# Patient Record
Sex: Female | Born: 1945 | Race: White | Hispanic: No | Smoking: Former smoker
Health system: Southern US, Community
[De-identification: ages and names within clinical notes are randomized; demographics above are authoritative.]

## PROBLEM LIST (undated history)

## (undated) ENCOUNTER — Emergency Department (HOSPITAL_COMMUNITY): Payer: Medicare Other | Source: Home / Self Care

## (undated) DIAGNOSIS — T1491XA Suicide attempt, initial encounter: Secondary | ICD-10-CM

## (undated) DIAGNOSIS — IMO0002 Reserved for concepts with insufficient information to code with codable children: Secondary | ICD-10-CM

## (undated) DIAGNOSIS — E039 Hypothyroidism, unspecified: Secondary | ICD-10-CM

## (undated) DIAGNOSIS — I779 Disorder of arteries and arterioles, unspecified: Secondary | ICD-10-CM

## (undated) DIAGNOSIS — M26629 Arthralgia of temporomandibular joint, unspecified side: Secondary | ICD-10-CM

## (undated) DIAGNOSIS — G43909 Migraine, unspecified, not intractable, without status migrainosus: Secondary | ICD-10-CM

## (undated) DIAGNOSIS — I998 Other disorder of circulatory system: Secondary | ICD-10-CM

## (undated) DIAGNOSIS — E782 Mixed hyperlipidemia: Secondary | ICD-10-CM

## (undated) DIAGNOSIS — G8929 Other chronic pain: Secondary | ICD-10-CM

## (undated) DIAGNOSIS — J90 Pleural effusion, not elsewhere classified: Secondary | ICD-10-CM

## (undated) DIAGNOSIS — I639 Cerebral infarction, unspecified: Secondary | ICD-10-CM

## (undated) DIAGNOSIS — M549 Dorsalgia, unspecified: Secondary | ICD-10-CM

## (undated) DIAGNOSIS — M1990 Unspecified osteoarthritis, unspecified site: Secondary | ICD-10-CM

## (undated) DIAGNOSIS — L0390 Cellulitis, unspecified: Secondary | ICD-10-CM

## (undated) DIAGNOSIS — F32A Depression, unspecified: Secondary | ICD-10-CM

## (undated) DIAGNOSIS — I70229 Atherosclerosis of native arteries of extremities with rest pain, unspecified extremity: Secondary | ICD-10-CM

## (undated) DIAGNOSIS — Z9289 Personal history of other medical treatment: Secondary | ICD-10-CM

## (undated) DIAGNOSIS — I10 Essential (primary) hypertension: Secondary | ICD-10-CM

## (undated) DIAGNOSIS — I739 Peripheral vascular disease, unspecified: Secondary | ICD-10-CM

## (undated) DIAGNOSIS — I509 Heart failure, unspecified: Secondary | ICD-10-CM

## (undated) DIAGNOSIS — I255 Ischemic cardiomyopathy: Secondary | ICD-10-CM

## (undated) DIAGNOSIS — F329 Major depressive disorder, single episode, unspecified: Secondary | ICD-10-CM

## (undated) DIAGNOSIS — I872 Venous insufficiency (chronic) (peripheral): Secondary | ICD-10-CM

## (undated) DIAGNOSIS — J189 Pneumonia, unspecified organism: Secondary | ICD-10-CM

## (undated) DIAGNOSIS — G459 Transient cerebral ischemic attack, unspecified: Secondary | ICD-10-CM

## (undated) DIAGNOSIS — I214 Non-ST elevation (NSTEMI) myocardial infarction: Secondary | ICD-10-CM

## (undated) DIAGNOSIS — E119 Type 2 diabetes mellitus without complications: Secondary | ICD-10-CM

## (undated) DIAGNOSIS — Z9981 Dependence on supplemental oxygen: Secondary | ICD-10-CM

## (undated) DIAGNOSIS — I2510 Atherosclerotic heart disease of native coronary artery without angina pectoris: Secondary | ICD-10-CM

## (undated) DIAGNOSIS — I5032 Chronic diastolic (congestive) heart failure: Secondary | ICD-10-CM

## (undated) HISTORY — DX: Arthralgia of temporomandibular joint, unspecified side: M26.629

## (undated) HISTORY — DX: Chronic diastolic (congestive) heart failure: I50.32

## (undated) HISTORY — PX: COLONOSCOPY W/ BIOPSIES AND POLYPECTOMY: SHX1376

## (undated) HISTORY — DX: Venous insufficiency (chronic) (peripheral): I87.2

## (undated) HISTORY — DX: Unspecified osteoarthritis, unspecified site: M19.90

## (undated) HISTORY — PX: DILATION AND CURETTAGE OF UTERUS: SHX78

## (undated) HISTORY — DX: Disorder of arteries and arterioles, unspecified: I77.9

## (undated) HISTORY — PX: APPENDECTOMY: SHX54

## (undated) HISTORY — DX: Atherosclerosis of native arteries of extremities with rest pain, unspecified extremity: I70.229

## (undated) HISTORY — PX: LOWER EXTREMITY ANGIOGRAM: SHX5955

## (undated) HISTORY — PX: WRIST FRACTURE SURGERY: SHX121

## (undated) HISTORY — DX: Type 2 diabetes mellitus without complications: E11.9

## (undated) HISTORY — DX: Depression, unspecified: F32.A

## (undated) HISTORY — PX: ABDOMINAL HYSTERECTOMY: SHX81

## (undated) HISTORY — DX: Cerebral infarction, unspecified: I63.9

## (undated) HISTORY — DX: Other disorder of circulatory system: I99.8

## (undated) HISTORY — PX: TOE SURGERY: SHX1073

## (undated) HISTORY — DX: Transient cerebral ischemic attack, unspecified: G45.9

## (undated) HISTORY — DX: Suicide attempt, initial encounter: T14.91XA

## (undated) HISTORY — DX: Atherosclerotic heart disease of native coronary artery without angina pectoris: I25.10

## (undated) HISTORY — DX: Heart failure, unspecified: I50.9

## (undated) HISTORY — DX: Mixed hyperlipidemia: E78.2

## (undated) HISTORY — PX: DEBRIDEMENT TOE: SUR397

## (undated) HISTORY — DX: Major depressive disorder, single episode, unspecified: F32.9

## (undated) HISTORY — PX: TUBAL LIGATION: SHX77

## (undated) HISTORY — PX: FRACTURE SURGERY: SHX138

## (undated) HISTORY — DX: Essential (primary) hypertension: I10

## (undated) HISTORY — PX: CHOLECYSTECTOMY: SHX55

## (undated) HISTORY — DX: Cellulitis, unspecified: L03.90

## (undated) HISTORY — DX: Non-ST elevation (NSTEMI) myocardial infarction: I21.4

## (undated) HISTORY — DX: Peripheral vascular disease, unspecified: I73.9

---

## 2000-11-06 ENCOUNTER — Ambulatory Visit (HOSPITAL_COMMUNITY): Admission: RE | Admit: 2000-11-06 | Discharge: 2000-11-06 | Payer: Self-pay | Admitting: Neurosurgery

## 2000-11-06 ENCOUNTER — Encounter: Payer: Self-pay | Admitting: Neurosurgery

## 2000-11-23 ENCOUNTER — Encounter: Admission: RE | Admit: 2000-11-23 | Discharge: 2000-11-23 | Payer: Self-pay | Admitting: Neurosurgery

## 2000-11-23 ENCOUNTER — Encounter: Payer: Self-pay | Admitting: Neurosurgery

## 2000-12-07 ENCOUNTER — Encounter: Payer: Self-pay | Admitting: Neurosurgery

## 2000-12-07 ENCOUNTER — Encounter: Admission: RE | Admit: 2000-12-07 | Discharge: 2000-12-07 | Payer: Self-pay | Admitting: Neurosurgery

## 2000-12-21 ENCOUNTER — Encounter: Payer: Self-pay | Admitting: Neurosurgery

## 2000-12-21 ENCOUNTER — Encounter: Admission: RE | Admit: 2000-12-21 | Discharge: 2000-12-21 | Payer: Self-pay | Admitting: Neurosurgery

## 2002-07-09 ENCOUNTER — Inpatient Hospital Stay (HOSPITAL_COMMUNITY): Admission: EM | Admit: 2002-07-09 | Discharge: 2002-07-10 | Payer: Self-pay | Admitting: Cardiology

## 2002-08-07 ENCOUNTER — Inpatient Hospital Stay (HOSPITAL_COMMUNITY): Admission: EM | Admit: 2002-08-07 | Discharge: 2002-08-11 | Payer: Self-pay | Admitting: Psychiatry

## 2002-08-10 DIAGNOSIS — T1491XA Suicide attempt, initial encounter: Secondary | ICD-10-CM

## 2002-08-10 HISTORY — DX: Suicide attempt, initial encounter: T14.91XA

## 2002-09-10 ENCOUNTER — Inpatient Hospital Stay (HOSPITAL_COMMUNITY): Admission: EM | Admit: 2002-09-10 | Discharge: 2002-09-15 | Payer: Self-pay | Admitting: Psychiatry

## 2009-01-09 HISTORY — PX: CAROTID ENDARTERECTOMY: SUR193

## 2009-08-09 DIAGNOSIS — I639 Cerebral infarction, unspecified: Secondary | ICD-10-CM

## 2009-08-09 HISTORY — DX: Cerebral infarction, unspecified: I63.9

## 2009-08-31 ENCOUNTER — Encounter: Payer: Self-pay | Admitting: Cardiology

## 2009-08-31 ENCOUNTER — Ambulatory Visit: Payer: Self-pay | Admitting: Vascular Surgery

## 2009-09-03 ENCOUNTER — Encounter: Payer: Self-pay | Admitting: Cardiology

## 2009-09-08 ENCOUNTER — Inpatient Hospital Stay (HOSPITAL_COMMUNITY): Admission: RE | Admit: 2009-09-08 | Discharge: 2009-09-09 | Payer: Self-pay | Admitting: Vascular Surgery

## 2009-09-08 ENCOUNTER — Encounter: Payer: Self-pay | Admitting: Vascular Surgery

## 2009-09-08 ENCOUNTER — Ambulatory Visit: Payer: Self-pay | Admitting: Vascular Surgery

## 2009-10-05 ENCOUNTER — Ambulatory Visit: Payer: Self-pay | Admitting: Vascular Surgery

## 2009-10-12 ENCOUNTER — Encounter: Payer: Self-pay | Admitting: Cardiology

## 2009-10-13 ENCOUNTER — Ambulatory Visit: Payer: Self-pay | Admitting: Vascular Surgery

## 2009-10-13 ENCOUNTER — Inpatient Hospital Stay (HOSPITAL_COMMUNITY): Admission: RE | Admit: 2009-10-13 | Discharge: 2009-10-14 | Payer: Self-pay | Admitting: Vascular Surgery

## 2009-10-13 ENCOUNTER — Encounter: Payer: Self-pay | Admitting: Cardiology

## 2009-10-13 ENCOUNTER — Encounter: Payer: Self-pay | Admitting: Vascular Surgery

## 2009-10-14 ENCOUNTER — Encounter: Payer: Self-pay | Admitting: Cardiology

## 2009-10-23 ENCOUNTER — Encounter: Payer: Self-pay | Admitting: Cardiology

## 2009-10-24 ENCOUNTER — Ambulatory Visit: Payer: Self-pay | Admitting: *Deleted

## 2009-10-24 ENCOUNTER — Encounter: Payer: Self-pay | Admitting: Cardiology

## 2009-10-25 ENCOUNTER — Encounter: Payer: Self-pay | Admitting: Cardiology

## 2009-10-26 ENCOUNTER — Ambulatory Visit: Payer: Self-pay | Admitting: Cardiovascular Disease

## 2009-10-26 ENCOUNTER — Inpatient Hospital Stay (HOSPITAL_COMMUNITY): Admission: AD | Admit: 2009-10-26 | Discharge: 2009-10-28 | Payer: Self-pay | Admitting: Internal Medicine

## 2009-10-26 ENCOUNTER — Encounter: Payer: Self-pay | Admitting: Cardiology

## 2009-10-28 ENCOUNTER — Encounter: Payer: Self-pay | Admitting: Cardiology

## 2009-11-02 ENCOUNTER — Ambulatory Visit: Payer: Self-pay | Admitting: Vascular Surgery

## 2009-11-04 ENCOUNTER — Encounter: Payer: Self-pay | Admitting: Cardiology

## 2009-11-05 ENCOUNTER — Encounter: Payer: Self-pay | Admitting: Cardiology

## 2009-11-09 ENCOUNTER — Encounter: Payer: Self-pay | Admitting: Cardiology

## 2009-11-11 ENCOUNTER — Ambulatory Visit: Payer: Self-pay | Admitting: Cardiology

## 2009-11-11 DIAGNOSIS — F17200 Nicotine dependence, unspecified, uncomplicated: Secondary | ICD-10-CM

## 2009-11-11 DIAGNOSIS — E782 Mixed hyperlipidemia: Secondary | ICD-10-CM

## 2009-11-22 ENCOUNTER — Encounter: Payer: Self-pay | Admitting: Cardiology

## 2009-12-20 ENCOUNTER — Ambulatory Visit: Payer: Self-pay | Admitting: Cardiology

## 2009-12-22 ENCOUNTER — Encounter: Payer: Self-pay | Admitting: Cardiology

## 2010-01-07 ENCOUNTER — Encounter (INDEPENDENT_AMBULATORY_CARE_PROVIDER_SITE_OTHER): Payer: Self-pay | Admitting: *Deleted

## 2010-01-07 ENCOUNTER — Encounter: Payer: Self-pay | Admitting: Cardiology

## 2010-01-14 DIAGNOSIS — I10 Essential (primary) hypertension: Secondary | ICD-10-CM | POA: Insufficient documentation

## 2010-02-08 NOTE — Op Note (Signed)
Summary: Operative Report/ [facility]  Operative Report/ [facility]   Imported By: Bartholomew Boards 11/11/2009 12:13:00  _____________________________________________________________________  External Attachment:    Type:   Image     Comment:   External Document

## 2010-02-08 NOTE — Letter (Signed)
Summary: Discharge Summary  Discharge Summary   Imported By: Bartholomew Boards 11/11/2009 12:14:40  _____________________________________________________________________  External Attachment:    Type:   Image     Comment:   External Document

## 2010-02-08 NOTE — Miscellaneous (Signed)
Summary: Rehab Report/ CARDIAC REHAB PROGRESS REPORT  Rehab Report/ CARDIAC REHAB PROGRESS REPORT   Imported By: Bartholomew Boards 11/22/2009 14:36:01  _____________________________________________________________________  External Attachment:    Type:   Image     Comment:   External Document

## 2010-02-08 NOTE — Assessment & Plan Note (Signed)
Summary: [facility] PER M. SPENCER   Visit Type:  Follow-up Primary [name] [name]:  Dr. Jerene Bears   History of Present Illness: 65 year old woman presents for office followup. She is a previous patient of Dr. Dannielle Burn. I saw her in consultation in [place] in October in the setting of a non-ST elevation myocardial infarction with diastolic heart failure and prior documentation of nonobstructive CAD in 2004 with active cardiac risk factors. She had undergone recent bilateral carotid endarterectomies in September and October by Dr. Kellie Simmering. She was transferred to [facility] for further evaluation. Cardiac catheterization demonstrated diffuse multivessel disease that was felt to be best managed medically.  She saw Dr. Kellie Simmering in followup in late October.  She states that she is feeling "good." No angina, NYHA class II dyspnea on exertion. She states she weighs daily, weights only fluctuate by a few pounds. She has had no orthopnea or PND. No lower extremity edema. She reports compliance with her medications.  She quit smoking back in October. Has had no major urges. Seems fairly resolved.  Preventive Screening-Counseling & Management  Alcohol-Tobacco     Smoking Status: current     Packs/Day: 1&1/2-2 PPD     Year Started: 1956     Year Quit: second week of October had last cigarette     Pack years: 50  Current Medications (verified): 1)  Bupropion Hcl 150 Mg Xr24h-Tab (Bupropion Hcl) .... Take 1 Tablet By Mouth Once A Day 2)  Plavix 75 Mg Tabs (Clopidogrel Bisulfate) .... Take 1 Tablet By Mouth Once A Day 3)  Furosemide 40 Mg Tabs (Furosemide) .... Take 1 Tablet By Mouth Two Times A Day 4)  Lipitor 80 Mg Tabs (Atorvastatin Calcium) .... Take 1 Tablet By Mouth Once A Day 5)  Nicoderm Cq 21 Mg/24hr Pt24 (Nicotine) .... Apply Patch Every 24 Hours 6)  Nitrostat 0.4 Mg Subl (Nitroglycerin) .... Use As Needed Chest Pain 7)  Aspirin 325 Mg Tabs (Aspirin) .... Take 1 Tablet By Mouth Once A  Day 8)  Glimepiride 4 Mg Tabs (Glimepiride) .... Take 1 Tablet By Mouth Two Times A Day 9)  Lantus 100 Unit/ml Soln (Insulin Glargine) .... Use As Directed 10)  Lexapro 10 Mg Tabs (Escitalopram Oxalate) .... Take 1 Tablet By Mouth Once A Day 11)  Lisinopril 20 Mg Tabs (Lisinopril) .... Take 1 Tablet By Mouth Once A Day 12)  Metformin Hcl 500 Mg Tabs (Metformin Hcl) .... Take 1 Tablet By Mouth Two Times A Day 13)  Novolog 100 Unit/ml Soln (Insulin Aspart) .... Use As Directed  Allergies (verified): 1)  ! Sulfa  Comments:  Nurse/Medical Assistant: The patient's medication bottles and allergies were reviewed with the patient and were updated in the Medication and Allergy Lists.  Past History:  Family History: Last updated: 2009/11/17 Father: died age 38 with MI Mother: diabetes, hypertension Siblings: sister with CHF, brother with lung cancer  Social History: Last updated: 11/17/2009 Tobacco Use - Yes Alcohol Use - yes (occasional)  Past Medical History: CAD - multivessel Diastolic CHF, LVEF 56% Diabetes Type 2 Hyperlipidemia Hypertension PVD - bilateral carotid endarterectomies Depression Recurrent cellulitis of the legs Arthritis  Past Surgical History: Appendectomy Cholecystectomy Hysterectomy Bilateral carotid endarterectomies - Dr. Kellie Simmering  Family History: Father: died age 15 with MI Mother: diabetes, hypertension Siblings: sister with CHF, brother with lung cancer  Social History: Tobacco Use - Yes Alcohol Use - yes (occasional) Smoking Status:  current Packs/Day:  1&1/2-2 PPD Pack years:  50  Review of  Systems  The patient denies anorexia, fever, weight loss, chest pain, syncope, dyspnea on exertion, peripheral edema, melena, and hematochezia.         Otherwise reviewed and negative.  Vital Signs:  Patient profile:   65 year old female Height:      67 inches Weight:      197 pounds BMI:     30.97 Pulse rate:   71 / minute BP sitting:   111 /  61  (right arm) Cuff size:   large  Vitals Entered By: Georgina Peer (November 11, 2009 2:28 PM)  Nutrition Counseling: Patient's BMI is greater than 25 and therefore counseled on weight management options.  Physical Exam  Additional Exam:  Chronically ill-appearing overweight woman in no acute distress. HEENT: Conjunctiva and lids normal, oropharynx with poor dentition. Neck: Supple, bilateral carotid endarterectomy scars, no thyromegaly. Lungs: Decreased breath sounds, no rales or wheezing. Nonlabored. Cardiac: Regular rate and rhythm, no S3 gallop. Abdomen: Soft, nontender, bowel sounds present. Skin: Warm and dry, stasis noted distally.  Extremities: No pitting edema, distal pulses one plus. Musculoskeletal: No kyphosis. Neuropsychiatric: Alert and oriented x3, affect grossly appropriate.   Echocardiogram  Procedure date:  10/25/2009  Findings:      Normal LV chamber size with mild to moderate LVH, increased LV filling pressures, LVEF 55% with slight hypokinesis of the septum, mild left atrial enlargement, mild mitral regurgitation, trace tricuspid regurgitation.  Cardiac Cath  Procedure date:  10/27/2009  Findings:       PROCEDURAL FINDINGS:  Right atrial pressure mean of 6.  Right   ventricular pressure is 40/9.  Pulmonary artery pressure is 44/12 with a   mean of 29.  Pulmonary capillary wedge pressure is a mean of 14.      Oxygen saturations:  Pulmonary artery 62%, central aorta is 91%.   Cardiac output by the Fick method is 4.4 liters per minute.  Cardiac   index is 3 liters per minute per sq. m.      Left ventriculography shows mild global hypokinesis with an LVEF   estimated at 50%.  There are no regional wall motion abnormalities   visualized.      Coronary angiography:  The LAD has a separate ostium from the   circumflex.  The LAD reaches the LV apex.  The vessel was diffusely   diseased.  There is 60% proximal stenosis, 70% diffuse mid stenosis.   There  is a second diagonal branch that supplies a large territory of   myocardium.  It is also diffusely diseased and has 80% stenosis.      Left circumflex:  The left circumflex is small and diffusely diseased.   The proximal circumflex has 70% tandem stenoses.  The distal circumflex   has a 75% stenosis.  The entire vessel is of small caliber.  There is   significant pressure dampening with a catheter into the circumflex.      Right coronary artery:  The right coronary artery is also small and   diffusely diseased.  There is 60% proximal stenosis, 50% mid stenosis.   The distal vessel is patent gives off a PDA and posterolateral branch.   The right coronary artery also had significant pressure dampening with a   catheter into its ostium.  Impression & Recommendations:  Problem # 1:  CORONARY ATHEROSCLEROSIS NATIVE CORONARY ARTERY (ICD-414.01)  Multivessel disease as noted above, felt to be best managed medically. She is status post non-ST elevation myocardial infarction following  recent carotid endarterectomies. At this point symptomatically stable. I would like to see her back over the next 6 weeks, sooner if needed. No medication changes were made today.  Her updated medication list for this problem includes:    Plavix 75 Mg Tabs (Clopidogrel bisulfate) .[name]... Take 1 tablet by mouth once a day    Nitrostat 0.4 Mg Subl (Nitroglycerin) ..... Use as needed chest pain    Aspirin 325 Mg Tabs (Aspirin) .[name]... Take 1 tablet by mouth once a day    Lisinopril 20 Mg Tabs (Lisinopril) .[name]... Take 1 tablet by mouth once a day  Problem # 2:  CHRONIC DIASTOLIC HEART FAILURE (UTM-546.50)  Patient appears relatively euvolemic. Breathing status is stable. Weight has had no major fluctuations. Continue present diuretic regimen. Followup BMET prior to next visit.  Her updated medication list for this problem includes:    Plavix 75 Mg Tabs (Clopidogrel bisulfate) .[name]... Take 1 tablet by mouth once a day     Furosemide 40 Mg Tabs (Furosemide) .[name]... Take 1 tablet by mouth two times a day    Nitrostat 0.4 Mg Subl (Nitroglycerin) ..... Use as needed chest pain    Aspirin 325 Mg Tabs (Aspirin) .[name]... Take 1 tablet by mouth once a day    Lisinopril 20 Mg Tabs (Lisinopril) .[name]... Take 1 tablet by mouth once a day  Problem # 3:  TOBACCO ABUSE (ICD-305.1)  Patient quit smoking back in October. Seems to be handling urges well, good resolve. Has used nicotine patches.  Problem # 4:  MIXED HYPERLIPIDEMIA (ICD-272.2)  Followup fasting lipid profile and liver function tests for her next visit  Her updated medication list for this problem includes:    Lipitor 80 Mg Tabs (Atorvastatin calcium) .[name]... Take 1 tablet by mouth once a day  Future Orders: T-Hepatic Function [id number]) ... 12/09/2009 T-Lipid Profile [id number]) ... 49/67/5916 T-Basic Metabolic Panel ([id number]) ... 12/09/2009  Patient Instructions: 1)  Follow up with Dr. Domenic Polite on Monday, December 20, 2009 at 10:40pm. 2)  Your physician recommends that you go to the [facility] for lab work: DO [place] DECEMBER 12 OFFICE VISIT. Do not eat or drink after midnight.

## 2010-02-08 NOTE — Miscellaneous (Signed)
Summary: Home Care Report/ [place] Report/ [facility]   Imported By: Bartholomew Boards 11/12/2009 15:03:02  _____________________________________________________________________  External Attachment:    Type:   Image     Comment:   External Document

## 2010-02-08 NOTE — Consult Note (Signed)
Summary: CARDIOLOGY CONSULT/ [place] CONSULT/ [place]   Imported By: Delfino Lovett 11/11/2009 11:54:00  _____________________________________________________________________  External Attachment:    Type:   Image     Comment:   External Document

## 2010-02-08 NOTE — Miscellaneous (Signed)
Summary: Rehab Report/ FAXED CARDIAC REHAB REFERRAL  Rehab Report/ FAXED CARDIAC REHAB REFERRAL   Imported By: Bartholomew Boards 11/12/2009 14:52:15  _____________________________________________________________________  External Attachment:    Type:   Image     Comment:   External Document

## 2010-02-08 NOTE — Progress Notes (Signed)
Summary: Office Visit/ [facility] OFFICE VISIT  Office Visit/ [facility] OFFICE VISIT   Imported By: Bartholomew Boards 11/11/2009 12:09:29  _____________________________________________________________________  External Attachment:    Type:   Image     Comment:   External Document

## 2010-02-08 NOTE — Miscellaneous (Signed)
Summary: Home Care Report/ [place] Report/ [facility]   Imported By: Bartholomew Boards 11/15/2009 13:59:44  _____________________________________________________________________  External Attachment:    Type:   Image     Comment:   External Document

## 2010-02-10 NOTE — Assessment & Plan Note (Signed)
Summary: 6 WK F/U PER REMINDER-JM   Visit Type:  Follow-up Primary Provider:  Dr. Jerene Bears   History of Present Illness: 65 year old woman presents for followup. She was seen back in November. Reports no problems with recurrent angina or progressive shortness of breath. Main complaint is of lower back and leg pain, with plans to see an orthopedic physician in [place] soon.  She has tolerated her medication adjustments aimed at better control of hypertension and angina symptoms.  Followup labs from 30 December showed BUN 23, creatinine 0.9, sodium 140, potassium 3.9, AST 22, ALT 20, cholesterol 129, triglycerides 146, HDL 36, LDL 64. I reviewed these with her today.  No reported palpitations, no syncope. No significant lower extremity edema.  Preventive Screening-Counseling & Management  Alcohol-Tobacco     Smoking Status: quit     Year Quit: 10/2009  Current Medications (verified): 1)  Bupropion Hcl 150 Mg Xr24h-Tab (Bupropion Hcl) .... Take 1 Tablet By Mouth Once A Day 2)  Plavix 75 Mg Tabs (Clopidogrel Bisulfate) .... Take 1 Tablet By Mouth Once A Day 3)  Furosemide 40 Mg Tabs (Furosemide) .... Take 1 Tablet By Mouth Two Times A Day 4)  Lipitor 80 Mg Tabs (Atorvastatin Calcium) .... Take 1 Tablet By Mouth Once A Day 5)  Nicoderm Cq 21 Mg/24hr Pt24 (Nicotine) .... Apply Patch Every 24 Hours 6)  Nitrostat 0.4 Mg Subl (Nitroglycerin) .... Use As Needed Chest Pain 7)  Aspirin 325 Mg Tabs (Aspirin) .... Take 1 Tablet By Mouth Once A Day 8)  Glimepiride 4 Mg Tabs (Glimepiride) .... Take 1 Tablet By Mouth Two Times A Day 9)  Lantus 100 Unit/ml Soln (Insulin Glargine) .... Use As Directed 10)  Lexapro 10 Mg Tabs (Escitalopram Oxalate) .... Take 1 Tablet By Mouth Once A Day 11)  Lisinopril 40 Mg Tabs (Lisinopril) .... Take 1 Tablet By Mouth Once A Day 12)  Metformin Hcl 500 Mg Tabs (Metformin Hcl) .... Take 1 Tablet By Mouth Two Times A Day 13)  Novolog 100 Unit/ml Soln (Insulin  Aspart) .... Use As Directed  Allergies (verified): 1)  ! Sulfa  Comments:  Nurse/Medical Assistant: The patient is currently on medications but does not know the name or dosage at this time. Instructed to contact our office with details. Will update medication list at that time.  Past History:  Social History: Last updated: 01/14/2010 Tobacco Use - Yes Alcohol Use - yes (occasional) Smoking Status:  current Packs/Day:  1&1/2-2 PPD Pack years:  44     Past Medical History: CAD - multivessel Diastolic CHF, LVEF 82% Diabetes Type 2 Hyperlipidemia Hypertension PVD - bilateral carotid endarterectomies Depression Recurrent cellulitis of the legs Arthritis  Past Surgical History: Appendectomy Cholecystectomy Hysterectomy Bilateral carotid endarterectomies - Dr. Kellie Simmering     Family History: Father: died age 80 with MI Mother: diabetes, hypertension Siblings: sister with CHF, brother with lung cancer  Social History: Tobacco Use - Yes Alcohol Use - yes (occasional) Smoking Status:  current Packs/Day:  1&1/2-2 PPD Pack years:  50    Smoking Status:  quit  Review of Systems  The patient denies anorexia, fever, weight loss, chest pain, syncope, dyspnea on exertion, peripheral edema, headaches, hemoptysis, melena, and hematochezia.         Otherwise reviewed and negative except as outlined.  Vital Signs:  Patient profile:   65 year old female Height:      67 inches Weight:      204 pounds Pulse rate:   69 /  minute BP sitting:   163 / 72  (left arm) Cuff size:   large  Vitals Entered By: Georgina Peer (January 14, 2010 1:13 PM)  Physical Exam  Additional Exam:  Chronically ill-appearing overweight woman in no acute distress. HEENT: Conjunctiva and lids normal, oropharynx with poor dentition. Neck: Supple, bilateral carotid endarterectomy scars, very soft left bruit, no thyromegaly. Lungs: Decreased breath sounds, no rales or wheezing. Nonlabored. Cardiac:  Regular rate and rhythm, no S3 gallop. Abdomen: Soft, nontender, bowel sounds present. Skin: Warm and dry, stasis noted distally.  Extremities: No pitting edema, distal pulses one plus. Musculoskeletal: No kyphosis. Neuropsychiatric: Alert and oriented x3, affect grossly appropriate.   Prior Report Reviewed for Cardiac Cath:  Findings: 10/27/2009  PROCEDURAL FINDINGS:  Right atrial pressure mean of 6.  Right   ventricular pressure is 40/9.  Pulmonary artery pressure is 44/12 with a   mean of 29.  Pulmonary capillary wedge pressure is a mean of 14.      Oxygen saturations:  Pulmonary artery 62%, central aorta is 91%.   Cardiac output by the Fick method is 4.4 liters per minute.  Cardiac   index is 3 liters per minute per sq. m.      Left ventriculography shows mild global hypokinesis with an LVEF   estimated at 50%.  There are no regional wall motion abnormalities   visualized.      Coronary angiography:  The LAD has a separate ostium from the   circumflex.  The LAD reaches the LV apex.  The vessel was diffusely   diseased.  There is 60% proximal stenosis, 70% diffuse mid stenosis.   There is a second diagonal branch that supplies a large territory of   myocardium.  It is also diffusely diseased and has 80% stenosis.      Left circumflex:  The left circumflex is small and diffusely diseased.   The proximal circumflex has 70% tandem stenoses.  The distal circumflex   has a 75% stenosis.  The entire vessel is of small caliber.  There is   significant pressure dampening with a catheter into the circumflex.      Right coronary artery:  The right coronary artery is also small and   diffusely diseased.  There is 60% proximal stenosis, 50% mid stenosis.   The distal vessel is patent gives off a PDA and posterolateral branch.   The right coronary artery also had significant pressure dampening with a   catheter into its ostium.  Comments:    Impression & Recommendations:  Problem  # 1:  CORONARY ATHEROSCLEROSIS NATIVE CORONARY ARTERY (ICD-414.01)  Symptomatically stable on medical therapy. Plan followup in 3 months.  Her updated medication list for this problem includes:    Plavix 75 Mg Tabs (Clopidogrel bisulfate) .[name]... Take 1 tablet by mouth once a day    Nitrostat 0.4 Mg Subl (Nitroglycerin) ..... Use as needed chest pain    Aspirin 325 Mg Tabs (Aspirin) .[name]... Take 1 tablet by mouth once a day    Lisinopril 40 Mg Tabs (Lisinopril) .[name]... Take 1 tablet by mouth once a day  Her updated medication list for this problem includes:    Plavix 75 Mg Tabs (Clopidogrel bisulfate) .[name]... Take 1 tablet by mouth once a day    Nitrostat 0.4 Mg Subl (Nitroglycerin) ..... Use as needed chest pain    Aspirin 325 Mg Tabs (Aspirin) .[name]... Take 1 tablet by mouth once a day    Lisinopril 40 Mg Tabs (  Lisinopril) .[name]... Take 1 tablet by mouth once a day  Future Orders: T-Basic Metabolic Panel ([id number]) ... 01/28/2010  Problem # 2:  ESSENTIAL HYPERTENSION, BENIGN (ICD-401.1)  Not yet optimally controlled. Advance lisinopril to 40 mg daily, followup BMET in 2 weeks.  Her updated medication list for this problem includes:    Furosemide 40 Mg Tabs (Furosemide) .[name]... Take 1 tablet by mouth two times a day    Aspirin 325 Mg Tabs (Aspirin) .[name]... Take 1 tablet by mouth once a day    Lisinopril 40 Mg Tabs (Lisinopril) .[name]... Take 1 tablet by mouth once a day  Problem # 3:  CHRONIC DIASTOLIC HEART FAILURE (PDF-009.20)  No progressive shortness of breath. Patient appears euvolemic.  Her updated medication list for this problem includes:    Plavix 75 Mg Tabs (Clopidogrel bisulfate) .[name]... Take 1 tablet by mouth once a day    Furosemide 40 Mg Tabs (Furosemide) .[name]... Take 1 tablet by mouth two times a day    Nitrostat 0.4 Mg Subl (Nitroglycerin) ..... Use as needed chest pain    Aspirin 325 Mg Tabs (Aspirin) .[name]... Take 1 tablet by mouth once a day    Lisinopril 40 Mg Tabs (Lisinopril) .[name]...  Take 1 tablet by mouth once a day  Problem # 4:  MIXED HYPERLIPIDEMIA (ICD-272.2)  Lipids well-controlled at last check.  Her updated medication list for this problem includes:    Lipitor 80 Mg Tabs (Atorvastatin calcium) .[name]... Take 1 tablet by mouth once a day  Patient Instructions: 1)  Increase Lisinopril to 44m daily  2)  Labs:  BMET - do in 2 weeks after change above 3)  Follow up in  3 months Prescriptions: LISINOPRIL 40 MG TABS (LISINOPRIL) Take 1 tablet by mouth once a day  #30 x 6   Entered by:   GLovina Reach LPN   Authorized by:   SBeckie Salts MD, [facility]  Signed by:   GLovina Reach LPN on 004/15/9301  Method used:   Electronically to        [facility])       [address]      [place]  [address]      Ph: [phone]      Fax: [phone]  RxID:   [id number]

## 2010-02-10 NOTE — Miscellaneous (Signed)
Summary: Rehab Report/ CARDIAC REHAB PROGRESS REPORT  Rehab Report/ CARDIAC REHAB PROGRESS REPORT   Imported By: Bartholomew Boards 12/29/2009 14:08:49  _____________________________________________________________________  External Attachment:    Type:   Image     Comment:   External Document

## 2010-02-10 NOTE — Letter (Signed)
Summary: Generic Doctor, general practice at [place]. [address] [place], [place] [address]   Phone: [phone]  Fax: [phone]        January 07, 2010 MRN: [id number]    [facility] [address] [place], [place]  [address]    Dear Ms. Mones,   According to our records, you are due for lab work before your January 6th office visit.   Please take the enclosed order to the [facility] before this appointment date to have lab work done.   Do not eat or drink after midnight.       Sincerely,  Gurney Maxin, RN, BSN  This letter has been electronically signed by your physician.

## 2010-02-21 ENCOUNTER — Encounter (INDEPENDENT_AMBULATORY_CARE_PROVIDER_SITE_OTHER): Payer: Self-pay | Admitting: *Deleted

## 2010-02-28 ENCOUNTER — Encounter: Payer: Self-pay | Admitting: Cardiology

## 2010-03-02 NOTE — Letter (Signed)
Summary: Generic Doctor, general practice at [place]. [address]   [place], [place] [address]   Phone: [phone]  Fax: [phone]        February 21, 2010 MRN: [id number]    [facility] [place], [place], [place]  [address]    Dear Ms. Bugbee,  You were asked to have lab work done in January. However, it does not appear this has been done yet.  Please, take the enclosed order to the [facility] at your earliest convenience to have this done. If you will not be able to do your lab work at this time, please notify our office so that we can properly document this in  your chart.         Sincerely,  Gurney Maxin, RN, BSN  This letter has been electronically signed by your physician.

## 2010-03-17 NOTE — Miscellaneous (Signed)
Summary: Rehab Report/ CARDIAC REHAB DISCHARGE SUMMARY  Rehab Report/ CARDIAC REHAB DISCHARGE SUMMARY   Imported By: Bartholomew Boards 03/08/2010 16:43:07  _____________________________________________________________________  External Attachment:    Type:   Image     Comment:   External Document

## 2010-03-23 LAB — BASIC METABOLIC PANEL
BUN: 20 mg/dL (ref 6–23)
CO2: 31 mEq/L (ref 19–32)
CO2: 32 mEq/L (ref 19–32)
Calcium: 8.8 mg/dL (ref 8.4–10.5)
Calcium: 9.4 mg/dL (ref 8.4–10.5)
Chloride: 98 mEq/L (ref 96–112)
Creatinine, Ser: 0.76 mg/dL (ref 0.4–1.2)
Creatinine, Ser: 0.87 mg/dL (ref 0.4–1.2)
GFR calc Af Amer: >60 mL/min (ref >60)
GFR calc non Af Amer: >60 mL/min (ref >60)
GFR calc non Af Amer: >60 mL/min (ref >60)
Glucose, Bld: 129 mg/dL — ABNORMAL HIGH (ref 70–99)
Glucose, Bld: 62 mg/dL — ABNORMAL LOW (ref 70–99)
Potassium: 4 mEq/L (ref 3.5–5.1)
Sodium: 139 mEq/L (ref 135–145)
Sodium: 133 mEq/L — ABNORMAL LOW (ref 135–145)

## 2010-03-23 LAB — CBC
HCT: 33.8 % — ABNORMAL LOW (ref 36.0–46.0)
Hemoglobin: 11.1 g/dL — ABNORMAL LOW (ref 12.0–15.0)
MCH: 27.1 pg (ref 26.0–34.0)
MCH: 28.8 pg (ref 26.0–34.0)
MCHC: 32.1 g/dL (ref 30.0–36.0)
MCV: 84.7 fL (ref 78.0–100.0)
Platelets: 294 10*3/uL (ref 150–400)
Platelets: 206 10*3/uL (ref 150–400)
RDW: 14.1 % (ref 11.5–15.5)
RDW: 13.8 % (ref 11.5–15.5)
RDW: 13.9 % (ref 11.5–15.5)
WBC: 11 10*3/uL — ABNORMAL HIGH (ref 4.0–10.5)
WBC: 7.7 10*3/uL (ref 4.0–10.5)

## 2010-03-23 LAB — GLUCOSE, CAPILLARY
Glucose-Capillary: 141 mg/dL — ABNORMAL HIGH (ref 70–99)
Glucose-Capillary: 153 mg/dL — ABNORMAL HIGH (ref 70–99)
Glucose-Capillary: 158 mg/dL — ABNORMAL HIGH (ref 70–99)
Glucose-Capillary: 211 mg/dL — ABNORMAL HIGH (ref 70–99)
Glucose-Capillary: 213 mg/dL — ABNORMAL HIGH (ref 70–99)
Glucose-Capillary: 53 mg/dL — ABNORMAL LOW (ref 70–99)
Glucose-Capillary: 83 mg/dL (ref 70–99)
Glucose-Capillary: 83 mg/dL (ref 70–99)
Glucose-Capillary: 119 mg/dL — ABNORMAL HIGH (ref 70–99)
Glucose-Capillary: 147 mg/dL — ABNORMAL HIGH (ref 70–99)
Glucose-Capillary: 254 mg/dL — ABNORMAL HIGH (ref 70–99)

## 2010-03-23 LAB — APTT: aPTT: 28 seconds (ref 24–37)

## 2010-03-23 LAB — CROSSMATCH
ABO/RH(D): O POS
Antibody Screen: NEGATIVE

## 2010-03-23 LAB — COMPREHENSIVE METABOLIC PANEL
Albumin: 3.9 g/dL (ref 3.5–5.2)
BUN: 23 mg/dL (ref 6–23)
Creatinine, Ser: 0.81 mg/dL (ref 0.4–1.2)
Total Protein: 7.2 g/dL (ref 6.0–8.3)

## 2010-03-23 LAB — POCT I-STAT 3, VENOUS BLOOD GAS (G3P V)
Acid-Base Excess: 5 mmol/L — ABNORMAL HIGH (ref 0.0–2.0)
Bicarbonate: 32.3 mEq/L — ABNORMAL HIGH (ref 20.0–24.0)
O2 Saturation: 62 %
pO2, Ven: 34 mmHg (ref 30.0–45.0)

## 2010-03-23 LAB — BRAIN NATRIURETIC PEPTIDE: Pro B Natriuretic peptide (BNP): 620 pg/mL — ABNORMAL HIGH (ref 0.0–100.0)

## 2010-03-23 LAB — POCT I-STAT 3, ART BLOOD GAS (G3+)
O2 Saturation: 91 %
TCO2: 34 mmol/L (ref 0–100)
pCO2 arterial: 52.7 mmHg — ABNORMAL HIGH (ref 35.0–45.0)
pH, Arterial: 7.403 — ABNORMAL HIGH (ref 7.350–7.400)

## 2010-03-23 LAB — CK TOTAL AND CKMB (NOT AT ARMC)
Relative Index: INVALID (ref 0.0–2.5)
Total CK: 34 U/L (ref 7–177)

## 2010-03-23 LAB — PROTIME-INR: INR: 0.94 (ref 0.00–1.49)

## 2010-03-23 LAB — LIPID PANEL
HDL: 26 mg/dL — ABNORMAL LOW (ref >39)
Total CHOL/HDL Ratio: 6.1 RATIO
VLDL: 32 mg/dL (ref 0–40)

## 2010-03-23 LAB — URINALYSIS, ROUTINE W REFLEX MICROSCOPIC
Nitrite: POSITIVE — AB
Specific Gravity, Urine: 1.019 (ref 1.005–1.030)
Urobilinogen, UA: 0.2 mg/dL (ref 0.0–1.0)
pH: 5.5 (ref 5.0–8.0)

## 2010-03-23 LAB — SURGICAL PCR SCREEN: Staphylococcus aureus: NEGATIVE

## 2010-03-23 LAB — URINE MICROSCOPIC-ADD ON

## 2010-03-24 LAB — CROSSMATCH

## 2010-03-24 LAB — URINE MICROSCOPIC-ADD ON

## 2010-03-24 LAB — CBC
HCT: 35.9 % — ABNORMAL LOW (ref 36.0–46.0)
HCT: 44.1 % (ref 36.0–46.0)
Hemoglobin: 15.7 g/dL — ABNORMAL HIGH (ref 12.0–15.0)
MCHC: 35.6 g/dL (ref 30.0–36.0)
MCV: 82.4 fL (ref 78.0–100.0)
Platelets: 181 10*3/uL (ref 150–400)
RDW: 13.3 % (ref 11.5–15.5)
RDW: 13.1 % (ref 11.5–15.5)
WBC: 12 10*3/uL — ABNORMAL HIGH (ref 4.0–10.5)

## 2010-03-24 LAB — GLUCOSE, CAPILLARY
Glucose-Capillary: 162 mg/dL — ABNORMAL HIGH (ref 70–99)
Glucose-Capillary: 184 mg/dL — ABNORMAL HIGH (ref 70–99)
Glucose-Capillary: 209 mg/dL — ABNORMAL HIGH (ref 70–99)

## 2010-03-24 LAB — BASIC METABOLIC PANEL
BUN: 13 mg/dL (ref 6–23)
Calcium: 8 mg/dL — ABNORMAL LOW (ref 8.4–10.5)
Creatinine, Ser: 0.73 mg/dL (ref 0.4–1.2)
GFR calc non Af Amer: >60 mL/min (ref >60)
Potassium: 3.7 mEq/L (ref 3.5–5.1)

## 2010-03-24 LAB — COMPREHENSIVE METABOLIC PANEL
Alkaline Phosphatase: 65 U/L (ref 39–117)
BUN: 21 mg/dL (ref 6–23)
CO2: 30 mEq/L (ref 19–32)
Calcium: 9.4 mg/dL (ref 8.4–10.5)
GFR calc non Af Amer: >60 mL/min (ref >60)
Glucose, Bld: 116 mg/dL — ABNORMAL HIGH (ref 70–99)
Total Protein: 7.4 g/dL (ref 6.0–8.3)

## 2010-03-24 LAB — URINALYSIS, ROUTINE W REFLEX MICROSCOPIC
Glucose, UA: >1000 mg/dL — AB
Ketones, ur: NEGATIVE mg/dL
Leukocytes, UA: NEGATIVE
Nitrite: NEGATIVE
Specific Gravity, Urine: 1.031 — ABNORMAL HIGH (ref 1.005–1.030)
pH: 5.5 (ref 5.0–8.0)

## 2010-03-24 LAB — PROTIME-INR
INR: 0.97 (ref 0.00–1.49)
Prothrombin Time: 13.1 seconds (ref 11.6–15.2)

## 2010-03-24 LAB — SURGICAL PCR SCREEN
MRSA, PCR: NEGATIVE
Staphylococcus aureus: POSITIVE — AB

## 2010-03-24 LAB — ABO/RH: ABO/RH(D): O POS

## 2010-04-05 ENCOUNTER — Encounter: Payer: Self-pay | Admitting: Cardiology

## 2010-04-07 ENCOUNTER — Encounter: Payer: Self-pay | Admitting: *Deleted

## 2010-04-26 ENCOUNTER — Other Ambulatory Visit: Payer: Self-pay

## 2010-04-26 ENCOUNTER — Ambulatory Visit: Payer: Self-pay | Admitting: Vascular Surgery

## 2010-05-02 ENCOUNTER — Encounter: Payer: Self-pay | Admitting: Cardiology

## 2010-05-03 ENCOUNTER — Ambulatory Visit: Payer: Self-pay | Admitting: Cardiology

## 2010-05-17 ENCOUNTER — Encounter: Payer: Self-pay | Admitting: Cardiology

## 2010-05-18 ENCOUNTER — Ambulatory Visit (INDEPENDENT_AMBULATORY_CARE_PROVIDER_SITE_OTHER): Payer: Medicare Other | Admitting: Physician Assistant

## 2010-05-18 ENCOUNTER — Encounter: Payer: Self-pay | Admitting: Cardiology

## 2010-05-18 DIAGNOSIS — I5032 Chronic diastolic (congestive) heart failure: Secondary | ICD-10-CM

## 2010-05-18 DIAGNOSIS — E782 Mixed hyperlipidemia: Secondary | ICD-10-CM

## 2010-05-18 DIAGNOSIS — I2510 Atherosclerotic heart disease of native coronary artery without angina pectoris: Secondary | ICD-10-CM

## 2010-05-18 DIAGNOSIS — I10 Essential (primary) hypertension: Secondary | ICD-10-CM

## 2010-05-18 DIAGNOSIS — I509 Heart failure, unspecified: Secondary | ICD-10-CM

## 2010-05-18 MED ORDER — ASPIRIN 325 MG PO TABS: ORAL_TABLET | ORAL | Status: DC

## 2010-05-18 MED ORDER — FUROSEMIDE 40 MG PO TABS: 40.0000 mg | ORAL_TABLET | Freq: Every day | ORAL | Status: DC

## 2010-05-18 NOTE — Assessment & Plan Note (Signed)
Pt has requested decreasing Lasix dose, due to frequent urination at night. Will decrease to 40 mg daily, and monitor closely for signs/symptoms of A/C DHF. Currently euvolemic by history and exam.

## 2010-05-18 NOTE — Assessment & Plan Note (Addendum)
Stable on current medication regimen. Decrease ASA to 81 mg daily, in conjunction with Plavix. Return visit in 6 months, with Dr Domenic Polite.

## 2010-05-18 NOTE — Patient Instructions (Signed)
   Decrease Aspirin to 153m daily, then to 876mdaily  Decrease Lasix to 403maily Your physician wants you to follow up in: 6 months.  You will receive a reminder letter in the mail one-two months in advance.  If you don't receive a letter, please call our office to schedule the follow up appointment

## 2010-05-18 NOTE — Assessment & Plan Note (Signed)
Improved, following uptitration of Lisinopril at time of last visit. Will reassess at time of next visit. Would consider adding Norvasc or HCTZ, if BP remains elevated.

## 2010-05-18 NOTE — Progress Notes (Signed)
Clinical Summary Ms. Dentremont is a 65 y.o.female presenting for followup. She was seen in January 2012 and missed her last scheduled followup visit.  Pt denies any interim development of exertional CP or DOE, since her last visit. She notes overall improvement in BP readings at home, following uptitration of her Lisinopril dose. Followup labs indicated NL renal fxn.  Allergies  Allergen Reactions  . Sulfonamide Derivatives     REACTION: SWELLING    Current outpatient prescriptions:aspirin 325 MG tablet, Take 1/2 tab (133m) daily, Disp: , Rfl: ;  atorvastatin (LIPITOR) 80 MG tablet, Take 80 mg by mouth daily.  , Disp: , Rfl: ;  buPROPion (WELLBUTRIN XL) 150 MG 24 hr tablet, Take 150 mg by mouth daily.  , Disp: , Rfl: ;  clopidogrel (PLAVIX) 75 MG tablet, Take 75 mg by mouth daily.  , Disp: , Rfl: ;  escitalopram (LEXAPRO) 10 MG tablet, Take 10 mg by mouth daily.  , Disp: , Rfl:  furosemide (LASIX) 40 MG tablet, Take 1 tablet (40 mg total) by mouth daily., Disp: , Rfl: ;  glimepiride (AMARYL) 4 MG tablet, Take 4 mg by mouth 2 (two) times daily.  , Disp: , Rfl: ;  insulin aspart (NOVOLOG) 100 UNIT/ML injection, Inject into the skin as directed.  , Disp: , Rfl: ;  insulin glargine (LANTUS) 100 UNIT/ML injection, Inject into the skin at bedtime.  , Disp: , Rfl:  lisinopril (PRINIVIL,ZESTRIL) 40 MG tablet, Take 40 mg by mouth daily.  , Disp: , Rfl: ;  metFORMIN (GLUCOPHAGE) 500 MG tablet, Take 500 mg by mouth 2 (two) times daily with a meal.  , Disp: , Rfl: ;  nitroGLYCERIN (NITROSTAT) 0.4 MG SL tablet, Place 0.4 mg under the tongue every 5 (five) minutes as needed.  , Disp: , Rfl: ;  DISCONTD: aspirin 325 MG tablet, Take 325 mg by mouth daily.  , Disp: , Rfl:  DISCONTD: furosemide (LASIX) 40 MG tablet, Take 40 mg by mouth 2 (two) times daily.  , Disp: , Rfl: ;  DISCONTD: nicotine (NICODERM CQ - DOSED IN MG/24 HOURS) 21 mg/24hr patch, Place 1 patch onto the skin daily.  , Disp: , Rfl:   Past Medical History    Diagnosis Date  . Coronary atherosclerosis of native coronary artery     Multivessel  . Chronic diastolic heart failure     LVEF 55%  . Type 2 diabetes mellitus   . Mixed hyperlipidemia   . Essential hypertension, benign   . Carotid artery disease   . Depression   . Cellulitis     Recurrent, bilateral legs  . Arthritis     Social History Ms. Walters reports that she quit smoking about 7 months ago. Her smoking use included Cigarettes. She has a 100 pack-year smoking history. She has never used smokeless tobacco. Ms. SDobbinsreports that she drinks alcohol.  Review of Systems  The patient denies fatigue, malaise, fever, weight gain/loss, vision loss, decreased hearing, hoarseness, chest pain, palpitations, shortness of breath, prolonged cough, wheezing, sleep apnea, coughing up blood, abdominal pain, blood in stool, nausea, vomiting, diarrhea, heartburn, incontinence, blood in urine, muscle weakness, joint pain, leg swelling, rash, skin lesions, headache, fainting, dizziness, depression, anxiety, enlarged lymph nodes, easy bruising or bleeding, and environmental allergies.      Physical Examination Filed Vitals:   05/18/10 1510  BP: 152/80  Pulse: 69   Chronically ill-appearing overweight woman in no acute distress. HEENT: Conjunctiva and lids normal, oropharynx with poor dentition.  Neck: Supple, bilateral carotid endarterectomy scars, very soft left bruit, no thyromegaly. Lungs: Decreased breath sounds, no rales or wheezing. Nonlabored. Cardiac: Regular rate and rhythm, no S3 gallop. Abdomen: Soft, nontender, bowel sounds present. Skin: Warm and dry, stasis noted distally.  Extremities: No pitting edema, distal pulses one plus. Musculoskeletal: No kyphosis. Neuropsychiatric: Alert and oriented x3, affect grossly appropriate.  ECG  NSR at 70 bpm; NL axis; downsloping IFLAT depression in lateral leads   Studies Cardiac catheterization 10/27/2009: PROCEDURAL FINDINGS:  Right  atrial pressure mean of 6.  Right   ventricular pressure is 40/9.  Pulmonary artery pressure is 44/12 with a   mean of 29.  Pulmonary capillary wedge pressure is a mean of 14.      Oxygen saturations:  Pulmonary artery 62%, central aorta is 91%.   Cardiac output by the Fick method is 4.4 liters per minute.  Cardiac   index is 3 liters per minute per sq. m.      Left ventriculography shows mild global hypokinesis with an LVEF   estimated at 50%.  There are no regional wall motion abnormalities   visualized.      Coronary angiography:  The LAD has a separate ostium from the   circumflex.  The LAD reaches the LV apex.  The vessel was diffusely   diseased.  There is 60% proximal stenosis, 70% diffuse mid stenosis.   There is a second diagonal branch that supplies a large territory of   myocardium.  It is also diffusely diseased and has 80% stenosis.      Left circumflex:  The left circumflex is small and diffusely diseased.   The proximal circumflex has 70% tandem stenoses.  The distal circumflex   has a 75% stenosis.  The entire vessel is of small caliber.  There is   significant pressure dampening with a catheter into the circumflex.      Right coronary artery:  The right coronary artery is also small and   diffusely diseased.  There is 60% proximal stenosis, 50% mid stenosis.   The distal vessel is patent gives off a PDA and posterolateral branch.   The right coronary artery also had significant pressure dampening with a   catheter into its ostium.  Problem List and Plan

## 2010-05-18 NOTE — Assessment & Plan Note (Signed)
Continue high dose Lipitor. LDL 64, Dec 2011.

## 2010-05-24 NOTE — Assessment & Plan Note (Signed)
OFFICE VISIT   [name] [name], [name] [name]  DOB:  Aug 10, 1945                                       11/02/2009  XVQMG#:[id number]   The patient returns today for initial follow-up regarding her left  carotid endarterectomy which was performed on October 5.  She  had  previously had right carotid endarterectomy performed on August 31 and  both of these lesions were asymptomatic.  She did very well during her  hospital stay and was discharged on the first  postoperative day but a  week later began having some chest discomfort and was readmitted with a  non-Q MI and had cardiac catheterization on October 18 which revealed  diffuse small-vessel disease not amenable to angioplasty and stenting or  bypass.  She has ejection fraction of 50%.  She has been asymptomatic  since her discharge from hospital and will be following up with Dr.  Domenic Polite in the near future.  She denies any hoarseness.  No aphasia,  amaurosis fugax or dysphagia or other neurologic symptoms since her  carotid surgery.   PHYSICAL EXAMINATION:  Today, blood pressure 118/64, heart rate 79,  respirations 14.  Carotid pulses are 3+ with no audible bruits.  Both  incisions are healing nicely.  Neurologic exam is normal.   In general I think she is doing well from a vascular standpoint  following carotid surgery having suffered a non-Q MI 1 week  postoperative.  We will see her in 6 months with follow-up carotid  duplex exam at that time unless she develops any specific symptoms in  the interim.     Nelda Severe Kellie Simmering, M.D.  Electronically Signed   JDL/MEDQ  D:  11/02/2009  T:  11/03/2009  Job:  [id number]

## 2010-05-24 NOTE — Assessment & Plan Note (Signed)
OFFICE VISIT   SULAY, BRYMER  DOB:  1945-06-27                                       10/05/2009  UMPNT#:[id number]   Patient returns for initial follow-up following her right carotid  endarterectomy performed August 31 for a severe right internal carotid  stenosis.  She recently had suffered a left basal ganglia stroke about 3-  4 weeks preoperatively.  She also has an 80% left internal carotid  stenosis.  Her right carotid surgery required resection of the common  carotid with primary reanastomosis to correct redundancy.  She did very  well following her surgery with no neurologic complications or  complaints.  She is swallowing well, has no hoarseness, and her diabetes  has continued be under good control.   PHYSICAL EXAMINATION:  Blood pressure 188/77, heart rate 75, temperature  97.9.  Right neck incision is well-healed.  NEUROLOGIC:  Normal.  Speech is clear.  Carotid pulses are 3+ with a soft bruit on the left.   I think she is doing quite well following her surgery, and she would  like to proceed next week with her left carotid surgery.  We have  scheduled that for Wednesday October 5 at [facility].  The risks and  benefits have been thoroughly discussed, and she will proceed at that  time.     Nelda Severe Kellie Simmering, M.D.  Electronically Signed   JDL/MEDQ  D:  10/05/2009  T:  10/05/2009  Job:  [id number]

## 2010-05-24 NOTE — Procedures (Signed)
CAROTID DUPLEX EXAM   INDICATION:  Cardiovascular accident.   HISTORY:  Diabetes:  yes  Cardiac:  no  Hypertension:  yes  Smoking:  yes  Previous Surgery:  no  CV History:  no  Amaurosis Fugax No, Paresthesias No, Hemiparesis No                                       RIGHT             LEFT  Brachial systolic pressure:         192               195  Brachial Doppler waveforms:         Within normal limits                Within normal limits  Vertebral direction of flow:        Antegrade         Antegrade  DUPLEX VELOCITIES (cm/sec)  CCA peak systolic                   119               86  ECA peak systolic                   326               657  ICA peak systolic                   465               846  ICA end diastolic                   121               87  PLAQUE MORPHOLOGY:                  homogeneous       homogeneous  PLAQUE AMOUNT:                      large             large  PLAQUE LOCATION:                    ICA, ECA, CCA     ICA, ECA, CCA   IMPRESSION:  1. Right internal carotid artery shows evidence of 80% to 99% stenosis      with a narrow channel extending from mid internal carotid artery to      approximately 3.5 cm into the internal carotid artery.  2. Right distal common carotid artery homogenous plaque noted.  3. Left internal carotid artery shows evidence of a 60% to 79%      stenosis.  4. Bilateral external carotid artery stenosis.   ___________________________________________  Nelda Severe Kellie Simmering, M.D.   EM/MEDQ  D:  08/31/2009  T:  08/31/2009  Job:  [id number]

## 2010-05-24 NOTE — H&P (Signed)
HISTORY AND PHYSICAL EXAMINATION   August 31, 2009   Re:  [name] [name], [name] [name]                 DOB:  1945-11-01   CHIEF COMPLAINTS:  Recent basal ganglia stroke with severe bilateral  carotid occlusive disease.   HISTORY OF PRESENT ILLNESS:  This 65 year old female was referred by Dr.  Woody Seller for evaluation of her carotid occlusive disease.  This patient  states that about early August she developed some slurred speech and  some generalized symptoms which she finds difficult to describe.  She  later went to the emergency department and was felt to have suffered a  stroke.  She had an MRI performed on August 8 which revealed a basal  ganglia stroke.  Her symptoms have slowly improved since that time.  She  states she still has some slow speech but no generalized weakness.  She  had no previous history of TIA, amaurosis fugax or stroke.  She had a  carotid duplex study performed by [facility] on August 15 which I have reviewed the report.  The velocities  suggests that the patient has mild to moderate occlusive disease  bilaterally.   CHRONIC MEDICAL PROBLEMS:  1. Left basal ganglia stroke.  2. 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  3. Hypertension.  4. Diabetes mellitus type 1.  5. Negative for coronary artery disease, COPD or hyperlipidemia.   SOCIAL HISTORY:  The patient is widowed, has four children and is  disabled.  She has smoked one to two packs of cigarettes for 50+ years.  Does not use alcohol.   REVIEW OF SYSTEMS:  Positive for stroke in her mother, coronary artery  disease in her father.  Negative for diabetes.   REVIEW OF SYSTEMS:  Positive for occasional chest pain.  No history of  coronary artery disease.  Does have arthritis joint pain, depression.  All other systems in review of systems are negative.   PHYSICAL EXAM:  Vital signs:  Blood pressure 140/92, heart rate 88,  respirations 14.  General:  A well-developed,  well-nourished female in  no apparent distress.  Alert and oriented times 3.  HEENT:  Normal for  age.  EOMs intact.  Lungs:  Clear to auscultation.  No rhonchi or  wheezing.  Cardiovascular:  Regular rhythm.  No murmurs.  Carotid pulses  3+ with bilateral bruits.  Abdomen:  Soft, nontender.  No masses  palpable.  Musculoskeletal:  Exam is free of major deformities.  Neurologic:  Exam reveals her speech to be slow but understandable.  She  has no other focal deficits noted.  Skin:  Free of rashes.  Lower  extremity:  Exam reveals 3+ femoral, popliteal and dorsalis pedis pulses  palpable.   Today I ordered a carotid duplex exam to be done in our office for  comparative reading.  We have a slight different result with a 90%-95%  right internal carotid stenosis with a redundant right internal carotid  artery and an 80% left internal carotid stenosis.   I feel that this patient does need staged bilateral carotid  endarterectomies.  I am not certain which one was responsible for the  stroke which was in the basal ganglia area.  Her right side is much more  severe.  I believe we should begin on that side.   PLAN:  Elective right carotid endarterectomy on Wednesday August 31.  Risks and benefits have been thoroughly discussed with the patient.  She  would like to proceed.  Will then follow in the postoperative period at  some point with the left carotid endarterectomy.     Nelda Severe Kellie Simmering, M.D.  Electronically Signed   JDL/MEDQ  D:  08/31/2009  T:  09/01/2009  Job:  [id number]   cc:   Jerene Bears, MD
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
## 2010-05-27 NOTE — Discharge Summary (Signed)
NAME:  [name] [name], [name] [name]                           ACCOUNT NO.:  [id number]   MEDICAL RECORD NO.:  [id number]                   PATIENT TYPE:  IPS   LOCATION:  0306                                 [facility]ACILITY:  [facility]   PHYSICIAN:  Rulon Eisenmenger, M.D.              DATE OF BIRTH:  1945/03/04   DATE OF ADMISSION:  08/07/2002  DATE OF DISCHARGE:  08/11/2002                                 DISCHARGE SUMMARY   IDENTIFYING DATA:  This is a 65 year old widowed white Caucasian female  voluntarily admitted, presenting with depression, having increased since the  death of her husband.  Having suicidal thoughts.  Attempted an overdose  years ago.  Had multiple plans.   MEDICATIONS:  Allegra, Lexapro, Protonix, Diazide, Toprol, Lipitor, Amaryl,  metformin, lisinopril, Lopid, Cipro for urinary tract infection, Novolin  insulin 30 units q.a.m. and 40 units q.h.s.   ALLERGIES:  SULFA medications.   PHYSICAL EXAMINATION:  Performed at [facility].  Essentially within normal  limits.  Neurologically nonfocal.   LABORATORY DATA:  Glucose 169.  Alcohol less than 5.  CBC and CMET within  normal limits.  Urine drug screen negative.   MENTAL STATUS EXAM:  Alert, oriented, cooperative, well-groomed.  Good eye  contact.  Speech clear.  Mood depressed, tearful.  Thought processes goal  directed.  Thought content negative for dangerous ideation or psychotic  symptoms.  Cognitively intact.  Judgment and insight fair.   ADMISSION DIAGNOSES:   AXIS I:  Major depressive disorder, single episode.   AXIS II:  Deferred.   AXIS III:  1. Hypertension.  2. Angina.  3. Type 2 diabetes mellitus.   AXIS IV:  Moderate (problems with primary support group, economic problems  and other medical problems).   AXIS V:  30/65.   HOSPITAL COURSE:  The patient was admitted and ordered routine p.r.n.  medications and underwent further monitoring.  Encouraged to participate in  individual, group and milieu therapy.  The  patient was resumed on all  medical medications and monitored closely medically.  Insulin was adjusted  as per internal medicine and patient was started on low dose Risperdal and  optimized on Lexapro targeting depressive symptoms.  The patient felt she  was responding and improving with medications with no side effects.   CONDITION ON DISCHARGE:  Improved.  She was less depressed.  No overt  psychotic symptoms.  No dangerous ideation.   DISCHARGE MEDICATIONS:  1. Nitroglycerin as previously prescribed.  2. Novolog insulin as previously prescribed.  3. Lisinopril.  4. Lopid.  5. Cipro.  6. Maxzide.  7. Lipitor.  8. Claritin.  9. Protonix.  10.      Toprol.  11.      Amaryl.  12.      Glucophage.   All as previously prescribed.   1. Risperdal 1 mg q.h.s.  2. Lexapro 20 mg q.a.m.  3. Ambien 10  mg q.h.s.   FOLLOW UP:  The patient was discharged to follow up at [facility] on August 18, 2002 at 8 a.m.   DISCHARGE DIAGNOSES:   AXIS I:  Major depressive disorder, single episode.   AXIS II:  Deferred.   AXIS III:  1. Hypertension.  2. Angina.  3. Type 2 diabetes mellitus.   AXIS IV:  Moderate (problems with primary support group, economic problems  and other medical problems).   AXIS V:  Global Assessment of Functioning on discharge 64.                                               Rulon Eisenmenger, M.D.    JEM/MEDQ  D:  09/04/2002  T:  09/04/2002  Job:  [id number]

## 2010-05-27 NOTE — H&P (Signed)
NAME:  [name] [name], [name] [name]                           ACCOUNT NO.:  [id number]   MEDICAL RECORD NO.:  [id number]                   PATIENT TYPE:  IPS   LOCATION:  0306                                 [facility]ACILITY:  [facility]   PHYSICIAN:  Rulon Eisenmenger, M.D.              DATE OF BIRTH:  12-13-1945   DATE OF ADMISSION:  08/07/2002  DATE OF DISCHARGE:  08/11/2002                         PSYCHIATRIC ADMISSION ASSESSMENT   IDENTIFYING INFORMATION:  A 65 year old widowed white female voluntarily  admitted on August 07, 2002.   HISTORY OF PRESENT ILLNESS:  The patient presents with depression,  increasing since the death of her husband, having suicidal thoughts,  previously attempted an overdose years ago.  The patient states her plan was  [place] a [place] because she could not swim, or carbon monoxide  poisoning.  She feels very hopeless and helpless.  She has been sleeping  only about 3-4 hours a night, or sometimes sleeping excessively.  Her  appetite has been satisfactory.  She did have a 30 pound weight loss.  The  patient also reports a history of mood swings.  She states she feels her  children are running her life, telling her what to do and not to do.   PAST PSYCHIATRIC HISTORY:  First admission to [facility].   SOCIAL HISTORY:  She is a widowed white female, widowed for 1 year 2 months.  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  Has a 12th grade education.  Has 4 children, ages 61, 22, 39, 44.  No legal  problems.  Her daughter has been living with since her husband died.   FAMILY HISTORY:  No other psychiatric problems noted.   ALCOHOL DRUG HISTORY:  No alcohol for 10 years.  Smokes 1-2 packs per day.  No current drug use.   PAST MEDICAL HISTORY:  Primary care Shizuye Rupert is Dr. Woody Seller.  Medical problems  are hypertension, diabetes, and angina.   MEDICATIONS:  Allegra 180 mg daily, Lexapro 10 mg daily, Protonix 40 mg  daily, Diazide 25  mg daily, Toprol XL 100 mg daily, Lipitor 40 mg daily,  Amaryl 4 mg daily, Metformin 500 mg t.i.d. with meals, Lisinopril 20 mg  daily, Lopid 600 mg p.o. b.i.d., Cipro 500 mg p.o. b.i.d. for 7 days for a  urinary tract infection, first dose received on August 06, 2002.  Novolog  insulin 70/30 38 units q.a.m. and Novolog insulin 70/30 48 units q.p.m.   DRUG ALLERGIES:  SULFA.   PHYSICAL EXAMINATION:  Done at [facility].  The patient appears in no acute  distress today.  Her blood sugar this morning was 129.   LABORATORY DATA:  Alcohol level was less than 5.  Glucose was 169 on August 07, 2002.  Her CBC was within normal limits.  Urine drug screen was  negative.   MENTAL STATUS EXAM:  She is alert, oriented, cooperative, well groomed, good  eye contact.  Speech is clear, mood is depressed, she is tearful, appears  sad.  Thought processes are coherent, no evidence of psychosis, expresses  suicidal ideation at times, promises safety.  Cognitive function intact,  fair memory.  Judgment appears to be fair to good, insight is good.    ADMISSION DIAGNOSES:   AXIS I:  Major depressive disorder, single episode.   AXIS II:  Deferred.   AXIS III:  Hypertension, angina, type 2 diabetes, hypercholesterolemia.   AXIS IV:  Problems with primary support group, economic, other psychosocial  problems, medical problems.   AXIS V:  Current is 30, this past year 51.   PLAN:  Voluntary admission for depressive symptoms and suicidal ideation.  Will check every 15 minutes.  Will stabilize mood and thinking so the  patient can be safe.  Will check her blood sugars a.c. and h.s.  Will  increase her Lexapro to decrease depressive symptoms.  Will verify her  cardiac medications.  Will have nitroglycerine available for complaints of  angina.  Will encourage group activity.  Will have family session with  children if patient is agreeable.  Will encourage fluids and continue with  the patient's antibiotics.   Will have a nicotine patch available if the  patient requests.  The patient to follow up with mental health, continue to  be medication compliant.     Redgie Grayer, N.P.                       Rulon Eisenmenger, M.D.    JO/MEDQ  D:  08/11/2002  T:  08/12/2002  Job:  [id number]
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
## 2010-05-27 NOTE — Discharge Summary (Signed)
NAME:  [name] [name], [name] [name]                           ACCOUNT NO.:  [id number]   MEDICAL RECORD NO.:  [id number]                   PATIENT TYPE:  INP   LOCATION:  2930                                 FACILITY:  [facility]   PHYSICIAN:  Loretha Brasil. Lia Foyer, M.D.             DATE OF BIRTH:  Jul 02, 1945   DATE OF ADMISSION:  07/09/2002  DATE OF DISCHARGE:  07/10/2002                           DISCHARGE SUMMARY - REFERRING   PROCEDURES:  1. Cardiac catheterization.  2. Coronary arteriogram.  3. Left ventriculogram.   HOSPITAL COURSE:  [name] [name] is a 65 year old female with no known history of  coronary artery disease.  She went to [facility] on July 09, 2002,  for chest pain and was seen there by Dr. Dannielle Burn in consultation.  He  evaluated her and felt that proceeding with transfer to [facility]  for further management and diagnostic evaluation by cardiac catheterization  was the best option.  He felt that she had a high risk profile and high  prejudged probability for coronary artery disease.   [name] [name] enzymes were negative for MI, and she had a cardiac  catheterization performed on July 10, 2002.  The cardiac catheterization  showed a left main that was very short and essentially eccentric adjunct  ostia for the LAD and circumflex.  The LAD had a 40-50% proximal stenosis  and a 30% distal stenosis.  The circumflex had two 30% lesions, and the RCA  had a 60% proximal stenosis and a 20-30% mid and distal stenosis with a 50%  stenosis in the RCA branch.  Her EF was 78% with no wall motion  abnormalities.   Dr. Domenic Polite evaluated [name] [name] and recommended aggressive medical therapy  and risk factor modification.  He also recommended consideration be given to  an outpatient Cardiolite on medications to further evaluate the ostial 60%  RCA lesion.  Additionally, it was recommended that she have a D-dimer  checked to rule out PE.   [name] [name] had a D-dimer drawn, and the D-dimer was  negative at 0.35.  Her  chest pain had been further evaluated by Dr. Caryl Comes, who thought that she was  exquisitely tender over her sternal and costal area.  He started her on high  dose aspirin at 650 mg q.6 h., and by July 10, 2002, p.m., her pain was much  better controlled.  It was then felt that she could go to aspirin 325 mg  daily and could also take Tylenol 650 mg up to q.i.d. p.r.n.   [name] [name] is pending completion of bedrest and ambulation.  If she ambulates  without chest pain or shortness of breath and her groin is stable, she is  considered stable for discharge on July 10, 2002.   LABORATORY DATA:  Hemoglobin 12.9, hematocrit 36.5, WBCs 8.0, platelets 194.  D-dimer 0.35.  Serial CK-MB and Troponin are negative for MI.  Total  cholesterol 196, triglycerides 627, HDL 24, LDL not calculated.   DISCHARGE CONDITION:  Stable.   DISCHARGE DIAGNOSES:  1. Chest pain, no critical coronary artery disease by catheterization with     negative D-dimer and musculoskeletal symptoms.  Follow up with cardiology     and primary medical doctor.  2. Insulin-dependent diabetes mellitus.  3. 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  4. Preserved left ventricular function by catheterization.  5. Shoulder pain, status post negative three view shoulder x-ray.  6. Hypertension.  7. Status post cholecystectomy, appendectomy, and hysterectomy as well as     left wrist bone graft.  8. History of tobacco use.  9. Family history of coronary artery disease.  10.      History of allergy to SULFA DRUGS.   ACTIVITY:  Her activity level is to include no driving, sexual or strenuous  activity x2 days.   DIET:  She is to stick to a low-fat, diabetic diet.   SPECIAL INSTRUCTIONS:  She is to call the office for problems with the  catheterization site.   FOLLOWUP:  1. She is to follow up with Dr. Dannielle Burn on July 31, 2002, at 10:15 a.m.  2. She is to follow up with Dr. Jerene Bears and call for an appointment.   DISCHARGE MEDICATIONS:  1. Metformin 500 mg t.i.d., restart July 13, 2002.  2. Triamterene/HCTZ 37.5/25 mg p.o. daily.  3. Amaryl 4 mg b.i.d.  4. Lisinopril 20 mg daily.  5. Toprol-XL 100 mg daily.  6. Novolog 70/30, as prior to admission.  7. Lipitor 40 mg daily.  8. Coated aspirin 325 mg daily.     Levonne Lapping, P.A.-C  [facility]           Loretha Brasil. Lia Foyer, M.D.    RRG/MEDQ  D:  07/10/2002  T:  07/10/2002  Job:  [id number]   cc:   Glenda Chroman, M.D.    cc:   Glenda Chroman, M.D.

## 2010-05-27 NOTE — Cardiovascular Report (Signed)
NAME:  [name] [name], [name] [name]                           ACCOUNT NO.:  [id number]   MEDICAL RECORD NO.:  [id number]                   PATIENT TYPE:  INP   LOCATION:  2930                                 FACILITY:  [facility]   PHYSICIAN:  Satira Sark, M.D. [facility]        DATE OF BIRTH:  12-10-1945   DATE OF PROCEDURE:  DATE OF DISCHARGE:                              CARDIAC CATHETERIZATION   INDICATIONS:  The patient is a 65 year old woman with hypertension,  dyslipidemia, type 2 diabetes mellitus, and tobacco use who has had recent  recurrent chest discomfort.  She has ruled out for myocardial infarction but  given her risk factors for coronary atherosclerosis is referred for  diagnostic coronary angiography.   PROCEDURES PERFORMED:  1. Left heart catheterization.  2. Selective coronary angiography.  3. Left ventriculography.  4. Distal aortography.   ACCESS AND EQUIPMENT:  The area about the right femoral artery was  anesthetized with 1% lidocaine and a 6-French sheath was placed in the right  femoral artery via the modified Seldinger technique.  A 6-French JL3.5  catheter was used for selective angiography of the left anterior descending  and circumflex coronary arteries.  The left main was very short with  essentially separate ostia for these vessels that were adjacent.  A 5-French  and 6-French JR4 catheter were used for selective injection of the right  coronary artery due to damping.  A 6-French angled pigtail catheter was used  for left heart catheterization, left ventriculography, as well as distal  aortography.  All exchanges were made over a wire and the patient tolerated  the procedure well without immediate complications.   HEMODYNAMICS:  1. Left ventricle 137/17 mmHg.  2. Aorta 136/62 mmHg.   ANGIOGRAPHIC FINDINGS:  1. The left main coronary artery was very short.  Essentially, there were     separate ostia for left anterior descending and circumflex adjacent to     each  other.  There was a 40-50% proximal left anterior descending     stenosis.  There were two diagonal branches and 30% stenosis in the     distal left anterior descending.  2. The left anterior descending had 30% mid and distal stenoses with no     major flow limiting lesions.  3. The right coronary artery was a dominant vessel.  There was a 60% ostial     stenosis that did not improve following two separate intracoronary     injections of 150 mcg of nitroglycerin.  A 5-French catheter was used and     there still was damping present.  In the mid and distal segment of the     vessel there was 20 and 30% stenosis as well as some 50% stenoses in the     small branches in the posterior descending and posterolateral     distribution.   LEFT VENTRICULOGRAPHY:  Left ventriculography was performed in the RAO  projection  revealing an ejection fraction calculated at 78% post PVC with  trace mitral regurgitation and no focal wall motion abnormalities.   DISTAL AORTOGRAPHY:  Distal aortography showed mild plaquing in the aortic  and iliac systems without focal flow limiting stenoses.   DIAGNOSES:  1. Diffuse non-obstructive coronary atherosclerosis as outlined, the most     significant lesion being a 60% ostial right coronary artery stenosis.  2. Left ventricular ejection fraction calculated at 78% (post PVC)     associated with trace mitral regurgitation in the setting of ectopy.  3. Minor atherosclerosis of the distal aorta and iliac system.  4. 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   RECOMMENDATIONS:  Would continue aggressive medical therapy and risk factor  modification at this time.  Would also consider an outpatient Cardiolite  once medical therapy is titrated.                                               Satira Sark, M.D. [facility]    SGM/MEDQ  D:  07/10/2002  T:  07/10/2002  Job:  [id number]

## 2010-05-29 ENCOUNTER — Other Ambulatory Visit: Payer: Self-pay | Admitting: Cardiovascular Disease

## 2010-05-31 NOTE — Telephone Encounter (Signed)
Please contact primary care doctor.

## 2010-06-07 ENCOUNTER — Other Ambulatory Visit: Payer: Self-pay | Admitting: *Deleted

## 2010-06-07 DIAGNOSIS — I2510 Atherosclerotic heart disease of native coronary artery without angina pectoris: Secondary | ICD-10-CM

## 2010-06-07 MED ORDER — CLOPIDOGREL BISULFATE 75 MG PO TABS: 75.0000 mg | ORAL_TABLET | Freq: Every day | ORAL | Status: DC

## 2010-07-19 ENCOUNTER — Other Ambulatory Visit: Payer: Self-pay | Admitting: Cardiovascular Disease

## 2010-07-20 NOTE — Telephone Encounter (Signed)
Refill request

## 2010-07-21 ENCOUNTER — Other Ambulatory Visit: Payer: Self-pay | Admitting: Cardiovascular Disease

## 2010-09-28 ENCOUNTER — Other Ambulatory Visit: Payer: Self-pay | Admitting: Cardiology

## 2010-09-28 MED ORDER — LISINOPRIL 40 MG PO TABS: 40.0000 mg | ORAL_TABLET | Freq: Every day | ORAL | Status: DC

## 2010-10-13 ENCOUNTER — Inpatient Hospital Stay (HOSPITAL_COMMUNITY)
Admission: AD | Admit: 2010-10-13 | Discharge: 2010-10-19 | DRG: 280 | Disposition: A | Discharge status: Home / Self Care | Payer: Medicare Other | Source: Other Acute Inpatient Hospital | Attending: Cardiology | Admitting: Cardiology

## 2010-10-13 ENCOUNTER — Inpatient Hospital Stay (HOSPITAL_COMMUNITY): Payer: Medicare Other

## 2010-10-13 DIAGNOSIS — Z8673 Personal history of transient ischemic attack (TIA), and cerebral infarction without residual deficits: Secondary | ICD-10-CM

## 2010-10-13 DIAGNOSIS — I472 Ventricular tachycardia, unspecified: Secondary | ICD-10-CM | POA: Diagnosis present

## 2010-10-13 DIAGNOSIS — E1169 Type 2 diabetes mellitus with other specified complication: Secondary | ICD-10-CM | POA: Diagnosis present

## 2010-10-13 DIAGNOSIS — F329 Major depressive disorder, single episode, unspecified: Secondary | ICD-10-CM | POA: Diagnosis present

## 2010-10-13 DIAGNOSIS — I2589 Other forms of chronic ischemic heart disease: Secondary | ICD-10-CM | POA: Diagnosis present

## 2010-10-13 DIAGNOSIS — I10 Essential (primary) hypertension: Secondary | ICD-10-CM | POA: Diagnosis present

## 2010-10-13 DIAGNOSIS — R079 Chest pain, unspecified: Secondary | ICD-10-CM

## 2010-10-13 DIAGNOSIS — Z882 Allergy status to sulfonamides status: Secondary | ICD-10-CM

## 2010-10-13 DIAGNOSIS — M2669 Other specified disorders of temporomandibular joint: Secondary | ICD-10-CM | POA: Diagnosis present

## 2010-10-13 DIAGNOSIS — M9080 Osteopathy in diseases classified elsewhere, unspecified site: Secondary | ICD-10-CM | POA: Diagnosis present

## 2010-10-13 DIAGNOSIS — Z87891 Personal history of nicotine dependence: Secondary | ICD-10-CM

## 2010-10-13 DIAGNOSIS — I509 Heart failure, unspecified: Secondary | ICD-10-CM | POA: Diagnosis present

## 2010-10-13 DIAGNOSIS — F3289 Other specified depressive episodes: Secondary | ICD-10-CM | POA: Diagnosis present

## 2010-10-13 DIAGNOSIS — I739 Peripheral vascular disease, unspecified: Secondary | ICD-10-CM | POA: Diagnosis present

## 2010-10-13 DIAGNOSIS — I5043 Acute on chronic combined systolic (congestive) and diastolic (congestive) heart failure: Secondary | ICD-10-CM | POA: Diagnosis present

## 2010-10-13 DIAGNOSIS — Z7902 Long term (current) use of antithrombotics/antiplatelets: Secondary | ICD-10-CM

## 2010-10-13 DIAGNOSIS — M869 Osteomyelitis, unspecified: Secondary | ICD-10-CM | POA: Diagnosis present

## 2010-10-13 DIAGNOSIS — I214 Non-ST elevation (NSTEMI) myocardial infarction: Principal | ICD-10-CM | POA: Diagnosis present

## 2010-10-13 DIAGNOSIS — I2510 Atherosclerotic heart disease of native coronary artery without angina pectoris: Secondary | ICD-10-CM

## 2010-10-13 DIAGNOSIS — Z79899 Other long term (current) drug therapy: Secondary | ICD-10-CM

## 2010-10-13 DIAGNOSIS — I4729 Other ventricular tachycardia: Secondary | ICD-10-CM | POA: Diagnosis present

## 2010-10-13 DIAGNOSIS — E785 Hyperlipidemia, unspecified: Secondary | ICD-10-CM | POA: Diagnosis present

## 2010-10-13 DIAGNOSIS — Z7982 Long term (current) use of aspirin: Secondary | ICD-10-CM

## 2010-10-13 DIAGNOSIS — L02619 Cutaneous abscess of unspecified foot: Secondary | ICD-10-CM | POA: Diagnosis present

## 2010-10-13 DIAGNOSIS — Z794 Long term (current) use of insulin: Secondary | ICD-10-CM

## 2010-10-13 DIAGNOSIS — E876 Hypokalemia: Secondary | ICD-10-CM | POA: Diagnosis present

## 2010-10-13 LAB — CBC
MCHC: 33.6 g/dL (ref 30.0–36.0)
MCV: 81.3 fL (ref 78.0–100.0)
Platelets: 200 10*3/uL (ref 150–400)
RDW: 15 % (ref 11.5–15.5)
WBC: 8.1 10*3/uL (ref 4.0–10.5)

## 2010-10-13 LAB — APTT: aPTT: 40 seconds — ABNORMAL HIGH (ref 24–37)

## 2010-10-13 LAB — GLUCOSE, CAPILLARY
Glucose-Capillary: 184 mg/dL — ABNORMAL HIGH (ref 70–99)
Glucose-Capillary: 134 mg/dL — ABNORMAL HIGH (ref 70–99)
Glucose-Capillary: 124 mg/dL — ABNORMAL HIGH (ref 70–99)
Glucose-Capillary: 182 mg/dL — ABNORMAL HIGH (ref 70–99)

## 2010-10-13 LAB — CARDIAC PANEL(CRET KIN+CKTOT+MB+TROPI)
CK, MB: 55.5 ng/mL (ref 0.3–4.0)
CK, MB: 73.1 ng/mL (ref 0.3–4.0)
Relative Index: 11.7 — ABNORMAL HIGH (ref 0.0–2.5)
Relative Index: 11.3 — ABNORMAL HIGH (ref 0.0–2.5)
Total CK: 733 U/L — ABNORMAL HIGH (ref 7–177)
Troponin I: 8.01 ng/mL (ref <0.30)
Troponin I: 15.19 ng/mL (ref <0.30)

## 2010-10-13 LAB — COMPREHENSIVE METABOLIC PANEL
ALT: 30 U/L (ref 0–35)
AST: 55 U/L — ABNORMAL HIGH (ref 0–37)
Albumin: 3.6 g/dL (ref 3.5–5.2)
Alkaline Phosphatase: 95 U/L (ref 39–117)
BUN: 29 mg/dL — ABNORMAL HIGH (ref 6–23)
Chloride: 103 mEq/L (ref 96–112)
Potassium: 3.2 mEq/L — ABNORMAL LOW (ref 3.5–5.1)
Sodium: 140 mEq/L (ref 135–145)
Total Bilirubin: 0.4 mg/dL (ref 0.3–1.2)
Total Protein: 7.1 g/dL (ref 6.0–8.3)

## 2010-10-13 LAB — LIPID PANEL
Cholesterol: 114 mg/dL (ref 0–200)
HDL: 31 mg/dL — ABNORMAL LOW (ref >39)
LDL Cholesterol: 64 mg/dL (ref 0–99)
Total CHOL/HDL Ratio: 3.7 RATIO
Triglycerides: 96 mg/dL (ref <150)
VLDL: 19 mg/dL (ref 0–40)

## 2010-10-13 LAB — HEMOGLOBIN A1C
Hgb A1c MFr Bld: 10 % — ABNORMAL HIGH (ref <5.7)
Mean Plasma Glucose: 240 mg/dL — ABNORMAL HIGH (ref <117)

## 2010-10-14 LAB — HEPARIN LEVEL (UNFRACTIONATED): Heparin Unfractionated: <0.1 IU/mL — ABNORMAL LOW (ref 0.30–0.70)

## 2010-10-14 LAB — BASIC METABOLIC PANEL
Calcium: 8.3 mg/dL — ABNORMAL LOW (ref 8.4–10.5)
GFR calc Af Amer: 82 mL/min — ABNORMAL LOW (ref >90)
GFR calc non Af Amer: 70 mL/min — ABNORMAL LOW (ref >90)
Glucose, Bld: 122 mg/dL — ABNORMAL HIGH (ref 70–99)
Potassium: 3.4 mEq/L — ABNORMAL LOW (ref 3.5–5.1)
Sodium: 140 mEq/L (ref 135–145)

## 2010-10-14 LAB — CBC
Hemoglobin: 10 g/dL — ABNORMAL LOW (ref 12.0–15.0)
MCH: 26.9 pg (ref 26.0–34.0)
MCHC: 32.3 g/dL (ref 30.0–36.0)
RDW: 15.1 % (ref 11.5–15.5)

## 2010-10-14 LAB — GLUCOSE, CAPILLARY
Glucose-Capillary: 121 mg/dL — ABNORMAL HIGH (ref 70–99)
Glucose-Capillary: 163 mg/dL — ABNORMAL HIGH (ref 70–99)
Glucose-Capillary: 191 mg/dL — ABNORMAL HIGH (ref 70–99)
Glucose-Capillary: 214 mg/dL — ABNORMAL HIGH (ref 70–99)
Glucose-Capillary: 293 mg/dL — ABNORMAL HIGH (ref 70–99)

## 2010-10-15 DIAGNOSIS — I5023 Acute on chronic systolic (congestive) heart failure: Secondary | ICD-10-CM

## 2010-10-15 LAB — CBC
HCT: 32.8 % — ABNORMAL LOW (ref 36.0–46.0)
Hemoglobin: 10.5 g/dL — ABNORMAL LOW (ref 12.0–15.0)
MCH: 26.6 pg (ref 26.0–34.0)
MCHC: 32 g/dL (ref 30.0–36.0)
RBC: 3.94 MIL/uL (ref 3.87–5.11)

## 2010-10-15 LAB — GLUCOSE, CAPILLARY
Glucose-Capillary: 93 mg/dL (ref 70–99)
Glucose-Capillary: 203 mg/dL — ABNORMAL HIGH (ref 70–99)

## 2010-10-15 LAB — BASIC METABOLIC PANEL
Calcium: 8.9 mg/dL (ref 8.4–10.5)
GFR calc non Af Amer: 68 mL/min — ABNORMAL LOW (ref >90)
Sodium: 136 mEq/L (ref 135–145)

## 2010-10-15 LAB — PRO B NATRIURETIC PEPTIDE: Pro B Natriuretic peptide (BNP): 1252 pg/mL — ABNORMAL HIGH (ref 0–125)

## 2010-10-16 LAB — GLUCOSE, CAPILLARY
Glucose-Capillary: 156 mg/dL — ABNORMAL HIGH (ref 70–99)
Glucose-Capillary: 205 mg/dL — ABNORMAL HIGH (ref 70–99)
Glucose-Capillary: 225 mg/dL — ABNORMAL HIGH (ref 70–99)
Glucose-Capillary: 235 mg/dL — ABNORMAL HIGH (ref 70–99)

## 2010-10-16 LAB — CBC
MCHC: 32.3 g/dL (ref 30.0–36.0)
MCV: 83.3 fL (ref 78.0–100.0)
Platelets: 172 10*3/uL (ref 150–400)
RDW: 15.1 % (ref 11.5–15.5)
WBC: 6.1 10*3/uL (ref 4.0–10.5)

## 2010-10-16 LAB — BASIC METABOLIC PANEL
BUN: 25 mg/dL — ABNORMAL HIGH (ref 6–23)
CO2: 33 mEq/L — ABNORMAL HIGH (ref 19–32)
Calcium: 8.8 mg/dL (ref 8.4–10.5)
Creatinine, Ser: 0.94 mg/dL (ref 0.50–1.10)

## 2010-10-17 DIAGNOSIS — I059 Rheumatic mitral valve disease, unspecified: Secondary | ICD-10-CM

## 2010-10-17 DIAGNOSIS — I214 Non-ST elevation (NSTEMI) myocardial infarction: Secondary | ICD-10-CM

## 2010-10-17 LAB — GLUCOSE, CAPILLARY: Glucose-Capillary: 216 mg/dL — ABNORMAL HIGH (ref 70–99)

## 2010-10-17 LAB — BASIC METABOLIC PANEL
BUN: 27 mg/dL — ABNORMAL HIGH (ref 6–23)
Calcium: 9.2 mg/dL (ref 8.4–10.5)
Creatinine, Ser: 0.88 mg/dL (ref 0.50–1.10)
GFR calc Af Amer: 79 mL/min — ABNORMAL LOW (ref >90)
GFR calc non Af Amer: 68 mL/min — ABNORMAL LOW (ref >90)
Glucose, Bld: 145 mg/dL — ABNORMAL HIGH (ref 70–99)

## 2010-10-17 LAB — CBC
HCT: 29.7 % — ABNORMAL LOW (ref 36.0–46.0)
Hemoglobin: 9.8 g/dL — ABNORMAL LOW (ref 12.0–15.0)
MCH: 26.9 pg (ref 26.0–34.0)
MCHC: 33 g/dL (ref 30.0–36.0)
MCV: 81.6 fL (ref 78.0–100.0)
RDW: 14.8 % (ref 11.5–15.5)

## 2010-10-18 LAB — CBC
MCH: 26.6 pg (ref 26.0–34.0)
MCHC: 32.4 g/dL (ref 30.0–36.0)
MCV: 82.1 fL (ref 78.0–100.0)
Platelets: 183 10*3/uL (ref 150–400)
RDW: 14.9 % (ref 11.5–15.5)

## 2010-10-18 LAB — GLUCOSE, CAPILLARY
Glucose-Capillary: 142 mg/dL — ABNORMAL HIGH (ref 70–99)
Glucose-Capillary: 302 mg/dL — ABNORMAL HIGH (ref 70–99)

## 2010-10-18 LAB — BASIC METABOLIC PANEL
CO2: 35 mEq/L — ABNORMAL HIGH (ref 19–32)
Calcium: 9.2 mg/dL (ref 8.4–10.5)
Creatinine, Ser: 0.93 mg/dL (ref 0.50–1.10)

## 2010-10-19 LAB — BASIC METABOLIC PANEL
BUN: 24 mg/dL — ABNORMAL HIGH (ref 6–23)
Chloride: 96 mEq/L (ref 96–112)
GFR calc non Af Amer: 62 mL/min — ABNORMAL LOW (ref >90)
Glucose, Bld: 179 mg/dL — ABNORMAL HIGH (ref 70–99)
Potassium: 4.1 mEq/L (ref 3.5–5.1)

## 2010-10-19 LAB — CBC
HCT: 31.1 % — ABNORMAL LOW (ref 36.0–46.0)
Hemoglobin: 10.3 g/dL — ABNORMAL LOW (ref 12.0–15.0)
MCHC: 33.1 g/dL (ref 30.0–36.0)

## 2010-10-19 LAB — GLUCOSE, CAPILLARY: Glucose-Capillary: 170 mg/dL — ABNORMAL HIGH (ref 70–99)

## 2010-10-27 ENCOUNTER — Other Ambulatory Visit: Payer: Self-pay

## 2010-10-27 DIAGNOSIS — L97509 Non-pressure chronic ulcer of other part of unspecified foot with unspecified severity: Secondary | ICD-10-CM

## 2010-10-27 DIAGNOSIS — I739 Peripheral vascular disease, unspecified: Secondary | ICD-10-CM

## 2010-10-27 NOTE — Cardiovascular Report (Signed)
NAMEMARIELLE, MANTIONE NO.:  [id number]  MEDICAL RECORD NO.:  [id number]  LOCATION:  2904                         FACILITY:  [facility]  PHYSICIAN:  Thayer Headings, M.D. DATE OF BIRTH:  1945-01-11  DATE OF PROCEDURE:  10/13/2010 DATE OF DISCHARGE:                           CARDIAC CATHETERIZATION   HISTORY:  [name] [name] is a 65 year old female with diabetes mellitus. She has known severe diffuse coronary artery disease.  She was admitted through the emergency room yesterday with severe chest pain and shortness breath.  She was seen a fellow and admitted to [place] last night.  She was noted have an elevated troponin level.  Her troponin level increased and she did have episodes of pain as well as nonsustained ventricular tachycardia.  She was brought to the cath lab for further evaluation.  PROCEDURE:  Left heart catheterization with coronary angiography.  OPERATOR:  Thayer Headings, MD  TECHNIQUE:  The right femoral artery was easily cannulated using modified Seldinger technique.  HEMODYNAMIC RESULTS:  LV pressure is 137/16.  Aortic pressure is 137/56.  ANGIOGRAPHY:  There is no true left main.  The left anterior descending artery and left circumflex artery had separate ostia.  The left anterior descending artery has a proximal 40% stenosis and then a mid 50% stenosis.  There was mild diffuse irregularities throughout the LAD.  The first diagonal artery is a very small vessel and it has a 99% stenosis.  The second diagonal artery has mild irregularities.  The left circumflex artery is extremely small and appears to be consistent with a diabetic vessel.  There is severe diffuse disease throughout the circumflex artery.  There is a mid 90% stenosis.  This lesion does not appear to be stented before because of the severe disease nature.  The distal branches have tight 90% stenosis as well.  The posterior descending artery is small and diffusely diseased.  It  has stenosis between 70-80% throughout its entire course.  There is a discrete 90% stenosis toward the distal aspect of the vessel.  The posterior descending artery has a 95% stenosis at its origin.  The posterior descending artery has mild-to-moderate irregularities with stenosis approximately of 50%.  The left ventriculogram was performed in the 30 RAO position.  It reveals mild-to-moderate left ventricular systolic dysfunction with an ejection fraction around 40%.  There is mild mitral regurgitation.  COMPLICATIONS:  None.  CONCLUSIONS:  Severe diffuse coronary artery disease.  I have reviewed the films with Dr. Lauree Chandler.  There are no discrete targets for revascularization.  She could be having angina for one of several different sites.  Unfortunately, her left ventricular systolic function is depressed - this is although certainly due to an ischemic cardiomyopathy.  We will need to continue with medical therapy for this subendocardial myocardial infarction.  Unfortunately, her long-term prognosis is not that greatsince we do not have any good options for revascularization.  She needs much more aggressive control of her diabetes.     Thayer Headings, M.D.     PJN/MEDQ  D:  10/13/2010  T:  10/13/2010  Job:  [id number]  cc:   Juanda Bond. Burt Knack, MD  Electronically Signed by Mertie Moores M.D. on 10/27/2010 09:53:06 AM

## 2010-10-27 NOTE — H&P (Signed)
[name] [name], [name]:  [id number]  MEDICAL RECORD NO.:  [id number]  LOCATION:  2904                         FACILITY:  [place]  PHYSICIAN:  Lovenia Shuck, MD   DATE OF BIRTH:  October 04, 1945  DATE OF ADMISSION:  10/13/2010 DATE OF DISCHARGE:                             HISTORY & PHYSICAL   PRIMARY CARE PROVIDER:  Dr. Woody Seller.  CARDIOLOGIST:  Satira Sark, MD in [place].  CHIEF COMPLAINT:  Chest pain.  HISTORY OF PRESENT ILLNESS:  [name] is a 65 year old white female with extensive vascular disease and diabetes who presented to [facility] Room tonight with intermittent chest pain over the last few days, followed by constant chest pain and shortness of breath that began tonight.  It was eventually relieved with IV nitroglycerin and Lasix.  She was found to have inferior lateral ST changes and was transferred from [facility] to the CCU here at [facility].  The patient reports that she has really had no recent problems until the last 3 days when she developed intermittent chest pain and shortness of breath. Today, her pain was constant beginning at 9:00 p.m. and was not relieved until she reached [facility] ER.  She does have some shortness of breath, but only minimal orthopnea and no PND.  She is not sure if she has noticed any weight gain recently.  She has not had fevers or cough.  She is now feeling much better.  She underwent left heart catheterization in October 2011 and was found to have diffuse three-vessel disease.  Due to her small vessel disease, she was not felt to be a good candidate for bypass surgery nor percutaneous coronary intervention and she has been treated medically since then.  PAST MEDICAL HISTORY: 1. Coronary artery disease:  Left heart cath in October 2011 showing     diffuse three-vessel disease and small vessel disease. 2. Diastolic heart failure. 3. History of left basal ganglia stroke. 4. Hypertension. 5.  Diabetes mellitus. 6. History of tobacco abuse, quit 1 year ago. 7. Hyperlipidemia. 8. Peripheral vascular disease status post carotid endarterectomy on     October 13, 2009. 9. TMJ syndrome. 10.Depression. 11.Recent right lower extremity cellulitis, possible osteomyelitis,     currently getting IV vancomycin treatments.  ALLERGIES:  SULFA causes swelling.  HOME MEDICATIONS:  She did not bring this with her today.  She is unable to provide them.  Her daughter will be bringing them in the morning to reconcile her medications.  It does appear that she is on 40 of lisinopril, but not currently on a beta-blocker, and she takes Lasix 40 mg twice a day as well as Lipitor 80 mg.  FAMILY HISTORY:  There is no history of early coronary artery disease.  SOCIAL HISTORY:  The patient is disabled.  She smoked heavily for 50 years but quit 1 year ago.  She does not drink alcohol.  REVIEW OF SYSTEMS:  Full review of systems is negative except as stated in HPI.  PHYSICAL EXAMINATION:  VITAL SIGNS:  Blood pressure 144/70, pulse 69, temperature afebrile, respirations 20, and O2 saturations 95% on room air. GENERAL:  No acute distress. HEENT:  Extraocular movements are intact.  Oropharynx benign. Nonicteric sclerae. NECK:  Supple. CARDIOVASCULAR:  Regular rate and rhythm without definite S3.  Jugular venous pressure is elevated. LUNGS:  There are crackles at the bases of lungs bilaterally. ABDOMEN:  Soft, nontender, and nondistended. EXTREMITIES:  There is no clubbing, cyanosis, or edema.  Pulses are intact throughout. NEUROLOGIC:  Grossly afocal.  Moves all extremities well.  She is alert and oriented x3. SKIN:  No rashes. LYMPHATIC:  No lymphadenopathy.  EKG shows normal sinus rhythm with LVH and lateral ST depression. Previously, she had T-wave inversions in these leads.  Chest x-ray shows pulmonary edema.  White count of 7.7, hemoglobin 11, and platelets 222.  BUN 31, creatinine  0.92.  Her troponin is 0.07.  Her INR is 1.0.  Her BNP is 443.  ASSESSMENT AND PLAN:  [name] is a 65 year old white female with diffuse coronary artery disease, insulin-dependent diabetes, and peripheral vascular disease who presents to the [facility] Emergency Room with chest pain and volume overload.  She did have lateral ST depression.  Her symptoms were improved with IV nitro as well as some Lasix.  She is feeling much better currently.  Her EKG is minimally changed from previous and she does have evidence of volume overload on exam. 1. Chest pain:  We will give her aspirin 325 as well as continue her     heparin drip and Plavix 75 daily which she takes at home.  We will     continue on a nitro drip and start on a low-dose beta-blocker as     tolerated.  Her revascularizations options are limited.  We will     follow her cardiac enzymes and see how she does. 2. Diastolic heart failure:  She is mildly volume overloaded on exam     and had chest x-ray here which has come back, is actually     impressive for pulmonary edema.  She does not have any symptoms     that are suggestive of pneumonia.  We will continue her IV diuresis     as I feel this is likely playing a large role in her symptoms. 3. Diabetes mellitus type 2.  We will check hemoglobin A1c and     continue her Lantus and hold her metformin. 4. Lower extremity infection or osteomyelitis:  She is on week 4 of 6     of vancomycin.  We will continue her IV vancomycin via PICC line     and we will obtain records from [facility] to see what they are     treating.  Her daughter plans to bring her medications in the morning where they can be reconciled.     Lovenia Shuck, MD     MMB/MEDQ  D:  10/13/2010  T:  10/13/2010  Job:  [id number]  cc:   Satira Sark, MD  Electronically Signed by Aletta Edouard MD on 10/26/2010 11:56:16 PM

## 2010-11-03 ENCOUNTER — Encounter: Payer: Self-pay | Admitting: Cardiovascular Disease

## 2010-11-03 ENCOUNTER — Ambulatory Visit (INDEPENDENT_AMBULATORY_CARE_PROVIDER_SITE_OTHER): Payer: Medicare Other | Admitting: Cardiovascular Disease

## 2010-11-03 VITALS — BP 119/62 | HR 60 | Ht 67.0 in | Wt 204.0 lb

## 2010-11-03 DIAGNOSIS — I5022 Chronic systolic (congestive) heart failure: Secondary | ICD-10-CM

## 2010-11-03 DIAGNOSIS — I2510 Atherosclerotic heart disease of native coronary artery without angina pectoris: Secondary | ICD-10-CM

## 2010-11-03 DIAGNOSIS — I509 Heart failure, unspecified: Secondary | ICD-10-CM

## 2010-11-03 DIAGNOSIS — I739 Peripheral vascular disease, unspecified: Secondary | ICD-10-CM

## 2010-11-03 NOTE — Assessment & Plan Note (Signed)
The patient had a recent non-ST elevation myocardial infarction. Her cardiac catheterization showed significant three-vessel coronary artery disease with a drop of her ejection fraction from 50% to 35-40%. I reviewed her catheter images. I agree that PCI is not an option due to the diffuse nature of her disease. However, I think there are reasonable targets for coronary artery bypass graft surgery. These include: LAD, large diagonal branch, OM and right PDA. Without revascularization, her overall prognosis is poor. I did explain to her that coronary artery bypass graft surgery would carry a higher risk than average due to her multiple comorbidities. Thus, I will go ahead and refer her for a surgical consult to consider coronary artery bypass graft surgery. I will keep her on Plavix for now which can be stopped once a surgery date has been set.

## 2010-11-03 NOTE — Progress Notes (Signed)
HPI  This is a 65 year old female who is here today for followup visit. She was hospitalized recently at [facility] where she presented with chest pain and was found to have non-ST elevation myocardial infarction. She underwent cardiac catheterization which showed diffuse 3 vessel coronary artery disease with an ejection fraction of 35-40%. Her previous ejection fraction late last year was 50%. There was no target lesion for percutaneous coronary intervention due to the diffuse nature of her disease. It was felt that she might not have good targets for coronary artery bypass graft surgery. She was treated medically. Spironolactone was added and she was discharged on Lasix 40 mg twice daily. She denies any recurrent chest pain. However, she still has significant dyspnea. Her lower extremity edema resolved. She has been complaining of dizziness and vomiting over the last few weeks. She had labs done early this week which showed an elevation of her creatinine to 1.67. Her Lasix was stopped. She had repeat labs done today which showed improvement of creatinine to 1.47.  Allergies  Allergen Reactions  . Sulfonamide Derivatives     REACTION: SWELLING     Current Outpatient Prescriptions on File Prior to Visit  Medication Sig Dispense Refill  . atorvastatin (LIPITOR) 80 MG tablet TAKE ONE TABLET BY MOUTH AT BEDTIME EMERGENCY REFILL FAXED DR  30 tablet  6  . clopidogrel (PLAVIX) 75 MG tablet Take 1 tablet (75 mg total) by mouth daily.  30 tablet  11  . escitalopram (LEXAPRO) 10 MG tablet Take 10 mg by mouth daily.        [name] glimepiride (AMARYL) 4 MG tablet Take 4 mg by mouth 2 (two) times daily.        . insulin glargine (LANTUS) 100 UNIT/ML injection Inject into the skin at bedtime.        [name] lisinopril (PRINIVIL,ZESTRIL) 40 MG tablet Take 1 tablet (40 mg total) by mouth daily.  30 tablet  6  . metFORMIN (GLUCOPHAGE) 500 MG tablet Take 500 mg by mouth 2 (two) times daily with a meal.        .  nitroGLYCERIN (NITROSTAT) 0.4 MG SL tablet Place 0.4 mg under the tongue every 5 (five) minutes as needed.           Past Medical History  Diagnosis Date  . Coronary atherosclerosis of native coronary artery     Multivessel  . Chronic diastolic heart failure   . Type 2 diabetes mellitus   . Mixed hyperlipidemia   . Essential hypertension, benign   . Depression   . Cellulitis     Recurrent, bilateral legs  . Arthritis   . Carotid artery disease   . PAD (peripheral artery disease)   . Chronic systolic congestive heart failure     EF 35-40% due to ischemic cardiomyopathy     Past Surgical History  Procedure Date  . Appendectomy   . Cholecystectomy   . Abdominal hysterectomy   . Cardiac catheterization 11/2009    EF 50%  . Cardiac catheterization 10/2010    significant 3 vessel CAD. EF 35-40%.   . Carotid endarterectomy     Bilateral - Dr. Kellie Simmering     Family History  Problem Relation Age of Onset  . Coronary artery disease Father     Died with MI age 43  . Diabetes type II Mother   . Hypertension Mother   . Heart failure Sister   . Cancer Brother     Lung cancer  History   Social History  . Marital Status: Widowed    Spouse Name: N/A    Number of Children: N/A  . Years of Education: N/A   Occupational History  . Not on file.   Social History Main Topics  . Smoking status: Former Smoker -- 2.0 packs/day for 50 years    Types: Cigarettes    Quit date: 10/09/2009  . Smokeless tobacco: Never Used   Comment: SMOKED FOR 50 YEARS  . Alcohol Use: Yes     Occasional  . Drug Use: No  . Sexually Active: Not on file   Other Topics Concern  . Not on file   Social History Narrative  . No narrative on file     PHYSICAL EXAM   BP 119/62  Pulse 60  Ht _0  (1.702 m)  Wt 204 lb (92.534 kg)  BMI 31.95 kg/m2  Constitutional: She is oriented to person, place, and time. She appears well-developed and well-nourished. No distress.  HENT: No nasal  discharge.  Head: Normocephalic and atraumatic.  Eyes: Pupils are equal, round, and reactive to light. Right eye exhibits no discharge. Left eye exhibits no discharge.  Neck: Normal range of motion. Neck supple. No JVD present. No thyromegaly present.  Cardiovascular: Normal rate, regular rhythm, normal heart sounds. Exam reveals no gallop and no friction rub.  No murmur heard.  Pulmonary/Chest: Effort normal and breath sounds normal. No stridor. No respiratory distress. She has no wheezes. She has no rales. She exhibits no tenderness.  Abdominal: Soft. Bowel sounds are normal. She exhibits no distension. There is no tenderness. There is no rebound and no guarding.  Musculoskeletal: Normal range of motion. She exhibits no edema and no tenderness.  Neurological: She is alert and oriented to person, place, and time. Coordination normal.  Skin: Skin is warm and dry. No rash noted. She is not diaphoretic. No erythema. No pallor.  Psychiatric: She has a normal mood and affect. Her behavior is normal. Judgment and thought content normal.      ASSESSMENT AND PLAN

## 2010-11-03 NOTE — Assessment & Plan Note (Signed)
She is on good medical therapy at this time. Her Lasix was discontinued due to worsening renal function. Will keep her on Aldactone for now.

## 2010-11-03 NOTE — Assessment & Plan Note (Signed)
The patient already has a future appointment scheduled with Dr. Oneida Alar to evaluate this.

## 2010-11-03 NOTE — Patient Instructions (Signed)
   Referral to [facility] for evaluation for bypass surgery. Your physician recommends that you continue on your current medications as directed. Please refer to the Current Medication list given to you today.

## 2010-11-04 ENCOUNTER — Institutional Professional Consult (permissible substitution) (INDEPENDENT_AMBULATORY_CARE_PROVIDER_SITE_OTHER): Payer: Medicare Other | Admitting: Cardiothoracic Surgery

## 2010-11-04 ENCOUNTER — Encounter: Payer: Self-pay | Admitting: Cardiothoracic Surgery

## 2010-11-04 VITALS — BP 132/59 | HR 68 | Resp 20 | Ht 66.0 in | Wt 207.0 lb

## 2010-11-04 DIAGNOSIS — I739 Peripheral vascular disease, unspecified: Secondary | ICD-10-CM

## 2010-11-04 DIAGNOSIS — I519 Heart disease, unspecified: Secondary | ICD-10-CM

## 2010-11-04 DIAGNOSIS — I2510 Atherosclerotic heart disease of native coronary artery without angina pectoris: Secondary | ICD-10-CM

## 2010-11-04 DIAGNOSIS — Z8673 Personal history of transient ischemic attack (TIA), and cerebral infarction without residual deficits: Secondary | ICD-10-CM

## 2010-11-04 NOTE — Progress Notes (Signed)
PCP is VYAS,DHRUV B., MD, MD Referring Provider is Wellington Hampshire, MD                        [address]            [place] [address]          [phone]     Chief Complaint  Patient presents with  . Coronary Artery Disease    Referral from Dr Fletcher Anon for surgical eval, CABG    HPI:  I was asked to evaluate this 65 year old diabetic ex-smoker for possible multivessel bypass grafting. The patient was recently hospitalized earlier this month for a non-ST elevation MI. She underwent a cardiac catheterization which revealed high-grade 90% stenosis of both the right coronary and circumflex systems with out significant disease of the LAD. Her ejection fraction was approximately 40-45%. She was not felt to be a candidate for PCI and she was discharged home on medical therapy including Plavix and Ranexa. She was recently seen in followup by Dr. Lovette Cliche who felt that the patient should be evaluated for surgery despite having 2 vessel disease and suboptimal targets for grafting. The patient did have chest pain at the time of her MRI but has not had recurrent angina following discharge. She denies exertional shortness of breath. She does have orthostatic dizziness. She appears to be compliant with her medications and stopped smoking over a year ago.  The patient presents now for evaluation of possible surgical revascularization for two-vessel disease, moderate LV dysfunction, and suboptimal targets but graftable vessels.  Past Medical History  Diagnosis Date  . Coronary atherosclerosis of native coronary artery     Multivessel  . Chronic diastolic heart failure   . Type 2 diabetes mellitus   . Mixed hyperlipidemia   . Essential hypertension, benign   . Depression   . Cellulitis     Recurrent, bilateral legs  . Arthritis   . Carotid artery disease   . PAD (peripheral artery disease)   . Chronic systolic congestive heart failure     EF 35-40% due to ischemic cardiomyopathy      Past Surgical History  Procedure Date  . Appendectomy   . Cholecystectomy   . Abdominal hysterectomy   . Cardiac catheterization 11/2009    EF 50%  . Cardiac catheterization 10/2010    significant 3 vessel CAD. EF 35-40%.   . Carotid endarterectomy     Bilateral - Dr. Kellie Simmering    Family History  Problem Relation Age of Onset  . Coronary artery disease Father     Died with MI age 52  . Diabetes type II Mother   . Hypertension Mother   . Heart failure Sister   . Cancer Brother     Lung cancer    Social History History  Substance Use Topics  . Smoking status: Former Smoker -- 2.0 packs/day for 50 years    Types: Cigarettes    Quit date: 10/09/2009  . Smokeless tobacco: Never Used   Comment: SMOKED FOR 50 YEARS  . Alcohol Use: Yes     Occasional    Current Outpatient Prescriptions  Medication Sig Dispense Refill  . amLODipine (NORVASC) 5 MG tablet Take 1 tablet by mouth Daily.      [name] aspirin EC 81 MG tablet Take 81 mg by mouth daily.        [name] atorvastatin (LIPITOR) 80 MG tablet TAKE ONE TABLET BY MOUTH AT BEDTIME EMERGENCY REFILL  FAXED DR  30 tablet  6  . carvedilol (COREG) 12.5 MG tablet Take 1 tablet by mouth 2 (two) times daily with a meal.      . clopidogrel (PLAVIX) 75 MG tablet Take 1 tablet (75 mg total) by mouth daily.  30 tablet  11  . escitalopram (LEXAPRO) 10 MG tablet Take 10 mg by mouth daily.        [name] glimepiride (AMARYL) 4 MG tablet Take 4 mg by mouth 2 (two) times daily.        . insulin glargine (LANTUS) 100 UNIT/ML injection Inject into the skin at bedtime.        . insulin lispro (HUMALOG) 100 UNIT/ML injection Inject into the skin 3 (three) times daily before meals.        . isosorbide mononitrate (IMDUR) 60 MG 24 hr tablet Take 1 tablet by mouth Daily.      [name] lisinopril (PRINIVIL,ZESTRIL) 40 MG tablet Take 1 tablet (40 mg total) by mouth daily.  30 tablet  6  . meclizine (ANTIVERT) 25 MG tablet Take 1 tablet by mouth Twice daily as needed.      .  metFORMIN (GLUCOPHAGE) 500 MG tablet Take 500 mg by mouth 2 (two) times daily with a meal.        . nitroGLYCERIN (NITROSTAT) 0.4 MG SL tablet Place 0.4 mg under the tongue every 5 (five) minutes as needed.        . pantoprazole (PROTONIX) 40 MG tablet Take 1 tablet by mouth Daily.      . promethazine (PHENERGAN) 25 MG tablet Take 1 tablet by mouth 4 times daily as needed.      [name] RANEXA 500 MG 12 hr tablet Take 1 tablet by mouth Twice daily.      [name] spironolactone (ALDACTONE) 25 MG tablet Take 1 tablet by mouth Daily.        Allergies  Allergen Reactions  . Sulfonamide Derivatives     REACTION: SWELLING    Review of Systems  Gen. review is negative for weight loss or fever. HEENT significant for total dental plates, no difficulty swallowing, no change in vision. THORAX no history of, recent symptoms of upper respiratory infection or hemoptysis, no history of pneumothorax.. . CARDIAC  Positive history of 2 prior MI. No history of arrhythmia or valvular disease. No history of rheumatic fever.   . GI status post cholecystectomy, no history of jaundice hepatitis blood per rectum or ulcer disease. No history of chronic abdominal pain.[name] EXTREMITIES no history of severe arthritis or gout. 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 Kellie Simmering. She had a stroke prior to the first procedure. She denies claudication. She does have varicosities.. Severe varicosities of the left leg moderate to severe varicosities of the right leg. NEUROLOGIC she has some residual right-sided weakness following her old stroke.HEMATOLOGIC no history of bleeding problems results of P2 Y. 12 unknown.   BP 132/59  Pulse 68  Resp 20  Ht _0  (1.676 m)  Wt 207 lb (93.895 kg)  BMI 33.41 kg/m2  SpO2 95% Physical Exam  GENERAL APPEARANCE middle-aged Caucasian female accompanied by her daughter in no  acute distress HEENT pupils equal , no JVD, left carotid bruit present, bilateral carotid surgical incisions LYMPHATICS no palpable adenopathy in the neck or supraclavicular fossa. THORAX no deformity tenderness breath sounds clear and  equal bilateral. CARDIAC regular rhythm grade 6-1/8 systolic murmur, no gallop or ectopy. ABDOMEN soft obese nontender without organomegaly or pulsatile mass EXTREMITIES venous stasis changes of the pretibial areas bilaterally. Healed neuropathic ulcer of the right foot, no clubbing cyanosis or edema. VASCULAR palpable radial and ulnar pulses palpable femoral pulses nonpalpable pedal pulses. Severe left leg varicosities. Moderate to severe right leg varicosities. NEURO alert and oriented. She walks with slight limp from  right-sided weakness.   Diagnostic Tests: I reviewed the patient's 2-D echo and left heart catheter. She has LVH, mild MR, and EF of 40%. She is diabetic pattern coronary disease with high-grade stenosis of the right coronary distribution in the circumflex. Her LAD has mild disease. LVEDP was moderately elevated. Estimated pulmonary artery pressures on echo were also elevated at 50 mm mercury systolic.  Impression This is a middle-aged diabetic ex-smoker with two-vessel coronary disease status post 2 prior MIs with a decline in ejection fraction now approximately 40%. She is currently stable on medical therapy but appears to be at risk for progressive disease and further MI damage. If the LAD has significant disease she would definitely drive significant benefit from surgical rationalization. However with two-vessel disease and progressive decrease in LV function from myocardial infarctions I believe that if her conduit is adequate then surgery would be the best choice at this point.   Plan  We'll obtain vein mapping of her leg vein and pre- CABG vascular studies to determine if radial artery graft would be possible. She will then return to the  office for discussion and potentially schedule surgery. We'll continue the Plavix at this time.[name]

## 2010-11-04 NOTE — Patient Instructions (Signed)
Continue plavix for now

## 2010-11-08 NOTE — Discharge Summary (Signed)
[name] [name], [name] [name]                 ACCOUNT NO.:  [id number]  MEDICAL RECORD NO.:  [id number]  LOCATION:  2040                         FACILITY:  [facility]  PHYSICIAN:  Kathlyn Sacramento, MD     DATE OF BIRTH:  11/11/1945  DATE OF ADMISSION:  10/13/2010 DATE OF DISCHARGE:  10/19/2010                              DISCHARGE SUMMARY   PROCEDURES: 1. Cardiac catheterization. 2. Coronary arteriogram. 3. Left ventriculogram. 4. Two-D echocardiogram. 5. Portable chest x-ray.  PRIMARY FINAL DISCHARGE DIAGNOSIS:  Non ST-segment elevation myocardial infarction.  SECONDARY DIAGNOSES: 1. Acute systolic congestive heart failure. 2. Acute-on-chronic diastolic congestive heart failure. 3. Diabetes. 4. Hypertension. 5. Hyperlipidemia. 6. Remote history of tobacco use. 7. History of left basal ganglia cerebrovascular accident. 8. Peripheral vascular disease status post left CEA in 2011. 9. History of temporomandibular joint syndrome. 10.Depression. 11.Right foot cellulitis possibly osteomyelitis on intravenous     vancomycin. 12.Allergy or intolerance to sulfa.  TIME AT DISCHARGE:  48 minutes.  HOSPITAL COURSE:  [name] [name] is a 65 year old female with a history of coronary artery disease.  She had chest pain and shortness of breath on the day of admission.  She went to [facility] where her troponin was elevated and she had EKG changes, so she was transferred urgently to [facility].  Her cardiac enzymes were elevated indicating a non ST-segment elevation MI.  Her peak CK-MB was 733/82.5 with a peak troponin of 20.05.  Other labs of note are an HDL of 31 with an LDL of 64 and a hemoglobin A1c of 10.0.  She was also hypokalemic on admission with a potassium of 3.2. She was taken to the cath lab on October 13, 2010.  The cardiac catheterization showed LAD 50%, D1 99%, circumflex 90%, distal branches of the circumflex 90%, PDA 80% and 90%, PL 95% and 50%, EF 40%.  The films were reviewed  by Dr. Acie Fredrickson and Dr. Angelena Form.  There were no discrete targets for revascularization and no clear culprit vessels. Medical therapy was felt to be the only option.  [name] [name] had some problems with heart failure and was diuresed.  As her volume status improved, she was placed back on her home dose of Lasix. Her potassium had been low on admission, and she was started on a supplement.  She also has spironolactone added to her medication regimen and her renal function will be followed closely.  She had some anemia with a hemoglobin of 10.8 on admission.  This was monitored carefully and at discharge her hemoglobin was 10.3 with hematocrit of 31.1.  She is to follow up with primary care for this.  She is not currently on iron supplementation.  [name] [name] had been on IV vancomycin prior to admission for a lower extremity infection and this was continued.  She had some problems with chest pain post cath and had antianginals added to her medication regimen, including Imdur, Norvasc, and Ranexa.  Her chest pain was relieved by nitroglycerin.  By October 19, 2010, she was ambulating with cardiac rehab 800 feet and had no chest pain.  Her room air sats were 96% after ambulation.  On October 19, 2010, [name] [name]  was evaluated by Dr. Fletcher Anon.  Her volume status was felt to be at baseline and she was having no anginal symptoms.  She is considered stable for discharge, to follow up closely in the office.  DISCHARGE INSTRUCTIONS:  Her activity level is to be increased gradually with no driving for 3 days and no lifting for 2 weeks.  She is encouraged to stick to a low-sodium diabetic diet.  She is to call our office for problems with the cath site.  She is to follow up with Dr. Fletcher Anon for Dr. Domenic Polite on November 03, 2010, at 11:15.  She is to follow up with Dr. Woody Seller as needed.  DISCHARGE MEDICATIONS: 1. Aldactone 25 mg a day. 2. Amlodipine 5 mg a day. 3. Coreg 12.5 mg b.i.d. 4. Imdur 60 mg a  day. 5. Sublingual nitroglycerin p.r.n. 6. Protonix 40 mg a day. 7. Potassium 20 mEq 2 tabs daily. 8. Ranexa 500 mg b.i.d. 9. Aspirin 81 mg a day. 10.Plavix 75 mg a day. 11.Lexapro 10 mg a day. 12.Lasix 40 mg b.i.d. 13.Amaryl 4 mg b.i.d. 14.Humalog lispro sliding scale as prior to admission. 15.Lantus 15 units a.m. and 35 units p.m. 16.Lipitor 80 mg a day. 17.Lisinopril 40 mg a day. 18.Metformin 500 mg b.i.d. 19.Vancomycin q.12 hours as prior to admission.     Rosaria Ferries, PA-C   ______________________________ Kathlyn Sacramento, MD    RB/MEDQ  D:  10/19/2010  T:  10/19/2010  Job:  [id number]  cc:   Jerene Bears, MD  Electronically Signed by Rosaria Ferries PA-C on 10/27/2010 06:26:04 AM Electronically Signed by Kathlyn Sacramento MD on 11/08/2010 03:54:14 PM

## 2010-11-09 ENCOUNTER — Ambulatory Visit (HOSPITAL_COMMUNITY)
Admission: RE | Admit: 2010-11-09 | Discharge: 2010-11-09 | Disposition: A | Discharge status: Home / Self Care | Payer: Medicare Other | Source: Ambulatory Visit | Attending: Cardiothoracic Surgery | Admitting: Cardiothoracic Surgery

## 2010-11-09 DIAGNOSIS — Z01810 Encounter for preprocedural cardiovascular examination: Secondary | ICD-10-CM | POA: Insufficient documentation

## 2010-11-09 DIAGNOSIS — I6529 Occlusion and stenosis of unspecified carotid artery: Secondary | ICD-10-CM | POA: Insufficient documentation

## 2010-11-09 DIAGNOSIS — I658 Occlusion and stenosis of other precerebral arteries: Secondary | ICD-10-CM | POA: Insufficient documentation

## 2010-11-09 DIAGNOSIS — I2510 Atherosclerotic heart disease of native coronary artery without angina pectoris: Secondary | ICD-10-CM | POA: Insufficient documentation

## 2010-11-10 DIAGNOSIS — I214 Non-ST elevation (NSTEMI) myocardial infarction: Secondary | ICD-10-CM

## 2010-11-10 HISTORY — DX: Non-ST elevation (NSTEMI) myocardial infarction: I21.4

## 2010-11-10 HISTORY — PX: CARDIAC CATHETERIZATION: SHX172

## 2010-11-16 ENCOUNTER — Other Ambulatory Visit: Payer: Self-pay | Admitting: Cardiothoracic Surgery

## 2010-11-16 ENCOUNTER — Encounter: Payer: Self-pay | Admitting: Cardiothoracic Surgery

## 2010-11-16 ENCOUNTER — Ambulatory Visit (INDEPENDENT_AMBULATORY_CARE_PROVIDER_SITE_OTHER): Payer: Medicare Other | Admitting: Cardiothoracic Surgery

## 2010-11-16 VITALS — BP 114/58 | HR 62 | Resp 18 | Ht 67.0 in | Wt 204.0 lb

## 2010-11-16 DIAGNOSIS — I739 Peripheral vascular disease, unspecified: Secondary | ICD-10-CM

## 2010-11-16 DIAGNOSIS — E119 Type 2 diabetes mellitus without complications: Secondary | ICD-10-CM

## 2010-11-16 DIAGNOSIS — I2510 Atherosclerotic heart disease of native coronary artery without angina pectoris: Secondary | ICD-10-CM

## 2010-11-16 DIAGNOSIS — I252 Old myocardial infarction: Secondary | ICD-10-CM

## 2010-11-16 NOTE — Patient Instructions (Signed)
Stop plavix now. Stop metformin 2 days prior to surgery

## 2010-11-16 NOTE — Progress Notes (Signed)
PCP is VYAS,DHRUV B., MD, MD Referring Provider is Wellington Hampshire, MD                     [address]            [place] [address]          [phone]    Chief Complaint  Patient presents with  . Coronary Artery Disease    further discussion    HPI: A the patient is a 65 year old Caucasian female diabetic ex-smoker with severe multivessel coronary disease returns for her second evaluation to schedule multivessel bypass grafting. The patient was admitted to the hospital last month with a non-ST elevation MI associated with shortness of breath and pulmonary edema. Subsequent cardiac catheterization demonstrated high-grade stenosis of the RCA and circumflex with EF of 40%. At that time a surgical consultation was not requested and the patient was treated medically as it was not felt her targets were sub optimal and the amount of LAD disease was only moderate. The patient was placed on multiple medications as listed but had persistent nausea dizziness and shortness of breath. Dr. Velva Harman reevaluated the patient in his clinic and felt that the patient should be evaluated for surgical coronary revascularization as she had had a significant drop in her ejection fraction associated with the myocardial infarction.   I evaluated the patient in the office 10 days ago and felt that she was potentially graftable but due to her history of peripheral rash or disease I would need pre-CABG Doppler  Information and mapping of her saphenous vein to assess adequacy of conduit. The patient has completely stop smoking as of a year ago. The patient is on chronic Plavix. The carotid Doppler exam shows that following bilateral carotid endarterectomy she has moderate carotid stenosis estimated at 50-60%. Brachial artery pressures are equal bilaterally and ABIs are 0.6-0.7. Palmore arch and one studies are positive in both upper extremity. Perfusion to her lower extremity is better on the right than left.  Vein mapping shows adequate and compressible saphenous vein bilaterally with right vein appearing somewhat better the left leg. PFTs in the office demonstrate FEV1 of 2.1 and oxygen saturation is 94% on room air. Chest x-ray is pending. The patient presents now to discuss the indications risks and details of surgery to make a decision and to schedule potential operation. Past Medical History  Diagnosis Date  . Coronary atherosclerosis of native coronary artery     Multivessel  . Chronic diastolic heart failure   . Type 2 diabetes mellitus   . Mixed hyperlipidemia   . Essential hypertension, benign   . Depression   . Cellulitis     Recurrent, bilateral legs  . Arthritis   . Carotid artery disease   . PAD (peripheral artery disease)   . Chronic systolic congestive heart failure     EF 35-40% due to ischemic cardiomyopathy    Past Surgical History  Procedure Date  . Appendectomy   . Cholecystectomy   . Abdominal hysterectomy   . Cardiac catheterization 11/2009    EF 50%  . Cardiac catheterization 10/2010    significant 3 vessel CAD. EF 35-40%.   . Carotid endarterectomy     Bilateral - Dr. Kellie Simmering    Family History  Problem Relation Age of Onset  . Coronary artery disease Father     Died with MI age 87  . Diabetes type II Mother   . Hypertension Mother   .  Heart failure Sister   . Cancer Brother     Lung cancer    Social History History  Substance Use Topics  . Smoking status: Former Smoker -- 2.0 packs/day for 50 years    Types: Cigarettes    Quit date: 10/09/2009  . Smokeless tobacco: Never Used   Comment: SMOKED FOR 50 YEARS  . Alcohol Use: Yes     Occasional    Current Outpatient Prescriptions  Medication Sig Dispense Refill  . amLODipine (NORVASC) 5 MG tablet Take 1 tablet by mouth Daily.      [name] aspirin EC 81 MG tablet Take 81 mg by mouth daily.        [name] atorvastatin (LIPITOR) 80 MG tablet TAKE ONE TABLET BY MOUTH AT BEDTIME EMERGENCY REFILL FAXED DR  30  tablet  6  . carvedilol (COREG) 12.5 MG tablet Take 1 tablet by mouth 2 (two) times daily with a meal.      . clopidogrel (PLAVIX) 75 MG tablet Take 1 tablet (75 mg total) by mouth daily.  30 tablet  11  . escitalopram (LEXAPRO) 10 MG tablet Take 10 mg by mouth daily.        [name] glimepiride (AMARYL) 4 MG tablet Take 4 mg by mouth 2 (two) times daily.        . insulin glargine (LANTUS) 100 UNIT/ML injection Inject into the skin at bedtime.        . insulin lispro (HUMALOG) 100 UNIT/ML injection Inject into the skin 3 (three) times daily before meals.        . isosorbide mononitrate (IMDUR) 60 MG 24 hr tablet Take 1 tablet by mouth Daily.      [name] lisinopril (PRINIVIL,ZESTRIL) 40 MG tablet Take 1 tablet (40 mg total) by mouth daily.  30 tablet  6  . metFORMIN (GLUCOPHAGE) 500 MG tablet Take 500 mg by mouth 2 (two) times daily with a meal.        . nitroGLYCERIN (NITROSTAT) 0.4 MG SL tablet Place 0.4 mg under the tongue every 5 (five) minutes as needed.        . pantoprazole (PROTONIX) 40 MG tablet Take 1 tablet by mouth Daily.      [name] RANEXA 500 MG 12 hr tablet Take 1 tablet by mouth Twice daily.      [name] spironolactone (ALDACTONE) 25 MG tablet Take 1 tablet by mouth Daily.      . meclizine (ANTIVERT) 25 MG tablet Take 1 tablet by mouth Twice daily as needed.      . promethazine (PHENERGAN) 25 MG tablet Take 1 tablet by mouth 4 times daily as needed.        Allergies  Allergen Reactions  . Sulfonamide Derivatives     REACTION: SWELLING    Review of Systems no change from her previous office visit 10 days ago.  BP 114/58  Pulse 62  Resp 18  Ht _0  (1.702 m)  Wt 204 lb (92.534 kg)  BMI 31.95 kg/m2  SpO2 94% Physical Exam GENERAL middle-aged Caucasian female alert and responsive in no acute distress HEENT pupils equal dentition good neck without mass or JVD. Bilateral carotid endarterectomy scars, bilateral soft bruit THORAX breath sounds clear and equal no tenderness CARDIAC regular  rhythm grade 1/6 holosystolic murmur no S3 gallop. ABDOMEN obese soft nontender without organomegaly or pulsatile mass.  EXTREMITIES mild clubbing no cyanosis tenderness minimal edema no significant venous insufficiency of the lower extremities. NEUROLOGIC alert and oriented without focal motor deficit  Diagnostic Tests:  Three-vessel coronary disease with high-grade RCA and circumflex stenosis moderate 40-50% LAD stenosis reduced LVEF 35-40%. No significant MR by echo.  Impression and Plan:   Middle-aged diabetic patient status post subendocardial MI with significant reduction in LV function and residual multivessel disease would appear to be a potential candidate for surgical repair sedation to preserved LV function and to improve survival. The procedure risks benefits and alternatives were discussed the patient has decided to proceed with surgery as planned. The procedure will be scheduled for November 15 at [facility] hospital.

## 2010-11-18 ENCOUNTER — Other Ambulatory Visit: Payer: Self-pay

## 2010-11-18 ENCOUNTER — Encounter (HOSPITAL_COMMUNITY): Payer: Self-pay | Admitting: Pharmacy Technician

## 2010-11-18 DIAGNOSIS — I2510 Atherosclerotic heart disease of native coronary artery without angina pectoris: Secondary | ICD-10-CM

## 2010-11-21 ENCOUNTER — Encounter: Payer: Self-pay | Admitting: Vascular Surgery

## 2010-11-22 ENCOUNTER — Ambulatory Visit (HOSPITAL_COMMUNITY)
Admission: RE | Admit: 2010-11-22 | Discharge: 2010-11-22 | Disposition: A | Discharge status: Home / Self Care | Payer: Medicare Other | Source: Ambulatory Visit | Attending: Cardiothoracic Surgery | Admitting: Cardiothoracic Surgery

## 2010-11-22 ENCOUNTER — Encounter (HOSPITAL_COMMUNITY)
Admission: RE | Admit: 2010-11-22 | Discharge: 2010-11-22 | Disposition: A | Discharge status: Home / Self Care | Payer: Medicare Other | Source: Ambulatory Visit | Attending: Cardiothoracic Surgery | Admitting: Cardiothoracic Surgery

## 2010-11-22 ENCOUNTER — Ambulatory Visit (HOSPITAL_COMMUNITY)
Admission: RE | Admit: 2010-11-22 | Discharge: 2010-11-22 | Payer: Medicare Other | Source: Ambulatory Visit | Attending: Cardiothoracic Surgery | Admitting: Cardiothoracic Surgery

## 2010-11-22 ENCOUNTER — Other Ambulatory Visit (HOSPITAL_COMMUNITY): Payer: Medicare Other

## 2010-11-22 ENCOUNTER — Other Ambulatory Visit: Payer: Self-pay

## 2010-11-22 ENCOUNTER — Encounter (HOSPITAL_COMMUNITY): Payer: Self-pay

## 2010-11-22 DIAGNOSIS — I369 Nonrheumatic tricuspid valve disorder, unspecified: Secondary | ICD-10-CM

## 2010-11-22 DIAGNOSIS — R0989 Other specified symptoms and signs involving the circulatory and respiratory systems: Secondary | ICD-10-CM | POA: Insufficient documentation

## 2010-11-22 DIAGNOSIS — I2510 Atherosclerotic heart disease of native coronary artery without angina pectoris: Secondary | ICD-10-CM

## 2010-11-22 DIAGNOSIS — I379 Nonrheumatic pulmonary valve disorder, unspecified: Secondary | ICD-10-CM | POA: Insufficient documentation

## 2010-11-22 DIAGNOSIS — I079 Rheumatic tricuspid valve disease, unspecified: Secondary | ICD-10-CM | POA: Insufficient documentation

## 2010-11-22 DIAGNOSIS — R0609 Other forms of dyspnea: Secondary | ICD-10-CM | POA: Insufficient documentation

## 2010-11-22 DIAGNOSIS — I059 Rheumatic mitral valve disease, unspecified: Secondary | ICD-10-CM | POA: Insufficient documentation

## 2010-11-22 DIAGNOSIS — E119 Type 2 diabetes mellitus without complications: Secondary | ICD-10-CM | POA: Insufficient documentation

## 2010-11-22 DIAGNOSIS — I10 Essential (primary) hypertension: Secondary | ICD-10-CM | POA: Insufficient documentation

## 2010-11-22 HISTORY — DX: Reserved for concepts with insufficient information to code with codable children: IMO0002

## 2010-11-22 LAB — URINE MICROSCOPIC-ADD ON

## 2010-11-22 LAB — COMPREHENSIVE METABOLIC PANEL
ALT: 15 U/L (ref 0–35)
AST: 18 U/L (ref 0–37)
Albumin: 3.6 g/dL (ref 3.5–5.2)
Alkaline Phosphatase: 85 U/L (ref 39–117)
BUN: 22 mg/dL (ref 6–23)
CO2: 21 mEq/L (ref 19–32)
Calcium: 9.5 mg/dL (ref 8.4–10.5)
Chloride: 102 mEq/L (ref 96–112)
Creatinine, Ser: 0.98 mg/dL (ref 0.50–1.10)
GFR calc Af Amer: 70 mL/min — ABNORMAL LOW (ref >90)
GFR calc non Af Amer: 60 mL/min — ABNORMAL LOW (ref >90)
Glucose, Bld: 256 mg/dL — ABNORMAL HIGH (ref 70–99)
Potassium: 4.3 mEq/L (ref 3.5–5.1)
Sodium: 135 mEq/L (ref 135–145)
Total Bilirubin: 0.3 mg/dL (ref 0.3–1.2)
Total Protein: 7.4 g/dL (ref 6.0–8.3)

## 2010-11-22 LAB — BLOOD GAS, ARTERIAL
Acid-base deficit: 0.1 mmol/L (ref 0.0–2.0)
Bicarbonate: 24 mEq/L (ref 20.0–24.0)
Drawn by: 206361
FIO2: 0.21 %
O2 Saturation: 97 %
Patient temperature: 98.6
TCO2: 25.2 mmol/L (ref 0–100)
pCO2 arterial: 39.3 mmHg (ref 35.0–45.0)
pH, Arterial: 7.404 — ABNORMAL HIGH (ref 7.350–7.400)
pO2, Arterial: 88.3 mmHg (ref 80.0–100.0)

## 2010-11-22 LAB — CBC
HCT: 33.1 % — ABNORMAL LOW (ref 36.0–46.0)
Hemoglobin: 11.4 g/dL — ABNORMAL LOW (ref 12.0–15.0)
MCH: 28.1 pg (ref 26.0–34.0)
MCHC: 34.4 g/dL (ref 30.0–36.0)
MCV: 81.5 fL (ref 78.0–100.0)
Platelets: 172 10*3/uL (ref 150–400)
RBC: 4.06 MIL/uL (ref 3.87–5.11)
RDW: 14.7 % (ref 11.5–15.5)
WBC: 6.1 10*3/uL (ref 4.0–10.5)

## 2010-11-22 LAB — APTT: aPTT: 28 seconds (ref 24–37)

## 2010-11-22 LAB — SURGICAL PCR SCREEN
MRSA, PCR: NEGATIVE
Staphylococcus aureus: NEGATIVE

## 2010-11-22 LAB — URINALYSIS, ROUTINE W REFLEX MICROSCOPIC
Bilirubin Urine: NEGATIVE
Glucose, UA: >1000 mg/dL — AB
Hgb urine dipstick: NEGATIVE
Ketones, ur: NEGATIVE mg/dL
Leukocytes, UA: NEGATIVE
Nitrite: NEGATIVE
Protein, ur: NEGATIVE mg/dL
Specific Gravity, Urine: 1.024 (ref 1.005–1.030)
Urobilinogen, UA: 0.2 mg/dL (ref 0.0–1.0)
pH: 5.5 (ref 5.0–8.0)

## 2010-11-22 LAB — PROTIME-INR
INR: 1.04 (ref 0.00–1.49)
Prothrombin Time: 13.8 seconds (ref 11.6–15.2)

## 2010-11-22 LAB — PULMONARY FUNCTION TEST

## 2010-11-22 LAB — HEMOGLOBIN A1C
Hgb A1c MFr Bld: 8.1 % — ABNORMAL HIGH (ref <5.7)
Mean Plasma Glucose: 186 mg/dL — ABNORMAL HIGH (ref <117)

## 2010-11-22 MED ORDER — CHLORHEXIDINE GLUCONATE 4 % EX LIQD: 30.0000 mL | CUTANEOUS | Status: DC

## 2010-11-22 NOTE — Progress Notes (Signed)
Pt scheduled for PFT and pre surgical doppler studies after PAT visit. Pt aware of appointments and lab (pbt) to assist pt is in getting to appointment.

## 2010-11-22 NOTE — Pre-Procedure Instructions (Signed)
[place]  11/22/2010   Your procedure is scheduled on:  November 24, 2010  Report to [place] at [place].  Call this number if you have problems the morning of surgery: [phone]   Remember:   Do not eat food:After Midnight.  Do not drink clear liquids: 4 Hours before arrival.  Take these medicines the morning of surgery with A SIP OF WATER: amlodipidine (Norvasc), carvedilol (Coreg), isosorbide mononitrate (Imdur), pantoprazole (Protonix),   Do not wear jewelry, make-up or nail polish.  Do not wear lotions, powders, or perfumes. You may wear deodorant.  Do not shave 48 hours prior to surgery.  Do not bring valuables to the hospital.  Contacts, dentures or bridgework may not be worn into surgery.  Leave suitcase in the car. After surgery it may be brought to your room.  For patients admitted to the hospital, checkout time is 11:00 AM the day of discharge.   Patients discharged the day of surgery will not be allowed to drive home.  Special Instructions: Incentive Spirometry - Practice and bring it with you on the day of surgery. and CHG Shower Use Special Wash: 1/2 bottle night before surgery and 1/2 bottle morning of surgery.   Please read over the following fact sheets that you were given: Pain Booklet, Coughing and Deep Breathing, Blood Transfusion Information, Open Heart Packet, MRSA Information and Surgical Site Infection Prevention

## 2010-11-23 ENCOUNTER — Encounter: Payer: Self-pay | Admitting: *Deleted

## 2010-11-23 MED ORDER — SODIUM CHLORIDE 0.9 % IV SOLN
INTRAVENOUS | Status: DC
  Filled 2010-11-23: qty 40

## 2010-11-23 MED ORDER — NITROGLYCERIN IN D5W 200-5 MCG/ML-% IV SOLN
2.0000 ug/min | INTRAVENOUS | Status: DC
  Filled 2010-11-23: qty 250

## 2010-11-23 MED ORDER — VANCOMYCIN HCL 1000 MG IV SOLR
1500.0000 mg | INTRAVENOUS | Status: DC
  Filled 2010-11-23: qty 1500

## 2010-11-23 MED ORDER — CEFUROXIME SODIUM 750 MG IJ SOLR
750.0000 mg | INTRAMUSCULAR | Status: DC
  Filled 2010-11-23: qty 750

## 2010-11-23 MED ORDER — PHENYLEPHRINE HCL 10 MG/ML IJ SOLN
30.0000 ug/min | INTRAVENOUS | Status: DC
  Filled 2010-11-23: qty 2

## 2010-11-23 MED ORDER — METOPROLOL TARTRATE 12.5 MG HALF TABLET: 12.5000 mg | ORAL_TABLET | Freq: Once | ORAL | Status: DC

## 2010-11-23 MED ORDER — SODIUM CHLORIDE 0.9 % IV SOLN
INTRAVENOUS | Status: DC
  Filled 2010-11-23: qty 1

## 2010-11-23 MED ORDER — SODIUM CHLORIDE 0.9 % IV SOLN
0.1000 ug/kg/h | INTRAVENOUS | Status: DC
  Filled 2010-11-23: qty 4

## 2010-11-23 MED ORDER — DEXTROSE 5 % IV SOLN
1.5000 g | INTRAVENOUS | Status: DC
  Filled 2010-11-23: qty 1.5

## 2010-11-23 MED ORDER — PLASMA-LYTE 148 IV SOLN
INTRAVENOUS | Status: AC
  Administered 2010-11-24: 09:00:00
  Filled 2010-11-23: qty 0.5

## 2010-11-23 MED ORDER — EPINEPHRINE HCL 1 MG/ML IJ SOLN
0.5000 ug/min | INTRAVENOUS | Status: DC
  Filled 2010-11-23: qty 4

## 2010-11-23 MED ORDER — DOPAMINE-DEXTROSE 3.2-5 MG/ML-% IV SOLN
2.0000 ug/kg/min | INTRAVENOUS | Status: DC
  Filled 2010-11-23: qty 250

## 2010-11-23 MED ORDER — POTASSIUM CHLORIDE 2 MEQ/ML IV SOLN
80.0000 meq | INTRAVENOUS | Status: DC
  Filled 2010-11-23: qty 40

## 2010-11-23 MED ORDER — MAGNESIUM SULFATE 50 % IJ SOLN
40.0000 meq | INTRAMUSCULAR | Status: DC
  Filled 2010-11-23: qty 10

## 2010-11-23 NOTE — Consult Note (Signed)
Anesthesia:  Patient is a 65 year old female for CABG.  Hx + for CAD/MI, chronic diastolic HF, hyperlipidemia, HTN, depression with prior suicide attempt, OV, CVA, s/p CEA, DM, TMJ, and former smoker.  I was asked to review her pre-operative labs.  H/H 11.4/33.1, T&S already done.  Glucose was also elevated at 256 with A1C of 8.1.  There was also > 1000 glucose in her urine.  Results are in Epic for Dr. Prescott Gum to review, however, I did fax a reminder to view results in Epic if not done so already as her diabetes control in sub-optimal.  Her recent cath 10/27/10 (under Notes) and echo 11/22/10 (under Imaging) can be found in [place].  EKG and CXR also reviewed.

## 2010-11-24 ENCOUNTER — Other Ambulatory Visit: Payer: Self-pay

## 2010-11-24 ENCOUNTER — Encounter (HOSPITAL_COMMUNITY): Payer: Self-pay | Admitting: *Deleted

## 2010-11-24 ENCOUNTER — Encounter (HOSPITAL_COMMUNITY): Payer: Self-pay | Admitting: Vascular Surgery

## 2010-11-24 ENCOUNTER — Inpatient Hospital Stay (HOSPITAL_COMMUNITY)
Admission: RE | Admit: 2010-11-24 | Discharge: 2010-12-03 | DRG: 236 | Disposition: A | Discharge status: Home Health | Payer: Medicare Other | Source: Ambulatory Visit | Attending: Cardiothoracic Surgery | Admitting: Cardiothoracic Surgery

## 2010-11-24 ENCOUNTER — Inpatient Hospital Stay (HOSPITAL_COMMUNITY): Payer: Medicare Other

## 2010-11-24 ENCOUNTER — Inpatient Hospital Stay (HOSPITAL_COMMUNITY): Payer: Medicare Other | Admitting: Vascular Surgery

## 2010-11-24 ENCOUNTER — Encounter (HOSPITAL_COMMUNITY)
Admission: RE | Disposition: A | Discharge status: Home Health | Payer: Self-pay | Source: Ambulatory Visit | Attending: Cardiothoracic Surgery

## 2010-11-24 DIAGNOSIS — I739 Peripheral vascular disease, unspecified: Secondary | ICD-10-CM | POA: Diagnosis present

## 2010-11-24 DIAGNOSIS — Z794 Long term (current) use of insulin: Secondary | ICD-10-CM

## 2010-11-24 DIAGNOSIS — K5900 Constipation, unspecified: Secondary | ICD-10-CM | POA: Diagnosis not present

## 2010-11-24 DIAGNOSIS — I2510 Atherosclerotic heart disease of native coronary artery without angina pectoris: Secondary | ICD-10-CM

## 2010-11-24 DIAGNOSIS — Z8659 Personal history of other mental and behavioral disorders: Secondary | ICD-10-CM

## 2010-11-24 DIAGNOSIS — F17200 Nicotine dependence, unspecified, uncomplicated: Secondary | ICD-10-CM | POA: Diagnosis present

## 2010-11-24 DIAGNOSIS — E119 Type 2 diabetes mellitus without complications: Secondary | ICD-10-CM | POA: Diagnosis present

## 2010-11-24 DIAGNOSIS — I5032 Chronic diastolic (congestive) heart failure: Secondary | ICD-10-CM | POA: Diagnosis present

## 2010-11-24 DIAGNOSIS — E785 Hyperlipidemia, unspecified: Secondary | ICD-10-CM | POA: Diagnosis present

## 2010-11-24 DIAGNOSIS — Z8673 Personal history of transient ischemic attack (TIA), and cerebral infarction without residual deficits: Secondary | ICD-10-CM

## 2010-11-24 DIAGNOSIS — I214 Non-ST elevation (NSTEMI) myocardial infarction: Secondary | ICD-10-CM | POA: Diagnosis present

## 2010-11-24 DIAGNOSIS — D62 Acute posthemorrhagic anemia: Secondary | ICD-10-CM | POA: Diagnosis not present

## 2010-11-24 DIAGNOSIS — D696 Thrombocytopenia, unspecified: Secondary | ICD-10-CM | POA: Diagnosis not present

## 2010-11-24 DIAGNOSIS — I509 Heart failure, unspecified: Secondary | ICD-10-CM | POA: Diagnosis present

## 2010-11-24 DIAGNOSIS — Z7902 Long term (current) use of antithrombotics/antiplatelets: Secondary | ICD-10-CM

## 2010-11-24 DIAGNOSIS — E8779 Other fluid overload: Secondary | ICD-10-CM | POA: Diagnosis not present

## 2010-11-24 HISTORY — PX: CORONARY ARTERY BYPASS GRAFT: SHX141

## 2010-11-24 LAB — POCT I-STAT 4, (NA,K, GLUC, HGB,HCT)
Glucose, Bld: 135 mg/dL — ABNORMAL HIGH (ref 70–99)
Glucose, Bld: 106 mg/dL — ABNORMAL HIGH (ref 70–99)
Glucose, Bld: 123 mg/dL — ABNORMAL HIGH (ref 70–99)
Glucose, Bld: 121 mg/dL — ABNORMAL HIGH (ref 70–99)
Glucose, Bld: 127 mg/dL — ABNORMAL HIGH (ref 70–99)
Glucose, Bld: 142 mg/dL — ABNORMAL HIGH (ref 70–99)
Glucose, Bld: 116 mg/dL — ABNORMAL HIGH (ref 70–99)
HCT: 31 % — ABNORMAL LOW (ref 36.0–46.0)
HCT: 23 % — ABNORMAL LOW (ref 36.0–46.0)
HCT: 26 % — ABNORMAL LOW (ref 36.0–46.0)
HCT: 21 % — ABNORMAL LOW (ref 36.0–46.0)
HCT: 22 % — ABNORMAL LOW (ref 36.0–46.0)
HCT: 25 % — ABNORMAL LOW (ref 36.0–46.0)
HCT: 30 % — ABNORMAL LOW (ref 36.0–46.0)
Hemoglobin: 10.5 g/dL — ABNORMAL LOW (ref 12.0–15.0)
Hemoglobin: 7.8 g/dL — ABNORMAL LOW (ref 12.0–15.0)
Hemoglobin: 8.8 g/dL — ABNORMAL LOW (ref 12.0–15.0)
Hemoglobin: 7.1 g/dL — ABNORMAL LOW (ref 12.0–15.0)
Hemoglobin: 7.5 g/dL — ABNORMAL LOW (ref 12.0–15.0)
Hemoglobin: 8.5 g/dL — ABNORMAL LOW (ref 12.0–15.0)
Hemoglobin: 10.2 g/dL — ABNORMAL LOW (ref 12.0–15.0)
Potassium: 4.2 mEq/L (ref 3.5–5.1)
Potassium: 4 mEq/L (ref 3.5–5.1)
Potassium: 4.1 mEq/L (ref 3.5–5.1)
Potassium: 4.6 mEq/L (ref 3.5–5.1)
Potassium: 5 mEq/L (ref 3.5–5.1)
Potassium: 4.3 mEq/L (ref 3.5–5.1)
Potassium: 3.9 mEq/L (ref 3.5–5.1)
Sodium: 140 mEq/L (ref 135–145)
Sodium: 140 mEq/L (ref 135–145)
Sodium: 140 mEq/L (ref 135–145)
Sodium: 136 mEq/L (ref 135–145)
Sodium: 136 mEq/L (ref 135–145)
Sodium: 138 mEq/L (ref 135–145)
Sodium: 139 mEq/L (ref 135–145)

## 2010-11-24 LAB — CBC
HCT: 27.2 % — ABNORMAL LOW (ref 36.0–46.0)
HCT: 26.8 % — ABNORMAL LOW (ref 36.0–46.0)
Hemoglobin: 8.9 g/dL — ABNORMAL LOW (ref 12.0–15.0)
MCH: 27.4 pg (ref 26.0–34.0)
MCHC: 33.8 g/dL (ref 30.0–36.0)
MCHC: 33.2 g/dL (ref 30.0–36.0)
MCV: 82.5 fL (ref 78.0–100.0)
Platelets: 148 10*3/uL — ABNORMAL LOW (ref 150–400)
RBC: 3.25 MIL/uL — ABNORMAL LOW (ref 3.87–5.11)
RDW: 14.9 % (ref 11.5–15.5)
RDW: 15.1 % (ref 11.5–15.5)
WBC: 5.5 10*3/uL (ref 4.0–10.5)
WBC: 7.4 10*3/uL (ref 4.0–10.5)

## 2010-11-24 LAB — PROTIME-INR
INR: 1.44 (ref 0.00–1.49)
Prothrombin Time: 17.8 seconds — ABNORMAL HIGH (ref 11.6–15.2)

## 2010-11-24 LAB — POCT I-STAT 3, ART BLOOD GAS (G3+)
Acid-Base Excess: 3 mmol/L — ABNORMAL HIGH (ref 0.0–2.0)
Acid-base deficit: 1 mmol/L (ref 0.0–2.0)
Acid-base deficit: 1 mmol/L (ref 0.0–2.0)
Acid-base deficit: 1 mmol/L (ref 0.0–2.0)
Acid-base deficit: 1 mmol/L (ref 0.0–2.0)
Bicarbonate: 28 mEq/L — ABNORMAL HIGH (ref 20.0–24.0)
Bicarbonate: 24.2 mEq/L — ABNORMAL HIGH (ref 20.0–24.0)
Bicarbonate: 25.3 mEq/L — ABNORMAL HIGH (ref 20.0–24.0)
Bicarbonate: 25.9 mEq/L — ABNORMAL HIGH (ref 20.0–24.0)
Bicarbonate: 25 mEq/L — ABNORMAL HIGH (ref 20.0–24.0)
Bicarbonate: 25.5 mEq/L — ABNORMAL HIGH (ref 20.0–24.0)
O2 Saturation: 100 %
O2 Saturation: 100 %
O2 Saturation: 97 %
O2 Saturation: 98 %
O2 Saturation: 97 %
O2 Saturation: 98 %
Patient temperature: 35.6
Patient temperature: 36.7
Patient temperature: 36.6
Patient temperature: 36.7
TCO2: 29 mmol/L (ref 0–100)
TCO2: 25 mmol/L (ref 0–100)
TCO2: 27 mmol/L (ref 0–100)
TCO2: 27 mmol/L (ref 0–100)
TCO2: 26 mmol/L (ref 0–100)
TCO2: 27 mmol/L (ref 0–100)
pCO2 arterial: 45.8 mmHg — ABNORMAL HIGH (ref 35.0–45.0)
pCO2 arterial: 39.9 mmHg (ref 35.0–45.0)
pCO2 arterial: 44 mmHg (ref 35.0–45.0)
pCO2 arterial: 48 mmHg — ABNORMAL HIGH (ref 35.0–45.0)
pCO2 arterial: 47.6 mmHg — ABNORMAL HIGH (ref 35.0–45.0)
pCO2 arterial: 47.1 mmHg — ABNORMAL HIGH (ref 35.0–45.0)
pH, Arterial: 7.395 (ref 7.350–7.400)
pH, Arterial: 7.391 (ref 7.350–7.400)
pH, Arterial: 7.361 (ref 7.350–7.400)
pH, Arterial: 7.338 — ABNORMAL LOW (ref 7.350–7.400)
pH, Arterial: 7.326 — ABNORMAL LOW (ref 7.350–7.400)
pH, Arterial: 7.341 — ABNORMAL LOW (ref 7.350–7.400)
pO2, Arterial: 360 mmHg — ABNORMAL HIGH (ref 80.0–100.0)
pO2, Arterial: 266 mmHg — ABNORMAL HIGH (ref 80.0–100.0)
pO2, Arterial: 93 mmHg (ref 80.0–100.0)
pO2, Arterial: 113 mmHg — ABNORMAL HIGH (ref 80.0–100.0)
pO2, Arterial: 95 mmHg (ref 80.0–100.0)
pO2, Arterial: 103 mmHg — ABNORMAL HIGH (ref 80.0–100.0)

## 2010-11-24 LAB — APTT: aPTT: 33 seconds (ref 24–37)

## 2010-11-24 LAB — GLUCOSE, CAPILLARY
Glucose-Capillary: 135 mg/dL — ABNORMAL HIGH (ref 70–99)
Glucose-Capillary: 110 mg/dL — ABNORMAL HIGH (ref 70–99)
Glucose-Capillary: 109 mg/dL — ABNORMAL HIGH (ref 70–99)
Glucose-Capillary: 102 mg/dL — ABNORMAL HIGH (ref 70–99)

## 2010-11-24 LAB — POCT I-STAT, CHEM 8
BUN: 20 mg/dL (ref 6–23)
Calcium, Ion: 1.14 mmol/L (ref 1.12–1.32)
Chloride: 107 mEq/L (ref 96–112)
Creatinine, Ser: 0.9 mg/dL (ref 0.50–1.10)
Glucose, Bld: 117 mg/dL — ABNORMAL HIGH (ref 70–99)
HCT: 25 % — ABNORMAL LOW (ref 36.0–46.0)
Hemoglobin: 8.5 g/dL — ABNORMAL LOW (ref 12.0–15.0)
Potassium: 4.4 mEq/L (ref 3.5–5.1)
Sodium: 141 mEq/L (ref 135–145)
TCO2: 24 mmol/L (ref 0–100)

## 2010-11-24 LAB — CREATININE, SERUM
Creatinine, Ser: 0.93 mg/dL (ref 0.50–1.10)
GFR calc Af Amer: 74 mL/min — ABNORMAL LOW (ref >90)
GFR calc non Af Amer: 64 mL/min — ABNORMAL LOW (ref >90)

## 2010-11-24 LAB — HEMOGLOBIN AND HEMATOCRIT, BLOOD
HCT: 20.9 % — ABNORMAL LOW (ref 36.0–46.0)
Hemoglobin: 7.1 g/dL — ABNORMAL LOW (ref 12.0–15.0)

## 2010-11-24 LAB — MAGNESIUM: Magnesium: 2.6 mg/dL — ABNORMAL HIGH (ref 1.5–2.5)

## 2010-11-24 LAB — PLATELET COUNT: Platelets: 106 10*3/uL — ABNORMAL LOW (ref 150–400)

## 2010-11-24 SURGERY — CORONARY ARTERY BYPASS GRAFTING (CABG)
Anesthesia: General | Site: Chest | Wound class: Clean

## 2010-11-24 MED ORDER — ACETAMINOPHEN 160 MG/5ML PO SOLN
975.0000 mg | Freq: Four times a day (QID) | ORAL | Status: AC
  Filled 2010-11-24: qty 40.6

## 2010-11-24 MED ORDER — ROSUVASTATIN CALCIUM 40 MG PO TABS
40.0000 mg | ORAL_TABLET | Freq: Every day | ORAL | Status: DC
  Administered 2010-11-25 – 2010-12-03 (×9): 40 mg via ORAL
  Filled 2010-11-24 (×10): qty 1

## 2010-11-24 MED ORDER — SODIUM CHLORIDE 0.9 % IV SOLN
INTRAVENOUS | Status: DC
  Administered 2010-11-25: 12:00:00 via INTRAVENOUS
  Filled 2010-11-24: qty 1

## 2010-11-24 MED ORDER — NITROGLYCERIN IN D5W 200-5 MCG/ML-% IV SOLN
INTRAVENOUS | Status: DC | PRN
  Administered 2010-11-24: 16.6 ug/min via INTRAVENOUS

## 2010-11-24 MED ORDER — PANTOPRAZOLE SODIUM 40 MG PO TBEC
40.0000 mg | DELAYED_RELEASE_TABLET | Freq: Every day | ORAL | Status: DC
  Administered 2010-11-26 – 2010-11-28 (×3): 40 mg via ORAL
  Filled 2010-11-24 (×3): qty 1

## 2010-11-24 MED ORDER — VECURONIUM BROMIDE 10 MG IV SOLR
INTRAVENOUS | Status: DC | PRN
  Administered 2010-11-24: 2 mg via INTRAVENOUS
  Administered 2010-11-24: 3 mg via INTRAVENOUS
  Administered 2010-11-24: 5 mg via INTRAVENOUS

## 2010-11-24 MED ORDER — SODIUM CHLORIDE 0.9 % IV SOLN
0.1000 ug/kg/h | INTRAVENOUS | Status: DC
  Filled 2010-11-24: qty 2

## 2010-11-24 MED ORDER — PROTAMINE SULFATE 10 MG/ML IV SOLN
INTRAVENOUS | Status: DC | PRN
  Administered 2010-11-24 (×2): 280 mg via INTRAVENOUS

## 2010-11-24 MED ORDER — MIDAZOLAM HCL 5 MG/5ML IJ SOLN
INTRAMUSCULAR | Status: DC | PRN
  Administered 2010-11-24: 3 mg via INTRAVENOUS
  Administered 2010-11-24: 2 mg via INTRAVENOUS
  Administered 2010-11-24 (×2): 3 mg via INTRAVENOUS
  Administered 2010-11-24 (×2): 2 mg via INTRAVENOUS

## 2010-11-24 MED ORDER — SODIUM CHLORIDE 0.9 % IV SOLN
INTRAVENOUS | Status: DC | PRN
  Filled 2010-11-24: qty 1

## 2010-11-24 MED ORDER — INSULIN ASPART 100 UNIT/ML ~~LOC~~ SOLN
0.0000 [IU] | SUBCUTANEOUS | Status: DC
  Administered 2010-11-26: 4 [IU] via SUBCUTANEOUS
  Administered 2010-11-26: 2 [IU] via SUBCUTANEOUS
  Administered 2010-11-26: 12 [IU] via SUBCUTANEOUS
  Administered 2010-11-26: 16 [IU] via SUBCUTANEOUS
  Administered 2010-11-27: 2 [IU] via SUBCUTANEOUS
  Administered 2010-11-27: 12 [IU] via SUBCUTANEOUS
  Administered 2010-11-27: 2 [IU] via SUBCUTANEOUS
  Administered 2010-11-27: 4 [IU] via SUBCUTANEOUS
  Administered 2010-11-27: 2 [IU] via SUBCUTANEOUS
  Administered 2010-11-28: 8 [IU] via SUBCUTANEOUS
  Administered 2010-11-28: 12 [IU] via SUBCUTANEOUS
  Filled 2010-11-24: qty 3

## 2010-11-24 MED ORDER — MAGNESIUM SULFATE 50 % IJ SOLN
4.0000 g | Freq: Once | INTRAVENOUS | Status: DC
  Filled 2010-11-24: qty 8

## 2010-11-24 MED ORDER — HEMOSTATIC AGENTS (NO CHARGE) OPTIME
TOPICAL | Status: DC | PRN
  Administered 2010-11-24: 1 via TOPICAL

## 2010-11-24 MED ORDER — BISACODYL 5 MG PO TBEC
10.0000 mg | DELAYED_RELEASE_TABLET | Freq: Every day | ORAL | Status: DC
  Administered 2010-11-25 – 2010-11-28 (×4): 10 mg via ORAL
  Filled 2010-11-24 (×4): qty 2

## 2010-11-24 MED ORDER — LORATADINE 10 MG PO TABS
10.0000 mg | ORAL_TABLET | Freq: Every day | ORAL | Status: DC
  Administered 2010-11-25 – 2010-12-03 (×9): 10 mg via ORAL
  Filled 2010-11-24 (×10): qty 1

## 2010-11-24 MED ORDER — ACETAMINOPHEN 650 MG RE SUPP
650.0000 mg | RECTAL | Status: AC
  Administered 2010-11-24: 650 mg via RECTAL

## 2010-11-24 MED ORDER — BISACODYL 10 MG RE SUPP: 10.0000 mg | Freq: Every day | RECTAL | Status: DC

## 2010-11-24 MED ORDER — MORPHINE SULFATE 2 MG/ML IJ SOLN
1.0000 mg | INTRAMUSCULAR | Status: AC | PRN
  Filled 2010-11-24: qty 1

## 2010-11-24 MED ORDER — LACTATED RINGERS IV SOLN: 500.0000 mL | Freq: Once | INTRAVENOUS | Status: AC | PRN

## 2010-11-24 MED ORDER — VANCOMYCIN HCL 1000 MG IV SOLR
1000.0000 mg | Freq: Once | INTRAVENOUS | Status: AC
  Administered 2010-11-24: 1000 mg via INTRAVENOUS
  Filled 2010-11-24: qty 1000

## 2010-11-24 MED ORDER — ROCURONIUM BROMIDE 100 MG/10ML IV SOLN
INTRAVENOUS | Status: DC | PRN
  Administered 2010-11-24: 30 mg via INTRAVENOUS
  Administered 2010-11-24: 50 mg via INTRAVENOUS

## 2010-11-24 MED ORDER — SODIUM CHLORIDE 0.9 % IV SOLN
100.0000 [IU] | INTRAVENOUS | Status: DC | PRN
  Administered 2010-11-24: 2.3 [IU]/h via INTRAVENOUS

## 2010-11-24 MED ORDER — MORPHINE SULFATE 4 MG/ML IJ SOLN
2.0000 mg | INTRAMUSCULAR | Status: DC | PRN
  Administered 2010-11-24 (×3): 4 mg via INTRAVENOUS
  Administered 2010-11-25 (×4): 2 mg via INTRAVENOUS
  Filled 2010-11-24 (×5): qty 1

## 2010-11-24 MED ORDER — DOBUTAMINE IN D5W 4-5 MG/ML-% IV SOLN
2.0000 ug/kg/min | INTRAVENOUS | Status: DC
  Filled 2010-11-24: qty 250

## 2010-11-24 MED ORDER — FENTANYL CITRATE 0.05 MG/ML IJ SOLN
INTRAMUSCULAR | Status: DC | PRN
  Administered 2010-11-24: 250 ug via INTRAVENOUS
  Administered 2010-11-24: 100 ug via INTRAVENOUS
  Administered 2010-11-24 (×2): 250 ug via INTRAVENOUS
  Administered 2010-11-24: 350 ug via INTRAVENOUS
  Administered 2010-11-24: 300 ug via INTRAVENOUS

## 2010-11-24 MED ORDER — LACTATED RINGERS IV SOLN
INTRAVENOUS | Status: DC | PRN
  Administered 2010-11-24: 07:00:00 via INTRAVENOUS

## 2010-11-24 MED ORDER — SODIUM CHLORIDE 0.9 % IJ SOLN
3.0000 mL | Freq: Two times a day (BID) | INTRAMUSCULAR | Status: DC
  Administered 2010-11-25 – 2010-11-28 (×7): 3 mL via INTRAVENOUS

## 2010-11-24 MED ORDER — HEPARIN SODIUM (PORCINE) 1000 UNIT/ML IJ SOLN
INTRAMUSCULAR | Status: DC | PRN
  Administered 2010-11-24: 3000 [IU] via INTRAVENOUS
  Administered 2010-11-24: 25000 [IU] via INTRAVENOUS

## 2010-11-24 MED ORDER — SODIUM CHLORIDE 0.9 % IV SOLN
INTRAVENOUS | Status: DC
  Administered 2010-11-27: 07:00:00 via INTRAVENOUS

## 2010-11-24 MED ORDER — MAGNESIUM SULFATE 40 MG/ML IJ SOLN
INTRAMUSCULAR | Status: AC
Start: 2010-11-24 — End: 2010-11-24
  Administered 2010-11-24: 4 g
  Filled 2010-11-24: qty 100

## 2010-11-24 MED ORDER — METOPROLOL TARTRATE 25 MG/10 ML ORAL SUSPENSION
12.5000 mg | Freq: Two times a day (BID) | ORAL | Status: DC
  Filled 2010-11-24 (×11): qty 5

## 2010-11-24 MED ORDER — DOBUTAMINE IN D5W 4-5 MG/ML-% IV SOLN
2.5000 ug/kg/min | INTRAVENOUS | Status: DC
  Filled 2010-11-24: qty 250

## 2010-11-24 MED ORDER — SODIUM CHLORIDE 0.9 % IV SOLN
200.0000 ug | INTRAVENOUS | Status: DC | PRN
  Administered 2010-11-24: .2 ug/kg/h via INTRAVENOUS

## 2010-11-24 MED ORDER — ALBUMIN HUMAN 5 % IV SOLN
250.0000 mL | INTRAVENOUS | Status: DC | PRN
  Administered 2010-11-24 (×3): 250 mL via INTRAVENOUS
  Filled 2010-11-24: qty 250

## 2010-11-24 MED ORDER — FUROSEMIDE 10 MG/ML IJ SOLN
20.0000 mg | Freq: Once | INTRAMUSCULAR | Status: AC
  Administered 2010-11-24: 20 mg via INTRAVENOUS

## 2010-11-24 MED ORDER — METOPROLOL TARTRATE 12.5 MG HALF TABLET
12.5000 mg | ORAL_TABLET | Freq: Two times a day (BID) | ORAL | Status: DC
  Administered 2010-11-25 – 2010-11-28 (×3): 12.5 mg via ORAL
  Filled 2010-11-24 (×11): qty 1

## 2010-11-24 MED ORDER — OXYCODONE HCL 5 MG PO TABS
5.0000 mg | ORAL_TABLET | ORAL | Status: DC | PRN
  Administered 2010-11-25 (×2): 5 mg via ORAL
  Administered 2010-11-25 (×2): 10 mg via ORAL
  Administered 2010-11-25: 5 mg via ORAL
  Administered 2010-11-25: 10 mg via ORAL
  Administered 2010-11-25: 5 mg via ORAL
  Administered 2010-11-26 – 2010-11-27 (×3): 10 mg via ORAL
  Filled 2010-11-24: qty 2
  Filled 2010-11-24: qty 1
  Filled 2010-11-24: qty 2
  Filled 2010-11-24: qty 1
  Filled 2010-11-24 (×2): qty 2
  Filled 2010-11-24 (×3): qty 1
  Filled 2010-11-24: qty 2

## 2010-11-24 MED ORDER — VARENICLINE TARTRATE 1 MG PO TABS
1.0000 mg | ORAL_TABLET | Freq: Every day | ORAL | Status: DC
  Administered 2010-11-25 – 2010-12-03 (×9): 1 mg via ORAL
  Filled 2010-11-24 (×9): qty 1

## 2010-11-24 MED ORDER — ONDANSETRON HCL 4 MG/2ML IJ SOLN
4.0000 mg | Freq: Four times a day (QID) | INTRAMUSCULAR | Status: DC | PRN
  Administered 2010-11-24 – 2010-11-28 (×4): 4 mg via INTRAVENOUS
  Filled 2010-11-24 (×4): qty 2

## 2010-11-24 MED ORDER — PROPOFOL 10 MG/ML IV EMUL
INTRAVENOUS | Status: DC | PRN
  Administered 2010-11-24: 110 mg via INTRAVENOUS

## 2010-11-24 MED ORDER — DEXTROSE 5 % IV SOLN
1.5000 g | Freq: Two times a day (BID) | INTRAVENOUS | Status: AC
  Administered 2010-11-24 – 2010-11-26 (×4): 1.5 g via INTRAVENOUS
  Filled 2010-11-24 (×5): qty 1.5

## 2010-11-24 MED ORDER — SODIUM CHLORIDE 0.9 % IV SOLN: 250.0000 mL | INTRAVENOUS | Status: DC

## 2010-11-24 MED ORDER — FUROSEMIDE 10 MG/ML IJ SOLN
INTRAMUSCULAR | Status: AC
  Administered 2010-11-24: 20 mg via INTRAVENOUS
  Filled 2010-11-24: qty 4

## 2010-11-24 MED ORDER — DEXTROSE 5 % IV SOLN
0.7500 g | INTRAVENOUS | Status: DC | PRN
  Administered 2010-11-24: 1.5 g via INTRAVENOUS
  Administered 2010-11-24: .75 g via INTRAVENOUS

## 2010-11-24 MED ORDER — ACETAMINOPHEN 160 MG/5ML PO SOLN: 975.0000 mg | Freq: Four times a day (QID) | ORAL | Status: DC

## 2010-11-24 MED ORDER — VANCOMYCIN HCL 1000 MG IV SOLR
1000.0000 mg | INTRAVENOUS | Status: DC | PRN
  Administered 2010-11-24: 1.5 g via INTRAVENOUS

## 2010-11-24 MED ORDER — PHENYLEPHRINE HCL 10 MG/ML IJ SOLN
0.0000 ug/min | INTRAMUSCULAR | Status: DC
  Filled 2010-11-24: qty 2

## 2010-11-24 MED ORDER — SODIUM CHLORIDE 0.9 % IR SOLN
Status: DC | PRN
  Administered 2010-11-24: 6000 mL
  Administered 2010-11-24: 1000 mL

## 2010-11-24 MED ORDER — DOCUSATE SODIUM 100 MG PO CAPS
200.0000 mg | ORAL_CAPSULE | Freq: Every day | ORAL | Status: DC
  Administered 2010-11-25 – 2010-11-28 (×4): 200 mg via ORAL
  Filled 2010-11-24 (×4): qty 2

## 2010-11-24 MED ORDER — MIDAZOLAM HCL 2 MG/2ML IJ SOLN: 2.0000 mg | INTRAMUSCULAR | Status: DC | PRN

## 2010-11-24 MED ORDER — SODIUM CHLORIDE 0.45 % IV SOLN
INTRAVENOUS | Status: DC
  Administered 2010-11-24 – 2010-11-26 (×2): via INTRAVENOUS

## 2010-11-24 MED ORDER — ASPIRIN EC 325 MG PO TBEC
325.0000 mg | DELAYED_RELEASE_TABLET | Freq: Every day | ORAL | Status: DC
  Administered 2010-11-25 – 2010-11-28 (×4): 325 mg via ORAL
  Filled 2010-11-24 (×4): qty 1

## 2010-11-24 MED ORDER — ACETAMINOPHEN 500 MG PO TABS
1000.0000 mg | ORAL_TABLET | Freq: Four times a day (QID) | ORAL | Status: AC
  Administered 2010-11-25 – 2010-11-28 (×12): 1000 mg via ORAL
  Filled 2010-11-24 (×15): qty 2

## 2010-11-24 MED ORDER — PHENYLEPHRINE HCL 10 MG/ML IJ SOLN
10.0000 mg | INTRAVENOUS | Status: DC | PRN
  Administered 2010-11-24: 25 ug/min via INTRAVENOUS

## 2010-11-24 MED ORDER — SODIUM CHLORIDE 0.9 % IJ SOLN
3.0000 mL | INTRAMUSCULAR | Status: DC | PRN
  Administered 2010-11-26 – 2010-11-27 (×3): 3 mL via INTRAVENOUS

## 2010-11-24 MED ORDER — FAMOTIDINE IN NACL 20-0.9 MG/50ML-% IV SOLN
20.0000 mg | Freq: Two times a day (BID) | INTRAVENOUS | Status: AC
  Administered 2010-11-24: 20 mg via INTRAVENOUS

## 2010-11-24 MED ORDER — METOPROLOL TARTRATE 1 MG/ML IV SOLN: 2.5000 mg | INTRAVENOUS | Status: DC | PRN

## 2010-11-24 MED ORDER — ACETAMINOPHEN 160 MG/5ML PO SOLN: 650.0000 mg | ORAL | Status: AC

## 2010-11-24 MED ORDER — PANTOPRAZOLE SODIUM 40 MG PO TBEC
40.0000 mg | DELAYED_RELEASE_TABLET | Freq: Every day | ORAL | Status: DC
  Administered 2010-11-25: 40 mg via ORAL
  Filled 2010-11-24: qty 1

## 2010-11-24 MED ORDER — MORPHINE SULFATE 4 MG/ML IJ SOLN
INTRAMUSCULAR | Status: AC
  Administered 2010-11-24: 4 mg via INTRAVENOUS
  Filled 2010-11-24: qty 1

## 2010-11-24 MED ORDER — ACETAMINOPHEN 500 MG PO TABS: 1000.0000 mg | ORAL_TABLET | Freq: Four times a day (QID) | ORAL | Status: DC

## 2010-11-24 MED ORDER — EZETIMIBE-SIMVASTATIN 10-40 MG PO TABS
1.0000 | ORAL_TABLET | Freq: Every day | ORAL | Status: DC
  Administered 2010-11-25 – 2010-11-27 (×3): 1 via ORAL
  Filled 2010-11-24 (×6): qty 1

## 2010-11-24 MED ORDER — LACTATED RINGERS IV SOLN
INTRAVENOUS | Status: DC | PRN
  Administered 2010-11-24 (×2): via INTRAVENOUS

## 2010-11-24 MED ORDER — ASPIRIN 81 MG PO CHEW: 324.0000 mg | CHEWABLE_TABLET | Freq: Every day | ORAL | Status: DC

## 2010-11-24 MED ORDER — ESCITALOPRAM OXALATE 10 MG PO TABS
10.0000 mg | ORAL_TABLET | Freq: Every day | ORAL | Status: DC
  Administered 2010-11-25 – 2010-12-03 (×9): 10 mg via ORAL
  Filled 2010-11-24 (×9): qty 1

## 2010-11-24 MED ORDER — SODIUM CHLORIDE 0.9 % IJ SOLN
OROMUCOSAL | Status: DC | PRN
  Administered 2010-11-24 (×3): via TOPICAL

## 2010-11-24 MED ORDER — PAPAVERINE HCL 30 MG/ML IJ SOLN
INTRAMUSCULAR | Status: DC | PRN
  Administered 2010-11-24: 60 mg via INTRAVENOUS

## 2010-11-24 MED ORDER — DOBUTAMINE IN D5W 4-5 MG/ML-% IV SOLN
INTRAVENOUS | Status: DC | PRN
  Administered 2010-11-24: 3 ug/kg/min via INTRAVENOUS

## 2010-11-24 MED ORDER — NITROGLYCERIN IN D5W 200-5 MCG/ML-% IV SOLN: 0.0000 ug/min | INTRAVENOUS | Status: DC

## 2010-11-24 MED ORDER — LACTATED RINGERS IV SOLN
INTRAVENOUS | Status: DC
  Administered 2010-11-25: 20 mL via INTRAVENOUS

## 2010-11-24 MED ORDER — LACTATED RINGERS IV SOLN
INTRAVENOUS | Status: DC | PRN
  Administered 2010-11-24 (×2): via INTRAVENOUS

## 2010-11-24 MED ORDER — SODIUM CHLORIDE 0.9 % IV SOLN
10.0000 g | INTRAVENOUS | Status: DC | PRN
  Administered 2010-11-24: 5 g/h via INTRAVENOUS

## 2010-11-24 MED ORDER — POTASSIUM CHLORIDE 10 MEQ/50ML IV SOLN
10.0000 meq | INTRAVENOUS | Status: AC
  Administered 2010-11-24 (×3): 10 meq via INTRAVENOUS

## 2010-11-24 SURGICAL SUPPLY — 138 items
ADAPTER CARDIO PERF ANTE/RETRO (ADAPTER) ×3 IMPLANT
ADPR PRFSN 84XANTGRD RTRGD (ADAPTER) ×1
APL SKNCLS STERI-STRIP NONHPOA (GAUZE/BANDAGES/DRESSINGS) ×1
APPLIER CLIP 9.375 MED OPEN (MISCELLANEOUS)
APPLIER CLIP 9.375 SM OPEN (CLIP)
APR CLP MED 9.3 20 MLT OPN (MISCELLANEOUS)
APR CLP SM 9.3 20 MLT OPN (CLIP)
ATTRACTOMAT 16X20 MAGNETIC DRP (DRAPES) ×3 IMPLANT
BAG DECANTER FOR FLEXI CONT (MISCELLANEOUS) ×3 IMPLANT
BANDAGE ELASTIC 4 VELCRO ST LF (GAUZE/BANDAGES/DRESSINGS) ×5 IMPLANT
BANDAGE ELASTIC 6 VELCRO ST LF (GAUZE/BANDAGES/DRESSINGS) ×5 IMPLANT
BANDAGE GAUZE ELAST BULKY 4 IN (GAUZE/BANDAGES/DRESSINGS) ×5 IMPLANT
BASKET HEART  (ORDER IN 25'S) (MISCELLANEOUS) ×1
BASKET HEART (ORDER IN 25'S) (MISCELLANEOUS) ×1
BASKET HEART (ORDER IN 25S) (MISCELLANEOUS) ×1 IMPLANT
BENZOIN TINCTURE PRP APPL 2/3 (GAUZE/BANDAGES/DRESSINGS) ×2 IMPLANT
BLADE SAW STERNAL (BLADE) ×3 IMPLANT
BLADE SURG 11 STRL SS (BLADE) ×2 IMPLANT
BLADE SURG 12 STRL SS (BLADE) ×3 IMPLANT
BLADE SURG ROTATE 9660 (MISCELLANEOUS) ×1 IMPLANT
CANISTER SUCTION 2500CC (MISCELLANEOUS) ×3 IMPLANT
CANNULA AORTIC ROOT 20012 (MISCELLANEOUS) ×2 IMPLANT
CANNULA GUNDRY RCSP 15FR (MISCELLANEOUS) ×3 IMPLANT
CANNULA VESSEL W/WING W/VALVE (CANNULA) ×2 IMPLANT
CATH ROBINSON RED A/P 18FR (CATHETERS) ×9 IMPLANT
CATH THORACIC 28FR (CATHETERS) ×1 IMPLANT
CATH THORACIC 28FR RT ANG (CATHETERS) ×1 IMPLANT
CATH THORACIC 36FR (CATHETERS) ×3 IMPLANT
CATH THORACIC 36FR RT ANG (CATHETERS) ×6 IMPLANT
CLIP APPLIE 9.375 MED OPEN (MISCELLANEOUS) IMPLANT
CLIP APPLIE 9.375 SM OPEN (CLIP) IMPLANT
CLIP FOGARTY SPRING 6M (CLIP) ×4 IMPLANT
CLIP TI MEDIUM 24 (CLIP) IMPLANT
CLIP TI WIDE RED SMALL 24 (CLIP) ×2 IMPLANT
CLOSURE WOUND 1/2 X4 (GAUZE/BANDAGES/DRESSINGS) ×1
CLOTH BEACON ORANGE TIMEOUT ST (SAFETY) ×3 IMPLANT
CONN Y 3/8X3/8X3/8  BEN (MISCELLANEOUS)
CONN Y 3/8X3/8X3/8 BEN (MISCELLANEOUS) IMPLANT
COVER SURGICAL LIGHT HANDLE (MISCELLANEOUS) ×6 IMPLANT
CRADLE DONUT ADULT HEAD (MISCELLANEOUS) ×3 IMPLANT
DRAPE CARDIOVASCULAR INCISE (DRAPES) ×3
DRAPE SLUSH MACHINE 52X66 (DRAPES) ×3 IMPLANT
DRAPE SLUSH/WARMER DISC (DRAPES) IMPLANT
DRAPE SRG 135X102X78XABS (DRAPES) ×1 IMPLANT
DRSG COVADERM 4X14 (GAUZE/BANDAGES/DRESSINGS) ×3 IMPLANT
ELECT BLADE 6.5 EXT (BLADE) ×3 IMPLANT
ELECT CAUTERY BLADE 6.4 (BLADE) ×3 IMPLANT
ELECT PAD GROUND ADT 9 (MISCELLANEOUS) IMPLANT
ELECT REM PT RETURN 9FT ADLT (ELECTROSURGICAL) ×6
ELECTRODE REM PT RTRN 9FT ADLT (ELECTROSURGICAL) ×2 IMPLANT
GLOVE BIO SURGEON STRL SZ 6 (GLOVE) ×6 IMPLANT
GLOVE BIO SURGEON STRL SZ 6.5 (GLOVE) ×3 IMPLANT
GLOVE BIO SURGEON STRL SZ7 (GLOVE) ×2 IMPLANT
GLOVE BIO SURGEON STRL SZ7.5 (GLOVE) ×13 IMPLANT
GLOVE BIO SURGEONS STRL SZ 6.5 (GLOVE) ×3
GLOVE BIOGEL PI IND STRL 6 (GLOVE) ×1 IMPLANT
GLOVE BIOGEL PI IND STRL 6.5 (GLOVE) ×1 IMPLANT
GLOVE BIOGEL PI IND STRL 7.0 (GLOVE) ×1 IMPLANT
GLOVE BIOGEL PI INDICATOR 6 (GLOVE) ×6
GLOVE BIOGEL PI INDICATOR 6.5 (GLOVE) ×6
GLOVE BIOGEL PI INDICATOR 7.0 (GLOVE) ×20
GLOVE EUDERMIC 7 POWDERFREE (GLOVE) ×1 IMPLANT
GLOVE ORTHO TXT STRL SZ7.5 (GLOVE) ×1 IMPLANT
GOWN STRL NON-REIN LRG LVL3 (GOWN DISPOSABLE) ×20 IMPLANT
HEMOSTAT POWDER SURGIFOAM 1G (HEMOSTASIS) ×9 IMPLANT
HEMOSTAT SURGICEL 2X14 (HEMOSTASIS) ×3 IMPLANT
INSERT FOGARTY 61MM (MISCELLANEOUS) ×3 IMPLANT
INSERT FOGARTY XLG (MISCELLANEOUS) IMPLANT
KIT BASIN OR (CUSTOM PROCEDURE TRAY) ×3 IMPLANT
KIT PAIN CUSTOM (MISCELLANEOUS) IMPLANT
KIT ROOM TURNOVER OR (KITS) ×3 IMPLANT
KIT SUCTION CATH 14FR (SUCTIONS) ×5 IMPLANT
KIT VASOVIEW W/TROCAR VH 2000 (KITS) ×3 IMPLANT
LINE VENT (MISCELLANEOUS) ×2 IMPLANT
MARKER GRAFT CORONARY BYPASS (MISCELLANEOUS) ×9 IMPLANT
NEEDLE 27GAX1X1/2 (NEEDLE) ×2 IMPLANT
NS IRRIG 1000ML POUR BTL (IV SOLUTION) ×19 IMPLANT
PACK OPEN HEART (CUSTOM PROCEDURE TRAY) ×3 IMPLANT
PAD ARMBOARD 7.5X6 YLW CONV (MISCELLANEOUS) ×3 IMPLANT
PENCIL BUTTON HOLSTER BLD 10FT (ELECTRODE) ×3 IMPLANT
PUNCH AORTIC ROTATE 4.5MM 8IN (MISCELLANEOUS) IMPLANT
PUNCH AORTIC ROTATE 5MM 8IN (MISCELLANEOUS) IMPLANT
SET CARDIO DLP MULTI-PER 4-LEG (TRAUMA) IMPLANT
SET CARDIOPLEGIA MPS 5001102 (MISCELLANEOUS) ×2 IMPLANT
SET MULTI PERFUSION 14000 (TRAUMA)
SOLUTION ANTI FOG 6CC (MISCELLANEOUS) ×1 IMPLANT
SPONGE GAUZE 4X4 12PLY (GAUZE/BANDAGES/DRESSINGS) ×6 IMPLANT
SPONGE INTESTINAL PEANUT (DISPOSABLE) ×1 IMPLANT
SPONGE LAP 18X18 X RAY DECT (DISPOSABLE) ×5 IMPLANT
SPONGE LAP 4X18 X RAY DECT (DISPOSABLE) ×3 IMPLANT
STRIP CLOSURE SKIN 1/2X4 (GAUZE/BANDAGES/DRESSINGS) ×1 IMPLANT
SURGIFLO TRUKIT (HEMOSTASIS) ×2 IMPLANT
SUT BONE WAX W31G (SUTURE) ×3 IMPLANT
SUT MNCRL AB 4-0 PS2 18 (SUTURE) ×2 IMPLANT
SUT PROLENE 3 0 SH 1 (SUTURE) IMPLANT
SUT PROLENE 3 0 SH DA (SUTURE) IMPLANT
SUT PROLENE 3 0 SH1 36 (SUTURE) ×3 IMPLANT
SUT PROLENE 4 0 RB 1 (SUTURE) ×3
SUT PROLENE 4 0 SH DA (SUTURE) IMPLANT
SUT PROLENE 4-0 RB1 .5 CRCL 36 (SUTURE) IMPLANT
SUT PROLENE 5 0 C 1 36 (SUTURE) ×3 IMPLANT
SUT PROLENE 6 0 C 1 30 (SUTURE) ×2 IMPLANT
SUT PROLENE 6 0 CC (SUTURE) ×1 IMPLANT
SUT PROLENE 7 0 BV 1 (SUTURE) ×3 IMPLANT
SUT PROLENE 7 0 BV1 MDA (SUTURE) ×7 IMPLANT
SUT PROLENE 7 0 DA (SUTURE) IMPLANT
SUT PROLENE 7.0 RB 3 (SUTURE) ×7 IMPLANT
SUT PROLENE 8 0 BV175 6 (SUTURE) IMPLANT
SUT PROLENE BLUE 7 0 (SUTURE) ×2 IMPLANT
SUT PROLENE POLY MONO (SUTURE) ×3 IMPLANT
SUT SILK  1 MH (SUTURE)
SUT SILK 1 MH (SUTURE) IMPLANT
SUT SILK 1 TIES 10X30 (SUTURE) IMPLANT
SUT SILK 2 0 SH CR/8 (SUTURE) ×2 IMPLANT
SUT SILK 3 0 SH CR/8 (SUTURE) ×1 IMPLANT
SUT STEEL 6MS V (SUTURE) ×4 IMPLANT
SUT STEEL STERNAL CCS#1 18IN (SUTURE) IMPLANT
SUT STEEL SZ 6 DBL 3X14 BALL (SUTURE) ×2 IMPLANT
SUT VIC AB 1 CTX 18 (SUTURE) IMPLANT
SUT VIC AB 1 CTX 36 (SUTURE) ×6
SUT VIC AB 1 CTX36XBRD ANBCTR (SUTURE) IMPLANT
SUT VIC AB 2-0 CT1 27 (SUTURE) ×3
SUT VIC AB 2-0 CT1 TAPERPNT 27 (SUTURE) IMPLANT
SUT VIC AB 2-0 CTX 27 (SUTURE) IMPLANT
SUT VIC AB 3-0 SH 27 (SUTURE)
SUT VIC AB 3-0 SH 27X BRD (SUTURE) IMPLANT
SUT VIC AB 3-0 X1 27 (SUTURE) IMPLANT
SUT VICRYL 4-0 PS2 18IN ABS (SUTURE) IMPLANT
SUTURE E-PAK OPEN HEART (SUTURE) ×3 IMPLANT
SYSTEM SAHARA CHEST DRAIN ATS (WOUND CARE) ×3 IMPLANT
TAPE CLOTH SURG 4X10 WHT LF (GAUZE/BANDAGES/DRESSINGS) ×2 IMPLANT
TAPE PAPER 2X10 WHT MICROPORE (GAUZE/BANDAGES/DRESSINGS) ×2 IMPLANT
TOWEL OR 17X24 6PK STRL BLUE (TOWEL DISPOSABLE) ×5 IMPLANT
TOWEL OR 17X26 10 PK STRL BLUE (TOWEL DISPOSABLE) ×5 IMPLANT
TRAY FOLEY IC TEMP SENS 14FR (CATHETERS) ×2 IMPLANT
TUBING INSUFFLATION 10FT LAP (TUBING) ×3 IMPLANT
UNDERPAD 30X30 INCONTINENT (UNDERPADS AND DIAPERS) ×3 IMPLANT
WATER STERILE IRR 1000ML POUR (IV SOLUTION) ×6 IMPLANT

## 2010-11-24 NOTE — Op Note (Signed)
OPERATIVE NOTE  Procedure coronary artery bypass grafting x4 --left IMA to LAD, saphenous vein graft to posterior lateral, saphenous vein graft to obtuse marginal, saphenous vein graft to diagonal  SURGEON Tharon Aquas trigt M.D. Terrence Dupont Suzzanne Cloud PA-C  Preoperative and postoperative diagnosis--severe three-vessel coronary artery disease in 65 year old female diabetic with recent subendocardial MI  ANESTHESIA Gen. by Dr. Shanon Brow crews  OPERATIVE NOTE  The patient was brought to the operating room for multivessel bypass grafting. She previously been evaluated in the office were her cardiac cath was reviewed with the patient and family and indications benefits and risks of coronary bypass surgery were fully discussed. She understood the risks of stroke, bleeding, MI, infection, and death.  The patient was placed supine on the operating room table and general anesthesia was induced under invasive hemodynamic monitoring. The chest abdomen and legs were prepped with Betadine and draped as a sterile field. A sternal incision was made as the saphenous vein was harvested endoscopically from both thighs. The left IMA was harvested as a pedicle graft from its origin at the subclavian vessels. The sternal retractor was placed in the pericardium was opened and suspended. Procedure placed in the ascending aorta and right atrium and heparin was administered. The ACT was documented as being therapeutic. The vein was inspected and found to be adequate and the patient was placed on cardioplegia bypass. The coronaries were identified for grafting and cardia bleeding catheters were placed. The patient was cooled to 32 and the aorta cross-clamp was applied. 800 cc of cold blood cardioplegia was delivered and there is good cardioplegic arrest.  The distal coronary anastomoses were performed. The first distal anastomosis was to the posterior lateral branch of the right coronary. This is 1.5 mm vessel a proximal 90%  stenosis. A reverse saphenous vein was sewn end-to-side with running 7-0 Prolene with good flow through the graft the second distal anastomosis was to the obtuse marginal branch of the left coronary. This had a diffuse proximal 90% stenosis. Care of her saphenous vein was sewn end-to-side running 7-0 Prolene. The third distal anastomosis was to the diagonal branch the LAD. This had a proximal 90% stenosis. A reverse saphenous vein was sewn end-to-side with running 7-0 Prolene. The fourth distal anastomosis was to the distal LAD. It was 1.5 mV a proximal 80-90% stenosis. The left IMA was brought through an opening in the pericardium and sewn end-to-side to the LAD with running 8-0 Prolene. There is good flow to the graft. Cardiac lead was redosed. The 3 proximal vein anastomoses were then placed on the descending aorta.  Warm retrograde cardioplegia was used to flush any air out of the coronaries and the usual de-airing maneuvers were made before removing the cross-clamp. The heart resumed a spontaneous rhythm. The grafts were hemostatic and had good flow. Y. for applied and the lungs were expanded. Patient was weaned off bypass without difficulty.  Hemostasis was achieved after protamine was administered. The superior pericardial fat was closed over the aorta. A mediastinal and left process were placed. The sternum was closed with a wire. The fascia and subcutaneous layer were closed with a running Vicryl the skin was closed with a subcuticular. Transesophageal echo showed normal global LV function. Total bypass time was 123 minutes.

## 2010-11-24 NOTE — Pre-Procedure Instructions (Signed)
[address]            [place] [address]          [phone]        The patient was examined and preop studies reviewed. There has been no change from the prior exam and the patient is ready for surgery.

## 2010-11-24 NOTE — Procedures (Signed)
Extubation Procedure Note  Patient Details:   Name: [name] [name] DOB: 06-25-1945 MRN: [id number]   Airway Documentation:  AIRWAYS 8 mm (Active)  Secured at (cm) 23 cm 11/24/2010 12:00 AM     Airway 8 mm (Active)  Secured at (cm) 24 cm 11/24/2010  1:28 PM  Measured From Lips 11/24/2010  6:52 PM  Secured By Pink Tape 11/24/2010  6:52 PM  Site Condition Dry 11/24/2010  6:52 PM    Evaluation  O2 sats: stable throughout Complications: No apparent complications Patient did tolerate procedure well. Bilateral Breath Sounds: Diminished;Rhonchi   Yes  NIF- 38 VC 1000  + Leak test Pt placed on 4l/[place] and IS done with good efforts averaging 500 volumes  Clare Gandy, Chales Abrahams 11/24/2010, 10:50 PM

## 2010-11-24 NOTE — Brief Op Note (Signed)
11/24/2010  1:39 PM  PATIENT:  Tenna Child Mizzell  65 y.o. female  PRE-OPERATIVE DIAGNOSIS:  Coronary Artery Disease  POST-OPERATIVE DIAGNOSIS:  Coronary Artery Disease  PROCEDURE:  Procedure(s): CORONARY ARTERY BYPASS GRAFTING (CABG)x4  SURGEON:  Surgeon(s): Tharon Aquas Trigt III, MD  PHYSICIAN ASSISTANT: Stefani Dama     ANESTHESIA:   general  EBL:  Total I/O In: 6979 [I.V.:3900; Blood:799] Out: 2225 [Urine:825; Blood:1400]  BLOOD ADMINISTERED:400 CC PRBC  DRAINS: (1  ) Blake drain(s) in the mediastinum       DISPOSITION OF SPECIMEN:    COUNTS:  YES  DICTATION: .Dragon Dictation  PLAN OF CARE: Admit to inpatient   PATIENT DISPOSITION:  ICU - intubated and hemodynamically stable.   Delay start of Pharmacological VTE agent (>24hrs) due to surgical blood loss or risk of bleeding:yes

## 2010-11-24 NOTE — Transfer of Care (Signed)
Immediate Anesthesia Transfer of Care Note  Patient: [name] [name]  Procedure(s) Performed:  CORONARY ARTERY BYPASS GRAFTING (CABG) - Coronary Artery Bypass Graft times four on pump utilizing left internal mammary artery and bilateral greater saphenous veins harvested endoscopically, transesophageal echocardiogram   Patient Location: PACU and SICU  Anesthesia Type: General  Level of Consciousness: sedated  Airway & Oxygen Therapy: Patient placed on Ventilator (see vital sign flow sheet for setting)  Post-op Assessment: Report given to PACU RN and Post -op Vital signs reviewed and stable  Post vital signs: Reviewed and stable  Complications: No apparent anesthesia complications

## 2010-11-24 NOTE — Anesthesia Postprocedure Evaluation (Deleted)
  Anesthesia Post-op Note  Patient: [name] [name]  Procedure(s) Performed:  CORONARY ARTERY BYPASS GRAFTING (CABG) - Coronary Artery Bypass Graft times four on pump utilizing left internal mammary artery and bilateral greater saphenous veins harvested endoscopically, transesophageal echocardiogram   Patient Location: PACU and SICU  Anesthesia Type: General  Level of Consciousness: sedated  Airway and Oxygen Therapy: Patient placed on Ventilator (see vital sign flow sheet for setting)  Post-op Pain: none  Post-op Assessment: Post-op Vital signs reviewed, Patient's Cardiovascular Status Stable and Respiratory Function Stable  Post-op Vital Signs: Reviewed and stable  Complications: No apparent anesthesia complications

## 2010-11-24 NOTE — Brief Op Note (Signed)
11/24/2010 11/24/2010  11:30 AM  PATIENT:  [name] [name]  64 y.o. female  PRE-OPERATIVE DIAGNOSIS:  Coronary Artery Disease  POST-OPERATIVE DIAGNOSIS:  Coronary Artery Disease  PROCEDURE:  Procedure(s): CORONARY ARTERY BYPASS GRAFTING (CABG) x 4 (LIMA-LAD, SVG-D1, SVG-OM1, SVG-PL), EVH bilateral thighs  SURGEON:  Surgeon(s): Tharon Aquas Trigt III, MD  PHYSICIAN ASSISTANT: Suzzanne Cloud, PA-C  ANESTHESIA:   general  PATIENT CONDITION:  ICU - intubated and hemodynamically stable.  PRE-OPERATIVE WEIGHT: 96 kg

## 2010-11-24 NOTE — Anesthesia Postprocedure Evaluation (Signed)
  Anesthesia Post-op Note  Patient: [name] [name]  Procedure(s) Performed:  CORONARY ARTERY BYPASS GRAFTING (CABG) - Coronary Artery Bypass Graft times four on pump utilizing left internal mammary artery and bilateral greater saphenous veins harvested endoscopically, transesophageal echocardiogram   Patient Location: ICU  Anesthesia Type: General  Level of Consciousness: sedated  Airway and Oxygen Therapy: Patient remains intubated per anesthesia plan  Post-op Pain: none  Post-op Assessment: Post-op Vital signs reviewed, Patient's Cardiovascular Status Stable, Respiratory Function Stable, Patent Airway, No signs of Nausea or vomiting and Pain level controlled  Post-op Vital Signs: Reviewed and stable  Complications: No apparent anesthesia complications

## 2010-11-24 NOTE — Preoperative (Addendum)
Beta Blockers   Reason not to administer Beta Blockers:Not Applicable 

## 2010-11-24 NOTE — Anesthesia Procedure Notes (Signed)
Procedures  

## 2010-11-24 NOTE — Progress Notes (Signed)
*  PRELIMINARY RESULTS* Echocardiogram Echocardiogram Transesophageal has been performed.  Ebbie Ridge RDCS 11/24/2010, 9:06 AM

## 2010-11-24 NOTE — Anesthesia Preprocedure Evaluation (Addendum)
Anesthesia Evaluation  Patient identified by MRN, date of birth, ID band Patient awake    Reviewed: Allergy & Precautions, H&P , NPO status , Patient's Chart, lab work & pertinent test results, reviewed documented beta blocker date and time   Airway Mallampati: II TM Distance: >3 FB Neck ROM: Full    Dental  (+) Teeth Intact and Dental Advisory Given   Pulmonary    Pulmonary exam normal       Cardiovascular hypertension, Pt. on medications and Pt. on home beta blockers + CAD and + Past MI     Neuro/Psych PSYCHIATRIC DISORDERS TIACVA    GI/Hepatic   Endo/Other  Diabetes mellitus-, Poorly Controlled, Type 1  Renal/GU      Musculoskeletal   Abdominal   Peds  Hematology   Anesthesia Other Findings   Reproductive/Obstetrics                         Anesthesia Physical Anesthesia Plan  ASA: IV  Anesthesia Plan: General   Post-op Pain Management:    Induction: Intravenous  Airway Management Planned: Oral ETT  Additional Equipment:   Intra-op Plan:   Post-operative Plan: Post-operative intubation/ventilation  Informed Consent: I have reviewed the patients History and Physical, chart, labs and discussed the procedure including the risks, benefits and alternatives for the proposed anesthesia with the patient or authorized representative who has indicated his/her understanding and acceptance.   Dental advisory given  Plan Discussed with: CRNA and Surgeon  Anesthesia Plan Comments:         Anesthesia Quick Evaluation

## 2010-11-25 ENCOUNTER — Inpatient Hospital Stay (HOSPITAL_COMMUNITY): Payer: Medicare Other

## 2010-11-25 DIAGNOSIS — E1165 Type 2 diabetes mellitus with hyperglycemia: Secondary | ICD-10-CM

## 2010-11-25 DIAGNOSIS — IMO0001 Reserved for inherently not codable concepts without codable children: Secondary | ICD-10-CM

## 2010-11-25 LAB — POCT I-STAT, CHEM 8
BUN: 23 mg/dL (ref 6–23)
Calcium, Ion: 1.16 mmol/L (ref 1.12–1.32)
Chloride: 103 mEq/L (ref 96–112)
Creatinine, Ser: 1.2 mg/dL — ABNORMAL HIGH (ref 0.50–1.10)
Glucose, Bld: 125 mg/dL — ABNORMAL HIGH (ref 70–99)
HCT: 27 % — ABNORMAL LOW (ref 36.0–46.0)
Hemoglobin: 9.2 g/dL — ABNORMAL LOW (ref 12.0–15.0)
Potassium: 4.3 mEq/L (ref 3.5–5.1)
Sodium: 139 mEq/L (ref 135–145)
TCO2: 26 mmol/L (ref 0–100)

## 2010-11-25 LAB — GLUCOSE, CAPILLARY
Glucose-Capillary: 105 mg/dL — ABNORMAL HIGH (ref 70–99)
Glucose-Capillary: 105 mg/dL — ABNORMAL HIGH (ref 70–99)
Glucose-Capillary: 106 mg/dL — ABNORMAL HIGH (ref 70–99)
Glucose-Capillary: 107 mg/dL — ABNORMAL HIGH (ref 70–99)
Glucose-Capillary: 110 mg/dL — ABNORMAL HIGH (ref 70–99)
Glucose-Capillary: 110 mg/dL — ABNORMAL HIGH (ref 70–99)
Glucose-Capillary: 110 mg/dL — ABNORMAL HIGH (ref 70–99)
Glucose-Capillary: 110 mg/dL — ABNORMAL HIGH (ref 70–99)
Glucose-Capillary: 111 mg/dL — ABNORMAL HIGH (ref 70–99)
Glucose-Capillary: 112 mg/dL — ABNORMAL HIGH (ref 70–99)
Glucose-Capillary: 113 mg/dL — ABNORMAL HIGH (ref 70–99)
Glucose-Capillary: 116 mg/dL — ABNORMAL HIGH (ref 70–99)
Glucose-Capillary: 118 mg/dL — ABNORMAL HIGH (ref 70–99)
Glucose-Capillary: 119 mg/dL — ABNORMAL HIGH (ref 70–99)
Glucose-Capillary: 121 mg/dL — ABNORMAL HIGH (ref 70–99)
Glucose-Capillary: 123 mg/dL — ABNORMAL HIGH (ref 70–99)
Glucose-Capillary: 126 mg/dL — ABNORMAL HIGH (ref 70–99)
Glucose-Capillary: 135 mg/dL — ABNORMAL HIGH (ref 70–99)
Glucose-Capillary: 142 mg/dL — ABNORMAL HIGH (ref 70–99)
Glucose-Capillary: 143 mg/dL — ABNORMAL HIGH (ref 70–99)
Glucose-Capillary: 145 mg/dL — ABNORMAL HIGH (ref 70–99)
Glucose-Capillary: 149 mg/dL — ABNORMAL HIGH (ref 70–99)
Glucose-Capillary: 152 mg/dL — ABNORMAL HIGH (ref 70–99)
Glucose-Capillary: 160 mg/dL — ABNORMAL HIGH (ref 70–99)
Glucose-Capillary: 200 mg/dL — ABNORMAL HIGH (ref 70–99)
Glucose-Capillary: 91 mg/dL (ref 70–99)
Glucose-Capillary: 92 mg/dL (ref 70–99)

## 2010-11-25 LAB — CBC
HCT: 27.4 % — ABNORMAL LOW (ref 36.0–46.0)
HCT: 27.5 % — ABNORMAL LOW (ref 36.0–46.0)
Hemoglobin: 9.1 g/dL — ABNORMAL LOW (ref 12.0–15.0)
MCH: 27.5 pg (ref 26.0–34.0)
MCH: 27.5 pg (ref 26.0–34.0)
MCHC: 32.4 g/dL (ref 30.0–36.0)
MCV: 82.8 fL (ref 78.0–100.0)
MCV: 84.9 fL (ref 78.0–100.0)
Platelets: 147 10*3/uL — ABNORMAL LOW (ref 150–400)
RBC: 3.31 MIL/uL — ABNORMAL LOW (ref 3.87–5.11)
RDW: 15.5 % (ref 11.5–15.5)
WBC: 9.8 10*3/uL (ref 4.0–10.5)

## 2010-11-25 LAB — BASIC METABOLIC PANEL
BUN: 19 mg/dL (ref 6–23)
CO2: 25 mEq/L (ref 19–32)
Chloride: 107 mEq/L (ref 96–112)
Creatinine, Ser: 0.98 mg/dL (ref 0.50–1.10)
Glucose, Bld: 118 mg/dL — ABNORMAL HIGH (ref 70–99)

## 2010-11-25 LAB — PREPARE PLATELET PHERESIS: Unit division: 0

## 2010-11-25 LAB — MAGNESIUM: Magnesium: 2.8 mg/dL — ABNORMAL HIGH (ref 1.5–2.5)

## 2010-11-25 MED ORDER — DOBUTAMINE-DEXTROSE 2-5 MG/ML-% IV SOLN
2.5000 ug/kg/min | INTRAVENOUS | Status: DC
  Filled 2010-11-25 (×9): qty 250

## 2010-11-25 MED ORDER — POTASSIUM CHLORIDE 10 MEQ/50ML IV SOLN
10.0000 meq | INTRAVENOUS | Status: AC
  Administered 2010-11-25 (×2): 10 meq via INTRAVENOUS
  Filled 2010-11-25 (×2): qty 50

## 2010-11-25 MED ORDER — INSULIN GLARGINE 100 UNIT/ML ~~LOC~~ SOLN
30.0000 [IU] | Freq: Two times a day (BID) | SUBCUTANEOUS | Status: DC
  Administered 2010-11-25 – 2010-11-28 (×7): 30 [IU] via SUBCUTANEOUS
  Filled 2010-11-25: qty 3

## 2010-11-25 MED ORDER — TRAMADOL HCL 50 MG PO TABS
50.0000 mg | ORAL_TABLET | Freq: Four times a day (QID) | ORAL | Status: DC | PRN
  Administered 2010-11-27 – 2010-11-28 (×5): 50 mg via ORAL
  Filled 2010-11-25 (×5): qty 1

## 2010-11-25 MED ORDER — FUROSEMIDE 10 MG/ML IJ SOLN
INTRAMUSCULAR | Status: AC
  Administered 2010-11-25: 20 mg via INTRAVENOUS
  Filled 2010-11-25: qty 4

## 2010-11-25 MED ORDER — ENOXAPARIN SODIUM 30 MG/0.3ML ~~LOC~~ SOLN
30.0000 mg | Freq: Every day | SUBCUTANEOUS | Status: DC
  Administered 2010-11-25 – 2010-12-02 (×8): 30 mg via SUBCUTANEOUS
  Filled 2010-11-25 (×11): qty 0.3

## 2010-11-25 MED ORDER — FUROSEMIDE 10 MG/ML IJ SOLN
20.0000 mg | Freq: Four times a day (QID) | INTRAMUSCULAR | Status: DC
  Administered 2010-11-25 – 2010-11-26 (×5): 20 mg via INTRAVENOUS
  Filled 2010-11-25 (×7): qty 2

## 2010-11-25 MED ORDER — PROMETHAZINE HCL 25 MG/ML IJ SOLN: 12.5000 mg | Freq: Four times a day (QID) | INTRAMUSCULAR | Status: DC | PRN

## 2010-11-25 MED ORDER — SODIUM CHLORIDE 0.9 % IV SOLN: INTRAVENOUS | Status: DC | PRN

## 2010-11-25 NOTE — Progress Notes (Signed)
1 Day Post-Op Procedure(s) (LRB): CORONARY ARTERY BYPASS GRAFTING (CABG) (N/A)                     [address]            [place] [address]          [phone]        SubjectivPOD#1 CABGx4   Stable night  Objective:   AV pacing CXR w/ basilar atelectasis   Vital signs in last 24 hours: Temp:  [96 F (35.6 C)-98.8 F (37.1 C)] 96 F (35.6 C) (11/16 0710) Pulse Rate:  [52-96] 80  (11/16 0700) Cardiac Rhythm:  [-] A-V Sequential paced (11/16 0000) Resp:  [8-30] 17  (11/16 0700) BP: (99-139)/(45-60) 119/60 mmHg (11/16 0700) SpO2:  [40 %-100 %] 99 % (11/16 0700) Arterial Line BP: (80-179)/(40-80) 145/59 mmHg (11/16 0700) FiO2 (%):  [40 %-50 %] 40 % (11/15 1938) Weight:  [222 lb 3.6 oz (100.8 kg)] 222 lb 3.6 oz (100.8 kg) (11/16 0600)  Hemodynamic parameters for last 24 hours: PAP: (17-38)/(6-25) 28/16 mmHg CO:  [2.9 L/min-4.4 L/min] 4.4 L/min CI:  [1.4 L/min/m2-2.1 L/min/m2] 2.1 L/min/m2  Intake/Output from previous day: 11/15 0701 - 11/16 0700 In: 6788.2 [I.V.:4959.2; Blood:799; NG/GT:30; IV Piggyback:1000] Out: 3015 [Urine:3175; Blood:1400; Chest Tube:450] Intake/Output this shift:    EXAM Lungs clear NSR 2+ edema Lab Results:  Basename 11/25/10 0400 11/24/10 2024  WBC 9.8 7.4  HGB 9.1* 8.9*  HCT 27.4* 26.8*  PLT 147* 148*   BMET:  Basename 11/25/10 0400 11/24/10 2024 11/24/10 2019 11/22/10 1328  NA 139 -- 141 --  K 3.8 -- 4.4 --  CL 107 -- 107 --  CO2 25 -- -- 21  GLUCOSE 118* -- 117* --  BUN 19 -- 20 --  CREATININE 0.98 0.93 -- --  CALCIUM 8.4 -- -- 9.5    PT/INR:  Basename 11/24/10 1350  LABPROT 17.8*  INR 1.44   ABG    Component Value Date/Time   PHART 7.341* 11/24/2010 2205   HCO3 25.5* 11/24/2010 2205   TCO2 27 11/24/2010 2205   ACIDBASEDEF 1.0 11/24/2010 2205   O2SAT 98.0 11/24/2010 2205   CBG (last 3)   Basename 11/25/10 0607 11/25/10 0521 11/25/10 0410  GLUCAP 110* 113* 111*    Assessment/Plan: S/P  Procedure(s) (LRB): CORONARY ARTERY BYPASS GRAFTING (CABG) (N/A) D/c lines diurese DM control   LOS: 1 day    [name] [name],[name] [name] 11/25/2010

## 2010-11-25 NOTE — Progress Notes (Signed)
TCTS BRIEF SICU PROGRESS NOTE  1 Day Post-Op  S/P Procedure(s) (LRB): CORONARY ARTERY BYPASS GRAFTING (CABG) (N/A)   Stable day.  AV paced.  Ambulated some.  O2 sats 100% on 2 L/min. Diuresing some. CBG's increased.  Assessment/Plan:  Will start lantus  [name] [name] H

## 2010-11-25 NOTE — Clinical Documentation Improvement (Signed)
GENERIC DOCUMENTATION CLARIFICATION QUERY  Please update your documentation within the medical record to reflect your response to this query.                                                                                         11/25/10    Dear Dr.VanTrigt / Associates,  In a better effort to capture your patient's severity of illness, reflect appropriate length of stay and utilization of resources, a review of the patient medical record has revealed the following indicators.    Based on your clinical judgment, please clarify and document in a progress note and/or discharge summary the clinical condition associated with the following supporting information:  In responding to this query please exercise your independent judgment.  The fact that a query is asked, does not imply that any particular answer is desired or expected.  Possible Clinical Conditions?  __ Atelectasis   __Right Effusion  __Left Apical Pneumothorax  __Other Condition  __Cannot Clinically Determine    Diagnostics: CXR results 11/15: small right effusion; minimal atelectasis; tiny left apical pneumothorax with left chest tube present.    You may use possible, probable, or suspect with inpatient documentation. possible, probable, suspected diagnoses MUST be documented at the time of discharge  Reviewed: Noted basilar atelectasis in 11/25/10 progress notes.  Thank You,  Sincerely, Jeannetta Ellis, RN,BSN, Clinical Documentation Specialist:  Pager: [phone]

## 2010-11-25 NOTE — Clinical Documentation Improvement (Signed)
Anemia Blood Loss Clarification  Dear Dr.Tenise Stetler Rolley Sims  In an effort to better capture your patient's severity of illness, reflect appropriate length of stay and utilization of resources, a review of the patient medical record has revealed the following indicators.    Based on your clinical judgment, please clarify and document in a progress note and/or discharge summary the clinical condition associated with the following supporting information:  In responding to this query please exercise your independent judgment.  The fact that a query is asked, does not imply that any particular answer is desired or expected.  Possible Clinical Conditions?   " Expected Acute Blood Loss Anemia  " Acute Blood Loss Anemia  " Acute on chronic blood loss anemia  " Other Condition________________  " Cannot Clinically Determine   Supporting Information: Noted surgical blood loss per Op note 11/24/10.  H&H on 11/16: 9.1/27.4 H&H on 11/15: 8.9/26.8  Transfusion: 11/15: PRBC  Reviewed: yes   Thank You,  Sincerely, Theron Arista,  Clinical Documentation Specialist:  Pager: [phone]

## 2010-11-26 ENCOUNTER — Inpatient Hospital Stay (HOSPITAL_COMMUNITY): Payer: Medicare Other

## 2010-11-26 LAB — BASIC METABOLIC PANEL
BUN: 24 mg/dL — ABNORMAL HIGH (ref 6–23)
CO2: 28 mEq/L (ref 19–32)
Calcium: 8.2 mg/dL — ABNORMAL LOW (ref 8.4–10.5)
Chloride: 102 mEq/L (ref 96–112)
Creatinine, Ser: 1.25 mg/dL — ABNORMAL HIGH (ref 0.50–1.10)
GFR calc Af Amer: 52 mL/min — ABNORMAL LOW (ref >90)
GFR calc non Af Amer: 45 mL/min — ABNORMAL LOW (ref >90)
Glucose, Bld: 82 mg/dL (ref 70–99)
Potassium: 4 mEq/L (ref 3.5–5.1)
Sodium: 137 mEq/L (ref 135–145)

## 2010-11-26 LAB — CBC
HCT: 25.5 % — ABNORMAL LOW (ref 36.0–46.0)
Hemoglobin: 8.2 g/dL — ABNORMAL LOW (ref 12.0–15.0)
MCH: 27.8 pg (ref 26.0–34.0)
MCHC: 32.2 g/dL (ref 30.0–36.0)
MCV: 86.4 fL (ref 78.0–100.0)
Platelets: 131 10*3/uL — ABNORMAL LOW (ref 150–400)
RBC: 2.95 MIL/uL — ABNORMAL LOW (ref 3.87–5.11)
RDW: 15.6 % — ABNORMAL HIGH (ref 11.5–15.5)
WBC: 10.1 10*3/uL (ref 4.0–10.5)

## 2010-11-26 LAB — GLUCOSE, CAPILLARY
Glucose-Capillary: 106 mg/dL — ABNORMAL HIGH (ref 70–99)
Glucose-Capillary: 118 mg/dL — ABNORMAL HIGH (ref 70–99)
Glucose-Capillary: 119 mg/dL — ABNORMAL HIGH (ref 70–99)
Glucose-Capillary: 131 mg/dL — ABNORMAL HIGH (ref 70–99)
Glucose-Capillary: 134 mg/dL — ABNORMAL HIGH (ref 70–99)
Glucose-Capillary: 136 mg/dL — ABNORMAL HIGH (ref 70–99)
Glucose-Capillary: 136 mg/dL — ABNORMAL HIGH (ref 70–99)
Glucose-Capillary: 196 mg/dL — ABNORMAL HIGH (ref 70–99)
Glucose-Capillary: 276 mg/dL — ABNORMAL HIGH (ref 70–99)
Glucose-Capillary: 313 mg/dL — ABNORMAL HIGH (ref 70–99)
Glucose-Capillary: 76 mg/dL (ref 70–99)
Glucose-Capillary: 84 mg/dL (ref 70–99)
Glucose-Capillary: 91 mg/dL (ref 70–99)
Glucose-Capillary: 92 mg/dL (ref 70–99)
Glucose-Capillary: 98 mg/dL (ref 70–99)
Glucose-Capillary: 99 mg/dL (ref 70–99)

## 2010-11-26 MED ORDER — FUROSEMIDE 10 MG/ML IJ SOLN
40.0000 mg | Freq: Four times a day (QID) | INTRAMUSCULAR | Status: DC
  Administered 2010-11-26 – 2010-11-27 (×5): 40 mg via INTRAVENOUS
  Filled 2010-11-26 (×8): qty 4

## 2010-11-26 NOTE — Progress Notes (Signed)
   CARDIOTHORACIC SURGERY PROGRESS NOTE   R2 Days Post-Op Procedure(s) (LRB): CORONARY ARTERY BYPASS GRAFTING (CABG) (N/A)  Subjective: Feels okay.  Eating breakfast. Ambulated fairly well  Objective: Vital signs: Filed Vitals:   11/26/10 0800  BP: 87/41  Pulse: 80  Temp: 98.1 F (36.7 C)  Resp: 6    Hemodynamics: PAP: (21)/(18) 21/18 mmHg  Physical Exam:  Rhythm:   sinus  Breath sounds: diminished  Heart sounds:  RRR  Incisions:  dry  Abdomen:  soft  Extremities:  Warm, swollen   Intake/Output from previous day: 11/16 0701 - 11/17 0700 In: 2254.4 [P.O.:1020; I.V.:1020.4; IV Piggyback:214] Out: 1450 [Urine:1430; Chest Tube:20] Intake/Output this shift: Total I/O In: 82.4 [I.V.:80.4; IV Piggyback:2] Out: 20 [Urine:20]  Lab Results:  [facility] 11/26/10 0345 11/25/10 1619  WBC 10.1 11.7*  HGB 8.2* 8.9*  HCT 25.5* 27.5*  PLT 131* 150   BMET:  Basename 11/26/10 0345 11/25/10 1619 11/25/10 1612 11/25/10 0400  NA 137 -- 139 --  K 4.0 -- 4.3 --  CL 102 -- 103 --  CO2 28 -- -- 25  GLUCOSE 82 -- 125* --  BUN 24* -- 23 --  CREATININE 1.25* 1.09 -- --  CALCIUM 8.2* -- -- 8.4    CBG (last 3)   Basename 11/26/10 0821 11/26/10 0714 11/26/10 0610  GLUCAP 134* 98 92   ABG    Component Value Date/Time   PHART 7.341* 11/24/2010 2205   HCO3 25.5* 11/24/2010 2205   TCO2 26 11/25/2010 1612   ACIDBASEDEF 1.0 11/24/2010 2205   O2SAT 98.0 11/24/2010 2205     Assessment/Plan: S/P Procedure(s) (LRB): CORONARY ARTERY BYPASS GRAFTING (CABG) (N/A)  Stable POD2. Needs mobility, diuresis. No follow up from diabetes team. CBGs stable off insulin drip on lantus 30 bid. Increase lasix.   [name] [name],[name] [name]

## 2010-11-27 ENCOUNTER — Inpatient Hospital Stay (HOSPITAL_COMMUNITY): Payer: Medicare Other

## 2010-11-27 LAB — BASIC METABOLIC PANEL
BUN: 32 mg/dL — ABNORMAL HIGH (ref 6–23)
Calcium: 7.9 mg/dL — ABNORMAL LOW (ref 8.4–10.5)
GFR calc non Af Amer: 45 mL/min — ABNORMAL LOW (ref >90)
Glucose, Bld: 264 mg/dL — ABNORMAL HIGH (ref 70–99)
Sodium: 135 mEq/L (ref 135–145)

## 2010-11-27 LAB — GLUCOSE, CAPILLARY
Glucose-Capillary: 105 mg/dL — ABNORMAL HIGH (ref 70–99)
Glucose-Capillary: 129 mg/dL — ABNORMAL HIGH (ref 70–99)
Glucose-Capillary: 155 mg/dL — ABNORMAL HIGH (ref 70–99)
Glucose-Capillary: 192 mg/dL — ABNORMAL HIGH (ref 70–99)
Glucose-Capillary: 77 mg/dL (ref 70–99)

## 2010-11-27 LAB — CBC
Hemoglobin: 8.1 g/dL — ABNORMAL LOW (ref 12.0–15.0)
MCH: 27.7 pg (ref 26.0–34.0)
MCHC: 32.4 g/dL (ref 30.0–36.0)

## 2010-11-27 MED ORDER — POTASSIUM CHLORIDE CRYS ER 20 MEQ PO TBCR
20.0000 meq | EXTENDED_RELEASE_TABLET | Freq: Two times a day (BID) | ORAL | Status: DC
  Administered 2010-11-27 – 2010-12-01 (×9): 20 meq via ORAL
  Filled 2010-11-27 (×12): qty 1

## 2010-11-27 MED ORDER — FUROSEMIDE 40 MG PO TABS
40.0000 mg | ORAL_TABLET | Freq: Two times a day (BID) | ORAL | Status: DC
  Administered 2010-11-27 – 2010-12-01 (×9): 40 mg via ORAL
  Filled 2010-11-27 (×13): qty 1

## 2010-11-27 NOTE — Progress Notes (Signed)
   CARDIOTHORACIC SURGERY PROGRESS NOTE   R3 Days Post-Op Procedure(s) (LRB): CORONARY ARTERY BYPASS GRAFTING (CABG) (N/A)  Subjective: Feels a little better. Appetite improving. Ambulating some.  Objective: Vital signs: Filed Vitals:   11/27/10 0800  BP: 100/38  Pulse:   Temp: 98.6 F (37 C)  Resp:     Hemodynamics:    Physical Exam:  Rhythm:   sinus  Breath sounds: clear  Heart sounds:  RRR  Incisions:  dry  Abdomen:  soft  Extremities:  warm   Intake/Output from previous day: 11/17 0701 - 11/18 0700 In: 1394.4 [P.O.:840; I.V.:540.4; IV Piggyback:14] Out: 1292 [Urine:2340] Intake/Output this shift: Total I/O In: 404 [P.O.:360; I.V.:40; IV Piggyback:4] Out: 550 [Urine:550]  Lab Results:  [facility] 11/27/10 1023 11/26/10 0345  WBC 9.8 10.1  HGB 8.1* 8.2*  HCT 25.0* 25.5*  PLT 157 131*   BMET:  Basename 11/27/10 1023 11/26/10 0345  NA 135 137  K 4.2 4.0  CL 98 102  CO2 29 28  GLUCOSE 264* 82  BUN 32* 24*  CREATININE 1.24* 1.25*  CALCIUM 7.9* 8.2*    CBG (last 3)   Basename 11/27/10 0829 11/27/10 0325 11/26/10 2334  GLUCAP 129* 105* 136*   ABG    Component Value Date/Time   PHART 7.341* 11/24/2010 2205   HCO3 25.5* 11/24/2010 2205   TCO2 26 11/25/2010 1612   ACIDBASEDEF 1.0 11/24/2010 2205   O2SAT 98.0 11/24/2010 2205     Assessment/Plan: S/P Procedure(s) (LRB): CORONARY ARTERY BYPASS GRAFTING (CABG) (N/A)  Stable POD3 Mobilize  Continue diuresis Watch anemia Transfer step-down  [employer]

## 2010-11-28 ENCOUNTER — Inpatient Hospital Stay (HOSPITAL_COMMUNITY): Payer: Medicare Other

## 2010-11-28 DIAGNOSIS — E1165 Type 2 diabetes mellitus with hyperglycemia: Secondary | ICD-10-CM

## 2010-11-28 DIAGNOSIS — IMO0001 Reserved for inherently not codable concepts without codable children: Secondary | ICD-10-CM

## 2010-11-28 LAB — GLUCOSE, CAPILLARY
Glucose-Capillary: 254 mg/dL — ABNORMAL HIGH (ref 70–99)
Glucose-Capillary: 92 mg/dL (ref 70–99)
Glucose-Capillary: 108 mg/dL — ABNORMAL HIGH (ref 70–99)
Glucose-Capillary: 161 mg/dL — ABNORMAL HIGH (ref 70–99)
Glucose-Capillary: 212 mg/dL — ABNORMAL HIGH (ref 70–99)
Glucose-Capillary: 256 mg/dL — ABNORMAL HIGH (ref 70–99)
Glucose-Capillary: 247 mg/dL — ABNORMAL HIGH (ref 70–99)
Glucose-Capillary: 156 mg/dL — ABNORMAL HIGH (ref 70–99)

## 2010-11-28 LAB — TYPE AND SCREEN
ABO/RH(D): O POS
Antibody Screen: NEGATIVE
Unit division: 0
Unit division: 0

## 2010-11-28 LAB — CBC
HCT: 24.9 % — ABNORMAL LOW (ref 36.0–46.0)
MCV: 85.6 fL (ref 78.0–100.0)
Platelets: 181 10*3/uL (ref 150–400)
RBC: 2.91 MIL/uL — ABNORMAL LOW (ref 3.87–5.11)
RDW: 15.1 % (ref 11.5–15.5)
WBC: 7.7 10*3/uL (ref 4.0–10.5)

## 2010-11-28 LAB — BASIC METABOLIC PANEL
BUN: 34 mg/dL — ABNORMAL HIGH (ref 6–23)
CO2: 32 mEq/L (ref 19–32)
Chloride: 100 mEq/L (ref 96–112)
Creatinine, Ser: 1.12 mg/dL — ABNORMAL HIGH (ref 0.50–1.10)
GFR calc Af Amer: 59 mL/min — ABNORMAL LOW (ref >90)
Potassium: 4.1 mEq/L (ref 3.5–5.1)

## 2010-11-28 MED ORDER — CARVEDILOL 12.5 MG PO TABS
12.5000 mg | ORAL_TABLET | Freq: Two times a day (BID) | ORAL | Status: DC
  Filled 2010-11-28 (×4): qty 1

## 2010-11-28 MED ORDER — MAGNESIUM HYDROXIDE 400 MG/5ML PO SUSP: 30.0000 mL | Freq: Every day | ORAL | Status: DC | PRN

## 2010-11-28 MED ORDER — TRAMADOL HCL 50 MG PO TABS: 50.0000 mg | ORAL_TABLET | ORAL | Status: DC | PRN

## 2010-11-28 MED ORDER — LISINOPRIL 40 MG PO TABS
40.0000 mg | ORAL_TABLET | Freq: Every day | ORAL | Status: DC
  Administered 2010-11-29 – 2010-12-03 (×5): 40 mg via ORAL
  Filled 2010-11-28 (×5): qty 1

## 2010-11-28 MED ORDER — BISACODYL 10 MG RE SUPP: 10.0000 mg | Freq: Every day | RECTAL | Status: DC | PRN

## 2010-11-28 MED ORDER — SODIUM CHLORIDE 0.9 % IJ SOLN: 3.0000 mL | INTRAMUSCULAR | Status: DC | PRN

## 2010-11-28 MED ORDER — ONDANSETRON HCL 4 MG PO TABS: 4.0000 mg | ORAL_TABLET | Freq: Four times a day (QID) | ORAL | Status: DC | PRN

## 2010-11-28 MED ORDER — ASPIRIN EC 325 MG PO TBEC
325.0000 mg | DELAYED_RELEASE_TABLET | Freq: Every day | ORAL | Status: DC
  Administered 2010-11-29 – 2010-12-03 (×5): 325 mg via ORAL
  Filled 2010-11-28 (×5): qty 1

## 2010-11-28 MED ORDER — ALPRAZOLAM 0.25 MG PO TABS
0.2500 mg | ORAL_TABLET | Freq: Four times a day (QID) | ORAL | Status: DC | PRN
  Administered 2010-12-01 – 2010-12-02 (×2): 0.25 mg via ORAL
  Filled 2010-11-28 (×2): qty 1

## 2010-11-28 MED ORDER — BISACODYL 5 MG PO TBEC: 10.0000 mg | DELAYED_RELEASE_TABLET | Freq: Every day | ORAL | Status: DC | PRN

## 2010-11-28 MED ORDER — DOCUSATE SODIUM 100 MG PO CAPS
200.0000 mg | ORAL_CAPSULE | Freq: Every day | ORAL | Status: DC
  Administered 2010-11-29 – 2010-12-03 (×5): 200 mg via ORAL
  Filled 2010-11-28 (×5): qty 2

## 2010-11-28 MED ORDER — OXYCODONE HCL 5 MG PO TABS
5.0000 mg | ORAL_TABLET | ORAL | Status: DC | PRN
  Administered 2010-11-28: 5 mg via ORAL
  Administered 2010-11-29 – 2010-12-03 (×10): 10 mg via ORAL
  Filled 2010-11-28 (×9): qty 2
  Filled 2010-11-28: qty 1
  Filled 2010-11-28: qty 2

## 2010-11-28 MED ORDER — ONDANSETRON HCL 4 MG/2ML IJ SOLN
4.0000 mg | Freq: Four times a day (QID) | INTRAMUSCULAR | Status: DC | PRN
  Administered 2010-11-29: 4 mg via INTRAVENOUS
  Filled 2010-11-28: qty 2

## 2010-11-28 MED ORDER — INSULIN ASPART 100 UNIT/ML ~~LOC~~ SOLN
0.0000 [IU] | Freq: Three times a day (TID) | SUBCUTANEOUS | Status: DC
  Administered 2010-11-28 – 2010-11-29 (×2): 2 [IU] via SUBCUTANEOUS
  Administered 2010-11-29: 8 [IU] via SUBCUTANEOUS
  Administered 2010-11-29: 2 [IU] via SUBCUTANEOUS
  Administered 2010-11-29: 8 [IU] via SUBCUTANEOUS
  Filled 2010-11-28: qty 3

## 2010-11-28 MED ORDER — ALUM & MAG HYDROXIDE-SIMETH 400-400-40 MG/5ML PO SUSP
15.0000 mL | ORAL | Status: DC | PRN
  Filled 2010-11-28: qty 30

## 2010-11-28 MED ORDER — ACETAMINOPHEN 325 MG PO TABS: 650.0000 mg | ORAL_TABLET | Freq: Four times a day (QID) | ORAL | Status: DC | PRN

## 2010-11-28 MED ORDER — CLOPIDOGREL BISULFATE 75 MG PO TABS
75.0000 mg | ORAL_TABLET | Freq: Every day | ORAL | Status: DC
  Administered 2010-11-29 – 2010-12-03 (×5): 75 mg via ORAL
  Filled 2010-11-28 (×6): qty 1

## 2010-11-28 MED ORDER — SODIUM CHLORIDE 0.9 % IJ SOLN
3.0000 mL | Freq: Two times a day (BID) | INTRAMUSCULAR | Status: DC
  Administered 2010-11-29 – 2010-12-03 (×9): 3 mL via INTRAVENOUS

## 2010-11-28 MED ORDER — SODIUM CHLORIDE 0.9 % IV SOLN: 250.0000 mL | INTRAVENOUS | Status: DC

## 2010-11-28 MED ORDER — PANTOPRAZOLE SODIUM 40 MG PO TBEC
40.0000 mg | DELAYED_RELEASE_TABLET | Freq: Every day | ORAL | Status: DC
  Administered 2010-11-29 – 2010-12-03 (×5): 40 mg via ORAL
  Filled 2010-11-28 (×5): qty 1

## 2010-11-28 MED ORDER — MOVING RIGHT ALONG BOOK
Freq: Once | Status: AC
  Administered 2010-11-28: 19:00:00
  Filled 2010-11-28: qty 1

## 2010-11-28 MED ORDER — POVIDONE-IODINE 10 % EX SOLN
1.0000 "application " | Freq: Two times a day (BID) | CUTANEOUS | Status: DC
  Administered 2010-11-28 – 2010-12-03 (×10): 1 via TOPICAL
  Filled 2010-11-28: qty 15

## 2010-11-28 MED FILL — Sodium Chloride IV Soln 0.9%: INTRAVENOUS | Qty: 1000 | Status: AC

## 2010-11-28 MED FILL — Sodium Chloride Irrigation Soln 0.9%: Qty: 3000 | Status: AC

## 2010-11-28 MED FILL — Heparin Sodium (Porcine) Inj 1000 Unit/ML: INTRAMUSCULAR | Qty: 2 | Status: AC

## 2010-11-28 MED FILL — Electrolyte-R (PH 7.4) Solution: INTRAVENOUS | Qty: 4000 | Status: AC

## 2010-11-28 NOTE — Progress Notes (Signed)
4 Days Post-Op Procedure(s) (LRB): CORONARY ARTERY BYPASS GRAFTING (CABG) (N/A) Subjective: Some incisional pain Walking in hall Looks ready to transfer Objective: Vital signs in last 24 hours: Temp:  [97.7 F (36.5 C)-99 F (37.2 C)] 98.3 F (36.8 C) (11/19 0718) Pulse Rate:  [59-94] 63  (11/19 0800) Cardiac Rhythm:  [-] Normal sinus rhythm (11/19 0800) Resp:  [6-25] 13  (11/19 0800) BP: (96-147)/(42-79) 138/54 mmHg (11/19 0800) SpO2:  [96 %-100 %] 100 % (11/19 0800) Weight:  [220 lb 3.8 oz (99.9 kg)] 220 lb 3.8 oz (99.9 kg) (11/19 0500)  Hemodynamic parameters for last 24 hours:  stable  Intake/Output from previous day: 11/18 0701 - 11/19 0700 In: 868 [P.O.:720; I.V.:140; IV Piggyback:8] Out: 2550 [Urine:2550] Intake/Output this shift: Total I/O In: -  Out: 200 [Urine:200]  Lungs clear Incisions clean Neuro intact but weak  Lab Results:  Basename 11/28/10 0521 11/27/10 1023  WBC 7.7 9.8  HGB 8.0* 8.1*  HCT 24.9* 25.0*  PLT 181 157   BMET:  Basename 11/28/10 0521 11/27/10 1023  NA 138 135  K 4.1 4.2  CL 100 98  CO2 32 29  GLUCOSE 98 264*  BUN 34* 32*  CREATININE 1.12* 1.24*  CALCIUM 8.4 7.9*    PT/INR: No results found for this basename: LABPROT,INR in the last 72 hours ABG    Component Value Date/Time   PHART 7.341* 11/24/2010 2205   HCO3 25.5* 11/24/2010 2205   TCO2 26 11/25/2010 1612   ACIDBASEDEF 1.0 11/24/2010 2205   O2SAT 98.0 11/24/2010 2205   CBG (last 3)   Basename 11/28/10 0720 11/27/10 2336 11/27/10 1921  GLUCAP 108* 77 155*    Assessment/Plan: S/P Procedure(s) (LRB): CORONARY ARTERY BYPASS GRAFTING (CABG) (N/A) Plan for transfer to step-down: see transfer orders Watch BUN/Creat with weight up 10 lbs will start po lasix  LOS: 4 days    VAN TRIGT III,Krupa Stege 11/28/2010

## 2010-11-29 LAB — GLUCOSE, CAPILLARY
Glucose-Capillary: 145 mg/dL — ABNORMAL HIGH (ref 70–99)
Glucose-Capillary: 243 mg/dL — ABNORMAL HIGH (ref 70–99)
Glucose-Capillary: 228 mg/dL — ABNORMAL HIGH (ref 70–99)
Glucose-Capillary: 128 mg/dL — ABNORMAL HIGH (ref 70–99)

## 2010-11-29 MED ORDER — METFORMIN HCL 500 MG PO TABS
500.0000 mg | ORAL_TABLET | Freq: Two times a day (BID) | ORAL | Status: DC
  Administered 2010-11-29 – 2010-12-03 (×9): 500 mg via ORAL
  Filled 2010-11-29 (×11): qty 1

## 2010-11-29 MED ORDER — CARVEDILOL 6.25 MG PO TABS
6.2500 mg | ORAL_TABLET | Freq: Two times a day (BID) | ORAL | Status: DC
  Administered 2010-11-29 – 2010-12-03 (×9): 6.25 mg via ORAL
  Filled 2010-11-29 (×11): qty 1

## 2010-11-29 MED ORDER — GLIMEPIRIDE 4 MG PO TABS
4.0000 mg | ORAL_TABLET | Freq: Every day | ORAL | Status: DC
  Administered 2010-11-29 – 2010-12-03 (×5): 4 mg via ORAL
  Filled 2010-11-29 (×6): qty 1

## 2010-11-29 MED ORDER — INSULIN GLARGINE 100 UNIT/ML ~~LOC~~ SOLN
5.0000 [IU] | Freq: Two times a day (BID) | SUBCUTANEOUS | Status: DC
  Administered 2010-11-29 – 2010-12-01 (×6): 5 [IU] via SUBCUTANEOUS
  Filled 2010-11-29: qty 3

## 2010-11-29 MED ORDER — POLYSACCHARIDE IRON 150 MG PO CAPS
150.0000 mg | ORAL_CAPSULE | Freq: Every day | ORAL | Status: DC
  Administered 2010-11-29 – 2010-12-03 (×5): 150 mg via ORAL
  Filled 2010-11-29 (×5): qty 1

## 2010-11-29 MED ORDER — LACTULOSE 10 GM/15ML PO SOLN
30.0000 g | Freq: Once | ORAL | Status: AC
  Administered 2010-11-29: 30 g via ORAL
  Filled 2010-11-29: qty 45

## 2010-11-29 NOTE — Progress Notes (Addendum)
5 Days Post-Op Procedure(s) (LRB): CORONARY ARTERY BYPASS GRAFTING (CABG) (N/A)  Subjective: Patient with complaints of passing flatus but no bm yet and feeling "run down". She is still on 2L O2 via [place].  Objective: Vital signs in last 24 hours: Patient Vitals for the past 24 hrs:  BP Temp Temp src Pulse Resp SpO2 Height Weight  11/29/10 0514 - - - - - - - 216 lb 1.6 oz (98.022 kg)  11/29/10 0503 151/46 mmHg 98.4 F (36.9 C) Oral 63  18  98 % - -  11/28/10 2132 162/51 mmHg 97.9 F (36.6 C) Oral 64  18  98 % - -  11/28/10 1600 141/47 mmHg 98.9 F (37.2 C) Oral 63  19  99 % _0  (1.651 m) 220 lb 3.8 oz (99.9 kg)  11/28/10 1500 128/51 mmHg 98.3 F (36.8 C) Oral 58  13  99 % - -  11/28/10 1400 140/49 mmHg - - 57  12  98 % - -  11/28/10 1300 136/47 mmHg - - 58  11  100 % - -  11/28/10 1200 - - - 68  14  97 % - -  11/28/10 1142 - 98.5 F (36.9 C) Oral - - - - -  11/28/10 1100 - - - 86  17  93 % - -  11/28/10 1030 - - - 75  19  97 % - -  11/28/10 1000 121/45 mmHg - - 73  25  100 % - -  11/28/10 0900 - - - 71  18  99 % - -   Pre op weight  95.7 kg Current Weight  11/29/10 216 lb 1.6 oz (98.022 kg)        Intake/Output from previous day: 11/19 0701 - 11/20 0700 In: 263 [P.O.:260; I.V.:3] Out: 1500 [Urine:1500]   Physical Exam:  Cardiovascular: RRR, no murmurs, gallops, or rubs. Pulmonary: Decreased at the bases; no rales, wheezes, or rhonchi. Abdomen: Soft, non tender, bowel sounds present. Extremities: Mild bilateral lower extremity edema. Wounds: Clean and dry.  No erythema or signs of infection.  Lab Results: CBC: Basename 11/28/10 0521 11/27/10 1023  WBC 7.7 9.8  HGB 8.0* 8.1*  HCT 24.9* 25.0*  PLT 181 157   BMET:  Basename 11/28/10 0521 11/27/10 1023  NA 138 135  K 4.1 4.2  CL 100 98  CO2 32 29  GLUCOSE 98 264*  BUN 34* 32*  CREATININE 1.12* 1.24*  CALCIUM 8.4 7.9*    PT/INR: No results found for this basename: LABPROT,INR in the last 72 hours ABG:    INR: Will add last result for INR, ABG once components are confirmed Will add last 4 CBG results once components are confirmed  Assessment/Plan:  1. CV - SR. On Coreg 12.5 bid, Lisinopril 40 daily, Plavix 75 daily.  HR in the 60's this am so will decrease Coreg and monitor. 2.  Pulmonary - Encourage incentive spirometer, flutter valve. Wean O2 as tolerates. 3. Volume Overload - Continue with Lasix 40 bid. 4.  Acute blood loss anemia - H/H remains 8/24.9. Start Nu Iron and monitor. 5.DM-CBGs 256/247/156. Restart Amaryl and Metformin as taken pre op.  Will decrease scheduled insulin for now. HGA1C pre op 8.1 so will need follow up as an outpatient. 6. LOC constipation. 7.Continue with CRPI.   Nani Skillern, PA 11/29/2010   patient examined and medical record reviewed,agree with above note. VAN TRIGT III,PETER 11/29/2010

## 2010-11-29 NOTE — Progress Notes (Signed)
CARDIAC REHAB PHASE I   PRE:  Rate/Rhythm: 84SR  BP:  Supine: 187/56  Sitting:   Standing:    SaO2: 99%2L--84%RA  MODE:  Ambulation: 300 ft   POST:  Rate/Rhythem: 96  BP:  Supine:   Sitting: 157/98  Standing:    SaO2: 92%2L Pt walked 300 ft on O2  At 2L and rolling walker and asst x 2. Had to follow with chair. Pt sat 3 times due to weakness and dizziness. TO recliner after walk. Desat on RA. PT to see this pm. [phone]  Jeani Sow

## 2010-11-29 NOTE — Progress Notes (Signed)
Physical Therapy Evaluation Patient Details Name: [name] [name] MRN: [id number] DOB: Dec 09, 1945 Today's Date: 11/29/2010  Problem List:  Patient Active Problem List  Diagnoses  . MIXED HYPERLIPIDEMIA  . TOBACCO ABUSE  . CORONARY ATHEROSCLEROSIS NATIVE CORONARY ARTERY  . CHRONIC DIASTOLIC HEART FAILURE  . ESSENTIAL HYPERTENSION, BENIGN  . PAD (peripheral artery disease)  . Carotid artery disease  . Chronic systolic congestive heart failure    Past Medical History:  Past Medical History  Diagnosis Date  . Coronary atherosclerosis of native coronary artery     Multivessel  . Chronic diastolic heart failure   . Mixed hyperlipidemia   . Essential hypertension, benign   . Depression   . Cellulitis     Recurrent, bilateral legs  . Arthritis   . Carotid artery disease   . PAD (peripheral artery disease)   . Chronic systolic congestive heart failure     EF 35-40% due to ischemic cardiomyopathy  . Diabetes mellitus   . Suicide attempt 08/2002  . TIA (transient ischemic attack)   . Cellulitis   . Abdominal pain   . TMJ syndrome   . Acute bronchitis   . Acute cystitis   . Stroke 08/2009    hesistation with speaking, draging of right foot, not able to write as well (right handed) affected right side, was seeing a neurologist in [place] who is no longer in practice   . Myocardial infarction 10/2010    Cardiologist is with [facility] Dr. Fletcher Anon  . Joint pain   . Ruptured disk   . Herniated disc    Past Surgical History:  Past Surgical History  Procedure Date  . Appendectomy   . Cholecystectomy   . Abdominal hysterectomy   . Carotid endarterectomy     Bilateral - Dr. Kellie Simmering  . Cardiac catheterization 11/2009    EF 50%  . Cardiac catheterization 10/2010    significant 3 vessel CAD. EF 35-40%.   . Other surgical history     left wrist bone graft    PT Assessment/Plan/Recommendation PT Assessment Clinical Impression Statement: pt is a 65 y/o female s/p CABG that can  benefit from PT on acute and at home due to generally decr strength, decr. activity tolerance and mildly decreased balance/ gait instability PT Recommendation/Assessment: Patient will need skilled PT in the acute care venue PT Problem List: Decreased strength;Decreased activity tolerance;Decreased balance;Decreased mobility;Decreased knowledge of precautions;Pain PT Therapy Diagnosis : Generalized weakness;Difficulty walking PT Plan PT Frequency: Min 3X/week PT Treatment/Interventions: DME instruction;Gait training;Stair training;Functional mobility training;Balance training;Patient/family education PT Recommendation Follow Up Recommendations: Home health PT Equipment Recommended: Defer to next venue PT Goals  Acute Rehab PT Goals PT Goal Formulation: With patient Time For Goal Achievement: 7 days Pt will go Supine/Side to Sit: with supervision PT Goal: Supine/Side to Sit - Progress: Other (comment) Pt will Transfer Sit to Stand/Stand to Sit: with supervision;without upper extremity assist PT Transfer Goal: Sit to Stand/Stand to Sit - Progress: Other (comment) Pt will Transfer Bed to Chair/Chair to Bed: with supervision PT Transfer Goal: Bed to Chair/Chair to Bed - Progress: Other (comment) Pt will Ambulate: >150 feet;with supervision;with rolling walker PT Goal: Ambulate - Progress: Other (comment) Pt will Go Up / Down Stairs: 1-2 stairs;with least restrictive assistive device PT Goal: Up/Down Stairs - Progress: Other (comment)  PT Evaluation Precautions/Restrictions  Precautions Precautions: Sternal Restrictions Weight Bearing Restrictions: No Prior Functioning  Home Living Lives With: Alone Receives Help From: Family Type of Home: House Home Layout: One level;Full  bath on main level;Able to live on main level with bedroom/bathroom Home Access: Stairs to enter Entrance Stairs-Rails: None Entrance Stairs-Number of Steps: 3 Bathroom Shower/Tub: Print production planner: Standard Home Adaptive Equipment: Walker - rolling Prior Function Level of Independence: Independent with homemaking with ambulation;Independent with basic ADLs Able to Take Stairs?: Yes Driving: Yes Cognition Cognition Arousal/Alertness: Awake/alert Overall Cognitive Status: Appears within functional limits for tasks assessed Orientation Level: Oriented X4 Sensation/Coordination Coordination Gross Motor Movements are Fluid and Coordinated: Yes Fine Motor Movements are Fluid and Coordinated: Yes Extremity Assessment RLE Assessment RLE Assessment: Within Functional Limits LLE Assessment LLE Assessment: Within Functional Limits Mobility (including Balance) Bed Mobility Bed Mobility: Yes Supine to Sit: 4: Min assist;HOB flat Supine to Sit Details (indicate cue type and reason): vc for rechnique Sit to Supine - Left: Other (comment) (min guard A; pt needed help getting positioned up in bed) Transfers Transfers: Yes Sit to Stand: 4: Min assist;From bed;Without upper extremity assist Sit to Stand Details (indicate cue type and reason): visual/vc's for technique (for sternal prec) Ambulation/Gait Ambulation/Gait: Yes Ambulation/Gait Assistance: Other (comment) (min guard A) Ambulation Distance (Feet): 120 Feet Assistive device: Rolling walker Gait Pattern: Within Functional Limits;Step-through pattern Stairs: No  Posture/Postural Control Posture/Postural Control: Postural limitations Postural Limitations: flexed posture; vc's for corrction Balance Balance Assessed: Yes (mildly unsteady throughout) Exercise    End of Session PT - End of Session Activity Tolerance: Patient tolerated treatment well General Cognition: WFL for tasks performed  Calixto Pavel, Tessie Fass 11/29/2010, 4:18 PM  11/29/2010  Donnella Sham, [place] [phone] (pager)

## 2010-11-29 NOTE — Progress Notes (Signed)
UR Completed.   [name] [name] 11/29/2010

## 2010-11-30 ENCOUNTER — Encounter: Payer: Self-pay | Admitting: Cardiothoracic Surgery

## 2010-11-30 LAB — GLUCOSE, CAPILLARY
Glucose-Capillary: 109 mg/dL — ABNORMAL HIGH (ref 70–99)
Glucose-Capillary: 213 mg/dL — ABNORMAL HIGH (ref 70–99)
Glucose-Capillary: 153 mg/dL — ABNORMAL HIGH (ref 70–99)

## 2010-11-30 MED ORDER — INSULIN ASPART 100 UNIT/ML ~~LOC~~ SOLN
0.0000 [IU] | Freq: Two times a day (BID) | SUBCUTANEOUS | Status: DC
  Administered 2010-11-30: 8 [IU] via SUBCUTANEOUS
  Administered 2010-12-01: 16 [IU] via SUBCUTANEOUS
  Administered 2010-12-02: 5 [IU] via SUBCUTANEOUS

## 2010-11-30 MED ORDER — LEVALBUTEROL HCL 0.63 MG/3ML IN NEBU
0.6300 mg | INHALATION_SOLUTION | Freq: Three times a day (TID) | RESPIRATORY_TRACT | Status: DC
  Filled 2010-11-30 (×4): qty 3

## 2010-11-30 MED FILL — Dexmedetomidine HCl IV Soln 200 MCG/2ML: INTRAVENOUS | Qty: 2 | Status: AC

## 2010-11-30 MED FILL — Magnesium Sulfate Inj 50%: INTRAMUSCULAR | Qty: 10 | Status: AC

## 2010-11-30 MED FILL — Potassium Chloride Inj 2 mEq/ML: INTRAVENOUS | Qty: 40 | Status: AC

## 2010-11-30 NOTE — Progress Notes (Addendum)
6 Days Post-Op Procedure(s) (LRB): CORONARY ARTERY BYPASS GRAFTING (CABG) (N/A)  Subjective: Patient finished breakfast.  Had a bm 11/20. No real complaints this am.  Objective: Vital signs in last 24 hours: Patient Vitals for the past 24 hrs:  BP Temp Temp src Pulse Resp SpO2 Weight  11/30/10 0628 169/59 mmHg 97.8 F (36.6 C) Oral 65  18  97 % 215 lb 9.8 oz (97.8 kg)  11/29/10 1953 136/44 mmHg 98.3 F (36.8 C) Oral 62  19  100 % -  11/29/10 1403 138/39 mmHg 98.3 F (36.8 C) Oral 62  18  100 % -   Pre op weight  95.7 kg Current Weight  11/30/10 215 lb 9.8 oz (97.8 kg)        Intake/Output from previous day: 11/20 0701 - 11/21 0700 In: 720 [P.O.:720] Out: 1051 [Urine:1050; Stool:1]   Physical Exam:  Cardiovascular: RRR, no murmurs, gallops, or rubs. Pulmonary: Slightly decreased at the bases; no rales, wheezes, or rhonchi. Abdomen: Soft, non tender, bowel sounds present. Extremities: Mild bilateral lower extremity edema. Venous statis changes at lower legs. Wounds: Clean and dry.  No erythema or signs of infection.  Lab Results: CBC:  Basename 11/28/10 0521 11/27/10 1023  WBC 7.7 9.8  HGB 8.0* 8.1*  HCT 24.9* 25.0*  PLT 181 157   BMET:   Basename 11/28/10 0521 11/27/10 1023  NA 138 135  K 4.1 4.2  CL 100 98  CO2 32 29  GLUCOSE 98 264*  BUN 34* 32*  CREATININE 1.12* 1.24*  CALCIUM 8.4 7.9*    PT/INR: No results found for this basename: LABPROT,INR in the last 72 hours ABG:  INR: Will add last result for INR, ABG once components are confirmed Will add last 4 CBG results once components are confirmed  Assessment/Plan:  1. CV - SR. On Coreg 6.25 bid, Lisinopril 40 daily, Plavix 75 daily.  HR in the 60's this am  so will monitor as may need to further decrease Coreg. 2.  Pulmonary - Encourage incentive spirometer, flutter valve. Wean O2 as tolerates. 3. Volume Overload - Continue with Lasix 40 bid. 4.  Acute blood loss anemia - H/H remains 8/24.9.  Continue Nu Iron. 5.DM-CBGs 228/128/109. Continue Amaryl and Metformin as taken pre op.  Continue with low dose glargine bid. HGA1C pre op 8.1 so will need follow up as an outpatient. 6.Continue with CRPI.    Pt. Examined, chart reviewed. Agree with above.  She is making daily progress.

## 2010-11-30 NOTE — Progress Notes (Signed)
Physical Therapy Treatment Patient Details Name: [name] [name] MRN: [id number] DOB: Jan 09, 1946 Today's Date: 11/30/2010  PT Assessment/Plan  PT - Assessment/Plan Comments on Treatment Session: Pt. is progressing well with ambulation, walking with 2L O2.  Pt. is able to state her sternal precautions but needs reinforcement to physically maintain them.  Pt. tolerated treatment well until onset of sternal pain rated 6-7/10 with hip flexion/marching, and nursing was notified promptly.   PT Plan: Discharge plan remains appropriate Follow Up Recommendations: Home health PT Equipment Recommended: Defer to next venue PT Goals  Acute Rehab PT Goals PT Goal: Supine/Side to Sit - Progress: Other (comment) (Not addressed today. ) PT Transfer Goal: Sit to Stand/Stand to Sit - Progress: Progressing toward goal PT Transfer Goal: Bed to Chair/Chair to Bed - Progress:  (Not addressed today. ) PT Goal: Ambulate - Progress: Progressing toward goal PT Goal: Up/Down Stairs - Progress:  (Not addressed today. )  PT Treatment Precautions/Restrictions  Precautions Precautions: Sternal Restrictions Weight Bearing Restrictions: No Mobility (including Balance) Bed Mobility Bed Mobility: Yes Sit to Supine - Left: 5: Supervision Sit to Supine - Left Details (indicate cue type and reason): Verbal cues to not use arms to lower herself in bed.  Transfers Transfers: Yes Sit to Stand: 4: Min assist;From chair/3-in-1;From toilet Sit to Stand Details (indicate cue type and reason): Verbal cues for sequence and sternal precautions, 2 trials.  Stand to Sit: 4: Min assist;To bed;To chair/3-in-1 Stand to Sit Details: Verbal cues for sequence and sternal precautions, 2 trials.  Stand Pivot Transfers: 5: Supervision Stand Pivot Transfer Details (indicate cue type and reason): 2 trials, first to left to 3-in-1, second to right to chair.  Ambulation/Gait Ambulation/Gait: Yes Ambulation/Gait Assistance:  (min guard  A) Ambulation Distance (Feet): 250 Feet Assistive device: Rolling walker Gait Pattern: Within Functional Limits;Step-through pattern    Exercise  General Exercises - Lower Extremity Long Arc Quad: AROM;Both;15 reps;Seated Hip Flexion/Marching: AROM;Both;Seated;Other reps (comment) (15 reps, complaints of chest pain at completion) End of Session PT - End of Session Equipment Utilized During Treatment: Gait belt Activity Tolerance: Patient tolerated treatment well (Pain in sternum at end of session, RN notified) Patient left: in bed;with call bell in reach Nurse Communication: Mobility status for transfers;Mobility status for ambulation General Behavior During Session: [facility] for tasks performed Cognition: [facility] for tasks performed  Barney Drain, SPT  11/30/2010, 5:05 PM  11/30/2010  Donnella Sham, PT [phone] (pager)

## 2010-11-30 NOTE — Progress Notes (Signed)
   CARE MANAGEMENT NOTE 11/30/2010  Patient:  [name] [name], [name] [name]   Account Number:  [id number]  Date Initiated:  11/29/2010  Documentation initiated by:  Luz Lex  Subjective/Objective Assessment:   Post op CABG x4 on 11-24-10  Lives at home alone.  Plans to go home with son. - has had [facility] in past for wound services.     Action/Plan:   Anticipated DC Date:  12/02/2010   Anticipated DC Plan:  [place]  CM consult      Choice offered to / List presented to:             Status of service:  In process, will continue to follow Medicare Important Message given?   (If response is "NO", the following Medicare IM given date fields will be blank) Date Medicare IM given:   Date Additional Medicare IM given:    Discharge Disposition:    Per UR Regulation:  Reviewed for med. necessity/level of care/duration of stay  Comments:  11/30/10 Akon Reinoso,RN,BSN 3893 MET WITH PT TO DISCUSS DC PLANS.  PTA, PT LIVES ALONE AND IS INDEPENDENT.  PT PLANS TO DC HOME WITH SON AND DAUGHTER IN LAW AT DC.  SHE HAS RW AT HOME. WILL FOLLOW FOR DC NEEDS AS PT PROGRESSES. Pager # [phone]  11-29-10 [place], RNBSN [phone] UR Completed.

## 2010-11-30 NOTE — Plan of Care (Signed)
Problem: Phase III Progression Outcomes Goal: O2 Sat remains > or equal to 90% on room air Outcome: Progressing At rest this is the case but desats during ambulation

## 2010-11-30 NOTE — Progress Notes (Signed)
CARDIAC REHAB PHASE I   PRE:  Rate/Rhythm: 62 SR  BP:  Supine: 146/43  Sitting:   Standing:    SaO2: 97 2L 84 RA 95 2L  MODE:  Ambulation: 350 ft   POST:  Rate/Rhythem: 72 SR  BP:  Supine:   Sitting: 162/52  Standing:    SaO2: 93 2L 1110-1150 Tolerated walk with assist X one and using walker andO2 2L. O2 d/c prior to walk sat 84. O2 reapplied 2L sat improved to 95. Able to walk 350 ft with only standing rest stops. To chair after walk with call light in reach.  Deon Pilling

## 2010-11-30 NOTE — Progress Notes (Deleted)
7 Days Post-Op Procedure(s) (LRB): CORONARY ARTERY BYPASS GRAFTING (CABG) (N/A)  Subjective: Patient with complaints of shortness of breath this am.  Objective: Vital signs in last 24 hours: Patient Vitals for the past 24 hrs:  BP Temp Temp src Pulse Resp SpO2 Weight  11/30/10 0628 169/59 mmHg 97.8 F (36.6 C) Oral 65  18  97 % 215 lb 9.8 oz (97.8 kg)  11/29/10 1953 136/44 mmHg 98.3 F (36.8 C) Oral 62  19  100 % -  11/29/10 1403 138/39 mmHg 98.3 F (36.8 C) Oral 62  18  100 % -   Pre op weight  95.7 kg Current Weight  11/30/10 215 lb 9.8 oz (97.8 kg)        Intake/Output from previous day: 11/20 0701 - 11/21 0700 In: 720 [P.O.:720] Out: 1051 [Urine:1050; Stool:1]   Physical Exam:  Cardiovascular:  Slightly tachycardic, no murmurs, gallops, or rubs. Pulmonary: Decreased at the bases and wheezes present; no rales,rhonchi. Abdomen: Soft, non tender, bowel sounds present. Extremities: Mild bilateral lower extremity edema. Wounds: Clean and dry.  No erythema or signs of infection.  Lab Results: CBC:  Basename 11/28/10 0521 11/27/10 1023  WBC 7.7 9.8  HGB 8.0* 8.1*  HCT 24.9* 25.0*  PLT 181 157   BMET:   Basename 11/28/10 0521 11/27/10 1023  NA 138 135  K 4.1 4.2  CL 100 98  CO2 32 29  GLUCOSE 98 264*  BUN 34* 32*  CREATININE 1.12* 1.24*  CALCIUM 8.4 7.9*    PT/INR: No results found for this basename: LABPROT,INR in the last 72 hours ABG:  INR: Will add last result for INR, ABG once components are confirmed Will add last 4 CBG results once components are confirmed  Assessment/Plan:  1. CV - SR. On Coreg 6.25 bid, Lisinopril 40 daily, Plavix 75 daily.  HR in the 60's this am and SBP is 130-160's. Monitor HR as may need to further decrease Coreg.   2.  Pulmonary - Encourage incentive spirometer, flutter valve. Wean O2 as tolerates. Xopenex  for wheezing. 3. Volume Overload - On Lasix gttp.  Will stop and start lasix 40 mg bid.  4.  Acute blood loss  anemia - H/H remains 8/24.9. Continue Nu Iron and monitor. 5.DM-CBGs 228/128/109. Continue Amaryl and Metformin as taken pre op. Continue with low dose insulin and monitor CBGs. HGA1C pre op 8.1 so will need follow up as an outpatient. 6.Continue with CRPI.

## 2010-12-01 LAB — GLUCOSE, CAPILLARY
Glucose-Capillary: 110 mg/dL — ABNORMAL HIGH (ref 70–99)
Glucose-Capillary: 220 mg/dL — ABNORMAL HIGH (ref 70–99)
Glucose-Capillary: 338 mg/dL — ABNORMAL HIGH (ref 70–99)
Glucose-Capillary: 112 mg/dL — ABNORMAL HIGH (ref 70–99)

## 2010-12-01 NOTE — Progress Notes (Addendum)
7 Days Post-Op Procedure(s) (LRB): CORONARY ARTERY BYPASS GRAFTING (CABG) (N/A)  Subjective: Patient with incisional pain.  Objective: Vital signs in last 24 hours: Patient Vitals for the past 24 hrs:  BP Temp Temp src Pulse Resp SpO2 Weight  12/01/10 0643 159/72 mmHg 98.3 F (36.8 C) Oral 64  20  95 % 213 lb 6.5 oz (96.8 kg)  11/30/10 2128 150/77 mmHg 98.2 F (36.8 C) Oral 58  20  98 % -  11/30/10 1621 145/66 mmHg - - 68  20  93 % -  11/30/10 1416 138/77 mmHg 98.2 F (36.8 C) Oral 63  18  95 % -   Pre op weight  95.7 kg Current Weight  12/01/10 213 lb 6.5 oz (96.8 kg)        Intake/Output from previous day: 11/21 0701 - 11/22 0700 In: 643 [P.O.:640; I.V.:3] Out: 1300 [Urine:1300]   Physical Exam:  Cardiovascular: RRR, no murmurs, gallops, or rubs. Pulmonary: Clear to auscultation bilaterally; no rales, wheezes, or rhonchi. Abdomen: Soft, non tender, bowel sounds present. Extremities: Trace bilateral lower extremity edema. Venous statis changes at lower legs. Wounds: Clean and dry.  No erythema or signs of infection.  Lab Results: CBC: No results found for this basename: WBC:2,HGB:2,HCT:2,PLT:2 in the last 72 hours BMET:  No results found for this basename: NA:2,K:2,CL:2,CO2:2,GLUCOSE:2,BUN:2,CREATININE:2,CALCIUM:2 in the last 72 hours  PT/INR: No results found for this basename: LABPROT,INR in the last 72 hours ABG:  INR: Will add last result for INR, ABG once components are confirmed Will add last 4 CBG results once components are confirmed  Assessment/Plan:  1. CV - SR. On Coreg 6.25 bid, Lisinopril 40 daily, Plavix 75 daily.  HR in the 60's this am  so will monitor as may need to further decrease Coreg. 2.  Pulmonary - Encourage incentive spirometer, flutter valve. Wean O2 as tolerates. 3. Volume Overload - Continue with Lasix 40 bid. Will probably decrease to daily in am. 4.  Acute blood loss anemia - H/H remains 8/24.9. Continue Nu Iron. 5.DM-CBGs  213/153/110 . Continue Amaryl and Metformin as taken pre op.  Continue with low dose glargine bid. HGA1C pre op 8.1 so will need follow up as an outpatient. 6.Continue with CRPI.    Chart reviewed and patient examined.  Agree with above.  She is almost back at baseline weight and has no peripheral edema. Heart rate in the 60's should be ok since her bp is not low.

## 2010-12-02 DIAGNOSIS — IMO0001 Reserved for inherently not codable concepts without codable children: Secondary | ICD-10-CM

## 2010-12-02 DIAGNOSIS — E1165 Type 2 diabetes mellitus with hyperglycemia: Secondary | ICD-10-CM

## 2010-12-02 LAB — GLUCOSE, CAPILLARY
Glucose-Capillary: 143 mg/dL — ABNORMAL HIGH (ref 70–99)
Glucose-Capillary: 226 mg/dL — ABNORMAL HIGH (ref 70–99)
Glucose-Capillary: 189 mg/dL — ABNORMAL HIGH (ref 70–99)
Glucose-Capillary: 211 mg/dL — ABNORMAL HIGH (ref 70–99)

## 2010-12-02 MED ORDER — ASPIRIN 325 MG PO TBEC: 325.0000 mg | DELAYED_RELEASE_TABLET | Freq: Every day | ORAL | Status: AC

## 2010-12-02 MED ORDER — FUROSEMIDE 40 MG PO TABS
40.0000 mg | ORAL_TABLET | Freq: Every day | ORAL | Status: DC
  Administered 2010-12-02 – 2010-12-03 (×2): 40 mg via ORAL
  Filled 2010-12-02 (×2): qty 1

## 2010-12-02 MED ORDER — FUROSEMIDE 40 MG PO TABS: 40.0000 mg | ORAL_TABLET | Freq: Every day | ORAL | Status: DC

## 2010-12-02 MED ORDER — OXYCODONE HCL 5 MG PO TABS: 5.0000 mg | ORAL_TABLET | ORAL | Status: AC | PRN

## 2010-12-02 MED ORDER — POTASSIUM CHLORIDE CRYS ER 20 MEQ PO TBCR
20.0000 meq | EXTENDED_RELEASE_TABLET | Freq: Every day | ORAL | Status: DC
  Administered 2010-12-02 – 2010-12-03 (×2): 20 meq via ORAL
  Filled 2010-12-02: qty 1

## 2010-12-02 MED ORDER — INSULIN GLARGINE 100 UNIT/ML ~~LOC~~ SOLN
8.0000 [IU] | Freq: Two times a day (BID) | SUBCUTANEOUS | Status: DC
  Administered 2010-12-02 – 2010-12-03 (×3): 8 [IU] via SUBCUTANEOUS

## 2010-12-02 MED ORDER — POLYSACCHARIDE IRON 150 MG PO CAPS: 150.0000 mg | ORAL_CAPSULE | Freq: Every day | ORAL | Status: DC

## 2010-12-02 MED ORDER — POTASSIUM CHLORIDE CRYS ER 20 MEQ PO TBCR: 20.0000 meq | EXTENDED_RELEASE_TABLET | Freq: Every day | ORAL | Status: DC

## 2010-12-02 MED ORDER — CARVEDILOL 12.5 MG PO TABS: 6.2500 mg | ORAL_TABLET | Freq: Two times a day (BID) | ORAL | Status: DC

## 2010-12-02 NOTE — Progress Notes (Signed)
   CARE MANAGEMENT NOTE 12/02/2010  Patient:  [name] [name], [name] [name]   Account Number:  [id number]  Date Initiated:  11/29/2010  Documentation initiated by:  Luz Lex  Subjective/Objective Assessment:   Post op CABG x4 on 11-24-10  Lives at home alone.  Plans to go home with son. - has had [facility] in past for wound services.     Action/Plan:   Anticipated DC Date:  12/02/2010   Anticipated DC Plan:  [place]  CM consult      [facility] Choice  HOME HEALTH   Choice offered to / List presented to:  C-1 Patient   DME arranged  [place]      DME agency  [place] arranged  HH-1 RN  [place].   Status of service:  Completed, signed off Medicare Important Message given?   (If response is "NO", the following Medicare IM given date fields will be blank) Date Medicare IM given:   Date Additional Medicare IM given:    Discharge Disposition:  [place]  Per UR Regulation:  Reviewed for med. necessity/level of care/duration of stay  Comments:  12/02/10 [name] Friberg,RN,BSN 1030 PHYSICAL THERAPY RECOMMENDING [facility]PT AND [facility] FOR HOME.  SHE WOULD ALSO BENEFIT FROM [facility] FOR RESTORATIVE CARE.  WILL OBTAIN ORDERS AND [place]; PT PREFERS [place] AND DME NEEDS.  START OF CARE 24-48H POST DC DATE. WILL FOLLOW UP TO SEE IF PT ABLE TO WEAN OFF O2.  BEDSIDE RN REPORTED SATS WERE IN 90S AT REST.  11/30/10 [name] Heinsohn,RN,BSN 1545 MET WITH PT TO DISCUSS DC PLANS.  PTA, PT LIVES ALONE AND IS INDEPENDENT.  PT PLANS TO DC HOME WITH SON AND DAUGHTER IN LAW AT DC.  SHE HAS RW AT HOME. WILL FOLLOW FOR DC NEEDS AS PT PROGRESSES.  11-29-10 [place], RNBSN [phone] UR Completed.   Lionel December, RN, BSN Pager # [phone]

## 2010-12-02 NOTE — Discharge Summary (Addendum)
Physician Discharge Summary  Patient ID: [name] [name] MRN: [id number] DOB/AGE: 63-Jul-1947 63 y.o.  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 Prescott Gum on 11/24/2010.   History of Presenting Illness: This is a 65 year oldCaucasian female with the aforementioned past medical history who presented to [facility] at the beginning of October with complaints of chest pain and shortness of breath. She had multiple EKG changes and her troponin was found to be elevated. She ruled in for NSTEMI. She was transported to [facility] in order to undergo cardiac catheterization as well as for further management.  She underwent cardiac catheterization by Dr. Fletcher Anon on 10/13/2010. Result showed a 50% LAD, 99% diagonal 1, 90% circumflex, 80 and 90% stenoses of the  PDA,  50% and 90% stenoses of the PLB, and an  EF of approximately 40% she was not felt to be a candidate for PCI and she  was discharged home in stable condition on medical therapy on 10/19/2010.  Patient was then seen in consultation the office by Dr. Prescott Gum on 11/04/2010 for consideration of coronary bypass grafting surgery. Wrapper of carotid Doppler study showed there is moderate carotid artery stenosis in (estimated be 50-60%. ABIs were proximally 0.6-0.7 bilaterally. perfusion to her right lower extremity was better than her left. Vein mapping showed adequate compressible saphenous vein bilaterally right vein appearing somewhat bigger than the left PFT showed an FEV1 of 2.1 and an oxygen saturation 94% on room air. Potential risks, complications, and benefits of the surgery discussed with the patient and she agreed to proceed. The patient was admitted to [facility] 11/24/2010 in order to undergo coronary bypass grafting x4 by Dr. Prescott Gum.  Brief Hospital Course: Patient was extubated the evening of surgery without difficulty. She remained afebrile and hemodynamically stable. She was initially AV paced. Her Swan-[place], A line, foley, and chest tubes were all removed early in her postoperative course.  As previously stated, she history of diabetes mellitus. She was weaned off her insulin drip that she tolerated her diet better she her oral diabetic medications were resumed as well as low-dose insulin. She was started on a low-dose beta blocker and this was titrated accordingly. He was found to be volume overloaded and diuresed accordingly.  Was also found to have acute blood loss anemia. Her H&H was low as 8 and 24.9. She did not require postoperative transfusion. She was started on Nu Iron.She was found to have mild thrombocytopenia ( her platelet count went as low as  low 131,000); however, there was resolution of his prior to discharge as her last platelet count was up to 181,000.  She was felt surgical stable for transfer from the intensive care unit to [facility] for further convalescence 11/28/2010. She was still requiring a  couple of liters of oxygen via nasal cannula. Attempts were made to wean her to room air. She continued to progress with cardiac rehabilitation. There've been tolerating a diet has had multiple bowel movements. EPW and chest tube sutures will be removed prior to her discharge. Provided she remains afebrile, hemodynamic a stable, she is weaned to room air, and pinning around evaluation, she was surgical stable for discharge on 12/03/2010.  Filed Vitals:   12/02/10 1337  BP: 129/67  Pulse: 65  Temp: 98.7 F (37.1 C)  Resp: 18     Latest Vital Signs: Blood pressure 129/67, pulse 65, temperature 98.7 F (37.1 C), temperature source Oral, resp. rate 18, height 5' 5" (1.651 m), weight 208 lb 15.9 oz (94.8 kg), SpO2 91.00%.  Physical Exam:  Cardiovascular: RRR, no murmurs, gallops, or rubs.  Pulmonary: Clear to auscultation bilaterally; no rales, wheezes, or rhonchi.  Abdomen: Soft, non tender, bowel sounds present.  Extremities: Trace bilateral lower extremity edema. Venous statis changes at lower legs.  Wounds: Clean and dry. No erythema or signs of infection.    Discharge Condition:Stable.  Recent laboratory studies:  Lab Results  Component Value Date   WBC 7.7 11/28/2010   HGB 8.0* 11/28/2010   HCT 24.9* 11/28/2010   MCV 85.6 11/28/2010   PLT 181 11/28/2010   Lab Results  Component Value Date   NA 138 11/28/2010   K 4.1 11/28/2010   CL 100 11/28/2010   CO2 32 11/28/2010   CREATININE 1.12* 11/28/2010   GLUCOSE 98 11/28/2010      Diagnostic Studies: Dg Chest 2 View  11/28/2010  *RADIOLOGY REPORT*  Clinical Data: Follow-up heart surgery.  CHEST - 2 VIEW  Comparison: 11/27/2010.  Findings: Trachea is midline.  Heart size stable.  Sternotomy wires are unchanged in position.  Epicardial pacer wires remain in place. Improving bibasilar aeration with residual mild air space disease at both lung bases.  Probable tiny bilateral pleural effusions.  IMPRESSION: Improving bibasilar  aeration with probable tiny bilateral pleural effusions.  Original Report Authenticated By: Luretha Rued, M.D.    Discharge Orders    Future Appointments: Provider: Department: Dept Phone: Center:   12/08/2010 11:00 AM [facility] [phone] [facility]   12/08/2010 11:30 AM [facility] [phone] [facility]   12/08/2010 1:00 PM Elam Dutch, MD [facility] [phone] [facility]      Discharge Medications: Current Discharge Medication List    START taking these medications   Details  furosemide (LASIX) 40 MG tablet Take 1 tablet (40 mg total) by mouth daily. For five days then stop. Qty: 5 tablet, Refills: 0    oxyCODONE (OXY IR/ROXICODONE) 5 MG immediate release tablet Take 1-2 tablets (5-10 mg total) by mouth every 4 (four) hours as needed. Qty: 45 tablet, Refills: 0    polysaccharide iron (NIFEREX) 150 MG CAPS capsule Take 1 capsule (150 mg total) by mouth daily. For one month then may stop. Qty: 30 each, Refills: 0    potassium chloride SA (K-DUR,KLOR-CON) 20 MEQ tablet Take 1 tablet (20 mEq total) by mouth daily. For five days then stop. Qty: 5 tablet, Refills: 0      CONTINUE these medications which have CHANGED   Details  aspirin EC 325 MG EC tablet Take 1 tablet (325 mg total) by mouth daily. Qty: 30 tablet  carvedilol (COREG) 12.5 MG tablet Take 0.5 tablets (6.25 mg total) by mouth 2 (two) times daily with a meal. Qty: 60 tablet, Refills: 1      CONTINUE these medications which have NOT CHANGED   Details  atorvastatin (LIPITOR) 80 MG tablet TAKE ONE TABLET BY MOUTH AT BEDTIME EMERGENCY REFILL FAXED DR Otho Darner: 30 tablet, Refills: 6    clopidogrel (PLAVIX) 75 MG tablet Take 1 tablet (75 mg total) by mouth daily. Qty: 30 tablet, Refills: 11   Associated Diagnoses: Coronary atherosclerosis of native coronary artery    escitalopram (LEXAPRO) 10 MG tablet Take 10 mg by mouth daily.      fexofenadine (ALLEGRA) 180 MG tablet Take 180 mg  by mouth daily.      glimepiride (AMARYL) 4 MG tablet Take 4 mg by mouth 2 (two) times daily.      insulin glargine (LANTUS) 100 UNIT/ML injection Inject 15-35 Units into the skin 2 (two) times daily. 15 units in the morning 35 units at night    insulin lispro (HUMALOG) 100 UNIT/ML injection Inject 10 Units into the skin 3 (three) times daily before meals. Sliding scale    lisinopril (PRINIVIL,ZESTRIL) 40 MG tablet Take 40 mg by mouth daily.      metFORMIN (GLUCOPHAGE) 500 MG tablet Take 500 mg by mouth 2 (two) times daily with a meal.      pantoprazole (PROTONIX) 40 MG tablet Take 1 tablet by mouth Daily.    Insulin Pen Needle (NOVOFINE 31) 31G X 6 MM MISC by Does not apply route.      varenicline (CHANTIX) 1 MG tablet Take 1 mg by mouth daily.        STOP taking these medications     amLODipine (NORVASC) 5 MG tablet      cyclobenzaprine (FLEXERIL) 10 MG tablet      ezetimibe-simvastatin (VYTORIN) 10-40 MG per tablet      isosorbide mononitrate (IMDUR) 60 MG 24 hr tablet      nitroGLYCERIN (NITROSTAT) 0.4 MG SL tablet      potassium chloride (K-DUR) 10 MEQ tablet      RANEXA 500 MG 12 hr tablet      spironolactone (ALDACTONE) 25 MG tablet      cefTRIAXone (ROCEPHIN) 1 G injection         Follow Up Appointments: Follow-up Information    Follow up with Kathlyn [place], MD. Make an appointment in 2 weeks. (Call for an appointment for 2 weeks.)    Contact information:   [place] [phone]       Follow up with VYAS,DHRUV B., MD. (Call for a follow up appointment regarding HGA1C 8.1)    Contact information:   [address] [place] [phone]       Make an appointment with VAN Wilber Oliphant, MD. (PA/LAT CXR to abe taken 45 minutes prior to office appointment;Office will call with an appointment date and time)    Contact information:   [place] [phone]          Signed: Nani Skillern, [place] 12/02/2010, 2:02 PM

## 2010-12-02 NOTE — Progress Notes (Deleted)
Phone [phone]

## 2010-12-02 NOTE — Progress Notes (Signed)
CARDIAC REHAB PHASE I   PRE:  Rate/Rhythm: 63 SR  BP:  Supine:  Sitting: 118/84  Standing:    SaO2: 94 RA  MODE:  Ambulation: 350 ft   POST:  Rate/Rhythem: 71  BP:  Supine:  Sitting: 118/80  Standing:    SaO2: 95 RA  Cynthya Yam Taylor  Pt ambulated 350 ft assist x 1 with rolling walker. Pt ambulated on room air per MD and RN instruction. Pt tolerated ambulation well, but did complain of mild dyspnea and being "tired." SAO2 remained 93-95% on RA throughout ambulation. Pt took two short rest breaks. Pt returned to bedside, vital signs stable. Call bell in reach. Will follow up. Thea Silversmith Dewy Rose 12/02/10 1327

## 2010-12-02 NOTE — Progress Notes (Addendum)
8 Days Post-Op Procedure(s) (LRB): CORONARY ARTERY BYPASS GRAFTING (CABG) (N/A)  Subjective: Patient slept fairly well. She is eating breakfast and has no complaints this morning.   Objective: Vital signs in last 24 hours: Patient Vitals for the past 24 hrs:  BP Temp Temp src Pulse Resp SpO2 Height Weight  12/02/10 0612 - - - - - - 5' 5" (1.651 m) 208 lb 15.9 oz (94.8 kg)  12/02/10 0500 143/79 mmHg 97.9 F (36.6 C) Oral 61  18  95 % - -  12/01/10 2200 146/75 mmHg 98.2 F (36.8 C) - 58  20  94 % - -  12/01/10 1444 129/60 mmHg 98.6 F (37 C) Oral 62  20  95 % - -   Pre op weight  95.7 kg Current Weight  12/02/10 208 lb 15.9 oz (94.8 kg)        Intake/Output from previous day: 11/22 0701 - 11/23 0700 In: 240 [P.O.:240] Out: 900 [Urine:900]   Physical Exam:  Cardiovascular: RRR, no murmurs, gallops, or rubs. Pulmonary: Clear to auscultation bilaterally; no rales, wheezes, or rhonchi. Abdomen: Soft, non tender, bowel sounds present. Extremities: Trace bilateral lower extremity edema. Venous statis changes at lower legs. Wounds: Clean and dry.  No erythema or signs of infection.  Lab Results: CBC: No results found for this basename: WBC:2,HGB:2,HCT:2,PLT:2 in the last 72 hours BMET:  No results found for this basename: NA:2,K:2,CL:2,CO2:2,GLUCOSE:2,BUN:2,CREATININE:2,CALCIUM:2 in the last 72 hours  PT/INR: No results found for this basename: LABPROT,INR in the last 72 hours ABG:  INR: Will add last result for INR, ABG once components are confirmed Will add last 4 CBG results once components are confirmed  Assessment/Plan:  1. CV - SR. On Coreg 6.25 bid, Lisinopril 40 daily, Plavix 75 daily.  HR remains in the 60's.  2.  Pulmonary - Encourage incentive spirometer, flutter valve. Wean O2 as tolerates(still on 2L via [place]) 3. Volume Overload - Has diuresed well with Lssix 40 bid.  Below pre op weight and much less edema so will decrease to daily. 4.  Acute blood loss  anemia - H/H remains 8/24.9. Continue Nu Iron. 5.DM-CBGs 338/112/143 . Continue Amaryl and Metformin as taken pre op.  Will increase   glargine for better glucose control. HGA1C pre op 8.1 so will need follow up as an outpatient. 6.Continue with CRPI. 7. Will remove EPW in am. 8.Possibly d/c 1-2 days if off O2.  patient examined and medical record reviewed,agree with above note. VAN TRIGT III,Rajon Bisig 12/02/2010

## 2010-12-02 NOTE — Progress Notes (Signed)
Physical Therapy Treatment Patient Details Name: [name] [name] MRN: [id number] DOB: [birth year] Today's Date: 12/02/2010  PT Assessment/Plan  PT - Assessment/Plan Comments on Treatment Session: Patient is progressing well with ambulation with steady gait throughout.  Ambulating with 2 L O2 and had to incr. to 3 L secondary to O2 desaturation.  Encouraged pursed lip breathing.  Continues to need some cues for sternal precautions especially with sit to stand.   PT Plan: Discharge plan remains appropriate;Frequency remains appropriate PT Frequency: Min 3X/week Follow Up Recommendations: Home health PT Equipment Recommended: 3 in 1 bedside comode PT Goals  Acute Rehab PT Goals PT Goal: Supine/Side to Sit - Progress: Other (comment) PT Transfer Goal: Sit to Stand/Stand to Sit - Progress: Progressing toward goal PT Transfer Goal: Bed to Chair/Chair to Bed - Progress: Other (comment) PT Goal: Ambulate - Progress: Met PT Goal: Up/Down Stairs - Progress: Other (comment)  PT Treatment Precautions/Restrictions  Precautions Precautions: Fall;Sternal Required Braces or Orthoses: No Restrictions Weight Bearing Restrictions: No Mobility (including Balance) Bed Mobility Supine to Sit: Not tested (comment) Sit to Supine - Left: Not tested (comment) Transfers Sit to Stand: 4: Min assist;From chair/3-in-1 Sit to Stand Details (indicate cue type and reason): vcues to place UEs on knees for sternal precautions.   Stand to Sit: 4: Min assist;To chair/3-in-1 Stand to Sit Details: verbal cues for sternal precautions and hands on knees. Stand Pivot Transfers: Not tested (comment) Ambulation/Gait Ambulation/Gait Assistance: 5: Supervision Ambulation/Gait Assistance Details (indicate cue type and reason): Occasional cue to stay close to RW Ambulation Distance (Feet): 400 Feet Assistive device: Rolling walker Gait Pattern: Step-through pattern;Within Functional Limits Stairs: No Wheelchair  Mobility Wheelchair Mobility: No  Posture/Postural Control Posture/Postural Control: No significant limitations Postural Limitations: Upright posture for the most part Balance Balance Assessed: No (no unsteadiness noted.) Exercise  General Exercises - Lower Extremity Long Arc Quad: AROM;Both;5 reps;Seated End of Session PT - End of Session Equipment Utilized During Treatment: Gait belt Activity Tolerance: Patient tolerated treatment well Patient left: in chair;with call bell in reach Nurse Communication: Mobility status for ambulation General Behavior During Session: [facility] for tasks performed Cognition: [facility] for tasks performed  [name] [name] [name] [name] 12/02/2010, 10:13 AM [facility] Acute Rehabilitation [phone] (pager)

## 2010-12-03 LAB — GLUCOSE, CAPILLARY
Glucose-Capillary: 112 mg/dL — ABNORMAL HIGH (ref 70–99)
Glucose-Capillary: 141 mg/dL — ABNORMAL HIGH (ref 70–99)

## 2010-12-03 NOTE — Progress Notes (Signed)
[id number] Cardiac Rehab  Completed discharge education with pt. And family. She agrees to [facility]. CRP in [place], will send referral.

## 2010-12-03 NOTE — Progress Notes (Addendum)
9 Days Post-Op  Procedure(s) (LRB): CORONARY ARTERY BYPASS GRAFTING (CABG) (N/A) Subjective: Feeling ok, without new complaints.  Objective  Telemetry NSR/sinus brady  Temp:  [97.8 F (36.6 C)-98.7 F (37.1 C)] 98.1 F (36.7 C) (11/24 0441) Pulse Rate:  [60-70] 60  (11/24 0441) Resp:  [18-20] 19  (11/24 0441) BP: (129-145)/(67-80) 145/80 mmHg (11/24 0441) SpO2:  [82 %-94 %] 90 % (11/24 0441) Weight:  [214 lb 1.6 oz (97.115 kg)] 214 lb 1.6 oz (97.115 kg) (11/24 0550)   Intake/Output Summary (Last 24 hours) at 12/03/10 0801 Last data filed at 12/03/10 0600  Gross per 24 hour  Intake    960 ml  Output   1100 ml  Net   -140 ml       General appearance: alert, cooperative and no distress Heart: regular rate and rhythm Lungs: minor crackles in Left base Abdomen: soft, non-tender; bowel sounds normal; no masses,  no organomegaly Extremities: trace edema Wound: incisions healing well without signs of infection  Lab Results: No results found for this basename: NA:2,K:2,CL:2,CO2:2,GLUCOSE:2,BUN:2,CREATININE:2,CALCIUM:2,MG:2,PHOS:2 in the last 72 hours No results found for this basename: AST:2,ALT:2,ALKPHOS:2,BILITOT:2,PROT:2,ALBUMIN:2 in the last 72 hours No results found for this basename: LIPASE:2,AMYLASE:2 in the last 72 hours No results found for this basename: WBC:2,NEUTROABS:2,HGB:2,HCT:2,MCV:2,PLT:2 in the last 72 hours No results found for this basename: CKTOTAL:4,CKMB:4,TROPONINI:4 in the last 72 hours No results found for this basename: POCBNP:3 in the last 72 hours No results found for this basename: DDIMER in the last 72 hours No results found for this basename: HGBA1C in the last 72 hours No results found for this basename: CHOL,HDL,LDLCALC,TRIG,CHOLHDL in the last 72 hours No results found for this basename: TSH,T4TOTAL,FREET3,T3FREE,THYROIDAB in the last 72 hours No results found for this basename: VITAMINB12,FOLATE,FERRITIN,TIBC,IRON,RETICCTPCT in the last 72  hours  Medications: Scheduled    . aspirin EC  325 mg Oral Daily  . carvedilol  6.25 mg Oral BID WC  . clopidogrel  75 mg Oral Q breakfast  . docusate sodium  200 mg Oral Daily  . enoxaparin  30 mg Subcutaneous Daily  . escitalopram  10 mg Oral Daily  . furosemide  40 mg Oral Daily  . glimepiride  4 mg Oral Q breakfast  . insulin aspart  0-24 Units Subcutaneous BID  . insulin glargine  8 Units Subcutaneous BID  . lisinopril  40 mg Oral Daily  . loratadine  10 mg Oral Daily  . metFORMIN  500 mg Oral BID WC  . pantoprazole  40 mg Oral QAC breakfast  . polysaccharide iron  150 mg Oral Daily  . potassium chloride  20 mEq Oral Daily  . povidone-iodine  1 application Topical BID  . rosuvastatin  40 mg Oral Daily  . sodium chloride  3 mL Intravenous Q12H  . varenicline  1 mg Oral Daily     Radiology/Studies:  No results found.  INR: Will add last result for INR, ABG once components are confirmed Will add last 4 CBG results once components are confirmed  Assessment/Plan: S/P Procedure(s) (LRB): CORONARY ARTERY BYPASS GRAFTING (CABG) (N/A) 1. She conts to progress nicely and is stable for discharge. 2. Sugars are better controlled. 3. Off O2   LOS: 9 days    GOLD,WAYNE E 11/24/20128:01 AM   Agree with above. Ready for discharge

## 2010-12-03 NOTE — Progress Notes (Signed)
Case Management:   [place] to make aware of pt. Discharge today. Llana Aliment, RN, BSN Case Manager [phone]

## 2010-12-03 NOTE — Progress Notes (Signed)
Dc'd pt home with daughter. RN dc'd iv with catheter intact. RN reviewed dc instructions and med rec with pt and dtr. Pt and dtr VU of follow up appointments with MD. c boyd, rn

## 2010-12-03 NOTE — Progress Notes (Signed)
dc'd pacing wires and ct sutures per hosptial policy. Wires intact and pt tolerated well. Instructed patient to stay in bed for 1 hour or until 0945. c boyd, rn

## 2010-12-08 ENCOUNTER — Encounter: Payer: Medicare Other | Admitting: Vascular Surgery

## 2010-12-08 ENCOUNTER — Other Ambulatory Visit: Payer: Medicare Other

## 2010-12-16 ENCOUNTER — Encounter (HOSPITAL_COMMUNITY): Payer: Self-pay | Admitting: Cardiothoracic Surgery

## 2010-12-22 ENCOUNTER — Other Ambulatory Visit: Payer: Self-pay | Admitting: Cardiothoracic Surgery

## 2010-12-22 DIAGNOSIS — I2510 Atherosclerotic heart disease of native coronary artery without angina pectoris: Secondary | ICD-10-CM

## 2010-12-23 DIAGNOSIS — Z951 Presence of aortocoronary bypass graft: Secondary | ICD-10-CM | POA: Insufficient documentation

## 2010-12-26 ENCOUNTER — Ambulatory Visit (INDEPENDENT_AMBULATORY_CARE_PROVIDER_SITE_OTHER): Payer: Self-pay | Admitting: Physician Assistant

## 2010-12-26 ENCOUNTER — Ambulatory Visit
Admission: RE | Admit: 2010-12-26 | Discharge: 2010-12-26 | Disposition: A | Discharge status: Home / Self Care | Payer: Medicare Other | Source: Ambulatory Visit | Attending: Cardiothoracic Surgery | Admitting: Cardiothoracic Surgery

## 2010-12-26 VITALS — BP 144/67 | HR 67 | Resp 20 | Ht 67.0 in | Wt 212.0 lb

## 2010-12-26 DIAGNOSIS — I2510 Atherosclerotic heart disease of native coronary artery without angina pectoris: Secondary | ICD-10-CM

## 2010-12-26 DIAGNOSIS — Z951 Presence of aortocoronary bypass graft: Secondary | ICD-10-CM

## 2010-12-26 NOTE — Progress Notes (Signed)
HPI: Patient returns for routine postoperative follow-up having undergone coronary artery bypass graft x4 on 11/24/2010 by Dr. Prescott Gum.  The patient's early postoperative recovery while in the hospital was notable for anemia which did not require transfusion. Her hospitalization was otherwise uneventful and she was discharged home on 12/03/2010. Since hospital discharge the patient reports feeling well. She is walking daily and her stamina is slowly returning. Her appetite is good and she is having little pain and no shortness of breath or swelling. She has an appointment to see Dr. Fletcher Anon in the next 2 weeks.   Current Outpatient Prescriptions  Medication Sig Dispense Refill  . atorvastatin (LIPITOR) 80 MG tablet TAKE ONE TABLET BY MOUTH AT BEDTIME EMERGENCY REFILL FAXED DR  30 tablet  6  . carvedilol (COREG) 12.5 MG tablet Take 0.5 tablets (6.25 mg total) by mouth 2 (two) times daily with a meal.  60 tablet  1  . clopidogrel (PLAVIX) 75 MG tablet Take 1 tablet (75 mg total) by mouth daily.  30 tablet  11  . escitalopram (LEXAPRO) 10 MG tablet Take 10 mg by mouth daily.        [name] glimepiride (AMARYL) 4 MG tablet Take 4 mg by mouth 2 (two) times daily.        . insulin glargine (LANTUS) 100 UNIT/ML injection Inject 15-35 Units into the skin 2 (two) times daily. 15 units in the morning 35 units at night      . insulin lispro (HUMALOG) 100 UNIT/ML injection Inject 10 Units into the skin 3 (three) times daily before meals. Sliding scale      . Insulin Pen Needle (NOVOFINE 31) 31G X 6 MM MISC by Does not apply route.        [name] lisinopril (PRINIVIL,ZESTRIL) 40 MG tablet Take 40 mg by mouth daily.        . metFORMIN (GLUCOPHAGE) 500 MG tablet Take 500 mg by mouth 2 (two) times daily with a meal.        . pantoprazole (PROTONIX) 40 MG tablet Take 1 tablet by mouth Daily.      . polysaccharide iron (NIFEREX) 150 MG CAPS capsule Take 1 capsule (150 mg total) by mouth daily. For one month then may stop.  30  each  0  . furosemide (LASIX) 40 MG tablet Take 1 tablet (40 mg total) by mouth daily. For five days then stop.  5 tablet  0  . potassium chloride SA (K-DUR,KLOR-CON) 20 MEQ tablet Take 1 tablet (20 mEq total) by mouth daily. For five days then stop.  5 tablet  0    Physical Exam: BP 144/67 Pulse 67 Respirations 20 Sternal wound has healed well in sternum is stable to palpation. Bilateral EVH sites have healed well. Heart regular rate and rhythm without murmurs rubs or gallops. Lungs clear to auscultation. Extremities no edema.  Diagnostic Tests: Chest x-ray shows resolved bilateral pleural effusions  Impression: The patient is doing well status post CABG.  Plan: She plans to begin cardiac rehabilitation phase II. At this point she may begin driving and increasing her activity as tolerated. She will followup as directed with Dr. Fletcher Anon.  We will see her back on a when necessary basis.

## 2010-12-26 NOTE — Progress Notes (Deleted)
  HPI: Patient returns for routine postoperative follow-up having undergone *** on ***. The patient's early postoperative recovery while in the hospital was notable for ***. Since hospital discharge the patient reports ***.   Current Outpatient Prescriptions  Medication Sig Dispense Refill  . atorvastatin (LIPITOR) 80 MG tablet TAKE ONE TABLET BY MOUTH AT BEDTIME EMERGENCY REFILL FAXED DR  30 tablet  6  . carvedilol (COREG) 12.5 MG tablet Take 0.5 tablets (6.25 mg total) by mouth 2 (two) times daily with a meal.  60 tablet  1  . clopidogrel (PLAVIX) 75 MG tablet Take 1 tablet (75 mg total) by mouth daily.  30 tablet  11  . escitalopram (LEXAPRO) 10 MG tablet Take 10 mg by mouth daily.        [name] glimepiride (AMARYL) 4 MG tablet Take 4 mg by mouth 2 (two) times daily.        . insulin glargine (LANTUS) 100 UNIT/ML injection Inject 15-35 Units into the skin 2 (two) times daily. 15 units in the morning 35 units at night      . insulin lispro (HUMALOG) 100 UNIT/ML injection Inject 10 Units into the skin 3 (three) times daily before meals. Sliding scale      . Insulin Pen Needle (NOVOFINE 31) 31G X 6 MM MISC by Does not apply route.        [name] lisinopril (PRINIVIL,ZESTRIL) 40 MG tablet Take 40 mg by mouth daily.        . metFORMIN (GLUCOPHAGE) 500 MG tablet Take 500 mg by mouth 2 (two) times daily with a meal.        . pantoprazole (PROTONIX) 40 MG tablet Take 1 tablet by mouth Daily.      . polysaccharide iron (NIFEREX) 150 MG CAPS capsule Take 1 capsule (150 mg total) by mouth daily. For one month then may stop.  30 each  0  . furosemide (LASIX) 40 MG tablet Take 1 tablet (40 mg total) by mouth daily. For five days then stop.  5 tablet  0  . potassium chloride SA (K-DUR,KLOR-CON) 20 MEQ tablet Take 1 tablet (20 mEq total) by mouth daily. For five days then stop.  5 tablet  0    Physical Exam: ***  Diagnostic Tests: ***  Impression: ***  Plan: ***

## 2010-12-26 NOTE — Patient Instructions (Signed)
May drive and increase activity as tolerated May proceed with Cardiac Rehab

## 2010-12-28 ENCOUNTER — Other Ambulatory Visit: Payer: Self-pay | Admitting: Physician Assistant

## 2011-01-13 ENCOUNTER — Encounter: Payer: Self-pay | Admitting: Cardiology

## 2011-01-13 ENCOUNTER — Ambulatory Visit (INDEPENDENT_AMBULATORY_CARE_PROVIDER_SITE_OTHER): Payer: Medicare Other | Admitting: Physician Assistant

## 2011-01-13 VITALS — BP 190/72 | HR 67 | Ht 67.0 in | Wt 210.0 lb

## 2011-01-13 DIAGNOSIS — R0602 Shortness of breath: Secondary | ICD-10-CM

## 2011-01-13 DIAGNOSIS — E1165 Type 2 diabetes mellitus with hyperglycemia: Secondary | ICD-10-CM | POA: Insufficient documentation

## 2011-01-13 DIAGNOSIS — I5022 Chronic systolic (congestive) heart failure: Secondary | ICD-10-CM

## 2011-01-13 DIAGNOSIS — E119 Type 2 diabetes mellitus without complications: Secondary | ICD-10-CM

## 2011-01-13 DIAGNOSIS — E782 Mixed hyperlipidemia: Secondary | ICD-10-CM

## 2011-01-13 DIAGNOSIS — I2510 Atherosclerotic heart disease of native coronary artery without angina pectoris: Secondary | ICD-10-CM

## 2011-01-13 DIAGNOSIS — I10 Essential (primary) hypertension: Secondary | ICD-10-CM

## 2011-01-13 MED ORDER — ASPIRIN EC 81 MG PO TBEC: 81.0000 mg | DELAYED_RELEASE_TABLET | Freq: Every day | ORAL | Status: AC

## 2011-01-13 MED ORDER — OXYCODONE-ACETAMINOPHEN 5-325 MG PO TABS: 1.0000 | ORAL_TABLET | ORAL | Status: AC | PRN

## 2011-01-13 MED ORDER — ASPIRIN 325 MG PO TBEC: 325.0000 mg | DELAYED_RELEASE_TABLET | Freq: Every day | ORAL | Status: DC

## 2011-01-13 MED ORDER — AMLODIPINE BESYLATE 5 MG PO TABS: 5.0000 mg | ORAL_TABLET | Freq: Every day | ORAL | Status: DC

## 2011-01-13 NOTE — Assessment & Plan Note (Signed)
Followed by Dr. Woody Seller

## 2011-01-13 NOTE — Patient Instructions (Addendum)
Follow up as scheduled. Start Norvasc (amlodipine) 5 mg daily. Start Aspirin 325 mg daily.  Your physician recommends that you go to the [facility] for lab work: CMET/CBC/BNP  Oxycodone/ASAP 5/325 mg 1-2 tablets by mouth every 4-6 hours as needed for pain. Do not drive while taking this medication.

## 2011-01-13 NOTE — Assessment & Plan Note (Addendum)
Stable on current medication regimen. Of note, we'll resume full dose aspirin, but at 81 mg daily, given that she is on Plavix, and are which preceded her recent CABG. We'll also provide short course of oxycodone, for ongoing incisional pain. As noted, patient is to initiate cardiac rehabilitation.

## 2011-01-13 NOTE — Progress Notes (Signed)
Patient seen and examined. Discussed with Mr. [name] [name] and reviewed interval history since my last encounter. She is doing well postoperatively, had a recent visit with [facility]. She still has post surgical thoracic discomfort although it seems to be slowly improving. She does plan to initiate cardiac rehabilitation soon. Mr. [name] [name] and I reviewed the patient's medications in detail with adjustments made.  On examination blood pressure elevated at 180/75, heart rate 65. Chest wall is stable, regular rate and rhythm with 2/6 systolic murmur, no S3, lungs clear.  She is being placed on aspirin 81 mg daily in addition to Plavix. Provide a short course of p.r.n. Oxycodone for postsurgical pain. She is to enroll in cardiac rehabilitation. Other medication adjustments including addition of Norvasc, as per Mr. Joanne Chars note.

## 2011-01-13 NOTE — Progress Notes (Signed)
HPI: Patient presents for postoperative followup, after undergoing 4 vessel CABG, by Dr Tharon Aquas Trigt, on 11/24/2010. This was following referral by Dr. Fletcher Anon to Dr. Prescott Gum, to consider patient for elective revascularization surgery.  Clinically, patient reports symptomatic improvement in exertional SOB. She also has not had any recurrent exertional CP. She does have mid chest soreness, exacerbated by deep inspiration or cough; however, this is also improving. She denies symptoms suggestive of decompensated heart failure, although her weight is up 6 pounds, since last seen here in the clinic in October. Of note, she is no longer on Aldactone.  Patient is slated to initiate cardiac rehabilitation.  Allergies  Allergen Reactions  . Other Shortness Of Breath and Rash    All berries   . Strawberry Hives, Shortness Of Breath and Rash  . Sulfa Antibiotics Swelling    Current Outpatient Prescriptions  Medication Sig Dispense Refill  . atorvastatin (LIPITOR) 80 MG tablet TAKE ONE TABLET BY MOUTH AT BEDTIME EMERGENCY REFILL FAXED DR  30 tablet  6  . carvedilol (COREG) 12.5 MG tablet Take 0.5 tablets (6.25 mg total) by mouth 2 (two) times daily with a meal.  60 tablet  1  . clopidogrel (PLAVIX) 75 MG tablet Take 1 tablet (75 mg total) by mouth daily.  30 tablet  11  . escitalopram (LEXAPRO) 10 MG tablet Take 10 mg by mouth daily.        [name] glimepiride (AMARYL) 4 MG tablet Take 4 mg by mouth 2 (two) times daily.        . insulin glargine (LANTUS) 100 UNIT/ML injection Inject 15-35 Units into the skin 2 (two) times daily. 15 units in the morning 35 units at night      . insulin lispro (HUMALOG) 100 UNIT/ML injection Inject 10 Units into the skin 3 (three) times daily before meals. Sliding scale      . Insulin Pen Needle (NOVOFINE 31) 31G X 6 MM MISC by Does not apply route.        [name] lisinopril (PRINIVIL,ZESTRIL) 40 MG tablet Take 40 mg by mouth daily.        . metFORMIN (GLUCOPHAGE) 500 MG tablet  Take 500 mg by mouth 2 (two) times daily with a meal.        . oxyCODONE (OXY IR/ROXICODONE) 5 MG immediate release tablet Take 5-10 mg by mouth every 4 (four) hours as needed.        . pantoprazole (PROTONIX) 40 MG tablet Take 1 tablet by mouth Daily.        Past Medical History  Diagnosis Date  . Coronary atherosclerosis of native coronary artery     Multivessel  . Chronic diastolic heart failure   . Mixed hyperlipidemia   . Essential hypertension, benign   . Depression   . Cellulitis     Recurrent, bilateral legs  . Arthritis   . Carotid artery disease   . PAD (peripheral artery disease)   . Chronic systolic congestive heart failure     EF 35-40% due to ischemic cardiomyopathy  . Type 2 diabetes mellitus   . Suicide attempt 08/2002  . TIA (transient ischemic attack)   . TMJ syndrome   . Stroke 08/2009  . NSTEMI (non-ST elevated myocardial infarction) 10/2010  . Herniated disc   . Coronary atherosclerosis of native coronary artery     Multivessel    History   Social History  . Marital Status: Widowed    Spouse Name: N/A  Number of Children: N/A  . Years of Education: N/A   Occupational History  . Not on file.   Social History Main Topics  . Smoking status: Former Smoker -- 1.0 packs/day for 50 years    Types: Cigarettes    Quit date: 10/09/2009  . Smokeless tobacco: Never Used   Comment: SMOKED FOR 50 YEARS  . Alcohol Use: Yes     Occasional  . Drug Use: No  . Sexually Active: Not on file   Other Topics Concern  . Not on file   Social History Narrative  . No narrative on file    Family History  Problem Relation Age of Onset  . Coronary artery disease Father     Died with MI age 62  . Diabetes type II Mother   . Hypertension Mother   . Heart failure Sister   . Cancer Brother     Lung cancer    ROS: no nausea, vomiting; no fever, chills; no melena, hematochezia; no claudication  PHYSICAL EXAM:  BP 180/75  Pulse 65  Ht _0  (1.702 m)  Wt  210 lb (95.255 kg)  BMI 32.89 kg/m2 GENERAL: 66 year old female, obese, sitting upright; NAD HEENT: NCAT, PERRLA, EOMI; sclera clear; no xanthelasma NECK: palpable bilateral carotid pulses, bilateral bruits (L > R); no JVD; no TM LUNGS: CTA bilaterally CARDIAC: RRR (S1, S2); soft, 2/6 systolic ejection murmur; no rubs or gallops ABDOMEN: Protuberant EXTREMETIES: intact distal pulses; no significant peripheral edema SKIN: warm/dry; no obvious rash/lesions MUSCULOSKELETAL: no joint deformity NEURO: no focal deficit; NL affect   EKG: reviewed and available in Electronic Records   ASSESSMENT & PLAN:

## 2011-01-13 NOTE — Assessment & Plan Note (Signed)
Patient was recently on Aldactone, prior to undergoing CABG. We'll consider resuming this, following complete lab work. 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

## 2011-01-13 NOTE — Assessment & Plan Note (Signed)
Uncontrolled on current Rx. Will add Norvasc 5 mg daily, and reassess at next OV in 2 months.

## 2011-01-13 NOTE — Assessment & Plan Note (Addendum)
Well-controlled on high dose Lipitor, with LDL 64, back in October.

## 2011-01-15 NOTE — Discharge Summary (Signed)
I agree with the dictated discharge summary

## 2011-03-30 ENCOUNTER — Encounter: Payer: Self-pay | Admitting: Cardiology

## 2011-03-30 ENCOUNTER — Ambulatory Visit (INDEPENDENT_AMBULATORY_CARE_PROVIDER_SITE_OTHER): Payer: Medicare Other | Admitting: Cardiology

## 2011-03-30 VITALS — BP 158/90 | HR 57 | Ht 67.0 in | Wt 201.4 lb

## 2011-03-30 DIAGNOSIS — I2510 Atherosclerotic heart disease of native coronary artery without angina pectoris: Secondary | ICD-10-CM

## 2011-03-30 DIAGNOSIS — Z79899 Other long term (current) drug therapy: Secondary | ICD-10-CM

## 2011-03-30 DIAGNOSIS — E782 Mixed hyperlipidemia: Secondary | ICD-10-CM

## 2011-03-30 DIAGNOSIS — I5022 Chronic systolic (congestive) heart failure: Secondary | ICD-10-CM

## 2011-03-30 DIAGNOSIS — I10 Essential (primary) hypertension: Secondary | ICD-10-CM

## 2011-03-30 NOTE — Assessment & Plan Note (Signed)
Echocardiogram from November of last year revealed LVEF of 55%, was noted to be 40-45% by TEE. She seems to be generally euvolemic. Her weight is down approximately 10 pounds from last visit.

## 2011-03-30 NOTE — Assessment & Plan Note (Signed)
Symptomatically stable on medical therapy. Will touch base with cardiac rehabilitation. No changes to current regimen.

## 2011-03-30 NOTE — Assessment & Plan Note (Signed)
Blood pressure trend is better, not optimal. Continue present medications. Followup BMET.

## 2011-03-30 NOTE — Progress Notes (Signed)
Clinical Summary [name] [name] is a 66 y.o.female presenting for followup. She was seen back in January following CABG in November 2012. Adjustments were made in her medical regimen at that time. She did not followup for lab work.  She reports no angina, stable dyspnea on exertion. Has mild dependent edema that has not progressed. She reports no palpitations or syncope. States that she has been compliant with her medications. She states that she has not heard back from cardiac rehabilitation. On her own, she has been walking up to a mile at a time slowly, at least 3 days a week.  Blood pressure is generally better than at the last visit, not optimal as yet.   Allergies  Allergen Reactions  . Other Shortness Of Breath and Rash    All berries   . Strawberry Hives, Shortness Of Breath and Rash  . Sulfa Antibiotics Swelling    Current Outpatient Prescriptions  Medication Sig Dispense Refill  . amLODipine (NORVASC) 5 MG tablet Take 1 tablet (5 mg total) by mouth daily.  30 tablet  6  . aspirin EC 81 MG tablet Take 1 tablet (81 mg total) by mouth daily.  150 tablet  2  . atorvastatin (LIPITOR) 80 MG tablet TAKE ONE TABLET BY MOUTH AT BEDTIME EMERGENCY REFILL FAXED DR  30 tablet  6  . carvedilol (COREG) 12.5 MG tablet Take 0.5 tablets (6.25 mg total) by mouth 2 (two) times daily with a meal.  60 tablet  1  . clopidogrel (PLAVIX) 75 MG tablet Take 1 tablet (75 mg total) by mouth daily.  30 tablet  11  . escitalopram (LEXAPRO) 10 MG tablet Take 10 mg by mouth daily.        [name] glimepiride (AMARYL) 4 MG tablet Take 4 mg by mouth 2 (two) times daily.        . insulin glargine (LANTUS) 100 UNIT/ML injection Inject 15-35 Units into the skin 2 (two) times daily. 15 units in the morning 35 units at night      . insulin lispro (HUMALOG) 100 UNIT/ML injection Inject 10 Units into the skin 3 (three) times daily before meals. Sliding scale      . Insulin Pen Needle (NOVOFINE 31) 31G X 6 MM MISC by Does not  apply route.        [name] lisinopril (PRINIVIL,ZESTRIL) 40 MG tablet Take 40 mg by mouth daily.        . metFORMIN (GLUCOPHAGE) 500 MG tablet Take 500 mg by mouth 2 (two) times daily with a meal.        . pantoprazole (PROTONIX) 40 MG tablet Take 1 tablet by mouth Daily.      . nitroGLYCERIN (NITROSTAT) 0.4 MG SL tablet Place 0.4 mg under the tongue every 5 (five) minutes as needed.        Past Medical History  Diagnosis Date  . Coronary atherosclerosis of native coronary artery     4v CABG: L-LAD; S-PL; S-OM; S-DX, by PVT, 11/12  . Chronic diastolic heart failure   . Mixed hyperlipidemia   . Essential hypertension, benign   . Depression   . Cellulitis     Recurrent, bilateral legs  . Arthritis   . Carotid artery disease     Bilateral CEA; 50-60% bilateral ICA stenosis, 11/12  . PAD (peripheral artery disease)   . Chronic systolic congestive heart failure     EF 35-40% due to ischemic cardiomyopathy  . Type 2 diabetes mellitus   . Suicide  attempt 08/2002  . TIA (transient ischemic attack)   . TMJ syndrome   . Stroke 08/2009  . NSTEMI (non-ST elevated myocardial infarction) 10/2010  . Herniated disc     Past Surgical History  Procedure Date  . Appendectomy   . Cholecystectomy   . Abdominal hysterectomy   . Carotid endarterectomy     Bilateral - Dr. Kellie Simmering  . Left wrist bone graft   . Coronary artery bypass graft 11/24/2010    Procedure: CORONARY ARTERY BYPASS GRAFTING (CABG);  Surgeon: Tharon Aquas Adelene Idler, MD;  Location: [facility];  Service: Open Heart Surgery;  Laterality: N/A;  Coronary Artery Bypass Graft times four on pump utilizing left internal mammary artery and bilateral greater saphenous veins harvested endoscopically, transesophageal echocardiogram     Family History  Problem Relation Age of Onset  . Coronary artery disease Father     Died with MI age 40  . Diabetes type II Mother   . Hypertension Mother   . Heart failure Sister   . Cancer Brother     Lung cancer     Social History Ms. Chappelle reports that she quit smoking about 17 months ago. Her smoking use included Cigarettes. She has a 50 pack-year smoking history. She has never used smokeless tobacco. Ms. Ruud reports that she drinks alcohol.  Review of Systems Trouble sleeping, plans to try an over-the-counter sleep aid. No reported bleeding problems. Stable appetite. Otherwise negative.  Physical Examination Filed Vitals:   03/30/11 1046  BP: 158/90  Pulse: 57   Overweight woman in no acute distress. HEENT: Conjunctiva and lids normal, oropharynx clear. Neck: Supple, no elevated JVP, bilateral CEA scars, no thyromegaly. Lungs: Clear to auscultation, nonlabored breathing at rest. Thorax: Well-healed midline sternal incision. Cardiac: Regular rate and rhythm, no S3, soft systolic murmur, no pericardial rub. Abdomen: Soft, nontender, bowel sounds present, no guarding or rebound. Extremities:Trace to 1+ edema, mild stasis, distal pulses 1-2+. Skin: Warm and dry. Musculoskeletal: No kyphosis. Neuropsychiatric: Alert and oriented x3, affect grossly appropriate.      Problem List and Plan

## 2011-03-30 NOTE — Patient Instructions (Signed)
Follow up in 3 months. Your physician recommends that you go to the [facility] for lab work: BMET/FLP/LFT--Do not eat or drink after midnight.  Your physician recommends that you continue on your current medications as directed. Please refer to the Current Medication list given to you today. If the results of your test are normal or stable, you will receive a letter. If they are abnormal, the nurse will contact you by phone.

## 2011-03-30 NOTE — Assessment & Plan Note (Signed)
Due for followup LFT and FLP. These will be arranged.

## 2011-04-04 ENCOUNTER — Telehealth: Payer: Self-pay | Admitting: *Deleted

## 2011-04-04 NOTE — Telephone Encounter (Signed)
Left message to call back on voicemail regarding results.

## 2011-04-04 NOTE — Telephone Encounter (Signed)
Message copied by Marcille Buffy on Tue Apr 04, 2011  3:15 PM ------      Message from: Satira Sark      Created: Tue Apr 04, 2011 10:38 AM       Reviewed. AST and ALT are normal. Creatinine normal at 0.9. Cholesterol numbers are quite low, total cholesterol 88, LDL 46. Suggest that she decrease her Lipitor to 20 mg daily when current prescription complete.

## 2011-04-06 ENCOUNTER — Encounter: Payer: Self-pay | Admitting: *Deleted

## 2011-04-06 MED ORDER — ATORVASTATIN CALCIUM 20 MG PO TABS: 20.0000 mg | ORAL_TABLET | Freq: Every day | ORAL | Status: DC

## 2011-04-06 NOTE — Telephone Encounter (Signed)
    This encounter was created in error - please disregard.

## 2011-04-06 NOTE — Telephone Encounter (Signed)
Pt left message at 1321 today returning call.  Spoke with pt. Pt notified of results and verbalized understanding.

## 2011-04-14 ENCOUNTER — Other Ambulatory Visit: Payer: Self-pay | Admitting: Cardiovascular Disease

## 2011-04-14 ENCOUNTER — Other Ambulatory Visit: Payer: Self-pay | Admitting: Physician Assistant

## 2011-06-15 ENCOUNTER — Other Ambulatory Visit: Payer: Self-pay | Admitting: Cardiovascular Disease

## 2011-06-15 ENCOUNTER — Other Ambulatory Visit: Payer: Self-pay | Admitting: *Deleted

## 2011-06-15 MED ORDER — LISINOPRIL 40 MG PO TABS: 40.0000 mg | ORAL_TABLET | Freq: Every day | ORAL | Status: DC

## 2011-06-15 NOTE — Telephone Encounter (Signed)
..  Requested Prescriptions   Pending Prescriptions Disp Refills  . clopidogrel (PLAVIX) 75 MG tablet 30 tablet 4    Sig: Take 1 tablet (75 mg total) by mouth daily.

## 2011-07-03 ENCOUNTER — Ambulatory Visit: Payer: Medicare Other | Admitting: Cardiology

## 2011-11-14 ENCOUNTER — Encounter: Payer: Self-pay | Admitting: Vascular Surgery

## 2011-11-15 ENCOUNTER — Telehealth: Payer: Self-pay | Admitting: Cardiology

## 2011-11-15 ENCOUNTER — Other Ambulatory Visit: Payer: Self-pay | Admitting: Cardiology

## 2011-11-15 MED ORDER — AMLODIPINE BESYLATE 5 MG PO TABS: 5.0000 mg | ORAL_TABLET | Freq: Every day | ORAL | Status: DC

## 2011-11-15 NOTE — Telephone Encounter (Signed)
Called pharmacy and requested that they send refill request for portonix to Primary care doctor.

## 2011-12-13 ENCOUNTER — Other Ambulatory Visit: Payer: Self-pay | Admitting: Cardiology

## 2011-12-13 MED ORDER — PANTOPRAZOLE SODIUM 40 MG PO TBEC: 40.0000 mg | DELAYED_RELEASE_TABLET | Freq: Every day | ORAL | Status: DC

## 2011-12-13 MED ORDER — CLOPIDOGREL BISULFATE 75 MG PO TABS: 75.0000 mg | ORAL_TABLET | Freq: Every day | ORAL | Status: DC

## 2012-01-15 ENCOUNTER — Other Ambulatory Visit: Payer: Self-pay | Admitting: *Deleted

## 2012-01-15 MED ORDER — AMLODIPINE BESYLATE 5 MG PO TABS: 5.0000 mg | ORAL_TABLET | Freq: Every day | ORAL | Status: DC

## 2012-02-14 ENCOUNTER — Other Ambulatory Visit: Payer: Self-pay | Admitting: *Deleted

## 2012-02-14 MED ORDER — LISINOPRIL 40 MG PO TABS: 40.0000 mg | ORAL_TABLET | Freq: Every day | ORAL | Status: DC

## 2012-02-14 MED ORDER — AMLODIPINE BESYLATE 5 MG PO TABS: 5.0000 mg | ORAL_TABLET | Freq: Every day | ORAL | Status: DC

## 2012-03-14 ENCOUNTER — Other Ambulatory Visit: Payer: Self-pay | Admitting: Cardiology

## 2012-03-14 MED ORDER — LISINOPRIL 40 MG PO TABS: 40.0000 mg | ORAL_TABLET | Freq: Every day | ORAL | Status: DC

## 2012-03-14 MED ORDER — AMLODIPINE BESYLATE 5 MG PO TABS: 5.0000 mg | ORAL_TABLET | Freq: Every day | ORAL | Status: DC

## 2012-04-12 ENCOUNTER — Other Ambulatory Visit: Payer: Self-pay | Admitting: Cardiology

## 2012-04-12 ENCOUNTER — Other Ambulatory Visit: Payer: Self-pay | Admitting: Physician Assistant

## 2012-12-02 IMAGING — CR DG CHEST 1V PORT
1 series · 1 of 1 positions shown · non-contrast
Comparison: 11/24/2010

CLINICAL DATA: Left apical pneumothorax.

PORTABLE CHEST - 1 VIEW

[view not recorded]
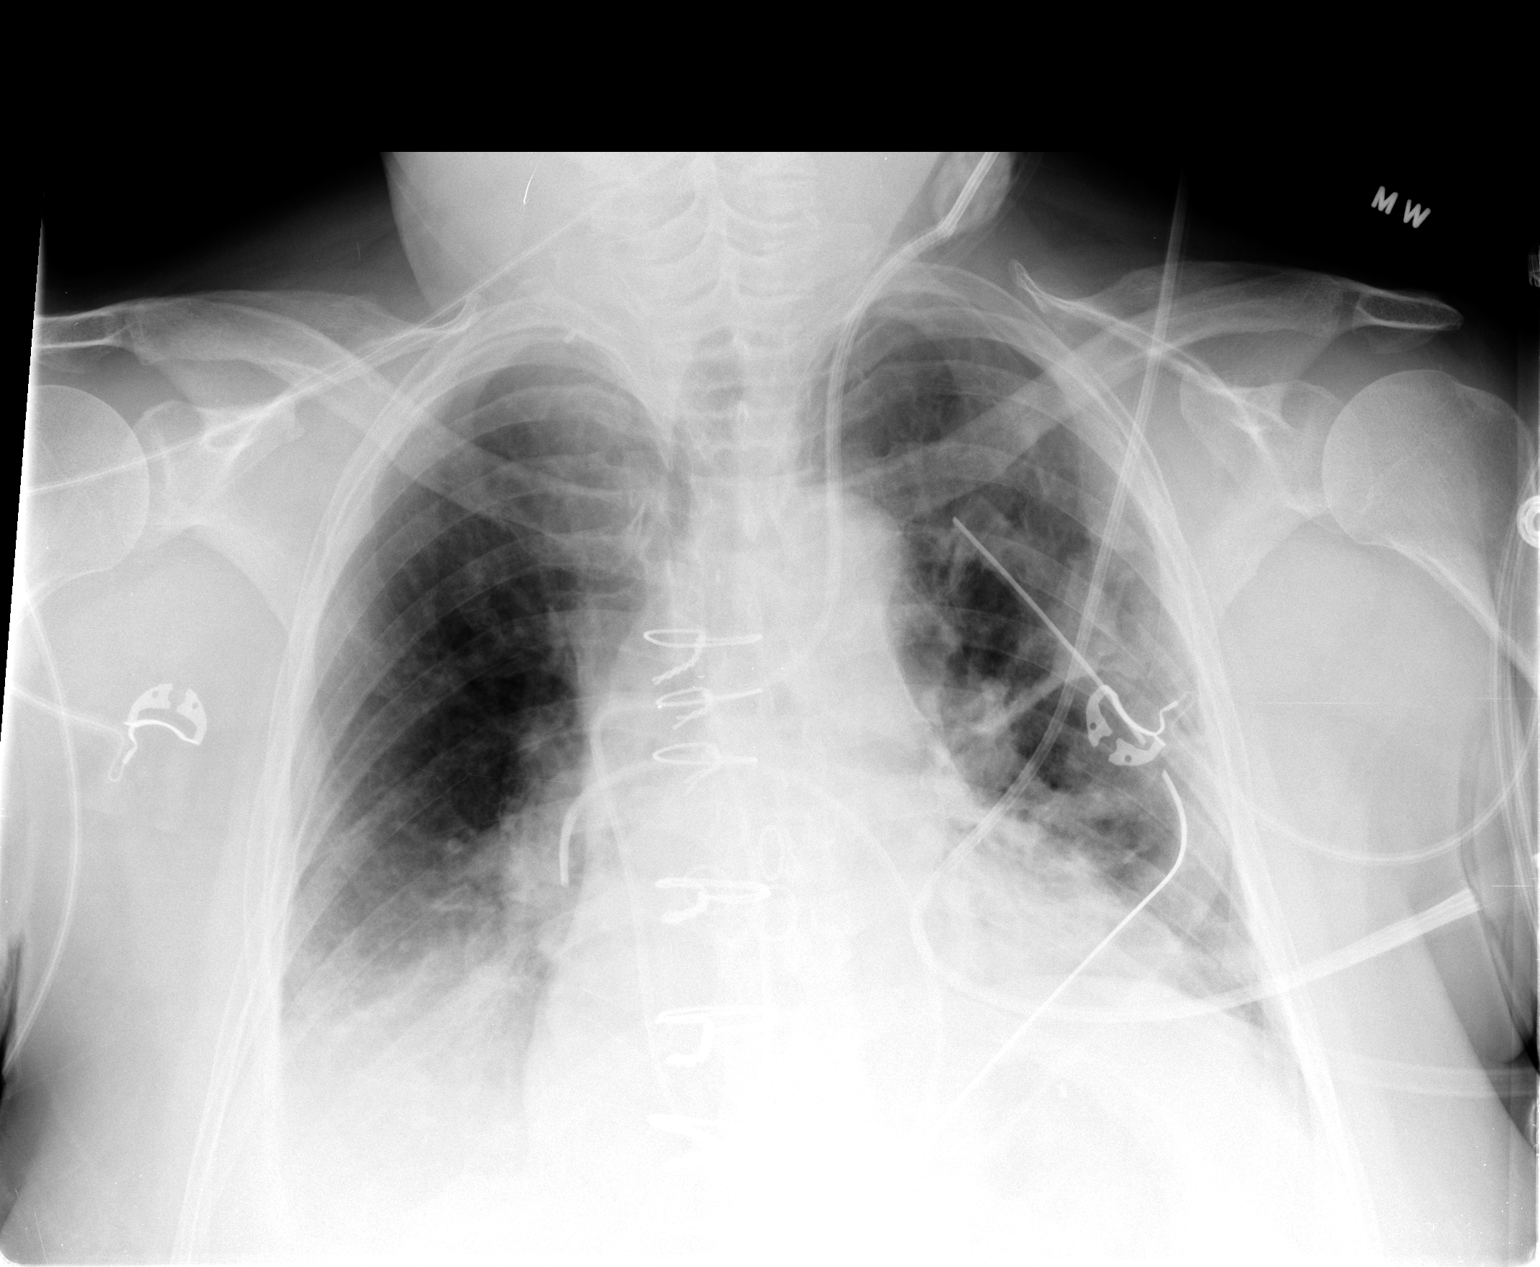

[1 of 1 positions shown; findings below may reference images not displayed]

FINDINGS: Endotracheal tube and NG tube have been removed.  Swan-
Ganz catheter and chest tubes remain in place.  No visible residual
left apical pneumothorax.  Small right effusion.  Vascularity is
normal.  Minimal atelectasis on the left.
IMPRESSION: Small right effusion.  Slight new atelectasis on the left.  No
residual pneumothorax.

## 2013-03-09 HISTORY — PX: CORONARY ANGIOPLASTY WITH STENT PLACEMENT: SHX49

## 2013-03-18 ENCOUNTER — Encounter (HOSPITAL_COMMUNITY): Payer: Self-pay | Admitting: Nurse Practitioner

## 2013-03-18 ENCOUNTER — Inpatient Hospital Stay (HOSPITAL_COMMUNITY)
Admission: AD | Admit: 2013-03-18 | Discharge: 2013-03-29 | DRG: 246 | Disposition: A | Discharge status: Home Health | Payer: Medicare Other | Source: Other Acute Inpatient Hospital | Attending: Internal Medicine | Admitting: Internal Medicine

## 2013-03-18 DIAGNOSIS — N390 Urinary tract infection, site not specified: Secondary | ICD-10-CM | POA: Diagnosis not present

## 2013-03-18 DIAGNOSIS — E876 Hypokalemia: Secondary | ICD-10-CM | POA: Diagnosis not present

## 2013-03-18 DIAGNOSIS — Z951 Presence of aortocoronary bypass graft: Secondary | ICD-10-CM | POA: Diagnosis not present

## 2013-03-18 DIAGNOSIS — Z9119 Patient's noncompliance with other medical treatment and regimen: Secondary | ICD-10-CM | POA: Diagnosis not present

## 2013-03-18 DIAGNOSIS — E782 Mixed hyperlipidemia: Secondary | ICD-10-CM

## 2013-03-18 DIAGNOSIS — J101 Influenza due to other identified influenza virus with other respiratory manifestations: Secondary | ICD-10-CM | POA: Diagnosis present

## 2013-03-18 DIAGNOSIS — I2589 Other forms of chronic ischemic heart disease: Secondary | ICD-10-CM | POA: Diagnosis not present

## 2013-03-18 DIAGNOSIS — I5022 Chronic systolic (congestive) heart failure: Secondary | ICD-10-CM

## 2013-03-18 DIAGNOSIS — Z882 Allergy status to sulfonamides status: Secondary | ICD-10-CM | POA: Diagnosis not present

## 2013-03-18 DIAGNOSIS — IMO0001 Reserved for inherently not codable concepts without codable children: Secondary | ICD-10-CM | POA: Diagnosis present

## 2013-03-18 DIAGNOSIS — Z87891 Personal history of nicotine dependence: Secondary | ICD-10-CM

## 2013-03-18 DIAGNOSIS — Z8673 Personal history of transient ischemic attack (TIA), and cerebral infarction without residual deficits: Secondary | ICD-10-CM | POA: Diagnosis not present

## 2013-03-18 DIAGNOSIS — Z833 Family history of diabetes mellitus: Secondary | ICD-10-CM

## 2013-03-18 DIAGNOSIS — R778 Other specified abnormalities of plasma proteins: Secondary | ICD-10-CM | POA: Diagnosis present

## 2013-03-18 DIAGNOSIS — R599 Enlarged lymph nodes, unspecified: Secondary | ICD-10-CM | POA: Diagnosis not present

## 2013-03-18 DIAGNOSIS — J111 Influenza due to unidentified influenza virus with other respiratory manifestations: Secondary | ICD-10-CM | POA: Diagnosis not present

## 2013-03-18 DIAGNOSIS — R079 Chest pain, unspecified: Secondary | ICD-10-CM | POA: Diagnosis present

## 2013-03-18 DIAGNOSIS — I509 Heart failure, unspecified: Secondary | ICD-10-CM | POA: Diagnosis present

## 2013-03-18 DIAGNOSIS — Z91018 Allergy to other foods: Secondary | ICD-10-CM

## 2013-03-18 DIAGNOSIS — R0902 Hypoxemia: Secondary | ICD-10-CM | POA: Diagnosis present

## 2013-03-18 DIAGNOSIS — I2510 Atherosclerotic heart disease of native coronary artery without angina pectoris: Secondary | ICD-10-CM | POA: Diagnosis not present

## 2013-03-18 DIAGNOSIS — I739 Peripheral vascular disease, unspecified: Secondary | ICD-10-CM | POA: Diagnosis not present

## 2013-03-18 DIAGNOSIS — I10 Essential (primary) hypertension: Secondary | ICD-10-CM | POA: Diagnosis not present

## 2013-03-18 DIAGNOSIS — I779 Disorder of arteries and arterioles, unspecified: Secondary | ICD-10-CM | POA: Diagnosis present

## 2013-03-18 DIAGNOSIS — R0603 Acute respiratory distress: Secondary | ICD-10-CM

## 2013-03-18 DIAGNOSIS — I5043 Acute on chronic combined systolic (congestive) and diastolic (congestive) heart failure: Principal | ICD-10-CM | POA: Diagnosis present

## 2013-03-18 DIAGNOSIS — Z23 Encounter for immunization: Secondary | ICD-10-CM

## 2013-03-18 DIAGNOSIS — E1165 Type 2 diabetes mellitus with hyperglycemia: Secondary | ICD-10-CM | POA: Diagnosis present

## 2013-03-18 DIAGNOSIS — Z801 Family history of malignant neoplasm of trachea, bronchus and lung: Secondary | ICD-10-CM | POA: Diagnosis not present

## 2013-03-18 DIAGNOSIS — I5042 Chronic combined systolic (congestive) and diastolic (congestive) heart failure: Secondary | ICD-10-CM | POA: Diagnosis present

## 2013-03-18 DIAGNOSIS — I369 Nonrheumatic tricuspid valve disorder, unspecified: Secondary | ICD-10-CM

## 2013-03-18 DIAGNOSIS — Z91199 Patient's noncompliance with other medical treatment and regimen due to unspecified reason: Secondary | ICD-10-CM

## 2013-03-18 DIAGNOSIS — Z598 Other problems related to housing and economic circumstances: Secondary | ICD-10-CM | POA: Diagnosis not present

## 2013-03-18 DIAGNOSIS — Z5987 Material hardship due to limited financial resources, not elsewhere classified: Secondary | ICD-10-CM

## 2013-03-18 DIAGNOSIS — IMO0002 Reserved for concepts with insufficient information to code with codable children: Secondary | ICD-10-CM | POA: Diagnosis present

## 2013-03-18 DIAGNOSIS — Z8249 Family history of ischemic heart disease and other diseases of the circulatory system: Secondary | ICD-10-CM | POA: Diagnosis not present

## 2013-03-18 DIAGNOSIS — I214 Non-ST elevation (NSTEMI) myocardial infarction: Secondary | ICD-10-CM | POA: Diagnosis present

## 2013-03-18 DIAGNOSIS — R7989 Other specified abnormal findings of blood chemistry: Secondary | ICD-10-CM | POA: Diagnosis present

## 2013-03-18 HISTORY — DX: Ischemic cardiomyopathy: I25.5

## 2013-03-18 LAB — GLUCOSE, CAPILLARY
Glucose-Capillary: 141 mg/dL — ABNORMAL HIGH (ref 70–99)
Glucose-Capillary: 183 mg/dL — ABNORMAL HIGH (ref 70–99)

## 2013-03-18 LAB — TROPONIN I: Troponin I: <0.3 ng/mL (ref <0.30)

## 2013-03-18 LAB — HEPARIN LEVEL (UNFRACTIONATED): Heparin Unfractionated: 0.57 IU/mL (ref 0.30–0.70)

## 2013-03-18 MED ORDER — CARVEDILOL 3.125 MG PO TABS
3.1250 mg | ORAL_TABLET | Freq: Two times a day (BID) | ORAL | Status: DC
  Administered 2013-03-18 – 2013-03-25 (×14): 3.125 mg via ORAL
  Filled 2013-03-18 (×16): qty 1

## 2013-03-18 MED ORDER — CIPROFLOXACIN HCL 500 MG PO TABS
500.0000 mg | ORAL_TABLET | Freq: Two times a day (BID) | ORAL | Status: AC
Start: 2013-03-18 — End: 2013-03-24
  Administered 2013-03-18 – 2013-03-24 (×13): 500 mg via ORAL
  Filled 2013-03-18 (×14): qty 1

## 2013-03-18 MED ORDER — INSULIN ASPART 100 UNIT/ML ~~LOC~~ SOLN
4.0000 [IU] | Freq: Three times a day (TID) | SUBCUTANEOUS | Status: DC
  Administered 2013-03-18 – 2013-03-24 (×17): 4 [IU] via SUBCUTANEOUS

## 2013-03-18 MED ORDER — ASPIRIN EC 81 MG PO TBEC
81.0000 mg | DELAYED_RELEASE_TABLET | Freq: Every day | ORAL | Status: DC
  Administered 2013-03-19 – 2013-03-29 (×10): 81 mg via ORAL
  Filled 2013-03-18 (×12): qty 1

## 2013-03-18 MED ORDER — FUROSEMIDE 10 MG/ML IJ SOLN
80.0000 mg | Freq: Two times a day (BID) | INTRAMUSCULAR | Status: DC
  Administered 2013-03-18 – 2013-03-23 (×11): 80 mg via INTRAVENOUS
  Filled 2013-03-18 (×15): qty 8

## 2013-03-18 MED ORDER — SODIUM CHLORIDE 0.9 % IJ SOLN
3.0000 mL | Freq: Two times a day (BID) | INTRAMUSCULAR | Status: DC
Start: 2013-03-18 — End: 2013-03-29
  Administered 2013-03-21 – 2013-03-28 (×12): 3 mL via INTRAVENOUS

## 2013-03-18 MED ORDER — HEPARIN (PORCINE) IN NACL 100-0.45 UNIT/ML-% IJ SOLN
1600.0000 [IU]/h | INTRAMUSCULAR | Status: DC
  Administered 2013-03-19: 1250 [IU]/h via INTRAVENOUS
  Administered 2013-03-20: 1450 [IU]/h via INTRAVENOUS
  Administered 2013-03-20: 1250 [IU]/h via INTRAVENOUS
  Administered 2013-03-21 – 2013-03-22 (×2): 1500 [IU]/h via INTRAVENOUS
  Administered 2013-03-22: 1600 [IU]/h via INTRAVENOUS
  Filled 2013-03-18 (×9): qty 250

## 2013-03-18 MED ORDER — CAPTOPRIL 6.25 MG HALF TABLET
6.2500 mg | ORAL_TABLET | Freq: Three times a day (TID) | ORAL | Status: DC
  Administered 2013-03-18 – 2013-03-22 (×14): 6.25 mg via ORAL
  Filled 2013-03-18 (×17): qty 1

## 2013-03-18 MED ORDER — ADULT MULTIVITAMIN W/MINERALS CH
1.0000 | ORAL_TABLET | Freq: Every day | ORAL | Status: DC
  Administered 2013-03-18 – 2013-03-28 (×11): 1 via ORAL
  Filled 2013-03-18 (×12): qty 1

## 2013-03-18 MED ORDER — SODIUM CHLORIDE 0.9 % IV SOLN
250.0000 mL | INTRAVENOUS | Status: DC | PRN
  Administered 2013-03-23: 250 mL via INTRAVENOUS

## 2013-03-18 MED ORDER — SODIUM CHLORIDE 0.9 % IJ SOLN: 3.0000 mL | INTRAMUSCULAR | Status: DC | PRN

## 2013-03-18 MED ORDER — INSULIN ASPART 100 UNIT/ML ~~LOC~~ SOLN
0.0000 [IU] | Freq: Three times a day (TID) | SUBCUTANEOUS | Status: DC
  Administered 2013-03-18: 2 [IU] via SUBCUTANEOUS
  Administered 2013-03-19: 5 [IU] via SUBCUTANEOUS
  Administered 2013-03-19 (×2): 3 [IU] via SUBCUTANEOUS
  Administered 2013-03-20: 5 [IU] via SUBCUTANEOUS
  Administered 2013-03-20: 8 [IU] via SUBCUTANEOUS
  Administered 2013-03-20 – 2013-03-21 (×3): 3 [IU] via SUBCUTANEOUS
  Administered 2013-03-21: 8 [IU] via SUBCUTANEOUS
  Administered 2013-03-22: 3 [IU] via SUBCUTANEOUS
  Administered 2013-03-22: 5 [IU] via SUBCUTANEOUS
  Administered 2013-03-22 – 2013-03-23 (×2): 3 [IU] via SUBCUTANEOUS
  Administered 2013-03-23 (×2): 5 [IU] via SUBCUTANEOUS
  Administered 2013-03-24: 3 [IU] via SUBCUTANEOUS
  Administered 2013-03-24 – 2013-03-25 (×4): 5 [IU] via SUBCUTANEOUS
  Administered 2013-03-25: 2 [IU] via SUBCUTANEOUS
  Administered 2013-03-26: 3 [IU] via SUBCUTANEOUS
  Administered 2013-03-26: 8 [IU] via SUBCUTANEOUS
  Administered 2013-03-26: 3 [IU] via SUBCUTANEOUS
  Administered 2013-03-27 (×2): 2 [IU] via SUBCUTANEOUS
  Administered 2013-03-28: 13:00:00 5 [IU] via SUBCUTANEOUS
  Administered 2013-03-28: 3 [IU] via SUBCUTANEOUS
  Administered 2013-03-28: 19:00:00 5 [IU] via SUBCUTANEOUS
  Administered 2013-03-29: 2 [IU] via SUBCUTANEOUS

## 2013-03-18 MED ORDER — MORPHINE SULFATE 2 MG/ML IJ SOLN
2.0000 mg | INTRAMUSCULAR | Status: DC | PRN
  Administered 2013-03-18 – 2013-03-28 (×31): 2 mg via INTRAVENOUS
  Filled 2013-03-18 (×32): qty 1

## 2013-03-18 NOTE — Progress Notes (Signed)
  Echocardiogram 2D Echocardiogram has been performed.  [name] [name] 03/18/2013, 5:24 PM

## 2013-03-18 NOTE — H&P (Signed)
Patient ID: [name] [name] MRN: [id number], DOB/AGE: 05-27-1945   Admit date: 03/18/2013  Primary Physician: Glenda Chroman., MD Primary Cardiologist: Myles Gip, MD [facility])  Pt. Profile:  68 year old female with prior history of coronary artery disease and ischemic cardiomyopathy status post coronary artery bypass grafting in 2012 who presents on transfer from [facility] with chest pain, acute heart failure, and mild troponin elevation.  Problem List  Past Medical History  Diagnosis Date  . Coronary atherosclerosis of native coronary artery     a. 11/2010 NSTEMI/CABG x 4: L-LAD; S-PL; S-OM; S-DX, by PVT.  [name] Chronic combined systolic and diastolic CHF (congestive heart failure)     a. 11/2010 TEE: EF 40-45%, no veg or LA thrombus.  . Mixed hyperlipidemia   . Essential hypertension, benign   . Depression   . Cellulitis     a. Recurrent, bilateral legs  . Arthritis   . Carotid artery disease     a. Bilateral CEA; 50-60% bilateral ICA stenosis, 11/12  . PAD (peripheral artery disease)   . Type 2 diabetes mellitus   . Suicide attempt 08/2002  . TIA (transient ischemic attack)   . TMJ syndrome   . Stroke 08/2009  . Herniated disc   . Ischemic cardiomyopathy     a. 11/2010 TEE: EF 40-45%.    Past Surgical History  Procedure Laterality Date  . Appendectomy    . Cholecystectomy    . Abdominal hysterectomy    . Carotid endarterectomy      Bilateral - Dr. Kellie Simmering  . Left wrist bone graft    . Coronary artery bypass graft  11/24/2010    Procedure: CORONARY ARTERY BYPASS GRAFTING (CABG);  Surgeon: Tharon Aquas Adelene Idler, MD;  Location: [facility];  Service: Open Heart Surgery;  Laterality: N/A;  Coronary Artery Bypass Graft times four on pump utilizing left internal mammary artery and bilateral greater saphenous veins harvested endoscopically, transesophageal echocardiogram      Allergies  Allergies  Allergen Reactions  . Other Shortness Of Breath and Rash    All berries   .  Strawberry Hives, Shortness Of Breath and Rash  . Sulfa Antibiotics Swelling    HPI  68 year old female with a prior history of coronary artery disease status post coronary artery bypass grafting x4 in November 2012, along with an ischemic cardiopathy and EF of 40-45%. She also has peripheral arterial disease and carotid arterial disease status post bilateral carotid endarterectomies, also in November 2012. She has poorly controlled diabetes mellitus and has been off all medications for at least the past year she says because of cost. She is on Medicaid but says that she can't afford her medicines because of "Obamacare."   Despite her noncompliance, she has done reasonably well over the past few years. Approximately 3-4 weeks ago however, she began to experience progressive dyspnea on exertion eventually leading to dyspnea at rest, orthopnea, lower extremity edema, and increasing abdominal girth. Over the past 3 days, she has also had a constant lower chest and epigastric discomfort that is worse with activity as well as with deep breathing, and palpation. She says that she weighs herself at home on a daily basis and that her weight has not changed any reporting that she weighed about 213 pounds one month ago and was 212 pounds yesterday. Regardless, because of progressive symptoms, she presented to [facility] yesterday where ECG showed no acute ST or T changes. Troponin was mildly elevated at 0.09 and BNP was elevated  at 1469. LFTs were mildly elevated. Chest x-ray suggested pulmonary edema. D-dimer was elevated and a CT angiography chest showed no evidence of pulmonary embolism. Urinalysis suggested UTI. Patient was admitted to the internal medicine service and was seen by cardiology this morning. She did apparently diuresis some and is feeling somewhat better though is very dyspneic even with talking. Cardiology felt that she would benefit from transfer to a larger center and she chose to come to  [facility]. Currently she continues to have mild lower chest and epigastric discomfort that is worse with palpation. She is also experiencing mild dyspnea during our interview.   Home Medications  None  Family History  Family History  Problem Relation Age of Onset  . Coronary artery disease Father     Died with MI age 69  . Diabetes type II Mother   . Hypertension Mother   . Heart failure Sister   . Cancer Brother     Lung cancer   Social History  History   Social History  . Marital Status: Widowed    Spouse Name: N/A    Number of Children: N/A  . Years of Education: N/A   Occupational History  . Not on file.   Social History Main Topics  . Smoking status: Former Smoker -- 1.00 packs/day for 50 years    Types: Cigarettes    Quit date: 10/09/2009  . Smokeless tobacco: Never Used     Comment: SMOKED FOR 50 YEARS - up to 2 packs/day for much of that time.  . Alcohol Use: Yes     Comment: very rare.  . Drug Use: No  . Sexual Activity: Not on file   Other Topics Concern  . Not on file   Social History Narrative   Lives in [place] by herself.  She does not work.    Review of Systems General:  No chills, fever, night sweats or weight changes.  Cardiovascular: +++chest pain, +++ dyspnea on exertion, +++ edema, +++ orthopnea, no palpitations, paroxysmal nocturnal dyspnea. Dermatological: No rash, lesions/masses Respiratory: +++ cough - occasionally productive, +++ dyspnea Urologic: No hematuria, dysuria Abdominal:   +++ increased abd girth and distention.  +++ diarrhea about 3 days ago.  +++ epigastric tenderness as above.  No nausea, vomiting, bright red blood per rectum, melena, or hematemesis Neurologic:  No visual changes, wkns, changes in mental status. All other systems reviewed and are otherwise negative except as noted above.  Physical Exam  Blood pressure 134/62, pulse 64, temperature 98.1 F (36.7 C), temperature source Oral, resp. rate 17, height _0  (1.676 m),  weight 212 lb (96.163 kg), SpO2 100.00%.  General: Pleasant, dyspneic with talking. Psych: Normal affect. Neuro: Alert and oriented X 3. Moves all extremities spontaneously. HEENT: Normal  Neck: Supple without bruits.  Well healed bilat CEA scars.  JVD to earlobe. Lungs:  Resp regular and unlabored, R>L diminished breath sounds bilat bases with crackles ~ 1/2 up bilat. Heart: RRR, S3, no murmurs. Abdomen: Soft, diffusely tender - worse in epigastric area and ruq.  BS + x 4.  Extremities: No clubbing, cyanosis.  Chronic venous stasis changes bilat lower legs with 3+ bilat LE to thighs.  DP/PT/Radials 2+ and equal bilaterally.  Labs  PT 17.0, INR 1.4  Hemoglobin 12.0, hematocrit 36.9, WBC 4.6, platelets 260  Sodium 133, potassium 3.6, chloride 103, CO2 24, BUN 15, creatinine 0.83, glucose 316, total bilirubin 0.8, AST 48, ALT 39, alkaline phosphatase 113, albumin 3.2, total protein 7.3  Troponin  0.09, followup troponin 0.08  BNP 1469  D-dimer 2.01   Urinalysis yellow, slightly cloudy, pH 6.0, specific gravity greater than 1.030, urine protein greater than 300, ketones negative, blood moderate, nitrite negative, bilirubin negative, leukocyte esterase negative, RBC 0-5, WBC 50-75, epithelial cells 0-5, bacteria large, mucus occasional, urine glucose greater than 1000. Urine culture pending  Radiology/Studies  Chest x-ray showed prominent cardial mediastinal contours with central vessel congestion. Interstitial prominence and infrahilar opacities may reflect edema. Small right pleural effusion and associated airspace consolidation. Atelectasis versus infiltrate.  CT angiography of the chest with contrast showed no evidence of pulmonary embolism. Left internal jugular vein intraluminal hypodensity outlined by a removal reflux contrast is favored to reflect mixing artifact. DVT cannot entirely exclude an. Correlate with left upper extremity veins ultrasound. Right greater left pleural  effusions. Associated airspace consolidations; atelectasis versus pneumonia. Ground glass opacities and interlobular septal thickening, most in keeping with pulmonary edema. Areas of mosaic attenuation favored air trapping. Mediastinal lymphadenopathy may be reactive. Metastatic disease or lymphoma not excluded. Recommend followup when the acute exacerbation resolves.  ECG  Regular sinus rhythm, 76, prior anterior infarct, nonspecific ST-T changes.   ASSESSMENT AND PLAN   1. Acute on chronic combined systolic and diastolic congestive heart failure/ischemic cardiomyopathy: Patient presents with a 3 to four-week history of progressive exertional dyspnea culminating in dyspnea at rest with increasing abdominal girth, orthopnea, lower extremity edema, and early satiety. She has also had chest and epigastric discomfort as well as right upper quadrant tenderness. Her troponin was mildly elevated at [facility] and I suspect this is predominantly secondary to acute heart failure exacerbation. She has marked volume overload on exam and we will aggressively diurese her with IV Lasix. Will add beta blocker and ACE inhibitor therapy and check a 2-D echocardiogram to reevaluate LV function.  If EF is significantly down from prior recording, she will likely require diagnostic catheterization. She is not at a point where she could lie flat. We'll ask case management to see as she reports difficulty in affording her medications. We'll have to keep this in mind as we move forward.   2. Midsternal chest pain/coronary artery disease: She is status post coronary artery bypass grafting in 2012. She has not had an ischemic evaluation since. In the setting of above, she's been having chest and epigastric discomfort and has mild troponin elevations. We'll cycle enzymes further and continue aspirin, heparin, and beta blocker. Hold off on statin in the setting of mildly elevated LFTs, which is most likely secondary to  passive congestion in the setting of #1. Check echo as above. As above, his EF significantly down, she may require diagnostic catheterization. Her chest pain is fairly atypical as it is constant and reproducible with palpation over the upper abdomen and epigastric area as well as lower chest. Again, suspect troponin elevation is most likely secondary to heart failure.  3. Diabetes mellitus: Uncontrolled. She is not taking anything at home. Glucose in the ER yesterday was over 300. Will add sliding scale insulin. She will likely require internal medicine evaluation tomorrow for assistance in management of her diabetes. We'll check a hemoglobin A1c.  4. Abnormal chest CT: Mediastinal lymphadenopathy was noted with recommendation for followup in the future. There was also an intraluminal hypodensity in the left internal jugular vein felt to reflect mixing artifact. DVT cannot be excluded however and left upper extremity venous ultrasound was recommended. We can obtain that.  5. Carotid vascular disease: Status post prior carotid endarterectomy.  This has not been imaged in some. This can be done as an outpatient.  6. Urinary tract infection: Continue fluoroquinolone therapy.  Signed, Murray Hodgkins, NP 03/18/2013, 3:53 PM  Patient seen and examined independently. Emeline Gins, NP note reviewed carefully - agree with his assessment and plan. I have edited the note based on my findings.   Patient with a/c HF with marked volume overload in setting of medication noncompliance and lack of f/u. Plan IV diuresis. Check echo. If EF markedly worse may need cath.  Treat UTI.   Benay Spice 5:35 PM

## 2013-03-18 NOTE — Progress Notes (Addendum)
ANTICOAGULATION CONSULT NOTE - Initial Consult  Pharmacy Consult for heparin  Indication: chest pain/ACS  Allergies  Allergen Reactions  . Other Shortness Of Breath and Rash    All berries   . Strawberry Hives, Shortness Of Breath and Rash  . Sulfa Antibiotics Swelling    Patient Measurements: Height: _0  (167.6 cm) Weight: 212 lb (96.163 kg) IBW/kg (Calculated) : 59.3 Heparin Dosing Weight: 83 kg  Vital Signs: Temp: 98.1 F (36.7 C) (03/10 1408) Temp src: Oral (03/10 1408) BP: 134/62 mmHg (03/10 1408) Pulse Rate: 64 (03/10 1408)  Labs: No results found for this basename: HGB, HCT, PLT, APTT, LABPROT, INR, HEPARINUNFRC, CREATININE, CKTOTAL, CKMB, TROPONINI,  in the last 72 hours  Estimated Creatinine Clearance: 58.6 ml/min (by C-G formula based on Cr of 1.12).   Medical History: Past Medical History  Diagnosis Date  . Coronary atherosclerosis of native coronary artery     a. 11/2010 NSTEMI/CABG x 4: L-LAD; S-PL; S-OM; S-DX, by PVT.  [name] Chronic combined systolic and diastolic CHF (congestive heart failure)     a. 11/2010 TEE: EF 40-45%, no veg or LA thrombus.  . Mixed hyperlipidemia   . Essential hypertension, benign   . Depression   . Cellulitis     a. Recurrent, bilateral legs  . Arthritis   . Carotid artery disease     a. Bilateral CEA; 50-60% bilateral ICA stenosis, 11/12  . PAD (peripheral artery disease)   . Type 2 diabetes mellitus   . Suicide attempt 08/2002  . TIA (transient ischemic attack)   . TMJ syndrome   . Stroke 08/2009  . Herniated disc   . Ischemic cardiomyopathy     a. 11/2010 TEE: EF 40-45%.      Assessment: 67yof with hx CAD/CABG, ICM EF 40% admitted to [facility] with CP, SOB.  Her Tp bumped slightly 0.8 and she was started on heparin drip 1240 ut/hr after bolus 3600 uts and transferred to [facility].    Goal of Therapy:  Heparin level 0.3-0.7 units/ml Monitor platelets by anticoagulation protocol: Yes   Plan:  Continue Heparin  drip 1240ut/hr - started at [facility] now - as drip started at 1100 today Adjust drip as needed Daily HL, CBC  Bonnita Nasuti Pharm.D. CPP, BCPS Clinical Pharmacist [phone] 03/18/2013 5:00 PM   PM heparin level 0.57 at goal no change  Bonnita Nasuti Pharm.D. CPP, BCPS Clinical Pharmacist [phone] 03/18/2013 8:48 PM

## 2013-03-18 NOTE — Progress Notes (Signed)
Pt continues to complain of chest pain, will add morphine prn.  EMS gave en route from [facility] with relief.

## 2013-03-19 ENCOUNTER — Encounter (HOSPITAL_COMMUNITY): Payer: Self-pay | Admitting: General Practice

## 2013-03-19 DIAGNOSIS — R079 Chest pain, unspecified: Secondary | ICD-10-CM | POA: Diagnosis present

## 2013-03-19 DIAGNOSIS — I2699 Other pulmonary embolism without acute cor pulmonale: Secondary | ICD-10-CM

## 2013-03-19 DIAGNOSIS — N390 Urinary tract infection, site not specified: Secondary | ICD-10-CM | POA: Diagnosis not present

## 2013-03-19 DIAGNOSIS — R0989 Other specified symptoms and signs involving the circulatory and respiratory systems: Secondary | ICD-10-CM

## 2013-03-19 DIAGNOSIS — R0609 Other forms of dyspnea: Secondary | ICD-10-CM

## 2013-03-19 LAB — BLOOD GAS, ARTERIAL
Acid-Base Excess: 4.9 mmol/L — ABNORMAL HIGH (ref 0.0–2.0)
BICARBONATE: 29.2 meq/L — AB (ref 20.0–24.0)
Drawn by: 331001
O2 Content: 2.5 L/min
O2 Saturation: 96 %
PATIENT TEMPERATURE: 98.6
PCO2 ART: 45.7 mmHg — AB (ref 35.0–45.0)
TCO2: 30.6 mmol/L (ref 0–100)
pH, Arterial: 7.422 (ref 7.350–7.450)
pO2, Arterial: 78.9 mmHg — ABNORMAL LOW (ref 80.0–100.0)

## 2013-03-19 LAB — COMPREHENSIVE METABOLIC PANEL
ALT: 26 U/L (ref 0–35)
AST: 26 U/L (ref 0–37)
Albumin: 2.6 g/dL — ABNORMAL LOW (ref 3.5–5.2)
Alkaline Phosphatase: 98 U/L (ref 39–117)
BUN: 21 mg/dL (ref 6–23)
CALCIUM: 8.8 mg/dL (ref 8.4–10.5)
CO2: 30 mEq/L (ref 19–32)
CREATININE: 1.1 mg/dL (ref 0.50–1.10)
Chloride: 97 mEq/L (ref 96–112)
GFR, EST AFRICAN AMERICAN: 60 mL/min — AB (ref >90)
GFR, EST NON AFRICAN AMERICAN: 52 mL/min — AB (ref >90)
GLUCOSE: 197 mg/dL — AB (ref 70–99)
Potassium: 3.9 mEq/L (ref 3.7–5.3)
Sodium: 139 mEq/L (ref 137–147)
Total Bilirubin: 0.3 mg/dL (ref 0.3–1.2)
Total Protein: 6.4 g/dL (ref 6.0–8.3)

## 2013-03-19 LAB — CBC WITH DIFFERENTIAL/PLATELET
Basophils Absolute: 0 10*3/uL (ref 0.0–0.1)
Basophils Relative: 0 % (ref 0–1)
EOS PCT: 2 % (ref 0–5)
Eosinophils Absolute: 0.1 10*3/uL (ref 0.0–0.7)
HEMATOCRIT: 36.1 % (ref 36.0–46.0)
HEMOGLOBIN: 11.3 g/dL — AB (ref 12.0–15.0)
LYMPHS ABS: 0.9 10*3/uL (ref 0.7–4.0)
Lymphocytes Relative: 20 % (ref 12–46)
MCH: 27.2 pg (ref 26.0–34.0)
MCHC: 31.3 g/dL (ref 30.0–36.0)
MCV: 87 fL (ref 78.0–100.0)
MONO ABS: 0.7 10*3/uL (ref 0.1–1.0)
MONOS PCT: 14 % — AB (ref 3–12)
NEUTROS ABS: 3 10*3/uL (ref 1.7–7.7)
Neutrophils Relative %: 64 % (ref 43–77)
Platelets: 230 10*3/uL (ref 150–400)
RBC: 4.15 MIL/uL (ref 3.87–5.11)
RDW: 15 % (ref 11.5–15.5)
WBC: 4.7 10*3/uL (ref 4.0–10.5)

## 2013-03-19 LAB — INFLUENZA PANEL BY PCR (TYPE A & B)
H1N1 flu by pcr: NOT DETECTED
INFLAPCR: NEGATIVE
Influenza B By PCR: POSITIVE — AB

## 2013-03-19 LAB — GLUCOSE, CAPILLARY
Glucose-Capillary: 182 mg/dL — ABNORMAL HIGH (ref 70–99)
Glucose-Capillary: 213 mg/dL — ABNORMAL HIGH (ref 70–99)
Glucose-Capillary: 157 mg/dL — ABNORMAL HIGH (ref 70–99)

## 2013-03-19 LAB — TSH: TSH: 3.001 u[IU]/mL (ref 0.350–4.500)

## 2013-03-19 LAB — HEMOGLOBIN A1C
Hgb A1c MFr Bld: 10.8 % — ABNORMAL HIGH (ref <5.7)
Mean Plasma Glucose: 263 mg/dL — ABNORMAL HIGH (ref <117)

## 2013-03-19 LAB — TROPONIN I: Troponin I: <0.3 ng/mL (ref <0.30)

## 2013-03-19 LAB — HEPARIN LEVEL (UNFRACTIONATED): HEPARIN UNFRACTIONATED: 0.37 [IU]/mL (ref 0.30–0.70)

## 2013-03-19 MED ORDER — ALBUTEROL SULFATE (2.5 MG/3ML) 0.083% IN NEBU
2.5000 mg | INHALATION_SOLUTION | RESPIRATORY_TRACT | Status: DC
  Administered 2013-03-19 – 2013-03-20 (×4): 2.5 mg via RESPIRATORY_TRACT
  Filled 2013-03-19 (×5): qty 3

## 2013-03-19 MED ORDER — ONDANSETRON HCL 4 MG/2ML IJ SOLN
INTRAMUSCULAR | Status: AC
  Administered 2013-03-19: 4 mg via INTRAVENOUS
  Filled 2013-03-19: qty 2

## 2013-03-19 MED ORDER — ONDANSETRON HCL 4 MG/2ML IJ SOLN
4.0000 mg | Freq: Four times a day (QID) | INTRAMUSCULAR | Status: DC | PRN
  Administered 2013-03-19 – 2013-03-27 (×8): 4 mg via INTRAVENOUS
  Filled 2013-03-19 (×9): qty 2

## 2013-03-19 MED ORDER — ONDANSETRON HCL 4 MG/2ML IJ SOLN
4.0000 mg | Freq: Once | INTRAMUSCULAR | Status: AC
  Administered 2013-03-19 (×2): 4 mg via INTRAVENOUS

## 2013-03-19 MED ORDER — ONDANSETRON HCL 4 MG/2ML IJ SOLN
4.0000 mg | Freq: Once | INTRAMUSCULAR | Status: AC
  Administered 2013-03-19: 4 mg via INTRAVENOUS
  Filled 2013-03-19: qty 2

## 2013-03-19 MED ORDER — PNEUMOCOCCAL VAC POLYVALENT 25 MCG/0.5ML IJ INJ
0.5000 mL | INJECTION | INTRAMUSCULAR | Status: AC
  Administered 2013-03-21: 0.5 mL via INTRAMUSCULAR
  Filled 2013-03-19 (×2): qty 0.5

## 2013-03-19 NOTE — Progress Notes (Signed)
UR Completed Chaunda Vandergriff Graves-Bigelow, RN,BSN [phone]  

## 2013-03-19 NOTE — Significant Event (Signed)
Rapid Response Event Note  Overview: Time Called: 1409 Arrival Time: 1412 Event Type: Respiratory  Initial Focused Assessment:  Called by Rn at request of Dr. Cathie Olden.  Upon arrival to patients bedside, Rn at bedside.  Spoke with Dr. Cathie Olden.  Patient is sitting in bed with nasal cannula 2.5 LPM.  Patient with increased WOB and expiratory wheezes.  Patient with nausea and vomiting. BP 169/88, HR 80   Interventions:  ABG done and results reviewed.  Patient received a nebulizer treatment and received another 4 mg IV zofran as per order.  Patient  breathing a little easier for a few minutes, however did not last long, currently back to increased WOB and wheezing.      Event Summary:    Spoke with [place], [place], discussed findings and recommended patient to move to SDU.  Rn to call if assistance needed    at      at          [facility], Harlin Rain

## 2013-03-19 NOTE — Progress Notes (Signed)
Pt Profile: 69 year old female with prior history of coronary artery disease, status post coronary artery bypass grafting in 2012, and ischemic cardiomyopathy w/ EF of 40-45% (2012) who was admitted to [facility] on 03/18/13 after transfer from [facility] with chest pain, acute heart failure, and mild troponin elevation.  Initial Troponin 0.09, followup troponin 0.08   Initial BNP 1469  Subjective:  Pt notes significant improvement in breathing. She continues to have mild SSCP/ mid epigastric pain, although worse with cough.   Objective: Vital signs in last 24 hours: Temp:  [97.9 F (36.6 C)-98.7 F (37.1 C)] 98.7 F (37.1 C) (03/11 0504) Pulse Rate:  [64-68] 68 (03/11 0504) Resp:  [15-17] 16 (03/11 0504) BP: (134-148)/(62-64) 140/64 mmHg (03/11 0504) SpO2:  [98 %-100 %] 98 % (03/10 2100) Weight:  [209 lb 6.4 oz (94.983 kg)-212 lb (96.163 kg)] 209 lb 6.4 oz (94.983 kg) (03/11 0504) Last BM Date: 03/17/13  Intake/Output from previous day: 03/10 0701 - 03/11 0700 In: -  Out: 1525 [Urine:1525] Intake/Output this shift:    Medications Current Facility-Administered Medications  Medication Dose Route Frequency Provider Last Rate Last Dose  . 0.9 %  sodium chloride infusion  250 mL Intravenous PRN Rogelia Mire, NP      . aspirin EC tablet 81 mg  81 mg Oral Daily Rogelia Mire, NP      . captopril (CAPOTEN) tablet 6.25 mg  6.25 mg Oral TID Rogelia Mire, NP   6.25 mg at 03/18/13 2145  . carvedilol (COREG) tablet 3.125 mg  3.125 mg Oral BID WC Rogelia Mire, NP   3.125 mg at 03/18/13 1816  . ciprofloxacin (CIPRO) tablet 500 mg  500 mg Oral BID Rogelia Mire, NP   500 mg at 03/18/13 2146  . furosemide (LASIX) injection 80 mg  80 mg Intravenous BID Rogelia Mire, NP   80 mg at 03/18/13 1815  . heparin ADULT infusion 100 units/mL (25000 units/250 mL)  1,250 Units/hr Intravenous Continuous Jolaine Artist, MD 12.5 mL/hr at 03/18/13 1816 1,250  Units/hr at 03/18/13 1816  . insulin aspart (novoLOG) injection 0-15 Units  0-15 Units Subcutaneous TID WC Rogelia Mire, NP   2 Units at 03/18/13 1817  . insulin aspart (novoLOG) injection 4 Units  4 Units Subcutaneous TID WC Rogelia Mire, NP   4 Units at 03/18/13 1817  . morphine 2 MG/ML injection 2 mg  2 mg Intravenous Q3H PRN Cecilie Kicks, NP   2 mg at 03/19/13 0431  . multivitamin with minerals tablet 1 tablet  1 tablet Oral Daily Rogelia Mire, NP   1 tablet at 03/18/13 1819  . sodium chloride 0.9 % injection 3 mL  3 mL Intravenous Q12H Rogelia Mire, NP      . sodium chloride 0.9 % injection 3 mL  3 mL Intravenous PRN Rogelia Mire, NP        PE: General appearance: alert, cooperative and no distress Neck: + JVD at the level of the ear Lungs: Right sided badilar rales, Left lung field fairly clear Heart: regular rate and rhythm Extremities: tense bilateral LEE Pulses: 2+ and symmetric Skin: warm and dry Neurologic: Grossly normal  Lab Results:   Recent Labs  03/19/13 0151  WBC 4.7  HGB 11.3*  HCT 36.1  PLT 230   BMET  Recent Labs  03/19/13 0151  NA 139  K 3.9  CL 97  CO2 30  GLUCOSE 197*  BUN 21  CREATININE 1.10  CALCIUM 8.8     Cardiac Panel (last 3 results)  Recent Labs  03/18/13 1847 03/19/13 0151  TROPONINI <0.30 <0.30    Studies/Results:  2D Echo  Study Conclusions  - Left ventricle: The cavity size was moderately dilated. Wall thickness was normal. The estimated ejection fraction was 30%. Diffuse hypokinesis. - Left atrium: The atrium was moderately dilated. - Atrial septum: No defect or patent foramen ovale was identified. - Pulmonary arteries: PA peak pressure: 38m Hg (S). - Impressions: Elevated LA pressure by E/E' Impressions:  - Elevated LA pressure by E/E'   Assessment/Plan  Principal Problem:   Acute on chronic combined systolic and diastolic congestive heart failure, NYHA class 4 Active  Problems:   Carotid artery disease   Coronary atherosclerosis of native coronary artery   Diabetes mellitus type II, uncontrolled   Chest pain with moderate risk for cardiac etiology   UTI (urinary tract infection)   1.Acute on chronic combined systolic and diastolic CHF/ ischemic cardiomyopathy - Repeat 2D echo yesterday demonstrated further reduction in LV systolic function, compared to prior study in 2012. EF is now 30%, down from 40-45%. She was also noted to have diffuse hypokinesis and elevated LA pressures. Based on these findings, I think we should pursue both a R/LHC. For now, we will continue to diurese with IV diuretics. Her breathing has improved, however she continues to be volume overloaded with evidence of JVD, at the level of the ear, Right sided basilar crackles and tense bilateral LEE. She has diuresed 1.5 L since admission. Continue IV Lasix 80 mg BID. BP remains stable, as well as renal function and electrolytes. Also will resume ACE-I and BB.    2. Midsternal chest pain/ Coronary Artery Disease - Continues to have mild resting discomfort, although some components are slightly atypical in that her pain is exacerbated by coughing. [facility] in the setting of problem #1. Subsequent troponins have been negative. In light of further reductions in LVEF, she will likely need to undergo a LHC to redefine her native coronary and graft anatomy. For now, will continue ASA and BB. Continue to hold statin as LFTs were elevated on admit. This is likely due to passive congestion from CHF. However, will wait to start a statin until LFTs improve. Will recheck prior to discharge.  3. Diabetes - uncontrolled. Glucose on admit was over 300. Sliding scale insulin was added yesterday. Fasting BG this am was 182. ? consulting Internal Medicine today for assistance with DM management. She is on a carb notified diet. Will adjust so that she is on a carb modified/  heart healthy diet with 2 gm Na restriction, in light of problem #1.   4. Abnormal Chest CT - Mediastinal lymphadenopathy was noted with recommendation for followup in the future. There was also an intraluminal hypodensity in the left internal jugular vein felt to reflect mixing artifact. DVT cannot be excluded however and left upper extremity venous ultrasound was recommended. This was ordered yesterday. Not yet completed   5. Carotid Vascular Disease - Status post prior carotid endarterectomy. This has not been imaged in some. This can be done as an outpatient.   6. UTI - Continue fluoroquinolone therapy.  MD to follow.   LOS: 1 day   Attending Note:   The patient was seen and examined.   The patients exam is not at all like it was reported this am by the PA.  She appears to have  acutely decompensated - she has shaking chills, nausea, wheezing.   Lungs - very tight wheezes.  Poor air movement  1. respriatory distress:  She has acutely decompensated.  Getting an ABG. Ordered a neb.  Have called rapid response. She may need to go to the unit.  Will need to put off the cath for now   Ramond Dial., MD, [facility] 03/19/2013, 2:10 PM    Brittainy M. Rosita Fire, PA-C 03/19/2013 8:11 AM

## 2013-03-19 NOTE — Progress Notes (Addendum)
ANTICOAGULATION CONSULT NOTE - Follow Up Consult  Pharmacy Consult for heparin  Indication: chest pain/ACS  Allergies  Allergen Reactions  . Other Shortness Of Breath and Rash    All berries   . Strawberry Hives, Shortness Of Breath and Rash  . Sulfa Antibiotics Swelling    Patient Measurements: Height: _0  (167.6 cm) Weight: 209 lb 6.4 oz (94.983 kg) IBW/kg (Calculated) : 59.3 Heparin Dosing Weight: 83 kg  Vital Signs: Temp: 98.7 F (37.1 C) (03/11 0504) Temp src: Oral (03/11 0504) BP: 142/71 mmHg (03/11 0859) Pulse Rate: 69 (03/11 0859)  Labs:  Recent Labs  03/18/13 1847 03/19/13 0151 03/19/13 0750 03/19/13 0950  HGB  --  11.3*  --   --   HCT  --  36.1  --   --   PLT  --  230  --   --   HEPARINUNFRC 0.57  --   --  0.37  CREATININE  --  1.10  --   --   TROPONINI <0.30 <0.30 <0.30  --     Estimated Creatinine Clearance: 59.2 ml/min (by C-G formula based on Cr of 1.1).   Assessment: 67yof with hx CAD/CABG, ICM (EF 30% on echo yesterday) admitted to [facility] with CP, SOB. Transferred to [facility] w/ increase in trops-likely to go for cath but currently unsure of timing. Initial heparin level after transfer therapeutic. A repeat level done this morning remains within range at 0.37 units/mL.  Hgb 11.3, plts 230. No bleeding noted.  Goal of Therapy:  Heparin level 0.3-0.7 units/ml Monitor platelets by anticoagulation protocol: Yes   Plan:  1. Continue Heparin drip 1250 units/hr (changed order to more accurately reflect this- order was for 1240units/hr, but d/t constraints of Alaris pump was actually running at 1250units/hr.) 2. Daily HL and CBC 3. Follow up plans for cath 4. Follow for s/s bleeding  [place] Want D. Raphael Fitzpatrick, PharmD, BCPS Clinical Pharmacist Pager: [phone] 03/19/2013 11:59 AM

## 2013-03-19 NOTE — Progress Notes (Signed)
   I was notified by Rapid Response that the patient will require transfer to a higher unit of care, as she has increased work of breathing, not improved with increased oxygen via [place] and breathing treatments. I have placed transfer orders for patient to be moved to stepdown. I have spoken with both Bed Control and the charge nurse on unit 3W. Dr. Cathie Olden has been notified.  Lyda Jester, PA-C [facility]

## 2013-03-19 NOTE — Care Management Note (Signed)
Page 1 of 2   03/28/2013     3:18:23 PM   CARE MANAGEMENT NOTE 03/28/2013  Patient:  [name] [name], [name] [name]   Account Number:  [id number]  Date Initiated:  03/19/2013  Documentation initiated by:  GRAVES-BIGELOW,BRENDA  Subjective/Objective Assessment:   Pt presents on transfer from [facility] with chest pain, acute heart failure, and mild troponin elevation. Pt states she lives alone and that she is having a hard time affording co pays for medications.     Action/Plan:   CM unable to assist with meds due to insurance at this time. Pt would benefit from all generics if possible. Pt will enefit from [facility] once stable for d/c. May benefit from PT consult as well. At visit pt was nauseated & unable to talk much.   Anticipated DC Date:  03/21/2013   Anticipated DC Plan:  [place]  CM consult      [facility] Choice  DURABLE MEDICAL EQUIPMENT   Choice offered to / List presented to:     DME arranged  [facility]      DME agency  [place].        Status of service:  In process, will continue to follow Medicare Important Message given?   (If response is "NO", the following Medicare IM given date fields will be blank) Date Medicare IM given:   Date Additional Medicare IM given:    Discharge Disposition:    Per UR Regulation:  Reviewed for med. necessity/level of care/duration of stay  If discussed at [place] of Stay Meetings, dates discussed:   03/25/2013  03/27/2013    Comments:  03/28/13 [place], RN,BSN, NCM [phone] Pt qualifies for home oxygen.  DME oxygen ordered through [facility]; Jermaine notified that pt aticiapted d/c is tomorrow. Medication assistance not available as pt has insurance. Pt made aware.  Suggest changing pharmacy to [facility] or [facility] to take advantage of $4 medications.  03/27/13 [place] MSN BSN CCM Cath completed: Successful PTCA/DES x 1 distal body of SVG to Diagonal.   Pt will be on Plavix which is now generic.

## 2013-03-19 NOTE — Progress Notes (Addendum)
Inpatient Diabetes Program Recommendations  AACE/ADA: New Consensus Statement on Inpatient Glycemic Control (2013)  Target Ranges:  Prepandial:   less than 140 mg/dL      Peak postprandial:   less than 180 mg/dL (1-2 hours)      Critically ill patients:  140 - 180 mg/dL     Results for [name] [name], [name] [name] (MRN [id number]) as of 03/19/2013 13:39  Ref. Range 03/19/2013 07:32 03/19/2013 11:23  Glucose-Capillary Latest Range: 70-99 mg/dL 182 (H) 213 (H)    Results for [name], [name] (MRN [id number]) as of 03/19/2013 13:39  Ref. Range 03/18/2013 18:47  Hemoglobin A1C Latest Range: <5.7 % 10.8 (H)      **Pt admitted with CP.  Has significant PMH of DM2, CAD, HTN, CABG (2012).  **Per records, patient was d/c'd home on insulin (Lantus and Humalog) back in 2012 after her CABG.  **Spoke with pt about her elevated A1c of 10.8%.  Explained what an A1c is and what it measures.  Reminded patient that her goal A1c is 7% or less per ADA standards to prevent both acute and long-term complications.  Encouraged patient to check her CBGs at least bid at home (fasting and another check within the day) and to record all CBGs in a logbook for her PCP to review.  Pt sees Dr. Woody Seller in [place] as PCP.  **Pt has not seen her PCP or taken any medications since March of 2014.  Pt stated to me that she cannot afford her co-pays for her Rxs.  Pt has Medicare and also has Medicare Part D through secondary insurance.  Pt told me she cannot see the lines on a traditional insulin syringe and was using insulin pens but stopped using the insulin pens b/c she cannot afford them.  Pt appears very weak and does not appear to be able to take care of herself at present.  Noted care mgmt recommending PT/OT consult to evaluate pt's ability to care for herself.  **At present, pt is receiving Novolog Moderate SSi + Novolog 4 units tid as meal coverage.  CBGs just fair at present. Noted pt was vomiting during my visit with her and I am unsure what  her PO intake has been.  Pt likely needs insulin at home, however, I am not sure she will be able to afford her insulin.  Vials are cheaper, however, pt stated she cannot read the lines on the traditional insulin syringes.  Perhaps, Metformin and Amaryl at d/c would be a better option?  Both of these meds are on the $4 generic list at [facility].  Also, Gave patient information on purchasing an inexpensive CBG meter and strips at [employer].  Meter is $16 and a box of 50 strips is $9.    Will follow. Wyn Quaker RN, MSN, CDE Diabetes Coordinator Inpatient Diabetes Program Team Pager: [phone] (8a-10p)

## 2013-03-19 NOTE — Progress Notes (Addendum)
*  Preliminary Results* Left upper extremity venous duplex completed. Left upper extremity is negative for deep and superficial vein thrombosis.  03/19/2013 10:00 AM  Maudry Mayhew, RVT, RDCS, RDMS

## 2013-03-20 DIAGNOSIS — I5022 Chronic systolic (congestive) heart failure: Secondary | ICD-10-CM

## 2013-03-20 DIAGNOSIS — J111 Influenza due to unidentified influenza virus with other respiratory manifestations: Secondary | ICD-10-CM

## 2013-03-20 LAB — BASIC METABOLIC PANEL
BUN: 22 mg/dL (ref 6–23)
CO2: 30 meq/L (ref 19–32)
CREATININE: 1.04 mg/dL (ref 0.50–1.10)
Calcium: 8.6 mg/dL (ref 8.4–10.5)
Chloride: 95 mEq/L — ABNORMAL LOW (ref 96–112)
GFR calc Af Amer: 64 mL/min — ABNORMAL LOW (ref >90)
GFR calc non Af Amer: 55 mL/min — ABNORMAL LOW (ref >90)
Glucose, Bld: 217 mg/dL — ABNORMAL HIGH (ref 70–99)
Potassium: 3.4 mEq/L — ABNORMAL LOW (ref 3.7–5.3)
Sodium: 138 mEq/L (ref 137–147)

## 2013-03-20 LAB — HEPARIN LEVEL (UNFRACTIONATED)
HEPARIN UNFRACTIONATED: 0.21 [IU]/mL — AB (ref 0.30–0.70)
Heparin Unfractionated: 0.48 IU/mL (ref 0.30–0.70)

## 2013-03-20 LAB — GLUCOSE, CAPILLARY
GLUCOSE-CAPILLARY: 223 mg/dL — AB (ref 70–99)
GLUCOSE-CAPILLARY: 277 mg/dL — AB (ref 70–99)
Glucose-Capillary: 171 mg/dL — ABNORMAL HIGH (ref 70–99)
Glucose-Capillary: 207 mg/dL — ABNORMAL HIGH (ref 70–99)
Glucose-Capillary: 226 mg/dL — ABNORMAL HIGH (ref 70–99)
Glucose-Capillary: 261 mg/dL — ABNORMAL HIGH (ref 70–99)

## 2013-03-20 LAB — CBC
HEMATOCRIT: 33.6 % — AB (ref 36.0–46.0)
Hemoglobin: 10.6 g/dL — ABNORMAL LOW (ref 12.0–15.0)
MCH: 27.2 pg (ref 26.0–34.0)
MCHC: 31.5 g/dL (ref 30.0–36.0)
MCV: 86.4 fL (ref 78.0–100.0)
Platelets: 222 10*3/uL (ref 150–400)
RBC: 3.89 MIL/uL (ref 3.87–5.11)
RDW: 14.9 % (ref 11.5–15.5)
WBC: 3.9 10*3/uL — ABNORMAL LOW (ref 4.0–10.5)

## 2013-03-20 LAB — PRO B NATRIURETIC PEPTIDE: PRO B NATRI PEPTIDE: 2927 pg/mL — AB (ref 0–125)

## 2013-03-20 MED ORDER — ALBUTEROL SULFATE (2.5 MG/3ML) 0.083% IN NEBU
2.5000 mg | INHALATION_SOLUTION | Freq: Three times a day (TID) | RESPIRATORY_TRACT | Status: DC
  Administered 2013-03-20 – 2013-03-26 (×19): 2.5 mg via RESPIRATORY_TRACT
  Filled 2013-03-20 (×19): qty 3

## 2013-03-20 MED ORDER — HEPARIN BOLUS VIA INFUSION
2000.0000 [IU] | Freq: Once | INTRAVENOUS | Status: AC
  Administered 2013-03-20: 2000 [IU] via INTRAVENOUS
  Filled 2013-03-20: qty 2000

## 2013-03-20 MED ORDER — POTASSIUM CHLORIDE CRYS ER 20 MEQ PO TBCR
40.0000 meq | EXTENDED_RELEASE_TABLET | Freq: Once | ORAL | Status: AC
  Administered 2013-03-20: 40 meq via ORAL
  Filled 2013-03-20: qty 2

## 2013-03-20 NOTE — Progress Notes (Signed)
PROGRESS NOTE  Subjective:   68 year old female with prior history of coronary artery disease and ischemic cardiomyopathy status post coronary artery bypass grafting in 2012 who presents on transfer from [facility] with chest pain, acute heart failure, and mild troponin elevation.  She has been feeling sick for 1 week.  CP, difficulty breathing. Denied fever.  Yesterday when I arrived to see her she had acutely decompensated with fever, chills, profound wheezing.  She is  + for Influenza B. Started nebs. Feeling better.   Objective:    Vital Signs:   Temp:  [97.8 F (36.6 C)-99.1 F (37.3 C)] 97.8 F (36.6 C) (03/12 0737) Pulse Rate:  [60-81] 63 (03/12 0737) Resp:  [12-24] 12 (03/12 0737) BP: (112-173)/(58-93) 112/93 mmHg (03/12 0737) SpO2:  [90 %-100 %] 100 % (03/12 0737) Weight:  [209 lb 10.5 oz (95.1 kg)] 209 lb 10.5 oz (95.1 kg) (03/12 0400)  Last BM Date: 03/18/13   24-hour weight change: Weight change: -2 lb 5.5 oz (-1.063 kg)  Weight trends: Filed Weights   03/19/13 0504 03/19/13 1635 03/20/13 0400  Weight: 209 lb 6.4 oz (94.983 kg) 209 lb 10.5 oz (95.1 kg) 209 lb 10.5 oz (95.1 kg)    Intake/Output:  03/11 0701 - 03/12 0700 In: 390 [I.V.:390] Out: 2350 [Urine:2350]     Physical Exam: BP 112/93  Pulse 63  Temp(Src) 97.8 F (36.6 C) (Oral)  Resp 12  Ht _0  (1.676 m)  Wt 209 lb 10.5 oz (95.1 kg)  BMI 33.86 kg/m2  SpO2 100%  Wt Readings from Last 3 Encounters:  03/20/13 209 lb 10.5 oz (95.1 kg)  03/30/11 201 lb 6.4 oz (91.354 kg)  01/13/11 210 lb (95.255 kg)    General: Vital signs reviewed and noted.   Head: Normocephalic, atraumatic.  Eyes: conjunctivae/corneas clear.  EOM's intact.   Throat: normal  Neck:  normal  Lungs:    moderately tight bilateral wjheezing.   Heart:  RR.   Abdomen:  Soft, non-tender, non-distended    Extremities: No edema   Neurologic: A&O X3, CN II - XII are grossly intact.   Psych: Normal      Labs: BMET:  Recent Labs  03/19/13 0151 03/20/13 0341  NA 139 138  K 3.9 3.4*  CL 97 95*  CO2 30 30  GLUCOSE 197* 217*  BUN 21 22  CREATININE 1.10 1.04  CALCIUM 8.8 8.6    Liver function tests:  Recent Labs  03/19/13 0151  AST 26  ALT 26  ALKPHOS 98  BILITOT 0.3  PROT 6.4  ALBUMIN 2.6*   No results found for this basename: LIPASE, AMYLASE,  in the last 72 hours  CBC:  Recent Labs  03/19/13 0151 03/20/13 0341  WBC 4.7 3.9*  NEUTROABS 3.0  --   HGB 11.3* 10.6*  HCT 36.1 33.6*  MCV 87.0 86.4  PLT 230 222    Cardiac Enzymes:  Recent Labs  03/18/13 1847 03/19/13 0151 03/19/13 0750  TROPONINI <0.30 <0.30 <0.30    Coagulation Studies: No results found for this basename: LABPROT, INR,  in the last 72 hours  Other: No components found with this basename: POCBNP,  No results found for this basename: DDIMER,  in the last 72 hours  Recent Labs  03/18/13 1847  HGBA1C 10.8*   No results found for this basename: CHOL, HDL, LDLCALC, TRIG, CHOLHDL,  in the last 72 hours  Recent Labs  03/18/13 1847  TSH 3.001  No results found for this basename: VITAMINB12, FOLATE, FERRITIN, TIBC, IRON, RETICCTPCT,  in the last 72 hours   Other results:  Tele:  NSR  Medications:    Infusions: . heparin 1,450 Units/hr (03/20/13 0629)    Scheduled Medications: . albuterol  2.5 mg Nebulization TID  . aspirin EC  81 mg Oral Daily  . captopril  6.25 mg Oral TID  . carvedilol  3.125 mg Oral BID WC  . ciprofloxacin  500 mg Oral BID  . furosemide  80 mg Intravenous BID  . insulin aspart  0-15 Units Subcutaneous TID WC  . insulin aspart  4 Units Subcutaneous TID WC  . multivitamin with minerals  1 tablet Oral Daily  . pneumococcal 23 valent vaccine  0.5 mL Intramuscular Tomorrow-1000  . potassium chloride  40 mEq Oral Once  . sodium chloride  3 mL Intravenous Q12H    Assessment/ Plan:   Principal Problem:   Acute on chronic combined systolic and  diastolic congestive heart failure, NYHA class 4 Active Problems:   Carotid artery disease   Coronary atherosclerosis of native coronary artery   Diabetes mellitus type II, uncontrolled   Chest pain with moderate risk for cardiac etiology   UTI (urinary tract infection)  1. Respiratory distress:  I suspect her recent episode of worsening dyspnea was due to the flu.   No evidence of CAD - Troponin levels are negative Will get BNP  2. Chronic systolic CHF:  Continue current supportive care. Will need to get over the flu before we can accurately assess her.  3. CAD:  Stable, no angina. Troponin is negative.    Disposition:  Length of Stay: 2  Thayer Headings, Brooke Bonito., MD, [facility] 03/20/2013, 10:59 AM Office [phone] Pager [phone]

## 2013-03-20 NOTE — Progress Notes (Signed)
RN called to notify that patient is hypokalemic with K of 3.4. This is in the setting of IV diuretic therapy. Will order for supplemental K to be given now. We will she [name] [name] today on rounds.  [name] Jester, PA-C [facility]

## 2013-03-20 NOTE — Progress Notes (Signed)
[place] for heparin  Indication: chest pain/ACS  Allergies  Allergen Reactions  . Other Shortness Of Breath and Rash    All berries   . Strawberry Hives, Shortness Of Breath and Rash  . Sulfa Antibiotics Swelling    Patient Measurements: Height: _0  (167.6 cm) Weight: 209 lb 10.5 oz (95.1 kg) IBW/kg (Calculated) : 59.3 Heparin Dosing Weight: 83 kg  Vital Signs: Temp: 97.9 F (36.6 C) (03/12 0400) Temp src: Oral (03/12 0400) BP: 137/66 mmHg (03/12 0400) Pulse Rate: 62 (03/12 0400)  Labs:  Recent Labs  03/18/13 1847 03/19/13 0151 03/19/13 0750 03/19/13 0950 03/20/13 0341  HGB  --  11.3*  --   --  10.6*  HCT  --  36.1  --   --  33.6*  PLT  --  230  --   --  222  HEPARINUNFRC 0.57  --   --  0.37 0.21*  CREATININE  --  1.10  --   --  1.04  TROPONINI <0.30 <0.30 <0.30  --   --     Estimated Creatinine Clearance: 62.7 ml/min (by C-G formula based on Cr of 1.04).   Assessment: 68 yo female with chest pain/CHF for heparin  Goal of Therapy:  Heparin level 0.3-0.7 units/ml Monitor platelets by anticoagulation protocol: Yes   Plan:  Heparin 2000 units IV bolus, then increase heparin 1450 units/hr Check heparin level in 8 hours.  Phillis Knack, PharmD, BCPS

## 2013-03-20 NOTE — Progress Notes (Signed)
[place] for heparin  Indication: chest pain/ACS  Allergies  Allergen Reactions  . Other Shortness Of Breath and Rash    All berries   . Strawberry Hives, Shortness Of Breath and Rash  . Sulfa Antibiotics Swelling    Patient Measurements: Height: _0  (167.6 cm) Weight: 209 lb 10.5 oz (95.1 kg) IBW/kg (Calculated) : 59.3 Heparin Dosing Weight: 83 kg  Vital Signs: Temp: 97.9 F (36.6 C) (03/12 1537) Temp src: Oral (03/12 1537) BP: 136/75 mmHg (03/12 1537) Pulse Rate: 70 (03/12 1537)  Labs:  Recent Labs  03/18/13 1847 03/19/13 0151 03/19/13 0750 03/19/13 0950 03/20/13 0341 03/20/13 1540  HGB  --  11.3*  --   --  10.6*  --   HCT  --  36.1  --   --  33.6*  --   PLT  --  230  --   --  222  --   HEPARINUNFRC 0.57  --   --  0.37 0.21* 0.48  CREATININE  --  1.10  --   --  1.04  --   TROPONINI <0.30 <0.30 <0.30  --   --   --     Estimated Creatinine Clearance: 62.7 ml/min (by C-G formula based on Cr of 1.04).   Assessment: 68 yo female with chest pain/CHF for heparin.  Heparin level is now therapeutic at 0.48 after dosage increase  Goal of Therapy:  Heparin level 0.3-0.7 units/ml Monitor platelets by anticoagulation protocol: Yes   Plan:  Continue heparin at 1450 units/hr. Heparin level and CBC in AM  Heide Guile, PharmD, BCPS Clinical Pharmacist Pager [phone]

## 2013-03-21 DIAGNOSIS — I214 Non-ST elevation (NSTEMI) myocardial infarction: Secondary | ICD-10-CM | POA: Diagnosis present

## 2013-03-21 DIAGNOSIS — R079 Chest pain, unspecified: Secondary | ICD-10-CM

## 2013-03-21 LAB — BASIC METABOLIC PANEL
BUN: 24 mg/dL — ABNORMAL HIGH (ref 6–23)
CO2: 31 mEq/L (ref 19–32)
Calcium: 8.4 mg/dL (ref 8.4–10.5)
Chloride: 92 mEq/L — ABNORMAL LOW (ref 96–112)
Creatinine, Ser: 1.07 mg/dL (ref 0.50–1.10)
GFR calc Af Amer: 62 mL/min — ABNORMAL LOW (ref >90)
GFR calc non Af Amer: 53 mL/min — ABNORMAL LOW (ref >90)
Glucose, Bld: 225 mg/dL — ABNORMAL HIGH (ref 70–99)
POTASSIUM: 4 meq/L (ref 3.7–5.3)
Sodium: 135 mEq/L — ABNORMAL LOW (ref 137–147)

## 2013-03-21 LAB — HEPARIN LEVEL (UNFRACTIONATED): Heparin Unfractionated: 0.3 IU/mL (ref 0.30–0.70)

## 2013-03-21 LAB — CBC
HCT: 33.3 % — ABNORMAL LOW (ref 36.0–46.0)
Hemoglobin: 10.5 g/dL — ABNORMAL LOW (ref 12.0–15.0)
MCH: 27.3 pg (ref 26.0–34.0)
MCHC: 31.5 g/dL (ref 30.0–36.0)
MCV: 86.5 fL (ref 78.0–100.0)
Platelets: 212 10*3/uL (ref 150–400)
RBC: 3.85 MIL/uL — AB (ref 3.87–5.11)
RDW: 14.7 % (ref 11.5–15.5)
WBC: 3.9 10*3/uL — ABNORMAL LOW (ref 4.0–10.5)

## 2013-03-21 LAB — GLUCOSE, CAPILLARY
GLUCOSE-CAPILLARY: 189 mg/dL — AB (ref 70–99)
GLUCOSE-CAPILLARY: 173 mg/dL — AB (ref 70–99)
GLUCOSE-CAPILLARY: 196 mg/dL — AB (ref 70–99)
Glucose-Capillary: 258 mg/dL — ABNORMAL HIGH (ref 70–99)

## 2013-03-21 NOTE — Progress Notes (Signed)
[place] for heparin  Indication: chest pain/ACS  Allergies  Allergen Reactions  . Other Shortness Of Breath and Rash    All berries   . Strawberry Hives, Shortness Of Breath and Rash  . Sulfa Antibiotics Swelling    Patient Measurements: Height: _0  (167.6 cm) Weight: 194 lb 0.1 oz (88 kg) IBW/kg (Calculated) : 59.3 Heparin Dosing Weight: 83 kg  Vital Signs: Temp: 97.6 F (36.4 C) (03/13 0759) Temp src: Oral (03/13 0759) BP: 153/78 mmHg (03/13 0759) Pulse Rate: 62 (03/13 0759)  Labs:  Recent Labs  03/18/13 1847  03/19/13 0151 03/19/13 0750  03/20/13 0341 03/20/13 1540 03/21/13 0240  HGB  --   < > 11.3*  --   --  10.6*  --  10.5*  HCT  --   --  36.1  --   --  33.6*  --  33.3*  PLT  --   --  230  --   --  222  --  212  HEPARINUNFRC 0.57  --   --   --   < > 0.21* 0.48 0.30  CREATININE  --   --  1.10  --   --  1.04  --  1.07  TROPONINI <0.30  --  <0.30 <0.30  --   --   --   --   < > = values in this interval not displayed.  Estimated Creatinine Clearance: 58.6 ml/min (by C-G formula based on Cr of 1.07).   Assessment: 68 yo female with chest pain/CHF continues on IV heparin.  Heparin level remains at goal at 0.3 but is on the very lower end of goal range. H/H is low but stable and plts are WNL. No bleeding noted. Per cards, continuing medical management for now.   Goal of Therapy:  Heparin level 0.3-0.7 units/ml Monitor platelets by anticoagulation protocol: Yes   Plan:  1. Increase heparin gtt to 1500 units/hr to ensure she remains within therapeutic range 2. F/u AM heparin level and CBC 3. F/u cardiology plans  Salome Arnt, PharmD, BCPS Pager # [phone] 03/21/2013 8:03 AM

## 2013-03-21 NOTE — Progress Notes (Signed)
Inpatient Diabetes Program Recommendations  AACE/ADA: New Consensus Statement on Inpatient Glycemic Control (2013)  Target Ranges:  Prepandial:   less than 140 mg/dL      Peak postprandial:   less than 180 mg/dL (1-2 hours)      Critically ill patients:  140 - 180 mg/dL   Reason for Assessment:  CBG's still mildly elevated.  Consider adding Levemir 10 units daily (while in the hospital).    Thanks, Adah Perl, RN, BC-ADM Inpatient Diabetes Coordinator Pager [phone]

## 2013-03-21 NOTE — Progress Notes (Addendum)
68 year old female with prior history of coronary artery disease and ischemic cardiomyopathy status post coronary artery bypass grafting in 2012 who presented on transfer from [facility] with chest pain, acute heart failure, and mild troponin elevation.  She has been feeling sick for 1 week. CP, difficulty breathing. Denied fever.  Yesterday when Dr. Acie Fredrickson arrived to see her she had acutely decompensated with fever, chills, profound wheezing. She is + for Influenza B. With Nebs, feeling better.  On IV Heparin     Subjective: better  Objective: Vital signs in last 24 hours: Temp:  [97.5 F (36.4 C)-98 F (36.7 C)] 98 F (36.7 C) (03/13 1227) Pulse Rate:  [56-70] 64 (03/13 1227) Resp:  [14-25] 17 (03/13 1227) BP: (128-158)/(62-85) 158/85 mmHg (03/13 1227) SpO2:  [97 %-100 %] 99 % (03/13 1414) Weight:  [194 lb 0.1 oz (88 kg)] 194 lb 0.1 oz (88 kg) (03/13 0330) Weight change: -15 lb 10.4 oz (-7.1 kg) Last BM Date: 03/17/13 Intake/Output from previous day: 03/12 0701 - 03/13 0700 In: 563.5 [I.V.:563.5] Out: 3300 [Urine:3300] Intake/Output this shift: Total I/O In: 649 [P.O.:600; I.V.:49] Out: 1550 [Urine:1550]  PE:  Per Dr. Acie Fredrickson General:  Vital signs reviewed and noted.   Head:  Normocephalic, atraumatic.   Eyes:  conjunctivae/corneas clear. EOM's intact.   Throat:  normal   Neck:  normal   Lungs:  moderately tight bilateral wjheezing.   Heart:  RR.   Abdomen:  Soft, non-tender, non-distended   Extremities:  1-2+ edema today  Neurologic:  A&O X3, CN II - XII are grossly intact.   Psych:  Normal     Lab Results:  Recent Labs  03/20/13 0341 03/21/13 0240  WBC 3.9* 3.9*  HGB 10.6* 10.5*  HCT 33.6* 33.3*  PLT 222 212   BMET  Recent Labs  03/20/13 0341 03/21/13 0240  NA 138 135*  K 3.4* 4.0  CL 95* 92*  CO2 30 31  GLUCOSE 217* 225*  BUN 22 24*  CREATININE 1.04 1.07  CALCIUM 8.6 8.4    Recent Labs  03/19/13 0151 03/19/13 0750    TROPONINI <0.30 <0.30    Lab Results  Component Value Date   CHOL 114 10/13/2010   HDL 31* 10/13/2010   LDLCALC 64 10/13/2010   TRIG 96 10/13/2010   CHOLHDL 3.7 10/13/2010   Lab Results  Component Value Date   HGBA1C 10.8* 03/18/2013     Lab Results  Component Value Date   TSH 3.001 03/18/2013    Hepatic Function Panel  Recent Labs  03/19/13 0151  PROT 6.4  ALBUMIN 2.6*  AST 26  ALT 26  ALKPHOS 98  BILITOT 0.3   No results found for this basename: CHOL,  in the last 72 hours No results found for this basename: PROTIME,  in the last 72 hours     Studies/Results: 2d Echo: - Left ventricle: The cavity size was moderately dilated. Wall thickness was normal. The estimated ejection fraction was 30%. Diffuse hypokinesis. - Left atrium: The atrium was moderately dilated. - Atrial septum: No defect or patent foramen ovale was identified. - Pulmonary arteries: PA peak pressure: 11m Hg (S). - Impressions: Elevated LA pressure by E/E' Impressions:  - Elevated LA pressure by E/E'    Medications: I have reviewed the patient's current medications. Scheduled Meds: . albuterol  2.5 mg Nebulization TID  . aspirin EC  81 mg Oral Daily  . captopril  6.25 mg Oral TID  .  carvedilol  3.125 mg Oral BID WC  . ciprofloxacin  500 mg Oral BID  . furosemide  80 mg Intravenous BID  . insulin aspart  0-15 Units Subcutaneous TID WC  . insulin aspart  4 Units Subcutaneous TID WC  . multivitamin with minerals  1 tablet Oral Daily  . sodium chloride  3 mL Intravenous Q12H   Continuous Infusions: . heparin 1,500 Units/hr (03/21/13 1038)   PRN Meds:.sodium chloride, morphine injection, ondansetron (ZOFRAN) IV, sodium chloride  Assessment/Plan: Principal Problem:   Acute on chronic combined systolic and diastolic congestive heart failure, NYHA class 4 Active Problems:   Carotid artery disease   Coronary atherosclerosis of native coronary artery   Diabetes mellitus type II,  uncontrolled   Chest pain with moderate risk for cardiac etiology   UTI (urinary tract infection)  PLAN: 1. Respiratory distress: I suspect her recent episode of worsening dyspnea was due to the flu.  2.No evidence of CAD - Troponin levels are negative EKG 03/20/13 with ant changes. Continues on IV Heparin. 3. Chronic systolic CHF: Continue current supportive care. EF 30% doen from 11/2010 Will need to get over the flu before we can accurately assess her. Pro BNP 2927 She is on lasix 80 IV bid.  Diuresing well  4. CAD: Stable, no angina. Troponin is negative.  5. Diabetes uncontrolled Hgb A1c 10.8, glucose 277 to 189 add low dose levemir while in hospital.     LOS: 3 days   Time spent with pt. :15 minutes. [facility] R  Nurse Practitioner Certified Pager [phone] or after 5pm and on weekends call [phone] 03/21/2013, 2:43 PM   Attending Note:   The patient was seen and examined.  Agree with assessment and plan as noted above.  Changes made to the above note as needed. She is still wheezing quite a bit - not nearly as bad as previously.   Thayer Headings, Brooke Bonito., MD, [facility] 03/21/2013, 2:58 PM

## 2013-03-22 DIAGNOSIS — I10 Essential (primary) hypertension: Secondary | ICD-10-CM

## 2013-03-22 LAB — BASIC METABOLIC PANEL
BUN: 23 mg/dL (ref 6–23)
CALCIUM: 8.4 mg/dL (ref 8.4–10.5)
CO2: 32 meq/L (ref 19–32)
CREATININE: 0.95 mg/dL (ref 0.50–1.10)
Chloride: 89 mEq/L — ABNORMAL LOW (ref 96–112)
GFR calc non Af Amer: 62 mL/min — ABNORMAL LOW (ref >90)
GFR, EST AFRICAN AMERICAN: 71 mL/min — AB (ref >90)
Glucose, Bld: 191 mg/dL — ABNORMAL HIGH (ref 70–99)
Potassium: 3.7 mEq/L (ref 3.7–5.3)
Sodium: 131 mEq/L — ABNORMAL LOW (ref 137–147)

## 2013-03-22 LAB — GLUCOSE, CAPILLARY
Glucose-Capillary: 179 mg/dL — ABNORMAL HIGH (ref 70–99)
Glucose-Capillary: 222 mg/dL — ABNORMAL HIGH (ref 70–99)
Glucose-Capillary: 190 mg/dL — ABNORMAL HIGH (ref 70–99)
Glucose-Capillary: 148 mg/dL — ABNORMAL HIGH (ref 70–99)

## 2013-03-22 LAB — CBC
HCT: 35.3 % — ABNORMAL LOW (ref 36.0–46.0)
Hemoglobin: 11.1 g/dL — ABNORMAL LOW (ref 12.0–15.0)
MCH: 26.8 pg (ref 26.0–34.0)
MCHC: 31.4 g/dL (ref 30.0–36.0)
MCV: 85.3 fL (ref 78.0–100.0)
PLATELETS: 191 10*3/uL (ref 150–400)
RBC: 4.14 MIL/uL (ref 3.87–5.11)
RDW: 14.7 % (ref 11.5–15.5)
WBC: 4.1 10*3/uL (ref 4.0–10.5)

## 2013-03-22 LAB — HEPARIN LEVEL (UNFRACTIONATED)
HEPARIN UNFRACTIONATED: 0.23 [IU]/mL — AB (ref 0.30–0.70)
Heparin Unfractionated: 0.29 IU/mL — ABNORMAL LOW (ref 0.30–0.70)
Heparin Unfractionated: 0.48 IU/mL (ref 0.30–0.70)

## 2013-03-22 MED ORDER — HEPARIN (PORCINE) IN NACL 100-0.45 UNIT/ML-% IJ SOLN
1750.0000 [IU]/h | INTRAMUSCULAR | Status: DC
  Administered 2013-03-22 – 2013-03-23 (×3): 1750 [IU]/h via INTRAVENOUS
  Filled 2013-03-22 (×5): qty 250

## 2013-03-22 NOTE — Progress Notes (Signed)
ANTICOAGULATION CONSULT NOTE - Follow Up Consult  Pharmacy Consult for heparin Indication: chest pain/ACS  Labs:  Recent Labs  03/20/13 0341  03/21/13 0240 03/22/13 0350 03/22/13 1156 03/22/13 2203  HGB 10.6*  --  10.5* 11.1*  --   --   HCT 33.6*  --  33.3* 35.3*  --   --   PLT 222  --  212 191  --   --   HEPARINUNFRC 0.21*  < > 0.30 0.29* 0.23* 0.48  CREATININE 1.04  --  1.07 0.95  --   --   < > = values in this interval not displayed.   Assessment/Plan:  68yo female now therapeutic on heparin after rate increases.  Will continue gtt at current rate and confirm stable with am labs.  Wynona Neat, PharmD, BCPS  03/22/2013,10:56 PM

## 2013-03-22 NOTE — Progress Notes (Signed)
SUBJECTIVE:  Complains of SOB  OBJECTIVE:   Vitals:   Filed Vitals:   03/22/13 0412 03/22/13 0800 03/22/13 0939 03/22/13 1000  BP: 149/79 161/62  145/64  Pulse: 57 55  56  Temp: 98.5 F (36.9 C) 98.6 F (37 C)    TempSrc: Oral Oral    Resp: _0 Height:      Weight: 194 lb 0.1 oz (88 kg) 204 lb 12.9 oz (92.9 kg)    SpO2: 100% 98% 100% 96%   I&O's:   Intake/Output Summary (Last 24 hours) at 03/22/13 1224 Last data filed at 03/22/13 1000  Gross per 24 hour  Intake    457 ml  Output   3550 ml  Net  -3093 ml   TELEMETRY: Reviewed telemetry pt in NSR:     PHYSICAL EXAM General: Well developed, well nourished, in no acute distress Head: Eyes PERRLA, No xanthomas.   Normal cephalic and atramatic  Lungs: diffuse wheezes anteriorly and posteriorly Heart:   HRRR S1 S2 Pulses are 2+ & equal Abdomen: Bowel sounds are positive, abdomen soft and non-tender without masses  Extremities:   No clubbing, cyanosis or edema.  DP +1 Neuro: Alert and oriented X 3. Psych:  Good affect, responds appropriately   LABS: Basic Metabolic Panel:  Recent Labs  03/21/13 0240 03/22/13 0350  NA 135* 131*  K 4.0 3.7  CL 92* 89*  CO2 31 32  GLUCOSE 225* 191*  BUN 24* 23  CREATININE 1.07 0.95  CALCIUM 8.4 8.4   Liver Function Tests: No results found for this basename: AST, ALT, ALKPHOS, BILITOT, PROT, ALBUMIN,  in the last 72 hours No results found for this basename: LIPASE, AMYLASE,  in the last 72 hours CBC:  Recent Labs  03/21/13 0240 03/22/13 0350  WBC 3.9* 4.1  HGB 10.5* 11.1*  HCT 33.3* 35.3*  MCV 86.5 85.3  PLT 212 191   Cardiac Enzymes: No results found for this basename: CKTOTAL, CKMB, CKMBINDEX, TROPONINI,  in the last 72 hours BNP: No components found with this basename: POCBNP,  D-Dimer: No results found for this basename: DDIMER,  in the last 72 hours Hemoglobin A1C: No results found for this basename: HGBA1C,  in the last 72 hours Fasting Lipid  Panel: No results found for this basename: CHOL, HDL, LDLCALC, TRIG, CHOLHDL, LDLDIRECT,  in the last 72 hours Thyroid Function Tests: No results found for this basename: TSH, T4TOTAL, FREET3, T3FREE, THYROIDAB,  in the last 72 hours Anemia Panel: No results found for this basename: VITAMINB12, FOLATE, FERRITIN, TIBC, IRON, RETICCTPCT,  in the last 72 hours Coag Panel:   Lab Results  Component Value Date   INR 1.44 11/24/2010   INR 1.04 11/22/2010   INR 1.09 10/13/2010    RADIOLOGY: No results found.  Assessment/Plan:  Principal Problem:  Acute on chronic combined systolic and diastolic congestive heart failure, NYHA class 4  Active Problems:  Carotid artery disease  Coronary atherosclerosis of native coronary artery  Diabetes mellitus type II, uncontrolled  Chest pain with moderate risk for cardiac etiology  UTI (urinary tract infection)   PLAN: 1. Respiratory distress: I suspect her recent episode of worsening dyspnea was due to the flu.  2. No evidence of CAD - Troponin levels are negative EKG 03/20/13 with ant changes.  Continues on IV Heparin.  3. Chronic systolic CHF: Continue current supportive care. EF 30% done from 11/2010  Will need to get over the flu before we can accurately  assess her. Pro BNP 2927  She is on lasix 80 IV bid. Diuresing well -8L 4. CAD: Stable, no angina. Troponin is negative.  5. Diabetes uncontrolled Hgb A1c 10.8, glucose 277 to 189 add low dose levemir while in hospital.      Sueanne Margarita, MD  03/22/2013  12:24 PM

## 2013-03-22 NOTE — Progress Notes (Signed)
ANTICOAGULATION CONSULT NOTE - Follow Up Consult  Pharmacy Consult for heparin Indication: chest pain/ACS  Labs:  Recent Labs  03/19/13 0750  03/20/13 0341 03/20/13 1540 03/21/13 0240 03/22/13 0350  HGB  --   < > 10.6*  --  10.5* 11.1*  HCT  --   --  33.6*  --  33.3* 35.3*  PLT  --   --  222  --  212 191  HEPARINUNFRC  --   < > 0.21* 0.48 0.30 0.29*  CREATININE  --   --  1.04  --  1.07 0.95  TROPONINI <0.30  --   --   --   --   --   < > = values in this interval not displayed.   Assessment: 68yo female now subtherapeutic on heparin despite rate increase yesterday when at very low end of goal.  Goal of Therapy:  Heparin level 0.3-0.7 units/ml   Plan:  Will increase heparin gtt slightly to 1600 units/hr and check level in 6hr.  Wynona Neat, PharmD, BCPS  03/22/2013,5:07 AM

## 2013-03-22 NOTE — Progress Notes (Signed)
[place] for heparin  Indication: chest pain/ACS  Allergies  Allergen Reactions  . Other Shortness Of Breath and Rash    All berries   . Strawberry Hives, Shortness Of Breath and Rash  . Sulfa Antibiotics Swelling    Patient Measurements: Height: 5' 6" (167.6 cm) Weight: 204 lb 12.9 oz (92.9 kg) IBW/kg (Calculated) : 59.3 Heparin Dosing Weight: 83 kg  Vital Signs: Temp: 98.6 F (37 C) (03/14 0800) Temp src: Oral (03/14 0800) BP: 145/64 mmHg (03/14 1000) Pulse Rate: 56 (03/14 1000)  Labs:  Recent Labs  03/20/13 0341  03/21/13 0240 03/22/13 0350 03/22/13 1156  HGB 10.6*  --  10.5* 11.1*  --   HCT 33.6*  --  33.3* 35.3*  --   PLT 222  --  212 191  --   HEPARINUNFRC 0.21*  < > 0.30 0.29* 0.23*  CREATININE 1.04  --  1.07 0.95  --   < > = values in this interval not displayed.  Estimated Creatinine Clearance: 67.8 ml/min (by C-G formula based on Cr of 0.95).   Assessment: 68 yo female with chest pain/CHF continues on IV heparin. Heparin level came back subtherapeutic at 0.23. Nurse reported no problems with the IV or s/s of bleeding. Hg 11.1, platelets 191.  Goal of Therapy:  Heparin level 0.3-0.7 units/ml Monitor platelets by anticoagulation protocol: Yes   Plan:  - Increase heparin IV to 1750units/hr - Obtain heparin level at 2000 - Monitor daily CBC, HL, and s/s bleeding  Hiro Vipond A. Pincus Badder, PharmD Clinical Pharmacist - Resident Pager: [phone] Pharmacy: [phone] 03/22/2013 1:36 PM

## 2013-03-23 LAB — GLUCOSE, CAPILLARY
GLUCOSE-CAPILLARY: 168 mg/dL — AB (ref 70–99)
GLUCOSE-CAPILLARY: 250 mg/dL — AB (ref 70–99)
GLUCOSE-CAPILLARY: 238 mg/dL — AB (ref 70–99)

## 2013-03-23 LAB — CBC
HEMATOCRIT: 35.6 % — AB (ref 36.0–46.0)
Hemoglobin: 11.5 g/dL — ABNORMAL LOW (ref 12.0–15.0)
MCH: 27.3 pg (ref 26.0–34.0)
MCHC: 32.3 g/dL (ref 30.0–36.0)
MCV: 84.6 fL (ref 78.0–100.0)
PLATELETS: 191 10*3/uL (ref 150–400)
RBC: 4.21 MIL/uL (ref 3.87–5.11)
RDW: 14.6 % (ref 11.5–15.5)
WBC: 4.2 10*3/uL (ref 4.0–10.5)

## 2013-03-23 LAB — HEPARIN LEVEL (UNFRACTIONATED): Heparin Unfractionated: 0.47 IU/mL (ref 0.30–0.70)

## 2013-03-23 MED ORDER — CAPTOPRIL 6.25 MG HALF TABLET
6.2500 mg | ORAL_TABLET | Freq: Three times a day (TID) | ORAL | Status: DC
  Administered 2013-03-23: 22:00:00 via ORAL
  Administered 2013-03-23 – 2013-03-24 (×4): 6.25 mg via ORAL
  Filled 2013-03-23 (×7): qty 1

## 2013-03-23 NOTE — Progress Notes (Signed)
Pt's bp at 0235 was 180/78, pt c/o of the sternal/epigastric pain that she has been experiencing this admission at 8/10.  Morphine 32m prn was given at 0240 with good results(2/10) and pt's bp at 0311 was 161/59.  At 0600 her bp was elevated to 181/73 with no increase in pain. MD was notified of same and adjusted dose times of Captopril 6.241mand she received the am dose at 0657.  Information passed on to day RN to monitor bp response to medication.

## 2013-03-23 NOTE — Progress Notes (Signed)
SUBJECTIVE:  Breathing better  OBJECTIVE:   Vitals:   Filed Vitals:   03/23/13 0436 03/23/13 0657 03/23/13 0724 03/23/13 0800  BP: 164/68 181/73  164/92  Pulse: 57   57  Temp: 97.9 F (36.6 C)   98 F (36.7 C)  TempSrc: Oral   Oral  Resp: 11   14  Height:      Weight:      SpO2: 98%  93% 98%   I&O's:   Intake/Output Summary (Last 24 hours) at 03/23/13 1115 Last data filed at 03/23/13 0856  Gross per 24 hour  Intake    622 ml  Output   3350 ml  Net  -2728 ml   TELEMETRY: Reviewed telemetry pt in NSR:     PHYSICAL EXAM General: Well developed, well nourished, in no acute distress Head: Eyes PERRLA, No xanthomas.   Normal cephalic and atramatic  Lungs:   Coarse BS throughout with wheezing Heart:   HRRR S1 S2 Pulses are 2+ & equal. Abdomen: Bowel sounds are positive, abdomen soft and non-tender without masses  Extremities:   No clubbing, cyanosis or edema.  DP +1 Neuro: Alert and oriented X 3. Psych:  Good affect, responds appropriately   LABS: Basic Metabolic Panel:  Recent Labs  03/21/13 0240 03/22/13 0350  NA 135* 131*  K 4.0 3.7  CL 92* 89*  CO2 31 32  GLUCOSE 225* 191*  BUN 24* 23  CREATININE 1.07 0.95  CALCIUM 8.4 8.4   Liver Function Tests: No results found for this basename: AST, ALT, ALKPHOS, BILITOT, PROT, ALBUMIN,  in the last 72 hours No results found for this basename: LIPASE, AMYLASE,  in the last 72 hours CBC:  Recent Labs  03/21/13 0240 03/22/13 0350  WBC 3.9* 4.1  HGB 10.5* 11.1*  HCT 33.3* 35.3*  MCV 86.5 85.3  PLT 212 191   Cardiac Enzymes: No results found for this basename: CKTOTAL, CKMB, CKMBINDEX, TROPONINI,  in the last 72 hours BNP: No components found with this basename: POCBNP,  D-Dimer: No results found for this basename: DDIMER,  in the last 72 hours Hemoglobin A1C: No results found for this basename: HGBA1C,  in the last 72 hours Fasting Lipid Panel: No results found for this basename: CHOL, HDL, LDLCALC,  TRIG, CHOLHDL, LDLDIRECT,  in the last 72 hours Thyroid Function Tests: No results found for this basename: TSH, T4TOTAL, FREET3, T3FREE, THYROIDAB,  in the last 72 hours Anemia Panel: No results found for this basename: VITAMINB12, FOLATE, FERRITIN, TIBC, IRON, RETICCTPCT,  in the last 72 hours Coag Panel:   Lab Results  Component Value Date   INR 1.44 11/24/2010   INR 1.04 11/22/2010   INR 1.09 10/13/2010    RADIOLOGY: No results found.  Assessment/Plan:  Principal Problem:  Acute on chronic combined systolic and diastolic congestive heart failure, NYHA class 4  Active Problems:  Carotid artery disease  Coronary atherosclerosis of native coronary artery  Diabetes mellitus type II, uncontrolled  Chest pain with moderate risk for cardiac etiology  UTI (urinary tract infection)   PLAN:  1. Respiratory distress: I suspect her recent episode of worsening dyspnea was due to the flu.  2. No evidence of CAD - Troponin levels are negative EKG 03/20/13 with ant changes.  Continues on IV Heparin.  3. Chronic systolic CHF: Continue current supportive care. EF 30% down from 11/2010  Will need to get over the flu before we can accurately assess her. Pro BNP 2927  She is  on lasix 80 IV bid. Diuresing well -11L  4. CAD: Stable, no angina. Troponin is negative.  5. Diabetes uncontrolled Hgb A1c 10.8, glucose 277 to 189 add low dose levemir while in hospital.    [name] Margarita, MD  03/23/2013  11:15 AM

## 2013-03-23 NOTE — Progress Notes (Signed)
[place] for heparin  Indication: chest pain/ACS  Allergies  Allergen Reactions  . Other Shortness Of Breath and Rash    All berries   . Strawberry Hives, Shortness Of Breath and Rash  . Sulfa Antibiotics Swelling    Patient Measurements: Height: 5' 6" (167.6 cm) Weight: 204 lb 12.9 oz (92.9 kg) IBW/kg (Calculated) : 59.3 Heparin Dosing Weight: 83 kg  Vital Signs: Temp: 98 F (36.7 C) (03/15 0800) Temp src: Oral (03/15 0800) BP: 164/92 mmHg (03/15 0800) Pulse Rate: 57 (03/15 0800)  Labs:  Recent Labs  03/21/13 0240 03/22/13 0350 03/22/13 1156 03/22/13 2203 03/23/13 0915  HGB 10.5* 11.1*  --   --   --   HCT 33.3* 35.3*  --   --   --   PLT 212 191  --   --   --   HEPARINUNFRC 0.30 0.29* 0.23* 0.48 0.47  CREATININE 1.07 0.95  --   --   --     Estimated Creatinine Clearance: 67.8 ml/min (by C-G formula based on Cr of 0.95).   Assessment: 68 yo female with chest pain/CHF continues on IV heparin. Heparin level has been therapeutic x 2 with last level of 0.47. No s/s of bleeding noted. Hg 11.1, platelets 191.  Goal of Therapy:  Heparin level 0.3-0.7 units/ml Monitor platelets by anticoagulation protocol: Yes   Plan:  - Continue heparin IV 1750units/hr - Monitor daily CBC, HL, and s/s bleeding  Randy Whitener A. Pincus Badder, PharmD Clinical Pharmacist - Resident Pager: [phone] Pharmacy: [phone] 03/23/2013 11:26 AM

## 2013-03-24 DIAGNOSIS — J101 Influenza due to other identified influenza virus with other respiratory manifestations: Secondary | ICD-10-CM | POA: Diagnosis present

## 2013-03-24 DIAGNOSIS — N390 Urinary tract infection, site not specified: Secondary | ICD-10-CM

## 2013-03-24 DIAGNOSIS — E1165 Type 2 diabetes mellitus with hyperglycemia: Secondary | ICD-10-CM

## 2013-03-24 DIAGNOSIS — IMO0001 Reserved for inherently not codable concepts without codable children: Secondary | ICD-10-CM

## 2013-03-24 LAB — BASIC METABOLIC PANEL
BUN: 19 mg/dL (ref 6–23)
CO2: 37 mEq/L — ABNORMAL HIGH (ref 19–32)
CREATININE: 0.9 mg/dL (ref 0.50–1.10)
Calcium: 8.9 mg/dL (ref 8.4–10.5)
Chloride: 92 mEq/L — ABNORMAL LOW (ref 96–112)
GFR calc Af Amer: 76 mL/min — ABNORMAL LOW (ref >90)
GFR, EST NON AFRICAN AMERICAN: 66 mL/min — AB (ref >90)
Glucose, Bld: 165 mg/dL — ABNORMAL HIGH (ref 70–99)
Potassium: 3.3 mEq/L — ABNORMAL LOW (ref 3.7–5.3)
Sodium: 139 mEq/L (ref 137–147)

## 2013-03-24 LAB — CBC
HEMATOCRIT: 36.6 % (ref 36.0–46.0)
Hemoglobin: 11.6 g/dL — ABNORMAL LOW (ref 12.0–15.0)
MCH: 26.7 pg (ref 26.0–34.0)
MCHC: 31.7 g/dL (ref 30.0–36.0)
MCV: 84.1 fL (ref 78.0–100.0)
PLATELETS: 217 10*3/uL (ref 150–400)
RBC: 4.35 MIL/uL (ref 3.87–5.11)
RDW: 14.4 % (ref 11.5–15.5)
WBC: 5 10*3/uL (ref 4.0–10.5)

## 2013-03-24 LAB — GLUCOSE, CAPILLARY
GLUCOSE-CAPILLARY: 182 mg/dL — AB (ref 70–99)
Glucose-Capillary: 159 mg/dL — ABNORMAL HIGH (ref 70–99)
Glucose-Capillary: 242 mg/dL — ABNORMAL HIGH (ref 70–99)
Glucose-Capillary: 239 mg/dL — ABNORMAL HIGH (ref 70–99)

## 2013-03-24 LAB — HEPARIN LEVEL (UNFRACTIONATED): HEPARIN UNFRACTIONATED: 0.43 [IU]/mL (ref 0.30–0.70)

## 2013-03-24 MED ORDER — INSULIN DETEMIR 100 UNIT/ML ~~LOC~~ SOLN
5.0000 [IU] | Freq: Every day | SUBCUTANEOUS | Status: DC
  Administered 2013-03-24: 5 [IU] via SUBCUTANEOUS
  Filled 2013-03-24: qty 0.05

## 2013-03-24 MED ORDER — CAPTOPRIL 12.5 MG PO TABS
12.5000 mg | ORAL_TABLET | Freq: Three times a day (TID) | ORAL | Status: DC
  Administered 2013-03-24 – 2013-03-27 (×11): 12.5 mg via ORAL
  Filled 2013-03-24 (×12): qty 1

## 2013-03-24 MED ORDER — HEPARIN SODIUM (PORCINE) 5000 UNIT/ML IJ SOLN
5000.0000 [IU] | Freq: Three times a day (TID) | INTRAMUSCULAR | Status: DC
  Administered 2013-03-24 – 2013-03-27 (×10): 5000 [IU] via SUBCUTANEOUS
  Filled 2013-03-24 (×13): qty 1

## 2013-03-24 MED ORDER — FUROSEMIDE 10 MG/ML IJ SOLN
60.0000 mg | Freq: Two times a day (BID) | INTRAMUSCULAR | Status: DC
  Administered 2013-03-24 – 2013-03-25 (×4): 60 mg via INTRAVENOUS
  Filled 2013-03-24 (×4): qty 6

## 2013-03-24 NOTE — Progress Notes (Addendum)
Inpatient Diabetes Program Recommendations  AACE/ADA: New Consensus Statement on Inpatient Glycemic Control (2013)  Target Ranges:  Prepandial:   less than 140 mg/dL      Peak postprandial:   less than 180 mg/dL (1-2 hours)      Critically ill patients:  140 - 180 mg/dL   Reason for Visit: Results for CHERAL, CAPPUCCI (MRN [id number]) as of 03/24/2013 10:17  Ref. Range 03/22/2013 22:08 03/23/2013 08:28 03/23/2013 12:17 03/23/2013 16:48 03/24/2013 08:27  Glucose-Capillary Latest Range: 70-99 mg/dL 148 (H) 168 (H) 250 (H) 238 (H) 159 (H)   Diabetes history: Type 2 Outpatient Diabetes medications: None Current orders for Inpatient glycemic control:  Levemir 5 units daily, and Novolog moderate tid with meals  Per previous conversation with Diabetes coordinator, patient had not been taking any medications for diabetes due to inability to afford co-pays for insulins.  She does have Part D coverage but still complains that the co-pays are too high.  She states that she cannot see the lines on an insulin syringe.  Per previous recommendations, may consider adding Metformin 500 mg bid (with titration) and Amaryl 4 mg daily.  Both of these medications are available at a 4$ co-pay at [facility].  Will need close follow-up with PCP after discharge for management of diabetes.    While in the hospital consider increasing Levemir to 10 units daily and Meal coverage Novolog 3 units tid with meals.    Adah Perl, RN, BC-ADM Inpatient Diabetes Coordinator Pager [phone]

## 2013-03-24 NOTE — Evaluation (Signed)
Physical Therapy Evaluation Patient Details Name: [name] [name] MRN: [id number] DOB: January 26, 1945 Today's Date: 03/24/2013 Time: 8299-3716 PT Time Calculation (min): 21 min  PT Assessment / Plan / Recommendation History of Present Illness  68 year old female with prior history of coronary artery disease and ischemic cardiomyopathy status post coronary artery bypass grafting in 2012 who presented on transfer from [place] with chest pain, acute heart failure, and mild troponin elevation. She has been feeling sick for 1 week. CP, difficulty breathing. Denied fever. CHF and flu (+)  Clinical Impression  Pt with steady gait and sats 89-95% on RA throughout mobility. Pt with difficulty with standing during 2 trials in room with assist to complete transfer.  Pt lives alone and has all necessary equipment for return home. Pt will benefit from acute therapy to maximize mobility, transfers and gait in order to return to PLOF. Recommend OOB daily with nursing.     PT Assessment  Patient needs continued PT services    Follow Up Recommendations  No PT follow up    Does the patient have the potential to tolerate intense rehabilitation      Barriers to Discharge        Equipment Recommendations  None recommended by PT    Recommendations for Other Services     Frequency Min 3X/week    Precautions / Restrictions Precautions Precautions: Fall   Pertinent Vitals/Pain No pain End of session 96% on 1L      Mobility  Bed Mobility General bed mobility comments: pt EOB on arrival Transfers Overall transfer level: Needs assistance Equipment used: Rolling walker (2 wheeled);None Transfers: Sit to/from Stand Sit to Stand: Min assist General transfer comment: cueing for hand placement, transfers and safety with assist for anterior translation and elevation from surface Ambulation/Gait Ambulation/Gait assistance: Supervision Ambulation Distance (Feet): 250 Feet Assistive device: Rolling  walker (2 wheeled);None Gait Pattern/deviations: Step-through pattern Gait velocity interpretation: Below normal speed for age/gender General Gait Details: pt walked 200' with Rw with cues to step into Rw and without RW smooth gait without LOB    Exercises     PT Diagnosis: Difficulty walking  PT Problem List: Decreased activity tolerance;Decreased mobility;Decreased knowledge of use of DME;Decreased safety awareness PT Treatment Interventions: Gait training;Functional mobility training;Therapeutic activities;Therapeutic exercise;Patient/family education;DME instruction     PT Goals(Current goals can be found in the care plan section) Acute Rehab PT Goals Patient Stated Goal: return home to help care for my granddgtr PT Goal Formulation: With patient Time For Goal Achievement: 04/07/13 Potential to Achieve Goals: Good  Visit Information  Last PT Received On: 03/24/13 Assistance Needed: +1 History of Present Illness: 68 year old female with prior history of coronary artery disease and ischemic cardiomyopathy status post coronary artery bypass grafting in 2012 who presented on transfer from [place] with chest pain, acute heart failure, and mild troponin elevation. She has been feeling sick for 1 week. CP, difficulty breathing. Denied fever. CHF and flu (+)       Prior [place] expects to be discharged to:: Private residence Living Arrangements: Alone Available Help at Discharge: Family;Available PRN/intermittently Type of Home: Apartment Home Access: Level entry Home Layout: One level Home Equipment: Walker - 2 wheels;Walker - 4 wheels;Cane - single point;Shower seat;Bedside commode Prior Function Level of Independence: Independent with assistive device(s) Comments: pt states she only uses her cane at times and uses her rollator when doing the laundry or taking out the trash Communication Communication: No difficulties  Cognition   Cognition Arousal/Alertness: Awake/alert Behavior During Therapy: WFL for tasks assessed/performed Overall Cognitive Status: Within Functional Limits for tasks assessed    Extremity/Trunk Assessment Upper Extremity Assessment Upper Extremity Assessment: Overall WFL for tasks assessed Lower Extremity Assessment Lower Extremity Assessment: Overall WFL for tasks assessed Cervical / Trunk Assessment Cervical / Trunk Assessment: Normal   Balance    End of Session PT - End of Session Equipment Utilized During Treatment: Gait belt Activity Tolerance: Patient tolerated treatment well Patient left: in chair;with call bell/phone within reach Nurse Communication: Mobility status  GP     Melford Aase 03/24/2013, 2:33 PM Elwyn Reach, [place]

## 2013-03-24 NOTE — Progress Notes (Signed)
Patient ID: [name] [name], female   DOB: Sep 24, 1945, 68 y.o.   MRN: [id number]  68 year old female with prior history of coronary artery disease and ischemic cardiomyopathy status post coronary artery bypass grafting in 2012 who presented on transfer from [place] with chest pain, acute heart failure, and mild troponin elevation. She has been feeling sick for 1 week. CP, difficulty breathing. Denied fever.  3/12 -- acutely decompensated with fever, chills, profound wheezing. She is + for Influenza B. With Nebs, feeling better.  On IV Heparin since admission - CE now negative & CP more MSK related. On IV Diuresis.  SUBJECTIVE:  Breathing better  OBJECTIVE:   Vitals:   Filed Vitals:   03/23/13 2209 03/24/13 0000 03/24/13 0416 03/24/13 0722  BP: 174/75  165/85   Pulse:   63   Temp:  97.6 F (36.4 C) 97.2 F (36.2 C)   TempSrc:  Oral Oral   Resp:   18   Height:      Weight:   193 lb 5.5 oz (87.7 kg)   SpO2:   93% 98%   I&O's:    Intake/Output Summary (Last 24 hours) at 03/24/13 7846 Last data filed at 03/24/13 0700  Gross per 24 hour  Intake   1273 ml  Output   4675 ml  Net  -3402 ml   TELEMETRY: Reviewed telemetry pt in NSR:  PHYSICAL EXAM General: Well developed, well nourished, in no acute distress Head: Eyes PERRLA, No xanthomas.   Normal cephalic and atramatic  Lungs:   Coarse rhonchorous BS throughout with wheezing; does not sound like rales. Heart:   HRRR S1 S2 Pulses are 2+ & equal. Abdomen: Bowel sounds are positive, abdomen soft and non-tender without masses  Extremities:   No clubbing, cyanosis; 2+ LE edema.  DP +1 Neuro: Alert and oriented X 3.  Psych:  Good affect, responds appropriately  LABS: Basic Metabolic Panel:  Recent Labs  03/22/13 0350 03/24/13 0310  NA 131* 139  K 3.7 3.3*  CL 89* 92*  CO2 32 37*  GLUCOSE 191* 165*  BUN 23 19  CREATININE 0.95 0.90  CALCIUM 8.4 8.9   Liver Function Tests: No results found for this basename:  AST, ALT, ALKPHOS, BILITOT, PROT, ALBUMIN,  in the last 72 hours No results found for this basename: LIPASE, AMYLASE,  in the last 72 hours CBC:  Recent Labs  03/23/13 1532 03/24/13 0310  WBC 4.2 5.0  HGB 11.5* 11.6*  HCT 35.6* 36.6  MCV 84.6 84.1  PLT 191 217   BNP: No components found with this basename: POCBNP,   Anemia Panel: No results found for this basename: VITAMINB12, FOLATE, FERRITIN, TIBC, IRON, RETICCTPCT,  in the last 72 hours  RADIOLOGY: No results found.  Assessment/Plan:  Principal Problem:   Acute on chronic combined systolic and diastolic congestive heart failure, NYHA class 4 - EF 30%; h/o CABG Active Problems:   Diabetes mellitus type II, uncontrolled   Influenza B   Coronary atherosclerosis of native coronary artery   Chest pain with moderate risk for cardiac etiology - mild troponin bump initially (likely related to CHF);   UTI (urinary tract infection) - on day 6 of Cipro    Carotid artery disease  PLAN:  1. Respiratory distress:  worsening dyspnea was due to the Influenza complicating A on C combined HF..  2. CAD: Troponin levels are negative EKG 03/20/13 with ant changes. Stable, no angina.  Will d/c IV Heparin. 3. Chronic systolic  CHF: Continue current supportive care. EF 30% down from 11/2010.  Pro BNP 2927   Will need to get over the flu before we can accurately assess her.  Would need Cardiac Cath once more stable. (perhaps even as OP)  She is on lasix 80 IV bid. Diuresing well -14L; can decrease to 60 mg IV BID; foley in place  Will increase Captopril to 12.5 mg tid for additional afterload reduction. 4. Diabetes uncontrolled Hgb A1c 10.8, glucose 277 to 189 - plan had been adding low dose Levimer while inpt, but not ordered.  On SSI.  Will c/s DM Education Svc for recommendations. Start with 5 mg Levimer QHS 5.  Complete 5 day course of Cipro for UTI. D/c today (no further dysuria) 6.  Influenza - (had flu shot shortly before admission);  ? Outside of window for Tamiflu.  On nebs -- with persistent abnormal lung exam, will recheck CXR in AM  PT to eval & Rx - try to get her up & walking.     Leonie Man, MD  03/24/2013  7:38 AM

## 2013-03-25 ENCOUNTER — Inpatient Hospital Stay (HOSPITAL_COMMUNITY): Payer: Medicare Other

## 2013-03-25 DIAGNOSIS — E782 Mixed hyperlipidemia: Secondary | ICD-10-CM

## 2013-03-25 LAB — GLUCOSE, CAPILLARY
GLUCOSE-CAPILLARY: 130 mg/dL — AB (ref 70–99)
GLUCOSE-CAPILLARY: 217 mg/dL — AB (ref 70–99)
GLUCOSE-CAPILLARY: 218 mg/dL — AB (ref 70–99)
GLUCOSE-CAPILLARY: 264 mg/dL — AB (ref 70–99)

## 2013-03-25 LAB — BASIC METABOLIC PANEL
BUN: 17 mg/dL (ref 6–23)
CHLORIDE: 92 meq/L — AB (ref 96–112)
CO2: 35 meq/L — AB (ref 19–32)
Calcium: 9.1 mg/dL (ref 8.4–10.5)
Creatinine, Ser: 0.85 mg/dL (ref 0.50–1.10)
GFR calc Af Amer: 82 mL/min — ABNORMAL LOW (ref >90)
GFR calc non Af Amer: 70 mL/min — ABNORMAL LOW (ref >90)
GLUCOSE: 132 mg/dL — AB (ref 70–99)
Potassium: 3.4 mEq/L — ABNORMAL LOW (ref 3.7–5.3)
SODIUM: 139 meq/L (ref 137–147)

## 2013-03-25 MED ORDER — INSULIN ASPART 100 UNIT/ML ~~LOC~~ SOLN
3.0000 [IU] | Freq: Three times a day (TID) | SUBCUTANEOUS | Status: DC
  Administered 2013-03-25 – 2013-03-26 (×5): 3 [IU] via SUBCUTANEOUS
  Administered 2013-03-26: 4 [IU] via SUBCUTANEOUS
  Administered 2013-03-27 – 2013-03-29 (×4): 3 [IU] via SUBCUTANEOUS

## 2013-03-25 MED ORDER — INSULIN DETEMIR 100 UNIT/ML ~~LOC~~ SOLN
10.0000 [IU] | Freq: Every day | SUBCUTANEOUS | Status: DC
  Administered 2013-03-25 – 2013-03-28 (×4): 10 [IU] via SUBCUTANEOUS
  Filled 2013-03-25 (×5): qty 0.1

## 2013-03-25 MED ORDER — CARVEDILOL 6.25 MG PO TABS
6.2500 mg | ORAL_TABLET | Freq: Two times a day (BID) | ORAL | Status: DC
  Administered 2013-03-25 – 2013-03-29 (×8): 6.25 mg via ORAL
  Filled 2013-03-25 (×3): qty 1
  Filled 2013-03-25: qty 2
  Filled 2013-03-25 (×7): qty 1

## 2013-03-25 NOTE — Progress Notes (Signed)
Patient ID: [name] [name], female   DOB: 12/17/1945, 68 y.o.   MRN: [id number]  67 year old female with prior history of coronary artery disease and ischemic cardiomyopathy status post coronary artery bypass grafting in 2012 who presented on transfer from [place] with chest pain, acute heart failure, and mild troponin elevation. She has been feeling sick for 1 week. CP, difficulty breathing. Denied fever.  3/12 -- acutely decompensated with fever, chills, profound wheezing. She is + for Influenza B. With Nebs, feeling better.  On IV Heparin since admission - CE now negative & CP more MSK related. On IV Diuresis.  SUBJECTIVE:  Breathing better; walked in hallway today w/o O2 & maintained SaO2.   Coughing less.  No F/C,.  OBJECTIVE:   Vitals:   Filed Vitals:   03/25/13 0610 03/25/13 0719 03/25/13 0812 03/25/13 0919  BP: 191/72  174/58 174/58  Pulse:   78   Temp:   97.6 F (36.4 C)   TempSrc:   Oral   Resp:   26   Height:      Weight:      SpO2:  97% 100%    I&O's:    Intake/Output Summary (Last 24 hours) at 03/25/13 0949 Last data filed at 03/25/13 4098  Gross per 24 hour  Intake  815.5 ml  Output   3600 ml  Net -2784.5 ml   TELEMETRY: Reviewed telemetry pt in NSR:  PHYSICAL EXAM General: Well developed, well nourished, in no acute distress Head: Eyes PERRLA, No xanthomas.   Normal cephalic and atramatic  Lungs:   Bibasilar dullness to percussion with diminished BS; exp wheezing with mild rhonchi. Heart:   HRRR S1 S2 Pulses are 2+ & equal. Abdomen: Bowel sounds are positive, abdomen soft and non-tender without masses  Extremities:   No clubbing, cyanosis; 1-2+ LE edema.  DP +1 Neuro: Alert and oriented X 3.  Psych:  Good affect, responds appropriately  LABS: Basic Metabolic Panel:  Recent Labs  03/24/13 0310 03/25/13 0354  NA 139 139  K 3.3* 3.4*  CL 92* 92*  CO2 37* 35*  GLUCOSE 165* 132*  BUN 19 17  CREATININE 0.90 0.85  CALCIUM 8.9 9.1   Liver  Function Tests: No results found for this basename: AST, ALT, ALKPHOS, BILITOT, PROT, ALBUMIN,  in the last 72 hours No results found for this basename: LIPASE, AMYLASE,  in the last 72 hours CBC:  Recent Labs  03/23/13 1532 03/24/13 0310  WBC 4.2 5.0  HGB 11.5* 11.6*  HCT 35.6* 36.6  MCV 84.6 84.1  PLT 191 217   BNP: No components found with this basename: POCBNP,   Anemia Panel: No results found for this basename: VITAMINB12, FOLATE, FERRITIN, TIBC, IRON, RETICCTPCT,  in the last 72 hours  RADIOLOGY: No results found.  Assessment/Plan:  Principal Problem:   Acute on chronic combined systolic and diastolic congestive heart failure, NYHA class 4 - EF 30%; h/o CABG Active Problems:   Diabetes mellitus type II, uncontrolled   Influenza B   Coronary atherosclerosis of native coronary artery   Chest pain with moderate risk for cardiac etiology - mild troponin bump initially (likely related to CHF);   UTI (urinary tract infection) - on day 6 of Cipro    Carotid artery disease  PLAN:  1. Respiratory distress:  worsening dyspnea was due to the Influenza complicating A on C combined HF..  2. CAD: Troponin levels are negative EKG 03/20/13 with ant changes. Stable, no angina.  Will d/c IV Heparin. 3. Chronic systolic CHF: Continue current supportive care. EF 30% down from 11/2010.  Pro BNP 2927   Probably close to resolved Flu Sx --> Would need Cardiac Cath once more stable.  Renal Fxn is stable  At this point, I think she is relatively stable & should be OK for R&LHC tomorrow to investigate Drop in EF & to determine need for additional diuresis.  She is on lasix 80 IV bid. Diuresing well -14L; can decrease to 60 mg IV BID; foley in place (hold PM lasix dose today & AM dose tomorrow pre-cath).  BP remains high - increased Captopril to 12.5 mg tid for additional afterload reduction yesterday -- will increase Coreg today. 4. Diabetes uncontrolled Hgb A1c 10.8, glucose 277 to 189 -  plan had been adding low dose Levimer while inpt, but not ordered.  On SSI.  Will c/s DM Education Svc -- rec'd increase to 10 mg Levimer QHS & add  4 of Novolog QAC. 5.  Completed 5 day course of Cipro for UTI. D/c today (no further dysuria) 6.  Influenza - (had flu shot shortly before admission); ? Outside of window for Tamiflu.  On nebs -- with persistent abnormal lung exam, will recheck CXR in AM  PT to eval & Rx - try to get her up & walking.  Indication for CATH -- New onset Cardiomyopathy, known CAD-CABG, Mild Troponin elevation - thought to be Type 2 MI (but with concomitant CP, cannot exclude new ischemic CAD).   Leonie Man, MD  03/25/2013  9:49 AM

## 2013-03-25 NOTE — Progress Notes (Signed)
SATURATION QUALIFICATIONS: (This note is used to comply with regulatory documentation for home oxygen)  Patient Saturations on Room Air at Rest = 92%  Patient Saturations on Room Air while Ambulating = 85%  Patient Saturations on 2 Liters of oxygen while Ambulating = 92%  Please briefly explain why patient needs home oxygen:Pt desats with ambulation without O2 and sats >90% with O2 with ambulation.   Thanks. [facility] [phone] (pager)

## 2013-03-25 NOTE — Progress Notes (Signed)
Physical Therapy Treatment Patient Details Name: [name] [name] MRN: [id number] DOB: December 26, 1945 Today's Date: 03/25/2013 Time: 1010-1038 PT Time Calculation (min): 28 min  PT Assessment / Plan / Recommendation  History of Present Illness 68 year old female with prior history of coronary artery disease and ischemic cardiomyopathy status post coronary artery bypass grafting in 2012 who presented on transfer from [place] with chest pain, acute heart failure, and mild troponin elevation. She has been feeling sick for 1 week. CP, difficulty breathing. Denied fever. CHF and flu (+)   PT Comments   Pt admitted with above. Pt currently with functional limitations due to endurance deficits.  Pt will benefit from skilled PT to increase their independence and safety with mobility to allow discharge to the venue listed below.   Follow Up Recommendations  No PT follow up     Does the patient have the potential to tolerate intense rehabilitation     Barriers to Discharge        Equipment Recommendations  None recommended by PT    Recommendations for Other Services    Frequency Min 3X/week   Progress towards PT Goals Progress towards PT goals: Progressing toward goals  Plan Current plan remains appropriate    Precautions / Restrictions Precautions Precautions: Fall Restrictions Weight Bearing Restrictions: No   Pertinent Vitals/Pain O2 sats 86% on RA with ambulation.  Replaced O2 at 2L after ambulation with sats 95%.  HR stable and no pain.     Mobility  Bed Mobility General bed mobility comments: pt EOB on arrival.  Assisted pt to finish bathing her LEs.  Pt appreciative.   Transfers Overall transfer level: Needs assistance Equipment used: Rolling walker (2 wheeled);None Transfers: Sit to/from Stand Sit to Stand: Min assist General transfer comment: cueing for hand placement, transfers and safety with assist for anterior translation and elevation from  surface Ambulation/Gait Ambulation/Gait assistance: Min assist Ambulation Distance (Feet): 300 Feet Assistive device: Rolling walker (2 wheeled);None Gait Pattern/deviations: Step-through pattern Gait velocity interpretation: Below normal speed for age/gender General Gait Details: Pt ambulated 300 feet holding onto IV pole but did not use it for balance that this PT could tell.  Her balance appears fairly good.      Exercises     PT Diagnosis:    PT Problem List:   PT Treatment Interventions:     PT Goals (current goals can now be found in the care plan section)    Visit Information  Last PT Received On: 03/25/13 Assistance Needed: +1 History of Present Illness: 68 year old female with prior history of coronary artery disease and ischemic cardiomyopathy status post coronary artery bypass grafting in 2012 who presented on transfer from [place] with chest pain, acute heart failure, and mild troponin elevation. She has been feeling sick for 1 week. CP, difficulty breathing. Denied fever. CHF and flu (+)    Subjective Data  Subjective: "I need help with my legs. "   Cognition  Cognition Arousal/Alertness: Awake/alert Behavior During Therapy: WFL for tasks assessed/performed Overall Cognitive Status: Within Functional Limits for tasks assessed    Balance  Balance Overall balance assessment: Needs assistance Sitting-balance support: No upper extremity supported;Feet supported Sitting balance-Leahy Scale: Good Sitting balance - Comments: Was sitting EOB bathing on arrival.  PT assisted wtih washing LEs.  Pt able to place her socks on I.    End of Session PT - End of Session Equipment Utilized During Treatment: Gait belt;Oxygen Activity Tolerance: Patient tolerated treatment well Patient left: in chair;with  call bell/phone within reach Nurse Communication: Mobility status   GP     INGOLD,Sanvika Cuttino 03/25/2013, 10:55 AM [facility] [phone] (pager)

## 2013-03-26 LAB — GLUCOSE, CAPILLARY
GLUCOSE-CAPILLARY: 286 mg/dL — AB (ref 70–99)
Glucose-Capillary: 167 mg/dL — ABNORMAL HIGH (ref 70–99)
Glucose-Capillary: 219 mg/dL — ABNORMAL HIGH (ref 70–99)

## 2013-03-26 LAB — BASIC METABOLIC PANEL
BUN: 19 mg/dL (ref 6–23)
CO2: 36 mEq/L — ABNORMAL HIGH (ref 19–32)
Calcium: 9.2 mg/dL (ref 8.4–10.5)
Chloride: 92 mEq/L — ABNORMAL LOW (ref 96–112)
Creatinine, Ser: 0.85 mg/dL (ref 0.50–1.10)
GFR calc Af Amer: 82 mL/min — ABNORMAL LOW (ref >90)
GFR calc non Af Amer: 70 mL/min — ABNORMAL LOW (ref >90)
Glucose, Bld: 208 mg/dL — ABNORMAL HIGH (ref 70–99)
Potassium: 3.5 mEq/L — ABNORMAL LOW (ref 3.7–5.3)
Sodium: 138 mEq/L (ref 137–147)

## 2013-03-26 MED ORDER — SODIUM CHLORIDE 0.9 % IJ SOLN: 3.0000 mL | INTRAMUSCULAR | Status: DC | PRN

## 2013-03-26 MED ORDER — SODIUM CHLORIDE 0.9 % IJ SOLN: 3.0000 mL | Freq: Two times a day (BID) | INTRAMUSCULAR | Status: DC

## 2013-03-26 MED ORDER — SODIUM CHLORIDE 0.9 % IV SOLN: INTRAVENOUS | Status: DC

## 2013-03-26 MED ORDER — SODIUM CHLORIDE 0.9 % IJ SOLN
3.0000 mL | Freq: Two times a day (BID) | INTRAMUSCULAR | Status: DC
  Administered 2013-03-27 (×2): 3 mL via INTRAVENOUS

## 2013-03-26 MED ORDER — SODIUM CHLORIDE 0.9 % IV SOLN: 250.0000 mL | INTRAVENOUS | Status: DC | PRN

## 2013-03-26 MED ORDER — FUROSEMIDE 10 MG/ML IJ SOLN
60.0000 mg | Freq: Two times a day (BID) | INTRAMUSCULAR | Status: AC
  Administered 2013-03-26: 60 mg via INTRAVENOUS

## 2013-03-26 MED ORDER — FUROSEMIDE 40 MG PO TABS
40.0000 mg | ORAL_TABLET | Freq: Two times a day (BID) | ORAL | Status: DC
  Administered 2013-03-27 – 2013-03-28 (×3): 40 mg via ORAL
  Filled 2013-03-26 (×5): qty 1

## 2013-03-26 MED ORDER — ASPIRIN 81 MG PO CHEW
81.0000 mg | CHEWABLE_TABLET | ORAL | Status: AC
  Administered 2013-03-27: 81 mg via ORAL
  Filled 2013-03-26: qty 1

## 2013-03-26 MED ORDER — SODIUM CHLORIDE 0.9 % IV SOLN: 1.0000 mL/kg/h | INTRAVENOUS | Status: DC

## 2013-03-26 MED ORDER — ALBUTEROL SULFATE (2.5 MG/3ML) 0.083% IN NEBU: 2.5000 mg | INHALATION_SOLUTION | RESPIRATORY_TRACT | Status: DC | PRN

## 2013-03-26 MED ORDER — ALBUTEROL SULFATE (2.5 MG/3ML) 0.083% IN NEBU
2.5000 mg | INHALATION_SOLUTION | Freq: Two times a day (BID) | RESPIRATORY_TRACT | Status: DC
  Administered 2013-03-26 – 2013-03-28 (×4): 2.5 mg via RESPIRATORY_TRACT
  Filled 2013-03-26 (×4): qty 3

## 2013-03-26 NOTE — Progress Notes (Signed)
Patient ID: [name] [name], female   DOB: 10/23/1945, 68 y.o.   MRN: [id number]  68 year old female with prior history of coronary artery disease and ischemic cardiomyopathy status post coronary artery bypass grafting in 2012 who presented on transfer from [place] with chest pain, acute heart failure, and mild troponin elevation. She has been feeling sick for 1 week. CP, difficulty breathing. Denied fever.  3/12 -- acutely decompensated with fever, chills, profound wheezing. She is + for Influenza B. With Nebs, feeling better.  On IV Heparin since admission - CE now negative & CP more MSK related. On IV Diuresis.  SUBJECTIVE:  Breathing better; walked in hallway today w/o O2 & had drop in SaO2 to ~85%.   Coughing more today.  No F/C,. Still notes chest soreness.  OBJECTIVE:   Vitals:   Filed Vitals:   03/26/13 5732 03/26/13 0815 03/26/13 0829 03/26/13 1154  BP: 167/58 188/64  139/45  Pulse:  51  54  Temp:  97.2 F (36.2 C)  98.2 F (36.8 C)  TempSrc:  Oral  Oral  Resp:  16  15  Height:      Weight:      SpO2:  97% 95% 96%   I&O's:    Intake/Output Summary (Last 24 hours) at 03/26/13 1343 Last data filed at 03/26/13 1300  Gross per 24 hour  Intake   1300 ml  Output   3800 ml  Net  -2500 ml   TELEMETRY: Reviewed telemetry pt in NSR:  PHYSICAL EXAM General: Well developed, well nourished, in no acute distress Head: Eyes PERRLA, No xanthomas.   Normal cephalic and atramatic  Lungs:   Bibasilar dullness to percussion with diminished BS; exp wheezing with mild rhonchi. R side notably worse than Left. Heart:   HRRR S1 S2 Pulses are 2+ & equal. Abdomen: Bowel sounds are positive, abdomen soft and non-tender without masses  Extremities:   No clubbing, cyanosis; 1-2+ LE edema.  DP +1 Neuro: Alert and oriented X 3.  Psych:  Good affect, responds appropriately  LABS: Basic Metabolic Panel:  Recent Labs  03/25/13 0354 03/26/13 0247  NA 139 138  K 3.4* 3.5*  CL  92* 92*  CO2 35* 36*  GLUCOSE 132* 208*  BUN 17 19  CREATININE 0.85 0.85  CALCIUM 9.1 9.2   Liver Function Tests: No results found for this basename: AST, ALT, ALKPHOS, BILITOT, PROT, ALBUMIN,  in the last 72 hours No results found for this basename: LIPASE, AMYLASE,  in the last 72 hours CBC:  Recent Labs  03/23/13 1532 03/24/13 0310  WBC 4.2 5.0  HGB 11.5* 11.6*  HCT 35.6* 36.6  MCV 84.6 84.1  PLT 191 217   BNP: No components found with this basename: POCBNP,   Anemia Panel: No results found for this basename: VITAMINB12, FOLATE, FERRITIN, TIBC, IRON, RETICCTPCT,  in the last 72 hours  RADIOLOGY: No results found.  Assessment/Plan:  Principal Problem:   Acute on chronic combined systolic and diastolic congestive heart failure, NYHA class 4 - EF 30%; h/o CABG Active Problems:   Diabetes mellitus type II, uncontrolled   Influenza B   Coronary atherosclerosis of native coronary artery   Chest pain with moderate risk for cardiac etiology - mild troponin bump initially (likely related to CHF);   UTI (urinary tract infection) - on day 6 of Cipro    Carotid artery disease  PLAN:  1. Respiratory distress:  worsening dyspnea was due to the Influenza complicating  A on C combined HF..  2. CAD: Troponin levels are negative EKG 03/20/13 with ant changes. Stable, no angina.  Plan R&LHC - but will do tomorrow since she had mild desat today. 3. Chronic systolic CHF: Continue current supportive care. EF 30% down from 11/2010.  Pro BNP 2927   Probably close to resolved Flu Sx --> Would need Cardiac Cath once more stable.  Renal Fxn is stable  At this point, I think she is relatively stable & should be OK for R&LHC tomorrow to investigate Drop in EF & to determine need for additional diuresis. (had planned today -- will postpone til tomorrow AM); NPO after MN.  Continues to diurese on 51m IV Lasix  BID; foley in place (hold PM lasix dose today & AM dose tomorrow pre-cath). -- >  will adjust diuresis based upon RHC numbers.  BP improved with increased Captopril to 12.5 mg tid for additional afterload reduction yesterday -- will increase Coreg today. 4. Diabetes uncontrolled Hgb A1c 10.8, glucose 277 to 189 - plan had been adding low dose Levimer while inpt, but not ordered.  On SSI.  On 10 mg Levimer QHS & add  4 of Novolog QAC. 5.  Completed 5 day course of Cipro for UTI. D/c today (no further dysuria) 6.  Influenza - (had flu shot shortly before admission); ? Outside of window for Tamiflu.  On nebs -- with persistent abnormal lung exam, will recheck CXR in AM  PT to eval & Rx - try to get her up & walking.  Indication for CATH -- New onset Cardiomyopathy, known CAD-CABG, Mild Troponin elevation - thought to be Type 2 MI (but with concomitant CP, cannot exclude new ischemic CAD).   HLeonie Man MD  03/26/2013  1:43 PM

## 2013-03-27 ENCOUNTER — Encounter (HOSPITAL_COMMUNITY)
Admission: AD | Disposition: A | Discharge status: Home Health | Payer: Medicare Other | Source: Other Acute Inpatient Hospital | Attending: Internal Medicine

## 2013-03-27 DIAGNOSIS — I25810 Atherosclerosis of coronary artery bypass graft(s) without angina pectoris: Secondary | ICD-10-CM

## 2013-03-27 DIAGNOSIS — R7989 Other specified abnormal findings of blood chemistry: Secondary | ICD-10-CM

## 2013-03-27 DIAGNOSIS — R778 Other specified abnormalities of plasma proteins: Secondary | ICD-10-CM | POA: Diagnosis present

## 2013-03-27 HISTORY — PX: LEFT AND RIGHT HEART CATHETERIZATION WITH CORONARY/GRAFT ANGIOGRAM: SHX5448

## 2013-03-27 LAB — POCT I-STAT 3, VENOUS BLOOD GAS (G3P V)
Acid-Base Excess: 8 mmol/L — ABNORMAL HIGH (ref 0.0–2.0)
Bicarbonate: 34.9 mEq/L — ABNORMAL HIGH (ref 20.0–24.0)
O2 SAT: 65 %
TCO2: 37 mmol/L (ref 0–100)
pCO2, Ven: 60.1 mmHg — ABNORMAL HIGH (ref 45.0–50.0)
pH, Ven: 7.372 — ABNORMAL HIGH (ref 7.250–7.300)
pO2, Ven: 36 mmHg (ref 30.0–45.0)

## 2013-03-27 LAB — BASIC METABOLIC PANEL
BUN: 20 mg/dL (ref 6–23)
CHLORIDE: 92 meq/L — AB (ref 96–112)
CO2: 34 mEq/L — ABNORMAL HIGH (ref 19–32)
CREATININE: 0.9 mg/dL (ref 0.50–1.10)
Calcium: 9.3 mg/dL (ref 8.4–10.5)
GFR calc Af Amer: 76 mL/min — ABNORMAL LOW (ref >90)
GFR calc non Af Amer: 66 mL/min — ABNORMAL LOW (ref >90)
Glucose, Bld: 142 mg/dL — ABNORMAL HIGH (ref 70–99)
Potassium: 3.5 mEq/L — ABNORMAL LOW (ref 3.7–5.3)
Sodium: 136 mEq/L — ABNORMAL LOW (ref 137–147)

## 2013-03-27 LAB — POCT I-STAT 3, ART BLOOD GAS (G3+)
Acid-Base Excess: 9 mmol/L — ABNORMAL HIGH (ref 0.0–2.0)
Bicarbonate: 33.9 mEq/L — ABNORMAL HIGH (ref 20.0–24.0)
O2 Saturation: 99 %
PCO2 ART: 48.7 mmHg — AB (ref 35.0–45.0)
PO2 ART: 123 mmHg — AB (ref 80.0–100.0)
TCO2: 35 mmol/L (ref 0–100)
pH, Arterial: 7.45 (ref 7.350–7.450)

## 2013-03-27 LAB — GLUCOSE, CAPILLARY
GLUCOSE-CAPILLARY: 136 mg/dL — AB (ref 70–99)
GLUCOSE-CAPILLARY: 170 mg/dL — AB (ref 70–99)
Glucose-Capillary: 158 mg/dL — ABNORMAL HIGH (ref 70–99)
Glucose-Capillary: 148 mg/dL — ABNORMAL HIGH (ref 70–99)
Glucose-Capillary: 120 mg/dL — ABNORMAL HIGH (ref 70–99)

## 2013-03-27 LAB — PROTIME-INR
INR: 1.02 (ref 0.00–1.49)
Prothrombin Time: 13.2 seconds (ref 11.6–15.2)

## 2013-03-27 LAB — POCT ACTIVATED CLOTTING TIME: ACTIVATED CLOTTING TIME: 249 s

## 2013-03-27 SURGERY — LEFT AND RIGHT HEART CATHETERIZATION WITH CORONARY/GRAFT ANGIOGRAM
Anesthesia: LOCAL

## 2013-03-27 MED ORDER — CLOPIDOGREL BISULFATE 300 MG PO TABS
ORAL_TABLET | ORAL | Status: AC
Start: 2013-03-27 — End: 2013-03-27
  Filled 2013-03-27: qty 1

## 2013-03-27 MED ORDER — CLOPIDOGREL BISULFATE 75 MG PO TABS
75.0000 mg | ORAL_TABLET | Freq: Every day | ORAL | Status: DC
  Administered 2013-03-28 – 2013-03-29 (×2): 75 mg via ORAL
  Filled 2013-03-27 (×2): qty 1

## 2013-03-27 MED ORDER — LIDOCAINE HCL (PF) 1 % IJ SOLN
INTRAMUSCULAR | Status: AC
  Filled 2013-03-27: qty 30

## 2013-03-27 MED ORDER — SODIUM CHLORIDE 0.9 % IV SOLN
INTRAVENOUS | Status: AC
Start: 2013-03-27 — End: 2013-03-27
  Administered 2013-03-27: 16:00:00 via INTRAVENOUS

## 2013-03-27 MED ORDER — HEPARIN (PORCINE) IN NACL 2-0.9 UNIT/ML-% IJ SOLN
INTRAMUSCULAR | Status: AC
  Filled 2013-03-27: qty 500

## 2013-03-27 MED ORDER — HYDRALAZINE HCL 20 MG/ML IJ SOLN
10.0000 mg | Freq: Four times a day (QID) | INTRAMUSCULAR | Status: DC | PRN
  Administered 2013-03-27: 10 mg via INTRAVENOUS
  Filled 2013-03-27: qty 1

## 2013-03-27 MED ORDER — BIVALIRUDIN 250 MG IV SOLR
INTRAVENOUS | Status: AC
  Filled 2013-03-27: qty 250

## 2013-03-27 MED ORDER — CAPTOPRIL 25 MG PO TABS
25.0000 mg | ORAL_TABLET | Freq: Three times a day (TID) | ORAL | Status: DC
  Administered 2013-03-28: 25 mg via ORAL
  Filled 2013-03-27 (×4): qty 1

## 2013-03-27 MED ORDER — HEPARIN (PORCINE) IN NACL 2-0.9 UNIT/ML-% IJ SOLN
INTRAMUSCULAR | Status: AC
  Filled 2013-03-27: qty 1000

## 2013-03-27 MED ORDER — NITROGLYCERIN 0.2 MG/ML ON CALL CATH LAB
INTRAVENOUS | Status: AC
  Filled 2013-03-27: qty 1

## 2013-03-27 MED ORDER — ASPIRIN 81 MG PO CHEW
CHEWABLE_TABLET | ORAL | Status: AC
  Filled 2013-03-27: qty 1

## 2013-03-27 MED ORDER — MORPHINE SULFATE 2 MG/ML IJ SOLN
INTRAMUSCULAR | Status: AC
  Filled 2013-03-27: qty 1

## 2013-03-27 MED ORDER — SODIUM CHLORIDE 0.9 % IV SOLN
0.2500 mg/kg/h | INTRAVENOUS | Status: AC
  Filled 2013-03-27: qty 250

## 2013-03-27 MED ORDER — FENTANYL CITRATE 0.05 MG/ML IJ SOLN
INTRAMUSCULAR | Status: AC
  Filled 2013-03-27: qty 2

## 2013-03-27 MED ORDER — MIDAZOLAM HCL 2 MG/2ML IJ SOLN
INTRAMUSCULAR | Status: AC
  Filled 2013-03-27: qty 2

## 2013-03-27 MED ORDER — HYDRALAZINE HCL 20 MG/ML IJ SOLN
INTRAMUSCULAR | Status: AC
  Filled 2013-03-27: qty 1

## 2013-03-27 MED ORDER — CLOPIDOGREL BISULFATE 300 MG PO TABS
ORAL_TABLET | ORAL | Status: AC
  Filled 2013-03-27: qty 1

## 2013-03-27 MED ORDER — BUPIVACAINE HCL (PF) 0.25 % IJ SOLN
INTRAMUSCULAR | Status: AC
  Filled 2013-03-27: qty 30

## 2013-03-27 NOTE — Interval H&P Note (Signed)
History and Physical Interval Note:  03/27/2013 10:50 AM  [name] [name]  has presented today for cardiac cath with the diagnosis of cp/NSTEMI.  The various methods of treatment have been discussed with the patient and family. After consideration of risks, benefits and other options for treatment, the patient has consented to  Procedure(s): LEFT AND RIGHT HEART CATHETERIZATION WITH CORONARY/GRAFT ANGIOGRAM (N/A) as a surgical intervention .  The patient's history has been reviewed, patient examined, no change in status, stable for surgery.  I have reviewed the patient's chart and labs.  Questions were answered to the patient's satisfaction.    Cath Lab Visit (complete for each Cath Lab visit)  Clinical Evaluation Leading to the Procedure:   ACS: yes  Non-ACS:    Anginal Classification: CCS IV  Anti-ischemic medical therapy: No Therapy  Non-Invasive Test Results: No non-invasive testing performed  Prior CABG: Previous CABG       Shyler Hamill

## 2013-03-27 NOTE — Progress Notes (Signed)
Patient ID: ALISEN MARSIGLIA, female   DOB: 1945-07-25, 68 y.o.   MRN: [id number]  68 year old female with prior history of coronary artery disease and ischemic cardiomyopathy status post coronary artery bypass grafting in 2012 who presented on transfer from [place] with chest pain, acute heart failure, and mild troponin elevation. She has been feeling sick for 1 week. CP, difficulty breathing. Denied fever.  3/12 -- acutely decompensated with fever, chills, profound wheezing. She is + for Influenza B. With Nebs, feeling better.  On IV Heparin since admission - CE now negative & CP more MSK related. On IV Diuresis.  SUBJECTIVE:  Breathing OK today; slept OK.  Has not walked yet.  OBJECTIVE:   Vitals:   Filed Vitals:   03/27/13 0600 03/27/13 0809 03/27/13 0820 03/27/13 0851  BP:  157/77  157/77  Pulse:  58  56  Temp:  97.6 F (36.4 C)    TempSrc:  Oral    Resp:  19    Height:      Weight: 190 lb 14.7 oz (86.6 kg)     SpO2:  98% 98%    I&O's:    Intake/Output Summary (Last 24 hours) at 03/27/13 0935 Last data filed at 03/27/13 0813  Gross per 24 hour  Intake    440 ml  Output   2950 ml  Net  -2510 ml   TELEMETRY: Reviewed telemetry pt in NSR:  PHYSICAL EXAM General: Well developed, well nourished, in no acute distress Head: Eyes PERRLA, No xanthomas.   Normal cephalic and atramatic  Lungs:   Bibasilar dullness to percussion with diminished BS; exp wheezing& mild rhonchi. R side notably worse than Left. Heart:   RRR, S1 S2 Pulses are 2+ & equal. Abdomen: soft/NT/ND/NABS Extremities:   No clubbing, cyanosis; 1-2+ LE edema.  DP +1 Neuro: Alert and oriented X 3.  Psych:  Good affect, responds appropriately  LABS: Basic Metabolic Panel:  Recent Labs  03/26/13 0247 03/27/13 0305  NA 138 136*  K 3.5* 3.5*  CL 92* 92*  CO2 36* 34*  GLUCOSE 208* 142*  BUN 19 20  CREATININE 0.85 0.90  CALCIUM 9.2 9.3   Liver Function Tests: No results found for this  basename: AST, ALT, ALKPHOS, BILITOT, PROT, ALBUMIN,  in the last 72 hours No results found for this basename: LIPASE, AMYLASE,  in the last 72 hours CBC: No results found for this basename: WBC, NEUTROABS, HGB, HCT, MCV, PLT,  in the last 72 hours BNP: No components found with this basename: POCBNP,   Anemia Panel: No results found for this basename: VITAMINB12, FOLATE, FERRITIN, TIBC, IRON, RETICCTPCT,  in the last 72 hours  RADIOLOGY: CXR 3/17: improved aeration / CHF;  Assessment/Plan:  Principal Problem:   Acute on chronic combined systolic and diastolic congestive heart failure, NYHA class 4 - EF 30%; h/o CABG Active Problems:   Diabetes mellitus type II, uncontrolled   Chest pain with moderate risk for cardiac etiology - mild troponin bump initially (likely related to CHF);   Influenza B   Coronary atherosclerosis of native coronary artery   UTI (urinary tract infection) - on day 6 of Cipro    Troponin level elevated   Carotid artery disease  PLAN:  1. Respiratory distress:  - improving, but lungs still still concerning.   Suspect some component of COPD as CHF is improving  Nebs.  2. CAD:  Stable, no angina, just mild aching.  Plan R&LHC -today based upon CP & +  troponin on admission & newly reduced LVEF in pt with CAD-CABG. 3. Chronic systolic CHF: Continue current supportive care. EF 30% down from 11/2010.  Pro BNP 2927   Able to lie flat for R&LHC today -- will use #s to determine extent of diuresis still required & to adjust meds.  AM Lasix on hold for cath.  BP remains elevated -- can increase ACE-I (once renal Fxn clear post cath); may need Hydral/nitrates as well.  Adjust diuresis based on RHC #s. 4. Diabetes uncontrolled Hgb A1c 10.8, glucose more stable  - continue Levimer while inpt, but not ordered.  On SSI.  On 10 mg Levimer QHS & add  4 of Novolog QAC. 5.  Completed 5 day course of Cipro for UTI. D/c today (no further dysuria) 6.  Influenza - (had flu  shot shortly before admission); ? Outside of window for Tamiflu.  On nebs -- with persistent abnormal lung exam, will recheck CXR in AM  Continue ambulation & assessment of SaO2,  Indication for CATH -- New onset Cardiomyopathy, known CAD-CABG, Mild Troponin elevation - thought to be Type 2 MI (but with concomitant CP, cannot exclude new ischemic CAD).   Leonie Man, MD  03/27/2013  9:35 AM

## 2013-03-27 NOTE — Progress Notes (Signed)
Site area: right groin  Site Prior to Removal:  Level 0  Pressure Applied For 20 MINUTES    Minutes Beginning at 19:45  Manual:   yes  Patient Status During Pull:  Pt vomited 5 mins before manual pressure applied @ R groin is done  Post Pull Groin Site:  Level 0  Post Pull Instructions Given:  yes  Post Pull Pulses Present:  yes  Dressing Applied:  yes  Comments:

## 2013-03-27 NOTE — H&P (View-Only) (Signed)
  Patient ID: [name] [name], female   DOB: 08/27/1945, 67 y.o.   MRN: [id number]  67-year-old female with prior history of coronary artery disease and ischemic cardiomyopathy status post coronary artery bypass grafting in 2012 who presented on transfer from [facility] with chest pain, acute heart failure, and mild troponin elevation. She has been feeling sick for 1 week. CP, difficulty breathing. Denied fever.  3/12 -- acutely decompensated with fever, chills, profound wheezing. She is + for Influenza B. With Nebs, feeling better.  On IV Heparin since admission - CE now negative & CP more MSK related. On IV Diuresis.  SUBJECTIVE:  Breathing OK today; slept OK.  Has not walked yet.  OBJECTIVE:   Vitals:   Filed Vitals:   03/27/13 0600 03/27/13 0809 03/27/13 0820 03/27/13 0851  BP:  157/77  157/77  Pulse:  58  56  Temp:  97.6 F (36.4 C)    TempSrc:  Oral    Resp:  19    Height:      Weight: 190 lb 14.7 oz (86.6 kg)     SpO2:  98% 98%    I&O's:    Intake/Output Summary (Last 24 hours) at 03/27/13 0935 Last data filed at 03/27/13 0813  Gross per 24 hour  Intake    440 ml  Output   2950 ml  Net  -2510 ml   TELEMETRY: Reviewed telemetry pt in NSR:  PHYSICAL EXAM General: Well developed, well nourished, in no acute distress Head: Eyes PERRLA, No xanthomas.   Normal cephalic and atramatic  Lungs:   Bibasilar dullness to percussion with diminished BS; exp wheezing& mild rhonchi. R side notably worse than Left. Heart:   RRR, S1 S2 Pulses are 2+ & equal. Abdomen: soft/NT/ND/NABS Extremities:   No clubbing, cyanosis; 1-2+ LE edema.  DP +1 Neuro: Alert and oriented X 3.  Psych:  Good affect, responds appropriately  LABS: Basic Metabolic Panel:  Recent Labs  03/26/13 0247 03/27/13 0305  NA 138 136*  K 3.5* 3.5*  CL 92* 92*  CO2 36* 34*  GLUCOSE 208* 142*  BUN 19 20  CREATININE 0.85 0.90  CALCIUM 9.2 9.3   Liver Function Tests: No results found for this  basename: AST, ALT, ALKPHOS, BILITOT, PROT, ALBUMIN,  in the last 72 hours No results found for this basename: LIPASE, AMYLASE,  in the last 72 hours CBC: No results found for this basename: WBC, NEUTROABS, HGB, HCT, MCV, PLT,  in the last 72 hours BNP: No components found with this basename: POCBNP,   Anemia Panel: No results found for this basename: VITAMINB12, FOLATE, FERRITIN, TIBC, IRON, RETICCTPCT,  in the last 72 hours  RADIOLOGY: CXR 3/17: improved aeration / CHF;  Assessment/Plan:  Principal Problem:   Acute on chronic combined systolic and diastolic congestive heart failure, NYHA class 4 - EF 30%; h/o CABG Active Problems:   Diabetes mellitus type II, uncontrolled   Chest pain with moderate risk for cardiac etiology - mild troponin bump initially (likely related to CHF);   Influenza B   Coronary atherosclerosis of native coronary artery   UTI (urinary tract infection) - on day 6 of Cipro    Troponin level elevated   Carotid artery disease  PLAN:  1. Respiratory distress:  - improving, but lungs still still concerning.   Suspect some component of COPD as CHF is improving  Nebs.  2. CAD:  Stable, no angina, just mild aching.  Plan R&LHC -today based upon CP & +   troponin on admission & newly reduced LVEF in pt with CAD-CABG. 3. Chronic systolic CHF: Continue current supportive care. EF 30% down from 11/2010.  Pro BNP 2927   Able to lie flat for R&LHC today -- will use #s to determine extent of diuresis still required & to adjust meds.  AM Lasix on hold for cath.  BP remains elevated -- can increase ACE-I (once renal Fxn clear post cath); may need Hydral/nitrates as well.  Adjust diuresis based on RHC #s. 4. Diabetes uncontrolled Hgb A1c 10.8, glucose more stable  - continue Levimer while inpt, but not ordered.  On SSI.  On 10 mg Levimer QHS & add  4 of Novolog QAC. 5.  Completed 5 day course of Cipro for UTI. D/c today (no further dysuria) 6.  Influenza - (had flu  shot shortly before admission); ? Outside of window for Tamiflu.  On nebs -- with persistent abnormal lung exam, will recheck CXR in AM  Continue ambulation & assessment of SaO2,  Indication for CATH -- New onset Cardiomyopathy, known CAD-CABG, Mild Troponin elevation - thought to be Type 2 MI (but with concomitant CP, cannot exclude new ischemic CAD).   Jonita Hirota W, MD  03/27/2013  9:35 AM  

## 2013-03-27 NOTE — CV Procedure (Signed)
Cardiac Catheterization Operative Report  [name] [name] [id number] 3/19/201512:05 PM VYAS,DHRUV B., MD  Procedure Performed:  1. Left Heart Catheterization 2. Selective Coronary Angiography 3. Right Heart Catheterization 4. PTCA/DES x 1 distal body of SVG to Diagonal   Operator: Lauree Chandler, MD  Indication: 68 yo female with history of DM, CAD s/p CABG 2012, HTN, HLD, CVA, PAD who presented on transfer from [place] with chest pain, acute heart failure, and mild troponin elevation. She has been feeling sick for 1 week. CP, difficulty breathing. Denied fever. Positive for flu.                                 Procedure Details: The risks, benefits, complications, treatment options, and expected outcomes were discussed with the patient. The patient and/or family concurred with the proposed plan, giving informed consent. The patient was brought to the cath lab after IV hydration was begun and oral premedication was given. The patient was further sedated with Versed and Fentanyl. The right groin was prepped and draped in the usual manner. Using the modified Seldinger access technique, a 5 French sheath was placed in the femoral artery. A 6 French sheath was inserted into the right femoral vein. A balloon tipped catheter was used to perform a right heart catheterization. Standard diagnostic catheters were used to perform selective coronary angiography. The JR4 was used to engage the RCA, SVG to RCA. An IMA catheter was used to engage the LIMA graft. The SVG to the Diagonal and SVG to OM were engaged with an AL-1. A pigtail catheter was used to measure left ventricular pressures. She was found to have severe stenosis in the distal body of the SVG to the Diagonal. I elected to proceed to PCI of the SVG.   PCI Note: She was given Plavix 600 mg po x 1. She was given a bolus of Angiomax and a drip was started. I then engaged the SVG to the Diagonal with an AL-1 guide. When the ACT  was over 200, I passed a Cougar IC wire down the SVG to the Diagonal and out into the native vessel. A 3.0 x 12 mm Promus Premier DES was deployed in the distal body of the vein graft to the Diagonal. There was an excellent result with TIMI-3 flow into the target vessel. The stenosis was taken from 90% down to 0%.   There were no immediate complications. The patient was taken to the recovery area in stable condition.   Hemodynamic Findings: Ao: 173/71         LV: 172/9/20 RA:  7            RV: 41/2/6 PA: 46/18 (mean 24)   PCWP:  9 Fick Cardiac Output: 4.88 L/min Fick Cardiac Index: 2.48 L/min/m2 Central Aortic Saturation: 99% Pulmonary Artery Saturation: 65%  Angiographic Findings:  Left main: No left main. The LAD and Circumflex arise from different ostia.  Left Anterior Descending Artery: Large caliber vessel with diffuse 40% proximal and mid stenosis followed by 99% sub-total occlusion mid vessel. The mid and distal vessel fills from the patent IMA graft. The moderate caliber diagonal branch has proximal 90% stenosis.   Circumflex Artery: Moderate caliber diffusely diseased vessel with serial 99% stenoses throughout the proximal and mid segment. The obtuse marginal branch has an ostial 99% stenosis and fills retrograde from a patent vein graft.   Right Coronary Artery: Moderate  caliber diffusely diseased vessel with 80% stenosis throughout the proximal and mid segments. The distal vessel fills from antegrade flow and from the patent vein graft.   Graft Anatomy:  SVG to Diagonal is patent with 90% stenosis distal body of graft SVG to OM is patent SVG to PDA is patent LIMA to mid LAD is patent  Left Ventricular Angiogram: Deferred.   Impression: 1. Severe triple vessel CAD s/p 4V CABG with 4/4 patent grafts 2. High grade stenosis distal body of SVG to Diagonal 3. Successful PTCA/DES x 1 distal body of SVG to Diagonal  Recommendations: She will need ASA and Plavix for one year.  She seems to be relatively euvolemic. Further plans per rounding team.        Complications:  None; patient tolerated the procedure well.

## 2013-03-27 NOTE — Progress Notes (Signed)
PT Cancellation Note  Patient Details Name: [name] [name] MRN: [id number] DOB: November 22, 1945   Cancelled Treatment:    Reason Eval/Treat Not Completed: Patient at procedure or test/unavailable.  Pt in cardiac cath.  Will check back tomorrow as pt will be on bedrest post procedure.  Thanks.    INGOLD,Ziare Cryder 03/27/2013, 11:06 AM [facility] [phone] (pager)

## 2013-03-28 ENCOUNTER — Inpatient Hospital Stay (HOSPITAL_COMMUNITY): Payer: Medicare Other

## 2013-03-28 DIAGNOSIS — I214 Non-ST elevation (NSTEMI) myocardial infarction: Secondary | ICD-10-CM

## 2013-03-28 DIAGNOSIS — R0902 Hypoxemia: Secondary | ICD-10-CM | POA: Diagnosis present

## 2013-03-28 LAB — GLUCOSE, CAPILLARY
GLUCOSE-CAPILLARY: 152 mg/dL — AB (ref 70–99)
GLUCOSE-CAPILLARY: 190 mg/dL — AB (ref 70–99)
Glucose-Capillary: 223 mg/dL — ABNORMAL HIGH (ref 70–99)
Glucose-Capillary: 207 mg/dL — ABNORMAL HIGH (ref 70–99)

## 2013-03-28 LAB — CBC
HEMATOCRIT: 38.3 % (ref 36.0–46.0)
Hemoglobin: 12.2 g/dL (ref 12.0–15.0)
MCH: 26.9 pg (ref 26.0–34.0)
MCHC: 31.9 g/dL (ref 30.0–36.0)
MCV: 84.4 fL (ref 78.0–100.0)
Platelets: 321 10*3/uL (ref 150–400)
RBC: 4.54 MIL/uL (ref 3.87–5.11)
RDW: 14.8 % (ref 11.5–15.5)
WBC: 7.5 10*3/uL (ref 4.0–10.5)

## 2013-03-28 LAB — BASIC METABOLIC PANEL
BUN: 20 mg/dL (ref 6–23)
CO2: 31 mEq/L (ref 19–32)
Calcium: 9.3 mg/dL (ref 8.4–10.5)
Chloride: 95 mEq/L — ABNORMAL LOW (ref 96–112)
Creatinine, Ser: 0.93 mg/dL (ref 0.50–1.10)
GFR calc Af Amer: 73 mL/min — ABNORMAL LOW (ref >90)
GFR, EST NON AFRICAN AMERICAN: 63 mL/min — AB (ref >90)
GLUCOSE: 210 mg/dL — AB (ref 70–99)
Potassium: 4 mEq/L (ref 3.7–5.3)
Sodium: 137 mEq/L (ref 137–147)

## 2013-03-28 MED ORDER — IPRATROPIUM-ALBUTEROL 0.5-2.5 (3) MG/3ML IN SOLN
3.0000 mL | Freq: Four times a day (QID) | RESPIRATORY_TRACT | Status: DC
  Administered 2013-03-28 – 2013-03-29 (×3): 3 mL via RESPIRATORY_TRACT
  Filled 2013-03-28 (×3): qty 3

## 2013-03-28 MED ORDER — ATORVASTATIN CALCIUM 40 MG PO TABS
40.0000 mg | ORAL_TABLET | Freq: Every day | ORAL | Status: DC
Start: 2013-03-28 — End: 2013-03-29
  Administered 2013-03-28: 40 mg via ORAL
  Filled 2013-03-28 (×2): qty 1

## 2013-03-28 MED ORDER — FUROSEMIDE 40 MG PO TABS
40.0000 mg | ORAL_TABLET | Freq: Every day | ORAL | Status: DC
  Administered 2013-03-29: 09:00:00 40 mg via ORAL
  Filled 2013-03-28: qty 1

## 2013-03-28 MED ORDER — LISINOPRIL 20 MG PO TABS
20.0000 mg | ORAL_TABLET | Freq: Two times a day (BID) | ORAL | Status: DC
  Administered 2013-03-28 – 2013-03-29 (×2): 20 mg via ORAL
  Filled 2013-03-28 (×5): qty 1

## 2013-03-28 MED ORDER — LIVING WELL WITH DIABETES BOOK
Freq: Once | Status: AC
  Administered 2013-03-28: 22:00:00
  Filled 2013-03-28: qty 1

## 2013-03-28 MED ORDER — MICONAZOLE NITRATE 2 % EX CREA
TOPICAL_CREAM | Freq: Two times a day (BID) | CUTANEOUS | Status: DC
  Administered 2013-03-28 – 2013-03-29 (×3): via TOPICAL
  Filled 2013-03-28: qty 14

## 2013-03-28 MED ORDER — LISINOPRIL 10 MG PO TABS: 10.0000 mg | ORAL_TABLET | Freq: Two times a day (BID) | ORAL | Status: DC

## 2013-03-28 MED FILL — Sodium Chloride IV Soln 0.9%: INTRAVENOUS | Qty: 50 | Status: AC

## 2013-03-28 NOTE — Progress Notes (Signed)
Order for outpatient DM education entered for the [facility], [place], [place].   Harvel Ricks RN BSN CDE

## 2013-03-28 NOTE — Progress Notes (Addendum)
`   Patient ID: [name] [name], female   DOB: 14-Jun-1945, 68 y.o.   MRN: [id number]  68 year old female with prior history of coronary artery disease and ischemic cardiomyopathy status post coronary artery bypass grafting in 2012 who presented on transfer from [place] with chest pain, acute heart failure, and mild troponin elevation. She has been feeling sick for 1 week. CP, difficulty breathing. Denied fever.  3/12 -- acutely decompensated with fever, chills, profound wheezing. She is + for Influenza B. With Nebs, feeling better.  On IV Heparin since admission - CE now negative & CP more MSK related. S/P LHC yesterday resulting in PCI w/ DED x 1 to distal body of SVG-Diagonal  SUBJECTIVE:  Breathing has improved significantly. Denies chest pain. She just finished PT session. She had no difficulties ambulating. Denies anterior groin, flank and low back pain. Pt is ready to go home.   OBJECTIVE:   Vitals:   Filed Vitals:   03/28/13 0900 03/28/13 0930 03/28/13 1242 03/28/13 1245  BP: 159/49 141/43 150/49   Pulse: 62 87 62   Temp: 97.9 F (36.6 C)  97.8 F (36.6 C)   TempSrc: Oral  Oral   Resp: 18  18   Height:    _0  (1.676 m)  Weight:    425 lb 7.8 oz (193 kg)  SpO2: 94% 94% 94%    I&O's:    Intake/Output Summary (Last 24 hours) at 03/28/13 1357 Last data filed at 03/28/13 1300  Gross per 24 hour  Intake    660 ml  Output   2100 ml  Net  -1440 ml   TELEMETRY: Reviewed telemetry pt in NSR:  PHYSICAL EXAM General: Well developed, well nourished, in no acute distress Head: Eyes PERRLA, No xanthomas.   Normal cephalic and atramatic  Lungs:   Bibasilar dullness to percussion with diminished BS; exp wheezing& mild rhonchi. R side notably worse than Left. Heart:   RRR, S1 S2 Pulses are 2+ & equal. Abdomen: soft/NT/ND/NABS Extremities:   No clubbing, cyanosis; trace bilateral edema.  DP +1. Right groin free from ecchymosis, hematoma and bruit. Neuro: Alert and oriented  X 3.  Psych:  Good affect, responds appropriately  LABS: Basic Metabolic Panel:  Recent Labs  03/27/13 0305 03/28/13 0505  NA 136* 137  K 3.5* 4.0  CL 92* 95*  CO2 34* 31  GLUCOSE 142* 210*  BUN 20 20  CREATININE 0.90 0.93  CALCIUM 9.3 9.3   Liver Function Tests: No results found for this basename: AST, ALT, ALKPHOS, BILITOT, PROT, ALBUMIN,  in the last 72 hours No results found for this basename: LIPASE, AMYLASE,  in the last 72 hours CBC:  Recent Labs  03/28/13 0505  WBC 7.5  HGB 12.2  HCT 38.3  MCV 84.4  PLT 321   BNP: No components found with this basename: POCBNP,   Anemia Panel: No results found for this basename: VITAMINB12, FOLATE, FERRITIN, TIBC, IRON, RETICCTPCT,  in the last 72 hours  RADIOLOGY: CXR 3/17: improved aeration / CHF;  Assessment/Plan:  Principal Problem:   Acute on chronic combined systolic and diastolic congestive heart failure, NYHA class 4 - EF 30%; h/o CABG Active Problems:   Diabetes mellitus type II, uncontrolled   Chest pain with moderate risk for cardiac etiology - mild troponin bump initially (likely related to CHF);   Influenza B   Non-STEMI (non-ST elevated myocardial infarction) - 80% lesion in SVG-diagonal treated with DES stent   Coronary atherosclerosis of  native coronary artery   UTI (urinary tract infection) - on day 6 of Cipro    Troponin level elevated   Hypoxia - with exertion   Carotid artery disease  PLAN:  1. Respiratory distress:  - Improved, but still with slight rhonchus breath sounds + mild wheezing. No rales. Suspect some component of COPD as CHF is improving. Continue nebs.  2. CAD:  S/p LHC resulting in PCI + DES to distal body of SVG-Diagonal. LIMA-LAD, SVG-OM and SVG-PDA were all patent. She denies further chest pain. No difficulty/limitations ambulating w/ PT this am. Continue On DAPT with ASA + Plavix, as well as BB and ACE-I. She does not appear to be on a statin. ? Adding Lipitor.   3. Chronic  systolic CHF/Cardiomyopathy: EF 30% down from 11/2010.    S/P RHC Hemodynamic Findings:   Ao: 173/71   LV: 172/9/20   RA: 7   RV: 41/2/6   PA: 46/18 (mean 24)   PCWP: 9   Fick Cardiac Output: 4.88 L/min   Fick Cardiac Index: 2.48 L/min/m2   Central Aortic Saturation: 99%   Pulmonary Artery Saturation: 65%  She appears euvolemic on physical exam. Continue PO Diuretic, ACE-I and BB. She is on 40 mg of Lasix BID, 25 mg of captopril TID and 6.25 mg of Coreg BID. BP remains moderately elevated with SBP in the 150s. HR in the 70s. Renal function is stable. We can consider further titration of her Coreg to 12.5 mg BID and perhaps her captopril to 50 mg TID. If she is to go home on 40 mg of PO Lasix BID, then she may require daily potassium supplementation to prevent hypokalemia.   4. Diabetes uncontrolled Hgb A1c 10.8, glucose more stable  - continue Levimer while inpt. On SSI.  On 10 mg Levimer QHS & add  4 of Novolog QAC.  5. UTI -  Completed 5 day course of Cipro for UTI. No further dysuria.  6.  Influenza -feeling better, however still with mild rhonchi + wheezing on exam. She is afebrile and WBC is WNL. Continue nebs.    Lyda Jester, PA-C  03/28/2013  9:19 AM   I seen and evaluated the patient along with bradycardia Rosita Fire, PA-C. I agree with her findings, exam, impression and recommendations.  She looks significantly better today, and her lungs are much clearer today. He denies any more chest discomfort and is angulated in hallway. The only precluding factor is that she continues to drop her oxygen levels into the mid 80s without oxygen while walking.  For her hypertension, I would like to switch her from a 3 times a day medication to be a twice a day medications will switch her captopril to lisinopril. Based on notations from the pharmacist, I will use 20 mg twice a day.  Will reduce Lasix to once daily oral. The second dose will be on a sliding scale when necessary basis  upon discharge.  Her glycemic control been relatively stable, but will need to be closely followed up by her primary physician. I would continue a standing dose of  long-lasting insulin. We will have the diabetes consult service, for diabetes education and training. He'll need to learn how to do the insulin shots.  UTI implants are stable. She is still on Doppler precautions. We'll need to clarify with infection prevention to determine how long this requirement is carried out.   This point, it is patient being discharged tomorrow. We'll use this afternoon to set up home  oxygen and a home health needs. We'll also have diabetes education. I will switch her from nebulizers to inhalers.  A close followup with Dr. Domenic Polite at the [facility] office, and will also need close follow up with her PCP.  Leonie Man, M.D., M.S. Interventional Cardiologist  [place] Pager # [phone] 03/28/2013

## 2013-03-28 NOTE — Progress Notes (Signed)
Physical Therapy Treatment Patient Details Name: [name] [name] MRN: [id number] DOB: 02-04-1945 Today's Date: 03/28/2013 Time: 0910-0933 PT Time Calculation (min): 23 min  PT Assessment / Plan / Recommendation  History of Present Illness 68 year old female with prior history of coronary artery disease and ischemic cardiomyopathy status post coronary artery bypass grafting in 2012 who presented on transfer from [place] with chest pain, acute heart failure, and mild troponin elevation. She has been feeling sick for 1 week. CP, difficulty breathing. Denied fever. CHF and flu (+)   PT Comments   Patient unsteady at times walking without device feel mostly due to right foot pain.  Encouraged cane use at home to improve stability, decrease fall risk.  Also discussed cardiac rehab to educate in activity progression following MI/heart cath.  Follow Up Recommendations  No PT follow up     Does the patient have the potential to tolerate intense rehabilitation   N/A  Barriers to Discharge  None      Equipment Recommendations  None recommended by PT    Recommendations for Other Services  None  Frequency Min 3X/week   Progress towards PT Goals Progress towards PT goals: Progressing toward goals  Plan Current plan remains appropriate    Precautions / Restrictions Precautions Precautions: Fall   Pertinent Vitals/Pain 3/10 in feet Rt>Lt with ambulation    Mobility  Bed Mobility Overal bed mobility: Modified Independent Transfers Overall transfer level: Modified independent Ambulation/Gait Ambulation/Gait assistance: Min guard;Min assist Ambulation Distance (Feet): 250 Feet Assistive device: None Gait Pattern/deviations: Step-through pattern;Antalgic;Decreased stride length General Gait Details: loss of balance on turns wtih min assist to recover; demonstrates decreased stance time on right due to cellulitic pain      PT Goals (current goals can now be found in the care plan  section)    Visit Information  Last PT Received On: 03/28/13 Assistance Needed: +1 History of Present Illness: 68 year old female with prior history of coronary artery disease and ischemic cardiomyopathy status post coronary artery bypass grafting in 2012 who presented on transfer from [place] with chest pain, acute heart failure, and mild troponin elevation. She has been feeling sick for 1 week. CP, difficulty breathing. Denied fever. CHF and flu (+)    Subjective Data   Just haven't walked a lot.   Cognition  Cognition Arousal/Alertness: Awake/alert Behavior During Therapy: WFL for tasks assessed/performed Overall Cognitive Status: Within Functional Limits for tasks assessed    Balance  Balance Sitting balance-Leahy Scale: Good Standing balance support: No upper extremity supported Standing balance-Leahy Scale: Good  End of Session PT - End of Session Equipment Utilized During Treatment: Gait belt;Oxygen Activity Tolerance: Patient tolerated treatment well Patient left: in chair;with call bell/phone within reach   GP     [facility] 03/28/2013, 10:39 AM Magda Kiel, PT [phone] 03/28/2013

## 2013-03-28 NOTE — Progress Notes (Signed)
CARDIAC REHAB PHASE I   SATURATION QUALIFICATIONS: (This note is used to comply with regulatory documentation for home oxygen)  Patient Saturations on Room Air at Rest = 94%  Patient Saturations on Room Air while Ambulating = 82%  Patient Saturations on 2 Liters of oxygen while Ambulating = 92%  Please briefly explain why patient needs home oxygen: Pt O2 desaturated while walking 175 ft, up with 2L. Total 350 ft with assist x1. No major c/o. Needs home O2 and HHRN. Ed completed, reinforcement taking meds and good communication with MD if too expensive. Gave walking gl, diet, HF booklet. Pt interested in CRPII and requests her name be sent to [facility].  [phone]  Josephina Shih [place] CES, ACSM 03/28/2013 10:44 AM

## 2013-03-29 ENCOUNTER — Other Ambulatory Visit: Payer: Self-pay | Admitting: Cardiology

## 2013-03-29 LAB — GLUCOSE, CAPILLARY: Glucose-Capillary: 133 mg/dL — ABNORMAL HIGH (ref 70–99)

## 2013-03-29 MED ORDER — ALBUTEROL SULFATE (2.5 MG/3ML) 0.083% IN NEBU: 2.5000 mg | INHALATION_SOLUTION | RESPIRATORY_TRACT | Status: DC | PRN

## 2013-03-29 MED ORDER — IPRATROPIUM-ALBUTEROL 0.5-2.5 (3) MG/3ML IN SOLN
3.0000 mL | Freq: Four times a day (QID) | RESPIRATORY_TRACT | Status: DC
  Administered 2013-03-29: 08:00:00 3 mL via RESPIRATORY_TRACT
  Filled 2013-03-29: qty 3

## 2013-03-29 MED ORDER — MICONAZOLE NITRATE 2 % EX CREA: TOPICAL_CREAM | Freq: Two times a day (BID) | CUTANEOUS | Status: DC

## 2013-03-29 MED ORDER — INSULIN DETEMIR 100 UNIT/ML ~~LOC~~ SOLN: 10.0000 [IU] | Freq: Every day | SUBCUTANEOUS | Status: DC

## 2013-03-29 MED ORDER — LISINOPRIL 20 MG PO TABS: 20.0000 mg | ORAL_TABLET | Freq: Two times a day (BID) | ORAL | Status: DC

## 2013-03-29 MED ORDER — ASPIRIN 81 MG PO TBEC: 81.0000 mg | DELAYED_RELEASE_TABLET | Freq: Every day | ORAL | Status: DC

## 2013-03-29 MED ORDER — CARVEDILOL 12.5 MG PO TABS: 6.2500 mg | ORAL_TABLET | Freq: Two times a day (BID) | ORAL | Status: DC

## 2013-03-29 MED ORDER — ATORVASTATIN CALCIUM 40 MG PO TABS: 40.0000 mg | ORAL_TABLET | Freq: Every day | ORAL | Status: DC

## 2013-03-29 MED ORDER — FUROSEMIDE 40 MG PO TABS: 40.0000 mg | ORAL_TABLET | Freq: Every day | ORAL | Status: DC

## 2013-03-29 MED ORDER — INSULIN ASPART 100 UNIT/ML ~~LOC~~ SOLN: 3.0000 [IU] | Freq: Three times a day (TID) | SUBCUTANEOUS | Status: DC

## 2013-03-29 MED ORDER — CLOPIDOGREL BISULFATE 75 MG PO TABS: 75.0000 mg | ORAL_TABLET | Freq: Every day | ORAL | Status: DC

## 2013-03-29 NOTE — Discharge Summary (Signed)
Physician Discharge Summary  Patient ID: [name] [name] MRN: [id number] DOB/AGE: [birth year] 68 y.o.  Primary Cardiologist: Dr. Domenic Polite   Admit date: 03/18/2013 Discharge date: 03/30/2013  Admission Diagnoses: Acute on Chronic Combined Systolic and Diastolic CHF  Discharge Diagnoses:  Principal Problem:   Acute on chronic combined systolic and diastolic congestive heart failure, NYHA class 4 - EF 30%; h/o CABG Active Problems:   Carotid artery disease   Coronary atherosclerosis of native coronary artery   Diabetes mellitus type II, uncontrolled   Chest pain with moderate risk for cardiac etiology - mild troponin bump initially (likely related to CHF);   UTI (urinary tract infection) - on day 6 of Cipro    Influenza B   Troponin level elevated   Hypoxia - with exertion   Non-STEMI (non-ST elevated myocardial infarction) - 80% lesion in SVG-diagonal treated with DES stent   Discharged Condition: stable  Hospital Course: The patient is a 68 year old female with a prior history of coronary artery disease, status post coronary artery bypass grafting x 4 in November 2012, along with an ischemic cardiopathy and EF of 40-45%. She also has peripheral arterial disease and carotid arterial disease, status post bilateral carotid endarterectomies, also in November 2012. She has poorly controlled diabetes mellitus and has been off all medications for at least the past year she says because of cost. She has been followed by Dr. Domenic Polite in our [place] office.  Despite her noncompliance, she has done reasonably well over the past few years. However, Approximately 3-4 weeks prior to arrival, she began to experience progressive dyspnea on exertion, eventually leading to dyspnea at rest, orthopnea, lower extremity edema, and increasing abdominal girth. She had also developed a constant lower chest and epigastric discomfort that was worse activity as well as with deep breathing, and palpation.  Initial EKG at  [facility] revealed no acute ST or T changes. Troponin was mildly elevated at 0.09 and BNP was elevated at 1469. LFTs were mildly elevated (her statin was temporarily discontinued). Chest x-ray suggested pulmonary edema. D-dimer was elevated and a CT angiography of the chest showed no evidence of pulmonary embolism. Urinalysis suggested UTI. Given the complexity of her medical condition, it was felt best to transfer her to [facility] for further care.   On arrival, she was placed on IV diuretics. She was started on antibiotics for her UTI. A 2D echo was obtained, which demonstrated a drop in systolic function, from 80-99% to 30%.  She was also noted to have diffuse hypokinesis and elevated LA pressures. Based on these findings, a R/LHC was recommneded. However, it was decided to wait until she was near euvolemic state to pursue the study. She was started on a BB and ACE-I. She had good diuresis with IV Lasix, however she began to shows signs of decompensation.  She continued to have dyspnea and developed mild respiratory distress with hypoxia, fever, chills and profound wheezing. Rapid response was consulted to evaluate. Due to her increased work of breathing and change in O2 sats, it was decided to transfer her to a higher unit of care. She was transferred from telemetry to stepdown. Further work up revealed that she was positive for influenza B. She was provided supportive therapy. Once she improved from a viral standpoint, and after she diurese, she underwent both a R/LHC, performed by Dr. Angelena Form. She underwent PCI + DES to the distal body of the SVG-Diagonal.  SVG-OM and SVG-PDA were all patent. She was continued on ASA + Plavix. She  continued to show signs of improvement. She reached euvolemic state, she had no further CP and no further resting dyspnea. She was noted to have drops in her O2 stats with ambulation. Thus, home O2 was recommended. Physical therapy also recommended a Home Health RN to aid with her  transition to home.   On hospital day 11, she was seen and examined by Dr. Stanford Breed, who determined she was stable for discharge home. Home O2 and a HH RN were both arranged prior to discharge. She will follow-up with Dr. Domenic Polite in 2-4 weeks in [place]. She was given prescription for a BMP to check renal function and potassium levels in 1 week. She will need repeat echocardiogram 3 months after medications are fully titrated. If ejection fraction is less than 35%, she would need an ICD. It should also be noted that she had an abnormal chest CT prior to admission in [place] with mediastinal adenopathy. Dr. Stanford Breed has recommneded repeat noncontrast chest CT in 3 months. Repeat carotid Dopplers as an outpatient.   Consults: none  Significant Diagnostic Studies:   [facility] 03/27/13 Hemodynamic Findings:  Ao: 173/71  LV: 172/9/20  RA: 7  RV: 41/2/6  PA: 46/18 (mean 24)  PCWP: 9  Fick Cardiac Output: 4.88 L/min  Fick Cardiac Index: 2.48 L/min/m2  Central Aortic Saturation: 99%  Pulmonary Artery Saturation: 65%   Angiographic Findings:  Left main: No left main. The LAD and Circumflex arise from different ostia.  Left Anterior Descending Artery: Large caliber vessel with diffuse 40% proximal and mid stenosis followed by 99% sub-total occlusion mid vessel. The mid and distal vessel fills from the patent IMA graft. The moderate caliber diagonal branch has proximal 90% stenosis.  Circumflex Artery: Moderate caliber diffusely diseased vessel with serial 99% stenoses throughout the proximal and mid segment. The obtuse marginal branch has an ostial 99% stenosis and fills retrograde from a patent vein graft.  Right Coronary Artery: Moderate caliber diffusely diseased vessel with 80% stenosis throughout the proximal and mid segments. The distal vessel fills from antegrade flow and from the patent vein graft.  Graft Anatomy:  SVG to Diagonal is patent with 90% stenosis distal body of graft  SVG to OM is patent   SVG to PDA is patent  LIMA to mid LAD is patent  Left Ventricular Angiogram: Deferred.   Impression:  1. Severe triple vessel CAD s/p 4V CABG with 4/4 patent grafts  2. High grade stenosis distal body of SVG to Diagonal  3. Successful PTCA/DES x 1 distal body of SVG to Diagonal  Treatments: See Hospital Course  Discharge Exam: Blood pressure 155/74, pulse 64, temperature 97.7 F (36.5 C), temperature source Oral, resp. rate 18, height _0  (1.676 m), weight 192 lb 10.9 oz (87.4 kg), SpO2 98.00%.   Disposition: 01-Home or Self Care      Discharge Orders   Future Orders Complete By Expires   Amb Referral to Cardiac Rehabilitation  As directed    Ambulatory referral to Nutrition and Diabetic Education  As directed    Comments:     Type 2 diabetes. Starting on insulin.   Diet - low sodium heart healthy  As directed    Diet - low sodium heart healthy  As directed    Face-to-face encounter (required for Medicare/Medicaid patients)  As directed    Comments:     I SIMMONS, BRITTAINY certify that this patient is under my care and that I, or a nurse practitioner or physician's assistant working with  me, had a face-to-face encounter that meets the physician face-to-face encounter requirements with this patient on 03/29/2013. The encounter with the patient was in whole, or in part for the following medical condition(s) which is the primary reason for home health care (List medical condition): heart failure, decline in physical condition   Questions:     The encounter with the patient was in whole, or in part, for the following medical condition, which is the primary reason for home health care:  heart failure; weakness   I certify that, based on my findings, the following services are medically necessary home health services:  Nursing   My clinical findings support the need for the above services:  Shortness of breath with activity   Further, I certify that my clinical findings support that  this patient is homebound due to:  Shortness of Breath with activity   Reason for Medically Necessary Home Health Services:  Skilled Nursing- Change/Decline in Patient Status   For home use only DME oxygen  As directed    Questions:     Mode or (Route):  Nasal cannula   Liters per Minute:     Frequency:     Oxygen conserving device:  Yes   Home Health  As directed    Questions:     To provide the following care/treatments:  RN   Increase activity slowly  As directed    Increase activity slowly  As directed        Medication List         aspirin 81 MG EC tablet  Take 1 tablet (81 mg total) by mouth daily.     atorvastatin 40 MG tablet  Commonly known as:  LIPITOR  Take 1 tablet (40 mg total) by mouth daily at 6 PM.     carvedilol 12.5 MG tablet  Commonly known as:  COREG  Take 0.5 tablets (6.25 mg total) by mouth 2 (two) times daily with a meal.     clopidogrel 75 MG tablet  Commonly known as:  PLAVIX  Take 1 tablet (75 mg total) by mouth daily with breakfast.     furosemide 40 MG tablet  Commonly known as:  LASIX  Take 1 tablet (40 mg total) by mouth daily.     insulin aspart 100 UNIT/ML injection  Commonly known as:  novoLOG  Inject 3 Units into the skin 3 (three) times daily with meals.     insulin detemir 100 UNIT/ML injection  Commonly known as:  LEVEMIR  Inject 0.1 mLs (10 Units total) into the skin at bedtime.     lisinopril 20 MG tablet  Commonly known as:  PRINIVIL,ZESTRIL  Take 1 tablet (20 mg total) by mouth 2 (two) times daily.     miconazole 2 % cream  Commonly known as:  MICOTIN  Apply topically 2 (two) times daily.     multivitamin with minerals Tabs tablet  Take 1 tablet by mouth daily.       Follow-up Information   Follow up with [facility]. (oxygen supplies)    Contact information:   [address] [place] [address] [phone]       Follow up with [facility]. (home health nurse for  congestive heart failure disease management)    Contact information:   [address] [place] [address] [phone]       Follow up with Rozann Lesches, MD. (our office will call you with an appointment)  Specialty:  Cardiology   Contact information:   [place] [address] [phone]      TIME SPENT ON DISCHARGE, INCLUDING PHYSICIAN TIME: >30 MINUTES   Signed: Lyda Jester 03/30/2013, 11:47 AM

## 2013-03-29 NOTE — Progress Notes (Signed)
68 year old female with prior history of coronary artery disease and ischemic cardiomyopathy status post coronary artery bypass grafting in 2012 who presented on transfer from [facility] with chest pain, acute heart failure, and mild troponin elevation. She has been feeling sick for 1 week. CP, difficulty breathing. Denied fever.  3/12 -- acutely decompensated with fever, chills, profound wheezing. She is + for Influenza B. With Nebs, feeling better.  On IV Heparin since admission - CE now negative & CP more MSK related.  S/P LHC yesterday resulting in PCI w/ DED x 1 to distal body of SVG-Diagonal   Subjective: No complaints. Feeling much better.   Objective: Vital signs in last 24 hours: Temp:  [97.7 F (36.5 C)-99 F (37.2 C)] 97.7 F (36.5 C) (03/21 0835) Pulse Rate:  [62-87] 64 (03/21 0835) Resp:  [13-18] 18 (03/21 0835) BP: (141-221)/(43-74) 155/74 mmHg (03/21 0835) SpO2:  [91 %-98 %] 98 % (03/21 0835) Weight:  [192 lb 10.9 oz (87.4 kg)-425 lb 7.8 oz (193 kg)] 192 lb 10.9 oz (87.4 kg) (03/20 2350) Last BM Date: 03/26/13  Intake/Output from previous day: 03/20 0701 - 03/21 0700 In: 1560 [P.O.:1560] Out: 1400 [Urine:1400] Intake/Output this shift: Total I/O In: -  Out: 500 [Urine:500]  Medications Current Facility-Administered Medications  Medication Dose Route Frequency Provider Last Rate Last Dose  . 0.9 %  sodium chloride infusion  250 mL Intravenous PRN Rogelia Mire, NP   250 mL at 03/24/13 2000  . albuterol (PROVENTIL) (2.5 MG/3ML) 0.083% nebulizer solution 2.5 mg  2.5 mg Nebulization Q2H PRN Jolaine Artist, MD      . aspirin EC tablet 81 mg  81 mg Oral Daily Rogelia Mire, NP   81 mg at 03/28/13 1001  . atorvastatin (LIPITOR) tablet 40 mg  40 mg Oral q1800 Leonie Man, MD   40 mg at 03/28/13 1900  . carvedilol (COREG) tablet 6.25 mg  6.25 mg Oral BID WC Leonie Man, MD   6.25 mg at 03/28/13 1800  . clopidogrel (PLAVIX) tablet 75 mg   75 mg Oral Q breakfast Burnell Blanks, MD   75 mg at 03/28/13 1001  . furosemide (LASIX) tablet 40 mg  40 mg Oral Daily Leonie Man, MD      . hydrALAZINE (APRESOLINE) injection 10 mg  10 mg Intravenous Q6H PRN Tarri Fuller, PA-C   10 mg at 03/27/13 1735  . insulin aspart (novoLOG) injection 0-15 Units  0-15 Units Subcutaneous TID WC Rogelia Mire, NP   5 Units at 03/28/13 1900  . insulin aspart (novoLOG) injection 3 Units  3 Units Subcutaneous TID WC Leonie Man, MD   3 Units at 03/28/13 1300  . insulin detemir (LEVEMIR) injection 10 Units  10 Units Subcutaneous QHS Leonie Man, MD   10 Units at 03/28/13 2155  . ipratropium-albuterol (DUONEB) 0.5-2.5 (3) MG/3ML nebulizer solution 3 mL  3 mL Inhalation QID Jolaine Artist, MD   3 mL at 03/29/13 0817  . lisinopril (PRINIVIL,ZESTRIL) tablet 20 mg  20 mg Oral BID Leonie Man, MD   20 mg at 03/28/13 1900  . miconazole (MICOTIN) 2 % cream   Topical BID Jolaine Artist, MD      . morphine 2 MG/ML injection 2 mg  2 mg Intravenous Q3H PRN Cecilie Kicks, NP   2 mg at 03/28/13 0514  . multivitamin with minerals tablet 1 tablet  1 tablet Oral Daily Rogelia Mire, NP  1 tablet at 03/28/13 1001  . ondansetron (ZOFRAN) injection 4 mg  4 mg Intravenous Q6H PRN Brittainy Simmons, PA-C   4 mg at 03/27/13 2015  . sodium chloride 0.9 % injection 3 mL  3 mL Intravenous Q12H Rogelia Mire, NP   3 mL at 03/28/13 2156  . sodium chloride 0.9 % injection 3 mL  3 mL Intravenous PRN Rogelia Mire, NP        PE: General appearance: alert, cooperative and no distress Lungs: faint expiratory wheezing in RLL Heart: regular rate and rhythm, S1, S2 normal, no murmur, click, rub or gallop Extremities: trace bilateral LEE Pulses: 2+ and symmetric Skin: warm and dry Neurologic: Grossly normal  Lab Results:   Recent Labs  03/28/13 0505  WBC 7.5  HGB 12.2  HCT 38.3  PLT 321   BMET  Recent Labs  03/27/13 0305  03/28/13 0505  NA 136* 137  K 3.5* 4.0  CL 92* 95*  CO2 34* 31  GLUCOSE 142* 210*  BUN 20 20  CREATININE 0.90 0.93  CALCIUM 9.3 9.3   PT/INR  Recent Labs  03/27/13 0305  LABPROT 13.2  INR 1.02     Assessment/Plan    Principal Problem:   Acute on chronic combined systolic and diastolic congestive heart failure, NYHA class 4 - EF 30%; h/o CABG Active Problems:   Carotid artery disease   Coronary atherosclerosis of native coronary artery   Diabetes mellitus type II, uncontrolled   Chest pain with moderate risk for cardiac etiology - mild troponin bump initially (likely related to CHF);   UTI (urinary tract infection) - on day 6 of Cipro    Influenza B   Troponin level elevated   Hypoxia - with exertion   Non-STEMI (non-ST elevated myocardial infarction) - 80% lesion in SVG-diagonal treated with DES stent  Plan: overall condition much improved. She appears euvolemic. No further chest pain. Breathing improved. Will plan for discharge home today. Plans are in place of [facility] RN and home O2. MD to follow.     LOS: 11 days    Brittainy M. Rosita Fire, PA-C 03/29/2013 8:47 AM  As above, patient seen and examined. She denies chest pain or dyspnea. Right groin shows no hematoma and no bruit. Blood pressure remains elevated. Increase carvedilol to 12.5 mg by mouth twice a day. Continue aspirin and Plavix for recent PCI. Continue statin. LV function is reduced. Continue beta blocker and ACE inhibitor. Continue present dose of Lasix. Euvolemic on examination. Discharge today and followup with Dr. Domenic Polite in 2-4 weeks. Check potassium and renal function in one week. She will need repeat echocardiogram 3 months after medications fully titrated. Ejection fraction less than 35% would need ICD. She had an abnormal chest CT prior to admission in [place] with mediastinal adenopathy. Would repeat noncontrast chest CT in 3 months. Repeat carotid Dopplers as an outpatient. > 30 min PA and physician  time D2 Kirk Ruths

## 2013-03-29 NOTE — Progress Notes (Signed)
   CARE MANAGEMENT NOTE 03/29/2013  Patient:  [name] [name], [name] [name]   Account Number:  [id number]  Date Initiated:  03/19/2013  Documentation initiated by:  GRAVES-BIGELOW,BRENDA  Subjective/Objective Assessment:   Pt presents on transfer from [facility] with chest pain, acute heart failure, and mild troponin elevation. Pt states she lives alone and that she is having a hard time affording co pays for medications.     Action/Plan:   CM unable to assist with meds due to insurance at this time. Pt would benefit from all generics if possible. Pt will enefit from [facility] once stable for d/c. May benefit from PT consult as well. At visit pt was nauseated & unable to talk much.   Anticipated DC Date:  03/21/2013   Anticipated DC Plan:  [place]  CM consult      [facility] Choice  DURABLE MEDICAL EQUIPMENT   Choice offered to / List presented to:     DME arranged  [facility]      DME agency  [place] arranged  HH-1 RN  [place].   Status of service:  Completed, signed off Medicare Important Message given?   (If response is "NO", the following Medicare IM given date fields will be blank) Date Medicare IM given:   Date Additional Medicare IM given:    Discharge Disposition:  [place]  Per UR Regulation:  Reviewed for med. necessity/level of care/duration of stay  If discussed at [place] of Stay Meetings, dates discussed:   03/25/2013  03/27/2013    Comments:  03/29/13 09:15 CM spoke with pt in room to offer choice.  Pt chooses [facility] for [facility] for CHF disease mgmt.  Address and contact number verified with pt.  Referral texted to [facility] liason, [place].  02 already in room.  No other CM needs were communicated. Mariane Masters, BSN, [place].  03/28/13 [place], RN,BSN, [place] [phone] Pt qualifies for home oxygen.  DME oxygen ordered  through [facility]; Jermaine notified that pt aticiapted d/c is tomorrow. Medication assistance not available as pt has insurance. Pt made aware.  Suggest changing pharmacy to [facility] or [facility] to take advantage of $4 medications.  03/27/13 [place] MSN BSN CCM Cath completed: Successful PTCA/DES x 1 distal body of SVG to Diagonal.  Pt will be on Plavix which is now generic.

## 2013-03-30 NOTE — Discharge Summary (Signed)
See progress notes Kirk Ruths

## 2013-03-31 DIAGNOSIS — I5043 Acute on chronic combined systolic (congestive) and diastolic (congestive) heart failure: Secondary | ICD-10-CM

## 2013-03-31 DIAGNOSIS — Z48812 Encounter for surgical aftercare following surgery on the circulatory system: Secondary | ICD-10-CM

## 2013-03-31 DIAGNOSIS — I2589 Other forms of chronic ischemic heart disease: Secondary | ICD-10-CM

## 2013-03-31 DIAGNOSIS — I509 Heart failure, unspecified: Secondary | ICD-10-CM

## 2013-04-17 ENCOUNTER — Ambulatory Visit (INDEPENDENT_AMBULATORY_CARE_PROVIDER_SITE_OTHER): Payer: Medicare Other | Admitting: Cardiology

## 2013-04-17 ENCOUNTER — Encounter: Payer: Self-pay | Admitting: Cardiology

## 2013-04-17 VITALS — BP 201/115 | HR 86 | Ht 67.0 in | Wt 196.8 lb

## 2013-04-17 DIAGNOSIS — I2510 Atherosclerotic heart disease of native coronary artery without angina pectoris: Secondary | ICD-10-CM

## 2013-04-17 DIAGNOSIS — I5042 Chronic combined systolic (congestive) and diastolic (congestive) heart failure: Secondary | ICD-10-CM

## 2013-04-17 DIAGNOSIS — Z9119 Patient's noncompliance with other medical treatment and regimen: Secondary | ICD-10-CM | POA: Insufficient documentation

## 2013-04-17 DIAGNOSIS — I10 Essential (primary) hypertension: Secondary | ICD-10-CM

## 2013-04-17 DIAGNOSIS — E782 Mixed hyperlipidemia: Secondary | ICD-10-CM

## 2013-04-17 DIAGNOSIS — Z91199 Patient's noncompliance with other medical treatment and regimen due to unspecified reason: Secondary | ICD-10-CM | POA: Insufficient documentation

## 2013-04-17 MED ORDER — AMLODIPINE BESYLATE 5 MG PO TABS: 5.0000 mg | ORAL_TABLET | Freq: Every day | ORAL | Status: DC

## 2013-04-17 MED ORDER — CARVEDILOL 12.5 MG PO TABS: 12.5000 mg | ORAL_TABLET | Freq: Two times a day (BID) | ORAL | Status: DC

## 2013-04-17 NOTE — Assessment & Plan Note (Signed)
No active angina symptoms. She is status post prior CABG, recent DES to the SVG to diagonal. Continues on aspirin and Plavix.

## 2013-04-17 NOTE — Assessment & Plan Note (Signed)
LVEF was recently assessed at 30% by echocardiogram in March. Probably component of ischemic heart disease and also noncompliance with medical therapy and hypertension. Will aim to further optimize medical regimen and plan to followup of LVEF later.

## 2013-04-17 NOTE — Progress Notes (Signed)
Clinical Summary Ms. Starliper is a medically complex 68 y.o.female that I have not seen in the office since March 2013. Recent records reviewed including hospitalization [facility] with NSTEMI and acute on chronic combined heart failure in the setting of medication noncompliance (had been off all medications for at least a year). Also found to be positive for influenza B. She had a prolonged hospital stay, ultimately underwent right and left heart catheterization and placement of DES to the distal body of the SVG to diagonal, other grafts were patent. Medical therapy was adjusted and she was sent home with home health.  Echocardiogram from this March showed LVEF now in the range of 30% with moderate chamber dilatation and diffuse hypokinesis, moderate left atrial enlargement, PASP 37 mm mercury.  Records received just recently from home health documenting significant blood pressure elevations with systolics in the 076 to 191 range. Blood pressure is elevated again today. Ms. Gutt tells me that she has been taking her medications as directed and has not been missing doses. She reports no chest pain symptoms. Breathing status has been stable. Has had trouble with back pain recently. Using a rolling walker. No headaches or visual changes.  I underscored the importance of medical therapy, particularly her DAPT in light of recent DES placement.   Allergies  Allergen Reactions  . Other Shortness Of Breath and Rash    All berries   . Strawberry Hives, Shortness Of Breath and Rash  . Sulfa Antibiotics Swelling    Current Outpatient Prescriptions  Medication Sig Dispense Refill  . aspirin EC 81 MG EC tablet Take 1 tablet (81 mg total) by mouth daily.      [name] atorvastatin (LIPITOR) 40 MG tablet Take 1 tablet (40 mg total) by mouth daily at 6 PM.  30 tablet  5  . carvedilol (COREG) 12.5 MG tablet Take 1 tablet (12.5 mg total) by mouth 2 (two) times daily with a meal.  60 tablet  6  . clopidogrel  (PLAVIX) 75 MG tablet Take 1 tablet (75 mg total) by mouth daily with breakfast.  30 tablet  5  . furosemide (LASIX) 40 MG tablet Take 1 tablet (40 mg total) by mouth daily.  30 tablet  5  . HYDROcodone-acetaminophen (NORCO/VICODIN) 5-325 MG per tablet Take 1 tablet by mouth every 6 (six) hours as needed for moderate pain (back).      . insulin aspart (NOVOLOG) 100 UNIT/ML injection Inject 3 Units into the skin 3 (three) times daily with meals.  10 mL  11  . insulin detemir (LEVEMIR) 100 UNIT/ML injection Inject 0.1 mLs (10 Units total) into the skin at bedtime.  10 mL  11  . lisinopril (PRINIVIL,ZESTRIL) 20 MG tablet Take 1 tablet (20 mg total) by mouth 2 (two) times daily.  30 tablet  5  . Multiple Vitamin (MULTIVITAMIN WITH MINERALS) TABS tablet Take 1 tablet by mouth daily.      . predniSONE (STERAPRED UNI-PAK) 10 MG tablet 6 day pack for back      . tiZANidine (ZANAFLEX) 4 MG tablet Take 4 mg by mouth 2 (two) times daily.      [name] amLODipine (NORVASC) 5 MG tablet Take 1 tablet (5 mg total) by mouth daily.  180 tablet  3   No current facility-administered medications for this visit.    Past Medical History  Diagnosis Date  . Coronary atherosclerosis of native coronary artery     a. 11/2010 NSTEMI/CABG x 4: L-LAD; S-PL; S-OM;  S-DX, by PVT.  [name] Chronic combined systolic and diastolic CHF (congestive heart failure)     a. 11/2010 TEE: EF 40-45%, no veg or LA thrombus.  . Mixed hyperlipidemia   . Essential hypertension, benign   . Depression   . Cellulitis     a. Recurrent, bilateral legs  . Arthritis   . Carotid artery disease     a. Bilateral CEA; 50-60% bilateral ICA stenosis, 11/12  . PAD (peripheral artery disease)   . Type 2 diabetes mellitus   . Suicide attempt 08/2002  . TIA (transient ischemic attack)   . TMJ syndrome   . Stroke 08/2009  . Herniated disc   . Ischemic cardiomyopathy     a. 11/2010 TEE: EF 40-45%.  . Myocardial infarction   . [id number](276.7)     Social  History Ms. Strome reports that she quit smoking about 3 years ago. Her smoking use included Cigarettes. She has a 50 pack-year smoking history. She has never used smokeless tobacco. Ms. Upperman reports that she drinks alcohol.  Review of Systems No palpitations or syncope. No reported orthopnea or PND. Does have intermittent leg edema.  Physical Examination Filed Vitals:   04/17/13 1458  BP: 201/115  Pulse: 86   Filed Weights   04/17/13 1450  Weight: 196 lb 12.8 oz (89.268 kg)    Overweight, chronically ill-appearing woman in no acute distress.  HEENT: Conjunctiva and lids normal, oropharynx clear.  Neck: Supple, no elevated JVP, bilateral CEA scars, no thyromegaly.  Lungs: Clear to auscultation, decreased at bases, nonlabored breathing at rest.  Thorax: Well-healed midline sternal incision.  Cardiac: Regular rate and rhythm, no S3, soft systolic murmur, no pericardial rub.  Abdomen: Soft, nontender, bowel sounds present, no guarding or rebound.  Extremities: 1+ edema, mild stasis, distal pulses 1-2+.  Skin: Warm and dry.  Musculoskeletal: No kyphosis.  Neuropsychiatric: Alert and oriented x3, affect grossly appropriate.   Problem List and Plan   Hypertension, uncontrolled Significant increase in blood pressure trend recently. She has a history of noncompliance, but tells me that she has been taking her medications regularly. We will increase Coreg to 12.5 mg twice daily and add Norvasc 5 mg daily. Otherwise continue present regimen. Keep follow up with home health nursing, see her back within a month.  Coronary atherosclerosis of native coronary artery No active angina symptoms. She is status post prior CABG, recent DES to the SVG to diagonal. Continues on aspirin and Plavix.  Mixed hyperlipidemia Now taking Lipitor. Had been off for a least a year. Will plan to followup lipids down the road.  Noncompliance Addressed this again today. Patient states she has been taking her  medications.  Chronic combined systolic and diastolic heart failure LVEF was recently assessed at 30% by echocardiogram in March. Probably component of ischemic heart disease and also noncompliance with medical therapy and hypertension. Will aim to further optimize medical regimen and plan to followup of LVEF later.    Satira Sark, M.D., F.A.C.C.

## 2013-04-17 NOTE — Patient Instructions (Signed)
Your physician recommends that you schedule a follow-up appointment in: 1 month. Your physician has recommended you make the following change in your medication:  Increase your carvedilol to 12.5 mg twice daily. Start amlodipine 5 mg daily. Your new prescriptions have been sent to your pharmacy. All other medications will remain the same. Your physician recommends that you have lab work in 1 month just before your next visit to check your BMET.

## 2013-04-17 NOTE — Assessment & Plan Note (Signed)
Significant increase in blood pressure trend recently. She has a history of noncompliance, but tells me that she has been taking her medications regularly. We will increase Coreg to 12.5 mg twice daily and add Norvasc 5 mg daily. Otherwise continue present regimen. Keep follow up with home health nursing, see her back within a month.

## 2013-04-17 NOTE — Assessment & Plan Note (Signed)
Addressed this again today. Patient states she has been taking her medications.

## 2013-04-17 NOTE — Assessment & Plan Note (Signed)
Now taking Lipitor. Had been off for a least a year. Will plan to followup lipids down the road.

## 2013-05-01 ENCOUNTER — Encounter: Payer: Medicare Other | Admitting: Cardiology

## 2013-05-07 ENCOUNTER — Ambulatory Visit: Payer: Medicare Other | Admitting: Dietician

## 2013-05-20 ENCOUNTER — Telehealth: Payer: Self-pay | Admitting: *Deleted

## 2013-05-20 ENCOUNTER — Encounter: Payer: Self-pay | Admitting: Cardiology

## 2013-05-20 ENCOUNTER — Ambulatory Visit (INDEPENDENT_AMBULATORY_CARE_PROVIDER_SITE_OTHER): Payer: Medicare Other | Admitting: Cardiology

## 2013-05-20 VITALS — BP 193/73 | HR 70 | Ht 67.0 in | Wt 187.1 lb

## 2013-05-20 DIAGNOSIS — I5042 Chronic combined systolic (congestive) and diastolic (congestive) heart failure: Secondary | ICD-10-CM

## 2013-05-20 DIAGNOSIS — I2510 Atherosclerotic heart disease of native coronary artery without angina pectoris: Secondary | ICD-10-CM

## 2013-05-20 MED ORDER — CARVEDILOL 12.5 MG PO TABS: 12.5000 mg | ORAL_TABLET | Freq: Two times a day (BID) | ORAL | Status: DC

## 2013-05-20 MED ORDER — FUROSEMIDE 40 MG PO TABS: 60.0000 mg | ORAL_TABLET | Freq: Every morning | ORAL | Status: DC

## 2013-05-20 MED ORDER — POTASSIUM CHLORIDE ER 10 MEQ PO TBCR: 10.0000 meq | EXTENDED_RELEASE_TABLET | Freq: Every day | ORAL | Status: DC

## 2013-05-20 MED ORDER — LISINOPRIL 20 MG PO TABS: 20.0000 mg | ORAL_TABLET | Freq: Two times a day (BID) | ORAL | Status: DC

## 2013-05-20 MED ORDER — AMLODIPINE BESYLATE 10 MG PO TABS: 10.0000 mg | ORAL_TABLET | Freq: Every day | ORAL | Status: DC

## 2013-05-20 NOTE — Patient Instructions (Signed)
Your physician recommends that you schedule a follow-up appointment in: 1 month. Your physician has recommended you make the following change in your medication:  Increase amlodipine to 10 mg daily. You may take (2) of your 5 mg tablets daily until they are finished. Your new prescription and refills was sent to your pharmacy. Continue all other medications the same.

## 2013-05-20 NOTE — Progress Notes (Signed)
Clinical Summary Ms. Zelek is a medically 68 y.o.female last seen in April of this year. She denies any chest pain or progressive shortness of breath, still using oxygen when she ambulates. She states that she has been compliant with her medications, home health nursing ends next week. Blood pressure seems to be trending down but is still not optimally controlled.  Echocardiogram from this March showed LVEF now in the range of 30% with moderate chamber dilatation and diffuse hypokinesis, moderate left atrial enlargement, PASP 37 mm mercury. As of March she is status post DES to the distal body of the SVG to diagonal, other grafts were found to be patent.  We reviewed her medications and anticipated adjustments.   Allergies  Allergen Reactions  . Other Shortness Of Breath and Rash    All berries   . Strawberry Hives, Shortness Of Breath and Rash  . Sulfa Antibiotics Swelling    Current Outpatient Prescriptions  Medication Sig Dispense Refill  . aspirin EC 81 MG EC tablet Take 1 tablet (81 mg total) by mouth daily.      [name] atorvastatin (LIPITOR) 40 MG tablet Take 1 tablet (40 mg total) by mouth daily at 6 PM.  30 tablet  5  . carvedilol (COREG) 12.5 MG tablet Take 1 tablet (12.5 mg total) by mouth 2 (two) times daily with a meal.  180 tablet  3  . clopidogrel (PLAVIX) 75 MG tablet Take 1 tablet (75 mg total) by mouth daily with breakfast.  30 tablet  5  . furosemide (LASIX) 40 MG tablet Take 1.5 tablets (60 mg total) by mouth every morning.  135 tablet  3  . HYDROcodone-acetaminophen (NORCO/VICODIN) 5-325 MG per tablet Take 1 tablet by mouth every 6 (six) hours as needed for moderate pain (back).      . insulin aspart (NOVOLOG) 100 UNIT/ML injection Inject 8 Units into the skin 3 (three) times daily with meals.      . insulin detemir (LEVEMIR) 100 UNIT/ML injection Inject 27 Units into the skin at bedtime.      [name] lisinopril (PRINIVIL,ZESTRIL) 20 MG tablet Take 1 tablet (20 mg total) by  mouth 2 (two) times daily.  180 tablet  3  . Multiple Vitamin (MULTIVITAMIN WITH MINERALS) TABS tablet Take 1 tablet by mouth daily.      [name] amLODipine (NORVASC) 10 MG tablet Take 1 tablet (10 mg total) by mouth daily.  180 tablet  3   No current facility-administered medications for this visit.    Past Medical History  Diagnosis Date  . Coronary atherosclerosis of native coronary artery     a. 11/2010 NSTEMI/CABG x 4: L-LAD; S-PL; S-OM; S-DX, by PVT.  [name] Chronic combined systolic and diastolic CHF (congestive heart failure)     a. 11/2010 TEE: EF 40-45%, no veg or LA thrombus.  . Mixed hyperlipidemia   . Essential hypertension, benign   . Depression   . Cellulitis     a. Recurrent, bilateral legs  . Arthritis   . Carotid artery disease     a. Bilateral CEA; 50-60% bilateral ICA stenosis, 11/12  . PAD (peripheral artery disease)   . Type 2 diabetes mellitus   . Suicide attempt 08/2002  . TIA (transient ischemic attack)   . TMJ syndrome   . Stroke 08/2009  . Herniated disc   . Ischemic cardiomyopathy     a. 11/2010 TEE: EF 40-45%.  . Myocardial infarction   . Headache(784.0)     Past  Surgical History  Procedure Laterality Date  . Appendectomy    . Cholecystectomy    . Abdominal hysterectomy    . Carotid endarterectomy      Bilateral - Dr. Kellie Simmering  . Left wrist bone graft    . Coronary artery bypass graft  11/24/2010    Procedure: CORONARY ARTERY BYPASS GRAFTING (CABG);  Surgeon: Tharon Aquas Adelene Idler, MD;  Location: [place];  Service: Open Heart Surgery;  Laterality: N/A;  Coronary Artery Bypass Graft times four on pump utilizing left internal mammary artery and bilateral greater saphenous veins harvested endoscopically, transesophageal echocardiogram   . Toe surgery      LARGE TOE LEFT FOOT     Social History Ms. Deneault reports that she quit smoking about 3 years ago. Her smoking use included Cigarettes. She has a 50 pack-year smoking history. She has never used smokeless  tobacco. Ms. Altamura reports that she drinks alcohol.  Review of Systems Negative except as outlined.  Physical Examination Filed Vitals:   05/20/13 1047  BP: 193/73  Pulse: 70   Filed Weights   05/20/13 1047  Weight: 187 lb 1.9 oz (84.877 kg)    Overweight, chronically ill-appearing woman in no acute distress.  HEENT: Conjunctiva and lids normal, oropharynx clear.  Neck: Supple, no elevated JVP, bilateral CEA scars, no thyromegaly.  Lungs: Clear to auscultation, decreased at bases, nonlabored breathing at rest.  Thorax: Well-healed midline sternal incision.  Cardiac: Regular rate and rhythm, no S3, soft systolic murmur, no pericardial rub.  Abdomen: Soft, nontender, bowel sounds present, no guarding or rebound.  Extremities: Trace, mild stasis, distal pulses 1-2+.  Skin: Warm and dry.  Musculoskeletal: No kyphosis.  Neuropsychiatric: Alert and oriented x3, affect grossly appropriate.   Problem List and Plan   Hypertension, uncontrolled Increase Norvasc to 10 mg daily and continue other medications.  Coronary atherosclerosis of native coronary artery No active angina symptoms. She is status post prior CABG, recent DES to the SVG to diagonal. Continues on aspirin and Plavix.  Chronic combined systolic and diastolic heart failure LVEF was recently assessed at 30% by echocardiogram in March. Probably component of ischemic heart disease and also noncompliance with medical therapy and hypertension. Will aim to further optimize medical regimen and plan to followup of LVEF later. Willl likely increase Coreg further at her next visit.    Satira Sark, M.D., F.A.C.C.

## 2013-05-20 NOTE — Assessment & Plan Note (Signed)
Increase Norvasc to 10 mg daily and continue other medications.

## 2013-05-20 NOTE — Assessment & Plan Note (Signed)
No active angina symptoms. She is status post prior CABG, recent DES to the SVG to diagonal. Continues on aspirin and Plavix. 

## 2013-05-20 NOTE — Telephone Encounter (Signed)
Message copied by Merlene Laughter on Tue May 20, 2013 11:35 AM ------      Message from: MCDOWELL, Aloha Gell      Created: Mon May 19, 2013  1:41 PM       Noted. Renal function is normal, although her potassium is mildly low at 3.4. She continues on Lasix, likely needs to be on KCL 10 mEq daily. ------

## 2013-05-20 NOTE — Telephone Encounter (Signed)
Patient informed.

## 2013-05-20 NOTE — Assessment & Plan Note (Signed)
LVEF was recently assessed at 30% by echocardiogram in March. Probably component of ischemic heart disease and also noncompliance with medical therapy and hypertension. Will aim to further optimize medical regimen and plan to followup of LVEF later. Willl likely increase Coreg further at her next visit.

## 2013-06-10 ENCOUNTER — Telehealth: Payer: Self-pay | Admitting: Cardiology

## 2013-06-10 NOTE — Telephone Encounter (Signed)
She wants to know if you received a certificate of medical necessity for her oxygen. If so,please send back asap. If not,please let her know so she can fax you another one.

## 2013-06-10 NOTE — Telephone Encounter (Signed)
Spoke to [place]-- RN informed Loma Sousa, to please send to Dr [employer] office in [place]. He is the patient primary cardiologist. Loma Sousa verbalized understanding.

## 2013-06-24 ENCOUNTER — Encounter: Payer: Self-pay | Admitting: Cardiology

## 2013-06-24 NOTE — Progress Notes (Signed)
Clinical Summary Ms. Icenhower is a 68 y.o.female last seen in May of this year. Interval records indicate admission at [facility] with left foot infection, no clear evidence of osteomyelitis by MRI. She states she underwent debridement and is completing antibiotics, has followup with surgery.  Recent lab work in early June showed BUN 23, creatinine 0.9, potassium 4.0, hemoglobin 10.7, platelets 255.  Echocardiogram from this March showed LVEF now in the range of 30% with moderate chamber dilatation and diffuse hypokinesis, moderate left atrial enlargement, PASP 37 mm mercury. As of March she is status post DES to the distal body of the SVG to diagonal, other grafts were found to be patent.  We reviewed her medications. She reports low blood pressures in the mornings, sometimes systolics in the 287G. We discussed adjusting medications Although she wanted to hold off making any changes until she is over her foot infection, thinking that pain associated with this may be increasing her blood pressure somewhat.   Allergies  Allergen Reactions  . Other Shortness Of Breath and Rash    All berries   . Strawberry Hives, Shortness Of Breath and Rash  . Sulfa Antibiotics Swelling    Current Outpatient Prescriptions  Medication Sig Dispense Refill  . amLODipine (NORVASC) 10 MG tablet Take 1 tablet (10 mg total) by mouth daily.  180 tablet  3  . aspirin EC 81 MG EC tablet Take 1 tablet (81 mg total) by mouth daily.      [name] atorvastatin (LIPITOR) 40 MG tablet Take 1 tablet (40 mg total) by mouth daily at 6 PM.  30 tablet  5  . carvedilol (COREG) 12.5 MG tablet Take 1 tablet (12.5 mg total) by mouth 2 (two) times daily with a meal.  180 tablet  3  . clindamycin (CLEOCIN) 300 MG capsule Take 300 mg by mouth 4 (four) times daily.      . clopidogrel (PLAVIX) 75 MG tablet Take 1 tablet (75 mg total) by mouth daily with breakfast.  30 tablet  5  . furosemide (LASIX) 40 MG tablet Take 1.5 tablets (60 mg total)  by mouth every morning.  135 tablet  3  . gabapentin (NEURONTIN) 300 MG capsule Take 300 mg by mouth at bedtime.      [name] glimepiride (AMARYL) 2 MG tablet Take 2 mg by mouth 2 (two) times daily.      . insulin aspart (NOVOLOG) 100 UNIT/ML injection Inject 8 Units into the skin 3 (three) times daily with meals.      . insulin detemir (LEVEMIR) 100 UNIT/ML injection Inject 32 Units into the skin at bedtime.       [name] lisinopril (PRINIVIL,ZESTRIL) 20 MG tablet Take 1 tablet (20 mg total) by mouth 2 (two) times daily.  180 tablet  3  . Multiple Vitamin (MULTIVITAMIN WITH MINERALS) TABS tablet Take 1 tablet by mouth daily.      . potassium chloride (K-DUR) 10 MEQ tablet Take 1 tablet (10 mEq total) by mouth daily.  90 tablet  3   No current facility-administered medications for this visit.    Past Medical History  Diagnosis Date  . Coronary atherosclerosis of native coronary artery     a. 11/2010 NSTEMI/CABG x 4: L-LAD; S-PL; S-OM; S-DX, by PVT.  [name] Chronic combined systolic and diastolic CHF (congestive heart failure)     a. 11/2010 TEE: EF 40-45%, no veg or LA thrombus.  . Mixed hyperlipidemia   . Essential hypertension, benign   . Depression   .  Cellulitis     a. Recurrent, bilateral legs  . Arthritis   . Carotid artery disease     a. Bilateral CEA; 50-60% bilateral ICA stenosis, 11/12  . PAD (peripheral artery disease)   . Type 2 diabetes mellitus   . Suicide attempt 08/2002  . TIA (transient ischemic attack)   . TMJ syndrome   . Stroke 08/2009  . Herniated disc   . Ischemic cardiomyopathy     a. 11/2010 TEE: EF 40-45%.  . Myocardial infarction   . IHKVQQVZ(563.8)     Past Surgical History  Procedure Laterality Date  . Appendectomy    . Cholecystectomy    . Abdominal hysterectomy    . Carotid endarterectomy      Bilateral - Dr. Kellie Simmering  . Left wrist bone graft    . Coronary artery bypass graft  11/24/2010    Procedure: CORONARY ARTERY BYPASS GRAFTING (CABG);  Surgeon: Tharon Aquas  Adelene Idler, MD;  Location: [place];  Service: Open Heart Surgery;  Laterality: N/A;  Coronary Artery Bypass Graft times four on pump utilizing left internal mammary artery and bilateral greater saphenous veins harvested endoscopically, transesophageal echocardiogram   . Toe surgery      LARGE TOE LEFT FOOT     Social History Ms. Burnside reports that she quit smoking about 3 years ago. Her smoking use included Cigarettes. She has a 50 pack-year smoking history. She has never used smokeless tobacco. Ms. Ferran reports that she drinks alcohol.  Review of Systems Had no palpitations or syncope. No fevers or chills. Using a rolling walker. Has her left foot in a support shoe. Continues to wear oxygen. Other systems reviewed and negative.  Physical Examination Filed Vitals:   06/25/13 1133  BP: 156/54  Pulse: 53   Filed Weights   06/25/13 1133  Weight: 195 lb 12.8 oz (88.814 kg)    Overweight, chronically ill-appearing woman in no acute distress.  HEENT: Conjunctiva and lids normal, oropharynx clear.  Neck: Supple, no elevated JVP, bilateral CEA scars, no thyromegaly.  Lungs: Clear to auscultation, decreased at bases, nonlabored breathing at rest.  Thorax: Well-healed midline sternal incision.  Cardiac: Regular rate and rhythm, no S3, soft systolic murmur, no pericardial rub.  Abdomen: Soft, nontender, bowel sounds present, no guarding or rebound.  Extremities: Trace, mild stasis, distal pulses 1-2+. Left foot in support shoe. Skin: Warm and dry.  Musculoskeletal: No kyphosis.  Neuropsychiatric: Alert and oriented x3, affect grossly appropriate.   Problem List and Plan   Coronary atherosclerosis of native coronary artery No active angina symptoms. She is status post prior CABG, and DES to the SVG to diagonal. Continues on aspirin and Plavix.  Hypertension, uncontrolled Blood pressure trend is improving. I asked her to continue to check home blood pressures, we may need to add  hydralazine next. Followup arranged.  Chronic combined systolic and diastolic heart failure Continue medical therapy, possibly to add hydralazine/nitrate next.    Satira Sark, M.D., F.A.C.C.

## 2013-06-25 ENCOUNTER — Ambulatory Visit (INDEPENDENT_AMBULATORY_CARE_PROVIDER_SITE_OTHER): Payer: Medicare Other | Admitting: Cardiology

## 2013-06-25 ENCOUNTER — Encounter: Payer: Self-pay | Admitting: Cardiology

## 2013-06-25 VITALS — BP 156/54 | HR 53 | Ht 66.0 in | Wt 195.8 lb

## 2013-06-25 DIAGNOSIS — I5042 Chronic combined systolic (congestive) and diastolic (congestive) heart failure: Secondary | ICD-10-CM

## 2013-06-25 DIAGNOSIS — I2510 Atherosclerotic heart disease of native coronary artery without angina pectoris: Secondary | ICD-10-CM

## 2013-06-25 DIAGNOSIS — I10 Essential (primary) hypertension: Secondary | ICD-10-CM

## 2013-06-25 NOTE — Assessment & Plan Note (Signed)
No active angina symptoms. She is status post prior CABG, and DES to the SVG to diagonal. Continues on aspirin and Plavix.

## 2013-06-25 NOTE — Assessment & Plan Note (Signed)
Continue medical therapy, possibly to add hydralazine/nitrate next.

## 2013-06-25 NOTE — Patient Instructions (Signed)
Your physician recommends that you schedule a follow-up appointment in: 6 weeks. Your physician recommends that you continue on your current medications as directed. Please refer to the Current Medication list given to you today.

## 2013-06-25 NOTE — Assessment & Plan Note (Signed)
Blood pressure trend is improving. I asked her to continue to check home blood pressures, we may need to add hydralazine next. Followup arranged.

## 2013-07-09 ENCOUNTER — Ambulatory Visit: Payer: Medicare Other | Admitting: Cardiovascular Disease

## 2013-07-10 ENCOUNTER — Encounter (HOSPITAL_COMMUNITY): Payer: Medicare Other

## 2013-07-21 ENCOUNTER — Telehealth: Payer: Self-pay | Admitting: *Deleted

## 2013-07-21 NOTE — Telephone Encounter (Signed)
Per home health nurse, today at 8:45 am patient's BP was 210/90 & HR 85 and regular. Patient took her am meds at 6:00 am today. No c/o chest pain, dizziness or sob. All cardiac medications reviewed and confirmed with home health nurse. Per home health nurse, patient c/o not sleeping well last night and is also in a lot of pain from her left foot ulcer. Patient was seen last week on 07/18/13 and her BP 142/82, also on last Wednesday 07/16/13 138/82 and on 07/11/13 134/62.

## 2013-07-21 NOTE — Telephone Encounter (Signed)
Could potentially be secondary to pain. Somewhat reluctant to further advance her antihypertensives in light of recently documented reasonable blood pressure control with systolics in the 669P to 675U. Would keep an eye on this, can always make further changes if needed.

## 2013-07-22 NOTE — Telephone Encounter (Signed)
Home health nurse informed via vm.

## 2013-07-28 ENCOUNTER — Encounter: Payer: Self-pay | Admitting: Cardiovascular Disease

## 2013-07-28 ENCOUNTER — Telehealth: Payer: Self-pay | Admitting: Cardiology

## 2013-07-28 NOTE — Telephone Encounter (Signed)
No answer at patient home.  Patient has upcoming OV scheduled with Dr. Domenic Polite this Wednesday.

## 2013-07-28 NOTE — Telephone Encounter (Signed)
Sulma from [facility] called wanting to let Dr.McDowell know that Mrs.Hocevar has been having chest pains off and on  For approximately one week now. States that sometimes she has pain in her right hand and shoulder also.

## 2013-07-30 ENCOUNTER — Encounter: Payer: Self-pay | Admitting: Cardiology

## 2013-07-30 ENCOUNTER — Ambulatory Visit (INDEPENDENT_AMBULATORY_CARE_PROVIDER_SITE_OTHER): Payer: Medicare Other | Admitting: Cardiology

## 2013-07-30 VITALS — BP 147/61 | HR 78 | Ht 66.0 in | Wt 198.0 lb

## 2013-07-30 DIAGNOSIS — I5042 Chronic combined systolic (congestive) and diastolic (congestive) heart failure: Secondary | ICD-10-CM

## 2013-07-30 DIAGNOSIS — I25119 Atherosclerotic heart disease of native coronary artery with unspecified angina pectoris: Secondary | ICD-10-CM

## 2013-07-30 DIAGNOSIS — E782 Mixed hyperlipidemia: Secondary | ICD-10-CM

## 2013-07-30 DIAGNOSIS — I2510 Atherosclerotic heart disease of native coronary artery without angina pectoris: Secondary | ICD-10-CM

## 2013-07-30 DIAGNOSIS — I209 Angina pectoris, unspecified: Secondary | ICD-10-CM

## 2013-07-30 DIAGNOSIS — I10 Essential (primary) hypertension: Secondary | ICD-10-CM

## 2013-07-30 MED ORDER — NITROGLYCERIN 0.4 MG SL SUBL: 0.4000 mg | SUBLINGUAL_TABLET | SUBLINGUAL | Status: DC | PRN

## 2013-07-30 MED ORDER — CARVEDILOL 12.5 MG PO TABS: 18.7500 mg | ORAL_TABLET | Freq: Two times a day (BID) | ORAL | Status: DC

## 2013-07-30 MED ORDER — CLOPIDOGREL BISULFATE 75 MG PO TABS: 75.0000 mg | ORAL_TABLET | Freq: Every day | ORAL | Status: DC

## 2013-07-30 NOTE — Patient Instructions (Addendum)
Your physician recommends that you schedule a follow-up appointment in: 3 months. You will receive a reminder letter in the mail in about 1-2 months reminding you to call and schedule your appointment. If you don't receive this letter, please contact our office. Your physician has recommended you make the following change in your medication:  Increase carvedilol 12.5 mg to 1&1/2 tablets twice daily. Start nitroglycerin 0.4 mg sublingual for severe chest pain every 5 minutes up to 3 doses. If no relief after 3rd dose, proceed to the ED for an evaluation. Plavix refill sent to your pharmacy. Continue all other medications the same.

## 2013-07-30 NOTE — Assessment & Plan Note (Signed)
No change in current diuretic regimen.

## 2013-07-30 NOTE — Assessment & Plan Note (Signed)
She continues on Lipitor.

## 2013-07-30 NOTE — Assessment & Plan Note (Signed)
Agree with recent addition of Imdur to her regimen. Will increase Coreg to 12.5 mg one and a half tablets twice daily for now. This can be further advanced depending on heart rate and blood pressure. Also given prescription for sublingual nitroglycerin. Plavix was refilled as well. Plan to continue medical therapy unless symptoms escalate. Followup scheduled for 3 months.

## 2013-07-30 NOTE — Progress Notes (Signed)
Clinical Summary [name] [name] is a medically complex 68 y.o.female last seen in June. Interval telephone notes reviewed. Blood pressure has been intermittently elevated and she has also had chest pain. I also see that she was just recently admitted to [facility], discharged on July 21. She was seen by the [facility] cardiology practice. Cardiac markers were negative arguing against ACS. Imdur was added to her regimen, Coreg was to be increased to 25 mg twice daily, although the patient did not make this change. She also may have run out of Plavix. Today we reviewed her medications in detail. She is also now on an antidepressant.  Echocardiogram from this March showed LVEF now in the range of 30% with moderate chamber dilatation and diffuse hypokinesis, moderate left atrial enlargement, PASP 37 mm mercury. As of March she is status post DES to the distal body of the SVG to diagonal, other grafts were found to be patent.   Allergies  Allergen Reactions  . Other Shortness Of Breath and Rash    All berries   . Strawberry Hives, Shortness Of Breath and Rash  . Sulfa Antibiotics Swelling    Current Outpatient Prescriptions  Medication Sig Dispense Refill  . amLODipine (NORVASC) 10 MG tablet Take 1 tablet (10 mg total) by mouth daily.  180 tablet  3  . aspirin EC 81 MG EC tablet Take 1 tablet (81 mg total) by mouth daily.      [name] atorvastatin (LIPITOR) 40 MG tablet Take 1 tablet (40 mg total) by mouth daily at 6 PM.  30 tablet  5  . citalopram (CELEXA) 20 MG tablet Take 20 mg by mouth daily.      . clopidogrel (PLAVIX) 75 MG tablet Take 1 tablet (75 mg total) by mouth daily with breakfast.  30 tablet  6  . furosemide (LASIX) 40 MG tablet Take 1.5 tablets (60 mg total) by mouth every morning.  135 tablet  3  . gabapentin (NEURONTIN) 300 MG capsule Take 300 mg by mouth at bedtime.      [name] glimepiride (AMARYL) 2 MG tablet Take 2 mg by mouth 2 (two) times daily.      . insulin aspart (NOVOLOG) 100  UNIT/ML injection Inject 8 Units into the skin 3 (three) times daily with meals.      . insulin detemir (LEVEMIR) 100 UNIT/ML injection Inject 32 Units into the skin at bedtime.       . isosorbide mononitrate (IMDUR) 30 MG 24 hr tablet Take 30 mg by mouth daily.      [name] lisinopril (PRINIVIL,ZESTRIL) 20 MG tablet Take 1 tablet (20 mg total) by mouth 2 (two) times daily.  180 tablet  3  . Multiple Vitamin (MULTIVITAMIN WITH MINERALS) TABS tablet Take 1 tablet by mouth daily.      . potassium chloride (K-DUR) 10 MEQ tablet Take 1 tablet (10 mEq total) by mouth daily.  90 tablet  3  . carvedilol (COREG) 12.5 MG tablet Take 1.5 tablets (18.75 mg total) by mouth 2 (two) times daily.  270 tablet  3  . nitroGLYCERIN (NITROSTAT) 0.4 MG SL tablet Place 1 tablet (0.4 mg total) under the tongue every 5 (five) minutes as needed for chest pain. Up to 3 doses. If no relief after 3rd dose, proceed to the ED for an evaluation  25 tablet  3   No current facility-administered medications for this visit.    Past Medical History  Diagnosis Date  . Coronary atherosclerosis of native  coronary artery     a. 11/2010 NSTEMI/CABG x 4: L-LAD; S-PL; S-OM; S-DX, by PVT.  [name] Chronic combined systolic and diastolic CHF (congestive heart failure)     a. 11/2010 TEE: EF 40-45%, no veg or LA thrombus.  . Mixed hyperlipidemia   . Essential hypertension, benign   . Depression   . Cellulitis     a. Recurrent, bilateral legs  . Arthritis   . Carotid artery disease     a. Bilateral CEA; 50-60% bilateral ICA stenosis, 11/12  . PAD (peripheral artery disease)   . Type 2 diabetes mellitus   . Suicide attempt 08/2002  . TIA (transient ischemic attack)   . TMJ syndrome   . Stroke 08/2009  . Herniated disc   . Ischemic cardiomyopathy     a. 11/2010 TEE: EF 40-45%.  . Myocardial infarction   . YLUDAPTC(052.5)     Past Surgical History  Procedure Laterality Date  . Appendectomy    . Cholecystectomy    . Abdominal  hysterectomy    . Carotid endarterectomy      Bilateral - Dr. Kellie Simmering  . Left wrist bone graft    . Coronary artery bypass graft  11/24/2010    Procedure: CORONARY ARTERY BYPASS GRAFTING (CABG);  Surgeon: [name] Aquas Adelene Idler, MD;  Location: [facility];  Service: Open Heart Surgery;  Laterality: N/A;  Coronary Artery Bypass Graft times four on pump utilizing left internal mammary artery and bilateral greater saphenous veins harvested endoscopically, transesophageal echocardiogram   . Toe surgery      LARGE TOE LEFT FOOT     Social History [name] [name] reports that she quit smoking about 3 years ago. Her smoking use included Cigarettes. She has a 50 pack-year smoking history. She has never used smokeless tobacco. [name] [name] reports that she drinks alcohol.  Review of Systems No palpitations or dizziness. States she has been having intermittent crying spells. No fevers or chills. Stable appetite. Otherwise negative.  Physical Examination Filed Vitals:   07/30/13 0904  BP: 147/61  Pulse: 78    Overweight, chronically ill-appearing woman in no acute distress.  HEENT: Conjunctiva and lids normal, oropharynx clear.  Neck: Supple, no elevated JVP, bilateral CEA scars, no thyromegaly.  Lungs: Clear to auscultation, decreased at bases, nonlabored breathing at rest.  Thorax: Well-healed midline sternal incision.  Cardiac: Regular rate and rhythm, no S3, soft systolic murmur, no pericardial rub.  Abdomen: Soft, nontender, bowel sounds present, no guarding or rebound.  Extremities: Trace, mild stasis, distal pulses 1-2+. Left foot in support shoe.  Skin: Warm and dry.  Musculoskeletal: No kyphosis.  Neuropsychiatric: Alert and oriented x3, affect grossly appropriate.   Problem List and Plan   Coronary atherosclerosis of native coronary artery Agree with recent addition of Imdur to her regimen. Will increase Coreg to 12.5 mg one and a half tablets twice daily for now. This can be further advanced  depending on heart rate and blood pressure. Also given prescription for sublingual nitroglycerin. Plavix was refilled as well. Plan to continue medical therapy unless symptoms escalate. Followup scheduled for 3 months.  Hypertension, uncontrolled Continue to adjust medical regimen for more optimal control. Trend has been better.  Chronic combined systolic and diastolic heart failure No change in current diuretic regimen.  Mixed hyperlipidemia She continues on Lipitor.    Satira Sark, M.D., F.A.C.C.

## 2013-07-30 NOTE — Assessment & Plan Note (Signed)
Continue to adjust medical regimen for more optimal control. Trend has been better.

## 2013-08-18 ENCOUNTER — Encounter: Payer: Self-pay | Admitting: Cardiovascular Disease

## 2013-08-18 ENCOUNTER — Ambulatory Visit (INDEPENDENT_AMBULATORY_CARE_PROVIDER_SITE_OTHER): Payer: Medicare Other | Admitting: Cardiovascular Disease

## 2013-08-18 VITALS — BP 176/70 | HR 54 | Ht 66.0 in | Wt 211.8 lb

## 2013-08-18 DIAGNOSIS — D689 Coagulation defect, unspecified: Secondary | ICD-10-CM

## 2013-08-18 DIAGNOSIS — I2510 Atherosclerotic heart disease of native coronary artery without angina pectoris: Secondary | ICD-10-CM

## 2013-08-18 DIAGNOSIS — I739 Peripheral vascular disease, unspecified: Secondary | ICD-10-CM

## 2013-08-18 DIAGNOSIS — Z79899 Other long term (current) drug therapy: Secondary | ICD-10-CM

## 2013-08-18 NOTE — Progress Notes (Signed)
08/18/2013 Tenna Child Blaker   09/07/1945  [id number]  Primary Physician Glenda Chroman., MD Primary Cardiologist: [name] MD [name]   HPI:  [name] is a 68 year old moderately overweight Caucasian female mother of 4 children, grandmother of the grandchildren referred by Dr. Ladona Horns for critical limb ischemia. Her primary care physician is Dr. Allie Dimmer cardiovascular risk factor profile is remarkable for 100-150-pack-years of tobacco abuse having quit 3 years ago prior to her bypass graft surgery. She has treated hypertension, diabetes and hyperlipidemia. Her father died of a myocardial infarction. She has had a gait MRI as well as a stroke. She a priori bypass grafting x4 in 2004 by Dr. Tharon Aquas Trigt  She's also had bilateral carotid endarterectomies. She has had a nonhealing ulcer on the plantar surface of her left foot for 6 months. Recent Dopplers are positive vertigo performed at [facility] revealed a right ABI of 0.76 and a left ABI of 0.48.   Current Outpatient Prescriptions  Medication Sig Dispense Refill  . amLODipine (NORVASC) 10 MG tablet Take 1 tablet (10 mg total) by mouth daily.  180 tablet  3  . aspirin EC 81 MG EC tablet Take 1 tablet (81 mg total) by mouth daily.      [name] atorvastatin (LIPITOR) 40 MG tablet Take 1 tablet (40 mg total) by mouth daily at 6 PM.  30 tablet  5  . carvedilol (COREG) 12.5 MG tablet Take 1.5 tablets (18.75 mg total) by mouth 2 (two) times daily.  270 tablet  3  . citalopram (CELEXA) 20 MG tablet Take 20 mg by mouth daily.      . clopidogrel (PLAVIX) 75 MG tablet Take 1 tablet (75 mg total) by mouth daily with breakfast.  30 tablet  6  . furosemide (LASIX) 40 MG tablet Take 1.5 tablets (60 mg total) by mouth every morning.  135 tablet  3  . glimepiride (AMARYL) 2 MG tablet Take 2 mg by mouth daily with breakfast.      . HYDROcodone-acetaminophen (NORCO/VICODIN) 5-325 MG per tablet Take 1 tablet by mouth as needed.      .  insulin aspart (NOVOLOG) 100 UNIT/ML injection Inject 8 Units into the skin 3 (three) times daily with meals.      . insulin detemir (LEVEMIR) 100 UNIT/ML injection Inject 36 Units into the skin at bedtime.       . isosorbide mononitrate (IMDUR) 30 MG 24 hr tablet Take 30 mg by mouth daily.      [name] lisinopril (PRINIVIL,ZESTRIL) 20 MG tablet Take 1 tablet (20 mg total) by mouth 2 (two) times daily.  180 tablet  3  . Multiple Vitamin (MULTIVITAMIN WITH MINERALS) TABS tablet Take 1 tablet by mouth daily.      . nitroGLYCERIN (NITROSTAT) 0.4 MG SL tablet Place 1 tablet (0.4 mg total) under the tongue every 5 (five) minutes as needed for chest pain. Up to 3 doses. If no relief after 3rd dose, proceed to the ED for an evaluation  25 tablet  3  . potassium chloride (K-DUR) 10 MEQ tablet Take 1 tablet (10 mEq total) by mouth daily.  90 tablet  3   No current facility-administered medications for this visit.    Allergies  Allergen Reactions  . Other Shortness Of Breath and Rash    All berries   . Strawberry Hives, Shortness Of Breath and Rash  . Sulfa Antibiotics Swelling    History   Social History  .  Marital Status: Widowed    Spouse Name: N/A    Number of Children: N/A  . Years of Education: N/A   Occupational History  . Not on file.   Social History Main Topics  . Smoking status: Former Smoker -- 1.00 packs/day for 50 years    Types: Cigarettes    Quit date: 10/09/2009  . Smokeless tobacco: Never Used     Comment: SMOKED FOR 50 YEARS - up to 2 packs/day for much of that time.  . Alcohol Use: Yes     Comment: Very rare.  . Drug Use: No  . Sexual Activity: Not on file   Other Topics Concern  . Not on file   Social History Narrative   Lives in [place] by herself.  She does not work.     Review of Systems: General: negative for chills, fever, night sweats or weight changes.  Cardiovascular: negative for chest pain, dyspnea on exertion, edema, orthopnea, palpitations, paroxysmal  nocturnal dyspnea or shortness of breath Dermatological: negative for rash Respiratory: negative for cough or wheezing Urologic: negative for hematuria Abdominal: negative for nausea, vomiting, diarrhea, bright red blood per rectum, melena, or hematemesis Neurologic: negative for visual changes, syncope, or dizziness All other systems reviewed and are otherwise negative except as noted above.    Blood pressure 176/70, pulse 54, height _0  (1.676 m), weight 211 lb 12.8 oz (96.072 kg).  General appearance: alert and no distress Neck: no adenopathy, no JVD, supple, symmetrical, trachea midline, thyroid not enlarged, symmetric, no tenderness/mass/nodules and soft bilateral carotid bruits Lungs: clear to auscultation bilaterally Heart: regular rate and rhythm, S1, S2 normal, no murmur, click, rub or gallop Extremities: tense edema in her lower extremities right greater than left with venous stasis changes. There are no palpable pedal pulses. She has her on a plantar surface of her left foot.  EKG not performed today  ASSESSMENT AND PLAN:   PAD (peripheral artery disease) Patient has a 5-6 month history of a ischemic ulcer on the plantar surface of her left foot under her fourth metatarsal head. She had Doppler studies performed at [facility] 06/13/13 which revealed a left ABI of 0.40 right of 0.76. She did complain of a history of claudication left greater than right. I would repeat her lower extremity arterial Doppler studies and arrange for her to undergo angiography and potential intervention for limb salvage.  Carotid artery disease History of bilateral carotid endarterectomies and possibly 4-5 years ago. She has bilateral carotid bruits. I'm going to repeat her carotid Doppler studies.      [name] MD FACP,FACC,FAHA, [facility] 08/18/2013 11:27 AM

## 2013-08-18 NOTE — Patient Instructions (Signed)
Dr. Gwenlyn Found has ordered a peripheral angiogram to be done at [facility].  This procedure is going to look at the bloodflow in your lower extremities.  If Dr. Gwenlyn Found is able to open up the arteries, you will have to spend one night in the hospital.  If he is not able to open the arteries, you will be able to go home that same day.    After the procedure, you will not be allowed to drive for 3 days or push, pull, or lift anything greater than 10 lbs for one week.    You will be required to have the following tests prior to the procedure:  1. Blood work-the blood work can be done no more than 7 days prior to the procedure.  It can be done at any [facility] lab.  There is one downstairs on the first floor of this building and one in the [place] ([address])  2. Chest Xray-the chest xray order has already been placed at the [place] [facility].     *REPS TBA  lower extremity arterial doppler- During this test, ultrasound is used to evaluate arterial blood flow in the legs. Allow approximately one hour for this exam.   Carotid Duplex- This test is an ultrasound of the carotid arteries in your neck. It looks at blood flow through these arteries that supply the brain with blood. Allow one hour for this exam. There are no restrictions or special instructions.

## 2013-08-18 NOTE — Assessment & Plan Note (Signed)
Patient has a 5-6 month history of a ischemic ulcer on the plantar surface of her left foot under her fourth metatarsal head. She had Doppler studies performed at [facility] 06/13/13 which revealed a left ABI of 0.40 right of 0.76. She did complain of a history of claudication left greater than right. I would repeat her lower extremity arterial Doppler studies and arrange for her to undergo angiography and potential intervention for limb salvage.

## 2013-08-18 NOTE — Assessment & Plan Note (Signed)
History of bilateral carotid endarterectomies and possibly 4-5 years ago. She has bilateral carotid bruits. I'm going to repeat her carotid Doppler studies.

## 2013-08-25 ENCOUNTER — Telehealth (HOSPITAL_COMMUNITY): Payer: Self-pay | Admitting: *Deleted

## 2013-08-27 ENCOUNTER — Ambulatory Visit (HOSPITAL_BASED_OUTPATIENT_CLINIC_OR_DEPARTMENT_OTHER)
Admission: RE | Admit: 2013-08-27 | Discharge: 2013-08-27 | Disposition: A | Discharge status: Home / Self Care | Payer: Medicare Other | Source: Ambulatory Visit | Attending: Cardiovascular Disease | Admitting: Cardiovascular Disease

## 2013-08-27 ENCOUNTER — Ambulatory Visit (HOSPITAL_COMMUNITY)
Admission: RE | Admit: 2013-08-27 | Discharge: 2013-08-27 | Disposition: A | Discharge status: Home / Self Care | Payer: Medicare Other | Source: Ambulatory Visit | Attending: Cardiology | Admitting: Cardiology

## 2013-08-27 DIAGNOSIS — I739 Peripheral vascular disease, unspecified: Secondary | ICD-10-CM | POA: Diagnosis not present

## 2013-08-27 DIAGNOSIS — I6529 Occlusion and stenosis of unspecified carotid artery: Secondary | ICD-10-CM

## 2013-08-27 DIAGNOSIS — I7092 Chronic total occlusion of artery of the extremities: Secondary | ICD-10-CM | POA: Diagnosis not present

## 2013-08-27 DIAGNOSIS — L98499 Non-pressure chronic ulcer of skin of other sites with unspecified severity: Secondary | ICD-10-CM | POA: Diagnosis present

## 2013-08-27 DIAGNOSIS — R0989 Other specified symptoms and signs involving the circulatory and respiratory systems: Secondary | ICD-10-CM

## 2013-08-27 DIAGNOSIS — I70219 Atherosclerosis of native arteries of extremities with intermittent claudication, unspecified extremity: Secondary | ICD-10-CM

## 2013-08-27 DIAGNOSIS — I779 Disorder of arteries and arterioles, unspecified: Secondary | ICD-10-CM

## 2013-08-27 NOTE — Progress Notes (Signed)
Carotid Duplex Completed. Oda Cogan, BS, RDMS, RVT

## 2013-08-27 NOTE — Progress Notes (Signed)
Arterial Duplex Lower Ext. Completed. Carsynn Bethune, BS, RDMS, RV

## 2013-09-01 DIAGNOSIS — R0989 Other specified symptoms and signs involving the circulatory and respiratory systems: Secondary | ICD-10-CM | POA: Diagnosis not present

## 2013-09-01 DIAGNOSIS — Z87891 Personal history of nicotine dependence: Secondary | ICD-10-CM | POA: Diagnosis not present

## 2013-09-01 DIAGNOSIS — I739 Peripheral vascular disease, unspecified: Secondary | ICD-10-CM | POA: Diagnosis not present

## 2013-09-01 DIAGNOSIS — Z8249 Family history of ischemic heart disease and other diseases of the circulatory system: Secondary | ICD-10-CM | POA: Diagnosis not present

## 2013-09-01 DIAGNOSIS — Z951 Presence of aortocoronary bypass graft: Secondary | ICD-10-CM | POA: Diagnosis not present

## 2013-09-01 DIAGNOSIS — I998 Other disorder of circulatory system: Secondary | ICD-10-CM | POA: Diagnosis present

## 2013-09-01 DIAGNOSIS — E663 Overweight: Secondary | ICD-10-CM | POA: Diagnosis not present

## 2013-09-01 DIAGNOSIS — I10 Essential (primary) hypertension: Secondary | ICD-10-CM | POA: Diagnosis not present

## 2013-09-01 DIAGNOSIS — E119 Type 2 diabetes mellitus without complications: Secondary | ICD-10-CM | POA: Diagnosis not present

## 2013-09-01 DIAGNOSIS — Z794 Long term (current) use of insulin: Secondary | ICD-10-CM | POA: Diagnosis not present

## 2013-09-01 DIAGNOSIS — Z7902 Long term (current) use of antithrombotics/antiplatelets: Secondary | ICD-10-CM | POA: Diagnosis not present

## 2013-09-01 DIAGNOSIS — Z6831 Body mass index (BMI) 31.0-31.9, adult: Secondary | ICD-10-CM | POA: Diagnosis not present

## 2013-09-01 DIAGNOSIS — Z7982 Long term (current) use of aspirin: Secondary | ICD-10-CM | POA: Diagnosis not present

## 2013-09-01 DIAGNOSIS — L97409 Non-pressure chronic ulcer of unspecified heel and midfoot with unspecified severity: Secondary | ICD-10-CM | POA: Diagnosis not present

## 2013-09-01 DIAGNOSIS — E785 Hyperlipidemia, unspecified: Secondary | ICD-10-CM | POA: Diagnosis not present

## 2013-09-02 ENCOUNTER — Encounter: Payer: Self-pay | Admitting: Cardiovascular Disease

## 2013-09-03 LAB — PROTIME-INR

## 2013-09-05 ENCOUNTER — Encounter: Payer: Self-pay | Admitting: Cardiovascular Disease

## 2013-09-08 ENCOUNTER — Ambulatory Visit (HOSPITAL_COMMUNITY)
Admission: RE | Admit: 2013-09-08 | Discharge: 2013-09-08 | Disposition: A | Discharge status: Home / Self Care | Payer: Medicare Other | Source: Ambulatory Visit | Attending: Cardiovascular Disease | Admitting: Cardiovascular Disease

## 2013-09-08 ENCOUNTER — Telehealth: Payer: Self-pay | Admitting: Vascular Surgery

## 2013-09-08 ENCOUNTER — Encounter (HOSPITAL_COMMUNITY)
Admission: RE | Disposition: A | Discharge status: Home / Self Care | Payer: Self-pay | Source: Ambulatory Visit | Attending: Cardiovascular Disease

## 2013-09-08 ENCOUNTER — Other Ambulatory Visit: Payer: Self-pay | Admitting: *Deleted

## 2013-09-08 DIAGNOSIS — Z01810 Encounter for preprocedural cardiovascular examination: Secondary | ICD-10-CM

## 2013-09-08 DIAGNOSIS — E119 Type 2 diabetes mellitus without complications: Secondary | ICD-10-CM | POA: Insufficient documentation

## 2013-09-08 DIAGNOSIS — Z6831 Body mass index (BMI) 31.0-31.9, adult: Secondary | ICD-10-CM | POA: Insufficient documentation

## 2013-09-08 DIAGNOSIS — R0989 Other specified symptoms and signs involving the circulatory and respiratory systems: Secondary | ICD-10-CM | POA: Insufficient documentation

## 2013-09-08 DIAGNOSIS — I739 Peripheral vascular disease, unspecified: Secondary | ICD-10-CM

## 2013-09-08 DIAGNOSIS — Z794 Long term (current) use of insulin: Secondary | ICD-10-CM | POA: Insufficient documentation

## 2013-09-08 DIAGNOSIS — Z8249 Family history of ischemic heart disease and other diseases of the circulatory system: Secondary | ICD-10-CM | POA: Insufficient documentation

## 2013-09-08 DIAGNOSIS — D689 Coagulation defect, unspecified: Secondary | ICD-10-CM

## 2013-09-08 DIAGNOSIS — Z7902 Long term (current) use of antithrombotics/antiplatelets: Secondary | ICD-10-CM | POA: Insufficient documentation

## 2013-09-08 DIAGNOSIS — Z79899 Other long term (current) drug therapy: Secondary | ICD-10-CM

## 2013-09-08 DIAGNOSIS — E663 Overweight: Secondary | ICD-10-CM | POA: Insufficient documentation

## 2013-09-08 DIAGNOSIS — L97409 Non-pressure chronic ulcer of unspecified heel and midfoot with unspecified severity: Secondary | ICD-10-CM | POA: Insufficient documentation

## 2013-09-08 DIAGNOSIS — Z951 Presence of aortocoronary bypass graft: Secondary | ICD-10-CM | POA: Insufficient documentation

## 2013-09-08 DIAGNOSIS — I10 Essential (primary) hypertension: Secondary | ICD-10-CM | POA: Insufficient documentation

## 2013-09-08 DIAGNOSIS — Z87891 Personal history of nicotine dependence: Secondary | ICD-10-CM | POA: Insufficient documentation

## 2013-09-08 DIAGNOSIS — I70219 Atherosclerosis of native arteries of extremities with intermittent claudication, unspecified extremity: Secondary | ICD-10-CM

## 2013-09-08 DIAGNOSIS — L98499 Non-pressure chronic ulcer of skin of other sites with unspecified severity: Secondary | ICD-10-CM

## 2013-09-08 DIAGNOSIS — E785 Hyperlipidemia, unspecified: Secondary | ICD-10-CM | POA: Insufficient documentation

## 2013-09-08 DIAGNOSIS — Z7982 Long term (current) use of aspirin: Secondary | ICD-10-CM | POA: Insufficient documentation

## 2013-09-08 HISTORY — PX: LOWER EXTREMITY ANGIOGRAM: SHX5508

## 2013-09-08 LAB — PROTIME-INR
INR: 1.13 (ref 0.00–1.49)
PROTHROMBIN TIME: 14.5 s (ref 11.6–15.2)

## 2013-09-08 LAB — CBC
HCT: 34 % — ABNORMAL LOW (ref 36.0–46.0)
Hemoglobin: 10.9 g/dL — ABNORMAL LOW (ref 12.0–15.0)
MCH: 27.3 pg (ref 26.0–34.0)
MCHC: 32.1 g/dL (ref 30.0–36.0)
MCV: 85 fL (ref 78.0–100.0)
PLATELETS: 155 10*3/uL (ref 150–400)
RBC: 4 MIL/uL (ref 3.87–5.11)
RDW: 15.1 % (ref 11.5–15.5)
WBC: 7 10*3/uL (ref 4.0–10.5)

## 2013-09-08 LAB — GLUCOSE, CAPILLARY
Glucose-Capillary: 209 mg/dL — ABNORMAL HIGH (ref 70–99)
Glucose-Capillary: 204 mg/dL — ABNORMAL HIGH (ref 70–99)

## 2013-09-08 SURGERY — ANGIOGRAM, LOWER EXTREMITY
Anesthesia: LOCAL | Laterality: Bilateral

## 2013-09-08 MED ORDER — HYDROCODONE-ACETAMINOPHEN 5-325 MG PO TABS
ORAL_TABLET | ORAL | Status: AC
  Filled 2013-09-08: qty 2

## 2013-09-08 MED ORDER — LIDOCAINE HCL (PF) 1 % IJ SOLN
INTRAMUSCULAR | Status: AC
Start: 2013-09-08 — End: 2013-09-08
  Filled 2013-09-08: qty 30

## 2013-09-08 MED ORDER — ONDANSETRON HCL 4 MG/2ML IJ SOLN: 4.0000 mg | Freq: Four times a day (QID) | INTRAMUSCULAR | Status: DC | PRN

## 2013-09-08 MED ORDER — SODIUM CHLORIDE 0.9 % IV SOLN
INTRAVENOUS | Status: DC
  Administered 2013-09-08: 08:00:00 via INTRAVENOUS

## 2013-09-08 MED ORDER — ONDANSETRON HCL 4 MG/2ML IJ SOLN
INTRAMUSCULAR | Status: AC
  Filled 2013-09-08: qty 2

## 2013-09-08 MED ORDER — HEPARIN (PORCINE) IN NACL 2-0.9 UNIT/ML-% IJ SOLN
INTRAMUSCULAR | Status: AC
  Filled 2013-09-08: qty 1000

## 2013-09-08 MED ORDER — MIDAZOLAM HCL 2 MG/2ML IJ SOLN
INTRAMUSCULAR | Status: AC
  Filled 2013-09-08: qty 2

## 2013-09-08 MED ORDER — SODIUM CHLORIDE 0.9 % IJ SOLN: 3.0000 mL | INTRAMUSCULAR | Status: DC | PRN

## 2013-09-08 MED ORDER — HYDRALAZINE HCL 20 MG/ML IJ SOLN
10.0000 mg | INTRAMUSCULAR | Status: DC | PRN
  Administered 2013-09-08: 10 mg via INTRAVENOUS

## 2013-09-08 MED ORDER — ACETAMINOPHEN 325 MG PO TABS: 650.0000 mg | ORAL_TABLET | ORAL | Status: DC | PRN

## 2013-09-08 MED ORDER — HYDRALAZINE HCL 20 MG/ML IJ SOLN
INTRAMUSCULAR | Status: AC
  Filled 2013-09-08: qty 1

## 2013-09-08 MED ORDER — FENTANYL CITRATE 0.05 MG/ML IJ SOLN
INTRAMUSCULAR | Status: AC
  Filled 2013-09-08: qty 2

## 2013-09-08 MED ORDER — ASPIRIN 81 MG PO CHEW
81.0000 mg | CHEWABLE_TABLET | ORAL | Status: AC
  Administered 2013-09-08: 81 mg via ORAL

## 2013-09-08 MED ORDER — SODIUM CHLORIDE 0.9 % IV SOLN: INTRAVENOUS | Status: AC

## 2013-09-08 MED ORDER — ASPIRIN 81 MG PO CHEW
CHEWABLE_TABLET | ORAL | Status: AC
  Filled 2013-09-08: qty 1

## 2013-09-08 MED ORDER — HYDROCODONE-ACETAMINOPHEN 5-325 MG PO TABS
2.0000 | ORAL_TABLET | Freq: Once | ORAL | Status: AC
  Administered 2013-09-08: 2 via ORAL

## 2013-09-08 NOTE — Progress Notes (Signed)
Rt femoral arterial sheath removed by Tamala Julian

## 2013-09-08 NOTE — CV Procedure (Signed)
[name] [name] is a 68 y.o. female    [id number] LOCATION:  FACILITY: [facility]  PHYSICIAN: Quay Burow, M.D. 27-Jun-1945   DATE OF PROCEDURE:  09/08/2013  DATE OF DISCHARGE:     PV Angiogram/Intervention    History obtained from chart review.[name] [name] is a 68 year old moderately overweight Caucasian female mother of 4 children referred by Dr. Ladona Horns for critical limb ischemia. Her primary care physician is Dr. Woody Seller.Her cardiovascular risk factor profile is remarkable for 100-150-pack-years of tobacco abuse having quit 3 years ago prior to her bypass graft surgery. She has treated hypertension, diabetes and hyperlipidemia. Her father died of a myocardial infarction.  She has had coronary artery bypass grafting x4 in 2004 by Dr. Tharon Aquas Trigt She's also had bilateral carotid endarterectomies. She has a nonhealing ulcer on the plantar surface of her left foot for 6 months. Recent Dopplers performed in her office recently revealed an occluded left SFA. She presents now for angiography and potential intervention for critical limb ischemia.    PROCEDURE DESCRIPTION:   The patient was brought to the second floor [place] [facility] cath lab in the postabsorptive state. She was premedicated with Valium 5 mg by mouth, IV Versed and fentanyl. Her right groinwas prepped and shaved in usual sterile fashion. Xylocaine 1% was used for local anesthesia. A 5 French sheath was inserted into the right common femoral artery using standard Seldinger technique. A 5 French pigtail catheter was placed in the distal, Dora. Distal abdominal aortography, bilateral iliac angiography with bifemoral runoff was performed using bolus chase digital subtraction step table technique. Omnipaque dye was used for the entirety of the case. Retrograde aortic pressure was monitored during the case. Contralateral access was obtained with a 5 [place] crossover catheter and epidural catheter to obtain an oblique view of the profunda SFA  bifurcation.   HEMODYNAMICS:    AO SYSTOLIC/AO DIASTOLIC: 409/81   Angiographic Data:   1: Abdominal aortogram-the distal abdominal aorta  was free of significant disease  2: Left lower extremity-left SFA was occluded just beyond the origin of the profunda takeoff and reconstituted in the adductor canal. There was moderate popliteal disease with two-vessel runoff. The anterior tibial is occluded, the posterior tibial was diseased and the peroneal was the dominant vessel.  3: Right lower extremity-the right SFA had a 90% fairly focal stenosis the midportion followed by a 50% segmental stenosis just beyond that. 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 I believe this would be a high-risk percutaneous procedure given the proximity of the occlusion to the profunda takeoff. I have discussed her case with an angiogram with Dr. Scot Dock of [facility] and I suggested that she have femoropopliteal bypass grafting for her critical limb ischemia. The issue will be appropriate venous conduits given the fact that she had prior coronary artery bypass grafting. She will be discharged to home today as an outpatient and will followup with Dr. Doren Custard for preoperative evaluation   [name]. MD, [facility] 09/08/2013 10:22 AM

## 2013-09-08 NOTE — H&P (View-Only) (Signed)
08/18/2013 [name] [name]   09/07/1945  [id number]  Primary Physician Glenda Chroman., MD Primary Cardiologist: [name] MD [name]   HPI:  [name] [name] is a 68 year old moderately overweight Caucasian female mother of 4 children, grandmother of the grandchildren referred by Dr. Ladona Horns for critical limb ischemia. Her primary care physician is Dr. Allie Dimmer cardiovascular risk factor profile is remarkable for 100-150-pack-years of tobacco abuse having quit 3 years ago prior to her bypass graft surgery. She has treated hypertension, diabetes and hyperlipidemia. Her father died of a myocardial infarction. She has had a gait MRI as well as a stroke. She a priori bypass grafting x4 in 2004 by Dr. Tharon Aquas Trigt  She's also had bilateral carotid endarterectomies. She has had a nonhealing ulcer on the plantar surface of her left foot for 6 months. Recent Dopplers are positive vertigo performed at [facility] revealed a right ABI of 0.76 and a left ABI of 0.48.   Current Outpatient Prescriptions  Medication Sig Dispense Refill  . amLODipine (NORVASC) 10 MG tablet Take 1 tablet (10 mg total) by mouth daily.  180 tablet  3  . aspirin EC 81 MG EC tablet Take 1 tablet (81 mg total) by mouth daily.      [name] atorvastatin (LIPITOR) 40 MG tablet Take 1 tablet (40 mg total) by mouth daily at 6 PM.  30 tablet  5  . carvedilol (COREG) 12.5 MG tablet Take 1.5 tablets (18.75 mg total) by mouth 2 (two) times daily.  270 tablet  3  . citalopram (CELEXA) 20 MG tablet Take 20 mg by mouth daily.      . clopidogrel (PLAVIX) 75 MG tablet Take 1 tablet (75 mg total) by mouth daily with breakfast.  30 tablet  6  . furosemide (LASIX) 40 MG tablet Take 1.5 tablets (60 mg total) by mouth every morning.  135 tablet  3  . glimepiride (AMARYL) 2 MG tablet Take 2 mg by mouth daily with breakfast.      . HYDROcodone-acetaminophen (NORCO/VICODIN) 5-325 MG per tablet Take 1 tablet by mouth as needed.      .  insulin aspart (NOVOLOG) 100 UNIT/ML injection Inject 8 Units into the skin 3 (three) times daily with meals.      . insulin detemir (LEVEMIR) 100 UNIT/ML injection Inject 36 Units into the skin at bedtime.       . isosorbide mononitrate (IMDUR) 30 MG 24 hr tablet Take 30 mg by mouth daily.      [name] lisinopril (PRINIVIL,ZESTRIL) 20 MG tablet Take 1 tablet (20 mg total) by mouth 2 (two) times daily.  180 tablet  3  . Multiple Vitamin (MULTIVITAMIN WITH MINERALS) TABS tablet Take 1 tablet by mouth daily.      . nitroGLYCERIN (NITROSTAT) 0.4 MG SL tablet Place 1 tablet (0.4 mg total) under the tongue every 5 (five) minutes as needed for chest pain. Up to 3 doses. If no relief after 3rd dose, proceed to the ED for an evaluation  25 tablet  3  . potassium chloride (K-DUR) 10 MEQ tablet Take 1 tablet (10 mEq total) by mouth daily.  90 tablet  3   No current facility-administered medications for this visit.    Allergies  Allergen Reactions  . Other Shortness Of Breath and Rash    All berries   . Strawberry Hives, Shortness Of Breath and Rash  . Sulfa Antibiotics Swelling    History   Social History  .  Marital Status: Widowed    Spouse Name: N/A    Number of Children: N/A  . Years of Education: N/A   Occupational History  . Not on file.   Social History Main Topics  . Smoking status: Former Smoker -- 1.00 packs/day for 50 years    Types: Cigarettes    Quit date: 10/09/2009  . Smokeless tobacco: Never Used     Comment: SMOKED FOR 50 YEARS - up to 2 packs/day for much of that time.  . Alcohol Use: Yes     Comment: Very rare.  . Drug Use: No  . Sexual Activity: Not on file   Other Topics Concern  . Not on file   Social History Narrative   Lives in [place] by herself.  She does not work.     Review of Systems: General: negative for chills, fever, night sweats or weight changes.  Cardiovascular: negative for chest pain, dyspnea on exertion, edema, orthopnea, palpitations, paroxysmal  nocturnal dyspnea or shortness of breath Dermatological: negative for rash Respiratory: negative for cough or wheezing Urologic: negative for hematuria Abdominal: negative for nausea, vomiting, diarrhea, bright red blood per rectum, melena, or hematemesis Neurologic: negative for visual changes, syncope, or dizziness All other systems reviewed and are otherwise negative except as noted above.    Blood pressure 176/70, pulse 54, height _0  (1.676 m), weight 211 lb 12.8 oz (96.072 kg).  General appearance: alert and no distress Neck: no adenopathy, no JVD, supple, symmetrical, trachea midline, thyroid not enlarged, symmetric, no tenderness/mass/nodules and soft bilateral carotid bruits Lungs: clear to auscultation bilaterally Heart: regular rate and rhythm, S1, S2 normal, no murmur, click, rub or gallop Extremities: tense edema in her lower extremities right greater than left with venous stasis changes. There are no palpable pedal pulses. She has her on a plantar surface of her left foot.  EKG not performed today  ASSESSMENT AND PLAN:   PAD (peripheral artery disease) Patient has a 5-6 month history of a ischemic ulcer on the plantar surface of her left foot under her fourth metatarsal head. She had Doppler studies performed at [facility] 06/13/13 which revealed a left ABI of 0.40 right of 0.76. She did complain of a history of claudication left greater than right. I would repeat her lower extremity arterial Doppler studies and arrange for her to undergo angiography and potential intervention for limb salvage.  Carotid artery disease History of bilateral carotid endarterectomies and possibly 4-5 years ago. She has bilateral carotid bruits. I'm going to repeat her carotid Doppler studies.      [name] MD FACP,FACC,FAHA, [facility] 08/18/2013 11:27 AM

## 2013-09-08 NOTE — Progress Notes (Signed)
Right femoral arterial sheath pulled by Tamala Julian, RRT. Manual pressure held for 54mnutes. Dopplered right PT pulse was present. Post instructions given. No complications noted.

## 2013-09-08 NOTE — Discharge Instructions (Signed)
Angiogram, Care After Refer to this sheet in the next few weeks. These instructions provide you with information on caring for yourself after your procedure. Your health care provider may also give you more specific instructions. Your treatment has been planned according to current medical practices, but problems sometimes occur. Call your health care provider if you have any problems or questions after your procedure.  WHAT TO EXPECT AFTER THE PROCEDURE After your procedure, it is typical to have the following sensations:  Minor discomfort or tenderness and a small bump at the catheter insertion site. The bump should usually decrease in size and tenderness within 1 to 2 weeks.  Any bruising will usually fade within 2 to 4 weeks. HOME CARE INSTRUCTIONS   You may need to keep taking blood thinners if they were prescribed for you. Take medicines only as directed by your health care provider.  Do not apply powder or lotion to the site.  Do not take baths, swim, or use a hot tub until your health care provider approves.  You may shower 24 hours after the procedure. Remove the bandage (dressing) and gently wash the site with plain soap and water. Gently pat the site dry.  Inspect the site at least twice daily.  Limit your activity for the first 48 hours. Do not bend, squat, or lift anything over 20 lb (9 kg) or as directed by your health care provider.  Plan to have someone take you home after the procedure. Follow instructions about when you can drive or return to work. SEEK MEDICAL CARE IF:  You get light-headed when standing up.  You have drainage (other than a small amount of blood on the dressing).  You have chills.  You have a fever.  You have redness, warmth, swelling, or pain at the insertion site. SEEK IMMEDIATE MEDICAL CARE IF:   You develop chest pain or shortness of breath, feel faint, or pass out.  You have bleeding, swelling larger than a walnut, or drainage from the  catheter insertion site.  You develop pain, discoloration, coldness, or severe bruising in the leg or arm that held the catheter.  You develop bleeding from any other place, such as the bowels. You may see bright red blood in your urine or stools, or your stools may appear black and tarry.  You have heavy bleeding from the site. If this happens, hold pressure on the site. MAKE SURE YOU:  Understand these instructions.  Will watch your condition.  Will get help right away if you are not doing well or get worse. Document Released: 07/14/2004 Document Revised: 05/12/2013 Document Reviewed: 05/20/2012 [facility] Patient Information 2015 [place], [place]. This information is not intended to replace advice given to you by your health care provider. Make sure you discuss any questions you have with your health care provider.

## 2013-09-08 NOTE — Telephone Encounter (Signed)
Message copied by Gena Fray on Mon Sep 08, 2013  2:36 PM ------      Message from: Stig[place], [place] K      Created: Mon Sep 08, 2013  2:06 PM      Regarding: Schedule                   ----- Message -----         From: Angelia Mould, MD         Sent: 09/08/2013  12:06 PM           To: Vvs Charge Pool      Subject: charge and office visit                                  Level 5 consult from [facility] Wednesday and vein map both LE's      Thanks      CD ------

## 2013-09-08 NOTE — Consult Note (Signed)
[facility]  Patient name: [name] [name] MRN: [id number] DOB: Jul 23, 1945 Sex: female  REASON FOR CONSULT: Nonhealing wound of the left foot. Consult from Dr. Quay Burow.  HPI: [name] [name] is a 68 y.o. female who developed a wound on her left foot approximately 4 months ago. She apparently had an incision and drainage on the plantar aspect of her foot and this has been slow to heal. She was evaluated by Dr. Quay Burow and presented for arteriography today. She had a long segment superficial femoral artery occlusion and was not a candidate for endovascular approach. Vascular surgery was counseled for evaluation for possible bypass.  The patient does admit to bilateral lower extremity claudication although her activity is fairly limited. She also describes some rest pain in her left foot.  Her risk factors for peripheral vascular disease include diabetes, hypertension, hypercholesterolemia, and a remote history of tobacco use. In addition she has a history of premature cardiovascular disease. Her father had coronary disease in his 18s.  She has undergone coronary revascularization and vein was taken from both legs. She has also undergone bilateral carotid endarterectomies in the past but does not remember who her surgeon was.   Past Medical History  Diagnosis Date  . Coronary atherosclerosis of native coronary artery     a. 11/2010 NSTEMI/CABG x 4: L-LAD; S-PL; S-OM; S-DX, by PVT.  [name] Chronic combined systolic and diastolic CHF (congestive heart failure)     a. 11/2010 TEE: EF 40-45%, no veg or LA thrombus.  . Mixed hyperlipidemia   . Essential hypertension, benign   . Depression   . Cellulitis     a. Recurrent, bilateral legs  . Arthritis   . Carotid artery disease     a. Bilateral CEA; 50-60% bilateral ICA stenosis, 11/12  . PAD (peripheral artery disease)   . Type 2 diabetes mellitus   . Suicide attempt 08/2002  . TIA (transient ischemic  attack)   . TMJ syndrome   . Stroke 08/2009  . Herniated disc   . Ischemic cardiomyopathy     a. 11/2010 TEE: EF 40-45%.  . Myocardial infarction   . Headache(784.0)   . Critical lower limb ischemia   . Venous insufficiency    Family History  Problem Relation Age of Onset  . Coronary artery disease Father     Died with MI age 67  . Diabetes type II Mother   . Hypertension Mother   . Heart failure Sister   . Cancer Brother     Lung cancer   SOCIAL HISTORY: History  Substance Use Topics  . Smoking status: Former Smoker -- 1.00 packs/day for 50 years    Types: Cigarettes    Quit date: 10/09/2009  . Smokeless tobacco: Never Used     Comment: SMOKED FOR 50 YEARS - up to 2 packs/day for much of that time.  . Alcohol Use: Yes     Comment: Very rare.   Allergies  Allergen Reactions  . Other Shortness Of Breath and Rash    All berries   . Strawberry Hives, Shortness Of Breath and Rash  . Sulfa Antibiotics Swelling   Current Facility-Administered Medications  Medication Dose Route Frequency Provider Last Rate Last Dose  . 0.9 %  sodium chloride infusion   Intravenous Continuous [name], MD 75 mL/hr at 09/08/13 0825    . 0.9 %  sodium chloride infusion   Intravenous Continuous [name], MD 75 mL/hr at  09/08/13 1100    . acetaminophen (TYLENOL) tablet 650 mg  650 mg Oral Q4H PRN [name], MD      . aspirin 81 MG chewable tablet           . hydrALAZINE (APRESOLINE) injection 10 mg  10 mg Intravenous Q4H PRN [name], MD   10 mg at 09/08/13 1100  . HYDROcodone-acetaminophen (NORCO/VICODIN) 5-325 MG per tablet 2 tablet  2 tablet Oral Once Tarri Fuller, PA-C      . HYDROcodone-acetaminophen (NORCO/VICODIN) 5-325 MG per tablet           . ondansetron (ZOFRAN) injection 4 mg  4 mg Intravenous Q6H PRN [name], MD      . sodium chloride 0.9 % injection 3 mL  3 mL Intravenous PRN [name], MD       REVIEW OF SYSTEMS: Valu.Nieves ] denotes positive  finding; [  ] denotes negative finding CARDIOVASCULAR:  _0  chest pain   _1  chest pressure   _2  palpitations   _3  orthopnea   _4  dyspnea on exertion   _5  claudication   _6  rest pain   _7  DVT   _8  phlebitis PULMONARY:   _9  productive cough   _10  asthma   _11  wheezing NEUROLOGIC:   _12  weakness  _13  paresthesias  _14  aphasia  _15  amaurosis  _16  dizziness HEMATOLOGIC:   _17  bleeding problems   _18  clotting disorders MUSCULOSKELETAL:  Valu.Nieves ] joint pain   _19  joint swelling _20  leg swelling GASTROINTESTINAL: _21   blood in stool  _22   hematemesis GENITOURINARY:  _23   dysuria  _24   hematuria PSYCHIATRIC:  _25  history of major depression INTEGUMENTARY:  _26  rashes  Valu.Nieves ] ulcers CONSTITUTIONAL:  _27  fever   _28  chills  PHYSICAL EXAM: Filed Vitals:   09/08/13 1045 09/08/13 1050 09/08/13 1055 09/08/13 1100  BP: 195/53 186/55 173/52 182/56  Pulse: 60 58 56   Temp:      TempSrc:      Resp: _29 Height:      Weight:      SpO2: 90% 91% 94%    Body mass index is 31.17 kg/(m^2). GENERAL: The patient is a well-nourished female, in no acute distress. The vital signs are documented above. CARDIOVASCULAR: There is a regular rate and rhythm. She has bilateral carotid bruits. She has palpable femoral pulses. I cannot palpate popliteal or pedal pulses. She has mild bilateral lower extremity swelling. PULMONARY: There is good air exchange bilaterally without wheezing or rales. ABDOMEN: Soft and non-tender with normal pitched bowel sounds.  MUSCULOSKELETAL: There are no major deformities or cyanosis. NEUROLOGIC: No focal weakness or paresthesias are detected. SKIN: There are no ulcers or rashes noted. She has some hyperpigmentation bilaterally consistent with chronic venous insufficiency. There is a wound on the plantar aspect of her left foot with no significant drainage or erythema currently. PSYCHIATRIC: The patient has a normal affect.  DATA:  Lab Results  Component Value Date   WBC 7.0  09/08/2013   HGB 10.9* 09/08/2013   HCT 34.0* 09/08/2013   MCV 85.0 09/08/2013   PLT 155 09/08/2013   Lab Results  Component Value Date   NA 137 03/28/2013   K 4.0 03/28/2013   CL 95* 03/28/2013   CO2 31 03/28/2013   Lab Results  Component Value Date   CREATININE 0.93 03/28/2013   Lab Results  Component Value Date   INR 1.13 09/08/2013   INR 1.02 03/27/2013   INR 1.44 11/24/2010   Lab Results  Component Value Date   HGBA1C 10.8* 03/18/2013   CBG (last 3)   Recent Labs  09/08/13 0736 09/08/13 1027  GLUCAP 209* 204*   ARTERIOGRAM: I have reviewed her arteriogram which was performed by Dr. Donnella Bi today. She had no significant inflow disease on the left. The common femoral and deep femoral artery are patent. The superficial femoral artery is occluded at its origin with reconstitution of the pop 2 artery above the knee. There is moderate diffuse disease the popliteal artery. The distal popliteal artery is patent and there is two-vessel runoff on the left via the posterior tibial and peroneal arteries.  CAROTID DUPLEX: The patient had a carotid duplex scan on 08/28/2013 which shows bilateral 50-69% carotid stenoses.   ABIS: ABIs this month showed a 73% ABI on the right and 62% ABI on the left.   MEDICAL ISSUES:   NONHEALING WOUND OF THE LEFT FOOT WITH INFRAINGUINAL ARTERIAL OCCLUSIVE DISEASE: Given a nonhealing wound in the left foot with significant infrainguinal arterial occlusive disease the patient has a limb threatening ischemia. She was not a candidate for endovascular approach and therefore I would recommend bypass on the left. She appears to be a candidate for a left femoral to distal popliteal artery bypass. She has had vein taken from both legs and if at all possible I would want to do an autogenous bypass. I will arrange for her to come to the office for vein mapping to determine if she has adequate vein for a bypass. We will have to stop her Plavix for 5 days prior to  surgery also. If she does not have adequate vein then she would require a femoral to below knee pop bypass with PTFE which would be associated with significant poor long-term patency and also risk of infection.   [place] [facility] [place] [facility]: [phone]

## 2013-09-08 NOTE — Interval H&P Note (Signed)
History and Physical Interval Note:  09/08/2013 9:34 AM  [name] [name]  has presented today for surgery, with the diagnosis of Claudication  The various methods of treatment have been discussed with the patient and family. After consideration of risks, benefits and other options for treatment, the patient has consented to  Procedure(s): LOWER EXTREMITY ANGIOGRAM (N/A) as a surgical intervention .  The patient's history has been reviewed, patient examined, no change in status, stable for surgery.  I have reviewed the patient's chart and labs.  Questions were answered to the patient's satisfaction.     [name]

## 2013-09-08 NOTE — Telephone Encounter (Signed)
LM for pt with appt info, dpm

## 2013-09-08 NOTE — Progress Notes (Signed)
Dressing intact to lt foot/ankle

## 2013-09-09 ENCOUNTER — Encounter: Payer: Self-pay | Admitting: Vascular Surgery

## 2013-09-10 ENCOUNTER — Encounter: Payer: Self-pay | Admitting: Vascular Surgery

## 2013-09-10 ENCOUNTER — Ambulatory Visit (HOSPITAL_COMMUNITY)
Admit: 2013-09-10 | Discharge: 2013-09-10 | Disposition: A | Discharge status: Home / Self Care | Payer: Medicare Other | Source: Ambulatory Visit | Attending: Vascular Surgery | Admitting: Vascular Surgery

## 2013-09-10 ENCOUNTER — Ambulatory Visit (INDEPENDENT_AMBULATORY_CARE_PROVIDER_SITE_OTHER): Payer: Medicare Other | Admitting: Vascular Surgery

## 2013-09-10 VITALS — BP 189/73 | HR 59 | Resp 18 | Ht 67.0 in | Wt 204.6 lb

## 2013-09-10 DIAGNOSIS — Z01810 Encounter for preprocedural cardiovascular examination: Secondary | ICD-10-CM | POA: Diagnosis present

## 2013-09-10 DIAGNOSIS — I2510 Atherosclerotic heart disease of native coronary artery without angina pectoris: Secondary | ICD-10-CM | POA: Diagnosis not present

## 2013-09-10 DIAGNOSIS — Z951 Presence of aortocoronary bypass graft: Secondary | ICD-10-CM | POA: Diagnosis not present

## 2013-09-10 DIAGNOSIS — I739 Peripheral vascular disease, unspecified: Secondary | ICD-10-CM

## 2013-09-10 DIAGNOSIS — L98499 Non-pressure chronic ulcer of skin of other sites with unspecified severity: Secondary | ICD-10-CM

## 2013-09-10 NOTE — Assessment & Plan Note (Signed)
Based on her vein mapping today, she does not have adequate vein for an autogenous bypass. Therefore, she would require a femoral to below knee popliteal artery bypass with PTFE. However, currently the wound on the foot appears to be healing adequately as there is no erythema or drainage. For this reason, I have ordered a follow up x-ray of the foot in one month to be sure there is no evidence of osteomyelitis and I will see her back at that time. If the wound on the plantar aspect of the foot does not heal then certainly I would recommend proceeding with bypass. This would be associated with some risk given her obesity, diabetes, coronary artery disease, and CHF. In addition, given that we would use a prosthetic graft it would be risk of graft infection and an increased risk of graft thrombosis. Fortunately she is not a smoker and I think there is good chance of this wound healing.

## 2013-09-10 NOTE — Progress Notes (Signed)
Vascular and Vein Specialist of [facility]  Patient name: [name] [name] MRN: [id number] DOB: 14-Sep-1945 Sex: female  REASON FOR VISIT: Follow up of left foot wound.  HPI: [name] [name] is a 68 y.o. female who I saw in consultation in the hospital earlier this week. She developed a wound on her fourth toe on the left foot approximately 4 months ago. Ultimately she she developed an infection on the plantar aspect of her foot and underwent I&D in [place]. She states that an x-ray was done which did not show evidence of osteomyelitis. Given the wound on the left foot, she was evaluated by Dr. Quay Burow and had evidence of infrainguinal arterial occlusive disease. She underwent arteriography which showed occlusion of the superficial femoral artery at its origin with reconstitution of a diseased popliteal artery and two-vessel runoff via the peroneal and posterior tibial arteries. She was not felt to be a good candidate for an endovascular approach to her long segment SFA occlusion and vascular surgery was consulted. She had greater saphenous vein taken from both lower extremities for previous coronary revascularization. I brought her in today for vein mapping of both lower extremities.  She does describe some bilateral lower extremity claudication although her activity is fairly limited. She wears oxygen as needed and is currently on oxygen in the office. In addition, she has diabetes, obesity, coronary artery disease, and congestive heart failure.  She denies significant pain in the left foot. She denies fever or chills.  Past Medical History  Diagnosis Date  . Coronary atherosclerosis of native coronary artery     a. 11/2010 NSTEMI/CABG x 4: L-LAD; S-PL; S-OM; S-DX, by PVT.  [name] Chronic combined systolic and diastolic CHF (congestive heart failure)     a. 11/2010 TEE: EF 40-45%, no veg or LA thrombus.  . Mixed hyperlipidemia   . Essential hypertension, benign   . Depression   . Cellulitis    a. Recurrent, bilateral legs  . Arthritis   . Carotid artery disease     a. Bilateral CEA; 50-60% bilateral ICA stenosis, 11/12  . PAD (peripheral artery disease)   . Type 2 diabetes mellitus   . Suicide attempt 08/2002  . TIA (transient ischemic attack)   . TMJ syndrome   . Stroke 08/2009  . Herniated disc   . Ischemic cardiomyopathy     a. 11/2010 TEE: EF 40-45%.  . Myocardial infarction   . Headache(784.0)   . Critical lower limb ischemia   . Venous insufficiency    Family History  Problem Relation Age of Onset  . Coronary artery disease Father     Died with MI age 45  . Diabetes type II Mother   . Hypertension Mother   . Heart failure Sister   . Cancer Brother     Lung cancer   SOCIAL HISTORY: History  Substance Use Topics  . Smoking status: Former Smoker -- 1.00 packs/day for 50 years    Types: Cigarettes    Quit date: 10/09/2009  . Smokeless tobacco: Never Used     Comment: SMOKED FOR 50 YEARS - up to 2 packs/day for much of that time.  . Alcohol Use: Yes     Comment: Very rare.   Allergies  Allergen Reactions  . Other Shortness Of Breath and Rash    All berries   . Strawberry Hives, Shortness Of Breath and Rash  . Sulfa Antibiotics Swelling   Current Outpatient Prescriptions  Medication Sig Dispense Refill  . amLODipine (  NORVASC) 10 MG tablet Take 10 mg by mouth daily.      [name] aspirin 81 MG EC tablet Take 81 mg by mouth daily.      [name] atorvastatin (LIPITOR) 40 MG tablet Take 40 mg by mouth daily at 6 PM.      . carvedilol (COREG) 12.5 MG tablet Take 18.75 mg by mouth 2 (two) times daily.      . citalopram (CELEXA) 20 MG tablet Take 20 mg by mouth daily.      . clopidogrel (PLAVIX) 75 MG tablet Take 75 mg by mouth daily with breakfast.      . furosemide (LASIX) 40 MG tablet Take 60 mg by mouth every morning.      [name] glimepiride (AMARYL) 2 MG tablet Take 2 mg by mouth 2 (two) times daily.       [name] HYDROcodone-acetaminophen (NORCO/VICODIN) 5-325 MG per tablet  Take 1 tablet by mouth daily as needed.       . insulin aspart (NOVOLOG) 100 UNIT/ML injection Inject 8 Units into the skin 3 (three) times daily with meals.      . insulin detemir (LEVEMIR) 100 UNIT/ML injection Inject 36 Units into the skin at bedtime.       . isosorbide mononitrate (IMDUR) 30 MG 24 hr tablet Take 30 mg by mouth daily.      [name] lisinopril (PRINIVIL,ZESTRIL) 20 MG tablet Take 20 mg by mouth daily.      . Multiple Vitamin (MULTIVITAMIN WITH MINERALS) TABS tablet Take 1 tablet by mouth daily.      . nitroGLYCERIN (NITROSTAT) 0.4 MG SL tablet Place 1 tablet (0.4 mg total) under the tongue every 5 (five) minutes as needed for chest pain. Up to 3 doses. If no relief after 3rd dose, proceed to the ED for an evaluation  25 tablet  3  . potassium chloride (K-DUR) 10 MEQ tablet Take 10 mEq by mouth daily.      [name] PRESCRIPTION MEDICATION 2 liters of Oxygen       No current facility-administered medications for this visit.   REVIEW OF SYSTEMS: Valu.Nieves ] denotes positive finding; [  ] denotes negative finding  CARDIOVASCULAR:  _0  chest pain   _1  chest pressure   _2  palpitations   _3  orthopnea   _4  dyspnea on exertion   _5  claudication   _6  rest pain   _7  DVT   _8  phlebitis PULMONARY:   _9  productive cough   _10  asthma   _11  wheezing NEUROLOGIC:   _12  weakness  _13  paresthesias  _14  aphasia  _15  amaurosis  _16  dizziness HEMATOLOGIC:   _17  bleeding problems   _18  clotting disorders MUSCULOSKELETAL:  Valu.Nieves ] joint pain   _19  joint swelling _20  leg swelling GASTROINTESTINAL: _21   blood in stool  _22   hematemesis GENITOURINARY:  _23   dysuria  _24   hematuria PSYCHIATRIC:  _25  history of major depression INTEGUMENTARY:  _26  rashes  Valu.Nieves ] ulcers CONSTITUTIONAL:  _27  fever   _28  chills  PHYSICAL EXAM: Filed Vitals:   09/10/13 1535  BP: 189/73  Pulse: 59  Resp: 18  Height: _29  (1.702 m)  Weight: 204 lb 9.6 oz (92.806 kg)   Body mass index is 32.04 kg/(m^2). GENERAL: The patient is a  well-nourished female, in no acute distress. The vital signs are documented above. CARDIOVASCULAR:  There is a regular rate and rhythm. She has bilateral carotid bruits. She has palpable femoral pulses. I cannot palpate popliteal or pedal pulses. PULMONARY: There is good air exchange bilaterally without wheezing or rales. ABDOMEN: Soft and non-tender with normal pitched bowel sounds.  MUSCULOSKELETAL: There are no major deformities or cyanosis. NEUROLOGIC: No focal weakness or paresthesias are detected. SKIN: the wound on the plantar aspect of her left foot appears to be healing adequately. There is no erythema or drainage. She has hyperpigmentation bilaterally consistent with chronic venous insufficiency. PSYCHIATRIC: The patient has a normal affect.  DATA:  LOWER EXTREMITY VEIN MAPPING: I have independently interpreted her vein mapping. There is a short segment of greater saphenous vein in the distal right leg which could potentially be adequate in size however it is only approximately 20 cm in length. The lesser saphenous vein bilaterally is not usable. There is no greater saphenous vein available in the left lower extremity.  MEDICAL ISSUES:  PAD (peripheral artery disease) Based on her vein mapping today, she does not have adequate vein for an autogenous bypass. Therefore, she would require a femoral to below knee popliteal artery bypass with PTFE. However, currently the wound on the foot appears to be healing adequately as there is no erythema or drainage. For this reason, I have ordered a follow up x-ray of the foot in one month to be sure there is no evidence of osteomyelitis and I will see her back at that time. If the wound on the plantar aspect of the foot does not heal then certainly I would recommend proceeding with bypass. This would be associated with some risk given her obesity, diabetes, coronary artery disease, and CHF. In addition, given that we would use a prosthetic graft it would  be risk of graft infection and an increased risk of graft thrombosis. Fortunately she is not a smoker and I think there is good chance of this wound healing.    Return in about 4 weeks (around 10/08/2013).  [facility] [place] [facility]: [phone]

## 2013-09-11 ENCOUNTER — Encounter: Payer: Self-pay | Admitting: Vascular Surgery

## 2013-10-02 ENCOUNTER — Encounter: Payer: Self-pay | Admitting: Cardiovascular Disease

## 2013-10-02 ENCOUNTER — Ambulatory Visit (INDEPENDENT_AMBULATORY_CARE_PROVIDER_SITE_OTHER): Payer: Medicare Other | Admitting: Cardiovascular Disease

## 2013-10-02 VITALS — BP 208/74 | HR 64 | Ht 66.0 in | Wt 207.0 lb

## 2013-10-02 DIAGNOSIS — D689 Coagulation defect, unspecified: Secondary | ICD-10-CM

## 2013-10-02 DIAGNOSIS — I739 Peripheral vascular disease, unspecified: Secondary | ICD-10-CM

## 2013-10-02 DIAGNOSIS — I2510 Atherosclerotic heart disease of native coronary artery without angina pectoris: Secondary | ICD-10-CM

## 2013-10-02 DIAGNOSIS — Z79899 Other long term (current) drug therapy: Secondary | ICD-10-CM

## 2013-10-02 NOTE — Patient Instructions (Signed)
Dr. Gwenlyn Found has ordered a peripheral angiogram to be done at [facility].  This procedure is going to look at the bloodflow in your lower extremities.  If Dr. Gwenlyn Found is able to open up the arteries, you will have to spend one night in the hospital.  If he is not able to open the arteries, you will be able to go home that same day.    After the procedure, you will not be allowed to drive for 3 days or push, pull, or lift anything greater than 10 lbs for one week.    You will be required to have the following tests prior to the procedure:  1. Blood work-the blood work can be done no more than 7 days prior to the procedure.  It can be done at any [facility] lab.  There is one downstairs on the first floor of this building and one in the [place] ([address])   *REPS Nicki Reaper and [employer]

## 2013-10-02 NOTE — Progress Notes (Signed)
10/02/2013 [name] [name]   Aug 06, 1945  [id number]  Primary Physician Glenda Chroman., MD Primary Cardiologist: [name] MD [name]    HPI:  [name] [name] is a 68 year old moderately overweight Caucasian female mother of 4 children, grandmother of the grandchildren referred by Dr. Ladona Horns for critical limb ischemia. Her primary care physician is Dr. Allie Dimmer cardiovascular risk factor profile is remarkable for 100-150-pack-years of tobacco abuse having quit 3 years ago prior to her bypass graft surgery. She has treated hypertension, diabetes and hyperlipidemia. Her father died of a myocardial infarction. She has had a gait MRI as well as a stroke. She a priori bypass grafting x4 in 2004 by Dr. Tharon Aquas Trigt She's also had bilateral carotid endarterectomies. She has had a nonhealing ulcer on the plantar surface of her left foot for 6 months. Lower extremity arterial Doppler studies performed in the office 08/27/13 revealed a left ABI of 0.62 with occluded left SFA. I intubated her on 09/08/13 revealing an occluded SFA the origin with reconstitution hunters canal two-vessel runoff the ulcer on the bottom of her foot has progressed and she has a new ulcer on the lateral aspect of her left calf. Based on this I decided to proceed with attempt at percutaneous vascularization. I will discuss this with Dr. Scot Dock..   Current Outpatient Prescriptions  Medication Sig Dispense Refill  . amLODipine (NORVASC) 10 MG tablet Take 10 mg by mouth daily.      [name] aspirin 81 MG EC tablet Take 81 mg by mouth daily.      [name] atorvastatin (LIPITOR) 40 MG tablet Take 40 mg by mouth daily at 6 PM.      . carvedilol (COREG) 12.5 MG tablet Take 18.75 mg by mouth 2 (two) times daily.      . citalopram (CELEXA) 20 MG tablet Take 20 mg by mouth daily.      . clopidogrel (PLAVIX) 75 MG tablet Take 75 mg by mouth daily with breakfast.      . furosemide (LASIX) 40 MG tablet Take 60 mg by mouth every morning.       [name] glimepiride (AMARYL) 2 MG tablet Take 2 mg by mouth 2 (two) times daily.       [name] HYDROcodone-acetaminophen (NORCO/VICODIN) 5-325 MG per tablet Take 1 tablet by mouth daily as needed.       . insulin aspart (NOVOLOG) 100 UNIT/ML injection Inject 8 Units into the skin 3 (three) times daily with meals.      . insulin detemir (LEVEMIR) 100 UNIT/ML injection Inject 36 Units into the skin at bedtime.       . isosorbide mononitrate (IMDUR) 30 MG 24 hr tablet Take 30 mg by mouth daily.      [name] lisinopril (PRINIVIL,ZESTRIL) 20 MG tablet Take 20 mg by mouth daily.      . Multiple Vitamin (MULTIVITAMIN WITH MINERALS) TABS tablet Take 1 tablet by mouth daily.      . nitroGLYCERIN (NITROSTAT) 0.4 MG SL tablet Place 1 tablet (0.4 mg total) under the tongue every 5 (five) minutes as needed for chest pain. Up to 3 doses. If no relief after 3rd dose, proceed to the ED for an evaluation  25 tablet  3  . potassium chloride (K-DUR) 10 MEQ tablet Take 10 mEq by mouth daily.      [name] PRESCRIPTION MEDICATION 2 liters of Oxygen       No current facility-administered medications for this visit.    Allergies  Allergen Reactions  . Other Shortness Of Breath and Rash    All berries   . Strawberry Hives, Shortness Of Breath and Rash  . Sulfa Antibiotics Swelling    History   Social History  . Marital Status: Widowed    Spouse Name: N/A    Number of Children: N/A  . Years of Education: N/A   Occupational History  . Not on file.   Social History Main Topics  . Smoking status: Former Smoker -- 1.00 packs/day for 50 years    Types: Cigarettes    Quit date: 10/09/2009  . Smokeless tobacco: Never Used     Comment: SMOKED FOR 50 YEARS - up to 2 packs/day for much of that time.  . Alcohol Use: Yes     Comment: Very rare.  . Drug Use: No  . Sexual Activity: Not on file   Other Topics Concern  . Not on file   Social History Narrative   Lives in [place] by herself.  She does not work.     Review of  Systems: General: negative for chills, fever, night sweats or weight changes.  Cardiovascular: negative for chest pain, dyspnea on exertion, edema, orthopnea, palpitations, paroxysmal nocturnal dyspnea or shortness of breath Dermatological: negative for rash Respiratory: negative for cough or wheezing Urologic: negative for hematuria Abdominal: negative for nausea, vomiting, diarrhea, bright red blood per rectum, melena, or hematemesis Neurologic: negative for visual changes, syncope, or dizziness All other systems reviewed and are otherwise negative except as noted above.    Blood pressure 208/74, pulse 64, height _0  (1.676 m), weight 207 lb (93.895 kg).  General appearance: alert and no distress Neck: no adenopathy, no carotid bruit, no JVD, supple, symmetrical, trachea midline and thyroid not enlarged, symmetric, no tenderness/mass/nodules Lungs: clear to auscultation bilaterally Heart: regular rate and rhythm, S1, S2 normal, no murmur, click, rub or gallop Extremities: extremities normal, atraumatic, no cyanosis or edema  EKG not performed today  ASSESSMENT AND PLAN:   PAD (peripheral artery disease) [name] has critical limb ischemia. I intraventricular H./31/15 revealing an occluded SFA the origin with reconstitution in Hunter's canal two-vessel runoff. I initially sent her to Dr. Scot Dock for consideration of surgical revascularization however vein mapping revealed no suitable venous conduit and she would need PTFE which I do not think his optimal I discussed the case with Dr. Doren Custard and we've agreed to attempt percutaneous catheterization and reserve surgical requisition for failure of the percutaneous approach.      [name] MD FACP,FACC,FAHA, [facility] 10/02/2013 4:45 PM

## 2013-10-02 NOTE — Assessment & Plan Note (Addendum)
[name] [name] has critical limb ischemia. I intraventricular H./31/15 revealing an occluded SFA the origin with reconstitution in Hunter's canal two-vessel runoff. I initially sent her to Dr. Scot Dock for consideration of surgical revascularization however vein mapping revealed no suitable venous conduit and she would need PTFE which I do not think his optimal I discussed the case with Dr. Doren Custard and we've agreed to attempt percutaneous catheterization and reserve surgical requisition for failure of the percutaneous approach.

## 2013-10-03 ENCOUNTER — Encounter (HOSPITAL_COMMUNITY): Payer: Self-pay | Admitting: Pharmacy Technician

## 2013-10-06 ENCOUNTER — Inpatient Hospital Stay (HOSPITAL_COMMUNITY)
Admission: RE | Admit: 2013-10-06 | Discharge: 2013-10-10 | DRG: 253 | Disposition: A | Discharge status: Home Health | Payer: Medicare Other | Source: Ambulatory Visit | Attending: Vascular Surgery | Admitting: Vascular Surgery

## 2013-10-06 ENCOUNTER — Encounter (HOSPITAL_COMMUNITY): Payer: Self-pay | Admitting: General Practice

## 2013-10-06 ENCOUNTER — Encounter (HOSPITAL_COMMUNITY)
Admission: RE | Disposition: A | Discharge status: Home Health | Payer: Self-pay | Source: Ambulatory Visit | Attending: Vascular Surgery

## 2013-10-06 DIAGNOSIS — Z9981 Dependence on supplemental oxygen: Secondary | ICD-10-CM

## 2013-10-06 DIAGNOSIS — Z79899 Other long term (current) drug therapy: Secondary | ICD-10-CM

## 2013-10-06 DIAGNOSIS — Z23 Encounter for immunization: Secondary | ICD-10-CM

## 2013-10-06 DIAGNOSIS — Z794 Long term (current) use of insulin: Secondary | ICD-10-CM

## 2013-10-06 DIAGNOSIS — I998 Other disorder of circulatory system: Secondary | ICD-10-CM

## 2013-10-06 DIAGNOSIS — L97529 Non-pressure chronic ulcer of other part of left foot with unspecified severity: Secondary | ICD-10-CM | POA: Diagnosis present

## 2013-10-06 DIAGNOSIS — I70245 Atherosclerosis of native arteries of left leg with ulceration of other part of foot: Principal | ICD-10-CM | POA: Diagnosis present

## 2013-10-06 DIAGNOSIS — I7092 Chronic total occlusion of artery of the extremities: Secondary | ICD-10-CM | POA: Diagnosis present

## 2013-10-06 DIAGNOSIS — Z6833 Body mass index (BMI) 33.0-33.9, adult: Secondary | ICD-10-CM

## 2013-10-06 DIAGNOSIS — I5042 Chronic combined systolic (congestive) and diastolic (congestive) heart failure: Secondary | ICD-10-CM | POA: Diagnosis present

## 2013-10-06 DIAGNOSIS — I739 Peripheral vascular disease, unspecified: Secondary | ICD-10-CM | POA: Diagnosis present

## 2013-10-06 DIAGNOSIS — I2510 Atherosclerotic heart disease of native coronary artery without angina pectoris: Secondary | ICD-10-CM | POA: Diagnosis present

## 2013-10-06 DIAGNOSIS — Z7982 Long term (current) use of aspirin: Secondary | ICD-10-CM

## 2013-10-06 DIAGNOSIS — I70229 Atherosclerosis of native arteries of extremities with rest pain, unspecified extremity: Secondary | ICD-10-CM | POA: Diagnosis present

## 2013-10-06 DIAGNOSIS — E782 Mixed hyperlipidemia: Secondary | ICD-10-CM | POA: Diagnosis present

## 2013-10-06 DIAGNOSIS — D689 Coagulation defect, unspecified: Secondary | ICD-10-CM

## 2013-10-06 DIAGNOSIS — I10 Essential (primary) hypertension: Secondary | ICD-10-CM | POA: Diagnosis present

## 2013-10-06 DIAGNOSIS — I252 Old myocardial infarction: Secondary | ICD-10-CM

## 2013-10-06 DIAGNOSIS — Z8249 Family history of ischemic heart disease and other diseases of the circulatory system: Secondary | ICD-10-CM

## 2013-10-06 DIAGNOSIS — Z951 Presence of aortocoronary bypass graft: Secondary | ICD-10-CM

## 2013-10-06 DIAGNOSIS — Z8673 Personal history of transient ischemic attack (TIA), and cerebral infarction without residual deficits: Secondary | ICD-10-CM

## 2013-10-06 DIAGNOSIS — Z87891 Personal history of nicotine dependence: Secondary | ICD-10-CM

## 2013-10-06 DIAGNOSIS — Z955 Presence of coronary angioplasty implant and graft: Secondary | ICD-10-CM

## 2013-10-06 DIAGNOSIS — L98499 Non-pressure chronic ulcer of skin of other sites with unspecified severity: Secondary | ICD-10-CM

## 2013-10-06 DIAGNOSIS — E663 Overweight: Secondary | ICD-10-CM | POA: Diagnosis present

## 2013-10-06 DIAGNOSIS — E11649 Type 2 diabetes mellitus with hypoglycemia without coma: Secondary | ICD-10-CM | POA: Diagnosis present

## 2013-10-06 HISTORY — DX: Personal history of other medical treatment: Z92.89

## 2013-10-06 HISTORY — DX: Dorsalgia, unspecified: M54.9

## 2013-10-06 HISTORY — DX: Other chronic pain: G89.29

## 2013-10-06 HISTORY — DX: Migraine, unspecified, not intractable, without status migrainosus: G43.909

## 2013-10-06 HISTORY — DX: Pneumonia, unspecified organism: J18.9

## 2013-10-06 HISTORY — DX: Dependence on supplemental oxygen: Z99.81

## 2013-10-06 LAB — CBC
HEMATOCRIT: 34 % — AB (ref 36.0–46.0)
HEMOGLOBIN: 11.2 g/dL — AB (ref 12.0–15.0)
MCH: 27.5 pg (ref 26.0–34.0)
MCHC: 32.9 g/dL (ref 30.0–36.0)
MCV: 83.3 fL (ref 78.0–100.0)
Platelets: 198 10*3/uL (ref 150–400)
RBC: 4.08 MIL/uL (ref 3.87–5.11)
RDW: 15.1 % (ref 11.5–15.5)
WBC: 6.5 10*3/uL (ref 4.0–10.5)

## 2013-10-06 LAB — BASIC METABOLIC PANEL
Anion gap: 10 (ref 5–15)
BUN: 30 mg/dL — AB (ref 6–23)
CHLORIDE: 100 meq/L (ref 96–112)
CO2: 27 mEq/L (ref 19–32)
CREATININE: 0.97 mg/dL (ref 0.50–1.10)
Calcium: 9.1 mg/dL (ref 8.4–10.5)
GFR calc Af Amer: 69 mL/min — ABNORMAL LOW (ref >90)
GFR, EST NON AFRICAN AMERICAN: 60 mL/min — AB (ref >90)
Glucose, Bld: 73 mg/dL (ref 70–99)
Potassium: 4.4 mEq/L (ref 3.7–5.3)
Sodium: 137 mEq/L (ref 137–147)

## 2013-10-06 LAB — GLUCOSE, CAPILLARY
GLUCOSE-CAPILLARY: 71 mg/dL (ref 70–99)
GLUCOSE-CAPILLARY: 73 mg/dL (ref 70–99)
GLUCOSE-CAPILLARY: 153 mg/dL — AB (ref 70–99)
Glucose-Capillary: 80 mg/dL (ref 70–99)
Glucose-Capillary: 91 mg/dL (ref 70–99)
Glucose-Capillary: 98 mg/dL (ref 70–99)

## 2013-10-06 LAB — POCT ACTIVATED CLOTTING TIME: Activated Clotting Time: 135 seconds

## 2013-10-06 LAB — PROTIME-INR
INR: 1.15 (ref 0.00–1.49)
Prothrombin Time: 14.7 seconds (ref 11.6–15.2)

## 2013-10-06 SURGERY — PTA FEMORAL POPLITEAL ARTERY
Laterality: Left

## 2013-10-06 MED ORDER — ATROPINE SULFATE 0.1 MG/ML IJ SOLN
INTRAMUSCULAR | Status: AC
  Filled 2013-10-06: qty 10

## 2013-10-06 MED ORDER — CLOPIDOGREL BISULFATE 75 MG PO TABS
75.0000 mg | ORAL_TABLET | Freq: Every day | ORAL | Status: DC
  Administered 2013-10-08 – 2013-10-10 (×3): 75 mg via ORAL
  Filled 2013-10-06 (×4): qty 1

## 2013-10-06 MED ORDER — CEFAZOLIN SODIUM-DEXTROSE 2-3 GM-% IV SOLR
2.0000 g | INTRAVENOUS | Status: AC
  Administered 2013-10-07: 2 g via INTRAVENOUS
  Filled 2013-10-06: qty 50

## 2013-10-06 MED ORDER — INSULIN DETEMIR 100 UNIT/ML ~~LOC~~ SOLN
36.0000 [IU] | Freq: Every day | SUBCUTANEOUS | Status: DC
  Administered 2013-10-06: 20 [IU] via SUBCUTANEOUS
  Administered 2013-10-07 – 2013-10-09 (×3): 36 [IU] via SUBCUTANEOUS
  Filled 2013-10-06 (×6): qty 0.36

## 2013-10-06 MED ORDER — ASPIRIN 81 MG PO CHEW
81.0000 mg | CHEWABLE_TABLET | ORAL | Status: AC
  Administered 2013-10-06: 81 mg via ORAL

## 2013-10-06 MED ORDER — AMLODIPINE BESYLATE 10 MG PO TABS
10.0000 mg | ORAL_TABLET | Freq: Every day | ORAL | Status: DC
  Administered 2013-10-08 – 2013-10-10 (×3): 10 mg via ORAL
  Filled 2013-10-06 (×4): qty 1

## 2013-10-06 MED ORDER — LISINOPRIL 20 MG PO TABS
20.0000 mg | ORAL_TABLET | Freq: Every day | ORAL | Status: DC
  Administered 2013-10-08 – 2013-10-10 (×3): 20 mg via ORAL
  Filled 2013-10-06 (×4): qty 1

## 2013-10-06 MED ORDER — CITALOPRAM HYDROBROMIDE 20 MG PO TABS
20.0000 mg | ORAL_TABLET | Freq: Every day | ORAL | Status: DC
  Administered 2013-10-08 – 2013-10-10 (×3): 20 mg via ORAL
  Filled 2013-10-06 (×4): qty 1

## 2013-10-06 MED ORDER — FENTANYL CITRATE 0.05 MG/ML IJ SOLN
INTRAMUSCULAR | Status: AC
  Filled 2013-10-06: qty 2

## 2013-10-06 MED ORDER — CETYLPYRIDINIUM CHLORIDE 0.05 % MT LIQD
7.0000 mL | Freq: Two times a day (BID) | OROMUCOSAL | Status: DC
  Administered 2013-10-06 – 2013-10-10 (×6): 7 mL via OROMUCOSAL

## 2013-10-06 MED ORDER — CEFAZOLIN SODIUM 1-5 GM-% IV SOLN
1.0000 g | INTRAVENOUS | Status: DC
  Filled 2013-10-06: qty 50

## 2013-10-06 MED ORDER — ONDANSETRON HCL 4 MG/2ML IJ SOLN
4.0000 mg | Freq: Four times a day (QID) | INTRAMUSCULAR | Status: DC | PRN
  Administered 2013-10-06 – 2013-10-08 (×5): 4 mg via INTRAVENOUS
  Filled 2013-10-06 (×3): qty 2

## 2013-10-06 MED ORDER — ONDANSETRON HCL 4 MG/2ML IJ SOLN
4.0000 mg | Freq: Four times a day (QID) | INTRAMUSCULAR | Status: DC | PRN
  Administered 2013-10-06 – 2013-10-08 (×3): 4 mg via INTRAVENOUS
  Filled 2013-10-06 (×6): qty 2

## 2013-10-06 MED ORDER — HEPARIN (PORCINE) IN NACL 2-0.9 UNIT/ML-% IJ SOLN
INTRAMUSCULAR | Status: AC
  Filled 2013-10-06: qty 1000

## 2013-10-06 MED ORDER — MORPHINE SULFATE 2 MG/ML IJ SOLN
2.0000 mg | INTRAMUSCULAR | Status: DC | PRN
  Administered 2013-10-06 – 2013-10-07 (×4): 2 mg via INTRAVENOUS
  Filled 2013-10-06 (×3): qty 1

## 2013-10-06 MED ORDER — HEPARIN SODIUM (PORCINE) 1000 UNIT/ML IJ SOLN
INTRAMUSCULAR | Status: AC
  Filled 2013-10-06: qty 1

## 2013-10-06 MED ORDER — SODIUM CHLORIDE 0.9 % IV SOLN
INTRAVENOUS | Status: DC
  Administered 2013-10-06: 10:00:00 via INTRAVENOUS

## 2013-10-06 MED ORDER — INSULIN ASPART 100 UNIT/ML ~~LOC~~ SOLN: 8.0000 [IU] | Freq: Three times a day (TID) | SUBCUTANEOUS | Status: DC

## 2013-10-06 MED ORDER — MORPHINE SULFATE 2 MG/ML IJ SOLN
INTRAMUSCULAR | Status: AC
  Filled 2013-10-06: qty 1

## 2013-10-06 MED ORDER — INFLUENZA VAC SPLIT QUAD 0.5 ML IM SUSY
0.5000 mL | PREFILLED_SYRINGE | INTRAMUSCULAR | Status: DC
  Filled 2013-10-06: qty 0.5

## 2013-10-06 MED ORDER — SODIUM CHLORIDE 0.9 % IJ SOLN: 3.0000 mL | INTRAMUSCULAR | Status: DC | PRN

## 2013-10-06 MED ORDER — HYDROCODONE-ACETAMINOPHEN 5-325 MG PO TABS
1.0000 | ORAL_TABLET | Freq: Four times a day (QID) | ORAL | Status: DC | PRN
  Administered 2013-10-06 – 2013-10-10 (×5): 1 via ORAL
  Filled 2013-10-06 (×5): qty 1

## 2013-10-06 MED ORDER — FUROSEMIDE 40 MG PO TABS
60.0000 mg | ORAL_TABLET | Freq: Every morning | ORAL | Status: DC
  Administered 2013-10-08 – 2013-10-10 (×3): 60 mg via ORAL
  Filled 2013-10-06 (×5): qty 1

## 2013-10-06 MED ORDER — ADULT MULTIVITAMIN W/MINERALS CH
1.0000 | ORAL_TABLET | Freq: Every day | ORAL | Status: DC
  Administered 2013-10-08 – 2013-10-10 (×3): 1 via ORAL
  Filled 2013-10-06 (×4): qty 1

## 2013-10-06 MED ORDER — ACETAMINOPHEN 325 MG PO TABS: 650.0000 mg | ORAL_TABLET | ORAL | Status: DC | PRN

## 2013-10-06 MED ORDER — LIDOCAINE HCL (PF) 1 % IJ SOLN
INTRAMUSCULAR | Status: AC
  Filled 2013-10-06: qty 30

## 2013-10-06 MED ORDER — ONDANSETRON HCL 4 MG/2ML IJ SOLN
4.0000 mg | Freq: Once | INTRAMUSCULAR | Status: AC
  Administered 2013-10-06: 4 mg via INTRAVENOUS

## 2013-10-06 MED ORDER — ISOSORBIDE MONONITRATE ER 30 MG PO TB24
30.0000 mg | ORAL_TABLET | Freq: Every day | ORAL | Status: DC
  Administered 2013-10-08 – 2013-10-10 (×3): 30 mg via ORAL
  Filled 2013-10-06 (×4): qty 1

## 2013-10-06 MED ORDER — ASPIRIN EC 325 MG PO TBEC
325.0000 mg | DELAYED_RELEASE_TABLET | Freq: Every day | ORAL | Status: DC
  Administered 2013-10-08 – 2013-10-10 (×3): 325 mg via ORAL
  Filled 2013-10-06 (×4): qty 1

## 2013-10-06 MED ORDER — CARVEDILOL 6.25 MG PO TABS
18.7500 mg | ORAL_TABLET | Freq: Two times a day (BID) | ORAL | Status: DC
  Administered 2013-10-06 – 2013-10-10 (×6): 18.75 mg via ORAL
  Filled 2013-10-06 (×10): qty 1

## 2013-10-06 MED ORDER — GLIMEPIRIDE 2 MG PO TABS
2.0000 mg | ORAL_TABLET | Freq: Two times a day (BID) | ORAL | Status: DC
  Administered 2013-10-07 – 2013-10-10 (×6): 2 mg via ORAL
  Filled 2013-10-06 (×9): qty 1

## 2013-10-06 MED ORDER — INSULIN ASPART 100 UNIT/ML ~~LOC~~ SOLN
0.0000 [IU] | Freq: Three times a day (TID) | SUBCUTANEOUS | Status: DC
  Administered 2013-10-08: 3 [IU] via SUBCUTANEOUS
  Administered 2013-10-09: 2 [IU] via SUBCUTANEOUS

## 2013-10-06 MED ORDER — ATORVASTATIN CALCIUM 40 MG PO TABS
40.0000 mg | ORAL_TABLET | Freq: Every day | ORAL | Status: DC
  Administered 2013-10-06 – 2013-10-09 (×4): 40 mg via ORAL
  Filled 2013-10-06 (×6): qty 1

## 2013-10-06 MED ORDER — ASPIRIN 81 MG PO TBEC: 81.0000 mg | DELAYED_RELEASE_TABLET | Freq: Every day | ORAL | Status: DC

## 2013-10-06 MED ORDER — SODIUM CHLORIDE 0.9 % IV SOLN: INTRAVENOUS | Status: AC

## 2013-10-06 MED ORDER — ASPIRIN 81 MG PO CHEW
CHEWABLE_TABLET | ORAL | Status: AC
  Administered 2013-10-06: 81 mg via ORAL
  Filled 2013-10-06: qty 1

## 2013-10-06 MED ORDER — POTASSIUM CHLORIDE ER 10 MEQ PO TBCR
10.0000 meq | EXTENDED_RELEASE_TABLET | Freq: Every day | ORAL | Status: DC
  Administered 2013-10-08 – 2013-10-10 (×3): 10 meq via ORAL
  Filled 2013-10-06 (×4): qty 1

## 2013-10-06 MED ORDER — MIDAZOLAM HCL 2 MG/2ML IJ SOLN
INTRAMUSCULAR | Status: AC
  Filled 2013-10-06: qty 2

## 2013-10-06 MED ORDER — NITROGLYCERIN 0.4 MG SL SUBL: 0.4000 mg | SUBLINGUAL_TABLET | SUBLINGUAL | Status: DC | PRN

## 2013-10-06 MED ORDER — HYDRALAZINE HCL 20 MG/ML IJ SOLN
10.0000 mg | INTRAMUSCULAR | Status: DC | PRN
  Administered 2013-10-06 – 2013-10-07 (×2): 10 mg via INTRAVENOUS
  Filled 2013-10-06 (×4): qty 1

## 2013-10-06 NOTE — CV Procedure (Signed)
[name] [name] is a 68 y.o. female    [id number] LOCATION:  FACILITY: [facility]  PHYSICIAN: Quay Burow, M.D. 1945-03-05   DATE OF PROCEDURE:  10/06/2013  DATE OF DISCHARGE:     PV Angiogram/Intervention    History obtained from chart review.[name] is a 68 year old moderately overweight Caucasian female mother of 4 children, grandmother of the grandchildren referred by Dr. Ladona Horns for critical limb ischemia. Her primary care physician is Dr. Allie Dimmer cardiovascular risk factor profile is remarkable for 100-150-pack-years of tobacco abuse having quit 3 years ago prior to her bypass graft surgery. She has treated hypertension, diabetes and hyperlipidemia. Her father died of a myocardial infarction. She has had a gait MRI as well as a stroke. She a priori bypass grafting x4 in 2004 by Dr. Tharon Aquas Trigt She's also had bilateral carotid endarterectomies. She has had a nonhealing ulcer on the plantar surface of her left foot for 6 months. Lower extremity arterial Doppler studies performed in the office 08/27/13 revealed a left ABI of 0.62 with occluded left SFA. I intubated her on 09/08/13 revealing an occluded SFA the origin with reconstitution hunters canal two-vessel runoff the ulcer on the bottom of her foot has progressed and she has a new ulcer on the lateral aspect of her left calf. Based on this I decided to proceed with attempt at percutaneous vascularization. This approach was discussed with Dr. Gae Gallop.      PROCEDURE DESCRIPTION:   The patient was brought to the second floor [place] [facility] cath lab in the postabsorptive state. She waspremedicated with Valium 5 mg by mouth, IV Versed and fentanyl. Her right groinwas prepped and shaved in usual sterile fashion. Xylocaine 1% was used for local anesthesia. A 7 French sheath was inserted into the right common femoral artery using standard Seldinger technique. The patient received  9000 units  of heparin  intravenously.       HEMODYNAMICS:    AO SYSTOLIC/AO DIASTOLIC: 956/38   Angiographic Data:   I obtained Contralateral access with a 5 [place] crossover catheter and 7 [place] multipurpose destination sheath. The patient received a total of 9000 units of heparin with an ACT of 202. A total of 150 cc of contrast was administered to the patient. I attempted to cross the left superficial femoral artery chronic total occlusion with a Viance CTO  catheter and an 014/300 cm long [place] core wire. I was unsuccessful in reaching intraluminal access because I had gone out a subintimal plane. I then exchanged for a 35 angled Navicross  endhole catheter along with an 035 angled stiff Glidewire. Similarly I was unable to regain access to the true lumen and therefore the procedure was abandoned.  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 The patient will need femoropopliteal bypassing by Dr. Gae Gallop . The sheath will be removed once the ACT falls below 170 pressure will be held. The patient will be hydrated and discharged home in the morning.    [name] MD, [facility] 10/06/2013 12:25 PM

## 2013-10-06 NOTE — Progress Notes (Addendum)
Pts CBG is 71. Pt states she is feeling a little shaky.Dawayne Cirri R.N.notified. Will recheck in 15 min

## 2013-10-06 NOTE — Progress Notes (Signed)
[facility]  Patient Status: Active (receiving services up to time of hospitalization)  [facility] is providing the following services: RN - wound care  If patient discharges after hours, please call [phone].   [name] [name] 10/06/2013, 2:42 PM

## 2013-10-06 NOTE — Progress Notes (Signed)
Vascular Surgery:  Patient has critical limb ischemia with a non-healing wound of the left foot. Endovascular approach was not possible today. I had a late cancellation and therefore have added her onto the tomorrow's schedule in AM. I spoke with the patient and she is agreeable. I have reviewed the indications for lower extremity bypass. I have also reviewed the potential complications of surgery including but not limited to: wound healing problems, infection, graft thrombosis, limb loss, or other unpredictable medical problems. All the patient's questions were answered and they are agreeable to proceed.  Deitra Mayo, MD, FACS Beeper [phone] 10/06/2013'

## 2013-10-06 NOTE — H&P (View-Only) (Signed)
10/02/2013 Tenna Child Seehafer   08-Nov-1945  [id number]  Primary Physician Glenda Chroman., MD Primary Cardiologist: [name] MD [name]    HPI:  Ms. Hartung is a 68 year old moderately overweight Caucasian female mother of 4 children, grandmother of the grandchildren referred by Dr. Ladona Horns for critical limb ischemia. Her primary care physician is Dr. Allie Dimmer cardiovascular risk factor profile is remarkable for 100-150-pack-years of tobacco abuse having quit 3 years ago prior to her bypass graft surgery. She has treated hypertension, diabetes and hyperlipidemia. Her father died of a myocardial infarction. She has had a gait MRI as well as a stroke. She a priori bypass grafting x4 in 2004 by Dr. Tharon Aquas Trigt She's also had bilateral carotid endarterectomies. She has had a nonhealing ulcer on the plantar surface of her left foot for 6 months. Lower extremity arterial Doppler studies performed in the office 08/27/13 revealed a left ABI of 0.62 with occluded left SFA. I intubated her on 09/08/13 revealing an occluded SFA the origin with reconstitution hunters canal two-vessel runoff the ulcer on the bottom of her foot has progressed and she has a new ulcer on the lateral aspect of her left calf. Based on this I decided to proceed with attempt at percutaneous vascularization. I will discuss this with Dr. Scot Dock..   Current Outpatient Prescriptions  Medication Sig Dispense Refill  . amLODipine (NORVASC) 10 MG tablet Take 10 mg by mouth daily.      [name] aspirin 81 MG EC tablet Take 81 mg by mouth daily.      [name] atorvastatin (LIPITOR) 40 MG tablet Take 40 mg by mouth daily at 6 PM.      . carvedilol (COREG) 12.5 MG tablet Take 18.75 mg by mouth 2 (two) times daily.      . citalopram (CELEXA) 20 MG tablet Take 20 mg by mouth daily.      . clopidogrel (PLAVIX) 75 MG tablet Take 75 mg by mouth daily with breakfast.      . furosemide (LASIX) 40 MG tablet Take 60 mg by mouth every morning.       [name] glimepiride (AMARYL) 2 MG tablet Take 2 mg by mouth 2 (two) times daily.       [name] HYDROcodone-acetaminophen (NORCO/VICODIN) 5-325 MG per tablet Take 1 tablet by mouth daily as needed.       . insulin aspart (NOVOLOG) 100 UNIT/ML injection Inject 8 Units into the skin 3 (three) times daily with meals.      . insulin detemir (LEVEMIR) 100 UNIT/ML injection Inject 36 Units into the skin at bedtime.       . isosorbide mononitrate (IMDUR) 30 MG 24 hr tablet Take 30 mg by mouth daily.      [name] lisinopril (PRINIVIL,ZESTRIL) 20 MG tablet Take 20 mg by mouth daily.      . Multiple Vitamin (MULTIVITAMIN WITH MINERALS) TABS tablet Take 1 tablet by mouth daily.      . nitroGLYCERIN (NITROSTAT) 0.4 MG SL tablet Place 1 tablet (0.4 mg total) under the tongue every 5 (five) minutes as needed for chest pain. Up to 3 doses. If no relief after 3rd dose, proceed to the ED for an evaluation  25 tablet  3  . potassium chloride (K-DUR) 10 MEQ tablet Take 10 mEq by mouth daily.      [name] PRESCRIPTION MEDICATION 2 liters of Oxygen       No current facility-administered medications for this visit.    Allergies  Allergen Reactions  . Other Shortness Of Breath and Rash    All berries   . Strawberry Hives, Shortness Of Breath and Rash  . Sulfa Antibiotics Swelling    History   Social History  . Marital Status: Widowed    Spouse Name: N/A    Number of Children: N/A  . Years of Education: N/A   Occupational History  . Not on file.   Social History Main Topics  . Smoking status: Former Smoker -- 1.00 packs/day for 50 years    Types: Cigarettes    Quit date: 10/09/2009  . Smokeless tobacco: Never Used     Comment: SMOKED FOR 50 YEARS - up to 2 packs/day for much of that time.  . Alcohol Use: Yes     Comment: Very rare.  . Drug Use: No  . Sexual Activity: Not on file   Other Topics Concern  . Not on file   Social History Narrative   Lives in [place] by herself.  She does not work.     Review of  Systems: General: negative for chills, fever, night sweats or weight changes.  Cardiovascular: negative for chest pain, dyspnea on exertion, edema, orthopnea, palpitations, paroxysmal nocturnal dyspnea or shortness of breath Dermatological: negative for rash Respiratory: negative for cough or wheezing Urologic: negative for hematuria Abdominal: negative for nausea, vomiting, diarrhea, bright red blood per rectum, melena, or hematemesis Neurologic: negative for visual changes, syncope, or dizziness All other systems reviewed and are otherwise negative except as noted above.    Blood pressure 208/74, pulse 64, height _0  (1.676 m), weight 207 lb (93.895 kg).  General appearance: alert and no distress Neck: no adenopathy, no carotid bruit, no JVD, supple, symmetrical, trachea midline and thyroid not enlarged, symmetric, no tenderness/mass/nodules Lungs: clear to auscultation bilaterally Heart: regular rate and rhythm, S1, S2 normal, no murmur, click, rub or gallop Extremities: extremities normal, atraumatic, no cyanosis or edema  EKG not performed today  ASSESSMENT AND PLAN:   PAD (peripheral artery disease) [name] has critical limb ischemia. I intraventricular H./31/15 revealing an occluded SFA the origin with reconstitution in Hunter's canal two-vessel runoff. I initially sent her to Dr. Scot Dock for consideration of surgical revascularization however vein mapping revealed no suitable venous conduit and she would need PTFE which I do not think his optimal I discussed the case with Dr. Doren Custard and we've agreed to attempt percutaneous catheterization and reserve surgical requisition for failure of the percutaneous approach.      [name] MD FACP,FACC,FAHA, [facility] 10/02/2013 4:45 PM

## 2013-10-06 NOTE — Progress Notes (Signed)
Pts repeat CBG is 73. Pt states she feels better and is no longer shaky and feels like she was nervous about the IV. Instructed pt to let us know if she felt hypoglycemic. Symptoms reviewed with pt.  Anderson Malta R.N> made aware of CBG results. We will continue to monitor. Reported to cath lab

## 2013-10-06 NOTE — Interval H&P Note (Signed)
History and Physical Interval Note:  10/06/2013 11:08 AM  [name] [name]  has presented today for surgery, with the diagnosis of critical limb ischemia  The various methods of treatment have been discussed with the patient and family. After consideration of risks, benefits and other options for treatment, the patient has consented to  Procedure(s): LOWER EXTREMITY ANGIOGRAM (N/A) as a surgical intervention .  The patient's history has been reviewed, patient examined, no change in status, stable for surgery.  I have reviewed the patient's chart and labs.  Questions were answered to the patient's satisfaction.     [name]

## 2013-10-06 NOTE — Progress Notes (Signed)
Site area: right groin  Site Prior to Removal:  Level 0  Pressure Applied For 20 MINUTES    Minutes Beginning at 1542  Manual:   Yes.    Patient Status During Pull:  AAO X 4  Post Pull Groin Site:  Level 0  Post Pull Instructions Given:  Yes.    Post Pull Pulses Present:  Yes.    Dressing Applied:  Yes.    Comments:  Tolerated procedure well

## 2013-10-07 ENCOUNTER — Ambulatory Visit (HOSPITAL_COMMUNITY): Payer: Medicare Other | Admitting: Anesthesiology

## 2013-10-07 ENCOUNTER — Ambulatory Visit (HOSPITAL_COMMUNITY): Payer: Medicare Other

## 2013-10-07 ENCOUNTER — Encounter (HOSPITAL_COMMUNITY): Payer: Self-pay | Admitting: Certified Registered"

## 2013-10-07 ENCOUNTER — Encounter (HOSPITAL_COMMUNITY)
Admission: RE | Disposition: A | Discharge status: Home Health | Payer: Self-pay | Source: Ambulatory Visit | Attending: Vascular Surgery

## 2013-10-07 ENCOUNTER — Encounter (HOSPITAL_COMMUNITY): Payer: Medicare Other | Admitting: Anesthesiology

## 2013-10-07 DIAGNOSIS — Z8673 Personal history of transient ischemic attack (TIA), and cerebral infarction without residual deficits: Secondary | ICD-10-CM | POA: Diagnosis not present

## 2013-10-07 DIAGNOSIS — Z87891 Personal history of nicotine dependence: Secondary | ICD-10-CM | POA: Diagnosis not present

## 2013-10-07 DIAGNOSIS — E782 Mixed hyperlipidemia: Secondary | ICD-10-CM | POA: Diagnosis not present

## 2013-10-07 DIAGNOSIS — L97529 Non-pressure chronic ulcer of other part of left foot with unspecified severity: Secondary | ICD-10-CM | POA: Diagnosis not present

## 2013-10-07 DIAGNOSIS — Z79899 Other long term (current) drug therapy: Secondary | ICD-10-CM | POA: Diagnosis not present

## 2013-10-07 DIAGNOSIS — I509 Heart failure, unspecified: Secondary | ICD-10-CM | POA: Diagnosis not present

## 2013-10-07 DIAGNOSIS — I5042 Chronic combined systolic (congestive) and diastolic (congestive) heart failure: Secondary | ICD-10-CM | POA: Diagnosis present

## 2013-10-07 DIAGNOSIS — I739 Peripheral vascular disease, unspecified: Secondary | ICD-10-CM

## 2013-10-07 DIAGNOSIS — Z955 Presence of coronary angioplasty implant and graft: Secondary | ICD-10-CM | POA: Diagnosis not present

## 2013-10-07 DIAGNOSIS — I999 Unspecified disorder of circulatory system: Secondary | ICD-10-CM

## 2013-10-07 DIAGNOSIS — I70242 Atherosclerosis of native arteries of left leg with ulceration of calf: Secondary | ICD-10-CM | POA: Diagnosis present

## 2013-10-07 DIAGNOSIS — Z794 Long term (current) use of insulin: Secondary | ICD-10-CM | POA: Diagnosis not present

## 2013-10-07 DIAGNOSIS — E663 Overweight: Secondary | ICD-10-CM | POA: Diagnosis not present

## 2013-10-07 DIAGNOSIS — I252 Old myocardial infarction: Secondary | ICD-10-CM | POA: Diagnosis not present

## 2013-10-07 DIAGNOSIS — L98499 Non-pressure chronic ulcer of skin of other sites with unspecified severity: Secondary | ICD-10-CM

## 2013-10-07 DIAGNOSIS — E11649 Type 2 diabetes mellitus with hypoglycemia without coma: Secondary | ICD-10-CM | POA: Diagnosis present

## 2013-10-07 DIAGNOSIS — I10 Essential (primary) hypertension: Secondary | ICD-10-CM | POA: Diagnosis not present

## 2013-10-07 DIAGNOSIS — Z6833 Body mass index (BMI) 33.0-33.9, adult: Secondary | ICD-10-CM | POA: Diagnosis not present

## 2013-10-07 DIAGNOSIS — I7092 Chronic total occlusion of artery of the extremities: Secondary | ICD-10-CM | POA: Diagnosis present

## 2013-10-07 DIAGNOSIS — Z951 Presence of aortocoronary bypass graft: Secondary | ICD-10-CM | POA: Diagnosis not present

## 2013-10-07 DIAGNOSIS — I2510 Atherosclerotic heart disease of native coronary artery without angina pectoris: Secondary | ICD-10-CM | POA: Diagnosis not present

## 2013-10-07 DIAGNOSIS — Z9981 Dependence on supplemental oxygen: Secondary | ICD-10-CM | POA: Diagnosis not present

## 2013-10-07 DIAGNOSIS — Z7982 Long term (current) use of aspirin: Secondary | ICD-10-CM | POA: Diagnosis not present

## 2013-10-07 DIAGNOSIS — Z8249 Family history of ischemic heart disease and other diseases of the circulatory system: Secondary | ICD-10-CM | POA: Diagnosis not present

## 2013-10-07 DIAGNOSIS — I70245 Atherosclerosis of native arteries of left leg with ulceration of other part of foot: Secondary | ICD-10-CM | POA: Diagnosis present

## 2013-10-07 HISTORY — PX: FEMORAL-POPLITEAL BYPASS GRAFT: SHX937

## 2013-10-07 HISTORY — PX: INTRAOPERATIVE ARTERIOGRAM: SHX5157

## 2013-10-07 HISTORY — PX: ENDARTERECTOMY POPLITEAL: SHX5806

## 2013-10-07 LAB — TYPE AND SCREEN
ABO/RH(D): O POS
Antibody Screen: NEGATIVE

## 2013-10-07 LAB — SURGICAL PCR SCREEN
MRSA, PCR: POSITIVE — AB
STAPHYLOCOCCUS AUREUS: POSITIVE — AB

## 2013-10-07 LAB — GLUCOSE, CAPILLARY
GLUCOSE-CAPILLARY: 105 mg/dL — AB (ref 70–99)
Glucose-Capillary: 135 mg/dL — ABNORMAL HIGH (ref 70–99)
Glucose-Capillary: 173 mg/dL — ABNORMAL HIGH (ref 70–99)

## 2013-10-07 LAB — BASIC METABOLIC PANEL
Anion gap: 10 (ref 5–15)
BUN: 26 mg/dL — AB (ref 6–23)
CALCIUM: 8.6 mg/dL (ref 8.4–10.5)
CHLORIDE: 104 meq/L (ref 96–112)
CO2: 26 meq/L (ref 19–32)
CREATININE: 1.05 mg/dL (ref 0.50–1.10)
GFR calc Af Amer: 63 mL/min — ABNORMAL LOW (ref >90)
GFR calc non Af Amer: 54 mL/min — ABNORMAL LOW (ref >90)
GLUCOSE: 111 mg/dL — AB (ref 70–99)
Potassium: 4.5 mEq/L (ref 3.7–5.3)
Sodium: 140 mEq/L (ref 137–147)

## 2013-10-07 LAB — CBC
HEMATOCRIT: 32.7 % — AB (ref 36.0–46.0)
Hemoglobin: 10.5 g/dL — ABNORMAL LOW (ref 12.0–15.0)
MCH: 27.3 pg (ref 26.0–34.0)
MCHC: 32.1 g/dL (ref 30.0–36.0)
MCV: 84.9 fL (ref 78.0–100.0)
Platelets: 175 10*3/uL (ref 150–400)
RBC: 3.85 MIL/uL — AB (ref 3.87–5.11)
RDW: 15.5 % (ref 11.5–15.5)
WBC: 7.1 10*3/uL (ref 4.0–10.5)

## 2013-10-07 LAB — POCT ACTIVATED CLOTTING TIME
ACTIVATED CLOTTING TIME: 202 s
ACTIVATED CLOTTING TIME: 202 s

## 2013-10-07 SURGERY — BYPASS GRAFT FEMORAL-POPLITEAL ARTERY
Anesthesia: General | Site: Leg Upper | Laterality: Left

## 2013-10-07 MED ORDER — ACETAMINOPHEN 325 MG PO TABS: 325.0000 mg | ORAL_TABLET | ORAL | Status: DC | PRN

## 2013-10-07 MED ORDER — LIDOCAINE HCL (CARDIAC) 20 MG/ML IV SOLN
INTRAVENOUS | Status: DC | PRN
Start: 2013-10-07 — End: 2013-10-07
  Administered 2013-10-07: 60 mg via INTRAVENOUS

## 2013-10-07 MED ORDER — DEXAMETHASONE SODIUM PHOSPHATE 4 MG/ML IJ SOLN
INTRAMUSCULAR | Status: DC | PRN
  Administered 2013-10-07: 4 mg via INTRAVENOUS

## 2013-10-07 MED ORDER — FENTANYL CITRATE 0.05 MG/ML IJ SOLN
25.0000 ug | INTRAMUSCULAR | Status: DC | PRN
  Administered 2013-10-07 (×3): 50 ug via INTRAVENOUS

## 2013-10-07 MED ORDER — NEOSTIGMINE METHYLSULFATE 10 MG/10ML IV SOLN
INTRAVENOUS | Status: DC | PRN
  Administered 2013-10-07: 3 mg via INTRAVENOUS

## 2013-10-07 MED ORDER — FENTANYL CITRATE 0.05 MG/ML IJ SOLN
INTRAMUSCULAR | Status: AC
  Filled 2013-10-07: qty 2

## 2013-10-07 MED ORDER — ROCURONIUM BROMIDE 50 MG/5ML IV SOLN
INTRAVENOUS | Status: AC
  Filled 2013-10-07: qty 1

## 2013-10-07 MED ORDER — THROMBIN 20000 UNITS EX SOLR
CUTANEOUS | Status: DC | PRN
  Administered 2013-10-07: 09:00:00 via TOPICAL

## 2013-10-07 MED ORDER — MAGNESIUM SULFATE 40 MG/ML IJ SOLN
2.0000 g | Freq: Every day | INTRAMUSCULAR | Status: DC | PRN
  Filled 2013-10-07: qty 50

## 2013-10-07 MED ORDER — LABETALOL HCL 5 MG/ML IV SOLN
10.0000 mg | INTRAVENOUS | Status: DC | PRN
  Filled 2013-10-07: qty 4

## 2013-10-07 MED ORDER — ONDANSETRON HCL 4 MG/2ML IJ SOLN
INTRAMUSCULAR | Status: DC | PRN
  Administered 2013-10-07: 4 mg via INTRAVENOUS

## 2013-10-07 MED ORDER — ACETAMINOPHEN 650 MG RE SUPP: 325.0000 mg | RECTAL | Status: DC | PRN

## 2013-10-07 MED ORDER — SODIUM CHLORIDE 0.9 % IJ SOLN
INTRAMUSCULAR | Status: AC
  Filled 2013-10-07: qty 10

## 2013-10-07 MED ORDER — DEXAMETHASONE SODIUM PHOSPHATE 4 MG/ML IJ SOLN
INTRAMUSCULAR | Status: AC
  Filled 2013-10-07: qty 1

## 2013-10-07 MED ORDER — LACTATED RINGERS IV SOLN
INTRAVENOUS | Status: DC | PRN
  Administered 2013-10-07 (×2): via INTRAVENOUS

## 2013-10-07 MED ORDER — DOPAMINE-DEXTROSE 3.2-5 MG/ML-% IV SOLN: 3.0000 ug/kg/min | INTRAVENOUS | Status: DC

## 2013-10-07 MED ORDER — PROPOFOL 10 MG/ML IV BOLUS
INTRAVENOUS | Status: DC | PRN
  Administered 2013-10-07: 100 mg via INTRAVENOUS

## 2013-10-07 MED ORDER — PROTAMINE SULFATE 10 MG/ML IV SOLN
INTRAVENOUS | Status: AC
  Filled 2013-10-07: qty 5

## 2013-10-07 MED ORDER — CEFAZOLIN SODIUM-DEXTROSE 2-3 GM-% IV SOLR
INTRAVENOUS | Status: DC | PRN
  Administered 2013-10-07: 2 g via INTRAVENOUS

## 2013-10-07 MED ORDER — NEOSTIGMINE METHYLSULFATE 10 MG/10ML IV SOLN
INTRAVENOUS | Status: AC
  Filled 2013-10-07: qty 1

## 2013-10-07 MED ORDER — HEPARIN SODIUM (PORCINE) 1000 UNIT/ML IJ SOLN
INTRAMUSCULAR | Status: AC
  Filled 2013-10-07: qty 1

## 2013-10-07 MED ORDER — ROCURONIUM BROMIDE 100 MG/10ML IV SOLN
INTRAVENOUS | Status: DC | PRN
  Administered 2013-10-07: 40 mg via INTRAVENOUS

## 2013-10-07 MED ORDER — LIDOCAINE HCL (CARDIAC) 20 MG/ML IV SOLN
INTRAVENOUS | Status: AC
  Filled 2013-10-07: qty 5

## 2013-10-07 MED ORDER — MIDAZOLAM HCL 5 MG/5ML IJ SOLN
INTRAMUSCULAR | Status: DC | PRN
  Administered 2013-10-07: 2 mg via INTRAVENOUS

## 2013-10-07 MED ORDER — PHENOL 1.4 % MT LIQD: 1.0000 | OROMUCOSAL | Status: DC | PRN

## 2013-10-07 MED ORDER — PROTAMINE SULFATE 10 MG/ML IV SOLN
INTRAVENOUS | Status: DC | PRN
  Administered 2013-10-07 (×2): 15 mg via INTRAVENOUS
  Administered 2013-10-07: 10 mg via INTRAVENOUS

## 2013-10-07 MED ORDER — POTASSIUM CHLORIDE CRYS ER 20 MEQ PO TBCR: 20.0000 meq | EXTENDED_RELEASE_TABLET | Freq: Every day | ORAL | Status: DC | PRN

## 2013-10-07 MED ORDER — OXYCODONE-ACETAMINOPHEN 5-325 MG PO TABS: 1.0000 | ORAL_TABLET | ORAL | Status: DC | PRN

## 2013-10-07 MED ORDER — FENTANYL CITRATE 0.05 MG/ML IJ SOLN
INTRAMUSCULAR | Status: DC | PRN
  Administered 2013-10-07: 100 ug via INTRAVENOUS
  Administered 2013-10-07 (×2): 50 ug via INTRAVENOUS

## 2013-10-07 MED ORDER — PROMETHAZINE HCL 25 MG/ML IJ SOLN: 6.2500 mg | INTRAMUSCULAR | Status: DC | PRN

## 2013-10-07 MED ORDER — PHENYLEPHRINE 40 MCG/ML (10ML) SYRINGE FOR IV PUSH (FOR BLOOD PRESSURE SUPPORT)
PREFILLED_SYRINGE | INTRAVENOUS | Status: AC
  Filled 2013-10-07: qty 10

## 2013-10-07 MED ORDER — DOCUSATE SODIUM 100 MG PO CAPS
100.0000 mg | ORAL_CAPSULE | Freq: Every day | ORAL | Status: DC
  Administered 2013-10-08 – 2013-10-09 (×2): 100 mg via ORAL
  Filled 2013-10-07 (×3): qty 1

## 2013-10-07 MED ORDER — THROMBIN 20000 UNITS EX SOLR
CUTANEOUS | Status: AC
  Filled 2013-10-07: qty 20000

## 2013-10-07 MED ORDER — MIDAZOLAM HCL 2 MG/2ML IJ SOLN
INTRAMUSCULAR | Status: AC
  Filled 2013-10-07: qty 2

## 2013-10-07 MED ORDER — GUAIFENESIN-DM 100-10 MG/5ML PO SYRP: 15.0000 mL | ORAL_SOLUTION | ORAL | Status: DC | PRN

## 2013-10-07 MED ORDER — 0.9 % SODIUM CHLORIDE (POUR BTL) OPTIME
TOPICAL | Status: DC | PRN
  Administered 2013-10-07 (×2): 1000 mL

## 2013-10-07 MED ORDER — ONDANSETRON HCL 4 MG/2ML IJ SOLN: 4.0000 mg | Freq: Four times a day (QID) | INTRAMUSCULAR | Status: DC | PRN

## 2013-10-07 MED ORDER — GLYCOPYRROLATE 0.2 MG/ML IJ SOLN
INTRAMUSCULAR | Status: AC
  Filled 2013-10-07: qty 2

## 2013-10-07 MED ORDER — MORPHINE SULFATE 2 MG/ML IJ SOLN
2.0000 mg | INTRAMUSCULAR | Status: DC | PRN
  Administered 2013-10-07 – 2013-10-08 (×10): 2 mg via INTRAVENOUS
  Filled 2013-10-07 (×9): qty 1

## 2013-10-07 MED ORDER — CEFAZOLIN SODIUM-DEXTROSE 2-3 GM-% IV SOLR
INTRAVENOUS | Status: AC
  Filled 2013-10-07: qty 50

## 2013-10-07 MED ORDER — ALUM & MAG HYDROXIDE-SIMETH 200-200-20 MG/5ML PO SUSP: 15.0000 mL | ORAL | Status: DC | PRN

## 2013-10-07 MED ORDER — PANTOPRAZOLE SODIUM 40 MG PO TBEC
40.0000 mg | DELAYED_RELEASE_TABLET | Freq: Every day | ORAL | Status: DC
  Administered 2013-10-08 – 2013-10-10 (×3): 40 mg via ORAL
  Filled 2013-10-07 (×3): qty 1

## 2013-10-07 MED ORDER — DEXTROSE 5 % IV SOLN
1.5000 g | Freq: Two times a day (BID) | INTRAVENOUS | Status: AC
  Administered 2013-10-07 – 2013-10-08 (×2): 1.5 g via INTRAVENOUS
  Filled 2013-10-07 (×2): qty 1.5

## 2013-10-07 MED ORDER — ONDANSETRON HCL 4 MG/2ML IJ SOLN
INTRAMUSCULAR | Status: AC
  Filled 2013-10-07: qty 2

## 2013-10-07 MED ORDER — METOPROLOL TARTRATE 1 MG/ML IV SOLN: 2.0000 mg | INTRAVENOUS | Status: DC | PRN

## 2013-10-07 MED ORDER — PAPAVERINE HCL 30 MG/ML IJ SOLN
INTRAMUSCULAR | Status: AC
  Filled 2013-10-07: qty 2

## 2013-10-07 MED ORDER — FENTANYL CITRATE 0.05 MG/ML IJ SOLN
INTRAMUSCULAR | Status: AC
  Filled 2013-10-07: qty 5

## 2013-10-07 MED ORDER — PROPOFOL 10 MG/ML IV BOLUS
INTRAVENOUS | Status: AC
Start: 2013-10-07 — End: 2013-10-07
  Filled 2013-10-07: qty 20

## 2013-10-07 MED ORDER — EPHEDRINE SULFATE 50 MG/ML IJ SOLN
INTRAMUSCULAR | Status: DC | PRN
  Administered 2013-10-07: 5 mg via INTRAVENOUS
  Administered 2013-10-07 (×3): 10 mg via INTRAVENOUS
  Administered 2013-10-07: 5 mg via INTRAVENOUS

## 2013-10-07 MED ORDER — SUCCINYLCHOLINE CHLORIDE 20 MG/ML IJ SOLN
INTRAMUSCULAR | Status: AC
  Filled 2013-10-07: qty 1

## 2013-10-07 MED ORDER — HYDRALAZINE HCL 20 MG/ML IJ SOLN
10.0000 mg | INTRAMUSCULAR | Status: DC | PRN
Start: 2013-10-07 — End: 2013-10-10
  Administered 2013-10-07: 10 mg via INTRAVENOUS

## 2013-10-07 MED ORDER — GLYCOPYRROLATE 0.2 MG/ML IJ SOLN
INTRAMUSCULAR | Status: DC | PRN
  Administered 2013-10-07 (×2): 0.4 mg via INTRAVENOUS

## 2013-10-07 MED ORDER — EPHEDRINE SULFATE 50 MG/ML IJ SOLN
INTRAMUSCULAR | Status: AC
  Filled 2013-10-07: qty 1

## 2013-10-07 MED ORDER — SODIUM CHLORIDE 0.9 % IV SOLN
INTRAVENOUS | Status: DC
  Administered 2013-10-07 – 2013-10-08 (×2): via INTRAVENOUS

## 2013-10-07 MED ORDER — IOHEXOL 300 MG/ML  SOLN
INTRAMUSCULAR | Status: DC | PRN
  Administered 2013-10-07: 26 mL via INTRAVENOUS

## 2013-10-07 MED ORDER — SODIUM CHLORIDE 0.9 % IR SOLN
Status: DC | PRN
  Administered 2013-10-07: 08:00:00

## 2013-10-07 MED ORDER — ENOXAPARIN SODIUM 30 MG/0.3ML ~~LOC~~ SOLN
30.0000 mg | SUBCUTANEOUS | Status: DC
  Administered 2013-10-08 – 2013-10-10 (×3): 30 mg via SUBCUTANEOUS
  Filled 2013-10-07 (×4): qty 0.3

## 2013-10-07 MED ORDER — SODIUM CHLORIDE 0.9 % IV SOLN: 500.0000 mL | Freq: Once | INTRAVENOUS | Status: AC | PRN

## 2013-10-07 MED ORDER — HEPARIN SODIUM (PORCINE) 1000 UNIT/ML IJ SOLN
INTRAMUSCULAR | Status: DC | PRN
  Administered 2013-10-07: 8000 [IU] via INTRAVENOUS

## 2013-10-07 SURGICAL SUPPLY — 66 items
ADH SKN CLS APL DERMABOND .7 (GAUZE/BANDAGES/DRESSINGS) ×1
BANDAGE ELASTIC 4 VELCRO ST LF (GAUZE/BANDAGES/DRESSINGS) IMPLANT
BANDAGE ESMARK 6X9 LF (GAUZE/BANDAGES/DRESSINGS) IMPLANT
BNDG CMPR 9X6 STRL LF SNTH (GAUZE/BANDAGES/DRESSINGS) ×1
BNDG ESMARK 6X9 LF (GAUZE/BANDAGES/DRESSINGS) ×3
CANISTER SUCTION 2500CC (MISCELLANEOUS) ×3 IMPLANT
CANNULA VESSEL 3MM 2 BLNT TIP (CANNULA) ×3 IMPLANT
CLIP TI MEDIUM 24 (CLIP) ×3 IMPLANT
CLIP TI WIDE RED SMALL 24 (CLIP) ×3 IMPLANT
CUFF TOURNIQUET SINGLE 24IN (TOURNIQUET CUFF) IMPLANT
CUFF TOURNIQUET SINGLE 34IN LL (TOURNIQUET CUFF) ×2 IMPLANT
CUFF TOURNIQUET SINGLE 44IN (TOURNIQUET CUFF) IMPLANT
DERMABOND ADVANCED (GAUZE/BANDAGES/DRESSINGS) ×2
DERMABOND ADVANCED .7 DNX12 (GAUZE/BANDAGES/DRESSINGS) IMPLANT
DRAIN CHANNEL 15F RND FF W/TCR (WOUND CARE) IMPLANT
DRAPE X-RAY CASS 24X20 (DRAPES) ×2 IMPLANT
DRSG COVADERM 4X10 (GAUZE/BANDAGES/DRESSINGS) IMPLANT
DRSG COVADERM 4X8 (GAUZE/BANDAGES/DRESSINGS) IMPLANT
ELECT REM PT RETURN 9FT ADLT (ELECTROSURGICAL) ×3
ELECTRODE REM PT RTRN 9FT ADLT (ELECTROSURGICAL) ×1 IMPLANT
EVACUATOR SILICONE 100CC (DRAIN) IMPLANT
GLOVE BIO SURGEON STRL SZ7.5 (GLOVE) ×5 IMPLANT
GLOVE BIOGEL PI IND STRL 7.0 (GLOVE) IMPLANT
GLOVE BIOGEL PI IND STRL 7.5 (GLOVE) IMPLANT
GLOVE BIOGEL PI IND STRL 8 (GLOVE) ×1 IMPLANT
GLOVE BIOGEL PI INDICATOR 7.0 (GLOVE) ×2
GLOVE BIOGEL PI INDICATOR 7.5 (GLOVE) ×2
GLOVE BIOGEL PI INDICATOR 8 (GLOVE) ×2
GLOVE ECLIPSE 6.5 STRL STRAW (GLOVE) ×2 IMPLANT
GLOVE SS BIOGEL STRL SZ 7 (GLOVE) IMPLANT
GLOVE SUPERSENSE BIOGEL SZ 7 (GLOVE) ×2
GLOVE SURG SS PI 7.0 STRL IVOR (GLOVE) ×2 IMPLANT
GOWN STRL REUS W/ TWL LRG LVL3 (GOWN DISPOSABLE) ×3 IMPLANT
GOWN STRL REUS W/ TWL XL LVL3 (GOWN DISPOSABLE) IMPLANT
GOWN STRL REUS W/TWL LRG LVL3 (GOWN DISPOSABLE) ×6
GOWN STRL REUS W/TWL XL LVL3 (GOWN DISPOSABLE) ×6
GRAFT PROPATEN THIN WALL 6X80 (Vascular Products) ×2 IMPLANT
KIT BASIN OR (CUSTOM PROCEDURE TRAY) ×3 IMPLANT
KIT ROOM TURNOVER OR (KITS) ×3 IMPLANT
MARKER GRAFT CORONARY BYPASS (MISCELLANEOUS) IMPLANT
NS IRRIG 1000ML POUR BTL (IV SOLUTION) ×6 IMPLANT
PACK PERIPHERAL VASCULAR (CUSTOM PROCEDURE TRAY) ×3 IMPLANT
PAD ARMBOARD 7.5X6 YLW CONV (MISCELLANEOUS) ×6 IMPLANT
PADDING CAST COTTON 6X4 STRL (CAST SUPPLIES) IMPLANT
SET COLLECT BLD 21X3/4 12 (NEEDLE) ×2 IMPLANT
SPONGE LAP 18X18 X RAY DECT (DISPOSABLE) ×2 IMPLANT
SPONGE SURGIFOAM ABS GEL 100 (HEMOSTASIS) ×2 IMPLANT
STAPLER VISISTAT (STAPLE) IMPLANT
STOPCOCK 4 WAY LG BORE MALE ST (IV SETS) ×2 IMPLANT
SUT ETHILON 3 0 PS 1 (SUTURE) IMPLANT
SUT PROLENE 5 0 C 1 24 (SUTURE) ×1 IMPLANT
SUT PROLENE 6 0 BV (SUTURE) ×11 IMPLANT
SUT PROLENE 7 0 BV 1 (SUTURE) IMPLANT
SUT SILK 2 0 FS (SUTURE) ×3 IMPLANT
SUT SILK 3 0 (SUTURE)
SUT SILK 3-0 18XBRD TIE 12 (SUTURE) IMPLANT
SUT VIC AB 2-0 CTB1 (SUTURE) ×6 IMPLANT
SUT VIC AB 3-0 SH 27 (SUTURE) ×6
SUT VIC AB 3-0 SH 27X BRD (SUTURE) ×2 IMPLANT
SUT VIC AB 4-0 PS2 27 (SUTURE) ×2 IMPLANT
SUT VICRYL 4-0 PS2 18IN ABS (SUTURE) ×6 IMPLANT
TOWEL OR 17X24 6PK STRL BLUE (TOWEL DISPOSABLE) ×4 IMPLANT
TRAY FOLEY CATH 16FRSI W/METER (SET/KITS/TRAYS/PACK) ×3 IMPLANT
TUBING EXTENTION W/L.L. (IV SETS) ×2 IMPLANT
UNDERPAD 30X30 INCONTINENT (UNDERPADS AND DIAPERS) ×3 IMPLANT
WATER STERILE IRR 1000ML POUR (IV SOLUTION) ×3 IMPLANT

## 2013-10-07 NOTE — Progress Notes (Signed)
Pt arrived to unit from PACU accompanied by PACU RN. Pt oriented to room, VS stable.

## 2013-10-07 NOTE — Op Note (Signed)
NAME: [name] [name]    MRN: [id number] DOB: 1945-04-17    DATE OF OPERATION: 10/07/2013  PREOP DIAGNOSIS: nonhealing wound left foot with infrainguinal arterial occlusive disease  POSTOP DIAGNOSIS: same  PROCEDURE:  1. Left common femoral artery to below knee popliteal artery with 6 mm PTFE 2. Endarterectomy left below knee popliteal artery 3. Intraoperative arteriogram  SURGEON: Judeth Cornfield. Scot Dock, MD, FACS  ASSIST: Gerri Lins PA  ANESTHESIA: Gen.   EBL: minimal  INDICATIONS: [name] [name] is a 68 y.o. female with a nonhealing wound of her left foot. She had an occlusion of her superficial femoral artery with reconstitution of the popliteal artery and tibial artery occlusive disease. She was unable to go endovascular revascularization and therefore it was felt that her only remaining option was bypass. Preoperative vein mapping showed that her saphenous vein was quite small and this is what I encountered at the time of surgery. For this reason I used a prosthetic bypass.  FINDINGS: completion arteriogram shows chronically occluded anterior tibial artery. There is 2 vessel runoff on the left via the posterior tibial and peroneal arteries with which had moderate disease. There was extensive disease in the popliteal artery which required endarterectomy in order to provide an adequate distal target to  TECHNIQUE: The patient was taken in the abdomen I received a general anesthetic. The left lower extremity was prepped and draped in the usual sterile fashion. I looked at the saphenous vein with the duplex scanner and it appeared to be quite small multiple branches. A vertical incision was made in the groin above the inguinal crease and dissection carried down to the common femoral artery which was controlled with a vessel loop. There was some circumferential plaque but there was a soft spot along the anterior lateral aspect of the artery. To provide an adequate area to clamp I had to  dissect above the inguinal ligament where the artery was softer and could be clamped. The superficial femoral artery and deep femoral artery were controlled with Vesseloops. Proximal saphenous vein appeared quite small. Next a longitudinal incision was made along the medial aspect of the left leg and the dissection carried down to the below-knee popliteal artery which had moderate disease. Of note the saphenous vein at this level was also quite small. I divided the anterior tibial vein between 2-0 silk ties to provide exposure of the proximal tibial peroneal trunk and proximal anterior tibial artery. A tunnel was then created between the 2 incisions the patient was heparinized with 8000 units of IV heparin. A 6 mm PTFE graft was tunneled between the 2 incisions. The common femoral artery was clamped proximally and the superficial femoral artery and deep femoral arteries were controlled. A longitudinal arteriotomy was made in the anterior lateral aspect of the distal left common femoral artery. The graft was spatulated and sewn end-to-side to the artery using continuous 6-0 Prolene suture. Prior to completing the anastomosis, the artery was backbled and flushed and there was excellent inflow. The anastomosis was completed.  Attention was then turned to the distal anastomosis. The graft the appropriate length. A tourniquet was placed on the upper thigh and the leg exsanguinated with an Esmarch bandage. The tourniquet was inflated to 300 mm of mercury. Under tourniquet control, a longitudinal arteriotomy was made in the below-knee pop 2 artery. There was extensive plaque which extended down into the tibial peroneal trunk and proximal anterior tibial artery. An endarterectomy was performed and I was able to retrieve plaque  proximally and also from the proximal anterior tibial artery. The plaque tapered in the tibial peroneal trunk and this was excised and then one tacking suture was placed distally. The PTFE graft to  the appropriate length, spatulated, and sewn end-to-side to the below knee popliteal artery using continuous 6-0 Prolene suture. Prior to completing this anastomosis, the tourniquet was released. The artery was backbled and flushed properly and the anastomosis completed. Flow was established the left foot. There was a good peroneal and posterior tibial signal the Doppler to  Graft cannulated proximally and an intraoperative arteriogram was obtained which showed no technical problems and two-vessel runoff via the peroneal and posterior tibial arteries. The heparin was partially reversed with protamine. The wounds were irrigated with saline and hemostasis obtained. The distal incision was closed with 2 deep layers of 3-0 Vicryl and the skin closed with 4-0 Vicryl. The groin incision was closed with a deep layer of 2-0 Vicryl, subcutaneous layer 3-0 Vicryl, and the skin closed with a 4-0 subcuticular stitch. Dermabond was applied. The patient tolerated the procedure well and was transferred to the recovery room in stable condition. All needle and sponge counts were correct.  Deitra Mayo, MD, FACS [facility]  DATE OF DICTATION:   10/07/2013

## 2013-10-07 NOTE — Transfer of Care (Signed)
Immediate Anesthesia Transfer of Care Note  Patient: [name] [name]  Procedure(s) Performed: Procedure(s): LEFT FEMORAL-POPLITEAL ARTERY BYPASS GRAFT (Left) INTRA OPERATIVE ARTERIOGRAM LEFT LEG (Left) LEFT POPLITEAL ENDARTERECTOMY  (Left)  Patient Location: PACU  Anesthesia Type:General  Level of Consciousness: awake, alert , oriented and patient cooperative  Airway & Oxygen Therapy: Patient Spontanous Breathing and Patient connected to nasal cannula oxygen  Post-op Assessment: Report given to PACU RN, Post -op Vital signs reviewed and stable and Patient moving all extremities  Post vital signs: Reviewed and stable  Complications: No apparent anesthesia complications

## 2013-10-07 NOTE — Progress Notes (Addendum)
  Day of Surgery Note  Agree with note below Brisk PT and PER signal with doppler Incisions look fine  Deitra Mayo, MD, FACS Beeper [phone] 10/07/2013   Subjective:  C/o soreness  Filed Vitals:   10/07/13 1145  BP: 157/43  Pulse: 55  Temp:   Resp: 15    Extremities:   + audible left peroneal/DP/PT signal Incisions:  Clean and dry and intact   Assessment/Plan:  This is a 68 y.o. female who is s/p  1. Left common femoral artery to below knee popliteal artery with 6 mm PTFE  2. Endarterectomy left below knee popliteal artery  3. Intraoperative arteriogram   -pt doing well this afternoon after surgery -bypass is patent -awaiting bed on [place], [place] 10/07/2013 12:20 PM

## 2013-10-07 NOTE — Anesthesia Procedure Notes (Signed)
Procedure Name: Intubation Date/Time: 10/07/2013 7:38 AM Performed by: Julian Reil Pre-anesthesia Checklist: Patient identified, Emergency Drugs available, Suction available and Patient being monitored Patient Re-evaluated:Patient Re-evaluated prior to inductionOxygen Delivery Method: Circle system utilized Preoxygenation: Pre-oxygenation with 100% oxygen Intubation Type: IV induction Ventilation: Mask ventilation without difficulty and Oral airway inserted - appropriate to patient size Laryngoscope Size: Mac and 3 Grade View: Grade I Tube type: Oral Tube size: 7.5 mm Number of attempts: 1 Airway Equipment and Method: Stylet Placement Confirmation: ETT inserted through vocal cords under direct vision,  positive ETCO2 and breath sounds checked- equal and bilateral Secured at: 21 cm Tube secured with: Tape Dental Injury: Teeth and Oropharynx as per pre-operative assessment

## 2013-10-07 NOTE — Progress Notes (Signed)
Patient has had high blood pressure 177/33, scheduled coreg was given but blood pressure stayed high. 10 mg hydralazine was given and blood pressure went down to 132/26. Will continue to monitor patient.

## 2013-10-07 NOTE — Anesthesia Preprocedure Evaluation (Signed)
Anesthesia Evaluation  Patient identified by MRN, date of birth, ID band Patient awake    Reviewed: Allergy & Precautions, H&P , NPO status , Patient's Chart, lab work & pertinent test results  History of Anesthesia Complications Negative for: history of anesthetic complications  Airway Mallampati: III TM Distance: >3 FB Neck ROM: Full    Dental  (+) Edentulous Upper, Edentulous Lower   Pulmonary former smoker,  breath sounds clear to auscultation        Cardiovascular hypertension, + CAD, + Past MI, + Peripheral Vascular Disease and +CHF Rhythm:Regular Rate:Normal     Neuro/Psych PSYCHIATRIC DISORDERS Depression TIA   GI/Hepatic negative GI ROS, Neg liver ROS,   Endo/Other  diabetes, Type 2, Insulin DependentMorbid obesity  Renal/GU negative Renal ROS     Musculoskeletal  (+) Arthritis -,   Abdominal   Peds  Hematology  (+) anemia ,   Anesthesia Other Findings   Reproductive/Obstetrics                           Anesthesia Physical Anesthesia Plan  ASA: III  Anesthesia Plan: General   Post-op Pain Management:    Induction: Intravenous  Airway Management Planned: Oral ETT  Additional Equipment: Arterial line  Intra-op Plan:   Post-operative Plan: Extubation in OR  Informed Consent: I have reviewed the patients History and Physical, chart, labs and discussed the procedure including the risks, benefits and alternatives for the proposed anesthesia with the patient or authorized representative who has indicated his/her understanding and acceptance.   Dental advisory given  Plan Discussed with: CRNA and Surgeon  Anesthesia Plan Comments:         Anesthesia Quick Evaluation

## 2013-10-07 NOTE — Progress Notes (Signed)
Utilization review completed

## 2013-10-07 NOTE — Anesthesia Postprocedure Evaluation (Signed)
  Anesthesia Post-op Note  Patient: [name] [name]  Procedure(s) Performed: Procedure(s): LEFT FEMORAL-POPLITEAL ARTERY BYPASS GRAFT (Left) INTRA OPERATIVE ARTERIOGRAM LEFT LEG (Left) LEFT POPLITEAL ENDARTERECTOMY  (Left)  Patient Location: PACU  Anesthesia Type:General  Level of Consciousness: awake, alert  and oriented  Airway and Oxygen Therapy: Patient Spontanous Breathing  Post-op Pain: mild  Post-op Assessment: Post-op Vital signs reviewed  Post-op Vital Signs: Reviewed  Last Vitals:  Filed Vitals:   10/07/13 1210  BP: 159/41  Pulse: 57  Temp:   Resp: 9    Complications: No apparent anesthesia complications

## 2013-10-08 ENCOUNTER — Inpatient Hospital Stay (HOSPITAL_COMMUNITY): Payer: Medicare Other

## 2013-10-08 ENCOUNTER — Encounter (HOSPITAL_COMMUNITY): Payer: Self-pay | Admitting: Vascular Surgery

## 2013-10-08 DIAGNOSIS — Z48812 Encounter for surgical aftercare following surgery on the circulatory system: Secondary | ICD-10-CM

## 2013-10-08 LAB — BASIC METABOLIC PANEL
ANION GAP: 9 (ref 5–15)
BUN: 23 mg/dL (ref 6–23)
CALCIUM: 8 mg/dL — AB (ref 8.4–10.5)
CO2: 25 mEq/L (ref 19–32)
CREATININE: 0.96 mg/dL (ref 0.50–1.10)
Chloride: 105 mEq/L (ref 96–112)
GFR calc Af Amer: 70 mL/min — ABNORMAL LOW (ref >90)
GFR calc non Af Amer: 60 mL/min — ABNORMAL LOW (ref >90)
Glucose, Bld: 118 mg/dL — ABNORMAL HIGH (ref 70–99)
Potassium: 4.1 mEq/L (ref 3.7–5.3)
Sodium: 139 mEq/L (ref 137–147)

## 2013-10-08 LAB — GLUCOSE, CAPILLARY
GLUCOSE-CAPILLARY: 129 mg/dL — AB (ref 70–99)
GLUCOSE-CAPILLARY: 184 mg/dL — AB (ref 70–99)
GLUCOSE-CAPILLARY: 89 mg/dL (ref 70–99)
Glucose-Capillary: 151 mg/dL — ABNORMAL HIGH (ref 70–99)
Glucose-Capillary: 81 mg/dL (ref 70–99)

## 2013-10-08 LAB — CBC
HCT: 28.3 % — ABNORMAL LOW (ref 36.0–46.0)
Hemoglobin: 8.9 g/dL — ABNORMAL LOW (ref 12.0–15.0)
MCH: 27.6 pg (ref 26.0–34.0)
MCHC: 31.4 g/dL (ref 30.0–36.0)
MCV: 87.6 fL (ref 78.0–100.0)
PLATELETS: 159 10*3/uL (ref 150–400)
RBC: 3.23 MIL/uL — ABNORMAL LOW (ref 3.87–5.11)
RDW: 15.7 % — ABNORMAL HIGH (ref 11.5–15.5)
WBC: 8.3 10*3/uL (ref 4.0–10.5)

## 2013-10-08 NOTE — Evaluation (Signed)
Reviewed and agree with assessment and POC.  Alben Deeds, [place] DPT  [phone]

## 2013-10-08 NOTE — Progress Notes (Signed)
Pt continues to have nausea, no vomiting. Not taking po well. Has voided 250 cc since catheter removed. IV fluids resumed.

## 2013-10-08 NOTE — Progress Notes (Signed)
Received orders to transfer pt to 2W, pt aware of transfer, VS stable. Report called to RN on 2W. No s/s of acute distress noted.

## 2013-10-08 NOTE — Evaluation (Signed)
Physical Therapy Evaluation Patient Details Name: [name] [name] MRN: [id number] DOB: [birth year] Today's Date: 10/08/2013   History of Present Illness  Pt is a 68 y.o. female presenting s/p L common femoral artery to below knee popliteal artery bypass, endarterectomy left below knee popliteal artery and intraoperative arteriogram. hx of obesity, limb ischemia, HTN, DM, hyperlipidemia, stroke, B carotid endartectomies, and nonhealing ulcer L plantar surface for 6 months.   Clinical Impression  Pt currently requiring mod A for transfers with RW and min guard with RW for ambulating short distances. Pt current functional ability limited by pain. Pt expecting to d/c to son's home and reports having 24hr supervision/assistance available after initial d/c. Pt would benefit from skilled PT services in order to improve current functional status and reduce reliance on caregivers at initial d/c. Recommend d/c home with 24hr supervision/assist and HHPT.     Follow Up Recommendations Home health PT;Supervision/Assistance - 24 hour    Equipment Recommendations  None recommended by PT    Recommendations for Other Services       Precautions / Restrictions Precautions Precautions: Fall Restrictions Weight Bearing Restrictions: No      Mobility  Bed Mobility               General bed mobility comments: pt received in chair  Transfers Overall transfer level: Needs assistance Equipment used: Rolling walker (2 wheeled) Transfers: Sit to/from Stand Sit to Stand: Mod assist         General transfer comment: pt require assistance to power up from chair  Ambulation/Gait Ambulation/Gait assistance: Min guard Ambulation Distance (Feet): 6 Feet Assistive device: Rolling walker (2 wheeled) Gait Pattern/deviations: Step-to pattern;Decreased stride length;Antalgic;Decreased weight shift to left Gait velocity: slow Gait velocity interpretation: Below normal speed for age/gender General Gait  Details: limited trunk rotation. reduced weight shift to L. No cueing required for use of RW. Guarding for safety, pt not demonstrating instability.   Stairs            Wheelchair Mobility    Modified Rankin (Stroke Patients Only)       Balance Overall balance assessment: Needs assistance Sitting-balance support: Feet supported Sitting balance-Leahy Scale: Good Sitting balance - Comments: pt able to weight shift in sitting without UE support   Standing balance support: During functional activity;Bilateral upper extremity supported Standing balance-Leahy Scale: Fair                               Pertinent Vitals/Pain Pain Assessment: 0-10 Pain Score: 9  Pain Location: L LE Pain Descriptors / Indicators: Aching Pain Intervention(s): Limited activity within patient's tolerance;Patient requesting pain meds-RN notified;Repositioned    Home Living Family/patient expects to be discharged to:: Private residence Living Arrangements: Children (pt lives alone, but will be staying with son after d/c) Available Help at Discharge: Family;Available 24 hours/day Type of Home: House Home Access: Stairs to enter Entrance Stairs-Rails: None (wall on one side pt can hold on to) Entrance Stairs-Number of Steps: 4 Home Layout: One level (3 steps to living room with rail) Home Equipment: Walker - 2 wheels;Walker - 4 wheels;Cane - single point;Shower seat;Bedside commode Additional Comments: Will be staying with son and his family for first couple of days after d/c. Someone will be available to assist/supervise 24hr/day. Pt's lives in a 2 story apt, but is able to live on main level.      Prior Function Level of Independence: Needs assistance  Gait / Transfers Assistance Needed: use of assistive device as needed  ADL's / Homemaking Assistance Needed: Pt was primarily staying in her chair with leg elevated for several months and only walking short distances. She had difficulty  with LB bathing and dressing and is interestee in AE.  Comments: pt states she only uses her cane at times and uses her rollator when doing the laundry or taking out the trash. Pt reports having home health aid coming to assist her with ADLs. Pt does not drive.      Hand Dominance   Dominant Hand: Right    Extremity/Trunk Assessment   Upper Extremity Assessment: Overall WFL for tasks assessed           Lower Extremity Assessment: Defer to PT evaluation   LLE Deficits / Details: able to tolerate weightbearing, but painful     Communication   Communication: No difficulties  Cognition Arousal/Alertness: Awake/alert Behavior During Therapy: WFL for tasks assessed/performed Overall Cognitive Status: Within Functional Limits for tasks assessed                      General Comments General comments (skin integrity, edema, etc.): during ambulation, pt with drop in O2 sat levels to low to mid 80%, but no signs/sx of desat. pt report nausea prior to start of eval, NSG notified.     Exercises        Assessment/Plan    PT Assessment Patient needs continued PT services  PT Diagnosis Difficulty walking;Acute pain   PT Problem List Decreased strength;Decreased activity tolerance;Decreased balance;Decreased mobility;Pain  PT Treatment Interventions Gait training;DME instruction;Stair training;Functional mobility training;Therapeutic activities;Therapeutic exercise;Balance training;Patient/family education   PT Goals (Current goals can be found in the Care Plan section) Acute Rehab PT Goals Patient Stated Goal: to go home PT Goal Formulation: With patient Time For Goal Achievement: 10/22/13 Potential to Achieve Goals: Good    Frequency Min 3X/week   Barriers to discharge        Co-evaluation               End of Session Equipment Utilized During Treatment: Gait belt Activity Tolerance: Patient limited by pain Patient left: in chair;with call bell/phone  within reach (in w/c awaiting transport to new unit) Nurse Communication: Mobility status (pt nauseated)         Time: 1740-8144 PT Time Calculation (min): 18 min   Charges:         PT G Codes:          Vinh Sachs 10/08/2013, 1:49 PM Levonne Hubert, SPT

## 2013-10-08 NOTE — Progress Notes (Addendum)
Agree with note below. Incisions look fine Brisk PT and PER signal with doppler Tx 2W  XRAY Left foot: No radiographic evidence of osteomyelitis.  Anticipate D/C tomorrow  Deitra Mayo, MD, FACS Beeper [phone] 10/08/2013   Subjective  - Nausea this am.  Otherwise she is doing well.   Objective 130/39 54 98 F (36.7 C) (Oral) 15 97%  Intake/Output Summary (Last 24 hours) at 10/08/13 0734 Last data filed at 10/08/13 0700  Gross per 24 hour  Intake 2677.5 ml  Output   1750 ml  Net  927.5 ml    Doppler biphasic Peroneal/PT/DP left foot Incisions C/D/I 4 x 4 guaze placed in groin Lungs CTA Heart RRR  Assessment/Planning: POD #1 PROCEDURE:  1. Left common femoral artery to below knee popliteal artery with 6 mm PTFE  2. Endarterectomy left below knee popliteal artery  3. Intraoperative arteriogram Nausea this am.  She was given Zofran.  She is in stable condition and will be transferred to 2W.   PT for eval and mobility   Laurence Slate [facility] 10/08/2013 7:34 AM --  Laboratory Lab Results:  Recent Labs  10/07/13 0344 10/08/13 0450  WBC 7.1 8.3  HGB 10.5* 8.9*  HCT 32.7* 28.3*  PLT 175 159   BMET  Recent Labs  10/07/13 0344 10/08/13 0450  NA 140 139  K 4.5 4.1  CL 104 105  CO2 26 25  GLUCOSE 111* 118*  BUN 26* 23  CREATININE 1.05 0.96  CALCIUM 8.6 8.0*    COAG Lab Results  Component Value Date   INR 1.15 10/06/2013   INR 1.13 09/08/2013   INR 1.02 03/27/2013   No results found for this basename: PTT

## 2013-10-08 NOTE — Evaluation (Signed)
Occupational Therapy Evaluation Patient Details Name: [name] [name] MRN: [id number] DOB: [birth year] Today's Date: 10/08/2013    History of Present Illness Pt is a 68 y.o. female presenting s/p L common femoral artery to below knee popliteal artery bypass, endarterectomy left below knee popliteal artery and intraoperative arteriogram. hx of obesity, limb ischemia, HTN, DM, hyperlipidemia, stroke, B carotid endartectomies, and nonhealing ulcer L plantar surface for 6 months.    Clinical Impression   Pt was fairly sedentary prior to admission due to LE pain and need to keep L LE elevated.  Pt with longstanding difficulty with LB ADL. She presents with 9/10 L LE pain and difficulty weightbearing interfering with balance for standing ADL and transfers.  Pt is motivated to increase her mobility and independence.  Will follow acutely to educate in use of AE and to address standing tolerance and ADL transfers in preparation for pt to d/c home with her son.    Follow Up Recommendations  No OT follow up;Supervision/Assistance - 24 hour    Equipment Recommendations  None recommended by OT    Recommendations for Other Services       Precautions / Restrictions Precautions Precautions: Fall Restrictions Weight Bearing Restrictions: No      Mobility Bed Mobility               General bed mobility comments: pt received in chair  Transfers Overall transfer level: Needs assistance Equipment used: Rolling walker (2 wheeled) Transfers: Sit to/from Stand Sit to Stand: Mod assist         General transfer comment: pt require assistance to power up from chair    Balance Overall balance assessment: Needs assistance Sitting-balance support: Feet supported Sitting balance-Leahy Scale: Good Sitting balance - Comments: pt able to weight shift in sitting without UE support   Standing balance support: During functional activity;Bilateral upper extremity supported Standing balance-Leahy  Scale: Fair                              ADL Overall ADL's : Needs assistance/impaired Eating/Feeding: Independent;Bed level   Grooming: Wash/dry hands;Wash/dry face;Oral care;Set up;Sitting   Upper Body Bathing: Set up;Sitting   Lower Body Bathing: Maximal assistance;Sit to/from stand   Upper Body Dressing : Set up;Sitting   Lower Body Dressing: Maximal assistance;Sit to/from stand   Toilet Transfer: Minimal assistance;Stand-pivot;BSC   Toileting- Clothing Manipulation and Hygiene: Minimal assistance;Sit to/from stand       Functional mobility during ADLs: Min guard;Rolling walker General ADL Comments: Pt with decreased activity tolerance at baseline.  Has home health aide that was assisting for IADL and LB ADL.      Vision                     Perception     Praxis      Pertinent Vitals/Pain Pain Assessment: 0-10 Pain Score: 9  Pain Location: L LE Pain Descriptors / Indicators: Aching Pain Intervention(s): Limited activity within patient's tolerance;Patient requesting pain meds-RN notified;Repositioned     Hand Dominance Right   Extremity/Trunk Assessment Upper Extremity Assessment Upper Extremity Assessment: Overall WFL for tasks assessed   Lower Extremity Assessment Lower Extremity Assessment: Defer to PT evaluation LLE Deficits / Details: able to tolerate weightbearing, but painful LLE: Unable to fully assess due to pain       Communication Communication Communication: No difficulties   Cognition Arousal/Alertness: Awake/alert Behavior During Therapy: WFL for tasks assessed/performed Overall  Cognitive Status: Within Functional Limits for tasks assessed                     General Comments       Exercises       Shoulder Instructions      Home Living Family/patient expects to be discharged to:: Private residence Living Arrangements: Children (pt lives alone, but will be staying with son after d/c) Available Help  at Discharge: Family;Available 24 hours/day;Personal care attendant Type of Home: House Home Access: Stairs to enter [employer] of Steps: 4 Entrance Stairs-Rails: None (wall on one side pt can hold on to) Home Layout: One level (3 steps to living room with rail)     Bathroom Shower/Tub: Tub/shower unit;Walk-in shower   Bathroom Toilet: Standard     Home Equipment: Environmental consultant - 2 wheels;Walker - 4 wheels;Cane - single point;Shower seat;Bedside commode Adaptive Equipment: Long-handled shoe horn Additional Comments: Will be staying with son and his family for first couple of days after d/c. Someone will be available to assist/supervise 24hr/day. Pt's lives in an 2 story home, but is able to live on main level.        Prior Functioning/Environment Level of Independence: Needs assistance  Gait / Transfers Assistance Needed: use of assistive device as needed ADL's / Homemaking Assistance Needed: Pt was primarily staying in her chair with leg elevated for several months and only walking short distances. She had difficulty with LB bathing and dressing and is interested in AE.   Comments: pt states she only uses her cane at times and uses her rollator when doing the laundry or taking out the trash. Pt reports having home health aid coming to assist her with ADLs. Pt does not drive.     OT Diagnosis: Generalized weakness;Acute pain   OT Problem List: Decreased strength;Decreased activity tolerance;Impaired balance (sitting and/or standing);Decreased knowledge of use of DME or AE;Pain;Obesity;Cardiopulmonary status limiting activity   OT Treatment/Interventions: Self-care/ADL training;DME and/or AE instruction;Therapeutic activities;Patient/family education;Balance training    OT Goals(Current goals can be found in the care plan section) Acute Rehab OT Goals Patient Stated Goal: to go home OT Goal Formulation: With patient Time For Goal Achievement: 10/15/13 Potential to Achieve  Goals: Good ADL Goals Pt Will Perform Grooming: with supervision;standing Pt Will Perform Lower Body Bathing: with supervision;with adaptive equipment;sit to/from stand Pt Will Perform Lower Body Dressing: with supervision;with adaptive equipment;sit to/from stand Pt Will Transfer to Toilet: with supervision;ambulating;bedside commode (3 in 1 over toilet) Pt Will Perform Toileting - Clothing Manipulation and hygiene: with supervision;sit to/from stand Pt Will Perform Tub/Shower Transfer: Tub transfer;with min guard assist;shower seat;ambulating;rolling walker  OT Frequency: Min 2X/week   Barriers to D/C:            Co-evaluation              End of Session Equipment Utilized During Treatment: Gait belt;Rolling walker Nurse Communication: Patient requests pain meds  Activity Tolerance: Patient limited by pain Patient left: in bed;with call bell/phone within reach   Time: 1120-1135 OT Time Calculation (min): 15 min Charges:  OT General Charges $OT Visit: 1 Procedure OT Evaluation $Initial OT Evaluation Tier I: 1 Procedure OT Treatments $Self Care/Home Management : 8-22 mins G-Codes:    Malka So 10/08/2013, 2:14 PM [phone]

## 2013-10-08 NOTE — Progress Notes (Signed)
VASCULAR LAB PRELIMINARY  ARTERIAL  ABI completed:    RIGHT    LEFT    PRESSURE WAVEFORM  PRESSURE WAVEFORM  BRACHIAL 186 Triphasic  BRACHIAL 184 Triphasic   DP   DP    AT 84 Monophasic  AT 170 Monophasic   PT 71 Monophasic  PT 142 Monophasic   PER   PER    GREAT TOE  NA GREAT TOE  NA    RIGHT LEFT  ABI 0.45 0.91     Finnean Cerami, RVT 10/08/2013, 12:59 PM

## 2013-10-09 ENCOUNTER — Telehealth: Payer: Self-pay | Admitting: Vascular Surgery

## 2013-10-09 DIAGNOSIS — I5042 Chronic combined systolic (congestive) and diastolic (congestive) heart failure: Secondary | ICD-10-CM

## 2013-10-09 DIAGNOSIS — I2510 Atherosclerotic heart disease of native coronary artery without angina pectoris: Secondary | ICD-10-CM

## 2013-10-09 DIAGNOSIS — I739 Peripheral vascular disease, unspecified: Secondary | ICD-10-CM

## 2013-10-09 LAB — GLUCOSE, CAPILLARY
GLUCOSE-CAPILLARY: 58 mg/dL — AB (ref 70–99)
GLUCOSE-CAPILLARY: 121 mg/dL — AB (ref 70–99)
Glucose-Capillary: 76 mg/dL (ref 70–99)
Glucose-Capillary: 113 mg/dL — ABNORMAL HIGH (ref 70–99)
Glucose-Capillary: 131 mg/dL — ABNORMAL HIGH (ref 70–99)

## 2013-10-09 MED ORDER — OXYCODONE-ACETAMINOPHEN 5-325 MG PO TABS
1.0000 | ORAL_TABLET | ORAL | Status: DC | PRN
  Administered 2013-10-09: 1 via ORAL
  Filled 2013-10-09: qty 1

## 2013-10-09 MED ORDER — MUPIROCIN 2 % EX OINT
1.0000 "application " | TOPICAL_OINTMENT | Freq: Two times a day (BID) | CUTANEOUS | Status: DC
  Administered 2013-10-09 – 2013-10-10 (×2): 1 via NASAL
  Filled 2013-10-09: qty 22

## 2013-10-09 MED ORDER — CHLORHEXIDINE GLUCONATE CLOTH 2 % EX PADS
6.0000 | MEDICATED_PAD | Freq: Every day | CUTANEOUS | Status: DC
  Administered 2013-10-09 – 2013-10-10 (×2): 6 via TOPICAL

## 2013-10-09 NOTE — Care Management Note (Addendum)
Page 1 of 2   10/10/2013     5:12:10 PM CARE MANAGEMENT NOTE 10/10/2013  Patient:  [name] [name], [name] [name]   Account Number:  [id number]  Date Initiated:  10/09/2013  Documentation initiated by:  Ahmari Garton  Subjective/Objective Assessment:   Pt adm on 10/06/13 s/p Lt fem pop.  PTA, pt resides at home with son.  She is active with [facility] for [facility].     Action/Plan:   PT/OT recommending HH with 24hr supervision.  Pt states son, dtr and grand-dtr will provide 24h care at dc.  She has all DME needed at home.   Anticipated DC Date:  10/10/2013   Anticipated DC Plan:  [place]  CM consult      [facility] Choice  Resumption Of Svcs/PTA Provider   Choice offered to / List presented to:  C-1 Patient        [facility] arranged  HH-1 RN  [place].   Status of service:  Completed, signed off Medicare Important Message given?  YES (If response is "NO", the following Medicare IM given date fields will be blank) Date Medicare IM given:  10/09/2013 Medicare IM given by:  Aylla Huffine Date Additional Medicare IM given:   Additional Medicare IM given by:    Discharge Disposition:  [place]  Per UR Regulation:  Reviewed for med. necessity/level of care/duration of stay  If discussed at [place] of Stay Meetings, dates discussed:    Comments:  10/10/13 Ellan Lambert, RN, BSN [phone] Pt dc home today; [facility] notified of dc home today.  10/09/13 Ellan Lambert, RN, BSN [phone] [place] dc home on 10/10/13.  Referral to [facility], per pt choice. Start of care 24-48h post dc date.

## 2013-10-09 NOTE — Progress Notes (Signed)
Occupational Therapy Treatment Patient Details Name: [name] [name] MRN: [id number] DOB: 1945-04-25 Today's Date: 10/09/2013    History of present illness Pt is a 68 y.o. female presenting s/p L common femoral artery to below knee popliteal artery bypass, endarterectomy left below knee popliteal artery and intraoperative arteriogram. hx of obesity, limb ischemia, HTN, DM, hyperlipidemia, stroke, B carotid endartectomies, and nonhealing ulcer L plantar surface for 6 months.    OT comments  [name] making progress; however still not wanting to put much weight on LLE due to pain; thus she will need more A at home then it appears she had before (and not sure family is able or knows that they need to do this--I left the CM a note about this to make sure the family is aware). Recommending that family help pt anytime she is up on her feet due to balance issues and pain--even just to get up to [name] [name]. Will continue to follow.  Follow Up Recommendations  Home health OT;Supervision/Assistance - 24 hour (currently she will need someone with her anytime she is up on her feet (due to balance and safety))          Precautions / Restrictions Precautions Precautions: Fall Restrictions Weight Bearing Restrictions: No       Mobility Bed Mobility Overal bed mobility: Needs Assistance Bed Mobility: Supine to Sit     Supine to sit: Modified independent (Device/Increase time);HOB elevated (and use of rail)        Transfers Overall transfer level: Needs assistance Equipment used: Rolling walker (2 wheeled) Transfers: Sit to/from Stand Sit to Stand: Min assist (with increased time and cues to lean forward more)              Balance Overall balance assessment: Needs assistance Sitting-balance support: Feet supported;No upper extremity supported Sitting balance-Leahy Scale: Good     Standing balance support: Bilateral upper extremity supported Standing balance-Leahy Scale: Poor                      ADL                       Lower Body Dressing: Minimal assistance;With adaptive equipment;Sit to/from stand Lower Body Dressing Details (indicate cue type and reason): Pt reports she has a reacher at home. Pt used sock aid on RLE; however I did not have a wide sock aid with me for her to try on her LLE (she said normally she has a dressing on her LLE and so she wears a sock on this foot normally), with a dressing on this foot the standard sock aid will not work. No dressing currently on the patients LLE (called nursing and she-Keisha--said the patient does not have an order for such) Toilet Transfer: Minimal assistance;Stand-pivot;BSC   Toileting- Clothing Manipulation and Hygiene: Minimal assistance;Sit to/from stand                          Cognition   Behavior During Therapy: [facility] for tasks assessed/performed Overall Cognitive Status: Within Functional Limits for tasks assessed                                    Pertinent Vitals/ Pain       Pain Assessment: 0-10 Pain Score: 7  Pain Location: LLE Pain Intervention(s): Monitored during session;Repositioned  Frequency Min 2X/week     Progress Toward Goals  OT Goals(current goals can now be found in the care plan section)  Progress towards OT goals: Progressing toward goals     Plan Discharge plan needs to be updated       End of Session Equipment Utilized During Treatment: Rolling walker   Activity Tolerance Patient tolerated treatment well   Patient Left in chair;with call bell/phone within reach   Nurse Communication  (questioned about dressing for LLE due to patient reporting that normally at home she has a HHRN come in and do a dressing change on the wound on the bottom of her LLE--currently no dressing on foot. Per RN: no order for LLE dressing for bottom of foot)        Time: 7614-7092 OT Time Calculation (min): 35 min  Charges: OT General  Charges $OT Visit: 1 Procedure OT Treatments $Self Care/Home Management : 23-37 mins  [name] [name] [phone] 10/09/2013, 12:45 PM

## 2013-10-09 NOTE — Progress Notes (Signed)
Agree with the note and plans as outlined.

## 2013-10-09 NOTE — Progress Notes (Addendum)
Agree with note below. Incisions look fine. Her nausea is improved. Her postop ABI is 91% on the left. Her foot x-ray showed no evidence of osteomyelitis. She should be ready for discharge in the morning. She has an appointment for follow up on 10/15/13,  but this should be changed to 11/05/13. I have notified the office of this.   Deitra Mayo, MD, FACS Beeper [phone] 10/09/2013   Subjective  - "My left leg feels good, I'm still a little nauseated."   Objective 163/42 54 98.9 F (37.2 C) (Oral) 19 96%  Intake/Output Summary (Last 24 hours) at 10/09/13 0743 Last data filed at 10/08/13 1800  Gross per 24 hour  Intake      0 ml  Output    250 ml  Net   -250 ml    Left leg and groin incisions clean dry. Left groin without hematoma Left foot warm plantar wound no change  Assessment/Planning: POD #2 PROCEDURE:  1. Left common femoral artery to below knee popliteal artery with 6 mm PTFE  2. Endarterectomy left below knee popliteal artery  3. Intraoperative arteriogram Nausea still present.  PT/OT eval was completed yesterday.  Patient needs to increased her mobility. Possible discharge later today or tomorrow depending on her progress with the nausea and mobility.   Laurence Slate [facility] 10/09/2013 7:43 AM --  Laboratory Lab Results:  Recent Labs  10/07/13 0344 10/08/13 0450  WBC 7.1 8.3  HGB 10.5* 8.9*  HCT 32.7* 28.3*  PLT 175 159   BMET  Recent Labs  10/07/13 0344 10/08/13 0450  NA 140 139  K 4.5 4.1  CL 104 105  CO2 26 25  GLUCOSE 111* 118*  BUN 26* 23  CREATININE 1.05 0.96  CALCIUM 8.6 8.0*    COAG Lab Results  Component Value Date   INR 1.15 10/06/2013   INR 1.13 09/08/2013   INR 1.02 03/27/2013   No results found for this basename: PTT

## 2013-10-09 NOTE — Progress Notes (Signed)
Patient Profile: 68 y/o female with CAD s/p CABG x4 with recent DES of SVG to diag 03/2013, ischemic cardiomyopathy EF 30%, and PVD with a nonhealing wound on the left foot subsequent to a long segment superficial femoral artery occlusion and was not a candidate for endovascular therapies. Day 1 s/p femoropopliteal bypasss.  Subjective: Only current complaint is pain along incision site. She denies resting chest pain and dyspnea, but reports chest pain and SOB with exertion. Pressure-like substernal pain. Pt reports symptoms occur frequently with exertion.   Objective: Vital signs in last 24 hours: Temp:  [97.8 F (36.6 C)-99.3 F (37.4 C)] 98.9 F (37.2 C) (10/01 0510) Pulse Rate:  [53-65] 54 (10/01 0510) Resp:  [14-19] 19 (10/01 0510) BP: (125-163)/(39-85) 163/42 mmHg (10/01 0510) SpO2:  [96 %-100 %] 96 % (10/01 0510) Last BM Date: 10/06/13  Intake/Output from previous day: 09/30 0701 - 10/01 0700 In: -  Out: 250 [Urine:250] Intake/Output this shift:    Medications Current Facility-Administered Medications  Medication Dose Route Frequency Provider Last Rate Last Dose  . 0.9 %  sodium chloride infusion   Intravenous Continuous Ulyses Amor, PA-C 75 mL/hr at 10/08/13 1820    . acetaminophen (TYLENOL) tablet 325-650 mg  325-650 mg Oral Q4H PRN Ulyses Amor, PA-C       Or  . acetaminophen (TYLENOL) suppository 325-650 mg  325-650 mg Rectal Q4H PRN Ulyses Amor, PA-C      . alum & mag hydroxide-simeth (MAALOX/MYLANTA) 200-200-20 MG/5ML suspension 15-30 mL  15-30 mL Oral Q2H PRN Ulyses Amor, PA-C      . amLODipine (NORVASC) tablet 10 mg  10 mg Oral Daily [name], MD   10 mg at 10/08/13 1301  . antiseptic oral rinse (CPC / CETYLPYRIDINIUM CHLORIDE 0.05%) solution 7 mL  7 mL Mouth Rinse BID [name], MD   7 mL at 10/08/13 2224  . aspirin EC tablet 325 mg  325 mg Oral Daily [name], MD   325 mg at 10/08/13 1434  . atorvastatin (LIPITOR) tablet 40 mg   40 mg Oral q1800 [name], MD   40 mg at 10/08/13 1758  . carvedilol (COREG) tablet 18.75 mg  18.75 mg Oral BID [name], MD   18.75 mg at 10/08/13 2224  . citalopram (CELEXA) tablet 20 mg  20 mg Oral Daily [name], MD   20 mg at 10/08/13 1301  . clopidogrel (PLAVIX) tablet 75 mg  75 mg Oral Q breakfast [name], MD   75 mg at 10/08/13 1301  . docusate sodium (COLACE) capsule 100 mg  100 mg Oral Daily Ulyses Amor, PA-C   100 mg at 10/08/13 1307  . enoxaparin (LOVENOX) injection 30 mg  30 mg Subcutaneous Q24H Ulyses Amor, PA-C   30 mg at 10/09/13 8421  . furosemide (LASIX) tablet 60 mg  60 mg Oral q morning - 10a [name], MD   60 mg at 10/08/13 1300  . glimepiride (AMARYL) tablet 2 mg  2 mg Oral BID [name], MD   2 mg at 10/08/13 2224  . guaiFENesin-dextromethorphan (ROBITUSSIN DM) 100-10 MG/5ML syrup 15 mL  15 mL Oral Q4H PRN Ulyses Amor, PA-C      . hydrALAZINE (APRESOLINE) injection 10 mg  10 mg Intravenous Q2H PRN Ulyses Amor, PA-C   10 mg at 10/07/13 1356  . HYDROcodone-acetaminophen (NORCO/VICODIN) 5-325 MG per tablet 1 tablet  1 tablet Oral Q6H PRN [name], MD   1 tablet at 10/08/13 2102  . Influenza vac split quadrivalent PF (FLUARIX) injection 0.5 mL  0.5 mL Intramuscular Tomorrow-1000 [name], MD      . insulin aspart (novoLOG) injection 0-15 Units  0-15 Units Subcutaneous TID [facility] Jake Church Masters, [facility]   3 Units at 10/08/13 1757  . insulin detemir (LEVEMIR) injection 36 Units  36 Units Subcutaneous QHS [name], MD   36 Units at 10/08/13 2224  . isosorbide mononitrate (IMDUR) 24 hr tablet 30 mg  30 mg Oral Daily [name], MD   30 mg at 10/08/13 1302  . labetalol (NORMODYNE,TRANDATE) injection 10 mg  10 mg Intravenous Q2H PRN Ulyses Amor, PA-C      . lisinopril (PRINIVIL,ZESTRIL) tablet 20 mg  20 mg Oral Daily [name], MD   20 mg at 10/08/13 1303  . magnesium sulfate IVPB 2 g 50 mL  2  g Intravenous Daily PRN Ulyses Amor, PA-C      . metoprolol (LOPRESSOR) injection 2-5 mg  2-5 mg Intravenous Q2H PRN Ulyses Amor, PA-C      . morphine 2 MG/ML injection 2-5 mg  2-5 mg Intravenous Q1H PRN Ulyses Amor, PA-C   2 mg at 10/08/13 1750  . multivitamin with minerals tablet 1 tablet  1 tablet Oral Daily [name], MD   1 tablet at 10/08/13 1303  . nitroGLYCERIN (NITROSTAT) SL tablet 0.4 mg  0.4 mg Sublingual Q5 min PRN [name], MD      . ondansetron [facility]) injection 4 mg  4 mg Intravenous Q6H PRN [name], MD   4 mg at 10/08/13 1750  . ondansetron (ZOFRAN) injection 4 mg  4 mg Intravenous Q6H PRN Eileen Stanford, PA-C   4 mg at 10/08/13 1219  . oxyCODONE-acetaminophen (PERCOCET/ROXICET) 5-325 MG per tablet 1-2 tablet  1-2 tablet Oral Q4H PRN Ulyses Amor, PA-C      . pantoprazole (PROTONIX) EC tablet 40 mg  40 mg Oral Daily Ulyses Amor, PA-C   40 mg at 10/08/13 1102  . phenol (CHLORASEPTIC) mouth spray 1 spray  1 spray Mouth/Throat PRN Ulyses Amor, PA-C      . potassium chloride (K-DUR) CR tablet 10 mEq  10 mEq Oral Daily [name], MD   10 mEq at 10/08/13 1303  . potassium chloride SA (K-DUR,KLOR-CON) CR tablet 20-40 mEq  20-40 mEq Oral Daily PRN Ulyses Amor, PA-C        PE: General appearance: alert, cooperative and no distress Lungs: faint crackles at the left lung base Heart: regular rate and rhythm Extremities: trace edema Pulses: 2+ and symmetric Skin: warm and dry Neurologic: Grossly normal  Lab Results:   Recent Labs  10/06/13 0911 10/07/13 0344 10/08/13 0450  WBC 6.5 7.1 8.3  HGB 11.2* 10.5* 8.9*  HCT 34.0* 32.7* 28.3*  PLT 198 175 159   BMET  Recent Labs  10/06/13 0911 10/07/13 0344 10/08/13 0450  NA 137 140 139  K 4.4 4.5 4.1  CL 100 104 105  CO2 _0 GLUCOSE 73 111* 118*  BUN 30* 26* 23  CREATININE 0.97 1.05 0.96  CALCIUM 9.1 8.6 8.0*   PT/INR  Recent Labs  10/06/13 0911  LABPROT  14.7  INR 1.15     Assessment/Plan  Active Problems:   PAD (peripheral artery disease)   Critical  lower limb ischemia  1. PVD w/ critical lower limb ischemia: Day 1 s/p femoropopliteal bypass of the left lower extremity. Continue post-op management per Vascular Surgery.   2. CAD: currently CP free. Sinus brady on telemetry with rate in the 50s but tolerating ok. BP moderately elevated. She has a h/o CABG in 2004 with recent stenting of her SVG to diag utilizing a DES 03/2013. EF 30% at that time. She denies resting CP but reports substernal pressure and dyspnea with exertion. Symptoms occur frequently but not at rest. She may require further titration of her antianginals. There is room with BP. No change in BB given bradycardia. Can increase Imdur to 60 mg. Continue medical therapy ASA, Plavix, Coreg, lisinopril, Imdur and amlodipine. She will need close OP f/u.   3. Chronic combined systolic + diastolic CHF: Last known EF 30% 03/2013. Mild basilar rales on the left. Continue Lasix. Monitor fluid status. Along with diuretic, continue BB and ACE.    LOS: 3 days    Brittainy M. Rosita Fire, PA-C 10/09/2013 7:56 AM

## 2013-10-09 NOTE — Progress Notes (Signed)
HR 57, coreg not given, unable to get in contact with PA Simmions. Kathleen Argue S 11:59 AM

## 2013-10-09 NOTE — Progress Notes (Signed)
Hypoglycemic Event  CBG:58  Treatment:1 cup orange juice  Symptoms:none  Follow-up CBG: [id number] CBG Result:76  Possible Reasons for Event:not eating well  Comments/MD notified:no    Netta Corrigan, Summerlyn Fickel [place]  Remember to initiate Hypoglycemia Order Set & complete

## 2013-10-09 NOTE — Progress Notes (Signed)
Results for LIELA, RYLEE (MRN [id number]) as of 10/09/2013 09:42  Ref. Range 10/08/2013 16:28 10/08/2013 21:37 10/09/2013 06:31 10/09/2013 06:59  Glucose-Capillary Latest Range: 70-99 mg/dL 184 (H) 89 58 (L) 76   CBGs have been less than 100 mg/dl.  If CBGs continue to be less than 100 mg/dl, recommend stopping Amaryl while in the hospital and continuing Levemir and Novolog MODERATE correction scale TID & HS. Will continue to follow while in hospital  Harvel Ricks RN BSN CDE

## 2013-10-09 NOTE — Telephone Encounter (Addendum)
Message copied by Gena Fray on Thu Oct 09, 2013  2:53 PM ------      Message from: Angelia Mould      Created: Thu Oct 09, 2013 10:21 AM      Regarding: f/u apt       This patient had an appointment on 10/15/2013. This needs to be changed to 11/05/2013. Thank you. CD ------  10/09/13: no answer or vm at home #. Pt is still admitted, lm with RN- mailed letter, dpm

## 2013-10-09 NOTE — Progress Notes (Signed)
Physical Therapy Treatment Patient Details Name: [name] [name] MRN: [id number] DOB: [birth year] Today's Date: 10/09/2013    History of Present Illness Pt is a 68 y.o. female presenting s/p L common femoral artery to below knee popliteal artery bypass, endarterectomy left below knee popliteal artery and intraoperative arteriogram. hx of obesity, limb ischemia, HTN, DM, hyperlipidemia, stroke, B carotid endartectomies, and nonhealing ulcer L plantar surface for 6 months.     PT Comments    **Significant progress with ambulation today, she walked 120' with RW, no LOB. Performed LLE exercises with min assist. Closed, non-draining wound noted on L foot, RN aware. Per pt this is chronic and home health had been applying a dressing to it prior to admission.  *  Follow Up Recommendations  Home health PT;Supervision/Assistance - 24 hour     Equipment Recommendations  None recommended by PT    Recommendations for Other Services       Precautions / Restrictions Precautions Precautions: Fall Precaution Comments: pt fell a month ago while attempting to put on pants in standing Restrictions Weight Bearing Restrictions: No    Mobility  Bed Mobility Overal bed mobility: Needs Assistance Bed Mobility: Sit to Supine     Supine to sit:  (and use of rail) Sit to supine: Min assist   General bed mobility comments: assist for BLEs into bed  Transfers Overall transfer level: Needs assistance Equipment used: Rolling walker (2 wheeled) Transfers: Sit to/from [facility] Sit to Stand: Min assist (with increased time and cues to lean forward more) Stand pivot transfers: Mod assist       General transfer comment: assist to power up from bed, increased time, cues for hand placement  Ambulation/Gait Ambulation/Gait assistance: Min guard Ambulation Distance (Feet): 120 Feet Assistive device: Rolling walker (2 wheeled) Gait Pattern/deviations: Step-through pattern;Decreased  step length - left;Antalgic;Decreased weight shift to left Gait velocity: decr Gait velocity interpretation: Below normal speed for age/gender General Gait Details: steady with RW   Stairs            Wheelchair Mobility    Modified Rankin (Stroke Patients Only)       Balance Overall balance assessment: Needs assistance Sitting-balance support: Feet supported Sitting balance-Leahy Scale: Good     Standing balance support: Bilateral upper extremity supported Standing balance-Leahy Scale: Poor Standing balance comment: requires BUE support in standing 2* LLE pain                    Cognition Arousal/Alertness: Awake/alert Behavior During Therapy: WFL for tasks assessed/performed Overall Cognitive Status: Within Functional Limits for tasks assessed                      Exercises General Exercises - Lower Extremity Ankle Circles/Pumps: AROM;Both;10 reps;Supine Quad Sets: AROM;Both;5 reps;Supine Gluteal Sets: AROM;Both;5 reps;Supine Heel Slides: AAROM;Left;10 reps;Supine Hip ABduction/ADduction: AAROM;Left;10 reps;Supine    General Comments        Pertinent Vitals/Pain Pain Assessment: 0-10 Pain Score: 8  Pain Location: LLE Pain Descriptors / Indicators: Cramping Pain Intervention(s): Limited activity within patient's tolerance;Monitored during session;Patient requesting pain meds-RN notified    Home Living                      Prior Function            PT Goals (current goals can now be found in the care plan section) Acute Rehab PT Goals Patient Stated Goal: to go home, to walk  without assistive device PT Goal Formulation: With patient Time For Goal Achievement: 10/22/13 Potential to Achieve Goals: Good Progress towards PT goals: Progressing toward goals    Frequency  Min 3X/week    PT Plan Current plan remains appropriate    Co-evaluation             End of Session Equipment Utilized During Treatment: Gait  belt Activity Tolerance: Patient limited by pain Patient left: with call bell/phone within reach;in bed (in w/c awaiting transport to new unit)     Time: 9499-7182 PT Time Calculation (min): 24 min  Charges:  $Gait Training: 8-22 mins $Therapeutic Exercise: 8-22 mins                    G Codes:      Philomena Doheny 10/09/2013, 1:26 PM [phone]

## 2013-10-10 DIAGNOSIS — I70245 Atherosclerosis of native arteries of left leg with ulceration of other part of foot: Secondary | ICD-10-CM | POA: Diagnosis not present

## 2013-10-10 LAB — GLUCOSE, CAPILLARY
GLUCOSE-CAPILLARY: 117 mg/dL — AB (ref 70–99)
GLUCOSE-CAPILLARY: 37 mg/dL — AB (ref 70–99)
GLUCOSE-CAPILLARY: 113 mg/dL — AB (ref 70–99)

## 2013-10-10 MED ORDER — DEXTROSE 50 % IV SOLN
INTRAVENOUS | Status: AC
  Administered 2013-10-10: 50 mL
  Filled 2013-10-10: qty 50

## 2013-10-10 MED ORDER — OXYCODONE-ACETAMINOPHEN 5-325 MG PO TABS: 1.0000 | ORAL_TABLET | Freq: Four times a day (QID) | ORAL | Status: DC | PRN

## 2013-10-10 MED ORDER — INFLUENZA VAC SPLIT QUAD 0.5 ML IM SUSY
0.5000 mL | PREFILLED_SYRINGE | Freq: Once | INTRAMUSCULAR | Status: AC
  Administered 2013-10-10: 0.5 mL via INTRAMUSCULAR
  Filled 2013-10-10: qty 0.5

## 2013-10-10 MED ORDER — INFLUENZA VAC SPLIT QUAD 0.5 ML IM SUSY: 0.5000 mL | PREFILLED_SYRINGE | INTRAMUSCULAR | Status: DC

## 2013-10-10 NOTE — Progress Notes (Signed)
   VASCULAR SURGERY ASSESSMENT & PLAN:  * 3 Days Post-Op s/p: left femoral to below knee popliteal artery bypass with PTFE. Her graft is patent.  *  Okay for discharge later this morning if her blood sugar is stable.  * I have scheduled an office follow up visit on 11/05/2013 and canceled her appointment on 10/15/2013.  SUBJECTIVE: had an episode of hypoglycemia earlier this morning but feels better now.  PHYSICAL EXAM: Filed Vitals:   10/08/13 2142 10/09/13 0510 10/09/13 2120 10/10/13 0417  BP: 150/39 163/42 143/51 153/49  Pulse: 62 54 63 61  Temp: 99.3 F (37.4 C) 98.9 F (37.2 C) 98.3 F (36.8 C) 97.8 F (36.6 C)  TempSrc: Oral Oral Oral Oral  Resp: _0 Height:      Weight:      SpO2: 97% 96% 92% 98%   Her incisions look fine. Her left foot is warm and well-perfused. The small wound on the plantar aspect of the left foot is unchanged.  LABS: Lab Results  Component Value Date   WBC 8.3 10/08/2013   HGB 8.9* 10/08/2013   HCT 28.3* 10/08/2013   MCV 87.6 10/08/2013   PLT 159 10/08/2013   Lab Results  Component Value Date   CREATININE 0.96 10/08/2013   Lab Results  Component Value Date   INR 1.15 10/06/2013   CBG (last 3)   Recent Labs  10/09/13 2124 10/10/13 0501 10/10/13 0523  GLUCAP 131* 37* 117*   Active Problems:   PAD (peripheral artery disease)   Critical lower limb ischemia  Gae Gallop Beeper: [phone] 10/10/2013

## 2013-10-10 NOTE — Progress Notes (Signed)
Reviewed DC summary, meds, HH services, and followup appts.Patient home with daughter in law, wheeled out by volunteer. Glade Nurse, RN

## 2013-10-10 NOTE — Progress Notes (Signed)
Occupational Therapy Treatment Patient Details Name: [name] [name] MRN: [id number] DOB: 1945/08/22 Today's Date: 10/10/2013    History of present illness Pt is a 68 y.o. female presenting s/p L common femoral artery to below knee popliteal artery bypass, endarterectomy left below knee popliteal artery and intraoperative arteriogram. hx of obesity, limb ischemia, HTN, DM, hyperlipidemia, stroke, B carotid endartectomies, and nonhealing ulcer L plantar surface for 6 months.    OT comments  Pt is making progress in OT.  Used AE.  She likes standard sock aide better than wide:  This one was difficult for her to use.  She plans to get this prior to discharge.  Follow Up Recommendations  Home health OT;Supervision/Assistance - 24 hour    Equipment Recommendations  None recommended by OT    Recommendations for Other Services      Precautions / Restrictions Precautions Precautions: Fall Precaution Comments: pt fell a month ago while attempting to put on pants in standing Restrictions Weight Bearing Restrictions: No       Mobility Bed Mobility                  Transfers   Equipment used: Rolling walker (2 wheeled) Transfers: Sit to/from [facility] Sit to Stand: Min assist Stand pivot transfers: Min guard       General transfer comment: assistance to power up from sitting.  Pt uses momentum and rocks.      Balance                                   ADL Overall ADL's : Needs assistance/impaired             Lower Body Bathing: Minimal assistance;Sit to/from stand;With adaptive equipment           Toilet Transfer: Minimal assistance;BSC;Stand-pivot   Toileting- Water quality scientist and Hygiene: Min guard;Sit to/from stand         General ADL Comments: pt performed adl from recliner:  had urgency and had to use 3:1 commode.  Reviewed energy conservation:  she did initiate a couple of rest breaks.  Encouraged pursed lip  breathing.  Tried wide sock aide, but pt had difficulty putting sock on this and it was difficult to pull over leg.  Pt had hypoglycemic episode earlier:  last CBG was 113.  She still feels sugar is off--asked nursing to recheck      Vision                     Perception     Praxis      Cognition   Behavior During Therapy: [facility] for tasks assessed/performed Overall Cognitive Status: Within Functional Limits for tasks assessed                       Extremity/Trunk Assessment               Exercises     Shoulder Instructions       General Comments      Pertinent Vitals/ Pain       Pain Assessment: 0-10 Pain Score: 6  Pain Location: L LE Pain Descriptors / Indicators: Aching Pain Intervention(s): Limited activity within patient's tolerance;Monitored during session;Repositioned  Home Living  Prior Functioning/Environment              Frequency Min 2X/week     Progress Toward Goals  OT Goals(current goals can now be found in the care plan section)  Progress towards OT goals: Progressing toward goals     Plan      Co-evaluation                 End of Session     Activity Tolerance Patient tolerated treatment well   Patient Left in chair;with call bell/phone within reach   Nurse Communication          Time: [phone] OT Time Calculation (min): 31 min  Charges: OT General Charges $OT Visit: 1 Procedure OT Treatments $Self Care/Home Management : 23-37 mins  Avarose Mervine 10/10/2013, 9:39 AM  Lesle Chris, OTR/L [phone] 10/10/2013

## 2013-10-10 NOTE — Discharge Summary (Signed)
[facility] Discharge Summary   Patient ID:  [name] [name] MRN: [id number] DOB/AGE: 66-Jun-1947 66 y.o.  Admit date: 10/06/2013 Discharge date: 10/10/2013 Date of Surgery: 10/06/2013 - 10/07/2013 Surgeon: Juliann Mule): Angelia Mould, MD  Admission Diagnosis: critical limb ischemia PVD Left  Discharge Diagnoses:  critical limb ischemia PVD Left  Secondary Diagnoses: Past Medical History  Diagnosis Date  . Chronic combined systolic and diastolic CHF (congestive heart failure)     a. 11/2010 TEE: EF 40-45%, no veg or LA thrombus.  . Mixed hyperlipidemia   . Essential hypertension, benign   . Depression   . Cellulitis     a. Recurrent, bilateral legs  . Carotid artery disease     a. Bilateral CEA; 50-60% bilateral ICA stenosis, 11/12  . PAD (peripheral artery disease)   . Suicide attempt 08/2002  . TIA (transient ischemic attack)   . TMJ syndrome   . Herniated disc   . Ischemic cardiomyopathy     a. 11/2010 TEE: EF 40-45%.  . Critical lower limb ischemia   . Venous insufficiency   . Coronary atherosclerosis of native coronary artery     a. 11/2010 NSTEMI/CABG x 4: L-LAD; S-PL; S-OM; S-DX, by PVT.  [name] NSTEMI (non-ST elevated myocardial infarction) 11/2010  . Pneumonia 2000's X 2  . Type 2 diabetes mellitus   . History of blood transfusion     "right before hysterectomy" (10/06/2013)  . Migraine     "maybe monthly" (10/06/2013)  . Stroke 08/2009    "occasional weakness or foot drag since if I don't get enough sleep (10/06/2013)  . Arthritis     "knees, hands, neck, back" (10/06/2013)  . Chronic back pain     "neck down"  . On home oxygen therapy     "2L q hs & during the day prn"    Procedure(s): LEFT FEMORAL-POPLITEAL ARTERY BYPASS GRAFT INTRA OPERATIVE ARTERIOGRAM LEFT LEG LEFT POPLITEAL ENDARTERECTOMY   Discharged Condition: good  HPI: 68 y/o female with PAD with critical limb ischemia who developed a wound on her left foot approximately 4  months ago.  She was evaluated by Dr. Quay Burow and presented for arteriography today. She had a long segment superficial femoral artery occlusion and was not a candidate for endovascular approach. Vascular surgery was counseled for evaluation for possible bypass.    Hospital Course:  [name] [name] is a 68 y.o. female is S/P Neither Procedure(s): LEFT FEMORAL-POPLITEAL ARTERY BYPASS GRAFT INTRA OPERATIVE ARTERIOGRAM LEFT LEG LEFT POPLITEAL ENDARTERECTOMY  Post op she had 3 vessel runoff and brisk signals via doppler.  She developed nausea over night and it was treated with Zofran.  After being transfer to [facility] she received evaluations by PT/OT.  They recommended home PT due to poor mobility and pain interfering with ambulation.  An x ray of the left foot revealed no osteomyelitis.  She will follow up with her foot doctor as an outpatient.  She was discharged home in stable condition today 10/10/2013.  Post op ABI shows left 0.91 and right 0.45  Consults:     Significant Diagnostic Studies: CBC Lab Results  Component Value Date   WBC 8.3 10/08/2013   HGB 8.9* 10/08/2013   HCT 28.3* 10/08/2013   MCV 87.6 10/08/2013   PLT 159 10/08/2013    BMET    Component Value Date/Time   NA 139 10/08/2013 0450   K 4.1 10/08/2013 0450   CL 105 10/08/2013 0450   CO2 25 10/08/2013 0450  GLUCOSE 118* 10/08/2013 0450   BUN 23 10/08/2013 0450   CREATININE 0.96 10/08/2013 0450   CALCIUM 8.0* 10/08/2013 0450   GFRNONAA 60* 10/08/2013 0450   GFRAA 70* 10/08/2013 0450   COAG Lab Results  Component Value Date   INR 1.15 10/06/2013   INR 1.13 09/08/2013   INR 1.02 03/27/2013     Disposition:  Discharge to :Home Discharge Instructions   Call MD for:  redness, tenderness, or signs of infection (pain, swelling, bleeding, redness, odor or green/yellow discharge around incision site)    Complete by:  As directed      Call MD for:  severe or increased pain, loss or decreased feeling  in affected limb(s)     Complete by:  As directed      Call MD for:  temperature >100.5    Complete by:  As directed      Discharge instructions    Complete by:  As directed   You may shower.  After showing dry groin area completely.     Driving Restrictions    Complete by:  As directed   No driving for until you no longer need pain medication     Increase activity slowly    Complete by:  As directed   Walk with assistance use walker or cane as needed     Lifting restrictions    Complete by:  As directed   No lifting for 6 weeks     Resume previous diet    Complete by:  As directed             Medication List         amLODipine 10 MG tablet  Commonly known as:  NORVASC  Take 10 mg by mouth daily.     aspirin 81 MG EC tablet  Take 81 mg by mouth daily.     atorvastatin 40 MG tablet  Commonly known as:  LIPITOR  Take 40 mg by mouth daily at 6 PM.     carvedilol 12.5 MG tablet  Commonly known as:  COREG  Take 18.75 mg by mouth 2 (two) times daily.     citalopram 20 MG tablet  Commonly known as:  CELEXA  Take 20 mg by mouth daily.     clopidogrel 75 MG tablet  Commonly known as:  PLAVIX  Take 75 mg by mouth daily with breakfast.     furosemide 40 MG tablet  Commonly known as:  LASIX  Take 60 mg by mouth every morning.     glimepiride 2 MG tablet  Commonly known as:  AMARYL  Take 2 mg by mouth 2 (two) times daily.     HYDROcodone-acetaminophen 5-325 MG per tablet  Commonly known as:  NORCO/VICODIN  Take 1 tablet by mouth every 6 (six) hours as needed for moderate pain.     insulin aspart 100 UNIT/ML injection  Commonly known as:  novoLOG  Inject 8 Units into the skin 3 (three) times daily with meals.     insulin detemir 100 UNIT/ML injection  Commonly known as:  LEVEMIR  Inject 36 Units into the skin at bedtime.     isosorbide mononitrate 30 MG 24 hr tablet  Commonly known as:  IMDUR  Take 30 mg by mouth daily.     lisinopril 20 MG tablet  Commonly known as:   PRINIVIL,ZESTRIL  Take 20 mg by mouth daily.     multivitamin with minerals Tabs tablet  Take 1 tablet by mouth  daily.     nitroGLYCERIN 0.4 MG SL tablet  Commonly known as:  NITROSTAT  Place 1 tablet (0.4 mg total) under the tongue every 5 (five) minutes as needed for chest pain. Up to 3 doses. If no relief after 3rd dose, proceed to the ED for an evaluation     oxyCODONE-acetaminophen 5-325 MG per tablet  Commonly known as:  PERCOCET/ROXICET  Take 1 tablet by mouth every 6 (six) hours as needed for severe pain.     potassium chloride 10 MEQ tablet  Commonly known as:  K-DUR  Take 10 mEq by mouth daily.     PRESCRIPTION MEDICATION  2 liters of Oxygen     STOOL SOFTENER PO  Take 2 capsules by mouth daily as needed (for constipation).       Verbal and written Discharge instructions given to the patient. Wound care per Discharge AVS     Follow-up Information   Follow up with DICKSON,CHRISTOPHER S, MD In 2 weeks. (message sent to office)    Specialty:  Vascular Surgery   Contact information:   [address] [place] [address] [phone]       Signed: Laurence Slate [facility] 10/10/2013, 1:55 PM  - For [facility] Registry use --- Instructions: Press F2 to tab through selections.  Delete question if not applicable.   Post-op:  Wound infection: No  Graft infection: No  Transfusion: No  If yes, 0 units given New Arrhythmia: No Ipsilateral amputation: [x ] no, _0  Minor, _1  BKA, _2  AKA Discharge patency: [x ] Primary, _3  Primary assisted, _4  Secondary, _5  Occluded Patency judged by: [x ] Dopper only, _6  Palpable graft pulse, _7  Palpable distal pulse, _8  ABI inc. > 0.15, _9  Duplex Discharge ABI: R 0.45, L 0.91  D/C Ambulatory Status: Ambulatory  Complications: MI: [x ] No, _10  Troponin only, _11  EKG or Clinical CHF: No Resp failure: [x ] none, _12  Pneumonia, _13  Ventilator Chg in renal function: [x ] none, _14  Inc. Cr > 0.5, _15  Temp. Dialysis, _16  Permanent  dialysis Stroke: [x ] None, _17  Minor, _18  Major Return to OR: No  Reason for return to OR: _19  Bleeding, _20  Infection, _21  Thrombosis, _22  Revision  Discharge medications: Statin use:  Yes ASA use:  Yes Plavix use:  Yes Beta blocker use: Yes Coumadin use: No  for medical reason

## 2013-10-10 NOTE — Progress Notes (Signed)
Hypoglycemic Event  CBG:37  Treatment:D50 (15m)  Symptoms:weakness,sweating  Follow-up CBG: TKCLE:[id number] Result:117  Possible Reasons for Event:unknown  Comments/MD notified:no    ANetta Corrigan Tiwanda Threats [place]  Remember to initiate Hypoglycemia Order Set & complete

## 2013-10-13 NOTE — Discharge Summary (Signed)
Agree wit plans for D/C.  Deitra Mayo, MD, [place] [phone] 10/13/2013

## 2013-10-15 ENCOUNTER — Ambulatory Visit: Payer: Medicare Other | Admitting: Vascular Surgery

## 2013-10-16 ENCOUNTER — Encounter: Payer: Self-pay | Admitting: Cardiovascular Disease

## 2013-10-16 ENCOUNTER — Telehealth: Payer: Self-pay

## 2013-10-16 NOTE — Telephone Encounter (Signed)
Phone call from [facility] RN.  Stated pt. Is very anxious and hasn't been able to rest well since her surgery.  Reported she is staying at her son's home, during the immediate post-op recovery.  Stated "the pt. just needs something to take the edge off."  Requested to have Dr. Scot Dock consider ordering either a muscle relaxant or a nerve pill.  Reported that the pt. continues to have swelling in the left leg; and, her PCP put her on a fluid pill recently.  Stated her incision looks good; denies any incisional drainage.  Denies fever/chills. Stated pt. Is elevating her legs intermittently.  Advised the [facility] RN that Dr. Scot Dock usually prefers that the PCP order muscle relaxants or nerve tablets.  Requested that the [facility] RN call pt's PCP.  Agreed.

## 2013-10-28 ENCOUNTER — Other Ambulatory Visit: Payer: Self-pay

## 2013-10-30 ENCOUNTER — Other Ambulatory Visit: Payer: Self-pay

## 2013-10-31 ENCOUNTER — Ambulatory Visit (INDEPENDENT_AMBULATORY_CARE_PROVIDER_SITE_OTHER): Payer: Medicare Other | Admitting: Cardiovascular Disease

## 2013-10-31 ENCOUNTER — Encounter: Payer: Self-pay | Admitting: Cardiovascular Disease

## 2013-10-31 VITALS — BP 193/67 | HR 55 | Ht 67.0 in | Wt 213.9 lb

## 2013-10-31 DIAGNOSIS — I739 Peripheral vascular disease, unspecified: Secondary | ICD-10-CM

## 2013-10-31 DIAGNOSIS — I70229 Atherosclerosis of native arteries of extremities with rest pain, unspecified extremity: Secondary | ICD-10-CM

## 2013-10-31 DIAGNOSIS — I2510 Atherosclerotic heart disease of native coronary artery without angina pectoris: Secondary | ICD-10-CM

## 2013-10-31 DIAGNOSIS — I10 Essential (primary) hypertension: Secondary | ICD-10-CM

## 2013-10-31 DIAGNOSIS — I998 Other disorder of circulatory system: Secondary | ICD-10-CM

## 2013-10-31 DIAGNOSIS — I779 Disorder of arteries and arterioles, unspecified: Secondary | ICD-10-CM

## 2013-10-31 DIAGNOSIS — E782 Mixed hyperlipidemia: Secondary | ICD-10-CM

## 2013-10-31 NOTE — Assessment & Plan Note (Signed)
Her blood pressure is elevated today, higher than what it usually runs when she measures it at home

## 2013-10-31 NOTE — Assessment & Plan Note (Signed)
I performed angiography on the [facility] on 8/31 20/15 15 revealed a 90% mid right SFA stenosis with one vessel runoff via the peroneal and an occluded left SFA at its origin with reconstitution in the above-knee popliteal artery with 2 vessel runoff. On 09/28/13 I attempted to percutaneously revascularize her left SFA on successfully and the following day she underwent left common femoral to below-the-knee popliteal artery bypass with a 6 mm PTFE graft by Dr. Gae Gallop. She was discharged several days later with an operative result. Her surgical wounds are healed. She still has a small sore on the plantar aspect of her left foot. She does complain of right calf claudication and is a candidate for percutaneous revascularization at this point will wait and allow her to recover from her recent operation.

## 2013-10-31 NOTE — Assessment & Plan Note (Signed)
On statin therapy, followed by her PCP

## 2013-10-31 NOTE — Patient Instructions (Signed)
Your physician wants you to follow-up in: 6 months with Dr Gwenlyn Found. You will receive a reminder letter in the mail two months in advance. If you don't receive a letter, please call our office to schedule the follow-up appointment.

## 2013-10-31 NOTE — Progress Notes (Signed)
10/31/2013 [name] [name]   [birth year]  [id number]  Primary Physician Glenda Chroman., MD Primary Cardiologist: [name] MD [name]   HPI:  [name] [name] is a 68 year old moderately overweight Caucasian female mother of 4 children, grandmother of the grandchildren referred by Dr. Ladona Horns for critical limb ischemia. Her primary care physician is Dr. Allie Dimmer cardiovascular risk factor profile is remarkable for 100-150-pack-years of tobacco abuse having quit 3 years ago prior to her bypass graft surgery. She has treated hypertension, diabetes and hyperlipidemia. Her father died of a myocardial infarction. She has had a gait MRI as well as a stroke. She a priori bypass grafting x4 in 2004 by Dr. Tharon Aquas Trigt She's also had bilateral carotid endarterectomies. She has had a nonhealing ulcer on the plantar surface of her left foot for 6 months. Lower extremity arterial Doppler studies performed in the office 08/27/13 revealed a left ABI of 0.62 with occluded left SFA. I intubated her on 09/08/13 revealing an occluded SFA the origin with reconstitution hunters canal two-vessel runoff the ulcer on the bottom of her foot has progressed and she has a new ulcer on the lateral aspect of her left calf. Based on this I decided to proceed with attempt at percutaneous vascularization. On 10/06/13 I was unsuccessful at percutaneously revascularizing her left SFA for critical limb ischemia and she'll underwent left femoral to below knee bypass grafting with a PTFE 6 mm graft. Her surgical wounds have healed. She is a small punctate ulcer on the plantar aspect of her left foot. She still continues to complain of some right calf claudication.  Current Outpatient Prescriptions  Medication Sig Dispense Refill  . amLODipine (NORVASC) 10 MG tablet Take 10 mg by mouth daily.      [name] aspirin 81 MG EC tablet Take 81 mg by mouth daily.      [name] atorvastatin (LIPITOR) 40 MG tablet Take 40 mg by mouth daily at 6  PM.      . carvedilol (COREG) 12.5 MG tablet Take 18.75 mg by mouth 2 (two) times daily.      . citalopram (CELEXA) 20 MG tablet Take 20 mg by mouth daily.      . clopidogrel (PLAVIX) 75 MG tablet Take 75 mg by mouth daily with breakfast.      . Docusate Calcium (STOOL SOFTENER PO) Take 2 capsules by mouth daily as needed (for constipation).      . furosemide (LASIX) 40 MG tablet Take 60 mg by mouth every morning.      [name] glimepiride (AMARYL) 2 MG tablet Take 2 mg by mouth 2 (two) times daily.       [name] HYDROcodone-acetaminophen (NORCO/VICODIN) 5-325 MG per tablet Take 1 tablet by mouth every 6 (six) hours as needed for moderate pain.       [name] insulin aspart (NOVOLOG) 100 UNIT/ML injection Inject 8 Units into the skin 3 (three) times daily with meals.      . insulin detemir (LEVEMIR) 100 UNIT/ML injection Inject 36 Units into the skin at bedtime.       . isosorbide mononitrate (IMDUR) 30 MG 24 hr tablet Take 30 mg by mouth daily.      [name] lisinopril (PRINIVIL,ZESTRIL) 20 MG tablet Take 20 mg by mouth daily.      . Multiple Vitamin (MULTIVITAMIN WITH MINERALS) TABS tablet Take 1 tablet by mouth daily.      . nitroGLYCERIN (NITROSTAT) 0.4 MG SL tablet Place 1 tablet (0.4 mg total)  under the tongue every 5 (five) minutes as needed for chest pain. Up to 3 doses. If no relief after 3rd dose, proceed to the ED for an evaluation  25 tablet  3  . oxyCODONE-acetaminophen (PERCOCET/ROXICET) 5-325 MG per tablet Take 1 tablet by mouth every 6 (six) hours as needed for severe pain.  30 tablet  0  . potassium chloride (K-DUR) 10 MEQ tablet Take 10 mEq by mouth daily.      [name] PRESCRIPTION MEDICATION 2 liters of Oxygen       No current facility-administered medications for this visit.    Allergies  Allergen Reactions  . Other Shortness Of Breath, Rash and Other (See Comments)    All berries   . Strawberry Hives, Shortness Of Breath and Rash  . Sulfa Antibiotics Swelling    History   Social History  .  Marital Status: Widowed    Spouse Name: N/A    Number of Children: N/A  . Years of Education: N/A   Occupational History  . Not on file.   Social History Main Topics  . Smoking status: Former Smoker -- 3.00 packs/day for 50 years    Types: Cigarettes    Quit date: 10/09/2009  . Smokeless tobacco: Never Used  . Alcohol Use: Yes     Comment: 10/06/2013 "might have a drink q 2-3 months"  . Drug Use: Yes    Special: Marijuana     Comment: "smoked pot in my teens"  . Sexual Activity: Not Currently   Other Topics Concern  . Not on file   Social History Narrative   Lives in [place] by herself.  She does not work.     Review of Systems: General: negative for chills, fever, night sweats or weight changes.  Cardiovascular: negative for chest pain, dyspnea on exertion, edema, orthopnea, palpitations, paroxysmal nocturnal dyspnea or shortness of breath Dermatological: negative for rash Respiratory: negative for cough or wheezing Urologic: negative for hematuria Abdominal: negative for nausea, vomiting, diarrhea, bright red blood per rectum, melena, or hematemesis Neurologic: negative for visual changes, syncope, or dizziness All other systems reviewed and are otherwise negative except as noted above.    Blood pressure 193/67, pulse 55, height _0  (1.702 m), weight 213 lb 14.4 oz (97.024 kg).  General appearance: alert and no distress Neck: no adenopathy, no JVD, supple, symmetrical, trachea midline, thyroid not enlarged, symmetric, no tenderness/mass/nodules and soft bilateral carotid bruits Lungs: clear to auscultation bilaterally Heart: regular rate and rhythm, S1, S2 normal, no murmur, click, rub or gallop Extremities: the edema bilaterally with venous stasis changes, healed surgical wounds left groin and below the knee  EKG not performed to  ASSESSMENT AND PLAN:   Critical lower limb ischemia I performed angiography on the [place] on 8/31 20/15 15 revealed a 90% mid right SFA  stenosis with one vessel runoff via the peroneal and an occluded left SFA at its origin with reconstitution in the above-knee popliteal artery with 2 vessel runoff. On 09/28/13 I attempted to percutaneously revascularize her left SFA on successfully and the following day she underwent left common femoral to below-the-knee popliteal artery bypass with a 6 mm PTFE graft by Dr. Gae Gallop. She was discharged several days later with an operative result. Her surgical wounds are healed. She still has a small sore on the plantar aspect of her left foot. She does complain of right calf claudication and is a candidate for percutaneous revascularization at this point will wait and allow her to  recover from her recent operation.  Carotid artery disease History of bilateral carotid endarterectomies remotely with recent carotid Dopplers performed several months ago that showed moderate bilateral ICA stenosis but otherwise patent endarterectomy sites.  Hypertension, uncontrolled Her blood pressure is elevated today, higher than what it usually runs when she measures it at home  Mixed hyperlipidemia On statin therapy, followed by her PCP      [name] MD [facility], [facility] 10/31/2013 3:46 PM

## 2013-10-31 NOTE — Assessment & Plan Note (Signed)
History of bilateral carotid endarterectomies remotely with recent carotid Dopplers performed several months ago that showed moderate bilateral ICA stenosis but otherwise patent endarterectomy sites.

## 2013-11-04 ENCOUNTER — Encounter: Payer: Self-pay | Admitting: Vascular Surgery

## 2013-11-05 ENCOUNTER — Ambulatory Visit (INDEPENDENT_AMBULATORY_CARE_PROVIDER_SITE_OTHER): Payer: Medicare Other | Admitting: Vascular Surgery

## 2013-11-05 ENCOUNTER — Encounter: Payer: Self-pay | Admitting: Vascular Surgery

## 2013-11-05 VITALS — BP 148/56 | HR 51 | Temp 98.1°F | Ht 67.0 in | Wt 214.5 lb

## 2013-11-05 DIAGNOSIS — I739 Peripheral vascular disease, unspecified: Secondary | ICD-10-CM

## 2013-11-05 DIAGNOSIS — Z48812 Encounter for surgical aftercare following surgery on the circulatory system: Secondary | ICD-10-CM

## 2013-11-05 NOTE — Progress Notes (Signed)
   Patient name: [name] [name] MRN: [id number] DOB: Dec 18, 1945 Sex: female  REASON FOR VISIT: Follow up after left femoropopliteal bypass   VQI PATIENT  HPI: [name] [name] is a 68 y.o. female who presented with a nonhealing wound of her left foot and infrainguinal arterial occlusive disease. She underwent a left common femoral artery to below knee popliteal artery bypass with a 6 mm PTFE as she had no available vein for bypass. She also required endarterectomy of the left below knee popliteal artery. She has 2 vessel runoff on the left via the posterior tibial and peroneal arteries.  Patient is doing well and has no specific complaints except for some left leg swelling. She denies fever or chills.  REVIEW OF SYSTEMS: Valu.Nieves ] denotes positive finding; [  ] denotes negative finding  CARDIOVASCULAR:  _0  chest pain   _1  dyspnea on exertion    CONSTITUTIONAL:  _2  fever   _3  chills  PHYSICAL EXAM: Filed Vitals:   11/05/13 0928  BP: 148/56  Pulse: 51  Temp: 98.1 F (36.7 C)  TempSrc: Oral  Height: _4  (1.702 m)  Weight: 214 lb 8 oz (97.297 kg)  SpO2: 93%   Body mass index is 33.59 kg/(m^2). GENERAL: The patient is a well-nourished female, in no acute distress. The vital signs are documented above. CARDIOVASCULAR: There is a regular rate and rhythm. PULMONARY: There is good air exchange bilaterally without wheezing or rales. Her incisions are healing nicely. The left foot is warm and well-perfused. She has moderate bilateral lower extremity swelling which is more significant on the left.  MEDICAL ISSUES: Patient is doing well status post left femoropopliteal bypass with PTFE. I discussed the importance of leg elevation in the proper positioning for this. She is not a smoker. I have encouraged her to stay as active as possible. I'll see her back in 6 months with a follow up duplex of her graft. She knows to call sooner if she has problems.  VQI DATA: the patient is on aspirin and is on a  statin.  [place] [facility] [place] Beeper: [phone]

## 2013-11-05 NOTE — Addendum Note (Signed)
Addended by: Mena Goes on: 11/05/2013 02:01 PM   Modules accepted: Orders

## 2013-11-10 ENCOUNTER — Encounter: Payer: Self-pay | Admitting: Family

## 2013-11-10 ENCOUNTER — Telehealth: Payer: Self-pay

## 2013-11-10 ENCOUNTER — Ambulatory Visit (INDEPENDENT_AMBULATORY_CARE_PROVIDER_SITE_OTHER): Payer: Medicare Other | Admitting: Family

## 2013-11-10 VITALS — BP 181/69 | HR 51 | Temp 96.9°F | Resp 20 | Ht 67.0 in | Wt 213.0 lb

## 2013-11-10 DIAGNOSIS — I739 Peripheral vascular disease, unspecified: Secondary | ICD-10-CM | POA: Insufficient documentation

## 2013-11-10 DIAGNOSIS — M79605 Pain in left leg: Secondary | ICD-10-CM

## 2013-11-10 DIAGNOSIS — M79602 Pain in left arm: Secondary | ICD-10-CM

## 2013-11-10 MED ORDER — HYDROCODONE-ACETAMINOPHEN 5-325 MG PO TABS: 1.0000 | ORAL_TABLET | Freq: Four times a day (QID) | ORAL | Status: DC | PRN

## 2013-11-10 MED ORDER — LEVOFLOXACIN 500 MG PO TABS: 500.0000 mg | ORAL_TABLET | Freq: Every day | ORAL | Status: DC

## 2013-11-10 NOTE — Telephone Encounter (Signed)
Rec'd phone call from [facility] RN.  Reported she noted a half dollar sized discoloration at proximal end of left LE incision, one week ago.  Today, upon [facility] visit, noted the area had opened to approx. 2 cm x 1 cm.  Pt. Reported to her [facility] nurse that this opened up on Friday.  Reported there is a "milky, bloody drainage" from the opening in incision.  Reported pt. has had temperature 99.5, in the evenings.  Stated she is afebrile today.  [facility] nurse applied a clean, dry gauze dressing to site.  Requested pt. be evaluated.  Called pt.; appt. given for 1:40 PM today with Nurse Practitioner.  Agreed with plan.

## 2013-11-10 NOTE — Patient Instructions (Signed)

## 2013-11-10 NOTE — Progress Notes (Signed)
    Postoperative Visit   History of Present Illness  [name] [name] is a 68 y.o. year old female patient of Dr. Scot Dock who initially presented with a nonhealing wound of her left foot and infrainguinal arterial occlusive disease. She underwent a left common femoral artery to below knee popliteal artery bypass with a 6 mm PTFE as she had no available vein for bypass on 10/03/13. She also required endarterectomy of the left below knee popliteal artery. She had 2 vessel runoff on the left via the posterior tibial and peroneal arteries. She presents today for surgical incision wound check.  Triage nurse at [facility] today received phone call from [facility] RN. Reported she noted a half dollar sized discoloration at proximal end of left LE incision, one week ago. Today, upon [facility] visit, noted the area had opened to approx. 2 cm x 1 cm. Pt. Reported to her [facility] nurse that this opened up on Friday. Reported there is a "milky, bloody drainage" from the opening in incision. Reported pt. has had temperature 99.5, in the evenings. Stated she is afebrile today. [place] nurse applied a clean, dry gauze dressing to site. Requested pt. be evaluated. Pt has a history of recurrent cellulitis, states left lower leg redness started yesterday. She ran out of her hydrocodone yesterday, left lower leg is very painful The patient states that she has been elevating her legs.   For VQI Use Only  PRE-ADM LIVING: Home  AMB STATUS: Ambulatory with Assistance  Physical Examination  Filed Vitals:   11/10/13 1322  BP: 181/69  Pulse: 51  Temp: 96.9 F (36.1 C)  Resp: 20  Height: _0  (1.702 m)  Weight: 213 lb (96.616 kg)   Body mass index is 33.35 kg/(m^2).  LLE incisions: left groin incision is well healed. Left lower leg medial incision has opened at the proximal end  and some fatty subcutaneous tissue is exposed, moist, no drainage, no erythema surrounding incision but erythema is present distal to incision, both lower legs  have 2-3+ pitting edema, left DP pulse is 2+ palpable.  Medical Decision Making  [name] [name] is a 68 y.o. year old female who is s/p left common femoral artery to below knee popliteal artery bypass with a 6 mm PTFE as she had no available vein for bypass on 10/03/13. She also required endarterectomy of the left below knee popliteal artery. Dr. Trula Slade examined and spoke with patient. Levaquin 500 mg daily  X 10 days. Hydrocodone/acetaminophen 5-325, 1 tab every 6 hours prn pain, disp #30, 0 refills.  Return in 2 days to see Dr. Scot Dock for evaluation.   The patient's proximal aspect of left lower leg bypass incision has opened, with resolution of pre-operative symptoms.       The patient has a history of recurrent cellulitis, concern for possible graft infection.  I emphasized the importance of routine surveillance of the patient's bypass, as the vascular surgery literature emphasize the improved patency possible with assisted primary patency procedures versus secondary patency procedures. The patient agrees to participate in their maximal medical care and routine surveillance.  Thank you for allowing [place] to participate in this patient's care.  NICKEL, Sharmon Leyden, RN, MSN, FNP-C [facility] [place] Office: [phone]  11/10/2013, 1:25 PM  Clinic MD: Trula Slade

## 2013-11-11 ENCOUNTER — Encounter: Payer: Self-pay | Admitting: Vascular Surgery

## 2013-11-12 ENCOUNTER — Ambulatory Visit (INDEPENDENT_AMBULATORY_CARE_PROVIDER_SITE_OTHER): Payer: Medicare Other | Admitting: Vascular Surgery

## 2013-11-12 ENCOUNTER — Encounter: Payer: Self-pay | Admitting: Vascular Surgery

## 2013-11-12 VITALS — BP 183/50 | HR 63 | Ht 67.0 in | Wt 215.6 lb

## 2013-11-12 DIAGNOSIS — I739 Peripheral vascular disease, unspecified: Secondary | ICD-10-CM

## 2013-11-12 NOTE — Progress Notes (Signed)
   Patient name: [name] [name] MRN: [id number] DOB: [birth year] Sex: female  REASON FOR VISIT: Wound check.  HPI: [name] [name] is a 68 y.o. female who underwent a left common femoral artery to below knee popliteal artery bypass with 6 mm PTFE. She also required endarterectomy of the left below the knee popliteal artery. She called the office 2 days ago complaining of a discoloration at the proximal end of her left lower extremity incision. She reported some bloody drainage from this area. The drainage has stopped. She denies fever. She has had moderate left lower extremity swelling. She has been elevating her leg.   REVIEW OF SYSTEMS: Valu.Nieves ] denotes positive finding; [  ] denotes negative finding  CARDIOVASCULAR:  _0  chest pain   _1  dyspnea on exertion    CONSTITUTIONAL:  _2  fever   _3  chills  PHYSICAL EXAM: Filed Vitals:   11/12/13 0948  BP: 183/50  Pulse: 63  Height: _4  (1.702 m)  Weight: 215 lb 9.6 oz (97.796 kg)  SpO2: 93%   Body mass index is 33.76 kg/(m^2). GENERAL: The patient is a well-nourished female, in no acute distress. The vital signs are documented above. CARDIOVASCULAR: There is a regular rate and rhythm. PULMONARY: There is good air exchange bilaterally without wheezing or rales. She has moderate swelling of the left lower extremity. There is some slight separation of the incision below the knee superiorly but this does not go deep and it appears fairly superficial with some granulation tissue. The left groin incision looks fine.  MEDICAL ISSUES: I instructed her to keep a dry dressing on the wound as long as its draining at all. I'll see her back in 2 weeks. She also call sooner if she has problems.  [place] [facility] [place] Beeper: [phone]

## 2013-11-25 ENCOUNTER — Encounter: Payer: Self-pay | Admitting: Family

## 2013-11-26 ENCOUNTER — Ambulatory Visit: Payer: Medicare Other | Admitting: Family

## 2013-12-01 ENCOUNTER — Encounter: Payer: Self-pay | Admitting: Cardiology

## 2013-12-18 ENCOUNTER — Encounter (HOSPITAL_COMMUNITY): Payer: Self-pay | Admitting: Cardiovascular Disease

## 2014-01-23 ENCOUNTER — Ambulatory Visit (INDEPENDENT_AMBULATORY_CARE_PROVIDER_SITE_OTHER): Payer: Medicare Other | Admitting: Adult Health

## 2014-01-23 ENCOUNTER — Encounter: Payer: Self-pay | Admitting: Adult Health

## 2014-01-23 ENCOUNTER — Encounter: Payer: Self-pay | Admitting: Cardiology

## 2014-01-23 VITALS — BP 158/58 | HR 52 | Ht 66.0 in | Wt 214.0 lb

## 2014-01-23 DIAGNOSIS — Z720 Tobacco use: Secondary | ICD-10-CM

## 2014-01-23 DIAGNOSIS — F17200 Nicotine dependence, unspecified, uncomplicated: Secondary | ICD-10-CM

## 2014-01-23 DIAGNOSIS — I5042 Chronic combined systolic (congestive) and diastolic (congestive) heart failure: Secondary | ICD-10-CM

## 2014-01-23 DIAGNOSIS — I10 Essential (primary) hypertension: Secondary | ICD-10-CM

## 2014-01-23 DIAGNOSIS — R0602 Shortness of breath: Secondary | ICD-10-CM

## 2014-01-23 MED ORDER — TORSEMIDE 20 MG PO TABS: ORAL_TABLET | ORAL | Status: DC

## 2014-01-23 NOTE — Progress Notes (Signed)
HPI: [name] [name] is a 69 year female patient of Dr. Donnella Bi, who we are following with known history of arterial disease, with history of critical lower limb ischemia, found for angiography on 09/08/2013 with a 90% mid right SFA stenosis with one vessel runoff via the peroneal and an occluded left SFA at its origin with reconstruction in the above-knee popliteal artery with 2 vessel runoff.  The patient had a left common femoral below-the-knee popliteal artery bypass by Dr. Scot Dock in September 2015.  Other history includes, chronic combined systolic and diastolic CHF, bilateral carotid artery disease with endarterectomies, hypertension, and mixed hyperlipidemia.   She is seen today at the request of her primary care physician, Dr. Woody Seller, secondary to evidence of heart failure with weight gain found by home health nurse.most recent echocardiogram was completed on 03/18/2013:     Left ventricle: The cavity size was moderately dilated. Wall thickness was normal. The estimated ejection fraction was 30%. Diffuse hypokinesis. - Left atrium: The atrium was moderately dilated. - Atrial septum: No defect or patent foramen ovale was identified. - Pulmonary arteries: PA peak pressure: 34m Hg (S). - Impressions: Elevated LA pressure by E/E'  [facility] [place]  She was seen by Dr. JBryon Lionscardiologist, she was admitted for CHF.  Echocardiogram was completed revealing decreased ejection fraction of 55-60% and mild septal hypokinesis.  Possible diastolic dysfunction, RV systolic pressure of 52.   She is oxygen dependent from the summer admission where she had cardiac cath and stent placement SVG to diagonal. She does not have a pulmonologist, despite O2 dependence.   Allergies  Allergen Reactions  . Other Shortness Of Breath, Rash and Other (See Comments)    All berries   . Strawberry Hives, Shortness Of Breath  and Rash  . Sulfa Antibiotics Swelling    Current Outpatient Prescriptions  Medication Sig Dispense Refill  . amLODipine (NORVASC) 10 MG tablet Take 10 mg by mouth daily.    .Marland Kitchenaspirin 81 MG EC tablet Take 81 mg by mouth daily.    .Marland Kitchenatorvastatin (LIPITOR) 40 MG tablet Take 40 mg by mouth daily at 6 PM.    . carvedilol (COREG) 12.5 MG tablet Take 18.75 mg by mouth 2 (two) times daily.    . citalopram (CELEXA) 20 MG tablet Take 20 mg by mouth daily.    . clopidogrel (PLAVIX) 75 MG tablet Take 75 mg by mouth daily with breakfast.    . Docusate Calcium (STOOL SOFTENER PO) Take 2 capsules by mouth daily as needed (for constipation).    .Marland Kitchenglimepiride (AMARYL) 2 MG tablet Take 2 mg by mouth 2 (two) times daily.     .Marland KitchenHYDROcodone-acetaminophen (NORCO/VICODIN) 5-325 MG per tablet Take 1 tablet by mouth every 6 (six) hours as needed for moderate pain. 30 tablet 0  . insulin aspart (NOVOLOG) 100 UNIT/ML injection Inject 8 Units into the skin 3 (three) times daily with meals.    . insulin detemir (LEVEMIR) 100 UNIT/ML injection Inject 36 Units into the skin at bedtime.     . isosorbide mononitrate (IMDUR) 30 MG 24 hr tablet Take 30 mg by mouth daily.    .Marland Kitchenlisinopril (PRINIVIL,ZESTRIL) 20 MG tablet Take 20 mg by mouth daily.    . Multiple Vitamin (MULTIVITAMIN WITH MINERALS) TABS tablet Take 1 tablet by mouth daily.    . nitroGLYCERIN (NITROSTAT) 0.4 MG SL tablet Place 1 tablet (  0.4 mg total) under the tongue every 5 (five) minutes as needed for chest pain. Up to 3 doses. If no relief after 3rd dose, proceed to the ED for an evaluation 25 tablet 3  . potassium chloride (K-DUR) 10 MEQ tablet Take 10 mEq by mouth daily.    [name] PRESCRIPTION MEDICATION 2 liters of Oxygen    . torsemide (DEMADEX) 20 MG tablet Take 40 mg in the am, and 20 mg in the pm 90 tablet 3   No current facility-administered medications for this visit.    Past Medical History  Diagnosis Date  . Mixed hyperlipidemia   . Essential  hypertension, benign   . Depression   . Cellulitis     a. Recurrent, bilateral legs  . Carotid artery disease     a. Bilateral CEA; 50-60% bilateral ICA stenosis, 11/12  . PAD (peripheral artery disease)   . Suicide attempt 08/2002  . TIA (transient ischemic attack)   . TMJ syndrome   . Herniated disc   . Ischemic cardiomyopathy     a. 11/2010 TEE: EF 40-45%.  . Critical lower limb ischemia   . Venous insufficiency   . Coronary atherosclerosis of native coronary artery     a. 11/2010 NSTEMI/CABG x 4: L-LAD; S-PL; S-OM; S-DX, by PVT.  [name] NSTEMI (non-ST elevated myocardial infarction) 11/2010  . Pneumonia 2000's X 2  . Type 2 diabetes mellitus   . History of blood transfusion     "right before hysterectomy" (10/06/2013)  . Migraine     "maybe monthly" (10/06/2013)  . Stroke 08/2009    "occasional weakness or foot drag since if I don't get enough sleep (10/06/2013)  . Arthritis     "knees, hands, neck, back" (10/06/2013)  . Chronic back pain     "neck down"  . On home oxygen therapy     "2L q hs & during the day prn"  . Chronic combined systolic and diastolic CHF (congestive heart failure)     a. 11/2010 TEE: EF 40-45%, no veg or LA thrombus.  . Systolic CHF, acute on chronic 11 2015    Echocardiogram completed November 2015, tricuspid regurg, mild mitral regurgitation, concentric LVH, EF of 55-60%, with mild septal hypokinesis.  Possible diastolic dysfunction.  RV systolic pressure 52.    Past Surgical History  Procedure Laterality Date  . Appendectomy    . Cholecystectomy    . Abdominal hysterectomy    . Carotid endarterectomy Bilateral 2011    Bilateral - Dr. Kellie Simmering  . Wrist fracture surgery Left     "grafted bone from hip to wrist"  . Coronary artery bypass graft  11/24/2010    Procedure: CORONARY ARTERY BYPASS GRAFTING (CABG);  Surgeon: Tharon Aquas Adelene Idler, MD;  Location: [facility];  Service: Open Heart Surgery;  Laterality: N/A;  Coronary Artery Bypass Graft times four on pump  utilizing left internal mammary artery and bilateral greater saphenous veins harvested endoscopically, transesophageal echocardiogram   . Toe surgery Left     "put pin in 2nd toe"  . Lower extremity angiogram  09/08/2012; 10/06/2013    "found 100% blockage; unsuccessful attempt at crossing a chronic total occlusion of the left SFA in the setting of critical limb ischemia  . Dilation and curettage of uterus    . Tubal ligation    . Debridement toe Left     "nonhealing wound; 3rd digit  . Cardiac catheterization  11/2010  . Coronary angioplasty with stent placement  03/2013    "  1"  . Colonoscopy w/ biopsies and polypectomy    . Fracture surgery    . Femoral-popliteal bypass graft Left 10/07/2013    Procedure: LEFT FEMORAL-POPLITEAL ARTERY BYPASS GRAFT;  Surgeon: Angelia Mould, MD;  Location: [facility];  Service: Vascular;  Laterality: Left;  . Intraoperative arteriogram Left 10/07/2013    Procedure: INTRA OPERATIVE ARTERIOGRAM LEFT LEG;  Surgeon: Angelia Mould, MD;  Location: [facility];  Service: Vascular;  Laterality: Left;  . Endarterectomy popliteal Left 10/07/2013    Procedure: LEFT POPLITEAL ENDARTERECTOMY ;  Surgeon: Angelia Mould, MD;  Location: [facility];  Service: Vascular;  Laterality: Left;  . Left and right heart catheterization with coronary/graft angiogram N/A 03/27/2013    Procedure: LEFT AND RIGHT HEART CATHETERIZATION WITH Beatrix Fetters;  Surgeon: Burnell Blanks, MD;  Location[facility] C[facility] LAB;  Service: Cardiovascular;  Laterality: N/A;  . Lower extremity angiogram Bilateral 09/08/2013    Procedure: LOWER EXTREMITY ANGIOGRAM;  Surgeon: [name], MD;  [facility] C[facility];  Service: Cardiovascular;  Laterality: Bilateral;    ROS: Complete review of systems performed and found to be negative unless outlined above  PHYSICAL EXAM BP 158/58 mmHg  Pulse 52  Ht _0  (1.676 m)  Wt 214 lb (97.07 kg)  BMI 34.56 kg/m2 General: Well developed, well  nourished, in no acute distress Head: Eyes PERRLA, No xanthomas.   Normal cephalic and atramatic  Lungs: Diminshed in the bases.  Heart: HRRR S1 S2, tachycardic, without MRG.  Pulses are 2+ & equal.            No carotid bruit. No JVD.  No abdominal bruits. No femoral bruits. Abdomen: Bowel sounds are positive, abdomen soft and non-tender without masses or                  Hernia's noted. Msk:  Back normal, normal gait. Normal strength and tone for age. Extremities: No clubbing, cyanosis or edema.  DP +1 Neuro: Alert and oriented X 3. Psych:  Good affect, responds appropriately  ASSESSMENT AND PLAN

## 2014-01-23 NOTE — Assessment & Plan Note (Signed)
Most recent echocardiogram in [place] demonstrated EF of 55%-60%, with pulmonary hypertension. She continues to have some LEE and low O2 sats despite oxygen supplementation,  I will change lasix to torsemide, 40 mg in am and 20 mg in pm. She is to continue fluid restriction and low sodium diet. Will repeat BMET in 2 weeks and see her again in one month in [place] or [place] office.

## 2014-01-23 NOTE — Assessment & Plan Note (Signed)
Elevated on this office visit. She will continue same medications with the exception of the diuretic. Will make smaller changes at a time and re-evaluate

## 2014-01-23 NOTE — Progress Notes (Deleted)
Name: [name] [name]    DOB: 1945/02/06  Age: 69 y.o.  MR#: [id number]       PCP:  Glenda Chroman., MD      Insurance: Payor: MEDICARE / Plan: MEDICARE PART A AND B / Product Type: *No Product type* /   CC:    Chief Complaint  Patient presents with  . PAD    Stenosis of the mid right SFA  . Hypertension    VS Filed Vitals:   01/23/14 1256  BP: 158/58  Pulse: 52  Height: 5' 6" (1.676 m)  Weight: 214 lb (97.07 kg)    Weights Current Weight  01/23/14 214 lb (97.07 kg)  11/12/13 215 lb 9.6 oz (97.796 kg)  11/10/13 213 lb (96.616 kg)    Blood Pressure  BP Readings from Last 3 Encounters:  01/23/14 158/58  11/12/13 183/50  11/10/13 181/69     Admit date:  (Not on file) Last encounter with RMR:  Visit date not found   Allergy Other; Strawberry; and Sulfa antibiotics  Current Outpatient Prescriptions  Medication Sig Dispense Refill  . amLODipine (NORVASC) 10 MG tablet Take 10 mg by mouth daily.    [name] aspirin 81 MG EC tablet Take 81 mg by mouth daily.    [name] atorvastatin (LIPITOR) 40 MG tablet Take 40 mg by mouth daily at 6 PM.    . carvedilol (COREG) 12.5 MG tablet Take 18.75 mg by mouth 2 (two) times daily.    . citalopram (CELEXA) 20 MG tablet Take 20 mg by mouth daily.    . clopidogrel (PLAVIX) 75 MG tablet Take 75 mg by mouth daily with breakfast.    . Docusate Calcium (STOOL SOFTENER PO) Take 2 capsules by mouth daily as needed (for constipation).    . furosemide (LASIX) 40 MG tablet Take 40 mg by mouth 2 (two) times daily.     [name] glimepiride (AMARYL) 2 MG tablet Take 2 mg by mouth 2 (two) times daily.     [name] HYDROcodone-acetaminophen (NORCO/VICODIN) 5-325 MG per tablet Take 1 tablet by mouth every 6 (six) hours as needed for moderate pain. 30 tablet 0  . insulin aspart (NOVOLOG) 100 UNIT/ML injection Inject 8 Units into the skin 3 (three) times daily with meals.    . insulin detemir (LEVEMIR) 100 UNIT/ML injection Inject 36 Units into the skin at bedtime.     . isosorbide  mononitrate (IMDUR) 30 MG 24 hr tablet Take 30 mg by mouth daily.    [name] lisinopril (PRINIVIL,ZESTRIL) 20 MG tablet Take 20 mg by mouth daily.    . Multiple Vitamin (MULTIVITAMIN WITH MINERALS) TABS tablet Take 1 tablet by mouth daily.    . nitroGLYCERIN (NITROSTAT) 0.4 MG SL tablet Place 1 tablet (0.4 mg total) under the tongue every 5 (five) minutes as needed for chest pain. Up to 3 doses. If no relief after 3rd dose, proceed to the ED for an evaluation 25 tablet 3  . potassium chloride (K-DUR) 10 MEQ tablet Take 10 mEq by mouth daily.    [name] PRESCRIPTION MEDICATION 2 liters of Oxygen     No current facility-administered medications for this visit.    Discontinued Meds:    Medications Discontinued During This Encounter  Medication Reason  . levofloxacin (LEVAQUIN) 500 MG tablet Error  . oxyCODONE-acetaminophen (PERCOCET/ROXICET) 5-325 MG per tablet Error    Patient Active Problem List   Diagnosis Date Noted  . PVD (peripheral vascular disease) 11/10/2013  . Critical lower limb ischemia 10/06/2013  .  Noncompliance 04/17/2013  . Chronic combined systolic and diastolic heart failure 69/62/9528  . Diabetes mellitus type II, uncontrolled 01/13/2011  . Coronary atherosclerosis of native coronary artery   . PAD (peripheral artery disease)   . Carotid artery disease   . Hypertension, uncontrolled 01/14/2010  . Mixed hyperlipidemia 11/11/2009  . TOBACCO ABUSE 11/11/2009    LABS    Component Value Date/Time   NA 139 10/08/2013 0450   NA 140 10/07/2013 0344   NA 137 10/06/2013 0911   K 4.1 10/08/2013 0450   K 4.5 10/07/2013 0344   K 4.4 10/06/2013 0911   CL 105 10/08/2013 0450   CL 104 10/07/2013 0344   CL 100 10/06/2013 0911   CO2 25 10/08/2013 0450   CO2 26 10/07/2013 0344   CO2 27 10/06/2013 0911   GLUCOSE 118* 10/08/2013 0450   GLUCOSE 111* 10/07/2013 0344   GLUCOSE 73 10/06/2013 0911   BUN 23 10/08/2013 0450   BUN 26* 10/07/2013 0344   BUN 30* 10/06/2013 0911    CREATININE 0.96 10/08/2013 0450   CREATININE 1.05 10/07/2013 0344   CREATININE 0.97 10/06/2013 0911   CALCIUM 8.0* 10/08/2013 0450   CALCIUM 8.6 10/07/2013 0344   CALCIUM 9.1 10/06/2013 0911   GFRNONAA 60* 10/08/2013 0450   GFRNONAA 54* 10/07/2013 0344   GFRNONAA 60* 10/06/2013 0911   GFRAA 70* 10/08/2013 0450   GFRAA 63* 10/07/2013 0344   GFRAA 69* 10/06/2013 0911   CMP     Component Value Date/Time   NA 139 10/08/2013 0450   K 4.1 10/08/2013 0450   CL 105 10/08/2013 0450   CO2 25 10/08/2013 0450   GLUCOSE 118* 10/08/2013 0450   BUN 23 10/08/2013 0450   CREATININE 0.96 10/08/2013 0450   CALCIUM 8.0* 10/08/2013 0450   PROT 6.4 03/19/2013 0151   ALBUMIN 2.6* 03/19/2013 0151   AST 26 03/19/2013 0151   ALT 26 03/19/2013 0151   ALKPHOS 98 03/19/2013 0151   BILITOT 0.3 03/19/2013 0151   GFRNONAA 60* 10/08/2013 0450   GFRAA 70* 10/08/2013 0450       Component Value Date/Time   WBC 8.3 10/08/2013 0450   WBC 7.1 10/07/2013 0344   WBC 6.5 10/06/2013 0911   HGB 8.9* 10/08/2013 0450   HGB 10.5* 10/07/2013 0344   HGB 11.2* 10/06/2013 0911   HCT 28.3* 10/08/2013 0450   HCT 32.7* 10/07/2013 0344   HCT 34.0* 10/06/2013 0911   MCV 87.6 10/08/2013 0450   MCV 84.9 10/07/2013 0344   MCV 83.3 10/06/2013 0911    Lipid Panel     Component Value Date/Time   CHOL 114 10/13/2010 0401   TRIG 96 10/13/2010 0401   HDL 31* 10/13/2010 0401   CHOLHDL 3.7 10/13/2010 0401   VLDL 19 10/13/2010 0401   LDLCALC 64 10/13/2010 0401    ABG    Component Value Date/Time   PHART 7.450 03/27/2013 1114   PCO2ART 48.7* 03/27/2013 1114   PO2ART 123.0* 03/27/2013 1114   HCO3 34.9* 03/27/2013 1119   TCO2 37 03/27/2013 1119   ACIDBASEDEF 1.0 11/24/2010 2205   O2SAT 65.0 03/27/2013 1119     Lab Results  Component Value Date   TSH 3.001 03/18/2013   BNP (last 3 results)  Recent Labs  03/20/13 0341  PROBNP 2927.0*   Cardiac Panel (last 3 results) No results for input(s): CKTOTAL, CKMB,  TROPONINI, RELINDX in the last 72 hours.  Iron/TIBC/Ferritin/ %Sat No results found for: IRON, TIBC, FERRITIN, IRONPCTSAT   EKG  Orders placed or performed during the hospital encounter of 09/08/13  . EKG     Prior Assessment and Plan Problem List as of 01/23/2014      Cardiovascular and Mediastinum   PAD (peripheral artery disease)   Last Assessment & Plan 10/02/2013 Office Visit Edited 10/02/2013  4:44 PM by [name], MD    [name] has critical limb ischemia. I intraventricular H./31/15 revealing an occluded SFA the origin with reconstitution in Hunter's canal two-vessel runoff. I initially sent her to Dr. Scot Dock for consideration of surgical revascularization however vein mapping revealed no suitable venous conduit and she would need PTFE which I do not think his optimal I discussed the case with Dr. Doren Custard and we've agreed to attempt percutaneous catheterization and reserve surgical requisition for failure of the percutaneous approach.      Coronary atherosclerosis of native coronary artery   Last Assessment & Plan 07/30/2013 Office Visit Written 07/30/2013  9:29 AM by Satira Sark, MD    Agree with recent addition of Imdur to her regimen. Will increase Coreg to 12.5 mg one and a half tablets twice daily for now. This can be further advanced depending on heart rate and blood pressure. Also given prescription for sublingual nitroglycerin. Plavix was refilled as well. Plan to continue medical therapy unless symptoms escalate. Followup scheduled for 3 months.      Hypertension, uncontrolled   Last Assessment & Plan 10/31/2013 Office Visit Written 10/31/2013  3:45 PM by [name], MD    Her blood pressure is elevated today, higher than what it usually runs when she measures it at home      Carotid artery disease   Last Assessment & Plan 10/31/2013 Office Visit Written 10/31/2013  3:45 PM by [name], MD    History of bilateral carotid endarterectomies remotely  with recent carotid Dopplers performed several months ago that showed moderate bilateral ICA stenosis but otherwise patent endarterectomy sites.      Chronic combined systolic and diastolic heart failure   Last Assessment & Plan 07/30/2013 Office Visit Written 07/30/2013  9:30 AM by Satira Sark, MD    No change in current diuretic regimen.      Critical lower limb ischemia   Last Assessment & Plan 10/31/2013 Office Visit Written 10/31/2013  3:44 PM by [name], MD    I performed angiography on the [place] on 8/31 20/15 15 revealed a 90% mid right SFA stenosis with one vessel runoff via the peroneal and an occluded left SFA at its origin with reconstitution in the above-knee popliteal artery with 2 vessel runoff. On 09/28/13 I attempted to percutaneously revascularize her left SFA on successfully and the following day she underwent left common femoral to below-the-knee popliteal artery bypass with a 6 mm PTFE graft by Dr. Gae Gallop. She was discharged several days later with an operative result. Her surgical wounds are healed. She still has a small sore on the plantar aspect of her left foot. She does complain of right calf claudication and is a candidate for percutaneous revascularization at this point will wait and allow her to recover from her recent operation.      PVD (peripheral vascular disease)     Other   Diabetes mellitus type II, uncontrolled   Last Assessment & Plan 01/13/2011 Office Visit Written 01/13/2011  4:14 PM by Donney Dice, PA    Followed by Dr. Woody Seller      Mixed hyperlipidemia   Last  Assessment & Plan 10/31/2013 Office Visit Written 10/31/2013  3:45 PM by [name], MD    On statin therapy, followed by her PCP      TOBACCO ABUSE   Noncompliance   Last Assessment & Plan 04/17/2013 Office Visit Written 04/17/2013  3:26 PM by Satira Sark, MD    Addressed this again today. Patient states she has been taking her medications.          Imaging: No  results found.

## 2014-01-23 NOTE — Assessment & Plan Note (Signed)
She is counseled on this, especially with O2 dependence. I will also refer to Dr. Luan Pulling, pulmonologist for O2 management and further evaluation and treatment of lung disease. I think it is important that she be connected with pulmonology.

## 2014-01-23 NOTE — Patient Instructions (Addendum)
Your physician recommends that you schedule a follow-up appointment in: in 1 month     STOP Lasix   START Torsemide, take 40 mg in the am ,and 20 mg in the pm    Please get lab work in 2 weeks    We have referred you to Dr.Hawkins pulmonologist   [phone]      Thank you for choosing [facility] !

## 2014-01-29 ENCOUNTER — Telehealth: Payer: Self-pay | Admitting: *Deleted

## 2014-01-29 NOTE — Telephone Encounter (Signed)
Pt is still retaining fluid even with new medication changes. No weight change at all

## 2014-01-29 NOTE — Telephone Encounter (Signed)
If she is not having symptoms of dyspnea or pain, mild LEE is ok. This may be related to use of nitrates and salt intake. Wt is not increased. Continue to watch and wait. Decrease salt intake.

## 2014-01-29 NOTE — Telephone Encounter (Signed)
12:18  LMTCB -cc

## 2014-02-02 NOTE — Telephone Encounter (Signed)
Pt has lost 3 lbs and feels better

## 2014-02-03 ENCOUNTER — Encounter: Payer: Self-pay | Admitting: Cardiology

## 2014-02-03 ENCOUNTER — Encounter: Payer: Medicare Other | Admitting: Cardiology

## 2014-02-03 NOTE — Progress Notes (Signed)
No show  This encounter was created in error - please disregard.

## 2014-02-05 ENCOUNTER — Encounter: Payer: Self-pay | Admitting: Cardiology

## 2014-02-10 ENCOUNTER — Telehealth: Payer: Self-pay

## 2014-02-10 NOTE — Telephone Encounter (Signed)
-----  Message from Satira Sark, MD sent at 02/09/2014  9:20 AM EST ----- Patient was most recently seen by Ms. Lawrence NP, and this lab work was ordered. She was changed from Lasix to Demadex 40 mg in the morning and 20 mg in the evening at that time, and previous creatinine was 0.98. At present creatinine up to 1.4, potassium 5.5. Would have her cut Demadex back to 40 mg in the morning only, and ensure that KCl is no higher than 10 mEq daily. She may have missed her follow-up visit. Please check on this.

## 2014-02-10 NOTE — Telephone Encounter (Signed)
2/1,2/2   LMTCB

## 2014-02-13 ENCOUNTER — Other Ambulatory Visit (HOSPITAL_COMMUNITY): Payer: Self-pay | Admitting: Cardiovascular Disease

## 2014-02-13 ENCOUNTER — Telehealth: Payer: Self-pay

## 2014-02-13 DIAGNOSIS — I6523 Occlusion and stenosis of bilateral carotid arteries: Secondary | ICD-10-CM

## 2014-02-13 MED ORDER — TORSEMIDE 20 MG PO TABS: ORAL_TABLET | ORAL | Status: DC

## 2014-02-13 NOTE — Telephone Encounter (Signed)
-----  Message from Satira Sark, MD sent at 02/09/2014  9:20 AM EST ----- Patient was most recently seen by Ms. Lawrence NP, and this lab work was ordered. She was changed from Lasix to Demadex 40 mg in the morning and 20 mg in the evening at that time, and previous creatinine was 0.98. At present creatinine up to 1.4, potassium 5.5. Would have her cut Demadex back to 40 mg in the morning only, and ensure that KCl is no higher than 10 mEq daily. She may have missed her follow-up visit. Please check on this.

## 2014-02-13 NOTE — Telephone Encounter (Signed)
Pt will reduce Demadex to 40 mg daily and confirmed she is only taking KCL 10 meq daily. Has fu apt 02/25/14 with Dr.McDowell

## 2014-02-25 ENCOUNTER — Ambulatory Visit (INDEPENDENT_AMBULATORY_CARE_PROVIDER_SITE_OTHER): Payer: Medicare Other | Admitting: Cardiology

## 2014-02-25 ENCOUNTER — Encounter: Payer: Self-pay | Admitting: Cardiology

## 2014-02-25 VITALS — BP 118/60 | HR 52 | Ht 66.0 in | Wt 197.0 lb

## 2014-02-25 DIAGNOSIS — I6523 Occlusion and stenosis of bilateral carotid arteries: Secondary | ICD-10-CM

## 2014-02-25 DIAGNOSIS — I2510 Atherosclerotic heart disease of native coronary artery without angina pectoris: Secondary | ICD-10-CM

## 2014-02-25 DIAGNOSIS — I739 Peripheral vascular disease, unspecified: Secondary | ICD-10-CM

## 2014-02-25 DIAGNOSIS — I10 Essential (primary) hypertension: Secondary | ICD-10-CM

## 2014-02-25 DIAGNOSIS — I5033 Acute on chronic diastolic (congestive) heart failure: Secondary | ICD-10-CM

## 2014-02-25 NOTE — Patient Instructions (Signed)
Your physician recommends that you schedule a follow-up appointment in: 6 weeks   Your physician recommends that you continue on your current medications as directed. Please refer to the Current Medication list given to you today.  Your physician recommends that you return for lab work in: [place])  Thank you for choosing [facility]!

## 2014-02-25 NOTE — Progress Notes (Signed)
Cardiology Office Note  Date: 02/25/2014   ID: [name] [name], DOB 01/15/1945, MRN [id number]  PCP: Glenda Chroman., MD  Primary Cardiologist: Rozann Lesches, MD   Chief Complaint  Patient presents with  . Coronary Artery Disease  . Cardiomyopathy  . PAD    History of Present Illness: [name] [name] is a medically complex 69 y.o. female presenting for an office follow-up, last seen by Ms. Lawrence NP in January. She is here with her daughter. Overall, seems to be doing much better in terms of fluid status. Leg swelling and abdominal girth are improving although not completely at baseline.  Since last visit, recommendation was made to reduce Demadex to 40 mg in the morning and continue KCl at 10 mEq daily. BMET from January is noted below. Weight is down approximately 15 pounds compared to January.  Patient follows concurrently with Dr. Gwenlyn Found and Dr. Scot Dock, has a history of PAD status post left common femoral artery to below the knee popliteal artery bypass. She has follow-up imaging studies with visits over the next few months.  She does not endorse any angina symptoms.  She continues on home oxygen, and will be establishing follow-up with Dr. Luan Pulling next week as well for pulmonary management.   Past Medical History  Diagnosis Date  . Mixed hyperlipidemia   . Essential hypertension, benign   . Depression   . Cellulitis     a. Recurrent, bilateral legs  . Carotid artery disease     a. Bilateral CEA; 50-60% bilateral ICA stenosis, 11/12  . PAD (peripheral artery disease)   . Suicide attempt 08/2002  . TIA (transient ischemic attack)   . TMJ syndrome   . Herniated disc   . Ischemic cardiomyopathy     a. 11/2010 TEE: EF 40-45%.  . Critical lower limb ischemia   . Venous insufficiency   . Coronary atherosclerosis of native coronary artery     a. 11/2010 NSTEMI/CABG x 4: L-LAD; S-PL; S-OM; S-DX, by PVT.  [name] NSTEMI (non-ST elevated myocardial infarction) 11/2010  .  Pneumonia 2000's X 2  . Type 2 diabetes mellitus   . History of blood transfusion   . Migraine   . Stroke 08/2009  . Arthritis   . Chronic back pain   . On home oxygen therapy   . Chronic diastolic heart failure     Past Surgical History  Procedure Laterality Date  . Appendectomy    . Cholecystectomy    . Abdominal hysterectomy    . Carotid endarterectomy Bilateral 2011    Bilateral - Dr. Kellie Simmering  . Wrist fracture surgery Left     "grafted bone from hip to wrist"  . Coronary artery bypass graft  11/24/2010    Procedure: CORONARY ARTERY BYPASS GRAFTING (CABG);  Surgeon: Tharon Aquas Adelene Idler, MD;  Location: [place];  Service: Open Heart Surgery;  Laterality: N/A;  Coronary Artery Bypass Graft times four on pump utilizing left internal mammary artery and bilateral greater saphenous veins harvested endoscopically, transesophageal echocardiogram   . Toe surgery Left     "put pin in 2nd toe"  . Lower extremity angiogram  09/08/2012; 10/06/2013    "found 100% blockage; unsuccessful attempt at crossing a chronic total occlusion of the left SFA in the setting of critical limb ischemia  . Dilation and curettage of uterus    . Tubal ligation    . Debridement toe Left     "nonhealing wound; 3rd digit  . Cardiac catheterization  11/2010  .  Coronary angioplasty with stent placement  03/2013    "1"  . Colonoscopy w/ biopsies and polypectomy    . Fracture surgery    . Femoral-popliteal bypass graft Left 10/07/2013    Procedure: LEFT FEMORAL-POPLITEAL ARTERY BYPASS GRAFT;  Surgeon: Angelia Mould, MD;  Location: [place];  Service: Vascular;  Laterality: Left;  . Intraoperative arteriogram Left 10/07/2013    Procedure: INTRA OPERATIVE ARTERIOGRAM LEFT LEG;  Surgeon: Angelia Mould, MD;  Location: [place];  Service: Vascular;  Laterality: Left;  . Endarterectomy popliteal Left 10/07/2013    Procedure: LEFT POPLITEAL ENDARTERECTOMY ;  Surgeon: Angelia Mould, MD;  Location: [place];  Service:  Vascular;  Laterality: Left;  . Left and right heart catheterization with coronary/graft angiogram N/A 03/27/2013    Procedure: LEFT AND RIGHT HEART CATHETERIZATION WITH Beatrix Fetters;  Surgeon: Burnell Blanks, MD;  Location: [facility] CATH LAB;  Service: Cardiovascular;  Laterality: N/A;  . Lower extremity angiogram Bilateral 09/08/2013    Procedure: LOWER EXTREMITY ANGIOGRAM;  Surgeon: [name], MD;  Location: [facility] CATH LAB;  Service: Cardiovascular;  Laterality: Bilateral;    Current Outpatient Prescriptions  Medication Sig Dispense Refill  . amLODipine (NORVASC) 10 MG tablet Take 10 mg by mouth daily.    [name] aspirin 81 MG EC tablet Take 81 mg by mouth daily.    [name] atorvastatin (LIPITOR) 40 MG tablet Take 40 mg by mouth daily at 6 PM.    . carvedilol (COREG) 12.5 MG tablet Take 18.75 mg by mouth 2 (two) times daily.    . citalopram (CELEXA) 20 MG tablet Take 20 mg by mouth daily.    . clopidogrel (PLAVIX) 75 MG tablet Take 75 mg by mouth daily with breakfast.    . Docusate Calcium (STOOL SOFTENER PO) Take 2 capsules by mouth daily as needed (for constipation).    [name] glimepiride (AMARYL) 2 MG tablet Take 2 mg by mouth 2 (two) times daily.     [name] HYDROcodone-acetaminophen (NORCO/VICODIN) 5-325 MG per tablet Take 1 tablet by mouth every 6 (six) hours as needed for moderate pain. 30 tablet 0  . insulin aspart (NOVOLOG) 100 UNIT/ML injection Inject 8 Units into the skin 3 (three) times daily with meals.    . insulin detemir (LEVEMIR) 100 UNIT/ML injection Inject 36 Units into the skin at bedtime.     . isosorbide mononitrate (IMDUR) 30 MG 24 hr tablet Take 30 mg by mouth daily.    [name] lisinopril (PRINIVIL,ZESTRIL) 20 MG tablet Take 20 mg by mouth daily.    . Multiple Vitamin (MULTIVITAMIN WITH MINERALS) TABS tablet Take 1 tablet by mouth daily.    . nitroGLYCERIN (NITROSTAT) 0.4 MG SL tablet Place 1 tablet (0.4 mg total) under the tongue every 5 (five) minutes as needed for chest pain.  Up to 3 doses. If no relief after 3rd dose, proceed to the ED for an evaluation 25 tablet 3  . potassium chloride (K-DUR) 10 MEQ tablet Take 10 mEq by mouth daily.    [name] PRESCRIPTION MEDICATION 2 liters of Oxygen    . torsemide (DEMADEX) 20 MG tablet Take 40 mg in the am only per SM 02/13/14 90 tablet 3   No current facility-administered medications for this visit.    Allergies:  Other; Strawberry; and Sulfa antibiotics   Social History: The patient  reports that she quit smoking about 4 years ago. Her smoking use included Cigarettes. She started smoking about 58 years ago. She has a 150 pack-year  smoking history. She has never used smokeless tobacco. She reports that she drinks alcohol. She reports that she uses illicit drugs (Marijuana).   ROS:  Please see the history of present illness. Otherwise, complete review of systems is positive for none.  All other systems are reviewed and negative.    Physical Exam: VS:  BP 118/60 mmHg  Pulse 52  Ht _0  (1.676 m)  Wt 197 lb (89.359 kg)  BMI 31.81 kg/m2  SpO2 95%, BMI Body mass index is 31.81 kg/(m^2).  Wt Readings from Last 3 Encounters:  02/25/14 197 lb (89.359 kg)  01/23/14 214 lb (97.07 kg)  11/12/13 215 lb 9.6 oz (97.796 kg)     Overweight woman, appears comfortable at rest. Chronically ill-appearing. HEENT: Conjunctiva and lids normal, oropharynx clear. Neck: Supple, no elevated JVP, bilateral CEA scars, no thyromegaly. Lungs: Clear to auscultation with decreased breath sounds, nonlabored breathing at rest. Cardiac: Regular rate and rhythm, no S3, soft systolic murmur, no pericardial rub. Abdomen: Soft, nontender, protuberant, bowel sounds present, no guarding or rebound. Extremities: Chronic appearing edema and stasis dermatitis, distal pulses 1+. Skin: Warm and dry. Drying and flaking noted mid to distal legs. Musculoskeletal: No kyphosis. Neuropsychiatric: Alert and oriented x3, affect grossly appropriate.   ECG: ECG is not  ordered today.  Recent Labwork:  Lab work from January 2016 showed BUN 23, creatinine 1.4, potassium 5.5, hemoglobin 9.3, platelets 197  Other Studies Reviewed Today:  1. Echocardiogram 03/18/2013: Study Conclusions  - Left ventricle: The cavity size was moderately dilated. Wall thickness was normal. The estimated ejection fraction was 30%. Diffuse hypokinesis. - Left atrium: The atrium was moderately dilated. - Atrial septum: No defect or patent foramen ovale was identified. - Pulmonary arteries: PA peak pressure: 105m Hg (S). - Impressions: Elevated LA pressure by E/E' Impressions:  - Elevated LA pressure by E/E'.  [facility] LVEF 55-60% with septal hypokinesis, possible diastolic dysfunction, and RVSP 52 mmHg.  ASSESSMENT AND PLAN:  1. Multivessel CAD status post CABG. She is symptomatically stable at this time on aspirin, Norvasc, Coreg, Plavix, Lipitor, and lisinopril. No recent nitroglycerin use.  2. Acute on chronic diastolic heart failure, [facility]VE[facility] She has had significant improvement in volume status on Demadex, weight down 15 pounds. We will recheck BMET to reassess renal function, for now continue Demadex at 40 mg daily.  3. PAD status post left common femoral artery to below the knee popliteal bypass grafting and CEAs..Marland KitchenKeep follow-up with Dr. BGwenlyn Foundand Dr. DScot Dock  4. Essential hypertension, blood pressure normal range today.   Current medicines are reviewed at length with the patient today.  The patient does not have concerns regarding medicines.  Orders Placed This Encounter  Procedures  . Basic Metabolic Panel (BMET)   Disposition: FU with me in 6 weeks.   Signed, SSatira Sark MD, [facility]02/25/2014 4:58 PM    [facility] [place] [address]Phone: [phone] Fax: [phone]

## 2014-02-26 LAB — BASIC METABOLIC PANEL
BUN: 26 mg/dL — AB (ref 6–23)
CALCIUM: 9 mg/dL (ref 8.4–10.5)
CHLORIDE: 99 meq/L (ref 96–112)
CO2: 29 meq/L (ref 19–32)
CREATININE: 1.06 mg/dL (ref 0.50–1.10)
GLUCOSE: 125 mg/dL — AB (ref 70–99)
POTASSIUM: 3.9 meq/L (ref 3.5–5.3)
Sodium: 138 mEq/L (ref 135–145)

## 2014-03-10 ENCOUNTER — Ambulatory Visit (HOSPITAL_COMMUNITY)
Admission: RE | Admit: 2014-03-10 | Discharge: 2014-03-10 | Disposition: A | Discharge status: Home / Self Care | Payer: Medicare Other | Source: Ambulatory Visit | Attending: Cardiology | Admitting: Cardiology

## 2014-03-10 DIAGNOSIS — I6523 Occlusion and stenosis of bilateral carotid arteries: Secondary | ICD-10-CM | POA: Diagnosis present

## 2014-03-10 NOTE — Progress Notes (Signed)
Carotid Duplex Completed. [place]

## 2014-03-16 ENCOUNTER — Other Ambulatory Visit: Payer: Self-pay | Admitting: *Deleted

## 2014-03-16 MED ORDER — CLOPIDOGREL BISULFATE 75 MG PO TABS: 75.0000 mg | ORAL_TABLET | Freq: Every day | ORAL | Status: DC

## 2014-03-16 NOTE — Telephone Encounter (Signed)
Pt needs clopdogrel called in to [facility] in [place]. Pt did call pharmacy and they told her to call us/tmj

## 2014-03-16 NOTE — Telephone Encounter (Signed)
rx escribed as requested

## 2014-03-17 ENCOUNTER — Telehealth: Payer: Self-pay | Admitting: *Deleted

## 2014-03-17 DIAGNOSIS — I6523 Occlusion and stenosis of bilateral carotid arteries: Secondary | ICD-10-CM

## 2014-03-17 NOTE — Telephone Encounter (Signed)
-----  Message from [name], MD sent at 03/12/2014  4:09 PM EST ----- No change from prior study. Repeat in 6 months

## 2014-03-17 NOTE — Telephone Encounter (Signed)
Carotid doppler results released to MyChart.  Order placed for recheck in 6 months.

## 2014-03-24 ENCOUNTER — Telehealth: Payer: Self-pay | Admitting: *Deleted

## 2014-03-24 NOTE — Telephone Encounter (Signed)
Notified Christyne at [facility] of MD recommendations will call back next week with update

## 2014-03-24 NOTE — Telephone Encounter (Signed)
Temporarily increase torsemide to 40 mg in the morning and 20 mg in the evening for the next week. This should help get weight back down toward prior baseline.

## 2014-03-24 NOTE — Telephone Encounter (Signed)
Pt weight was 208 yesterday and normal weight is 196-199. Recently been taking torsimide 20 mg two times a day. Wondering if we need to increase this again to bring the weight down/tmj

## 2014-03-24 NOTE — Telephone Encounter (Signed)
Will forward to Dr.McDowell wt at last visit 197 lbs

## 2014-04-15 ENCOUNTER — Ambulatory Visit (INDEPENDENT_AMBULATORY_CARE_PROVIDER_SITE_OTHER): Payer: Medicare Other | Admitting: Cardiology

## 2014-04-15 ENCOUNTER — Encounter: Payer: Self-pay | Admitting: Cardiology

## 2014-04-15 VITALS — BP 126/70 | HR 51

## 2014-04-15 DIAGNOSIS — E782 Mixed hyperlipidemia: Secondary | ICD-10-CM | POA: Diagnosis not present

## 2014-04-15 DIAGNOSIS — I10 Essential (primary) hypertension: Secondary | ICD-10-CM | POA: Diagnosis not present

## 2014-04-15 DIAGNOSIS — I2510 Atherosclerotic heart disease of native coronary artery without angina pectoris: Secondary | ICD-10-CM | POA: Diagnosis not present

## 2014-04-15 DIAGNOSIS — I6523 Occlusion and stenosis of bilateral carotid arteries: Secondary | ICD-10-CM | POA: Diagnosis not present

## 2014-04-15 NOTE — Progress Notes (Signed)
Cardiology Office Note  Date: 04/15/2014   ID: [name] [name], DOB [birth year], MRN [id number]  PCP: Glenda Chroman., MD  Primary Cardiologist: Rozann Lesches, MD   Chief Complaint  Patient presents with  . Coronary Artery Disease  . Diastolic heart failure  . Hypertension    History of Present Illness: [name] [name] is a 70 y.o. female last seen in February. She is here with her daughter today. She reports having had interval diagnosis of right-sided pneumonia since I last saw her treated with outpatient antibiotics. She is following with Dr. Luan Pulling and has a pending chest x-ray. She still has some shortness of breath and feeling of chest congestion. Otherwise, no angina symptoms. She reports compliance with her medications which are outlined below.  She just recently moved in with her son, has more company at home. Seems to be more active according to her daughter. Weight has been relatively stable.  Blood pressure looks very well controlled today, but she does tell me that tends to run high when she checks it in the mornings after getting up. She does use oxygen continuously, no definite history of OSA or CPAP use.   Past Medical History  Diagnosis Date  . Mixed hyperlipidemia   . Essential hypertension, benign   . Depression   . Cellulitis     a. Recurrent, bilateral legs  . Carotid artery disease     a. Bilateral CEA; 50-60% bilateral ICA stenosis, 11/12  . PAD (peripheral artery disease)   . Suicide attempt 08/2002  . TIA (transient ischemic attack)   . TMJ syndrome   . Herniated disc   . Ischemic cardiomyopathy     a. 11/2010 TEE: EF 40-45%.  . Critical lower limb ischemia   . Venous insufficiency   . Coronary atherosclerosis of native coronary artery     a. 11/2010 NSTEMI/CABG x 4: L-LAD; S-PL; S-OM; S-DX, by PVT.  [name] NSTEMI (non-ST elevated myocardial infarction) 11/2010  . Pneumonia 2000's X 2  . Type 2 diabetes mellitus   . History of blood transfusion   .  Migraine   . Stroke 08/2009  . Arthritis   . Chronic back pain   . On home oxygen therapy   . Chronic diastolic heart failure     Past Surgical History  Procedure Laterality Date  . Appendectomy    . Cholecystectomy    . Abdominal hysterectomy    . Carotid endarterectomy Bilateral 2011    Bilateral - Dr. Kellie Simmering  . Wrist fracture surgery Left     "grafted bone from hip to wrist"  . Coronary artery bypass graft  11/24/2010    Procedure: CORONARY ARTERY BYPASS GRAFTING (CABG);  Surgeon: Tharon Aquas Adelene Idler, MD;  Location: [place];  Service: Open Heart Surgery;  Laterality: N/A;  Coronary Artery Bypass Graft times four on pump utilizing left internal mammary artery and bilateral greater saphenous veins harvested endoscopically, transesophageal echocardiogram   . Toe surgery Left     "put pin in 2nd toe"  . Lower extremity angiogram  09/08/2012; 10/06/2013    "found 100% blockage; unsuccessful attempt at crossing a chronic total occlusion of the left SFA in the setting of critical limb ischemia  . Dilation and curettage of uterus    . Tubal ligation    . Debridement toe Left     "nonhealing wound; 3rd digit  . Cardiac catheterization  11/2010  . Coronary angioplasty with stent placement  03/2013    "1"  .  Colonoscopy w/ biopsies and polypectomy    . Fracture surgery    . Femoral-popliteal bypass graft Left 10/07/2013    Procedure: LEFT FEMORAL-POPLITEAL ARTERY BYPASS GRAFT;  Surgeon: Angelia Mould, MD;  Location: [place];  Service: Vascular;  Laterality: Left;  . Intraoperative arteriogram Left 10/07/2013    Procedure: INTRA OPERATIVE ARTERIOGRAM LEFT LEG;  Surgeon: Angelia Mould, MD;  Location: [place];  Service: Vascular;  Laterality: Left;  . Endarterectomy popliteal Left 10/07/2013    Procedure: LEFT POPLITEAL ENDARTERECTOMY ;  Surgeon: Angelia Mould, MD;  Location: [place];  Service: Vascular;  Laterality: Left;  . Left and right heart catheterization with  coronary/graft angiogram N/A 03/27/2013    Procedure: LEFT AND RIGHT HEART CATHETERIZATION WITH Beatrix Fetters;  Surgeon: Burnell Blanks, MD;  Location: [facility] CATH LAB;  Service: Cardiovascular;  Laterality: N/A;  . Lower extremity angiogram Bilateral 09/08/2013    Procedure: LOWER EXTREMITY ANGIOGRAM;  Surgeon: [name], MD;  [facility] C[facility];  Service: Cardiovascular;  Laterality: Bilateral;    Current Outpatient Prescriptions  Medication Sig Dispense Refill  . albuterol (PROVENTIL HFA;VENTOLIN HFA) 108 (90 BASE) MCG/ACT inhaler Inhale 2 puffs into the lungs as needed for wheezing or shortness of breath.    [name] amLODipine (NORVASC) 10 MG tablet Take 10 mg by mouth daily.    [name] aspirin 81 MG EC tablet Take 81 mg by mouth daily.    [name] atorvastatin (LIPITOR) 40 MG tablet Take 40 mg by mouth daily at 6 PM.    . carvedilol (COREG) 12.5 MG tablet Take 18.75 mg by mouth 2 (two) times daily.    . citalopram (CELEXA) 20 MG tablet Take 20 mg by mouth daily.    . clopidogrel (PLAVIX) 75 MG tablet Take 1 tablet (75 mg total) by mouth daily with breakfast. 30 tablet 9  . Docusate Calcium (STOOL SOFTENER PO) Take 2 capsules by mouth daily as needed (for constipation).    [name] glimepiride (AMARYL) 2 MG tablet Take 2 mg by mouth 2 (two) times daily.     [name] HYDROcodone-acetaminophen (NORCO/VICODIN) 5-325 MG per tablet Take 1 tablet by mouth every 6 (six) hours as needed for moderate pain. 30 tablet 0  . insulin aspart (NOVOLOG) 100 UNIT/ML injection Inject 8 Units into the skin 3 (three) times daily with meals.    . insulin detemir (LEVEMIR) 100 UNIT/ML injection Inject 22 Units into the skin at bedtime.     . isosorbide mononitrate (IMDUR) 30 MG 24 hr tablet Take 30 mg by mouth daily.    [name] lisinopril (PRINIVIL,ZESTRIL) 20 MG tablet Take 20 mg by mouth daily.    . Multiple Vitamin (MULTIVITAMIN WITH MINERALS) TABS tablet Take 1 tablet by mouth daily.    . nitroGLYCERIN (NITROSTAT) 0.4 MG SL  tablet Place 1 tablet (0.4 mg total) under the tongue every 5 (five) minutes as needed for chest pain. Up to 3 doses. If no relief after 3rd dose, proceed to the ED for an evaluation 25 tablet 3  . potassium chloride (K-DUR) 10 MEQ tablet Take 10 mEq by mouth daily.    [name] PRESCRIPTION MEDICATION 2 liters of Oxygen    . torsemide (DEMADEX) 20 MG tablet Take 40 mg in the am only per SM 02/13/14 90 tablet 3  . Umeclidinium-Vilanterol 62.5-25 MCG/INH AEPB Inhale into the lungs daily.     No current facility-administered medications for this visit.    Allergies:  Other; Strawberry; and Sulfa antibiotics  Social History: The patient  reports that she quit smoking about 4 years ago. Her smoking use included Cigarettes. She started smoking about 58 years ago. She has a 150 pack-year smoking history. She has never used smokeless tobacco. She reports that she drinks alcohol. She reports that she uses illicit drugs (Marijuana).   ROS:  Please see the history of present illness. Otherwise, complete review of systems is positive for some chest congestion but no fevers or chills, no cough.  All other systems are reviewed and negative.   Physical Exam: VS:  BP 126/70 mmHg  Pulse 51  SpO2 90%, BMI There is no weight on file to calculate BMI.  Wt Readings from Last 3 Encounters:  02/25/14 197 lb (89.359 kg)  01/23/14 214 lb (97.07 kg)  11/12/13 215 lb 9.6 oz (97.796 kg)     Overweight woman, appears comfortable at rest. Wearing oxygen. HEENT: Conjunctiva and lids normal, oropharynx clear. Neck: Supple, no elevated JVP, bilateral CEA scars, no thyromegaly. Lungs: Clear to auscultation with decreased breath sounds with crackles at the right lower to midlung zone, nonlabored breathing at rest. Cardiac: Regular rate and rhythm, no S3, soft systolic murmur, no pericardial rub. Abdomen: Soft, nontender, protuberant, bowel sounds present, no guarding or rebound. Extremities: Chronic appearing edema, distal  pulses 1+. Skin: Warm and dry. Drying and flaking noted mid to distal legs. Musculoskeletal: No kyphosis. Neuropsychiatric: Alert and oriented x3, affect grossly appropriate.   ECG: ECG is not ordered today.  Recent Labwork: 10/08/2013: Hemoglobin 8.9*; Platelets 159 02/25/2014: BUN 26*; Creatinine 1.06; Potassium 3.9; Sodium 138   Other Studies Reviewed Today:  Echocardiogram from [facility] November 2015 reported LVEF 55-60% with septal hypokinesis, possible diastolic dysfunction, and RVSP 52 mmHg.  ASSESSMENT AND PLAN:  1. CAD status post CABG in 2012. LVEF normal by echocardiogram in November of last year. Plan is to continue medical therapy at this time and continue observation.  2. Reported interval diagnosis is pneumonia, followed by Dr. Luan Pulling. She does have decreased breath sounds with crackles at the right mid to lower lung zone. Follow-up chest x-ray is pending per Dr. Luan Pulling.  3. Essential hypertension, blood pressure is well controlled today. She does report elevations in her morning blood pressures. Could consider the possibility of a sleep apnea evaluation.  4. Hyperlipidemia, she continues on Lipitor.  Current medicines were reviewed at length with the patient today.   Orders Placed This Encounter  Procedures  . Basic Metabolic Panel (BMET)    Disposition: FU with me in 3 months.   Signed, Satira Sark, MD, [facility] 04/15/2014 4:39 PM    [facility] [address], [place], [place] [address] Phone: [phone]; Fax: [phone]

## 2014-04-15 NOTE — Patient Instructions (Signed)
Your physician recommends that you schedule a follow-up appointment in: 3 months with [place]   Your physician recommends that you continue on your current medications as directed. Please refer to the Current Medication list given to you today.   Please have lab work [facility]) JUST BEFORE NEXT VISIT     Thank you for choosing [facility] !

## 2014-05-06 ENCOUNTER — Encounter (HOSPITAL_COMMUNITY): Payer: Medicare Other

## 2014-05-06 ENCOUNTER — Other Ambulatory Visit (HOSPITAL_COMMUNITY): Payer: Medicare Other

## 2014-05-06 ENCOUNTER — Ambulatory Visit: Payer: Medicare Other | Admitting: Vascular Surgery

## 2014-05-12 ENCOUNTER — Encounter: Payer: Self-pay | Admitting: Vascular Surgery

## 2014-05-13 ENCOUNTER — Ambulatory Visit (INDEPENDENT_AMBULATORY_CARE_PROVIDER_SITE_OTHER)
Admission: RE | Admit: 2014-05-13 | Discharge: 2014-05-13 | Disposition: A | Discharge status: Home / Self Care | Payer: Medicare Other | Source: Ambulatory Visit | Attending: Vascular Surgery | Admitting: Vascular Surgery

## 2014-05-13 ENCOUNTER — Ambulatory Visit (INDEPENDENT_AMBULATORY_CARE_PROVIDER_SITE_OTHER): Payer: Self-pay | Admitting: Vascular Surgery

## 2014-05-13 ENCOUNTER — Encounter: Payer: Self-pay | Admitting: Vascular Surgery

## 2014-05-13 ENCOUNTER — Ambulatory Visit (HOSPITAL_COMMUNITY)
Admission: RE | Admit: 2014-05-13 | Discharge: 2014-05-13 | Disposition: A | Discharge status: Home / Self Care | Payer: Medicare Other | Source: Ambulatory Visit | Attending: Vascular Surgery | Admitting: Vascular Surgery

## 2014-05-13 VITALS — BP 178/47 | HR 55 | Ht 66.0 in | Wt 198.0 lb

## 2014-05-13 DIAGNOSIS — Z48812 Encounter for surgical aftercare following surgery on the circulatory system: Secondary | ICD-10-CM

## 2014-05-13 DIAGNOSIS — I739 Peripheral vascular disease, unspecified: Secondary | ICD-10-CM | POA: Insufficient documentation

## 2014-05-13 NOTE — Progress Notes (Signed)
Established Previous Bypass  History of Present Illness  [name] [name] is a 69 y.o. (19-Jun-1945) female who presents for continued follow-up s/p left common femoral to below-knee popliteal artery bypass graft with PTFE and popliteal artery endartectomy for non healing wound of left foot  (Date: 10/07/13). She denies any new non-healing wounds. She reports some wounds to her legs bilaterally that heal anywhere between "4 days to 3 weeks." She reports cramping of her hips, calves and feet both at rest and with ambulation. Both legs are equally painful. She admits to walking very little. She uses a walker. She is on home oxygen. The patient's treatment regimen currently included: maximal medical management with aspirin, plavix and a statin.   The patient's PMH, PSH, SH, FamHx, Med, and Allergies are unchanged from 11/12/2013.   She previously had bilateral carotid endarterectomies by Dr. Kellie Simmering.   Physical Examination  Filed Vitals:   05/13/14 1515 05/13/14 1517  BP: 167/38 178/47  Pulse: 56 55  Height: 5' 6" (1.676 m)   Weight: 198 lb (89.812 kg)   SpO2: 94%    Body mass index is 31.97 kg/(m^2).  Physical Examination  Filed Vitals:   05/13/14 1515 05/13/14 1517  BP: 167/38 178/47  Pulse: 56 55  Height: 5' 6" (1.676 m)   Weight: 198 lb (89.812 kg)   SpO2: 94%    Body mass index is 31.97 kg/(m^2).  General: A&O x 3, WDWN female in NAD. On home O2.   Pulmonary: Sym exp, good air movt, decreased breath sounds bilaterally.   Cardiac: RRR, Nl S1, S2, no Murmurs, rubs or gallops  Vascular: 1+ right femoral pulse, 2+ left femoral pulse. Non palpable pedal pulses. Feet are warm. 2+ pitting edema bilaterally with venous stasis changes to lower legs. Few superficial ulcers to lower legs bilaterally. Blood blister to right 2nd toe.   Musculoskeletal:  Extremities without ischemic changes  Neurologic: Pain and light touch intact in extremities.   Non-Invasive Vascular Imaging ABI  (Date: 05/13/2014) R: 0.46, DP monophasic, PT: monophasic, TBI: 0,30 L: 1.02, DP: biphasic, PT: biphasic, TBI: 0.59   Lower extremity bypass duplex (Date: 05/13/2014)  Bypass is patent with evidence of mild hyperplasia at proximal anastomosis.   Medical Decision Making   [name] [name] is a 69 y.o. female who presents bilateral peripheral vascular disease s/p left common femoral to below knee popliteal artery bypass with PTFE on 10/07/13.   The patient's bypass is patent. She denies any claudication, rest pain or non-healing wounds of her left extremity. She constantly has cramps in her thighs and calves with both sitting and walking. She denies any non-healing wounds or true claudication symptoms as she is mostly sedentary. She does have some superficial wounds on her legs that look like venous stasis wounds. These have healed without difficulty. She is on maximal medical management with a statin, plavix and aspirin. Fortunately she is no longer a smoker. Emphasized that she needs to mobilize more. She will follow-up in six months with left graft duplex. She will follow-up with Dr. Gwenlyn Found for ABIs and for evaluation for possible right leg stent.   Virgina Jock, PA-C [facility] Office: [phone] Pager: [phone]  05/13/2014, 4:01 PM  This patient was seen in conjunction with Dr. Scot Dock

## 2014-06-01 ENCOUNTER — Other Ambulatory Visit: Payer: Self-pay | Admitting: *Deleted

## 2014-06-01 MED ORDER — LISINOPRIL 20 MG PO TABS: 20.0000 mg | ORAL_TABLET | Freq: Every day | ORAL | Status: DC

## 2014-06-01 NOTE — Telephone Encounter (Signed)
Refill complete

## 2014-06-25 ENCOUNTER — Other Ambulatory Visit: Payer: Self-pay

## 2014-06-25 MED ORDER — POTASSIUM CHLORIDE ER 10 MEQ PO TBCR: 10.0000 meq | EXTENDED_RELEASE_TABLET | Freq: Every day | ORAL | Status: DC

## 2014-06-25 NOTE — Telephone Encounter (Signed)
Refill complete

## 2014-07-04 ENCOUNTER — Other Ambulatory Visit: Payer: Self-pay | Admitting: Adult Health

## 2014-07-06 ENCOUNTER — Other Ambulatory Visit: Payer: Self-pay

## 2014-07-09 ENCOUNTER — Other Ambulatory Visit: Payer: Self-pay

## 2014-07-09 MED ORDER — AMLODIPINE BESYLATE 10 MG PO TABS: 10.0000 mg | ORAL_TABLET | Freq: Every day | ORAL | Status: DC

## 2014-09-03 ENCOUNTER — Inpatient Hospital Stay (HOSPITAL_COMMUNITY)
Admission: AD | Admit: 2014-09-03 | Discharge: 2014-09-12 | DRG: 291 | Disposition: A | Discharge status: Home / Self Care | Payer: Medicare Other | Source: Other Acute Inpatient Hospital | Attending: Internal Medicine | Admitting: Internal Medicine

## 2014-09-03 DIAGNOSIS — I255 Ischemic cardiomyopathy: Secondary | ICD-10-CM | POA: Diagnosis present

## 2014-09-03 DIAGNOSIS — L8990 Pressure ulcer of unspecified site, unspecified stage: Secondary | ICD-10-CM | POA: Diagnosis not present

## 2014-09-03 DIAGNOSIS — I272 Other secondary pulmonary hypertension: Secondary | ICD-10-CM | POA: Diagnosis present

## 2014-09-03 DIAGNOSIS — J90 Pleural effusion, not elsewhere classified: Secondary | ICD-10-CM | POA: Diagnosis present

## 2014-09-03 DIAGNOSIS — I252 Old myocardial infarction: Secondary | ICD-10-CM

## 2014-09-03 DIAGNOSIS — M549 Dorsalgia, unspecified: Secondary | ICD-10-CM | POA: Diagnosis present

## 2014-09-03 DIAGNOSIS — R0600 Dyspnea, unspecified: Secondary | ICD-10-CM | POA: Diagnosis not present

## 2014-09-03 DIAGNOSIS — I5033 Acute on chronic diastolic (congestive) heart failure: Secondary | ICD-10-CM | POA: Insufficient documentation

## 2014-09-03 DIAGNOSIS — R079 Chest pain, unspecified: Secondary | ICD-10-CM | POA: Diagnosis present

## 2014-09-03 DIAGNOSIS — Z951 Presence of aortocoronary bypass graft: Secondary | ICD-10-CM

## 2014-09-03 DIAGNOSIS — Z66 Do not resuscitate: Secondary | ICD-10-CM | POA: Diagnosis not present

## 2014-09-03 DIAGNOSIS — I739 Peripheral vascular disease, unspecified: Secondary | ICD-10-CM | POA: Diagnosis not present

## 2014-09-03 DIAGNOSIS — Z7982 Long term (current) use of aspirin: Secondary | ICD-10-CM

## 2014-09-03 DIAGNOSIS — R0602 Shortness of breath: Secondary | ICD-10-CM | POA: Diagnosis not present

## 2014-09-03 DIAGNOSIS — R609 Edema, unspecified: Secondary | ICD-10-CM | POA: Diagnosis not present

## 2014-09-03 DIAGNOSIS — E1165 Type 2 diabetes mellitus with hyperglycemia: Secondary | ICD-10-CM | POA: Diagnosis present

## 2014-09-03 DIAGNOSIS — M79605 Pain in left leg: Secondary | ICD-10-CM

## 2014-09-03 DIAGNOSIS — I248 Other forms of acute ischemic heart disease: Secondary | ICD-10-CM | POA: Diagnosis not present

## 2014-09-03 DIAGNOSIS — E1122 Type 2 diabetes mellitus with diabetic chronic kidney disease: Secondary | ICD-10-CM | POA: Diagnosis not present

## 2014-09-03 DIAGNOSIS — F1290 Cannabis use, unspecified, uncomplicated: Secondary | ICD-10-CM | POA: Diagnosis not present

## 2014-09-03 DIAGNOSIS — Z7902 Long term (current) use of antithrombotics/antiplatelets: Secondary | ICD-10-CM | POA: Diagnosis not present

## 2014-09-03 DIAGNOSIS — Z794 Long term (current) use of insulin: Secondary | ICD-10-CM | POA: Diagnosis not present

## 2014-09-03 DIAGNOSIS — J9621 Acute and chronic respiratory failure with hypoxia: Secondary | ICD-10-CM | POA: Diagnosis present

## 2014-09-03 DIAGNOSIS — Z87891 Personal history of nicotine dependence: Secondary | ICD-10-CM | POA: Diagnosis not present

## 2014-09-03 DIAGNOSIS — I5043 Acute on chronic combined systolic (congestive) and diastolic (congestive) heart failure: Secondary | ICD-10-CM | POA: Diagnosis present

## 2014-09-03 DIAGNOSIS — J948 Other specified pleural conditions: Secondary | ICD-10-CM | POA: Diagnosis not present

## 2014-09-03 DIAGNOSIS — N179 Acute kidney failure, unspecified: Secondary | ICD-10-CM | POA: Diagnosis not present

## 2014-09-03 DIAGNOSIS — I10 Essential (primary) hypertension: Secondary | ICD-10-CM | POA: Diagnosis present

## 2014-09-03 DIAGNOSIS — R0902 Hypoxemia: Secondary | ICD-10-CM

## 2014-09-03 DIAGNOSIS — D649 Anemia, unspecified: Secondary | ICD-10-CM | POA: Diagnosis present

## 2014-09-03 DIAGNOSIS — Z9981 Dependence on supplemental oxygen: Secondary | ICD-10-CM | POA: Diagnosis not present

## 2014-09-03 DIAGNOSIS — IMO0002 Reserved for concepts with insufficient information to code with codable children: Secondary | ICD-10-CM | POA: Diagnosis present

## 2014-09-03 DIAGNOSIS — E782 Mixed hyperlipidemia: Secondary | ICD-10-CM | POA: Diagnosis not present

## 2014-09-03 DIAGNOSIS — I160 Hypertensive urgency: Secondary | ICD-10-CM | POA: Diagnosis present

## 2014-09-03 DIAGNOSIS — I129 Hypertensive chronic kidney disease with stage 1 through stage 4 chronic kidney disease, or unspecified chronic kidney disease: Secondary | ICD-10-CM | POA: Diagnosis not present

## 2014-09-03 DIAGNOSIS — M2660 Temporomandibular joint disorder, unspecified: Secondary | ICD-10-CM | POA: Diagnosis present

## 2014-09-03 DIAGNOSIS — I509 Heart failure, unspecified: Secondary | ICD-10-CM | POA: Diagnosis not present

## 2014-09-03 DIAGNOSIS — N189 Chronic kidney disease, unspecified: Secondary | ICD-10-CM | POA: Diagnosis not present

## 2014-09-03 DIAGNOSIS — I2510 Atherosclerotic heart disease of native coronary artery without angina pectoris: Secondary | ICD-10-CM | POA: Diagnosis present

## 2014-09-03 DIAGNOSIS — J449 Chronic obstructive pulmonary disease, unspecified: Secondary | ICD-10-CM | POA: Diagnosis present

## 2014-09-03 DIAGNOSIS — Z8673 Personal history of transient ischemic attack (TIA), and cerebral infarction without residual deficits: Secondary | ICD-10-CM

## 2014-09-03 DIAGNOSIS — G8929 Other chronic pain: Secondary | ICD-10-CM | POA: Diagnosis not present

## 2014-09-03 DIAGNOSIS — I270 Primary pulmonary hypertension: Secondary | ICD-10-CM | POA: Diagnosis not present

## 2014-09-03 LAB — GLUCOSE, CAPILLARY: Glucose-Capillary: 238 mg/dL — ABNORMAL HIGH (ref 65–99)

## 2014-09-03 MED ORDER — AMLODIPINE BESYLATE 10 MG PO TABS
10.0000 mg | ORAL_TABLET | Freq: Every day | ORAL | Status: DC
  Administered 2014-09-04 – 2014-09-12 (×9): 10 mg via ORAL
  Filled 2014-09-03 (×9): qty 1

## 2014-09-03 MED ORDER — INSULIN ASPART 100 UNIT/ML ~~LOC~~ SOLN
4.0000 [IU] | Freq: Three times a day (TID) | SUBCUTANEOUS | Status: DC
  Administered 2014-09-04 – 2014-09-06 (×7): 4 [IU] via SUBCUTANEOUS

## 2014-09-03 MED ORDER — ALBUTEROL SULFATE (2.5 MG/3ML) 0.083% IN NEBU: 3.0000 mL | INHALATION_SOLUTION | RESPIRATORY_TRACT | Status: DC | PRN

## 2014-09-03 MED ORDER — POTASSIUM CHLORIDE ER 10 MEQ PO TBCR
10.0000 meq | EXTENDED_RELEASE_TABLET | Freq: Every day | ORAL | Status: DC
  Administered 2014-09-04 – 2014-09-12 (×9): 10 meq via ORAL
  Filled 2014-09-03 (×17): qty 1

## 2014-09-03 MED ORDER — LABETALOL HCL 5 MG/ML IV SOLN
INTRAVENOUS | Status: AC
  Filled 2014-09-03: qty 4

## 2014-09-03 MED ORDER — SODIUM CHLORIDE 0.9 % IJ SOLN
3.0000 mL | Freq: Two times a day (BID) | INTRAMUSCULAR | Status: DC
  Administered 2014-09-04 – 2014-09-12 (×17): 3 mL via INTRAVENOUS

## 2014-09-03 MED ORDER — CITALOPRAM HYDROBROMIDE 20 MG PO TABS
20.0000 mg | ORAL_TABLET | Freq: Every day | ORAL | Status: DC
  Administered 2014-09-04 – 2014-09-12 (×9): 20 mg via ORAL
  Filled 2014-09-03 (×9): qty 1

## 2014-09-03 MED ORDER — CLOPIDOGREL BISULFATE 75 MG PO TABS
75.0000 mg | ORAL_TABLET | Freq: Every day | ORAL | Status: DC
  Administered 2014-09-04 – 2014-09-12 (×9): 75 mg via ORAL
  Filled 2014-09-03 (×9): qty 1

## 2014-09-03 MED ORDER — TORSEMIDE 20 MG PO TABS
40.0000 mg | ORAL_TABLET | Freq: Every day | ORAL | Status: DC
Start: 2014-09-04 — End: 2014-09-04

## 2014-09-03 MED ORDER — HEPARIN SODIUM (PORCINE) 5000 UNIT/ML IJ SOLN
5000.0000 [IU] | Freq: Three times a day (TID) | INTRAMUSCULAR | Status: DC
  Administered 2014-09-04 – 2014-09-05 (×6): 5000 [IU] via SUBCUTANEOUS
  Filled 2014-09-03 (×6): qty 1

## 2014-09-03 MED ORDER — LISINOPRIL 20 MG PO TABS
20.0000 mg | ORAL_TABLET | Freq: Every day | ORAL | Status: DC
  Administered 2014-09-04 – 2014-09-05 (×2): 20 mg via ORAL
  Filled 2014-09-03 (×2): qty 1

## 2014-09-03 MED ORDER — HYDRALAZINE HCL 25 MG PO TABS
25.0000 mg | ORAL_TABLET | Freq: Three times a day (TID) | ORAL | Status: DC
  Administered 2014-09-04 – 2014-09-07 (×9): 25 mg via ORAL
  Filled 2014-09-03 (×9): qty 1

## 2014-09-03 MED ORDER — HYDRALAZINE HCL 20 MG/ML IJ SOLN
20.0000 mg | Freq: Once | INTRAMUSCULAR | Status: AC
  Administered 2014-09-03: 20 mg via INTRAVENOUS

## 2014-09-03 MED ORDER — HYDRALAZINE HCL 20 MG/ML IJ SOLN
INTRAMUSCULAR | Status: AC
  Filled 2014-09-03: qty 1

## 2014-09-03 MED ORDER — DOCUSATE SODIUM 100 MG PO CAPS: 200.0000 mg | ORAL_CAPSULE | Freq: Every day | ORAL | Status: DC | PRN

## 2014-09-03 MED ORDER — INSULIN DETEMIR 100 UNIT/ML ~~LOC~~ SOLN
18.0000 [IU] | Freq: Every day | SUBCUTANEOUS | Status: DC
  Administered 2014-09-04: 18 [IU] via SUBCUTANEOUS
  Filled 2014-09-03 (×2): qty 0.18

## 2014-09-03 MED ORDER — LABETALOL HCL 5 MG/ML IV SOLN
10.0000 mg | Freq: Once | INTRAVENOUS | Status: AC
  Administered 2014-09-03: 10 mg via INTRAVENOUS

## 2014-09-03 MED ORDER — ATORVASTATIN CALCIUM 40 MG PO TABS
40.0000 mg | ORAL_TABLET | Freq: Every day | ORAL | Status: DC
  Administered 2014-09-04 – 2014-09-11 (×8): 40 mg via ORAL
  Filled 2014-09-03 (×8): qty 1

## 2014-09-03 MED ORDER — SODIUM CHLORIDE 0.9 % IV SOLN: 250.0000 mL | INTRAVENOUS | Status: DC | PRN

## 2014-09-03 MED ORDER — ISOSORBIDE MONONITRATE ER 30 MG PO TB24
30.0000 mg | ORAL_TABLET | Freq: Every day | ORAL | Status: DC
  Administered 2014-09-04 – 2014-09-05 (×2): 30 mg via ORAL
  Filled 2014-09-03 (×2): qty 1

## 2014-09-03 MED ORDER — ASPIRIN EC 81 MG PO TBEC
81.0000 mg | DELAYED_RELEASE_TABLET | Freq: Every day | ORAL | Status: DC
  Administered 2014-09-04 – 2014-09-05 (×2): 81 mg via ORAL
  Filled 2014-09-03 (×4): qty 1

## 2014-09-03 MED ORDER — ACETAMINOPHEN 325 MG PO TABS
650.0000 mg | ORAL_TABLET | ORAL | Status: DC | PRN
  Administered 2014-09-04: 650 mg via ORAL
  Filled 2014-09-03: qty 2

## 2014-09-03 MED ORDER — LABETALOL HCL 5 MG/ML IV SOLN
10.0000 mg | INTRAVENOUS | Status: DC | PRN
  Administered 2014-09-04: 10 mg via INTRAVENOUS
  Filled 2014-09-03: qty 4

## 2014-09-03 MED ORDER — INSULIN ASPART 100 UNIT/ML ~~LOC~~ SOLN
0.0000 [IU] | Freq: Three times a day (TID) | SUBCUTANEOUS | Status: DC
  Administered 2014-09-04 (×2): 3 [IU] via SUBCUTANEOUS
  Administered 2014-09-05: 2 [IU] via SUBCUTANEOUS
  Administered 2014-09-05 – 2014-09-09 (×7): 1 [IU] via SUBCUTANEOUS
  Administered 2014-09-10: 2 [IU] via SUBCUTANEOUS
  Administered 2014-09-10: 1 [IU] via SUBCUTANEOUS
  Administered 2014-09-11 – 2014-09-12 (×3): 2 [IU] via SUBCUTANEOUS

## 2014-09-03 MED ORDER — ONDANSETRON HCL 4 MG/2ML IJ SOLN
4.0000 mg | Freq: Four times a day (QID) | INTRAMUSCULAR | Status: DC | PRN
  Administered 2014-09-04: 4 mg via INTRAVENOUS
  Filled 2014-09-03: qty 2

## 2014-09-03 MED ORDER — SODIUM CHLORIDE 0.9 % IJ SOLN: 3.0000 mL | INTRAMUSCULAR | Status: DC | PRN

## 2014-09-03 MED ORDER — UMECLIDINIUM-VILANTEROL 62.5-25 MCG/INH IN AEPB
INHALATION_SPRAY | Freq: Every day | RESPIRATORY_TRACT | Status: DC
Start: 2014-09-04 — End: 2014-09-12
  Administered 2014-09-08: 10:00:00 via RESPIRATORY_TRACT
  Filled 2014-09-03 (×5): qty 1

## 2014-09-03 MED ORDER — CARVEDILOL 6.25 MG PO TABS
18.7500 mg | ORAL_TABLET | Freq: Two times a day (BID) | ORAL | Status: DC
  Administered 2014-09-04 – 2014-09-06 (×5): 18.75 mg via ORAL
  Filled 2014-09-03 (×10): qty 1

## 2014-09-03 NOTE — H&P (Addendum)
[facility] History and Physical  [name] [name] HEN:[id number] DOB: 1945-03-04 DOA: 09/03/2014  Referring physician: EDP PCP: Glenda Chroman., MD   Chief Complaint: SOB   HPI: [name] [name] is a 69 y.o. female with h/o CAD, CABG in 2012, stent to CABG graft in feb of 2015.  Patient presents to the ED at [facility] with c/o chest pain and SOB.  She developed some SOB through the night last evening.  She got up this morning, made breakfast, had her morning meds (including her 6 PO HTN meds she takes), and then vomited the meds back up.  Her SOB got worse throughout the day and she developed chest pain as well.  She went to [facility] ED, CXR there showed a moderate R pleural effusion (significantly increased), R basilar atelectasis possible pneumonia, and pulmonary vascular congestion.  She was given rocephin, azithromycin, lasix, solumedrol, and nitro paste.  Her pro BNP is 3638, her troponin T is 0.06.  Hospitalist at [facility] didn't feel comfortable admitting with troponin elevation so patient was transferred to [place].  Her BP over at [place] was 361 systolic.  On arrival here to the SDU, she has had 6 BP readings the lowest of which is 443 systolic, and they range up to 154 systolic.  No fever, no cough.  Review of Systems: Systems reviewed.  As above, otherwise negative  Past Medical History  Diagnosis Date  . Mixed hyperlipidemia   . Essential hypertension, benign   . Depression   . Cellulitis     a. Recurrent, bilateral legs  . Carotid artery disease     a. Bilateral CEA; 50-60% bilateral ICA stenosis, 11/12  . PAD (peripheral artery disease)   . Suicide attempt 08/2002  . TIA (transient ischemic attack)   . TMJ syndrome   . Herniated disc   . Ischemic cardiomyopathy     a. 11/2010 TEE: EF 40-45%.  . Critical lower limb ischemia   . Venous insufficiency   . Coronary atherosclerosis of native coronary artery     a. 11/2010 NSTEMI/CABG x 4: L-LAD; S-PL; S-OM; S-DX, by PVT.   [name] NSTEMI (non-ST elevated myocardial infarction) 11/2010  . Pneumonia 2000's X 2  . Type 2 diabetes mellitus   . History of blood transfusion   . Migraine   . Stroke 08/2009  . Arthritis   . Chronic back pain   . On home oxygen therapy   . Chronic diastolic heart failure   . CHF (congestive heart failure)   . Chronic kidney disease    Past Surgical History  Procedure Laterality Date  . Appendectomy    . Cholecystectomy    . Abdominal hysterectomy    . Carotid endarterectomy Bilateral 2011    Bilateral - Dr. Kellie Simmering  . Wrist fracture surgery Left     "grafted bone from hip to wrist"  . Coronary artery bypass graft  11/24/2010    Procedure: CORONARY ARTERY BYPASS GRAFTING (CABG);  Surgeon: Tharon Aquas Adelene Idler, MD;  Location: [place];  Service: Open Heart Surgery;  Laterality: N/A;  Coronary Artery Bypass Graft times four on pump utilizing left internal mammary artery and bilateral greater saphenous veins harvested endoscopically, transesophageal echocardiogram   . Toe surgery Left     "put pin in 2nd toe"  . Lower extremity angiogram  09/08/2012; 10/06/2013    "found 100% blockage; unsuccessful attempt at crossing a chronic total occlusion of the left SFA in the setting of critical limb ischemia  . Dilation and  curettage of uterus    . Tubal ligation    . Debridement toe Left     "nonhealing wound; 3rd digit  . Cardiac catheterization  11/2010  . Coronary angioplasty with stent placement  03/2013    "1"  . Colonoscopy w/ biopsies and polypectomy    . Fracture surgery    . Femoral-popliteal bypass graft Left 10/07/2013    Procedure: LEFT FEMORAL-POPLITEAL ARTERY BYPASS GRAFT;  Surgeon: Angelia Mould, MD;  Location: [place];  Service: Vascular;  Laterality: Left;  . Intraoperative arteriogram Left 10/07/2013    Procedure: INTRA OPERATIVE ARTERIOGRAM LEFT LEG;  Surgeon: Angelia Mould, MD;  Location: [place];  Service: Vascular;  Laterality: Left;  . Endarterectomy popliteal  Left 10/07/2013    Procedure: LEFT POPLITEAL ENDARTERECTOMY ;  Surgeon: Angelia Mould, MD;  Location: [place];  Service: Vascular;  Laterality: Left;  . Left and right heart catheterization with coronary/graft angiogram N/A 03/27/2013    Procedure: LEFT AND RIGHT HEART CATHETERIZATION WITH Beatrix Fetters;  Surgeon: Burnell Blanks, MD;  Location: [facility] CATH LAB;  Service: Cardiovascular;  Laterality: N/A;  . Lower extremity angiogram Bilateral 09/08/2013    Procedure: LOWER EXTREMITY ANGIOGRAM;  Surgeon: [name], MD;  Location: [facility] CATH LAB;  Service: Cardiovascular;  Laterality: Bilateral;   Social History:  reports that she quit smoking about 4 years ago. Her smoking use included Cigarettes. She started smoking about 58 years ago. She has a 150 pack-year smoking history. She has never used smokeless tobacco. She reports that she drinks alcohol. She reports that she uses illicit drugs (Marijuana).  Allergies  Allergen Reactions  . Other Shortness Of Breath, Rash and Other (See Comments)    All berries   . Strawberry Hives, Shortness Of Breath and Rash  . Sulfa Antibiotics Swelling    Family History  Problem Relation Age of Onset  . Coronary artery disease Father     Died with MI age 47  . Heart disease Father   . Heart attack Father   . Diabetes type II Mother   . Hypertension Mother   . Diabetes Mother   . Heart disease Mother   . Hyperlipidemia Mother   . Heart failure Sister   . Cancer Sister   . Cancer Brother     Lung cancer  . Diabetes Daughter   . Hyperlipidemia Daughter   . Diabetes Son   . Hyperlipidemia Son      Prior to Admission medications   Medication Sig Start Date End Date Taking? Authorizing Provider  albuterol (PROVENTIL HFA;VENTOLIN HFA) 108 (90 BASE) MCG/ACT inhaler Inhale 2 puffs into the lungs as needed for wheezing or shortness of breath.    Historical Provider, MD  amLODipine (NORVASC) 10 MG tablet Take 1 tablet (10 mg total)  by mouth daily. 07/09/14   Satira Sark, MD  aspirin 81 MG EC tablet Take 81 mg by mouth daily. 03/29/13   Brittainy Erie Noe, PA-C  atorvastatin (LIPITOR) 40 MG tablet Take 40 mg by mouth daily at 6 PM. 03/29/13   Brittainy M Rosita Fire, PA-C  carvedilol (COREG) 12.5 MG tablet Take 18.75 mg by mouth 2 (two) times daily. 07/30/13   Satira Sark, MD  citalopram (CELEXA) 20 MG tablet Take 20 mg by mouth daily.    Historical Provider, MD  clopidogrel (PLAVIX) 75 MG tablet Take 1 tablet (75 mg total) by mouth daily with breakfast. 03/16/14   Satira Sark, MD  Docusate Calcium (  STOOL SOFTENER PO) Take 2 capsules by mouth daily as needed (for constipation).    Historical Provider, MD  glimepiride (AMARYL) 2 MG tablet Take 2 mg by mouth 2 (two) times daily.     Historical Provider, MD  HYDROcodone-acetaminophen (NORCO/VICODIN) 5-325 MG per tablet Take 1 tablet by mouth every 6 (six) hours as needed for moderate pain. 11/10/13   Sharmon Leyden Nickel, NP  insulin aspart (NOVOLOG) 100 UNIT/ML injection Inject 8 Units into the skin 3 (three) times daily with meals. 03/29/13   Brittainy Erie Noe, PA-C  insulin detemir (LEVEMIR) 100 UNIT/ML injection Inject 22 Units into the skin at bedtime.  03/29/13   Brittainy Erie Noe, PA-C  isosorbide mononitrate (IMDUR) 30 MG 24 hr tablet Take 30 mg by mouth daily.    Historical Provider, MD  lisinopril (PRINIVIL,ZESTRIL) 20 MG tablet Take 1 tablet (20 mg total) by mouth daily. 06/01/14   Satira Sark, MD  Multiple Vitamin (MULTIVITAMIN WITH MINERALS) TABS tablet Take 1 tablet by mouth daily.    Historical Provider, MD  nitroGLYCERIN (NITROSTAT) 0.4 MG SL tablet Place 1 tablet (0.4 mg total) under the tongue every 5 (five) minutes as needed for chest pain. Up to 3 doses. If no relief after 3rd dose, proceed to the ED for an evaluation 07/30/13   Satira Sark, MD  potassium chloride (K-DUR) 10 MEQ tablet Take 1 tablet (10 mEq total) by mouth daily. 06/25/14   Satira Sark, MD  PRESCRIPTION MEDICATION 2 liters of Oxygen    Historical Provider, MD  torsemide (DEMADEX) 20 MG tablet Take 40 mg in the am only per [facility] 02/13/14 02/13/14   Satira Sark, MD  torsemide (DEMADEX) 20 MG tablet TAKE TWO TABLETS BY MOUTH IN THE MORNING AND ONE IN THE EVENING 07/06/14   Satira Sark, MD  Umeclidinium-Vilanterol 62.5-25 MCG/INH AEPB Inhale into the lungs daily.    Historical Provider, MD   Physical Exam: Filed Vitals:   09/03/14 2245  BP: 217/70    BP 217/70 mmHg  General Appearance:    Alert, oriented, no distress, appears stated age  Head:    Normocephalic, atraumatic  Eyes:    PERRL, EOMI, sclera non-icteric        Nose:   Nares without drainage or epistaxis. Mucosa, turbinates normal  Throat:   Moist mucous membranes. Oropharynx without erythema or exudate.  Neck:   Supple. No carotid bruits.  No thyromegaly.  No lymphadenopathy.   Back:     No CVA tenderness, no spinal tenderness  Lungs:     Bilateral rales, diminished at right base.  Chest wall:    No tenderness to palpitation  Heart:    Regular rate and rhythm without murmurs, gallops, rubs  Abdomen:     Soft, non-tender, nondistended, normal bowel sounds, no organomegaly  Genitalia:    deferred  Rectal:    deferred  Extremities:   No clubbing, cyanosis or edema.  Pulses:   2+ and symmetric all extremities  Skin:   Skin color, texture, turgor normal, no rashes or lesions  Lymph nodes:   Cervical, supraclavicular, and axillary nodes normal  Neurologic:   CNII-XII intact. Normal strength, sensation and reflexes      throughout    Labs on Admission:  Basic Metabolic Panel: No results for input(s): NA, K, CL, CO2, GLUCOSE, BUN, CREATININE, CALCIUM, MG, PHOS in the last 168 hours. Liver Function Tests: No results for input(s): AST, ALT, ALKPHOS, BILITOT, PROT, ALBUMIN in  the last 168 hours. No results for input(s): LIPASE, AMYLASE in the last 168 hours. No results for input(s): AMMONIA in  the last 168 hours. CBC: No results for input(s): WBC, NEUTROABS, HGB, HCT, MCV, PLT in the last 168 hours. Cardiac Enzymes: No results for input(s): CKTOTAL, CKMB, CKMBINDEX, TROPONINI in the last 168 hours.  BNP (last 3 results) No results for input(s): PROBNP in the last 8760 hours. CBG: No results for input(s): GLUCAP in the last 168 hours.  Radiological Exams on Admission: No results found.  EKG: Independently reviewed.  Assessment/Plan Principal Problem:   Hypertensive urgency Active Problems:   Hypertension, uncontrolled   Diabetes mellitus type II, uncontrolled   Acute on chronic combined systolic and diastolic heart failure   Pleural effusion, right   Acute on chronic respiratory failure with hypoxia   1. HTN urgency causing decompensation of diastolic CHF - 1. Trying IV labetalol 2. Resume all home BP meds (that she vomited up this morning) 3. Patient got lasix 40 in ED, will leave her on her home dose of torsemide 40 daily for now.  Feel that most of her trouble is due to uncontrolled BP 4. Serial troponins but doubt type 1 MI 5. Goal BP systolic < 698 (patient says she usually runs in the 150s on a normal day). 6. Trying IV hydralazine now since labetalol had marginal effect 7. If all above fails try cardene gtt 2. DM2 - 1. levemir 18 QHS 2. Mod dose mealtime 3. Mod dose SSI 3. Acute on chronic respiratory failure with hypoxia - associated R sided pleural effusion on CXR 1. Suspect that this is related to CHF 2. PNA felt less likely (did get ABx in ED though), would resume ABX if patient develops SIRS criteria, does not get better with improvement in HTN etc. 3. Plan to maintain patient on torsemide as noted above 4. Repeat CXR in AM 5. May wish to have IR tap this tomorrow if she remains hypoxic    Code Status: Full Code  Family Communication: No family in room Disposition Plan: Admit to inpatient   Time spent: 70 min  Koray Soter M. [facility] Pager [phone]  If 7AM-7PM, please contact the day team taking care of the patient [email or web address] Password [facility] 09/03/2014, 11:36 PM

## 2014-09-04 ENCOUNTER — Inpatient Hospital Stay (HOSPITAL_COMMUNITY): Payer: Medicare Other

## 2014-09-04 ENCOUNTER — Encounter (HOSPITAL_COMMUNITY): Payer: Self-pay

## 2014-09-04 ENCOUNTER — Other Ambulatory Visit (HOSPITAL_COMMUNITY): Payer: Medicare Other

## 2014-09-04 DIAGNOSIS — J90 Pleural effusion, not elsewhere classified: Secondary | ICD-10-CM | POA: Diagnosis present

## 2014-09-04 DIAGNOSIS — J9621 Acute and chronic respiratory failure with hypoxia: Secondary | ICD-10-CM | POA: Diagnosis present

## 2014-09-04 DIAGNOSIS — I509 Heart failure, unspecified: Secondary | ICD-10-CM

## 2014-09-04 DIAGNOSIS — I10 Essential (primary) hypertension: Secondary | ICD-10-CM

## 2014-09-04 DIAGNOSIS — I5043 Acute on chronic combined systolic (congestive) and diastolic (congestive) heart failure: Principal | ICD-10-CM

## 2014-09-04 DIAGNOSIS — L8990 Pressure ulcer of unspecified site, unspecified stage: Secondary | ICD-10-CM | POA: Insufficient documentation

## 2014-09-04 LAB — BASIC METABOLIC PANEL
Anion gap: 8 (ref 5–15)
BUN: 34 mg/dL — ABNORMAL HIGH (ref 6–20)
CHLORIDE: 100 mmol/L — AB (ref 101–111)
CO2: 29 mmol/L (ref 22–32)
CREATININE: 1.17 mg/dL — AB (ref 0.44–1.00)
Calcium: 9.2 mg/dL (ref 8.9–10.3)
GFR calc non Af Amer: 47 mL/min — ABNORMAL LOW (ref >60)
GFR, EST AFRICAN AMERICAN: 55 mL/min — AB (ref >60)
Glucose, Bld: 266 mg/dL — ABNORMAL HIGH (ref 65–99)
Potassium: 4.2 mmol/L (ref 3.5–5.1)
SODIUM: 137 mmol/L (ref 135–145)

## 2014-09-04 LAB — GLUCOSE, CAPILLARY
GLUCOSE-CAPILLARY: 59 mg/dL — AB (ref 65–99)
GLUCOSE-CAPILLARY: 88 mg/dL (ref 65–99)
Glucose-Capillary: 238 mg/dL — ABNORMAL HIGH (ref 65–99)
Glucose-Capillary: 225 mg/dL — ABNORMAL HIGH (ref 65–99)
Glucose-Capillary: 73 mg/dL (ref 65–99)

## 2014-09-04 LAB — TROPONIN I
TROPONIN I: 0.03 ng/mL (ref <0.031)
Troponin I: 0.04 ng/mL — ABNORMAL HIGH (ref <0.031)
Troponin I: 0.03 ng/mL (ref <0.031)

## 2014-09-04 LAB — MRSA PCR SCREENING: MRSA BY PCR: POSITIVE — AB

## 2014-09-04 MED ORDER — CHLORHEXIDINE GLUCONATE CLOTH 2 % EX PADS
6.0000 | MEDICATED_PAD | Freq: Every day | CUTANEOUS | Status: DC
  Administered 2014-09-05 – 2014-09-12 (×7): 6 via TOPICAL

## 2014-09-04 MED ORDER — INFLUENZA VAC SPLIT QUAD 0.5 ML IM SUSY
0.5000 mL | PREFILLED_SYRINGE | INTRAMUSCULAR | Status: AC
Start: 2014-09-05 — End: 2014-09-05
  Administered 2014-09-05: 0.5 mL via INTRAMUSCULAR
  Filled 2014-09-04: qty 0.5

## 2014-09-04 MED ORDER — FUROSEMIDE 10 MG/ML IJ SOLN
40.0000 mg | Freq: Two times a day (BID) | INTRAMUSCULAR | Status: DC
  Administered 2014-09-04 – 2014-09-05 (×4): 40 mg via INTRAVENOUS
  Filled 2014-09-04 (×4): qty 4

## 2014-09-04 MED ORDER — ADULT MULTIVITAMIN W/MINERALS CH
1.0000 | ORAL_TABLET | Freq: Every day | ORAL | Status: DC
  Administered 2014-09-05 – 2014-09-12 (×8): 1 via ORAL
  Filled 2014-09-04 (×8): qty 1

## 2014-09-04 MED ORDER — INSULIN DETEMIR 100 UNIT/ML ~~LOC~~ SOLN
22.0000 [IU] | Freq: Every day | SUBCUTANEOUS | Status: DC
  Administered 2014-09-05: 22 [IU] via SUBCUTANEOUS
  Filled 2014-09-04 (×3): qty 0.22

## 2014-09-04 MED ORDER — MUPIROCIN 2 % EX OINT
TOPICAL_OINTMENT | Freq: Two times a day (BID) | CUTANEOUS | Status: DC
  Administered 2014-09-04 – 2014-09-07 (×8): via NASAL
  Administered 2014-09-08: 1 via NASAL
  Administered 2014-09-08 – 2014-09-12 (×8): via NASAL
  Filled 2014-09-04 (×4): qty 22

## 2014-09-04 MED ORDER — OXYCODONE-ACETAMINOPHEN 5-325 MG PO TABS
1.0000 | ORAL_TABLET | Freq: Once | ORAL | Status: AC
  Administered 2014-09-04: 1 via ORAL
  Filled 2014-09-04: qty 1

## 2014-09-04 MED ORDER — HYDROCODONE-ACETAMINOPHEN 5-325 MG PO TABS
1.0000 | ORAL_TABLET | Freq: Once | ORAL | Status: AC
  Administered 2014-09-04: 1 via ORAL
  Filled 2014-09-04: qty 1

## 2014-09-04 MED ORDER — CETYLPYRIDINIUM CHLORIDE 0.05 % MT LIQD
7.0000 mL | Freq: Two times a day (BID) | OROMUCOSAL | Status: DC
  Administered 2014-09-04 – 2014-09-12 (×14): 7 mL via OROMUCOSAL

## 2014-09-04 MED ORDER — HYDRALAZINE HCL 20 MG/ML IJ SOLN
20.0000 mg | Freq: Once | INTRAMUSCULAR | Status: AC
  Administered 2014-09-04: 20 mg via INTRAVENOUS
  Filled 2014-09-04: qty 1

## 2014-09-04 MED ORDER — PNEUMOCOCCAL VAC POLYVALENT 25 MCG/0.5ML IJ INJ
0.5000 mL | INJECTION | INTRAMUSCULAR | Status: AC
  Administered 2014-09-05: 0.5 mL via INTRAMUSCULAR
  Filled 2014-09-04: qty 0.5

## 2014-09-04 MED ORDER — GABAPENTIN 300 MG PO CAPS
300.0000 mg | ORAL_CAPSULE | Freq: Every day | ORAL | Status: DC
  Administered 2014-09-05 – 2014-09-12 (×8): 300 mg via ORAL
  Filled 2014-09-04 (×8): qty 1

## 2014-09-04 MED ORDER — MORPHINE SULFATE (PF) 2 MG/ML IV SOLN
2.0000 mg | INTRAVENOUS | Status: DC | PRN
  Administered 2014-09-04 – 2014-09-12 (×23): 2 mg via INTRAVENOUS
  Filled 2014-09-04 (×24): qty 1

## 2014-09-04 NOTE — Consult Note (Signed)
CARDIOLOGY CONSULT NOTE   Patient ID: NATALEE TOMKIEWICZ MRN: [id number] DOB/AGE: 03/04/1945 69 y.o.  Admit date: 09/03/2014  Primary Physician   Glenda Chroman., MD Primary Cardiologist   Dr. Domenic Polite  Reason for Consultation   CHF  HPI: KATY BRICKELL is a 69 y.o. female with a history of CAD s/p CABG in 2012 and DES to SVG--> D1 (03/2013), COPD on nocturnal 02, HLD, HTN, PVD, carotid artery disease s/p bilateral CEA, CVA and chronic combined systolic and diastolic CHF who was transferred from [facility] on 09/03/14 for acute on chronic CHF, hypertensive urgency and elevated troponin.  She had NSTEMI/CABG x 4: L-LAD; S-PL; S-OM; S-DX in 11/2010.  She underwent cardiac catheterization in 03/2013 which revealed 1. Severe triple vessel CAD s/p 4V CABG with 4/4 patent grafts 2. High grade stenosis distal body of SVG to Diagonal 3. Successful PTCA/DES x 1 distal body of SVG to Diagonal. Placed on DAPT with ASA/plavix  She has severe PAD and had fem pop bypass grafting for critical limb ischemia in the left leg  (11/2013). Per notes, she was admitted to [facility] for CHF in 11/2013. Echocardiogram was completed revealing EF of 55-60% and mild septal hypokinesis.Possible diastolic dysfunction, RV systolic pressure of 52.  Last echo here in March 2015 LVEF was 30%!!  Patient presented to the ED at [facility] with c/o chest pain and SOB. She developed some SOB through the previous night. She got up yesterday morning, made breakfast, had her morning meds (including her blood pressure medication), and then vomited the meds back up. Her SOB got worse throughout the day and she developed chest pain as well. She went to [facility] ED, CXR there showed a moderate R pleural effusion (significantly increased), R basilar atelectasis possible pneumonia, and pulmonary vascular congestion. She was given rocephin, azithromycin, lasix, solumedrol, and nitro paste. Her pro BNP is 3638, her troponin T  is 0.06. Due to elevated cardiac enzymes, she was transferred to [facility] for further management.    Past Medical History  Diagnosis Date  . Mixed hyperlipidemia   . Essential hypertension, benign   . Depression   . Cellulitis     a. Recurrent, bilateral legs  . Carotid artery disease     a. Bilateral CEA; 50-60% bilateral ICA stenosis, 11/12  . PAD (peripheral artery disease)   . Suicide attempt 08/2002  . TIA (transient ischemic attack)   . TMJ syndrome   . Herniated disc   . Ischemic cardiomyopathy     a. 11/2010 TEE: EF 40-45%.  . Critical lower limb ischemia   . Venous insufficiency   . Coronary atherosclerosis of native coronary artery     a. 11/2010 NSTEMI/CABG x 4: L-LAD; S-PL; S-OM; S-DX, by PVT.  [name] NSTEMI (non-ST elevated myocardial infarction) 11/2010  . Pneumonia 2000's X 2  . Type 2 diabetes mellitus   . History of blood transfusion   . Migraine   . Stroke 08/2009  . Arthritis   . Chronic back pain   . On home oxygen therapy   . Chronic diastolic heart failure   . CHF (congestive heart failure)   . Chronic kidney disease      Past Surgical History  Procedure Laterality Date  . Appendectomy    . Cholecystectomy    . Abdominal hysterectomy    . Carotid endarterectomy Bilateral 2011    Bilateral - Dr. Kellie Simmering  . Wrist fracture surgery Left     "grafted bone  from hip to wrist"  . Coronary artery bypass graft  11/24/2010    Procedure: CORONARY ARTERY BYPASS GRAFTING (CABG);  Surgeon: Tharon Aquas Adelene Idler, MD;  Location: [place];  Service: Open Heart Surgery;  Laterality: N/A;  Coronary Artery Bypass Graft times four on pump utilizing left internal mammary artery and bilateral greater saphenous veins harvested endoscopically, transesophageal echocardiogram   . Toe surgery Left     "put pin in 2nd toe"  . Lower extremity angiogram  09/08/2012; 10/06/2013    "found 100% blockage; unsuccessful attempt at crossing a chronic total occlusion of the left SFA in the setting of  critical limb ischemia  . Dilation and curettage of uterus    . Tubal ligation    . Debridement toe Left     "nonhealing wound; 3rd digit  . Cardiac catheterization  11/2010  . Coronary angioplasty with stent placement  03/2013    "1"  . Colonoscopy w/ biopsies and polypectomy    . Fracture surgery    . Femoral-popliteal bypass graft Left 10/07/2013    Procedure: LEFT FEMORAL-POPLITEAL ARTERY BYPASS GRAFT;  Surgeon: Angelia Mould, MD;  Location: [place];  Service: Vascular;  Laterality: Left;  . Intraoperative arteriogram Left 10/07/2013    Procedure: INTRA OPERATIVE ARTERIOGRAM LEFT LEG;  Surgeon: Angelia Mould, MD;  Location: [place];  Service: Vascular;  Laterality: Left;  . Endarterectomy popliteal Left 10/07/2013    Procedure: LEFT POPLITEAL ENDARTERECTOMY ;  Surgeon: Angelia Mould, MD;  Location: [place];  Service: Vascular;  Laterality: Left;  . Left and right heart catheterization with coronary/graft angiogram N/A 03/27/2013    Procedure: LEFT AND RIGHT HEART CATHETERIZATION WITH Beatrix Fetters;  Surgeon: Burnell Blanks, MD;  Location: [facility] CATH LAB;  Service: Cardiovascular;  Laterality: N/A;  . Lower extremity angiogram Bilateral 09/08/2013    Procedure: LOWER EXTREMITY ANGIOGRAM;  Surgeon: [name], MD;  Location: [facility] CATH LAB;  Service: Cardiovascular;  Laterality: Bilateral;    Allergies  Allergen Reactions  . Other Shortness Of Breath, Rash and Other (See Comments)    All berries   . Strawberry Hives, Shortness Of Breath and Rash  . Sulfa Antibiotics Swelling  . Tape Other (See Comments)    Tears skin.  Please use "paper" tape only.    I have reviewed the patient's current medications . amLODipine  10 mg Oral Daily  . antiseptic oral rinse  7 mL Mouth Rinse BID  . aspirin EC  81 mg Oral Daily  . atorvastatin  40 mg Oral q1800  . carvedilol  18.75 mg Oral BID WC  . [START ON 09/05/2014] Chlorhexidine Gluconate Cloth  6 each Topical  Q0600  . citalopram  20 mg Oral Daily  . clopidogrel  75 mg Oral Q breakfast  . furosemide  40 mg Intravenous Q12H  . heparin  5,000 Units Subcutaneous 3 times per day  . hydrALAZINE  25 mg Oral 3 times per day  . [START ON 09/05/2014] Influenza vac split quadrivalent PF  0.5 mL Intramuscular Tomorrow-1000  . insulin aspart  0-9 Units Subcutaneous TID WC  . insulin aspart  4 Units Subcutaneous TID WC  . insulin detemir  22 Units Subcutaneous QHS  . isosorbide mononitrate  30 mg Oral Daily  . lisinopril  20 mg Oral Daily  . mupirocin ointment   Nasal BID  . [START ON 09/05/2014] pneumococcal 23 valent vaccine  0.5 mL Intramuscular Tomorrow-1000  . potassium chloride  10 mEq Oral  Daily  . sodium chloride  3 mL Intravenous Q12H  . Umeclidinium-Vilanterol   Inhalation Daily     sodium chloride, acetaminophen, albuterol, docusate sodium, labetalol, morphine injection, ondansetron (ZOFRAN) IV, sodium chloride  Prior to Admission medications   Medication Sig Start Date End Date Taking? Authorizing Provider  albuterol (PROVENTIL HFA;VENTOLIN HFA) 108 (90 BASE) MCG/ACT inhaler Inhale 2 puffs into the lungs as needed for wheezing or shortness of breath.    Historical Provider, MD  amLODipine (NORVASC) 10 MG tablet Take 1 tablet (10 mg total) by mouth daily. 07/09/14   Satira Sark, MD  aspirin 81 MG EC tablet Take 81 mg by mouth daily. 03/29/13   Brittainy Erie Noe, PA-C  atorvastatin (LIPITOR) 40 MG tablet Take 40 mg by mouth daily at 6 PM. 03/29/13   Brittainy M Rosita Fire, PA-C  carvedilol (COREG) 12.5 MG tablet Take 18.75 mg by mouth 2 (two) times daily. 07/30/13   Satira Sark, MD  citalopram (CELEXA) 20 MG tablet Take 20 mg by mouth daily.    Historical Provider, MD  clopidogrel (PLAVIX) 75 MG tablet Take 1 tablet (75 mg total) by mouth daily with breakfast. 03/16/14   Satira Sark, MD  Docusate Calcium (STOOL SOFTENER PO) Take 2 capsules by mouth daily as needed (for constipation).     Historical Provider, MD  gabapentin (NEURONTIN) 300 MG capsule Take 300 mg by mouth daily. 08/02/14   Historical Provider, MD  glimepiride (AMARYL) 2 MG tablet Take 2 mg by mouth 2 (two) times daily.     Historical Provider, MD  HYDROcodone-acetaminophen (NORCO/VICODIN) 5-325 MG per tablet Take 1 tablet by mouth every 6 (six) hours as needed for moderate pain. 11/10/13   Sharmon Leyden Nickel, NP  insulin aspart (NOVOLOG) 100 UNIT/ML injection Inject 8 Units into the skin 3 (three) times daily with meals. 03/29/13   Brittainy Erie Noe, PA-C  insulin detemir (LEVEMIR) 100 UNIT/ML injection Inject 22 Units into the skin at bedtime.  03/29/13   Brittainy Erie Noe, PA-C  isosorbide mononitrate (IMDUR) 30 MG 24 hr tablet Take 30 mg by mouth daily.    Historical Provider, MD  lisinopril (PRINIVIL,ZESTRIL) 20 MG tablet Take 1 tablet (20 mg total) by mouth daily. 06/01/14   Satira Sark, MD  Multiple Vitamin (MULTIVITAMIN WITH MINERALS) TABS tablet Take 1 tablet by mouth daily.    Historical Provider, MD  nitroGLYCERIN (NITROSTAT) 0.4 MG SL tablet Place 1 tablet (0.4 mg total) under the tongue every 5 (five) minutes as needed for chest pain. Up to 3 doses. If no relief after 3rd dose, proceed to the ED for an evaluation 07/30/13   Satira Sark, MD  potassium chloride (K-DUR) 10 MEQ tablet Take 1 tablet (10 mEq total) by mouth daily. 06/25/14   Satira Sark, MD  PRESCRIPTION MEDICATION 2 liters of Oxygen    Historical Provider, MD  torsemide (DEMADEX) 20 MG tablet Take 40 mg in the am only per [facility] 02/13/14 02/13/14   Satira Sark, MD  torsemide (DEMADEX) 20 MG tablet TAKE TWO TABLETS BY MOUTH IN THE MORNING AND ONE IN THE EVENING 07/06/14   Satira Sark, MD  Umeclidinium-Vilanterol 62.5-25 MCG/INH AEPB Inhale into the lungs daily.    Historical Provider, MD     Social History   Social History  . Marital Status: Widowed    Spouse Name: N/A  . Number of Children: N/A  . Years of Education: N/A     Occupational History  .  Not on file.   Social History Main Topics  . Smoking status: Former Smoker -- 3.00 packs/day for 50 years    Types: Cigarettes    Start date: 01/24/1956    Quit date: 10/09/2009  . Smokeless tobacco: Never Used  . Alcohol Use: 0.0 oz/week    0 Standard drinks or equivalent per week     Comment: 10/06/2013 "might have a drink q 2-3 months"  . Drug Use: Yes    Special: Marijuana     Comment: "smoked pot in my teens"  . Sexual Activity: Not Currently   Other Topics Concern  . Not on file   Social History Narrative   Lives in [place] by herself.  She does not work.    No family status information on file.   Family History  Problem Relation Age of Onset  . Coronary artery disease Father     Died with MI age 6  . Heart disease Father   . Heart attack Father   . Diabetes type II Mother   . Hypertension Mother   . Diabetes Mother   . Heart disease Mother   . Hyperlipidemia Mother   . Heart failure Sister   . Cancer Sister   . Cancer Brother     Lung cancer  . Diabetes Daughter   . Hyperlipidemia Daughter   . Diabetes Son   . Hyperlipidemia Son      ROS:  Full 14 point review of systems complete and found to be negative unless listed above.  Physical Exam: Blood pressure 154/74, pulse 63, temperature 98.4 F (36.9 C), temperature source Oral, resp. rate 18, height _0  (1.676 m), weight 89.9 kg (198 lb 3.1 oz), SpO2 97 %.  General: Well developed, well nourished, female in no acute distress Head: Eyes PERRLA, No xanthomas.   Normocephalic and atraumatic, oropharynx without edema or exudate.   Lungs: bibasilar crackles  Heart: HRRR S1 S2, no rub/gallop, Heart irregular rate and rhythm with S1, S2  murmur. pulses are 2+ extrem.   Neck: No carotid bruits. No lymphadenopathy.  JVP is increased   Abdomen: MIld RUQ tenderness. No definite hepatomegaly  Normal bowel sounds.   Msk:  No spine or cva tenderness. No weakness, no joint deformities or  effusions. Extremities: No clubbing or cyanosis.   Trace  edema.  Neuro: Alert and oriented X 3. No focal deficits noted. Psych:  Good affect, responds appropriately Skin: No rashes or lesions noted.  Labs:   Lab Results  Component Value Date   WBC 8.3 10/08/2013   HGB 8.9* 10/08/2013   HCT 28.3* 10/08/2013   MCV 87.6 10/08/2013   PLT 159 10/08/2013   No results for input(s): INR in the last 72 hours.  Recent Labs Lab 09/04/14 0510  NA 137  K 4.2  CL 100*  CO2 29  BUN 34*  CREATININE 1.17*  CALCIUM 9.2  GLUCOSE 266*   MAGNESIUM  Date Value Ref Range Status  11/25/2010 2.7* 1.5 - 2.5 mg/dL Final    Recent Labs  09/04/14 09/04/14 0510 09/04/14 1142  TROPONINI 0.03 0.04* 0.03     Echo: Study Date: 03/18/2013 LV EF: 30% Study Conclusions - Left ventricle: The cavity size was moderately dilated. Wall thickness was normal. The estimated ejection fraction was 30%. Diffuse hypokinesis. - Left atrium: The atrium was moderately dilated. - Atrial septum: No defect or patent foramen ovale was identified. - Pulmonary arteries: PA peak pressure: 18m Hg (S). - Impressions: Elevated LA pressure  by E/E' Impressions: - Elevated LA pressure by E/E'      Cardiac Catheterization Operative Report Procedure Performed:  1. Left Heart Catheterization 2. Selective Coronary Angiography 3. Right Heart Catheterization 4. PTCA/DES x 1 distal body of SVG to Diagonal Hemodynamic Findings: Ao: 173/71  LV: 172/9/20 RA: 7  RV: 41/2/6 PA: 46/18 (mean 24)  PCWP: 9 Fick Cardiac Output: 4.88 L/min Fick Cardiac Index: 2.48 L/min/m2 Central Aortic Saturation: 99% Pulmonary Artery Saturation: 65% Angiographic Findings: Left main: No left main. The LAD and Circumflex arise from different ostia. Left Anterior Descending Artery: Large caliber vessel with diffuse 40% proximal and mid stenosis followed by 99% sub-total occlusion mid vessel. The mid and  distal vessel fills from the patent IMA graft. The moderate caliber diagonal branch has proximal 90% stenosis.  Circumflex Artery: Moderate caliber diffusely diseased vessel with serial 99% stenoses throughout the proximal and mid segment. The obtuse marginal branch has an ostial 99% stenosis and fills retrograde from a patent vein graft.  Right Coronary Artery: Moderate caliber diffusely diseased vessel with 80% stenosis throughout the proximal and mid segments. The distal vessel fills from antegrade flow and from the patent vein graft.  Graft Anatomy:  SVG to Diagonal is patent with 90% stenosis distal body of graft SVG to OM is patent SVG to PDA is patent LIMA to mid LAD is patent Left Ventricular Angiogram: Deferred.  Impression: 1. Severe triple vessel CAD s/p 4V CABG with 4/4 patent grafts 2. High grade stenosis distal body of SVG to Diagonal 3. Successful PTCA/DES x 1 distal body of SVG to Diagonal Recommendations: She will need ASA and Plavix for one year. She seems to be relatively euvolemic. Further plans per rounding team.        Radiology:  Dg Chest 2 View  09/04/2014   CLINICAL DATA:  Shortness of breath, hypoxia, history of CHF, CABG, and respiratory failure, history of previous heavy tobacco use.  EXAM: CHEST  2 VIEW  COMPARISON:  Portable chest x-ray of September 03, 2014  FINDINGS: There is persistent moderate-sized right pleural effusion. Right basilar atelectasis or pneumonia is suspected. The interstitial markings of both lungs are increased. There is subsegmental atelectasis in the lingula. No left pleural effusion is observed. The cardiac silhouette is enlarged. There are post CABG changes. The bony thorax exhibits no acute abnormality.  IMPRESSION: CHF with mild pulmonary interstitial edema and moderate-sized right-sided pleural effusion. Underlying atelectasis or pneumonia in the lower right lung is suspected. Continued follow-up radiographs are recommended to assure  positive response to therapy.   Electronically Signed   By: David  Martinique M.D.   On: 09/04/2014 08:38    ASSESSMENT AND PLAN:    Principal Problem:   Hypertensive urgency Active Problems:   Hypertension, uncontrolled   Diabetes mellitus type II, uncontrolled   Acute on chronic combined systolic and diastolic heart failure   Pleural effusion, right   Acute on chronic respiratory failure with hypoxia   Pressure ulcer   CHF (congestive heart failure)  ERIS HANNAN is a 69 y.o. female with a history of CAD s/p CABG in 2012 and DES to SVG--> D1 (03/2013), COPD on nocturnal 02, HLD, HTN, PVD, carotid artery disease s/p bilateral CEA, CVA and chronic combined systolic and diastolic CHF who was transferred from [facility] on 09/03/14 for acute on chronic CHF, hypertensive urgency and elevated troponin.  Uncontrolled HTN -- BP up to 229/147 on arrival to [facility]. Now much better controlled hydralazine and IV labetalol  --  Continue amlodipine 34m , hydralazine 232mTID, coreg 18.7539mID, lisinopril 74m9m   Acute on chronic CHF - likely due to uncontrolled HTN  -- Record review reveals last EF had improved from 30 to 55% [facility]. Repeat ECHO pending here  -- Currently on IV Lasix 40mg84m. Net neg 1.6L. Weight stable at 198 lbs. Creat mildly elevated. Continue to monitor.   CAD/CABG- troponin 0.03--> 0.04--> 0.03.  -- Likely demand ischemia in the setting of HTN urgency and acute CHF.    No evidence for ongoing ischemia   -- Continue ASA/plavix/coreg/statin/imdur -- Would not plan further testing now.  Possible noninvasive testing as outpt  Await echo.    DM -- Per IM  Dyslipidemia -- Continue statin   Signed: THOMPCrista Luria/2016 12:58 PM  Pager [phone] MD  Patient seen and examined  I agree with findings as noted above by K ThoKathlene [facility] HTN out of control and CHF.  Agree with diuresis  BP is  improved  Titrate meds as needed   Question if volume increase exacertbated HTN then more volume increase or if BP increase exacerbated CHF.  I am not convinced of ischemia driving above picture.  Echo pending.    PaulaDorris Carnes

## 2014-09-04 NOTE — Progress Notes (Addendum)
[facility] PROGRESS NOTE  [name] [name] CVE:[id number] DOB: 1945/08/03 DOA: 09/03/2014 PCP: Glenda Chroman., MD  Assessment/Plan: 1. Acute on chronic resp failure -due to Systolic and diastolic CHF -Diurese with IV lasix -per Clinic notes, last EF had improved from 30 to 55% based on ECHo at [place] consulted -? Pleural effusion based on CXR at outside hospital, will repeat CXR here and evaluate need for thoracentesis  2. CAD/CABG -last PCI and stent in 3/15 -continue ASA/plavix/coreg/statin/imdur  3. DM -increase levemir, meal coverage and SSI  4. Uncontrolled HTN -likely related to volume overloaded state -diuretics -continue amlodipine, hydralazine, coreg, lisinopril  5. COPD/chronic resp failure -on home O2, stable, -nebs PRN  6. Dyslipidemia -continue statin  DVt proph: hep SQ  Code Status: DNR Family Communication: none at bedside Disposition Plan: Keep in SDU   Consultants:  Cards  HPI/Subjective: Some dyspnea, no chest pain now  Objective: Filed Vitals:   09/04/14 1122  BP: 154/74  Pulse:   Temp: 98.4 F (36.9 C)  Resp: 18    Intake/Output Summary (Last 24 hours) at 09/04/14 1149 Last data filed at 09/04/14 1052  Gross per 24 hour  Intake    240 ml  Output   1775 ml  Net  -1535 ml   Filed Weights   09/03/14 2334 09/04/14 0400  Weight: 89.9 kg (198 lb 3.1 oz) 89.9 kg (198 lb 3.1 oz)    Exam:   General:  AAOx3  Cardiovascular: S1S2/RRR  Respiratory: decreased BS on R  Abdomen: soft, nT, BS present  Musculoskeletal: 1-2 plus edema, scars from prior surgery   Data Reviewed: Basic Metabolic Panel:  Recent Labs Lab 09/04/14 0510  NA 137  K 4.2  CL 100*  CO2 29  GLUCOSE 266*  BUN 34*  CREATININE 1.17*  CALCIUM 9.2   Liver Function Tests: No results for input(s): AST, ALT, ALKPHOS, BILITOT, PROT, ALBUMIN in the last 168 hours. No results for input(s): LIPASE, AMYLASE in the last 168 hours. No results for  input(s): AMMONIA in the last 168 hours. CBC: No results for input(s): WBC, NEUTROABS, HGB, HCT, MCV, PLT in the last 168 hours. Cardiac Enzymes:  Recent Labs Lab 09/04/14 09/04/14 0510  TROPONINI 0.03 0.04*   BNP (last 3 results) No results for input(s): BNP in the last 8760 hours.  ProBNP (last 3 results) No results for input(s): PROBNP in the last 8760 hours.  CBG:  Recent Labs Lab 09/03/14 2331 09/04/14 0757  GLUCAP 238* 238*    Recent Results (from the past 240 hour(s))  MRSA PCR Screening     Status: Abnormal   Collection Time: 09/03/14 10:59 PM  Result Value Ref Range Status   MRSA by PCR POSITIVE (A) NEGATIVE Final    Comment:        The GeneXpert MRSA Assay (FDA approved for NASAL specimens only), is one component of a comprehensive MRSA colonization surveillance program. It is not intended to diagnose MRSA infection nor to guide or monitor treatment for MRSA infections. RESULT CALLED TO, READ BACK BY AND VERIFIED WITH: H YANBU _0  09/04/14      Studies: Dg Chest 2 View  09/04/2014   CLINICAL DATA:  Shortness of breath, hypoxia, history of CHF, CABG, and respiratory failure, history of previous heavy tobacco use.  EXAM: CHEST  2 VIEW  COMPARISON:  Portable chest x-ray of September 03, 2014  FINDINGS: There is persistent moderate-sized right pleural effusion. Right basilar atelectasis or pneumonia is suspected. The interstitial markings  of both lungs are increased. There is subsegmental atelectasis in the lingula. No left pleural effusion is observed. The cardiac silhouette is enlarged. There are post CABG changes. The bony thorax exhibits no acute abnormality.  IMPRESSION: CHF with mild pulmonary interstitial edema and moderate-sized right-sided pleural effusion. Underlying atelectasis or pneumonia in the lower right lung is suspected. Continued follow-up radiographs are recommended to assure positive response to therapy.   Electronically Signed   By: [name]   [name] M.D.   On: 09/04/2014 08:38    Scheduled Meds: . amLODipine  10 mg Oral Daily  . antiseptic oral rinse  7 mL Mouth Rinse BID  . aspirin EC  81 mg Oral Daily  . atorvastatin  40 mg Oral q1800  . carvedilol  18.75 mg Oral BID WC  . [START ON 09/05/2014] Chlorhexidine Gluconate Cloth  6 each Topical Q0600  . citalopram  20 mg Oral Daily  . clopidogrel  75 mg Oral Q breakfast  . furosemide  40 mg Intravenous Q12H  . heparin  5,000 Units Subcutaneous 3 times per day  . hydrALAZINE  25 mg Oral 3 times per day  . [START ON 09/05/2014] Influenza vac split quadrivalent PF  0.5 mL Intramuscular Tomorrow-1000  . insulin aspart  0-9 Units Subcutaneous TID WC  . insulin aspart  4 Units Subcutaneous TID WC  . insulin detemir  18 Units Subcutaneous QHS  . isosorbide mononitrate  30 mg Oral Daily  . lisinopril  20 mg Oral Daily  . mupirocin ointment   Nasal BID  . [START ON 09/05/2014] pneumococcal 23 valent vaccine  0.5 mL Intramuscular Tomorrow-1000  . potassium chloride  10 mEq Oral Daily  . sodium chloride  3 mL Intravenous Q12H  . Umeclidinium-Vilanterol   Inhalation Daily   Continuous Infusions:  Antibiotics Given (last 72 hours)    None      Principal Problem:   Hypertensive urgency Active Problems:   Hypertension, uncontrolled   Diabetes mellitus type II, uncontrolled   Acute on chronic combined systolic and diastolic heart failure   Pleural effusion, right   Acute on chronic respiratory failure with hypoxia   Pressure ulcer   CHF (congestive heart failure)    Time spent: 41mn    [name]  [facility] Pager [phone] If 7PM-7AM, please contact night-coverage at [email or web address], password [email or web address]8/26/2016, 11:49 AM  LOS: 1 day

## 2014-09-04 NOTE — Progress Notes (Signed)
  Echocardiogram 2D Echocardiogram has been performed.  [name] [name] 09/04/2014, 4:25 PM

## 2014-09-05 ENCOUNTER — Inpatient Hospital Stay (HOSPITAL_COMMUNITY): Payer: Medicare Other

## 2014-09-05 DIAGNOSIS — I5043 Acute on chronic combined systolic (congestive) and diastolic (congestive) heart failure: Secondary | ICD-10-CM | POA: Diagnosis not present

## 2014-09-05 DIAGNOSIS — R0602 Shortness of breath: Secondary | ICD-10-CM

## 2014-09-05 LAB — BASIC METABOLIC PANEL
ANION GAP: 6 (ref 5–15)
BUN: 48 mg/dL — ABNORMAL HIGH (ref 6–20)
CALCIUM: 8.5 mg/dL — AB (ref 8.9–10.3)
CHLORIDE: 100 mmol/L — AB (ref 101–111)
CO2: 30 mmol/L (ref 22–32)
Creatinine, Ser: 1.32 mg/dL — ABNORMAL HIGH (ref 0.44–1.00)
GFR calc non Af Amer: 41 mL/min — ABNORMAL LOW (ref >60)
GFR, EST AFRICAN AMERICAN: 47 mL/min — AB (ref >60)
Glucose, Bld: 151 mg/dL — ABNORMAL HIGH (ref 65–99)
Potassium: 4.4 mmol/L (ref 3.5–5.1)
SODIUM: 136 mmol/L (ref 135–145)

## 2014-09-05 LAB — CBC
HCT: 29.2 % — ABNORMAL LOW (ref 36.0–46.0)
HEMOGLOBIN: 9.2 g/dL — AB (ref 12.0–15.0)
MCH: 28.6 pg (ref 26.0–34.0)
MCHC: 31.5 g/dL (ref 30.0–36.0)
MCV: 90.7 fL (ref 78.0–100.0)
Platelets: 170 10*3/uL (ref 150–400)
RBC: 3.22 MIL/uL — AB (ref 3.87–5.11)
RDW: 16.2 % — ABNORMAL HIGH (ref 11.5–15.5)
WBC: 7.2 10*3/uL (ref 4.0–10.5)

## 2014-09-05 LAB — GLUCOSE, CAPILLARY
GLUCOSE-CAPILLARY: 137 mg/dL — AB (ref 65–99)
GLUCOSE-CAPILLARY: 123 mg/dL — AB (ref 65–99)
GLUCOSE-CAPILLARY: 145 mg/dL — AB (ref 65–99)
Glucose-Capillary: 188 mg/dL — ABNORMAL HIGH (ref 65–99)

## 2014-09-05 MED ORDER — TECHNETIUM TO 99M ALBUMIN AGGREGATED
5.4000 | Freq: Once | INTRAVENOUS | Status: AC | PRN
  Administered 2014-09-05: 5 via INTRAVENOUS

## 2014-09-05 MED ORDER — TECHNETIUM TC 99M DIETHYLENETRIAME-PENTAACETIC ACID: 39.5000 | Freq: Once | INTRAVENOUS | Status: DC | PRN

## 2014-09-05 MED ORDER — ENOXAPARIN SODIUM 80 MG/0.8ML ~~LOC~~ SOLN
80.0000 mg | Freq: Two times a day (BID) | SUBCUTANEOUS | Status: DC
  Administered 2014-09-05 – 2014-09-06 (×2): 80 mg via SUBCUTANEOUS
  Filled 2014-09-05 (×2): qty 0.8

## 2014-09-05 MED ORDER — ENOXAPARIN SODIUM 80 MG/0.8ML ~~LOC~~ SOLN: 80.0000 mg | Freq: Two times a day (BID) | SUBCUTANEOUS | Status: DC

## 2014-09-05 NOTE — Progress Notes (Signed)
Patient ID: [name] [name], female   DOB: February 25, 1945, 69 y.o.   MRN: [id number]    Patient Name: [name] [name] Date of Encounter: 09/05/2014     Principal Problem:   Hypertensive urgency Active Problems:   Hypertension, uncontrolled   Diabetes mellitus type II, uncontrolled   Acute on chronic combined systolic and diastolic heart failure   Pleural effusion, right   Acute on chronic respiratory failure with hypoxia   Pressure ulcer   CHF (congestive heart failure)    SUBJECTIVE  No chest pain or sob. One episode of vomiting. No HA.  CURRENT MEDS . amLODipine  10 mg Oral Daily  . antiseptic oral rinse  7 mL Mouth Rinse BID  . aspirin EC  81 mg Oral Daily  . atorvastatin  40 mg Oral q1800  . carvedilol  18.75 mg Oral BID WC  . Chlorhexidine Gluconate Cloth  6 each Topical Q0600  . citalopram  20 mg Oral Daily  . clopidogrel  75 mg Oral Q breakfast  . furosemide  40 mg Intravenous Q12H  . gabapentin  300 mg Oral Daily  . heparin  5,000 Units Subcutaneous 3 times per day  . hydrALAZINE  25 mg Oral 3 times per day  . Influenza vac split quadrivalent PF  0.5 mL Intramuscular Tomorrow-1000  . insulin aspart  0-9 Units Subcutaneous TID WC  . insulin aspart  4 Units Subcutaneous TID WC  . insulin detemir  22 Units Subcutaneous QHS  . isosorbide mononitrate  30 mg Oral Daily  . lisinopril  20 mg Oral Daily  . multivitamin with minerals  1 tablet Oral Daily  . mupirocin ointment   Nasal BID  . pneumococcal 23 valent vaccine  0.5 mL Intramuscular Tomorrow-1000  . potassium chloride  10 mEq Oral Daily  . sodium chloride  3 mL Intravenous Q12H  . Umeclidinium-Vilanterol   Inhalation Daily    OBJECTIVE  Filed Vitals:   09/05/14 0300 09/05/14 0400 09/05/14 0646 09/05/14 0802  BP:  144/38 172/79 170/55  Pulse:  52  63  Temp: 97.5 F (36.4 C)   97.5 F (36.4 C)  TempSrc: Oral   Oral  Resp:    17  Height:      Weight: 190 lb 14.7 oz (86.6 kg)     SpO2:  91% 91% 92%     Intake/Output Summary (Last 24 hours) at 09/05/14 1022 Last data filed at 09/05/14 0800  Gross per 24 hour  Intake    720 ml  Output   1920 ml  Net  -1200 ml   Filed Weights   09/03/14 2334 09/04/14 0400 09/05/14 0300  Weight: 198 lb 3.1 oz (89.9 kg) 198 lb 3.1 oz (89.9 kg) 190 lb 14.7 oz (86.6 kg)    PHYSICAL EXAM  General: Pleasant, NAD. Neuro: Alert and oriented X 3. Moves all extremities spontaneously. Psych: Normal affect. HEENT:  Normal  Neck: Supple without bruits or JVD. Lungs:  Resp regular and unlabored, CTA. Heart: RRR no s3, s4, or murmurs. Abdomen: Soft, non-tender, non-distended, BS + x 4.  Extremities: No clubbing, cyanosis or edema. DP/PT/Radials 2+ and equal bilaterally.  Accessory Clinical Findings  CBC  Recent Labs  09/05/14 0254  WBC 7.2  HGB 9.2*  HCT 29.2*  MCV 90.7  PLT 254   Basic Metabolic Panel  Recent Labs  09/04/14 0510 09/05/14 0254  NA 137 136  K 4.2 4.4  CL 100* 100*  CO2 29 30  GLUCOSE 266*  151*  BUN 34* 48*  CREATININE 1.17* 1.32*  CALCIUM 9.2 8.5*   Liver Function Tests No results for input(s): AST, ALT, ALKPHOS, BILITOT, PROT, ALBUMIN in the last 72 hours. No results for input(s): LIPASE, AMYLASE in the last 72 hours. Cardiac Enzymes  Recent Labs  09/04/14 09/04/14 0510 09/04/14 1142  TROPONINI 0.03 0.04* 0.03   BNP Invalid input(s): POCBNP D-Dimer No results for input(s): DDIMER in the last 72 hours. Hemoglobin A1C No results for input(s): HGBA1C in the last 72 hours. Fasting Lipid Panel No results for input(s): CHOL, HDL, LDLCALC, TRIG, CHOLHDL, LDLDIRECT in the last 72 hours. Thyroid Function Tests No results for input(s): TSH, T4TOTAL, T3FREE, THYROIDAB in the last 72 hours.  Invalid input(s): FREET3  TELE  nsr  Radiology/Studies  Dg Chest 2 View  09/04/2014   CLINICAL DATA:  Shortness of breath, hypoxia, history of CHF, CABG, and respiratory failure, history of previous heavy tobacco use.   EXAM: CHEST  2 VIEW  COMPARISON:  Portable chest x-ray of September 03, 2014  FINDINGS: There is persistent moderate-sized right pleural effusion. Right basilar atelectasis or pneumonia is suspected. The interstitial markings of both lungs are increased. There is subsegmental atelectasis in the lingula. No left pleural effusion is observed. The cardiac silhouette is enlarged. There are post CABG changes. The bony thorax exhibits no acute abnormality.  IMPRESSION: CHF with mild pulmonary interstitial edema and moderate-sized right-sided pleural effusion. Underlying atelectasis or pneumonia in the lower right lung is suspected. Continued follow-up radiographs are recommended to assure positive response to therapy.   Electronically Signed   By: David  Martinique M.D.   On: 09/04/2014 08:38    ASSESSMENT AND PLAN  1. HTN urgency - she is improved. Would increase coreg to 25 mg twice daily. Continue other meds.  2. CAD - no evidence of an acute coronary syndrome. No additional treatment indicated   Gregg Taylor,M.D.  09/05/2014 10:22 AM

## 2014-09-05 NOTE — Progress Notes (Signed)
ANTICOAGULATION CONSULT NOTE - Initial Consult  Pharmacy Consult for  Lovenox Indication: pulmonary embolus  Allergies  Allergen Reactions  . Other Shortness Of Breath, Rash and Other (See Comments)    All berries   . Strawberry Hives, Shortness Of Breath and Rash  . Sulfa Antibiotics Swelling  . Tape Other (See Comments)    Tears skin.  Please use "paper" tape only.    Patient Measurements: Height: 5' 6" (167.6 cm) Weight: 190 lb 14.7 oz (86.6 kg) IBW/kg (Calculated) : 59.3  Vital Signs: Temp: 97.5 F (36.4 C) (08/27 0802) Temp Source: Oral (08/27 0802) BP: 170/55 mmHg (08/27 0802) Pulse Rate: 63 (08/27 0802)  Labs:  Recent Labs  09/04/14 09/04/14 0510 09/04/14 1142 09/05/14 0254  HGB  --   --   --  9.2*  HCT  --   --   --  29.2*  PLT  --   --   --  170  CREATININE  --  1.17*  --  1.32*  TROPONINI 0.03 0.04* 0.03  --     Estimated Creatinine Clearance: 45.8 mL/min (by C-G formula based on Cr of 1.32).   Medical History: Past Medical History  Diagnosis Date  . Mixed hyperlipidemia   . Essential hypertension, benign   . Depression   . Cellulitis     a. Recurrent, bilateral legs  . Carotid artery disease     a. Bilateral CEA; 50-60% bilateral ICA stenosis, 11/12  . PAD (peripheral artery disease)   . Suicide attempt 08/2002  . TIA (transient ischemic attack)   . TMJ syndrome   . Herniated disc   . Ischemic cardiomyopathy     a. 11/2010 TEE: EF 40-45%.  . Critical lower limb ischemia   . Venous insufficiency   . Coronary atherosclerosis of native coronary artery     a. 11/2010 NSTEMI/CABG x 4: L-LAD; S-PL; S-OM; S-DX, by PVT.  [name] NSTEMI (non-ST elevated myocardial infarction) 11/2010  . Pneumonia 2000's X 2  . Type 2 diabetes mellitus   . History of blood transfusion   . Migraine   . Stroke 08/2009  . Arthritis   . Chronic back pain   . On home oxygen therapy   . Chronic diastolic heart failure   . CHF (congestive heart failure)   . Chronic kidney  disease    }  Assessment: 69 yo f admitted with SOB and HTN urgency.   VQ scan showed intermediate possibility of PE, so pharmacy is consulted to dose Lovenox for PE. CBC stable - hgb 9.2, plts 170.  SCr 1.17>1.32, CrCl ~ 46 ml/min.   Goal of Therapy:  Monitor platelets by anticoagulation protocol: Yes   Plan:  Lovenox 80 mg sq q12h CBC q72h Monitor renal fx closely  Philis Doke L. Nicole Kindred, PharmD Clinical Pharmacy Resident Pager: [phone] 09/05/2014 2:30 PM

## 2014-09-05 NOTE — Progress Notes (Addendum)
[facility] PROGRESS NOTE  [name] [name] UEK:[id number] DOB: 02-18-1945 DOA: 09/03/2014 PCP: [name] Chroman., MD  Assessment/Plan: 1. Acute on chronic resp failure -due to Systolic and diastolic CHF with R sided pleural effusion -Diurese with IV lasix -ECHo 8/26 with normal EF, grade 3 DD and Severe PAH, peak PA pressure of 15mHg-could be due to COPD/OSA and CHF/pleural effusions -Cards following -given significant change in PA pressures will check VQ scan and LE dopplers, avoid CTA due to mild AKI  2. CAD/CABG -last PCI and stent in 3/15 -continue ASA/plavix/coreg/statin/imdur  3. DM -continue levemir, meal coverage and SSI  4. Uncontrolled HTN -likely related to volume overloaded state -diuretics -continue amlodipine, hydralazine, coreg, ACE stopped due to AKI  5. COPD/chronic resp failure -on home O2 2l, no wheezing -nebs PRN  6. Dyslipidemia -continue statin  DVt proph: hep SQ  Code Status: DNR Family Communication: none at bedside Disposition Plan: Keep in SDU   Consultants:  Cards  HPI/Subjective: Some dyspnea, no chest pain now  Objective: Filed Vitals:   09/05/14 0802  BP: 170/55  Pulse: 63  Temp: 97.5 F (36.4 C)  Resp: 17    Intake/Output Summary (Last 24 hours) at 09/05/14 1038 Last data filed at 09/05/14 0800  Gross per 24 hour  Intake    720 ml  Output   1920 ml  Net  -1200 ml   Filed Weights   09/03/14 2334 09/04/14 0400 09/05/14 0300  Weight: 89.9 kg (198 lb 3.1 oz) 89.9 kg (198 lb 3.1 oz) 86.6 kg (190 lb 14.7 oz)    Exam:   General:  AAOx3  Cardiovascular: S1S2/RRR  Respiratory: decreased BS on R  Abdomen: soft, nT, BS present  Musculoskeletal: 1-2 plus edema, scars from prior surgery   Data Reviewed: Basic Metabolic Panel:  Recent Labs Lab 09/04/14 0510 09/05/14 0254  NA 137 136  K 4.2 4.4  CL 100* 100*  CO2 29 30  GLUCOSE 266* 151*  BUN 34* 48*  CREATININE 1.17* 1.32*  CALCIUM 9.2 8.5*   Liver  Function Tests: No results for input(s): AST, ALT, ALKPHOS, BILITOT, PROT, ALBUMIN in the last 168 hours. No results for input(s): LIPASE, AMYLASE in the last 168 hours. No results for input(s): AMMONIA in the last 168 hours. CBC:  Recent Labs Lab 09/05/14 0254  WBC 7.2  HGB 9.2*  HCT 29.2*  MCV 90.7  PLT 170   Cardiac Enzymes:  Recent Labs Lab 09/04/14 09/04/14 0510 09/04/14 1142  TROPONINI 0.03 0.04* 0.03   BNP (last 3 results) No results for input(s): BNP in the last 8760 hours.  ProBNP (last 3 results) No results for input(s): PROBNP in the last 8760 hours.  CBG:  Recent Labs Lab 09/04/14 0757 09/04/14 1121 09/04/14 1605 09/04/14 1627 09/04/14 2134  GLUCAP 238* 225* 59* 73 88    Recent Results (from the past 240 hour(s))  MRSA PCR Screening     Status: Abnormal   Collection Time: 09/03/14 10:59 PM  Result Value Ref Range Status   MRSA by PCR POSITIVE (A) NEGATIVE Final    Comment:        The GeneXpert MRSA Assay (FDA approved for NASAL specimens only), is one component of a comprehensive MRSA colonization surveillance program. It is not intended to diagnose MRSA infection nor to guide or monitor treatment for MRSA infections. RESULT CALLED TO, READ BACK BY AND VERIFIED WITH: [name] [name] _0  09/04/14      Studies: Dg Chest 2 View  09/04/2014  CLINICAL DATA:  Shortness of breath, hypoxia, history of CHF, CABG, and respiratory failure, history of previous heavy tobacco use.  EXAM: CHEST  2 VIEW  COMPARISON:  Portable chest x-ray of September 03, 2014  FINDINGS: There is persistent moderate-sized right pleural effusion. Right basilar atelectasis or pneumonia is suspected. The interstitial markings of both lungs are increased. There is subsegmental atelectasis in the lingula. No left pleural effusion is observed. The cardiac silhouette is enlarged. There are post CABG changes. The bony thorax exhibits no acute abnormality.  IMPRESSION: CHF with mild pulmonary  interstitial edema and moderate-sized right-sided pleural effusion. Underlying atelectasis or pneumonia in the lower right lung is suspected. Continued follow-up radiographs are recommended to assure positive response to therapy.   Electronically Signed   By: [name]  [name] M.D.   On: 09/04/2014 08:38    Scheduled Meds: . amLODipine  10 mg Oral Daily  . antiseptic oral rinse  7 mL Mouth Rinse BID  . aspirin EC  81 mg Oral Daily  . atorvastatin  40 mg Oral q1800  . carvedilol  18.75 mg Oral BID WC  . Chlorhexidine Gluconate Cloth  6 each Topical Q0600  . citalopram  20 mg Oral Daily  . clopidogrel  75 mg Oral Q breakfast  . furosemide  40 mg Intravenous Q12H  . gabapentin  300 mg Oral Daily  . heparin  5,000 Units Subcutaneous 3 times per day  . hydrALAZINE  25 mg Oral 3 times per day  . Influenza vac split quadrivalent PF  0.5 mL Intramuscular Tomorrow-1000  . insulin aspart  0-9 Units Subcutaneous TID WC  . insulin aspart  4 Units Subcutaneous TID WC  . insulin detemir  22 Units Subcutaneous QHS  . isosorbide mononitrate  30 mg Oral Daily  . multivitamin with minerals  1 tablet Oral Daily  . mupirocin ointment   Nasal BID  . pneumococcal 23 valent vaccine  0.5 mL Intramuscular Tomorrow-1000  . potassium chloride  10 mEq Oral Daily  . sodium chloride  3 mL Intravenous Q12H  . Umeclidinium-Vilanterol   Inhalation Daily   Continuous Infusions:  Antibiotics Given (last 72 hours)    None      Principal Problem:   Hypertensive urgency Active Problems:   Hypertension, uncontrolled   Diabetes mellitus type II, uncontrolled   Acute on chronic combined systolic and diastolic heart failure   Pleural effusion, right   Acute on chronic respiratory failure with hypoxia   Pressure ulcer   CHF (congestive heart failure)    Time spent: 8mn    [name] [name]  [facility] Pager [phone] If 7PM-7AM, please contact night-coverage at [email or web address], password [facility]8/27/2016,  10:38 AM  LOS: 2 days

## 2014-09-05 NOTE — Progress Notes (Signed)
*  PRELIMINARY RESULTS* Vascular Ultrasound Lower extremity venous dupex has been completed.  Preliminary findings: negative for DVT  Landry Mellow, RDMS, RVT  09/05/2014, 11:42 AM

## 2014-09-06 ENCOUNTER — Inpatient Hospital Stay (HOSPITAL_COMMUNITY): Payer: Medicare Other

## 2014-09-06 DIAGNOSIS — J948 Other specified pleural conditions: Secondary | ICD-10-CM

## 2014-09-06 LAB — BODY FLUID CELL COUNT WITH DIFFERENTIAL
Lymphs, Fluid: 41 %
MONOCYTE-MACROPHAGE-SEROUS FLUID: 42 % — AB (ref 50–90)
NEUTROPHIL FLUID: 17 % (ref 0–25)
WBC FLUID: 226 uL (ref 0–1000)

## 2014-09-06 LAB — CBC
HEMATOCRIT: 29.8 % — AB (ref 36.0–46.0)
Hemoglobin: 9.4 g/dL — ABNORMAL LOW (ref 12.0–15.0)
MCH: 29.2 pg (ref 26.0–34.0)
MCHC: 31.5 g/dL (ref 30.0–36.0)
MCV: 92.5 fL (ref 78.0–100.0)
PLATELETS: 164 10*3/uL (ref 150–400)
RBC: 3.22 MIL/uL — AB (ref 3.87–5.11)
RDW: 15.8 % — AB (ref 11.5–15.5)
WBC: 6.7 10*3/uL (ref 4.0–10.5)

## 2014-09-06 LAB — GLUCOSE, CAPILLARY
GLUCOSE-CAPILLARY: 65 mg/dL (ref 65–99)
Glucose-Capillary: 75 mg/dL (ref 65–99)
Glucose-Capillary: 84 mg/dL (ref 65–99)
Glucose-Capillary: 86 mg/dL (ref 65–99)
Glucose-Capillary: 145 mg/dL — ABNORMAL HIGH (ref 65–99)

## 2014-09-06 LAB — BASIC METABOLIC PANEL
Anion gap: 7 (ref 5–15)
BUN: 48 mg/dL — AB (ref 6–20)
CHLORIDE: 100 mmol/L — AB (ref 101–111)
CO2: 29 mmol/L (ref 22–32)
Calcium: 8.6 mg/dL — ABNORMAL LOW (ref 8.9–10.3)
Creatinine, Ser: 1.43 mg/dL — ABNORMAL HIGH (ref 0.44–1.00)
GFR, EST AFRICAN AMERICAN: 43 mL/min — AB (ref >60)
GFR, EST NON AFRICAN AMERICAN: 37 mL/min — AB (ref >60)
Glucose, Bld: 150 mg/dL — ABNORMAL HIGH (ref 65–99)
POTASSIUM: 4.1 mmol/L (ref 3.5–5.1)
SODIUM: 136 mmol/L (ref 135–145)

## 2014-09-06 LAB — GRAM STAIN

## 2014-09-06 LAB — LACTATE DEHYDROGENASE: LDH: 186 U/L (ref 98–192)

## 2014-09-06 LAB — PROTEIN, BODY FLUID: Total protein, fluid: <3 g/dL

## 2014-09-06 LAB — LACTATE DEHYDROGENASE, PLEURAL OR PERITONEAL FLUID: LD FL: 68 U/L — AB (ref 3–23)

## 2014-09-06 LAB — PROTEIN, TOTAL: TOTAL PROTEIN: 6.2 g/dL — AB (ref 6.5–8.1)

## 2014-09-06 MED ORDER — CARVEDILOL 25 MG PO TABS
25.0000 mg | ORAL_TABLET | Freq: Two times a day (BID) | ORAL | Status: DC
  Administered 2014-09-07 – 2014-09-12 (×11): 25 mg via ORAL
  Filled 2014-09-06 (×12): qty 1

## 2014-09-06 MED ORDER — ENOXAPARIN SODIUM 80 MG/0.8ML ~~LOC~~ SOLN
80.0000 mg | Freq: Two times a day (BID) | SUBCUTANEOUS | Status: DC
  Administered 2014-09-07: 80 mg via SUBCUTANEOUS
  Filled 2014-09-06: qty 0.8

## 2014-09-06 MED ORDER — ISOSORBIDE MONONITRATE ER 60 MG PO TB24
60.0000 mg | ORAL_TABLET | Freq: Every day | ORAL | Status: DC
  Administered 2014-09-06 – 2014-09-08 (×3): 60 mg via ORAL
  Filled 2014-09-06 (×3): qty 1

## 2014-09-06 MED ORDER — INSULIN DETEMIR 100 UNIT/ML ~~LOC~~ SOLN
20.0000 [IU] | Freq: Every day | SUBCUTANEOUS | Status: DC
  Administered 2014-09-06 – 2014-09-08 (×3): 20 [IU] via SUBCUTANEOUS
  Filled 2014-09-06 (×7): qty 0.2

## 2014-09-06 MED ORDER — INSULIN ASPART 100 UNIT/ML ~~LOC~~ SOLN
3.0000 [IU] | Freq: Three times a day (TID) | SUBCUTANEOUS | Status: DC
  Administered 2014-09-07 – 2014-09-12 (×12): 3 [IU] via SUBCUTANEOUS

## 2014-09-06 MED ORDER — LIDOCAINE HCL (PF) 1 % IJ SOLN
INTRAMUSCULAR | Status: AC
  Filled 2014-09-06: qty 10

## 2014-09-06 NOTE — Progress Notes (Signed)
[facility] PROGRESS NOTE  [name] [name] WNU:[id number] DOB: 10-09-1945 DOA: 09/03/2014 PCP: Glenda Chroman., MD  Assessment/Plan: 1. Acute on chronic resp failure -due to Systolic and diastolic CHF with R sided pleural effusion -Diuresed with IV lasix, negative 3.7, due to bump in creatinine will hold lasix today -Has moderate sized R pleural effusion, will hold next dose of lovenox and request US guided Thoracentesis, likely transudative effusion -ECHo 8/26 with normal EF, grade 3 DD and Severe PAH, peak PA pressure of 63mHg-could be due to COPD/OSA and CHF/pleural effusions -Cards following -given significant change in PA pressures, checked VQ scan which is intermediate probability of PE and LE venous dopplers negative -unable to obtain CTA due to mild AKI, if creatinine improves will attempt CTA for definitive diagnosis  2. CAD/CABG -last PCI and stent in 3/15 -continue ASA/plavix/coreg/statin/imdur  3. DM -continue levemir, cut down dose,  meal coverage and SSI  4. Uncontrolled HTN -likely related to volume overloaded state -diuretics -continue amlodipine, hydralazine, coreg, ACE stopped due to AKI  5. COPD/chronic resp failure -on home O2 2l, no wheezing -nebs PRN  6. Dyslipidemia -continue statin  DVt proph: hep SQ  Code Status: DNR Family Communication: none at bedside Disposition Plan: Keep in SDU   Consultants:  Cards  HPI/Subjective: Some dyspnea, no significant change in dyspnea  Objective: Filed Vitals:   09/06/14 1300  BP:   Pulse: 47  Temp:   Resp:     Intake/Output Summary (Last 24 hours) at 09/06/14 1339 Last data filed at 09/06/14 1200  Gross per 24 hour  Intake   1050 ml  Output   2010 ml  Net   -960 ml   Filed Weights   09/04/14 0400 09/05/14 0300 09/06/14 0300  Weight: 89.9 kg (198 lb 3.1 oz) 86.6 kg (190 lb 14.7 oz) 89.1 kg (196 lb 6.9 oz)    Exam:   General:  AAOx3  Cardiovascular: S1S2/RRR  Respiratory: decreased  BS on R  Abdomen: soft, nT, BS present  Musculoskeletal: 1-2 plus edema, scars from prior surgery   Data Reviewed: Basic Metabolic Panel:  Recent Labs Lab 09/04/14 0510 09/05/14 0254 09/06/14 0305  NA 137 136 136  K 4.2 4.4 4.1  CL 100* 100* 100*  CO2 _0 GLUCOSE 266* 151* 150*  BUN 34* 48* 48*  CREATININE 1.17* 1.32* 1.43*  CALCIUM 9.2 8.5* 8.6*   Liver Function Tests:  Recent Labs Lab 09/06/14 1157  PROT 6.2*   No results for input(s): LIPASE, AMYLASE in the last 168 hours. No results for input(s): AMMONIA in the last 168 hours. CBC:  Recent Labs Lab 09/05/14 0254 09/06/14 0305  WBC 7.2 6.7  HGB 9.2* 9.4*  HCT 29.2* 29.8*  MCV 90.7 92.5  PLT 170 164   Cardiac Enzymes:  Recent Labs Lab 09/04/14 09/04/14 0510 09/04/14 1142  TROPONINI 0.03 0.04* 0.03   BNP (last 3 results) No results for input(s): BNP in the last 8760 hours.  ProBNP (last 3 results) No results for input(s): PROBNP in the last 8760 hours.  CBG:  Recent Labs Lab 09/05/14 1402 09/05/14 1608 09/05/14 2105 09/06/14 0739 09/06/14 1118  GLUCAP 123* 188* 145* 75 84    Recent Results (from the past 240 hour(s))  MRSA PCR Screening     Status: Abnormal   Collection Time: 09/03/14 10:59 PM  Result Value Ref Range Status   MRSA by PCR POSITIVE (A) NEGATIVE Final    Comment:  The GeneXpert MRSA Assay (FDA approved for NASAL specimens only), is one component of a comprehensive MRSA colonization surveillance program. It is not intended to diagnose MRSA infection nor to guide or monitor treatment for MRSA infections. RESULT CALLED TO, READ BACK BY AND VERIFIED WITH: H YANBU _0  09/04/14   Gram stain     Status: None   Collection Time: 09/06/14 10:40 AM  Result Value Ref Range Status   Specimen Description FLUID RIGHT PLEURAL  Final   Special Requests BAA 10CCS  Final   Gram Stain   Final    FEW WBC PRESENT,BOTH PMN AND MONONUCLEAR NO ORGANISMS SEEN     Report Status 09/06/2014 FINAL  Final     Studies: Dg Chest 1 View  09/06/2014   CLINICAL DATA:  Status post right-sided thoracentesis.  EXAM: CHEST  1 VIEW  COMPARISON:  1 day prior  FINDINGS: Patient rotated right. Prior median sternotomy. Midline trachea. Mild cardiomegaly. Resolution of right pleural effusion. No apical pneumothorax. Right pleural thickening inferiorly. Cannot exclude trace air inferior and laterally. No left-sided pleural effusion. Interstitial edema is decreased, mild. No lobar consolidation.  IMPRESSION: Status post right-sided thoracentesis with resolution of right pleural effusion. No apical pneumothorax. Cannot exclude trace inferior lateral right pleural air. Recommend attention on follow-up.  Improved interstitial edema.   Electronically Signed   By: Abigail Miyamoto M.D.   On: 09/06/2014 10:49   Dg Chest 2 View  09/05/2014   CLINICAL DATA:  69 year old female with history of hypoxia. History of pneumonia.  EXAM: CHEST  2 VIEW  COMPARISON:  Chest x-ray 09/04/2014.  FINDINGS: There is cephalization of the pulmonary vasculature and slight indistinctness of the interstitial markings suggestive of mild pulmonary edema. Moderate right pleural effusion slightly decreased compared to the prior exam. Opacity in the right lower lobe and may reflect atelectasis and/or airspace consolidation. Mild cardiomegaly. Upper mediastinal contours are within normal limits. Atherosclerosis in the thoracic aorta. Status post median sternotomy for CABG. Surgical clips project over the right upper quadrant of the abdomen, compatible with prior cholecystectomy.  IMPRESSION: 1. The appearance the chest suggests congestive heart failure. 2. In addition, there is a moderate-sized chronic right pleural effusion with probable passive atelectasis in the right lung base (underlying airspace consolidation is not excluded, but is not strongly favored at this time). 3. Atherosclerosis.   Electronically Signed   By:  Vinnie Langton M.D.   On: 09/05/2014 14:11   Nm Pulmonary Perf And Vent  09/05/2014   CLINICAL DATA:  Hypoxia, chest pain, episode of vomiting, history benign essential hypertension, carotid artery disease, coronary artery disease, ischemic cardiomyopathy, MI, type II diabetes mellitus, CHF, former smoker  EXAM: NUCLEAR MEDICINE VENTILATION - PERFUSION LUNG SCAN  TECHNIQUE: Ventilation images were obtained in multiple projections using inhaled aerosol Tc-69mDTPA. Perfusion images were obtained in multiple projections after intravenous injection of Tc-914mAA.  RADIOPHARMACEUTICALS:  39.5 mCi Technetium-9942mPA aerosol inhalation and 5.4 mCi of Technetium-61m73m IV  COMPARISON:  None  Correlation: Chest radiograph 09/05/2014  FINDINGS: Ventilation: Absent ventilation RIGHT lower lobe. Diminished ventilation RIGHT middle lobe. Minimal diminished ventilation in lingula. Swallowed aerosol within stomach.  Perfusion: Matching diminished perfusion in RIGHT middle lobe. Perfusion is present in a portion of posterior aspect of the RIGHT lower lobe with absent perfusion in the remaining RIGHT lower lobe. Tiny subsegmental perfusion defect in lingula, matching.  Chest radiograph: Enlargement of cardiac silhouette with pulmonary vascular congestion, mild pulmonary edema, and moderate to large RIGHT  pleural effusion with basilar atelectasis.  IMPRESSION: Significant ventilatory, perfusion and radiographic abnormalities involving the RIGHT lower lobe, though perfusion is slightly better than ventilation.  Additional matching ventilatory, perfusion, and radiographic abnormalities in RIGHT middle lobe.  Findings represent an intermediate probability for pulmonary embolism.   Electronically Signed   By: Lavonia Dana M.D.   On: 09/05/2014 13:55   US Thoracentesis Asp Pleural Space W/img Guide  09/06/2014   CLINICAL DATA:  CHF, right pleural effusion, request for right thoracentesis  EXAM: ULTRASOUND GUIDED RIGHT  THORACENTESIS  COMPARISON:  None.  PROCEDURE: An ultrasound guided thoracentesis was thoroughly discussed with the patient and questions answered. The benefits, risks, alternatives and complications were also discussed. The patient understands and wishes to proceed with the procedure. Written consent was obtained.  Ultrasound was performed to localize and mark an adequate pocket of fluid in the right chest. The area was then prepped and draped in the normal sterile fashion. 1% Lidocaine was used for local anesthesia. Under ultrasound guidance a Safe T Centesis catheter was introduced. Thoracentesis was performed. The catheter was removed and a dressing applied.  COMPLICATIONS: None immediate.  FINDINGS: A total of approximately 1500 mls of clear bloody fluid was removed. A fluid sample wassent for laboratory analysis.  IMPRESSION: Successful ultrasound guided right thoracentesis yielding 1500 mls of pleural fluid.  Read by:  Gareth Eagle, PA-C   Electronically Signed   By: Jerilynn Mages.  Shick M.D.   On: 09/06/2014 10:42    Scheduled Meds: . amLODipine  10 mg Oral Daily  . antiseptic oral rinse  7 mL Mouth Rinse BID  . atorvastatin  40 mg Oral q1800  . carvedilol  25 mg Oral BID WC  . Chlorhexidine Gluconate Cloth  6 each Topical Q0600  . citalopram  20 mg Oral Daily  . clopidogrel  75 mg Oral Q breakfast  . enoxaparin (LOVENOX) injection  80 mg Subcutaneous Q12H  . gabapentin  300 mg Oral Daily  . hydrALAZINE  25 mg Oral 3 times per day  . insulin aspart  0-9 Units Subcutaneous TID WC  . insulin aspart  4 Units Subcutaneous TID WC  . insulin detemir  22 Units Subcutaneous QHS  . isosorbide mononitrate  60 mg Oral Daily  . lidocaine (PF)      . multivitamin with minerals  1 tablet Oral Daily  . mupirocin ointment   Nasal BID  . potassium chloride  10 mEq Oral Daily  . sodium chloride  3 mL Intravenous Q12H  . Umeclidinium-Vilanterol   Inhalation Daily   Continuous Infusions:  Antibiotics Given (last 72  hours)    None      Principal Problem:   Hypertensive urgency Active Problems:   Hypertension, uncontrolled   Diabetes mellitus type II, uncontrolled   Acute on chronic combined systolic and diastolic heart failure   Pleural effusion, right   Acute on chronic respiratory failure with hypoxia   Pressure ulcer   CHF (congestive heart failure)    Time spent: 81mn    Harrie Cazarez  [facility] Pager [phone] If 7PM-7AM, please contact night-coverage at [email or web address], password [email or web address]8/28/2016, 1:39 PM  LOS: 3 days

## 2014-09-06 NOTE — Progress Notes (Signed)
Patient's HR sustaining in mid-40's, patient asymptomatic.  MD notified, will continue to monitor. Maxwell, Ardeth Sportsman

## 2014-09-06 NOTE — Procedures (Signed)
Successful US guided right thoracentesis. Yielded 1500 mls of clear bloody fluid. Pt tolerated procedure well. No immediate complications.  Specimen was sent for labs. CXR ordered.  Millette Halberstam S Loren Vicens PA-C 09/06/2014 10:23 AM

## 2014-09-06 NOTE — Progress Notes (Signed)
Patient ID: BILLI BRIGHT, female   DOB: November 08, 1945, 69 y.o.   MRN: [id number]    Patient Name: [name] [name] Date of Encounter: 09/06/2014     Principal Problem:   Hypertensive urgency Active Problems:   Hypertension, uncontrolled   Diabetes mellitus type II, uncontrolled   Acute on chronic combined systolic and diastolic heart failure   Pleural effusion, right   Acute on chronic respiratory failure with hypoxia   Pressure ulcer   CHF (congestive heart failure)    SUBJECTIVE  Positive review of systems including chest pressure and sob. Also leg pain.   CURRENT MEDS . amLODipine  10 mg Oral Daily  . antiseptic oral rinse  7 mL Mouth Rinse BID  . atorvastatin  40 mg Oral q1800  . carvedilol  18.75 mg Oral BID WC  . Chlorhexidine Gluconate Cloth  6 each Topical Q0600  . citalopram  20 mg Oral Daily  . clopidogrel  75 mg Oral Q breakfast  . enoxaparin (LOVENOX) injection  80 mg Subcutaneous Q12H  . gabapentin  300 mg Oral Daily  . hydrALAZINE  25 mg Oral 3 times per day  . insulin aspart  0-9 Units Subcutaneous TID WC  . insulin aspart  4 Units Subcutaneous TID WC  . insulin detemir  22 Units Subcutaneous QHS  . isosorbide mononitrate  30 mg Oral Daily  . multivitamin with minerals  1 tablet Oral Daily  . mupirocin ointment   Nasal BID  . potassium chloride  10 mEq Oral Daily  . sodium chloride  3 mL Intravenous Q12H  . Umeclidinium-Vilanterol   Inhalation Daily    OBJECTIVE  Filed Vitals:   09/06/14 0400 09/06/14 0512 09/06/14 0740 09/06/14 0800  BP:  159/46 169/35   Pulse: 50  55 51  Temp:   97.8 F (36.6 C)   TempSrc:   Oral   Resp:      Height:      Weight:      SpO2: 95%  94% 98%    Intake/Output Summary (Last 24 hours) at 09/06/14 0854 Last data filed at 09/06/14 0800  Gross per 24 hour  Intake   1020 ml  Output   2210 ml  Net  -1190 ml   Filed Weights   09/04/14 0400 09/05/14 0300 09/06/14 0300  Weight: 198 lb 3.1 oz (89.9 kg) 190 lb 14.7 oz  (86.6 kg) 196 lb 6.9 oz (89.1 kg)    PHYSICAL EXAM  General: Pleasant, NAD. Neuro: Alert and oriented X 3. Moves all extremities spontaneously. Psych: Normal affect. HEENT:  Normal  Neck: Supple without bruits, JVD 6 cm. Lungs:  Resp regular and unlabored, CTA. Heart: RRR no s3, s4, or murmurs. Abdomen: Soft, non-tender, non-distended, BS + x 4.  Extremities: No clubbing, cyanosis or edema. DP/PT/Radials 2+ and equal bilaterally.  Accessory Clinical Findings  CBC  Recent Labs  09/05/14 0254 09/06/14 0305  WBC 7.2 6.7  HGB 9.2* 9.4*  HCT 29.2* 29.8*  MCV 90.7 92.5  PLT 170 141   Basic Metabolic Panel  Recent Labs  09/05/14 0254 09/06/14 0305  NA 136 136  K 4.4 4.1  CL 100* 100*  CO2 30 29  GLUCOSE 151* 150*  BUN 48* 48*  CREATININE 1.32* 1.43*  CALCIUM 8.5* 8.6*   Liver Function Tests No results for input(s): AST, ALT, ALKPHOS, BILITOT, PROT, ALBUMIN in the last 72 hours. No results for input(s): LIPASE, AMYLASE in the last 72 hours. Cardiac Enzymes  Recent  Labs  09/04/14 09/04/14 0510 09/04/14 1142  TROPONINI 0.03 0.04* 0.03   BNP Invalid input(s): POCBNP D-Dimer No results for input(s): DDIMER in the last 72 hours. Hemoglobin A1C No results for input(s): HGBA1C in the last 72 hours. Fasting Lipid Panel No results for input(s): CHOL, HDL, LDLCALC, TRIG, CHOLHDL, LDLDIRECT in the last 72 hours. Thyroid Function Tests No results for input(s): TSH, T4TOTAL, T3FREE, THYROIDAB in the last 72 hours.  Invalid input(s): FREET3  TELE  NSR   Radiology/Studies  Dg Chest 2 View  09/05/2014   CLINICAL DATA:  69 year old female with history of hypoxia. History of pneumonia.  EXAM: CHEST  2 VIEW  COMPARISON:  Chest x-ray 09/04/2014.  FINDINGS: There is cephalization of the pulmonary vasculature and slight indistinctness of the interstitial markings suggestive of mild pulmonary edema. Moderate right pleural effusion slightly decreased compared to the prior  exam. Opacity in the right lower lobe and may reflect atelectasis and/or airspace consolidation. Mild cardiomegaly. Upper mediastinal contours are within normal limits. Atherosclerosis in the thoracic aorta. Status post median sternotomy for CABG. Surgical clips project over the right upper quadrant of the abdomen, compatible with prior cholecystectomy.  IMPRESSION: 1. The appearance the chest suggests congestive heart failure. 2. In addition, there is a moderate-sized chronic right pleural effusion with probable passive atelectasis in the right lung base (underlying airspace consolidation is not excluded, but is not strongly favored at this time). 3. Atherosclerosis.   Electronically Signed   By: Vinnie Langton M.D.   On: 09/05/2014 14:11   Dg Chest 2 View  09/04/2014   CLINICAL DATA:  Shortness of breath, hypoxia, history of CHF, CABG, and respiratory failure, history of previous heavy tobacco use.  EXAM: CHEST  2 VIEW  COMPARISON:  Portable chest x-ray of September 03, 2014  FINDINGS: There is persistent moderate-sized right pleural effusion. Right basilar atelectasis or pneumonia is suspected. The interstitial markings of both lungs are increased. There is subsegmental atelectasis in the lingula. No left pleural effusion is observed. The cardiac silhouette is enlarged. There are post CABG changes. The bony thorax exhibits no acute abnormality.  IMPRESSION: CHF with mild pulmonary interstitial edema and moderate-sized right-sided pleural effusion. Underlying atelectasis or pneumonia in the lower right lung is suspected. Continued follow-up radiographs are recommended to assure positive response to therapy.   Electronically Signed   By: David  Martinique M.D.   On: 09/04/2014 08:38   Nm Pulmonary Perf And Vent  09/05/2014   CLINICAL DATA:  Hypoxia, chest pain, episode of vomiting, history benign essential hypertension, carotid artery disease, coronary artery disease, ischemic cardiomyopathy, MI, type II diabetes  mellitus, CHF, former smoker  EXAM: NUCLEAR MEDICINE VENTILATION - PERFUSION LUNG SCAN  TECHNIQUE: Ventilation images were obtained in multiple projections using inhaled aerosol Tc-77mDTPA. Perfusion images were obtained in multiple projections after intravenous injection of Tc-965mAA.  RADIOPHARMACEUTICALS:  39.5 mCi Technetium-991mPA aerosol inhalation and 5.4 mCi of Technetium-48m62m IV  COMPARISON:  None  Correlation: Chest radiograph 09/05/2014  FINDINGS: Ventilation: Absent ventilation RIGHT lower lobe. Diminished ventilation RIGHT middle lobe. Minimal diminished ventilation in lingula. Swallowed aerosol within stomach.  Perfusion: Matching diminished perfusion in RIGHT middle lobe. Perfusion is present in a portion of posterior aspect of the RIGHT lower lobe with absent perfusion in the remaining RIGHT lower lobe. Tiny subsegmental perfusion defect in lingula, matching.  Chest radiograph: Enlargement of cardiac silhouette with pulmonary vascular congestion, mild pulmonary edema, and moderate to large RIGHT pleural effusion with basilar  atelectasis.  IMPRESSION: Significant ventilatory, perfusion and radiographic abnormalities involving the RIGHT lower lobe, though perfusion is slightly better than ventilation.  Additional matching ventilatory, perfusion, and radiographic abnormalities in RIGHT middle lobe.  Findings represent an intermediate probability for pulmonary embolism.   Electronically Signed   By: Lavonia Dana M.D.   On: 09/05/2014 13:55    ASSESSMENT AND PLAN  1. Acute on chronic systolic/diastolic heart failure - note plans for thoracentesis. Continue other meds. 2. Uncontrolled HTN - ordered additional coreg and Imdur 3. Intermediate prob VQ scan for pulmonay embolism - consider ultrasound of the lower extremities  Gregg Taylor,M.D.  09/06/2014 8:54 AM

## 2014-09-07 ENCOUNTER — Inpatient Hospital Stay (HOSPITAL_COMMUNITY): Payer: Medicare Other

## 2014-09-07 DIAGNOSIS — I5033 Acute on chronic diastolic (congestive) heart failure: Secondary | ICD-10-CM | POA: Insufficient documentation

## 2014-09-07 LAB — GLUCOSE, CAPILLARY
GLUCOSE-CAPILLARY: 138 mg/dL — AB (ref 65–99)
GLUCOSE-CAPILLARY: 118 mg/dL — AB (ref 65–99)
GLUCOSE-CAPILLARY: 232 mg/dL — AB (ref 65–99)
Glucose-Capillary: 54 mg/dL — ABNORMAL LOW (ref 65–99)
Glucose-Capillary: 94 mg/dL (ref 65–99)
Glucose-Capillary: 69 mg/dL (ref 65–99)

## 2014-09-07 LAB — BASIC METABOLIC PANEL
Anion gap: 4 — ABNORMAL LOW (ref 5–15)
BUN: 38 mg/dL — AB (ref 6–20)
CALCIUM: 8.5 mg/dL — AB (ref 8.9–10.3)
CO2: 30 mmol/L (ref 22–32)
CREATININE: 1.18 mg/dL — AB (ref 0.44–1.00)
Chloride: 103 mmol/L (ref 101–111)
GFR calc non Af Amer: 47 mL/min — ABNORMAL LOW (ref >60)
GFR, EST AFRICAN AMERICAN: 54 mL/min — AB (ref >60)
GLUCOSE: 94 mg/dL (ref 65–99)
Potassium: 3.8 mmol/L (ref 3.5–5.1)
Sodium: 137 mmol/L (ref 135–145)

## 2014-09-07 LAB — CBC
HEMATOCRIT: 29.9 % — AB (ref 36.0–46.0)
Hemoglobin: 9.5 g/dL — ABNORMAL LOW (ref 12.0–15.0)
MCH: 28.7 pg (ref 26.0–34.0)
MCHC: 31.8 g/dL (ref 30.0–36.0)
MCV: 90.3 fL (ref 78.0–100.0)
Platelets: 178 10*3/uL (ref 150–400)
RBC: 3.31 MIL/uL — ABNORMAL LOW (ref 3.87–5.11)
RDW: 15.4 % (ref 11.5–15.5)
WBC: 6.7 10*3/uL (ref 4.0–10.5)

## 2014-09-07 MED ORDER — HYDRALAZINE HCL 25 MG PO TABS
37.5000 mg | ORAL_TABLET | Freq: Three times a day (TID) | ORAL | Status: DC
  Administered 2014-09-07 – 2014-09-08 (×3): 37.5 mg via ORAL
  Filled 2014-09-07 (×3): qty 2

## 2014-09-07 MED ORDER — IOHEXOL 350 MG/ML SOLN
80.0000 mL | Freq: Once | INTRAVENOUS | Status: AC | PRN
  Administered 2014-09-07: 80 mL via INTRAVENOUS

## 2014-09-07 MED ORDER — HYDRALAZINE HCL 20 MG/ML IJ SOLN
10.0000 mg | Freq: Four times a day (QID) | INTRAMUSCULAR | Status: DC | PRN
  Administered 2014-09-08: 10 mg via INTRAVENOUS
  Filled 2014-09-07: qty 1

## 2014-09-07 MED ORDER — ENOXAPARIN SODIUM 40 MG/0.4ML ~~LOC~~ SOLN
40.0000 mg | SUBCUTANEOUS | Status: DC
  Administered 2014-09-08 – 2014-09-12 (×5): 40 mg via SUBCUTANEOUS
  Filled 2014-09-07 (×5): qty 0.4

## 2014-09-07 NOTE — Progress Notes (Signed)
Inpatient Diabetes Program Recommendations  AACE/ADA: New Consensus Statement on Inpatient Glycemic Control (2013)  Target Ranges:  Prepandial:   less than 140 mg/dL      Peak postprandial:   less than 180 mg/dL (1-2 hours)      Critically ill patients:  140 - 180 mg/dL  Results for JACOB, CHAMBLEE (MRN [id number]) as of 09/07/2014 10:32  Ref. Range 09/07/2014 05:35 09/07/2014 05:59 09/07/2014 07:54  Glucose-Capillary Latest Ref Range: 65-99 mg/dL 54 (L) 94 69   Inpatient Diabetes Program Recommendations Insulin - Basal: Decrease Levemir to 16 units  Thank you  Raoul Pitch BSN, RN,CDE Inpatient Diabetes Coordinator [phone] (team pager)

## 2014-09-07 NOTE — Care Management Important Message (Signed)
Important Message  Patient Details  Name: [name] [name] MRN: [id number] Date of Birth: 01/24/1945   Medicare Important Message Given:  Yes-second notification given    Lacretia Leigh, RN 09/07/2014, 11:47 AM

## 2014-09-07 NOTE — Progress Notes (Addendum)
[facility] PROGRESS NOTE  Nishita Isaacks Lombardozzi VKF:[id number] DOB: [birth year] DOA: 09/03/2014 PCP: Glenda Chroman., MD  Assessment/Plan: 1. Acute on chronic resp failure -due to Systolic and diastolic CHF with R sided pleural effusion, ? Possible PE -Diuresed with IV lasix, negative 4.8L due to bump in creatinine will held lasix 8/28 -Moderate sized R pleural effusion, s/p US guided Thoracentesis 1500cc, transudative effusion based on protein and LDH, blood tinged, possibly traumatic tap, await cytology -ECHo 8/26 with normal EF, grade 3 DD and Severe PAH, peak PA pressure of 44mHg-could be due to COPD/OSA and CHF/pleural effusions -Cards following -due to significant change in PA pressures, VQ scan obtained which was is intermediate probability of PE and LE venous dopplers negative -since creatinine improved and continues to have pleuritic chest pain since yesterday am will obtain CTA r/o PE -continue full dose lovenox  2. CAD/CABG -last PCI and stent in 3/15 -continue ASA/plavix/coreg/statin/imdur  3. DM -continue levemir, cut down dose,  meal coverage and SSI  4. Uncontrolled HTN -improving -diuretics -continue amlodipine, hydralazine, coreg, ACE stopped due to AKI  5. COPD/chronic resp failure -on home O2 2l, no wheezing -nebs PRN  6. Dyslipidemia -continue statin  DVt proph: hep SQ  Code Status: DNR Family Communication: none at bedside Disposition Plan: Keep in SDU   Consultants:  Cards  HPI/Subjective: Still with pleuritic chest pain  Objective: Filed Vitals:   09/07/14 0400  BP: 171/39  Pulse: 55  Temp:   Resp:     Intake/Output Summary (Last 24 hours) at 09/07/14 0751 Last data filed at 09/07/14 0400  Gross per 24 hour  Intake    720 ml  Output   1600 ml  Net   -880 ml   Filed Weights   09/05/14 0300 09/06/14 0300 09/07/14 0500  Weight: 86.6 kg (190 lb 14.7 oz) 89.1 kg (196 lb 6.9 oz) 86.7 kg (191 lb 2.2 oz)    Exam:   General:   AAOx3  Cardiovascular: S1S2/RRR  Respiratory: decreased BS both bases  Abdomen: soft, nT, BS present  Musculoskeletal: 1-2 plus edema, scars from prior surgery   Data Reviewed: Basic Metabolic Panel:  Recent Labs Lab 09/04/14 0510 09/05/14 0254 09/06/14 0305 09/07/14 0230  NA 137 136 136 137  K 4.2 4.4 4.1 3.8  CL 100* 100* 100* 103  CO2 _0 GLUCOSE 266* 151* 150* 94  BUN 34* 48* 48* 38*  CREATININE 1.17* 1.32* 1.43* 1.18*  CALCIUM 9.2 8.5* 8.6* 8.5*   Liver Function Tests:  Recent Labs Lab 09/06/14 1157  PROT 6.2*   No results for input(s): LIPASE, AMYLASE in the last 168 hours. No results for input(s): AMMONIA in the last 168 hours. CBC:  Recent Labs Lab 09/05/14 0254 09/06/14 0305 09/07/14 0230  WBC 7.2 6.7 6.7  HGB 9.2* 9.4* 9.5*  HCT 29.2* 29.8* 29.9*  MCV 90.7 92.5 90.3  PLT 170 164 178   Cardiac Enzymes:  Recent Labs Lab 09/04/14 09/04/14 0510 09/04/14 1142  TROPONINI 0.03 0.04* 0.03   BNP (last 3 results) No results for input(s): BNP in the last 8760 hours.  ProBNP (last 3 results) No results for input(s): PROBNP in the last 8760 hours.  CBG:  Recent Labs Lab 09/06/14 1610 09/06/14 1658 09/06/14 2115 09/07/14 0535 09/07/14 0559  GLUCAP 65 86 145* 54* 94    Recent Results (from the past 240 hour(s))  MRSA PCR Screening     Status: Abnormal   Collection Time: 09/03/14 10:59  PM  Result Value Ref Range Status   MRSA by PCR POSITIVE (A) NEGATIVE Final    Comment:        The GeneXpert MRSA Assay (FDA approved for NASAL specimens only), is one component of a comprehensive MRSA colonization surveillance program. It is not intended to diagnose MRSA infection nor to guide or monitor treatment for MRSA infections. RESULT CALLED TO, READ BACK BY AND VERIFIED WITH: H YANBU _0  09/04/14   Gram stain     Status: None   Collection Time: 09/06/14 10:40 AM  Result Value Ref Range Status   Specimen Description FLUID  RIGHT PLEURAL  Final   Special Requests BAA 10CCS  Final   Gram Stain   Final    FEW WBC PRESENT,BOTH PMN AND MONONUCLEAR NO ORGANISMS SEEN    Report Status 09/06/2014 FINAL  Final     Studies: Dg Chest 1 View  09/06/2014   CLINICAL DATA:  Status post right-sided thoracentesis.  EXAM: CHEST  1 VIEW  COMPARISON:  1 day prior  FINDINGS: Patient rotated right. Prior median sternotomy. Midline trachea. Mild cardiomegaly. Resolution of right pleural effusion. No apical pneumothorax. Right pleural thickening inferiorly. Cannot exclude trace air inferior and laterally. No left-sided pleural effusion. Interstitial edema is decreased, mild. No lobar consolidation.  IMPRESSION: Status post right-sided thoracentesis with resolution of right pleural effusion. No apical pneumothorax. Cannot exclude trace inferior lateral right pleural air. Recommend attention on follow-up.  Improved interstitial edema.   Electronically Signed   By: Abigail Miyamoto M.D.   On: 09/06/2014 10:49   Dg Chest 2 View  09/05/2014   CLINICAL DATA:  69 year old female with history of hypoxia. History of pneumonia.  EXAM: CHEST  2 VIEW  COMPARISON:  Chest x-ray 09/04/2014.  FINDINGS: There is cephalization of the pulmonary vasculature and slight indistinctness of the interstitial markings suggestive of mild pulmonary edema. Moderate right pleural effusion slightly decreased compared to the prior exam. Opacity in the right lower lobe and may reflect atelectasis and/or airspace consolidation. Mild cardiomegaly. Upper mediastinal contours are within normal limits. Atherosclerosis in the thoracic aorta. Status post median sternotomy for CABG. Surgical clips project over the right upper quadrant of the abdomen, compatible with prior cholecystectomy.  IMPRESSION: 1. The appearance the chest suggests congestive heart failure. 2. In addition, there is a moderate-sized chronic right pleural effusion with probable passive atelectasis in the right lung base  (underlying airspace consolidation is not excluded, but is not strongly favored at this time). 3. Atherosclerosis.   Electronically Signed   By: Vinnie Langton M.D.   On: 09/05/2014 14:11   Nm Pulmonary Perf And Vent  09/05/2014   CLINICAL DATA:  Hypoxia, chest pain, episode of vomiting, history benign essential hypertension, carotid artery disease, coronary artery disease, ischemic cardiomyopathy, MI, type II diabetes mellitus, CHF, former smoker  EXAM: NUCLEAR MEDICINE VENTILATION - PERFUSION LUNG SCAN  TECHNIQUE: Ventilation images were obtained in multiple projections using inhaled aerosol Tc-52mDTPA. Perfusion images were obtained in multiple projections after intravenous injection of Tc-987mAA.  RADIOPHARMACEUTICALS:  39.5 mCi Technetium-9918mPA aerosol inhalation and 5.4 mCi of Technetium-32m81m IV  COMPARISON:  None  Correlation: Chest radiograph 09/05/2014  FINDINGS: Ventilation: Absent ventilation RIGHT lower lobe. Diminished ventilation RIGHT middle lobe. Minimal diminished ventilation in lingula. Swallowed aerosol within stomach.  Perfusion: Matching diminished perfusion in RIGHT middle lobe. Perfusion is present in a portion of posterior aspect of the RIGHT lower lobe with absent perfusion in the remaining RIGHT lower  lobe. Tiny subsegmental perfusion defect in lingula, matching.  Chest radiograph: Enlargement of cardiac silhouette with pulmonary vascular congestion, mild pulmonary edema, and moderate to large RIGHT pleural effusion with basilar atelectasis.  IMPRESSION: Significant ventilatory, perfusion and radiographic abnormalities involving the RIGHT lower lobe, though perfusion is slightly better than ventilation.  Additional matching ventilatory, perfusion, and radiographic abnormalities in RIGHT middle lobe.  Findings represent an intermediate probability for pulmonary embolism.   Electronically Signed   By: Lavonia Dana M.D.   On: 09/05/2014 13:55   US Thoracentesis Asp Pleural Space  W/img Guide  09/06/2014   CLINICAL DATA:  CHF, right pleural effusion, request for right thoracentesis  EXAM: ULTRASOUND GUIDED RIGHT THORACENTESIS  COMPARISON:  None.  PROCEDURE: An ultrasound guided thoracentesis was thoroughly discussed with the patient and questions answered. The benefits, risks, alternatives and complications were also discussed. The patient understands and wishes to proceed with the procedure. Written consent was obtained.  Ultrasound was performed to localize and mark an adequate pocket of fluid in the right chest. The area was then prepped and draped in the normal sterile fashion. 1% Lidocaine was used for local anesthesia. Under ultrasound guidance a Safe T Centesis catheter was introduced. Thoracentesis was performed. The catheter was removed and a dressing applied.  COMPLICATIONS: None immediate.  FINDINGS: A total of approximately 1500 mls of clear bloody fluid was removed. A fluid sample wassent for laboratory analysis.  IMPRESSION: Successful ultrasound guided right thoracentesis yielding 1500 mls of pleural fluid.  Read by:  Gareth Eagle, PA-C   Electronically Signed   By: Jerilynn Mages.  Shick M.D.   On: 09/06/2014 10:42    Scheduled Meds: . amLODipine  10 mg Oral Daily  . antiseptic oral rinse  7 mL Mouth Rinse BID  . atorvastatin  40 mg Oral q1800  . carvedilol  25 mg Oral BID WC  . Chlorhexidine Gluconate Cloth  6 each Topical Q0600  . citalopram  20 mg Oral Daily  . clopidogrel  75 mg Oral Q breakfast  . enoxaparin (LOVENOX) injection  80 mg Subcutaneous Q12H  . gabapentin  300 mg Oral Daily  . hydrALAZINE  25 mg Oral 3 times per day  . insulin aspart  0-9 Units Subcutaneous TID WC  . insulin aspart  3 Units Subcutaneous TID WC  . insulin detemir  20 Units Subcutaneous QHS  . isosorbide mononitrate  60 mg Oral Daily  . multivitamin with minerals  1 tablet Oral Daily  . mupirocin ointment   Nasal BID  . potassium chloride  10 mEq Oral Daily  . sodium chloride  3 mL  Intravenous Q12H  . Umeclidinium-Vilanterol   Inhalation Daily   Continuous Infusions:  Antibiotics Given (last 72 hours)    None      Principal Problem:   Hypertensive urgency Active Problems:   Hypertension, uncontrolled   Diabetes mellitus type II, uncontrolled   Acute on chronic combined systolic and diastolic heart failure   Pleural effusion, right   Acute on chronic respiratory failure with hypoxia   Pressure ulcer   CHF (congestive heart failure)    Time spent: 46mn    Linah Klapper  [facility] Pager [phone] If 7PM-7AM, please contact night-coverage at [email or web address], password [facility]8/29/2016, 7:51 AM  LOS: 4 days

## 2014-09-07 NOTE — Progress Notes (Signed)
Subjective:  Breathing somewhat better  Objective:   Vital Signs : Filed Vitals:   09/07/14 0400 09/07/14 0500 09/07/14 0755 09/07/14 1010  BP: 171/39  184/43 167/35  Pulse: 55  54 47  Temp:   98.4 F (36.9 C)   TempSrc:   Oral   Resp:   18   Height:      Weight:  191 lb 2.2 oz (86.7 kg)    SpO2: 94%  96% 91%    Intake/Output from previous day:  Intake/Output Summary (Last 24 hours) at 09/07/14 1101 Last data filed at 09/07/14 1000  Gross per 24 hour  Intake    600 ml  Output   1250 ml  Net   -650 ml    I/O since admission: -4675  Wt Readings from Last 3 Encounters:  09/07/14 191 lb 2.2 oz (86.7 kg)  05/13/14 198 lb (89.812 kg)  02/25/14 197 lb (89.359 kg)    Medications: . amLODipine  10 mg Oral Daily  . antiseptic oral rinse  7 mL Mouth Rinse BID  . atorvastatin  40 mg Oral q1800  . carvedilol  25 mg Oral BID WC  . Chlorhexidine Gluconate Cloth  6 each Topical Q0600  . citalopram  20 mg Oral Daily  . clopidogrel  75 mg Oral Q breakfast  . enoxaparin (LOVENOX) injection  80 mg Subcutaneous Q12H  . gabapentin  300 mg Oral Daily  . hydrALAZINE  25 mg Oral 3 times per day  . insulin aspart  0-9 Units Subcutaneous TID WC  . insulin aspart  3 Units Subcutaneous TID WC  . insulin detemir  20 Units Subcutaneous QHS  . isosorbide mononitrate  60 mg Oral Daily  . multivitamin with minerals  1 tablet Oral Daily  . mupirocin ointment   Nasal BID  . potassium chloride  10 mEq Oral Daily  . sodium chloride  3 mL Intravenous Q12H  . Umeclidinium-Vilanterol   Inhalation Daily       Physical Exam:   General appearance: alert, cooperative and mild discomfort with deep breathing Neck: no adenopathy, no carotid bruit and supple, symmetrical, trachea midline Lungs: decreased BS R lower lung field; no wheezing Heart: SB 1/6 sem , no gallop Abdomen: soft, non-tender; bowel sounds normal; no masses,  no organomegaly Extremities: extremities normal, atraumatic, no  cyanosis or edema Pulses: 2+ and symmetric Skin: small hemorrhagic lesion left forearm Neurologic: Grossly normal   Rate: 52  Rhythm: sinus bradycardia  ECG (independently read by me): Will order; do not see one from this admission in epic  Lab Results:   Recent Labs  09/05/14 0254 09/06/14 0305 09/07/14 0230  NA 136 136 137  K 4.4 4.1 3.8  CL 100* 100* 103  CO2 _0 GLUCOSE 151* 150* 94  BUN 48* 48* 38*  CREATININE 1.32* 1.43* 1.18*  CALCIUM 8.5* 8.6* 8.5*    Hepatic Function Latest Ref Rng 09/06/2014 03/19/2013 11/22/2010  Total Protein 6.5 - 8.1 g/dL 6.2(L) 6.4 7.4  Albumin 3.5 - 5.2 g/dL - 2.6(L) 3.6  AST 0 - 37 U/L - 26 18  ALT 0 - 35 U/L - 26 15  Alk Phosphatase 39 - 117 U/L - 98 85  Total Bilirubin 0.3 - 1.2 mg/dL - 0.3 0.3     Recent Labs  09/05/14 0254 09/06/14 0305 09/07/14 0230  WBC 7.2 6.7 6.7  HGB 9.2* 9.4* 9.5*  HCT 29.2* 29.8* 29.9*  MCV 90.7 92.5 90.3  PLT 170  164 178     Recent Labs  09/04/14 1142  TROPONINI 0.03    Lab Results  Component Value Date   TSH 3.001 03/18/2013   No results for input(s): HGBA1C in the last 72 hours.   Recent Labs  09/06/14 1157  PROT 6.2*   No results for input(s): INR in the last 72 hours. BNP (last 3 results) No results for input(s): BNP in the last 8760 hours.  ProBNP (last 3 results) No results for input(s): PROBNP in the last 8760 hours.   Lipid Panel     Component Value Date/Time   CHOL 114 10/13/2010 0401   TRIG 96 10/13/2010 0401   HDL 31* 10/13/2010 0401   CHOLHDL 3.7 10/13/2010 0401   VLDL 19 10/13/2010 0401   LDLCALC 64 10/13/2010 0401         Imaging:  Dg Chest 1 View  09/06/2014   CLINICAL DATA:  Status post right-sided thoracentesis.  EXAM: CHEST  1 VIEW  COMPARISON:  1 day prior  FINDINGS: Patient rotated right. Prior median sternotomy. Midline trachea. Mild cardiomegaly. Resolution of right pleural effusion. No apical pneumothorax. Right pleural thickening  inferiorly. Cannot exclude trace air inferior and laterally. No left-sided pleural effusion. Interstitial edema is decreased, mild. No lobar consolidation.  IMPRESSION: Status post right-sided thoracentesis with resolution of right pleural effusion. No apical pneumothorax. Cannot exclude trace inferior lateral right pleural air. Recommend attention on follow-up.  Improved interstitial edema.   Electronically Signed   By: Abigail Miyamoto M.D.   On: 09/06/2014 10:49   Dg Chest 2 View  09/05/2014   CLINICAL DATA:  69 year old female with history of hypoxia. History of pneumonia.  EXAM: CHEST  2 VIEW  COMPARISON:  Chest x-ray 09/04/2014.  FINDINGS: There is cephalization of the pulmonary vasculature and slight indistinctness of the interstitial markings suggestive of mild pulmonary edema. Moderate right pleural effusion slightly decreased compared to the prior exam. Opacity in the right lower lobe and may reflect atelectasis and/or airspace consolidation. Mild cardiomegaly. Upper mediastinal contours are within normal limits. Atherosclerosis in the thoracic aorta. Status post median sternotomy for CABG. Surgical clips project over the right upper quadrant of the abdomen, compatible with prior cholecystectomy.  IMPRESSION: 1. The appearance the chest suggests congestive heart failure. 2. In addition, there is a moderate-sized chronic right pleural effusion with probable passive atelectasis in the right lung base (underlying airspace consolidation is not excluded, but is not strongly favored at this time). 3. Atherosclerosis.   Electronically Signed   By: Vinnie Langton M.D.   On: 09/05/2014 14:11   Ct Angio Chest Pe W/cm &/or Wo Cm  09/07/2014   CLINICAL DATA:  Shortness of breath for several days. New chest pain with inspiration. Status post thoracentesis 09/06/2014. The patient's pain is to the right of midline. No known injury. Subsequent encounter.  EXAM: CT ANGIOGRAPHY CHEST WITH CONTRAST  TECHNIQUE:  Multidetector CT imaging of the chest was performed using the standard protocol during bolus administration of intravenous contrast. Multiplanar CT image reconstructions and MIPs were obtained to evaluate the vascular anatomy.  CONTRAST:  80 mL OMNIPAQUE IOHEXOL 350 MG/ML SOLN  COMPARISON:  CT chest 12/22/2013 and 07/28/2013. Single view of the chest 09/05/2013.  FINDINGS: No pulmonary embolus is identified. There is cardiomegaly. A very small left pleural effusion is noted. A moderate to moderately large right pleural effusion is seen. There is no pericardial effusion. As on prior examinations, there are some mildly prominent lymph nodes in the  mediastinum and hila including a 1.4 cm pretracheal node on image 41 and 1.6 cm right hilar lymph node on image 50. No new or enlarging lymph nodes are identified in the axilla, hila or mediastinum. The patient is status post CABG.  Scattered ground-glass attenuation is present with interlobular septal thickening. There is no pneumothorax. Mild compressive atelectasis is more notable on the right. No consolidative process, nodule or mass is identified.  Visualized upper abdomen shows small amount of perihepatic ascites. Imaged intra-abdominal contents are otherwise unremarkable. No lytic or sclerotic bony lesion is seen.  Review of the MIP images confirms the above findings.  IMPRESSION: Negative for pulmonary embolus.  Findings most consistent with congestive heart failure with an associated moderate to moderately large right pleural effusion.  Very small left pleural effusion after thoracentesis. Negative for pneumothorax.  Small volume of perihepatic ascites.  Mildly enlarged mediastinal and hilar lymph nodes are chronic and likely reactive.   Electronically Signed   By: Inge Rise M.D.   On: 09/07/2014 09:55   Nm Pulmonary Perf And Vent  09/05/2014   CLINICAL DATA:  Hypoxia, chest pain, episode of vomiting, history benign essential hypertension, carotid artery  disease, coronary artery disease, ischemic cardiomyopathy, MI, type II diabetes mellitus, CHF, former smoker  EXAM: NUCLEAR MEDICINE VENTILATION - PERFUSION LUNG SCAN  TECHNIQUE: Ventilation images were obtained in multiple projections using inhaled aerosol Tc-3mDTPA. Perfusion images were obtained in multiple projections after intravenous injection of Tc-967mAA.  RADIOPHARMACEUTICALS:  39.5 mCi Technetium-9963mPA aerosol inhalation and 5.4 mCi of Technetium-54m59m IV  COMPARISON:  None  Correlation: Chest radiograph 09/05/2014  FINDINGS: Ventilation: Absent ventilation RIGHT lower lobe. Diminished ventilation RIGHT middle lobe. Minimal diminished ventilation in lingula. Swallowed aerosol within stomach.  Perfusion: Matching diminished perfusion in RIGHT middle lobe. Perfusion is present in a portion of posterior aspect of the RIGHT lower lobe with absent perfusion in the remaining RIGHT lower lobe. Tiny subsegmental perfusion defect in lingula, matching.  Chest radiograph: Enlargement of cardiac silhouette with pulmonary vascular congestion, mild pulmonary edema, and moderate to large RIGHT pleural effusion with basilar atelectasis.  IMPRESSION: Significant ventilatory, perfusion and radiographic abnormalities involving the RIGHT lower lobe, though perfusion is slightly better than ventilation.  Additional matching ventilatory, perfusion, and radiographic abnormalities in RIGHT middle lobe.  Findings represent an intermediate probability for pulmonary embolism.   Electronically Signed   By: MarkLavonia Dana.   On: 09/05/2014 13:55   Us TKorearacentesis Asp Pleural Space W/img Guide  09/06/2014   CLINICAL DATA:  CHF, right pleural effusion, request for right thoracentesis  EXAM: ULTRASOUND GUIDED RIGHT THORACENTESIS  COMPARISON:  None.  PROCEDURE: An ultrasound guided thoracentesis was thoroughly discussed with the patient and questions answered. The benefits, risks, alternatives and complications were also  discussed. The patient understands and wishes to proceed with the procedure. Written consent was obtained.  Ultrasound was performed to localize and mark an adequate pocket of fluid in the right chest. The area was then prepped and draped in the normal sterile fashion. 1% Lidocaine was used for local anesthesia. Under ultrasound guidance a Safe T Centesis catheter was introduced. Thoracentesis was performed. The catheter was removed and a dressing applied.  COMPLICATIONS: None immediate.  FINDINGS: A total of approximately 1500 mls of clear bloody fluid was removed. A fluid sample wassent for laboratory analysis.  IMPRESSION: Successful ultrasound guided right thoracentesis yielding 1500 mls of pleural fluid.  Read by:  WendGareth Eagle-C   Electronically Signed  By: Eugenie Filler M.D.   On: 09/06/2014 10:42      Assessment/Plan:   Principal Problem:   Hypertensive urgency Active Problems:   Hypertension, uncontrolled   Diabetes mellitus type II, uncontrolled   Acute on chronic combined systolic and diastolic heart failure   Pleural effusion, right   Acute on chronic respiratory failure with hypoxia   Pressure ulcer   CHF (congestive heart failure)  1. Hypertensive crisis:  BP improved on amlodipine 10 mg, coreg 25 mg bid, hydralzine 25 mg tid, and nitrates.  Will titrate hydralazine to 37.5 mg tid. 2. Chest Pain/CAD: Negative troponin during this admission.  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 Placed on DAPT with ASA/plavix 3. Acute diastolic dysfunction; grade 3;  EF 55-60%;  4. Pleural effusion: s/p 1.5 liter thoracentesis yesterday 5. Intermediate VQ scan with negative for PE CT scan 6. Renal insufficiency; better today 7. Anticoagulation; will change  enoxaparon to VTE dose 8. Anemia   [name] Sine, MD, [facility] 09/07/2014, 11:01 AM

## 2014-09-08 ENCOUNTER — Ambulatory Visit: Payer: Medicare Other | Admitting: Cardiology

## 2014-09-08 ENCOUNTER — Inpatient Hospital Stay (HOSPITAL_COMMUNITY): Payer: Medicare Other

## 2014-09-08 DIAGNOSIS — R079 Chest pain, unspecified: Secondary | ICD-10-CM | POA: Insufficient documentation

## 2014-09-08 LAB — BASIC METABOLIC PANEL
Anion gap: 3 — ABNORMAL LOW (ref 5–15)
BUN: 25 mg/dL — AB (ref 6–20)
CALCIUM: 8.6 mg/dL — AB (ref 8.9–10.3)
CO2: 32 mmol/L (ref 22–32)
Chloride: 104 mmol/L (ref 101–111)
Creatinine, Ser: 1.05 mg/dL — ABNORMAL HIGH (ref 0.44–1.00)
GFR calc Af Amer: >60 mL/min (ref >60)
GFR, EST NON AFRICAN AMERICAN: 54 mL/min — AB (ref >60)
GLUCOSE: 124 mg/dL — AB (ref 65–99)
Potassium: 4.4 mmol/L (ref 3.5–5.1)
Sodium: 139 mmol/L (ref 135–145)

## 2014-09-08 LAB — GLUCOSE, CAPILLARY
GLUCOSE-CAPILLARY: 97 mg/dL (ref 65–99)
GLUCOSE-CAPILLARY: 124 mg/dL — AB (ref 65–99)
GLUCOSE-CAPILLARY: 142 mg/dL — AB (ref 65–99)
Glucose-Capillary: 57 mg/dL — ABNORMAL LOW (ref 65–99)
Glucose-Capillary: 131 mg/dL — ABNORMAL HIGH (ref 65–99)

## 2014-09-08 LAB — PH, BODY FLUID: pH, Body Fluid: 7.8

## 2014-09-08 LAB — CBC
HEMATOCRIT: 30.6 % — AB (ref 36.0–46.0)
Hemoglobin: 9.7 g/dL — ABNORMAL LOW (ref 12.0–15.0)
MCH: 29 pg (ref 26.0–34.0)
MCHC: 31.7 g/dL (ref 30.0–36.0)
MCV: 91.6 fL (ref 78.0–100.0)
PLATELETS: 148 10*3/uL — AB (ref 150–400)
RBC: 3.34 MIL/uL — ABNORMAL LOW (ref 3.87–5.11)
RDW: 15.5 % (ref 11.5–15.5)
WBC: 5.5 10*3/uL (ref 4.0–10.5)

## 2014-09-08 LAB — C DIFFICILE QUICK SCREEN W PCR REFLEX
C Diff antigen: NEGATIVE
C Diff interpretation: NEGATIVE
C Diff toxin: NEGATIVE

## 2014-09-08 MED ORDER — NITROGLYCERIN 0.4 MG SL SUBL
SUBLINGUAL_TABLET | SUBLINGUAL | Status: AC
  Administered 2014-09-08: 0.4 mg
  Filled 2014-09-08: qty 1

## 2014-09-08 MED ORDER — HYDRALAZINE HCL 50 MG PO TABS
50.0000 mg | ORAL_TABLET | Freq: Three times a day (TID) | ORAL | Status: DC
  Administered 2014-09-08 – 2014-09-09 (×3): 50 mg via ORAL
  Filled 2014-09-08 (×3): qty 1

## 2014-09-08 MED ORDER — ISOSORBIDE MONONITRATE ER 60 MG PO TB24
90.0000 mg | ORAL_TABLET | Freq: Every day | ORAL | Status: DC
  Administered 2014-09-09 – 2014-09-11 (×3): 90 mg via ORAL
  Filled 2014-09-08 (×6): qty 1

## 2014-09-08 MED ORDER — FUROSEMIDE 10 MG/ML IJ SOLN
40.0000 mg | Freq: Two times a day (BID) | INTRAMUSCULAR | Status: DC
  Administered 2014-09-08 – 2014-09-11 (×8): 40 mg via INTRAVENOUS
  Filled 2014-09-08 (×9): qty 4

## 2014-09-08 NOTE — Consult Note (Signed)
Name: [name] [name] MRN: [id number] DOB: 1945-07-26    ADMISSION DATE:  09/03/2014 CONSULTATION DATE:  09/08/14 REFERRING MD :  Dr. Broadus John / [facility]   CHIEF COMPLAINT:  PAH, Recurrent Pleural Effusion   BRIEF PATIENT DESCRIPTION: 69 y/o F admitted 8/25 with c/o chest pain and SOB.  Found to have a moderate R pleural effusion, R basilar atx, hypertensive urgency and decompensated diastolic dysfunction.    SIGNIFICANT EVENTS  8/25  Admit with c/o chest pain, SOB.  Work up c/w R effusion/atx, hypertenisve urgency and decompensated diastolic dysfunction 4/19  R Thoracentesis >> 1500 ml clear bloody fluid removed.  Transudative by LDH/Protein  STUDIES:  8/26  ECHO >> EF 55-60%, no RWMA, grade 3 diastolic dysfunction, RV volume/pressure overload, PA Peak 75 8/29  CTA Chest >> neg for PE, findings c/w CHF with associated large R pleural effusion, small L effusion   HISTORY OF PRESENT ILLNESS:  69 y/o F, former smoker (up to 2ppd x 54 years, quit 2012) with a PMH of HTN, HLD, Combined CHF, CAD / PVD s/p CABG + bilateral CEA, DM II, CKD, chronic hypoxic respiratory failure on 2L O2 QHS, with exertion/SOB (followed by Dr. Luan Pulling who presented to [facility] on 8/25 with complaints of chest pain and SOB.    The patient reports she has had ongoing progressive shortness of breath for the past 3-4 months.  She notes she has had a decrease in activity tolerance due to SOB.  She is unable to bathe herself.  On a good day she can walk approximately 120 ft prior to rest.  In the last few months she has only been able to navigate the distance from her bedroom to the kitchen in her home. She endorses 3-4 pillow orthopnea + bed is raised on a wedge, waking at night short of breath, dry cough, minimal sputum production, and increased lower extremity swelling up to abdomen in the last 2 months.  She denies fevers, chills, n/v/d.  Reports she is faithful with taking home antihypertensive regimen (followed by Dr.  Domenic Polite for Cardiology).    On the day of presentation, the patient woke early in the am with shortness of breath.  She attempted to eat breakfast and take her daily medications but developed nausea / vomiting.  Initial work up showed a moderate right pleural effusion with associated compressive atlectasis, BNP 3638, troponin 0.06 and patient was significantly hypertensive with initial systolic of 622.  The patient was transferred to [facility] for further evaluation.  The patient was treated with empiric abx for possible PNA, lasix, solumedrol and nitro paste.  An ECHO was assessed which showed an EF 55-60%, no RWMA, grade 3 diastolic dysfunction, RV volume/pressure overload, PA Peak 75.  She was diuresed and initially had an increase in her sr Cr to 1.48.  She is net neg 4.9 L since admission (currently on 59m BID lasix).  On 8/28, she underwent a R thoracentesis with 1.5L of fluid removed which was consistent with a transudative effusion.  Follow up CTA of the Chest on 8/29 was negative for PE but demonstrated findings consistent with CHF, associated large R pleural effusion and small left effusion. Due to size of effusion on 8/29 CT, PCCM was consulted out of concern for recurrence of pleural fluid.    [place]  She worked in tCivil engineer, contractingwork.  She had pet birds growing up and was responsible for cleaning the cages.  She relays a sPatent attorney  that she hated to do the cleaning so she would "put the birds in the oven, cook them and then back in the cage - eventually Mom quit buying them".  She started smoking at age 69 with her brother.  They would sneak cigarettes into the closet and smoke together.    PAST MEDICAL HISTORY :   has a past medical history of Mixed hyperlipidemia; Essential hypertension, benign; Depression; Cellulitis; Carotid artery disease; PAD (peripheral artery disease); Suicide attempt (08/2002); TIA (transient ischemic attack); TMJ syndrome;  Herniated disc; Ischemic cardiomyopathy; Critical lower limb ischemia; Venous insufficiency; Coronary atherosclerosis of native coronary artery; NSTEMI (non-ST elevated myocardial infarction) (11/2010); Pneumonia (2000's X 2); Type 2 diabetes mellitus; History of blood transfusion; Migraine; Stroke (08/2009); Arthritis; Chronic back pain; On home oxygen therapy; Chronic diastolic heart failure; CHF (congestive heart failure); and Chronic kidney disease.  has past surgical history that includes Appendectomy; Cholecystectomy; Abdominal hysterectomy; Carotid endarterectomy (Bilateral, 2011); Wrist fracture surgery (Left); Coronary artery bypass graft (11/24/2010); Toe Surgery (Left); Lower extremity angiogram (09/08/2012; 10/06/2013); Dilation and curettage of uterus; Tubal ligation; Debridement toe (Left); Cardiac catheterization (11/2010); Coronary angioplasty with stent (03/2013); Colonoscopy w/ biopsies and polypectomy; Fracture surgery; Femoral-popliteal Bypass Graft (Left, 10/07/2013); Intraoperative arteriogram (Left, 10/07/2013); Endarterectomy popliteal (Left, 10/07/2013); left and right heart catheterization with coronary/graft angiogram (N/A, 03/27/2013); and lower extremity angiogram (Bilateral, 09/08/2013).    Prior to Admission medications   Medication Sig Start Date End Date Taking? Authorizing Provider  albuterol (PROVENTIL HFA;VENTOLIN HFA) 108 (90 BASE) MCG/ACT inhaler Inhale 2 puffs into the lungs daily as needed for wheezing or shortness of breath.    Yes Historical Provider, MD  amLODipine (NORVASC) 10 MG tablet Take 1 tablet (10 mg total) by mouth daily. 07/09/14  Yes Satira Sark, MD  aspirin 81 MG EC tablet Take 81 mg by mouth daily. 03/29/13  Yes Brittainy Erie Noe, PA-C  atorvastatin (LIPITOR) 40 MG tablet Take 40 mg by mouth daily at 6 PM. 03/29/13  Yes Brittainy Erie Noe, PA-C  carvedilol (COREG) 12.5 MG tablet Take 18.75 mg by mouth 2 (two) times daily. 07/30/13  Yes Satira Sark,  MD  citalopram (CELEXA) 20 MG tablet Take 20 mg by mouth daily.   Yes Historical Provider, MD  clopidogrel (PLAVIX) 75 MG tablet Take 1 tablet (75 mg total) by mouth daily with breakfast. 03/16/14  Yes Satira Sark, MD  Docusate Calcium (STOOL SOFTENER PO) Take 100 mg by mouth daily as needed (for constipation).    Yes Historical Provider, MD  gabapentin (NEURONTIN) 300 MG capsule Take 300 mg by mouth daily. 08/02/14  Yes Historical Provider, MD  glimepiride (AMARYL) 2 MG tablet Take 2 mg by mouth 2 (two) times daily.    Yes Historical Provider, MD  HYDROcodone-acetaminophen (NORCO/VICODIN) 5-325 MG per tablet Take 1 tablet by mouth every 6 (six) hours as needed for moderate pain. Patient taking differently: Take 1 tablet by mouth every 8 (eight) hours as needed for moderate pain.  11/10/13  Yes Suzanne L Nickel, NP  ibuprofen (ADVIL,MOTRIN) 200 MG tablet Take 400 mg by mouth 2 (two) times daily as needed for moderate pain.   Yes Historical Provider, MD  insulin aspart (NOVOLOG) 100 UNIT/ML injection Inject 0-8 Units into the skin 3 (three) times daily with meals. >150 = 2 units, >200 = 4 units, >250 = 6 units, >300 = 8 units 03/29/13  Yes Brittainy M Simmons, PA-C  insulin detemir (LEVEMIR) 100 UNIT/ML injection Inject 22 Units into the  skin at bedtime.  03/29/13  Yes Brittainy Erie Noe, PA-C  isosorbide mononitrate (IMDUR) 30 MG 24 hr tablet Take 30 mg by mouth daily.   Yes Historical Provider, MD  lisinopril (PRINIVIL,ZESTRIL) 20 MG tablet Take 1 tablet (20 mg total) by mouth daily. 06/01/14  Yes Satira Sark, MD  Multiple Vitamin (MULTIVITAMIN WITH MINERALS) TABS tablet Take 1 tablet by mouth daily.   Yes Historical Provider, MD  Multiple Vitamins-Minerals (HAIR/SKIN/NAILS) TABS Take 1 tablet by mouth daily.   Yes Historical Provider, MD  nitroGLYCERIN (NITROSTAT) 0.4 MG SL tablet Place 1 tablet (0.4 mg total) under the tongue every 5 (five) minutes as needed for chest pain. Up to 3 doses. If  no relief after 3rd dose, proceed to the ED for an evaluation 07/30/13  Yes Satira Sark, MD  potassium chloride (K-DUR) 10 MEQ tablet Take 1 tablet (10 mEq total) by mouth daily. 06/25/14  Yes Satira Sark, MD  PRESCRIPTION MEDICATION 2 liters of Oxygen   Yes Historical Provider, MD  torsemide (DEMADEX) 20 MG tablet Take 40 mg in the am only per [facility] 02/13/14 Patient taking differently: Take 40 mg by mouth daily.  02/13/14  Yes Satira Sark, MD  Umeclidinium-Vilanterol 62.5-25 MCG/INH AEPB Inhale 1 application into the lungs daily.    Yes Historical Provider, MD  torsemide (DEMADEX) 20 MG tablet TAKE TWO TABLETS BY MOUTH IN THE MORNING AND ONE IN THE EVENING Patient not taking: Reported on 09/04/2014 07/06/14   Satira Sark, MD   Allergies  Allergen Reactions  . Other Shortness Of Breath, Rash and Other (See Comments)    All berries   . Strawberry Hives, Shortness Of Breath and Rash  . Sulfa Antibiotics Swelling  . Tape Other (See Comments)    Tears skin.  Please use "paper" tape only.    FAMILY HISTORY:  family history includes Cancer in her brother and sister; Coronary artery disease in her father; Diabetes in her daughter, mother, and son; Diabetes type II in her mother; Heart attack in her father; Heart disease in her father and mother; Heart failure in her sister; Hyperlipidemia in her daughter, mother, and son; Hypertension in her mother.   SOCIAL HISTORY:  reports that she quit smoking about 4 years ago. Her smoking use included Cigarettes. She started smoking about 58 years ago. She has a 150 pack-year smoking history. She has never used smokeless tobacco. She reports that she drinks alcohol. She reports that she uses illicit drugs (Marijuana).  REVIEW OF SYSTEMS:   Constitutional: Negative for fever, chills, weight loss, malaise/fatigue and diaphoresis.  HENT: Negative for hearing loss, ear pain, nosebleeds, congestion, sore throat, neck pain, tinnitus and ear  discharge.   Eyes: Negative for blurred vision, double vision, photophobia, pain, discharge and redness.  Respiratory: Negative for hemoptysis, sputum production, wheezing and stridor.  Reports SOB with exertion, at rest, decreased ADL tolerance Cardiovascular: Negative for palpitations, claudication, Reports chest pain, 3-4 pillow orthopnea, leg swelling and PND.  Gastrointestinal: Negative for heartburn, nausea, vomiting, abdominal pain, diarrhea, constipation, blood in stool and melena.  Genitourinary: Negative for dysuria, urgency, frequency, hematuria and flank pain.  Musculoskeletal: Negative for myalgias, back pain, joint pain and falls.  Skin: Negative for itching and rash.  Neurological: Negative for dizziness, tingling, tremors, sensory change, speech change, focal weakness, seizures, loss of consciousness, weakness and headaches.  Endo/Heme/Allergies: Negative for environmental allergies and polydipsia. Does not bruise/bleed easily.  SUBJECTIVE:   VITAL SIGNS: Temp:  Reina.Alexander  F (36.7 C)-99.4 F (37.4 C)] 98.4 F (36.9 C) (08/30 0746) Pulse Rate:  [47-258] 258 (08/30 0746) Resp:  [18-20] 18 (08/30 0746) BP: (147-172)/(35-72) 172/46 mmHg (08/30 0746) SpO2:  [91 %-97 %] 95 % (08/30 0746) Weight:  [190 lb 0.6 oz (86.2 kg)] 190 lb 0.6 oz (86.2 kg) (08/30 0328)  PHYSICAL EXAMINATION: General:  Chronically ill adult female in NAD Neuro:  AAOx4, speech clear, MAE HEENT:  MM pink/moist, JVD up to the angle of the jaw Cardiovascular:  s1s2 rrr, no m/r/g Lungs:  resp's even/non-labored on 4L O2, diminished on R, dullness to percussion 2/3 way up Abdomen:  Obese/soft, bsx4 active  Musculoskeletal:  No acute deformities  Skin:  Warm/dry, LE with evidence of chronic venous stasis, reduced swelling, long scar on left wrist   Recent Labs Lab 09/06/14 0305 09/07/14 0230 09/08/14 0354  NA 136 137 139  K 4.1 3.8 4.4  CL 100* 103 104  CO2 29 30 32  BUN 48* 38* 25*  CREATININE 1.43*  1.18* 1.05*  GLUCOSE 150* 94 124*    Recent Labs Lab 09/06/14 0305 09/07/14 0230 09/08/14 0354  HGB 9.4* 9.5* 9.7*  HCT 29.8* 29.9* 30.6*  WBC 6.7 6.7 5.5  PLT 164 178 148*   Dg Chest 1 View  09/06/2014   CLINICAL DATA:  Status post right-sided thoracentesis.  EXAM: CHEST  1 VIEW  COMPARISON:  1 day prior  FINDINGS: Patient rotated right. Prior median sternotomy. Midline trachea. Mild cardiomegaly. Resolution of right pleural effusion. No apical pneumothorax. Right pleural thickening inferiorly. Cannot exclude trace air inferior and laterally. No left-sided pleural effusion. Interstitial edema is decreased, mild. No lobar consolidation.  IMPRESSION: Status post right-sided thoracentesis with resolution of right pleural effusion. No apical pneumothorax. Cannot exclude trace inferior lateral right pleural air. Recommend attention on follow-up.  Improved interstitial edema.   Electronically Signed   By: Abigail Miyamoto M.D.   On: 09/06/2014 10:49   Ct Angio Chest Pe W/cm &/or Wo Cm  09/07/2014   CLINICAL DATA:  Shortness of breath for several days. New chest pain with inspiration. Status post thoracentesis 09/06/2014. The patient's pain is to the right of midline. No known injury. Subsequent encounter.  EXAM: CT ANGIOGRAPHY CHEST WITH CONTRAST  TECHNIQUE: Multidetector CT imaging of the chest was performed using the standard protocol during bolus administration of intravenous contrast. Multiplanar CT image reconstructions and MIPs were obtained to evaluate the vascular anatomy.  CONTRAST:  80 mL OMNIPAQUE IOHEXOL 350 MG/ML SOLN  COMPARISON:  CT chest 12/22/2013 and 07/28/2013. Single view of the chest 09/05/2013.  FINDINGS: No pulmonary embolus is identified. There is cardiomegaly. A very small left pleural effusion is noted. A moderate to moderately large right pleural effusion is seen. There is no pericardial effusion. As on prior examinations, there are some mildly prominent lymph nodes in the  mediastinum and hila including a 1.4 cm pretracheal node on image 41 and 1.6 cm right hilar lymph node on image 50. No new or enlarging lymph nodes are identified in the axilla, hila or mediastinum. The patient is status post CABG.  Scattered ground-glass attenuation is present with interlobular septal thickening. There is no pneumothorax. Mild compressive atelectasis is more notable on the right. No consolidative process, nodule or mass is identified.  Visualized upper abdomen shows small amount of perihepatic ascites. Imaged intra-abdominal contents are otherwise unremarkable. No lytic or sclerotic bony lesion is seen.  Review of the MIP images confirms the above findings.  IMPRESSION:  Negative for pulmonary embolus.  Findings most consistent with congestive heart failure with an associated moderate to moderately large right pleural effusion.  Very small left pleural effusion after thoracentesis. Negative for pneumothorax.  Small volume of perihepatic ascites.  Mildly enlarged mediastinal and hilar lymph nodes are chronic and likely reactive.   Electronically Signed   By: Inge Rise M.D.   On: 09/07/2014 09:55   US Thoracentesis Asp Pleural Space W/img Guide  09/06/2014   CLINICAL DATA:  CHF, right pleural effusion, request for right thoracentesis  EXAM: ULTRASOUND GUIDED RIGHT THORACENTESIS  COMPARISON:  None.  PROCEDURE: An ultrasound guided thoracentesis was thoroughly discussed with the patient and questions answered. The benefits, risks, alternatives and complications were also discussed. The patient understands and wishes to proceed with the procedure. Written consent was obtained.  Ultrasound was performed to localize and mark an adequate pocket of fluid in the right chest. The area was then prepped and draped in the normal sterile fashion. 1% Lidocaine was used for local anesthesia. Under ultrasound guidance a Safe T Centesis catheter was introduced. Thoracentesis was performed. The catheter was  removed and a dressing applied.  COMPLICATIONS: None immediate.  FINDINGS: A total of approximately 1500 mls of clear bloody fluid was removed. A fluid sample wassent for laboratory analysis.  IMPRESSION: Successful ultrasound guided right thoracentesis yielding 1500 mls of pleural fluid.  Read by:  Gareth Eagle, PA-C   Electronically Signed   By: Jerilynn Mages.  Shick M.D.   On: 09/06/2014 10:42    ASSESSMENT / PLAN:  Bilateral Pleural Effusions - moderate to large, R>L pleural effusion dating back to November of 2015 based on radiographic review. Patient is s/p R thoracentesis on 8/28 with 1.5 L transudative fluid removed.  Follow up CT on 8/29 showed moderate residual R effusion - doubt she was completely drained dry and she continues to be hypertensive.  Suspect effusions are secondary to decompensated CHF, diastolic dysfunction and hypertensive crisis given current work up.    Plan:  Negative balance as renal function / BP permit Intermittent CXR, f/u in am 8/31 Follow pleural cultures / cytology given hx of extensive smoking Consider repeat thora to tap dry if patient has increasing symptoms & no response to diuresis   PAH   Plan: Will need to repeat ECHO once evolemic to accurately assess PA pressures   Chronic Hypoxic Respiratory Failure Former Tobacco Abuse   COPD - followed by Dr. Luan Pulling in [place].  Plan: O2 as needed to support saturations 90-95% ? If patient is compliant with O2 at home Will need ambulatory assessment prior to discharge on O2 for titration  Continue Anoro PRN Albuterol   Hypertensive Crisis  Grade III Diastolic Dysfunction  Combined Systolic & Diastolic CHF Chest Pain / CAD / PAD  Plan: Cardiology following Lasix 40 mg BID  Aggressive BP control DNR   Renal Insufficiency - rising sr cr after admission, peak 1.43 from 1.17.  Improved 8/30 to 1.05  Plan: Lasix as above  Monitor sr cr / UOP  Noe Gens, NP-C [facility] Pgr:  [phone] or if no answer [phone] 09/08/2014, 9:15 AM

## 2014-09-08 NOTE — Progress Notes (Signed)
.tk   Subjective:  C/O more shortness of breath c/w yesterday. Notes some chest pain with deep breathing  Objective:   Vital Signs : Filed Vitals:   09/08/14 0018 09/08/14 0328 09/08/14 0400 09/08/14 0746  BP:   166/56 172/46  Pulse:   51 258  Temp: 99.4 F (37.4 C) 98.5 F (36.9 C)  98.4 F (36.9 C)  TempSrc: Oral Oral  Oral  Resp:    18  Height:      Weight:  190 lb 0.6 oz (86.2 kg)    SpO2:   96% 95%    Intake/Output from previous day:  Intake/Output Summary (Last 24 hours) at 09/08/14 1004 Last data filed at 09/08/14 0800  Gross per 24 hour  Intake    480 ml  Output    800 ml  Net   -320 ml    I/O since admission: -4995  Wt Readings from Last 3 Encounters:  09/08/14 190 lb 0.6 oz (86.2 kg)  05/13/14 198 lb (89.812 kg)  02/25/14 197 lb (89.359 kg)    Medications: . amLODipine  10 mg Oral Daily  . antiseptic oral rinse  7 mL Mouth Rinse BID  . atorvastatin  40 mg Oral q1800  . carvedilol  25 mg Oral BID WC  . Chlorhexidine Gluconate Cloth  6 each Topical Q0600  . citalopram  20 mg Oral Daily  . clopidogrel  75 mg Oral Q breakfast  . enoxaparin (LOVENOX) injection  40 mg Subcutaneous Q24H  . furosemide  40 mg Intravenous BID  . gabapentin  300 mg Oral Daily  . hydrALAZINE  37.5 mg Oral 3 times per day  . insulin aspart  0-9 Units Subcutaneous TID WC  . insulin aspart  3 Units Subcutaneous TID WC  . insulin detemir  20 Units Subcutaneous QHS  . isosorbide mononitrate  60 mg Oral Daily  . multivitamin with minerals  1 tablet Oral Daily  . mupirocin ointment   Nasal BID  . potassium chloride  10 mEq Oral Daily  . sodium chloride  3 mL Intravenous Q12H  . Umeclidinium-Vilanterol   Inhalation Daily       Physical Exam:   General appearance: alert, cooperative and mild discomfort with deep breathing Neck: no adenopathy, no carotid bruit and supple, symmetrical, trachea midline Lungs: decreased BS R lower lung field; no wheezing Chest wall; nontender  to palpation Heart: SB 1/6 sem , no gallop Abdomen: soft, non-tender; bowel sounds normal; no masses,  no organomegaly Extremities: extremities normal, atraumatic, no cyanosis or edema Pulses: 2+ and symmetric Skin: small hemorrhagic lesion left forearm Neurologic: Grossly normal   Rate: 65  Rhythm: NSR  ECG (independently read by me): 09/07/2014 SB at 51; PRWP; QTc 464 msec  Lab Results:   Recent Labs  09/06/14 0305 09/07/14 0230 09/08/14 0354  NA 136 137 139  K 4.1 3.8 4.4  CL 100* 103 104  CO2 29 30 32  GLUCOSE 150* 94 124*  BUN 48* 38* 25*  CREATININE 1.43* 1.18* 1.05*  CALCIUM 8.6* 8.5* 8.6*    Hepatic Function Latest Ref Rng 09/06/2014 03/19/2013 11/22/2010  Total Protein 6.5 - 8.1 g/dL 6.2(L) 6.4 7.4  Albumin 3.5 - 5.2 g/dL - 2.6(L) 3.6  AST 0 - 37 U/L - 26 18  ALT 0 - 35 U/L - 26 15  Alk Phosphatase 39 - 117 U/L - 98 85  Total Bilirubin 0.3 - 1.2 mg/dL - 0.3 0.3     Recent Labs  09/06/14 0305 09/07/14 0230 09/08/14 0354  WBC 6.7 6.7 5.5  HGB 9.4* 9.5* 9.7*  HCT 29.8* 29.9* 30.6*  MCV 92.5 90.3 91.6  PLT 164 178 148*    No results for input(s): TROPONINI in the last 72 hours.  Invalid input(s): CK, MB  Lab Results  Component Value Date   TSH 3.001 03/18/2013   No results for input(s): HGBA1C in the last 72 hours.   Recent Labs  09/06/14 1157  PROT 6.2*   No results for input(s): INR in the last 72 hours. BNP (last 3 results) No results for input(s): BNP in the last 8760 hours.  ProBNP (last 3 results) No results for input(s): PROBNP in the last 8760 hours.   Lipid Panel     Component Value Date/Time   CHOL 114 10/13/2010 0401   TRIG 96 10/13/2010 0401   HDL 31* 10/13/2010 0401   CHOLHDL 3.7 10/13/2010 0401   VLDL 19 10/13/2010 0401   LDLCALC 64 10/13/2010 0401     Imaging:  Dg Chest 1 View  09/06/2014   CLINICAL DATA:  Status post right-sided thoracentesis.  EXAM: CHEST  1 VIEW  COMPARISON:  1 day prior  FINDINGS: Patient  rotated right. Prior median sternotomy. Midline trachea. Mild cardiomegaly. Resolution of right pleural effusion. No apical pneumothorax. Right pleural thickening inferiorly. Cannot exclude trace air inferior and laterally. No left-sided pleural effusion. Interstitial edema is decreased, mild. No lobar consolidation.  IMPRESSION: Status post right-sided thoracentesis with resolution of right pleural effusion. No apical pneumothorax. Cannot exclude trace inferior lateral right pleural air. Recommend attention on follow-up.  Improved interstitial edema.   Electronically Signed   By: Abigail Miyamoto M.D.   On: 09/06/2014 10:49   Ct Angio Chest Pe W/cm &/or Wo Cm  09/07/2014   CLINICAL DATA:  Shortness of breath for several days. New chest pain with inspiration. Status post thoracentesis 09/06/2014. The patient's pain is to the right of midline. No known injury. Subsequent encounter.  EXAM: CT ANGIOGRAPHY CHEST WITH CONTRAST  TECHNIQUE: Multidetector CT imaging of the chest was performed using the standard protocol during bolus administration of intravenous contrast. Multiplanar CT image reconstructions and MIPs were obtained to evaluate the vascular anatomy.  CONTRAST:  80 mL OMNIPAQUE IOHEXOL 350 MG/ML SOLN  COMPARISON:  CT chest 12/22/2013 and 07/28/2013. Single view of the chest 09/05/2013.  FINDINGS: No pulmonary embolus is identified. There is cardiomegaly. A very small left pleural effusion is noted. A moderate to moderately large right pleural effusion is seen. There is no pericardial effusion. As on prior examinations, there are some mildly prominent lymph nodes in the mediastinum and hila including a 1.4 cm pretracheal node on image 41 and 1.6 cm right hilar lymph node on image 50. No new or enlarging lymph nodes are identified in the axilla, hila or mediastinum. The patient is status post CABG.  Scattered ground-glass attenuation is present with interlobular septal thickening. There is no pneumothorax. Mild  compressive atelectasis is more notable on the right. No consolidative process, nodule or mass is identified.  Visualized upper abdomen shows small amount of perihepatic ascites. Imaged intra-abdominal contents are otherwise unremarkable. No lytic or sclerotic bony lesion is seen.  Review of the MIP images confirms the above findings.  IMPRESSION: Negative for pulmonary embolus.  Findings most consistent with congestive heart failure with an associated moderate to moderately large right pleural effusion.  Very small left pleural effusion after thoracentesis. Negative for pneumothorax.  Small volume of perihepatic ascites.  Mildly enlarged mediastinal and hilar lymph nodes are chronic and likely reactive.   Electronically Signed   By: Inge Rise M.D.   On: 09/07/2014 09:55   US Thoracentesis Asp Pleural Space W/img Guide  09/06/2014   CLINICAL DATA:  CHF, right pleural effusion, request for right thoracentesis  EXAM: ULTRASOUND GUIDED RIGHT THORACENTESIS  COMPARISON:  None.  PROCEDURE: An ultrasound guided thoracentesis was thoroughly discussed with the patient and questions answered. The benefits, risks, alternatives and complications were also discussed. The patient understands and wishes to proceed with the procedure. Written consent was obtained.  Ultrasound was performed to localize and mark an adequate pocket of fluid in the right chest. The area was then prepped and draped in the normal sterile fashion. 1% Lidocaine was used for local anesthesia. Under ultrasound guidance a Safe T Centesis catheter was introduced. Thoracentesis was performed. The catheter was removed and a dressing applied.  COMPLICATIONS: None immediate.  FINDINGS: A total of approximately 1500 mls of clear bloody fluid was removed. A fluid sample wassent for laboratory analysis.  IMPRESSION: Successful ultrasound guided right thoracentesis yielding 1500 mls of pleural fluid.  Read by:  Gareth Eagle, PA-C   Electronically Signed   By:  Jerilynn Mages.  Shick M.D.   On: 09/06/2014 10:42      Assessment/Plan:   Principal Problem:   Hypertensive urgency Active Problems:   Hypertension, uncontrolled   Diabetes mellitus type II, uncontrolled   Acute on chronic combined systolic and diastolic heart failure   Pleural effusion, right   Acute on chronic respiratory failure with hypoxia   Pressure ulcer   CHF (congestive heart failure)   Acute on chronic diastolic congestive heart failure  1. Hypertensive crisis:  BP still elevated was 192 earlier today now 157 on amlodipine 10 mg, coreg 25 mg bid, hydralzine 37/5 mg tid, and nitrates.   2. Chest Pain/CAD: Negative troponin during this admission.  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 Placed on DAPT with ASA/plavix. Chest pain with some features different than prior angina, ?pleuritic component; f/u ECG today. Will empirically increase nitrates. 3. Acute diastolic dysfunction; grade 3;  EF 55-60%;  4. Pleural effusion: s/p 1.5 liter thoracentesis; with pleuritic symptoms and shortness of breath will re check CXR today.  5. Intermediate VQ scan with negative for PE CT scan 6. Renal insufficiency; better today 7. Anticoagulation; will change enoxaparon to VTE dose 8. Anemia   [name] Sine, MD, [facility] 09/08/2014, 10:04 AM

## 2014-09-08 NOTE — Progress Notes (Signed)
Stool sample sent to Lab for R/O C-Diff. Patient having frequent loose stools. Will wait for results.

## 2014-09-08 NOTE — Progress Notes (Addendum)
[facility] PROGRESS NOTE  [name] [name] AEW:[id number] DOB: 30-Sep-1945 DOA: 09/03/2014 PCP: Glenda Chroman., MD  Assessment/Plan: Acute on chronic resp hypoxic failure -due to Systolic and diastolic CHF with R sided pleural effusion, and Severe PAH -Diuresed with IV lasix, negative 4.8L due to bump in creatinine will held lasix 8/28, resume lasix, 31m Q12 -Moderate sized R pleural effusion, s/p [place]ided Thoracentesis 1500cc, transudative effusion based on protein and LDH, blood tinged, possibly traumatic tap, await cytology -ECHo 8/26 with normal EF, grade 3 DD and Severe PAH, peak PA pressure of 733mg-could be due to COPD/OSA and CHF/pleural effusions -Cards following -Will consult pulm due to severe PAH and recurrent R pleural effusion  Severe PAH -PA pressures in 3/15 were 3778m, and from 8/26 is 88m39m-VQ was intermediate prob, but CTA negative for PE, dopplers negative -has underlying COPD, with pleural effusions which are likely contributing  R pleural effusions -s/p US tKoreara on 8/28 with 1500cc drained, transudative by LDH and protein -cytology and culture pending, gram stain negative -CTA yetserday within 24h of thora shows mod to large R effusion again -will ask Pulm to eval  CAD/CABG -last PCI and stent in 3/15 -continue ASA/plavix/coreg/statin/imdur  DM -continue levemir, cut down dose,  meal coverage and SSI  Uncontrolled HTN -improving -diuretics -continue amlodipine, hydralazine, coreg, ACE stopped due to AKI  COPD/chronic resp failure -on home O2 2l, no wheezing -nebs PRN  Dyslipidemia -continue statin  Diarrhea -overnight, check Cdiff PCR  DVt proph: hep SQ  Code Status: DNR Family Communication: none at bedside Disposition Plan: Keep in SDU   Consultants:  Cards  HPI/Subjective: Still with some pleuritic chest pain, but improving, still with mild to mod dyspnea  Objective: Filed Vitals:   09/08/14 0746  BP: 172/46  Pulse: 258   Temp: 98.4 F (36.9 C)  Resp: 18    Intake/Output Summary (Last 24 hours) at 09/08/14 0832 Last data filed at 09/08/14 0328  Gross per 24 hour  Intake    720 ml  Output    800 ml  Net    -80 ml   Filed Weights   09/06/14 0300 09/07/14 0500 09/08/14 0328  Weight: 89.1 kg (196 lb 6.9 oz) 86.7 kg (191 lb 2.2 oz) 86.2 kg (190 lb 0.6 oz)    Exam:   General:  AAOx3  Cardiovascular: S1S2/RRR  Respiratory: decreased BS both bases  Abdomen: soft, nT, BS present  Musculoskeletal: trace edema, scars from prior surgery   Data Reviewed: Basic Metabolic Panel:  Recent Labs Lab 09/04/14 0510 09/05/14 0254 09/06/14 0305 09/07/14 0230 09/08/14 0354  NA 137 136 136 137 139  K 4.2 4.4 4.1 3.8 4.4  CL 100* 100* 100* 103 104  CO2 _0 32  GLUCOSE 266* 151* 150* 94 124*  BUN 34* 48* 48* 38* 25*  CREATININE 1.17* 1.32* 1.43* 1.18* 1.05*  CALCIUM 9.2 8.5* 8.6* 8.5* 8.6*   Liver Function Tests:  Recent Labs Lab 09/06/14 1157  PROT 6.2*   No results for input(s): LIPASE, AMYLASE in the last 168 hours. No results for input(s): AMMONIA in the last 168 hours. CBC:  Recent Labs Lab 09/05/14 0254 09/06/14 0305 09/07/14 0230 09/08/14 0354  WBC 7.2 6.7 6.7 5.5  HGB 9.2* 9.4* 9.5* 9.7*  HCT 29.2* 29.8* 29.9* 30.6*  MCV 90.7 92.5 90.3 91.6  PLT 170 164 178 148*   Cardiac Enzymes:  Recent Labs Lab 09/04/14 09/04/14 0510 09/04/14 1142  TROPONINI 0.03 0.04* 0.03  BNP (last 3 results) No results for input(s): BNP in the last 8760 hours.  ProBNP (last 3 results) No results for input(s): PROBNP in the last 8760 hours.  CBG:  Recent Labs Lab 09/07/14 1236 09/07/14 1636 09/07/14 2107 09/08/14 0743 09/08/14 0809  GLUCAP 138* 118* 232* 57* 97    Recent Results (from the past 240 hour(s))  MRSA PCR Screening     Status: Abnormal   Collection Time: 09/03/14 10:59 PM  Result Value Ref Range Status   MRSA by PCR POSITIVE (A) NEGATIVE Final    Comment:         The GeneXpert MRSA Assay (FDA approved for NASAL specimens only), is one component of a comprehensive MRSA colonization surveillance program. It is not intended to diagnose MRSA infection nor to guide or monitor treatment for MRSA infections. RESULT CALLED TO, READ BACK BY AND VERIFIED WITH: H YANBU _0  09/04/14   Culture, body fluid-bottle     Status: None (Preliminary result)   Collection Time: 09/06/14 10:40 AM  Result Value Ref Range Status   Specimen Description FLUID RIGHT PLEURAL  Final   Special Requests NONE  Final   Culture NO GROWTH < 24 HOURS  Final   Report Status PENDING  Incomplete  Gram stain     Status: None   Collection Time: 09/06/14 10:40 AM  Result Value Ref Range Status   Specimen Description FLUID RIGHT PLEURAL  Final   Special Requests BAA 10CCS  Final   Gram Stain   Final    FEW WBC PRESENT,BOTH PMN AND MONONUCLEAR NO ORGANISMS SEEN    Report Status 09/06/2014 FINAL  Final     Studies: Dg Chest 1 View  09/06/2014   CLINICAL DATA:  Status post right-sided thoracentesis.  EXAM: CHEST  1 VIEW  COMPARISON:  1 day prior  FINDINGS: Patient rotated right. Prior median sternotomy. Midline trachea. Mild cardiomegaly. Resolution of right pleural effusion. No apical pneumothorax. Right pleural thickening inferiorly. Cannot exclude trace air inferior and laterally. No left-sided pleural effusion. Interstitial edema is decreased, mild. No lobar consolidation.  IMPRESSION: Status post right-sided thoracentesis with resolution of right pleural effusion. No apical pneumothorax. Cannot exclude trace inferior lateral right pleural air. Recommend attention on follow-up.  Improved interstitial edema.   Electronically Signed   By: Abigail Miyamoto M.D.   On: 09/06/2014 10:49   Ct Angio Chest Pe W/cm &/or Wo Cm  09/07/2014   CLINICAL DATA:  Shortness of breath for several days. New chest pain with inspiration. Status post thoracentesis 09/06/2014. The patient's pain is to  the right of midline. No known injury. Subsequent encounter.  EXAM: CT ANGIOGRAPHY CHEST WITH CONTRAST  TECHNIQUE: Multidetector CT imaging of the chest was performed using the standard protocol during bolus administration of intravenous contrast. Multiplanar CT image reconstructions and MIPs were obtained to evaluate the vascular anatomy.  CONTRAST:  80 mL OMNIPAQUE IOHEXOL 350 MG/ML SOLN  COMPARISON:  CT chest 12/22/2013 and 07/28/2013. Single view of the chest 09/05/2013.  FINDINGS: No pulmonary embolus is identified. There is cardiomegaly. A very small left pleural effusion is noted. A moderate to moderately large right pleural effusion is seen. There is no pericardial effusion. As on prior examinations, there are some mildly prominent lymph nodes in the mediastinum and hila including a 1.4 cm pretracheal node on image 41 and 1.6 cm right hilar lymph node on image 50. No new or enlarging lymph nodes are identified in the axilla, hila or mediastinum. The patient is  status post CABG.  Scattered ground-glass attenuation is present with interlobular septal thickening. There is no pneumothorax. Mild compressive atelectasis is more notable on the right. No consolidative process, nodule or mass is identified.  Visualized upper abdomen shows small amount of perihepatic ascites. Imaged intra-abdominal contents are otherwise unremarkable. No lytic or sclerotic bony lesion is seen.  Review of the MIP images confirms the above findings.  IMPRESSION: Negative for pulmonary embolus.  Findings most consistent with congestive heart failure with an associated moderate to moderately large right pleural effusion.  Very small left pleural effusion after thoracentesis. Negative for pneumothorax.  Small volume of perihepatic ascites.  Mildly enlarged mediastinal and hilar lymph nodes are chronic and likely reactive.   Electronically Signed   By: Inge Rise M.D.   On: 09/07/2014 09:55   US Thoracentesis Asp Pleural Space W/img  Guide  09/06/2014   CLINICAL DATA:  CHF, right pleural effusion, request for right thoracentesis  EXAM: ULTRASOUND GUIDED RIGHT THORACENTESIS  COMPARISON:  None.  PROCEDURE: An ultrasound guided thoracentesis was thoroughly discussed with the patient and questions answered. The benefits, risks, alternatives and complications were also discussed. The patient understands and wishes to proceed with the procedure. Written consent was obtained.  Ultrasound was performed to localize and mark an adequate pocket of fluid in the right chest. The area was then prepped and draped in the normal sterile fashion. 1% Lidocaine was used for local anesthesia. Under ultrasound guidance a Safe T Centesis catheter was introduced. Thoracentesis was performed. The catheter was removed and a dressing applied.  COMPLICATIONS: None immediate.  FINDINGS: A total of approximately 1500 mls of clear bloody fluid was removed. A fluid sample wassent for laboratory analysis.  IMPRESSION: Successful ultrasound guided right thoracentesis yielding 1500 mls of pleural fluid.  Read by:  Gareth Eagle, PA-C   Electronically Signed   By: Jerilynn Mages.  Shick M.D.   On: 09/06/2014 10:42    Scheduled Meds: . amLODipine  10 mg Oral Daily  . antiseptic oral rinse  7 mL Mouth Rinse BID  . atorvastatin  40 mg Oral q1800  . carvedilol  25 mg Oral BID WC  . Chlorhexidine Gluconate Cloth  6 each Topical Q0600  . citalopram  20 mg Oral Daily  . clopidogrel  75 mg Oral Q breakfast  . enoxaparin (LOVENOX) injection  40 mg Subcutaneous Q24H  . gabapentin  300 mg Oral Daily  . hydrALAZINE  37.5 mg Oral 3 times per day  . insulin aspart  0-9 Units Subcutaneous TID WC  . insulin aspart  3 Units Subcutaneous TID WC  . insulin detemir  20 Units Subcutaneous QHS  . isosorbide mononitrate  60 mg Oral Daily  . multivitamin with minerals  1 tablet Oral Daily  . mupirocin ointment   Nasal BID  . potassium chloride  10 mEq Oral Daily  . sodium chloride  3 mL  Intravenous Q12H  . Umeclidinium-Vilanterol   Inhalation Daily   Continuous Infusions:  Antibiotics Given (last 72 hours)    None      Principal Problem:   Hypertensive urgency Active Problems:   Hypertension, uncontrolled   Diabetes mellitus type II, uncontrolled   Acute on chronic combined systolic and diastolic heart failure   Pleural effusion, right   Acute on chronic respiratory failure with hypoxia   Pressure ulcer   CHF (congestive heart failure)   Acute on chronic diastolic congestive heart failure    Time spent: 8mn    Texanna Hilburn  [facility] Pager [phone]. If 7PM-7AM, please contact night-coverage at [email or web address], password [facility] 09/08/2014, 8:32 AM  LOS: 5 days

## 2014-09-09 ENCOUNTER — Inpatient Hospital Stay (HOSPITAL_COMMUNITY): Payer: Medicare Other

## 2014-09-09 DIAGNOSIS — R0600 Dyspnea, unspecified: Secondary | ICD-10-CM

## 2014-09-09 DIAGNOSIS — J9621 Acute and chronic respiratory failure with hypoxia: Secondary | ICD-10-CM

## 2014-09-09 DIAGNOSIS — E1165 Type 2 diabetes mellitus with hyperglycemia: Secondary | ICD-10-CM

## 2014-09-09 LAB — BASIC METABOLIC PANEL
Anion gap: 8 (ref 5–15)
BUN: 27 mg/dL — AB (ref 6–20)
CALCIUM: 8.4 mg/dL — AB (ref 8.9–10.3)
CO2: 28 mmol/L (ref 22–32)
CREATININE: 1.18 mg/dL — AB (ref 0.44–1.00)
Chloride: 102 mmol/L (ref 101–111)
GFR, EST AFRICAN AMERICAN: 54 mL/min — AB (ref >60)
GFR, EST NON AFRICAN AMERICAN: 47 mL/min — AB (ref >60)
Glucose, Bld: 88 mg/dL (ref 65–99)
Potassium: 4.6 mmol/L (ref 3.5–5.1)
SODIUM: 138 mmol/L (ref 135–145)

## 2014-09-09 LAB — CBC
HCT: 30.8 % — ABNORMAL LOW (ref 36.0–46.0)
Hemoglobin: 9.4 g/dL — ABNORMAL LOW (ref 12.0–15.0)
MCH: 28.1 pg (ref 26.0–34.0)
MCHC: 30.5 g/dL (ref 30.0–36.0)
MCV: 92.2 fL (ref 78.0–100.0)
PLATELETS: 164 10*3/uL (ref 150–400)
RBC: 3.34 MIL/uL — ABNORMAL LOW (ref 3.87–5.11)
RDW: 15.5 % (ref 11.5–15.5)
WBC: 5.6 10*3/uL (ref 4.0–10.5)

## 2014-09-09 LAB — GLUCOSE, CAPILLARY
GLUCOSE-CAPILLARY: 74 mg/dL (ref 65–99)
GLUCOSE-CAPILLARY: 141 mg/dL — AB (ref 65–99)
Glucose-Capillary: 145 mg/dL — ABNORMAL HIGH (ref 65–99)
Glucose-Capillary: 114 mg/dL — ABNORMAL HIGH (ref 65–99)

## 2014-09-09 LAB — BRAIN NATRIURETIC PEPTIDE: B NATRIURETIC PEPTIDE 5: 436.8 pg/mL — AB (ref 0.0–100.0)

## 2014-09-09 MED ORDER — GABAPENTIN 300 MG PO CAPS
300.0000 mg | ORAL_CAPSULE | Freq: Every day | ORAL | Status: DC
  Filled 2014-09-09: qty 1

## 2014-09-09 MED ORDER — HYDRALAZINE HCL 50 MG PO TABS
75.0000 mg | ORAL_TABLET | Freq: Three times a day (TID) | ORAL | Status: DC
  Administered 2014-09-09 – 2014-09-12 (×9): 75 mg via ORAL
  Filled 2014-09-09 (×18): qty 1

## 2014-09-09 NOTE — Progress Notes (Signed)
  Echocardiogram 2D Echocardiogram has been performed.  [name] [name] 09/09/2014, 10:42 AM

## 2014-09-09 NOTE — Progress Notes (Signed)
Report called to [place] on unit [place]. Patient to be transferred when done with eating meal.

## 2014-09-09 NOTE — Progress Notes (Signed)
[facility]  Patient Status: Active (receiving services up to time of hospitalization)  [facility] is providing the following services: RN  If patient discharges after hours, please call [phone].   [name] 09/09/2014, 10:08 AM

## 2014-09-09 NOTE — Progress Notes (Signed)
.tk   Subjective:  Shortness of breath improved; intermittent stabbing chest sensation  Objective:   Vital Signs : Filed Vitals:   09/09/14 0500 09/09/14 0621 09/09/14 0726 09/09/14 0802  BP:  183/29 162/35 162/35  Pulse:   55 56  Temp:   98 F (36.7 C)   TempSrc:   Oral   Resp:   18   Height:      Weight: 190 lb 11.2 oz (86.5 kg)     SpO2:   93%     Intake/Output from previous day:  Intake/Output Summary (Last 24 hours) at 09/09/14 0859 Last data filed at 09/09/14 0400  Gross per 24 hour  Intake    100 ml  Output    850 ml  Net   -750 ml    I/O since admission: -5545  Wt Readings from Last 3 Encounters:  09/09/14 190 lb 11.2 oz (86.5 kg)  05/13/14 198 lb (89.812 kg)  02/25/14 197 lb (89.359 kg)    Medications: . amLODipine  10 mg Oral Daily  . antiseptic oral rinse  7 mL Mouth Rinse BID  . atorvastatin  40 mg Oral q1800  . carvedilol  25 mg Oral BID WC  . Chlorhexidine Gluconate Cloth  6 each Topical Q0600  . citalopram  20 mg Oral Daily  . clopidogrel  75 mg Oral Q breakfast  . enoxaparin (LOVENOX) injection  40 mg Subcutaneous Q24H  . furosemide  40 mg Intravenous BID  . gabapentin  300 mg Oral Daily  . hydrALAZINE  50 mg Oral 3 times per day  . insulin aspart  0-9 Units Subcutaneous TID WC  . insulin aspart  3 Units Subcutaneous TID WC  . insulin detemir  20 Units Subcutaneous QHS  . isosorbide mononitrate  90 mg Oral Daily  . multivitamin with minerals  1 tablet Oral Daily  . mupirocin ointment   Nasal BID  . potassium chloride  10 mEq Oral Daily  . sodium chloride  3 mL Intravenous Q12H  . Umeclidinium-Vilanterol   Inhalation Daily       Physical Exam:   General appearance: alert, cooperative and mild discomfort with deep breathing Neck: no adenopathy, no carotid bruit and supple, symmetrical, trachea midline Lungs: decreased BS R lower lung field; no wheezing Chest wall; nontender to palpation Heart: SB 1/6 sem , no gallop Abdomen: soft,  non-tender; bowel sounds normal; no masses,  no organomegaly Extremities: extremities normal, atraumatic, no cyanosis or edema Pulses: 2+ and symmetric Skin: small hemorrhagic lesion left forearm Neurologic: Grossly normal   Rate: 65  Rhythm: NSR  ECG (independently read by me): 09/08/2014 SB at 53; no acute STT changes  09/07/2014 SB at 51; PRWP; QTc 464 msec  Lab Results:   Recent Labs  09/07/14 0230 09/08/14 0354 09/09/14 0245  NA 137 139 138  K 3.8 4.4 4.6  CL 103 104 102  CO2 30 32 28  GLUCOSE 94 124* 88  BUN 38* 25* 27*  CREATININE 1.18* 1.05* 1.18*  CALCIUM 8.5* 8.6* 8.4*    Hepatic Function Latest Ref Rng 09/06/2014 03/19/2013 11/22/2010  Total Protein 6.5 - 8.1 g/dL 6.2(L) 6.4 7.4  Albumin 3.5 - 5.2 g/dL - 2.6(L) 3.6  AST 0 - 37 U/L - 26 18  ALT 0 - 35 U/L - 26 15  Alk Phosphatase 39 - 117 U/L - 98 85  Total Bilirubin 0.3 - 1.2 mg/dL - 0.3 0.3     Recent Labs  09/07/14 0230 09/08/14  0354 09/09/14 0245  WBC 6.7 5.5 5.6  HGB 9.5* 9.7* 9.4*  HCT 29.9* 30.6* 30.8*  MCV 90.3 91.6 92.2  PLT 178 148* 164    No results for input(s): TROPONINI in the last 72 hours.  Invalid input(s): CK, MB  Lab Results  Component Value Date   TSH 3.001 03/18/2013   No results for input(s): HGBA1C in the last 72 hours.   Recent Labs  09/06/14 1157  PROT 6.2*   No results for input(s): INR in the last 72 hours. BNP (last 3 results)  Recent Labs  09/09/14 0245  BNP 436.8*    ProBNP (last 3 results) No results for input(s): PROBNP in the last 8760 hours.   Lipid Panel     Component Value Date/Time   CHOL 114 10/13/2010 0401   TRIG 96 10/13/2010 0401   HDL 31* 10/13/2010 0401   CHOLHDL 3.7 10/13/2010 0401   VLDL 19 10/13/2010 0401   LDLCALC 64 10/13/2010 0401     Imaging:  Ct Angio Chest Pe W/cm &/or Wo Cm  09/07/2014   CLINICAL DATA:  Shortness of breath for several days. New chest pain with inspiration. Status post thoracentesis 09/06/2014. The  patient's pain is to the right of midline. No known injury. Subsequent encounter.  EXAM: CT ANGIOGRAPHY CHEST WITH CONTRAST  TECHNIQUE: Multidetector CT imaging of the chest was performed using the standard protocol during bolus administration of intravenous contrast. Multiplanar CT image reconstructions and MIPs were obtained to evaluate the vascular anatomy.  CONTRAST:  80 mL OMNIPAQUE IOHEXOL 350 MG/ML SOLN  COMPARISON:  CT chest 12/22/2013 and 07/28/2013. Single view of the chest 09/05/2013.  FINDINGS: No pulmonary embolus is identified. There is cardiomegaly. A very small left pleural effusion is noted. A moderate to moderately large right pleural effusion is seen. There is no pericardial effusion. As on prior examinations, there are some mildly prominent lymph nodes in the mediastinum and hila including a 1.4 cm pretracheal node on image 41 and 1.6 cm right hilar lymph node on image 50. No new or enlarging lymph nodes are identified in the axilla, hila or mediastinum. The patient is status post CABG.  Scattered ground-glass attenuation is present with interlobular septal thickening. There is no pneumothorax. Mild compressive atelectasis is more notable on the right. No consolidative process, nodule or mass is identified.  Visualized upper abdomen shows small amount of perihepatic ascites. Imaged intra-abdominal contents are otherwise unremarkable. No lytic or sclerotic bony lesion is seen.  Review of the MIP images confirms the above findings.  IMPRESSION: Negative for pulmonary embolus.  Findings most consistent with congestive heart failure with an associated moderate to moderately large right pleural effusion.  Very small left pleural effusion after thoracentesis. Negative for pneumothorax.  Small volume of perihepatic ascites.  Mildly enlarged mediastinal and hilar lymph nodes are chronic and likely reactive.   Electronically Signed   By: Inge Rise M.D.   On: 09/07/2014 09:55   Dg Chest Port 1v  Same Day  09/08/2014   CLINICAL DATA:  Pleural effusion. Shortness of breath worsening today.  EXAM: PORTABLE CHEST - 1 VIEW SAME DAY  COMPARISON:  Chest CT dated 09/07/2014 and chest x-ray dated 09/06/2014.  FINDINGS: Moderate cardiomegaly is unchanged. There is increased central pulmonary vascular congestion and bilateral interstitial edema suggesting congestive heart failure. Increasing opacity at the right lung base is most likely a combination of associated edema and atelectasis, with adjacent effusion. The small to moderate right-sided pleural effusion is  probably stable compared to the recent chest CT. No pneumothorax.  IMPRESSION: Cardiomegaly with central pulmonary vascular congestion and bilateral interstitial edema suggesting volume overload/CHF, worsened since chest x-ray of 09/06/2014.  Small to moderate right pleural effusion, probably stable.   Electronically Signed   By: Franki Cabot M.D.   On: 09/08/2014 12:35      Assessment/Plan:     Principal Problem:   Hypertensive urgency Active Problems:   Hypertension, uncontrolled   Diabetes mellitus type II, uncontrolled   Acute on chronic combined systolic and diastolic heart failure   Pleural effusion, right   Acute on chronic respiratory failure with hypoxia   Pressure ulcer   CHF (congestive heart failure)   Acute on chronic diastolic congestive heart failure   Chest pain  1. Hypertensive crisis:  BP still elevated was 160 - 180's on amlodipine 10 mg, coreg 25 mg bid, hydralzine 50 mg tid, and nitrates.  Will further titrate hydralazine to 75 mg tid.   2. Chest Pain/CAD: Negative troponin during this admission.  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 Placed on DAPT with ASA/plavix. Chest pain  with some features different than prior angina, ?pleuritic component;  No significant abnormalities on f/u ECG yesterday 3. Acute diastolic dysfunction; grade 3;  EF 55-60%;  4. Pleural effusion: s/p 1.5 liter thoracentesis; with pleuritic symptoms. CXR yesterday small to moderate residual R pleural effusion. 5. Intermediate VQ scan with negative for PE CT scan 6. Renal insufficiency;  Stable Cr 1.18 7. Anticoagulation; will change enoxaparon to VTE dose 8. Anemia  Doubt ischemic chest pain. Have titrated nitrates/ hydralazine yesterday and will slightly increase hydralazine today.  OK from cardiac standpoint to transfer to telemetry.  [name] Sine, MD, [facility] 09/09/2014, 8:59 AM

## 2014-09-09 NOTE — Progress Notes (Signed)
Patient continues to c/o chest pain that is painful to touch. MD at bedside to assess states that "he feels the chest pain is muscular skeletal pain. Will continue to monitor .

## 2014-09-09 NOTE — Progress Notes (Signed)
[facility] PROGRESS NOTE Interim History: 69-year-old female with past medical history of CABG in 2012, and stenting in 2015 presents to [facility] ED with chest pain and shortness of breath. She was started empirically on Rocephin and azithromycin, Lasix, Solu-Medrol and Nitropaste and she was transferred to [facility]    Assessment/Plan: Acute on chronic respiratory failure with hypoxia likely due to to acute on chronic systolic and diastolic heart failure with right-sided pleural effusion and severe pulmonary hypertension:  - Cardiology was consulted who recommended IV diuresis. - She was diuresed with IV Lasix and was negative about 5 L. Her Lasix was resumed to 40 mg twice a day. - She is now mildly short of breath, BNP is elevated, with a chest x-ray is compatible with interstitial edema, I will discuss with cardiology and switch her back to IV Lasix. Restrict IV fluids daily weights. - Echocardiogram on 09/04/2014 showed a normal EF with grade 3 diastolic heart failure and severe pulmonary hypertension with a PA pressure of 75 mmHg. - Her Moderate size right pleural effusion status post ultrasound-guided thoracocentesis with removal 1500 mL seems transudate of cytology is pending. - Repeat oxygen saturations with ambulation. - Due to severe pulmonary hypertension and recurrent right pleural effusion pulmonary is consulted, recommended to repeat echocardiogram, outpatient sleep study, VQ scan was done that showed Significant ventilatory, perfusion and radiographic abnormalities involving the RIGHT lower lobe.  Right pleural effusion: Status post ultrasound-guided thoracocentesis seems to be transudative, pulmonary consultant recommended tested above and follow-up with pulmonary as an outpatient.  Coronary artery sees/CABG: Continue aspirin Plavix Coreg centimeters.  Uncontrolled hypertension: Improve with diuresis.  Diarrhea: C. difficile PCR pending. - No further  diarrhea.  Code Status: full Family Communication: none  Disposition Plan: transfer to telemetry   Consultants:  Cardiology  Pulmonary  Procedures:  ECHo  CT angio  VQ scan  US guided thoracocentesis  CXR Lower ext doppler     Antibiotics:  none  HPI/Subjective: She relates she is mildly short of breath.  Objective: Filed Vitals:   09/09/14 0500 09/09/14 0621 09/09/14 0726 09/09/14 0802  BP:  183/29 162/35 162/35  Pulse:   55 56  Temp:   98 F (36.7 C)   TempSrc:   Oral   Resp:   18   Height:      Weight: 86.5 kg (190 lb 11.2 oz)     SpO2:   93%     Intake/Output Summary (Last 24 hours) at 09/09/14 0809 Last data filed at 09/09/14 0400  Gross per 24 hour  Intake    100 ml  Output    850 ml  Net   -750 ml   Filed Weights   09/07/14 0500 09/08/14 0328 09/09/14 0500  Weight: 86.7 kg (191 lb 2.2 oz) 86.2 kg (190 lb 0.6 oz) 86.5 kg (190 lb 11.2 oz)    Exam:  General: Alert, awake, oriented x3, in no acute distress.  HEENT: No bruits, no goiter.  Heart: Regular rate and rhythm, positive JVD. Lungs: Good air movement, bilateral crackles on the lower lobes Abdomen: Soft, nontender, nondistended, positive bowel sounds.  Neuro: Grossly intact, nonfocal.   Data Reviewed: Basic Metabolic Panel:  Recent Labs Lab 09/05/14 0254 09/06/14 0305 09/07/14 0230 09/08/14 0354 09/09/14 0245  NA 136 136 137 139 138  K 4.4 4.1 3.8 4.4 4.6  CL 100* 100* 103 104 102  CO2 _0 32 28  GLUCOSE 151* 150* 94 124* 88  BUN 48* 48*  38* 25* 27*  CREATININE 1.32* 1.43* 1.18* 1.05* 1.18*  CALCIUM 8.5* 8.6* 8.5* 8.6* 8.4*   Liver Function Tests:  Recent Labs Lab 09/06/14 1157  PROT 6.2*   No results for input(s): LIPASE, AMYLASE in the last 168 hours. No results for input(s): AMMONIA in the last 168 hours. CBC:  Recent Labs Lab 09/05/14 0254 09/06/14 0305 09/07/14 0230 09/08/14 0354 09/09/14 0245  WBC 7.2 6.7 6.7 5.5 5.6  HGB 9.2* 9.4* 9.5*  9.7* 9.4*  HCT 29.2* 29.8* 29.9* 30.6* 30.8*  MCV 90.7 92.5 90.3 91.6 92.2  PLT 170 164 178 148* 164   Cardiac Enzymes:  Recent Labs Lab 09/04/14 09/04/14 0510 09/04/14 1142  TROPONINI 0.03 0.04* 0.03   BNP (last 3 results)  Recent Labs  09/09/14 0245  BNP 436.8*    ProBNP (last 3 results) No results for input(s): PROBNP in the last 8760 hours.  CBG:  Recent Labs Lab 09/08/14 0743 09/08/14 0809 09/08/14 1148 09/08/14 1618 09/08/14 2114  GLUCAP 57* 97 131* 124* 142*    Recent Results (from the past 240 hour(s))  MRSA PCR Screening     Status: Abnormal   Collection Time: 09/03/14 10:59 PM  Result Value Ref Range Status   MRSA by PCR POSITIVE (A) NEGATIVE Final    Comment:        The GeneXpert MRSA Assay (FDA approved for NASAL specimens only), is one component of a comprehensive MRSA colonization surveillance program. It is not intended to diagnose MRSA infection nor to guide or monitor treatment for MRSA infections. RESULT CALLED TO, READ BACK BY AND VERIFIED WITH: H YANBU _0  09/04/14   Culture, body fluid-bottle     Status: None (Preliminary result)   Collection Time: 09/06/14 10:40 AM  Result Value Ref Range Status   Specimen Description FLUID RIGHT PLEURAL  Final   Special Requests NONE  Final   Culture NO GROWTH 2 DAYS  Final   Report Status PENDING  Incomplete  Gram stain     Status: None   Collection Time: 09/06/14 10:40 AM  Result Value Ref Range Status   Specimen Description FLUID RIGHT PLEURAL  Final   Special Requests BAA 10CCS  Final   Gram Stain   Final    FEW WBC PRESENT,BOTH PMN AND MONONUCLEAR NO ORGANISMS SEEN    Report Status 09/06/2014 FINAL  Final  C difficile quick scan w PCR reflex     Status: None   Collection Time: 09/08/14  8:45 PM  Result Value Ref Range Status   C Diff antigen NEGATIVE NEGATIVE Final   C Diff toxin NEGATIVE NEGATIVE Final   C Diff interpretation Negative for toxigenic C. difficile  Final      Studies: Ct Angio Chest Pe W/cm &/or Wo Cm  09/07/2014   CLINICAL DATA:  Shortness of breath for several days. New chest pain with inspiration. Status post thoracentesis 09/06/2014. The patient's pain is to the right of midline. No known injury. Subsequent encounter.  EXAM: CT ANGIOGRAPHY CHEST WITH CONTRAST  TECHNIQUE: Multidetector CT imaging of the chest was performed using the standard protocol during bolus administration of intravenous contrast. Multiplanar CT image reconstructions and MIPs were obtained to evaluate the vascular anatomy.  CONTRAST:  80 mL OMNIPAQUE IOHEXOL 350 MG/ML SOLN  COMPARISON:  CT chest 12/22/2013 and 07/28/2013. Single view of the chest 09/05/2013.  FINDINGS: No pulmonary embolus is identified. There is cardiomegaly. A very small left pleural effusion is noted. A moderate to moderately large right  pleural effusion is seen. There is no pericardial effusion. As on prior examinations, there are some mildly prominent lymph nodes in the mediastinum and hila including a 1.4 cm pretracheal node on image 41 and 1.6 cm right hilar lymph node on image 50. No new or enlarging lymph nodes are identified in the axilla, hila or mediastinum. The patient is status post CABG.  Scattered ground-glass attenuation is present with interlobular septal thickening. There is no pneumothorax. Mild compressive atelectasis is more notable on the right. No consolidative process, nodule or mass is identified.  Visualized upper abdomen shows small amount of perihepatic ascites. Imaged intra-abdominal contents are otherwise unremarkable. No lytic or sclerotic bony lesion is seen.  Review of the MIP images confirms the above findings.  IMPRESSION: Negative for pulmonary embolus.  Findings most consistent with congestive heart failure with an associated moderate to moderately large right pleural effusion.  Very small left pleural effusion after thoracentesis. Negative for pneumothorax.  Small volume of perihepatic  ascites.  Mildly enlarged mediastinal and hilar lymph nodes are chronic and likely reactive.   Electronically Signed   By: Inge Rise M.D.   On: 09/07/2014 09:55   Dg Chest Port 1v Same Day  09/08/2014   CLINICAL DATA:  Pleural effusion. Shortness of breath worsening today.  EXAM: PORTABLE CHEST - 1 VIEW SAME DAY  COMPARISON:  Chest CT dated 09/07/2014 and chest x-ray dated 09/06/2014.  FINDINGS: Moderate cardiomegaly is unchanged. There is increased central pulmonary vascular congestion and bilateral interstitial edema suggesting congestive heart failure. Increasing opacity at the right lung base is most likely a combination of associated edema and atelectasis, with adjacent effusion. The small to moderate right-sided pleural effusion is probably stable compared to the recent chest CT. No pneumothorax.  IMPRESSION: Cardiomegaly with central pulmonary vascular congestion and bilateral interstitial edema suggesting volume overload/CHF, worsened since chest x-ray of 09/06/2014.  Small to moderate right pleural effusion, probably stable.   Electronically Signed   By: Franki Cabot M.D.   On: 09/08/2014 12:35    Scheduled Meds: . amLODipine  10 mg Oral Daily  . antiseptic oral rinse  7 mL Mouth Rinse BID  . atorvastatin  40 mg Oral q1800  . carvedilol  25 mg Oral BID WC  . Chlorhexidine Gluconate Cloth  6 each Topical Q0600  . citalopram  20 mg Oral Daily  . clopidogrel  75 mg Oral Q breakfast  . enoxaparin (LOVENOX) injection  40 mg Subcutaneous Q24H  . furosemide  40 mg Intravenous BID  . gabapentin  300 mg Oral Daily  . hydrALAZINE  50 mg Oral 3 times per day  . insulin aspart  0-9 Units Subcutaneous TID WC  . insulin aspart  3 Units Subcutaneous TID WC  . insulin detemir  20 Units Subcutaneous QHS  . isosorbide mononitrate  90 mg Oral Daily  . multivitamin with minerals  1 tablet Oral Daily  . mupirocin ointment   Nasal BID  . potassium chloride  10 mEq Oral Daily  . sodium chloride  3  mL Intravenous Q12H  . Umeclidinium-Vilanterol   Inhalation Daily   Continuous Infusions:   Time Spent: 25 min   Charlynne Cousins  [facility] Pager [phone].  If 7PM-7AM, please contact night-coverage at [email or web address], password [facility] 09/09/2014, 8:09 AM  LOS: 6 days

## 2014-09-10 DIAGNOSIS — I5022 Chronic systolic (congestive) heart failure: Secondary | ICD-10-CM

## 2014-09-10 LAB — BASIC METABOLIC PANEL
Anion gap: 5 (ref 5–15)
BUN: 27 mg/dL — AB (ref 6–20)
CHLORIDE: 100 mmol/L — AB (ref 101–111)
CO2: 31 mmol/L (ref 22–32)
Calcium: 8.7 mg/dL — ABNORMAL LOW (ref 8.9–10.3)
Creatinine, Ser: 1.28 mg/dL — ABNORMAL HIGH (ref 0.44–1.00)
GFR calc Af Amer: 49 mL/min — ABNORMAL LOW (ref >60)
GFR calc non Af Amer: 42 mL/min — ABNORMAL LOW (ref >60)
GLUCOSE: 202 mg/dL — AB (ref 65–99)
POTASSIUM: 4.8 mmol/L (ref 3.5–5.1)
SODIUM: 136 mmol/L (ref 135–145)

## 2014-09-10 LAB — GLUCOSE, CAPILLARY
GLUCOSE-CAPILLARY: 186 mg/dL — AB (ref 65–99)
GLUCOSE-CAPILLARY: 94 mg/dL (ref 65–99)
Glucose-Capillary: 100 mg/dL — ABNORMAL HIGH (ref 65–99)
Glucose-Capillary: 106 mg/dL — ABNORMAL HIGH (ref 65–99)
Glucose-Capillary: 132 mg/dL — ABNORMAL HIGH (ref 65–99)

## 2014-09-10 NOTE — Progress Notes (Signed)
SATURATION QUALIFICATIONS: (This note is used to comply with regulatory documentation for home oxygen)  Patient Saturations on Room Air at Rest = 82%  Patient Saturations on Room Air while Ambulating = 76%  Patient Saturations on 3 Liters of oxygen while Ambulating = 90%  Please briefly explain why patient needs home oxygen: Patient desaturates quickly without oxygen even at rest. When ambulating, she desats into the 70% range. She became dizzy and lightheaded.

## 2014-09-10 NOTE — Progress Notes (Signed)
Name: [name] [name] MRN: [id number] DOB: July 02, 1945    ADMISSION DATE:  09/03/2014 CONSULTATION DATE:  09/08/14 REFERRING MD :  Dr. Broadus John / [facility]   CHIEF COMPLAINT:  PAH, Recurrent Pleural Effusion   BRIEF PATIENT DESCRIPTION: 70 y/o F admitted 8/25 with c/o chest pain and SOB.  Found to have a moderate R pleural effusion, R basilar atx, hypertensive urgency and decompensated diastolic dysfunction.    SIGNIFICANT EVENTS  8/25  Admit with c/o chest pain, SOB.  Work up c/w R effusion/atx, hypertenisve urgency and decompensated diastolic dysfunction 9/17  R Thoracentesis >> 1500 ml clear bloody fluid removed.  Transudative by LDH/Protein 9/01  PCCM called back to review VQ scan from 8/27 with mismatch / concerns for chronic thromboembolism  STUDIES:  8/26  ECHO >> EF 55-60%, no RWMA, grade 3 diastolic dysfunction, RV volume/pressure overload, PA Peak 75 8/27  VQ >> significant ventilatory/perfusion abnormality involving the RLL, perfusion is better than ventilation, intermediate for PE 8/29  CTA Chest >> neg for PE, findings c/w CHF with associated large R pleural effusion, small L effusion     SUBJECTIVE: Net negative 5600 since admit.  Pt reports ongoing chest pain - she describes as mid-sternal / left sided, heavy in nature, occasionally sharp/stabbing pain, worse with movement (getting up to bedside commode, sitting up) and also feels SOB.  She denies reflux symptoms.  Denies pain at rest.    VITAL SIGNS: Temp:  [97.9 F (36.6 C)-98.7 F (37.1 C)] 98.4 F (36.9 C) (09/01 0543) Pulse Rate:  [45-57] 57 (09/01 0543) Resp:  [18] 18 (09/01 0543) BP: (131-160)/(36-40) 142/40 mmHg (09/01 1059) SpO2:  [92 %-97 %] 94 % (09/01 0543) Weight:  [190 lb 6.4 oz (86.365 kg)-193 lb 12.8 oz (87.907 kg)] 190 lb 6.4 oz (86.365 kg) (09/01 0543)  PHYSICAL EXAMINATION: General:  Chronically ill adult female in NAD Neuro:  AAOx4, speech clear, MAE HEENT:  MM pink/moist, JVD + Cardiovascular:  s1s2  rrr, no m/r/g Lungs:  resp's even/non-labored on 4L O2, lungs bilaterally clear, slightly diminished on R Abdomen:  Obese/soft, bsx4 active  Musculoskeletal:  No acute deformities  Skin:  Warm/dry, LE with evidence of chronic venous stasis, reduced swelling, long scar on left wrist   Recent Labs Lab 09/08/14 0354 09/09/14 0245 09/10/14 1004  NA 139 138 136  K 4.4 4.6 4.8  CL 104 102 100*  CO2 32 28 31  BUN 25* 27* 27*  CREATININE 1.05* 1.18* 1.28*  GLUCOSE 124* 88 202*    Recent Labs Lab 09/07/14 0230 09/08/14 0354 09/09/14 0245  HGB 9.5* 9.7* 9.4*  HCT 29.9* 30.6* 30.8*  WBC 6.7 5.5 5.6  PLT 178 148* 164   No results found.  ASSESSMENT / PLAN:  Bilateral Pleural Effusions - moderate to large, R>L pleural effusion dating back to November of 2015 based on radiographic review. 8/27  VQ scan showed significant ventilatory / perfusion abnormality in RLL, perfusion better than ventilation.  This would be consistent with pleural effusion as it was larger on R than L with associated compressive atelectasis.  Patient is s/p R thoracentesis on 8/28 with 1.5 L transudative fluid removed.  Follow up CT on 8/29 showed moderate residual R effusion - doubt she was completely drained dry and she continues to be hypertensive.  CT was negative for PE.  Suspect effusions are secondary to decompensated CHF, diastolic dysfunction and hypertensive crisis given current work up.    Plan:  Negative balance as renal function /  BP permit Intermittent CXR, reassess effusions now with CXR Pleural cultures / cytology are negative to date Feel the perfusion / ventilation defect on prior VQ is related to the prior pleural effusion No wheezing on exam with good air movement Will need outpatient PFT's to define COPD  PAH - suspect degree of chronic PAH with smoking hx, concern for CTEPH with ventilation / perfusion mismatch on VQ   Plan: Will need to repeat ECHO once evolemic to accurately assess PA  pressures  Assess LE doppler to r/o DVT.  IF negative, will rule out chronic thromboembolism Consider undiagnosed OSA, auto immune disease for PAH outside of chronic lung disease  Chronic Hypoxic Respiratory Failure Former Tobacco Abuse   COPD - [facility]an [name]  Plan: O2 as needed to support saturations 90-95% ? If patient is compliant with O2 at home Will need ambulatory assessment prior to discharge on O2 for titration  Continue Anoro PRN Albuterol   Hypertensive Crisis  Grade III Diastolic Dysfunction  Combined Systolic & Diastolic CHF Chest Pain / CAD / PAD  Plan: Cardiology following Lasix 40 mg BID  Aggressive BP control DNR  Chest pain is reproducible with pressure - ? musculoskeletal pain  Renal Insufficiency - rising sr cr after admission, peak 1.43 from 1.17.  Improved 8/30 to 1.05  Plan: Lasix as above  Monitor sr cr / UOP  [name] Gens, NP-C [facility] Pgr: [phone] or if no answer [phone] 09/10/2014, 2:19 PM   STAFF NOTE: I, [name] Roof, MD FACP have personally reviewed patient's available data, including medical history, events of note, physical examination and test results as part of my evaluation. I have discussed with resident/NP and other care providers such as pharmacist, RN and RRT. In addition, I personally evaluated patient and elicited key findings of: revisited today, VQ noted pre thora and defect is ventilation, then thor done, so prob as int likley from this mismatch on pcxr, also low clinical suspcion for chronic thombo embolic dz however, does have unclear pulm htn 75 that is NOT accounted for by volume status alone, will doppler legs if neg the again unlikley chronic thombo embolic dz with combo above, will need pulm fxn testing to determine extent of copd? If at all, pcxr unimpressive and CT, cant account fully for pa pressures by smoking history, continued lasix to help reduce chance reoccurence  effusion that has come back already to some extent, she does snore, will also need sleep study   [name] [name]. [name] Mould, MD, [name] [name] Pgr: [facility] 09/10/2014 5:07 PM

## 2014-09-10 NOTE — Progress Notes (Addendum)
Patient Name: [name] [name] Date of Encounter: 09/10/2014     Principal Problem:   Hypertensive urgency Active Problems:   Hypertension, uncontrolled   Diabetes mellitus type II, uncontrolled   Acute on chronic combined systolic and diastolic heart failure   Pleural effusion, right   Acute on chronic respiratory failure with hypoxia   Pressure ulcer   CHF (congestive heart failure)   Acute on chronic diastolic congestive heart failure   Chest pain    SUBJECTIVE  On commode. Breathing a little better since back on IV lasix. Urinating a lot. Still with some stabbing CP.  CURRENT MEDS . amLODipine  10 mg Oral Daily  . antiseptic oral rinse  7 mL Mouth Rinse BID  . atorvastatin  40 mg Oral q1800  . carvedilol  25 mg Oral BID WC  . Chlorhexidine Gluconate Cloth  6 each Topical Q0600  . citalopram  20 mg Oral Daily  . clopidogrel  75 mg Oral Q breakfast  . enoxaparin (LOVENOX) injection  40 mg Subcutaneous Q24H  . furosemide  40 mg Intravenous BID  . gabapentin  300 mg Oral Daily  . gabapentin  300 mg Oral Daily  . hydrALAZINE  75 mg Oral 3 times per day  . insulin aspart  0-9 Units Subcutaneous TID WC  . insulin aspart  3 Units Subcutaneous TID WC  . insulin detemir  20 Units Subcutaneous QHS  . isosorbide mononitrate  90 mg Oral Daily  . multivitamin with minerals  1 tablet Oral Daily  . mupirocin ointment   Nasal BID  . potassium chloride  10 mEq Oral Daily  . sodium chloride  3 mL Intravenous Q12H  . Umeclidinium-Vilanterol   Inhalation Daily    OBJECTIVE  Filed Vitals:   09/09/14 1900 09/09/14 2138 09/10/14 0126 09/10/14 0543  BP: 131/38 152/40 151/36 160/37  Pulse: 45 51 52 57  Temp: 98.7 F (37.1 C) 98.2 F (36.8 C) 98.6 F (37 C) 98.4 F (36.9 C)  TempSrc: Oral Oral Oral Oral  Resp: _0 Height: 5' 6" (1.676 m)     Weight: 193 lb 12.8 oz (87.907 kg)   190 lb 6.4 oz (86.365 kg)  SpO2: 92% 94% 96% 94%    Intake/Output Summary (Last 24 hours)  at 09/10/14 0928 Last data filed at 09/10/14 0908  Gross per 24 hour  Intake   1340 ml  Output   1170 ml  Net    170 ml   Filed Weights   09/09/14 0500 09/09/14 1900 09/10/14 0543  Weight: 190 lb 11.2 oz (86.5 kg) 193 lb 12.8 oz (87.907 kg) 190 lb 6.4 oz (86.365 kg)    PHYSICAL EXAM General appearance: alert, cooperative and mild discomfort with deep breathing Neck: no adenopathy, no carotid bruit and supple, symmetrical, trachea midline Lungs: decreased BS R lower lung field; no wheezing Chest wall; nontender to palpation Heart: SB 1/6 sem , no gallop Abdomen: soft, non-tender; bowel sounds normal; no masses, no organomegaly Extremities: extremities normal, atraumatic, no cyanosis or edema Pulses: 2+ and symmetric Skin: small hemorrhagic lesion left forearm Neurologic: Grossly normal  Accessory Clinical Findings  CBC  Recent Labs  09/08/14 0354 09/09/14 0245  WBC 5.5 5.6  HGB 9.7* 9.4*  HCT 30.6* 30.8*  MCV 91.6 92.2  PLT 148* 483   Basic Metabolic Panel  Recent Labs  09/08/14 0354 09/09/14 0245  NA 139 138  K 4.4 4.6  CL 104 102  CO2 32  28  GLUCOSE 124* 88  BUN 25* 27*  CREATININE 1.05* 1.18*  CALCIUM 8.6* 8.4*    TELE  Sinus brady  Radiology/Studies  Dg Chest 1 View  09/06/2014   CLINICAL DATA:  Status post right-sided thoracentesis.  EXAM: CHEST  1 VIEW  COMPARISON:  1 day prior  FINDINGS: Patient rotated right. Prior median sternotomy. Midline trachea. Mild cardiomegaly. Resolution of right pleural effusion. No apical pneumothorax. Right pleural thickening inferiorly. Cannot exclude trace air inferior and laterally. No left-sided pleural effusion. Interstitial edema is decreased, mild. No lobar consolidation.  IMPRESSION: Status post right-sided thoracentesis with resolution of right pleural effusion. No apical pneumothorax. Cannot exclude trace inferior lateral right pleural air. Recommend attention on follow-up.  Improved interstitial edema.    Electronically Signed   By: Abigail Miyamoto M.D.   On: 09/06/2014 10:49   Dg Chest 2 View  09/05/2014   CLINICAL DATA:  69 year old female with history of hypoxia. History of pneumonia.  EXAM: CHEST  2 VIEW  COMPARISON:  Chest x-ray 09/04/2014.  FINDINGS: There is cephalization of the pulmonary vasculature and slight indistinctness of the interstitial markings suggestive of mild pulmonary edema. Moderate right pleural effusion slightly decreased compared to the prior exam. Opacity in the right lower lobe and may reflect atelectasis and/or airspace consolidation. Mild cardiomegaly. Upper mediastinal contours are within normal limits. Atherosclerosis in the thoracic aorta. Status post median sternotomy for CABG. Surgical clips project over the right upper quadrant of the abdomen, compatible with prior cholecystectomy.  IMPRESSION: 1. The appearance the chest suggests congestive heart failure. 2. In addition, there is a moderate-sized chronic right pleural effusion with probable passive atelectasis in the right lung base (underlying airspace consolidation is not excluded, but is not strongly favored at this time). 3. Atherosclerosis.   Electronically Signed   By: Vinnie Langton M.D.   On: 09/05/2014 14:11   Dg Chest 2 View  09/04/2014   CLINICAL DATA:  Shortness of breath, hypoxia, history of CHF, CABG, and respiratory failure, history of previous heavy tobacco use.  EXAM: CHEST  2 VIEW  COMPARISON:  Portable chest x-ray of September 03, 2014  FINDINGS: There is persistent moderate-sized right pleural effusion. Right basilar atelectasis or pneumonia is suspected. The interstitial markings of both lungs are increased. There is subsegmental atelectasis in the lingula. No left pleural effusion is observed. The cardiac silhouette is enlarged. There are post CABG changes. The bony thorax exhibits no acute abnormality.  IMPRESSION: CHF with mild pulmonary interstitial edema and moderate-sized right-sided pleural effusion.  Underlying atelectasis or pneumonia in the lower right lung is suspected. Continued follow-up radiographs are recommended to assure positive response to therapy.   Electronically Signed   By: David  Martinique M.D.   On: 09/04/2014 08:38   Ct Angio Chest Pe W/cm &/or Wo Cm  09/07/2014   CLINICAL DATA:  Shortness of breath for several days. New chest pain with inspiration. Status post thoracentesis 09/06/2014. The patient's pain is to the right of midline. No known injury. Subsequent encounter.  EXAM: CT ANGIOGRAPHY CHEST WITH CONTRAST  TECHNIQUE: Multidetector CT imaging of the chest was performed using the standard protocol during bolus administration of intravenous contrast. Multiplanar CT image reconstructions and MIPs were obtained to evaluate the vascular anatomy.  CONTRAST:  80 mL OMNIPAQUE IOHEXOL 350 MG/ML SOLN  COMPARISON:  CT chest 12/22/2013 and 07/28/2013. Single view of the chest 09/05/2013.  FINDINGS: No pulmonary embolus is identified. There is cardiomegaly. A very small left pleural effusion  is noted. A moderate to moderately large right pleural effusion is seen. There is no pericardial effusion. As on prior examinations, there are some mildly prominent lymph nodes in the mediastinum and hila including a 1.4 cm pretracheal node on image 41 and 1.6 cm right hilar lymph node on image 50. No new or enlarging lymph nodes are identified in the axilla, hila or mediastinum. The patient is status post CABG.  Scattered ground-glass attenuation is present with interlobular septal thickening. There is no pneumothorax. Mild compressive atelectasis is more notable on the right. No consolidative process, nodule or mass is identified.  Visualized upper abdomen shows small amount of perihepatic ascites. Imaged intra-abdominal contents are otherwise unremarkable. No lytic or sclerotic bony lesion is seen.  Review of the MIP images confirms the above findings.  IMPRESSION: Negative for pulmonary embolus.  Findings most  consistent with congestive heart failure with an associated moderate to moderately large right pleural effusion.  Very small left pleural effusion after thoracentesis. Negative for pneumothorax.  Small volume of perihepatic ascites.  Mildly enlarged mediastinal and hilar lymph nodes are chronic and likely reactive.   Electronically Signed   By: Inge Rise M.D.   On: 09/07/2014 09:55   Nm Pulmonary Perf And Vent  09/05/2014   CLINICAL DATA:  Hypoxia, chest pain, episode of vomiting, history benign essential hypertension, carotid artery disease, coronary artery disease, ischemic cardiomyopathy, MI, type II diabetes mellitus, CHF, former smoker  EXAM: NUCLEAR MEDICINE VENTILATION - PERFUSION LUNG SCAN  TECHNIQUE: Ventilation images were obtained in multiple projections using inhaled aerosol Tc-28mDTPA. Perfusion images were obtained in multiple projections after intravenous injection of Tc-933mAA.  RADIOPHARMACEUTICALS:  39.5 mCi Technetium-9945mPA aerosol inhalation and 5.4 mCi of Technetium-67m52m IV  COMPARISON:  None  Correlation: Chest radiograph 09/05/2014  FINDINGS: Ventilation: Absent ventilation RIGHT lower lobe. Diminished ventilation RIGHT middle lobe. Minimal diminished ventilation in lingula. Swallowed aerosol within stomach.  Perfusion: Matching diminished perfusion in RIGHT middle lobe. Perfusion is present in a portion of posterior aspect of the RIGHT lower lobe with absent perfusion in the remaining RIGHT lower lobe. Tiny subsegmental perfusion defect in lingula, matching.  Chest radiograph: Enlargement of cardiac silhouette with pulmonary vascular congestion, mild pulmonary edema, and moderate to large RIGHT pleural effusion with basilar atelectasis.  IMPRESSION: Significant ventilatory, perfusion and radiographic abnormalities involving the RIGHT lower lobe, though perfusion is slightly better than ventilation.  Additional matching ventilatory, perfusion, and radiographic abnormalities  in RIGHT middle lobe.  Findings represent an intermediate probability for pulmonary embolism.   Electronically Signed   By: MarkLavonia Dana.   On: 09/05/2014 13:55   Dg Chest Port 1v Same Day  09/08/2014   CLINICAL DATA:  Pleural effusion. Shortness of breath worsening today.  EXAM: PORTABLE CHEST - 1 VIEW SAME DAY  COMPARISON:  Chest CT dated 09/07/2014 and chest x-ray dated 09/06/2014.  FINDINGS: Moderate cardiomegaly is unchanged. There is increased central pulmonary vascular congestion and bilateral interstitial edema suggesting congestive heart failure. Increasing opacity at the right lung base is most likely a combination of associated edema and atelectasis, with adjacent effusion. The small to moderate right-sided pleural effusion is probably stable compared to the recent chest CT. No pneumothorax.  IMPRESSION: Cardiomegaly with central pulmonary vascular congestion and bilateral interstitial edema suggesting volume overload/CHF, worsened since chest x-ray of 09/06/2014.  Small to moderate right pleural effusion, probably stable.   Electronically Signed   By: StanFranki Cabot.   On: 09/08/2014 12:35  US Thoracentesis Asp Pleural Space W/img Guide  09/06/2014   CLINICAL DATA:  CHF, right pleural effusion, request for right thoracentesis  EXAM: ULTRASOUND GUIDED RIGHT THORACENTESIS  COMPARISON:  None.  PROCEDURE: An ultrasound guided thoracentesis was thoroughly discussed with the patient and questions answered. The benefits, risks, alternatives and complications were also discussed. The patient understands and wishes to proceed with the procedure. Written consent was obtained.  Ultrasound was performed to localize and mark an adequate pocket of fluid in the right chest. The area was then prepped and draped in the normal sterile fashion. 1% Lidocaine was used for local anesthesia. Under ultrasound guidance a Safe T Centesis catheter was introduced. Thoracentesis was performed. The catheter was removed  and a dressing applied.  COMPLICATIONS: None immediate.  FINDINGS: A total of approximately 1500 mls of clear bloody fluid was removed. A fluid sample wassent for laboratory analysis.  IMPRESSION: Successful ultrasound guided right thoracentesis yielding 1500 mls of pleural fluid.  Read by:  Gareth Eagle, PA-C   Electronically Signed   By: Jerilynn Mages.  Shick M.D.   On: 09/06/2014 10:42    ASSESSMENT AND PLAN  [name] [name] is a 69 y.o. female with a history of CAD s/p CABG in 2012 and DES to SVG--> D1 (03/2013), COPD on nocturnal 02, HLD, HTN, PVD, carotid artery disease s/p bilateral CEA, CVA and chronic combined systolic and diastolic CHF who was transferred from [facility] 09/03/14 for acute on chronic CHF, hypertensive urgency and elevated troponin.  1. Hypertensive crisis: BP better controlled today. She remains on amlodipine 10 mg, coreg 25 mg bid, hydralzine 75 mg tid, and imdur 73m.   2. Chest Pain/CAD: Negative troponin during this admission. She had NSTEMI/CABG x 4: L-LAD; S-PL; S-OM; S-DX in 11/2010; cardiac catheterization in 03/2013 revealed severe triple vessel CAD s/p 4V CABG with 4/4 patent grafts; high grade stenosis distal body of SVG to Diagonal, and underwent PTCA/DES x 1 distal body of SVG to Diagonal. Placed on DAPT with ASA/plavix. Chest pain with some features different than prior angina, ?pleuritic component; No significant abnormalities on f/u ECG   3. Acute on chronic diastolic CHF w/ right-sided pleural effusion and severe pulmonary HTN -- 2D ECHO 09/04/2014 showed a normal EF with G3DD and severe pulmonary hypertension with a PA pressure of 75 mmHg. -- She was diuresed with IV Lasix and was negative about 5 L. Her Lasix was resumed to 40 mg twice a day. She then became more SOB, had worsening CHF on CXR and elevated BNP; she was resumed on IV Lasix yesterday by IM. She is net neg 430 ML over night, weight down 3 lbs? and breathing better. Would continue IV diuresis  today. BMET pending to follow renal function  4. Pleural effusion: s/p 1.5 liter thoracentesis; with pleuritic symptoms. CXR small to moderate residual R pleural effusion. -- Seems transudative but cytology pending -- She will follow with pulmonary as an outpatient.  5. Intermediate VQ scan with CT scan negative for PE : Lovenox changed to VTE dose  6. Renal insufficiency; Stable Cr 1.18 09/09/14. BMET today pending  7. Anemia  Signed, TEileen StanfordPA-C  Pager [phone] Patient seen and examined. Agree with assessment and plan. Breathing better. I/O -[id number] BP better controlled.  Costochrondral tenderness with stabbing chest sensation, not ischemic. Continue diuresis.    TTroy Sine MD, FBellevue Hospital Center9/01/2014 3:22 PM

## 2014-09-10 NOTE — Progress Notes (Signed)
C-diff results are (-).  MRSA results remain (+).  After speaking with Infectious Disease, ok to discontinue enteric precautions, but continue Contact precautions r/t MRSA.

## 2014-09-10 NOTE — Plan of Care (Signed)
Problem: Phase I Progression Outcomes Goal: Dyspnea controlled at rest (HF) Outcome: Not Progressing Desaturates with any attempts at ambulation

## 2014-09-10 NOTE — Progress Notes (Addendum)
[facility] PROGRESS NOTE Interim History: 69 year old female with past medical history of CABG in 2012, and stenting in 2015 presents to [facility] ED with chest pain and shortness of breath. She was started empirically on Rocephin and azithromycin, Lasix, Solu-Medrol and Nitropaste and she was transferred to [facility]    Assessment/Plan: Acute on chronic respiratory failure with hypoxia likely due to to acute on chronic systolic and diastolic heart failure with right-sided pleural effusion and severe pulmonary hypertension:  - Cardiology was consulted who recommended IV diuresis. Cr cont to be stable. - Her Moderate size right pleural effusion status post ultrasound-guided seems transudate of cytology is pending. - Oxygen saturations with ambulation pending - She continues to complain of pleuritic chest pain worse with respiration. - Appreciate cardiology's and pulmonary sepsis assistance. - She was changed to prophylactic Lovenox by cardiology.  Right pleural effusion: Status post ultrasound-guided thoracocentesis seems to be transudative, pulmonary consultant recommended tested above and follow-up with pulmonary as an outpatient.  Coronary artery sees/CABG: Continue aspirin Plavix Coreg centimeters.  Uncontrolled hypertension: Improved amlodipine 10 mg, coreg 25 mg bid, hydralzine 75 mg tid, and imdur 29m.   Chest pain/coronary artery disease: Cardiac biomarkers were negative on this admission, cardiology was consulted to stitch and about a more pleuritic chest pain, she had no significant changes compared to previous EKGs.  Code Status: full Family Communication: none  Disposition Plan: transfer to telemetry   Consultants:  Cardiology  Pulmonary  Procedures:  ECHo  CT angio  VQ scan  UKoreaguided thoracocentesis  CXR Lower ext doppler     Antibiotics:  none  HPI/Subjective: She relates he continues to be short of breath with pleuritic chest  pain.  Objective: Filed Vitals:   09/09/14 2138 09/10/14 0126 09/10/14 0543 09/10/14 1059  BP: 152/40 151/36 160/37 142/40  Pulse: 51 52 57   Temp: 98.2 F (36.8 C) 98.6 F (37 C) 98.4 F (36.9 C)   TempSrc: Oral Oral Oral   Resp: _0 Height:      Weight:   86.365 kg (190 lb 6.4 oz)   SpO2: 94% 96% 94%     Intake/Output Summary (Last 24 hours) at 09/10/14 1204 Last data filed at 09/10/14 1012  Gross per 24 hour  Intake   1340 ml  Output   1070 ml  Net    270 ml   Filed Weights   09/09/14 0500 09/09/14 1900 09/10/14 0543  Weight: 86.5 kg (190 lb 11.2 oz) 87.907 kg (193 lb 12.8 oz) 86.365 kg (190 lb 6.4 oz)    Exam:  General: Alert, awake, oriented x3, in no acute distress.  HEENT: No bruits, no goiter.  Heart: Regular rate and rhythm, - JVD. Pain not reproducible by palpation. Lungs: Good air movement, bilateral crackles on the lower lobes Abdomen: Soft, nontender, nondistended, positive bowel sounds.  Neuro: Grossly intact, nonfocal.   Data Reviewed: Basic Metabolic Panel:  Recent Labs Lab 09/06/14 0305 09/07/14 0230 09/08/14 0354 09/09/14 0245 09/10/14 1004  NA 136 137 139 138 136  K 4.1 3.8 4.4 4.6 4.8  CL 100* 103 104 102 100*  CO2 29 30 32 28 31  GLUCOSE 150* 94 124* 88 202*  BUN 48* 38* 25* 27* 27*  CREATININE 1.43* 1.18* 1.05* 1.18* 1.28*  CALCIUM 8.6* 8.5* 8.6* 8.4* 8.7*   Liver Function Tests:  Recent Labs Lab 09/06/14 1157  PROT 6.2*   No results for input(s): LIPASE, AMYLASE in the last 168 hours.  No results for input(s): AMMONIA in the last 168 hours. CBC:  Recent Labs Lab 09/05/14 0254 09/06/14 0305 09/07/14 0230 09/08/14 0354 09/09/14 0245  WBC 7.2 6.7 6.7 5.5 5.6  HGB 9.2* 9.4* 9.5* 9.7* 9.4*  HCT 29.2* 29.8* 29.9* 30.6* 30.8*  MCV 90.7 92.5 90.3 91.6 92.2  PLT 170 164 178 148* 164   Cardiac Enzymes:  Recent Labs Lab 09/04/14 09/04/14 0510 09/04/14 1142  TROPONINI 0.03 0.04* 0.03   BNP (last 3  results)  Recent Labs  09/09/14 0245  BNP 436.8*    ProBNP (last 3 results) No results for input(s): PROBNP in the last 8760 hours.  CBG:  Recent Labs Lab 09/09/14 1614 09/09/14 2136 09/10/14 0340 09/10/14 0607 09/10/14 1122  GLUCAP 141* 114* 100* 106* 186*    Recent Results (from the past 240 hour(s))  MRSA PCR Screening     Status: Abnormal   Collection Time: 09/03/14 10:59 PM  Result Value Ref Range Status   MRSA by PCR POSITIVE (A) NEGATIVE Final    Comment:        The GeneXpert MRSA Assay (FDA approved for NASAL specimens only), is one component of a comprehensive MRSA colonization surveillance program. It is not intended to diagnose MRSA infection nor to guide or monitor treatment for MRSA infections. RESULT CALLED TO, READ BACK BY AND VERIFIED WITH: H YANBU _0  09/04/14   Culture, body fluid-bottle     Status: None (Preliminary result)   Collection Time: 09/06/14 10:40 AM  Result Value Ref Range Status   Specimen Description FLUID RIGHT PLEURAL  Final   Special Requests NONE  Final   Culture NO GROWTH 3 DAYS  Final   Report Status PENDING  Incomplete  Gram stain     Status: None   Collection Time: 09/06/14 10:40 AM  Result Value Ref Range Status   Specimen Description FLUID RIGHT PLEURAL  Final   Special Requests BAA 10CCS  Final   Gram Stain   Final    FEW WBC PRESENT,BOTH PMN AND MONONUCLEAR NO ORGANISMS SEEN    Report Status 09/06/2014 FINAL  Final  C difficile quick scan w PCR reflex     Status: None   Collection Time: 09/08/14  8:45 PM  Result Value Ref Range Status   C Diff antigen NEGATIVE NEGATIVE Final   C Diff toxin NEGATIVE NEGATIVE Final   C Diff interpretation Negative for toxigenic C. difficile  Final     Studies: No results found.  Scheduled Meds: . amLODipine  10 mg Oral Daily  . antiseptic oral rinse  7 mL Mouth Rinse BID  . atorvastatin  40 mg Oral q1800  . carvedilol  25 mg Oral BID WC  . Chlorhexidine Gluconate  Cloth  6 each Topical Q0600  . citalopram  20 mg Oral Daily  . clopidogrel  75 mg Oral Q breakfast  . enoxaparin (LOVENOX) injection  40 mg Subcutaneous Q24H  . furosemide  40 mg Intravenous BID  . gabapentin  300 mg Oral Daily  . gabapentin  300 mg Oral Daily  . hydrALAZINE  75 mg Oral 3 times per day  . insulin aspart  0-9 Units Subcutaneous TID WC  . insulin aspart  3 Units Subcutaneous TID WC  . insulin detemir  20 Units Subcutaneous QHS  . isosorbide mononitrate  90 mg Oral Daily  . multivitamin with minerals  1 tablet Oral Daily  . mupirocin ointment   Nasal BID  . potassium chloride  10 mEq  Oral Daily  . sodium chloride  3 mL Intravenous Q12H  . Umeclidinium-Vilanterol   Inhalation Daily   Continuous Infusions:   Time Spent: 25 min   Charlynne Cousins   [facility] Pager [phone].  If 7PM-7AM, please contact night-coverage at [email or web address], password [facility] 09/10/2014, 12:04 PM  LOS: 7 days

## 2014-09-11 ENCOUNTER — Inpatient Hospital Stay (HOSPITAL_COMMUNITY): Payer: Medicare Other

## 2014-09-11 DIAGNOSIS — I272 Pulmonary hypertension, unspecified: Secondary | ICD-10-CM | POA: Insufficient documentation

## 2014-09-11 DIAGNOSIS — R609 Edema, unspecified: Secondary | ICD-10-CM

## 2014-09-11 LAB — GLUCOSE, CAPILLARY
GLUCOSE-CAPILLARY: 94 mg/dL (ref 65–99)
Glucose-Capillary: 118 mg/dL — ABNORMAL HIGH (ref 65–99)
Glucose-Capillary: 167 mg/dL — ABNORMAL HIGH (ref 65–99)
Glucose-Capillary: 177 mg/dL — ABNORMAL HIGH (ref 65–99)

## 2014-09-11 LAB — BASIC METABOLIC PANEL
ANION GAP: 5 (ref 5–15)
BUN: 29 mg/dL — AB (ref 6–20)
CALCIUM: 8.6 mg/dL — AB (ref 8.9–10.3)
CO2: 31 mmol/L (ref 22–32)
CREATININE: 1.28 mg/dL — AB (ref 0.44–1.00)
Chloride: 99 mmol/L — ABNORMAL LOW (ref 101–111)
GFR calc Af Amer: 49 mL/min — ABNORMAL LOW (ref >60)
GFR, EST NON AFRICAN AMERICAN: 42 mL/min — AB (ref >60)
GLUCOSE: 148 mg/dL — AB (ref 65–99)
Potassium: 4.6 mmol/L (ref 3.5–5.1)
Sodium: 135 mmol/L (ref 135–145)

## 2014-09-11 LAB — CULTURE, BODY FLUID-BOTTLE: CULTURE: NO GROWTH

## 2014-09-11 LAB — CULTURE, BODY FLUID W GRAM STAIN -BOTTLE

## 2014-09-11 MED ORDER — ISOSORBIDE MONONITRATE ER 60 MG PO TB24
120.0000 mg | ORAL_TABLET | Freq: Every day | ORAL | Status: DC
  Administered 2014-09-12: 120 mg via ORAL
  Filled 2014-09-11: qty 2

## 2014-09-11 NOTE — Progress Notes (Signed)
Patient Name: [name] [name] Date of Encounter: 09/11/2014     Principal Problem:   Hypertensive urgency Active Problems:   Hypertension, uncontrolled   Diabetes mellitus type II, uncontrolled   Acute on chronic combined systolic and diastolic heart failure   Pleural effusion, right   Acute on chronic respiratory failure with hypoxia   Pressure ulcer   CHF (congestive heart failure)   Acute on chronic diastolic congestive heart failure   Chest pain    SUBJECTIVE  No complaints. Breathing better. Still with stabbing chest pain.   CURRENT MEDS . amLODipine  10 mg Oral Daily  . antiseptic oral rinse  7 mL Mouth Rinse BID  . atorvastatin  40 mg Oral q1800  . carvedilol  25 mg Oral BID WC  . Chlorhexidine Gluconate Cloth  6 each Topical Q0600  . citalopram  20 mg Oral Daily  . clopidogrel  75 mg Oral Q breakfast  . enoxaparin (LOVENOX) injection  40 mg Subcutaneous Q24H  . furosemide  40 mg Intravenous BID  . gabapentin  300 mg Oral Daily  . hydrALAZINE  75 mg Oral 3 times per day  . insulin aspart  0-9 Units Subcutaneous TID WC  . insulin aspart  3 Units Subcutaneous TID WC  . insulin detemir  20 Units Subcutaneous QHS  . isosorbide mononitrate  90 mg Oral Daily  . multivitamin with minerals  1 tablet Oral Daily  . mupirocin ointment   Nasal BID  . potassium chloride  10 mEq Oral Daily  . sodium chloride  3 mL Intravenous Q12H  . Umeclidinium-Vilanterol   Inhalation Daily    OBJECTIVE  Filed Vitals:   09/10/14 1059 09/10/14 1451 09/10/14 2100 09/11/14 0633  BP: 142/40 142/44 135/30 146/48  Pulse:   46 50  Temp:   98.2 F (36.8 C) 98.2 F (36.8 C)  TempSrc:   Oral Oral  Resp:   18 18  Height:      Weight:    189 lb 4.8 oz (85.866 kg)  SpO2:   92% 93%    Intake/Output Summary (Last 24 hours) at 09/11/14 0918 Last data filed at 09/11/14 0841  Gross per 24 hour  Intake    955 ml  Output    951 ml  Net      4 ml   Filed Weights   09/09/14 1900 09/10/14  0543 09/11/14 0633  Weight: 193 lb 12.8 oz (87.907 kg) 190 lb 6.4 oz (86.365 kg) 189 lb 4.8 oz (85.866 kg)    PHYSICAL EXAM General appearance: alert, cooperative and mild discomfort with deep breathing Neck: no adenopathy, no carotid bruit and supple, symmetrical, trachea midline Lungs: decreased BS R lower lung field; no wheezing Chest wall; nontender to palpation Heart: SB 1/6 sem , no gallop Abdomen: soft, non-tender; bowel sounds normal; no masses, no organomegaly Extremities: extremities normal, atraumatic, no cyanosis or edema Pulses: 2+ and symmetric Skin: small hemorrhagic lesion left forearm Neurologic: Grossly normal  Accessory Clinical Findings  CBC  Recent Labs  09/09/14 0245  WBC 5.6  HGB 9.4*  HCT 30.8*  MCV 92.2  PLT 388   Basic Metabolic Panel  Recent Labs  09/10/14 1004 09/11/14 0359  NA 136 135  K 4.8 4.6  CL 100* 99*  CO2 31 31  GLUCOSE 202* 148*  BUN 27* 29*  CREATININE 1.28* 1.28*  CALCIUM 8.7* 8.6*    TELE  Sinus brady w/ freq PVCs   Radiology/Studies  Dg Chest 1  View  09/06/2014   CLINICAL DATA:  Status post right-sided thoracentesis.  EXAM: CHEST  1 VIEW  COMPARISON:  1 day prior  FINDINGS: Patient rotated right. Prior median sternotomy. Midline trachea. Mild cardiomegaly. Resolution of right pleural effusion. No apical pneumothorax. Right pleural thickening inferiorly. Cannot exclude trace air inferior and laterally. No left-sided pleural effusion. Interstitial edema is decreased, mild. No lobar consolidation.  IMPRESSION: Status post right-sided thoracentesis with resolution of right pleural effusion. No apical pneumothorax. Cannot exclude trace inferior lateral right pleural air. Recommend attention on follow-up.  Improved interstitial edema.   Electronically Signed   By: Abigail Miyamoto M.D.   On: 09/06/2014 10:49   Dg Chest 2 View  09/05/2014   CLINICAL DATA:  69 year old female with history of hypoxia. History of pneumonia.  EXAM:  CHEST  2 VIEW  COMPARISON:  Chest x-ray 09/04/2014.  FINDINGS: There is cephalization of the pulmonary vasculature and slight indistinctness of the interstitial markings suggestive of mild pulmonary edema. Moderate right pleural effusion slightly decreased compared to the prior exam. Opacity in the right lower lobe and may reflect atelectasis and/or airspace consolidation. Mild cardiomegaly. Upper mediastinal contours are within normal limits. Atherosclerosis in the thoracic aorta. Status post median sternotomy for CABG. Surgical clips project over the right upper quadrant of the abdomen, compatible with prior cholecystectomy.  IMPRESSION: 1. The appearance the chest suggests congestive heart failure. 2. In addition, there is a moderate-sized chronic right pleural effusion with probable passive atelectasis in the right lung base (underlying airspace consolidation is not excluded, but is not strongly favored at this time). 3. Atherosclerosis.   Electronically Signed   By: Vinnie Langton M.D.   On: 09/05/2014 14:11   Dg Chest 2 View  09/04/2014   CLINICAL DATA:  Shortness of breath, hypoxia, history of CHF, CABG, and respiratory failure, history of previous heavy tobacco use.  EXAM: CHEST  2 VIEW  COMPARISON:  Portable chest x-ray of September 03, 2014  FINDINGS: There is persistent moderate-sized right pleural effusion. Right basilar atelectasis or pneumonia is suspected. The interstitial markings of both lungs are increased. There is subsegmental atelectasis in the lingula. No left pleural effusion is observed. The cardiac silhouette is enlarged. There are post CABG changes. The bony thorax exhibits no acute abnormality.  IMPRESSION: CHF with mild pulmonary interstitial edema and moderate-sized right-sided pleural effusion. Underlying atelectasis or pneumonia in the lower right lung is suspected. Continued follow-up radiographs are recommended to assure positive response to therapy.   Electronically Signed   By:  David  Martinique M.D.   On: 09/04/2014 08:38   Ct Angio Chest Pe W/cm &/or Wo Cm  09/07/2014   CLINICAL DATA:  Shortness of breath for several days. New chest pain with inspiration. Status post thoracentesis 09/06/2014. The patient's pain is to the right of midline. No known injury. Subsequent encounter.  EXAM: CT ANGIOGRAPHY CHEST WITH CONTRAST  TECHNIQUE: Multidetector CT imaging of the chest was performed using the standard protocol during bolus administration of intravenous contrast. Multiplanar CT image reconstructions and MIPs were obtained to evaluate the vascular anatomy.  CONTRAST:  80 mL OMNIPAQUE IOHEXOL 350 MG/ML SOLN  COMPARISON:  CT chest 12/22/2013 and 07/28/2013. Single view of the chest 09/05/2013.  FINDINGS: No pulmonary embolus is identified. There is cardiomegaly. A very small left pleural effusion is noted. A moderate to moderately large right pleural effusion is seen. There is no pericardial effusion. As on prior examinations, there are some mildly prominent lymph nodes in the  mediastinum and hila including a 1.4 cm pretracheal node on image 41 and 1.6 cm right hilar lymph node on image 50. No new or enlarging lymph nodes are identified in the axilla, hila or mediastinum. The patient is status post CABG.  Scattered ground-glass attenuation is present with interlobular septal thickening. There is no pneumothorax. Mild compressive atelectasis is more notable on the right. No consolidative process, nodule or mass is identified.  Visualized upper abdomen shows small amount of perihepatic ascites. Imaged intra-abdominal contents are otherwise unremarkable. No lytic or sclerotic bony lesion is seen.  Review of the MIP images confirms the above findings.  IMPRESSION: Negative for pulmonary embolus.  Findings most consistent with congestive heart failure with an associated moderate to moderately large right pleural effusion.  Very small left pleural effusion after thoracentesis. Negative for  pneumothorax.  Small volume of perihepatic ascites.  Mildly enlarged mediastinal and hilar lymph nodes are chronic and likely reactive.   Electronically Signed   By: Inge Rise M.D.   On: 09/07/2014 09:55   Nm Pulmonary Perf And Vent  09/05/2014   CLINICAL DATA:  Hypoxia, chest pain, episode of vomiting, history benign essential hypertension, carotid artery disease, coronary artery disease, ischemic cardiomyopathy, MI, type II diabetes mellitus, CHF, former smoker  EXAM: NUCLEAR MEDICINE VENTILATION - PERFUSION LUNG SCAN  TECHNIQUE: Ventilation images were obtained in multiple projections using inhaled aerosol Tc-24mDTPA. Perfusion images were obtained in multiple projections after intravenous injection of Tc-919mAA.  RADIOPHARMACEUTICALS:  39.5 mCi Technetium-9917mPA aerosol inhalation and 5.4 mCi of Technetium-59m49m IV  COMPARISON:  None  Correlation: Chest radiograph 09/05/2014  FINDINGS: Ventilation: Absent ventilation RIGHT lower lobe. Diminished ventilation RIGHT middle lobe. Minimal diminished ventilation in lingula. Swallowed aerosol within stomach.  Perfusion: Matching diminished perfusion in RIGHT middle lobe. Perfusion is present in a portion of posterior aspect of the RIGHT lower lobe with absent perfusion in the remaining RIGHT lower lobe. Tiny subsegmental perfusion defect in lingula, matching.  Chest radiograph: Enlargement of cardiac silhouette with pulmonary vascular congestion, mild pulmonary edema, and moderate to large RIGHT pleural effusion with basilar atelectasis.  IMPRESSION: Significant ventilatory, perfusion and radiographic abnormalities involving the RIGHT lower lobe, though perfusion is slightly better than ventilation.  Additional matching ventilatory, perfusion, and radiographic abnormalities in RIGHT middle lobe.  Findings represent an intermediate probability for pulmonary embolism.   Electronically Signed   By: MarkLavonia Dana.   On: 09/05/2014 13:55   Dg Chest Port  1v Same Day  09/08/2014   CLINICAL DATA:  Pleural effusion. Shortness of breath worsening today.  EXAM: PORTABLE CHEST - 1 VIEW SAME DAY  COMPARISON:  Chest CT dated 09/07/2014 and chest x-ray dated 09/06/2014.  FINDINGS: Moderate cardiomegaly is unchanged. There is increased central pulmonary vascular congestion and bilateral interstitial edema suggesting congestive heart failure. Increasing opacity at the right lung base is most likely a combination of associated edema and atelectasis, with adjacent effusion. The small to moderate right-sided pleural effusion is probably stable compared to the recent chest CT. No pneumothorax.  IMPRESSION: Cardiomegaly with central pulmonary vascular congestion and bilateral interstitial edema suggesting volume overload/CHF, worsened since chest x-ray of 09/06/2014.  Small to moderate right pleural effusion, probably stable.   Electronically Signed   By: StanFranki Cabot.   On: 09/08/2014 12:35   Us TKorearacentesis Asp Pleural Space W/img Guide  09/06/2014   CLINICAL DATA:  CHF, right pleural effusion, request for right thoracentesis  EXAM: ULTRASOUND GUIDED RIGHT THORACENTESIS  COMPARISON:  None.  PROCEDURE: An ultrasound guided thoracentesis was thoroughly discussed with the patient and questions answered. The benefits, risks, alternatives and complications were also discussed. The patient understands and wishes to proceed with the procedure. Written consent was obtained.  Ultrasound was performed to localize and mark an adequate pocket of fluid in the right chest. The area was then prepped and draped in the normal sterile fashion. 1% Lidocaine was used for local anesthesia. Under ultrasound guidance a Safe T Centesis catheter was introduced. Thoracentesis was performed. The catheter was removed and a dressing applied.  COMPLICATIONS: None immediate.  FINDINGS: A total of approximately 1500 mls of clear bloody fluid was removed. A fluid sample wassent for laboratory analysis.   IMPRESSION: Successful ultrasound guided right thoracentesis yielding 1500 mls of pleural fluid.  Read by:  Gareth Eagle, PA-C   Electronically Signed   By: Jerilynn Mages.  Shick M.D.   On: 09/06/2014 10:42    ASSESSMENT AND PLAN  ARIAH MOWER is a 68 y.o. female with a history of CAD s/p CABG in 2012 and DES to SVG--> D1 (03/2013), COPD on nocturnal 02, HLD, HTN, PVD, carotid artery disease s/p bilateral CEA, CVA and chronic combined systolic and diastolic CHF who was transferred from [facility] on 09/03/14 for acute on chronic CHF, hypertensive urgency and elevated troponin.  1. Hypertensive crisis: BP better controlled today. She remains on amlodipine 10 mg, coreg 25 mg bid, hydralzine 75 mg tid, and imdur 42m.   2. Chest Pain/CAD: Negative troponin during this admission. She had NSTEMI/CABG x 4: L-LAD; S-PL; S-OM; S-DX in 11/2010; cardiac catheterization in 03/2013 revealed severe triple vessel CAD s/p 4V CABG with 4/4 patent grafts; high grade stenosis distal body of SVG to Diagonal, and underwent PTCA/DES x 1 distal body of SVG to Diagonal. Placed on DAPT with ASA/plavix. -- Costochrondral tenderness with stabbing chest sensation, not ischemic  3. Acute on chronic diastolic CHF w/ right-sided pleural effusion and severe pulmonary HTN -- 2D ECHO 09/04/2014 showed a normal EF with G3DD and severe pulmonary hypertension with a PA pressure of 75 mmHg. -- She was diuresed with IV Lasix and was negative about 5L. Her Lasix was resumed to 40 mg twice a day. She then became more SOB, had worsening CHF on CXR and elevated BNP; she was resumed on IV Lasix 09/09/14 by IM. Weight down 4 lbs? and breathing better. Would continue IV diuresis today. Creat stable at 1.28. Possibly convert to PO tomorrow   4. Bilateral pleural effusions: s/p 1.5 liter thoracentesis; with pleuritic symptoms. CXR small to moderate residual R pleural effusion. -- Transudate by LDH/Protein  -- She is being followed by PCCM and  will follow with pulmonary as an outpatient. Their plain is to get her euvolemic as possible and reassess pulmonary and resps tatus and pulm htn in clinic in several weeks  5. Intermediate VQ scan with CT scan negative for PE : Lovenox changed to VTE dose  6. Renal insufficiency; Stable Cr 1.28.   7. Anemia  Signed, TEileen StanfordPA-C  Pager [phone]  Patient seen and examined. Agree with assessment and plan. BP remains controlled on current regimen for last several days. Transudative pleural  effusion. I/O -[id number] admission. Weight 198 ->189. Cr stable at 1.28. Chest pain improved.  Cardiac stable; will sign off   TTroy Sine MD, FSelect Specialty Hospital - [facility]9/02/2014 1:35 PM    09/11/2014 9:18 AM

## 2014-09-11 NOTE — Evaluation (Signed)
Physical Therapy Evaluation Patient Details Name: [name] [name] MRN: [id number] DOB: 1945-06-12 Today's Date: 09/11/2014   History of Present Illness  69 y.o. female with a history of CAD s/p CABG in 2012 and DES to SVG--> D1 (03/2013), COPD on nocturnal 02, HLD, HTN, PVD, carotid artery disease s/p bilateral CEA, CVA and chronic combined systolic and diastolic CHF who was transferred from [facility] on 09/03/14 for acute on chronic CHF, hypertensive urgency and elevated troponin     Clinical Impression  Pt admitted with above diagnosis. Pt currently with functional limitations due to the deficits listed below (see PT Problem List). Currently pt is functioning at S/steady A level with RW on evaluation. Pt will benefit from skilled PT to increase their independence and safety with mobility and address balance to allow discharge home with family support.  Pt on 3L O2 via [place]; after gait O2 dropped to 85% (92% at rest and HR = 49 bpm) but increased to 93% (HR = 55 bpm) with pursed lip breathing within 30-60 seconds.      Follow Up Recommendations Home health PT;Supervision - Intermittent    Equipment Recommendations  None recommended by PT    Recommendations for Other Services       Precautions / Restrictions Precautions Precautions: Fall (chronic O2 user) Precaution Comments: 3L O2 Restrictions Weight Bearing Restrictions: No      Mobility  Bed Mobility Overal bed mobility: Needs Assistance Bed Mobility: Supine to Sit     Supine to sit: Min guard     General bed mobility comments: Pt cannot tolerate lying flat so kept HOB slightly elevated (she uses pillows at home). Steady A initially due to lightheaded when first sitting up but resolved within a few seconds  Transfers Overall transfer level: Needs assistance Equipment used: Rolling walker (2 wheeled) Transfers: Sit to/from Stand Sit to Stand: Supervision         General transfer comment: cues for hand placement  and technique  Ambulation/Gait Ambulation/Gait assistance: Supervision Ambulation Distance (Feet): 50 Feet Assistive device: Rolling walker (2 wheeled) Gait Pattern/deviations: Step-through pattern;Decreased stride length;Trunk flexed     General Gait Details: Cues for upright posture; no LOB noted  Stairs            Wheelchair Mobility    Modified Rankin (Stroke Patients Only)       Balance Overall balance assessment: History of Falls;Needs assistance Sitting-balance support: No upper extremity supported;Feet supported Sitting balance-Leahy Scale: Good     Standing balance support: Single extremity supported;Bilateral upper extremity supported;During functional activity Standing balance-Leahy Scale: Fair                               Pertinent Vitals/Pain Pain Assessment: Faces Faces Pain Scale: Hurts a little bit Pain Location: chest Pain Intervention(s): Limited activity within patient's tolerance;Monitored during session;Repositioned    Home Living Family/patient expects to be discharged to:: Private residence Living Arrangements: Children (son, daughter in law and grandchild) Available Help at Discharge: Family;Available PRN/intermittently (Pt reports family works so there are times she is alone) Type of Home: House Home Access: Stairs to enter Entrance Stairs-Rails: None Entrance Stairs-Number of Steps: 1 + 1 Home Layout: One level Home Equipment: [place] - 2 wheels;Walker - 4 wheels;Cane - single point;Shower seat;Bedside commode      Prior Function Level of Independence: Independent with assistive device(s)         Comments: Pt reports she uses  cane or RW depending on how she is feeling     Hand Dominance   Dominant Hand: Right    Extremity/Trunk Assessment   Upper Extremity Assessment: Overall WFL for tasks assessed           Lower Extremity Assessment: Generalized weakness      Cervical / Trunk Assessment: Kyphotic   Communication   Communication: No difficulties  Cognition Arousal/Alertness: Awake/alert Behavior During Therapy: WFL for tasks assessed/performed Overall Cognitive Status: Within Functional Limits for tasks assessed                      General Comments General comments (skin integrity, edema, etc.): Educated on pursed lip breathing, fall risk and safety recommendations for home. Pt reports she keeps a phone with her when she is alone at home incase of fall. Reports h/o at least 3 falls in the past    Exercises        Assessment/Plan    PT Assessment Patient needs continued PT services  PT Diagnosis Difficulty walking;Acute pain;Generalized weakness   PT Problem List Decreased strength;Decreased activity tolerance;Decreased balance;Decreased mobility;Decreased knowledge of use of DME;Cardiopulmonary status limiting activity;Pain  PT Treatment Interventions DME instruction;Gait training;Stair training;Functional mobility training;Therapeutic activities;Therapeutic exercise;Balance training;Neuromuscular re-education;Patient/family education   PT Goals (Current goals can be found in the Care Plan section) Acute Rehab PT Goals Patient Stated Goal: get stronger PT Goal Formulation: With patient Time For Goal Achievement: 09/25/14 Potential to Achieve Goals: Good    Frequency Min 3X/week   Barriers to discharge   recommend initial 24/7 S for safety due to increased need for O2 and h/o falls    Co-evaluation               End of Session Equipment Utilized During Treatment: Oxygen Activity Tolerance: Patient tolerated treatment well Patient left: in bed;with call bell/phone within reach;with nursing/sitter in room Nurse Communication: Mobility status;Other (comment) (O2 sats with gait)         Time: 9311-2162 PT Time Calculation (min) (ACUTE ONLY): 25 min   Charges:   PT Evaluation $Initial PT Evaluation Tier I: 1 Procedure PT Treatments $Gait Training:  8-22 mins   PT G Codes:        Canary Brim Ivory Broad, PT, DPT Pager #: [phone]  09/11/2014, 3:28 PM

## 2014-09-11 NOTE — Care Management Note (Signed)
Case Management Note  Patient Details  Name: [name] [name] MRN: [id number] Date of Birth: 01-21-1945  Subjective/Objective:     Admitted with HTN Urgency               Action/Plan: Patient lives at home with her son and daughter in law, has home 02 through [place] and [facility]. Has private insurance with Medicare and has no problems getting her medication. Pharmacy of choice is [facility]. Patient has a walker and can at home for mobility.  Expected Discharge Date:    possibly 09/12/2014              Expected Discharge Plan:  [place]  Discharge planning Services  CM Consult  Choice offered to:  Patient  HH Arranged:  RN [facility] Agency:  [place]  Status of Service:  In process, will continue to follow  Medicare Important Message Given:  [facility] notification given  Sherrilyn Rist [phone] 09/11/2014, 10:55 AM

## 2014-09-11 NOTE — Progress Notes (Signed)
[facility] PROGRESS NOTE Interim History: 69 year old female with past medical history of CABG in 2012, and stenting in 2015 presents to [facility] ED with chest pain and shortness of breath. She was started empirically on Rocephin and azithromycin, Lasix, Solu-Medrol and Nitropaste and she was transferred to [facility]    Assessment/Plan: Acute on chronic respiratory failure with hypoxia likely due to to acute on chronic systolic and diastolic heart failure with right-sided pleural effusion and severe pulmonary hypertension:  - Cardiology was consulted cont  IV diuresis. Cr cont to be stable. Increase bidil. If needed can further titrate hydralazine, ARB to be resume as an outpatient once Cr stabilizes. - Her Moderate size right pleural effusion status post ultrasound-guided seems transudate of cytology showed no malignant cells. - Oxygen saturations with ambulation pending - Appreciate cardiology's and pulmonary sepsis assistance. - She was changed to prophylactic Lovenox by cardiology.  Right pleural effusion: Status post ultrasound-guided thoracocentesis seems to be transudative, pulmonary consultant recommended tested above and follow-up with pulmonary as an outpatient.  Coronary artery sees/CABG: Continue aspirin Plavix Coreg centimeters.  Uncontrolled hypertension: Cont antihypertensive medication titration, on amlodipine 10 mg, coreg 25 mg bid, hydralzine 75 mg tid, and imdur 182m   Chest pain/coronary artery disease: Cardiac biomarkers were negative on this admission, cardiology was consulted to stitch and about a more pleuritic chest pain, she had no significant changes compared to previous EKGs.  Code Status: full Family Communication: none  Disposition Plan: home in am.   Consultants:  Cardiology  Pulmonary  Procedures:  ECHo  CT angio  VQ scan  UKoreaguided thoracocentesis  CXR Lower ext doppler     Antibiotics:  none  HPI/Subjective: CP  improved, SOB improved.  Objective: Filed Vitals:   09/10/14 1451 09/10/14 2100 09/11/14 0633 09/11/14 0938  BP: 142/44 135/30 146/48 144/32  Pulse:  46 50 49  Temp:  98.2 F (36.8 C) 98.2 F (36.8 C)   TempSrc:  Oral Oral   Resp:  18 18   Height:      Weight:   85.866 kg (189 lb 4.8 oz)   SpO2:  92% 93%     Intake/Output Summary (Last 24 hours) at 09/11/14 1226 Last data filed at 09/11/14 1049  Gross per 24 hour  Intake    900 ml  Output    902 ml  Net     -2 ml   Filed Weights   09/09/14 1900 09/10/14 0543 09/11/14 0633  Weight: 87.907 kg (193 lb 12.8 oz) 86.365 kg (190 lb 6.4 oz) 85.866 kg (189 lb 4.8 oz)    Exam:  General: Alert, awake, oriented x3, in no acute distress.  HEENT: No bruits, no goiter.  Heart: Regular rate and rhythm,  Pain not reproducible by palpation. Lungs: Good air movement, bilateral crackles on the lower lobes Abdomen: Soft, nontender, nondistended, positive bowel sounds.  Neuro: Grossly intact, nonfocal.   Data Reviewed: Basic Metabolic Panel:  Recent Labs Lab 09/07/14 0230 09/08/14 0354 09/09/14 0245 09/10/14 1004 09/11/14 0359  NA 137 139 138 136 135  K 3.8 4.4 4.6 4.8 4.6  CL 103 104 102 100* 99*  CO2 30 32 _0 GLUCOSE 94 124* 88 202* 148*  BUN 38* 25* 27* 27* 29*  CREATININE 1.18* 1.05* 1.18* 1.28* 1.28*  CALCIUM 8.5* 8.6* 8.4* 8.7* 8.6*   Liver Function Tests:  Recent Labs Lab 09/06/14 1157  PROT 6.2*   No results for input(s): LIPASE, AMYLASE in the last  168 hours. No results for input(s): AMMONIA in the last 168 hours. CBC:  Recent Labs Lab 09/05/14 0254 09/06/14 0305 09/07/14 0230 09/08/14 0354 09/09/14 0245  WBC 7.2 6.7 6.7 5.5 5.6  HGB 9.2* 9.4* 9.5* 9.7* 9.4*  HCT 29.2* 29.8* 29.9* 30.6* 30.8*  MCV 90.7 92.5 90.3 91.6 92.2  PLT 170 164 178 148* 164   Cardiac Enzymes: No results for input(s): CKTOTAL, CKMB, CKMBINDEX, TROPONINI in the last 168 hours. BNP (last 3 results)  Recent Labs   09/09/14 0245  BNP 436.8*    ProBNP (last 3 results) No results for input(s): PROBNP in the last 8760 hours.  CBG:  Recent Labs Lab 09/10/14 1122 09/10/14 1618 09/10/14 2047 09/11/14 0619 09/11/14 1144  GLUCAP 186* 132* 94 118* 167*    Recent Results (from the past 240 hour(s))  MRSA PCR Screening     Status: Abnormal   Collection Time: 09/03/14 10:59 PM  Result Value Ref Range Status   MRSA by PCR POSITIVE (A) NEGATIVE Final    Comment:        The GeneXpert MRSA Assay (FDA approved for NASAL specimens only), is one component of a comprehensive MRSA colonization surveillance program. It is not intended to diagnose MRSA infection nor to guide or monitor treatment for MRSA infections. RESULT CALLED TO, READ BACK BY AND VERIFIED WITH: H YANBU _0  09/04/14   Culture, body fluid-bottle     Status: None (Preliminary result)   Collection Time: 09/06/14 10:40 AM  Result Value Ref Range Status   Specimen Description FLUID RIGHT PLEURAL  Final   Special Requests NONE  Final   Culture NO GROWTH 4 DAYS  Final   Report Status PENDING  Incomplete  Gram stain     Status: None   Collection Time: 09/06/14 10:40 AM  Result Value Ref Range Status   Specimen Description FLUID RIGHT PLEURAL  Final   Special Requests BAA 10CCS  Final   Gram Stain   Final    FEW WBC PRESENT,BOTH PMN AND MONONUCLEAR NO ORGANISMS SEEN    Report Status 09/06/2014 FINAL  Final  C difficile quick scan w PCR reflex     Status: None   Collection Time: 09/08/14  8:45 PM  Result Value Ref Range Status   C Diff antigen NEGATIVE NEGATIVE Final   C Diff toxin NEGATIVE NEGATIVE Final   C Diff interpretation Negative for toxigenic C. difficile  Final     Studies: No results found.  Scheduled Meds: . amLODipine  10 mg Oral Daily  . antiseptic oral rinse  7 mL Mouth Rinse BID  . atorvastatin  40 mg Oral q1800  . carvedilol  25 mg Oral BID WC  . Chlorhexidine Gluconate Cloth  6 each Topical Q0600  .  citalopram  20 mg Oral Daily  . clopidogrel  75 mg Oral Q breakfast  . enoxaparin (LOVENOX) injection  40 mg Subcutaneous Q24H  . furosemide  40 mg Intravenous BID  . gabapentin  300 mg Oral Daily  . hydrALAZINE  75 mg Oral 3 times per day  . insulin aspart  0-9 Units Subcutaneous TID WC  . insulin aspart  3 Units Subcutaneous TID WC  . insulin detemir  20 Units Subcutaneous QHS  . isosorbide mononitrate  90 mg Oral Daily  . multivitamin with minerals  1 tablet Oral Daily  . mupirocin ointment   Nasal BID  . potassium chloride  10 mEq Oral Daily  . sodium chloride  3 mL  Intravenous Q12H  . Umeclidinium-Vilanterol   Inhalation Daily   Continuous Infusions:   Time Spent: 25 min   Charlynne Cousins   [facility] Pager [phone].  If 7PM-7AM, please contact night-coverage at [email or web address], password [facility] 09/11/2014, 12:26 PM  LOS: 8 days

## 2014-09-11 NOTE — Progress Notes (Signed)
VASCULAR LAB PRELIMINARY  PRELIMINARY  PRELIMINARY  PRELIMINARY  Bilateral lower extremity venous duplex  completed.    Preliminary report:  Bilateral:  No evidence of DVT, superficial thrombosis, or Baker's Cyst.    Damarion Mendizabal, RVT 09/11/2014, 4:14 PM

## 2014-09-11 NOTE — Progress Notes (Addendum)
Name: [name] [name] MRN: [id number] DOB: 1945/10/12    ADMISSION DATE:  09/03/2014 CONSULTATION DATE:  09/08/14 REFERRING MD :  Dr. Broadus John / [facility]   CHIEF COMPLAINT:  PAH, Recurrent Pleural Effusion   BRIEF PATIENT DESCRIPTION: 69 y/o F admitted 8/25 with c/o chest pain and SOB.  Found to have a moderate R pleural effusion, R basilar atx, hypertensive urgency and decompensated diastolic dysfunction.    SIGNIFICANT EVENTS  8/25  Admit with c/o chest pain, SOB.  Work up c/w R effusion/atx, hypertenisve urgency and decompensated diastolic dysfunction 4/91  R Thoracentesis >> 1500 ml clear bloody fluid removed.  Transudative by LDH/Protein 9/01  PCCM called back to review VQ scan from 8/27 with mismatch / concerns for chronic thromboembolism  STUDIES:  8/26  ECHO >> EF 55-60%, no RWMA, grade 3 diastolic dysfunction, RV volume/pressure overload, PA Peak 75 8/27  VQ >> significant ventilatory/perfusion abnormality involving the RLL, perfusion is better than ventilation, intermediate for PE 8/29  CTA Chest >> neg for PE, findings c/w CHF with associated large R pleural effusion, small L effusion  09/10/14: Net negative 5600 since admit.  Pt reports ongoing chest pain - she describes as mid-sternal / left sided, heavy in nature, occasionally sharp/stabbing pain, worse with movement (getting up to bedside commode, sitting up) and also feels SOB.  She denies reflux symptoms.  Denies pain at rest.     SUBJECTIVE/OVERNIGHT/INTERVAL HX  09/11/14: improved iureses -2.8L negative since 09/05/14. Says feels better with dyspnea but not at baseline. . RN notes indicate desaturation on RA but patient says she is on o2 at home -pre-admit baseline. No oher issues.   - key labs 09/05/16 thora - transudate, cytology non-diagnostic   - echo 09/04/14 - PASP 75 with RV dysfn and Gr3 diast dysfn -> 09/09/14 - Normal RV, no report on diast dysfn or PASP (assumption is improved)   - [place] DUoplex LE 09/05/14 - negative for  DVT  VITAL SIGNS: Temp:  [98.2 F (36.8 C)] 98.2 F (36.8 C) (09/02 7915) Pulse Rate:  [46-50] 50 (09/02 0633) Resp:  [18] 18 (09/02 0633) BP: (135-146)/(30-48) 146/48 mmHg (09/02 0633) SpO2:  [92 %-93 %] 93 % (09/02 0633) Weight:  [85.866 kg (189 lb 4.8 oz)] 85.866 kg (189 lb 4.8 oz) (09/02 0569)  PHYSICAL EXAMINATION: General:  Chronically ill adult female in NAD Neuro:  AAOx4, speech clear, MAE HEENT:  MM pink/moist, JVD + Cardiovascular:  s1s2 rrr, no m/r/g Lungs:  resp's even/non-labored on 4L O2, lungs bilaterally clear, slightly diminished on R Abdomen:  Obese/soft, bsx4 active  Musculoskeletal:  No acute deformities  Skin:  Warm/dry, LE with evidence of chronic venous stasis, reduced swelling, long scar on left wrist    PULMONARY No results for input(s): PHART, PCO2ART, PO2ART, HCO3, TCO2, O2SAT in the last 168 hours.  Invalid input(s): PCO2, PO2  CBC  Recent Labs Lab 09/07/14 0230 09/08/14 0354 09/09/14 0245  HGB 9.5* 9.7* 9.4*  HCT 29.9* 30.6* 30.8*  WBC 6.7 5.5 5.6  PLT 178 148* 164    COAGULATION No results for input(s): INR in the last 168 hours.  CARDIAC   Recent Labs Lab 09/04/14 1142  TROPONINI 0.03   No results for input(s): PROBNP in the last 168 hours.   CHEMISTRY  Recent Labs Lab 09/07/14 0230 09/08/14 0354 09/09/14 0245 09/10/14 1004 09/11/14 0359  NA 137 139 138 136 135  K 3.8 4.4 4.6 4.8 4.6  CL 103 104 102 100* 99*  CO2 30 32 _0 GLUCOSE 94 124* 88 202* 148*  BUN 38* 25* 27* 27* 29*  CREATININE 1.18* 1.05* 1.18* 1.28* 1.28*  CALCIUM 8.5* 8.6* 8.4* 8.7* 8.6*   Estimated Creatinine Clearance: 47.1 mL/min (by C-G formula based on Cr of 1.28).   LIVER  Recent Labs Lab 09/06/14 1157  PROT 6.2*     INFECTIOUS No results for input(s): LATICACIDVEN, PROCALCITON in the last 168 hours.   ENDOCRINE CBG (last 3)   Recent Labs  09/10/14 1618 09/10/14 2047 09/11/14 0619  GLUCAP 132* 94 118*          IMAGING x48h  - image(s) personally visualized  -   highlighted in bold No results found. CT - emphysema and effusion 09/07/14 of chest -personally visualized    ASSESSMENT / PLAN: #Baseline  - Chronic Hypoxic Respiratory Failure due to copd and o2 dependent  #Current - Acute on chronic hypoxemic resp failure  - Bilateral Pleural Effusions  - transudative in settting of Hypertensive Crisis and Grade III Diastolic Dysfunction  And Acute  Diastolic CHF   - Concern for RV dysfn and Pulm HTN on echo  09/04/14 but appears improved significantly on followup 09/09/14   Overall pic c/w Acute diast CHF and is improved No evidence of  DVT/CTEPH  PLAN   - get her euvolemic as possible and reassess pulmonary and resps tatus and pulm htn in clinic in several weeks   - diurese and cardiac meds per cards/[facility] - o2 and nebs for copd    D/w Dr Aileen Fass of [facility]  PCCM will sign off  FU with Dr Tera Partridge [facility] - 10/12/14 [address] [place], [place]   Future Appointments Date Time Provider [place]  09/11/2014 3:00 PM [facility] VASC US 4-[facility]A [facility]  10/16/2014 3:00 PM Javier Glazier, MD LBPU-PULCARE None  11/11/2014 9:30 AM MC-CV HS VASC 1 MC-HCVI [facility]  11/11/2014 10:30 AM Angelia Mould, MD [facility] V[facility]      Dr. Brand Males, M.D., F.C.C.P Pulmonary and Critical Care Medicine Staff Physician [facility] Pager: [phone], If no answer or between  15:00h - 7:00h: call [phone]  09/11/2014 9:31 AM

## 2014-09-12 DIAGNOSIS — I270 Primary pulmonary hypertension: Secondary | ICD-10-CM

## 2014-09-12 LAB — GLUCOSE, CAPILLARY
Glucose-Capillary: 145 mg/dL — ABNORMAL HIGH (ref 65–99)
Glucose-Capillary: 110 mg/dL — ABNORMAL HIGH (ref 65–99)
Glucose-Capillary: 193 mg/dL — ABNORMAL HIGH (ref 65–99)

## 2014-09-12 LAB — BASIC METABOLIC PANEL
ANION GAP: 6 (ref 5–15)
BUN: 29 mg/dL — AB (ref 6–20)
CALCIUM: 8.7 mg/dL — AB (ref 8.9–10.3)
CO2: 32 mmol/L (ref 22–32)
CREATININE: 1.23 mg/dL — AB (ref 0.44–1.00)
Chloride: 97 mmol/L — ABNORMAL LOW (ref 101–111)
GFR calc Af Amer: 51 mL/min — ABNORMAL LOW (ref >60)
GFR, EST NON AFRICAN AMERICAN: 44 mL/min — AB (ref >60)
Glucose, Bld: 133 mg/dL — ABNORMAL HIGH (ref 65–99)
Potassium: 4.1 mmol/L (ref 3.5–5.1)
Sodium: 135 mmol/L (ref 135–145)

## 2014-09-12 MED ORDER — HYDROCODONE-ACETAMINOPHEN 5-325 MG PO TABS: 1.0000 | ORAL_TABLET | Freq: Four times a day (QID) | ORAL | Status: DC | PRN

## 2014-09-12 MED ORDER — LISINOPRIL 20 MG PO TABS
20.0000 mg | ORAL_TABLET | Freq: Every day | ORAL | Status: DC
  Administered 2014-09-12: 20 mg via ORAL
  Filled 2014-09-12: qty 1

## 2014-09-12 MED ORDER — CARVEDILOL 25 MG PO TABS: 25.0000 mg | ORAL_TABLET | Freq: Two times a day (BID) | ORAL | Status: DC

## 2014-09-12 MED ORDER — FUROSEMIDE 40 MG PO TABS: 40.0000 mg | ORAL_TABLET | Freq: Two times a day (BID) | ORAL | Status: DC

## 2014-09-12 MED ORDER — HYDRALAZINE HCL 25 MG PO TABS: 75.0000 mg | ORAL_TABLET | Freq: Three times a day (TID) | ORAL | Status: DC

## 2014-09-12 NOTE — Care Management Note (Addendum)
Case Management Note  Patient Details  Name: DALAYLA ALDREDGE MRN: [id number] Date of Birth: 08-22-1945  Subjective/Objective:        HTN            Action/Plan: Home Health, notified [facility] of scheduled dc home today with HH. Pt brought portable tank for home.   Expected Discharge Date:  09/12/2014               Expected Discharge Plan:  [place]  In-House Referral:     Discharge planning Services  CM Consult  Post Acute Care Choice:  Home Health Choice offered to:  Patient     HH Arranged:  RN, PT, OT [facility] Agency:  [place]  Status of Service:  complete Medicare Important Message Given:  Yes-second notification given Date Medicare IM Given:    Medicare IM give by:    Date Additional Medicare IM Given:    Additional Medicare Important Message give by:     If discussed at [place] of Stay Meetings, dates discussed:    Additional Comments:  Erenest Rasher, RN 09/12/2014, 1:00 PM

## 2014-09-12 NOTE — Discharge Summary (Addendum)
Physician Discharge Summary  [name] [name] GGE:[id number] DOB: 10-06-1945 DOA: 09/03/2014  PCP: Glenda Chroman., MD  Admit date: 09/03/2014 Discharge date: 09/12/2014  Time spent: 35 minutes  Recommendations for Outpatient Follow-up:  1. Follow up with cardiologist in 1 week, check a b-met  BNP    Component Value Date/Time   PROBNP 2927.0* 03/20/2013 0341   Filed Weights   09/10/14 0543 09/11/14 0633 09/12/14 0500  Weight: 86.365 kg (190 lb 6.4 oz) 85.866 kg (189 lb 4.8 oz) 85.957 kg (189 lb 8 oz)     Discharge Diagnoses:  Principal Problem:   Hypertensive urgency Active Problems:   Hypertension, uncontrolled   Diabetes mellitus type II, uncontrolled   Acute on chronic combined systolic and diastolic heart failure   Pleural effusion, right   Acute on chronic respiratory failure with hypoxia   Pressure ulcer   CHF (congestive heart failure)   Acute on chronic diastolic congestive heart failure   Chest pain   Pulmonary hypertension   Discharge Condition: stable  Diet recommendation: low sodium   History of present illness:  69 y.o. female with h/o CAD, CABG in 2012, stent to CABG graft in feb of 2015. Patient presents to the ED at [facility] with c/o chest pain and SOB. She developed some SOB through the night last evening. She got up this morning, made breakfast, had her morning meds (including her 6 PO HTN meds she takes), and then vomited the meds back up. Her SOB got worse throughout the day and she developed chest pain as well.  Hospital Course:  Acute on chronic respiratory failure with hypoxia likely due to to acute on chronic systolic and diastolic heart failure with right-sided pleural effusion and severe pulmonary hypertension:  - Cardiology was consulted recommended IV diuresis. Diuresed about 5.0L. - Cr cont to be stable. Question if due to non compliance. - Her HF medication will need to be titrate d as an outpatient by cardiology - CXR showed, pulmonary  was consulted who recommended thoracocentesis, - US guided thoracocentesis done   seems transudate of cytology showed no malignant cells. Probably due to heart failure. - She requires oxygen at home. - PE was ruled out, with CT angio, and lower ext doppler.  Right pleural effusion: Status post ultrasound-guided thoracocentesis seems to be transudative, pulmonary consultant recommended follow-up with pulmonary as an outpatient.  Coronary artery sees/CABG: Continue aspirin Plavix Coreg.  Uncontrolled hypertension: Cont antihypertensive medication titration. All of her medications were resumed   Chest pain/coronary artery disease: Cardiac biomarkers were negative on this admission, cardiology was consulted to stitch and about a more pleuritic chest pain, she had no significant changes compared to previous EKGs.   Procedures:  CXR  ECho  Vq scan  Ct angio  Consultations:  Cardiology  Pulmonary  Discharge Exam: Filed Vitals:   09/12/14 1000  BP: 161/41  Pulse: 56  Temp: 98.4 F (36.9 C)  Resp: 18    General: A&O x3 Cardiovascular: RRR Respiratory: good air movement CTA B/L  Discharge Instructions  Discharge Instructions    Diet - low sodium heart healthy    Complete by:  As directed      Increase activity slowly    Complete by:  As directed             Medication List    STOP taking these medications        ibuprofen 200 MG tablet  Commonly known as:  ADVIL,MOTRIN     PRESCRIPTION MEDICATION  TAKE these medications        albuterol 108 (90 BASE) MCG/ACT inhaler  Commonly known as:  PROVENTIL HFA;VENTOLIN HFA  Inhale 2 puffs into the lungs daily as needed for wheezing or shortness of breath.     amLODipine 10 MG tablet  Commonly known as:  NORVASC  Take 1 tablet (10 mg total) by mouth daily.     aspirin 81 MG EC tablet  Take 81 mg by mouth daily.     atorvastatin 40 MG tablet  Commonly known as:  LIPITOR  Take 40 mg by mouth daily at  6 PM.     carvedilol 25 MG tablet  Commonly known as:  COREG  Take 1 tablet (25 mg total) by mouth 2 (two) times daily with a meal.     citalopram 20 MG tablet  Commonly known as:  CELEXA  Take 20 mg by mouth daily.     clopidogrel 75 MG tablet  Commonly known as:  PLAVIX  Take 1 tablet (75 mg total) by mouth daily with breakfast.     gabapentin 300 MG capsule  Commonly known as:  NEURONTIN  Take 300 mg by mouth daily.     glimepiride 2 MG tablet  Commonly known as:  AMARYL  Take 2 mg by mouth 2 (two) times daily.     HAIR/SKIN/NAILS Tabs  Take 1 tablet by mouth daily.     hydrALAZINE 25 MG tablet  Commonly known as:  APRESOLINE  Take 3 tablets (75 mg total) by mouth every 8 (eight) hours.     HYDROcodone-acetaminophen 5-325 MG per tablet  Commonly known as:  NORCO/VICODIN  Take 1 tablet by mouth every 6 (six) hours as needed for moderate pain.     insulin aspart 100 UNIT/ML injection  Commonly known as:  novoLOG  Inject 0-8 Units into the skin 3 (three) times daily with meals. >150 = 2 units, >200 = 4 units, >250 = 6 units, >300 = 8 units     insulin detemir 100 UNIT/ML injection  Commonly known as:  LEVEMIR  Inject 22 Units into the skin at bedtime.     isosorbide mononitrate 30 MG 24 hr tablet  Commonly known as:  IMDUR  Take 30 mg by mouth daily.     lisinopril 20 MG tablet  Commonly known as:  PRINIVIL,ZESTRIL  Take 1 tablet (20 mg total) by mouth daily.     multivitamin with minerals Tabs tablet  Take 1 tablet by mouth daily.     nitroGLYCERIN 0.4 MG SL tablet  Commonly known as:  NITROSTAT  Place 1 tablet (0.4 mg total) under the tongue every 5 (five) minutes as needed for chest pain. Up to 3 doses. If no relief after 3rd dose, proceed to the ED for an evaluation     potassium chloride 10 MEQ tablet  Commonly known as:  K-DUR  Take 1 tablet (10 mEq total) by mouth daily.     STOOL SOFTENER PO  Take 100 mg by mouth daily as needed (for constipation).      torsemide 20 MG tablet  Commonly known as:  DEMADEX  TAKE TWO TABLETS BY MOUTH IN THE MORNING AND ONE IN THE EVENING     Umeclidinium-Vilanterol 62.5-25 MCG/INH Aepb  Inhale 1 application into the lungs daily.       Allergies  Allergen Reactions  . Other Shortness Of Breath, Rash and Other (See Comments)    All berries   . Strawberry Hives, Shortness  Of Breath and Rash  . Sulfa Antibiotics Swelling  . Tape Other (See Comments)    Tears skin.  Please use "paper" tape only.       Follow-up Information    Follow up with VYAS,DHRUV B., MD In 2 weeks.   Specialty:  Internal Medicine   Why:  Hospital follow up in 2-4 weeks   Contact information:   [address] [place] [address] [phone]       Follow up with Shelva Majestic A, MD In 1 week.   Specialty:  Cardiology   Why:  heart failure follow up   Contact information:   [address] [place] [address] [phone]        The results of significant diagnostics from this hospitalization (including imaging, microbiology, ancillary and laboratory) are listed below for reference.    Significant Diagnostic Studies: Dg Chest 1 View  09/06/2014   CLINICAL DATA:  Status post right-sided thoracentesis.  EXAM: CHEST  1 VIEW  COMPARISON:  1 day prior  FINDINGS: Patient rotated right. Prior median sternotomy. Midline trachea. Mild cardiomegaly. Resolution of right pleural effusion. No apical pneumothorax. Right pleural thickening inferiorly. Cannot exclude trace air inferior and laterally. No left-sided pleural effusion. Interstitial edema is decreased, mild. No lobar consolidation.  IMPRESSION: Status post right-sided thoracentesis with resolution of right pleural effusion. No apical pneumothorax. Cannot exclude trace inferior lateral right pleural air. Recommend attention on follow-up.  Improved interstitial edema.   Electronically Signed   By: Abigail Miyamoto M.D.   On: 09/06/2014 10:49   Dg Chest 2  View  09/05/2014   CLINICAL DATA:  69 year old female with history of hypoxia. History of pneumonia.  EXAM: CHEST  2 VIEW  COMPARISON:  Chest x-ray 09/04/2014.  FINDINGS: There is cephalization of the pulmonary vasculature and slight indistinctness of the interstitial markings suggestive of mild pulmonary edema. Moderate right pleural effusion slightly decreased compared to the prior exam. Opacity in the right lower lobe and may reflect atelectasis and/or airspace consolidation. Mild cardiomegaly. Upper mediastinal contours are within normal limits. Atherosclerosis in the thoracic aorta. Status post median sternotomy for CABG. Surgical clips project over the right upper quadrant of the abdomen, compatible with prior cholecystectomy.  IMPRESSION: 1. The appearance the chest suggests congestive heart failure. 2. In addition, there is a moderate-sized chronic right pleural effusion with probable passive atelectasis in the right lung base (underlying airspace consolidation is not excluded, but is not strongly favored at this time). 3. Atherosclerosis.   Electronically Signed   By: Vinnie Langton M.D.   On: 09/05/2014 14:11   Dg Chest 2 View  09/04/2014   CLINICAL DATA:  Shortness of breath, hypoxia, history of CHF, CABG, and respiratory failure, history of previous heavy tobacco use.  EXAM: CHEST  2 VIEW  COMPARISON:  Portable chest x-ray of September 03, 2014  FINDINGS: There is persistent moderate-sized right pleural effusion. Right basilar atelectasis or pneumonia is suspected. The interstitial markings of both lungs are increased. There is subsegmental atelectasis in the lingula. No left pleural effusion is observed. The cardiac silhouette is enlarged. There are post CABG changes. The bony thorax exhibits no acute abnormality.  IMPRESSION: CHF with mild pulmonary interstitial edema and moderate-sized right-sided pleural effusion. Underlying atelectasis or pneumonia in the lower right lung is suspected. Continued  follow-up radiographs are recommended to assure positive response to therapy.   Electronically Signed   By: David  Martinique M.D.   On: 09/04/2014 08:38  Ct Angio Chest Pe W/cm &/or Wo Cm  09/07/2014   CLINICAL DATA:  Shortness of breath for several days. New chest pain with inspiration. Status post thoracentesis 09/06/2014. The patient's pain is to the right of midline. No known injury. Subsequent encounter.  EXAM: CT ANGIOGRAPHY CHEST WITH CONTRAST  TECHNIQUE: Multidetector CT imaging of the chest was performed using the standard protocol during bolus administration of intravenous contrast. Multiplanar CT image reconstructions and MIPs were obtained to evaluate the vascular anatomy.  CONTRAST:  80 mL OMNIPAQUE IOHEXOL 350 MG/ML SOLN  COMPARISON:  CT chest 12/22/2013 and 07/28/2013. Single view of the chest 09/05/2013.  FINDINGS: No pulmonary embolus is identified. There is cardiomegaly. A very small left pleural effusion is noted. A moderate to moderately large right pleural effusion is seen. There is no pericardial effusion. As on prior examinations, there are some mildly prominent lymph nodes in the mediastinum and hila including a 1.4 cm pretracheal node on image 41 and 1.6 cm right hilar lymph node on image 50. No new or enlarging lymph nodes are identified in the axilla, hila or mediastinum. The patient is status post CABG.  Scattered ground-glass attenuation is present with interlobular septal thickening. There is no pneumothorax. Mild compressive atelectasis is more notable on the right. No consolidative process, nodule or mass is identified.  Visualized upper abdomen shows small amount of perihepatic ascites. Imaged intra-abdominal contents are otherwise unremarkable. No lytic or sclerotic bony lesion is seen.  Review of the MIP images confirms the above findings.  IMPRESSION: Negative for pulmonary embolus.  Findings most consistent with congestive heart failure with an associated moderate to moderately  large right pleural effusion.  Very small left pleural effusion after thoracentesis. Negative for pneumothorax.  Small volume of perihepatic ascites.  Mildly enlarged mediastinal and hilar lymph nodes are chronic and likely reactive.   Electronically Signed   By: Inge Rise M.D.   On: 09/07/2014 09:55   Nm Pulmonary Perf And Vent  09/05/2014   CLINICAL DATA:  Hypoxia, chest pain, episode of vomiting, history benign essential hypertension, carotid artery disease, coronary artery disease, ischemic cardiomyopathy, MI, type II diabetes mellitus, CHF, former smoker  EXAM: NUCLEAR MEDICINE VENTILATION - PERFUSION LUNG SCAN  TECHNIQUE: Ventilation images were obtained in multiple projections using inhaled aerosol Tc-6mDTPA. Perfusion images were obtained in multiple projections after intravenous injection of Tc-938mAA.  RADIOPHARMACEUTICALS:  39.5 mCi Technetium-9916mPA aerosol inhalation and 5.4 mCi of Technetium-6m54m IV  COMPARISON:  None  Correlation: Chest radiograph 09/05/2014  FINDINGS: Ventilation: Absent ventilation RIGHT lower lobe. Diminished ventilation RIGHT middle lobe. Minimal diminished ventilation in lingula. Swallowed aerosol within stomach.  Perfusion: Matching diminished perfusion in RIGHT middle lobe. Perfusion is present in a portion of posterior aspect of the RIGHT lower lobe with absent perfusion in the remaining RIGHT lower lobe. Tiny subsegmental perfusion defect in lingula, matching.  Chest radiograph: Enlargement of cardiac silhouette with pulmonary vascular congestion, mild pulmonary edema, and moderate to large RIGHT pleural effusion with basilar atelectasis.  IMPRESSION: Significant ventilatory, perfusion and radiographic abnormalities involving the RIGHT lower lobe, though perfusion is slightly better than ventilation.  Additional matching ventilatory, perfusion, and radiographic abnormalities in RIGHT middle lobe.  Findings represent an intermediate probability for pulmonary  embolism.   Electronically Signed   By: MarkLavonia Dana.   On: 09/05/2014 13:55   Dg Chest Port 1v Same Day  09/08/2014   CLINICAL DATA:  Pleural effusion. Shortness of breath worsening today.  EXAM:  PORTABLE CHEST - 1 VIEW SAME DAY  COMPARISON:  Chest CT dated 09/07/2014 and chest x-ray dated 09/06/2014.  FINDINGS: Moderate cardiomegaly is unchanged. There is increased central pulmonary vascular congestion and bilateral interstitial edema suggesting congestive heart failure. Increasing opacity at the right lung base is most likely a combination of associated edema and atelectasis, with adjacent effusion. The small to moderate right-sided pleural effusion is probably stable compared to the recent chest CT. No pneumothorax.  IMPRESSION: Cardiomegaly with central pulmonary vascular congestion and bilateral interstitial edema suggesting volume overload/CHF, worsened since chest x-ray of 09/06/2014.  Small to moderate right pleural effusion, probably stable.   Electronically Signed   By: Franki Cabot M.D.   On: 09/08/2014 12:35   US Thoracentesis Asp Pleural Space W/img Guide  09/06/2014   CLINICAL DATA:  CHF, right pleural effusion, request for right thoracentesis  EXAM: ULTRASOUND GUIDED RIGHT THORACENTESIS  COMPARISON:  None.  PROCEDURE: An ultrasound guided thoracentesis was thoroughly discussed with the patient and questions answered. The benefits, risks, alternatives and complications were also discussed. The patient understands and wishes to proceed with the procedure. Written consent was obtained.  Ultrasound was performed to localize and mark an adequate pocket of fluid in the right chest. The area was then prepped and draped in the normal sterile fashion. 1% Lidocaine was used for local anesthesia. Under ultrasound guidance a Safe T Centesis catheter was introduced. Thoracentesis was performed. The catheter was removed and a dressing applied.  COMPLICATIONS: None immediate.  FINDINGS: A total of  approximately 1500 mls of clear bloody fluid was removed. A fluid sample wassent for laboratory analysis.  IMPRESSION: Successful ultrasound guided right thoracentesis yielding 1500 mls of pleural fluid.  Read by:  Gareth Eagle, PA-C   Electronically Signed   By: Jerilynn Mages.  Shick M.D.   On: 09/06/2014 10:42    Microbiology: Recent Results (from the past 240 hour(s))  MRSA PCR Screening     Status: Abnormal   Collection Time: 09/03/14 10:59 PM  Result Value Ref Range Status   MRSA by PCR POSITIVE (A) NEGATIVE Final    Comment:        The GeneXpert MRSA Assay (FDA approved for NASAL specimens only), is one component of a comprehensive MRSA colonization surveillance program. It is not intended to diagnose MRSA infection nor to guide or monitor treatment for MRSA infections. RESULT CALLED TO, READ BACK BY AND VERIFIED WITH: H YANBU _0  09/04/14   Culture, body fluid-bottle     Status: None   Collection Time: 09/06/14 10:40 AM  Result Value Ref Range Status   Specimen Description FLUID RIGHT PLEURAL  Final   Special Requests NONE  Final   Culture NO GROWTH 5 DAYS  Final   Report Status 09/11/2014 FINAL  Final  Gram stain     Status: None   Collection Time: 09/06/14 10:40 AM  Result Value Ref Range Status   Specimen Description FLUID RIGHT PLEURAL  Final   Special Requests BAA 10CCS  Final   Gram Stain   Final    FEW WBC PRESENT,BOTH PMN AND MONONUCLEAR NO ORGANISMS SEEN    Report Status 09/06/2014 FINAL  Final  C difficile quick scan w PCR reflex     Status: None   Collection Time: 09/08/14  8:45 PM  Result Value Ref Range Status   C Diff antigen NEGATIVE NEGATIVE Final   C Diff toxin NEGATIVE NEGATIVE Final   C Diff interpretation Negative for toxigenic C. difficile  Final  Labs: Basic Metabolic Panel:  Recent Labs Lab 09/08/14 0354 09/09/14 0245 09/10/14 1004 09/11/14 0359 09/12/14 0319  NA 139 138 136 135 135  K 4.4 4.6 4.8 4.6 4.1  CL 104 102 100* 99* 97*  CO2  32 _0 32  GLUCOSE 124* 88 202* 148* 133*  BUN 25* 27* 27* 29* 29*  CREATININE 1.05* 1.18* 1.28* 1.28* 1.23*  CALCIUM 8.6* 8.4* 8.7* 8.6* 8.7*   Liver Function Tests:  Recent Labs Lab 09/06/14 1157  PROT 6.2*   No results for input(s): LIPASE, AMYLASE in the last 168 hours. No results for input(s): AMMONIA in the last 168 hours. CBC:  Recent Labs Lab 09/06/14 0305 09/07/14 0230 09/08/14 0354 09/09/14 0245  WBC 6.7 6.7 5.5 5.6  HGB 9.4* 9.5* 9.7* 9.4*  HCT 29.8* 29.9* 30.6* 30.8*  MCV 92.5 90.3 91.6 92.2  PLT 164 178 148* 164   Cardiac Enzymes: No results for input(s): CKTOTAL, CKMB, CKMBINDEX, TROPONINI in the last 168 hours. BNP: BNP (last 3 results)  Recent Labs  09/09/14 0245  BNP 436.8*    ProBNP (last 3 results) No results for input(s): PROBNP in the last 8760 hours.  CBG:  Recent Labs Lab 09/11/14 1144 09/11/14 1642 09/11/14 2128 09/12/14 0227 09/12/14 0608  GLUCAP 167* 177* 94 145* 110*     Signed:  FELIZ ORTIZ, Allysia Ingles  [facility] 09/12/2014, 11:03 AM

## 2014-09-15 ENCOUNTER — Telehealth: Payer: Self-pay | Admitting: Cardiology

## 2014-09-15 MED ORDER — CARVEDILOL 25 MG PO TABS: 25.0000 mg | ORAL_TABLET | Freq: Two times a day (BID) | ORAL | Status: DC

## 2014-09-15 MED ORDER — ISOSORBIDE MONONITRATE ER 30 MG PO TB24: 30.0000 mg | ORAL_TABLET | Freq: Every day | ORAL | Status: DC

## 2014-09-15 NOTE — Telephone Encounter (Signed)
[facility] has questions regarding patient's medications post discharge from [place]

## 2014-09-15 NOTE — Telephone Encounter (Signed)
Need rx for imdur and coreg per home health nurse.

## 2014-09-16 ENCOUNTER — Ambulatory Visit: Payer: Medicare Other | Admitting: Physician Assistant

## 2014-09-17 ENCOUNTER — Ambulatory Visit: Payer: Medicare Other | Admitting: Cardiovascular Disease

## 2014-09-21 ENCOUNTER — Telehealth: Payer: Self-pay | Admitting: Cardiology

## 2014-09-21 ENCOUNTER — Other Ambulatory Visit: Payer: Self-pay | Admitting: *Deleted

## 2014-09-21 MED ORDER — HYDRALAZINE HCL 25 MG PO TABS: 75.0000 mg | ORAL_TABLET | Freq: Three times a day (TID) | ORAL | Status: DC

## 2014-09-21 NOTE — Telephone Encounter (Signed)
Ashlan from [facility] calling for pt recent d/c from [facility], needed refill on medication and requesting to see Dr. Domenic Polite in [place] rather than Dr. Claiborne Billings in [place] due to transportation and [place] is closer. Scheduled pt 9/23 with Dr. Domenic Polite and refilled hydralazine

## 2014-09-21 NOTE — Telephone Encounter (Signed)
Medication question in reference hydralazine

## 2014-09-30 ENCOUNTER — Other Ambulatory Visit: Payer: Self-pay | Admitting: *Deleted

## 2014-09-30 MED ORDER — NITROGLYCERIN 0.4 MG SL SUBL: 0.4000 mg | SUBLINGUAL_TABLET | SUBLINGUAL | Status: AC | PRN

## 2014-10-02 ENCOUNTER — Encounter: Payer: Self-pay | Admitting: Cardiology

## 2014-10-02 ENCOUNTER — Ambulatory Visit (INDEPENDENT_AMBULATORY_CARE_PROVIDER_SITE_OTHER): Payer: Medicare Other | Admitting: Cardiology

## 2014-10-02 VITALS — BP 138/60 | HR 52 | Ht 66.0 in | Wt 198.0 lb

## 2014-10-02 DIAGNOSIS — I25119 Atherosclerotic heart disease of native coronary artery with unspecified angina pectoris: Secondary | ICD-10-CM

## 2014-10-02 DIAGNOSIS — I5032 Chronic diastolic (congestive) heart failure: Secondary | ICD-10-CM

## 2014-10-02 DIAGNOSIS — E782 Mixed hyperlipidemia: Secondary | ICD-10-CM | POA: Diagnosis not present

## 2014-10-02 DIAGNOSIS — I6523 Occlusion and stenosis of bilateral carotid arteries: Secondary | ICD-10-CM

## 2014-10-02 DIAGNOSIS — I10 Essential (primary) hypertension: Secondary | ICD-10-CM | POA: Diagnosis not present

## 2014-10-02 MED ORDER — ISOSORBIDE MONONITRATE ER 60 MG PO TB24: 60.0000 mg | ORAL_TABLET | Freq: Every day | ORAL | Status: DC

## 2014-10-02 MED ORDER — TORSEMIDE 20 MG PO TABS: 40.0000 mg | ORAL_TABLET | Freq: Every morning | ORAL | Status: DC

## 2014-10-02 NOTE — Patient Instructions (Signed)
Your physician has recommended you make the following change in your medication:  Increase your isosorbide mononitrate to 60 mg daily. You may take (2) of your 30 mg tablets daily until they are finished. You may take an extra tablet of torsemide in the evenings if your weight increase by 3 lbs in a day and continue this until your weight is back down to around 195 lbs; then resume previous dosage. Continue all other medications the same. Your physician recommends that you have lab work in 6 weeks just before your next visit to check your BMET. Your physician recommends that you schedule a follow-up appointment in: 6 weeks.

## 2014-10-02 NOTE — Progress Notes (Signed)
Cardiology Office Note  Date: 10/02/2014   ID: [name] [name], DOB August 31, 1945, MRN [id number]  PCP: Glenda Chroman., MD  Primary Cardiologist: Rozann Lesches, MD   Chief Complaint  Patient presents with  . Hospitalization Follow-up    History of Present Illness: [name] [name] is a medically complex 69 y.o. female last seen in April. Record review finds recent hospitalization with acute on chronic respiratory failure complicated by diastolic heart failure and volume overload. Patient diuresed 5 pounds and also underwent ultrasound-guided thoracentesis. Cardiac markers argued against ACS, and follow-up echocardiogram showed LVEF 50-55% as outlined below. I see that there was some concern about possible medication noncompliance as well.  She comes in with her daughter today for a follow-up visit. Does feel better, still with chronic shortness of breath and intermittent angina. She has used a few nitroglycerin tablets. We reviewed her medications in detail. She is actually been taking hydralazine at 25 mg 3 times a day, blood pressures have been stable. We discussed increasing Demadex dose based on weight change. She does have a scale at home.  Lab work and recent echocardiogram are reviewed below.   Past Medical History  Diagnosis Date  . Mixed hyperlipidemia   . Essential hypertension, benign   . Depression   . Cellulitis     a. Recurrent, bilateral legs  . Carotid artery disease     a. Bilateral CEA; 50-60% bilateral ICA stenosis, 11/12  . PAD (peripheral artery disease)   . Suicide attempt 08/2002  . TIA (transient ischemic attack)   . TMJ syndrome   . Herniated disc   . Ischemic cardiomyopathy     a. 11/2010 TEE: EF 40-45%.  . Critical lower limb ischemia   . Venous insufficiency   . Coronary atherosclerosis of native coronary artery     a. 11/2010 NSTEMI/CABG x 4: L-LAD; S-PL; S-OM; S-DX, by PVT.  [name] NSTEMI (non-ST elevated myocardial infarction) 11/2010  . Pneumonia  2000's X 2  . Type 2 diabetes mellitus   . History of blood transfusion   . Migraine   . Stroke 08/2009  . Arthritis   . Chronic back pain   . On home oxygen therapy   . Chronic diastolic heart failure     Past Surgical History  Procedure Laterality Date  . Appendectomy    . Cholecystectomy    . Abdominal hysterectomy    . Carotid endarterectomy Bilateral 2011    Bilateral - Dr. Kellie Simmering  . Wrist fracture surgery Left     "grafted bone from hip to wrist"  . Coronary artery bypass graft  11/24/2010    Procedure: CORONARY ARTERY BYPASS GRAFTING (CABG);  Surgeon: Tharon Aquas Adelene Idler, MD;  Location: [place];  Service: Open Heart Surgery;  Laterality: N/A;  Coronary Artery Bypass Graft times four on pump utilizing left internal mammary artery and bilateral greater saphenous veins harvested endoscopically, transesophageal echocardiogram   . Toe surgery Left     "put pin in 2nd toe"  . Lower extremity angiogram  09/08/2012; 10/06/2013    "found 100% blockage; unsuccessful attempt at crossing a chronic total occlusion of the left SFA in the setting of critical limb ischemia  . Dilation and curettage of uterus    . Tubal ligation    . Debridement toe Left     "nonhealing wound; 3rd digit  . Cardiac catheterization  11/2010  . Coronary angioplasty with stent placement  03/2013    "1"  . Colonoscopy w/ biopsies  and polypectomy    . Fracture surgery    . Femoral-popliteal bypass graft Left 10/07/2013    Procedure: LEFT FEMORAL-POPLITEAL ARTERY BYPASS GRAFT;  Surgeon: Angelia Mould, MD;  Location: [place];  Service: Vascular;  Laterality: Left;  . Intraoperative arteriogram Left 10/07/2013    Procedure: INTRA OPERATIVE ARTERIOGRAM LEFT LEG;  Surgeon: Angelia Mould, MD;  Location: [place];  Service: Vascular;  Laterality: Left;  . Endarterectomy popliteal Left 10/07/2013    Procedure: LEFT POPLITEAL ENDARTERECTOMY ;  Surgeon: Angelia Mould, MD;  Location: [place];  Service: Vascular;   Laterality: Left;  . Left and right heart catheterization with coronary/graft angiogram N/A 03/27/2013    Procedure: LEFT AND RIGHT HEART CATHETERIZATION WITH Beatrix Fetters;  Surgeon: Burnell Blanks, MD;  Location: [facility] CATH LAB;  Service: Cardiovascular;  Laterality: N/A;  . Lower extremity angiogram Bilateral 09/08/2013    Procedure: LOWER EXTREMITY ANGIOGRAM;  Surgeon: [name], MD;  Location: [facility] CATH LAB;  Service: Cardiovascular;  Laterality: Bilateral;    Current Outpatient Prescriptions  Medication Sig Dispense Refill  . albuterol (PROVENTIL HFA;VENTOLIN HFA) 108 (90 BASE) MCG/ACT inhaler Inhale 2 puffs into the lungs daily as needed for wheezing or shortness of breath.     [name] amLODipine (NORVASC) 10 MG tablet Take 1 tablet (10 mg total) by mouth daily. 90 tablet 3  . aspirin 81 MG EC tablet Take 81 mg by mouth daily.    [name] atorvastatin (LIPITOR) 40 MG tablet Take 40 mg by mouth daily at 6 PM.    . carvedilol (COREG) 25 MG tablet Take 1 tablet (25 mg total) by mouth 2 (two) times daily with a meal. 60 tablet 3  . citalopram (CELEXA) 20 MG tablet Take 20 mg by mouth daily.    . clopidogrel (PLAVIX) 75 MG tablet Take 1 tablet (75 mg total) by mouth daily with breakfast. 30 tablet 9  . Docusate Calcium (STOOL SOFTENER PO) Take 100 mg by mouth daily as needed (for constipation).     . gabapentin (NEURONTIN) 300 MG capsule Take 300 mg by mouth daily.    [name] glimepiride (AMARYL) 2 MG tablet Take 2 mg by mouth 2 (two) times daily.     . hydrALAZINE (APRESOLINE) 25 MG tablet Take 25 mg by mouth 3 (three) times daily.    [name] HYDROcodone-acetaminophen (NORCO/VICODIN) 5-325 MG per tablet Take 1 tablet by mouth every 6 (six) hours as needed for moderate pain. 30 tablet 0  . insulin aspart (NOVOLOG) 100 UNIT/ML injection Inject 0-8 Units into the skin 3 (three) times daily with meals. >150 = 2 units, >200 = 4 units, >250 = 6 units, >300 = 8 units    . insulin detemir (LEVEMIR) 100  UNIT/ML injection Inject 22 Units into the skin at bedtime.     [name] lisinopril (PRINIVIL,ZESTRIL) 20 MG tablet Take 1 tablet (20 mg total) by mouth daily. 90 tablet 3  . Multiple Vitamin (MULTIVITAMIN WITH MINERALS) TABS tablet Take 1 tablet by mouth daily.    . Multiple Vitamins-Minerals (HAIR/SKIN/NAILS) TABS Take 1 tablet by mouth daily.    . nitroGLYCERIN (NITROSTAT) 0.4 MG SL tablet Place 1 tablet (0.4 mg total) under the tongue every 5 (five) minutes as needed for chest pain. Up to 3 doses. If no relief after 3rd dose, proceed to the ED for an evaluation 25 tablet 3  . potassium chloride (K-DUR) 10 MEQ tablet Take 1 tablet (10 mEq total) by mouth daily. 90 tablet 3  .  torsemide (DEMADEX) 20 MG tablet Take 2 tablets (40 mg total) by mouth every morning. & 20 mg in the evening. You may take an extra tablet in the evening if your weight increases 3 lbs in a day until your weight is back down to around 195 lbs, then resume previous dosage 90 tablet 3  . Umeclidinium-Vilanterol 62.5-25 MCG/INH AEPB Inhale 1 application into the lungs daily.     . isosorbide mononitrate (IMDUR) 60 MG 24 hr tablet Take 1 tablet (60 mg total) by mouth daily. 90 tablet 3   No current facility-administered medications for this visit.    Allergies:  Other; Strawberry; Sulfa antibiotics; and Tape   Social History: The patient  reports that she quit smoking about 4 years ago. Her smoking use included Cigarettes. She started smoking about 58 years ago. She has a 150 pack-year smoking history. She has never used smokeless tobacco. She reports that she drinks alcohol. She reports that she uses illicit drugs (Marijuana).   ROS:  Please see the history of present illness. Otherwise, complete review of systems is positive for improved diarrhea.  All other systems are reviewed and negative.   Physical Exam: VS:  BP 138/60 mmHg  Pulse 52  Ht _0  (1.676 m)  Wt 198 lb (89.812 kg)  BMI 31.97 kg/m2  SpO2 97%, BMI Body mass  index is 31.97 kg/(m^2).  Wt Readings from Last 3 Encounters:  10/02/14 198 lb (89.812 kg)  09/12/14 189 lb 8 oz (85.957 kg)  05/13/14 198 lb (89.812 kg)     Chronically ill-appearing woman, no distress. Wearing oxygen. HEENT: Conjunctiva and lids normal, oropharynx clear. Neck: Supple, no elevated JVP, bilateral CEA scars, no thyromegaly. Lungs: Clear to auscultation with decreased breath sounds with crackles at the right lower to midlung zone, nonlabored breathing at rest. Cardiac: Regular rate and rhythm, no S3, soft systolic murmur, no pericardial rub. Abdomen: Soft, nontender, protuberant, bowel sounds present, no guarding or rebound. Extremities: Chronic appearing edema, distal pulses 1+. Skin: Warm and dry. Drying and flaking noted mid to distal legs. Musculoskeletal: No kyphosis. Neuropsychiatric: Alert and oriented x3, affect grossly appropriate.   ECG: Tracing from 09/08/2014 showed sinus bradycardia with nonspecific ST changes.   Recent Labwork: 09/09/2014: B Natriuretic Peptide 436.8*; Hemoglobin 9.4*; Platelets 164 09/12/2014: BUN 29*; Creatinine, Ser 1.23*; Potassium 4.1; Sodium 135   Other Studies Reviewed Today:  Echocardiogram 09/09/2014: Study Conclusions  - Left ventricle: The cavity size was normal. Wall thickness was normal. Systolic function was normal. The estimated ejection fraction was in the range of 50% to 55%. Wall motion was normal; there were no regional wall motion abnormalities. - Left atrium: The atrium was mildly dilated.   Assessment and Plan:  1. Chronic diastolic heart failure, LVEF 50-55%. Weight has been relatively stable in the upper 190s. We discussed using extra Demadex dose in the evening if her weight increases 3 pounds in 24 hours. Otherwise continue current regimen, we can advance hydralazine further blood pressure trends back up.  2. Ischemic heart disease status post CABG. No ACS with recent hospitalization. Increase Imdur to  60 mg daily for now.  3. Essential hypertension, continue to adjust medications over time.  4. Type 2 diabetes mellitus, keep follow-up with Dr. Woody Seller.  5. Hyperlipidemia, on Lipitor.  Current medicines were reviewed with the patient today.   Orders Placed This Encounter  Procedures  . Basic metabolic panel    Disposition: FU with me in 6 weeks.   Signed, Mikeal Hawthorne  Delman Kitten, MD, [facility] 10/02/2014 3:38 PM    [facility] [address] [place] [facility] [address], [place], [place] [address] Phone: [phone]; Fax: [phone]

## 2014-10-05 ENCOUNTER — Telehealth: Payer: Self-pay

## 2014-10-05 MED ORDER — ISOSORBIDE MONONITRATE ER 60 MG PO TB24: 45.0000 mg | ORAL_TABLET | Freq: Every day | ORAL | Status: DC

## 2014-10-05 NOTE — Telephone Encounter (Signed)
Pt will try reduced dose of Imdur and let us know how she feels

## 2014-10-05 NOTE — Telephone Encounter (Signed)
-----  Message from Satira Sark, MD sent at 10/05/2014  8:40 AM EDT ----- Regarding: RE: Severe HA Not an uncommon side effect. She was tolerating 30 mg previously. See if she can take 45 mg (one and a half tablets).  ----- Message -----    From: Bernita Raisin, RN    Sent: 10/05/2014   8:29 AM      To: Satira Sark, MD Subject: Severe HA                                      Pt increased Imdur to 60 mg on Friday and has severe HA now per PT at her home

## 2014-10-10 ENCOUNTER — Encounter (HOSPITAL_COMMUNITY): Payer: Self-pay | Admitting: Emergency Medicine

## 2014-10-10 ENCOUNTER — Inpatient Hospital Stay (HOSPITAL_COMMUNITY)
Admission: EM | Admit: 2014-10-10 | Discharge: 2014-10-19 | DRG: 291 | Disposition: A | Discharge status: Skilled Nursing Facility | Payer: Medicare Other | Attending: Internal Medicine | Admitting: Internal Medicine

## 2014-10-10 ENCOUNTER — Emergency Department (HOSPITAL_COMMUNITY): Payer: Medicare Other

## 2014-10-10 DIAGNOSIS — Z955 Presence of coronary angioplasty implant and graft: Secondary | ICD-10-CM

## 2014-10-10 DIAGNOSIS — Z7984 Long term (current) use of oral hypoglycemic drugs: Secondary | ICD-10-CM

## 2014-10-10 DIAGNOSIS — E11649 Type 2 diabetes mellitus with hypoglycemia without coma: Secondary | ICD-10-CM | POA: Diagnosis not present

## 2014-10-10 DIAGNOSIS — D509 Iron deficiency anemia, unspecified: Secondary | ICD-10-CM | POA: Diagnosis present

## 2014-10-10 DIAGNOSIS — Z9981 Dependence on supplemental oxygen: Secondary | ICD-10-CM | POA: Diagnosis not present

## 2014-10-10 DIAGNOSIS — E1165 Type 2 diabetes mellitus with hyperglycemia: Secondary | ICD-10-CM | POA: Diagnosis not present

## 2014-10-10 DIAGNOSIS — J9621 Acute and chronic respiratory failure with hypoxia: Secondary | ICD-10-CM | POA: Diagnosis present

## 2014-10-10 DIAGNOSIS — J948 Other specified pleural conditions: Secondary | ICD-10-CM | POA: Diagnosis not present

## 2014-10-10 DIAGNOSIS — Z9689 Presence of other specified functional implants: Secondary | ICD-10-CM

## 2014-10-10 DIAGNOSIS — Z91199 Patient's noncompliance with other medical treatment and regimen due to unspecified reason: Secondary | ICD-10-CM

## 2014-10-10 DIAGNOSIS — Z8673 Personal history of transient ischemic attack (TIA), and cerebral infarction without residual deficits: Secondary | ICD-10-CM | POA: Diagnosis not present

## 2014-10-10 DIAGNOSIS — J90 Pleural effusion, not elsewhere classified: Secondary | ICD-10-CM | POA: Diagnosis present

## 2014-10-10 DIAGNOSIS — I252 Old myocardial infarction: Secondary | ICD-10-CM

## 2014-10-10 DIAGNOSIS — R001 Bradycardia, unspecified: Secondary | ICD-10-CM | POA: Diagnosis not present

## 2014-10-10 DIAGNOSIS — Z7982 Long term (current) use of aspirin: Secondary | ICD-10-CM | POA: Diagnosis not present

## 2014-10-10 DIAGNOSIS — J9601 Acute respiratory failure with hypoxia: Secondary | ICD-10-CM | POA: Diagnosis not present

## 2014-10-10 DIAGNOSIS — I739 Peripheral vascular disease, unspecified: Secondary | ICD-10-CM | POA: Diagnosis not present

## 2014-10-10 DIAGNOSIS — I110 Hypertensive heart disease with heart failure: Secondary | ICD-10-CM | POA: Diagnosis not present

## 2014-10-10 DIAGNOSIS — L97509 Non-pressure chronic ulcer of other part of unspecified foot with unspecified severity: Secondary | ICD-10-CM | POA: Diagnosis present

## 2014-10-10 DIAGNOSIS — I5043 Acute on chronic combined systolic (congestive) and diastolic (congestive) heart failure: Secondary | ICD-10-CM | POA: Diagnosis not present

## 2014-10-10 DIAGNOSIS — E162 Hypoglycemia, unspecified: Secondary | ICD-10-CM | POA: Diagnosis not present

## 2014-10-10 DIAGNOSIS — I255 Ischemic cardiomyopathy: Secondary | ICD-10-CM | POA: Diagnosis present

## 2014-10-10 DIAGNOSIS — M79605 Pain in left leg: Secondary | ICD-10-CM

## 2014-10-10 DIAGNOSIS — E782 Mixed hyperlipidemia: Secondary | ICD-10-CM | POA: Diagnosis present

## 2014-10-10 DIAGNOSIS — I472 Ventricular tachycardia: Secondary | ICD-10-CM | POA: Diagnosis not present

## 2014-10-10 DIAGNOSIS — Z87891 Personal history of nicotine dependence: Secondary | ICD-10-CM

## 2014-10-10 DIAGNOSIS — Z515 Encounter for palliative care: Secondary | ICD-10-CM | POA: Diagnosis not present

## 2014-10-10 DIAGNOSIS — Z7902 Long term (current) use of antithrombotics/antiplatelets: Secondary | ICD-10-CM

## 2014-10-10 DIAGNOSIS — D649 Anemia, unspecified: Secondary | ICD-10-CM | POA: Diagnosis not present

## 2014-10-10 DIAGNOSIS — I872 Venous insufficiency (chronic) (peripheral): Secondary | ICD-10-CM | POA: Diagnosis not present

## 2014-10-10 DIAGNOSIS — E118 Type 2 diabetes mellitus with unspecified complications: Secondary | ICD-10-CM | POA: Diagnosis not present

## 2014-10-10 DIAGNOSIS — L97519 Non-pressure chronic ulcer of other part of right foot with unspecified severity: Secondary | ICD-10-CM | POA: Diagnosis present

## 2014-10-10 DIAGNOSIS — D638 Anemia in other chronic diseases classified elsewhere: Secondary | ICD-10-CM | POA: Diagnosis present

## 2014-10-10 DIAGNOSIS — F329 Major depressive disorder, single episode, unspecified: Secondary | ICD-10-CM | POA: Diagnosis not present

## 2014-10-10 DIAGNOSIS — I272 Other secondary pulmonary hypertension: Secondary | ICD-10-CM | POA: Diagnosis present

## 2014-10-10 DIAGNOSIS — I2510 Atherosclerotic heart disease of native coronary artery without angina pectoris: Secondary | ICD-10-CM | POA: Diagnosis present

## 2014-10-10 DIAGNOSIS — Z794 Long term (current) use of insulin: Secondary | ICD-10-CM

## 2014-10-10 DIAGNOSIS — Z9119 Patient's noncompliance with other medical treatment and regimen: Secondary | ICD-10-CM

## 2014-10-10 DIAGNOSIS — I5020 Unspecified systolic (congestive) heart failure: Secondary | ICD-10-CM | POA: Diagnosis not present

## 2014-10-10 DIAGNOSIS — R4189 Other symptoms and signs involving cognitive functions and awareness: Secondary | ICD-10-CM | POA: Diagnosis not present

## 2014-10-10 DIAGNOSIS — R0602 Shortness of breath: Secondary | ICD-10-CM | POA: Diagnosis present

## 2014-10-10 DIAGNOSIS — D508 Other iron deficiency anemias: Secondary | ICD-10-CM | POA: Diagnosis not present

## 2014-10-10 DIAGNOSIS — I10 Essential (primary) hypertension: Secondary | ICD-10-CM | POA: Diagnosis present

## 2014-10-10 DIAGNOSIS — Z66 Do not resuscitate: Secondary | ICD-10-CM | POA: Diagnosis present

## 2014-10-10 DIAGNOSIS — Z9889 Other specified postprocedural states: Secondary | ICD-10-CM

## 2014-10-10 DIAGNOSIS — R0902 Hypoxemia: Secondary | ICD-10-CM

## 2014-10-10 DIAGNOSIS — IMO0002 Reserved for concepts with insufficient information to code with codable children: Secondary | ICD-10-CM | POA: Diagnosis present

## 2014-10-10 DIAGNOSIS — E1140 Type 2 diabetes mellitus with diabetic neuropathy, unspecified: Secondary | ICD-10-CM | POA: Diagnosis present

## 2014-10-10 DIAGNOSIS — I5033 Acute on chronic diastolic (congestive) heart failure: Secondary | ICD-10-CM | POA: Diagnosis present

## 2014-10-10 DIAGNOSIS — Z951 Presence of aortocoronary bypass graft: Secondary | ICD-10-CM | POA: Diagnosis not present

## 2014-10-10 DIAGNOSIS — R404 Transient alteration of awareness: Secondary | ICD-10-CM | POA: Diagnosis not present

## 2014-10-10 DIAGNOSIS — Z79899 Other long term (current) drug therapy: Secondary | ICD-10-CM

## 2014-10-10 LAB — CBC WITH DIFFERENTIAL/PLATELET
Basophils Absolute: 0 10*3/uL (ref 0.0–0.1)
Basophils Relative: 0 %
EOS ABS: 0 10*3/uL (ref 0.0–0.7)
Eosinophils Relative: 0 %
HEMATOCRIT: 29.2 % — AB (ref 36.0–46.0)
HEMOGLOBIN: 9.3 g/dL — AB (ref 12.0–15.0)
LYMPHS ABS: 0.7 10*3/uL (ref 0.7–4.0)
LYMPHS PCT: 6 %
MCH: 28.6 pg (ref 26.0–34.0)
MCHC: 31.8 g/dL (ref 30.0–36.0)
MCV: 89.8 fL (ref 78.0–100.0)
MONOS PCT: 6 %
Monocytes Absolute: 0.7 10*3/uL (ref 0.1–1.0)
NEUTROS ABS: 9.8 10*3/uL — AB (ref 1.7–7.7)
NEUTROS PCT: 88 %
Platelets: 190 10*3/uL (ref 150–400)
RBC: 3.25 MIL/uL — AB (ref 3.87–5.11)
RDW: 15.4 % (ref 11.5–15.5)
WBC: 11.2 10*3/uL — AB (ref 4.0–10.5)

## 2014-10-10 LAB — BLOOD GAS, ARTERIAL
ACID-BASE EXCESS: 4.7 mmol/L — AB (ref 0.0–2.0)
Bicarbonate: 28.5 mEq/L — ABNORMAL HIGH (ref 20.0–24.0)
Drawn by: 105551
FIO2: 100
O2 SAT: 93.3 %
PATIENT TEMPERATURE: 98.6
PO2 ART: 68.1 mmHg — AB (ref 80.0–100.0)
pCO2 arterial: 43 mmHg (ref 35.0–45.0)
pH, Arterial: 7.44 (ref 7.350–7.450)

## 2014-10-10 LAB — BRAIN NATRIURETIC PEPTIDE: B Natriuretic Peptide: 914 pg/mL — ABNORMAL HIGH (ref 0.0–100.0)

## 2014-10-10 LAB — BASIC METABOLIC PANEL
Anion gap: 8 (ref 5–15)
BUN: 35 mg/dL — ABNORMAL HIGH (ref 6–20)
CHLORIDE: 97 mmol/L — AB (ref 101–111)
CO2: 29 mmol/L (ref 22–32)
CREATININE: 1.34 mg/dL — AB (ref 0.44–1.00)
Calcium: 8.5 mg/dL — ABNORMAL LOW (ref 8.9–10.3)
GFR calc non Af Amer: 40 mL/min — ABNORMAL LOW (ref >60)
GFR, EST AFRICAN AMERICAN: 46 mL/min — AB (ref >60)
Glucose, Bld: 256 mg/dL — ABNORMAL HIGH (ref 65–99)
POTASSIUM: 4.2 mmol/L (ref 3.5–5.1)
SODIUM: 134 mmol/L — AB (ref 135–145)

## 2014-10-10 LAB — TROPONIN I: TROPONIN I: 0.05 ng/mL — AB (ref <0.031)

## 2014-10-10 LAB — LACTIC ACID, PLASMA: Lactic Acid, Venous: 1.1 mmol/L (ref 0.5–2.0)

## 2014-10-10 MED ORDER — FUROSEMIDE 10 MG/ML IJ SOLN
80.0000 mg | Freq: Once | INTRAMUSCULAR | Status: AC
  Administered 2014-10-10: 80 mg via INTRAVENOUS
  Filled 2014-10-10: qty 8

## 2014-10-10 MED ORDER — ALBUTEROL SULFATE (2.5 MG/3ML) 0.083% IN NEBU
5.0000 mg | INHALATION_SOLUTION | Freq: Once | RESPIRATORY_TRACT | Status: AC
  Administered 2014-10-10: 5 mg via RESPIRATORY_TRACT
  Filled 2014-10-10: qty 6

## 2014-10-10 NOTE — ED Provider Notes (Addendum)
CSN: [id number]     Arrival date & time 10/10/14  2055 History  By signing my name below, I, Helane Gunther, attest that this documentation has been prepared under the direction and in the presence of Orpah Greek, MD. Electronically Signed: Helane Gunther, ED Scribe. 10/10/2014. 9:24 PM.    Chief Complaint  Patient presents with  . Shortness of Breath   The history is provided by the patient and a relative. No language interpreter was used.   HPI Comments: [name] [name] is a 69 y.o. female with a PMHx of CHF who presents to the Emergency Department complaining of worsening SOB onset 1 day ago. She reports associated n/v/d, productive cough (yellow sputum), fever (subjective, onset 3 days ago), bilateral leg pain, and congestion. Per daughter, pt has seen her PCP for this 3 days ago who gave her medications which have provided no relief. Daughter notes that since the visit, the pt's SOB has worsened. Pt states she is on 2L of oxygen at home but had it turned up to 4L today due to worsening SOB.   Past Medical History  Diagnosis Date  . Mixed hyperlipidemia   . Essential hypertension, benign   . Depression   . Cellulitis     a. Recurrent, bilateral legs  . Carotid artery disease (HCC)     a. Bilateral CEA; 50-60% bilateral ICA stenosis, 11/12  . PAD (peripheral artery disease) ([place])   . Suicide attempt ([place]) 08/2002  . TIA (transient ischemic attack)   . TMJ syndrome   . Herniated disc   . Ischemic cardiomyopathy     a. 11/2010 TEE: EF 40-45%.  . Critical lower limb ischemia   . Venous insufficiency   . Coronary atherosclerosis of native coronary artery     a. 11/2010 NSTEMI/CABG x 4: L-LAD; S-PL; S-OM; S-DX, by PVT.  [name] NSTEMI (non-ST elevated myocardial infarction) ([place]) 11/2010  . Pneumonia 2000's X 2  . Type 2 diabetes mellitus ([place])   . History of blood transfusion   . Migraine   . Stroke ([place]) 08/2009  . Arthritis   . Chronic back pain   . On home oxygen therapy    . Chronic diastolic heart failure [facility])    Past Surgical History  Procedure Laterality Date  . Appendectomy    . Cholecystectomy    . Abdominal hysterectomy    . Carotid endarterectomy Bilateral 2011    Bilateral - Dr. Kellie Simmering  . Wrist fracture surgery Left     "grafted bone from hip to wrist"  . Coronary artery bypass graft  11/24/2010    Procedure: CORONARY ARTERY BYPASS GRAFTING (CABG);  Surgeon: Tharon Aquas Adelene Idler, MD;  Location: [place];  Service: Open Heart Surgery;  Laterality: N/A;  Coronary Artery Bypass Graft times four on pump utilizing left internal mammary artery and bilateral greater saphenous veins harvested endoscopically, transesophageal echocardiogram   . Toe surgery Left     "put pin in 2nd toe"  . Lower extremity angiogram  09/08/2012; 10/06/2013    "found 100% blockage; unsuccessful attempt at crossing a chronic total occlusion of the left SFA in the setting of critical limb ischemia  . Dilation and curettage of uterus    . Tubal ligation    . Debridement toe Left     "nonhealing wound; 3rd digit  . Cardiac catheterization  11/2010  . Coronary angioplasty with stent placement  03/2013    "1"  . Colonoscopy w/ biopsies and polypectomy    .  Fracture surgery    . Femoral-popliteal bypass graft Left 10/07/2013    Procedure: LEFT FEMORAL-POPLITEAL ARTERY BYPASS GRAFT;  Surgeon: Angelia Mould, MD;  Location: [place];  Service: Vascular;  Laterality: Left;  . Intraoperative arteriogram Left 10/07/2013    Procedure: INTRA OPERATIVE ARTERIOGRAM LEFT LEG;  Surgeon: Angelia Mould, MD;  Location: [place];  Service: Vascular;  Laterality: Left;  . Endarterectomy popliteal Left 10/07/2013    Procedure: LEFT POPLITEAL ENDARTERECTOMY ;  Surgeon: Angelia Mould, MD;  Location: [place];  Service: Vascular;  Laterality: Left;  . Left and right heart catheterization with coronary/graft angiogram N/A 03/27/2013    Procedure: LEFT AND RIGHT HEART CATHETERIZATION WITH  Beatrix Fetters;  Surgeon: Burnell Blanks, MD;  Location: [facility] CATH LAB;  Service: Cardiovascular;  Laterality: N/A;  . Lower extremity angiogram Bilateral 09/08/2013    Procedure: LOWER EXTREMITY ANGIOGRAM;  Surgeon: [name], MD;  Location: [facility] CATH LAB;  Service: Cardiovascular;  Laterality: Bilateral;   Family History  Problem Relation Age of Onset  . Coronary artery disease Father     Died with MI age 54  . Heart disease Father   . Heart attack Father   . Diabetes type II Mother   . Hypertension Mother   . Diabetes Mother   . Heart disease Mother   . Hyperlipidemia Mother   . Heart failure Sister   . Cancer Sister   . Cancer Brother     Lung cancer  . Diabetes Daughter   . Hyperlipidemia Daughter   . Diabetes Son   . Hyperlipidemia Son    Social History  Substance Use Topics  . Smoking status: Former Smoker -- 3.00 packs/day for 50 years    Types: Cigarettes    Start date: 01/24/1956    Quit date: 10/09/2009  . Smokeless tobacco: Never Used  . Alcohol Use: 0.0 oz/week    0 Standard drinks or equivalent per week     Comment: 10/06/2013 "might have a drink q 2-3 months"   OB History    No data available     Review of Systems  Constitutional: Positive for fever.  HENT: Positive for congestion.   Respiratory: Positive for cough and shortness of breath.   Gastrointestinal: Positive for nausea, vomiting and diarrhea.  Musculoskeletal: Positive for myalgias.  All other systems reviewed and are negative.   Allergies  Other; Strawberry; Sulfa antibiotics; and Tape  Home Medications   Prior to Admission medications   Medication Sig Start Date End Date Taking? Authorizing Provider  acetaminophen (TYLENOL) 500 MG tablet Take 1,000 mg by mouth every 6 (six) hours as needed.   Yes Historical Provider, MD  amLODipine (NORVASC) 10 MG tablet Take 1 tablet (10 mg total) by mouth daily. 07/09/14  Yes Satira Sark, MD  aspirin 81 MG EC tablet Take 81  mg by mouth daily. 03/29/13  Yes Brittainy Erie Noe, PA-C  atorvastatin (LIPITOR) 40 MG tablet Take 40 mg by mouth daily at 6 PM. 03/29/13  Yes Brittainy Erie Noe, PA-C  carvedilol (COREG) 25 MG tablet Take 1 tablet (25 mg total) by mouth 2 (two) times daily with a meal. 09/15/14  Yes Satira Sark, MD  citalopram (CELEXA) 20 MG tablet Take 20 mg by mouth daily.   Yes Historical Provider, MD  clopidogrel (PLAVIX) 75 MG tablet Take 1 tablet (75 mg total) by mouth daily with breakfast. 03/16/14  Yes Satira Sark, MD  Docusate Calcium (STOOL SOFTENER PO)  Take 100 mg by mouth daily as needed (for constipation).    Yes Historical Provider, MD  gabapentin (NEURONTIN) 300 MG capsule Take 300 mg by mouth daily. 08/02/14  Yes Historical Provider, MD  glimepiride (AMARYL) 2 MG tablet Take 2 mg by mouth 2 (two) times daily.    Yes Historical Provider, MD  hydrALAZINE (APRESOLINE) 25 MG tablet Take 25 mg by mouth 3 (three) times daily.   Yes Historical Provider, MD  HYDROcodone-acetaminophen (NORCO/VICODIN) 5-325 MG per tablet Take 1 tablet by mouth every 6 (six) hours as needed for moderate pain. Patient taking differently: Take 0.5-1 tablets by mouth every 6 (six) hours as needed for moderate pain.  09/12/14  Yes Charlynne Cousins, MD  insulin aspart (NOVOLOG) 100 UNIT/ML injection Inject 0-8 Units into the skin 3 (three) times daily as needed for high blood sugar. >150 = 2 units, >200 = 4 units, >250 = 6 units, >300 = 8 units 03/29/13  Yes Brittainy M Simmons, PA-C  insulin detemir (LEVEMIR) 100 UNIT/ML injection Inject 22 Units into the skin at bedtime.  03/29/13  Yes Brittainy Erie Noe, PA-C  isosorbide dinitrate (ISORDIL) 30 MG tablet Take 15-30 mg by mouth 2 (two) times daily. Takes 15 mg in the 30 in the evening.   Yes Historical Provider, MD  isosorbide dinitrate (ISORDIL) 30 MG tablet Take 15-30 mg by mouth 2 (two) times daily. Takes 15 mg in the morning and 30 mg in the evening.   Yes Historical  Provider, MD  isosorbide mononitrate (IMDUR) 60 MG 24 hr tablet Take 1 tablet (60 mg total) by mouth daily. Patient taking differently: Take by mouth daily.  10/05/14  Yes Satira Sark, MD  lisinopril (PRINIVIL,ZESTRIL) 20 MG tablet Take 1 tablet (20 mg total) by mouth daily. 06/01/14  Yes Satira Sark, MD  Multiple Vitamin (MULTIVITAMIN WITH MINERALS) TABS tablet Take 1 tablet by mouth daily.   Yes Historical Provider, MD  Multiple Vitamins-Minerals (HAIR/SKIN/NAILS) TABS Take 1 tablet by mouth daily.   Yes Historical Provider, MD  potassium chloride (K-DUR) 10 MEQ tablet Take 1 tablet (10 mEq total) by mouth daily. 06/25/14  Yes Satira Sark, MD  torsemide (DEMADEX) 20 MG tablet Take 2 tablets (40 mg total) by mouth every morning. & 20 mg in the evening. You may take an extra tablet in the evening if your weight increases 3 lbs in a day until your weight is back down to around 195 lbs, then resume previous dosage Patient taking differently: Take 20-40 mg by mouth 2 (two) times daily. Every day patient takes 2 tablets in the morning and 1 tablet in the evening. May take an extra tablet in the evening if your weight increases 3 lbs in a day until your weight is back down to around 195 lbs, then resume previous dosage 10/02/14  Yes Satira Sark, MD  Umeclidinium-Vilanterol 62.5-25 MCG/INH AEPB Inhale 1 application into the lungs daily.    Yes Historical Provider, MD  albuterol (PROVENTIL HFA;VENTOLIN HFA) 108 (90 BASE) MCG/ACT inhaler Inhale 2 puffs into the lungs daily as needed for wheezing or shortness of breath.     Historical Provider, MD  nitroGLYCERIN (NITROSTAT) 0.4 MG SL tablet Place 1 tablet (0.4 mg total) under the tongue every 5 (five) minutes as needed for chest pain. Up to 3 doses. If no relief after 3rd dose, proceed to the ED for an evaluation 09/30/14   Satira Sark, MD   BP 157/41 mmHg  Pulse  63  Temp(Src) 98.7 F (37.1 C)  Resp 27  Ht _0  (1.676 m)  Wt 200  lb (90.719 kg)  BMI 32.30 kg/m2  SpO2 88% Physical Exam  Constitutional: She is oriented to person, place, and time. She appears well-developed and well-nourished. No distress.  HENT:  Head: Normocephalic and atraumatic.  Right Ear: Hearing normal.  Left Ear: Hearing normal.  Nose: Nose normal.  Mouth/Throat: Oropharynx is clear and moist and mucous membranes are normal.  Eyes: Conjunctivae and EOM are normal. Pupils are equal, round, and reactive to light.  Neck: Normal range of motion. Neck supple.  Cardiovascular: Regular rhythm, S1 normal and S2 normal.  Exam reveals no gallop and no friction rub.   No murmur heard. Pulmonary/Chest: Effort normal. She exhibits no tenderness.  Significantly decreased to absent breath sounds right mid- to right lower lung  Abdominal: Soft. Normal appearance and bowel sounds are normal. There is no hepatosplenomegaly. There is no tenderness. There is no rebound, no guarding, no tenderness at McBurney's point and negative Murphy's sign. No hernia.  Musculoskeletal: Normal range of motion. She exhibits edema.  Severe pitting edema to both legs  Neurological: She is alert and oriented to person, place, and time. She has normal strength. No cranial nerve deficit or sensory deficit. Coordination normal. GCS eye subscore is 4. GCS verbal subscore is 5. GCS motor subscore is 6.  Skin: Skin is warm, dry and intact. No rash noted. No cyanosis.  Psychiatric: She has a normal mood and affect. Her speech is normal and behavior is normal. Thought content normal.  Nursing note and vitals reviewed.   ED Course  Procedures  DIAGNOSTIC STUDIES: Oxygen Saturation is 83% on 4 L/min [place], low by my interpretation.    COORDINATION OF CARE: 9:04 PM - Discussed plans to order diagnostic studies and imaging. Pt advised of plan for treatment and pt agrees.  Labs Review Labs Reviewed  BASIC METABOLIC PANEL - Abnormal; Notable for the following:    Sodium 134 (*)     Chloride 97 (*)    Glucose, Bld 256 (*)    BUN 35 (*)    Creatinine, Ser 1.34 (*)    Calcium 8.5 (*)    GFR calc non Af Amer 40 (*)    GFR calc Af Amer 46 (*)    All other components within normal limits  CBC WITH DIFFERENTIAL/PLATELET - Abnormal; Notable for the following:    WBC 11.2 (*)    RBC 3.25 (*)    Hemoglobin 9.3 (*)    HCT 29.2 (*)    Neutro Abs 9.8 (*)    All other components within normal limits  TROPONIN I - Abnormal; Notable for the following:    Troponin I 0.05 (*)    All other components within normal limits  BRAIN NATRIURETIC PEPTIDE - Abnormal; Notable for the following:    B Natriuretic Peptide 914.0 (*)    All other components within normal limits  BLOOD GAS, ARTERIAL - Abnormal; Notable for the following:    pO2, Arterial 68.1 (*)    Bicarbonate 28.5 (*)    Acid-Base Excess 4.7 (*)    All other components within normal limits  CULTURE, BLOOD (ROUTINE X 2)  CULTURE, BLOOD (ROUTINE X 2)  LACTIC ACID, PLASMA  LACTIC ACID, PLASMA  I-STAT CG4 LACTIC ACID, ED    Imaging Review Dg Chest 2 View  10/10/2014   CLINICAL DATA:  Shortness of breath since yesterday  EXAM: CHEST  2  VIEW  COMPARISON:  09/08/2014  FINDINGS: Large right pleural effusion obscuring the majority of the right lung.  Cardiomegaly and mild pulmonary edema.  Status post CABG.  IMPRESSION: 1. Large right pleural effusion obscuring most of the right lung. 2. Cardiomegaly and mild pulmonary edema.   Electronically Signed   By: Monte Fantasia M.D.   On: 10/10/2014 21:58   I have personally reviewed and evaluated these images and lab results as part of my medical decision-making.   EKG Interpretation   Date/Time:  Saturday October 10 2014 21:06:17 EDT Ventricular Rate:  65 PR Interval:    QRS Duration: 98 QT Interval:  436 QTC Calculation: 453 R Axis:   111 Text Interpretation:  Sinus bradycardia Right axis deviation Anteroseptal  infarct, old Nonspecific ST and T wave abnormality Confirmed  by Sybrina Laning   MD, Herson Prichard [phone]) on 10/10/2014 9:25:11 PM      MDM   Final diagnoses:  Acute on chronic diastolic congestive heart failure (HCC)  Pleural effusion, right  Acute respiratory failure with hypoxia [facility])   Patient presents to the ER for evaluation of difficulty breathing. Patient has a history of congestive heart failure. She was hospitalized at [facility] one month ago for acute diastolic congestive heart failure with right pleural effusion. She had right thoracentesis of 1.5 L during the hospitalization and had large volume diuresis before discharge.  At arrival today she is hypoxic. She does not appear to be in any significant distress, however. She is requiring significant oxygen supplementation to keep her oxygen saturations up. Blood gas confirms hypoxia without hypercarbia. Chest x-ray shows large right-sided pleural effusion has reaccumulated. There is also evidence of acute decompensation of heart failure. Patient administered Lasix 80 mg IV and will be placed on BiPAP. She'll require hospitalization for further management.  Discussed with Dr. Marlowe Sax, on call for cardiology. Agrees with diuresis and will see patient for consultation upon arrival at [facility], but asked for medicine admission because of the pleural effusion.  CRITICAL CARE Performed by: Orpah Greek   Total critical care time: 28mn  Critical care time was exclusive of separately billable procedures and treating other patients.  Critical care was necessary to treat or prevent imminent or life-threatening deterioration.  Critical care was time spent personally by me on the following activities: development of treatment plan with patient and/or surrogate as well as nursing, discussions with consultants, evaluation of patient's response to treatment, examination of patient, obtaining history from patient or surrogate, ordering and performing treatments and interventions, ordering and review of  laboratory studies, ordering and review of radiographic studies, pulse oximetry and re-evaluation of patient's condition.    I personally performed the services described in this documentation, which was scribed in my presence. The recorded information has been reviewed and is accurate.    COrpah Greek MD 10/10/14 [id number] COrpah Greek MD 10/11/14 [phone]

## 2014-10-10 NOTE — ED Notes (Signed)
Pt c/o SOB since yesterday with yellow sputum, n/v.  Pt on continuous 02 2l [place] at home.  Patient had turned it up to 4L today due to increased SOB. Pt alert/oriented. Daughter at bedside. Congestion worse per daughter. Pt slightly pale. EDP at room waiting when brought pt back. Patient was able to get from w/c to stretcher.

## 2014-10-11 ENCOUNTER — Inpatient Hospital Stay (HOSPITAL_COMMUNITY): Payer: Medicare Other

## 2014-10-11 DIAGNOSIS — J9621 Acute and chronic respiratory failure with hypoxia: Secondary | ICD-10-CM

## 2014-10-11 DIAGNOSIS — I5043 Acute on chronic combined systolic (congestive) and diastolic (congestive) heart failure: Secondary | ICD-10-CM

## 2014-10-11 LAB — PROTEIN, BODY FLUID: Total protein, fluid: <3 g/dL

## 2014-10-11 LAB — CREATININE, SERUM
CREATININE: 1.28 mg/dL — AB (ref 0.44–1.00)
GFR, EST AFRICAN AMERICAN: 49 mL/min — AB (ref >60)
GFR, EST NON AFRICAN AMERICAN: 42 mL/min — AB (ref >60)

## 2014-10-11 LAB — CBC
HEMATOCRIT: 28.3 % — AB (ref 36.0–46.0)
Hemoglobin: 8.9 g/dL — ABNORMAL LOW (ref 12.0–15.0)
MCH: 28.1 pg (ref 26.0–34.0)
MCHC: 31.4 g/dL (ref 30.0–36.0)
MCV: 89.3 fL (ref 78.0–100.0)
PLATELETS: 175 10*3/uL (ref 150–400)
RBC: 3.17 MIL/uL — ABNORMAL LOW (ref 3.87–5.11)
RDW: 15.4 % (ref 11.5–15.5)
WBC: 9.7 10*3/uL (ref 4.0–10.5)

## 2014-10-11 LAB — BLOOD GAS, ARTERIAL
ACID-BASE EXCESS: 5.2 mmol/L — AB (ref 0.0–2.0)
BICARBONATE: 29 meq/L — AB (ref 20.0–24.0)
DELIVERY SYSTEMS: POSITIVE
Drawn by: 105551
EXPIRATORY PAP: 6
FIO2: 0.7
Inspiratory PAP: 12
O2 Saturation: 95.8 %
PO2 ART: 95.8 mmHg (ref 80.0–100.0)
Patient temperature: 98.6
RATE: 8 resp/min
pCO2 arterial: 44.4 mmHg (ref 35.0–45.0)
pH, Arterial: 7.436 (ref 7.350–7.450)

## 2014-10-11 LAB — GRAM STAIN

## 2014-10-11 LAB — LACTATE DEHYDROGENASE, PLEURAL OR PERITONEAL FLUID: LD FL: 81 U/L — AB (ref 3–23)

## 2014-10-11 LAB — URINALYSIS, ROUTINE W REFLEX MICROSCOPIC
Bilirubin Urine: NEGATIVE
Glucose, UA: NEGATIVE mg/dL
Ketones, ur: NEGATIVE mg/dL
Leukocytes, UA: NEGATIVE
NITRITE: NEGATIVE
PH: 6 (ref 5.0–8.0)
Protein, ur: 30 mg/dL — AB
SPECIFIC GRAVITY, URINE: 1.01 (ref 1.005–1.030)
UROBILINOGEN UA: 0.2 mg/dL (ref 0.0–1.0)

## 2014-10-11 LAB — URINE MICROSCOPIC-ADD ON

## 2014-10-11 LAB — BODY FLUID CELL COUNT WITH DIFFERENTIAL
Lymphs, Fluid: 23 %
Monocyte-Macrophage-Serous Fluid: 22 % — ABNORMAL LOW (ref 50–90)
NEUTROPHIL FLUID: 55 % — AB (ref 0–25)
WBC FLUID: 614 uL (ref 0–1000)

## 2014-10-11 LAB — GLUCOSE, SEROUS FLUID: GLUCOSE FL: 177 mg/dL

## 2014-10-11 LAB — GLUCOSE, CAPILLARY
GLUCOSE-CAPILLARY: 107 mg/dL — AB (ref 65–99)
Glucose-Capillary: 165 mg/dL — ABNORMAL HIGH (ref 65–99)
Glucose-Capillary: 118 mg/dL — ABNORMAL HIGH (ref 65–99)
Glucose-Capillary: 107 mg/dL — ABNORMAL HIGH (ref 65–99)
Glucose-Capillary: 129 mg/dL — ABNORMAL HIGH (ref 65–99)

## 2014-10-11 LAB — LACTIC ACID, PLASMA: Lactic Acid, Venous: 0.7 mmol/L (ref 0.5–2.0)

## 2014-10-11 LAB — TROPONIN I
TROPONIN I: 0.06 ng/mL — AB (ref <0.031)
TROPONIN I: 0.1 ng/mL — AB (ref <0.031)
Troponin I: 0.13 ng/mL — ABNORMAL HIGH (ref <0.031)

## 2014-10-11 LAB — MRSA PCR SCREENING: MRSA BY PCR: NEGATIVE

## 2014-10-11 MED ORDER — GABAPENTIN 300 MG PO CAPS
300.0000 mg | ORAL_CAPSULE | Freq: Every day | ORAL | Status: DC
  Administered 2014-10-11 – 2014-10-12 (×2): 300 mg via ORAL
  Filled 2014-10-11 (×2): qty 1

## 2014-10-11 MED ORDER — INSULIN ASPART 100 UNIT/ML ~~LOC~~ SOLN
0.0000 [IU] | SUBCUTANEOUS | Status: DC
  Administered 2014-10-11: 2 [IU] via SUBCUTANEOUS
  Administered 2014-10-12: 1 [IU] via SUBCUTANEOUS

## 2014-10-11 MED ORDER — ALBUTEROL SULFATE (2.5 MG/3ML) 0.083% IN NEBU: 3.0000 mL | INHALATION_SOLUTION | Freq: Four times a day (QID) | RESPIRATORY_TRACT | Status: DC | PRN

## 2014-10-11 MED ORDER — NITROGLYCERIN 0.4 MG SL SUBL: 0.4000 mg | SUBLINGUAL_TABLET | SUBLINGUAL | Status: DC | PRN

## 2014-10-11 MED ORDER — HYDRALAZINE HCL 25 MG PO TABS
25.0000 mg | ORAL_TABLET | Freq: Three times a day (TID) | ORAL | Status: DC
  Administered 2014-10-11 – 2014-10-19 (×24): 25 mg via ORAL
  Filled 2014-10-11 (×24): qty 1

## 2014-10-11 MED ORDER — AMLODIPINE BESYLATE 10 MG PO TABS
10.0000 mg | ORAL_TABLET | Freq: Every day | ORAL | Status: DC
  Administered 2014-10-11 – 2014-10-19 (×9): 10 mg via ORAL
  Filled 2014-10-11 (×9): qty 1

## 2014-10-11 MED ORDER — LISINOPRIL 20 MG PO TABS
20.0000 mg | ORAL_TABLET | Freq: Every day | ORAL | Status: DC
  Administered 2014-10-11 – 2014-10-19 (×9): 20 mg via ORAL
  Filled 2014-10-11 (×9): qty 1

## 2014-10-11 MED ORDER — CLOPIDOGREL BISULFATE 75 MG PO TABS
75.0000 mg | ORAL_TABLET | Freq: Every day | ORAL | Status: DC
  Administered 2014-10-11 – 2014-10-13 (×3): 75 mg via ORAL
  Filled 2014-10-11 (×3): qty 1

## 2014-10-11 MED ORDER — ISOSORBIDE DINITRATE 10 MG PO TABS
30.0000 mg | ORAL_TABLET | Freq: Every evening | ORAL | Status: DC
  Administered 2014-10-11: 30 mg via ORAL
  Filled 2014-10-11: qty 3

## 2014-10-11 MED ORDER — HYDROCODONE-ACETAMINOPHEN 5-325 MG PO TABS
1.0000 | ORAL_TABLET | Freq: Four times a day (QID) | ORAL | Status: DC | PRN
  Administered 2014-10-11 – 2014-10-12 (×4): 1 via ORAL
  Filled 2014-10-11 (×4): qty 1

## 2014-10-11 MED ORDER — POTASSIUM CHLORIDE ER 10 MEQ PO TBCR
10.0000 meq | EXTENDED_RELEASE_TABLET | Freq: Every day | ORAL | Status: DC
  Administered 2014-10-11 – 2014-10-12 (×2): 10 meq via ORAL
  Filled 2014-10-11 (×4): qty 1

## 2014-10-11 MED ORDER — ONDANSETRON HCL 4 MG/2ML IJ SOLN
4.0000 mg | Freq: Four times a day (QID) | INTRAMUSCULAR | Status: DC | PRN
  Administered 2014-10-12 – 2014-10-19 (×5): 4 mg via INTRAVENOUS
  Filled 2014-10-11 (×6): qty 2

## 2014-10-11 MED ORDER — CITALOPRAM HYDROBROMIDE 20 MG PO TABS
20.0000 mg | ORAL_TABLET | Freq: Every day | ORAL | Status: DC
Start: 2014-10-11 — End: 2014-10-19
  Administered 2014-10-11 – 2014-10-19 (×9): 20 mg via ORAL
  Filled 2014-10-11 (×9): qty 1

## 2014-10-11 MED ORDER — LIDOCAINE HCL (PF) 1 % IJ SOLN
INTRAMUSCULAR | Status: AC
  Filled 2014-10-11: qty 10

## 2014-10-11 MED ORDER — CARVEDILOL 25 MG PO TABS
25.0000 mg | ORAL_TABLET | Freq: Two times a day (BID) | ORAL | Status: DC
  Administered 2014-10-11 – 2014-10-12 (×3): 25 mg via ORAL
  Filled 2014-10-11 (×3): qty 1

## 2014-10-11 MED ORDER — SODIUM CHLORIDE 0.9 % IJ SOLN
3.0000 mL | Freq: Two times a day (BID) | INTRAMUSCULAR | Status: DC
  Administered 2014-10-11 – 2014-10-19 (×14): 3 mL via INTRAVENOUS

## 2014-10-11 MED ORDER — ENOXAPARIN SODIUM 40 MG/0.4ML ~~LOC~~ SOLN
40.0000 mg | SUBCUTANEOUS | Status: DC
  Administered 2014-10-11 – 2014-10-13 (×3): 40 mg via SUBCUTANEOUS
  Filled 2014-10-11 (×3): qty 0.4

## 2014-10-11 MED ORDER — ATORVASTATIN CALCIUM 40 MG PO TABS
40.0000 mg | ORAL_TABLET | Freq: Every day | ORAL | Status: DC
  Administered 2014-10-11 – 2014-10-18 (×8): 40 mg via ORAL
  Filled 2014-10-11 (×8): qty 1

## 2014-10-11 MED ORDER — INSULIN DETEMIR 100 UNIT/ML ~~LOC~~ SOLN
22.0000 [IU] | Freq: Every day | SUBCUTANEOUS | Status: DC
  Administered 2014-10-11: 20 [IU] via SUBCUTANEOUS
  Filled 2014-10-11 (×2): qty 0.22

## 2014-10-11 MED ORDER — CHLORHEXIDINE GLUCONATE 0.12 % MT SOLN
15.0000 mL | Freq: Two times a day (BID) | OROMUCOSAL | Status: DC
  Administered 2014-10-11 – 2014-10-19 (×14): 15 mL via OROMUCOSAL
  Filled 2014-10-11 (×17): qty 15

## 2014-10-11 MED ORDER — CETYLPYRIDINIUM CHLORIDE 0.05 % MT LIQD
7.0000 mL | Freq: Two times a day (BID) | OROMUCOSAL | Status: DC
  Administered 2014-10-11 – 2014-10-19 (×15): 7 mL via OROMUCOSAL

## 2014-10-11 MED ORDER — ADULT MULTIVITAMIN W/MINERALS CH
1.0000 | ORAL_TABLET | Freq: Every day | ORAL | Status: DC
  Administered 2014-10-11 – 2014-10-19 (×9): 1 via ORAL
  Filled 2014-10-11 (×10): qty 1

## 2014-10-11 MED ORDER — ASPIRIN EC 81 MG PO TBEC
81.0000 mg | DELAYED_RELEASE_TABLET | Freq: Every day | ORAL | Status: DC
  Administered 2014-10-11 – 2014-10-19 (×9): 81 mg via ORAL
  Filled 2014-10-11 (×12): qty 1

## 2014-10-11 MED ORDER — ISOSORBIDE MONONITRATE ER 30 MG PO TB24
45.0000 mg | ORAL_TABLET | Freq: Every day | ORAL | Status: DC
  Administered 2014-10-11 – 2014-10-19 (×9): 45 mg via ORAL
  Filled 2014-10-11 (×9): qty 2

## 2014-10-11 MED ORDER — FUROSEMIDE 10 MG/ML IJ SOLN
80.0000 mg | Freq: Two times a day (BID) | INTRAMUSCULAR | Status: DC
  Administered 2014-10-11 – 2014-10-12 (×3): 80 mg via INTRAVENOUS
  Filled 2014-10-11 (×4): qty 8

## 2014-10-11 MED ORDER — SODIUM CHLORIDE 0.9 % IV SOLN: 250.0000 mL | INTRAVENOUS | Status: DC | PRN

## 2014-10-11 MED ORDER — ISOSORBIDE DINITRATE 10 MG PO TABS
15.0000 mg | ORAL_TABLET | Freq: Every day | ORAL | Status: DC
  Administered 2014-10-11: 15 mg via ORAL
  Filled 2014-10-11: qty 2

## 2014-10-11 MED ORDER — SODIUM CHLORIDE 0.9 % IJ SOLN: 3.0000 mL | INTRAMUSCULAR | Status: DC | PRN

## 2014-10-11 MED ORDER — ACETAMINOPHEN 325 MG PO TABS
650.0000 mg | ORAL_TABLET | ORAL | Status: DC | PRN
  Administered 2014-10-11 – 2014-10-13 (×2): 650 mg via ORAL
  Filled 2014-10-11 (×2): qty 2

## 2014-10-11 MED ORDER — UMECLIDINIUM-VILANTEROL 62.5-25 MCG/INH IN AEPB: 1.0000 "application " | INHALATION_SPRAY | Freq: Every day | RESPIRATORY_TRACT | Status: DC

## 2014-10-11 MED ORDER — HAIR/SKIN/NAILS PO TABS: 1.0000 | ORAL_TABLET | Freq: Every day | ORAL | Status: DC

## 2014-10-11 NOTE — Progress Notes (Signed)
Dr Patel at bedside  ?

## 2014-10-11 NOTE — Plan of Care (Signed)
[facility] PROGRESS NOTE  [name] [name]  XEN:[id number]  DOB: Sep 23, 1945  DOS: 10/11/2014  PCP: Glenda Chroman., MD  Subjective and Objective: Pt arrived from [facility],  No chest pain, and her SOB has improved. No nausea and tolerating BIPAP well. C/o mild leg pain  Data Reviewed: CXR large left pleural effusion  Assessment and Plan: Note from Dr Shanon Brow, reviewed, Cardiology fellow notified of the pt arrival. Thoracentesis orders placed as planned. Diet order changed from NPO to NPO except medication. Rest of the orders carried forward appropriately.  Author: Berle Mull, MD [facility] Pager: [phone] 10/11/2014 5:19 AM   If 7PM-7AM, please contact night-coverage at [email or web address], password [facility]

## 2014-10-11 NOTE — Consult Note (Signed)
Seen and examined.  Agree with prior plan for thoracentesis and diuresis which should help to improve her respiratory status.  Should weigh daily for updates on titration of lasix dose.    Will Camnitz 10/11/2014 2:05 PM

## 2014-10-11 NOTE — H&P (Signed)
PCP:   Glenda Chroman., MD   Chief Complaint:  sob  HPI: 69 yo female h/o chronic hypoxic resp failure on home oxygen, chronic combined chf, hospitalized last month for chf and right pleural effusion which required thoracentesis deemed to be due to heart failre has been doing well.  Today she started having worsening sob.  Denies fevers. No chest pain.  Her le are not more swollen than normal, its about the same as it always has been.  She can provide little history due to her on bipap in the ED, she is mentating normally but can only answer yes/no questions.  She says she has been taking her meds.  Her daughter is present and provides most of the history.  Pt referred for admission for acute chf exacerbation,.  Pt does confirm with me she is DNR.  Review of Systems:  Positive and negative as per HPI otherwise all other systems are negative, positive for PND, orthopnea  Past Medical History: Past Medical History  Diagnosis Date  . Mixed hyperlipidemia   . Essential hypertension, benign   . Depression   . Cellulitis     a. Recurrent, bilateral legs  . Carotid artery disease (HCC)     a. Bilateral CEA; 50-60% bilateral ICA stenosis, 11/12  . PAD (peripheral artery disease) ([place])   . Suicide attempt ([place]) 08/2002  . TIA (transient ischemic attack)   . TMJ syndrome   . Herniated disc   . Ischemic cardiomyopathy     a. 11/2010 TEE: EF 40-45%.  . Critical lower limb ischemia   . Venous insufficiency   . Coronary atherosclerosis of native coronary artery     a. 11/2010 NSTEMI/CABG x 4: L-LAD; S-PL; S-OM; S-DX, by PVT.  [name] NSTEMI (non-ST elevated myocardial infarction) ([place]) 11/2010  . Pneumonia 2000's X 2  . Type 2 diabetes mellitus ([place])   . History of blood transfusion   . Migraine   . Stroke ([place]) 08/2009  . Arthritis   . Chronic back pain   . On home oxygen therapy   . Chronic diastolic heart failure [facility])    Past Surgical History  Procedure Laterality Date  . Appendectomy     . Cholecystectomy    . Abdominal hysterectomy    . Carotid endarterectomy Bilateral 2011    Bilateral - Dr. Kellie Simmering  . Wrist fracture surgery Left     "grafted bone from hip to wrist"  . Coronary artery bypass graft  11/24/2010    Procedure: CORONARY ARTERY BYPASS GRAFTING (CABG);  Surgeon: Tharon Aquas Adelene Idler, MD;  Location: [place];  Service: Open Heart Surgery;  Laterality: N/A;  Coronary Artery Bypass Graft times four on pump utilizing left internal mammary artery and bilateral greater saphenous veins harvested endoscopically, transesophageal echocardiogram   . Toe surgery Left     "put pin in 2nd toe"  . Lower extremity angiogram  09/08/2012; 10/06/2013    "found 100% blockage; unsuccessful attempt at crossing a chronic total occlusion of the left SFA in the setting of critical limb ischemia  . Dilation and curettage of uterus    . Tubal ligation    . Debridement toe Left     "nonhealing wound; 3rd digit  . Cardiac catheterization  11/2010  . Coronary angioplasty with stent placement  03/2013    "1"  . Colonoscopy w/ biopsies and polypectomy    . Fracture surgery    . Femoral-popliteal bypass graft Left 10/07/2013    Procedure: LEFT FEMORAL-POPLITEAL ARTERY  BYPASS GRAFT;  Surgeon: Angelia Mould, MD;  Location: [place];  Service: Vascular;  Laterality: Left;  . Intraoperative arteriogram Left 10/07/2013    Procedure: INTRA OPERATIVE ARTERIOGRAM LEFT LEG;  Surgeon: Angelia Mould, MD;  Location: [place];  Service: Vascular;  Laterality: Left;  . Endarterectomy popliteal Left 10/07/2013    Procedure: LEFT POPLITEAL ENDARTERECTOMY ;  Surgeon: Angelia Mould, MD;  Location: [place];  Service: Vascular;  Laterality: Left;  . Left and right heart catheterization with coronary/graft angiogram N/A 03/27/2013    Procedure: LEFT AND RIGHT HEART CATHETERIZATION WITH Beatrix Fetters;  Surgeon: Burnell Blanks, MD;  Location[facility] C[facility] LAB;  Service: Cardiovascular;   Laterality: N/A;  . Lower extremity angiogram Bilateral 09/08/2013    Procedure: LOWER EXTREMITY ANGIOGRAM;  Surgeon: [name], MD;  Location: [facility] CATH LAB;  Service: Cardiovascular;  Laterality: Bilateral;    Medications: Prior to Admission medications   Medication Sig Start Date End Date Taking? Authorizing Provider  acetaminophen (TYLENOL) 500 MG tablet Take 1,000 mg by mouth every 6 (six) hours as needed.   Yes Historical Provider, MD  amLODipine (NORVASC) 10 MG tablet Take 1 tablet (10 mg total) by mouth daily. 07/09/14  Yes Satira Sark, MD  aspirin 81 MG EC tablet Take 81 mg by mouth daily. 03/29/13  Yes Brittainy Erie Noe, PA-C  atorvastatin (LIPITOR) 40 MG tablet Take 40 mg by mouth daily at 6 PM. 03/29/13  Yes Brittainy Erie Noe, PA-C  carvedilol (COREG) 25 MG tablet Take 1 tablet (25 mg total) by mouth 2 (two) times daily with a meal. 09/15/14  Yes Satira Sark, MD  citalopram (CELEXA) 20 MG tablet Take 20 mg by mouth daily.   Yes Historical Provider, MD  clopidogrel (PLAVIX) 75 MG tablet Take 1 tablet (75 mg total) by mouth daily with breakfast. 03/16/14  Yes Satira Sark, MD  Docusate Calcium (STOOL SOFTENER PO) Take 100 mg by mouth daily as needed (for constipation).    Yes Historical Provider, MD  gabapentin (NEURONTIN) 300 MG capsule Take 300 mg by mouth daily. 08/02/14  Yes Historical Provider, MD  glimepiride (AMARYL) 2 MG tablet Take 2 mg by mouth 2 (two) times daily.    Yes Historical Provider, MD  hydrALAZINE (APRESOLINE) 25 MG tablet Take 25 mg by mouth 3 (three) times daily.   Yes Historical Provider, MD  HYDROcodone-acetaminophen (NORCO/VICODIN) 5-325 MG per tablet Take 1 tablet by mouth every 6 (six) hours as needed for moderate pain. Patient taking differently: Take 0.5-1 tablets by mouth every 6 (six) hours as needed for moderate pain.  09/12/14  Yes Charlynne Cousins, MD  insulin aspart (NOVOLOG) 100 UNIT/ML injection Inject 0-8 Units into the skin 3  (three) times daily as needed for high blood sugar. >150 = 2 units, >200 = 4 units, >250 = 6 units, >300 = 8 units 03/29/13  Yes Brittainy M Simmons, PA-C  insulin detemir (LEVEMIR) 100 UNIT/ML injection Inject 22 Units into the skin at bedtime.  03/29/13  Yes Brittainy Erie Noe, PA-C  isosorbide dinitrate (ISORDIL) 30 MG tablet Take 15-30 mg by mouth 2 (two) times daily. Takes 15 mg in the 30 in the evening.   Yes Historical Provider, MD  isosorbide dinitrate (ISORDIL) 30 MG tablet Take 15-30 mg by mouth 2 (two) times daily. Takes 15 mg in the morning and 30 mg in the evening.   Yes Historical Provider, MD  isosorbide mononitrate (IMDUR) 60 MG 24 hr  tablet Take 1 tablet (60 mg total) by mouth daily. Patient taking differently: Take by mouth daily.  10/05/14  Yes Satira Sark, MD  lisinopril (PRINIVIL,ZESTRIL) 20 MG tablet Take 1 tablet (20 mg total) by mouth daily. 06/01/14  Yes Satira Sark, MD  Multiple Vitamin (MULTIVITAMIN WITH MINERALS) TABS tablet Take 1 tablet by mouth daily.   Yes Historical Provider, MD  Multiple Vitamins-Minerals (HAIR/SKIN/NAILS) TABS Take 1 tablet by mouth daily.   Yes Historical Provider, MD  potassium chloride (K-DUR) 10 MEQ tablet Take 1 tablet (10 mEq total) by mouth daily. 06/25/14  Yes Satira Sark, MD  torsemide (DEMADEX) 20 MG tablet Take 2 tablets (40 mg total) by mouth every morning. & 20 mg in the evening. You may take an extra tablet in the evening if your weight increases 3 lbs in a day until your weight is back down to around 195 lbs, then resume previous dosage Patient taking differently: Take 20-40 mg by mouth 2 (two) times daily. Every day patient takes 2 tablets in the morning and 1 tablet in the evening. May take an extra tablet in the evening if your weight increases 3 lbs in a day until your weight is back down to around 195 lbs, then resume previous dosage 10/02/14  Yes Satira Sark, MD  Umeclidinium-Vilanterol 62.5-25 MCG/INH AEPB Inhale  1 application into the lungs daily.    Yes Historical Provider, MD  albuterol (PROVENTIL HFA;VENTOLIN HFA) 108 (90 BASE) MCG/ACT inhaler Inhale 2 puffs into the lungs daily as needed for wheezing or shortness of breath.     Historical Provider, MD  nitroGLYCERIN (NITROSTAT) 0.4 MG SL tablet Place 1 tablet (0.4 mg total) under the tongue every 5 (five) minutes as needed for chest pain. Up to 3 doses. If no relief after 3rd dose, proceed to the ED for an evaluation 09/30/14   Satira Sark, MD    Allergies:   Allergies  Allergen Reactions  . Other Shortness Of Breath, Rash and Other (See Comments)    All berries   . Strawberry Hives, Shortness Of Breath and Rash  . Sulfa Antibiotics Swelling    Bodily Swelling  . Tape Other (See Comments)    Tears skin.  Please use "paper" tape only.    Social History:  reports that she quit smoking about 5 years ago. Her smoking use included Cigarettes. She started smoking about 58 years ago. She has a 150 pack-year smoking history. She has never used smokeless tobacco. She reports that she drinks alcohol. She reports that she uses illicit drugs (Marijuana).  Family History: Family History  Problem Relation Age of Onset  . Coronary artery disease Father     Died with MI age 11  . Heart disease Father   . Heart attack Father   . Diabetes type II Mother   . Hypertension Mother   . Diabetes Mother   . Heart disease Mother   . Hyperlipidemia Mother   . Heart failure Sister   . Cancer Sister   . Cancer Brother     Lung cancer  . Diabetes Daughter   . Hyperlipidemia Daughter   . Diabetes Son   . Hyperlipidemia Son     Physical Exam: Filed Vitals:   10/10/14 2105 10/10/14 2130 10/10/14 2200 10/11/14 0013  BP: 154/53 140/50 157/41 158/52  Pulse: 66 60 63 60  Temp: 98.7 F (37.1 C)     Resp: _0 Height: _1  (1.676  m)     Weight: 90.719 kg (200 lb)     SpO2: 83% 86% 88% 98%   General appearance: alert, cooperative and  moderate distress Head: Normocephalic, without obvious abnormality, atraumatic Eyes: negative Nose: Nares normal. Septum midline. Mucosa normal. No drainage or sinus tenderness. Neck: no JVD and supple, symmetrical, trachea midline Lungs: diminished breath sounds RLL and RML Heart: regular rate and rhythm, S1, S2 normal, no murmur, click, rub or gallop Abdomen: soft, non-tender; bowel sounds normal; no masses,  no organomegaly Extremities: edema 1+ pitting ble edema Pulses: 2+ and symmetric Skin: Skin color, texture, turgor normal. No rashes or lesions Neurologic: Grossly normal   Labs on Admission:   Recent Labs  10/10/14 2153  NA 134*  K 4.2  CL 97*  CO2 29  GLUCOSE 256*  BUN 35*  CREATININE 1.34*  CALCIUM 8.5*    Recent Labs  10/10/14 2153  WBC 11.2*  NEUTROABS 9.8*  HGB 9.3*  HCT 29.2*  MCV 89.8  PLT 190    Recent Labs  10/10/14 2153  TROPONINI 0.05*   Radiological Exams on Admission: Dg Chest 2 View  10/10/2014   CLINICAL DATA:  Shortness of breath since yesterday  EXAM: CHEST  2 VIEW  COMPARISON:  09/08/2014  FINDINGS: Large right pleural effusion obscuring the majority of the right lung.  Cardiomegaly and mild pulmonary edema.  Status post CABG.  IMPRESSION: 1. Large right pleural effusion obscuring most of the right lung. 2. Cardiomegaly and mild pulmonary edema.   Electronically Signed   By: Monte Fantasia M.D.   On: 10/10/2014 21:58   Old chart reviewed ekg reviewed cxr reviewed Case discussed with dr Waverly Ferrari  Assessment/Plan  69 yo female with acute on chronic respiratory failure secondary to acute on chronic chf exacerbation  Principal Problem:   Acute on chronic combined systolic and diastolic heart failure ([place])-  Place on lasix 13m iv q 12 hours.  chf pathway.  Cardiology at [facility] called and will see on arrival to stepdown there.  Continue bipap for now, may need thoracentesis again in the am.  Cont plavix/aspirin.  On long acting nitrates,  cont this.  May need lasix drip, defer to cardiology service, foley cath has been placed has not diuresed much since lasix 81miv given in ED.    Active Problems:   Hypertension - cont home meds   PAD (peripheral artery disease) (HCC)   Diabetes mellitus type II, uncontrolled ([place] cont lantus, place on ssi   Acute on chronic respiratory failure with [place] ([place] due to [place]f, bipap at this time   [place]estive [place]  As above  Admit to stepdown, transfer to [place] for cardiology services and possible thoracentesis/chest tube in am.    August Gosser A 10/11/2014, 12:25 AM

## 2014-10-11 NOTE — Procedures (Signed)
Successful US guided right thoracentesis. Yielded 1.4 liters of amber colored fluid. Pt tolerated procedure well. No immediate complications.  Specimen was sent for labs. CXR ordered.  Tsosie Billing D PA-C 10/11/2014 9:46 AM

## 2014-10-11 NOTE — Consult Note (Signed)
Referring Physician:  Primary Physician: Primary Cardiologist: Dr. Domenic Polite Reason for Consultation: "pleural effusion"   HPI: [name] [name] is 70 y/o woman with pmh htn, hlp, DM2, CAD s/p CABG chronic diastolic CHF EF 01% , pulmonary hypertension. She follows up with Dr. Domenic Polite and was recently admitted for with acute on chronic respiratory failure complicated by diastolic heart failure and volume overload. Patient diuresed 5 pounds and also underwent ultrasound-guided thoracentesis. Cardiac markers argued against ACS, and follow-up echocardiogram showed LVEF 50-55% as outlined below. She went to ED again tonight with worsening SOB at [facility] and transferred her for further workup. CXR showed large pleural effusion on right side. Currently on Bipap was given lasix.  Denies cp. Trop negative   Review of Systems:     Cardiac Review of Systems: {Y] = yes _0  = no  Chest Pain [    ]  Resting SOB [  y ] Exertional SOB  [  y]  Orthopnea [  ]   Pedal Edema [   ]    Palpitations [  ] Syncope  [  ]   Presyncope [   ]  General Review of Systems: [Y] = yes [  ]=no Constitional: recent weight change [  ]; anorexia [  ]; fatigue [  ]; nausea [  ]; night sweats [  ]; fever [  ]; or chills [  ];                                                                     Eyes : blurred vision [  ]; diplopia [   ]; vision changes [  ];  Amaurosis fugax[  ]; Resp: cough [  ];  wheezing[  ];  hemoptysis[  ];  PND [  ];  GI:  gallstones[  ], vomiting[  ];  dysphagia[  ]; melena[  ];  hematochezia [  ]; heartburn[  ];   GU: kidney stones [  ]; hematuria[  ];   dysuria [  ];  nocturia[  ]; incontinence [  ];             Skin: rash, swelling[  ];, hair loss[  ];  peripheral edema[  ];  or itching[  ]; Musculosketetal: myalgias[  ];  joint swelling[  ];  joint erythema[  ];  joint pain[  ];  back pain[  ];  Heme/Lymph: bruising[  ];  bleeding[  ];  anemia[  ];  Neuro: TIA[  ];  headaches[  ];  stroke[  ];   vertigo[  ];  seizures[  ];   paresthesias[  ];  difficulty walking[  ];  Psych:depression[  ]; anxiety[  ];  Endocrine: diabetes[  ];  thyroid dysfunction[  ];  Other:  Past Medical History  Diagnosis Date  . Mixed hyperlipidemia   . Essential hypertension, benign   . Depression   . Cellulitis     a. Recurrent, bilateral legs  . Carotid artery disease (HCC)     a. Bilateral CEA; 50-60% bilateral ICA stenosis, 11/12  . PAD (peripheral artery disease) ([place])   . Suicide attempt ([place]) 08/2002  . TIA (transient ischemic attack)   . TMJ syndrome   . Herniated  disc   . Ischemic cardiomyopathy     a. 11/2010 TEE: EF 40-45%.  . Critical lower limb ischemia   . Venous insufficiency   . Coronary atherosclerosis of native coronary artery     a. 11/2010 NSTEMI/CABG x 4: L-LAD; S-PL; S-OM; S-DX, by PVT.  [name] NSTEMI (non-ST elevated myocardial infarction) ([place]) 11/2010  . Pneumonia 2000's X 2  . Type 2 diabetes mellitus ([place])   . History of blood transfusion   . Migraine   . Stroke ([place]) 08/2009  . Arthritis   . Chronic back pain   . On home oxygen therapy   . Chronic diastolic heart failure (HCC)     Medications Prior to Admission  Medication Sig Dispense Refill  . acetaminophen (TYLENOL) 500 MG tablet Take 1,000 mg by mouth every 6 (six) hours as needed.    [name] amLODipine (NORVASC) 10 MG tablet Take 1 tablet (10 mg total) by mouth daily. 90 tablet 3  . aspirin 81 MG EC tablet Take 81 mg by mouth daily.    [name] atorvastatin (LIPITOR) 40 MG tablet Take 40 mg by mouth daily at 6 PM.    . carvedilol (COREG) 25 MG tablet Take 1 tablet (25 mg total) by mouth 2 (two) times daily with a meal. 60 tablet 3  . citalopram (CELEXA) 20 MG tablet Take 20 mg by mouth daily.    . clopidogrel (PLAVIX) 75 MG tablet Take 1 tablet (75 mg total) by mouth daily with breakfast. 30 tablet 9  . Docusate Calcium (STOOL SOFTENER PO) Take 100 mg by mouth daily as needed (for constipation).     . gabapentin (NEURONTIN)  300 MG capsule Take 300 mg by mouth daily.    [name] glimepiride (AMARYL) 2 MG tablet Take 2 mg by mouth 2 (two) times daily.     . hydrALAZINE (APRESOLINE) 25 MG tablet Take 25 mg by mouth 3 (three) times daily.    [name] HYDROcodone-acetaminophen (NORCO/VICODIN) 5-325 MG per tablet Take 1 tablet by mouth every 6 (six) hours as needed for moderate pain. (Patient taking differently: Take 0.5-1 tablets by mouth every 6 (six) hours as needed for moderate pain. ) 30 tablet 0  . insulin aspart (NOVOLOG) 100 UNIT/ML injection Inject 0-8 Units into the skin 3 (three) times daily as needed for high blood sugar. >150 = 2 units, >200 = 4 units, >250 = 6 units, >300 = 8 units    . insulin detemir (LEVEMIR) 100 UNIT/ML injection Inject 22 Units into the skin at bedtime.     . isosorbide dinitrate (ISORDIL) 30 MG tablet Take 15-30 mg by mouth 2 (two) times daily. Takes 15 mg in the 30 in the evening.    . isosorbide dinitrate (ISORDIL) 30 MG tablet Take 15-30 mg by mouth 2 (two) times daily. Takes 15 mg in the morning and 30 mg in the evening.    . isosorbide mononitrate (IMDUR) 60 MG 24 hr tablet Take 1 tablet (60 mg total) by mouth daily. (Patient taking differently: Take by mouth daily. ) 90 tablet 3  . lisinopril (PRINIVIL,ZESTRIL) 20 MG tablet Take 1 tablet (20 mg total) by mouth daily. 90 tablet 3  . Multiple Vitamin (MULTIVITAMIN WITH MINERALS) TABS tablet Take 1 tablet by mouth daily.    . Multiple Vitamins-Minerals (HAIR/SKIN/NAILS) TABS Take 1 tablet by mouth daily.    . potassium chloride (K-DUR) 10 MEQ tablet Take 1 tablet (10 mEq total) by mouth daily. 90 tablet 3  . torsemide (DEMADEX) 20  MG tablet Take 2 tablets (40 mg total) by mouth every morning. & 20 mg in the evening. You may take an extra tablet in the evening if your weight increases 3 lbs in a day until your weight is back down to around 195 lbs, then resume previous dosage (Patient taking differently: Take 20-40 mg by mouth 2 (two) times daily. Every  day patient takes 2 tablets in the morning and 1 tablet in the evening. May take an extra tablet in the evening if your weight increases 3 lbs in a day until your weight is back down to around 195 lbs, then resume previous dosage) 100 tablet 3  . Umeclidinium-Vilanterol 62.5-25 MCG/INH AEPB Inhale 1 application into the lungs daily.     [name] albuterol (PROVENTIL HFA;VENTOLIN HFA) 108 (90 BASE) MCG/ACT inhaler Inhale 2 puffs into the lungs daily as needed for wheezing or shortness of breath.     . nitroGLYCERIN (NITROSTAT) 0.4 MG SL tablet Place 1 tablet (0.4 mg total) under the tongue every 5 (five) minutes as needed for chest pain. Up to 3 doses. If no relief after 3rd dose, proceed to the ED for an evaluation 25 tablet 3     . amLODipine  10 mg Oral Daily  . antiseptic oral rinse  7 mL Mouth Rinse q12n4p  . aspirin EC  81 mg Oral Daily  . atorvastatin  40 mg Oral q1800  . carvedilol  25 mg Oral BID WC  . chlorhexidine  15 mL Mouth Rinse BID  . citalopram  20 mg Oral Daily  . clopidogrel  75 mg Oral Daily  . enoxaparin (LOVENOX) injection  40 mg Subcutaneous Q24H  . furosemide  80 mg Intravenous Q12H  . gabapentin  300 mg Oral Daily  . hydrALAZINE  25 mg Oral TID  . insulin aspart  0-9 Units Subcutaneous 6 times per day  . insulin detemir  22 Units Subcutaneous QHS  . isosorbide dinitrate  15 mg Oral Daily  . isosorbide dinitrate  30 mg Oral QPM  . isosorbide mononitrate  45 mg Oral Daily  . lisinopril  20 mg Oral Daily  . multivitamin with minerals  1 tablet Oral Daily  . potassium chloride  10 mEq Oral Daily  . sodium chloride  3 mL Intravenous Q12H  . Umeclidinium-Vilanterol  1 application Inhalation Daily    Infusions:    Allergies  Allergen Reactions  . Other Shortness Of Breath, Rash and Other (See Comments)    All berries   . Strawberry Hives, Shortness Of Breath and Rash  . Sulfa Antibiotics Swelling    Bodily Swelling  . Tape Other (See Comments)    Tears skin.   Please use "paper" tape only.    Social History   Social History  . Marital Status: Widowed    Spouse Name: N/A  . Number of Children: N/A  . Years of Education: N/A   Occupational History  . Not on file.   Social History Main Topics  . Smoking status: Former Smoker -- 3.00 packs/day for 50 years    Types: Cigarettes    Start date: 01/24/1956    Quit date: 10/09/2009  . Smokeless tobacco: Never Used  . Alcohol Use: 0.0 oz/week    0 Standard drinks or equivalent per week     Comment: 10/06/2013 "might have a drink q 2-3 months"  . Drug Use: Yes    Special: Marijuana     Comment: "smoked pot in my teens"  .  Sexual Activity: Not Currently   Other Topics Concern  . Not on file   Social History Narrative   Lives in [place] by herself.  She does not work.    Family History  Problem Relation Age of Onset  . Coronary artery disease Father     Died with MI age 81  . Heart disease Father   . Heart attack Father   . Diabetes type II Mother   . Hypertension Mother   . Diabetes Mother   . Heart disease Mother   . Hyperlipidemia Mother   . Heart failure Sister   . Cancer Sister   . Cancer Brother     Lung cancer  . Diabetes Daughter   . Hyperlipidemia Daughter   . Diabetes Son   . Hyperlipidemia Son     PHYSICAL EXAM: Filed Vitals:   10/11/14 0424  BP: 155/46  Pulse: 65  Temp: 98.4 F (36.9 C)  Resp: 30     Intake/Output Summary (Last 24 hours) at 10/11/14 0643 Last data filed at 10/11/14 0237  Gross per 24 hour  Intake     50 ml  Output    955 ml  Net   -905 ml    General:  Well appearing. No respiratory difficulty HEENT: normal Neck: supple. no JVD. Carotids 2+ bilat; no bruits. N Cor: PMI nondisplaced. Regular rate & rhythm.  Lungs: poor air entry R side  Dull  Abdomen: soft, nontender, nondistended. No hepatosplenomegaly.  Extremities: no cyanosis, clubbing, rash,  2+ edema  Neuro: alert & oriented x 3, cranial nerves grossly intact. moves all 4  extremities w/o difficulty.  ECG:  Results for orders placed or performed during the hospital encounter of 10/10/14 (from the past 24 hour(s))  Basic metabolic panel     Status: Abnormal   Collection Time: 10/10/14  9:53 PM  Result Value Ref Range   Sodium 134 (L) 135 - 145 mmol/L   Potassium 4.2 3.5 - 5.1 mmol/L   Chloride 97 (L) 101 - 111 mmol/L   CO2 29 22 - 32 mmol/L   Glucose, Bld 256 (H) 65 - 99 mg/dL   BUN 35 (H) 6 - 20 mg/dL   Creatinine, Ser 1.34 (H) 0.44 - 1.00 mg/dL   Calcium 8.5 (L) 8.9 - 10.3 mg/dL   GFR calc non Af Amer 40 (L) >60 mL/min   GFR calc Af Amer 46 (L) >60 mL/min   Anion gap 8 5 - 15  CBC with Differential     Status: Abnormal   Collection Time: 10/10/14  9:53 PM  Result Value Ref Range   WBC 11.2 (H) 4.0 - 10.5 K/uL   RBC 3.25 (L) 3.87 - 5.11 MIL/uL   Hemoglobin 9.3 (L) 12.0 - 15.0 g/dL   HCT 29.2 (L) 36.0 - 46.0 %   MCV 89.8 78.0 - 100.0 fL   MCH 28.6 26.0 - 34.0 pg   MCHC 31.8 30.0 - 36.0 g/dL   RDW 15.4 11.5 - 15.5 %   Platelets 190 150 - 400 K/uL   Neutrophils Relative % 88 %   Neutro Abs 9.8 (H) 1.7 - 7.7 K/uL   Lymphocytes Relative 6 %   Lymphs Abs 0.7 0.7 - 4.0 K/uL   Monocytes Relative 6 %   Monocytes Absolute 0.7 0.1 - 1.0 K/uL   Eosinophils Relative 0 %   Eosinophils Absolute 0.0 0.0 - 0.7 K/uL   Basophils Relative 0 %   Basophils Absolute 0.0 0.0 - 0.1 K/uL  Blood culture (routine x 2)     Status: None (Preliminary result)   Collection Time: 10/10/14  9:53 PM  Result Value Ref Range   Specimen Description RIGHT ANTECUBITAL    Special Requests BOTTLES DRAWN AEROBIC AND ANAEROBIC 6CC    Culture NO GROWTH < 12 HOURS    Report Status PENDING   Troponin I     Status: Abnormal   Collection Time: 10/10/14  9:53 PM  Result Value Ref Range   Troponin I 0.05 (H) <0.031 ng/mL  Brain natriuretic peptide     Status: Abnormal   Collection Time: 10/10/14  9:53 PM  Result Value Ref Range   B Natriuretic Peptide 914.0 (H) 0.0 - 100.0 pg/mL    Blood culture (routine x 2)     Status: None (Preliminary result)   Collection Time: 10/10/14 10:00 PM  Result Value Ref Range   Specimen Description BLOOD RIGHT ARM    Special Requests BOTTLES DRAWN AEROBIC ONLY 6CC    Culture NO GROWTH < 12 HOURS    Report Status PENDING   Lactic acid, plasma     Status: None   Collection Time: 10/10/14 10:29 PM  Result Value Ref Range   Lactic Acid, Venous 1.1 0.5 - 2.0 mmol/L  Blood gas, arterial ([facility] & [facility] ONLY)     Status: Abnormal   Collection Time: 10/10/14 10:56 PM  Result Value Ref Range   FIO2 100.00    Delivery systems NON-REBREATHER OXYGEN MASK    pH, Arterial 7.440 7.350 - 7.450   pCO2 arterial 43.0 35.0 - 45.0 mmHg   pO2, Arterial 68.1 (L) 80.0 - 100.0 mmHg   Bicarbonate 28.5 (H) 20.0 - 24.0 mEq/L   Acid-Base Excess 4.7 (H) 0.0 - 2.0 mmol/L   O2 Saturation 93.3 %   Patient temperature 98.6    Collection site RADIAL    Drawn by [id number]    Sample type ARTERIAL    Allens test (pass/fail) PASS PASS  Urinalysis, Routine w reflex microscopic (not at [facility])     Status: Abnormal   Collection Time: 10/10/14 11:45 PM  Result Value Ref Range   Color, Urine YELLOW YELLOW   APPearance CLEAR CLEAR   Specific Gravity, Urine 1.010 1.005 - 1.030   pH 6.0 5.0 - 8.0   Glucose, UA NEGATIVE NEGATIVE mg/dL   Hgb urine dipstick TRACE (A) NEGATIVE   Bilirubin Urine NEGATIVE NEGATIVE   Ketones, ur NEGATIVE NEGATIVE mg/dL   Protein, ur 30 (A) NEGATIVE mg/dL   Urobilinogen, UA 0.2 0.0 - 1.0 mg/dL   Nitrite NEGATIVE NEGATIVE   Leukocytes, UA NEGATIVE NEGATIVE  Urine microscopic-add on     Status: Abnormal   Collection Time: 10/10/14 11:45 PM  Result Value Ref Range   Squamous Epithelial / LPF RARE RARE   WBC, UA 0-2 <3 WBC/hpf   RBC / HPF 0-2 <3 RBC/hpf   Bacteria, UA FEW (A) RARE  Lactic acid, plasma     Status: None   Collection Time: 10/11/14  2:11 AM  Result Value Ref Range   Lactic Acid, Venous 0.7 0.5 - 2.0 mmol/L  Blood gas, arterial ([facility]  & [facility] ONLY)     Status: Abnormal   Collection Time: 10/11/14  2:35 AM  Result Value Ref Range   FIO2 0.70    Delivery systems BILEVEL POSITIVE AIRWAY PRESSURE    Mode OXYGEN MASK    LHR 8 resp/min   Inspiratory PAP 12    Expiratory PAP 6.0  pH, Arterial 7.436 7.350 - 7.450   pCO2 arterial 44.4 35.0 - 45.0 mmHg   pO2, Arterial 95.8 80.0 - 100.0 mmHg   Bicarbonate 29.0 (H) 20.0 - 24.0 mEq/L   Acid-Base Excess 5.2 (H) 0.0 - 2.0 mmol/L   O2 Saturation 95.8 %   Patient temperature 98.6    Collection site RADIAL    Drawn by [id number]    Sample type ARTERIAL    Allens test (pass/fail) PASS PASS  MRSA PCR Screening     Status: None   Collection Time: 10/11/14  4:16 AM  Result Value Ref Range   MRSA by PCR NEGATIVE NEGATIVE  Glucose, capillary     Status: Abnormal   Collection Time: 10/11/14  4:42 AM  Result Value Ref Range   Glucose-Capillary 165 (H) 65 - 99 mg/dL   Dg Chest 2 View  10/10/2014   CLINICAL DATA:  Shortness of breath since yesterday  EXAM: CHEST  2 VIEW  COMPARISON:  09/08/2014  FINDINGS: Large right pleural effusion obscuring the majority of the right lung.  Cardiomegaly and mild pulmonary edema.  Status post CABG.  IMPRESSION: 1. Large right pleural effusion obscuring most of the right lung. 2. Cardiomegaly and mild pulmonary edema.   Electronically Signed   By: Monte Fantasia M.D.   On: 10/10/2014 21:58    EKG, NSR, PRWP, non specific ST changes    ASSESSMENT: Large right pleural effusion Acute on chronic diastolic CHF HTn HLP    PLAN/DISCUSSION: 1. Large recurrent pleural effusion may have a loculated component after reviewing CXR. May need a CT chest without contrast. Agree with thoracentesis with fluid analysis r/o parapneumonic effusion  2. Continue diuresis 3. CXR shows enlarged RV , review echo for pulmonary hypertension. May need RHC after thoracentesis and diuresis for pulmonary hypertension evaluation 4. Thankyou for the consult, will follow along with  you   [place]

## 2014-10-11 NOTE — Progress Notes (Addendum)
Patient seen and examined this morning, admitted overnight. H&P reviewed.  In brief this is a 69 yo F admitted with acute hypoxic respiratory failure due to acute on chronic diastolic CHF and recurrent pleural effusion, also she has noted ~6 lb weight gain in the past few days. Reports compliance with her home medication regimen.  - s/p thoracentesis this morning, labs pending, 1.4 L removed - continue IV diuresis - cardiology consulted - wean off oxygen as tolerated  Costin M. Cruzita Lederer, MD [facility] [phone]

## 2014-10-11 NOTE — Progress Notes (Signed)
Utilization Review Completed.Donne Anon T10/02/2014

## 2014-10-11 NOTE — Progress Notes (Signed)
Admitting MD paged about patients arrival to floor

## 2014-10-12 ENCOUNTER — Inpatient Hospital Stay (HOSPITAL_COMMUNITY): Payer: Medicare Other

## 2014-10-12 DIAGNOSIS — R4189 Other symptoms and signs involving cognitive functions and awareness: Secondary | ICD-10-CM | POA: Diagnosis not present

## 2014-10-12 DIAGNOSIS — I5020 Unspecified systolic (congestive) heart failure: Secondary | ICD-10-CM

## 2014-10-12 DIAGNOSIS — J9601 Acute respiratory failure with hypoxia: Secondary | ICD-10-CM | POA: Diagnosis present

## 2014-10-12 DIAGNOSIS — D649 Anemia, unspecified: Secondary | ICD-10-CM

## 2014-10-12 DIAGNOSIS — E162 Hypoglycemia, unspecified: Secondary | ICD-10-CM

## 2014-10-12 DIAGNOSIS — L97509 Non-pressure chronic ulcer of other part of unspecified foot with unspecified severity: Secondary | ICD-10-CM | POA: Diagnosis present

## 2014-10-12 LAB — COMPREHENSIVE METABOLIC PANEL
ALBUMIN: 2.4 g/dL — AB (ref 3.5–5.0)
ALK PHOS: 69 U/L (ref 38–126)
ALT: 23 U/L (ref 14–54)
ANION GAP: 8 (ref 5–15)
AST: 24 U/L (ref 15–41)
BILIRUBIN TOTAL: 0.7 mg/dL (ref 0.3–1.2)
BUN: 30 mg/dL — AB (ref 6–20)
CALCIUM: 8.7 mg/dL — AB (ref 8.9–10.3)
CO2: 34 mmol/L — ABNORMAL HIGH (ref 22–32)
CREATININE: 1.13 mg/dL — AB (ref 0.44–1.00)
Chloride: 97 mmol/L — ABNORMAL LOW (ref 101–111)
GFR calc Af Amer: 57 mL/min — ABNORMAL LOW (ref >60)
GFR calc non Af Amer: 49 mL/min — ABNORMAL LOW (ref >60)
GLUCOSE: 84 mg/dL (ref 65–99)
Potassium: 3.7 mmol/L (ref 3.5–5.1)
Sodium: 139 mmol/L (ref 135–145)
TOTAL PROTEIN: 6.4 g/dL — AB (ref 6.5–8.1)

## 2014-10-12 LAB — BLOOD GAS, ARTERIAL
ACID-BASE EXCESS: 7.1 mmol/L — AB (ref 0.0–2.0)
Bicarbonate: 32 mEq/L — ABNORMAL HIGH (ref 20.0–24.0)
DRAWN BY: 22766
FIO2: 1
O2 SAT: 96.9 %
PATIENT TEMPERATURE: 98.6
PCO2 ART: 54.4 mmHg — AB (ref 35.0–45.0)
TCO2: 33.7 mmol/L (ref 0–100)
pH, Arterial: 7.387 (ref 7.350–7.450)
pO2, Arterial: 92.4 mmHg (ref 80.0–100.0)

## 2014-10-12 LAB — GLUCOSE, CAPILLARY
GLUCOSE-CAPILLARY: 130 mg/dL — AB (ref 65–99)
GLUCOSE-CAPILLARY: 136 mg/dL — AB (ref 65–99)
GLUCOSE-CAPILLARY: 69 mg/dL (ref 65–99)
GLUCOSE-CAPILLARY: 98 mg/dL (ref 65–99)
Glucose-Capillary: 93 mg/dL (ref 65–99)
Glucose-Capillary: 66 mg/dL (ref 65–99)
Glucose-Capillary: 128 mg/dL — ABNORMAL HIGH (ref 65–99)
Glucose-Capillary: 143 mg/dL — ABNORMAL HIGH (ref 65–99)

## 2014-10-12 LAB — IRON AND TIBC
Iron: 11 ug/dL — ABNORMAL LOW (ref 28–170)
SATURATION RATIOS: 5 % — AB (ref 10.4–31.8)
TIBC: 241 ug/dL — AB (ref 250–450)
UIBC: 230 ug/dL

## 2014-10-12 LAB — TYPE AND SCREEN
ABO/RH(D): O POS
Antibody Screen: NEGATIVE

## 2014-10-12 LAB — FERRITIN: Ferritin: 99 ng/mL (ref 11–307)

## 2014-10-12 LAB — CBC
HCT: 28.2 % — ABNORMAL LOW (ref 36.0–46.0)
Hemoglobin: 8.6 g/dL — ABNORMAL LOW (ref 12.0–15.0)
MCH: 27.3 pg (ref 26.0–34.0)
MCHC: 30.5 g/dL (ref 30.0–36.0)
MCV: 89.5 fL (ref 78.0–100.0)
PLATELETS: 190 10*3/uL (ref 150–400)
RBC: 3.15 MIL/uL — ABNORMAL LOW (ref 3.87–5.11)
RDW: 15.3 % (ref 11.5–15.5)
WBC: 9 10*3/uL (ref 4.0–10.5)

## 2014-10-12 LAB — HEMOGLOBIN AND HEMATOCRIT, BLOOD
HCT: 27.4 % — ABNORMAL LOW (ref 36.0–46.0)
Hemoglobin: 8.3 g/dL — ABNORMAL LOW (ref 12.0–15.0)

## 2014-10-12 LAB — MAGNESIUM: Magnesium: 2.1 mg/dL (ref 1.7–2.4)

## 2014-10-12 LAB — TROPONIN I
TROPONIN I: 0.04 ng/mL — AB (ref <0.031)
TROPONIN I: 0.08 ng/mL — AB (ref <0.031)

## 2014-10-12 LAB — VITAMIN B12: VITAMIN B 12: 660 pg/mL (ref 180–914)

## 2014-10-12 LAB — TSH: TSH: 2.21 u[IU]/mL (ref 0.350–4.500)

## 2014-10-12 LAB — FOLATE: FOLATE: 46.2 ng/mL (ref >5.9)

## 2014-10-12 LAB — PATHOLOGIST SMEAR REVIEW

## 2014-10-12 MED ORDER — DEXTROSE 5 % IV SOLN
120.0000 mg | Freq: Two times a day (BID) | INTRAVENOUS | Status: DC
  Administered 2014-10-12 – 2014-10-17 (×10): 120 mg via INTRAVENOUS
  Filled 2014-10-12 (×11): qty 12

## 2014-10-12 MED ORDER — CYCLOBENZAPRINE HCL 10 MG PO TABS: 5.0000 mg | ORAL_TABLET | Freq: Three times a day (TID) | ORAL | Status: DC | PRN

## 2014-10-12 MED ORDER — OXYCODONE HCL 5 MG PO TABS: 5.0000 mg | ORAL_TABLET | ORAL | Status: DC | PRN

## 2014-10-12 MED ORDER — INSULIN DETEMIR 100 UNIT/ML ~~LOC~~ SOLN
15.0000 [IU] | Freq: Every day | SUBCUTANEOUS | Status: DC
  Administered 2014-10-12: 15 [IU] via SUBCUTANEOUS
  Filled 2014-10-12 (×2): qty 0.15

## 2014-10-12 MED ORDER — INSULIN ASPART 100 UNIT/ML ~~LOC~~ SOLN
0.0000 [IU] | Freq: Three times a day (TID) | SUBCUTANEOUS | Status: DC
  Administered 2014-10-14 – 2014-10-15 (×2): 2 [IU] via SUBCUTANEOUS
  Administered 2014-10-16: 5 [IU] via SUBCUTANEOUS
  Administered 2014-10-16: 2 [IU] via SUBCUTANEOUS
  Administered 2014-10-17: 3 [IU] via SUBCUTANEOUS
  Administered 2014-10-17: 5 [IU] via SUBCUTANEOUS
  Administered 2014-10-18: 1 [IU] via SUBCUTANEOUS
  Administered 2014-10-18: 5 [IU] via SUBCUTANEOUS
  Administered 2014-10-18: 7 [IU] via SUBCUTANEOUS
  Administered 2014-10-19: 3 [IU] via SUBCUTANEOUS
  Administered 2014-10-19: 2 [IU] via SUBCUTANEOUS

## 2014-10-12 MED ORDER — POTASSIUM CHLORIDE CRYS ER 10 MEQ PO TBCR
10.0000 meq | EXTENDED_RELEASE_TABLET | Freq: Two times a day (BID) | ORAL | Status: DC
  Administered 2014-10-12 – 2014-10-19 (×14): 10 meq via ORAL
  Filled 2014-10-12 (×19): qty 1

## 2014-10-12 MED ORDER — OXYCODONE HCL 5 MG PO TABS
5.0000 mg | ORAL_TABLET | Freq: Once | ORAL | Status: AC
  Administered 2014-10-12: 5 mg via ORAL
  Filled 2014-10-12: qty 1

## 2014-10-12 MED ORDER — GABAPENTIN 300 MG PO CAPS: 300.0000 mg | ORAL_CAPSULE | Freq: Two times a day (BID) | ORAL | Status: DC

## 2014-10-12 NOTE — Progress Notes (Signed)
[facility] PROGRESS NOTE  [name] [name] OXB:[id number] DOB: 06/11/1945 DOA: 10/10/2014 PCP: Glenda Chroman., MD  Assessment/Plan:  Principal Problem:   Acute on chronic diastolic congestive heart failure ([place]): continue IV diuresis  Active Problems:   Hypertension, controlled    PAD (peripheral artery disease) ([place]): patient reports Dr. Gwenlyn Found to do a procedure on both legs at some point in the future.     Has necrotic eschar on right second toe.  Will consult WOC for recs  Patient complaining of chronic leg pain: PAD? Neuropathy? On gabapentin qhs. Will change to bid. Reports "cramping sensation". Will order flexeril prn  Diabetes mellitus type II, with hypoglycemia. Will decrease levemir to 15 units qhs and change ssi to qac (currently q4h)    Pleural effusion, right, recurrent: s/p thoracentesis. Transudative. If recurs, consider pleurx    Acute on chronic respiratory failure with hypoxia ([place]): currenly on ventimask. Wean as able    Anemia: no reported bleeding. Acute on chronic. Check anemia panel and hemoccults.  PT eval   Code Status:  full Family Communication:  Pt is lucid Disposition Plan:  ?  Consultants:  cardiology  Procedures:     Antibiotics:    HPI/Subjective: C/o bilateral leg pain. Breathing improved  Objective: Filed Vitals:   10/12/14 0726  BP: 127/46  Pulse: 51  Temp: 98.2 F (36.8 C)  Resp: 20    Intake/Output Summary (Last 24 hours) at 10/12/14 1113 Last data filed at 10/12/14 0849  Gross per 24 hour  Intake   1089 ml  Output   3550 ml  Net  -2461 ml   Filed Weights   10/10/14 2105 10/11/14 0424 10/12/14 0425  Weight: 90.719 kg (200 lb) 92.216 kg (203 lb 4.8 oz) 86.32 kg (190 lb 4.8 oz)    Exam:   General:  Weak appearing. No respiratory distress.  Cardiovascular: RRR without MGR  Respiratory: diminished throughout without WRR  Abdomen: s, nd, nd  Ext: necrotic eschar right second toe. Unable to palpate  pedal pulses. Chronic venous stasis changes.  Basic Metabolic Panel:  Recent Labs Lab 10/10/14 2153 10/11/14 0527 10/12/14 0444  NA 134*  --  139  K 4.2  --  3.7  CL 97*  --  97*  CO2 29  --  34*  GLUCOSE 256*  --  84  BUN 35*  --  30*  CREATININE 1.34* 1.28* 1.13*  CALCIUM 8.5*  --  8.7*  MG  --   --  2.1   Liver Function Tests:  Recent Labs Lab 10/12/14 0444  AST 24  ALT 23  ALKPHOS 69  BILITOT 0.7  PROT 6.4*  ALBUMIN 2.4*   No results for input(s): LIPASE, AMYLASE in the last 168 hours. No results for input(s): AMMONIA in the last 168 hours. CBC:  Recent Labs Lab 10/10/14 2153 10/11/14 0527 10/12/14 0444  WBC 11.2* 9.7 9.0  NEUTROABS 9.8*  --   --   HGB 9.3* 8.9* 8.6*  HCT 29.2* 28.3* 28.2*  MCV 89.8 89.3 89.5  PLT 190 175 190   Cardiac Enzymes:  Recent Labs Lab 10/10/14 2153 10/11/14 0527 10/11/14 1021 10/11/14 1555  TROPONINI 0.05* 0.13* 0.06* 0.10*   BNP (last 3 results)  Recent Labs  09/09/14 0245 10/10/14 2153  BNP 436.8* 914.0*    ProBNP (last 3 results) No results for input(s): PROBNP in the last 8760 hours.  CBG:  Recent Labs Lab 10/12/14 0013 10/12/14 0422 10/12/14 6834 10/12/14 1962 10/12/14 0845  GLUCAP 136* 69 93 66 128*    Recent Results (from the past 240 hour(s))  Blood culture (routine x 2)     Status: None (Preliminary result)   Collection Time: 10/10/14  9:53 PM  Result Value Ref Range Status   Specimen Description RIGHT ANTECUBITAL  Final   Special Requests BOTTLES DRAWN AEROBIC AND ANAEROBIC 6CC  Final   Culture NO GROWTH 2 DAYS  Final   Report Status PENDING  Incomplete  Blood culture (routine x 2)     Status: None (Preliminary result)   Collection Time: 10/10/14 10:00 PM  Result Value Ref Range Status   Specimen Description BLOOD RIGHT ARM  Final   Special Requests BOTTLES DRAWN AEROBIC ONLY 6CC  Final   Culture NO GROWTH 2 DAYS  Final   Report Status PENDING  Incomplete  MRSA PCR Screening      Status: None   Collection Time: 10/11/14  4:16 AM  Result Value Ref Range Status   MRSA by PCR NEGATIVE NEGATIVE Final    Comment:        The GeneXpert MRSA Assay (FDA approved for NASAL specimens only), is one component of a comprehensive MRSA colonization surveillance program. It is not intended to diagnose MRSA infection nor to guide or monitor treatment for MRSA infections.   Culture, body fluid-bottle     Status: None (Preliminary result)   Collection Time: 10/11/14  9:57 AM  Result Value Ref Range Status   Specimen Description FLUID RIGHT PLEURAL  Final   Special Requests BAA 10CCS  Final   Culture PENDING  Incomplete   Report Status PENDING  Incomplete  Gram stain     Status: None   Collection Time: 10/11/14  9:57 AM  Result Value Ref Range Status   Specimen Description FLUID RIGHT PLEURAL  Final   Special Requests NONE  Final   Gram Stain   Final    MODERATE WBC PRESENT, PREDOMINANTLY PMN NO ORGANISMS SEEN    Report Status 10/11/2014 FINAL  Final     Studies: Dg Chest 1 View  10/11/2014   CLINICAL DATA:  Post right thoracentesis.  Short of breath  EXAM: CHEST 1 VIEW  COMPARISON:  10/10/2014  FINDINGS: Significant decrease in right pleural effusion. Small right effusion and right lower lobe atelectasis remains. No pneumothorax  Cardiac enlargement.  Congestive heart failure with mild edema.  IMPRESSION: Improvement in right pleural effusion. Small residual right effusion and right lower lobe atelectasis. No pneumothorax  Congestive heart failure with mild edema.   Electronically Signed   By: Franchot Gallo M.D.   On: 10/11/2014 10:06   Dg Chest 2 View  10/10/2014   CLINICAL DATA:  Shortness of breath since yesterday  EXAM: CHEST  2 VIEW  COMPARISON:  09/08/2014  FINDINGS: Large right pleural effusion obscuring the majority of the right lung.  Cardiomegaly and mild pulmonary edema.  Status post CABG.  IMPRESSION: 1. Large right pleural effusion obscuring most of the right  lung. 2. Cardiomegaly and mild pulmonary edema.   Electronically Signed   By: Monte Fantasia M.D.   On: 10/10/2014 21:58   US Thoracentesis Asp Pleural Space W/img Guide  10/11/2014   INDICATION: Symptomatic right sided pleural effusion  EXAM: US THORACENTESIS ASP PLEURAL SPACE W/IMG GUIDE  COMPARISON:  CXR 10/11/14.  MEDICATIONS: None  COMPLICATIONS: None immediate  TECHNIQUE: Informed written consent was obtained from the patient after a discussion of the risks, benefits and alternatives to treatment. A timeout was  performed prior to the initiation of the procedure.  Initial ultrasound scanning demonstrates a right pleural effusion. The lower chest was prepped and draped in the usual sterile fashion. 1% lidocaine was used for local anesthesia.  Under direct ultrasound guidance, a 19 gauge, 7-cm, Yueh catheter was introduced. An ultrasound image was saved for documentation purposes. The thoracentesis was performed. The catheter was removed and a dressing was applied. The patient tolerated the procedure well without immediate post procedural complication. The patient was escorted to have an upright chest radiograph.  FINDINGS: A total of approximately 1.4 liters of amber colored fluid was removed. Requested samples were sent to the laboratory.  IMPRESSION: Successful ultrasound-guided right sided thoracentesis yielding 1.4 liters of pleural fluid.  Read By:  Tsosie Billing PA-C   Electronically Signed   By: Lucrezia Europe M.D.   On: 10/11/2014 10:30    Scheduled Meds: . amLODipine  10 mg Oral Daily  . antiseptic oral rinse  7 mL Mouth Rinse q12n4p  . aspirin EC  81 mg Oral Daily  . atorvastatin  40 mg Oral q1800  . carvedilol  25 mg Oral BID WC  . chlorhexidine  15 mL Mouth Rinse BID  . citalopram  20 mg Oral Daily  . clopidogrel  75 mg Oral Daily  . enoxaparin (LOVENOX) injection  40 mg Subcutaneous Q24H  . furosemide  80 mg Intravenous Q12H  . gabapentin  300 mg Oral BID  . hydrALAZINE  25 mg Oral TID   . insulin aspart  0-9 Units Subcutaneous TID WC  . insulin detemir  15 Units Subcutaneous QHS  . isosorbide mononitrate  45 mg Oral Daily  . lisinopril  20 mg Oral Daily  . multivitamin with minerals  1 tablet Oral Daily  . potassium chloride  10 mEq Oral BID  . sodium chloride  3 mL Intravenous Q12H  . Umeclidinium-Vilanterol  1 application Inhalation Daily   Continuous Infusions:   Time spent: 35 minutes  [facility] [email or web address], password [facility] 10/12/2014, 11:13 AM  LOS: 2 days

## 2014-10-12 NOTE — Progress Notes (Signed)
   10/12/14 1452  Clinical Encounter Type  Visited With Patient not available;Health care provider  Visit Type Initial  Referral From Nurse  Spiritual Encounters  Spiritual Needs Literature    Chaplain attempted to complete an advanced directive with the patient, and the patient was resting. Chaplain will try to follow-up, and support is available as needed.   Jeri Lager, Chaplain 10/12/2014 2:54 PM

## 2014-10-12 NOTE — Progress Notes (Signed)
Notified by nursing staff that pt having c/o severe leg pain, and erythema. I spoke with patient at bedside and examined. She does have some red patches on legs bilaterally which appear to be possibly related to venous stasis. No increase warmth and nontender. Patient states that the erythema is normal for her and the pain was more muscular. Concern was for cellulitis given low grade temps but currently legs do not appear infected. Will treat pain and check K in morning as hypokalemia from lasix could be causing her worse-than-baseline cramping.

## 2014-10-12 NOTE — Progress Notes (Signed)
10/12/14 1639  Clinical Encounter Type  Visited With Patient;Health care provider  Visit Type Follow-up  Referral From [place] followed up with patient and gave Advanced Directive information. Patient had a hard time understanding what was going on, and also expressed wanting to "give up permanently." Chaplain left AD information, offered prayer, and is available to complete ADs and support as needed.   Jeri Lager, Chaplain 10/12/2014 4:41 PM

## 2014-10-12 NOTE — Progress Notes (Addendum)
Inpatient Diabetes Program Recommendations  AACE/ADA: New Consensus Statement on Inpatient Glycemic Control (2015)  Target Ranges:  Prepandial:   less than 140 mg/dL      Peak postprandial:   less than 180 mg/dL (1-2 hours)      Critically ill patients:  140 - 180 mg/dL    Results for [name] [name], [name] [name] (MRN [id number]) as of 10/12/2014 10:04  Ref. Range 10/12/2014 00:13 10/12/2014 04:22 10/12/2014 04:58 10/12/2014 07:52 10/12/2014 08:45  Glucose-Capillary Latest Ref Range: 65-99 mg/dL 136 (H) 69 93 66 128 (H)    Admit with: CHF/ Recurrent Pleural effusion  History: DM, CHF  Home DM Meds: Levemir 22 units QHS        Novolog 0-8 units tid per SSI       Amaryl 2 mg bid  Current Insulin Orders: Levemir 22 units QHS      Novolog Sensitive SSI (0-9 units) Q4 hours     MD- Patient with Hypoglycemia this AM (CBG 69 mg/dl at 4am and then CBG 66 mg/dl at 8am).    Patient would only agree to take 20 units Levemir last PM and still had Hypoglycemia this AM.  Please reduce Levemir to 15 units QHS     ----Will follow patient during hospitalization----  Wyn Quaker RN, MSN, CDE Diabetes Coordinator Inpatient Glycemic Control Team Team Pager: [phone] (8a-5p)

## 2014-10-12 NOTE — Progress Notes (Addendum)
Called to bedside about unresponsive episode, bradycardic into the 30s, hypoxic into the 60s, RR 5. Blood glucose 130. Now coming around. Groggy.  Pale. Diaphoretic. Answers questions now. Lungs diminished bilaterally cardiovascular slow, regular. Heart rate 50 currently. Satting in the 90s on nonrebreather. Respiratory rate currently about 12. Blood pressure okay. Will check stat chest x-ray ABG. EKG shows sinus bradycardia. Cycle enzymes again. Hold any sedating medications. She received gabapentin at 8:30 this morning. One hydrocodone at about 4 AM. Oxycodone at about midnight. Overall, appears improved from earlier and now responsive. Continue step down monitoring. Continue status.  Critical care time 40 minutes.  Doree Barthel, MD [facility]

## 2014-10-12 NOTE — Progress Notes (Signed)
Called by CCMD that patients HR sustaining in the 30's. Arrived to patients room, patient unresponsive to name and sternal rubs. O2 sats in 60's on Ventimask, HR 35, RR 5-6, extremely diaphoretic. Student Nurse assisted in pulling patient up in bed to reposition. Charge RN to bedside, at this time patient slowly starting to Tadeo Besecker [facility] when spoke to. CBG: 130, BP initially 170's. 12 lead EKG obtained; Respiratory called to assist in setting up BiPAP machine however patient began dry heaving, Non-Rebreather placed on patient instead by Respiratory. Dr. Conley Canal to bedside to assess patient. Pt currently lethargic, but awake and able to answer all questions appropriately. BP 130's, HR 40's, O2 sats upper 90s. Pt unable to recall event, stated she had some mild chest discomfort after "coming too." CXR, ABG, Troponin pending. Will continue to monitor patient closely.

## 2014-10-12 NOTE — Consult Note (Signed)
WOC wound consult note Reason for Consult: right toe ulcer.  Pt with known PAD.  Last ABI 5/16 R:0.46 Wound type: arterial  Measurement: 0.5cm x 0.5cm x 0 Wound bed: dry eschar, 100% Drainage (amount, consistency, odor)none  Periwound: intact  Dressing procedure/placement/frequency: Paint toe ulcer with betadine daily to keep dry and stable.  Ok to leave open to air.  Patient covers at home due to the presence of dogs in the home.  Discussed POC with patient and bedside nurse.  Re consult if needed, will not follow at this time. Thanks  Maranatha Grossi [place], [place] [phone])

## 2014-10-12 NOTE — Progress Notes (Signed)
Nutrition Brief Note  Patient identified on the Malnutrition Screening Tool (MST) Report. Patient reports that weight fluctuates with fluid status. Her "dry weight" has been stable.  Wt Readings from Last 15 Encounters:  10/12/14 190 lb 4.8 oz (86.32 kg)  10/02/14 198 lb (89.812 kg)  09/12/14 189 lb 8 oz (85.957 kg)  05/13/14 198 lb (89.812 kg)  02/25/14 197 lb (89.359 kg)  01/23/14 214 lb (97.07 kg)  11/12/13 215 lb 9.6 oz (97.796 kg)  11/10/13 213 lb (96.616 kg)  11/05/13 214 lb 8 oz (97.297 kg)  10/31/13 213 lb 14.4 oz (97.024 kg)  10/07/13 205 lb 4 oz (93.1 kg)  10/02/13 207 lb (93.895 kg)  09/10/13 204 lb 9.6 oz (92.806 kg)  09/08/13 193 lb (87.544 kg)  08/18/13 211 lb 12.8 oz (96.072 kg)    Body mass index is 30.73 kg/(m^2). Patient meets criteria for class one obestiy based on current BMI.   Current diet order is heart healthy. Labs and medications reviewed.   No nutrition interventions warranted at this time. If nutrition issues arise, please consult RD.   Molli Barrows, RD, LDN, [place] Pager [phone] After Hours Pager [phone]

## 2014-10-12 NOTE — Progress Notes (Signed)
Pt not using BIPAP at this time

## 2014-10-12 NOTE — Progress Notes (Signed)
Patient ID: [name] [name], female   DOB: 14-Apr-1945, 69 y.o.   MRN: [id number]    Subjective:  Denies SSCP, palpitations or Dyspnea   Objective:  Filed Vitals:   10/11/14 2010 10/12/14 0009 10/12/14 0425 10/12/14 0726  BP: 145/44 135/43 145/46 127/46  Pulse: 58 51 50 51  Temp: 100.1 F (37.8 C) 99.2 F (37.3 C) 98.8 F (37.1 C) 98.2 F (36.8 C)  TempSrc: Axillary Axillary  Oral  Resp: _0 Height:      Weight:   86.32 kg (190 lb 4.8 oz)   SpO2: 91% 93% 91% 95%    Intake/Output from previous day:  Intake/Output Summary (Last 24 hours) at 10/12/14 0919 Last data filed at 10/12/14 0849  Gross per 24 hour  Intake   1092 ml  Output   3550 ml  Net  -2458 ml    Physical Exam: Flat affect Chronically ill white female  HEENT: normal Neck supple with no adenopathy JVP normal no bruits no thyromegaly Lungs improved aeration at right base  no wheezing and good diaphragmatic motion Heart:  S1/S2 no murmur, no rub, gallop or click PMI normal Abdomen: benighn, BS positve, no tenderness, no AAA no bruit.  No HSM or HJR Distal pulses intact with no bruits No edema Neuro non-focal Skin warm and dry No muscular weakness   Lab Results: Basic Metabolic Panel:  Recent Labs  10/10/14 2153 10/11/14 0527 10/12/14 0444  NA 134*  --  139  K 4.2  --  3.7  CL 97*  --  97*  CO2 29  --  34*  GLUCOSE 256*  --  84  BUN 35*  --  30*  CREATININE 1.34* 1.28* 1.13*  CALCIUM 8.5*  --  8.7*  MG  --   --  2.1   Liver Function Tests:  Recent Labs  10/12/14 0444  AST 24  ALT 23  ALKPHOS 69  BILITOT 0.7  PROT 6.4*  ALBUMIN 2.4*   CBC:  Recent Labs  10/10/14 2153 10/11/14 0527 10/12/14 0444  WBC 11.2* 9.7 9.0  NEUTROABS 9.8*  --   --   HGB 9.3* 8.9* 8.6*  HCT 29.2* 28.3* 28.2*  MCV 89.8 89.3 89.5  PLT 190 175 190   Cardiac Enzymes:  Recent Labs  10/11/14 0527 10/11/14 1021 10/11/14 1555  TROPONINI 0.13* 0.06* 0.10*    Imaging: Dg Chest 1  View  10/11/2014   CLINICAL DATA:  Post right thoracentesis.  Short of breath  EXAM: CHEST 1 VIEW  COMPARISON:  10/10/2014  FINDINGS: Significant decrease in right pleural effusion. Small right effusion and right lower lobe atelectasis remains. No pneumothorax  Cardiac enlargement.  Congestive heart failure with mild edema.  IMPRESSION: Improvement in right pleural effusion. Small residual right effusion and right lower lobe atelectasis. No pneumothorax  Congestive heart failure with mild edema.   Electronically Signed   By: Franchot Gallo M.D.   On: 10/11/2014 10:06   Dg Chest 2 View  10/10/2014   CLINICAL DATA:  Shortness of breath since yesterday  EXAM: CHEST  2 VIEW  COMPARISON:  09/08/2014  FINDINGS: Large right pleural effusion obscuring the majority of the right lung.  Cardiomegaly and mild pulmonary edema.  Status post CABG.  IMPRESSION: 1. Large right pleural effusion obscuring most of the right lung. 2. Cardiomegaly and mild pulmonary edema.   Electronically Signed   By: Monte Fantasia M.D.   On: 10/10/2014 21:58   US Thoracentesis  Asp Pleural Space W/img Guide  10/11/2014   INDICATION: Symptomatic right sided pleural effusion  EXAM: US THORACENTESIS ASP PLEURAL SPACE W/IMG GUIDE  COMPARISON:  CXR 10/11/14.  MEDICATIONS: None  COMPLICATIONS: None immediate  TECHNIQUE: Informed written consent was obtained from the patient after a discussion of the risks, benefits and alternatives to treatment. A timeout was performed prior to the initiation of the procedure.  Initial ultrasound scanning demonstrates a right pleural effusion. The lower chest was prepped and draped in the usual sterile fashion. 1% lidocaine was used for local anesthesia.  Under direct ultrasound guidance, a 19 gauge, 7-cm, Yueh catheter was introduced. An ultrasound image was saved for documentation purposes. The thoracentesis was performed. The catheter was removed and a dressing was applied. The patient tolerated the procedure well  without immediate post procedural complication. The patient was escorted to have an upright chest radiograph.  FINDINGS: A total of approximately 1.4 liters of amber colored fluid was removed. Requested samples were sent to the laboratory.  IMPRESSION: Successful ultrasound-guided right sided thoracentesis yielding 1.4 liters of pleural fluid.  Read By:  Tsosie Billing PA-C   Electronically Signed   By: Lucrezia Europe M.D.   On: 10/11/2014 10:30    Cardiac Studies:  ECG:  SR RAD. Normal ST segments    Telemetry:  NSR  No arrhythmia   Echo:  EF 50-55% no pulmonary hypertension   Medications:   . amLODipine  10 mg Oral Daily  . antiseptic oral rinse  7 mL Mouth Rinse q12n4p  . aspirin EC  81 mg Oral Daily  . atorvastatin  40 mg Oral q1800  . carvedilol  25 mg Oral BID WC  . chlorhexidine  15 mL Mouth Rinse BID  . citalopram  20 mg Oral Daily  . clopidogrel  75 mg Oral Daily  . enoxaparin (LOVENOX) injection  40 mg Subcutaneous Q24H  . furosemide  80 mg Intravenous Q12H  . gabapentin  300 mg Oral Daily  . hydrALAZINE  25 mg Oral TID  . insulin aspart  0-9 Units Subcutaneous 6 times per day  . insulin detemir  22 Units Subcutaneous QHS  . isosorbide dinitrate  15 mg Oral Daily  . isosorbide dinitrate  30 mg Oral QPM  . isosorbide mononitrate  45 mg Oral Daily  . lisinopril  20 mg Oral Daily  . multivitamin with minerals  1 tablet Oral Daily  . potassium chloride  10 mEq Oral Daily  . sodium chloride  3 mL Intravenous Q12H  . Umeclidinium-Vilanterol  1 application Inhalation Daily       Assessment/Plan:  Pleural Effusion:  S/P thoracentesis  Required one last month.  Not clear that this is related to heart given unilateral nature and normal EF with No signs of pulmonary hypertension on ECho.  Continue Lasix transition to PO nexxt 48 hrs.  Given patient DNR status no further cardiac W/u planned.  If she has another recurrence would call CVTS to consider Pleur X catheter.  CAD:  Post  CABG no angina continue ASA/Plavix nitrates and beta blocker     Jenkins Rouge 10/12/2014, 9:19 AM

## 2014-10-13 DIAGNOSIS — Z515 Encounter for palliative care: Secondary | ICD-10-CM

## 2014-10-13 DIAGNOSIS — R001 Bradycardia, unspecified: Secondary | ICD-10-CM

## 2014-10-13 DIAGNOSIS — J9601 Acute respiratory failure with hypoxia: Secondary | ICD-10-CM

## 2014-10-13 LAB — BASIC METABOLIC PANEL
Anion gap: 6 (ref 5–15)
BUN: 36 mg/dL — AB (ref 6–20)
CALCIUM: 8.7 mg/dL — AB (ref 8.9–10.3)
CO2: 35 mmol/L — ABNORMAL HIGH (ref 22–32)
CREATININE: 1.39 mg/dL — AB (ref 0.44–1.00)
Chloride: 98 mmol/L — ABNORMAL LOW (ref 101–111)
GFR calc non Af Amer: 38 mL/min — ABNORMAL LOW (ref >60)
GFR, EST AFRICAN AMERICAN: 44 mL/min — AB (ref >60)
Glucose, Bld: 59 mg/dL — ABNORMAL LOW (ref 65–99)
Potassium: 4.5 mmol/L (ref 3.5–5.1)
SODIUM: 139 mmol/L (ref 135–145)

## 2014-10-13 LAB — GLUCOSE, CAPILLARY
GLUCOSE-CAPILLARY: 43 mg/dL — AB (ref 65–99)
GLUCOSE-CAPILLARY: 96 mg/dL (ref 65–99)
GLUCOSE-CAPILLARY: 136 mg/dL — AB (ref 65–99)
GLUCOSE-CAPILLARY: 148 mg/dL — AB (ref 65–99)
Glucose-Capillary: 249 mg/dL — ABNORMAL HIGH (ref 65–99)

## 2014-10-13 LAB — CBC
HCT: 28.5 % — ABNORMAL LOW (ref 36.0–46.0)
Hemoglobin: 9 g/dL — ABNORMAL LOW (ref 12.0–15.0)
MCH: 28.3 pg (ref 26.0–34.0)
MCHC: 31.6 g/dL (ref 30.0–36.0)
MCV: 89.6 fL (ref 78.0–100.0)
PLATELETS: 202 10*3/uL (ref 150–400)
RBC: 3.18 MIL/uL — AB (ref 3.87–5.11)
RDW: 15.3 % (ref 11.5–15.5)
WBC: 10.2 10*3/uL (ref 4.0–10.5)

## 2014-10-13 LAB — TROPONIN I: TROPONIN I: 0.04 ng/mL — AB (ref <0.031)

## 2014-10-13 MED ORDER — HYDROCODONE-ACETAMINOPHEN 5-325 MG PO TABS
2.0000 | ORAL_TABLET | Freq: Once | ORAL | Status: AC
  Administered 2014-10-13: 2 via ORAL
  Filled 2014-10-13: qty 2

## 2014-10-13 MED ORDER — HYDROCODONE-ACETAMINOPHEN 5-325 MG PO TABS
1.0000 | ORAL_TABLET | Freq: Four times a day (QID) | ORAL | Status: DC | PRN
  Administered 2014-10-13 – 2014-10-16 (×11): 1 via ORAL
  Administered 2014-10-16: 2 via ORAL
  Administered 2014-10-16: 1 via ORAL
  Administered 2014-10-17 – 2014-10-19 (×7): 2 via ORAL
  Filled 2014-10-13: qty 2
  Filled 2014-10-13 (×4): qty 1
  Filled 2014-10-13 (×3): qty 2
  Filled 2014-10-13: qty 1
  Filled 2014-10-13: qty 2
  Filled 2014-10-13 (×2): qty 1
  Filled 2014-10-13 (×2): qty 2
  Filled 2014-10-13 (×5): qty 1
  Filled 2014-10-13: qty 2
  Filled 2014-10-13: qty 1

## 2014-10-13 MED ORDER — IPRATROPIUM-ALBUTEROL 0.5-2.5 (3) MG/3ML IN SOLN
3.0000 mL | Freq: Four times a day (QID) | RESPIRATORY_TRACT | Status: DC
  Administered 2014-10-13 – 2014-10-17 (×17): 3 mL via RESPIRATORY_TRACT
  Filled 2014-10-13 (×18): qty 3

## 2014-10-13 MED ORDER — MAGNESIUM SULFATE IN D5W 10-5 MG/ML-% IV SOLN
1.0000 g | Freq: Once | INTRAVENOUS | Status: DC
  Filled 2014-10-13: qty 100

## 2014-10-13 MED ORDER — FERROUS SULFATE 325 (65 FE) MG PO TABS
325.0000 mg | ORAL_TABLET | Freq: Every day | ORAL | Status: DC
  Administered 2014-10-13 – 2014-10-19 (×7): 325 mg via ORAL
  Filled 2014-10-13 (×7): qty 1

## 2014-10-13 MED ORDER — INSULIN DETEMIR 100 UNIT/ML ~~LOC~~ SOLN
7.0000 [IU] | Freq: Every day | SUBCUTANEOUS | Status: DC
  Administered 2014-10-13 – 2014-10-18 (×6): 7 [IU] via SUBCUTANEOUS
  Filled 2014-10-13 (×7): qty 0.07

## 2014-10-13 MED ORDER — GABAPENTIN 300 MG PO CAPS
300.0000 mg | ORAL_CAPSULE | Freq: Every day | ORAL | Status: DC
  Administered 2014-10-13 – 2014-10-18 (×6): 300 mg via ORAL
  Filled 2014-10-13 (×6): qty 1

## 2014-10-13 NOTE — Progress Notes (Signed)
[facility] PROGRESS NOTE  [name] [name] WUG:[id number] DOB: 08-18-1945 DOA: 10/10/2014 PCP: Glenda Chroman., MD  Summary 69 yo female with chronic hypoxic respiratory failure, diastolic CHF, right pleural effusion tapped a month ago, PAD with chronic leg pain, DM admitted with acute respiratory failure secondary to pleural effusion and acute on chronic diastolic heart failure. Required BiPAP initially. Had thoracentesis yielding 1.4 L of transudate. Has been able to come off BiPAP, but had an episode of near arrest on 10/3 at which time patient was unresponsive, had a respiratory rate of about 5, hypoxic into the 60s on supplemental oxygen, and bradycardic into the 30s. She was placed on nonrebreather mask and gradually came around. All of her sedating medications have been held except for a single dose of pain medication last night given by covering provider. Coreg held. Lasix increased to due to poor response to 80 mg IV twice a day. Also, has had problems with hypoglycemia, and insulin has been scaled back.  Assessment/Plan:  Principal Problem: Unresponsive episode with hypoxia, bradycardia and bradypnea. Coreg has been held. Heart rate in the 40s and 50s. Oxygen saturations better. Chest x-ray essentially the same, possibly slight progression of pleural effusion. ABG with mild CO2 retention as well as increased a-a gradient but without acidosis. Troponins flat. EKG without ischemic changes. H&H stable. All sedating medications have been held, but patient is complaining of 9 out of 10 leg pain. This is been a chronic problem for her and she has peripheral artery disease and I suspect some neuropathy. According to the chaplain's note, she wanted to "give up permanently". I asked her whether she was interested in transitioning to comfort care. She is noncommittal but is interested in talking with palliative care. I do believe she would be hospice appropriate. Will consult palliative care for goals  of care and symptom management. She is already DO NOT RESUSCITATE. Will carefully add back pain medication. Watch for respiratory compromise or altered mental status.    Acute on chronic diastolic congestive heart failure ([place]): Lasix was increased yesterday. She is 4 L negative and weight is down to 194 pounds from 204. Continue diuresis   Active Problems:   Hypertension, controlled. Coreg held due to severe bradycardia yesterday     PAD (peripheral artery disease) [facility]): patient reports Dr. Gwenlyn Found to do a procedure on both legs at some point in the future.     Has necrotic eschar on right second toe.  wound consult recommends painting with Betadine only.   Patient complaining of chronic leg pain: PAD? Neuropathy? see above. Will resume hydrocodone as needed. Resume gabapentin daily at bedtime. Watch for sedation.   Diabetes mellitus type II, with hypoglycemia.  patient was hypoglycemic again this morning to 43. Currently 96. Will decrease Levemir further.    Pleural effusion, right, recurrent: s/p thoracentesis. Transudative. If recurs, consider pleurx (if patient wants to remain aggressive)    Acute on chronic respiratory failure with hypoxia ([place]): still on nonrebreather. May be able to change to Ventimask.     Anemia: no reported bleeding. Acute on chronic. anemia panel consistent with chronic disease and a component of iron deficiency. Will add iron supplementation. Hemoccults ordered. Hemoglobin has actually improved with diuresis.   Long-term prognosis appears quite guarded.    Code Status:  full Family Communication:  Pt is lucid. patient declines my offer to call family  Disposition Plan:  ?  Consultants:  Cardiology  Palliative pending  Procedures:     Antibiotics:  HPI/Subjective: C/o bilateral leg pain. Breathing improved. Voices interest in discussing potential transfer to comfort measures.   Objective: Filed Vitals:   10/13/14 0733  BP:   Pulse:    Temp: 98.2 F (36.8 C)  Resp:     Intake/Output Summary (Last 24 hours) at 10/13/14 0829 Last data filed at 10/13/14 0823  Gross per 24 hour  Intake    849 ml  Output   1275 ml  Net   -426 ml   Filed Weights   10/11/14 0424 10/12/14 0425 10/13/14 0400  Weight: 92.216 kg (203 lb 4.8 oz) 86.32 kg (190 lb 4.8 oz) 88.27 kg (194 lb 9.6 oz)    Exam:   General:  Weak appearing. No respiratory distress.  Cardiovascular: RRR without MGR  Respiratory: diminished throughout without WRR  Abdomen: s, nd, nd  Ext: necrotic eschar right second toe. Unable to palpate pedal pulses. Chronic venous stasis changes.  Basic Metabolic Panel:  Recent Labs Lab 10/10/14 2153 10/11/14 0527 10/12/14 0444 10/13/14 0440  NA 134*  --  139 139  K 4.2  --  3.7 4.5  CL 97*  --  97* 98*  CO2 29  --  34* 35*  GLUCOSE 256*  --  84 59*  BUN 35*  --  30* 36*  CREATININE 1.34* 1.28* 1.13* 1.39*  CALCIUM 8.5*  --  8.7* 8.7*  MG  --   --  2.1  --    Liver Function Tests:  Recent Labs Lab 10/12/14 0444  AST 24  ALT 23  ALKPHOS 69  BILITOT 0.7  PROT 6.4*  ALBUMIN 2.4*   No results for input(s): LIPASE, AMYLASE in the last 168 hours. No results for input(s): AMMONIA in the last 168 hours. CBC:  Recent Labs Lab 10/10/14 2153 10/11/14 0527 10/12/14 0444 10/12/14 1709 10/13/14 0440  WBC 11.2* 9.7 9.0  --  10.2  NEUTROABS 9.8*  --   --   --   --   HGB 9.3* 8.9* 8.6* 8.3* 9.0*  HCT 29.2* 28.3* 28.2* 27.4* 28.5*  MCV 89.8 89.3 89.5  --  89.6  PLT 190 175 190  --  202   Cardiac Enzymes:  Recent Labs Lab 10/11/14 1021 10/11/14 1555 10/12/14 1709 10/12/14 2138 10/13/14 0440  TROPONINI 0.06* 0.10* 0.04* 0.08* 0.04*   BNP (last 3 results)  Recent Labs  09/09/14 0245 10/10/14 2153  BNP 436.8* 914.0*    ProBNP (last 3 results) No results for input(s): PROBNP in the last 8760 hours.  CBG:  Recent Labs Lab 10/12/14 1135 10/12/14 1546 10/12/14 2049 10/13/14 0732  10/13/14 0802  GLUCAP 143* 130* 98 43* 96    Recent Results (from the past 240 hour(s))  Blood culture (routine x 2)     Status: None (Preliminary result)   Collection Time: 10/10/14  9:53 PM  Result Value Ref Range Status   Specimen Description RIGHT ANTECUBITAL  Final   Special Requests BOTTLES DRAWN AEROBIC AND ANAEROBIC 6CC  Final   Culture NO GROWTH 2 DAYS  Final   Report Status PENDING  Incomplete  Blood culture (routine x 2)     Status: None (Preliminary result)   Collection Time: 10/10/14 10:00 PM  Result Value Ref Range Status   Specimen Description BLOOD RIGHT ARM  Final   Special Requests BOTTLES DRAWN AEROBIC ONLY 6CC  Final   Culture NO GROWTH 2 DAYS  Final   Report Status PENDING  Incomplete  MRSA PCR Screening  Status: None   Collection Time: 10/11/14  4:16 AM  Result Value Ref Range Status   MRSA by PCR NEGATIVE NEGATIVE Final    Comment:        The GeneXpert MRSA Assay (FDA approved for NASAL specimens only), is one component of a comprehensive MRSA colonization surveillance program. It is not intended to diagnose MRSA infection nor to guide or monitor treatment for MRSA infections.   Culture, body fluid-bottle     Status: None (Preliminary result)   Collection Time: 10/11/14  9:57 AM  Result Value Ref Range Status   Specimen Description FLUID RIGHT PLEURAL  Final   Special Requests BAA 10CCS  Final   Culture NO GROWTH 1 DAY  Final   Report Status PENDING  Incomplete  Gram stain     Status: None   Collection Time: 10/11/14  9:57 AM  Result Value Ref Range Status   Specimen Description FLUID RIGHT PLEURAL  Final   Special Requests NONE  Final   Gram Stain   Final    MODERATE WBC PRESENT, PREDOMINANTLY PMN NO ORGANISMS SEEN    Report Status 10/11/2014 FINAL  Final     Studies: Dg Chest 1 View  10/12/2014   CLINICAL DATA:  Respiratory distress over the last day.  EXAM: CHEST 1 VIEW  COMPARISON:  10/11/2014 and previous  FINDINGS: Previous  median sternotomy and CABG. Persistent cardiomegaly. Venous hypertension with interstitial and alveolar edema. Residual pleural fluid on the right with associated volume loss in the right lung. No pneumothorax. Possible slight reaccumulation of the right effusions since yesterday.  IMPRESSION: Persistent evidence of congestive heart failure with interstitial and alveolar edema. Right effusion, previously tapped. Possible slight reaccumulation of the right effusion. Associated atelectasis in the right lung secondary to the fusion.   Electronically Signed   By: Nelson Chimes M.D.   On: 10/12/2014 16:22   Dg Chest 1 View  10/11/2014   CLINICAL DATA:  Post right thoracentesis.  Short of breath  EXAM: CHEST 1 VIEW  COMPARISON:  10/10/2014  FINDINGS: Significant decrease in right pleural effusion. Small right effusion and right lower lobe atelectasis remains. No pneumothorax  Cardiac enlargement.  Congestive heart failure with mild edema.  IMPRESSION: Improvement in right pleural effusion. Small residual right effusion and right lower lobe atelectasis. No pneumothorax  Congestive heart failure with mild edema.   Electronically Signed   By: Franchot Gallo M.D.   On: 10/11/2014 10:06   US Thoracentesis Asp Pleural Space W/img Guide  10/11/2014   INDICATION: Symptomatic right sided pleural effusion  EXAM: US THORACENTESIS ASP PLEURAL SPACE W/IMG GUIDE  COMPARISON:  CXR 10/11/14.  MEDICATIONS: None  COMPLICATIONS: None immediate  TECHNIQUE: Informed written consent was obtained from the patient after a discussion of the risks, benefits and alternatives to treatment. A timeout was performed prior to the initiation of the procedure.  Initial ultrasound scanning demonstrates a right pleural effusion. The lower chest was prepped and draped in the usual sterile fashion. 1% lidocaine was used for local anesthesia.  Under direct ultrasound guidance, a 19 gauge, 7-cm, Yueh catheter was introduced. An ultrasound image was saved  for documentation purposes. The thoracentesis was performed. The catheter was removed and a dressing was applied. The patient tolerated the procedure well without immediate post procedural complication. The patient was escorted to have an upright chest radiograph.  FINDINGS: A total of approximately 1.4 liters of amber colored fluid was removed. Requested samples were sent to the laboratory.  IMPRESSION: Successful ultrasound-guided right sided thoracentesis yielding 1.4 liters of pleural fluid.  Read By:  Tsosie Billing PA-C   Electronically Signed   By: Lucrezia Europe M.D.   On: 10/11/2014 10:30    Scheduled Meds: . amLODipine  10 mg Oral Daily  . antiseptic oral rinse  7 mL Mouth Rinse q12n4p  . aspirin EC  81 mg Oral Daily  . atorvastatin  40 mg Oral q1800  . chlorhexidine  15 mL Mouth Rinse BID  . citalopram  20 mg Oral Daily  . clopidogrel  75 mg Oral Daily  . enoxaparin (LOVENOX) injection  40 mg Subcutaneous Q24H  . furosemide  120 mg Intravenous BID  . hydrALAZINE  25 mg Oral TID  . insulin aspart  0-9 Units Subcutaneous TID WC  . insulin detemir  15 Units Subcutaneous QHS  . isosorbide mononitrate  45 mg Oral Daily  . lisinopril  20 mg Oral Daily  . multivitamin with minerals  1 tablet Oral Daily  . potassium chloride  10 mEq Oral BID  . sodium chloride  3 mL Intravenous Q12H  . Umeclidinium-Vilanterol  1 application Inhalation Daily   Continuous Infusions:   Time spent: 35 minutes  [facility] [email or web address], password [facility] 10/13/2014, 8:29 AM  LOS: 3 days

## 2014-10-13 NOTE — Evaluation (Signed)
Physical Therapy Evaluation Patient Details Name: [name] [name] MRN: [id number] DOB: 1945/02/03 Today's Date: 10/13/2014   History of Present Illness  Pt is a 69 y/o woman with a PMH htn, hlp, DM2, CAD s/p CABG 2440, chronic diastolic CHF EF 10%, pulmonary hypertension. She was recently admitted with acute on chronic respiratory failure complicated by diastolic heart failure and volume overload. Patient diuresed 5 pounds and also underwent ultrasound-guided thoracentesis. 10/3 she apparently had respiratory failure with bradycardia down to 36.   Clinical Impression  Pt admitted with above diagnosis. Pt currently with functional limitations due to the deficits listed below (see PT Problem List). At the time of PT eval pt was able to perform transfers with min assist for balance/support, and VC's for technique. Pt will benefit from skilled PT to increase their independence and safety with mobility to allow discharge to the venue listed below. If pt does not make progress towards functional goals next session, may want to consider a higher level of care.     Follow Up Recommendations Home health PT;Supervision for mobility/OOB    Equipment Recommendations  None recommended by PT    Recommendations for Other Services       Precautions / Restrictions Precautions Precautions: Fall Restrictions Weight Bearing Restrictions: No      Mobility  Bed Mobility Overal bed mobility: Needs Assistance Bed Mobility: Supine to Sit;Sit to Supine     Supine to sit: Min assist Sit to supine: Min assist   General bed mobility comments: Assist to elevate trunk to full sitting position, as well as elevate LE's back into the bed.   Transfers Overall transfer level: Needs assistance Equipment used: 1 person hand held assist Transfers: Sit to/from Stand Sit to Stand: Min assist         General transfer comment: Pt with improved balance with face-to-face transfer to standing. With therapist standing  to the side she had difficulty balancing with only 1 HHA. Gait belt required for safety.   Ambulation/Gait             General Gait Details: Pt completed pre-gait activity EOB with marching in place and weight shifting. She was not able to tolerate stepping away from the bed due to fatigue, and pt was returned to sitting for a rest.   Stairs            Wheelchair Mobility    Modified Rankin (Stroke Patients Only)       Balance Overall balance assessment: Needs assistance Sitting-balance support: Feet supported;No upper extremity supported Sitting balance-Leahy Scale: Fair     Standing balance support: No upper extremity supported;During functional activity Standing balance-Leahy Scale: Zero Standing balance comment: Pt requires assist as well as UE support to maintain standing balance.                              Pertinent Vitals/Pain Pain Assessment: No/denies pain    Home Living Family/patient expects to be discharged to:: Private residence Living Arrangements: Children;Other relatives Available Help at Discharge: Family;Available 24 hours/day Type of Home: House Home Access: Stairs to enter Entrance Stairs-Rails: None Entrance Stairs-Number of Steps: 1 + 1 Home Layout: One level Home Equipment: Walker - 2 wheels;Walker - 4 wheels;Cane - single point;Shower seat;Bedside commode Additional Comments: Pt reports that she lives with children, adult grandson. As they work and are gone during the days, pt states she could call her sister to come stay with her  if needed.    Prior Function Level of Independence: Independent with assistive device(s)         Comments: Pt reports she uses cane or RW depending on how she is feeling     Hand Dominance   Dominant Hand: Right    Extremity/Trunk Assessment   Upper Extremity Assessment: Defer to OT evaluation           Lower Extremity Assessment: Overall WFL for tasks assessed (Strength 5/5 on L  and 4+/5 to 5/5 on the R)      Cervical / Trunk Assessment: Other exceptions  Communication   Communication: No difficulties  Cognition Arousal/Alertness: Awake/alert Behavior During Therapy: WFL for tasks assessed/performed Overall Cognitive Status: Within Functional Limits for tasks assessed                      General Comments      Exercises        Assessment/Plan    PT Assessment Patient needs continued PT services  PT Diagnosis Difficulty walking;Generalized weakness   PT Problem List Decreased strength;Decreased range of motion;Decreased activity tolerance;Decreased balance;Decreased mobility;Decreased knowledge of use of DME;Decreased safety awareness;Decreased knowledge of precautions  PT Treatment Interventions DME instruction;Gait training;Stair training;Functional mobility training;Therapeutic activities;Therapeutic exercise;Neuromuscular re-education;Patient/family education   PT Goals (Current goals can be found in the Care Plan section) Acute Rehab PT Goals Patient Stated Goal: Return home at d/c PT Goal Formulation: With patient Time For Goal Achievement: 10/27/14 Potential to Achieve Goals: Good    Frequency Min 3X/week   Barriers to discharge        Co-evaluation               End of Session Equipment Utilized During Treatment: Gait belt Activity Tolerance: Patient limited by fatigue Patient left: in bed;with call bell/phone within reach;with bed alarm set Nurse Communication: Mobility status         Time: 1017-5102 PT Time Calculation (min) (ACUTE ONLY): 30 min   Charges:   PT Evaluation $Initial PT Evaluation Tier I: 1 Procedure PT Treatments $Therapeutic Activity: 8-22 mins   PT G Codes:        Rolinda Roan 2014-10-31, 10:55 AM   Rolinda Roan, PT, DPT Acute Rehabilitation Services Pager: [phone]

## 2014-10-13 NOTE — Progress Notes (Signed)
Schorr, NP notified of pt c/o 9/10 pain in legs, positive troponin results and update on respiratory status. Pt does not wish to take tylenol available as she feels it will not help. Pt not in respiratory distress throughout night despite dimimished lung sounds with fine crackles. New order received for one time pain med. Will administer and continue to monitor closely.

## 2014-10-13 NOTE — Care Management Note (Addendum)
Case Management Note  Patient Details  Name: [name] [name] MRN: [id number] Date of Birth: 25-Dec-1945  Subjective/Objective:   Pt admitted for Acute on Chronic Diastolic Heart Failure. Pt is from home on 02 at 2L. Pt is active with [facility] for [facility] RN/PT Services.                Action/Plan: Palliative Care Consulting. CM will continue to monitor for disposition needs.  Expected Discharge Date:                  Expected Discharge Plan:  [place]  In-House Referral:  NA  Discharge planning Services  CM Consult  Post Acute Care Choice:  Home Health, Resumption of Svcs/PTA Provider Choice offered to:     DME Arranged:    DME Agency:     HH Arranged:  RN, PT [place] Agency:  [place]  Status of Service:  In process, will continue to follow  Medicare Important Message Given:  Yes-second notification given Date Medicare IM Given:    Medicare IM give by:    Date Additional Medicare IM Given:    Additional Medicare Important Message give by:     If discussed at [place] of Stay Meetings, dates discussed:    Additional Comments: [place] 10-16-14 Jacqlyn Krauss, RN, BSN [phone] CM did fill out some of Pleurx Drain Form. CM has set pt up with [facility] services via [facility]. PT recommendations for SNF at this time. CSW is aware. Plan for Weekend CM to look at yellow form to complete. No further needs from this CM.   Bethena Roys, RN 10/13/2014, 4:08 PM

## 2014-10-13 NOTE — Progress Notes (Signed)
[facility]  Patient Status: Active (receiving services up to time of hospitalization)  [facility] is providing the following services: RN and PT  If patient discharges after hours, please call [phone].   [name] [name] 10/13/2014, 3:23 PM

## 2014-10-13 NOTE — Progress Notes (Signed)
Subjective:  Awake today, on facemask O2  Objective:  Vital Signs in the last 24 hours: Temp:  [98.2 F (36.8 C)-98.6 F (37 C)] 98.2 F (36.8 C) (10/04 0733) Pulse Rate:  [46-52] 52 (10/04 0400) Resp:  [13-25] 15 (10/04 0400) BP: (127-175)/(38-48) 142/45 mmHg (10/04 0400) SpO2:  [74 %-99 %] 95 % (10/04 0400) FiO2 (%):  [45 %-50 %] 45 % (10/03 1805) Weight:  [194 lb 9.6 oz (88.27 kg)] 194 lb 9.6 oz (88.27 kg) (10/04 0400)  Intake/Output from previous day:  Intake/Output Summary (Last 24 hours) at 10/13/14 1021 Last data filed at 10/13/14 0823  Gross per 24 hour  Intake    480 ml  Output   1275 ml  Net   -795 ml    Physical Exam: General appearance: cooperative, no distress and moderately obese Neck: bilateral CE scars Lungs: decreased 1/3 up on Rt and crackles Lt base Heart: regular rate and rhythm Abdomen: soft, non-tender; bowel sounds normal; no masses,  no organomegaly Extremities: 1+ edema with chronic venous edema skin changes Skin: pale, moist Neurologic: Grossly normal   Rate: 52  Rhythm: sinus bradycardia  Lab Results:  Recent Labs  10/12/14 0444 10/12/14 1709 10/13/14 0440  WBC 9.0  --  10.2  HGB 8.6* 8.3* 9.0*  PLT 190  --  202    Recent Labs  10/12/14 0444 10/13/14 0440  NA 139 139  K 3.7 4.5  CL 97* 98*  CO2 34* 35*  GLUCOSE 84 59*  BUN 30* 36*  CREATININE 1.13* 1.39*    Recent Labs  10/12/14 2138 10/13/14 0440  TROPONINI 0.08* 0.04*   No results for input(s): INR in the last 72 hours.  Scheduled Meds: . amLODipine  10 mg Oral Daily  . antiseptic oral rinse  7 mL Mouth Rinse q12n4p  . aspirin EC  81 mg Oral Daily  . atorvastatin  40 mg Oral q1800  . chlorhexidine  15 mL Mouth Rinse BID  . citalopram  20 mg Oral Daily  . clopidogrel  75 mg Oral Daily  . enoxaparin (LOVENOX) injection  40 mg Subcutaneous Q24H  . ferrous sulfate  325 mg Oral Q breakfast  . furosemide  120 mg Intravenous BID  . gabapentin  300 mg Oral  QHS  . hydrALAZINE  25 mg Oral TID  . insulin aspart  0-9 Units Subcutaneous TID WC  . insulin detemir  7 Units Subcutaneous QHS  . isosorbide mononitrate  45 mg Oral Daily  . lisinopril  20 mg Oral Daily  . multivitamin with minerals  1 tablet Oral Daily  . potassium chloride  10 mEq Oral BID  . sodium chloride  3 mL Intravenous Q12H  . Umeclidinium-Vilanterol  1 application Inhalation Daily   Continuous Infusions:  PRN Meds:.sodium chloride, acetaminophen, albuterol, HYDROcodone-acetaminophen, nitroGLYCERIN, ondansetron (ZOFRAN) IV, sodium chloride   Imaging: Dg Chest 1 View  10/12/2014   CLINICAL DATA:  Respiratory distress over the last day.  EXAM: CHEST 1 VIEW  COMPARISON:  10/11/2014 and previous  FINDINGS: Previous median sternotomy and CABG. Persistent cardiomegaly. Venous hypertension with interstitial and alveolar edema. Residual pleural fluid on the right with associated volume loss in the right lung. No pneumothorax. Possible slight reaccumulation of the right effusions since yesterday.  IMPRESSION: Persistent evidence of congestive heart failure with interstitial and alveolar edema. Right effusion, previously tapped. Possible slight reaccumulation of the right effusion. Associated atelectasis in the right lung secondary to the fusion.   Electronically  Signed   By: Nelson Chimes M.D.   On: 10/12/2014 16:22    Cardiac Studies: Echo 09/09/14 Study Conclusions  - Left ventricle: The cavity size was normal. Wall thickness was normal. Systolic function was normal. The estimated ejection fraction was in the range of 50% to 55%. Wall motion was normal; there were no regional wall motion abnormalities. - Left atrium: The atrium was mildly dilated.  Assessment/Plan:  69 y/o woman with pmh htn, hlp, DM2, CAD s/p CABG 6754, chronic diastolic CHF EF 49% , pulmonary hypertension. She follows up with Dr. Domenic Polite and was recently admitted for with acute on chronic respiratory failure  complicated by diastolic heart failure and volume overload. Patient diuresed 5 pounds and also underwent ultrasound-guided thoracentesis. Last PM she apparently had respiratory failure with bradycardia down to 36. On exam her Rt effusion has recurred.   Principal Problem:   Acute respiratory failure with hypoxia (HCC) Active Problems:   Pleural effusion, right   Acute on chronic diastolic congestive heart failure (HCC)   Unresponsive episode   Bradycardia, sinus   Hypertension, uncontrolled   Hx of CABG 2012   Diabetes mellitus type II, uncontrolled ([place])   PAD (peripheral artery disease) (HCC)   Anemia   Toe ulcer ([place])   PLAN: Palliative Care ordered. She may need pleur X cath placed.   Kerin Ransom PA-C 10/13/2014, 10:21 AM [phone]  Not much to add from cardiology perspective.  Event yesterday afternoon seems mediated By meds and respitory status not primary arrhythmia  Beta blocker held  If she is palliative Care referral for Pleur X catheter probably not necessary  Jenkins Rouge

## 2014-10-13 NOTE — Progress Notes (Signed)
Hypoglycemic Event  CBG: 43  Treatment: 15 GM carbohydrate snack  Symptoms: None  Follow-up CBG: [id number] CBG Result:95  Possible Reasons for Event: Inadequate meal intake  Comments/MD notified:DR. Leda Roys  Remember to initiate Hypoglycemia Order Set & complete

## 2014-10-13 NOTE — Consult Note (Signed)
Consultation Note Date: 10/13/2014   Patient Name: [name] [name]  DOB: 12-21-1945  MRN: [id number]  Age / Sex: 69 y.o., female   PCP: Glenda Chroman, MD Referring Physician: Delfina Redwood, MD  Reason for Consultation: Establishing goals of care  Palliative Care Assessment and Plan Summary of Established Goals of Care and Medical Treatment Preferences    Palliative Care Discussion Held Today:   I met today with [name] [name] who is very forward with me that she does not WANT to die but feels like this is the direction she is heading. She says she is so very tired. She fears that her children think she is giving up. She tells me that she has numerous reasons to want to live (mainly talks of her grandchildren). Her family seem very supportive from what she says but she also does not want to be a burden on them. She lives with her son, daughter-in-law, and grandson (for past ~9 months) and they often work nights and her daughters have offered to stay with her at night but she does not like to take them up on their offer.   She is very insightful in the fact that she tells me that she comes to the hospital and back a month later with no real improvements and that she is not making any progress. She tells me she had hospice care with her husband who died from cancer 2001-03-09 and was very pleased with the care he received from them. She is interested in hospice care for herself HOWEVER is more interested in continuing physical therapy in hopes of some amount of recovery for now. We discussed a plan to optimize her health and continue "to try" and if she declines or fails we can always engage hospice and a more comfort approach -  she is accepting of this plan.   We discussed her leg pain which she describes as cramping beginning in her toes and moving up towards her thighs (denies burning, stabbing, tingling, spasm) that is constant. She says that she will wait until it is 7/10 before asking for pain  medication and that with medication it will decrease to 3/10 after ~30-45 min which is tolerable. She does not want to compromise her respiratory status for increased pain control. She would also be interested in PleurX for management of her SOB if indicated (daughter-in-law in ER RN and could help her manage).   I offered to help her discuss this with her family so that they do not feel like she is giving up now or in the future but she is tired and would like to wait and discuss a meeting further tomorrow.    Contacts/Participants in Discussion: Primary Decision Maker: Self   Goals of Care/Code Status/Advance Care Planning:   Code Status: DNR   Symptom Management:   Bowel regimen:   Leg Pain: Continue gabapentin and hydrocodone-acetaminophen prn.   Dyspnea: Continue oxygen and therapies per attending. Consider benefit of PleurX.   Psycho-social/Spiritual:   Support System: Very good.   Prognosis: Likely very poor - < 6 months.   Discharge Planning:  Return home with home health, PT with possible transition to hospice eventually.        Chief Complaint/HPI: 69 yo female with chronic hypoxic respiratory failure, diastolic heart failure, recurrent right pleural effusion, PAD with chronic leg pain. Admitted with SOB with complicating episode of unresponsive, bradycardia, hypoxia yesterday with little improvement.   Primary Diagnoses  Present on Admission:  .  PAD (peripheral artery disease) ([place]) . Diabetes mellitus type II, uncontrolled ([place]) . Hypertension, uncontrolled . (Resolved) Acute on chronic combined systolic and diastolic heart failure ([place]) . Pleural effusion, right . Acute on chronic diastolic congestive heart failure ([place]) . Anemia . Toe ulcer ([place]) . Acute respiratory failure with hypoxia (HCC)  Palliative Review of Systems:   + chronic bilateral leg pain, + SOB   I have reviewed the medical record, interviewed the patient and family, and examined the  patient. The following aspects are pertinent.  Past Medical History  Diagnosis Date  . Mixed hyperlipidemia   . Essential hypertension, benign   . Depression   . Cellulitis     a. Recurrent, bilateral legs  . Carotid artery disease (HCC)     a. Bilateral CEA; 50-60% bilateral ICA stenosis, 11/12  . PAD (peripheral artery disease) ([place])   . Suicide attempt ([place]) 08/2002  . TIA (transient ischemic attack)   . TMJ syndrome   . Herniated disc   . Ischemic cardiomyopathy     a. 11/2010 TEE: EF 40-45%.  . Critical lower limb ischemia   . Venous insufficiency   . Coronary atherosclerosis of native coronary artery     a. 11/2010 NSTEMI/CABG x 4: L-LAD; S-PL; S-OM; S-DX, by PVT.  [name] NSTEMI (non-ST elevated myocardial infarction) ([place]) 11/2010  . Pneumonia 2000's X 2  . Type 2 diabetes mellitus ([place])   . History of blood transfusion   . Migraine   . Stroke ([place]) 08/2009  . Arthritis   . Chronic back pain   . On home oxygen therapy   . Chronic diastolic heart failure [facility])    Social History   Social History  . Marital Status: Widowed    Spouse Name: N/A  . Number of Children: N/A  . Years of Education: N/A   Social History Main Topics  . Smoking status: Former Smoker -- 3.00 packs/day for 50 years    Types: Cigarettes    Start date: 01/24/1956    Quit date: 10/09/2009  . Smokeless tobacco: Never Used  . Alcohol Use: 0.0 oz/week    0 Standard drinks or equivalent per week     Comment: 10/06/2013 "might have a drink q 2-3 months"  . Drug Use: Yes    Special: Marijuana     Comment: "smoked pot in my teens"  . Sexual Activity: Not Currently   Other Topics Concern  . None   Social History Narrative   Lives in [place] by herself.  She does not work.   Family History  Problem Relation Age of Onset  . Coronary artery disease Father     Died with MI age 22  . Heart disease Father   . Heart attack Father   . Diabetes type II Mother   . Hypertension Mother   . Diabetes Mother    . Heart disease Mother   . Hyperlipidemia Mother   . Heart failure Sister   . Cancer Sister   . Cancer Brother     Lung cancer  . Diabetes Daughter   . Hyperlipidemia Daughter   . Diabetes Son   . Hyperlipidemia Son    Scheduled Meds: . amLODipine  10 mg Oral Daily  . antiseptic oral rinse  7 mL Mouth Rinse q12n4p  . aspirin EC  81 mg Oral Daily  . atorvastatin  40 mg Oral q1800  . chlorhexidine  15 mL Mouth Rinse BID  . citalopram  20 mg Oral Daily  .  clopidogrel  75 mg Oral Daily  . enoxaparin (LOVENOX) injection  40 mg Subcutaneous Q24H  . ferrous sulfate  325 mg Oral Q breakfast  . furosemide  120 mg Intravenous BID  . gabapentin  300 mg Oral QHS  . hydrALAZINE  25 mg Oral TID  . insulin aspart  0-9 Units Subcutaneous TID WC  . insulin detemir  7 Units Subcutaneous QHS  . ipratropium-albuterol  3 mL Nebulization Q6H  . isosorbide mononitrate  45 mg Oral Daily  . lisinopril  20 mg Oral Daily  . multivitamin with minerals  1 tablet Oral Daily  . potassium chloride  10 mEq Oral BID  . sodium chloride  3 mL Intravenous Q12H   Continuous Infusions:  PRN Meds:.sodium chloride, acetaminophen, HYDROcodone-acetaminophen, nitroGLYCERIN, ondansetron (ZOFRAN) IV, sodium chloride Medications Prior to Admission:  Prior to Admission medications   Medication Sig Start Date End Date Taking? Authorizing Provider  acetaminophen (TYLENOL) 500 MG tablet Take 1,000 mg by mouth every 6 (six) hours as needed.   Yes Historical Provider, MD  amLODipine (NORVASC) 10 MG tablet Take 1 tablet (10 mg total) by mouth daily. 07/09/14  Yes Satira Sark, MD  aspirin 81 MG EC tablet Take 81 mg by mouth daily. 03/29/13  Yes Brittainy Erie Noe, PA-C  atorvastatin (LIPITOR) 40 MG tablet Take 40 mg by mouth daily at 6 PM. 03/29/13  Yes Brittainy Erie Noe, PA-C  carvedilol (COREG) 25 MG tablet Take 1 tablet (25 mg total) by mouth 2 (two) times daily with a meal. 09/15/14  Yes Satira Sark, MD    citalopram (CELEXA) 20 MG tablet Take 20 mg by mouth daily.   Yes Historical Provider, MD  clopidogrel (PLAVIX) 75 MG tablet Take 1 tablet (75 mg total) by mouth daily with breakfast. 03/16/14  Yes Satira Sark, MD  Docusate Calcium (STOOL SOFTENER PO) Take 100 mg by mouth daily as needed (for constipation).    Yes Historical Provider, MD  gabapentin (NEURONTIN) 300 MG capsule Take 300 mg by mouth daily. 08/02/14  Yes Historical Provider, MD  glimepiride (AMARYL) 2 MG tablet Take 2 mg by mouth 2 (two) times daily.    Yes Historical Provider, MD  hydrALAZINE (APRESOLINE) 25 MG tablet Take 25 mg by mouth 3 (three) times daily.   Yes Historical Provider, MD  HYDROcodone-acetaminophen (NORCO/VICODIN) 5-325 MG per tablet Take 1 tablet by mouth every 6 (six) hours as needed for moderate pain. Patient taking differently: Take 0.5-1 tablets by mouth every 6 (six) hours as needed for moderate pain.  09/12/14  Yes Charlynne Cousins, MD  insulin aspart (NOVOLOG) 100 UNIT/ML injection Inject 0-8 Units into the skin 3 (three) times daily as needed for high blood sugar. >150 = 2 units, >200 = 4 units, >250 = 6 units, >300 = 8 units 03/29/13  Yes Brittainy M Simmons, PA-C  insulin detemir (LEVEMIR) 100 UNIT/ML injection Inject 22 Units into the skin at bedtime.  03/29/13  Yes Brittainy Erie Noe, PA-C  isosorbide dinitrate (ISORDIL) 30 MG tablet Take 15-30 mg by mouth 2 (two) times daily. Takes 15 mg in the 30 in the evening.   Yes Historical Provider, MD  isosorbide dinitrate (ISORDIL) 30 MG tablet Take 15-30 mg by mouth 2 (two) times daily. Takes 15 mg in the morning and 30 mg in the evening.   Yes Historical Provider, MD  isosorbide mononitrate (IMDUR) 60 MG 24 hr tablet Take 1 tablet (60 mg total) by mouth daily. Patient taking  differently: Take by mouth daily.  10/05/14  Yes Satira Sark, MD  lisinopril (PRINIVIL,ZESTRIL) 20 MG tablet Take 1 tablet (20 mg total) by mouth daily. 06/01/14  Yes Satira Sark, MD  Multiple Vitamin (MULTIVITAMIN WITH MINERALS) TABS tablet Take 1 tablet by mouth daily.   Yes Historical Provider, MD  Multiple Vitamins-Minerals (HAIR/SKIN/NAILS) TABS Take 1 tablet by mouth daily.   Yes Historical Provider, MD  potassium chloride (K-DUR) 10 MEQ tablet Take 1 tablet (10 mEq total) by mouth daily. 06/25/14  Yes Satira Sark, MD  torsemide (DEMADEX) 20 MG tablet Take 2 tablets (40 mg total) by mouth every morning. & 20 mg in the evening. You may take an extra tablet in the evening if your weight increases 3 lbs in a day until your weight is back down to around 195 lbs, then resume previous dosage Patient taking differently: Take 20-40 mg by mouth 2 (two) times daily. Every day patient takes 2 tablets in the morning and 1 tablet in the evening. May take an extra tablet in the evening if your weight increases 3 lbs in a day until your weight is back down to around 195 lbs, then resume previous dosage 10/02/14  Yes Satira Sark, MD  Umeclidinium-Vilanterol 62.5-25 MCG/INH AEPB Inhale 1 application into the lungs daily.    Yes Historical Provider, MD  albuterol (PROVENTIL HFA;VENTOLIN HFA) 108 (90 BASE) MCG/ACT inhaler Inhale 2 puffs into the lungs daily as needed for wheezing or shortness of breath.     Historical Provider, MD  nitroGLYCERIN (NITROSTAT) 0.4 MG SL tablet Place 1 tablet (0.4 mg total) under the tongue every 5 (five) minutes as needed for chest pain. Up to 3 doses. If no relief after 3rd dose, proceed to the ED for an evaluation 09/30/14   Satira Sark, MD   Allergies  Allergen Reactions  . Other Shortness Of Breath, Rash and Other (See Comments)    All berries   . Strawberry Hives, Shortness Of Breath and Rash  . Sulfa Antibiotics Swelling    Bodily Swelling  . Tape Other (See Comments)    Tears skin.  Please use "paper" tape only.   CBC:    Component Value Date/Time   WBC 10.2 10/13/2014 0440   HGB 9.0* 10/13/2014 0440   HCT 28.5*  10/13/2014 0440   PLT 202 10/13/2014 0440   MCV 89.6 10/13/2014 0440   NEUTROABS 9.8* 10/10/2014 2153   LYMPHSABS 0.7 10/10/2014 2153   MONOABS 0.7 10/10/2014 2153   EOSABS 0.0 10/10/2014 2153   BASOSABS 0.0 10/10/2014 2153   Comprehensive Metabolic Panel:    Component Value Date/Time   NA 139 10/13/2014 0440   K 4.5 10/13/2014 0440   CL 98* 10/13/2014 0440   CO2 35* 10/13/2014 0440   BUN 36* 10/13/2014 0440   CREATININE 1.39* 10/13/2014 0440   CREATININE 1.06 02/25/2014 1704   GLUCOSE 59* 10/13/2014 0440   CALCIUM 8.7* 10/13/2014 0440   AST 24 10/12/2014 0444   ALT 23 10/12/2014 0444   ALKPHOS 69 10/12/2014 0444   BILITOT 0.7 10/12/2014 0444   PROT 6.4* 10/12/2014 0444   ALBUMIN 2.4* 10/12/2014 0444    Physical Exam:  Vital Signs: BP 142/45 mmHg  Pulse 52  Temp(Src) 98.1 F (36.7 C) (Axillary)  Resp 15  Ht _0  (1.676 m)  Wt 88.27 kg (194 lb 9.6 oz)  BMI 31.42 kg/m2  SpO2 95% SpO2: SpO2: 95 % O2 Device: O2 Device: Partial Rebreather  Mask O2 Flow Rate: O2 Flow Rate (L/min): 100 L/min Intake/output summary:  Intake/Output Summary (Last 24 hours) at 10/13/14 1519 Last data filed at 10/13/14 1124  Gross per 24 hour  Intake    480 ml  Output   1825 ml  Net  -1345 ml   LBM: Last BM Date: 10/11/14 Baseline Weight: Weight: 90.719 kg (200 lb) Most recent weight: Weight: 88.27 kg (194 lb 9.6 oz)  Exam Findings:  General: NAD, fatigued CVS: Bradycardic Resp: Partial NRB, SOB with conversation Neuro: Awake, alert, oriented x 3           Palliative Performance Scale: 20 %                Additional Data Reviewed: Recent Labs     10/12/14  0444  10/12/14  1709  10/13/14  0440  WBC  9.0   --   10.2  HGB  8.6*  8.3*  9.0*  PLT  190   --   202  NA  139   --   139  BUN  30*   --   36*  CREATININE  1.13*   --   1.39*     Time In: 1415 Time Out: 1530 Time Total: 73mn  Greater than 50%  of this time was spent counseling and coordinating care related  to the above assessment and plan.   Signed by:  AVinie Sill NP Palliative Medicine Team Pager # [phone](M-F 8a-5p) Team Phone # [phone](Nights/Weekends)

## 2014-10-13 NOTE — Care Management Important Message (Signed)
Important Message  Patient Details  Name: [name] [name] MRN: [id number] Date of Birth: January 21, 1945   Medicare Important Message Given:  Yes-second notification given    Nathen May 10/13/2014, 2:02 PM

## 2014-10-13 NOTE — Progress Notes (Signed)
Addendum:  Discussed with Ms. Jerline Pain, Dr. Johnsie Cancel, Dr. Luan Pulling. Pt not ready for hospice and wants to try PT. Would benefit from pleurx.  Second thoracentesis in a month, and CXR 10/3 shows recurrence. Patient agreeable.  Will consult IR.  Followed by Dr. Luan Pulling for pulmonary issues. He is willing to manage pleurx as outpatient.  Will need home RN for drainage.  Doree Barthel, MD

## 2014-10-14 ENCOUNTER — Encounter (HOSPITAL_COMMUNITY): Payer: Self-pay | Admitting: Physician Assistant

## 2014-10-14 DIAGNOSIS — J90 Pleural effusion, not elsewhere classified: Secondary | ICD-10-CM | POA: Insufficient documentation

## 2014-10-14 DIAGNOSIS — J948 Other specified pleural conditions: Secondary | ICD-10-CM

## 2014-10-14 DIAGNOSIS — I5033 Acute on chronic diastolic (congestive) heart failure: Secondary | ICD-10-CM

## 2014-10-14 DIAGNOSIS — Z951 Presence of aortocoronary bypass graft: Secondary | ICD-10-CM

## 2014-10-14 DIAGNOSIS — D508 Other iron deficiency anemias: Secondary | ICD-10-CM

## 2014-10-14 DIAGNOSIS — I739 Peripheral vascular disease, unspecified: Secondary | ICD-10-CM

## 2014-10-14 LAB — BASIC METABOLIC PANEL
Anion gap: 9 (ref 5–15)
BUN: 32 mg/dL — ABNORMAL HIGH (ref 6–20)
CALCIUM: 8.5 mg/dL — AB (ref 8.9–10.3)
CHLORIDE: 92 mmol/L — AB (ref 101–111)
CO2: 34 mmol/L — ABNORMAL HIGH (ref 22–32)
CREATININE: 1.26 mg/dL — AB (ref 0.44–1.00)
GFR, EST AFRICAN AMERICAN: 50 mL/min — AB (ref >60)
GFR, EST NON AFRICAN AMERICAN: 43 mL/min — AB (ref >60)
Glucose, Bld: 97 mg/dL (ref 65–99)
Potassium: 4.4 mmol/L (ref 3.5–5.1)
SODIUM: 135 mmol/L (ref 135–145)

## 2014-10-14 LAB — CBC
HCT: 28.9 % — ABNORMAL LOW (ref 36.0–46.0)
Hemoglobin: 9 g/dL — ABNORMAL LOW (ref 12.0–15.0)
MCH: 28 pg (ref 26.0–34.0)
MCHC: 31.1 g/dL (ref 30.0–36.0)
MCV: 89.8 fL (ref 78.0–100.0)
PLATELETS: 235 10*3/uL (ref 150–400)
RBC: 3.22 MIL/uL — AB (ref 3.87–5.11)
RDW: 15.1 % (ref 11.5–15.5)
WBC: 11.5 10*3/uL — AB (ref 4.0–10.5)

## 2014-10-14 LAB — GLUCOSE, CAPILLARY
GLUCOSE-CAPILLARY: 88 mg/dL (ref 65–99)
GLUCOSE-CAPILLARY: 89 mg/dL (ref 65–99)
GLUCOSE-CAPILLARY: 194 mg/dL — AB (ref 65–99)
GLUCOSE-CAPILLARY: 204 mg/dL — AB (ref 65–99)

## 2014-10-14 LAB — PLATELET INHIBITION P2Y12: PLATELET FUNCTION P2Y12: 249 [PRU] (ref 194–418)

## 2014-10-14 MED ORDER — CEFAZOLIN SODIUM-DEXTROSE 2-3 GM-% IV SOLR
2.0000 g | INTRAVENOUS | Status: AC
  Administered 2014-10-15: 2 g via INTRAVENOUS

## 2014-10-14 MED ORDER — CEFAZOLIN (ANCEF) 1 G IV SOLR: 2.0000 g | INTRAVENOUS | Status: DC

## 2014-10-14 MED ORDER — HEPARIN SODIUM (PORCINE) 5000 UNIT/ML IJ SOLN
5000.0000 [IU] | Freq: Three times a day (TID) | INTRAMUSCULAR | Status: DC
  Administered 2014-10-14 – 2014-10-19 (×15): 5000 [IU] via SUBCUTANEOUS
  Filled 2014-10-14 (×14): qty 1

## 2014-10-14 NOTE — H&P (Signed)
Chief Complaint:  Shortness of breath Recurrent pleural effusion  Referring Physician(s): SULLIVAN, CORINNA L  History of Present Illness: [name] [name] is a 69 y.o. female with DM2, CAD s/p CABG 4818, chronic diastolic CHF EF 59% , pulmonary hypertension.   She was recently admitted for with acute on chronic respiratory failure complicated by diastolic heart failure and volume overload.   She was diuresed 5 pounds and also underwent ultrasound-guided thoracentesis.   On 10/3 PM she apparently had respiratory failure with bradycardia down to 36. On exam her Rt effusion has recurred.   She is now considering palliative care and we are asked to place a tunneled pleural catheter so she can manage her pleural effusion at home.  Past Medical History  Diagnosis Date  . Mixed hyperlipidemia   . Essential hypertension, benign   . Depression   . Cellulitis     a. Recurrent, bilateral legs  . Carotid artery disease (HCC)     a. Bilateral CEA; 50-60% bilateral ICA stenosis, 11/12  . PAD (peripheral artery disease) ([place])   . Suicide attempt ([place]) 08/2002  . TIA (transient ischemic attack)   . TMJ syndrome   . Herniated disc   . Ischemic cardiomyopathy     a. 11/2010 TEE: EF 40-45%.  . Critical lower limb ischemia   . Venous insufficiency   . Coronary atherosclerosis of native coronary artery     a. 11/2010 NSTEMI/CABG x 4: L-LAD; S-PL; S-OM; S-DX, by PVT.  [name] NSTEMI (non-ST elevated myocardial infarction) ([place]) 11/2010  . Pneumonia 2000's X 2  . Type 2 diabetes mellitus ([place])   . History of blood transfusion   . Migraine   . Stroke ([place]) 08/2009  . Arthritis   . Chronic back pain   . On home oxygen therapy   . Chronic diastolic heart failure [facility])     Past Surgical History  Procedure Laterality Date  . Appendectomy    . Cholecystectomy    . Abdominal hysterectomy    . Carotid endarterectomy Bilateral 2011    Bilateral - Dr. Kellie Simmering  . Wrist fracture surgery Left    "grafted bone from hip to wrist"  . Coronary artery bypass graft  11/24/2010    Procedure: CORONARY ARTERY BYPASS GRAFTING (CABG);  Surgeon: Tharon Aquas Adelene Idler, MD;  Location: [place];  Service: Open Heart Surgery;  Laterality: N/A;  Coronary Artery Bypass Graft times four on pump utilizing left internal mammary artery and bilateral greater saphenous veins harvested endoscopically, transesophageal echocardiogram   . Toe surgery Left     "put pin in 2nd toe"  . Lower extremity angiogram  09/08/2012; 10/06/2013    "found 100% blockage; unsuccessful attempt at crossing a chronic total occlusion of the left SFA in the setting of critical limb ischemia  . Dilation and curettage of uterus    . Tubal ligation    . Debridement toe Left     "nonhealing wound; 3rd digit  . Cardiac catheterization  11/2010  . Coronary angioplasty with stent placement  03/2013    "1"  . Colonoscopy w/ biopsies and polypectomy    . Fracture surgery    . Femoral-popliteal bypass graft Left 10/07/2013    Procedure: LEFT FEMORAL-POPLITEAL ARTERY BYPASS GRAFT;  Surgeon: Angelia Mould, MD;  Location: [place];  Service: Vascular;  Laterality: Left;  . Intraoperative arteriogram Left 10/07/2013    Procedure: INTRA OPERATIVE ARTERIOGRAM LEFT LEG;  Surgeon: Angelia Mould, MD;  Location: [place];  Service: Vascular;  Laterality: Left;  . Endarterectomy popliteal Left 10/07/2013    Procedure: LEFT POPLITEAL ENDARTERECTOMY ;  Surgeon: Angelia Mould, MD;  Location: [place];  Service: Vascular;  Laterality: Left;  . Left and right heart catheterization with coronary/graft angiogram N/A 03/27/2013    Procedure: LEFT AND RIGHT HEART CATHETERIZATION WITH Beatrix Fetters;  Surgeon: Burnell Blanks, MD;  Location: [facility] CATH LAB;  Service: Cardiovascular;  Laterality: N/A;  . Lower extremity angiogram Bilateral 09/08/2013    Procedure: LOWER EXTREMITY ANGIOGRAM;  Surgeon: [name], MD;  Location: [facility] CATH LAB;   Service: Cardiovascular;  Laterality: Bilateral;    Allergies: Other; Strawberry; Sulfa antibiotics; and Tape  Medications: Prior to Admission medications   Medication Sig Start Date End Date Taking? Authorizing Provider  acetaminophen (TYLENOL) 500 MG tablet Take 1,000 mg by mouth every 6 (six) hours as needed.   Yes Historical Provider, MD  amLODipine (NORVASC) 10 MG tablet Take 1 tablet (10 mg total) by mouth daily. 07/09/14  Yes Satira Sark, MD  aspirin 81 MG EC tablet Take 81 mg by mouth daily. 03/29/13  Yes Brittainy Erie Noe, PA-C  atorvastatin (LIPITOR) 40 MG tablet Take 40 mg by mouth daily at 6 PM. 03/29/13  Yes Brittainy Erie Noe, PA-C  carvedilol (COREG) 25 MG tablet Take 1 tablet (25 mg total) by mouth 2 (two) times daily with a meal. 09/15/14  Yes Satira Sark, MD  citalopram (CELEXA) 20 MG tablet Take 20 mg by mouth daily.   Yes Historical Provider, MD  clopidogrel (PLAVIX) 75 MG tablet Take 1 tablet (75 mg total) by mouth daily with breakfast. 03/16/14  Yes Satira Sark, MD  Docusate Calcium (STOOL SOFTENER PO) Take 100 mg by mouth daily as needed (for constipation).    Yes Historical Provider, MD  gabapentin (NEURONTIN) 300 MG capsule Take 300 mg by mouth daily. 08/02/14  Yes Historical Provider, MD  glimepiride (AMARYL) 2 MG tablet Take 2 mg by mouth 2 (two) times daily.    Yes Historical Provider, MD  hydrALAZINE (APRESOLINE) 25 MG tablet Take 25 mg by mouth 3 (three) times daily.   Yes Historical Provider, MD  HYDROcodone-acetaminophen (NORCO/VICODIN) 5-325 MG per tablet Take 1 tablet by mouth every 6 (six) hours as needed for moderate pain. Patient taking differently: Take 0.5-1 tablets by mouth every 6 (six) hours as needed for moderate pain.  09/12/14  Yes Charlynne Cousins, MD  insulin aspart (NOVOLOG) 100 UNIT/ML injection Inject 0-8 Units into the skin 3 (three) times daily as needed for high blood sugar. >150 = 2 units, >200 = 4 units, >250 = 6 units, >300 =  8 units 03/29/13  Yes Brittainy M Simmons, PA-C  insulin detemir (LEVEMIR) 100 UNIT/ML injection Inject 22 Units into the skin at bedtime.  03/29/13  Yes Brittainy Erie Noe, PA-C  isosorbide dinitrate (ISORDIL) 30 MG tablet Take 15-30 mg by mouth 2 (two) times daily. Takes 15 mg in the 30 in the evening.   Yes Historical Provider, MD  isosorbide dinitrate (ISORDIL) 30 MG tablet Take 15-30 mg by mouth 2 (two) times daily. Takes 15 mg in the morning and 30 mg in the evening.   Yes Historical Provider, MD  isosorbide mononitrate (IMDUR) 60 MG 24 hr tablet Take 1 tablet (60 mg total) by mouth daily. Patient taking differently: Take by mouth daily.  10/05/14  Yes Satira Sark, MD  lisinopril (PRINIVIL,ZESTRIL) 20 MG tablet Take 1 tablet (20 mg  total) by mouth daily. 06/01/14  Yes Satira Sark, MD  Multiple Vitamin (MULTIVITAMIN WITH MINERALS) TABS tablet Take 1 tablet by mouth daily.   Yes Historical Provider, MD  Multiple Vitamins-Minerals (HAIR/SKIN/NAILS) TABS Take 1 tablet by mouth daily.   Yes Historical Provider, MD  potassium chloride (K-DUR) 10 MEQ tablet Take 1 tablet (10 mEq total) by mouth daily. 06/25/14  Yes Satira Sark, MD  torsemide (DEMADEX) 20 MG tablet Take 2 tablets (40 mg total) by mouth every morning. & 20 mg in the evening. You may take an extra tablet in the evening if your weight increases 3 lbs in a day until your weight is back down to around 195 lbs, then resume previous dosage Patient taking differently: Take 20-40 mg by mouth 2 (two) times daily. Every day patient takes 2 tablets in the morning and 1 tablet in the evening. May take an extra tablet in the evening if your weight increases 3 lbs in a day until your weight is back down to around 195 lbs, then resume previous dosage 10/02/14  Yes Satira Sark, MD  Umeclidinium-Vilanterol 62.5-25 MCG/INH AEPB Inhale 1 application into the lungs daily.    Yes Historical Provider, MD  albuterol (PROVENTIL HFA;VENTOLIN  HFA) 108 (90 BASE) MCG/ACT inhaler Inhale 2 puffs into the lungs daily as needed for wheezing or shortness of breath.     Historical Provider, MD  nitroGLYCERIN (NITROSTAT) 0.4 MG SL tablet Place 1 tablet (0.4 mg total) under the tongue every 5 (five) minutes as needed for chest pain. Up to 3 doses. If no relief after 3rd dose, proceed to the ED for an evaluation 09/30/14   Satira Sark, MD     Family History  Problem Relation Age of Onset  . Coronary artery disease Father     Died with MI age 28  . Heart disease Father   . Heart attack Father   . Diabetes type II Mother   . Hypertension Mother   . Diabetes Mother   . Heart disease Mother   . Hyperlipidemia Mother   . Heart failure Sister   . Cancer Sister   . Cancer Brother     Lung cancer  . Diabetes Daughter   . Hyperlipidemia Daughter   . Diabetes Son   . Hyperlipidemia Son     Social History   Social History  . Marital Status: Widowed    Spouse Name: N/A  . Number of Children: N/A  . Years of Education: N/A   Social History Main Topics  . Smoking status: Former Smoker -- 3.00 packs/day for 50 years    Types: Cigarettes    Start date: 01/24/1956    Quit date: 10/09/2009  . Smokeless tobacco: Never Used  . Alcohol Use: 0.0 oz/week    0 Standard drinks or equivalent per week     Comment: 10/06/2013 "might have a drink q 2-3 months"  . Drug Use: Yes    Special: Marijuana     Comment: "smoked pot in my teens"  . Sexual Activity: Not Currently   Other Topics Concern  . None   Social History Narrative   Lives in [place] by herself.  She does not work.     Review of Systems  Constitutional: Positive for activity change, appetite change and fatigue. Negative for fever and chills.  Respiratory: Positive for cough, chest tightness, shortness of breath and wheezing.   Cardiovascular: Negative for chest pain.  Gastrointestinal: Negative for nausea, vomiting, abdominal  pain and abdominal distention.    Musculoskeletal: Negative.   Skin: Negative.   Neurological: Negative.   Psychiatric/Behavioral: Negative.     Vital Signs: BP 137/54 mmHg  Pulse 56  Temp(Src) 98.3 F (36.8 C) (Oral)  Resp 24  Ht _0  (1.676 m)  Wt 193 lb 8 oz (87.771 kg)  BMI 31.25 kg/m2  SpO2 90%  Physical Exam  Constitutional: She is oriented to person, place, and time. She appears well-developed and well-nourished.  HENT:  Head: Normocephalic and atraumatic.  Eyes: EOM are normal.  Neck: Normal range of motion. Neck supple.  Pulmonary/Chest:  On O2 via NRB.  Appears to struggle a bit with breathing but is able to talk to me. She does get SOB with talking. Left lung sound clear, Right lung breath sounds diminished c/w pleural effusion.   Abdominal: Soft. Bowel sounds are normal.  Musculoskeletal: Normal range of motion.  Neurological: She is alert and oriented to person, place, and time.  Skin: Skin is warm and dry.  Psychiatric: She has a normal mood and affect. Her behavior is normal. Judgment and thought content normal.  Vitals reviewed.   Mallampati Score:  MD Evaluation Airway: WNL Heart: WNL Abdomen: WNL Chest/ Lungs: Other (comments) Chest/ lungs comments: Diminshed breath sounds on the right c/w pleural effusion. On O2 via NRB ASA  Classification: 3 Mallampati/Airway Score: Two  Imaging: Dg Chest 1 View  10/12/2014   CLINICAL DATA:  Respiratory distress over the last day.  EXAM: CHEST 1 VIEW  COMPARISON:  10/11/2014 and previous  FINDINGS: Previous median sternotomy and CABG. Persistent cardiomegaly. Venous hypertension with interstitial and alveolar edema. Residual pleural fluid on the right with associated volume loss in the right lung. No pneumothorax. Possible slight reaccumulation of the right effusions since yesterday.  IMPRESSION: Persistent evidence of congestive heart failure with interstitial and alveolar edema. Right effusion, previously tapped. Possible slight reaccumulation of  the right effusion. Associated atelectasis in the right lung secondary to the fusion.   Electronically Signed   By: Nelson Chimes M.D.   On: 10/12/2014 16:22   Dg Chest 1 View  10/11/2014   CLINICAL DATA:  Post right thoracentesis.  Short of breath  EXAM: CHEST 1 VIEW  COMPARISON:  10/10/2014  FINDINGS: Significant decrease in right pleural effusion. Small right effusion and right lower lobe atelectasis remains. No pneumothorax  Cardiac enlargement.  Congestive heart failure with mild edema.  IMPRESSION: Improvement in right pleural effusion. Small residual right effusion and right lower lobe atelectasis. No pneumothorax  Congestive heart failure with mild edema.   Electronically Signed   By: Franchot Gallo M.D.   On: 10/11/2014 10:06   Dg Chest 2 View  10/10/2014   CLINICAL DATA:  Shortness of breath since yesterday  EXAM: CHEST  2 VIEW  COMPARISON:  09/08/2014  FINDINGS: Large right pleural effusion obscuring the majority of the right lung.  Cardiomegaly and mild pulmonary edema.  Status post CABG.  IMPRESSION: 1. Large right pleural effusion obscuring most of the right lung. 2. Cardiomegaly and mild pulmonary edema.   Electronically Signed   By: Monte Fantasia M.D.   On: 10/10/2014 21:58   US Thoracentesis Asp Pleural Space W/img Guide  10/11/2014   INDICATION: Symptomatic right sided pleural effusion  EXAM: US THORACENTESIS ASP PLEURAL SPACE W/IMG GUIDE  COMPARISON:  CXR 10/11/14.  MEDICATIONS: None  COMPLICATIONS: None immediate  TECHNIQUE: Informed written consent was obtained from the patient after a discussion of the risks, benefits  and alternatives to treatment. A timeout was performed prior to the initiation of the procedure.  Initial ultrasound scanning demonstrates a right pleural effusion. The lower chest was prepped and draped in the usual sterile fashion. 1% lidocaine was used for local anesthesia.  Under direct ultrasound guidance, a 19 gauge, 7-cm, Yueh catheter was introduced. An ultrasound  image was saved for documentation purposes. The thoracentesis was performed. The catheter was removed and a dressing was applied. The patient tolerated the procedure well without immediate post procedural complication. The patient was escorted to have an upright chest radiograph.  FINDINGS: A total of approximately 1.4 liters of amber colored fluid was removed. Requested samples were sent to the laboratory.  IMPRESSION: Successful ultrasound-guided right sided thoracentesis yielding 1.4 liters of pleural fluid.  Read By:  Tsosie Billing PA-C   Electronically Signed   By: Lucrezia Europe M.D.   On: 10/11/2014 10:30    Labs:  CBC:  Recent Labs  10/11/14 0527 10/12/14 0444 10/12/14 1709 10/13/14 0440 10/14/14 0540  WBC 9.7 9.0  --  10.2 11.5*  HGB 8.9* 8.6* 8.3* 9.0* 9.0*  HCT 28.3* 28.2* 27.4* 28.5* 28.9*  PLT 175 190  --  202 235    COAGS: No results for input(s): INR, APTT in the last 8760 hours.  BMP:  Recent Labs  10/10/14 2153 10/11/14 0527 10/12/14 0444 10/13/14 0440 10/14/14 0540  NA 134*  --  139 139 135  K 4.2  --  3.7 4.5 4.4  CL 97*  --  97* 98* 92*  CO2 29  --  34* 35* 34*  GLUCOSE 256*  --  84 59* 97  BUN 35*  --  30* 36* 32*  CALCIUM 8.5*  --  8.7* 8.7* 8.5*  CREATININE 1.34* 1.28* 1.13* 1.39* 1.26*  GFRNONAA 40* 42* 49* 38* 43*  GFRAA 46* 49* 57* 44* 50*    LIVER FUNCTION TESTS:  Recent Labs  09/06/14 1157 10/12/14 0444  BILITOT  --  0.7  AST  --  24  ALT  --  23  ALKPHOS  --  69  PROT 6.2* 6.4*  ALBUMIN  --  2.4*    TUMOR MARKERS: No results for input(s): AFPTM, CEA, CA199, CHROMGRNA in the last 8760 hours.  Assessment and Plan:  Recurrent pleural effusion.  Patient considering palliative care.  Will place tunneled pleural catheter on Monday 10/19/2014 by Dr. Barbie Banner.  I have contacted cardiology and they have Ok'd holding Plavix x 5 days for planned procedure.   *I have discontinued the Plavix for now and it can be restarted on Tuesday Oct  11*  Risks and Benefits discussed with the patient including, but not limited to bleeding, hemoptysis, respiratory failure requiring intubation, infection, pneumothorax requiring chest tube placement, stroke from air embolism or even death.  All of the patient's questions were answered, patient is agreeable to proceed. Consent signed and in chart.  Thank you for this interesting consult.  I greatly enjoyed meeting [employer] and look forward to participating in their care.  A copy of this report was sent to the requesting provider on this date.  Signed: Murrell Redden PA-C 10/14/2014, 11:12 AM   I spent a total of 40 Minutes  in face to face in clinical consultation, greater than 50% of which was counseling/coordinating care for placement of tunneled pleural catheter.

## 2014-10-14 NOTE — Progress Notes (Signed)
Subjective:  She seems comfortable on face mask O2.  Objective:  Vital Signs in the last 24 hours: Temp:  [96.9 F (36.1 C)-99.2 F (37.3 C)] 98.3 F (36.8 C) (10/05 0327) Pulse Rate:  [44-58] 56 (10/05 0327) Resp:  [15-24] 24 (10/05 0327) BP: (125-142)/(34-54) 137/54 mmHg (10/05 0327) SpO2:  [90 %-99 %] 90 % (10/05 0327) FiO2 (%):  [55 %] 55 % (10/05 0757) Weight:  [193 lb 8 oz (87.771 kg)] 193 lb 8 oz (87.771 kg) (10/05 0327)  Intake/Output from previous day:  Intake/Output Summary (Last 24 hours) at 10/14/14 0907 Last data filed at 10/14/14 0805  Gross per 24 hour  Intake    189 ml  Output   2000 ml  Net  -1811 ml    Physical Exam: General appearance: alert, cooperative and no distress Neck: no JVD Lungs: dereased Rt 1/3 Heart: regular rate and rhythm Extremities: chronic skin changes, no significant edema Skin: pale, cool, dry Neurologic: Grossly normal   Rate: 57  Rhythm: normal sinus rhythm, sinus bradycardia and 7 bts NSVT followed by a sinus beat, then 4 beats NSVT last pm.   Lab Results:  Recent Labs  10/13/14 0440 10/14/14 0540  WBC 10.2 11.5*  HGB 9.0* 9.0*  PLT 202 235    Recent Labs  10/13/14 0440 10/14/14 0540  NA 139 135  K 4.5 4.4  CL 98* 92*  CO2 35* 34*  GLUCOSE 59* 97  BUN 36* 32*  CREATININE 1.39* 1.26*    Recent Labs  10/12/14 2138 10/13/14 0440  TROPONINI 0.08* 0.04*   No results for input(s): INR in the last 72 hours.  Scheduled Meds: . amLODipine  10 mg Oral Daily  . antiseptic oral rinse  7 mL Mouth Rinse q12n4p  . aspirin EC  81 mg Oral Daily  . atorvastatin  40 mg Oral q1800  . chlorhexidine  15 mL Mouth Rinse BID  . citalopram  20 mg Oral Daily  . ferrous sulfate  325 mg Oral Q breakfast  . furosemide  120 mg Intravenous BID  . gabapentin  300 mg Oral QHS  . hydrALAZINE  25 mg Oral TID  . insulin aspart  0-9 Units Subcutaneous TID WC  . insulin detemir  7 Units Subcutaneous QHS  .  ipratropium-albuterol  3 mL Nebulization Q6H  . isosorbide mononitrate  45 mg Oral Daily  . lisinopril  20 mg Oral Daily  . multivitamin with minerals  1 tablet Oral Daily  . potassium chloride  10 mEq Oral BID  . sodium chloride  3 mL Intravenous Q12H   Continuous Infusions:  PRN Meds:.sodium chloride, acetaminophen, HYDROcodone-acetaminophen, nitroGLYCERIN, ondansetron (ZOFRAN) IV, sodium chloride   Imaging:  CXR 10/12/14 COMPARISON: 10/11/2014 and previous  FINDINGS: Previous median sternotomy and CABG. Persistent cardiomegaly. Venous hypertension with interstitial and alveolar edema. Residual pleural fluid on the right with associated volume loss in the right lung. No pneumothorax. Possible slight reaccumulation of the right effusions since yesterday.  IMPRESSION: Persistent evidence of congestive heart failure with interstitial and alveolar edema. Right effusion, previously tapped. Possible slight reaccumulation of the right effusion. Associated atelectasis in the right lung secondary to the fusion.   Cardiac Studies: Echo 09/09/14 Study Conclusions  - Left ventricle: The cavity size was normal. Wall thickness was normal. Systolic function was normal. The estimated ejection fraction was in the range of 50% to 55%. Wall motion was normal; there were no regional wall motion abnormalities. - Left atrium: The atrium  was mildly dilated.   Assessment/Plan:  69 y/o woman with pmh htn, hlp, DM2, CAD s/p CABG 6837, chronic diastolic CHF EF 29% , pulmonary hypertension. She follows up with Dr. Domenic Polite and was recently admitted for with acute on chronic respiratory failure complicated by diastolic heart failure and volume overload. Patient diuresed 5 pounds and also underwent ultrasound-guided thoracentesis. 10/3 PM she apparently had respiratory failure with bradycardia down to 36. On exam her Rt effusion has recurred. Last PM she had runs of NSVT.  Principal Problem:    Acute respiratory failure with hypoxia (HCC) Active Problems:   Pleural effusion, right   Acute on chronic diastolic congestive heart failure (HCC)   Unresponsive episode   Bradycardia, sinus   Hypertension, uncontrolled   Hx of CABG 2012   Diabetes mellitus type II, uncontrolled ([place])   PAD (peripheral artery disease) ([place])   Anemia   Toe ulcer ([place])   Palliative care encounter   PLAN: Palliative Care consult noted. Pt is DNR but wants to continue therapy and try to return home. Pleur X catheter suggested, OK to hold Plavix for this. Nothing to do about NSVT. She can't tolerate beta blocker, electrolytes WNL.   Kerin Ransom PA-C 10/14/2014, 9:07 AM [phone]  Depressed:  Barely been OOB.  R basilar effusion.  Not clear to me why Pleurex has to be put off until Monday Plavix should not be that big of an issue.  Can check P2Y to see if it is low.  LE;s with chronic venous stasis Small ulcer on right foot.  NSVT asymptomatic 7 beats just before midnight on telemetry Beta blocker dose has  Been decreased for bradycardia.    Jenkins Rouge

## 2014-10-14 NOTE — Progress Notes (Signed)
Client had a run of VT 11 beats. She is asymptomatic and vital signs are stable. Will continue to monitor

## 2014-10-14 NOTE — Progress Notes (Signed)
PROGRESS NOTE  [name] [name] HYW:[id number] DOB: 10-12-1945 DOA: 10/10/2014 PCP: Glenda Chroman., MD   HPI: 69 yo female with chronic hypoxic respiratory failure, diastolic CHF, right pleural effusion tapped a month ago, PAD with chronic leg pain, DM admitted with acute respiratory failure secondary to pleural effusion and acute on chronic diastolic heart failure. Required BiPAP initially. Had thoracentesis yielding 1.4 L of transudate. Has been able to come off BiPAP, but had an episode of near arrest on 10/3 at which time patient was unresponsive, had a respiratory rate of about 5, hypoxic into the 60s on supplemental oxygen, and bradycardic into the 30s. She was placed on nonrebreather mask and gradually came around. All of her sedating medications have been held. Coreg held. Lasix increased to due to poor response to 80 mg IV twice a day. Also, has had problems with hypoglycemia, and insulin has been scaled back.  Subjective / 24 H Interval events - continues to feel quite dyspneic  Assessment/Plan: Principal Problem:   Acute respiratory failure with hypoxia (HCC) Active Problems:   Hypertension, uncontrolled   PAD (peripheral artery disease) ([place])   Hx of CABG 2012   Diabetes mellitus type II, uncontrolled (HCC)   Pleural effusion, right   Acute on chronic diastolic congestive heart failure (HCC)   Anemia   Toe ulcer (HCC)   Unresponsive episode   Bradycardia, sinus   Palliative care encounter   Pleural effusion   Unresponsive episode with hypoxia, bradycardia and bradypnea.  - Coreg has been held. Heart rate in the 40s and 50s.  - Oxygen saturations better however still requiring venturi mask with 55% FIO2. Chest x-ray essentially the same, possibly slight progression of pleural effusion. ABG on 10/3 with mild CO2 retention as well as increased a-a gradient but without acidosis.  - Troponins flat. EKG without ischemic changes. H&H stable.  - All sedating medications have been  held,  - chronic leg pain seems improved today - According to the chaplain's note, she wanted to "give up permanently". Palliative following.   Acute on chronic respiratory failure with hypoxia  - now on Ventimask, wean off as tolerated - continue diuresis as below   Acute on chronic diastolic congestive heart failure  - Lasix was increased on 10/3 to 120 mg BID - weight down 7 lbs, neg negative 5L  Hypertension, controlled - Coreg held due to severe bradycardia  PAD (peripheral artery disease)  - patient reports Dr. Gwenlyn Found to do a procedure on both legs at some point in the future.  - Has necrotic eschar on right second toe.Wound consult recommends painting with Betadine only.   Patient complaining of chronic leg pain - PAD? Neuropathy? see above. Will resume hydrocodone as needed. Resume gabapentin daily at bedtime. Watch for sedation.   Diabetes mellitus type II, with hypoglycemia - patient with intermittent hypoglycemia, Levemir adjusted, now on 7 U, no further hypoglycemia today    Pleural effusion, right, recurrent - s/p thoracentesis. - recurrent issue, Pleurex pending Plavix washout, Monday per IR - no malignancy seen on cytology   Anemia - no reported bleeding. Acute on chronic. anemia panel consistent with chronic disease and a component of iron deficiency. Will add iron supplementation. Hemoccults ordered. Hemoglobin has actually improved with diuresis.   Long-term prognosis appears quite guarded.    Diet: Diet NPO time specified Except for: Sips with Meds Diet heart healthy/carb modified Room service appropriate?: Yes; Fluid consistency:: Thin Fluids: none  DVT Prophylaxis: heparin  Code Status: DNR  Family Communication: no family bedside  Disposition Plan: TBD  Consultants:  Cardiology   IR  Procedures:  None    Antibiotics  Anti-infectives    Start     Dose/Rate Route Frequency Ordered Stop   10/19/14 0800  ceFAZolin (ANCEF) IVPB 2 g/50 mL  premix     2 g 100 mL/hr over 30 Minutes Intravenous To Radiology 10/14/14 1145 10/20/14 0800   10/19/14 0000  ceFAZolin (ANCEF) powder 2 g  Status:  Discontinued    Comments:  Give in IR for surgical prophylaxis   2 g Other To Surgery 10/14/14 1132 10/14/14 1144       Studies  Dg Chest 1 View  10/12/2014   CLINICAL DATA:  Respiratory distress over the last day.  EXAM: CHEST 1 VIEW  COMPARISON:  10/11/2014 and previous  FINDINGS: Previous median sternotomy and CABG. Persistent cardiomegaly. Venous hypertension with interstitial and alveolar edema. Residual pleural fluid on the right with associated volume loss in the right lung. No pneumothorax. Possible slight reaccumulation of the right effusions since yesterday.  IMPRESSION: Persistent evidence of congestive heart failure with interstitial and alveolar edema. Right effusion, previously tapped. Possible slight reaccumulation of the right effusion. Associated atelectasis in the right lung secondary to the fusion.   Electronically Signed   By: Nelson Chimes M.D.   On: 10/12/2014 16:22    Objective  Filed Vitals:   10/13/14 2348 10/14/14 0104 10/14/14 0327 10/14/14 1127  BP: 133/44  137/54 144/41  Pulse: 51  56 57  Temp: 99.2 F (37.3 C)  98.3 F (36.8 C)   TempSrc: Axillary  Oral   Resp: _0 Height:      Weight:   87.771 kg (193 lb 8 oz)   SpO2: 93% 94% 90%     Intake/Output Summary (Last 24 hours) at 10/14/14 1331 Last data filed at 10/14/14 0805  Gross per 24 hour  Intake    189 ml  Output   1450 ml  Net  -1261 ml   Filed Weights   10/12/14 0425 10/13/14 0400 10/14/14 0327  Weight: 86.32 kg (190 lb 4.8 oz) 88.27 kg (194 lb 9.6 oz) 87.771 kg (193 lb 8 oz)    Exam:  GENERAL: in mild distress, tachypneic  HEENT: head NCAT, no scleral icterus. Pupils round and reactive. Mucous membranes are moist. Posterior pharynx clear of any exudate or lesions.  NECK: Supple. No carotid bruits. No lymphadenopathy or  thyromegaly.  LUNGS: Clear to auscultation. No wheezing or crackles  HEART: Regular rate and rhythm without murmur. 2+ pulses, no JVD, no peripheral edema  ABDOMEN: Soft, nontender, and nondistended. Positive bowel sounds. No hepatosplenomegaly was noted.  EXTREMITIES: Without any cyanosis, clubbing, rash, lesions or edema.  NEUROLOGIC: Alert and oriented x3. Cranial nerves II through XII are grossly intact. Strength 5/5 in all 4.  PSYCHIATRIC: Normal mood and affect  SKIN: No ulceration or induration present.  Data Reviewed: Basic Metabolic Panel:  Recent Labs Lab 10/10/14 2153 10/11/14 0527 10/12/14 0444 10/13/14 0440 10/14/14 0540  NA 134*  --  139 139 135  K 4.2  --  3.7 4.5 4.4  CL 97*  --  97* 98* 92*  CO2 29  --  34* 35* 34*  GLUCOSE 256*  --  84 59* 97  BUN 35*  --  30* 36* 32*  CREATININE 1.34* 1.28* 1.13* 1.39* 1.26*  CALCIUM 8.5*  --  8.7* 8.7* 8.5*  MG  --   --  2.1  --   --    Liver Function Tests:  Recent Labs Lab 10/12/14 0444  AST 24  ALT 23  ALKPHOS 69  BILITOT 0.7  PROT 6.4*  ALBUMIN 2.4*   CBC:  Recent Labs Lab 10/10/14 2153 10/11/14 0527 10/12/14 0444 10/12/14 1709 10/13/14 0440 10/14/14 0540  WBC 11.2* 9.7 9.0  --  10.2 11.5*  NEUTROABS 9.8*  --   --   --   --   --   HGB 9.3* 8.9* 8.6* 8.3* 9.0* 9.0*  HCT 29.2* 28.3* 28.2* 27.4* 28.5* 28.9*  MCV 89.8 89.3 89.5  --  89.6 89.8  PLT 190 175 190  --  202 235   Cardiac Enzymes:  Recent Labs Lab 10/11/14 1021 10/11/14 1555 10/12/14 1709 10/12/14 2138 10/13/14 0440  TROPONINI 0.06* 0.10* 0.04* 0.08* 0.04*   BNP (last 3 results)  Recent Labs  09/09/14 0245 10/10/14 2153  BNP 436.8* 914.0*   CBG:  Recent Labs Lab 10/13/14 1105 10/13/14 1634 10/13/14 2021 10/14/14 0734 10/14/14 1135  GLUCAP 136* 148* 249* 88 89    Recent Results (from the past 240 hour(s))  Blood culture (routine x 2)     Status: None (Preliminary result)   Collection Time: 10/10/14  9:53 PM    Result Value Ref Range Status   Specimen Description BLOOD RIGHT ANTECUBITAL  Final   Special Requests BOTTLES DRAWN AEROBIC AND ANAEROBIC 6CC  Final   Culture NO GROWTH 4 DAYS  Final   Report Status PENDING  Incomplete  Blood culture (routine x 2)     Status: None (Preliminary result)   Collection Time: 10/10/14 10:00 PM  Result Value Ref Range Status   Specimen Description BLOOD RIGHT ARM  Final   Special Requests BOTTLES DRAWN AEROBIC ONLY 6CC  Final   Culture NO GROWTH 4 DAYS  Final   Report Status PENDING  Incomplete  MRSA PCR Screening     Status: None   Collection Time: 10/11/14  4:16 AM  Result Value Ref Range Status   MRSA by PCR NEGATIVE NEGATIVE Final    Comment:        The GeneXpert MRSA Assay (FDA approved for NASAL specimens only), is one component of a comprehensive MRSA colonization surveillance program. It is not intended to diagnose MRSA infection nor to guide or monitor treatment for MRSA infections.   Culture, body fluid-bottle     Status: None (Preliminary result)   Collection Time: 10/11/14  9:57 AM  Result Value Ref Range Status   Specimen Description FLUID RIGHT PLEURAL  Final   Special Requests BAA 10CCS  Final   Culture NO GROWTH 3 DAYS  Final   Report Status PENDING  Incomplete  Gram stain     Status: None   Collection Time: 10/11/14  9:57 AM  Result Value Ref Range Status   Specimen Description FLUID RIGHT PLEURAL  Final   Special Requests NONE  Final   Gram Stain   Final    MODERATE WBC PRESENT, PREDOMINANTLY PMN NO ORGANISMS SEEN    Report Status 10/11/2014 FINAL  Final     Scheduled Meds: . amLODipine  10 mg Oral Daily  . antiseptic oral rinse  7 mL Mouth Rinse q12n4p  . aspirin EC  81 mg Oral Daily  . atorvastatin  40 mg Oral q1800  . [START ON 10/19/2014]  ceFAZolin (ANCEF) IV  2 g Intravenous to XRAY  . chlorhexidine  15 mL Mouth Rinse BID  . citalopram  20 mg Oral Daily  . ferrous sulfate  325 mg Oral Q breakfast  .  furosemide  120 mg Intravenous BID  . gabapentin  300 mg Oral QHS  . hydrALAZINE  25 mg Oral TID  . insulin aspart  0-9 Units Subcutaneous TID WC  . insulin detemir  7 Units Subcutaneous QHS  . ipratropium-albuterol  3 mL Nebulization Q6H  . isosorbide mononitrate  45 mg Oral Daily  . lisinopril  20 mg Oral Daily  . multivitamin with minerals  1 tablet Oral Daily  . potassium chloride  10 mEq Oral BID  . sodium chloride  3 mL Intravenous Q12H   Continuous Infusions:    Marzetta Board, MD [facility] Pager [phone]. If 7 PM - 7 AM, please contact night-coverage at [email or web address], password [email or web address] 10/14/2014, 1:31 PM  LOS: 4 days

## 2014-10-14 NOTE — Progress Notes (Signed)
Daily Progress Note   Patient Name: [name] [name]       Date: 10/14/2014 DOB: 1945/01/12  Age: 69 y.o. MRN#: [id number] Attending Physician: Caren Griffins, MD Primary Care Physician: Glenda Chroman., MD Admit Date: 10/10/2014  Reason for Consultation/Follow-up: GOC  Subjective:     [name] appears in much better spirits today but she is also surrounded by her family. Her sons and daughter-in-laws are at bedside. I was able to share my conversation with her from yesterday and that she is struggling but not ready to give up although she fears (as do I) that she will continue to decline and unlikely to have significant improvement. They all understand that she is very ill and acknowledge the struggles she has had. They are extremely supportive to follow her wishes. We discussed her decision to continue attempts at treatment and physical therapy but also to keep the option of hospice open to her if this does not go well. Emotional support provided. They were concerned that she does not always continue her depression medication at home or take consistently - encouraged her to continue or consult her provider prior to discontinuing.    Length of Stay: 4 days  Current Medications: Scheduled Meds:  . amLODipine  10 mg Oral Daily  . antiseptic oral rinse  7 mL Mouth Rinse q12n4p  . aspirin EC  81 mg Oral Daily  . atorvastatin  40 mg Oral q1800  . chlorhexidine  15 mL Mouth Rinse BID  . citalopram  20 mg Oral Daily  . ferrous sulfate  325 mg Oral Q breakfast  . furosemide  120 mg Intravenous BID  . gabapentin  300 mg Oral QHS  . hydrALAZINE  25 mg Oral TID  . insulin aspart  0-9 Units Subcutaneous TID WC  . insulin detemir  7 Units Subcutaneous QHS  . ipratropium-albuterol  3 mL Nebulization Q6H  . isosorbide mononitrate  45 mg Oral Daily  . lisinopril  20 mg Oral Daily  . multivitamin with minerals  1 tablet Oral Daily  . potassium chloride  10 mEq Oral BID  . sodium chloride  3 mL  Intravenous Q12H    Continuous Infusions:    PRN Meds: sodium chloride, acetaminophen, HYDROcodone-acetaminophen, nitroGLYCERIN, ondansetron (ZOFRAN) IV, sodium chloride  Palliative Performance Scale: 30%     Vital Signs: BP 137/54 mmHg  Pulse 56  Temp(Src) 98.3 F (36.8 C) (Oral)  Resp 24  Ht _0  (1.676 m)  Wt 87.771 kg (193 lb 8 oz)  BMI 31.25 kg/m2  SpO2 90% SpO2: SpO2: 90 % O2 Device: O2 Device: Venturi Mask O2 Flow Rate: O2 Flow Rate (L/min): 15 L/min  Intake/output summary:  Intake/Output Summary (Last 24 hours) at 10/14/14 1043 Last data filed at 10/14/14 0805  Gross per 24 hour  Intake    189 ml  Output   2000 ml  Net  -1811 ml   LBM: Last BM Date: 10/13/14 Baseline Weight: Weight: 90.719 kg (200 lb) Most recent weight: Weight: 87.771 kg (193 lb 8 oz)  Physical Exam: General: NAD, appears less fatigued CVS: Bradycardic Resp: Face mask, less SOB with conversation Neuro: Awake, alert, oriented x 3   Additional Data Reviewed: Recent Labs     10/13/14  0440  10/14/14  0540  WBC  10.2  11.5*  HGB  9.0*  9.0*  PLT  202  235  NA  139  135  BUN  36*  32*  CREATININE  1.39*  1.26*     Problem List:  Patient Active Problem List   Diagnosis Date Noted  . Bradycardia, sinus 10/13/2014  . Palliative care encounter 10/13/2014  . Hypoglycemia 10/12/2014  . Anemia 10/12/2014  . Toe ulcer ([place]) 10/12/2014  . Unresponsive episode 10/12/2014  . Acute respiratory failure with hypoxia ([place])   . Pulmonary hypertension ([place])   . Chest pain   . Acute on chronic diastolic congestive heart failure ([place])   . Pleural effusion, right 09/04/2014  . Acute on chronic respiratory failure with hypoxia ([place]) 09/04/2014  . Pressure ulcer 09/04/2014  . CHF (congestive heart failure) ([place]) 09/04/2014  . Hypertensive urgency 09/03/2014  . PVD (peripheral vascular disease) ([place]) 11/10/2013  . Critical lower limb ischemia 10/06/2013  . Chronic combined systolic and  diastolic heart failure ([place]) 03/18/2013  . Diabetes mellitus type II, uncontrolled ([place]) 01/13/2011  . Hx of CABG 2012   . PAD (peripheral artery disease) ([place])   . Carotid artery disease ([place])   . Hypertension, uncontrolled 01/14/2010  . Mixed hyperlipidemia 11/11/2009  . TOBACCO ABUSE 11/11/2009     Palliative Care Assessment & Plan    Code Status:  DNR  Goals of Care:  Continue attempts at improvement with consideration of transition to comfort/hospice if this fails.   3. Symptom Management:  Leg Pain: Continue gabapentin and hydrocodone-acetaminophen prn.   Dyspnea: Continue oxygen and therapies per attending. Consider benefit of PleurX.   Prognosis: Likely very poor - < 6 months.   Discharge Planning: Return home with home health, PT with possible transition to hospice eventually.    Thank you for allowing the Palliative Medicine Team to assist in the care of this patient.   Time In: 1015 Time Out: 1045 Total Time 40mn Prolonged Time Billed  no     Greater than 50%  of this time was spent counseling and coordinating care related to the above assessment and plan.   AVinie Sill NP Palliative Medicine Team Pager # [phone](M-F 8a-5p) Team Phone # [phone](Nights/Weekends)  10/14/2014, 10:43 AM

## 2014-10-14 NOTE — Progress Notes (Signed)
According to notes patient has not worn BIPAP since 10/12/2014. Patient is currently on 55% venturi mask at 15lpm. Patient sats are 91%. RT will leave BIPAP in room in case its needed through the night.

## 2014-10-15 ENCOUNTER — Inpatient Hospital Stay (HOSPITAL_COMMUNITY): Payer: Medicare Other

## 2014-10-15 DIAGNOSIS — J90 Pleural effusion, not elsewhere classified: Secondary | ICD-10-CM

## 2014-10-15 LAB — BASIC METABOLIC PANEL
Anion gap: 8 (ref 5–15)
BUN: 33 mg/dL — AB (ref 6–20)
CHLORIDE: 92 mmol/L — AB (ref 101–111)
CO2: 36 mmol/L — AB (ref 22–32)
Calcium: 8.6 mg/dL — ABNORMAL LOW (ref 8.9–10.3)
Creatinine, Ser: 1.2 mg/dL — ABNORMAL HIGH (ref 0.44–1.00)
GFR calc Af Amer: 53 mL/min — ABNORMAL LOW (ref >60)
GFR calc non Af Amer: 46 mL/min — ABNORMAL LOW (ref >60)
GLUCOSE: 130 mg/dL — AB (ref 65–99)
POTASSIUM: 4.5 mmol/L (ref 3.5–5.1)
Sodium: 136 mmol/L (ref 135–145)

## 2014-10-15 LAB — GLUCOSE, CAPILLARY
GLUCOSE-CAPILLARY: 118 mg/dL — AB (ref 65–99)
Glucose-Capillary: 175 mg/dL — ABNORMAL HIGH (ref 65–99)
Glucose-Capillary: 99 mg/dL (ref 65–99)
Glucose-Capillary: 151 mg/dL — ABNORMAL HIGH (ref 65–99)

## 2014-10-15 LAB — CULTURE, BLOOD (ROUTINE X 2)
CULTURE: NO GROWTH
Culture: NO GROWTH

## 2014-10-15 LAB — CBC
HEMATOCRIT: 29.9 % — AB (ref 36.0–46.0)
Hemoglobin: 9.4 g/dL — ABNORMAL LOW (ref 12.0–15.0)
MCH: 28.3 pg (ref 26.0–34.0)
MCHC: 31.4 g/dL (ref 30.0–36.0)
MCV: 90.1 fL (ref 78.0–100.0)
PLATELETS: 267 10*3/uL (ref 150–400)
RBC: 3.32 MIL/uL — AB (ref 3.87–5.11)
RDW: 15 % (ref 11.5–15.5)
WBC: 13.5 10*3/uL — ABNORMAL HIGH (ref 4.0–10.5)

## 2014-10-15 LAB — APTT: APTT: 38 s — AB (ref 24–37)

## 2014-10-15 LAB — PROTIME-INR
INR: 1.35 (ref 0.00–1.49)
Prothrombin Time: 16.7 seconds — ABNORMAL HIGH (ref 11.6–15.2)

## 2014-10-15 MED ORDER — MIDAZOLAM HCL 2 MG/2ML IJ SOLN
INTRAMUSCULAR | Status: AC | PRN
  Administered 2014-10-15 (×2): 0.5 mg via INTRAVENOUS

## 2014-10-15 MED ORDER — FENTANYL CITRATE (PF) 100 MCG/2ML IJ SOLN
INTRAMUSCULAR | Status: AC | PRN
  Administered 2014-10-15: 25 ug via INTRAVENOUS

## 2014-10-15 MED ORDER — LIDOCAINE-EPINEPHRINE (PF) 1 %-1:200000 IJ SOLN
INTRAMUSCULAR | Status: AC
  Filled 2014-10-15: qty 30

## 2014-10-15 MED ORDER — LIDOCAINE HCL 1 % IJ SOLN
INTRAMUSCULAR | Status: AC
  Filled 2014-10-15: qty 20

## 2014-10-15 MED ORDER — CEFAZOLIN SODIUM-DEXTROSE 2-3 GM-% IV SOLR
INTRAVENOUS | Status: AC
  Administered 2014-10-15: 2 g via INTRAVENOUS
  Filled 2014-10-15: qty 50

## 2014-10-15 MED ORDER — FENTANYL CITRATE (PF) 100 MCG/2ML IJ SOLN
INTRAMUSCULAR | Status: AC
  Filled 2014-10-15: qty 2

## 2014-10-15 MED ORDER — MIDAZOLAM HCL 2 MG/2ML IJ SOLN
INTRAMUSCULAR | Status: AC
  Filled 2014-10-15: qty 2

## 2014-10-15 NOTE — Sedation Documentation (Signed)
Pt brought ot room on 55% venti mask with Sa02 low 80's. Changed to non-rebreather mask with Sa02 92%. Pt denies any distress.

## 2014-10-15 NOTE — Progress Notes (Signed)
Patient was given morning medications this morning and vomited almost 5 minutes later. Unsure on how much of medication was absorbed. Zofran given.

## 2014-10-15 NOTE — Sedation Documentation (Signed)
Patient is resting comfortably. 

## 2014-10-15 NOTE — Progress Notes (Signed)
Patient ID: [name] [name], female   DOB: April 30, 1945, 69 y.o.   MRN: [id number]    Subjective:  Some nausea this am vohmit x1   Objective:  Vital Signs in the last 24 hours: Temp:  [97.6 F (36.4 C)-98.4 F (36.9 C)] 98.4 F (36.9 C) (10/06 0447) Pulse Rate:  [53-62] 62 (10/06 0728) Resp:  [13-20] 18 (10/06 0728) BP: (144-167)/(39-51) 167/44 mmHg (10/06 0728) SpO2:  [86 %-92 %] 89 % (10/06 0728) FiO2 (%):  [55 %] 55 % (10/06 0543) Weight:  [86.909 kg (191 lb 9.6 oz)] 86.909 kg (191 lb 9.6 oz) (10/06 0447)  Intake/Output from previous day:  Intake/Output Summary (Last 24 hours) at 10/15/14 0925 Last data filed at 10/15/14 0447  Gross per 24 hour  Intake    127 ml  Output   2600 ml  Net  -2473 ml    Physical Exam: Affect appropriate Chronically ill white female with oxygen mask on  HEENT: normal Neck supple with no adenopathy JVP normal no bruits no thyromegaly Lungs rhonchi no wheezing decreased BS right base  Heart:  S1/S2 no murmur, no rub, gallop or click PMI normal Abdomen: benighn, BS positve, no tenderness, no AAA no bruit.  No HSM or HJR Distal pulses intact with no bruits No edema Neuro non-focal Skin warm and dry No muscular weakness    Rate: 57  Rhythm:  NSR  10/15/2014   Lab Results:  Recent Labs  10/14/14 0540 10/15/14 0408  WBC 11.5* 13.5*  HGB 9.0* 9.4*  PLT 235 267    Recent Labs  10/14/14 0540 10/15/14 0408  NA 135 136  K 4.4 4.5  CL 92* 92*  CO2 34* 36*  GLUCOSE 97 130*  BUN 32* 33*  CREATININE 1.26* 1.20*    Recent Labs  10/12/14 2138 10/13/14 0440  TROPONINI 0.08* 0.04*    Recent Labs  10/15/14 0408  INR 1.35    Scheduled Meds: . amLODipine  10 mg Oral Daily  . antiseptic oral rinse  7 mL Mouth Rinse q12n4p  . aspirin EC  81 mg Oral Daily  . atorvastatin  40 mg Oral q1800  . [START ON 10/19/2014]  ceFAZolin (ANCEF) IV  2 g Intravenous to XRAY  . chlorhexidine  15 mL Mouth Rinse BID  . citalopram  20 mg Oral  Daily  . ferrous sulfate  325 mg Oral Q breakfast  . furosemide  120 mg Intravenous BID  . gabapentin  300 mg Oral QHS  . heparin subcutaneous  5,000 Units Subcutaneous 3 times per day  . hydrALAZINE  25 mg Oral TID  . insulin aspart  0-9 Units Subcutaneous TID WC  . insulin detemir  7 Units Subcutaneous QHS  . ipratropium-albuterol  3 mL Nebulization Q6H  . isosorbide mononitrate  45 mg Oral Daily  . lisinopril  20 mg Oral Daily  . multivitamin with minerals  1 tablet Oral Daily  . potassium chloride  10 mEq Oral BID  . sodium chloride  3 mL Intravenous Q12H   Continuous Infusions:  PRN Meds:.sodium chloride, acetaminophen, HYDROcodone-acetaminophen, nitroGLYCERIN, ondansetron (ZOFRAN) IV, sodium chloride   Imaging:  CXR 10/12/14 COMPARISON: 10/11/2014 and previous  FINDINGS: Previous median sternotomy and CABG. Persistent cardiomegaly. Venous hypertension with interstitial and alveolar edema. Residual pleural fluid on the right with associated volume loss in the right lung. No pneumothorax. Possible slight reaccumulation of the right effusions since yesterday.  IMPRESSION: Persistent evidence of congestive heart failure with interstitial and alveolar edema.  Right effusion, previously tapped. Possible slight reaccumulation of the right effusion. Associated atelectasis in the right lung secondary to the fusion.   Cardiac Studies: Echo 09/09/14 Study Conclusions  - Left ventricle: The cavity size was normal. Wall thickness was normal. Systolic function was normal. The estimated ejection fraction was in the range of 50% to 55%. Wall motion was normal; there were no regional wall motion abnormalities. - Left atrium: The atrium was mildly dilated.   Assessment/Plan:  69 y/o woman with pmh htn, hlp, DM2, CAD s/p CABG 8003, chronic diastolic CHF EF 49% , pulmonary hypertension. She follows up with Dr. Domenic Polite and was recently admitted for with acute on chronic  respiratory failure complicated by diastolic heart failure and volume overload. Patient diuresed 5 pounds and also underwent ultrasound-guided thoracentesis. 10/3 PM she apparently had respiratory failure with bradycardia down to 36. On exam her Rt effusion has recurred. Last PM she had runs of NSVT.  Principal Problem:   Acute respiratory failure with hypoxia (HCC) Active Problems:   Hypertension, uncontrolled   PAD (peripheral artery disease) ([place])   Hx of CABG 2012   Diabetes mellitus type II, uncontrolled (HCC)   Pleural effusion, right   Acute on chronic diastolic congestive heart failure (HCC)   Anemia   Toe ulcer (HCC)   Unresponsive episode   Bradycardia, sinus   Palliative care encounter   Pleural effusion   Recurrent pleural effusion on right    Depressed:  Barely been OOB.  R basilar effusion.  Will give extra dose of lasix.  P2Y is 249 in non responder range.  Discussed with Dr Pascal Lux Who will try to place pleur X this afternoon.  Make NPO   Jenkins Rouge

## 2014-10-15 NOTE — Progress Notes (Signed)
PT Cancellation Note  Patient Details Name: [name] [name] MRN: [id number] DOB: 04-10-1945   Cancelled Treatment:    Reason Eval/Treat Not Completed: Medical issues which prohibited therapy (SaO2 84% at rest on ventimask, pt with ongoing cough and feeling exhausted. Will follow. )   Philomena Doheny 10/15/2014, 9:56 AM [phone]

## 2014-10-15 NOTE — Progress Notes (Signed)
PROGRESS NOTE  [name] [name] LDJ:[id number] DOB: [birth year] DOA: 10/10/2014 PCP: Glenda Chroman., MD   HPI: 69 yo female with chronic hypoxic respiratory failure, diastolic CHF, right pleural effusion tapped a month ago, PAD with chronic leg pain, DM admitted with acute respiratory failure secondary to pleural effusion and acute on chronic diastolic heart failure. Required BiPAP initially. Had thoracentesis yielding 1.4 L of transudate. Has been able to come off BiPAP, but had an episode of near arrest on 10/3 at which time patient was unresponsive, had a respiratory rate of about 5, hypoxic into the 60s on supplemental oxygen, and bradycardic into the 30s. She was placed on nonrebreather mask and gradually came around. All of her sedating medications have been held. Coreg held. Lasix increased to due to poor response to 80 mg IV twice a day. Also, has had problems with hypoglycemia, and insulin has been scaled back.  Subjective / 24 H Interval events - endorses shortness of breath   Assessment/Plan: Principal Problem:   Acute respiratory failure with hypoxia (HCC) Active Problems:   Hypertension, uncontrolled   PAD (peripheral artery disease) ([place])   Hx of CABG 2012   Diabetes mellitus type II, uncontrolled (HCC)   Pleural effusion, right   Acute on chronic diastolic congestive heart failure (HCC)   Anemia   Toe ulcer (HCC)   Unresponsive episode   Bradycardia, sinus   Palliative care encounter   Pleural effusion   Recurrent pleural effusion on right   Unresponsive episode with hypoxia, bradycardia and bradypnea.  - Coreg has been held. Heart rate in the 40s and 50s.  - Oxygen saturations better however still requiring venturi mask with 55% FIO2. Chest x-ray essentially the same, possibly slight progression of pleural effusion. ABG on 10/3 with mild CO2 retention as well as increased a-a gradient but without acidosis.  - Troponins flat. EKG without ischemic changes. H&H stable.  -  All sedating medications have been held,  - chronic leg pain seems improved today - According to the chaplain's note, she wanted to "give up permanently". Palliative following.   Acute on chronic respiratory failure with hypoxia  - persistently on Ventimask, wean off as tolerated - continue diuresis as below  - IR to place drain hopefully prior to weekend, she appears   Acute on chronic diastolic congestive heart failure with pulmonary hypertension - Lasix was increased on 10/3 to 120 mg BID - weight down 9 lbs, neg negative 8.4 L; renal function holding  Pleural effusion, right, recurrent - s/p thoracentesis. - recurrent issue, Pleurex pending per IR hopefully today, she is a non responder to Plavix based on P2Y12 assay - no malignancy seen on cytology   Hypertension, controlled - Coreg held due to severe bradycardia  PAD (peripheral artery disease)  - patient reports Dr. Gwenlyn Found to do a procedure on both legs at some point in the future.  - Has necrotic eschar on right second toe.Wound consult recommends painting with Betadine only.   Patient complaining of chronic leg pain - PAD? Neuropathy? see above. Will resume hydrocodone as needed. Resume gabapentin daily at bedtime. Watch for sedation.   Diabetes mellitus type II, with hypoglycemia - patient with intermittent hypoglycemia, Levemir adjusted, now on 7 U, no further hypoglycemia today    Anemia - no reported bleeding. Acute on chronic. anemia panel consistent with chronic disease and a component of iron deficiency. Will add iron supplementation. Hemoccults ordered. Hemoglobin has actually improved with diuresis.   Long-term prognosis appears quite  guarded.    Diet: Diet NPO time specified Except for: Sips with Meds Diet NPO time specified Except for: Sips with Meds Fluids: none  DVT Prophylaxis: heparin  Code Status: DNR Family Communication: no family bedside  Disposition Plan: TBD  Consultants:  Cardiology    IR  Procedures:  None    Antibiotics  Anti-infectives    Start     Dose/Rate Route Frequency Ordered Stop   10/19/14 0800  ceFAZolin (ANCEF) IVPB 2 g/50 mL premix     2 g 100 mL/hr over 30 Minutes Intravenous To Radiology 10/14/14 1145 10/20/14 0800   10/19/14 0000  ceFAZolin (ANCEF) powder 2 g  Status:  Discontinued    Comments:  Give in IR for surgical prophylaxis   2 g Other To Surgery 10/14/14 1132 10/14/14 1144       Studies  No results found.  Objective  Filed Vitals:   10/15/14 0543 10/15/14 0728 10/15/14 0900 10/15/14 1128  BP:  167/44  141/45  Pulse: 59 62  55  Temp:   98.7 F (37.1 C) 98.9 F (37.2 C)  TempSrc:   Oral Oral  Resp: _0 Height:      Weight:      SpO2: 91% 89%  89%    Intake/Output Summary (Last 24 hours) at 10/15/14 1309 Last data filed at 10/15/14 1200  Gross per 24 hour  Intake    367 ml  Output   3500 ml  Net  -3133 ml   Filed Weights   10/13/14 0400 10/14/14 0327 10/15/14 0447  Weight: 88.27 kg (194 lb 9.6 oz) 87.771 kg (193 lb 8 oz) 86.909 kg (191 lb 9.6 oz)    Exam:  GENERAL: in mild distress, tachypneic  HEENT: head NCAT, no scleral icterus. Pupils round and reactive.   NECK: Supple.   LUNGS:  No wheezing or crackles, overall poor air movement  HEART: Regular rate and rhythm without murmur. 2+ pulses, no JVD, no peripheral edema  ABDOMEN: Soft, nontender, and nondistended. Positive bowel sounds.   EXTREMITIES: Without any cyanosis, clubbing, rash, lesions or edema.  NEUROLOGIC: non focal    Data Reviewed: Basic Metabolic Panel:  Recent Labs Lab 10/10/14 2153 10/11/14 0527 10/12/14 0444 10/13/14 0440 10/14/14 0540 10/15/14 0408  NA 134*  --  139 139 135 136  K 4.2  --  3.7 4.5 4.4 4.5  CL 97*  --  97* 98* 92* 92*  CO2 29  --  34* 35* 34* 36*  GLUCOSE 256*  --  84 59* 97 130*  BUN 35*  --  30* 36* 32* 33*  CREATININE 1.34* 1.28* 1.13* 1.39* 1.26* 1.20*  CALCIUM 8.5*  --  8.7* 8.7* 8.5*  8.6*  MG  --   --  2.1  --   --   --    Liver Function Tests:  Recent Labs Lab 10/12/14 0444  AST 24  ALT 23  ALKPHOS 69  BILITOT 0.7  PROT 6.4*  ALBUMIN 2.4*   CBC:  Recent Labs Lab 10/10/14 2153 10/11/14 0527 10/12/14 0444 10/12/14 1709 10/13/14 0440 10/14/14 0540 10/15/14 0408  WBC 11.2* 9.7 9.0  --  10.2 11.5* 13.5*  NEUTROABS 9.8*  --   --   --   --   --   --   HGB 9.3* 8.9* 8.6* 8.3* 9.0* 9.0* 9.4*  HCT 29.2* 28.3* 28.2* 27.4* 28.5* 28.9* 29.9*  MCV 89.8 89.3 89.5  --  89.6 89.8 90.1  PLT 190 175 190  --  202 235 267   Cardiac Enzymes:  Recent Labs Lab 10/11/14 1021 10/11/14 1555 10/12/14 1709 10/12/14 2138 10/13/14 0440  TROPONINI 0.06* 0.10* 0.04* 0.08* 0.04*   BNP (last 3 results)  Recent Labs  09/09/14 0245 10/10/14 2153  BNP 436.8* 914.0*   CBG:  Recent Labs Lab 10/14/14 1135 10/14/14 1639 10/14/14 2113 10/15/14 0751 10/15/14 1127  GLUCAP 89 194* 204* 118* 175*    Recent Results (from the past 240 hour(s))  Blood culture (routine x 2)     Status: None   Collection Time: 10/10/14  9:53 PM  Result Value Ref Range Status   Specimen Description BLOOD RIGHT ANTECUBITAL  Final   Special Requests BOTTLES DRAWN AEROBIC AND ANAEROBIC 6CC  Final   Culture NO GROWTH 5 DAYS  Final   Report Status 10/15/2014 FINAL  Final  Blood culture (routine x 2)     Status: None   Collection Time: 10/10/14 10:00 PM  Result Value Ref Range Status   Specimen Description BLOOD RIGHT ARM  Final   Special Requests BOTTLES DRAWN AEROBIC ONLY 6CC  Final   Culture NO GROWTH 5 DAYS  Final   Report Status 10/15/2014 FINAL  Final  MRSA PCR Screening     Status: None   Collection Time: 10/11/14  4:16 AM  Result Value Ref Range Status   MRSA by PCR NEGATIVE NEGATIVE Final    Comment:        The GeneXpert MRSA Assay (FDA approved for NASAL specimens only), is one component of a comprehensive MRSA colonization surveillance program. It is not intended to  diagnose MRSA infection nor to guide or monitor treatment for MRSA infections.   Culture, body fluid-bottle     Status: None (Preliminary result)   Collection Time: 10/11/14  9:57 AM  Result Value Ref Range Status   Specimen Description FLUID RIGHT PLEURAL  Final   Special Requests BAA 10CCS  Final   Culture NO GROWTH 4 DAYS  Final   Report Status PENDING  Incomplete  Gram stain     Status: None   Collection Time: 10/11/14  9:57 AM  Result Value Ref Range Status   Specimen Description FLUID RIGHT PLEURAL  Final   Special Requests NONE  Final   Gram Stain   Final    MODERATE WBC PRESENT, PREDOMINANTLY PMN NO ORGANISMS SEEN    Report Status 10/11/2014 FINAL  Final     Scheduled Meds: . amLODipine  10 mg Oral Daily  . antiseptic oral rinse  7 mL Mouth Rinse q12n4p  . aspirin EC  81 mg Oral Daily  . atorvastatin  40 mg Oral q1800  . [START ON 10/19/2014]  ceFAZolin (ANCEF) IV  2 g Intravenous to XRAY  . chlorhexidine  15 mL Mouth Rinse BID  . citalopram  20 mg Oral Daily  . ferrous sulfate  325 mg Oral Q breakfast  . furosemide  120 mg Intravenous BID  . gabapentin  300 mg Oral QHS  . heparin subcutaneous  5,000 Units Subcutaneous 3 times per day  . hydrALAZINE  25 mg Oral TID  . insulin aspart  0-9 Units Subcutaneous TID WC  . insulin detemir  7 Units Subcutaneous QHS  . ipratropium-albuterol  3 mL Nebulization Q6H  . isosorbide mononitrate  45 mg Oral Daily  . lisinopril  20 mg Oral Daily  . multivitamin with minerals  1 tablet Oral Daily  . potassium chloride  10 mEq  Oral BID  . sodium chloride  3 mL Intravenous Q12H   Continuous Infusions:    Marzetta Board, MD [facility] Pager [phone]. If 7 PM - 7 AM, please contact night-coverage at [email or web address], password [facility] 10/15/2014, 1:09 PM  LOS: 5 days

## 2014-10-15 NOTE — Procedures (Signed)
Successful placement of a right sided pleural drainage catheter. Approximately 1.8 L of serous pleural fluid aspirated after catheter placement. No immediate complications.  Ronny Bacon, MD Pager #: [phone]

## 2014-10-15 NOTE — Sedation Documentation (Signed)
Right sided pleural tube connected to suction with large amount light brown colored fluid present.

## 2014-10-16 ENCOUNTER — Institutional Professional Consult (permissible substitution): Payer: Medicare Other | Admitting: Pulmonary Disease

## 2014-10-16 ENCOUNTER — Telehealth: Payer: Self-pay | Admitting: Pulmonary Disease

## 2014-10-16 ENCOUNTER — Inpatient Hospital Stay (HOSPITAL_COMMUNITY): Payer: Medicare Other

## 2014-10-16 DIAGNOSIS — I10 Essential (primary) hypertension: Secondary | ICD-10-CM

## 2014-10-16 LAB — CULTURE, BODY FLUID-BOTTLE: CULTURE: NO GROWTH

## 2014-10-16 LAB — BASIC METABOLIC PANEL
ANION GAP: 9 (ref 5–15)
BUN: 31 mg/dL — ABNORMAL HIGH (ref 6–20)
CHLORIDE: 89 mmol/L — AB (ref 101–111)
CO2: 37 mmol/L — AB (ref 22–32)
CREATININE: 1.16 mg/dL — AB (ref 0.44–1.00)
Calcium: 8.4 mg/dL — ABNORMAL LOW (ref 8.9–10.3)
GFR calc non Af Amer: 48 mL/min — ABNORMAL LOW (ref >60)
GFR, EST AFRICAN AMERICAN: 55 mL/min — AB (ref >60)
GLUCOSE: 111 mg/dL — AB (ref 65–99)
Potassium: 4.3 mmol/L (ref 3.5–5.1)
Sodium: 135 mmol/L (ref 135–145)

## 2014-10-16 LAB — CULTURE, BODY FLUID W GRAM STAIN -BOTTLE

## 2014-10-16 LAB — CBC
HEMATOCRIT: 28.7 % — AB (ref 36.0–46.0)
HEMOGLOBIN: 9 g/dL — AB (ref 12.0–15.0)
MCH: 28 pg (ref 26.0–34.0)
MCHC: 31.4 g/dL (ref 30.0–36.0)
MCV: 89.4 fL (ref 78.0–100.0)
Platelets: 269 10*3/uL (ref 150–400)
RBC: 3.21 MIL/uL — ABNORMAL LOW (ref 3.87–5.11)
RDW: 15 % (ref 11.5–15.5)
WBC: 9.9 10*3/uL (ref 4.0–10.5)

## 2014-10-16 LAB — GLUCOSE, CAPILLARY
GLUCOSE-CAPILLARY: 184 mg/dL — AB (ref 65–99)
GLUCOSE-CAPILLARY: 258 mg/dL — AB (ref 65–99)
Glucose-Capillary: 89 mg/dL (ref 65–99)
Glucose-Capillary: 296 mg/dL — ABNORMAL HIGH (ref 65–99)

## 2014-10-16 NOTE — Telephone Encounter (Signed)
Note entered in error as patient is still hospitalized and likely will not be coming to her office appointment this afternoon. Please reschedule if appropriate.  Sonia Baller Ashok Cordia, M.D. [facility] Pager:  [phone] After 3pm or if no response, call [phone]

## 2014-10-16 NOTE — Clinical Social Work Note (Signed)
CSW notes that patient's disposition has changed to SNF. CSW unable to see patient today, report has been left for weekend CSW.  Liz Beach MSW, [place], [place], [phone]

## 2014-10-16 NOTE — Progress Notes (Addendum)
Daily Progress Note   Patient Name: [name] [name]       Date: 10/16/2014 DOB: October 15, 1945  Age: 69 y.o. MRN#: [id number] Attending Physician: Caren Griffins, MD Primary Care Physician: Glenda Chroman., MD Admit Date: 10/10/2014  Reason for Consultation/Follow-up: GOC  Subjective:     [name] [name] is exhausted and requests to get back in bed from chair, she is finishing neb treatment. She says she feels "okay" today but is still tired. She still has serious doubts this will get much better for her but still desires to continue treatment and physical therapy. We again discussed the alternative and hospice option and I told her that she just needs to let us know if this is her preference her family seems supportive and we will help support her as well. When I talked about her family her face lit up with smiles and she seemed lifted up. We will continue to follow. Please call [phone] for acute palliative needs over the weekend or with assistance if she decides to focus more on comfort.    Length of Stay: 6 days  Current Medications: Scheduled Meds:  . amLODipine  10 mg Oral Daily  . antiseptic oral rinse  7 mL Mouth Rinse q12n4p  . aspirin EC  81 mg Oral Daily  . atorvastatin  40 mg Oral q1800  . chlorhexidine  15 mL Mouth Rinse BID  . citalopram  20 mg Oral Daily  . ferrous sulfate  325 mg Oral Q breakfast  . furosemide  120 mg Intravenous BID  . gabapentin  300 mg Oral QHS  . heparin subcutaneous  5,000 Units Subcutaneous 3 times per day  . hydrALAZINE  25 mg Oral TID  . insulin aspart  0-9 Units Subcutaneous TID WC  . insulin detemir  7 Units Subcutaneous QHS  . ipratropium-albuterol  3 mL Nebulization Q6H  . isosorbide mononitrate  45 mg Oral Daily  . lisinopril  20 mg Oral Daily  . multivitamin with minerals  1 tablet Oral Daily  . potassium chloride  10 mEq Oral BID  . sodium chloride  3 mL Intravenous Q12H    Continuous Infusions:    PRN Meds: sodium chloride,  acetaminophen, HYDROcodone-acetaminophen, nitroGLYCERIN, ondansetron (ZOFRAN) IV, sodium chloride  Palliative Performance Scale: 30%     Vital Signs: BP 131/36 mmHg  Pulse 55  Temp(Src) 99 F (37.2 C) (Oral)  Resp 17  Ht 5' 6" (1.676 m)  Wt 78.2 kg (172 lb 6.4 oz)  BMI 27.84 kg/m2  SpO2 96% SpO2: SpO2: 96 % O2 Device: O2 Device: Nasal Cannula O2 Flow Rate: O2 Flow Rate (L/min): 4 L/min  Intake/output summary:   Intake/Output Summary (Last 24 hours) at 10/16/14 1808 Last data filed at 10/16/14 1118  Gross per 24 hour  Intake    127 ml  Output   2300 ml  Net  -2173 ml   LBM: Last BM Date: 10/12/14 Baseline Weight: Weight: 90.719 kg (200 lb) Most recent weight: Weight: 78.2 kg (172 lb 6.4 oz)  Physical Exam: General: NAD, appears fatigued Resp: Now on nasal cannula, less SOB with conversation Neuro: Awake, alert, oriented x 3   Additional Data Reviewed: Recent Labs     10/15/14  0408  10/16/14  0328  WBC  13.5*  9.9  HGB  9.4*  9.0*  PLT  267  269  NA  136  135  BUN  33*  31*  CREATININE  1.20*  1.16*  Problem List:  Patient Active Problem List   Diagnosis Date Noted  . Pleural effusion   . Recurrent pleural effusion on right   . Bradycardia, sinus 10/13/2014  . Palliative care encounter 10/13/2014  . Hypoglycemia 10/12/2014  . Anemia 10/12/2014  . Toe ulcer ([place]) 10/12/2014  . Unresponsive episode 10/12/2014  . Acute respiratory failure with hypoxia ([place])   . Pulmonary hypertension ([place])   . Chest pain   . Acute on chronic diastolic congestive heart failure ([place])   . Pleural effusion, right 09/04/2014  . Acute on chronic respiratory failure with hypoxia ([place]) 09/04/2014  . Pressure ulcer 09/04/2014  . CHF (congestive heart failure) ([place]) 09/04/2014  . Hypertensive urgency 09/03/2014  . PVD (peripheral vascular disease) ([place]) 11/10/2013  . Critical lower limb ischemia 10/06/2013  . Chronic combined systolic and diastolic heart failure ([place])  03/18/2013  . Diabetes mellitus type II, uncontrolled ([place]) 01/13/2011  . Hx of CABG 2012   . PAD (peripheral artery disease) ([place])   . Carotid artery disease ([place])   . Hypertension, uncontrolled 01/14/2010  . Mixed hyperlipidemia 11/11/2009  . TOBACCO ABUSE 11/11/2009     Palliative Care Assessment & Plan    Code Status:  DNR  Goals of Care:  Continue attempts at improvement with consideration of transition to comfort/hospice if this fails.   3. Symptom Management:  Leg Pain: Continue gabapentin and hydrocodone-acetaminophen prn.   Dyspnea: Continue oxygen and therapies per attending. PleurX placed yesterday.   Prognosis: Likely very poor - < 6 months.   Discharge Planning: Return home with home health, PT with possible transition to hospice eventually.    Thank you for allowing the Palliative Medicine Team to assist in the care of this patient.   Time In: 1400 Time Out: 1420 Total Time 68mn Prolonged Time Billed  no     Greater than 50%  of this time was spent counseling and coordinating care related to the above assessment and plan.   AVinie Sill NP Palliative Medicine Team Pager # [phone](M-F 8a-5p) Team Phone # [phone](Nights/Weekends)  10/16/2014, 6:08 PM

## 2014-10-16 NOTE — Progress Notes (Signed)
Patient ID: [name] [name], female   DOB: 1945-04-21, 69 y.o.   MRN: [id number]    Subjective:  She seems comfortable eating breakfast not needing oxygen  Objective:  Vital Signs in the last 24 hours: Temp:  [98.5 F (36.9 C)-98.9 F (37.2 C)] 98.5 F (36.9 C) (10/07 0733) Pulse Rate:  [50-67] 55 (10/07 0733) Resp:  [12-24] 18 (10/07 0733) BP: (137-180)/(38-78) 156/50 mmHg (10/07 0733) SpO2:  [86 %-99 %] 94 % (10/07 0816) FiO2 (%):  [55 %] 55 % (10/07 0816) Weight:  [78.2 kg (172 lb 6.4 oz)] 78.2 kg (172 lb 6.4 oz) (10/07 0215)  Intake/Output from previous day:  Intake/Output Summary (Last 24 hours) at 10/16/14 0854 Last data filed at 10/16/14 0549  Gross per 24 hour  Intake    127 ml  Output   5000 ml  Net  -4873 ml    Physical Exam: General appearance: alert, cooperative and no distress Neck: no JVD Lungs: dereased Rt 1/3 Heart: regular rate and rhythm Extremities: chronic skin changes, no significant edema Skin: pale, cool, dry Neurologic: Grossly normal  Pleur X catheter right side    Rate: 57  Rhythm: normal sinus rhythm, sinus bradycardia and 7 bts NSVT followed by a sinus beat, then 4 beats NSVT last pm.   Lab Results:  Recent Labs  10/15/14 0408 10/16/14 0328  WBC 13.5* 9.9  HGB 9.4* 9.0*  PLT 267 269    Recent Labs  10/15/14 0408 10/16/14 0328  NA 136 135  K 4.5 4.3  CL 92* 89*  CO2 36* 37*  GLUCOSE 130* 111*  BUN 33* 31*  CREATININE 1.20* 1.16*     Recent Labs  10/15/14 0408  INR 1.35    Scheduled Meds: . amLODipine  10 mg Oral Daily  . antiseptic oral rinse  7 mL Mouth Rinse q12n4p  . aspirin EC  81 mg Oral Daily  . atorvastatin  40 mg Oral q1800  . chlorhexidine  15 mL Mouth Rinse BID  . citalopram  20 mg Oral Daily  . ferrous sulfate  325 mg Oral Q breakfast  . furosemide  120 mg Intravenous BID  . gabapentin  300 mg Oral QHS  . heparin subcutaneous  5,000 Units Subcutaneous 3 times per day  . hydrALAZINE  25 mg Oral TID    . insulin aspart  0-9 Units Subcutaneous TID WC  . insulin detemir  7 Units Subcutaneous QHS  . ipratropium-albuterol  3 mL Nebulization Q6H  . isosorbide mononitrate  45 mg Oral Daily  . lisinopril  20 mg Oral Daily  . multivitamin with minerals  1 tablet Oral Daily  . potassium chloride  10 mEq Oral BID  . sodium chloride  3 mL Intravenous Q12H   Continuous Infusions:  PRN Meds:.sodium chloride, acetaminophen, HYDROcodone-acetaminophen, nitroGLYCERIN, ondansetron (ZOFRAN) IV, sodium chloride   Imaging:  CXR 10/12/14 COMPARISON: 10/11/2014 and previous  FINDINGS: Previous median sternotomy and CABG. Persistent cardiomegaly. Venous hypertension with interstitial and alveolar edema. Residual pleural fluid on the right with associated volume loss in the right lung. No pneumothorax. Possible slight reaccumulation of the right effusions since yesterday.  IMPRESSION: Persistent evidence of congestive heart failure with interstitial and alveolar edema. Right effusion, previously tapped. Possible slight reaccumulation of the right effusion. Associated atelectasis in the right lung secondary to the fusion.   Cardiac Studies: Echo 09/09/14 Study Conclusions  - Left ventricle: The cavity size was normal. Wall thickness was normal. Systolic function was normal. The estimated  ejection fraction was in the range of 50% to 55%. Wall motion was normal; there were no regional wall motion abnormalities. - Left atrium: The atrium was mildly dilated.   Assessment/Plan:  69 y/o woman with pmh htn, hlp, DM2, CAD s/p CABG 2979, chronic diastolic CHF EF 89% , pulmonary hypertension. She follows up with Dr. Domenic Polite and was recently admitted for with acute on chronic respiratory failure complicated by diastolic heart failure and volume overload. Patient diuresed 5 pounds and also underwent ultrasound-guided thoracentesis. 10/3 PM she apparently had respiratory failure with bradycardia down  to 36. On exam her Rt effusion has recurred. Last PM she had runs of NSVT.  Principal Problem:   Acute respiratory failure with hypoxia (HCC) Active Problems:   Hypertension, uncontrolled   PAD (peripheral artery disease) ([place])   Hx of CABG 2012   Diabetes mellitus type II, uncontrolled (HCC)   Pleural effusion, right   Acute on chronic diastolic congestive heart failure (HCC)   Anemia   Toe ulcer (HCC)   Unresponsive episode   Bradycardia, sinus   Palliative care encounter   Pleural effusion   Recurrent pleural effusion on right   PLAN:  Diastolic dysfunction stable on current diuretic Recurrent pleural effusion s/p thoracentesis x 2 and Pleur X catheter yesterday  Resume Plavix Will need d/c to rehab likely.  Outpatient f/u [facility].  Dr Willey Blade will care for catheter Previous CABG stable with no angina on nitrates ASA/Plavix  Will sign off   Jenkins Rouge

## 2014-10-16 NOTE — Progress Notes (Signed)
PROGRESS NOTE  [name] [name] WNJ:[id number] DOB: 03-20-1945 DOA: 10/10/2014 PCP: Glenda Chroman., MD   HPI: 69 yo female with chronic hypoxic respiratory failure, diastolic CHF, right pleural effusion tapped a month ago, PAD with chronic leg pain, DM admitted with acute respiratory failure secondary to pleural effusion and acute on chronic diastolic heart failure. Required BiPAP initially. Had thoracentesis yielding 1.4 L of transudate. Has been able to come off BiPAP, but had an episode of near arrest on 10/3 at which time patient was unresponsive, had a respiratory rate of about 5, hypoxic into the 60s on supplemental oxygen, and bradycardic into the 30s. She was placed on nonrebreather mask and gradually came around. All of her sedating medications have been held. Coreg held. Lasix increased to due to poor response to 80 mg IV twice a day. Also, has had problems with hypoglycemia, and insulin has been scaled back.  Subjective / 24 H Interval events - endorses shortness of breath   Assessment/Plan: Principal Problem:   Acute respiratory failure with hypoxia (HCC) Active Problems:   Hypertension, uncontrolled   PAD (peripheral artery disease) ([place])   Hx of CABG 2012   Diabetes mellitus type II, uncontrolled (HCC)   Pleural effusion, right   Acute on chronic diastolic congestive heart failure (HCC)   Anemia   Toe ulcer (HCC)   Unresponsive episode   Bradycardia, sinus   Palliative care encounter   Pleural effusion   Recurrent pleural effusion on right   Acute on chronic respiratory failure with hypoxia  - persistently on Ventimask up until yesterday, wean off as tolerated - continue diuresis with IV lasix - Pleurex placed yesterday, with 1.8 L fluid removed - will transition to po Lasix in 1 day - PT to evaluate now that her respiratory status has improved, hopeful d/c 1-2 days if stable  Acute on chronic diastolic congestive heart failure with pulmonary hypertension - Lasix was  increased on 10/3 to 120 mg BID. Convert to po tomorrow. - net negative 13 L; renal function holding  Pleural effusion, right, recurrent - s/p thoracentesis and Pleurex catheter by IR 10/6 - she is a non responder to Plavix based on P2Y12 assay - no malignancy seen on cytology   Hypertension, controlled - Coreg held due to severe bradycardia  Unresponsive episode with hypoxia, bradycardia and bradypnea.  - Coreg has been held. Heart rate in the 40s and 50s.  - stable now  PAD (peripheral artery disease)  - patient reports Dr. Gwenlyn Found to do a procedure on both legs at some point in the future.  - Has necrotic eschar on right second toe.Wound consult recommends painting with Betadine only.   Patient complaining of chronic leg pain - PAD? Neuropathy? see above. Will resume hydrocodone as needed. Resume gabapentin daily at bedtime. Watch for sedation.   Diabetes mellitus type II, with hypoglycemia - patient with intermittent hypoglycemia, Levemir adjusted, now on 7 U, no further hypoglycemic events   Anemia - no reported bleeding. Acute on chronic. Anemia panel consistent with chronic disease and a component of iron deficiency. Will add iron supplementation. Hemoccults ordered. Hemoglobin has actually improved with diuresis.   Long-term prognosis appears quite guarded.    Diet: Diet NPO time specified Except for: Sips with Meds Diet regular Room service appropriate?: Yes; Fluid consistency:: Thin Fluids: none  DVT Prophylaxis: heparin  Code Status: DNR Family Communication: no family bedside  Disposition Plan: TBD  Consultants:  Cardiology   IR  Procedures:  None  Antibiotics  Anti-infectives    Start     Dose/Rate Route Frequency Ordered Stop   10/19/14 0800  ceFAZolin (ANCEF) IVPB 2 g/50 mL premix     2 g 100 mL/hr over 30 Minutes Intravenous To Radiology 10/14/14 1145 10/15/14 1627   10/19/14 0000  ceFAZolin (ANCEF) powder 2 g  Status:  Discontinued      Comments:  Give in IR for surgical prophylaxis   2 g Other To Surgery 10/14/14 1132 10/14/14 1144      Studies  Dg Chest Port 1 View  10/16/2014   CLINICAL DATA:  Status post placement of a permanent right pleural drainage catheter.  EXAM: PORTABLE CHEST 1 VIEW  COMPARISON:  Fluoro spot images of October 15, 2014 and portable chest x-ray of October 12, 2014. None.  FINDINGS: The lungs are well-expanded. The pulmonary interstitial markings remain increased. Areas of near confluence in the left mid and lower lung are present and slightly more conspicuous today. The cardiac silhouette remains enlarged. The volume of pleural fluid on the right has decreased since placement of the small caliber chest tube whose tip projects over the midportion of the hemidiaphragm. There is no pneumothorax. The patient has undergone previous CABG.  IMPRESSION: 1. Interval decrease in the volume of the right pleural effusion. A small effusion remains. No pneumothorax. 2. Persistently increased pulmonary interstitial markings in the setting of cardiomegaly consistent with CHF. Areas of patchy confluent density in the left mid and lower lung persist and may reflect focal areas of atelectasis, alveolar edema, or pneumonia.   Electronically Signed   By: David  Martinique M.D.   On: 10/16/2014 07:58   Ir Perc Pleural Drain W/[place]well Cath W/img Guide  10/15/2014   CLINICAL DATA:  History of severe congestive heart failure with recurrent symptomatic right-sided pleural effusion. Please place right-sided tunneled pleural drainage catheter for palliative purposes.  EXAM: INSERTION OF TUNNELED RIGHT SIDED PLEURAL DRAINAGE CATHETER  COMPARISON:  Ultrasound-guided thoracentesis - 10/11/2014; 09/06/2014; chest radiograph - 10/12/2014; 09/08/2014; chest CT - 09/07/2014  MEDICATIONS: Ancef 1 gram IV; Antibiotic was administered in an appropriate time interval for the procedure.  ANESTHESIA/SEDATION: Versed 1 mg IV; Fentanyl 25 mcg IV  Total  Moderate Sedation Time  20 minutes.  FLUOROSCOPY TIME:  FLUOROSCOPY TIME 42 seconds (33.2 mGy)  COMPLICATIONS: None immediate  PROCEDURE: The procedure, risks, benefits, and alternatives were explained to the patient, who wish to proceed with the placement of this permanent pleural catheter as the patient is seeking palliative care. The patient understand and consent to the procedure.  The right lateral chest and upper abdomen were prepped with Chlorhexidine in a sterile fashion, and a sterile drape was applied covering the operative field. A sterile gown and sterile gloves were used for the procedure. Initial ultrasound scanning and fluoroscopic imaging demonstrates a recurrent moderate to large pleural effusion.  Under direct ultrasound guidance, the right inferior lateral pleural space was accessed with a Yueh sheath needle after the overlying soft tissues were anesthetized with 1% lidocaine with epinephrine. An Amplatz [place] stiff wire was then advanced under fluoroscopy into the pleural space.  A 15.5 French tunneled Pleur-X catheter was tunneled from an incision within the right upper abdominal quadrant to the access site. The pleural access site was serially dilated under fluoroscopy, ultimately allowing placement of a peel-away sheath. The catheter was advanced through the peel-away sheath. The sheath was then removed. Final catheter positioning was confirmed with a fluoroscopic radiographic image.  The access incision was  closed with subcutaneous subcuticular 4-0 Vicryl, Dermabond and Steri-Strips. A Prolene retention suture was applied at the catheter exit site. Large volume thoracentesis was performed through the new catheter utilizing provided bulb vacuum assisted drainage bag. The patient tolerated the above procedure well without immediate postprocedural complication.  FINDINGS: Preprocedural ultrasound scanning demonstrates a recurrent large sized right sided pleural effusion.  After ultrasound and  fluoroscopic guided placement, the catheter is coiled within the caudal aspect of the right pleural space.  Following catheter placement, approximately 1.8 L of serous, slightly brown colored pleural fluid was removed.  IMPRESSION: Successful placement of permanent, tunneled right pleural drainage catheter via lateral approach. 1.8 liters of serous, slightly brown colored pleural fluid was removed after catheter placement.   Electronically Signed   By: Sandi Mariscal M.D.   On: 10/15/2014 16:45   Objective  Filed Vitals:   10/16/14 0338 10/16/14 0443 10/16/14 0733 10/16/14 0816  BP:  140/40 156/50   Pulse:  50 55   Temp:  98.6 F (37 C) 98.5 F (36.9 C)   TempSrc:  Oral Oral   Resp:  19 18   Height:      Weight:      SpO2: 96% 96% 95% 94%    Intake/Output Summary (Last 24 hours) at 10/16/14 0940 Last data filed at 10/16/14 0549  Gross per 24 hour  Intake    127 ml  Output   5000 ml  Net  -4873 ml   Filed Weights   10/14/14 0327 10/15/14 0447 10/16/14 0215  Weight: 87.771 kg (193 lb 8 oz) 86.909 kg (191 lb 9.6 oz) 78.2 kg (172 lb 6.4 oz)   Exam:  GENERAL: in mild distress, tachypneic  HEENT: head NCAT, no scleral icterus. Pupils round and reactive.   NECK: Supple.   LUNGS:  No wheezing or crackles, overall poor air movement  HEART: Regular rate and rhythm without murmur. 2+ pulses, no JVD, no peripheral edema  ABDOMEN: Soft, nontender, and nondistended. Positive bowel sounds.   EXTREMITIES: Without any cyanosis, clubbing, rash, lesions or edema.  NEUROLOGIC: non focal   Data Reviewed: Basic Metabolic Panel:  Recent Labs Lab 10/12/14 0444 10/13/14 0440 10/14/14 0540 10/15/14 0408 10/16/14 0328  NA 139 139 135 136 135  K 3.7 4.5 4.4 4.5 4.3  CL 97* 98* 92* 92* 89*  CO2 34* 35* 34* 36* 37*  GLUCOSE 84 59* 97 130* 111*  BUN 30* 36* 32* 33* 31*  CREATININE 1.13* 1.39* 1.26* 1.20* 1.16*  CALCIUM 8.7* 8.7* 8.5* 8.6* 8.4*  MG 2.1  --   --   --   --    Liver  Function Tests:  Recent Labs Lab 10/12/14 0444  AST 24  ALT 23  ALKPHOS 69  BILITOT 0.7  PROT 6.4*  ALBUMIN 2.4*   CBC:  Recent Labs Lab 10/10/14 2153  10/12/14 0444 10/12/14 1709 10/13/14 0440 10/14/14 0540 10/15/14 0408 10/16/14 0328  WBC 11.2*  < > 9.0  --  10.2 11.5* 13.5* 9.9  NEUTROABS 9.8*  --   --   --   --   --   --   --   HGB 9.3*  < > 8.6* 8.3* 9.0* 9.0* 9.4* 9.0*  HCT 29.2*  < > 28.2* 27.4* 28.5* 28.9* 29.9* 28.7*  MCV 89.8  < > 89.5  --  89.6 89.8 90.1 89.4  PLT 190  < > 190  --  202 235 267 269  < > = values in this  interval not displayed. Cardiac Enzymes:  Recent Labs Lab 10/11/14 1021 10/11/14 1555 10/12/14 1709 10/12/14 2138 10/13/14 0440  TROPONINI 0.06* 0.10* 0.04* 0.08* 0.04*   BNP (last 3 results)  Recent Labs  09/09/14 0245 10/10/14 2153  BNP 436.8* 914.0*   CBG:  Recent Labs Lab 10/15/14 0751 10/15/14 1127 10/15/14 1659 10/15/14 2058 10/16/14 0732  GLUCAP 118* 175* 99 151* 89   Recent Results (from the past 240 hour(s))  Blood culture (routine x 2)     Status: None   Collection Time: 10/10/14  9:53 PM  Result Value Ref Range Status   Specimen Description BLOOD RIGHT ANTECUBITAL  Final   Special Requests BOTTLES DRAWN AEROBIC AND ANAEROBIC 6CC  Final   Culture NO GROWTH 5 DAYS  Final   Report Status 10/15/2014 FINAL  Final  Blood culture (routine x 2)     Status: None   Collection Time: 10/10/14 10:00 PM  Result Value Ref Range Status   Specimen Description BLOOD RIGHT ARM  Final   Special Requests BOTTLES DRAWN AEROBIC ONLY 6CC  Final   Culture NO GROWTH 5 DAYS  Final   Report Status 10/15/2014 FINAL  Final  MRSA PCR Screening     Status: None   Collection Time: 10/11/14  4:16 AM  Result Value Ref Range Status   MRSA by PCR NEGATIVE NEGATIVE Final    Comment:        The GeneXpert MRSA Assay (FDA approved for NASAL specimens only), is one component of a comprehensive MRSA colonization surveillance program. It is  not intended to diagnose MRSA infection nor to guide or monitor treatment for MRSA infections.   Culture, body fluid-bottle     Status: None (Preliminary result)   Collection Time: 10/11/14  9:57 AM  Result Value Ref Range Status   Specimen Description FLUID RIGHT PLEURAL  Final   Special Requests BAA 10CCS  Final   Culture NO GROWTH 4 DAYS  Final   Report Status PENDING  Incomplete  Gram stain     Status: None   Collection Time: 10/11/14  9:57 AM  Result Value Ref Range Status   Specimen Description FLUID RIGHT PLEURAL  Final   Special Requests NONE  Final   Gram Stain   Final    MODERATE WBC PRESENT, PREDOMINANTLY PMN NO ORGANISMS SEEN    Report Status 10/11/2014 FINAL  Final   Scheduled Meds: . amLODipine  10 mg Oral Daily  . antiseptic oral rinse  7 mL Mouth Rinse q12n4p  . aspirin EC  81 mg Oral Daily  . atorvastatin  40 mg Oral q1800  . chlorhexidine  15 mL Mouth Rinse BID  . citalopram  20 mg Oral Daily  . ferrous sulfate  325 mg Oral Q breakfast  . furosemide  120 mg Intravenous BID  . gabapentin  300 mg Oral QHS  . heparin subcutaneous  5,000 Units Subcutaneous 3 times per day  . hydrALAZINE  25 mg Oral TID  . insulin aspart  0-9 Units Subcutaneous TID WC  . insulin detemir  7 Units Subcutaneous QHS  . ipratropium-albuterol  3 mL Nebulization Q6H  . isosorbide mononitrate  45 mg Oral Daily  . lisinopril  20 mg Oral Daily  . multivitamin with minerals  1 tablet Oral Daily  . potassium chloride  10 mEq Oral BID  . sodium chloride  3 mL Intravenous Q12H    Continuous Infusions:   Marzetta Board, MD [facility] Pager [phone]. If 7  PM - 7 AM, please contact night-coverage at [email or web address], password [facility] 10/16/2014, 9:40 AM  LOS: 6 days

## 2014-10-16 NOTE — Progress Notes (Signed)
Physical Therapy Treatment Patient Details Name: [name] [name] MRN: [id number] DOB: [birth year] Today's Date: 10/16/2014    History of Present Illness Pt is a 69 y/o woman with a PMH htn, hlp, DM2, CAD s/p CABG 6295, chronic diastolic CHF EF 28%, pulmonary hypertension. She was recently admitted with acute on chronic respiratory failure complicated by diastolic heart failure and volume overload. Patient diuresed 5 pounds and also underwent ultrasound-guided thoracentesis. 10/3 she apparently had respiratory failure with bradycardia down to 36.     PT Comments    Patient weakened since initial evaluation with medical issues complicating rehab.  She may now need STSNF rather than to d/c home as needing moderate assist for sit to stand, but will continue to monitor and update recommendations as she progresses.  If family is capable to provide 24/7 physical help could potentially still return home with HHPT  Follow Up Recommendations  Supervision for mobility/OOB;SNF     Equipment Recommendations  None recommended by PT    Recommendations for Other Services       Precautions / Restrictions Precautions Precautions: Fall Precaution Comments: high O2 requirement    Mobility  Bed Mobility Overal bed mobility: Needs Assistance       Supine to sit: Mod assist     General bed mobility comments: assist for legs off bed and to lift trunk  Transfers Overall transfer level: Needs assistance Equipment used: Rolling walker (2 wheeled) Transfers: Sit to/from [facility] Sit to Stand: Mod assist Stand pivot transfers: Min assist       General transfer comment: assist from bed easier than from lower recliner; used RW to stand and turn to chair  Ambulation/Gait             General Gait Details: Not ready yet due to continued high O2 requirement and multiple lines   Stairs            Wheelchair Mobility    Modified Rankin (Stroke Patients Only)        Balance Overall balance assessment: Needs assistance           Standing balance-Leahy Scale: Poor Standing balance comment: UE support needed for balance                    Cognition Arousal/Alertness: Awake/alert Behavior During Therapy: Flat affect Overall Cognitive Status: Within Functional Limits for tasks assessed                      Exercises      General Comments        Pertinent Vitals/Pain Pain Assessment: Faces Faces Pain Scale: Hurts little more Pain Location: bottom with cleaning Pain Descriptors / Indicators: Sore Pain Intervention(s): Monitored during session;Repositioned;Other (comment) (cream applied)    Home Living                      Prior Function            PT Goals (current goals can now be found in the care plan section) Progress towards PT goals: Progressing toward goals    Frequency  Min 3X/week    PT Plan Discharge plan needs to be updated    Co-evaluation             End of Session Equipment Utilized During Treatment: Gait belt Activity Tolerance: Patient limited by fatigue Patient left: in chair;with call bell/phone within reach     Time: 4132-4401 PT Time  Calculation (min) (ACUTE ONLY): 27 min  Charges:  $Therapeutic Activity: 23-37 mins                    G Codes:      Tem[place]wa Recchia,CYNDI October 21, 2014, 2:07 PM Magda Kiel, [place] 21-Oct-2014

## 2014-10-17 DIAGNOSIS — E1165 Type 2 diabetes mellitus with hyperglycemia: Secondary | ICD-10-CM

## 2014-10-17 DIAGNOSIS — E118 Type 2 diabetes mellitus with unspecified complications: Secondary | ICD-10-CM

## 2014-10-17 LAB — BASIC METABOLIC PANEL
ANION GAP: 9 (ref 5–15)
BUN: 36 mg/dL — ABNORMAL HIGH (ref 6–20)
CHLORIDE: 92 mmol/L — AB (ref 101–111)
CO2: 37 mmol/L — ABNORMAL HIGH (ref 22–32)
CREATININE: 1.1 mg/dL — AB (ref 0.44–1.00)
Calcium: 8.7 mg/dL — ABNORMAL LOW (ref 8.9–10.3)
GFR calc non Af Amer: 51 mL/min — ABNORMAL LOW (ref >60)
GFR, EST AFRICAN AMERICAN: 59 mL/min — AB (ref >60)
Glucose, Bld: 156 mg/dL — ABNORMAL HIGH (ref 65–99)
POTASSIUM: 4.6 mmol/L (ref 3.5–5.1)
Sodium: 138 mmol/L (ref 135–145)

## 2014-10-17 LAB — GLUCOSE, CAPILLARY
GLUCOSE-CAPILLARY: 114 mg/dL — AB (ref 65–99)
GLUCOSE-CAPILLARY: 217 mg/dL — AB (ref 65–99)
GLUCOSE-CAPILLARY: 291 mg/dL — AB (ref 65–99)
GLUCOSE-CAPILLARY: 243 mg/dL — AB (ref 65–99)

## 2014-10-17 MED ORDER — FUROSEMIDE 80 MG PO TABS
80.0000 mg | ORAL_TABLET | Freq: Two times a day (BID) | ORAL | Status: DC
  Administered 2014-10-17 – 2014-10-19 (×4): 80 mg via ORAL
  Filled 2014-10-17 (×4): qty 1

## 2014-10-17 NOTE — Clinical Social Work Note (Signed)
Clinical Social Work Assessment  Patient Details  Name: [name] [name] MRN: [id number] Date of Birth: 1945/03/11  Date of referral:  10/17/14               Reason for consult:  Facility Placement                Permission sought to share information with:  Family Supports Permission granted to share information::  Yes, Verbal Permission Granted  Name::     Shanon Brow and [employer]  Relationship::  Son and Daughter in [place]:  [phone]  Housing/Transportation Living arrangements for the past 2 months:  [place] of Information:  Patient Patient Interpreter Needed:  None Criminal Activity/Legal Involvement Pertinent to Current Situation/Hospitalization:  No - Comment as needed Significant Relationships:  Adult Children, Other Family Members Lives with:  Adult Children Do you feel safe going back to the place where you live?  Yes Need for family participation in patient care:  Yes (Comment)  Care giving concerns:  Patient plans to communicate plans with family.  No family/friends at bedside at time of assessment.   Social Worker assessment / plan:  Holiday representative met with patient at bedside to offer support and discuss patient needs at discharge.  Patient states that she lives at home with her son, daughter in law, and grandson.  Patient is hopeful to return to that living situation but agreeable to ST-SNF placement at discharge.  Patient currently lives in [place] and has requested placement in [place] or close by.  CSW initiated new SNF search in [place] and [place].  Patient understanding of process.  CSW remains available for support and to facilitate patient discharge needs once medically stable.  Employment status:  Retired Forensic scientist:  Medicare PT Recommendations:  [place] / Referral to community resources:  [place]  Patient/Family's Response to care:  Patient verbalized  understanding of CSW role and appreciation for support and concern.  Patient plans to engage family in discharge plans upon their arrival to the hospital.  Patient/Family's Understanding of and Emotional Response to Diagnosis, Current Treatment, and Prognosis:  Patient with limited understanding for continued hospitalization, however expressed gratitude for medical staff.  Patient will likely seek assistance in facility choice.  Emotional Assessment Appearance:  Appears older than stated age Attitude/Demeanor/Rapport:  Inconsistent (Cooperative and Engaged) Affect (typically observed):  Accepting, Calm Orientation:  Oriented to Self, Oriented to Place, Oriented to  Time, Oriented to Situation Alcohol / Substance use:  Not Applicable Psych involvement (Current and /or in the community):  No (Comment)  Discharge Needs  Concerns to be addressed:  Discharge Planning Concerns, Care Coordination Readmission within the last 30 days:  No Current discharge risk:  Physical Impairment Barriers to Discharge:  Continued Medical Work up, Programmer, applications ([place])  Barbette Or, [place]

## 2014-10-17 NOTE — Progress Notes (Signed)
Right chest Pleurex drained and yielded 1,000 cc of serosanguinous pleural fluid, the flow was easy and uninterrupted. Pt tolerated the procedure well. Minimal pain reported by pt toward the end of the procedure. Will continue to monitor.  Ferdinand Lango, RN

## 2014-10-17 NOTE — Clinical Social Work Placement (Signed)
   CLINICAL SOCIAL WORK PLACEMENT  NOTE  Date:  10/17/2014  Patient Details  Name: [name] [name] MRN: [id number] Date of Birth: [birth year]  Clinical Social Work is seeking post-discharge placement for this patient at the [place] level of care (*CSW will initial, date and re-position this form in  chart as items are completed):  Yes   Patient/family provided with [place] Work Department's list of facilities offering this level of care within the geographic area requested by the patient (or if unable, by the patient's family).  Yes   Patient/family informed of their freedom to choose among providers that offer the needed level of care, that participate in Medicare, Medicaid or managed care program needed by the patient, have an available bed and are willing to accept the patient.  Yes   Patient/family informed of [facility]'s ownership interest in [facility] and [facility], as well as of the fact that they are under no obligation to receive care at these facilities.  PASRR submitted to EDS on 10/17/14     PASRR number received on       Existing PASRR number confirmed on       FL2 transmitted to all facilities in geographic area requested by pt/family on 10/17/14     FL2 transmitted to all facilities within larger geographic area on       Patient informed that his/her managed care company has contracts with or will negotiate with certain facilities, including the following:            Patient/family informed of bed offers received.  Patient chooses bed at       Physician recommends and patient chooses bed at      Patient to be transferred to   on  .  Patient to be transferred to facility by       Patient family notified on   of transfer.  Name of family member notified:        PHYSICIAN Please sign FL2     Additional Comment:   Needs 30 day note to be signed to submit for [place], [place]

## 2014-10-17 NOTE — Progress Notes (Signed)
PROGRESS NOTE  [name] [name] LXB:[id number] DOB: 1945-04-30 DOA: 10/10/2014 PCP: Glenda Chroman., MD   HPI: 69 yo female with chronic hypoxic respiratory failure, diastolic CHF, right pleural effusion tapped a month ago, PAD with chronic leg pain, DM admitted with acute respiratory failure secondary to pleural effusion and acute on chronic diastolic heart failure. Required BiPAP initially. Had thoracentesis yielding 1.4 L of transudate. Has been able to come off BiPAP, but had an episode of near arrest on 10/3 at which time patient was unresponsive, had a respiratory rate of about 5, hypoxic into the 60s on supplemental oxygen, and bradycardic into the 30s. She was placed on nonrebreather mask and gradually came around. All of her sedating medications have been held. Coreg held. Lasix increased to due to poor response to 80 mg IV twice a day. Also, has had problems with hypoglycemia, and insulin has been scaled back.  Subjective / 24 H Interval events - breathing has improved, stable on nasal cannula - denies chest pain / palpitations  Assessment/Plan: Principal Problem:   Acute respiratory failure with hypoxia (HCC) Active Problems:   Hypertension, uncontrolled   PAD (peripheral artery disease) ([place])   Hx of CABG 2012   Diabetes mellitus type II, uncontrolled (HCC)   Pleural effusion, right   Acute on chronic diastolic congestive heart failure (HCC)   Anemia   Toe ulcer (HCC)   Unresponsive episode   Bradycardia, sinus   Palliative care encounter   Pleural effusion   Recurrent pleural effusion on right   Acute on chronic respiratory failure with hypoxia  - persistently on Ventimask up until pleurex catheter placement, now stable on nasal cannula - continue diuresis with IV lasix - Pleurex placed 10/6, with 1.8 L fluid removed - discussed with cardiology today, transition to po Lasix 80 mg BID - drain pleurex every other day, today removed 1L - SW consult for SNF  placement  Acute on chronic diastolic congestive heart failure with pulmonary hypertension - po Lasix as above - net negative 15 L today; renal function holding  Pleural effusion, right, recurrent - s/p thoracentesis and Pleurex catheter by IR 10/6 - no malignancy seen on cytology  - repeat drainage today  Hypertension, controlled - Coreg held due to severe bradycardia  Unresponsive episode with hypoxia, bradycardia and bradypnea.  - Coreg has been held. Heart rate in the 40s and 50s.  - stable now  PAD (peripheral artery disease)  - patient reports Dr. Gwenlyn Found to do a procedure on both legs at some point in the future.  - Has necrotic eschar on right second toe.Wound consult recommends painting with Betadine only.   Patient complaining of chronic leg pain - Will resume hydrocodone as needed. Resume gabapentin daily at bedtime. Watch for sedation.   Diabetes mellitus type II, with hypoglycemia - patient with intermittent hypoglycemia, Levemir adjusted, now on 7 U, no further hypoglycemic events   Anemia - no reported bleeding. Acute on chronic. Anemia panel consistent with chronic disease and a component of iron deficiency. Will add iron supplementation. Hemoccults ordered. Hemoglobin has actually improved with diuresis.   Long-term prognosis appears quite guarded.    Diet: Diet NPO time specified Except for: Sips with Meds Diet regular Room service appropriate?: Yes; Fluid consistency:: Thin Fluids: none  DVT Prophylaxis: heparin  Code Status: DNR Family Communication: no family bedside  Disposition Plan: SNF 1 day if bed available  Consultants:  Cardiology   IR  Procedures:  None    Antibiotics  Anti-infectives    Start     Dose/Rate Route Frequency Ordered Stop   10/19/14 0800  ceFAZolin (ANCEF) IVPB 2 g/50 mL premix     2 g 100 mL/hr over 30 Minutes Intravenous To Radiology 10/14/14 1145 10/15/14 1627   10/19/14 0000  ceFAZolin (ANCEF) powder 2 g   Status:  Discontinued    Comments:  Give in IR for surgical prophylaxis   2 g Other To Surgery 10/14/14 1132 10/14/14 1144      Studies  Dg Chest Port 1 View  10/16/2014   CLINICAL DATA:  Status post placement of a permanent right pleural drainage catheter.  EXAM: PORTABLE CHEST 1 VIEW  COMPARISON:  Fluoro spot images of October 15, 2014 and portable chest x-ray of October 12, 2014. None.  FINDINGS: The lungs are well-expanded. The pulmonary interstitial markings remain increased. Areas of near confluence in the left mid and lower lung are present and slightly more conspicuous today. The cardiac silhouette remains enlarged. The volume of pleural fluid on the right has decreased since placement of the small caliber chest tube whose tip projects over the midportion of the hemidiaphragm. There is no pneumothorax. The patient has undergone previous CABG.  IMPRESSION: 1. Interval decrease in the volume of the right pleural effusion. A small effusion remains. No pneumothorax. 2. Persistently increased pulmonary interstitial markings in the setting of cardiomegaly consistent with CHF. Areas of patchy confluent density in the left mid and lower lung persist and may reflect focal areas of atelectasis, alveolar edema, or pneumonia.   Electronically Signed   By: David  Martinique M.D.   On: 10/16/2014 07:58   Ir Perc Pleural Drain W/[place] Cath W/img Guide  10/15/2014   CLINICAL DATA:  History of severe congestive heart failure with recurrent symptomatic right-sided pleural effusion. Please place right-sided tunneled pleural drainage catheter for palliative purposes.  EXAM: INSERTION OF TUNNELED RIGHT SIDED PLEURAL DRAINAGE CATHETER  COMPARISON:  Ultrasound-guided thoracentesis - 10/11/2014; 09/06/2014; chest radiograph - 10/12/2014; 09/08/2014; chest CT - 09/07/2014  MEDICATIONS: Ancef 1 gram IV; Antibiotic was administered in an appropriate time interval for the procedure.  ANESTHESIA/SEDATION: Versed 1 mg IV;  Fentanyl 25 mcg IV  Total Moderate Sedation Time  20 minutes.  FLUOROSCOPY TIME:  FLUOROSCOPY TIME 42 seconds (42.8 mGy)  COMPLICATIONS: None immediate  PROCEDURE: The procedure, risks, benefits, and alternatives were explained to the patient, who wish to proceed with the placement of this permanent pleural catheter as the patient is seeking palliative care. The patient understand and consent to the procedure.  The right lateral chest and upper abdomen were prepped with Chlorhexidine in a sterile fashion, and a sterile drape was applied covering the operative field. A sterile gown and sterile gloves were used for the procedure. Initial ultrasound scanning and fluoroscopic imaging demonstrates a recurrent moderate to large pleural effusion.  Under direct ultrasound guidance, the right inferior lateral pleural space was accessed with a Yueh sheath needle after the overlying soft tissues were anesthetized with 1% lidocaine with epinephrine. An Amplatz Diers stiff wire was then advanced under fluoroscopy into the pleural space.  A 15.5 French tunneled Pleur-X catheter was tunneled from an incision within the right upper abdominal quadrant to the access site. The pleural access site was serially dilated under fluoroscopy, ultimately allowing placement of a peel-away sheath. The catheter was advanced through the peel-away sheath. The sheath was then removed. Final catheter positioning was confirmed with a fluoroscopic radiographic image.  The access incision was closed with subcutaneous  subcuticular 4-0 Vicryl, Dermabond and Steri-Strips. A Prolene retention suture was applied at the catheter exit site. Large volume thoracentesis was performed through the new catheter utilizing provided bulb vacuum assisted drainage bag. The patient tolerated the above procedure well without immediate postprocedural complication.  FINDINGS: Preprocedural ultrasound scanning demonstrates a recurrent large sized right sided pleural  effusion.  After ultrasound and fluoroscopic guided placement, the catheter is coiled within the caudal aspect of the right pleural space.  Following catheter placement, approximately 1.8 L of serous, slightly brown colored pleural fluid was removed.  IMPRESSION: Successful placement of permanent, tunneled right pleural drainage catheter via lateral approach. 1.8 liters of serous, slightly brown colored pleural fluid was removed after catheter placement.   Electronically Signed   By: Sandi Mariscal M.D.   On: 10/15/2014 16:45   Objective  Filed Vitals:   10/17/14 0544 10/17/14 0732 10/17/14 0955 10/17/14 1125  BP: 138/35 153/37  149/45  Pulse: 50 59  52  Temp: 98 F (36.7 C) 98.7 F (37.1 C)  98.8 F (37.1 C)  TempSrc: Oral Oral  Oral  Resp: _0 Height:      Weight:      SpO2: 93% 90% 93% 94%    Intake/Output Summary (Last 24 hours) at 10/17/14 1245 Last data filed at 10/17/14 0500  Gross per 24 hour  Intake      0 ml  Output   1400 ml  Net  -1400 ml   Filed Weights   10/14/14 0327 10/15/14 0447 10/16/14 0215  Weight: 87.771 kg (193 lb 8 oz) 86.909 kg (191 lb 9.6 oz) 78.2 kg (172 lb 6.4 oz)   Exam:  GENERAL: NAD  HEENT: head NCAT, no scleral icterus. Pupils round and reactive.   NECK: Supple.   LUNGS:  No wheezing or crackles, overall poor air movement  HEART: Regular rate and rhythm without murmur. 2+ pulses, no JVD, no peripheral edema  ABDOMEN: Soft, nontender, and nondistended. Positive bowel sounds.   EXTREMITIES: Without any cyanosis, clubbing  NEUROLOGIC: non focal   Data Reviewed: Basic Metabolic Panel:  Recent Labs Lab 10/12/14 0444 10/13/14 0440 10/14/14 0540 10/15/14 0408 10/16/14 0328 10/17/14 0252  NA 139 139 135 136 135 138  K 3.7 4.5 4.4 4.5 4.3 4.6  CL 97* 98* 92* 92* 89* 92*  CO2 34* 35* 34* 36* 37* 37*  GLUCOSE 84 59* 97 130* 111* 156*  BUN 30* 36* 32* 33* 31* 36*  CREATININE 1.13* 1.39* 1.26* 1.20* 1.16* 1.10*  CALCIUM 8.7* 8.7*  8.5* 8.6* 8.4* 8.7*  MG 2.1  --   --   --   --   --    Liver Function Tests:  Recent Labs Lab 10/12/14 0444  AST 24  ALT 23  ALKPHOS 69  BILITOT 0.7  PROT 6.4*  ALBUMIN 2.4*   CBC:  Recent Labs Lab 10/10/14 2153  10/12/14 0444 10/12/14 1709 10/13/14 0440 10/14/14 0540 10/15/14 0408 10/16/14 0328  WBC 11.2*  < > 9.0  --  10.2 11.5* 13.5* 9.9  NEUTROABS 9.8*  --   --   --   --   --   --   --   HGB 9.3*  < > 8.6* 8.3* 9.0* 9.0* 9.4* 9.0*  HCT 29.2*  < > 28.2* 27.4* 28.5* 28.9* 29.9* 28.7*  MCV 89.8  < > 89.5  --  89.6 89.8 90.1 89.4  PLT 190  < > 190  --  202 235 267  269  < > = values in this interval not displayed. Cardiac Enzymes:  Recent Labs Lab 10/11/14 1021 10/11/14 1555 10/12/14 1709 10/12/14 2138 10/13/14 0440  TROPONINI 0.06* 0.10* 0.04* 0.08* 0.04*   BNP (last 3 results)  Recent Labs  09/09/14 0245 10/10/14 2153  BNP 436.8* 914.0*   CBG:  Recent Labs Lab 10/16/14 1112 10/16/14 1632 10/16/14 2157 10/17/14 0730 10/17/14 1123  GLUCAP 184* 296* 258* 114* 217*   Recent Results (from the past 240 hour(s))  Blood culture (routine x 2)     Status: None   Collection Time: 10/10/14  9:53 PM  Result Value Ref Range Status   Specimen Description BLOOD RIGHT ANTECUBITAL  Final   Special Requests BOTTLES DRAWN AEROBIC AND ANAEROBIC 6CC  Final   Culture NO GROWTH 5 DAYS  Final   Report Status 10/15/2014 FINAL  Final  Blood culture (routine x 2)     Status: None   Collection Time: 10/10/14 10:00 PM  Result Value Ref Range Status   Specimen Description BLOOD RIGHT ARM  Final   Special Requests BOTTLES DRAWN AEROBIC ONLY 6CC  Final   Culture NO GROWTH 5 DAYS  Final   Report Status 10/15/2014 FINAL  Final  MRSA PCR Screening     Status: None   Collection Time: 10/11/14  4:16 AM  Result Value Ref Range Status   MRSA by PCR NEGATIVE NEGATIVE Final    Comment:        The GeneXpert MRSA Assay (FDA approved for NASAL specimens only), is one  component of a comprehensive MRSA colonization surveillance program. It is not intended to diagnose MRSA infection nor to guide or monitor treatment for MRSA infections.   Culture, body fluid-bottle     Status: None   Collection Time: 10/11/14  9:57 AM  Result Value Ref Range Status   Specimen Description FLUID RIGHT PLEURAL  Final   Special Requests BAA 10CCS  Final   Culture NO GROWTH 5 DAYS  Final   Report Status 10/16/2014 FINAL  Final  Gram stain     Status: None   Collection Time: 10/11/14  9:57 AM  Result Value Ref Range Status   Specimen Description FLUID RIGHT PLEURAL  Final   Special Requests NONE  Final   Gram Stain   Final    MODERATE WBC PRESENT, PREDOMINANTLY PMN NO ORGANISMS SEEN    Report Status 10/11/2014 FINAL  Final   Scheduled Meds: . amLODipine  10 mg Oral Daily  . antiseptic oral rinse  7 mL Mouth Rinse q12n4p  . aspirin EC  81 mg Oral Daily  . atorvastatin  40 mg Oral q1800  . chlorhexidine  15 mL Mouth Rinse BID  . citalopram  20 mg Oral Daily  . ferrous sulfate  325 mg Oral Q breakfast  . furosemide  80 mg Oral BID  . gabapentin  300 mg Oral QHS  . heparin subcutaneous  5,000 Units Subcutaneous 3 times per day  . hydrALAZINE  25 mg Oral TID  . insulin aspart  0-9 Units Subcutaneous TID WC  . insulin detemir  7 Units Subcutaneous QHS  . ipratropium-albuterol  3 mL Nebulization Q6H  . isosorbide mononitrate  45 mg Oral Daily  . lisinopril  20 mg Oral Daily  . multivitamin with minerals  1 tablet Oral Daily  . potassium chloride  10 mEq Oral BID  . sodium chloride  3 mL Intravenous Q12H    Continuous Infusions:   Waleska Buttery,  MD [facility] Pager [phone]. If 7 PM - 7 AM, please contact night-coverage at [email or web address], password [facility] 10/17/2014, 12:45 PM  LOS: 7 days

## 2014-10-18 LAB — GLUCOSE, CAPILLARY
GLUCOSE-CAPILLARY: 125 mg/dL — AB (ref 65–99)
GLUCOSE-CAPILLARY: 279 mg/dL — AB (ref 65–99)
Glucose-Capillary: 315 mg/dL — ABNORMAL HIGH (ref 65–99)
Glucose-Capillary: 269 mg/dL — ABNORMAL HIGH (ref 65–99)

## 2014-10-18 LAB — BASIC METABOLIC PANEL
Anion gap: 8 (ref 5–15)
BUN: 32 mg/dL — AB (ref 6–20)
CALCIUM: 8.4 mg/dL — AB (ref 8.9–10.3)
CHLORIDE: 88 mmol/L — AB (ref 101–111)
CO2: 37 mmol/L — ABNORMAL HIGH (ref 22–32)
CREATININE: 1.02 mg/dL — AB (ref 0.44–1.00)
GFR, EST NON AFRICAN AMERICAN: 56 mL/min — AB (ref >60)
Glucose, Bld: 174 mg/dL — ABNORMAL HIGH (ref 65–99)
Potassium: 4.7 mmol/L (ref 3.5–5.1)
SODIUM: 133 mmol/L — AB (ref 135–145)

## 2014-10-18 MED ORDER — IPRATROPIUM-ALBUTEROL 0.5-2.5 (3) MG/3ML IN SOLN
3.0000 mL | Freq: Three times a day (TID) | RESPIRATORY_TRACT | Status: DC
  Administered 2014-10-18 – 2014-10-19 (×4): 3 mL via RESPIRATORY_TRACT
  Filled 2014-10-18 (×4): qty 3

## 2014-10-18 MED ORDER — ENSURE ENLIVE PO LIQD
237.0000 mL | Freq: Two times a day (BID) | ORAL | Status: DC
  Administered 2014-10-18 – 2014-10-19 (×3): 237 mL via ORAL

## 2014-10-18 NOTE — Progress Notes (Signed)
Pt unable to d/c today as no one was available to admissions at the [facility] today.  Unit CSW to f/u in am.  MD informed via text page.

## 2014-10-18 NOTE — Progress Notes (Signed)
CSW confirmed with pt that she will accept bed offer from [facility].  CSW has called [facility] Admission Coordinator and left message re: pt's pending d/c today.  Await return call.

## 2014-10-18 NOTE — Progress Notes (Signed)
PROGRESS NOTE  [name] [name] ZOX:[id number] DOB: 04-08-1945 DOA: 10/10/2014 PCP: Glenda Chroman., MD  HPI: 69 yo female with chronic hypoxic respiratory failure, diastolic CHF, right pleural effusion tapped a month ago, PAD with chronic leg pain, DM admitted with acute respiratory failure secondary to pleural effusion and acute on chronic diastolic heart failure. Required BiPAP initially. Had thoracentesis yielding 1.4 L of transudate. Has been able to come off BiPAP, but had an episode of near arrest on 10/3 at which time patient was unresponsive, had a respiratory rate of about 5, hypoxic into the 60s on supplemental oxygen, and bradycardic into the 30s. She was placed on nonrebreather mask and gradually came around. All of her sedating medications have been held. Coreg held. Lasix increased to due to poor response to 80 mg IV twice a day. Also, has had problems with hypoglycemia, and insulin has been scaled back.  Subjective / 24 H Interval events - no chest pain, shortness of breath, no abdominal pain, nausea or vomiting.   Assessment/Plan: Principal Problem:   Acute respiratory failure with hypoxia (HCC) Active Problems:   Hypertension, uncontrolled   PAD (peripheral artery disease) ([place])   Hx of CABG 2012   Diabetes mellitus type II, uncontrolled (HCC)   Pleural effusion, right   Acute on chronic diastolic congestive heart failure (HCC)   Anemia   Toe ulcer (HCC)   Unresponsive episode   Bradycardia, sinus   Palliative care encounter   Pleural effusion   Recurrent pleural effusion on right   Acute on chronic respiratory failure with hypoxia due to acute on chronic diastolic CHF and recurrent pleural effusion - persistently on Ventimask up until pleurex catheter placement, now stable on nasal cannula. Cardiology was consulted and have followed patient while hospitalized. She was diuresed with IV Lasix which was transitioned to po Lasix 80 mg BID on 10/8. Her renal function has  remained stable with diuresis. Given recurrent pleural effusion, IR was consulted and patient underwent Pleurex placement on 10/6 and will need fluid removed every other day.  - SW consult for SNF placement, will have a bed on Monday   Acute on chronic diastolic congestive heart failure with pulmonary hypertension - po Lasix as above, net negative 17 L today; renal function holding  Pleural effusion, right, recurrent - s/p thoracentesis and Pleurex catheter by IR 10/6,  no malignancy seen on cytology  - repeat drainage tomorrow  Hypertension, controlled - Coreg held due to severe bradycardia  Unresponsive episode with hypoxia, bradycardia and bradypnea.  - Coreg has been held. Heart rate in the 40s and 50s.  - stable now  PAD (peripheral artery disease)  - patient reports Dr. Gwenlyn Found to do a procedure on both legs at some point in the future.  - Has necrotic eschar on right second toe.Wound consult recommends painting with Betadine only.   Patient complaining of chronic leg pain - Will resume hydrocodone as needed. Resume gabapentin daily at bedtime. Watch for sedation.   Diabetes mellitus type II, with hypoglycemia - patient with intermittent hypoglycemia, Levemir adjusted, now on 7 U, no further hypoglycemic events   Anemia - no reported bleeding. Acute on chronic. Anemia panel consistent with chronic disease and a component of iron deficiency. Will add iron supplementation. Hemoccults ordered. Hemoglobin has actually improved with diuresis.   Long-term prognosis appears quite guarded.    Diet: Diet NPO time specified Except for: Sips with Meds Diet regular Room service appropriate?: Yes; Fluid consistency:: Thin Fluids: none  DVT Prophylaxis: heparin  Code Status: DNR Family Communication: no family bedside  Disposition Plan: SNF Monday if bed available  Consultants:  Cardiology   IR  Procedures:  None    Antibiotics  Anti-infectives    Start     Dose/Rate  Route Frequency Ordered Stop   10/19/14 0800  ceFAZolin (ANCEF) IVPB 2 g/50 mL premix     2 g 100 mL/hr over 30 Minutes Intravenous To Radiology 10/14/14 1145 10/15/14 1627   10/19/14 0000  ceFAZolin (ANCEF) powder 2 g  Status:  Discontinued    Comments:  Give in IR for surgical prophylaxis   2 g Other To Surgery 10/14/14 1132 10/14/14 1144      Studies  No results found. Objective  Filed Vitals:   10/18/14 0506 10/18/14 0800 10/18/14 0905 10/18/14 0933  BP:    151/40  Pulse:      Temp: 98.5 F (36.9 C) 97.3 F (36.3 C)    TempSrc: Oral Axillary    Resp:      Height:      Weight: 79.878 kg (176 lb 1.6 oz)     SpO2:   95%     Intake/Output Summary (Last 24 hours) at 10/18/14 1336 Last data filed at 10/18/14 0518  Gross per 24 hour  Intake    480 ml  Output   2150 ml  Net  -1670 ml   Filed Weights   10/15/14 0447 10/16/14 0215 10/18/14 0506  Weight: 86.909 kg (191 lb 9.6 oz) 78.2 kg (172 lb 6.4 oz) 79.878 kg (176 lb 1.6 oz)   Exam:  GENERAL: NAD  HEENT: head NCAT, no scleral icterus.   LUNGS:  No wheezing or crackles, overall poor air movement  HEART: Regular rate and rhythm without murmur. 2+ pulses, no JVD, no peripheral edema  ABDOMEN: Soft, nontender, and nondistended. Positive bowel sounds.   Data Reviewed: Basic Metabolic Panel:  Recent Labs Lab 10/12/14 0444  10/14/14 0540 10/15/14 0408 10/16/14 0328 10/17/14 0252 10/18/14 0426  NA 139  < > 135 136 135 138 133*  K 3.7  < > 4.4 4.5 4.3 4.6 4.7  CL 97*  < > 92* 92* 89* 92* 88*  CO2 34*  < > 34* 36* 37* 37* 37*  GLUCOSE 84  < > 97 130* 111* 156* 174*  BUN 30*  < > 32* 33* 31* 36* 32*  CREATININE 1.13*  < > 1.26* 1.20* 1.16* 1.10* 1.02*  CALCIUM 8.7*  < > 8.5* 8.6* 8.4* 8.7* 8.4*  MG 2.1  --   --   --   --   --   --   < > = values in this interval not displayed. Liver Function Tests:  Recent Labs Lab 10/12/14 0444  AST 24  ALT 23  ALKPHOS 69  BILITOT 0.7  PROT 6.4*  ALBUMIN 2.4*    CBC:  Recent Labs Lab 10/12/14 0444 10/12/14 1709 10/13/14 0440 10/14/14 0540 10/15/14 0408 10/16/14 0328  WBC 9.0  --  10.2 11.5* 13.5* 9.9  HGB 8.6* 8.3* 9.0* 9.0* 9.4* 9.0*  HCT 28.2* 27.4* 28.5* 28.9* 29.9* 28.7*  MCV 89.5  --  89.6 89.8 90.1 89.4  PLT 190  --  202 235 267 269   Cardiac Enzymes:  Recent Labs Lab 10/11/14 1555 10/12/14 1709 10/12/14 2138 10/13/14 0440  TROPONINI 0.10* 0.04* 0.08* 0.04*   BNP (last 3 results)  Recent Labs  09/09/14 0245 10/10/14 2153  BNP 436.8* 914.0*   CBG:  Recent Labs Lab 10/17/14 0730 10/17/14 1123 10/17/14 1634 10/17/14 2054 10/18/14 0817  GLUCAP 114* 217* 291* 243* 125*   Recent Results (from the past 240 hour(s))  Blood culture (routine x 2)     Status: None   Collection Time: 10/10/14  9:53 PM  Result Value Ref Range Status   Specimen Description BLOOD RIGHT ANTECUBITAL  Final   Special Requests BOTTLES DRAWN AEROBIC AND ANAEROBIC 6CC  Final   Culture NO GROWTH 5 DAYS  Final   Report Status 10/15/2014 FINAL  Final  Blood culture (routine x 2)     Status: None   Collection Time: 10/10/14 10:00 PM  Result Value Ref Range Status   Specimen Description BLOOD RIGHT ARM  Final   Special Requests BOTTLES DRAWN AEROBIC ONLY 6CC  Final   Culture NO GROWTH 5 DAYS  Final   Report Status 10/15/2014 FINAL  Final  MRSA PCR Screening     Status: None   Collection Time: 10/11/14  4:16 AM  Result Value Ref Range Status   MRSA by PCR NEGATIVE NEGATIVE Final    Comment:        The GeneXpert MRSA Assay (FDA approved for NASAL specimens only), is one component of a comprehensive MRSA colonization surveillance program. It is not intended to diagnose MRSA infection nor to guide or monitor treatment for MRSA infections.   Culture, body fluid-bottle     Status: None   Collection Time: 10/11/14  9:57 AM  Result Value Ref Range Status   Specimen Description FLUID RIGHT PLEURAL  Final   Special Requests BAA 10CCS   Final   Culture NO GROWTH 5 DAYS  Final   Report Status 10/16/2014 FINAL  Final  Gram stain     Status: None   Collection Time: 10/11/14  9:57 AM  Result Value Ref Range Status   Specimen Description FLUID RIGHT PLEURAL  Final   Special Requests NONE  Final   Gram Stain   Final    MODERATE WBC PRESENT, PREDOMINANTLY PMN NO ORGANISMS SEEN    Report Status 10/11/2014 FINAL  Final   Scheduled Meds: . amLODipine  10 mg Oral Daily  . antiseptic oral rinse  7 mL Mouth Rinse q12n4p  . aspirin EC  81 mg Oral Daily  . atorvastatin  40 mg Oral q1800  . chlorhexidine  15 mL Mouth Rinse BID  . citalopram  20 mg Oral Daily  . feeding supplement (ENSURE ENLIVE)  237 mL Oral BID BM  . ferrous sulfate  325 mg Oral Q breakfast  . furosemide  80 mg Oral BID  . gabapentin  300 mg Oral QHS  . heparin subcutaneous  5,000 Units Subcutaneous 3 times per day  . hydrALAZINE  25 mg Oral TID  . insulin aspart  0-9 Units Subcutaneous TID WC  . insulin detemir  7 Units Subcutaneous QHS  . ipratropium-albuterol  3 mL Nebulization TID  . isosorbide mononitrate  45 mg Oral Daily  . lisinopril  20 mg Oral Daily  . multivitamin with minerals  1 tablet Oral Daily  . potassium chloride  10 mEq Oral BID  . sodium chloride  3 mL Intravenous Q12H    Continuous Infusions:   Marzetta Board, MD [facility] Pager [phone]. If 7 PM - 7 AM, please contact night-coverage at [email or web address], password [facility] 10/18/2014, 1:36 PM  LOS: 8 days

## 2014-10-19 LAB — GLUCOSE, CAPILLARY
Glucose-Capillary: 165 mg/dL — ABNORMAL HIGH (ref 65–99)
Glucose-Capillary: 219 mg/dL — ABNORMAL HIGH (ref 65–99)

## 2014-10-19 MED ORDER — INSULIN DETEMIR 100 UNIT/ML ~~LOC~~ SOLN: 7.0000 [IU] | Freq: Every day | SUBCUTANEOUS | Status: DC

## 2014-10-19 MED ORDER — HYDROCODONE-ACETAMINOPHEN 5-325 MG PO TABS: 1.0000 | ORAL_TABLET | Freq: Four times a day (QID) | ORAL | Status: DC | PRN

## 2014-10-19 MED ORDER — ISOSORBIDE MONONITRATE ER 30 MG PO TB24: 45.0000 mg | ORAL_TABLET | Freq: Every day | ORAL | Status: DC

## 2014-10-19 MED ORDER — FERROUS SULFATE 325 (65 FE) MG PO TABS: 325.0000 mg | ORAL_TABLET | Freq: Every day | ORAL | Status: DC

## 2014-10-19 MED ORDER — FUROSEMIDE 80 MG PO TABS: 80.0000 mg | ORAL_TABLET | Freq: Two times a day (BID) | ORAL | Status: DC

## 2014-10-19 MED ORDER — INSULIN ASPART 100 UNIT/ML ~~LOC~~ SOLN
3.0000 [IU] | Freq: Three times a day (TID) | SUBCUTANEOUS | Status: DC
  Administered 2014-10-19: 3 [IU] via SUBCUTANEOUS

## 2014-10-19 MED ORDER — INSULIN ASPART 100 UNIT/ML ~~LOC~~ SOLN: 3.0000 [IU] | Freq: Three times a day (TID) | SUBCUTANEOUS | Status: DC

## 2014-10-19 NOTE — Discharge Summary (Addendum)
Physician Discharge Summary  [name] [name] GEZ:[id number] DOB: 1945-09-14 DOA: 10/10/2014  PCP: Glenda Chroman., MD  Admit date: 10/10/2014 Discharge date: 10/19/2014  Time spent: > 40 minutes  Recommendations for Outpatient Follow-up:  1. Follow up with Dr. Woody Seller in 1-2 weeks 2. Follow up with Dr. Scot Dock as scheduled 3. Follow up with dr. Domenic Polite as scheduled 4. Monitor renal function twice weekly 5. Pleurex care per protocol 6. Drain pleurex every other day. If no drainage is noted space out to every 3-4 days. 7. Recommend palliative consult at SNF 8. Follow up with SNF MD in 1 week  Discharge Diagnoses:  Principal Problem:   Acute respiratory failure with hypoxia ([place]) Active Problems:   Hypertension, uncontrolled   PAD (peripheral artery disease) ([place])   Hx of CABG 2012   Diabetes mellitus type II, uncontrolled (HCC)   Pleural effusion, right   Acute on chronic diastolic congestive heart failure (HCC)   Anemia   Toe ulcer (HCC)   Unresponsive episode   Bradycardia, sinus   Palliative care encounter   Pleural effusion   Recurrent pleural effusion on right  Discharge Condition: stable  Diet recommendation: heart healthy  Filed Weights   10/16/14 0215 10/18/14 0506 10/19/14 0539  Weight: 78.2 kg (172 lb 6.4 oz) 79.878 kg (176 lb 1.6 oz) 80.559 kg (177 lb 9.6 oz)   History of present illness:  69 yo female h/o chronic hypoxic resp failure on home oxygen, chronic combined chf, hospitalized last month for chf and right pleural effusion which required thoracentesis deemed to be due to heart failre has been doing well. Today she started having worsening sob. Denies fevers. No chest pain. Her le are not more swollen than normal, its about the same as it always has been. She can provide little history due to her on bipap in the ED, she is mentating normally but can only answer yes/no questions. She says she has been taking her meds. Her daughter is present and provides  most of the history. Pt referred for admission for acute chf exacerbation,. Pt does confirm with me she is DNR.  Hospital Course:  Acute on chronic respiratory failure with hypoxia due to acute on chronic diastolic CHF and recurrent pleural effusion - persistently on Ventimask up until pleurex catheter placement, now stable on nasal cannula. Cardiology was consulted and have followed patient while hospitalized. She was diuresed with IV Lasix which was transitioned to po Lasix 80 mg BID on 10/8. Her renal function has remained stable with diuresis. Given recurrent pleural effusion, IR was consulted and patient underwent Pleurex placement on 10/6 and will need fluid removed every other day. She is net negative 19 L this hospitalization and her weight has stabilized at 176-177 lbs on current regimen.  Acute on chronic diastolic congestive heart failure with pulmonary hypertension - po Lasix as above Pleural effusion, right, recurrent - s/p thoracentesis and Pleurex catheter by IR 10/6, no malignancy seen on cytology Hypertension, controlled - Coreg held due to severe bradycardia Unresponsive episode with hypoxia, bradycardia and bradypnea. - Coreg has been held. Heart rate in the 40s and 50s. This is stable now PAD (peripheral artery disease) - patient has an appointment with vascular surgery in 3 weeks. No acute ischemia.  Patient complaining of chronic leg pain - Will resume hydrocodone as needed. Resume gabapentin daily at bedtime. Watch for sedation.  Diabetes mellitus type II, with hypoglycemia - patient with intermittent hypoglycemia, Levemir was adjusted, now on 7 U, no further  hypoglycemic events  Anemia - no reported bleeding. Acute on chronic. Anemia panel consistent with chronic disease and a component of iron deficiency. Will add iron supplementation. Hemoglobin has actually improved with diuresis.  Long-term prognosis appears quite guarded. Recommend palliative care to follow at SNF.    Procedures:  IR Pleurex placement   Consultations:  IR  Cardiology  Palliative care  Discharge Exam: Filed Vitals:   10/19/14 0539 10/19/14 0834 10/19/14 0942 10/19/14 0944  BP: 158/45  166/45 166/45  Pulse: 53     Temp: 98.3 F (36.8 C)     TempSrc: Oral     Resp: 14     Height:      Weight: 80.559 kg (177 lb 9.6 oz)     SpO2: 99% 93%      General: NAD Cardiovascular: RRR Respiratory: CTA biL  Discharge Instructions     Medication List    STOP taking these medications        carvedilol 25 MG tablet  Commonly known as:  COREG     glimepiride 2 MG tablet  Commonly known as:  AMARYL     HAIR/SKIN/NAILS Tabs     isosorbide dinitrate 30 MG tablet  Commonly known as:  ISORDIL     torsemide 20 MG tablet  Commonly known as:  DEMADEX      TAKE these medications        acetaminophen 500 MG tablet  Commonly known as:  TYLENOL  Take 1,000 mg by mouth every 6 (six) hours as needed.     albuterol 108 (90 BASE) MCG/ACT inhaler  Commonly known as:  PROVENTIL HFA;VENTOLIN HFA  Inhale 2 puffs into the lungs daily as needed for wheezing or shortness of breath.     amLODipine 10 MG tablet  Commonly known as:  NORVASC  Take 1 tablet (10 mg total) by mouth daily.     aspirin 81 MG EC tablet  Take 81 mg by mouth daily.     atorvastatin 40 MG tablet  Commonly known as:  LIPITOR  Take 40 mg by mouth daily at 6 PM.     citalopram 20 MG tablet  Commonly known as:  CELEXA  Take 20 mg by mouth daily.     clopidogrel 75 MG tablet  Commonly known as:  PLAVIX  Take 1 tablet (75 mg total) by mouth daily with breakfast.     ferrous sulfate 325 (65 FE) MG tablet  Take 1 tablet (325 mg total) by mouth daily with breakfast.     furosemide 80 MG tablet  Commonly known as:  LASIX  Take 1 tablet (80 mg total) by mouth 2 (two) times daily.     gabapentin 300 MG capsule  Commonly known as:  NEURONTIN  Take 300 mg by mouth daily.     hydrALAZINE 25 MG tablet   Commonly known as:  APRESOLINE  Take 25 mg by mouth 3 (three) times daily.     HYDROcodone-acetaminophen 5-325 MG tablet  Commonly known as:  NORCO/VICODIN  Take 1 tablet by mouth every 6 (six) hours as needed for moderate pain.     insulin aspart 100 UNIT/ML injection  Commonly known as:  novoLOG  Inject 0-8 Units into the skin 3 (three) times daily as needed for high blood sugar. >150 = 2 units, >200 = 4 units, >250 = 6 units, >300 = 8 units     insulin aspart 100 UNIT/ML injection  Commonly known as:  novoLOG  Inject 3  Units into the skin 3 (three) times daily with meals.     insulin detemir 100 UNIT/ML injection  Commonly known as:  LEVEMIR  Inject 0.07 mLs (7 Units total) into the skin at bedtime.     isosorbide mononitrate 30 MG 24 hr tablet  Commonly known as:  IMDUR  Take 1.5 tablets (45 mg total) by mouth daily.     lisinopril 20 MG tablet  Commonly known as:  PRINIVIL,ZESTRIL  Take 1 tablet (20 mg total) by mouth daily.     multivitamin with minerals Tabs tablet  Take 1 tablet by mouth daily.     nitroGLYCERIN 0.4 MG SL tablet  Commonly known as:  NITROSTAT  Place 1 tablet (0.4 mg total) under the tongue every 5 (five) minutes as needed for chest pain. Up to 3 doses. If no relief after 3rd dose, proceed to the ED for an evaluation     potassium chloride 10 MEQ tablet  Commonly known as:  K-DUR  Take 1 tablet (10 mEq total) by mouth daily.     STOOL SOFTENER PO  Take 100 mg by mouth daily as needed (for constipation).     Umeclidinium-Vilanterol 62.5-25 MCG/INH Aepb  Inhale 1 application into the lungs daily.        The results of significant diagnostics from this hospitalization (including imaging, microbiology, ancillary and laboratory) are listed below for reference.    Significant Diagnostic Studies: Dg Chest 1 View  10/12/2014   CLINICAL DATA:  Respiratory distress over the last day.  EXAM: CHEST 1 VIEW  COMPARISON:  10/11/2014 and previous   FINDINGS: Previous median sternotomy and CABG. Persistent cardiomegaly. Venous hypertension with interstitial and alveolar edema. Residual pleural fluid on the right with associated volume loss in the right lung. No pneumothorax. Possible slight reaccumulation of the right effusions since yesterday.  IMPRESSION: Persistent evidence of congestive heart failure with interstitial and alveolar edema. Right effusion, previously tapped. Possible slight reaccumulation of the right effusion. Associated atelectasis in the right lung secondary to the fusion.   Electronically Signed   By: Nelson Chimes M.D.   On: 10/12/2014 16:22   Dg Chest 1 View  10/11/2014   CLINICAL DATA:  Post right thoracentesis.  Short of breath  EXAM: CHEST 1 VIEW  COMPARISON:  10/10/2014  FINDINGS: Significant decrease in right pleural effusion. Small right effusion and right lower lobe atelectasis remains. No pneumothorax  Cardiac enlargement.  Congestive heart failure with mild edema.  IMPRESSION: Improvement in right pleural effusion. Small residual right effusion and right lower lobe atelectasis. No pneumothorax  Congestive heart failure with mild edema.   Electronically Signed   By: Franchot Gallo M.D.   On: 10/11/2014 10:06   Dg Chest 2 View  10/10/2014   CLINICAL DATA:  Shortness of breath since yesterday  EXAM: CHEST  2 VIEW  COMPARISON:  09/08/2014  FINDINGS: Large right pleural effusion obscuring the majority of the right lung.  Cardiomegaly and mild pulmonary edema.  Status post CABG.  IMPRESSION: 1. Large right pleural effusion obscuring most of the right lung. 2. Cardiomegaly and mild pulmonary edema.   Electronically Signed   By: Monte Fantasia M.D.   On: 10/10/2014 21:58   Dg Chest Port 1 View  10/16/2014   CLINICAL DATA:  Status post placement of a permanent right pleural drainage catheter.  EXAM: PORTABLE CHEST 1 VIEW  COMPARISON:  Fluoro spot images of October 15, 2014 and portable chest x-ray of October 12, 2014. None.  FINDINGS: The lungs are well-expanded. The pulmonary interstitial markings remain increased. Areas of near confluence in the left mid and lower lung are present and slightly more conspicuous today. The cardiac silhouette remains enlarged. The volume of pleural fluid on the right has decreased since placement of the small caliber chest tube whose tip projects over the midportion of the hemidiaphragm. There is no pneumothorax. The patient has undergone previous CABG.  IMPRESSION: 1. Interval decrease in the volume of the right pleural effusion. A small effusion remains. No pneumothorax. 2. Persistently increased pulmonary interstitial markings in the setting of cardiomegaly consistent with CHF. Areas of patchy confluent density in the left mid and lower lung persist and may reflect focal areas of atelectasis, alveolar edema, or pneumonia.   Electronically Signed   By: David  Martinique M.D.   On: 10/16/2014 07:58   Ir Perc Pleural Drain W/indwell Cath W/img Guide  10/15/2014   CLINICAL DATA:  History of severe congestive heart failure with recurrent symptomatic right-sided pleural effusion. Please place right-sided tunneled pleural drainage catheter for palliative purposes.  EXAM: INSERTION OF TUNNELED RIGHT SIDED PLEURAL DRAINAGE CATHETER  COMPARISON:  Ultrasound-guided thoracentesis - 10/11/2014; 09/06/2014; chest radiograph - 10/12/2014; 09/08/2014; chest CT - 09/07/2014  MEDICATIONS: Ancef 1 gram IV; Antibiotic was administered in an appropriate time interval for the procedure.  ANESTHESIA/SEDATION: Versed 1 mg IV; Fentanyl 25 mcg IV  Total Moderate Sedation Time  20 minutes.  FLUOROSCOPY TIME:  FLUOROSCOPY TIME 42 seconds (12.2 mGy)  COMPLICATIONS: None immediate  PROCEDURE: The procedure, risks, benefits, and alternatives were explained to the patient, who wish to proceed with the placement of this permanent pleural catheter as the patient is seeking palliative care. The patient understand and consent to the  procedure.  The right lateral chest and upper abdomen were prepped with Chlorhexidine in a sterile fashion, and a sterile drape was applied covering the operative field. A sterile gown and sterile gloves were used for the procedure. Initial ultrasound scanning and fluoroscopic imaging demonstrates a recurrent moderate to large pleural effusion.  Under direct ultrasound guidance, the right inferior lateral pleural space was accessed with a Yueh sheath needle after the overlying soft tissues were anesthetized with 1% lidocaine with epinephrine. An Amplatz Sebastiani stiff wire was then advanced under fluoroscopy into the pleural space.  A 15.5 French tunneled Pleur-X catheter was tunneled from an incision within the right upper abdominal quadrant to the access site. The pleural access site was serially dilated under fluoroscopy, ultimately allowing placement of a peel-away sheath. The catheter was advanced through the peel-away sheath. The sheath was then removed. Final catheter positioning was confirmed with a fluoroscopic radiographic image.  The access incision was closed with subcutaneous subcuticular 4-0 Vicryl, Dermabond and Steri-Strips. A Prolene retention suture was applied at the catheter exit site. Large volume thoracentesis was performed through the new catheter utilizing provided bulb vacuum assisted drainage bag. The patient tolerated the above procedure well without immediate postprocedural complication.  FINDINGS: Preprocedural ultrasound scanning demonstrates a recurrent large sized right sided pleural effusion.  After ultrasound and fluoroscopic guided placement, the catheter is coiled within the caudal aspect of the right pleural space.  Following catheter placement, approximately 1.8 L of serous, slightly brown colored pleural fluid was removed.  IMPRESSION: Successful placement of permanent, tunneled right pleural drainage catheter via lateral approach. 1.8 liters of serous, slightly brown colored  pleural fluid was removed after catheter placement.   Electronically Signed   By: Eldridge Abrahams.D.  On: 10/15/2014 16:45   US Thoracentesis Asp Pleural Space W/img Guide  10/11/2014   INDICATION: Symptomatic right sided pleural effusion  EXAM: US THORACENTESIS ASP PLEURAL SPACE W/IMG GUIDE  COMPARISON:  CXR 10/11/14.  MEDICATIONS: None  COMPLICATIONS: None immediate  TECHNIQUE: Informed written consent was obtained from the patient after a discussion of the risks, benefits and alternatives to treatment. A timeout was performed prior to the initiation of the procedure.  Initial ultrasound scanning demonstrates a right pleural effusion. The lower chest was prepped and draped in the usual sterile fashion. 1% lidocaine was used for local anesthesia.  Under direct ultrasound guidance, a 19 gauge, 7-cm, Yueh catheter was introduced. An ultrasound image was saved for documentation purposes. The thoracentesis was performed. The catheter was removed and a dressing was applied. The patient tolerated the procedure well without immediate post procedural complication. The patient was escorted to have an upright chest radiograph.  FINDINGS: A total of approximately 1.4 liters of amber colored fluid was removed. Requested samples were sent to the laboratory.  IMPRESSION: Successful ultrasound-guided right sided thoracentesis yielding 1.4 liters of pleural fluid.  Read By:  Tsosie Billing PA-C   Electronically Signed   By: Lucrezia Europe M.D.   On: 10/11/2014 10:30   Microbiology: Recent Results (from the past 240 hour(s))  Blood culture (routine x 2)     Status: None   Collection Time: 10/10/14  9:53 PM  Result Value Ref Range Status   Specimen Description BLOOD RIGHT ANTECUBITAL  Final   Special Requests BOTTLES DRAWN AEROBIC AND ANAEROBIC 6CC  Final   Culture NO GROWTH 5 DAYS  Final   Report Status 10/15/2014 FINAL  Final  Blood culture (routine x 2)     Status: None   Collection Time: 10/10/14 10:00 PM  Result Value  Ref Range Status   Specimen Description BLOOD RIGHT ARM  Final   Special Requests BOTTLES DRAWN AEROBIC ONLY 6CC  Final   Culture NO GROWTH 5 DAYS  Final   Report Status 10/15/2014 FINAL  Final  MRSA PCR Screening     Status: None   Collection Time: 10/11/14  4:16 AM  Result Value Ref Range Status   MRSA by PCR NEGATIVE NEGATIVE Final    Comment:        The GeneXpert MRSA Assay (FDA approved for NASAL specimens only), is one component of a comprehensive MRSA colonization surveillance program. It is not intended to diagnose MRSA infection nor to guide or monitor treatment for MRSA infections.   Culture, body fluid-bottle     Status: None   Collection Time: 10/11/14  9:57 AM  Result Value Ref Range Status   Specimen Description FLUID RIGHT PLEURAL  Final   Special Requests BAA 10CCS  Final   Culture NO GROWTH 5 DAYS  Final   Report Status 10/16/2014 FINAL  Final  Gram stain     Status: None   Collection Time: 10/11/14  9:57 AM  Result Value Ref Range Status   Specimen Description FLUID RIGHT PLEURAL  Final   Special Requests NONE  Final   Gram Stain   Final    MODERATE WBC PRESENT, PREDOMINANTLY PMN NO ORGANISMS SEEN    Report Status 10/11/2014 FINAL  Final    Labs: Basic Metabolic Panel:  Recent Labs Lab 10/13/14 0440 10/14/14 0540 10/15/14 0408 10/16/14 0328 10/17/14 0252 10/18/14 0426  NA 139 135 136 135 138 133*  K 4.5 4.4 4.5 4.3 4.6 4.7  CL 98* 92*  92* 89* 92* 88*  CO2 35* 34* 36* 37* 37* 37*  GLUCOSE 59* 97 130* 111* 156* 174*  BUN 36* 32* 33* 31* 36* 32*  CREATININE 1.39* 1.26* 1.20* 1.16* 1.10* 1.02*  CALCIUM 8.7* 8.5* 8.6* 8.4* 8.7* 8.4*   CBC:  Recent Labs Lab 10/12/14 1709 10/13/14 0440 10/14/14 0540 10/15/14 0408 10/16/14 0328  WBC  --  10.2 11.5* 13.5* 9.9  HGB 8.3* 9.0* 9.0* 9.4* 9.0*  HCT 27.4* 28.5* 28.9* 29.9* 28.7*  MCV  --  89.6 89.8 90.1 89.4  PLT  --  202 235 267 269   Cardiac Enzymes:  Recent Labs Lab 10/12/14 1709  10/12/14 2138 10/13/14 0440  TROPONINI 0.04* 0.08* 0.04*   BNP: BNP (last 3 results)  Recent Labs  09/09/14 0245 10/10/14 2153  BNP 436.8* 914.0*   CBG:  Recent Labs Lab 10/18/14 0817 10/18/14 1201 10/18/14 1727 10/18/14 2006 10/19/14 0746  GLUCAP 125* 315* 279* 269* 165*    Signed:  Marzetta Board  [facility] 10/19/2014, 9:47 AM

## 2014-10-19 NOTE — Care Management Important Message (Signed)
Important Message  Patient Details  Name: [name] [name] MRN: [id number] Date of Birth: April 07, 1945   Medicare Important Message Given:  Yes-third notification given    Nathen May 10/19/2014, 11:43 AM

## 2014-10-19 NOTE — Progress Notes (Signed)
Inpatient Diabetes Program Recommendations  AACE/ADA: New Consensus Statement on Inpatient Glycemic Control (2015)  Target Ranges:  Prepandial:   less than 140 mg/dL      Peak postprandial:   less than 180 mg/dL (1-2 hours)      Critically ill patients:  140 - 180 mg/dL   Review of Glycemic Control  Diabetes history: DM 2 Outpatient Diabetes medications: Amaryl 16m BID, Levemir 22 units, Novolog 0-8 units TID Current orders for Inpatient glycemic control: Levemir 7 units QHS, Novolog Sensitive  Inpatient Diabetes Program Recommendations: Insulin - Meal Coverage: Patient's glucose increased to 315 around meal times yesterday. If unable to discharge today, please consider ordering Novolog 5 units meal coverage TID.   Thanks,  STama HeadingsRN, MSN, PIngalls Same Day [facility] Center Ltd PtrInpatient Diabetes Coordinator Team Pager [phone](8a-5p)

## 2014-10-19 NOTE — Clinical Social Work Placement (Signed)
   CLINICAL SOCIAL WORK PLACEMENT  NOTE  Date:  10/19/2014  Patient Details  Name: [name] [name] MRN: [id number] Date of Birth: 05/21/1945  Clinical Social Work is seeking post-discharge placement for this patient at the [place] level of care (*CSW will initial, date and re-position this form in  chart as items are completed):  Yes   Patient/family provided with [facility] Work Department's list of facilities offering this level of care within the geographic area requested by the patient (or if unable, by the patient's family).  Yes   Patient/family informed of their freedom to choose among providers that offer the needed level of care, that participate in Medicare, Medicaid or managed care program needed by the patient, have an available bed and are willing to accept the patient.  Yes   Patient/family informed of [facility]'s ownership interest in [facility] and [facility], as well as of the fact that they are under no obligation to receive care at these facilities.  PASRR submitted to EDS on 10/17/14     PASRR number received on       Existing PASRR number confirmed on       FL2 transmitted to all facilities in geographic area requested by pt/family on 10/17/14     FL2 transmitted to all facilities within larger geographic area on       Patient informed that his/her managed care company has contracts with or will negotiate with certain facilities, including the following:        Yes   Patient/family informed of bed offers received.  Patient chooses bed at [facility]     Physician recommends and patient chooses bed at      Patient to be transferred to [facility] on 10/19/14.  Patient to be transferred to facility by Ambulance     Patient family notified on 10/19/14 of transfer.  Name of family member notified:  Vaughan Basta     PHYSICIAN Please sign FL2     Additional Comment:  Per MD, patient ready to discharge to  [facility] [place]. RN, patient/family, and facility notified of patient's discharge. RN given number for report. DC packet on chart. Ambulance transport requested. Family has completed necessary paperwork. BSW intern signing off.   Raynelle Highland BSW Intern, [phone]    _______________________________________________ Raynelle Highland, Student-SW 10/19/2014, 12:44 PM

## 2014-10-19 NOTE — Care Management Note (Signed)
Case Management Note  Patient Details  Name: [name] [name] MRN: [id number] Date of Birth: [birth year]  Subjective/Objective:     Acute respiratory failure with hypoxia                Action/Plan: Notified [facility] of pt's scheduled dc to SNF.   Expected Discharge Date:    10/19/2014             Expected Discharge Plan:  [place]  In-House Referral:  NA  Discharge planning Services  CM Consult      Status of Service:  Completed, signed off  Medicare Important Message Given:  Yes-third notification given Date Medicare IM Given:    Medicare IM give by:    Date Additional Medicare IM Given:    Additional Medicare Important Message give by:     If discussed at [place] of Stay Meetings, dates discussed:    Additional Comments:  Erenest Rasher, RN 10/19/2014, 11:27 AM

## 2014-10-19 NOTE — Care Management Important Message (Signed)
Important Message  Patient Details  Name: [name] [name] MRN: [id number] Date of Birth: October 18, 1945   Medicare Important Message Given:  Yes-third notification given    Erenest Rasher, RN 10/19/2014, 10:42 AM

## 2014-10-19 NOTE — Progress Notes (Signed)
Right chest Pleurex drained and yielded 700 cc of serosanguinous pleural fluid, the flow was easy and uninterrupted. Pt tolerated the procedure well. Minimal pain reported by pt toward the end of the procedure. Will continue to monitor.  Ferdinand Lango, RN

## 2014-11-06 ENCOUNTER — Other Ambulatory Visit: Payer: Self-pay | Admitting: *Deleted

## 2014-11-06 DIAGNOSIS — Z48812 Encounter for surgical aftercare following surgery on the circulatory system: Secondary | ICD-10-CM

## 2014-11-06 DIAGNOSIS — I739 Peripheral vascular disease, unspecified: Secondary | ICD-10-CM

## 2014-11-09 ENCOUNTER — Encounter: Payer: Self-pay | Admitting: Vascular Surgery

## 2014-11-11 ENCOUNTER — Ambulatory Visit (INDEPENDENT_AMBULATORY_CARE_PROVIDER_SITE_OTHER)
Admission: RE | Admit: 2014-11-11 | Discharge: 2014-11-11 | Disposition: A | Discharge status: Home / Self Care | Payer: Medicare Other | Source: Ambulatory Visit | Attending: Vascular Surgery | Admitting: Vascular Surgery

## 2014-11-11 ENCOUNTER — Encounter: Payer: Self-pay | Admitting: Vascular Surgery

## 2014-11-11 ENCOUNTER — Ambulatory Visit (INDEPENDENT_AMBULATORY_CARE_PROVIDER_SITE_OTHER): Payer: Medicare Other | Admitting: Vascular Surgery

## 2014-11-11 ENCOUNTER — Ambulatory Visit (HOSPITAL_COMMUNITY)
Admission: RE | Admit: 2014-11-11 | Discharge: 2014-11-11 | Disposition: A | Discharge status: Home / Self Care | Payer: Medicare Other | Source: Ambulatory Visit | Attending: Vascular Surgery | Admitting: Vascular Surgery

## 2014-11-11 ENCOUNTER — Other Ambulatory Visit: Payer: Self-pay | Admitting: Vascular Surgery

## 2014-11-11 ENCOUNTER — Telehealth: Payer: Self-pay | Admitting: Vascular Surgery

## 2014-11-11 VITALS — BP 154/68 | HR 48 | Ht 66.0 in | Wt 173.9 lb

## 2014-11-11 DIAGNOSIS — Z48812 Encounter for surgical aftercare following surgery on the circulatory system: Secondary | ICD-10-CM

## 2014-11-11 DIAGNOSIS — I70245 Atherosclerosis of native arteries of left leg with ulceration of other part of foot: Secondary | ICD-10-CM | POA: Diagnosis not present

## 2014-11-11 DIAGNOSIS — I70735 Atherosclerosis of other type of bypass graft(s) of the right leg with ulceration of other part of foot: Secondary | ICD-10-CM | POA: Diagnosis not present

## 2014-11-11 DIAGNOSIS — I739 Peripheral vascular disease, unspecified: Secondary | ICD-10-CM | POA: Insufficient documentation

## 2014-11-11 DIAGNOSIS — I6523 Occlusion and stenosis of bilateral carotid arteries: Secondary | ICD-10-CM | POA: Diagnosis not present

## 2014-11-11 NOTE — Telephone Encounter (Signed)
LVM for [name] [name] - appt on 11/4 with dr. Domenic Polite already scheduled for pt

## 2014-11-11 NOTE — Progress Notes (Signed)
Vascular and Vein Specialist of [facility]  Patient name: [name] [name] MRN: [id number] DOB: 09-24-1945 Sex: female  REASON FOR VISIT: Follow up after left femoropopliteal bypass  HPI: [name] [name] is a 69 y.o. female who underwent a left common femoral artery to below knee popliteal artery bypass and popliteal artery endarterectomy in September 2015. She comes in for a routine follow up visit. I last saw her on 05/13/14. Since that time, she has developed a wound on her right second toe which she states she has had for about 2 months. She tells me that the wound is improving. In addition, she has been hospitalized with pneumonia. She is recovering from this.  I do not get any clear-cut history of claudication on the right leg although her activities fairly limited. She denies rest pain. She denies fever or chills.  She has a complicated past medical history as documented above including the use of home O2 and ischemic cardiomyopathy.  Past Medical History  Diagnosis Date  . Mixed hyperlipidemia   . Essential hypertension, benign   . Depression   . Cellulitis     a. Recurrent, bilateral legs  . Carotid artery disease (HCC)     a. Bilateral CEA; 50-60% bilateral ICA stenosis, 11/12  . PAD (peripheral artery disease) ([place])   . Suicide attempt ([place]) 08/2002  . TIA (transient ischemic attack)   . TMJ syndrome   . Herniated disc   . Ischemic cardiomyopathy     a. 11/2010 TEE: EF 40-45%.  . Critical lower limb ischemia   . Venous insufficiency   . Coronary atherosclerosis of native coronary artery     a. 11/2010 NSTEMI/CABG x 4: L-LAD; S-PL; S-OM; S-DX, by PVT.  [name] NSTEMI (non-ST elevated myocardial infarction) ([place]) 11/2010  . Pneumonia 2000's X 2  . Type 2 diabetes mellitus ([place])   . History of blood transfusion   . Migraine   . Stroke ([place]) 08/2009  . Arthritis   . Chronic back pain   . On home oxygen therapy   . Chronic diastolic heart failure (HCC)     Family History    Problem Relation Age of Onset  . Coronary artery disease Father     Died with MI age 61  . Heart disease Father     before age 68  . Heart attack Father   . Diabetes type II Mother   . Hypertension Mother   . Diabetes Mother   . Heart disease Mother   . Hyperlipidemia Mother   . Heart failure Sister   . Cancer Sister   . Cancer Brother     Lung cancer  . Diabetes Daughter   . Hyperlipidemia Daughter   . Diabetes Son   . Hyperlipidemia Son     SOCIAL HISTORY: Social History  Substance Use Topics  . Smoking status: Former Smoker -- 3.00 packs/day for 50 years    Types: Cigarettes    Start date: 01/24/1956    Quit date: 10/09/2009  . Smokeless tobacco: Never Used  . Alcohol Use: 0.0 oz/week    0 Standard drinks or equivalent per week     Comment: 10/06/2013 "might have a drink q 2-3 months"    Allergies  Allergen Reactions  . Other Shortness Of Breath, Rash and Other (See Comments)    All berries   . Strawberry Extract Hives, Shortness Of Breath and Rash  . Sulfa Antibiotics Swelling    Bodily Swelling  . Tape Other (See Comments)  Tears skin.  Please use "paper" tape only.    Current Outpatient Prescriptions  Medication Sig Dispense Refill  . acetaminophen (TYLENOL) 500 MG tablet Take 1,000 mg by mouth every 6 (six) hours as needed.    [name] albuterol (PROVENTIL HFA;VENTOLIN HFA) 108 (90 BASE) MCG/ACT inhaler Inhale 2 puffs into the lungs daily as needed for wheezing or shortness of breath.     [name] amLODipine (NORVASC) 10 MG tablet Take 1 tablet (10 mg total) by mouth daily. 90 tablet 3  . aspirin 81 MG EC tablet Take 81 mg by mouth daily.    [name] atorvastatin (LIPITOR) 40 MG tablet Take 40 mg by mouth daily at 6 PM.    . carvedilol (COREG) 25 MG tablet     . citalopram (CELEXA) 20 MG tablet Take 20 mg by mouth daily.    . clopidogrel (PLAVIX) 75 MG tablet Take 1 tablet (75 mg total) by mouth daily with breakfast. 30 tablet 9  . Docusate Calcium (STOOL SOFTENER PO) Take  100 mg by mouth daily as needed (for constipation).     . ferrous sulfate 325 (65 FE) MG tablet Take 1 tablet (325 mg total) by mouth daily with breakfast.  3  . gabapentin (NEURONTIN) 300 MG capsule Take 300 mg by mouth daily.    [name] glimepiride (AMARYL) 2 MG tablet     . hydrALAZINE (APRESOLINE) 25 MG tablet Take 25 mg by mouth 3 (three) times daily.    [name] HYDROcodone-acetaminophen (NORCO/VICODIN) 5-325 MG tablet Take 1 tablet by mouth every 6 (six) hours as needed for moderate pain. 30 tablet 0  . insulin aspart (NOVOLOG) 100 UNIT/ML injection Inject 3 Units into the skin 3 (three) times daily with meals. 10 mL 11  . insulin detemir (LEVEMIR) 100 UNIT/ML injection Inject 0.07 mLs (7 Units total) into the skin at bedtime. 10 mL 11  . isosorbide mononitrate (IMDUR) 30 MG 24 hr tablet Take 1.5 tablets (45 mg total) by mouth daily.    [name] lisinopril (PRINIVIL,ZESTRIL) 20 MG tablet Take 1 tablet (20 mg total) by mouth daily. 90 tablet 3  . Multiple Vitamin (MULTIVITAMIN WITH MINERALS) TABS tablet Take 1 tablet by mouth daily.    . nitroGLYCERIN (NITROSTAT) 0.4 MG SL tablet Place 1 tablet (0.4 mg total) under the tongue every 5 (five) minutes as needed for chest pain. Up to 3 doses. If no relief after 3rd dose, proceed to the ED for an evaluation 25 tablet 3  . potassium chloride (K-DUR) 10 MEQ tablet Take 1 tablet (10 mEq total) by mouth daily. 90 tablet 3  . torsemide (DEMADEX) 20 MG tablet     . Umeclidinium-Vilanterol 62.5-25 MCG/INH AEPB Inhale 1 application into the lungs daily.     . furosemide (LASIX) 80 MG tablet Take 1 tablet (80 mg total) by mouth 2 (two) times daily. (Patient not taking: Reported on 11/11/2014)     No current facility-administered medications for this visit.    REVIEW OF SYSTEMS:  _0  denotes positive finding, _1  denotes negative finding Cardiac  Comments:  Chest pain or chest pressure:    Shortness of breath upon exertion: X   Short of breath when lying flat:      Irregular heart rhythm:        Vascular    Pain in calf, thigh, or hip brought on by ambulation:    Pain in feet at night that wakes you up from your sleep:     Blood clot in your  veins:    Leg swelling:         Pulmonary    Oxygen at home: X   Productive cough:     Wheezing:         Neurologic    Sudden weakness in arms or legs:     Sudden numbness in arms or legs:     Sudden onset of difficulty speaking or slurred speech:    Temporary loss of vision in one eye:     Problems with dizziness:         Gastrointestinal    Blood in stool:     Vomited blood:         Genitourinary    Burning when urinating:     Blood in urine:        Psychiatric    Major depression:  x Remote history      Hematologic    Bleeding problems:    Problems with blood clotting too easily:        Skin    Rashes or ulcers: X Right second toe      Constitutional    Fever or chills:      PHYSICAL EXAM: Filed Vitals:   11/11/14 1042 11/11/14 1044  BP: 150/64 154/68  Pulse: 48   Height: _0  (1.676 m)   Weight: 173 lb 14.4 oz (78.881 kg)   SpO2: 93%     GENERAL: The patient is a well-nourished female, in no acute distress. The vital signs are documented above. CARDIAC: There is a regular rate and rhythm.  VASCULAR: she has soft bilateral carotid bruits. She has palpable femoral pulses. I cannot palpate pedal pulses. PULMONARY: There is good air exchange bilaterally without wheezing or rales. ABDOMEN: Soft and non-tender with normal pitched bowel sounds.  MUSCULOSKELETAL: There are no major deformities or cyanosis. NEUROLOGIC: No focal weakness or paresthesias are detected. SKIN: She has an open wound on her right second toe with mild cellulitis. He has hyperpigmentation bilaterally consistent with chronic venous insufficiency. PSYCHIATRIC: The patient has a normal affect.  DATA:  I have independently interpreted her duplex of her graft today which does show elevated velocities in the  proximal graft with a peak systolic velocity of 034 cm/s. There is biphasic and triphasic flow below that. I have compared this duplex to the study on 05/13/2014 which showed that the velocities at the proximal anastomosis were 519 cm/s. Thus the velocities have improved slightly.  I have independently interpreted the arterial Doppler study which shows triphasic Doppler signals in the left posterior tibial and dorsalis pedis positions with an ABI of 100%. On the right side there are monophasic Doppler signals in the dorsalis pedis and posterior tibial positions with an ABI of 58%.  MEDICAL ISSUES:  PERIPHERAL VASCULAR DISEASE WITH ULCERATION: Based on her Doppler study, she has evidence of significant infrainguinal arterial occlusive disease on the right. ABI on the right is 58% with a toe pressure of 33 mmHg. She has monophasic Doppler signals in the right foot. Given the nonhealing wound her diabetes and think this certainly could be coming limb threatened situation. I do not think she has adequate circulation to heal a toe amputation. I think she would benefit from an arteriogram and will arrange for her to see Dr. Quay Burow who I believe is studied her before.  With respect to her left femoropopliteal bypass, she does have elevated velocities in the proximal graft however she has a normal ABI and triphasic signals throughout  the graft. However think it would be worth looking at this at the time of her arteriogram also to further assess this. If she has tight stenosis there I may need to consider patch angioplasty to lower her risk of graft thrombosis although currently she has a normal ABI and normal signals throughout her graft.   CAROTID BRUITS: She does have bilateral carotid bruits. Her carotid disease is followed by Dr. Quay Burow. Her last duplex was in March 2016. She had bilateral 50-69% carotid stenoses.  Deitra Mayo [facility] of [place]:  [phone]

## 2014-11-13 ENCOUNTER — Ambulatory Visit (INDEPENDENT_AMBULATORY_CARE_PROVIDER_SITE_OTHER): Payer: Medicare Other | Admitting: Cardiology

## 2014-11-13 ENCOUNTER — Encounter: Payer: Self-pay | Admitting: Cardiology

## 2014-11-13 VITALS — BP 117/57 | HR 48 | Ht 66.0 in | Wt 182.0 lb

## 2014-11-13 DIAGNOSIS — I5032 Chronic diastolic (congestive) heart failure: Secondary | ICD-10-CM

## 2014-11-13 DIAGNOSIS — I739 Peripheral vascular disease, unspecified: Secondary | ICD-10-CM | POA: Diagnosis not present

## 2014-11-13 DIAGNOSIS — J90 Pleural effusion, not elsewhere classified: Secondary | ICD-10-CM

## 2014-11-13 DIAGNOSIS — I2510 Atherosclerotic heart disease of native coronary artery without angina pectoris: Secondary | ICD-10-CM

## 2014-11-13 DIAGNOSIS — I6523 Occlusion and stenosis of bilateral carotid arteries: Secondary | ICD-10-CM | POA: Diagnosis not present

## 2014-11-13 NOTE — Progress Notes (Signed)
Cardiology Office Note  Date: 11/13/2014   ID: [name] [name], DOB 1945-03-07, MRN [id number]  PCP: Glenda Chroman., MD  Primary Cardiologist: Rozann Lesches, MD   Chief Complaint  Patient presents with  . Coronary Artery Disease  . Cardiomyopathy    History of Present Illness: [name] [name] is a medically complex 69 y.o. female last seen in September. Record review finds hospitalization in October with recurrent respiratory failure associated with volume overload and recurrent right pleural effusion. She underwent thoracentesis and placement of a Pleurx catheter, no malignancy identified on cytology. She was seen by cardiology and also treated with diuretics, net loss of 19 L.  She is here today with her daughter. She has been home from the [facility] since this past weekend. Still doing physical therapy at home a few days a week. Although her weight is up by our scales, she states that when she got home her weight was 174 pounds on her own scale, and has stayed there each day. She reports compliance with Demadex 40 mg in the morning and 20 mg in the afternoon. Pleurx catheter still draining 300-400 cc each day. She has follow-up for this with Dr. Luan Pulling.  She does not report any angina symptoms at this time.we reviewed her medications, and her anginal regimen has been stable. Heart rate is in the 50s to 60s on Coreg. Blood pressure is normal today.  She continues to follow with Dr. Scot Dock for vascular evaluation, known PAD and carotid disease. She also has pending follow-up to see Dr. Gwenlyn Found. Recent ABIs were 100% on the left and 58% on the right. She has bilateral ICA disease in the 50-69% range.  Past Medical History  Diagnosis Date  . Mixed hyperlipidemia   . Essential hypertension, benign   . Depression   . Cellulitis     a. Recurrent, bilateral legs  . Carotid artery disease (HCC)     a. Bilateral CEA; 50-60% bilateral ICA stenosis, 11/12  . PAD (peripheral artery disease)  ([place])   . Suicide attempt ([place]) 08/2002  . TIA (transient ischemic attack)   . TMJ syndrome   . Herniated disc   . Ischemic cardiomyopathy     a. 11/2010 TEE: EF 40-45%.  . Critical lower limb ischemia   . Venous insufficiency   . Coronary atherosclerosis of native coronary artery     a. 11/2010 NSTEMI/CABG x 4: L-LAD; S-PL; S-OM; S-DX, by PVT.  [name] NSTEMI (non-ST elevated myocardial infarction) ([place]) 11/2010  . Pneumonia 2000's X 2  . Type 2 diabetes mellitus ([place])   . History of blood transfusion   . Migraine   . Stroke ([place]) 08/2009  . Arthritis   . Chronic back pain   . On home oxygen therapy   . Chronic diastolic heart failure [facility])     Past Surgical History  Procedure Laterality Date  . Appendectomy    . Cholecystectomy    . Abdominal hysterectomy    . Carotid endarterectomy Bilateral 2011    Bilateral - Dr. Kellie Simmering  . Wrist fracture surgery Left     "grafted bone from hip to wrist"  . Coronary artery bypass graft  11/24/2010    Procedure: CORONARY ARTERY BYPASS GRAFTING (CABG);  Surgeon: Tharon Aquas Adelene Idler, MD;  Location: [place];  Service: Open Heart Surgery;  Laterality: N/A;  Coronary Artery Bypass Graft times four on pump utilizing left internal mammary artery and bilateral greater saphenous veins harvested endoscopically, transesophageal echocardiogram   .  Toe surgery Left     "put pin in 2nd toe"  . Lower extremity angiogram  09/08/2012; 10/06/2013    "found 100% blockage; unsuccessful attempt at crossing a chronic total occlusion of the left SFA in the setting of critical limb ischemia  . Dilation and curettage of uterus    . Tubal ligation    . Debridement toe Left     "nonhealing wound; 3rd digit  . Cardiac catheterization  11/2010  . Coronary angioplasty with stent placement  03/2013    "1"  . Colonoscopy w/ biopsies and polypectomy    . Fracture surgery    . Femoral-popliteal bypass graft Left 10/07/2013    Procedure: LEFT FEMORAL-POPLITEAL ARTERY BYPASS GRAFT;   Surgeon: Angelia Mould, MD;  Location: [place];  Service: Vascular;  Laterality: Left;  . Intraoperative arteriogram Left 10/07/2013    Procedure: INTRA OPERATIVE ARTERIOGRAM LEFT LEG;  Surgeon: Angelia Mould, MD;  Location: [place];  Service: Vascular;  Laterality: Left;  . Endarterectomy popliteal Left 10/07/2013    Procedure: LEFT POPLITEAL ENDARTERECTOMY ;  Surgeon: Angelia Mould, MD;  Location: [place];  Service: Vascular;  Laterality: Left;  . Left and right heart catheterization with coronary/graft angiogram N/A 03/27/2013    Procedure: LEFT AND RIGHT HEART CATHETERIZATION WITH Beatrix Fetters;  Surgeon: Burnell Blanks, MD;  Location: [facility] C[facility] LAB;  Service: Cardiovascular;  Laterality: N/A;  . Lower extremity angiogram Bilateral 09/08/2013    Procedure: LOWER EXTREMITY ANGIOGRAM;  Surgeon: [name], MD;  Location: [facility] CATH LAB;  Service: Cardiovascular;  Laterality: Bilateral;    Current Outpatient Prescriptions  Medication Sig Dispense Refill  . acetaminophen (TYLENOL) 500 MG tablet Take 1,000 mg by mouth every 6 (six) hours as needed.    [name] albuterol (PROVENTIL HFA;VENTOLIN HFA) 108 (90 BASE) MCG/ACT inhaler Inhale 2 puffs into the lungs daily as needed for wheezing or shortness of breath.     [name] amLODipine (NORVASC) 10 MG tablet Take 1 tablet (10 mg total) by mouth daily. 90 tablet 3  . aspirin 81 MG EC tablet Take 81 mg by mouth daily.    [name] atorvastatin (LIPITOR) 40 MG tablet Take 40 mg by mouth daily at 6 PM.    . carvedilol (COREG) 25 MG tablet     . citalopram (CELEXA) 20 MG tablet Take 20 mg by mouth daily.    . clopidogrel (PLAVIX) 75 MG tablet Take 1 tablet (75 mg total) by mouth daily with breakfast. 30 tablet 9  . Docusate Calcium (STOOL SOFTENER PO) Take 100 mg by mouth daily as needed (for constipation).     . gabapentin (NEURONTIN) 300 MG capsule Take 300 mg by mouth daily.    [name] glimepiride (AMARYL) 2 MG tablet     . hydrALAZINE  (APRESOLINE) 25 MG tablet Take 25 mg by mouth 3 (three) times daily.    [name] HYDROcodone-acetaminophen (NORCO/VICODIN) 5-325 MG tablet Take 1 tablet by mouth every 6 (six) hours as needed for moderate pain. 30 tablet 0  . insulin aspart (NOVOLOG) 100 UNIT/ML injection Inject 3 Units into the skin 3 (three) times daily with meals. 10 mL 11  . insulin detemir (LEVEMIR) 100 UNIT/ML injection Inject 0.07 mLs (7 Units total) into the skin at bedtime. 10 mL 11  . isosorbide mononitrate (IMDUR) 30 MG 24 hr tablet Take 1.5 tablets (45 mg total) by mouth daily.    [name] lisinopril (PRINIVIL,ZESTRIL) 20 MG tablet Take 1 tablet (20 mg total) by mouth  daily. 90 tablet 3  . Multiple Vitamin (MULTIVITAMIN WITH MINERALS) TABS tablet Take 1 tablet by mouth daily.    . nitroGLYCERIN (NITROSTAT) 0.4 MG SL tablet Place 1 tablet (0.4 mg total) under the tongue every 5 (five) minutes as needed for chest pain. Up to 3 doses. If no relief after 3rd dose, proceed to the ED for an evaluation 25 tablet 3  . potassium chloride (K-DUR) 10 MEQ tablet Take 1 tablet (10 mEq total) by mouth daily. 90 tablet 3  . torsemide (DEMADEX) 20 MG tablet Take 20 mg by mouth. 2 tabs by mouth every morning & 1 tab at noon    . Umeclidinium-Vilanterol 62.5-25 MCG/INH AEPB Inhale 1 application into the lungs daily.      No current facility-administered medications for this visit.    Allergies:  Other; Strawberry extract; Sulfa antibiotics; and Tape   Social History: The patient  reports that she quit smoking about 5 years ago. Her smoking use included Cigarettes. She started smoking about 58 years ago. She has a 150 pack-year smoking history. She has never used smokeless tobacco. She reports that she drinks alcohol. She reports that she uses illicit drugs (Marijuana).   ROS:  Please see the history of present illness. Otherwise, complete review of systems is positive for chronic fatigue, NYHA class 3 dyspnea.  All other systems are reviewed and  negative.   Physical Exam: VS:  BP 117/57 mmHg  Pulse 48  Ht _0  (1.676 m)  Wt 182 lb (82.555 kg)  BMI 29.39 kg/m2  SpO2 99%, BMI Body mass index is 29.39 kg/(m^2).  Wt Readings from Last 3 Encounters:  11/13/14 182 lb (82.555 kg)  11/11/14 173 lb 14.4 oz (78.881 kg)  10/19/14 177 lb 9.6 oz (80.559 kg)     Chronically ill-appearing woman, no distress. Wearing oxygen. HEENT: Conjunctiva and lids normal, oropharynx clear. Neck: Supple, no elevated JVP, bilateral CEA scars, no thyromegaly. Lungs: Clear to auscultation with decreased breath sounds at the right lower zone, Pleurx catheter in place,nonlabored breathing at rest. Cardiac: Regular rate and rhythm, no S3, soft systolic murmur, no pericardial rub. Abdomen: Soft, nontender, protuberant, bowel sounds present, no guarding or rebound. Extremities: 1+ edema, improved, distal pulses 1+. Skin: Warm and dry. Drying and flaking noted mid to distal legs. Musculoskeletal: No kyphosis. Neuropsychiatric: Alert and oriented x3, affect grossly appropriate.   ECG: Tracing from 10/12/2014 showed sinus bradycardia with IVCD and nonspecific T-wave changes.   Recent Labwork: 10/10/2014: B Natriuretic Peptide 914.0* 10/12/2014: ALT 23; AST 24; Magnesium 2.1; TSH 2.210 10/16/2014: Hemoglobin 9.0*; Platelets 269 10/18/2014: BUN 32*; Creatinine, Ser 1.02*; Potassium 4.7; Sodium 133*   Other Studies Reviewed Today:  Echocardiogram 09/09/2014: Study Conclusions  - Left ventricle: The cavity size was normal. Wall thickness was normal. Systolic function was normal. The estimated ejection fraction was in the range of 50% to 55%. Wall motion was normal; there were no regional wall motion abnormalities. - Left atrium: The atrium was mildly dilated.   Assessment and Plan:  1. Chronic diastolic heart failure. Weight is in the mid 170s by home scale, and has reportedly been stable over the last week. I have recommended continuation of  Demadex at 40 mg in the morning and 20 mg in the afternoon, although if her weight increases by 2-3 pounds in 24 hours, she is to take an additional Demadex tablet in the afternoon. Follow-up arranged with BMET.  2. Recurrent right pleural effusion status post Pleurx catheter. Still reportedly  draining 300-400 cc. She has follow with Dr. Luan Pulling.  3. Ischemic heart disease status post CABG, no active angina symptoms. Continue medical therapy.  4. Bilateral carotid artery disease status post CEA.  5. PAD, right worse than left. She follows with Dr. Scot Dock and Dr. Gwenlyn Found.  Current medicines were reviewed with the patient today.   Orders Placed This Encounter  Procedures  . Basic metabolic panel    Disposition: FU with me in 3 weeks.   Signed, Satira Sark, MD, [facility] 11/13/2014 3:39 PM    [facility] [place], [place], [place] [address] Phone: [phone]; Fax: [phone]

## 2014-11-13 NOTE — Patient Instructions (Signed)
Your physician recommends that you continue on your current medications as directed. Please refer to the Current Medication list given to you today. Your physician recommends that you return for lab work in: 3 weeks to check your BMET. Your physician recommends that you schedule a follow-up appointment in: 3-4 weeks.

## 2014-11-18 DIAGNOSIS — Z87891 Personal history of nicotine dependence: Secondary | ICD-10-CM | POA: Diagnosis not present

## 2014-11-18 DIAGNOSIS — F329 Major depressive disorder, single episode, unspecified: Secondary | ICD-10-CM | POA: Diagnosis not present

## 2014-11-18 DIAGNOSIS — I110 Hypertensive heart disease with heart failure: Secondary | ICD-10-CM | POA: Diagnosis not present

## 2014-11-18 DIAGNOSIS — L97519 Non-pressure chronic ulcer of other part of right foot with unspecified severity: Secondary | ICD-10-CM | POA: Diagnosis not present

## 2014-11-18 DIAGNOSIS — Z7982 Long term (current) use of aspirin: Secondary | ICD-10-CM | POA: Diagnosis not present

## 2014-11-18 DIAGNOSIS — E1140 Type 2 diabetes mellitus with diabetic neuropathy, unspecified: Secondary | ICD-10-CM | POA: Diagnosis not present

## 2014-11-18 DIAGNOSIS — I872 Venous insufficiency (chronic) (peripheral): Secondary | ICD-10-CM | POA: Diagnosis not present

## 2014-11-18 DIAGNOSIS — I252 Old myocardial infarction: Secondary | ICD-10-CM | POA: Diagnosis not present

## 2014-11-18 DIAGNOSIS — Z794 Long term (current) use of insulin: Secondary | ICD-10-CM | POA: Diagnosis not present

## 2014-11-18 DIAGNOSIS — E1165 Type 2 diabetes mellitus with hyperglycemia: Secondary | ICD-10-CM | POA: Diagnosis not present

## 2014-11-18 DIAGNOSIS — I5043 Acute on chronic combined systolic (congestive) and diastolic (congestive) heart failure: Secondary | ICD-10-CM | POA: Diagnosis not present

## 2014-11-18 DIAGNOSIS — Z8673 Personal history of transient ischemic attack (TIA), and cerebral infarction without residual deficits: Secondary | ICD-10-CM | POA: Diagnosis not present

## 2014-11-18 DIAGNOSIS — Z7901 Long term (current) use of anticoagulants: Secondary | ICD-10-CM | POA: Diagnosis not present

## 2014-11-18 DIAGNOSIS — J90 Pleural effusion, not elsewhere classified: Secondary | ICD-10-CM | POA: Diagnosis not present

## 2014-11-18 DIAGNOSIS — Z951 Presence of aortocoronary bypass graft: Secondary | ICD-10-CM | POA: Diagnosis not present

## 2014-11-18 DIAGNOSIS — I2510 Atherosclerotic heart disease of native coronary artery without angina pectoris: Secondary | ICD-10-CM | POA: Diagnosis not present

## 2014-11-18 DIAGNOSIS — I272 Other secondary pulmonary hypertension: Secondary | ICD-10-CM | POA: Diagnosis not present

## 2014-11-18 DIAGNOSIS — Z9981 Dependence on supplemental oxygen: Secondary | ICD-10-CM | POA: Diagnosis not present

## 2014-11-19 DIAGNOSIS — I2510 Atherosclerotic heart disease of native coronary artery without angina pectoris: Secondary | ICD-10-CM | POA: Diagnosis not present

## 2014-11-19 DIAGNOSIS — J90 Pleural effusion, not elsewhere classified: Secondary | ICD-10-CM | POA: Diagnosis not present

## 2014-11-19 DIAGNOSIS — I872 Venous insufficiency (chronic) (peripheral): Secondary | ICD-10-CM | POA: Diagnosis not present

## 2014-11-19 DIAGNOSIS — E1140 Type 2 diabetes mellitus with diabetic neuropathy, unspecified: Secondary | ICD-10-CM | POA: Diagnosis not present

## 2014-11-19 DIAGNOSIS — I110 Hypertensive heart disease with heart failure: Secondary | ICD-10-CM | POA: Diagnosis not present

## 2014-11-19 DIAGNOSIS — I5043 Acute on chronic combined systolic (congestive) and diastolic (congestive) heart failure: Secondary | ICD-10-CM | POA: Diagnosis not present

## 2014-11-20 DIAGNOSIS — I5043 Acute on chronic combined systolic (congestive) and diastolic (congestive) heart failure: Secondary | ICD-10-CM | POA: Diagnosis not present

## 2014-11-20 DIAGNOSIS — E1140 Type 2 diabetes mellitus with diabetic neuropathy, unspecified: Secondary | ICD-10-CM | POA: Diagnosis not present

## 2014-11-20 DIAGNOSIS — I872 Venous insufficiency (chronic) (peripheral): Secondary | ICD-10-CM | POA: Diagnosis not present

## 2014-11-20 DIAGNOSIS — I110 Hypertensive heart disease with heart failure: Secondary | ICD-10-CM | POA: Diagnosis not present

## 2014-11-20 DIAGNOSIS — I2510 Atherosclerotic heart disease of native coronary artery without angina pectoris: Secondary | ICD-10-CM | POA: Diagnosis not present

## 2014-11-20 DIAGNOSIS — J90 Pleural effusion, not elsewhere classified: Secondary | ICD-10-CM | POA: Diagnosis not present

## 2014-11-22 DIAGNOSIS — I2510 Atherosclerotic heart disease of native coronary artery without angina pectoris: Secondary | ICD-10-CM | POA: Diagnosis not present

## 2014-11-22 DIAGNOSIS — J90 Pleural effusion, not elsewhere classified: Secondary | ICD-10-CM | POA: Diagnosis not present

## 2014-11-22 DIAGNOSIS — I5043 Acute on chronic combined systolic (congestive) and diastolic (congestive) heart failure: Secondary | ICD-10-CM | POA: Diagnosis not present

## 2014-11-22 DIAGNOSIS — I110 Hypertensive heart disease with heart failure: Secondary | ICD-10-CM | POA: Diagnosis not present

## 2014-11-22 DIAGNOSIS — I872 Venous insufficiency (chronic) (peripheral): Secondary | ICD-10-CM | POA: Diagnosis not present

## 2014-11-22 DIAGNOSIS — E1140 Type 2 diabetes mellitus with diabetic neuropathy, unspecified: Secondary | ICD-10-CM | POA: Diagnosis not present

## 2014-11-23 DIAGNOSIS — I2510 Atherosclerotic heart disease of native coronary artery without angina pectoris: Secondary | ICD-10-CM | POA: Diagnosis not present

## 2014-11-23 DIAGNOSIS — J90 Pleural effusion, not elsewhere classified: Secondary | ICD-10-CM | POA: Diagnosis not present

## 2014-11-23 DIAGNOSIS — I5043 Acute on chronic combined systolic (congestive) and diastolic (congestive) heart failure: Secondary | ICD-10-CM | POA: Diagnosis not present

## 2014-11-23 DIAGNOSIS — I872 Venous insufficiency (chronic) (peripheral): Secondary | ICD-10-CM | POA: Diagnosis not present

## 2014-11-23 DIAGNOSIS — I110 Hypertensive heart disease with heart failure: Secondary | ICD-10-CM | POA: Diagnosis not present

## 2014-11-23 DIAGNOSIS — E1140 Type 2 diabetes mellitus with diabetic neuropathy, unspecified: Secondary | ICD-10-CM | POA: Diagnosis not present

## 2014-11-24 DIAGNOSIS — I5043 Acute on chronic combined systolic (congestive) and diastolic (congestive) heart failure: Secondary | ICD-10-CM | POA: Diagnosis not present

## 2014-11-24 DIAGNOSIS — I110 Hypertensive heart disease with heart failure: Secondary | ICD-10-CM | POA: Diagnosis not present

## 2014-11-24 DIAGNOSIS — I872 Venous insufficiency (chronic) (peripheral): Secondary | ICD-10-CM | POA: Diagnosis not present

## 2014-11-24 DIAGNOSIS — J90 Pleural effusion, not elsewhere classified: Secondary | ICD-10-CM | POA: Diagnosis not present

## 2014-11-24 DIAGNOSIS — I2510 Atherosclerotic heart disease of native coronary artery without angina pectoris: Secondary | ICD-10-CM | POA: Diagnosis not present

## 2014-11-24 DIAGNOSIS — E1140 Type 2 diabetes mellitus with diabetic neuropathy, unspecified: Secondary | ICD-10-CM | POA: Diagnosis not present

## 2014-11-25 DIAGNOSIS — I110 Hypertensive heart disease with heart failure: Secondary | ICD-10-CM | POA: Diagnosis not present

## 2014-11-25 DIAGNOSIS — J90 Pleural effusion, not elsewhere classified: Secondary | ICD-10-CM | POA: Diagnosis not present

## 2014-11-25 DIAGNOSIS — I2510 Atherosclerotic heart disease of native coronary artery without angina pectoris: Secondary | ICD-10-CM | POA: Diagnosis not present

## 2014-11-25 DIAGNOSIS — I872 Venous insufficiency (chronic) (peripheral): Secondary | ICD-10-CM | POA: Diagnosis not present

## 2014-11-25 DIAGNOSIS — E1140 Type 2 diabetes mellitus with diabetic neuropathy, unspecified: Secondary | ICD-10-CM | POA: Diagnosis not present

## 2014-11-25 DIAGNOSIS — I5043 Acute on chronic combined systolic (congestive) and diastolic (congestive) heart failure: Secondary | ICD-10-CM | POA: Diagnosis not present

## 2014-11-26 DIAGNOSIS — I2510 Atherosclerotic heart disease of native coronary artery without angina pectoris: Secondary | ICD-10-CM | POA: Diagnosis not present

## 2014-11-26 DIAGNOSIS — I110 Hypertensive heart disease with heart failure: Secondary | ICD-10-CM | POA: Diagnosis not present

## 2014-11-26 DIAGNOSIS — J90 Pleural effusion, not elsewhere classified: Secondary | ICD-10-CM | POA: Diagnosis not present

## 2014-11-26 DIAGNOSIS — I5043 Acute on chronic combined systolic (congestive) and diastolic (congestive) heart failure: Secondary | ICD-10-CM | POA: Diagnosis not present

## 2014-11-26 DIAGNOSIS — I872 Venous insufficiency (chronic) (peripheral): Secondary | ICD-10-CM | POA: Diagnosis not present

## 2014-11-26 DIAGNOSIS — E1140 Type 2 diabetes mellitus with diabetic neuropathy, unspecified: Secondary | ICD-10-CM | POA: Diagnosis not present

## 2014-11-28 DIAGNOSIS — I5043 Acute on chronic combined systolic (congestive) and diastolic (congestive) heart failure: Secondary | ICD-10-CM | POA: Diagnosis not present

## 2014-11-28 DIAGNOSIS — E1140 Type 2 diabetes mellitus with diabetic neuropathy, unspecified: Secondary | ICD-10-CM | POA: Diagnosis not present

## 2014-11-28 DIAGNOSIS — I110 Hypertensive heart disease with heart failure: Secondary | ICD-10-CM | POA: Diagnosis not present

## 2014-11-28 DIAGNOSIS — I2510 Atherosclerotic heart disease of native coronary artery without angina pectoris: Secondary | ICD-10-CM | POA: Diagnosis not present

## 2014-11-28 DIAGNOSIS — J90 Pleural effusion, not elsewhere classified: Secondary | ICD-10-CM | POA: Diagnosis not present

## 2014-11-28 DIAGNOSIS — I872 Venous insufficiency (chronic) (peripheral): Secondary | ICD-10-CM | POA: Diagnosis not present

## 2014-11-30 DIAGNOSIS — E1140 Type 2 diabetes mellitus with diabetic neuropathy, unspecified: Secondary | ICD-10-CM | POA: Diagnosis not present

## 2014-11-30 DIAGNOSIS — I872 Venous insufficiency (chronic) (peripheral): Secondary | ICD-10-CM | POA: Diagnosis not present

## 2014-11-30 DIAGNOSIS — I2510 Atherosclerotic heart disease of native coronary artery without angina pectoris: Secondary | ICD-10-CM | POA: Diagnosis not present

## 2014-11-30 DIAGNOSIS — I5043 Acute on chronic combined systolic (congestive) and diastolic (congestive) heart failure: Secondary | ICD-10-CM | POA: Diagnosis not present

## 2014-11-30 DIAGNOSIS — J90 Pleural effusion, not elsewhere classified: Secondary | ICD-10-CM | POA: Diagnosis not present

## 2014-11-30 DIAGNOSIS — I110 Hypertensive heart disease with heart failure: Secondary | ICD-10-CM | POA: Diagnosis not present

## 2014-12-02 DIAGNOSIS — I5043 Acute on chronic combined systolic (congestive) and diastolic (congestive) heart failure: Secondary | ICD-10-CM | POA: Diagnosis not present

## 2014-12-02 DIAGNOSIS — E1140 Type 2 diabetes mellitus with diabetic neuropathy, unspecified: Secondary | ICD-10-CM | POA: Diagnosis not present

## 2014-12-02 DIAGNOSIS — I110 Hypertensive heart disease with heart failure: Secondary | ICD-10-CM | POA: Diagnosis not present

## 2014-12-02 DIAGNOSIS — I2510 Atherosclerotic heart disease of native coronary artery without angina pectoris: Secondary | ICD-10-CM | POA: Diagnosis not present

## 2014-12-02 DIAGNOSIS — J90 Pleural effusion, not elsewhere classified: Secondary | ICD-10-CM | POA: Diagnosis not present

## 2014-12-02 DIAGNOSIS — I872 Venous insufficiency (chronic) (peripheral): Secondary | ICD-10-CM | POA: Diagnosis not present

## 2014-12-04 DIAGNOSIS — I5043 Acute on chronic combined systolic (congestive) and diastolic (congestive) heart failure: Secondary | ICD-10-CM | POA: Diagnosis not present

## 2014-12-04 DIAGNOSIS — J90 Pleural effusion, not elsewhere classified: Secondary | ICD-10-CM | POA: Diagnosis not present

## 2014-12-04 DIAGNOSIS — I2510 Atherosclerotic heart disease of native coronary artery without angina pectoris: Secondary | ICD-10-CM | POA: Diagnosis not present

## 2014-12-04 DIAGNOSIS — I110 Hypertensive heart disease with heart failure: Secondary | ICD-10-CM | POA: Diagnosis not present

## 2014-12-04 DIAGNOSIS — E1140 Type 2 diabetes mellitus with diabetic neuropathy, unspecified: Secondary | ICD-10-CM | POA: Diagnosis not present

## 2014-12-04 DIAGNOSIS — I872 Venous insufficiency (chronic) (peripheral): Secondary | ICD-10-CM | POA: Diagnosis not present

## 2014-12-06 DIAGNOSIS — I2510 Atherosclerotic heart disease of native coronary artery without angina pectoris: Secondary | ICD-10-CM | POA: Diagnosis not present

## 2014-12-06 DIAGNOSIS — E1140 Type 2 diabetes mellitus with diabetic neuropathy, unspecified: Secondary | ICD-10-CM | POA: Diagnosis not present

## 2014-12-06 DIAGNOSIS — I872 Venous insufficiency (chronic) (peripheral): Secondary | ICD-10-CM | POA: Diagnosis not present

## 2014-12-06 DIAGNOSIS — I5043 Acute on chronic combined systolic (congestive) and diastolic (congestive) heart failure: Secondary | ICD-10-CM | POA: Diagnosis not present

## 2014-12-06 DIAGNOSIS — J90 Pleural effusion, not elsewhere classified: Secondary | ICD-10-CM | POA: Diagnosis not present

## 2014-12-06 DIAGNOSIS — I110 Hypertensive heart disease with heart failure: Secondary | ICD-10-CM | POA: Diagnosis not present

## 2014-12-07 DIAGNOSIS — E1140 Type 2 diabetes mellitus with diabetic neuropathy, unspecified: Secondary | ICD-10-CM | POA: Diagnosis not present

## 2014-12-07 DIAGNOSIS — I5043 Acute on chronic combined systolic (congestive) and diastolic (congestive) heart failure: Secondary | ICD-10-CM | POA: Diagnosis not present

## 2014-12-07 DIAGNOSIS — J90 Pleural effusion, not elsewhere classified: Secondary | ICD-10-CM | POA: Diagnosis not present

## 2014-12-07 DIAGNOSIS — I110 Hypertensive heart disease with heart failure: Secondary | ICD-10-CM | POA: Diagnosis not present

## 2014-12-07 DIAGNOSIS — I2510 Atherosclerotic heart disease of native coronary artery without angina pectoris: Secondary | ICD-10-CM | POA: Diagnosis not present

## 2014-12-07 DIAGNOSIS — I872 Venous insufficiency (chronic) (peripheral): Secondary | ICD-10-CM | POA: Diagnosis not present

## 2014-12-08 DIAGNOSIS — I110 Hypertensive heart disease with heart failure: Secondary | ICD-10-CM | POA: Diagnosis not present

## 2014-12-08 DIAGNOSIS — I2510 Atherosclerotic heart disease of native coronary artery without angina pectoris: Secondary | ICD-10-CM | POA: Diagnosis not present

## 2014-12-08 DIAGNOSIS — J90 Pleural effusion, not elsewhere classified: Secondary | ICD-10-CM | POA: Diagnosis not present

## 2014-12-08 DIAGNOSIS — I872 Venous insufficiency (chronic) (peripheral): Secondary | ICD-10-CM | POA: Diagnosis not present

## 2014-12-08 DIAGNOSIS — I5043 Acute on chronic combined systolic (congestive) and diastolic (congestive) heart failure: Secondary | ICD-10-CM | POA: Diagnosis not present

## 2014-12-08 DIAGNOSIS — E1140 Type 2 diabetes mellitus with diabetic neuropathy, unspecified: Secondary | ICD-10-CM | POA: Diagnosis not present

## 2014-12-10 DIAGNOSIS — J90 Pleural effusion, not elsewhere classified: Secondary | ICD-10-CM | POA: Diagnosis not present

## 2014-12-10 DIAGNOSIS — I872 Venous insufficiency (chronic) (peripheral): Secondary | ICD-10-CM | POA: Diagnosis not present

## 2014-12-10 DIAGNOSIS — I110 Hypertensive heart disease with heart failure: Secondary | ICD-10-CM | POA: Diagnosis not present

## 2014-12-10 DIAGNOSIS — I5043 Acute on chronic combined systolic (congestive) and diastolic (congestive) heart failure: Secondary | ICD-10-CM | POA: Diagnosis not present

## 2014-12-10 DIAGNOSIS — I2510 Atherosclerotic heart disease of native coronary artery without angina pectoris: Secondary | ICD-10-CM | POA: Diagnosis not present

## 2014-12-10 DIAGNOSIS — E1140 Type 2 diabetes mellitus with diabetic neuropathy, unspecified: Secondary | ICD-10-CM | POA: Diagnosis not present

## 2014-12-11 ENCOUNTER — Telehealth: Payer: Self-pay | Admitting: *Deleted

## 2014-12-11 DIAGNOSIS — I110 Hypertensive heart disease with heart failure: Secondary | ICD-10-CM | POA: Diagnosis not present

## 2014-12-11 DIAGNOSIS — E1140 Type 2 diabetes mellitus with diabetic neuropathy, unspecified: Secondary | ICD-10-CM | POA: Diagnosis not present

## 2014-12-11 DIAGNOSIS — I5043 Acute on chronic combined systolic (congestive) and diastolic (congestive) heart failure: Secondary | ICD-10-CM | POA: Diagnosis not present

## 2014-12-11 DIAGNOSIS — J90 Pleural effusion, not elsewhere classified: Secondary | ICD-10-CM | POA: Diagnosis not present

## 2014-12-11 DIAGNOSIS — I872 Venous insufficiency (chronic) (peripheral): Secondary | ICD-10-CM | POA: Diagnosis not present

## 2014-12-11 DIAGNOSIS — I2510 Atherosclerotic heart disease of native coronary artery without angina pectoris: Secondary | ICD-10-CM | POA: Diagnosis not present

## 2014-12-11 NOTE — Telephone Encounter (Signed)
-----  Message from [name] Sark, MD sent at 12/10/2014  8:28 AM EST ----- Reviewed. Creatinine has increased from 1.0-1.7 and potassium is normal at 4.8. We will likely need to adjust her Demadex dose, but would like to see her first and assess how she is doing clinically as well as her weights. Visit should already be scheduled.

## 2014-12-12 DIAGNOSIS — I872 Venous insufficiency (chronic) (peripheral): Secondary | ICD-10-CM | POA: Diagnosis not present

## 2014-12-12 DIAGNOSIS — J90 Pleural effusion, not elsewhere classified: Secondary | ICD-10-CM | POA: Diagnosis not present

## 2014-12-12 DIAGNOSIS — I2510 Atherosclerotic heart disease of native coronary artery without angina pectoris: Secondary | ICD-10-CM | POA: Diagnosis not present

## 2014-12-12 DIAGNOSIS — I5043 Acute on chronic combined systolic (congestive) and diastolic (congestive) heart failure: Secondary | ICD-10-CM | POA: Diagnosis not present

## 2014-12-12 DIAGNOSIS — I110 Hypertensive heart disease with heart failure: Secondary | ICD-10-CM | POA: Diagnosis not present

## 2014-12-12 DIAGNOSIS — E1140 Type 2 diabetes mellitus with diabetic neuropathy, unspecified: Secondary | ICD-10-CM | POA: Diagnosis not present

## 2014-12-14 DIAGNOSIS — I5043 Acute on chronic combined systolic (congestive) and diastolic (congestive) heart failure: Secondary | ICD-10-CM | POA: Diagnosis not present

## 2014-12-14 DIAGNOSIS — I110 Hypertensive heart disease with heart failure: Secondary | ICD-10-CM | POA: Diagnosis not present

## 2014-12-14 DIAGNOSIS — E1140 Type 2 diabetes mellitus with diabetic neuropathy, unspecified: Secondary | ICD-10-CM | POA: Diagnosis not present

## 2014-12-14 DIAGNOSIS — I872 Venous insufficiency (chronic) (peripheral): Secondary | ICD-10-CM | POA: Diagnosis not present

## 2014-12-14 DIAGNOSIS — J90 Pleural effusion, not elsewhere classified: Secondary | ICD-10-CM | POA: Diagnosis not present

## 2014-12-14 DIAGNOSIS — I2510 Atherosclerotic heart disease of native coronary artery without angina pectoris: Secondary | ICD-10-CM | POA: Diagnosis not present

## 2014-12-16 DIAGNOSIS — I5043 Acute on chronic combined systolic (congestive) and diastolic (congestive) heart failure: Secondary | ICD-10-CM | POA: Diagnosis not present

## 2014-12-16 DIAGNOSIS — J90 Pleural effusion, not elsewhere classified: Secondary | ICD-10-CM | POA: Diagnosis not present

## 2014-12-16 DIAGNOSIS — I2510 Atherosclerotic heart disease of native coronary artery without angina pectoris: Secondary | ICD-10-CM | POA: Diagnosis not present

## 2014-12-16 DIAGNOSIS — I110 Hypertensive heart disease with heart failure: Secondary | ICD-10-CM | POA: Diagnosis not present

## 2014-12-16 DIAGNOSIS — E1140 Type 2 diabetes mellitus with diabetic neuropathy, unspecified: Secondary | ICD-10-CM | POA: Diagnosis not present

## 2014-12-16 DIAGNOSIS — I872 Venous insufficiency (chronic) (peripheral): Secondary | ICD-10-CM | POA: Diagnosis not present

## 2014-12-17 NOTE — Telephone Encounter (Signed)
Patient informed.

## 2014-12-21 ENCOUNTER — Ambulatory Visit (INDEPENDENT_AMBULATORY_CARE_PROVIDER_SITE_OTHER): Payer: Medicare Other | Admitting: Cardiology

## 2014-12-21 ENCOUNTER — Encounter: Payer: Self-pay | Admitting: Cardiology

## 2014-12-21 VITALS — BP 100/49 | HR 43 | Ht 66.0 in | Wt 197.2 lb

## 2014-12-21 DIAGNOSIS — R0602 Shortness of breath: Secondary | ICD-10-CM | POA: Diagnosis not present

## 2014-12-21 DIAGNOSIS — I5032 Chronic diastolic (congestive) heart failure: Secondary | ICD-10-CM

## 2014-12-21 DIAGNOSIS — I6523 Occlusion and stenosis of bilateral carotid arteries: Secondary | ICD-10-CM | POA: Diagnosis not present

## 2014-12-21 DIAGNOSIS — J90 Pleural effusion, not elsewhere classified: Secondary | ICD-10-CM | POA: Diagnosis not present

## 2014-12-21 DIAGNOSIS — I2510 Atherosclerotic heart disease of native coronary artery without angina pectoris: Secondary | ICD-10-CM

## 2014-12-21 DIAGNOSIS — N289 Disorder of kidney and ureter, unspecified: Secondary | ICD-10-CM

## 2014-12-21 DIAGNOSIS — I739 Peripheral vascular disease, unspecified: Secondary | ICD-10-CM

## 2014-12-21 MED ORDER — LISINOPRIL 10 MG PO TABS: 10.0000 mg | ORAL_TABLET | Freq: Every day | ORAL | Status: DC

## 2014-12-21 MED ORDER — CARVEDILOL 12.5 MG PO TABS: 12.5000 mg | ORAL_TABLET | Freq: Two times a day (BID) | ORAL | Status: DC

## 2014-12-21 NOTE — Progress Notes (Signed)
Cardiology Office Note  Date: 12/21/2014   ID: Coletta Lockner Valencia, DOB [birth year], MRN [id number]  PCP: Glenda Chroman., MD  Primary Cardiologist: Rozann Lesches, MD   Chief Complaint  Patient presents with  . Diastolic heart failure  . Coronary Artery Disease    History of Present Illness: [name] [name] is a medically complex 69 y.o. female last seen in November. At the last visit she was continued on Demadex at 40 mg in the morning and 20 mg in the afternoon, follow-up lab work noted below showing increase in creatinine from 1.0 up to 1.7.  She comes in today with another daughter that I had not met yet. She tells me that she has been feeling better, more energy, has been able to be more active. She still has her Pleurx catheter in place, and it is still producing up to 700 cc when drained every other day. Follow-up visit pending with Dr. Luan Pulling.  Weight was up significantly by our scales today, however this seems to be inaccurate. She weighs each day at home and her most recent weight was 182 pounds. This is up from the mid 170s when she was initially discharged from the hospital, so there has been some gradual gain over the last several weeks.  Blood pressure is low normal today and she is bradycardic. We reviewed her medications and we'll plan to reduce Coreg and lisinopril doses at least temporarily.  He does not report any chest pain and has stable NYHA class II dyspnea with low-level activity.  Past Medical History  Diagnosis Date  . Mixed hyperlipidemia   . Essential hypertension, benign   . Depression   . Cellulitis     a. Recurrent, bilateral legs  . Carotid artery disease (HCC)     a. Bilateral CEA; 50-60% bilateral ICA stenosis, 11/12  . PAD (peripheral artery disease) ([place])   . Suicide attempt ([place]) 08/2002  . TIA (transient ischemic attack)   . TMJ syndrome   . Herniated disc   . Ischemic cardiomyopathy     a. 11/2010 TEE: EF 40-45%.  . Critical lower limb  ischemia   . Venous insufficiency   . Coronary atherosclerosis of native coronary artery     a. 11/2010 NSTEMI/CABG x 4: L-LAD; S-PL; S-OM; S-DX, by PVT.  [name] NSTEMI (non-ST elevated myocardial infarction) ([place]) 11/2010  . Pneumonia 2000's X 2  . Type 2 diabetes mellitus ([place])   . History of blood transfusion   . Migraine   . Stroke ([place]) 08/2009  . Arthritis   . Chronic back pain   . On home oxygen therapy   . Chronic diastolic heart failure [facility])     Past Surgical History  Procedure Laterality Date  . Appendectomy    . Cholecystectomy    . Abdominal hysterectomy    . Carotid endarterectomy Bilateral 2011    Bilateral - Dr. Kellie Simmering  . Wrist fracture surgery Left     "grafted bone from hip to wrist"  . Coronary artery bypass graft  11/24/2010    Procedure: CORONARY ARTERY BYPASS GRAFTING (CABG);  Surgeon: Tharon Aquas Adelene Idler, MD;  Location: [place];  Service: Open Heart Surgery;  Laterality: N/A;  Coronary Artery Bypass Graft times four on pump utilizing left internal mammary artery and bilateral greater saphenous veins harvested endoscopically, transesophageal echocardiogram   . Toe surgery Left     "put pin in 2nd toe"  . Lower extremity angiogram  09/08/2012; 10/06/2013    "found  100% blockage; unsuccessful attempt at crossing a chronic total occlusion of the left SFA in the setting of critical limb ischemia  . Dilation and curettage of uterus    . Tubal ligation    . Debridement toe Left     "nonhealing wound; 3rd digit  . Cardiac catheterization  11/2010  . Coronary angioplasty with stent placement  03/2013    "1"  . Colonoscopy w/ biopsies and polypectomy    . Fracture surgery    . Femoral-popliteal bypass graft Left 10/07/2013    Procedure: LEFT FEMORAL-POPLITEAL ARTERY BYPASS GRAFT;  Surgeon: Angelia Mould, MD;  Location: [place];  Service: Vascular;  Laterality: Left;  . Intraoperative arteriogram Left 10/07/2013    Procedure: INTRA OPERATIVE ARTERIOGRAM LEFT LEG;   Surgeon: Angelia Mould, MD;  Location: [place];  Service: Vascular;  Laterality: Left;  . Endarterectomy popliteal Left 10/07/2013    Procedure: LEFT POPLITEAL ENDARTERECTOMY ;  Surgeon: Angelia Mould, MD;  Location: [place];  Service: Vascular;  Laterality: Left;  . Left and right heart catheterization with coronary/graft angiogram N/A 03/27/2013    Procedure: LEFT AND RIGHT HEART CATHETERIZATION WITH Beatrix Fetters;  Surgeon: Burnell Blanks, MD;  Location: [facility] CATH LAB;  Service: Cardiovascular;  Laterality: N/A;  . Lower extremity angiogram Bilateral 09/08/2013    Procedure: LOWER EXTREMITY ANGIOGRAM;  Surgeon: [name], MD;  Location: [facility] CATH LAB;  Service: Cardiovascular;  Laterality: Bilateral;    Current Outpatient Prescriptions  Medication Sig Dispense Refill  . acetaminophen (TYLENOL) 500 MG tablet Take 1,000 mg by mouth every 6 (six) hours as needed.    [name] albuterol (PROVENTIL HFA;VENTOLIN HFA) 108 (90 BASE) MCG/ACT inhaler Inhale 2 puffs into the lungs daily as needed for wheezing or shortness of breath.     [name] amLODipine (NORVASC) 10 MG tablet Take 1 tablet (10 mg total) by mouth daily. 90 tablet 3  . aspirin 81 MG EC tablet Take 81 mg by mouth daily.    [name] atorvastatin (LIPITOR) 40 MG tablet Take 40 mg by mouth daily at 6 PM.    . citalopram (CELEXA) 20 MG tablet Take 20 mg by mouth daily.    . clopidogrel (PLAVIX) 75 MG tablet Take 1 tablet (75 mg total) by mouth daily with breakfast. 30 tablet 9  . Docusate Calcium (STOOL SOFTENER PO) Take 100 mg by mouth daily as needed (for constipation).     . gabapentin (NEURONTIN) 300 MG capsule Take 300 mg by mouth daily.    [name] glimepiride (AMARYL) 2 MG tablet     . hydrALAZINE (APRESOLINE) 25 MG tablet Take 25 mg by mouth 3 (three) times daily.    [name] HYDROcodone-acetaminophen (NORCO/VICODIN) 5-325 MG tablet Take 1 tablet by mouth every 6 (six) hours as needed for moderate pain. 30 tablet 0  . insulin aspart  (NOVOLOG) 100 UNIT/ML injection Inject 3 Units into the skin 3 (three) times daily with meals. 10 mL 11  . insulin detemir (LEVEMIR) 100 UNIT/ML injection Inject 0.07 mLs (7 Units total) into the skin at bedtime. 10 mL 11  . isosorbide mononitrate (IMDUR) 30 MG 24 hr tablet Take 1.5 tablets (45 mg total) by mouth daily.    . Multiple Vitamin (MULTIVITAMIN WITH MINERALS) TABS tablet Take 1 tablet by mouth daily.    . nitroGLYCERIN (NITROSTAT) 0.4 MG SL tablet Place 1 tablet (0.4 mg total) under the tongue every 5 (five) minutes as needed for chest pain. Up to 3 doses. If no  relief after 3rd dose, proceed to the ED for an evaluation 25 tablet 3  . potassium chloride (K-DUR) 10 MEQ tablet Take 1 tablet (10 mEq total) by mouth daily. 90 tablet 3  . torsemide (DEMADEX) 20 MG tablet Take 20 mg by mouth. 2 tabs by mouth every morning & 1 tab at noon    . Umeclidinium-Vilanterol 62.5-25 MCG/INH AEPB Inhale 1 application into the lungs daily.     . carvedilol (COREG) 12.5 MG tablet Take 1 tablet (12.5 mg total) by mouth 2 (two) times daily with a meal. 90 tablet 3  . lisinopril (PRINIVIL,ZESTRIL) 10 MG tablet Take 1 tablet (10 mg total) by mouth daily. 90 tablet 3   No current facility-administered medications for this visit.   Allergies:  Other; Strawberry extract; Sulfa antibiotics; and Tape   Social History: The patient  reports that she quit smoking about 5 years ago. Her smoking use included Cigarettes. She started smoking about 58 years ago. She has a 150 pack-year smoking history. She has never used smokeless tobacco. She reports that she drinks alcohol. She reports that she uses illicit drugs (Marijuana).   ROS:  Please see the history of present illness. Otherwise, complete review of systems is positive for chronic leg edema, left worse than right.  All other systems are reviewed and negative.   Physical Exam: VS:  BP 100/49 mmHg  Pulse 43  Ht _0  (1.676 m)  Wt 197 lb 3.2 oz (89.449 kg)   BMI 31.84 kg/m2  SpO2 93%, BMI Body mass index is 31.84 kg/(m^2).  Wt Readings from Last 3 Encounters:  12/21/14 197 lb 3.2 oz (89.449 kg)  11/13/14 182 lb (82.555 kg)  11/11/14 173 lb 14.4 oz (78.881 kg)    Chronically ill-appearing woman, no distress. Wearing oxygen. HEENT: Conjunctiva and lids normal, oropharynx clear. Neck: Supple, no elevated JVP, bilateral CEA scars, no thyromegaly. Lungs: Clear to auscultation with decreased breath sounds at the right lower zone, Pleurx catheter in place,nonlabored breathing at rest. Cardiac: Regular rate and rhythm, no S3, soft systolic murmur, no pericardial rub. Abdomen: Soft, nontender, protuberant, bowel sounds present, no guarding or rebound. Extremities: 1+ edema, improved, left worse than right, distal pulses 1+. Skin: Warm and dry. Drying and flaking noted mid to distal legs. Musculoskeletal: No kyphosis. Neuropsychiatric: Alert and oriented x3, affect grossly appropriate.  ECG: ECG is not ordered today.  Recent Labwork: 10/10/2014: B Natriuretic Peptide 914.0* 10/12/2014: ALT 23; AST 24; Magnesium 2.1; TSH 2.210 10/16/2014: Hemoglobin 9.0*; Platelets 269 10/18/2014: BUN 32*; Creatinine, Ser 1.02*; Potassium 4.7; Sodium 133*  November 2016: BUN 46, creatinine 1.7, sodium 139, potassium 4.8  Other Studies Reviewed Today:  Echocardiogram 09/09/2014: Study Conclusions  - Left ventricle: The cavity size was normal. Wall thickness was normal. Systolic function was normal. The estimated ejection fraction was in the range of 50% to 55%. Wall motion was normal; there were no regional wall motion abnormalities. - Left atrium: The atrium was mildly dilated.  Assessment and Plan:  1. Chronic diastolic heart failure with low normal LVEF based on most recent assessment. Weight has climbed up gradually, but from a clinical standpoint she states that she feels better and has more energy. Leg edema has been stable. For now I will plan to  continue her current dose of Demadex, however we will reduce her lisinopril to 10 mg daily, follow-up BMET in the next few weeks.  2. Recurrent right pleural effusion status post Pleurx catheter. She is still draining a  significant amount of pleural fluid. Office is calling to arrange follow-up with Dr. Luan Pulling.  3. Ischemic heart disease status post CABG, no active angina symptoms. With significant bradycardia, Coreg dose will be cut in half.  4. Acute renal insufficiency, creatinine of 1.7. Lisinopril dose is being reduced.  5. PAD and bilateral carotid artery disease.  Current medicines were reviewed with the patient today.   Orders Placed This Encounter  Procedures  . Basic metabolic panel  . Ambulatory referral to Pulmonology    Disposition: FU with me in 1 month.   Signed, Satira Sark, MD, [facility] 12/21/2014 2:48 PM    [facility] at [address], [place], [place] [address] Phone: [phone]; Fax: [phone]

## 2014-12-21 NOTE — Patient Instructions (Signed)
Your physician has recommended you make the following change in your medication:  Decrease your lisinopril to 10 mg daily. Please break your 20 mg tablet in half daily until they are finished. Decrease your carvedilol to 12.5 mg twice daily. Please break your 25 mg tablet in half twice daily until they are finished. Continue all other medications the same. Your physician recommends that you return for lab work in: 2 weeks to check your BMET. Please follow up with Dr. Luan Pulling. Your physician recommends that you schedule a follow-up appointment in: 1 month.

## 2014-12-22 DIAGNOSIS — I110 Hypertensive heart disease with heart failure: Secondary | ICD-10-CM | POA: Diagnosis not present

## 2014-12-22 DIAGNOSIS — I872 Venous insufficiency (chronic) (peripheral): Secondary | ICD-10-CM | POA: Diagnosis not present

## 2014-12-22 DIAGNOSIS — E1140 Type 2 diabetes mellitus with diabetic neuropathy, unspecified: Secondary | ICD-10-CM | POA: Diagnosis not present

## 2014-12-22 DIAGNOSIS — I2510 Atherosclerotic heart disease of native coronary artery without angina pectoris: Secondary | ICD-10-CM | POA: Diagnosis not present

## 2014-12-22 DIAGNOSIS — J90 Pleural effusion, not elsewhere classified: Secondary | ICD-10-CM | POA: Diagnosis not present

## 2014-12-22 DIAGNOSIS — I5043 Acute on chronic combined systolic (congestive) and diastolic (congestive) heart failure: Secondary | ICD-10-CM | POA: Diagnosis not present

## 2014-12-24 DIAGNOSIS — I872 Venous insufficiency (chronic) (peripheral): Secondary | ICD-10-CM | POA: Diagnosis not present

## 2014-12-24 DIAGNOSIS — I110 Hypertensive heart disease with heart failure: Secondary | ICD-10-CM | POA: Diagnosis not present

## 2014-12-24 DIAGNOSIS — J90 Pleural effusion, not elsewhere classified: Secondary | ICD-10-CM | POA: Diagnosis not present

## 2014-12-24 DIAGNOSIS — E1140 Type 2 diabetes mellitus with diabetic neuropathy, unspecified: Secondary | ICD-10-CM | POA: Diagnosis not present

## 2014-12-24 DIAGNOSIS — I2510 Atherosclerotic heart disease of native coronary artery without angina pectoris: Secondary | ICD-10-CM | POA: Diagnosis not present

## 2014-12-24 DIAGNOSIS — I5043 Acute on chronic combined systolic (congestive) and diastolic (congestive) heart failure: Secondary | ICD-10-CM | POA: Diagnosis not present

## 2014-12-25 DIAGNOSIS — I2510 Atherosclerotic heart disease of native coronary artery without angina pectoris: Secondary | ICD-10-CM | POA: Diagnosis not present

## 2014-12-25 DIAGNOSIS — I5043 Acute on chronic combined systolic (congestive) and diastolic (congestive) heart failure: Secondary | ICD-10-CM | POA: Diagnosis not present

## 2014-12-25 DIAGNOSIS — I872 Venous insufficiency (chronic) (peripheral): Secondary | ICD-10-CM | POA: Diagnosis not present

## 2014-12-25 DIAGNOSIS — I110 Hypertensive heart disease with heart failure: Secondary | ICD-10-CM | POA: Diagnosis not present

## 2014-12-25 DIAGNOSIS — E1140 Type 2 diabetes mellitus with diabetic neuropathy, unspecified: Secondary | ICD-10-CM | POA: Diagnosis not present

## 2014-12-25 DIAGNOSIS — J90 Pleural effusion, not elsewhere classified: Secondary | ICD-10-CM | POA: Diagnosis not present

## 2014-12-29 DIAGNOSIS — J90 Pleural effusion, not elsewhere classified: Secondary | ICD-10-CM | POA: Diagnosis not present

## 2014-12-29 DIAGNOSIS — I110 Hypertensive heart disease with heart failure: Secondary | ICD-10-CM | POA: Diagnosis not present

## 2014-12-29 DIAGNOSIS — E1140 Type 2 diabetes mellitus with diabetic neuropathy, unspecified: Secondary | ICD-10-CM | POA: Diagnosis not present

## 2014-12-29 DIAGNOSIS — I872 Venous insufficiency (chronic) (peripheral): Secondary | ICD-10-CM | POA: Diagnosis not present

## 2014-12-29 DIAGNOSIS — I5043 Acute on chronic combined systolic (congestive) and diastolic (congestive) heart failure: Secondary | ICD-10-CM | POA: Diagnosis not present

## 2014-12-29 DIAGNOSIS — I2510 Atherosclerotic heart disease of native coronary artery without angina pectoris: Secondary | ICD-10-CM | POA: Diagnosis not present

## 2014-12-31 DIAGNOSIS — I110 Hypertensive heart disease with heart failure: Secondary | ICD-10-CM | POA: Diagnosis not present

## 2014-12-31 DIAGNOSIS — I872 Venous insufficiency (chronic) (peripheral): Secondary | ICD-10-CM | POA: Diagnosis not present

## 2014-12-31 DIAGNOSIS — I5043 Acute on chronic combined systolic (congestive) and diastolic (congestive) heart failure: Secondary | ICD-10-CM | POA: Diagnosis not present

## 2014-12-31 DIAGNOSIS — E1140 Type 2 diabetes mellitus with diabetic neuropathy, unspecified: Secondary | ICD-10-CM | POA: Diagnosis not present

## 2014-12-31 DIAGNOSIS — I2510 Atherosclerotic heart disease of native coronary artery without angina pectoris: Secondary | ICD-10-CM | POA: Diagnosis not present

## 2014-12-31 DIAGNOSIS — J90 Pleural effusion, not elsewhere classified: Secondary | ICD-10-CM | POA: Diagnosis not present

## 2015-01-04 DIAGNOSIS — I110 Hypertensive heart disease with heart failure: Secondary | ICD-10-CM | POA: Diagnosis not present

## 2015-01-04 DIAGNOSIS — E1140 Type 2 diabetes mellitus with diabetic neuropathy, unspecified: Secondary | ICD-10-CM | POA: Diagnosis not present

## 2015-01-04 DIAGNOSIS — I5043 Acute on chronic combined systolic (congestive) and diastolic (congestive) heart failure: Secondary | ICD-10-CM | POA: Diagnosis not present

## 2015-01-04 DIAGNOSIS — I2510 Atherosclerotic heart disease of native coronary artery without angina pectoris: Secondary | ICD-10-CM | POA: Diagnosis not present

## 2015-01-04 DIAGNOSIS — J90 Pleural effusion, not elsewhere classified: Secondary | ICD-10-CM | POA: Diagnosis not present

## 2015-01-04 DIAGNOSIS — I872 Venous insufficiency (chronic) (peripheral): Secondary | ICD-10-CM | POA: Diagnosis not present

## 2015-01-11 DIAGNOSIS — M47812 Spondylosis without myelopathy or radiculopathy, cervical region: Secondary | ICD-10-CM | POA: Diagnosis not present

## 2015-01-11 DIAGNOSIS — M9901 Segmental and somatic dysfunction of cervical region: Secondary | ICD-10-CM | POA: Diagnosis not present

## 2015-01-11 DIAGNOSIS — M9902 Segmental and somatic dysfunction of thoracic region: Secondary | ICD-10-CM | POA: Diagnosis not present

## 2015-01-11 DIAGNOSIS — M546 Pain in thoracic spine: Secondary | ICD-10-CM | POA: Diagnosis not present

## 2015-01-11 DIAGNOSIS — M9903 Segmental and somatic dysfunction of lumbar region: Secondary | ICD-10-CM | POA: Diagnosis not present

## 2015-01-11 DIAGNOSIS — M47816 Spondylosis without myelopathy or radiculopathy, lumbar region: Secondary | ICD-10-CM | POA: Diagnosis not present

## 2015-01-11 DIAGNOSIS — M545 Low back pain: Secondary | ICD-10-CM | POA: Diagnosis not present

## 2015-01-12 DIAGNOSIS — I872 Venous insufficiency (chronic) (peripheral): Secondary | ICD-10-CM | POA: Diagnosis not present

## 2015-01-12 DIAGNOSIS — I110 Hypertensive heart disease with heart failure: Secondary | ICD-10-CM | POA: Diagnosis not present

## 2015-01-12 DIAGNOSIS — E1140 Type 2 diabetes mellitus with diabetic neuropathy, unspecified: Secondary | ICD-10-CM | POA: Diagnosis not present

## 2015-01-12 DIAGNOSIS — J90 Pleural effusion, not elsewhere classified: Secondary | ICD-10-CM | POA: Diagnosis not present

## 2015-01-12 DIAGNOSIS — I5043 Acute on chronic combined systolic (congestive) and diastolic (congestive) heart failure: Secondary | ICD-10-CM | POA: Diagnosis not present

## 2015-01-12 DIAGNOSIS — I2510 Atherosclerotic heart disease of native coronary artery without angina pectoris: Secondary | ICD-10-CM | POA: Diagnosis not present

## 2015-01-13 ENCOUNTER — Telehealth: Payer: Self-pay | Admitting: *Deleted

## 2015-01-13 MED ORDER — POTASSIUM CHLORIDE ER 10 MEQ PO TBCR: 10.0000 meq | EXTENDED_RELEASE_TABLET | ORAL | Status: DC

## 2015-01-13 NOTE — Telephone Encounter (Signed)
-----  Message from Satira Sark, MD sent at 01/12/2015  6:40 PM EST ----- Reviewed. Creatinine down to 1.5 since reduction in ACE-I. Potassium mildly increased at 5.2. Cut KCL 10 mEq to every other day.

## 2015-01-13 NOTE — Telephone Encounter (Signed)
Patient informed and verbalized understanding of plan.

## 2015-01-17 DIAGNOSIS — Z87891 Personal history of nicotine dependence: Secondary | ICD-10-CM | POA: Diagnosis not present

## 2015-01-17 DIAGNOSIS — L97519 Non-pressure chronic ulcer of other part of right foot with unspecified severity: Secondary | ICD-10-CM | POA: Diagnosis not present

## 2015-01-17 DIAGNOSIS — I110 Hypertensive heart disease with heart failure: Secondary | ICD-10-CM | POA: Diagnosis not present

## 2015-01-17 DIAGNOSIS — I872 Venous insufficiency (chronic) (peripheral): Secondary | ICD-10-CM | POA: Diagnosis not present

## 2015-01-17 DIAGNOSIS — Z9981 Dependence on supplemental oxygen: Secondary | ICD-10-CM | POA: Diagnosis not present

## 2015-01-17 DIAGNOSIS — I2510 Atherosclerotic heart disease of native coronary artery without angina pectoris: Secondary | ICD-10-CM | POA: Diagnosis not present

## 2015-01-17 DIAGNOSIS — F329 Major depressive disorder, single episode, unspecified: Secondary | ICD-10-CM | POA: Diagnosis not present

## 2015-01-17 DIAGNOSIS — E1140 Type 2 diabetes mellitus with diabetic neuropathy, unspecified: Secondary | ICD-10-CM | POA: Diagnosis not present

## 2015-01-17 DIAGNOSIS — J90 Pleural effusion, not elsewhere classified: Secondary | ICD-10-CM | POA: Diagnosis not present

## 2015-01-17 DIAGNOSIS — Z951 Presence of aortocoronary bypass graft: Secondary | ICD-10-CM | POA: Diagnosis not present

## 2015-01-17 DIAGNOSIS — Z7901 Long term (current) use of anticoagulants: Secondary | ICD-10-CM | POA: Diagnosis not present

## 2015-01-17 DIAGNOSIS — I5043 Acute on chronic combined systolic (congestive) and diastolic (congestive) heart failure: Secondary | ICD-10-CM | POA: Diagnosis not present

## 2015-01-17 DIAGNOSIS — I272 Other secondary pulmonary hypertension: Secondary | ICD-10-CM | POA: Diagnosis not present

## 2015-01-17 DIAGNOSIS — Z8673 Personal history of transient ischemic attack (TIA), and cerebral infarction without residual deficits: Secondary | ICD-10-CM | POA: Diagnosis not present

## 2015-01-17 DIAGNOSIS — Z7982 Long term (current) use of aspirin: Secondary | ICD-10-CM | POA: Diagnosis not present

## 2015-01-17 DIAGNOSIS — E1165 Type 2 diabetes mellitus with hyperglycemia: Secondary | ICD-10-CM | POA: Diagnosis not present

## 2015-01-17 DIAGNOSIS — Z794 Long term (current) use of insulin: Secondary | ICD-10-CM | POA: Diagnosis not present

## 2015-01-17 DIAGNOSIS — I252 Old myocardial infarction: Secondary | ICD-10-CM | POA: Diagnosis not present

## 2015-01-20 DIAGNOSIS — I2510 Atherosclerotic heart disease of native coronary artery without angina pectoris: Secondary | ICD-10-CM | POA: Diagnosis not present

## 2015-01-20 DIAGNOSIS — I5043 Acute on chronic combined systolic (congestive) and diastolic (congestive) heart failure: Secondary | ICD-10-CM | POA: Diagnosis not present

## 2015-01-20 DIAGNOSIS — E1140 Type 2 diabetes mellitus with diabetic neuropathy, unspecified: Secondary | ICD-10-CM | POA: Diagnosis not present

## 2015-01-20 DIAGNOSIS — I110 Hypertensive heart disease with heart failure: Secondary | ICD-10-CM | POA: Diagnosis not present

## 2015-01-20 DIAGNOSIS — J90 Pleural effusion, not elsewhere classified: Secondary | ICD-10-CM | POA: Diagnosis not present

## 2015-01-20 DIAGNOSIS — I872 Venous insufficiency (chronic) (peripheral): Secondary | ICD-10-CM | POA: Diagnosis not present

## 2015-01-22 ENCOUNTER — Other Ambulatory Visit: Payer: Self-pay | Admitting: *Deleted

## 2015-01-22 ENCOUNTER — Ambulatory Visit (INDEPENDENT_AMBULATORY_CARE_PROVIDER_SITE_OTHER): Payer: Medicare Other | Admitting: Cardiology

## 2015-01-22 ENCOUNTER — Encounter: Payer: Self-pay | Admitting: Cardiology

## 2015-01-22 VITALS — BP 134/62 | HR 46 | Ht 66.0 in | Wt 202.6 lb

## 2015-01-22 DIAGNOSIS — I5033 Acute on chronic diastolic (congestive) heart failure: Secondary | ICD-10-CM | POA: Diagnosis not present

## 2015-01-22 DIAGNOSIS — R0602 Shortness of breath: Secondary | ICD-10-CM

## 2015-01-22 DIAGNOSIS — I10 Essential (primary) hypertension: Secondary | ICD-10-CM

## 2015-01-22 DIAGNOSIS — I5043 Acute on chronic combined systolic (congestive) and diastolic (congestive) heart failure: Secondary | ICD-10-CM | POA: Diagnosis not present

## 2015-01-22 DIAGNOSIS — E1140 Type 2 diabetes mellitus with diabetic neuropathy, unspecified: Secondary | ICD-10-CM | POA: Diagnosis not present

## 2015-01-22 DIAGNOSIS — J90 Pleural effusion, not elsewhere classified: Secondary | ICD-10-CM | POA: Diagnosis not present

## 2015-01-22 DIAGNOSIS — I872 Venous insufficiency (chronic) (peripheral): Secondary | ICD-10-CM | POA: Diagnosis not present

## 2015-01-22 DIAGNOSIS — I110 Hypertensive heart disease with heart failure: Secondary | ICD-10-CM | POA: Diagnosis not present

## 2015-01-22 DIAGNOSIS — I5032 Chronic diastolic (congestive) heart failure: Secondary | ICD-10-CM

## 2015-01-22 DIAGNOSIS — I2510 Atherosclerotic heart disease of native coronary artery without angina pectoris: Secondary | ICD-10-CM

## 2015-01-22 DIAGNOSIS — N183 Chronic kidney disease, stage 3 (moderate): Secondary | ICD-10-CM

## 2015-01-22 DIAGNOSIS — R001 Bradycardia, unspecified: Secondary | ICD-10-CM

## 2015-01-22 MED ORDER — CARVEDILOL 6.25 MG PO TABS: 6.2500 mg | ORAL_TABLET | Freq: Two times a day (BID) | ORAL | Status: DC

## 2015-01-22 MED ORDER — TORSEMIDE 20 MG PO TABS: 40.0000 mg | ORAL_TABLET | ORAL | Status: DC

## 2015-01-22 MED ORDER — METOLAZONE 2.5 MG PO TABS: 2.5000 mg | ORAL_TABLET | ORAL | Status: DC

## 2015-01-22 NOTE — Progress Notes (Signed)
Cardiology Office Note  Date: 01/22/2015   ID: [name] [name], DOB 06/26/1945, MRN [id number]  PCP: Glenda Chroman., MD  Primary Cardiologist: Rozann Lesches, MD   Chief Complaint  Patient presents with  . Diastolic heart failure  . Coronary Artery Disease    History of Present Illness: [name] [name] is a medically complex 70 y.o. female last seen in December 2016. She is here with one of her daughters today. Since I last saw her she states that her weight has climbed about 6 pounds. She reports compliance with her medications, in fact already increased her Demadex to 40 mg twice daily without effect. She also still has her Pleurx catheter in place and they tell me that it drains 750 cc every other day.  Recent follow-up lab work showed reduction in creatinine from 1.7-1.5. ACE inhibitor dose had already been reduced and we cut back her potassium supplementation as well.  Today we discussed her medications. I am concerned that with her continued weight gain and history of renal insufficiency, she is going to wind up back in the hospital on IV diuretics. Our plan at this time will be to advance Demadex and add temporary metolazone in an effort to get her weight going back in the right direction. We will also reduce Coreg dose given low resting heart rates.  Otherwise, she does not report any angina symptoms. She has had worsening leg edema.  Past Medical History  Diagnosis Date  . Mixed hyperlipidemia   . Essential hypertension, benign   . Depression   . Cellulitis     a. Recurrent, bilateral legs  . Carotid artery disease (HCC)     a. Bilateral CEA; 50-60% bilateral ICA stenosis, 11/12  . PAD (peripheral artery disease) ([place])   . Suicide attempt ([place]) 08/2002  . TIA (transient ischemic attack)   . TMJ syndrome   . Herniated disc   . Ischemic cardiomyopathy     a. 11/2010 TEE: EF 40-45%.  . Critical lower limb ischemia   . Venous insufficiency   . Coronary atherosclerosis of  native coronary artery     a. 11/2010 NSTEMI/CABG x 4: L-LAD; S-PL; S-OM; S-DX, by PVT.  [name] NSTEMI (non-ST elevated myocardial infarction) ([place]) 11/2010  . Pneumonia 2000's X 2  . Type 2 diabetes mellitus ([place])   . History of blood transfusion   . Migraine   . Stroke ([place]) 08/2009  . Arthritis   . Chronic back pain   . On home oxygen therapy   . Chronic diastolic heart failure [facility])     Past Surgical History  Procedure Laterality Date  . Appendectomy    . Cholecystectomy    . Abdominal hysterectomy    . Carotid endarterectomy Bilateral 2011    Bilateral - Dr. Kellie Simmering  . Wrist fracture surgery Left     "grafted bone from hip to wrist"  . Coronary artery bypass graft  11/24/2010    Procedure: CORONARY ARTERY BYPASS GRAFTING (CABG);  Surgeon: Tharon Aquas Adelene Idler, MD;  Location: [place];  Service: Open Heart Surgery;  Laterality: N/A;  Coronary Artery Bypass Graft times four on pump utilizing left internal mammary artery and bilateral greater saphenous veins harvested endoscopically, transesophageal echocardiogram   . Toe surgery Left     "put pin in 2nd toe"  . Lower extremity angiogram  09/08/2012; 10/06/2013    "found 100% blockage; unsuccessful attempt at crossing a chronic total occlusion of the left SFA in the setting of  critical limb ischemia  . Dilation and curettage of uterus    . Tubal ligation    . Debridement toe Left     "nonhealing wound; 3rd digit  . Cardiac catheterization  11/2010  . Coronary angioplasty with stent placement  03/2013    "1"  . Colonoscopy w/ biopsies and polypectomy    . Fracture surgery    . Femoral-popliteal bypass graft Left 10/07/2013    Procedure: LEFT FEMORAL-POPLITEAL ARTERY BYPASS GRAFT;  Surgeon: Angelia Mould, MD;  Location: [place];  Service: Vascular;  Laterality: Left;  . Intraoperative arteriogram Left 10/07/2013    Procedure: INTRA OPERATIVE ARTERIOGRAM LEFT LEG;  Surgeon: Angelia Mould, MD;  Location: [place];  Service:  Vascular;  Laterality: Left;  . Endarterectomy popliteal Left 10/07/2013    Procedure: LEFT POPLITEAL ENDARTERECTOMY ;  Surgeon: Angelia Mould, MD;  Location: [place];  Service: Vascular;  Laterality: Left;  . Left and right heart catheterization with coronary/graft angiogram N/A 03/27/2013    Procedure: LEFT AND RIGHT HEART CATHETERIZATION WITH Beatrix Fetters;  Surgeon: Burnell Blanks, MD;  Location: [facility] CATH LAB;  Service: Cardiovascular;  Laterality: N/A;  . Lower extremity angiogram Bilateral 09/08/2013    Procedure: LOWER EXTREMITY ANGIOGRAM;  Surgeon: [name], MD;  Location: [facility] CATH LAB;  Service: Cardiovascular;  Laterality: Bilateral;    Current Outpatient Prescriptions  Medication Sig Dispense Refill  . acetaminophen (TYLENOL) 500 MG tablet Take 1,000 mg by mouth every 6 (six) hours as needed.    [name] albuterol (PROVENTIL HFA;VENTOLIN HFA) 108 (90 BASE) MCG/ACT inhaler Inhale 2 puffs into the lungs daily as needed for wheezing or shortness of breath.     [name] amLODipine (NORVASC) 10 MG tablet Take 1 tablet (10 mg total) by mouth daily. 90 tablet 3  . aspirin 81 MG EC tablet Take 81 mg by mouth daily.    [name] atorvastatin (LIPITOR) 40 MG tablet Take 40 mg by mouth daily at 6 PM.    . carbidopa-levodopa (SINEMET IR) 25-100 MG tablet Take 1 tablet by mouth 3 (three) times daily.    . citalopram (CELEXA) 20 MG tablet Take 20 mg by mouth daily.    . clopidogrel (PLAVIX) 75 MG tablet Take 1 tablet (75 mg total) by mouth daily with breakfast. 30 tablet 9  . Docusate Calcium (STOOL SOFTENER PO) Take 100 mg by mouth daily as needed (for constipation).     . gabapentin (NEURONTIN) 300 MG capsule Take 300 mg by mouth daily.    [name] glimepiride (AMARYL) 2 MG tablet Take 2 mg by mouth 2 (two) times daily.     . hydrALAZINE (APRESOLINE) 25 MG tablet Take 25 mg by mouth 3 (three) times daily.    [name] HYDROcodone-acetaminophen (NORCO/VICODIN) 5-325 MG tablet Take 1 tablet by mouth every  6 (six) hours as needed for moderate pain. 30 tablet 0  . insulin aspart (NOVOLOG) 100 UNIT/ML injection Inject 3 Units into the skin 3 (three) times daily with meals. 10 mL 11  . insulin detemir (LEVEMIR) 100 UNIT/ML injection Inject 0.07 mLs (7 Units total) into the skin at bedtime. 10 mL 11  . isosorbide mononitrate (IMDUR) 30 MG 24 hr tablet Take 1.5 tablets (45 mg total) by mouth daily.    [name] lisinopril (PRINIVIL,ZESTRIL) 10 MG tablet Take 1 tablet (10 mg total) by mouth daily. 90 tablet 3  . Multiple Vitamin (MULTIVITAMIN WITH MINERALS) TABS tablet Take 1 tablet by mouth daily.    . nitroGLYCERIN (  NITROSTAT) 0.4 MG SL tablet Place 1 tablet (0.4 mg total) under the tongue every 5 (five) minutes as needed for chest pain. Up to 3 doses. If no relief after 3rd dose, proceed to the ED for an evaluation 25 tablet 3  . potassium chloride (K-DUR) 10 MEQ tablet Take 1 tablet (10 mEq total) by mouth every other day. 45 tablet 3  . torsemide (DEMADEX) 20 MG tablet Take 2-3 tablets (40-60 mg total) by mouth as directed. Take 60 mg in the morning and 40 mg in the evening. If your weight comes back down 5-6 lbs over weekend, then resume 40 mg twice daily 70 tablet 1  . Umeclidinium-Vilanterol 62.5-25 MCG/INH AEPB Inhale 1 application into the lungs daily.     . carvedilol (COREG) 6.25 MG tablet Take 1 tablet (6.25 mg total) by mouth 2 (two) times daily. 180 tablet 3  . metolazone (ZAROXOLYN) 2.5 MG tablet Take 1 tablet (2.5 mg total) by mouth as directed. For the next few days; If your weight comes down 5-6 lbs over weekend, stop metolazone and go back to torsemide 40 mg twice daily 30 tablet 0   No current facility-administered medications for this visit.   Allergies:  Other; Strawberry extract; Sulfa antibiotics; and Tape   Social History: The patient  reports that she quit smoking about 5 years ago. Her smoking use included Cigarettes. She started smoking about 59 years ago. She has a 150 pack-year  smoking history. She has never used smokeless tobacco. She reports that she drinks alcohol. She reports that she uses illicit drugs (Marijuana).   ROS:  Please see the history of present illness. Otherwise, complete review of systems is positive for generalized fatigue, increasing abdominal girth and leg edema.  All other systems are reviewed and negative.   Physical Exam: VS:  BP 134/62 mmHg  Pulse 46  Ht _0  (1.676 m)  Wt 202 lb 9.6 oz (91.899 kg)  BMI 32.72 kg/m2  SpO2 90%, BMI Body mass index is 32.72 kg/(m^2).  Wt Readings from Last 3 Encounters:  01/22/15 202 lb 9.6 oz (91.899 kg)  12/21/14 197 lb 3.2 oz (89.449 kg)  11/13/14 182 lb (82.555 kg)    Chronically ill-appearing woman, no distress. Wearing oxygen. HEENT: Conjunctiva and lids normal, oropharynx clear. Neck: Supple, no elevated JVP, bilateral CEA scars, no thyromegaly. Lungs: Clear to auscultation with decreased breath sounds at the right lower zone, Pleurx catheter in place, nonlabored breathing at rest. Cardiac:  Slow regular rate and rhythm, no S3, soft systolic murmur, no pericardial rub. Abdomen: Soft, protuberant, bowel sounds present, no guarding or rebound. Extremities: 2+, distal pulses 1+. Skin: Warm and dry.  Musculoskeletal: No kyphosis. Neuropsychiatric: Alert and oriented x3, affect grossly appropriate.  ECG: Tracing from 10/12/2014 showed sinus arrhythmia with IVCD and nonspecific T-wave changes.  Recent Labwork: 10/10/2014: B Natriuretic Peptide 914.0* 10/12/2014: ALT 23; AST 24; Magnesium 2.1; TSH 2.210 10/16/2014: Hemoglobin 9.0*; Platelets 269 10/18/2014: BUN 32*; Creatinine, Ser 1.02*; Potassium 4.7; Sodium 133*  January 2017: BUN 43, creatinine 1.5, potassium 5.2  Other Studies Reviewed Today:  Echocardiogram 09/09/2014: Study Conclusions  - Left ventricle: The cavity size was normal. Wall thickness was normal. Systolic function was normal. The estimated ejection fraction was in the  range of 50% to 55%. Wall motion was normal; there were no regional wall motion abnormalities. - Left atrium: The atrium was mildly dilated.  Assessment and Plan:  1. Acute on chronic diastolic heart failure with low normal LVEF.  She continues to have weight gain despite already increasing her Demadex at home. Plan at this time is to increase Demadex to 60 mg in the morning and 40 mg in the evening. Also adding metolazone 2.5 mg in the morning for the next few days. If this leads to at least a 5 pound weight loss, we will try to continue managed as an outpatient and get a follow-up BMET in a week. Will try to get her back to a standing Demadex dose of 40 mg twice daily at baseline however. If on the other hand her weight does not start to come down, I suspect she will need to be admitted to the hospital for IV diuresis.  2. CKD stage 3, most recent creatinine improved to 1.5. We reduced her ACE inhibitor dose.   3. CAD status post CABG, no active angina symptoms.  4. Resting bradycardia, heart rate in the high 40s to low 50s. Reduce Coreg to 6.25 mg twice daily.  5. Severe vascular disease including bilateral carotids and peripheral arterial disease.  6. Recurrent right pleural effusion status post Pleurx catheter that continues to drain actively. Might need to consider pleurodesis. She is supposed to be following up with Dr. Luan Pulling in the next few weeks.  Current medicines were reviewed with the patient today.  Disposition: FU with me in 2 weeks.   Signed, Satira Sark, MD, [facility] 01/22/2015 2:55 PM    [facility] at [place], [place], [place] [address] Phone: [phone]; Fax: [phone]

## 2015-01-22 NOTE — Patient Instructions (Signed)
Your physician has recommended you make the following change in your medication:  Decrease your carvedilol to 6.25 mg twice daily. You may break your 12.5 mg tablets in half twice daily until they are finished. Increase torsemide to 60 mg in the morning and 40 mg in the evening. If your weight comes down over the weekend 5-6 lbs, resume 40 mg twice daily.  Start metolazone 2.5 mg daily for the next few days for the next few days; If your weight comes down 5-6 lbs over weekend, stop metolazone and go back to torsemide 40 mg twice daily. If your weight does not come down 5-6 lbs over the weekend, please report to [facility] ED.  Continue all other medications the same. Your physician recommends that you return for lab work in one week around 01/29/15 for a BMET. Your physician recommends that you schedule a follow-up appointment in: 2 weeks.

## 2015-01-25 DIAGNOSIS — I110 Hypertensive heart disease with heart failure: Secondary | ICD-10-CM | POA: Diagnosis not present

## 2015-01-25 DIAGNOSIS — I2510 Atherosclerotic heart disease of native coronary artery without angina pectoris: Secondary | ICD-10-CM | POA: Diagnosis not present

## 2015-01-25 DIAGNOSIS — I5043 Acute on chronic combined systolic (congestive) and diastolic (congestive) heart failure: Secondary | ICD-10-CM | POA: Diagnosis not present

## 2015-01-25 DIAGNOSIS — E1140 Type 2 diabetes mellitus with diabetic neuropathy, unspecified: Secondary | ICD-10-CM | POA: Diagnosis not present

## 2015-01-25 DIAGNOSIS — I872 Venous insufficiency (chronic) (peripheral): Secondary | ICD-10-CM | POA: Diagnosis not present

## 2015-01-25 DIAGNOSIS — J90 Pleural effusion, not elsewhere classified: Secondary | ICD-10-CM | POA: Diagnosis not present

## 2015-01-26 DIAGNOSIS — E1140 Type 2 diabetes mellitus with diabetic neuropathy, unspecified: Secondary | ICD-10-CM | POA: Diagnosis not present

## 2015-01-26 DIAGNOSIS — I2510 Atherosclerotic heart disease of native coronary artery without angina pectoris: Secondary | ICD-10-CM | POA: Diagnosis not present

## 2015-01-26 DIAGNOSIS — I110 Hypertensive heart disease with heart failure: Secondary | ICD-10-CM | POA: Diagnosis not present

## 2015-01-26 DIAGNOSIS — J90 Pleural effusion, not elsewhere classified: Secondary | ICD-10-CM | POA: Diagnosis not present

## 2015-01-26 DIAGNOSIS — I5043 Acute on chronic combined systolic (congestive) and diastolic (congestive) heart failure: Secondary | ICD-10-CM | POA: Diagnosis not present

## 2015-01-26 DIAGNOSIS — I872 Venous insufficiency (chronic) (peripheral): Secondary | ICD-10-CM | POA: Diagnosis not present

## 2015-01-28 DIAGNOSIS — I110 Hypertensive heart disease with heart failure: Secondary | ICD-10-CM | POA: Diagnosis not present

## 2015-01-28 DIAGNOSIS — I2510 Atherosclerotic heart disease of native coronary artery without angina pectoris: Secondary | ICD-10-CM | POA: Diagnosis not present

## 2015-01-28 DIAGNOSIS — J90 Pleural effusion, not elsewhere classified: Secondary | ICD-10-CM | POA: Diagnosis not present

## 2015-01-28 DIAGNOSIS — I872 Venous insufficiency (chronic) (peripheral): Secondary | ICD-10-CM | POA: Diagnosis not present

## 2015-01-28 DIAGNOSIS — E1140 Type 2 diabetes mellitus with diabetic neuropathy, unspecified: Secondary | ICD-10-CM | POA: Diagnosis not present

## 2015-01-28 DIAGNOSIS — I5043 Acute on chronic combined systolic (congestive) and diastolic (congestive) heart failure: Secondary | ICD-10-CM | POA: Diagnosis not present

## 2015-01-29 ENCOUNTER — Other Ambulatory Visit: Payer: Self-pay | Admitting: *Deleted

## 2015-01-29 ENCOUNTER — Telehealth: Payer: Self-pay | Admitting: *Deleted

## 2015-01-29 DIAGNOSIS — R0602 Shortness of breath: Secondary | ICD-10-CM

## 2015-01-29 NOTE — Telephone Encounter (Signed)
-----  Message from Satira Sark, MD sent at 01/29/2015  4:50 PM EST ----- Reviewed. BUN 42, creatinine 1.7, potassium 4.6. Creatinine has bumped up some. See how she is doing on current diuretic regimen and whether she has lost any significant amount of fluid weight.

## 2015-01-29 NOTE — Telephone Encounter (Signed)
Patient informed and says that she feels better with a 10 lb weight loss.

## 2015-02-01 ENCOUNTER — Encounter: Payer: Self-pay | Admitting: Cardiology

## 2015-02-01 DIAGNOSIS — I5043 Acute on chronic combined systolic (congestive) and diastolic (congestive) heart failure: Secondary | ICD-10-CM | POA: Diagnosis not present

## 2015-02-01 DIAGNOSIS — I110 Hypertensive heart disease with heart failure: Secondary | ICD-10-CM | POA: Diagnosis not present

## 2015-02-01 DIAGNOSIS — J90 Pleural effusion, not elsewhere classified: Secondary | ICD-10-CM | POA: Diagnosis not present

## 2015-02-01 DIAGNOSIS — E1140 Type 2 diabetes mellitus with diabetic neuropathy, unspecified: Secondary | ICD-10-CM | POA: Diagnosis not present

## 2015-02-01 DIAGNOSIS — I872 Venous insufficiency (chronic) (peripheral): Secondary | ICD-10-CM | POA: Diagnosis not present

## 2015-02-01 DIAGNOSIS — I2510 Atherosclerotic heart disease of native coronary artery without angina pectoris: Secondary | ICD-10-CM | POA: Diagnosis not present

## 2015-02-05 DIAGNOSIS — I110 Hypertensive heart disease with heart failure: Secondary | ICD-10-CM | POA: Diagnosis not present

## 2015-02-05 DIAGNOSIS — I872 Venous insufficiency (chronic) (peripheral): Secondary | ICD-10-CM | POA: Diagnosis not present

## 2015-02-05 DIAGNOSIS — E1140 Type 2 diabetes mellitus with diabetic neuropathy, unspecified: Secondary | ICD-10-CM | POA: Diagnosis not present

## 2015-02-05 DIAGNOSIS — J90 Pleural effusion, not elsewhere classified: Secondary | ICD-10-CM | POA: Diagnosis not present

## 2015-02-05 DIAGNOSIS — I2510 Atherosclerotic heart disease of native coronary artery without angina pectoris: Secondary | ICD-10-CM | POA: Diagnosis not present

## 2015-02-05 DIAGNOSIS — I5043 Acute on chronic combined systolic (congestive) and diastolic (congestive) heart failure: Secondary | ICD-10-CM | POA: Diagnosis not present

## 2015-02-09 ENCOUNTER — Encounter: Payer: Self-pay | Admitting: Cardiology

## 2015-02-09 ENCOUNTER — Ambulatory Visit (INDEPENDENT_AMBULATORY_CARE_PROVIDER_SITE_OTHER): Payer: Medicare Other | Admitting: Cardiology

## 2015-02-09 VITALS — BP 116/64 | HR 61 | Ht 66.0 in | Wt 190.0 lb

## 2015-02-09 DIAGNOSIS — I5043 Acute on chronic combined systolic (congestive) and diastolic (congestive) heart failure: Secondary | ICD-10-CM | POA: Diagnosis not present

## 2015-02-09 DIAGNOSIS — E1140 Type 2 diabetes mellitus with diabetic neuropathy, unspecified: Secondary | ICD-10-CM | POA: Diagnosis not present

## 2015-02-09 DIAGNOSIS — I5032 Chronic diastolic (congestive) heart failure: Secondary | ICD-10-CM | POA: Diagnosis not present

## 2015-02-09 DIAGNOSIS — Z79899 Other long term (current) drug therapy: Secondary | ICD-10-CM | POA: Diagnosis not present

## 2015-02-09 DIAGNOSIS — N183 Chronic kidney disease, stage 3 (moderate): Secondary | ICD-10-CM

## 2015-02-09 DIAGNOSIS — I2510 Atherosclerotic heart disease of native coronary artery without angina pectoris: Secondary | ICD-10-CM | POA: Diagnosis not present

## 2015-02-09 DIAGNOSIS — J90 Pleural effusion, not elsewhere classified: Secondary | ICD-10-CM | POA: Diagnosis not present

## 2015-02-09 DIAGNOSIS — I110 Hypertensive heart disease with heart failure: Secondary | ICD-10-CM | POA: Diagnosis not present

## 2015-02-09 DIAGNOSIS — I872 Venous insufficiency (chronic) (peripheral): Secondary | ICD-10-CM | POA: Diagnosis not present

## 2015-02-09 NOTE — Patient Instructions (Signed)
Medication Instructions:  Your physician recommends that you continue on your current medications as directed. Please refer to the Current Medication list given to you today.   Labwork: Your physician recommends that you return for lab work in:  just before the 1 month visit in [place]  Testing/Procedures: none  Follow-Up: Your physician recommends that you schedule a follow-up appointment in: 1 month with Dr. Domenic Polite in [place]   Any Other Special Instructions Will Be Listed Below (If Applicable).     If you need a refill on your cardiac medications before your next appointment, please call your pharmacy.

## 2015-02-09 NOTE — Progress Notes (Signed)
Cardiology Office Note  Date: 02/09/2015   ID: [name] [name], DOB October 10, 1945, MRN [id number]  PCP: Glenda Chroman., MD  Primary Cardiologist: Rozann Lesches, MD   Chief Complaint  Patient presents with  . Diastolic heart failure  . Coronary Artery Disease    History of Present Illness: [name] [name] is a medically complex 70 y.o. female last seen on January 13. She presents for a follow-up visit today with her daughter. At her last encounter plan was to increase Demadex to 60 mg in the morning and 40 mg in the evening, also we utilized low-dose metolazone temporarily. We also reduced her Coreg dose due to bradycardia. She states that she feels better, has lost about 12 pounds overall. She reports less shortness of breath, no angina or palpitations. Daughter indicates that the Pleurx catheter is now draining only about 400 cc or less every other day which is a significant improvement.  Today we reviewed her medications which are outlined below. We discussed continuing current diuretic plan, she could still use metolazone for 2 or 3 days in a row if her weight suddenly increased as we have discussed. I reinforced checking daily weights and recording them for review. We also discussed her recent lab work reviewed below which did show a bump in creatinine 1.7.  She tells me that she will be seeing Dr. Luan Pulling in early February for follow-up.  Past Medical History  Diagnosis Date  . Mixed hyperlipidemia   . Essential hypertension, benign   . Depression   . Cellulitis     a. Recurrent, bilateral legs  . Carotid artery disease (HCC)     a. Bilateral CEA; 50-60% bilateral ICA stenosis, 11/12  . PAD (peripheral artery disease) ([place])   . Suicide attempt ([place]) 08/2002  . TIA (transient ischemic attack)   . TMJ syndrome   . Herniated disc   . Ischemic cardiomyopathy     a. 11/2010 TEE: EF 40-45%.  . Critical lower limb ischemia   . Venous insufficiency   . Coronary atherosclerosis of  native coronary artery     a. 11/2010 NSTEMI/CABG x 4: L-LAD; S-PL; S-OM; S-DX, by PVT.  [name] NSTEMI (non-ST elevated myocardial infarction) ([place]) 11/2010  . Pneumonia 2000's X 2  . Type 2 diabetes mellitus ([place])   . History of blood transfusion   . Migraine   . Stroke ([place]) 08/2009  . Arthritis   . Chronic back pain   . On home oxygen therapy   . Chronic diastolic heart failure (HCC)     Current Outpatient Prescriptions  Medication Sig Dispense Refill  . acetaminophen (TYLENOL) 500 MG tablet Take 1,000 mg by mouth every 6 (six) hours as needed.    [name] albuterol (PROVENTIL HFA;VENTOLIN HFA) 108 (90 BASE) MCG/ACT inhaler Inhale 2 puffs into the lungs daily as needed for wheezing or shortness of breath.     [name] amLODipine (NORVASC) 10 MG tablet Take 1 tablet (10 mg total) by mouth daily. 90 tablet 3  . aspirin 81 MG EC tablet Take 81 mg by mouth daily.    [name] atorvastatin (LIPITOR) 40 MG tablet Take 40 mg by mouth daily at 6 PM.    . carbidopa-levodopa (SINEMET IR) 25-100 MG tablet Take 1 tablet by mouth 3 (three) times daily.    . carvedilol (COREG) 6.25 MG tablet Take 1 tablet (6.25 mg total) by mouth 2 (two) times daily. 180 tablet 3  . citalopram (CELEXA) 20 MG tablet Take 20 mg by  mouth daily.    . clopidogrel (PLAVIX) 75 MG tablet Take 1 tablet (75 mg total) by mouth daily with breakfast. 30 tablet 9  . Docusate Calcium (STOOL SOFTENER PO) Take 100 mg by mouth daily as needed (for constipation).     . gabapentin (NEURONTIN) 300 MG capsule Take 300 mg by mouth daily.    [name] glimepiride (AMARYL) 2 MG tablet Take 2 mg by mouth 2 (two) times daily.     . hydrALAZINE (APRESOLINE) 25 MG tablet Take 25 mg by mouth 3 (three) times daily.    [name] HYDROcodone-acetaminophen (NORCO/VICODIN) 5-325 MG tablet Take 1 tablet by mouth every 6 (six) hours as needed for moderate pain. 30 tablet 0  . insulin aspart (NOVOLOG) 100 UNIT/ML injection Inject 3 Units into the skin 3 (three) times daily with meals. 10 mL 11    . insulin detemir (LEVEMIR) 100 UNIT/ML injection Inject 0.07 mLs (7 Units total) into the skin at bedtime. 10 mL 11  . isosorbide mononitrate (IMDUR) 30 MG 24 hr tablet Take 1.5 tablets (45 mg total) by mouth daily.    [name] lisinopril (PRINIVIL,ZESTRIL) 10 MG tablet Take 1 tablet (10 mg total) by mouth daily. 90 tablet 3  . metolazone (ZAROXOLYN) 2.5 MG tablet Take 1 tablet (2.5 mg total) by mouth as directed. For the next few days; If your weight comes down 5-6 lbs over weekend, stop metolazone and go back to torsemide 40 mg twice daily 30 tablet 0  . Multiple Vitamin (MULTIVITAMIN WITH MINERALS) TABS tablet Take 1 tablet by mouth daily.    . nitroGLYCERIN (NITROSTAT) 0.4 MG SL tablet Place 1 tablet (0.4 mg total) under the tongue every 5 (five) minutes as needed for chest pain. Up to 3 doses. If no relief after 3rd dose, proceed to the ED for an evaluation 25 tablet 3  . potassium chloride (K-DUR) 10 MEQ tablet Take 1 tablet (10 mEq total) by mouth every other day. 45 tablet 3  . torsemide (DEMADEX) 20 MG tablet Take 2-3 tablets (40-60 mg total) by mouth as directed. Take 60 mg in the morning and 40 mg in the evening. If your weight comes back down 5-6 lbs over weekend, then resume 40 mg twice daily 70 tablet 1  . Umeclidinium-Vilanterol 62.5-25 MCG/INH AEPB Inhale 1 application into the lungs daily.      No current facility-administered medications for this visit.   Allergies:  Other; Strawberry extract; Sulfa antibiotics; and Tape   Social History: The patient  reports that she quit smoking about 5 years ago. Her smoking use included Cigarettes. She started smoking about 59 years ago. She has a 150 pack-year smoking history. She has never used smokeless tobacco. She reports that she drinks alcohol. She reports that she uses illicit drugs (Marijuana).   ROS:  Please see the history of present illness. Otherwise, complete review of systems is positive for NYHA class 2-3 dyspnea.  All other systems  are reviewed and negative.   Physical Exam: VS:  BP 116/64 mmHg  Pulse 61  Ht _0  (1.676 m)  Wt 190 lb (86.183 kg)  BMI 30.68 kg/m2  SpO2 90%, BMI Body mass index is 30.68 kg/(m^2).  Wt Readings from Last 3 Encounters:  02/09/15 190 lb (86.183 kg)  01/22/15 202 lb 9.6 oz (91.899 kg)  12/21/14 197 lb 3.2 oz (89.449 kg)    Chronically ill-appearing woman, no distress. Wearing oxygen. HEENT: Conjunctiva and lids normal, oropharynx clear. Neck: Supple, no elevated JVP, bilateral CEA  scars, no thyromegaly. Lungs: Clear to auscultation with decreased breath sounds at the right lower zone, Pleurx catheter in place, nonlabored breathing at rest. Cardiac: Regular rate and rhythm, no S3, soft systolic murmur, no pericardial rub. Abdomen: Soft, protuberant, bowel sounds present, no guarding or rebound. Extremities: 1+ edema and stasis, distal pulses 1+.  ECG: ECG is not ordered today.  Recent Labwork: 10/10/2014: B Natriuretic Peptide 914.0* 10/12/2014: ALT 23; AST 24; Magnesium 2.1; TSH 2.210 10/16/2014: Hemoglobin 9.0*; Platelets 269 10/18/2014: BUN 32*; Creatinine, Ser 1.02*; Potassium 4.7; Sodium 133*  01/28/2015: BUN 42, creatinine 1.7, potassium 4.6  Other Studies Reviewed Today:  Echocardiogram 09/09/2014: Study Conclusions  - Left ventricle: The cavity size was normal. Wall thickness was normal. Systolic function was normal. The estimated ejection fraction was in the range of 50% to 55%. Wall motion was normal; there were no regional wall motion abnormalities. - Left atrium: The atrium was mildly dilated.  Assessment and Plan:  1. Chronic diastolic heart failure with LVEF 50-55%. She has lost 12 pounds of fluid weight, and we will plan to continue current diuretic regimen with follow-up BMET in 1 month for clinical visit. She does report improvement in terms of reduced output from her right sided Pleurx catheter. Hopefully with further volume management this may actually  be able to be removed. Otherwise pleurodesis may need to be considered since she has had recurring right-sided pleural effusion.  2. CKD stage 3, creatinine 1.7.  3. CAD status post CABG without active angina symptoms at this time.  Current medicines were reviewed with the patient today.   Orders Placed This Encounter  Procedures  . Basic Metabolic Panel (BMET)    Disposition: FU with me in [place] in 1 month.   Signed, Satira Sark, MD, [facility] 02/09/2015 1:38 PM    [place] [facility] [address] [place], [place] [address] Phone: [phone]; Fax: [phone]

## 2015-02-10 DIAGNOSIS — I2510 Atherosclerotic heart disease of native coronary artery without angina pectoris: Secondary | ICD-10-CM | POA: Diagnosis not present

## 2015-02-10 DIAGNOSIS — G20 Parkinson's disease: Secondary | ICD-10-CM | POA: Diagnosis not present

## 2015-02-10 DIAGNOSIS — I509 Heart failure, unspecified: Secondary | ICD-10-CM | POA: Diagnosis not present

## 2015-02-10 DIAGNOSIS — M5440 Lumbago with sciatica, unspecified side: Secondary | ICD-10-CM | POA: Diagnosis not present

## 2015-02-10 DIAGNOSIS — E1122 Type 2 diabetes mellitus with diabetic chronic kidney disease: Secondary | ICD-10-CM | POA: Diagnosis not present

## 2015-02-11 DIAGNOSIS — I872 Venous insufficiency (chronic) (peripheral): Secondary | ICD-10-CM | POA: Diagnosis not present

## 2015-02-11 DIAGNOSIS — I5043 Acute on chronic combined systolic (congestive) and diastolic (congestive) heart failure: Secondary | ICD-10-CM | POA: Diagnosis not present

## 2015-02-11 DIAGNOSIS — J90 Pleural effusion, not elsewhere classified: Secondary | ICD-10-CM | POA: Diagnosis not present

## 2015-02-11 DIAGNOSIS — I2510 Atherosclerotic heart disease of native coronary artery without angina pectoris: Secondary | ICD-10-CM | POA: Diagnosis not present

## 2015-02-11 DIAGNOSIS — I110 Hypertensive heart disease with heart failure: Secondary | ICD-10-CM | POA: Diagnosis not present

## 2015-02-11 DIAGNOSIS — E1140 Type 2 diabetes mellitus with diabetic neuropathy, unspecified: Secondary | ICD-10-CM | POA: Diagnosis not present

## 2015-02-12 ENCOUNTER — Encounter: Payer: Self-pay | Admitting: Vascular Surgery

## 2015-02-15 DIAGNOSIS — J449 Chronic obstructive pulmonary disease, unspecified: Secondary | ICD-10-CM | POA: Diagnosis not present

## 2015-02-15 DIAGNOSIS — J9600 Acute respiratory failure, unspecified whether with hypoxia or hypercapnia: Secondary | ICD-10-CM | POA: Diagnosis not present

## 2015-02-15 DIAGNOSIS — J9611 Chronic respiratory failure with hypoxia: Secondary | ICD-10-CM | POA: Diagnosis not present

## 2015-02-15 DIAGNOSIS — I10 Essential (primary) hypertension: Secondary | ICD-10-CM | POA: Diagnosis not present

## 2015-02-17 ENCOUNTER — Ambulatory Visit (HOSPITAL_COMMUNITY)
Admission: RE | Admit: 2015-02-17 | Discharge: 2015-02-17 | Disposition: A | Discharge status: Home / Self Care | Payer: Medicare Other | Source: Ambulatory Visit | Attending: Vascular Surgery | Admitting: Vascular Surgery

## 2015-02-17 ENCOUNTER — Ambulatory Visit (INDEPENDENT_AMBULATORY_CARE_PROVIDER_SITE_OTHER): Payer: Medicare Other | Admitting: Vascular Surgery

## 2015-02-17 ENCOUNTER — Encounter: Payer: Self-pay | Admitting: Vascular Surgery

## 2015-02-17 VITALS — Temp 98.5°F | Resp 16 | Ht 66.0 in | Wt 197.0 lb

## 2015-02-17 DIAGNOSIS — E782 Mixed hyperlipidemia: Secondary | ICD-10-CM | POA: Insufficient documentation

## 2015-02-17 DIAGNOSIS — I70735 Atherosclerosis of other type of bypass graft(s) of the right leg with ulceration of other part of foot: Secondary | ICD-10-CM | POA: Diagnosis not present

## 2015-02-17 DIAGNOSIS — R0989 Other specified symptoms and signs involving the circulatory and respiratory systems: Secondary | ICD-10-CM | POA: Insufficient documentation

## 2015-02-17 DIAGNOSIS — I2510 Atherosclerotic heart disease of native coronary artery without angina pectoris: Secondary | ICD-10-CM | POA: Diagnosis not present

## 2015-02-17 DIAGNOSIS — I739 Peripheral vascular disease, unspecified: Secondary | ICD-10-CM

## 2015-02-17 DIAGNOSIS — Z48812 Encounter for surgical aftercare following surgery on the circulatory system: Secondary | ICD-10-CM | POA: Diagnosis not present

## 2015-02-17 DIAGNOSIS — E119 Type 2 diabetes mellitus without complications: Secondary | ICD-10-CM | POA: Diagnosis not present

## 2015-02-17 DIAGNOSIS — I10 Essential (primary) hypertension: Secondary | ICD-10-CM | POA: Insufficient documentation

## 2015-02-17 NOTE — Progress Notes (Signed)
HISTORY AND PHYSICAL     CC:  F/u for left lower extremity bypass Referring Provider:  Glenda Chroman., MD  HPI: This is a 70 y.o. female who is s/p left femoral to popliteal bypass graft on 10/08/15 with popliteal endarterectomy by Dr. Scot Dock.  She has done well and does not have any pain in her left foot.  She states that she did bump her left 2nd toe on the tub and the nail is black, but this is getting better.  She states that she has a wound on the right 2nd toe that has gotten better.  She states that it was draining, but this has gotten better.   Aortogram done by Dr. Gwenlyn Found in August 2015 results as follows: 1: Abdominal aortogram-the distal abdominal aorta was free of significant disease  2: Left lower extremity-left SFA was occluded just beyond the origin of the profunda takeoff and reconstituted in the adductor canal. There was moderate popliteal disease with two-vessel runoff. The anterior tibial is occluded, the posterior tibial was diseased and the peroneal was the dominant vessel.  3: Right lower extremity-the right SFA had a 90% fairly focal stenosis the midportion followed by a 50% segmental stenosis just beyond that. There is 1 vessel runoff via the peroneal.  She has had a Pleurex catheter placed since her last visit.  She continues to be on home O2.   She is on a daily aspirin.  She is on a beta blocker, CCB and ACEI for blood pressure control.  She is on insulin for diabetes.    Past Medical History  Diagnosis Date  . Mixed hyperlipidemia   . Essential hypertension, benign   . Depression   . Cellulitis     a. Recurrent, bilateral legs  . Carotid artery disease (HCC)     a. Bilateral CEA; 50-60% bilateral ICA stenosis, 11/12  . PAD (peripheral artery disease) ([place])   . Suicide attempt ([place]) 08/2002  . TIA (transient ischemic attack)   . TMJ syndrome   . Herniated disc   . Ischemic cardiomyopathy     a. 11/2010 TEE: EF 40-45%.  . Critical lower limb ischemia   .  Venous insufficiency   . Coronary atherosclerosis of native coronary artery     a. 11/2010 NSTEMI/CABG x 4: L-LAD; S-PL; S-OM; S-DX, by PVT.  [name] NSTEMI (non-ST elevated myocardial infarction) ([place]) 11/2010  . Pneumonia 2000's X 2  . Type 2 diabetes mellitus ([place])   . History of blood transfusion   . Migraine   . Stroke ([place]) 08/2009  . Arthritis   . Chronic back pain   . On home oxygen therapy   . Chronic diastolic heart failure [facility])     Past Surgical History  Procedure Laterality Date  . Appendectomy    . Cholecystectomy    . Abdominal hysterectomy    . Carotid endarterectomy Bilateral 2011    Bilateral - Dr. Kellie Simmering  . Wrist fracture surgery Left     "grafted bone from hip to wrist"  . Coronary artery bypass graft  11/24/2010    Procedure: CORONARY ARTERY BYPASS GRAFTING (CABG);  Surgeon: Tharon Aquas Adelene Idler, MD;  Location: [facility];  Service: Open Heart Surgery;  Laterality: N/A;  Coronary Artery Bypass Graft times four on pump utilizing left internal mammary artery and bilateral greater saphenous veins harvested endoscopically, transesophageal echocardiogram   . Toe surgery Left     "put pin in 2nd toe"  . Lower extremity angiogram  09/08/2012;  10/06/2013    "found 100% blockage; unsuccessful attempt at crossing a chronic total occlusion of the left SFA in the setting of critical limb ischemia  . Dilation and curettage of uterus    . Tubal ligation    . Debridement toe Left     "nonhealing wound; 3rd digit  . Cardiac catheterization  11/2010  . Coronary angioplasty with stent placement  03/2013    "1"  . Colonoscopy w/ biopsies and polypectomy    . Fracture surgery    . Femoral-popliteal bypass graft Left 10/07/2013    Procedure: LEFT FEMORAL-POPLITEAL ARTERY BYPASS GRAFT;  Surgeon: Angelia Mould, MD;  Location: [place];  Service: Vascular;  Laterality: Left;  . Intraoperative arteriogram Left 10/07/2013    Procedure: INTRA OPERATIVE ARTERIOGRAM LEFT LEG;  Surgeon:  Angelia Mould, MD;  Location: [place];  Service: Vascular;  Laterality: Left;  . Endarterectomy popliteal Left 10/07/2013    Procedure: LEFT POPLITEAL ENDARTERECTOMY ;  Surgeon: Angelia Mould, MD;  Location: [place];  Service: Vascular;  Laterality: Left;  . Left and right heart catheterization with coronary/graft angiogram N/A 03/27/2013    Procedure: LEFT AND RIGHT HEART CATHETERIZATION WITH Beatrix Fetters;  Surgeon: Burnell Blanks, MD;  Location[facility] CATH LAB;  Service: Cardiovascular;  Laterality: N/A;  . Lower extremity angiogram Bilateral 09/08/2013    Procedure: LOWER EXTREMITY ANGIOGRAM;  Surgeon: [name], MD;  Location: [facility] C[facility] LAB;  Service: Cardiovascular;  Laterality: Bilateral;    Allergies  Allergen Reactions  . Other Shortness Of Breath, Rash and Other (See Comments)    All berries   . Strawberry Extract Hives, Shortness Of Breath and Rash  . Sulfa Antibiotics Swelling    Bodily Swelling  . Tape Other (See Comments)    Tears skin.  Please use "paper" tape only.    Current Outpatient Prescriptions  Medication Sig Dispense Refill  . acetaminophen (TYLENOL) 500 MG tablet Take 1,000 mg by mouth every 6 (six) hours as needed.    [name] albuterol (PROVENTIL HFA;VENTOLIN HFA) 108 (90 BASE) MCG/ACT inhaler Inhale 2 puffs into the lungs daily as needed for wheezing or shortness of breath.     [name] amLODipine (NORVASC) 10 MG tablet Take 1 tablet (10 mg total) by mouth daily. 90 tablet 3  . aspirin 81 MG EC tablet Take 81 mg by mouth daily.    [name] atorvastatin (LIPITOR) 40 MG tablet Take 40 mg by mouth daily at 6 PM.    . carvedilol (COREG) 6.25 MG tablet Take 1 tablet (6.25 mg total) by mouth 2 (two) times daily. 180 tablet 3  . citalopram (CELEXA) 20 MG tablet Take 20 mg by mouth daily.    . clopidogrel (PLAVIX) 75 MG tablet Take 1 tablet (75 mg total) by mouth daily with breakfast. 30 tablet 9  . Docusate Calcium (STOOL SOFTENER PO) Take 100 mg by mouth  daily as needed (for constipation).     . gabapentin (NEURONTIN) 300 MG capsule Take 300 mg by mouth daily.    [name] glimepiride (AMARYL) 2 MG tablet Take 2 mg by mouth 2 (two) times daily.     . hydrALAZINE (APRESOLINE) 25 MG tablet Take 25 mg by mouth 3 (three) times daily.    [name] HYDROcodone-acetaminophen (NORCO/VICODIN) 5-325 MG tablet Take 1 tablet by mouth every 6 (six) hours as needed for moderate pain. 30 tablet 0  . insulin aspart (NOVOLOG) 100 UNIT/ML injection Inject 3 Units into the skin 3 (three) times daily  with meals. 10 mL 11  . insulin detemir (LEVEMIR) 100 UNIT/ML injection Inject 0.07 mLs (7 Units total) into the skin at bedtime. 10 mL 11  . isosorbide mononitrate (IMDUR) 30 MG 24 hr tablet Take 1.5 tablets (45 mg total) by mouth daily.    [name] lisinopril (PRINIVIL,ZESTRIL) 10 MG tablet Take 1 tablet (10 mg total) by mouth daily. 90 tablet 3  . metolazone (ZAROXOLYN) 2.5 MG tablet Take 1 tablet (2.5 mg total) by mouth as directed. For the next few days; If your weight comes down 5-6 lbs over weekend, stop metolazone and go back to torsemide 40 mg twice daily 30 tablet 0  . Multiple Vitamin (MULTIVITAMIN WITH MINERALS) TABS tablet Take 1 tablet by mouth daily.    . nitroGLYCERIN (NITROSTAT) 0.4 MG SL tablet Place 1 tablet (0.4 mg total) under the tongue every 5 (five) minutes as needed for chest pain. Up to 3 doses. If no relief after 3rd dose, proceed to the ED for an evaluation 25 tablet 3  . potassium chloride (K-DUR) 10 MEQ tablet Take 1 tablet (10 mEq total) by mouth every other day. 45 tablet 3  . torsemide (DEMADEX) 20 MG tablet Take 2-3 tablets (40-60 mg total) by mouth as directed. Take 60 mg in the morning and 40 mg in the evening. If your weight comes back down 5-6 lbs over weekend, then resume 40 mg twice daily 70 tablet 1  . Umeclidinium-Vilanterol 62.5-25 MCG/INH AEPB Inhale 1 application into the lungs daily.     . carbidopa-levodopa (SINEMET IR) 25-100 MG tablet Take 1 tablet  by mouth 3 (three) times daily. Reported on 02/17/2015     No current facility-administered medications for this visit.    Family History  Problem Relation Age of Onset  . Coronary artery disease Father     Died with MI age 34  . Heart disease Father     before age 80  . Heart attack Father   . Diabetes type II Mother   . Hypertension Mother   . Diabetes Mother   . Heart disease Mother   . Hyperlipidemia Mother   . Heart failure Sister   . Cancer Sister   . Cancer Brother     Lung cancer  . Diabetes Daughter   . Hyperlipidemia Daughter   . Diabetes Son   . Hyperlipidemia Son     Social History   Social History  . Marital Status: Widowed    Spouse Name: N/A  . Number of Children: N/A  . Years of Education: N/A   Occupational History  . Not on file.   Social History Main Topics  . Smoking status: Former Smoker -- 3.00 packs/day for 50 years    Types: Cigarettes    Start date: 01/24/1956    Quit date: 10/09/2009  . Smokeless tobacco: Never Used  . Alcohol Use: 0.0 oz/week    0 Standard drinks or equivalent per week     Comment: 10/06/2013 "might have a drink q 2-3 months"  . Drug Use: Yes    Special: Marijuana     Comment: "smoked pot in my teens"  . Sexual Activity: Not Currently   Other Topics Concern  . Not on file   Social History Narrative   Lives in [place] by herself.  She does not work.     REVIEW OF SYSTEMS:   _0  denotes positive finding, _1  denotes negative finding Cardiac  Comments:  Chest pain or chest pressure:  Shortness of breath upon exertion:    Short of breath when lying flat:    Irregular heart rhythm:        Vascular    Pain in calf, thigh, or hip brought on by ambulation:    Pain in feet at night that wakes you up from your sleep:     Blood clot in your veins:    Leg swelling:         Pulmonary    Oxygen at home: x   Productive cough:         Wheezing:         Neurologic    Sudden weakness in arms or legs:     Sudden  numbness in arms or legs:     Sudden onset of difficulty speaking or slurred speech:    Temporary loss of vision in one eye:     Problems with dizziness:         Gastrointestinal    Blood in stool:     Vomited blood:         Genitourinary    Burning when urinating:     Blood in urine:        Psychiatric    Major depression:         Hematologic    Bleeding problems:    Problems with blood clotting too easily:        Skin    Rashes or ulcers:        Constitutional    Fever or chills:      PHYSICAL EXAMINATION:  Filed Vitals:   02/17/15 1458  Temp: 98.5 F (36.9 C)  Resp: 16   Body mass index is 31.81 kg/(m^2).  General:  WDWN in NAD; vital signs documented above Gait: Not observed HENT: WNL, normocephalic Pulmonary: normal non-labored breathing , without Rales, rhonchi,  wheezing Cardiac: regular HR, without  Murmurs, rubs or gallops; without carotid bruits Abdomen: soft, NT, no masses Skin: without rashes Vascular Exam/Pulses:  Right Left  Radial 2+ (normal) 2+ (normal)  Femoral 2+ (normal) Unable to palpate due to body habitus   Popliteal Unable to palpate  Unable to palpate   DP Unable to palpate  2+ (normal)  PT Unable to palpate  Unable to palpate    Extremities: wound right 2nd toe with dried blood present.  No odor or drainage present Musculoskeletal: no muscle wasting or atrophy  Neurologic: A&O X 3;  No focal weakness or paresthesias are detected Psychiatric:  The pt has Normal affect.   Non-Invasive Vascular Imaging:   ABI's 02/17/15: Right:  0.53 Left:  1.02  Previous ABI's 11/11/14: Right:  0.58 Left:  1.11  Pt meds includes: Statin:  Yes.   Beta Blocker:  Yes.   Aspirin:  Yes.   ACEI:  Yes.   ARB:  No. Other Antiplatelet/Anticoagulant:  Yes.   Plavix   ASSESSMENT/PLAN:: 70 y.o. female with hx of left femoral to popliteal bypass grafting with PTFE and popliteal endarterectomy on 10/07/13 by Dr. Scot Dock presents today for follow  up.   -pt doing well and not having any rest pain. -she does have a wound on her right 2nd toe that has been present for a while, but pt and daughter state it is improving.  Arteriogram by Dr. Gwenlyn Found revealed right SFA had a 90% fairly focal stenosis the midportion followed by a 50% segmental stenosis just beyond that. There is 1 vessel runoff via the peroneal.  She does have an  appointment with Dr. Gwenlyn Found that she will keep.  He may decide that he wants to repeat the arteriogram of the right leg to see if there are any lesions amendable to intervention to help blood flow to help heal her right 2nd toe wound.  -hx of carotid endarterectomy-Dr. Gwenlyn Found following carotid duplex -we will see the pt back in one year for duplex of bypass graft.  Dr. Gwenlyn Found will continue to follow ABI's. -she will contact us sooner if she needs [place].   Leontine Locket, PA-C [facility] [phone]  Clinic MD:  Pt seen and examined in conjunction with Dr. Scot Dock

## 2015-02-17 NOTE — Addendum Note (Signed)
Addended by: Dorthula Rue L on: 02/17/2015 05:27 PM   Modules accepted: Orders

## 2015-02-23 DIAGNOSIS — E1140 Type 2 diabetes mellitus with diabetic neuropathy, unspecified: Secondary | ICD-10-CM | POA: Diagnosis not present

## 2015-02-23 DIAGNOSIS — I110 Hypertensive heart disease with heart failure: Secondary | ICD-10-CM | POA: Diagnosis not present

## 2015-02-23 DIAGNOSIS — I872 Venous insufficiency (chronic) (peripheral): Secondary | ICD-10-CM | POA: Diagnosis not present

## 2015-02-23 DIAGNOSIS — J90 Pleural effusion, not elsewhere classified: Secondary | ICD-10-CM | POA: Diagnosis not present

## 2015-02-23 DIAGNOSIS — I5043 Acute on chronic combined systolic (congestive) and diastolic (congestive) heart failure: Secondary | ICD-10-CM | POA: Diagnosis not present

## 2015-02-23 DIAGNOSIS — I2510 Atherosclerotic heart disease of native coronary artery without angina pectoris: Secondary | ICD-10-CM | POA: Diagnosis not present

## 2015-03-02 DIAGNOSIS — M545 Low back pain: Secondary | ICD-10-CM | POA: Diagnosis not present

## 2015-03-02 DIAGNOSIS — M9903 Segmental and somatic dysfunction of lumbar region: Secondary | ICD-10-CM | POA: Diagnosis not present

## 2015-03-02 DIAGNOSIS — M9901 Segmental and somatic dysfunction of cervical region: Secondary | ICD-10-CM | POA: Diagnosis not present

## 2015-03-02 DIAGNOSIS — S134XXA Sprain of ligaments of cervical spine, initial encounter: Secondary | ICD-10-CM | POA: Diagnosis not present

## 2015-03-02 DIAGNOSIS — M546 Pain in thoracic spine: Secondary | ICD-10-CM | POA: Diagnosis not present

## 2015-03-02 DIAGNOSIS — M47812 Spondylosis without myelopathy or radiculopathy, cervical region: Secondary | ICD-10-CM | POA: Diagnosis not present

## 2015-03-02 DIAGNOSIS — S338XXA Sprain of other parts of lumbar spine and pelvis, initial encounter: Secondary | ICD-10-CM | POA: Diagnosis not present

## 2015-03-02 DIAGNOSIS — M47816 Spondylosis without myelopathy or radiculopathy, lumbar region: Secondary | ICD-10-CM | POA: Diagnosis not present

## 2015-03-02 DIAGNOSIS — M9902 Segmental and somatic dysfunction of thoracic region: Secondary | ICD-10-CM | POA: Diagnosis not present

## 2015-03-02 DIAGNOSIS — S43401A Unspecified sprain of right shoulder joint, initial encounter: Secondary | ICD-10-CM | POA: Diagnosis not present

## 2015-03-02 DIAGNOSIS — M4004 Postural kyphosis, thoracic region: Secondary | ICD-10-CM | POA: Diagnosis not present

## 2015-03-03 DIAGNOSIS — I272 Other secondary pulmonary hypertension: Secondary | ICD-10-CM | POA: Diagnosis not present

## 2015-03-03 DIAGNOSIS — I872 Venous insufficiency (chronic) (peripheral): Secondary | ICD-10-CM | POA: Diagnosis not present

## 2015-03-03 DIAGNOSIS — I5043 Acute on chronic combined systolic (congestive) and diastolic (congestive) heart failure: Secondary | ICD-10-CM | POA: Diagnosis not present

## 2015-03-03 DIAGNOSIS — E1140 Type 2 diabetes mellitus with diabetic neuropathy, unspecified: Secondary | ICD-10-CM | POA: Diagnosis not present

## 2015-03-03 DIAGNOSIS — I2510 Atherosclerotic heart disease of native coronary artery without angina pectoris: Secondary | ICD-10-CM | POA: Diagnosis not present

## 2015-03-03 DIAGNOSIS — J90 Pleural effusion, not elsewhere classified: Secondary | ICD-10-CM | POA: Diagnosis not present

## 2015-03-03 DIAGNOSIS — I110 Hypertensive heart disease with heart failure: Secondary | ICD-10-CM | POA: Diagnosis not present

## 2015-03-04 DIAGNOSIS — M4004 Postural kyphosis, thoracic region: Secondary | ICD-10-CM | POA: Diagnosis not present

## 2015-03-04 DIAGNOSIS — M47812 Spondylosis without myelopathy or radiculopathy, cervical region: Secondary | ICD-10-CM | POA: Diagnosis not present

## 2015-03-04 DIAGNOSIS — S134XXA Sprain of ligaments of cervical spine, initial encounter: Secondary | ICD-10-CM | POA: Diagnosis not present

## 2015-03-04 DIAGNOSIS — M9902 Segmental and somatic dysfunction of thoracic region: Secondary | ICD-10-CM | POA: Diagnosis not present

## 2015-03-04 DIAGNOSIS — M47816 Spondylosis without myelopathy or radiculopathy, lumbar region: Secondary | ICD-10-CM | POA: Diagnosis not present

## 2015-03-04 DIAGNOSIS — M546 Pain in thoracic spine: Secondary | ICD-10-CM | POA: Diagnosis not present

## 2015-03-04 DIAGNOSIS — M545 Low back pain: Secondary | ICD-10-CM | POA: Diagnosis not present

## 2015-03-04 DIAGNOSIS — M9901 Segmental and somatic dysfunction of cervical region: Secondary | ICD-10-CM | POA: Diagnosis not present

## 2015-03-04 DIAGNOSIS — M9903 Segmental and somatic dysfunction of lumbar region: Secondary | ICD-10-CM | POA: Diagnosis not present

## 2015-03-04 DIAGNOSIS — S338XXA Sprain of other parts of lumbar spine and pelvis, initial encounter: Secondary | ICD-10-CM | POA: Diagnosis not present

## 2015-03-04 DIAGNOSIS — S43401A Unspecified sprain of right shoulder joint, initial encounter: Secondary | ICD-10-CM | POA: Diagnosis not present

## 2015-03-08 DIAGNOSIS — M4004 Postural kyphosis, thoracic region: Secondary | ICD-10-CM | POA: Diagnosis not present

## 2015-03-08 DIAGNOSIS — S338XXA Sprain of other parts of lumbar spine and pelvis, initial encounter: Secondary | ICD-10-CM | POA: Diagnosis not present

## 2015-03-08 DIAGNOSIS — S43401A Unspecified sprain of right shoulder joint, initial encounter: Secondary | ICD-10-CM | POA: Diagnosis not present

## 2015-03-08 DIAGNOSIS — M47816 Spondylosis without myelopathy or radiculopathy, lumbar region: Secondary | ICD-10-CM | POA: Diagnosis not present

## 2015-03-08 DIAGNOSIS — M546 Pain in thoracic spine: Secondary | ICD-10-CM | POA: Diagnosis not present

## 2015-03-08 DIAGNOSIS — M9902 Segmental and somatic dysfunction of thoracic region: Secondary | ICD-10-CM | POA: Diagnosis not present

## 2015-03-08 DIAGNOSIS — M9903 Segmental and somatic dysfunction of lumbar region: Secondary | ICD-10-CM | POA: Diagnosis not present

## 2015-03-08 DIAGNOSIS — S134XXA Sprain of ligaments of cervical spine, initial encounter: Secondary | ICD-10-CM | POA: Diagnosis not present

## 2015-03-08 DIAGNOSIS — M545 Low back pain: Secondary | ICD-10-CM | POA: Diagnosis not present

## 2015-03-08 DIAGNOSIS — M9901 Segmental and somatic dysfunction of cervical region: Secondary | ICD-10-CM | POA: Diagnosis not present

## 2015-03-08 DIAGNOSIS — M47812 Spondylosis without myelopathy or radiculopathy, cervical region: Secondary | ICD-10-CM | POA: Diagnosis not present

## 2015-03-09 DIAGNOSIS — I110 Hypertensive heart disease with heart failure: Secondary | ICD-10-CM | POA: Diagnosis not present

## 2015-03-09 DIAGNOSIS — I10 Essential (primary) hypertension: Secondary | ICD-10-CM | POA: Diagnosis not present

## 2015-03-09 DIAGNOSIS — I872 Venous insufficiency (chronic) (peripheral): Secondary | ICD-10-CM | POA: Diagnosis not present

## 2015-03-09 DIAGNOSIS — E1140 Type 2 diabetes mellitus with diabetic neuropathy, unspecified: Secondary | ICD-10-CM | POA: Diagnosis not present

## 2015-03-09 DIAGNOSIS — J90 Pleural effusion, not elsewhere classified: Secondary | ICD-10-CM | POA: Diagnosis not present

## 2015-03-09 DIAGNOSIS — I2510 Atherosclerotic heart disease of native coronary artery without angina pectoris: Secondary | ICD-10-CM | POA: Diagnosis not present

## 2015-03-09 DIAGNOSIS — I5043 Acute on chronic combined systolic (congestive) and diastolic (congestive) heart failure: Secondary | ICD-10-CM | POA: Diagnosis not present

## 2015-03-11 ENCOUNTER — Telehealth: Payer: Self-pay | Admitting: *Deleted

## 2015-03-11 DIAGNOSIS — M47812 Spondylosis without myelopathy or radiculopathy, cervical region: Secondary | ICD-10-CM | POA: Diagnosis not present

## 2015-03-11 DIAGNOSIS — M545 Low back pain: Secondary | ICD-10-CM | POA: Diagnosis not present

## 2015-03-11 DIAGNOSIS — M9901 Segmental and somatic dysfunction of cervical region: Secondary | ICD-10-CM | POA: Diagnosis not present

## 2015-03-11 DIAGNOSIS — S134XXA Sprain of ligaments of cervical spine, initial encounter: Secondary | ICD-10-CM | POA: Diagnosis not present

## 2015-03-11 DIAGNOSIS — S43401A Unspecified sprain of right shoulder joint, initial encounter: Secondary | ICD-10-CM | POA: Diagnosis not present

## 2015-03-11 DIAGNOSIS — M9902 Segmental and somatic dysfunction of thoracic region: Secondary | ICD-10-CM | POA: Diagnosis not present

## 2015-03-11 DIAGNOSIS — M47816 Spondylosis without myelopathy or radiculopathy, lumbar region: Secondary | ICD-10-CM | POA: Diagnosis not present

## 2015-03-11 DIAGNOSIS — M4004 Postural kyphosis, thoracic region: Secondary | ICD-10-CM | POA: Diagnosis not present

## 2015-03-11 DIAGNOSIS — M546 Pain in thoracic spine: Secondary | ICD-10-CM | POA: Diagnosis not present

## 2015-03-11 DIAGNOSIS — S338XXA Sprain of other parts of lumbar spine and pelvis, initial encounter: Secondary | ICD-10-CM | POA: Diagnosis not present

## 2015-03-11 DIAGNOSIS — M9903 Segmental and somatic dysfunction of lumbar region: Secondary | ICD-10-CM | POA: Diagnosis not present

## 2015-03-11 NOTE — Telephone Encounter (Signed)
-----  Message from Satira Sark, MD sent at 03/10/2015 11:24 AM EST ----- Reviewed. Creatinine 1.0 and potassium normal. Continue same diuretic regimen for now.

## 2015-03-11 NOTE — Telephone Encounter (Signed)
Patient informed.

## 2015-03-13 ENCOUNTER — Other Ambulatory Visit: Payer: Self-pay | Admitting: Cardiology

## 2015-03-15 DIAGNOSIS — I110 Hypertensive heart disease with heart failure: Secondary | ICD-10-CM | POA: Diagnosis not present

## 2015-03-15 DIAGNOSIS — I872 Venous insufficiency (chronic) (peripheral): Secondary | ICD-10-CM | POA: Diagnosis not present

## 2015-03-15 DIAGNOSIS — E1140 Type 2 diabetes mellitus with diabetic neuropathy, unspecified: Secondary | ICD-10-CM | POA: Diagnosis not present

## 2015-03-15 DIAGNOSIS — I5043 Acute on chronic combined systolic (congestive) and diastolic (congestive) heart failure: Secondary | ICD-10-CM | POA: Diagnosis not present

## 2015-03-15 DIAGNOSIS — I2510 Atherosclerotic heart disease of native coronary artery without angina pectoris: Secondary | ICD-10-CM | POA: Diagnosis not present

## 2015-03-15 DIAGNOSIS — J90 Pleural effusion, not elsewhere classified: Secondary | ICD-10-CM | POA: Diagnosis not present

## 2015-03-16 DIAGNOSIS — G20 Parkinson's disease: Secondary | ICD-10-CM | POA: Diagnosis not present

## 2015-03-16 DIAGNOSIS — Z299 Encounter for prophylactic measures, unspecified: Secondary | ICD-10-CM | POA: Diagnosis not present

## 2015-03-16 DIAGNOSIS — I509 Heart failure, unspecified: Secondary | ICD-10-CM | POA: Diagnosis not present

## 2015-03-16 DIAGNOSIS — M5440 Lumbago with sciatica, unspecified side: Secondary | ICD-10-CM | POA: Diagnosis not present

## 2015-03-19 ENCOUNTER — Ambulatory Visit (INDEPENDENT_AMBULATORY_CARE_PROVIDER_SITE_OTHER): Payer: Medicare Other | Admitting: Cardiology

## 2015-03-19 ENCOUNTER — Encounter: Payer: Self-pay | Admitting: Cardiology

## 2015-03-19 VITALS — BP 109/66 | HR 46 | Ht 66.0 in | Wt 196.2 lb

## 2015-03-19 DIAGNOSIS — J90 Pleural effusion, not elsewhere classified: Secondary | ICD-10-CM

## 2015-03-19 DIAGNOSIS — R001 Bradycardia, unspecified: Secondary | ICD-10-CM

## 2015-03-19 DIAGNOSIS — I2510 Atherosclerotic heart disease of native coronary artery without angina pectoris: Secondary | ICD-10-CM | POA: Diagnosis not present

## 2015-03-19 DIAGNOSIS — J9611 Chronic respiratory failure with hypoxia: Secondary | ICD-10-CM | POA: Diagnosis not present

## 2015-03-19 DIAGNOSIS — I5032 Chronic diastolic (congestive) heart failure: Secondary | ICD-10-CM | POA: Diagnosis not present

## 2015-03-19 DIAGNOSIS — I739 Peripheral vascular disease, unspecified: Secondary | ICD-10-CM

## 2015-03-19 MED ORDER — CARVEDILOL 3.125 MG PO TABS: 3.1250 mg | ORAL_TABLET | Freq: Two times a day (BID) | ORAL | Status: DC

## 2015-03-19 NOTE — Progress Notes (Signed)
Cardiology Office Note  Date: 03/19/2015   ID: [name] [name], DOB 08/31/1945, MRN [id number]  PCP: Glenda Chroman., MD  Primary Cardiologist: Rozann Lesches, MD   Chief Complaint  Patient presents with  . Diastolic heart failure  . Coronary Artery Disease    History of Present Illness: [name] [name] is a medically complex 70 y.o. female last seen in January. She is here with her daughter for a follow-up visit. Overall, doing better with reported stable weight at home in the mid 180s by her scale. She has been cutting back salt in her diet. He also states that drainage from the Pleurx catheter has tailed off significantly, now down to about 100 cc every 3 or 4 days. She will be seeing Dr. Luan Pulling in the next few weeks, I am hopeful that the catheter will be able to be discontinued ultimately.  Chart reviewed, patient was seen by Dr. Scot Dock in February. I reviewed the note.  We went over her medications. She continues on aspirin, Norvasc, Lipitor, Coreg, Plavix, hydralazine , Imdur, lisinopril, Imitrex, and as needed metolazone. She felt better after we decreased her Coreg dose, still remains bradycardic  I reviewed her follow-up lab work showing relatively stable renal function.  Past Medical History  Diagnosis Date  . Mixed hyperlipidemia   . Essential hypertension, benign   . Depression   . Cellulitis     a. Recurrent, bilateral legs  . Carotid artery disease (HCC)     a. Bilateral CEA; 50-60% bilateral ICA stenosis, 11/12  . PAD (peripheral artery disease) ([place])   . Suicide attempt ([place]) 08/2002  . TIA (transient ischemic attack)   . TMJ syndrome   . Herniated disc   . Ischemic cardiomyopathy     a. 11/2010 TEE: EF 40-45%.  . Critical lower limb ischemia   . Venous insufficiency   . Coronary atherosclerosis of native coronary artery     a. 11/2010 NSTEMI/CABG x 4: L-LAD; S-PL; S-OM; S-DX, by PVT.  [name] NSTEMI (non-ST elevated myocardial infarction) ([place]) 11/2010  .  Pneumonia 2000's X 2  . Type 2 diabetes mellitus ([place])   . History of blood transfusion   . Migraine   . Stroke ([place]) 08/2009  . Arthritis   . Chronic back pain   . On home oxygen therapy   . Chronic diastolic heart failure [facility])     Past Surgical History  Procedure Laterality Date  . Appendectomy    . Cholecystectomy    . Abdominal hysterectomy    . Carotid endarterectomy Bilateral 2011    Bilateral - Dr. Kellie Simmering  . Wrist fracture surgery Left     "grafted bone from hip to wrist"  . Coronary artery bypass graft  11/24/2010    Procedure: CORONARY ARTERY BYPASS GRAFTING (CABG);  Surgeon: Tharon Aquas Adelene Idler, MD;  Location: [place];  Service: Open Heart Surgery;  Laterality: N/A;  Coronary Artery Bypass Graft times four on pump utilizing left internal mammary artery and bilateral greater saphenous veins harvested endoscopically, transesophageal echocardiogram   . Toe surgery Left     "put pin in 2nd toe"  . Lower extremity angiogram  09/08/2012; 10/06/2013    "found 100% blockage; unsuccessful attempt at crossing a chronic total occlusion of the left SFA in the setting of critical limb ischemia  . Dilation and curettage of uterus    . Tubal ligation    . Debridement toe Left     "nonhealing wound; 3rd digit  .  Cardiac catheterization  11/2010  . Coronary angioplasty with stent placement  03/2013    "1"  . Colonoscopy w/ biopsies and polypectomy    . Fracture surgery    . Femoral-popliteal bypass graft Left 10/07/2013    Procedure: LEFT FEMORAL-POPLITEAL ARTERY BYPASS GRAFT;  Surgeon: Angelia Mould, MD;  Location: [place];  Service: Vascular;  Laterality: Left;  . Intraoperative arteriogram Left 10/07/2013    Procedure: INTRA OPERATIVE ARTERIOGRAM LEFT LEG;  Surgeon: Angelia Mould, MD;  Location: [place];  Service: Vascular;  Laterality: Left;  . Endarterectomy popliteal Left 10/07/2013    Procedure: LEFT POPLITEAL ENDARTERECTOMY ;  Surgeon: Angelia Mould, MD;   Location: [place];  Service: Vascular;  Laterality: Left;  . Left and right heart catheterization with coronary/graft angiogram N/A 03/27/2013    Procedure: LEFT AND RIGHT HEART CATHETERIZATION WITH Beatrix Fetters;  Surgeon: Burnell Blanks, MD;  Location: [facility] CATH LAB;  Service: Cardiovascular;  Laterality: N/A;  . Lower extremity angiogram Bilateral 09/08/2013    Procedure: LOWER EXTREMITY ANGIOGRAM;  Surgeon: [name], MD;  Location: [facility] CATH LAB;  Service: Cardiovascular;  Laterality: Bilateral;    Current Outpatient Prescriptions  Medication Sig Dispense Refill  . acetaminophen (TYLENOL) 500 MG tablet Take 1,000 mg by mouth every 6 (six) hours as needed.    [name] albuterol (PROVENTIL HFA;VENTOLIN HFA) 108 (90 BASE) MCG/ACT inhaler Inhale 2 puffs into the lungs daily as needed for wheezing or shortness of breath.     [name] amLODipine (NORVASC) 10 MG tablet Take 1 tablet (10 mg total) by mouth daily. 90 tablet 3  . aspirin 81 MG EC tablet Take 81 mg by mouth daily.    [name] atorvastatin (LIPITOR) 40 MG tablet Take 40 mg by mouth daily at 6 PM.    . citalopram (CELEXA) 20 MG tablet Take 20 mg by mouth daily.    . clopidogrel (PLAVIX) 75 MG tablet Take 1 tablet (75 mg total) by mouth daily with breakfast. 30 tablet 9  . Docusate Calcium (STOOL SOFTENER PO) Take 100 mg by mouth daily as needed (for constipation).     . gabapentin (NEURONTIN) 300 MG capsule Take 300 mg by mouth daily.    [name] glimepiride (AMARYL) 2 MG tablet Take 2 mg by mouth 2 (two) times daily.     . hydrALAZINE (APRESOLINE) 25 MG tablet Take 25 mg by mouth 3 (three) times daily.    [name] HYDROcodone-acetaminophen (NORCO/VICODIN) 5-325 MG tablet Take 1 tablet by mouth every 6 (six) hours as needed for moderate pain. 30 tablet 0  . insulin aspart (NOVOLOG) 100 UNIT/ML injection Inject 3 Units into the skin 3 (three) times daily with meals. 10 mL 11  . insulin detemir (LEVEMIR) 100 UNIT/ML injection Inject 0.07 mLs (7 Units  total) into the skin at bedtime. 10 mL 11  . isosorbide mononitrate (IMDUR) 30 MG 24 hr tablet Take 1.5 tablets (45 mg total) by mouth daily.    . isosorbide mononitrate (IMDUR) 30 MG 24 hr tablet TAKE ONE TABLET BY MOUTH ONCE DAILY 30 tablet 3  . lisinopril (PRINIVIL,ZESTRIL) 10 MG tablet Take 1 tablet (10 mg total) by mouth daily. 90 tablet 3  . metolazone (ZAROXOLYN) 2.5 MG tablet Take 1 tablet (2.5 mg total) by mouth as directed. For the next few days; If your weight comes down 5-6 lbs over weekend, stop metolazone and go back to torsemide 40 mg twice daily 30 tablet 0  . Multiple Vitamin (MULTIVITAMIN WITH MINERALS)  TABS tablet Take 1 tablet by mouth daily.    . nitroGLYCERIN (NITROSTAT) 0.4 MG SL tablet Place 1 tablet (0.4 mg total) under the tongue every 5 (five) minutes as needed for chest pain. Up to 3 doses. If no relief after 3rd dose, proceed to the ED for an evaluation 25 tablet 3  . potassium chloride (K-DUR) 10 MEQ tablet Take 1 tablet (10 mEq total) by mouth every other day. 45 tablet 3  . torsemide (DEMADEX) 20 MG tablet Take 2-3 tablets (40-60 mg total) by mouth as directed. Take 60 mg in the morning and 40 mg in the evening. If your weight comes back down 5-6 lbs over weekend, then resume 40 mg twice daily 70 tablet 1  . Umeclidinium-Vilanterol 62.5-25 MCG/INH AEPB Inhale 1 application into the lungs daily.     . carvedilol (COREG) 3.125 MG tablet Take 1 tablet (3.125 mg total) by mouth 2 (two) times daily with a meal. 60 tablet 3   No current facility-administered medications for this visit.   Allergies:  Other; Strawberry extract; Sulfa antibiotics; and Tape   Social History: The patient  reports that she quit smoking about 5 years ago. Her smoking use included Cigarettes. She started smoking about 59 years ago. She has a 150 pack-year smoking history. She has never used smokeless tobacco. She reports that she drinks alcohol. She reports that she uses illicit drugs (Marijuana).     ROS:  Please see the history of present illness. Otherwise, complete review of systems is positive for  Recent fall with bruised right shoulder but no major injury. States that she lost her balance, no syncope. Uses a rolling walker..  All other systems are reviewed and negative.   Physical Exam: VS:  BP 109/66 mmHg  Pulse 46  Ht _0  (1.676 m)  Wt 196 lb 3.2 oz (88.996 kg)  BMI 31.68 kg/m2  SpO2 92%, BMI Body mass index is 31.68 kg/(m^2).  Wt Readings from Last 3 Encounters:  03/19/15 196 lb 3.2 oz (88.996 kg)  02/17/15 197 lb (89.359 kg)  02/09/15 190 lb (86.183 kg)    Chronically ill-appearing woman, no distress. Wearing oxygen. HEENT: Conjunctiva and lids normal, oropharynx clear. Neck: Supple, no elevated JVP, bilateral CEA scars, no thyromegaly. Lungs: Clear to auscultation with decreased breath sounds at the right lower zone, Pleurx catheter in place, nonlabored breathing at rest. Cardiac: Regular rate and rhythm, no S3, soft systolic murmur, no pericardial rub. Abdomen: Soft, protuberant, bowel sounds present, no guarding or rebound. Extremities: 1+ edema and stasis, distal pulses 1+.  ECG: I personally reviewed the prior tracing from 10/12/2014 which showed sinus bradycardia with sinus arrhythmia and IVCD.  Recent Labwork: 10/10/2014: B Natriuretic Peptide 914.0* 10/12/2014: ALT 23; AST 24; Magnesium 2.1; TSH 2.210 10/16/2014: Hemoglobin 9.0*; Platelets 269 10/18/2014: BUN 32*; Creatinine, Ser 1.02*; Potassium 4.7; Sodium 133*     Component Value Date/Time   CHOL 114 10/13/2010 0401   TRIG 96 10/13/2010 0401   HDL 31* 10/13/2010 0401   CHOLHDL 3.7 10/13/2010 0401   VLDL 19 10/13/2010 0401   LDLCALC 64 10/13/2010 0401  February 2017: BUN 36, creatinine 1.1, potassium 3.9  Other Studies Reviewed Today:  Echocardiogram 09/09/2014: Study Conclusions  - Left ventricle: The cavity size was normal. Wall thickness was normal. Systolic function was normal. The  estimated ejection fraction was in the range of 50% to 55%. Wall motion was normal; there were no regional wall motion abnormalities. - Left atrium: The atrium was  mildly dilated.  Assessment and Plan:  1. Chronic diastolic heart failure, relatively stable with no major change in weight. She has cut back sodium in her diet and at this point seems to be on a good dosing regimen for her diuretics. Follow-up creatinine 1.1.  I made no changes today.  2. Recurrent right pleural effusion. Drainage from Pleurx catheter is improving and I am hopeful this will be able to be discontinued soon. She sees Dr. Luan Pulling the next few weeks. She still remains at risk for recurrence and may ultimately need a pleurodesis if diuretic regimen cannot keep things at [place].  3. Chronic hypoxic respiratory failure, she continues on oxygen. Keep follow-up with Dr. Luan Pulling.  4. Multivessel CAD with ischemic cardiomyopathy, LVEF 50-55% based on most recent assessment. Continue current medical regimen. No active angina symptoms.  5. PAD, symptomatically stable and followed by Dr. Scot Dock.  6. Bradycardia, remains an issue. We will reduce Coreg 3.125 mg twice daily.  Current medicines were reviewed with the patient today.  Disposition: FU with me in 2 months.   Signed, Satira Sark, MD, [facility] 03/19/2015 1:21 PM    [facility] at [place], [place], [place] [address] Phone: [phone]; Fax: [phone]

## 2015-03-19 NOTE — Patient Instructions (Signed)
Your physician has recommended you make the following change in your medication:  Decrease your carvedilol to 3.125 mg twice daily. You may take your 6.25 mg tablets by breaking them in half twice daily until they are finished. Continue all other medications the same. Your physician recommends that you schedule a follow-up appointment in: 2 months.

## 2015-03-23 DIAGNOSIS — S338XXA Sprain of other parts of lumbar spine and pelvis, initial encounter: Secondary | ICD-10-CM | POA: Diagnosis not present

## 2015-03-23 DIAGNOSIS — M47812 Spondylosis without myelopathy or radiculopathy, cervical region: Secondary | ICD-10-CM | POA: Diagnosis not present

## 2015-03-23 DIAGNOSIS — M4004 Postural kyphosis, thoracic region: Secondary | ICD-10-CM | POA: Diagnosis not present

## 2015-03-23 DIAGNOSIS — S43401A Unspecified sprain of right shoulder joint, initial encounter: Secondary | ICD-10-CM | POA: Diagnosis not present

## 2015-03-23 DIAGNOSIS — M47816 Spondylosis without myelopathy or radiculopathy, lumbar region: Secondary | ICD-10-CM | POA: Diagnosis not present

## 2015-03-23 DIAGNOSIS — S134XXA Sprain of ligaments of cervical spine, initial encounter: Secondary | ICD-10-CM | POA: Diagnosis not present

## 2015-03-23 DIAGNOSIS — M9903 Segmental and somatic dysfunction of lumbar region: Secondary | ICD-10-CM | POA: Diagnosis not present

## 2015-03-23 DIAGNOSIS — M545 Low back pain: Secondary | ICD-10-CM | POA: Diagnosis not present

## 2015-03-23 DIAGNOSIS — M9902 Segmental and somatic dysfunction of thoracic region: Secondary | ICD-10-CM | POA: Diagnosis not present

## 2015-03-23 DIAGNOSIS — M546 Pain in thoracic spine: Secondary | ICD-10-CM | POA: Diagnosis not present

## 2015-03-23 DIAGNOSIS — M9901 Segmental and somatic dysfunction of cervical region: Secondary | ICD-10-CM | POA: Diagnosis not present

## 2015-03-25 DIAGNOSIS — M9903 Segmental and somatic dysfunction of lumbar region: Secondary | ICD-10-CM | POA: Diagnosis not present

## 2015-03-25 DIAGNOSIS — M9901 Segmental and somatic dysfunction of cervical region: Secondary | ICD-10-CM | POA: Diagnosis not present

## 2015-03-25 DIAGNOSIS — S43401A Unspecified sprain of right shoulder joint, initial encounter: Secondary | ICD-10-CM | POA: Diagnosis not present

## 2015-03-25 DIAGNOSIS — M47812 Spondylosis without myelopathy or radiculopathy, cervical region: Secondary | ICD-10-CM | POA: Diagnosis not present

## 2015-03-25 DIAGNOSIS — S338XXA Sprain of other parts of lumbar spine and pelvis, initial encounter: Secondary | ICD-10-CM | POA: Diagnosis not present

## 2015-03-25 DIAGNOSIS — M546 Pain in thoracic spine: Secondary | ICD-10-CM | POA: Diagnosis not present

## 2015-03-25 DIAGNOSIS — S134XXA Sprain of ligaments of cervical spine, initial encounter: Secondary | ICD-10-CM | POA: Diagnosis not present

## 2015-03-25 DIAGNOSIS — M4004 Postural kyphosis, thoracic region: Secondary | ICD-10-CM | POA: Diagnosis not present

## 2015-03-25 DIAGNOSIS — M545 Low back pain: Secondary | ICD-10-CM | POA: Diagnosis not present

## 2015-03-25 DIAGNOSIS — M9902 Segmental and somatic dysfunction of thoracic region: Secondary | ICD-10-CM | POA: Diagnosis not present

## 2015-03-25 DIAGNOSIS — M47816 Spondylosis without myelopathy or radiculopathy, lumbar region: Secondary | ICD-10-CM | POA: Diagnosis not present

## 2015-03-29 DIAGNOSIS — S134XXA Sprain of ligaments of cervical spine, initial encounter: Secondary | ICD-10-CM | POA: Diagnosis not present

## 2015-03-29 DIAGNOSIS — M4004 Postural kyphosis, thoracic region: Secondary | ICD-10-CM | POA: Diagnosis not present

## 2015-03-29 DIAGNOSIS — M9903 Segmental and somatic dysfunction of lumbar region: Secondary | ICD-10-CM | POA: Diagnosis not present

## 2015-03-29 DIAGNOSIS — M47816 Spondylosis without myelopathy or radiculopathy, lumbar region: Secondary | ICD-10-CM | POA: Diagnosis not present

## 2015-03-29 DIAGNOSIS — S43401A Unspecified sprain of right shoulder joint, initial encounter: Secondary | ICD-10-CM | POA: Diagnosis not present

## 2015-03-29 DIAGNOSIS — M9901 Segmental and somatic dysfunction of cervical region: Secondary | ICD-10-CM | POA: Diagnosis not present

## 2015-03-29 DIAGNOSIS — M546 Pain in thoracic spine: Secondary | ICD-10-CM | POA: Diagnosis not present

## 2015-03-29 DIAGNOSIS — S338XXA Sprain of other parts of lumbar spine and pelvis, initial encounter: Secondary | ICD-10-CM | POA: Diagnosis not present

## 2015-03-29 DIAGNOSIS — M545 Low back pain: Secondary | ICD-10-CM | POA: Diagnosis not present

## 2015-03-29 DIAGNOSIS — M47812 Spondylosis without myelopathy or radiculopathy, cervical region: Secondary | ICD-10-CM | POA: Diagnosis not present

## 2015-03-29 DIAGNOSIS — M9902 Segmental and somatic dysfunction of thoracic region: Secondary | ICD-10-CM | POA: Diagnosis not present

## 2015-04-05 DIAGNOSIS — M9903 Segmental and somatic dysfunction of lumbar region: Secondary | ICD-10-CM | POA: Diagnosis not present

## 2015-04-05 DIAGNOSIS — M545 Low back pain: Secondary | ICD-10-CM | POA: Diagnosis not present

## 2015-04-05 DIAGNOSIS — M4004 Postural kyphosis, thoracic region: Secondary | ICD-10-CM | POA: Diagnosis not present

## 2015-04-05 DIAGNOSIS — S338XXA Sprain of other parts of lumbar spine and pelvis, initial encounter: Secondary | ICD-10-CM | POA: Diagnosis not present

## 2015-04-05 DIAGNOSIS — M546 Pain in thoracic spine: Secondary | ICD-10-CM | POA: Diagnosis not present

## 2015-04-05 DIAGNOSIS — M9902 Segmental and somatic dysfunction of thoracic region: Secondary | ICD-10-CM | POA: Diagnosis not present

## 2015-04-05 DIAGNOSIS — M47816 Spondylosis without myelopathy or radiculopathy, lumbar region: Secondary | ICD-10-CM | POA: Diagnosis not present

## 2015-04-05 DIAGNOSIS — S134XXA Sprain of ligaments of cervical spine, initial encounter: Secondary | ICD-10-CM | POA: Diagnosis not present

## 2015-04-05 DIAGNOSIS — M47812 Spondylosis without myelopathy or radiculopathy, cervical region: Secondary | ICD-10-CM | POA: Diagnosis not present

## 2015-04-05 DIAGNOSIS — M9901 Segmental and somatic dysfunction of cervical region: Secondary | ICD-10-CM | POA: Diagnosis not present

## 2015-04-05 DIAGNOSIS — S43401A Unspecified sprain of right shoulder joint, initial encounter: Secondary | ICD-10-CM | POA: Diagnosis not present

## 2015-04-12 ENCOUNTER — Other Ambulatory Visit: Payer: Self-pay | Admitting: Cardiology

## 2015-04-12 DIAGNOSIS — M9903 Segmental and somatic dysfunction of lumbar region: Secondary | ICD-10-CM | POA: Diagnosis not present

## 2015-04-12 DIAGNOSIS — S134XXA Sprain of ligaments of cervical spine, initial encounter: Secondary | ICD-10-CM | POA: Diagnosis not present

## 2015-04-12 DIAGNOSIS — S43401A Unspecified sprain of right shoulder joint, initial encounter: Secondary | ICD-10-CM | POA: Diagnosis not present

## 2015-04-12 DIAGNOSIS — M546 Pain in thoracic spine: Secondary | ICD-10-CM | POA: Diagnosis not present

## 2015-04-12 DIAGNOSIS — M545 Low back pain: Secondary | ICD-10-CM | POA: Diagnosis not present

## 2015-04-12 DIAGNOSIS — M47812 Spondylosis without myelopathy or radiculopathy, cervical region: Secondary | ICD-10-CM | POA: Diagnosis not present

## 2015-04-12 DIAGNOSIS — M47816 Spondylosis without myelopathy or radiculopathy, lumbar region: Secondary | ICD-10-CM | POA: Diagnosis not present

## 2015-04-12 DIAGNOSIS — M9901 Segmental and somatic dysfunction of cervical region: Secondary | ICD-10-CM | POA: Diagnosis not present

## 2015-04-12 DIAGNOSIS — S338XXA Sprain of other parts of lumbar spine and pelvis, initial encounter: Secondary | ICD-10-CM | POA: Diagnosis not present

## 2015-04-12 DIAGNOSIS — M4004 Postural kyphosis, thoracic region: Secondary | ICD-10-CM | POA: Diagnosis not present

## 2015-04-12 DIAGNOSIS — M9902 Segmental and somatic dysfunction of thoracic region: Secondary | ICD-10-CM | POA: Diagnosis not present

## 2015-04-13 ENCOUNTER — Other Ambulatory Visit: Payer: Self-pay | Admitting: *Deleted

## 2015-04-13 MED ORDER — TORSEMIDE 20 MG PO TABS: 40.0000 mg | ORAL_TABLET | ORAL | Status: DC

## 2015-04-15 DIAGNOSIS — J9611 Chronic respiratory failure with hypoxia: Secondary | ICD-10-CM | POA: Diagnosis not present

## 2015-04-15 DIAGNOSIS — I2510 Atherosclerotic heart disease of native coronary artery without angina pectoris: Secondary | ICD-10-CM | POA: Diagnosis not present

## 2015-04-15 DIAGNOSIS — J449 Chronic obstructive pulmonary disease, unspecified: Secondary | ICD-10-CM | POA: Diagnosis not present

## 2015-04-15 DIAGNOSIS — J90 Pleural effusion, not elsewhere classified: Secondary | ICD-10-CM | POA: Diagnosis not present

## 2015-04-16 DIAGNOSIS — E1122 Type 2 diabetes mellitus with diabetic chronic kidney disease: Secondary | ICD-10-CM | POA: Diagnosis not present

## 2015-04-16 DIAGNOSIS — I272 Other secondary pulmonary hypertension: Secondary | ICD-10-CM | POA: Diagnosis not present

## 2015-04-16 DIAGNOSIS — I509 Heart failure, unspecified: Secondary | ICD-10-CM | POA: Diagnosis not present

## 2015-04-16 DIAGNOSIS — M79605 Pain in left leg: Secondary | ICD-10-CM | POA: Diagnosis not present

## 2015-04-19 DIAGNOSIS — M4004 Postural kyphosis, thoracic region: Secondary | ICD-10-CM | POA: Diagnosis not present

## 2015-04-19 DIAGNOSIS — S134XXA Sprain of ligaments of cervical spine, initial encounter: Secondary | ICD-10-CM | POA: Diagnosis not present

## 2015-04-19 DIAGNOSIS — M47812 Spondylosis without myelopathy or radiculopathy, cervical region: Secondary | ICD-10-CM | POA: Diagnosis not present

## 2015-04-19 DIAGNOSIS — M47816 Spondylosis without myelopathy or radiculopathy, lumbar region: Secondary | ICD-10-CM | POA: Diagnosis not present

## 2015-04-19 DIAGNOSIS — M546 Pain in thoracic spine: Secondary | ICD-10-CM | POA: Diagnosis not present

## 2015-04-19 DIAGNOSIS — S338XXA Sprain of other parts of lumbar spine and pelvis, initial encounter: Secondary | ICD-10-CM | POA: Diagnosis not present

## 2015-04-19 DIAGNOSIS — M545 Low back pain: Secondary | ICD-10-CM | POA: Diagnosis not present

## 2015-04-19 DIAGNOSIS — M9903 Segmental and somatic dysfunction of lumbar region: Secondary | ICD-10-CM | POA: Diagnosis not present

## 2015-04-19 DIAGNOSIS — S43401A Unspecified sprain of right shoulder joint, initial encounter: Secondary | ICD-10-CM | POA: Diagnosis not present

## 2015-04-19 DIAGNOSIS — M9902 Segmental and somatic dysfunction of thoracic region: Secondary | ICD-10-CM | POA: Diagnosis not present

## 2015-04-19 DIAGNOSIS — M9901 Segmental and somatic dysfunction of cervical region: Secondary | ICD-10-CM | POA: Diagnosis not present

## 2015-05-03 DIAGNOSIS — M47812 Spondylosis without myelopathy or radiculopathy, cervical region: Secondary | ICD-10-CM | POA: Diagnosis not present

## 2015-05-03 DIAGNOSIS — M9903 Segmental and somatic dysfunction of lumbar region: Secondary | ICD-10-CM | POA: Diagnosis not present

## 2015-05-03 DIAGNOSIS — S338XXA Sprain of other parts of lumbar spine and pelvis, initial encounter: Secondary | ICD-10-CM | POA: Diagnosis not present

## 2015-05-03 DIAGNOSIS — M47816 Spondylosis without myelopathy or radiculopathy, lumbar region: Secondary | ICD-10-CM | POA: Diagnosis not present

## 2015-05-03 DIAGNOSIS — M545 Low back pain: Secondary | ICD-10-CM | POA: Diagnosis not present

## 2015-05-03 DIAGNOSIS — M546 Pain in thoracic spine: Secondary | ICD-10-CM | POA: Diagnosis not present

## 2015-05-03 DIAGNOSIS — S134XXA Sprain of ligaments of cervical spine, initial encounter: Secondary | ICD-10-CM | POA: Diagnosis not present

## 2015-05-03 DIAGNOSIS — M9902 Segmental and somatic dysfunction of thoracic region: Secondary | ICD-10-CM | POA: Diagnosis not present

## 2015-05-03 DIAGNOSIS — M4004 Postural kyphosis, thoracic region: Secondary | ICD-10-CM | POA: Diagnosis not present

## 2015-05-03 DIAGNOSIS — M9901 Segmental and somatic dysfunction of cervical region: Secondary | ICD-10-CM | POA: Diagnosis not present

## 2015-05-03 DIAGNOSIS — S43401A Unspecified sprain of right shoulder joint, initial encounter: Secondary | ICD-10-CM | POA: Diagnosis not present

## 2015-05-04 DIAGNOSIS — J918 Pleural effusion in other conditions classified elsewhere: Secondary | ICD-10-CM | POA: Diagnosis not present

## 2015-05-17 DIAGNOSIS — M9901 Segmental and somatic dysfunction of cervical region: Secondary | ICD-10-CM | POA: Diagnosis not present

## 2015-05-17 DIAGNOSIS — M546 Pain in thoracic spine: Secondary | ICD-10-CM | POA: Diagnosis not present

## 2015-05-17 DIAGNOSIS — S338XXA Sprain of other parts of lumbar spine and pelvis, initial encounter: Secondary | ICD-10-CM | POA: Diagnosis not present

## 2015-05-17 DIAGNOSIS — M9903 Segmental and somatic dysfunction of lumbar region: Secondary | ICD-10-CM | POA: Diagnosis not present

## 2015-05-17 DIAGNOSIS — M4004 Postural kyphosis, thoracic region: Secondary | ICD-10-CM | POA: Diagnosis not present

## 2015-05-17 DIAGNOSIS — M9902 Segmental and somatic dysfunction of thoracic region: Secondary | ICD-10-CM | POA: Diagnosis not present

## 2015-05-17 DIAGNOSIS — S43401A Unspecified sprain of right shoulder joint, initial encounter: Secondary | ICD-10-CM | POA: Diagnosis not present

## 2015-05-17 DIAGNOSIS — M47812 Spondylosis without myelopathy or radiculopathy, cervical region: Secondary | ICD-10-CM | POA: Diagnosis not present

## 2015-05-17 DIAGNOSIS — M545 Low back pain: Secondary | ICD-10-CM | POA: Diagnosis not present

## 2015-05-17 DIAGNOSIS — S134XXA Sprain of ligaments of cervical spine, initial encounter: Secondary | ICD-10-CM | POA: Diagnosis not present

## 2015-05-17 DIAGNOSIS — M47816 Spondylosis without myelopathy or radiculopathy, lumbar region: Secondary | ICD-10-CM | POA: Diagnosis not present

## 2015-05-20 DIAGNOSIS — F329 Major depressive disorder, single episode, unspecified: Secondary | ICD-10-CM | POA: Diagnosis not present

## 2015-05-20 DIAGNOSIS — R5383 Other fatigue: Secondary | ICD-10-CM | POA: Diagnosis not present

## 2015-05-20 DIAGNOSIS — E7800 Pure hypercholesterolemia, unspecified: Secondary | ICD-10-CM | POA: Diagnosis not present

## 2015-05-20 DIAGNOSIS — Z299 Encounter for prophylactic measures, unspecified: Secondary | ICD-10-CM | POA: Diagnosis not present

## 2015-05-20 DIAGNOSIS — Z7189 Other specified counseling: Secondary | ICD-10-CM | POA: Diagnosis not present

## 2015-05-20 DIAGNOSIS — Z1389 Encounter for screening for other disorder: Secondary | ICD-10-CM | POA: Diagnosis not present

## 2015-05-20 DIAGNOSIS — Z0000 Encounter for general adult medical examination without abnormal findings: Secondary | ICD-10-CM | POA: Diagnosis not present

## 2015-05-20 DIAGNOSIS — Z1211 Encounter for screening for malignant neoplasm of colon: Secondary | ICD-10-CM | POA: Diagnosis not present

## 2015-05-20 DIAGNOSIS — M5440 Lumbago with sciatica, unspecified side: Secondary | ICD-10-CM | POA: Diagnosis not present

## 2015-05-21 DIAGNOSIS — E7800 Pure hypercholesterolemia, unspecified: Secondary | ICD-10-CM | POA: Diagnosis not present

## 2015-05-21 DIAGNOSIS — Z79899 Other long term (current) drug therapy: Secondary | ICD-10-CM | POA: Diagnosis not present

## 2015-05-21 DIAGNOSIS — R5383 Other fatigue: Secondary | ICD-10-CM | POA: Diagnosis not present

## 2015-05-28 ENCOUNTER — Encounter: Payer: Self-pay | Admitting: *Deleted

## 2015-05-28 ENCOUNTER — Encounter: Payer: Self-pay | Admitting: Cardiology

## 2015-05-28 ENCOUNTER — Ambulatory Visit (INDEPENDENT_AMBULATORY_CARE_PROVIDER_SITE_OTHER): Payer: Medicare Other | Admitting: Cardiology

## 2015-05-28 VITALS — BP 124/67 | HR 51 | Ht 66.0 in | Wt 207.4 lb

## 2015-05-28 DIAGNOSIS — I10 Essential (primary) hypertension: Secondary | ICD-10-CM

## 2015-05-28 DIAGNOSIS — N289 Disorder of kidney and ureter, unspecified: Secondary | ICD-10-CM

## 2015-05-28 DIAGNOSIS — I2510 Atherosclerotic heart disease of native coronary artery without angina pectoris: Secondary | ICD-10-CM

## 2015-05-28 DIAGNOSIS — N189 Chronic kidney disease, unspecified: Secondary | ICD-10-CM | POA: Diagnosis not present

## 2015-05-28 MED ORDER — METOLAZONE 2.5 MG PO TABS: 2.5000 mg | ORAL_TABLET | ORAL | Status: DC

## 2015-05-28 MED ORDER — TORSEMIDE 20 MG PO TABS: 60.0000 mg | ORAL_TABLET | Freq: Two times a day (BID) | ORAL | Status: DC

## 2015-05-28 NOTE — Progress Notes (Signed)
Cardiology Office Note  Date: 05/28/2015   ID: [name] [name], DOB 1945-02-27, MRN [id number]  PCP: Glenda Chroman, MD  Primary Cardiologist: Rozann Lesches, MD   Chief Complaint  Patient presents with  . Diastolic heart failure  . Coronary Artery Disease    History of Present Illness: [name] [name] is a medically complex 70 y.o. female last seen in March. She is here for a follow-up visit. Unfortunately, she has gained weight since I last saw her, nearly 10 pounds. We discussed her diet and sodium restriction. She reports compliance with her medications and indicates that she increased her Demadex earlier this week, although had already gained more weight. Drainage from her Pleurx catheter has also increased from its low last time which would be consistent with fluid overload. She reports a feeling of abdominal bloating.  Otherwise, no angina symptoms or palpitations. I reviewed her remaining cardiac medications which are stable and outlined below.  She has been taken off of potassium supplements by Dr. Woody Seller, recent lab work obtained. We are requesting the results. As of February, creatinine was 1.1 and potassium was normal range.  Past Medical History  Diagnosis Date  . Mixed hyperlipidemia   . Essential hypertension, benign   . Depression   . Cellulitis     a. Recurrent, bilateral legs  . Carotid artery disease (HCC)     a. Bilateral CEA; 50-60% bilateral ICA stenosis, 11/12  . PAD (peripheral artery disease) ([place])   . Suicide attempt ([place]) 08/2002  . TIA (transient ischemic attack)   . TMJ syndrome   . Herniated disc   . Ischemic cardiomyopathy     a. 11/2010 TEE: EF 40-45%.  . Critical lower limb ischemia   . Venous insufficiency   . Coronary atherosclerosis of native coronary artery     a. 11/2010 NSTEMI/CABG x 4: L-LAD; S-PL; S-OM; S-DX, by PVT.  [name] NSTEMI (non-ST elevated myocardial infarction) ([place]) 11/2010  . Pneumonia 2000's X 2  . Type 2 diabetes mellitus ([place])    . History of blood transfusion   . Migraine   . Stroke ([place]) 08/2009  . Arthritis   . Chronic back pain   . On home oxygen therapy   . Chronic diastolic heart failure [facility])     Past Surgical History  Procedure Laterality Date  . Appendectomy    . Cholecystectomy    . Abdominal hysterectomy    . Carotid endarterectomy Bilateral 2011    Bilateral - Dr. Kellie Simmering  . Wrist fracture surgery Left     "grafted bone from hip to wrist"  . Coronary artery bypass graft  11/24/2010    Procedure: CORONARY ARTERY BYPASS GRAFTING (CABG);  Surgeon: Tharon Aquas Adelene Idler, MD;  Location: [place];  Service: Open Heart Surgery;  Laterality: N/A;  Coronary Artery Bypass Graft times four on pump utilizing left internal mammary artery and bilateral greater saphenous veins harvested endoscopically, transesophageal echocardiogram   . Toe surgery Left     "put pin in 2nd toe"  . Lower extremity angiogram  09/08/2012; 10/06/2013    "found 100% blockage; unsuccessful attempt at crossing a chronic total occlusion of the left SFA in the setting of critical limb ischemia  . Dilation and curettage of uterus    . Tubal ligation    . Debridement toe Left     "nonhealing wound; 3rd digit  . Cardiac catheterization  11/2010  . Coronary angioplasty with stent placement  03/2013    "1"  .  Colonoscopy w/ biopsies and polypectomy    . Fracture surgery    . Femoral-popliteal bypass graft Left 10/07/2013    Procedure: LEFT FEMORAL-POPLITEAL ARTERY BYPASS GRAFT;  Surgeon: Angelia Mould, MD;  Location: [place];  Service: Vascular;  Laterality: Left;  . Intraoperative arteriogram Left 10/07/2013    Procedure: INTRA OPERATIVE ARTERIOGRAM LEFT LEG;  Surgeon: Angelia Mould, MD;  Location: [place];  Service: Vascular;  Laterality: Left;  . Endarterectomy popliteal Left 10/07/2013    Procedure: LEFT POPLITEAL ENDARTERECTOMY ;  Surgeon: Angelia Mould, MD;  Location: [place];  Service: Vascular;  Laterality: Left;  . Left  and right heart catheterization with coronary/graft angiogram N/A 03/27/2013    Procedure: LEFT AND RIGHT HEART CATHETERIZATION WITH Beatrix Fetters;  Surgeon: Burnell Blanks, MD;  Location: [facility] CATH LAB;  Service: Cardiovascular;  Laterality: N/A;  . Lower extremity angiogram Bilateral 09/08/2013    Procedure: LOWER EXTREMITY ANGIOGRAM;  Surgeon: [name], MD;  Location: [facility] CATH LAB;  Service: Cardiovascular;  Laterality: Bilateral;    Current Outpatient Prescriptions  Medication Sig Dispense Refill  . acetaminophen (TYLENOL) 500 MG tablet Take 1,000 mg by mouth every 6 (six) hours as needed.    [name] albuterol (PROVENTIL HFA;VENTOLIN HFA) 108 (90 BASE) MCG/ACT inhaler Inhale 2 puffs into the lungs daily as needed for wheezing or shortness of breath.     [name] amLODipine (NORVASC) 10 MG tablet Take 1 tablet (10 mg total) by mouth daily. 90 tablet 3  . aspirin 81 MG EC tablet Take 81 mg by mouth daily.    [name] atorvastatin (LIPITOR) 40 MG tablet Take 40 mg by mouth daily at 6 PM.    . carvedilol (COREG) 3.125 MG tablet Take 1 tablet (3.125 mg total) by mouth 2 (two) times daily with a meal. 60 tablet 3  . citalopram (CELEXA) 40 MG tablet Take 1 tablet by mouth daily.    . clopidogrel (PLAVIX) 75 MG tablet TAKE ONE TABLET BY MOUTH ONCE DAILY WITH BREAKFAST 30 tablet 6  . Docusate Calcium (STOOL SOFTENER PO) Take 100 mg by mouth daily as needed (for constipation).     . gabapentin (NEURONTIN) 300 MG capsule Take 300 mg by mouth 2 (two) times daily.     [name] glimepiride (AMARYL) 2 MG tablet Take 2 mg by mouth 2 (two) times daily.     . hydrALAZINE (APRESOLINE) 25 MG tablet Take 25 mg by mouth 3 (three) times daily.    [name] HYDROcodone-acetaminophen (NORCO/VICODIN) 5-325 MG tablet Take 1 tablet by mouth every 6 (six) hours as needed for moderate pain. 30 tablet 0  . insulin aspart (NOVOLOG) 100 UNIT/ML injection Inject 3 Units into the skin 3 (three) times daily with meals. 10 mL 11  . insulin  detemir (LEVEMIR) 100 UNIT/ML injection Inject 0.07 mLs (7 Units total) into the skin at bedtime. 10 mL 11  . isosorbide mononitrate (IMDUR) 30 MG 24 hr tablet TAKE ONE TABLET BY MOUTH ONCE DAILY 30 tablet 3  . lisinopril (PRINIVIL,ZESTRIL) 10 MG tablet Take 1 tablet (10 mg total) by mouth daily. 90 tablet 3  . metolazone (ZAROXOLYN) 2.5 MG tablet Take 1 tablet (2.5 mg total) by mouth as directed. For the next few days; If your weight comes down 5-6 lbs over weekend, stop metolazone and go back to torsemide 40 mg twice daily 30 tablet 0  . Multiple Vitamin (MULTIVITAMIN WITH MINERALS) TABS tablet Take 1 tablet by mouth daily.    . nitroGLYCERIN (  NITROSTAT) 0.4 MG SL tablet Place 1 tablet (0.4 mg total) under the tongue every 5 (five) minutes as needed for chest pain. Up to 3 doses. If no relief after 3rd dose, proceed to the ED for an evaluation 25 tablet 3  . torsemide (DEMADEX) 20 MG tablet Take 2-3 tablets (40-60 mg total) by mouth as directed. 120 tablet 3  . Umeclidinium-Vilanterol 62.5-25 MCG/INH AEPB Inhale 1 application into the lungs daily.      No current facility-administered medications for this visit.   Allergies:  Other; Strawberry extract; Sulfa antibiotics; and Tape   Social History: The patient  reports that she quit smoking about 5 years ago. Her smoking use included Cigarettes. She started smoking about 59 years ago. She has a 150 pack-year smoking history. She has never used smokeless tobacco. She reports that she drinks alcohol. She reports that she uses illicit drugs (Marijuana).   ROS:  Please see the history of present illness. Otherwise, complete review of systems is positive for fatigue, abdominal bloating.  All other systems are reviewed and negative.   Physical Exam: VS:  BP 124/67 mmHg  Pulse 51  Ht _0  (1.676 m)  Wt 207 lb 6.4 oz (94.076 kg)  BMI 33.49 kg/m2  SpO2 95%, BMI Body mass index is 33.49 kg/(m^2).  Wt Readings from Last 3 Encounters:  05/28/15 207  lb 6.4 oz (94.076 kg)  03/19/15 196 lb 3.2 oz (88.996 kg)  02/17/15 197 lb (89.359 kg)    Chronically ill-appearing woman, no distress. Wearing oxygen. HEENT: Conjunctiva and lids normal, oropharynx clear. Neck: Supple, no elevated JVP, bilateral CEA scars, no thyromegaly. Lungs: Clear to auscultation with decreased breath sounds at the right lower zone, Pleurx catheter in place, nonlabored breathing at rest. Cardiac: Regular rate and rhythm, no S3, soft systolic murmur, no pericardial rub. Abdomen: Protuberant, bowel sounds present, no guarding or rebound. Extremities: 2+ edema and stasis, distal pulses 1+.  ECG: I personally reviewed the prior tracing from 10/12/2014 which showed sinus bradycardia with sinus arrhythmia and IVCD.  Recent Labwork: 10/10/2014: B Natriuretic Peptide 914.0* 10/12/2014: ALT 23; AST 24; Magnesium 2.1; TSH 2.210 10/16/2014: Hemoglobin 9.0*; Platelets 269 10/18/2014: BUN 32*; Creatinine, Ser 1.02*; Potassium 4.7; Sodium 133*  February 2017: BUN 36, creatinine 1.1, potassium 3.9  Other Studies Reviewed Today:  Echocardiogram 09/09/2014: Study Conclusions  - Left ventricle: The cavity size was normal. Wall thickness was normal. Systolic function was normal. The estimated ejection fraction was in the range of 50% to 55%. Wall motion was normal; there were no regional wall motion abnormalities. - Left atrium: The atrium was mildly dilated.  Assessment and Plan:  1. Acute on chronic diastolic heart failure. Increase Demadex to 60 mg twice daily and use Zaroxolyn 2.5 mg daily for the next week. Requesting recent lab work from Dr. Woody Seller. Does not look like she is going to be able to have her Pleurx catheter taken out anytime soon. If her weight does not start to trend down, made a hospitalization for IV diuretics.  2. Multivessel CAD status post CABG, no active angina symptoms. Continue current medical regimen.  3. Essential hypertension, blood pressure is  well controlled today.  4. History of renal insufficiency, follow-up BMET results from Dr. Woody Seller.  Current medicines were reviewed with the patient today.  Disposition: FU with me in 1 month.   Signed, Satira Sark, MD, [facility] 05/28/2015 2:41 PM    [facility] at [place],  [place], [place] [address] Phone: [phone]; Fax: [phone]

## 2015-05-28 NOTE — Patient Instructions (Addendum)
Your physician has recommended you make the following change in your medication:  Please take imdur 30 mg daily. Increase torsemide to 60 mg twice daily. Take zaroxolyn 2.5 mg daily for one week to get weight back down; then, resume previous directions.  Continue all other medications the same. Your physician recommends that you schedule a follow-up appointment in: 1 month.

## 2015-05-31 DIAGNOSIS — M47812 Spondylosis without myelopathy or radiculopathy, cervical region: Secondary | ICD-10-CM | POA: Diagnosis not present

## 2015-05-31 DIAGNOSIS — M4004 Postural kyphosis, thoracic region: Secondary | ICD-10-CM | POA: Diagnosis not present

## 2015-05-31 DIAGNOSIS — S134XXA Sprain of ligaments of cervical spine, initial encounter: Secondary | ICD-10-CM | POA: Diagnosis not present

## 2015-05-31 DIAGNOSIS — M546 Pain in thoracic spine: Secondary | ICD-10-CM | POA: Diagnosis not present

## 2015-05-31 DIAGNOSIS — M47816 Spondylosis without myelopathy or radiculopathy, lumbar region: Secondary | ICD-10-CM | POA: Diagnosis not present

## 2015-05-31 DIAGNOSIS — M545 Low back pain: Secondary | ICD-10-CM | POA: Diagnosis not present

## 2015-05-31 DIAGNOSIS — S338XXA Sprain of other parts of lumbar spine and pelvis, initial encounter: Secondary | ICD-10-CM | POA: Diagnosis not present

## 2015-05-31 DIAGNOSIS — M9903 Segmental and somatic dysfunction of lumbar region: Secondary | ICD-10-CM | POA: Diagnosis not present

## 2015-05-31 DIAGNOSIS — S43401A Unspecified sprain of right shoulder joint, initial encounter: Secondary | ICD-10-CM | POA: Diagnosis not present

## 2015-05-31 DIAGNOSIS — M9902 Segmental and somatic dysfunction of thoracic region: Secondary | ICD-10-CM | POA: Diagnosis not present

## 2015-05-31 DIAGNOSIS — M9901 Segmental and somatic dysfunction of cervical region: Secondary | ICD-10-CM | POA: Diagnosis not present

## 2015-06-09 DIAGNOSIS — I509 Heart failure, unspecified: Secondary | ICD-10-CM | POA: Diagnosis not present

## 2015-06-09 DIAGNOSIS — E119 Type 2 diabetes mellitus without complications: Secondary | ICD-10-CM | POA: Diagnosis not present

## 2015-06-09 DIAGNOSIS — I2510 Atherosclerotic heart disease of native coronary artery without angina pectoris: Secondary | ICD-10-CM | POA: Diagnosis not present

## 2015-06-10 DIAGNOSIS — J918 Pleural effusion in other conditions classified elsewhere: Secondary | ICD-10-CM | POA: Diagnosis not present

## 2015-06-15 DIAGNOSIS — J449 Chronic obstructive pulmonary disease, unspecified: Secondary | ICD-10-CM | POA: Diagnosis not present

## 2015-06-15 DIAGNOSIS — J90 Pleural effusion, not elsewhere classified: Secondary | ICD-10-CM | POA: Diagnosis not present

## 2015-06-15 DIAGNOSIS — I5032 Chronic diastolic (congestive) heart failure: Secondary | ICD-10-CM | POA: Diagnosis not present

## 2015-06-15 DIAGNOSIS — J9611 Chronic respiratory failure with hypoxia: Secondary | ICD-10-CM | POA: Diagnosis not present

## 2015-06-25 DIAGNOSIS — Z79899 Other long term (current) drug therapy: Secondary | ICD-10-CM | POA: Diagnosis not present

## 2015-06-29 DIAGNOSIS — Z299 Encounter for prophylactic measures, unspecified: Secondary | ICD-10-CM | POA: Diagnosis not present

## 2015-06-29 DIAGNOSIS — I272 Other secondary pulmonary hypertension: Secondary | ICD-10-CM | POA: Diagnosis not present

## 2015-06-29 DIAGNOSIS — I509 Heart failure, unspecified: Secondary | ICD-10-CM | POA: Diagnosis not present

## 2015-06-29 DIAGNOSIS — M5440 Lumbago with sciatica, unspecified side: Secondary | ICD-10-CM | POA: Diagnosis not present

## 2015-07-01 ENCOUNTER — Ambulatory Visit: Payer: Medicare Other | Admitting: Cardiology

## 2015-07-12 ENCOUNTER — Ambulatory Visit (INDEPENDENT_AMBULATORY_CARE_PROVIDER_SITE_OTHER): Payer: Medicare Other | Admitting: Cardiology

## 2015-07-12 ENCOUNTER — Encounter: Payer: Self-pay | Admitting: Cardiology

## 2015-07-12 VITALS — BP 138/55 | HR 52 | Ht 66.0 in | Wt 212.2 lb

## 2015-07-12 DIAGNOSIS — I2510 Atherosclerotic heart disease of native coronary artery without angina pectoris: Secondary | ICD-10-CM

## 2015-07-12 DIAGNOSIS — N289 Disorder of kidney and ureter, unspecified: Secondary | ICD-10-CM | POA: Diagnosis not present

## 2015-07-12 DIAGNOSIS — N189 Chronic kidney disease, unspecified: Secondary | ICD-10-CM

## 2015-07-12 DIAGNOSIS — I5033 Acute on chronic diastolic (congestive) heart failure: Secondary | ICD-10-CM | POA: Diagnosis not present

## 2015-07-12 MED ORDER — TORSEMIDE 20 MG PO TABS: 40.0000 mg | ORAL_TABLET | Freq: Two times a day (BID) | ORAL | Status: DC

## 2015-07-12 NOTE — Progress Notes (Signed)
Cardiology Office Note  Date: 07/12/2015   ID: [name] [name], DOB [birth year], MRN [id number]  PCP: [name] Chroman, MD  Primary Cardiologist: [name] Lesches, MD   Chief Complaint  Patient presents with  . Diastolic heart failure  . Coronary Artery Disease    History of Present Illness: [name] [name] is a 70 y.o. female last seen in May. She presents with her daughter. At the last visit we increased Demadex to 60 mg twice daily and had her use Zaroxolyn regularly for a week. This did result in some weight loss, although her Demadex was subsequently cut back by PCP based on renal insufficiency and her weight has climbed higher than at the last visit. She feels abdominal bloating, has chronic shortness of breath. Also about 500 cc out of her Pleurx catheter with regular drainage.  Today we discussed her last lab work, also plan to increase her Demadex to 40 mg twice daily from 10 mg twice daily which is what she has been taking in the last few weeks. She has shown before that she requires higher dose diuretics to achieve adequate diuresis and weight loss. Fortunately she has not been hospitalized, largely due to the presence of the Pleurx catheter.  Past Medical History  Diagnosis Date  . Mixed hyperlipidemia   . Essential hypertension, benign   . Depression   . Cellulitis     a. Recurrent, bilateral legs  . Carotid artery disease (HCC)     a. Bilateral CEA; 50-60% bilateral ICA stenosis, 11/12  . PAD (peripheral artery disease) ([place])   . Suicide attempt ([place]) 08/2002  . TIA (transient ischemic attack)   . TMJ syndrome   . Herniated disc   . Ischemic cardiomyopathy     a. 11/2010 TEE: EF 40-45%.  . Critical lower limb ischemia   . Venous insufficiency   . Coronary atherosclerosis of native coronary artery     a. 11/2010 NSTEMI/CABG x 4: L-LAD; S-PL; S-OM; S-DX, by PVT.  [name] NSTEMI (non-ST elevated myocardial infarction) ([place]) 11/2010  . Pneumonia 2000's X 2  . Type 2 diabetes  mellitus ([place])   . History of blood transfusion   . Migraine   . Stroke ([place]) 08/2009  . Arthritis   . Chronic back pain   . On home oxygen therapy   . Chronic diastolic heart failure [facility])     Past Surgical History  Procedure Laterality Date  . Appendectomy    . Cholecystectomy    . Abdominal hysterectomy    . Carotid endarterectomy Bilateral 2011    Bilateral - Dr. Kellie [name]  . Wrist fracture surgery Left     "grafted bone from hip to wrist"  . Coronary artery bypass graft  11/24/2010    Procedure: CORONARY ARTERY BYPASS GRAFTING (CABG);  Surgeon: [name] Aquas Adelene Idler, MD;  Location: [place];  Service: Open Heart Surgery;  Laterality: N/A;  Coronary Artery Bypass Graft times four on pump utilizing left internal mammary artery and bilateral greater saphenous veins harvested endoscopically, transesophageal echocardiogram   . Toe surgery Left     "put pin in 2nd toe"  . Lower extremity angiogram  09/08/2012; 10/06/2013    "found 100% blockage; unsuccessful attempt at crossing a chronic total occlusion of the left SFA in the setting of critical limb ischemia  . Dilation and curettage of uterus    . Tubal ligation    . Debridement toe Left     "nonhealing wound; 3rd digit  .  Cardiac catheterization  11/2010  . Coronary angioplasty with stent placement  03/2013    "1"  . Colonoscopy w/ biopsies and polypectomy    . Fracture surgery    . Femoral-popliteal bypass graft Left 10/07/2013    Procedure: LEFT FEMORAL-POPLITEAL ARTERY BYPASS GRAFT;  Surgeon: Angelia Mould, MD;  Location: [place];  Service: Vascular;  Laterality: Left;  . Intraoperative arteriogram Left 10/07/2013    Procedure: INTRA OPERATIVE ARTERIOGRAM LEFT LEG;  Surgeon: Angelia Mould, MD;  Location: [place];  Service: Vascular;  Laterality: Left;  . Endarterectomy popliteal Left 10/07/2013    Procedure: LEFT POPLITEAL ENDARTERECTOMY ;  Surgeon: Angelia Mould, MD;  Location: [place];  Service: Vascular;  Laterality:  Left;  . Left and right heart catheterization with coronary/graft angiogram N/A 03/27/2013    Procedure: LEFT AND RIGHT HEART CATHETERIZATION WITH Beatrix Fetters;  Surgeon: Burnell Blanks, MD;  Location: [facility] C[facility] LAB;  Service: Cardiovascular;  Laterality: N/A;  . Lower extremity angiogram Bilateral 09/08/2013    Procedure: LOWER EXTREMITY ANGIOGRAM;  Surgeon: [name], MD;  Location: [facility] CATH LAB;  Service: Cardiovascular;  Laterality: Bilateral;    Current Outpatient Prescriptions  Medication Sig Dispense Refill  . acetaminophen (TYLENOL) 500 MG tablet Take 1,000 mg by mouth every 6 (six) hours as needed.    [name] albuterol (PROVENTIL HFA;VENTOLIN HFA) 108 (90 BASE) MCG/ACT inhaler Inhale 2 puffs into the lungs daily as needed for wheezing or shortness of breath.     [name] amLODipine (NORVASC) 10 MG tablet Take 1 tablet (10 mg total) by mouth daily. 90 tablet 3  . aspirin 81 MG EC tablet Take 81 mg by mouth daily.    [name] atorvastatin (LIPITOR) 40 MG tablet Take 40 mg by mouth daily at 6 PM.    . carvedilol (COREG) 3.125 MG tablet Take 1 tablet (3.125 mg total) by mouth 2 (two) times daily with a meal. 60 tablet 3  . citalopram (CELEXA) 40 MG tablet Take 1 tablet by mouth daily.    . clopidogrel (PLAVIX) 75 MG tablet TAKE ONE TABLET BY MOUTH ONCE DAILY WITH BREAKFAST 30 tablet 6  . Docusate Calcium (STOOL SOFTENER PO) Take 100 mg by mouth daily as needed (for constipation).     . gabapentin (NEURONTIN) 300 MG capsule Take one tab (335m) by mouth every morning & 2 tabs (6080m every evening at bedtime    . glimepiride (AMARYL) 2 MG tablet Take 2 mg by mouth 2 (two) times daily.     . hydrALAZINE (APRESOLINE) 25 MG tablet Take 25 mg by mouth 3 (three) times daily.    . Marland KitchenYDROcodone-acetaminophen (NORCO/VICODIN) 5-325 MG tablet Take 1 tablet by mouth every 6 (six) hours as needed for moderate pain. 30 tablet 0  . insulin aspart (NOVOLOG) 100 UNIT/ML injection Inject 3 Units into the  skin 3 (three) times daily with meals. 10 mL 11  . insulin detemir (LEVEMIR) 100 UNIT/ML injection Inject 0.07 mLs (7 Units total) into the skin at bedtime. 10 mL 11  . isosorbide mononitrate (IMDUR) 30 MG 24 hr tablet Take 30 mg by mouth every evening.    . Marland Kitchenisinopril (PRINIVIL,ZESTRIL) 10 MG tablet Take 1 tablet (10 mg total) by mouth daily. 90 tablet 3  . Multiple Vitamin (MULTIVITAMIN WITH MINERALS) TABS tablet Take 1 tablet by mouth daily.    . nitroGLYCERIN (NITROSTAT) 0.4 MG SL tablet Place 1 tablet (0.4 mg total) under the tongue every 5 (five) minutes as needed for  chest pain. Up to 3 doses. If no relief after 3rd dose, proceed to the ED for an evaluation 25 tablet 3  . Probiotic Product (PROBIOTIC DAILY PO) Take 0.5 tablets by mouth 2 (two) times daily.    [name] Umeclidinium-Vilanterol 62.5-25 MCG/INH AEPB Inhale 1 application into the lungs daily. As needed    . metolazone (ZAROXOLYN) 2.5 MG tablet Take 1 tablet (2.5 mg total) by mouth as directed. For the next week until weight is back down, then resume previous directions as directed (Patient not taking: Reported on 07/12/2015) 30 tablet 0  . torsemide (DEMADEX) 20 MG tablet Take 2 tablets (40 mg total) by mouth 2 (two) times daily. 120 tablet 3   No current facility-administered medications for this visit.   Allergies:  Other; Strawberry extract; Sulfa antibiotics; and Tape   Social History: The patient  reports that she quit smoking about 5 years ago. Her smoking use included Cigarettes. She started smoking about 59 years ago. She has a 150 pack-year smoking history. She has never used smokeless tobacco. She reports that she drinks alcohol. She reports that she uses illicit drugs (Marijuana).   ROS:  Please see the history of present illness. Otherwise, complete review of systems is positive for chronic fatigue.  All other systems are reviewed and negative.   Physical Exam: VS:  BP 138/55 mmHg  Pulse 52  Ht _0  (1.676 m)  Wt 212 lb  3.2 oz (96.253 kg)  BMI 34.27 kg/m2  SpO2 94%, BMI Body mass index is 34.27 kg/(m^2).  Wt Readings from Last 3 Encounters:  07/12/15 212 lb 3.2 oz (96.253 kg)  05/28/15 207 lb 6.4 oz (94.076 kg)  03/19/15 196 lb 3.2 oz (88.996 kg)    Chronically ill-appearing woman, no distress. Wearing oxygen. HEENT: Conjunctiva and lids normal, oropharynx clear. Neck: Supple, no elevated JVP, bilateral CEA scars, no thyromegaly. Lungs: Scattered basilar rhonchi and decreased breath sounds at the right lower zone, Pleurx catheter in place, nonlabored breathing at rest. Cardiac: Regular rate and rhythm, no S3, soft systolic murmur, no pericardial rub. Abdomen: Protuberant, bowel sounds present, no guarding or rebound. Extremities: 2+ edema and stasis, distal pulses 1+.  ECG: I personally reviewed the tracing from 10/12/2014 which showed sinus bradycardia with sinus arrhythmia and IVCD.  Recent Labwork: 10/10/2014: B Natriuretic Peptide 914.0* 10/12/2014: ALT 23; AST 24; Magnesium 2.1; TSH 2.210 10/16/2014: Hemoglobin 9.0*; Platelets 269 10/18/2014: BUN 32*; Creatinine, Ser 1.02*; Potassium 4.7; Sodium 133*     Component Value Date/Time   CHOL 114 10/13/2010 0401   TRIG 96 10/13/2010 0401   HDL 31* 10/13/2010 0401   CHOLHDL 3.7 10/13/2010 0401   VLDL 19 10/13/2010 0401   LDLCALC 64 10/13/2010 0401  June 2017: BUN 71, creatinine 1.9, potassium 5.2  Other Studies Reviewed Today:  Echocardiogram 09/09/2014: Study Conclusions  - Left ventricle: The cavity size was normal. Wall thickness was normal. Systolic function was normal. The estimated ejection fraction was in the range of 50% to 55%. Wall motion was normal; there were no regional wall motion abnormalities. - Left atrium: The atrium was mildly dilated.  Assessment and Plan:  1. Acute on chronic diastolic heart failure. Increase Demadex at least to 40 mg twice daily for now. She has follow-up lab work with Dr. Woody Seller this week and then a  visit thereafter. I think we are going to have to tolerate some degree of renal insufficiency to keep her on adequate enough dose of diuretics to maintain reasonable volume  status. She may require hospitalization with IV diuresis.  2. Multivessel CAD status post CABG, no active angina symptoms.  3. Acute on chronic renal insufficiency, last creatinine was 1.9. For follow-up BMET later this week.  Current medicines were reviewed with the patient today.  Disposition: Follow-up with me in one month.  Signed, Satira Sark, MD, [facility] 07/12/2015 1:52 PM    [place] [facility] [address], [place], [place] [address] Phone: [phone]; Fax: [phone]

## 2015-07-12 NOTE — Patient Instructions (Signed)
Your physician recommends that you schedule a follow-up appointment in: [place]  Your physician has recommended you make the following change in your medication:   INCREASE DEMADEX 40 MG (2 TABLETS) TWICE DAILY  Thank you for choosing [facility]!!

## 2015-07-14 DIAGNOSIS — Z79899 Other long term (current) drug therapy: Secondary | ICD-10-CM | POA: Diagnosis not present

## 2015-07-20 DIAGNOSIS — J918 Pleural effusion in other conditions classified elsewhere: Secondary | ICD-10-CM | POA: Diagnosis not present

## 2015-07-23 ENCOUNTER — Encounter (HOSPITAL_COMMUNITY): Payer: Self-pay | Admitting: Emergency Medicine

## 2015-07-23 ENCOUNTER — Emergency Department (HOSPITAL_COMMUNITY): Payer: Medicare Other

## 2015-07-23 ENCOUNTER — Inpatient Hospital Stay (HOSPITAL_COMMUNITY)
Admission: EM | Admit: 2015-07-23 | Discharge: 2015-08-01 | DRG: 291 | Disposition: A | Discharge status: Home Health | Payer: Medicare Other | Attending: Internal Medicine | Admitting: Internal Medicine

## 2015-07-23 DIAGNOSIS — Z7982 Long term (current) use of aspirin: Secondary | ICD-10-CM | POA: Diagnosis not present

## 2015-07-23 DIAGNOSIS — Z8673 Personal history of transient ischemic attack (TIA), and cerebral infarction without residual deficits: Secondary | ICD-10-CM

## 2015-07-23 DIAGNOSIS — E1142 Type 2 diabetes mellitus with diabetic polyneuropathy: Secondary | ICD-10-CM | POA: Diagnosis not present

## 2015-07-23 DIAGNOSIS — E1165 Type 2 diabetes mellitus with hyperglycemia: Secondary | ICD-10-CM | POA: Diagnosis not present

## 2015-07-23 DIAGNOSIS — I872 Venous insufficiency (chronic) (peripheral): Secondary | ICD-10-CM | POA: Diagnosis present

## 2015-07-23 DIAGNOSIS — N179 Acute kidney failure, unspecified: Secondary | ICD-10-CM | POA: Diagnosis present

## 2015-07-23 DIAGNOSIS — Z9981 Dependence on supplemental oxygen: Secondary | ICD-10-CM | POA: Diagnosis not present

## 2015-07-23 DIAGNOSIS — I509 Heart failure, unspecified: Secondary | ICD-10-CM | POA: Insufficient documentation

## 2015-07-23 DIAGNOSIS — Z79899 Other long term (current) drug therapy: Secondary | ICD-10-CM

## 2015-07-23 DIAGNOSIS — I130 Hypertensive heart and chronic kidney disease with heart failure and stage 1 through stage 4 chronic kidney disease, or unspecified chronic kidney disease: Principal | ICD-10-CM | POA: Diagnosis present

## 2015-07-23 DIAGNOSIS — I739 Peripheral vascular disease, unspecified: Secondary | ICD-10-CM | POA: Diagnosis not present

## 2015-07-23 DIAGNOSIS — J90 Pleural effusion, not elsewhere classified: Secondary | ICD-10-CM | POA: Diagnosis not present

## 2015-07-23 DIAGNOSIS — R001 Bradycardia, unspecified: Secondary | ICD-10-CM | POA: Diagnosis not present

## 2015-07-23 DIAGNOSIS — M25551 Pain in right hip: Secondary | ICD-10-CM | POA: Diagnosis present

## 2015-07-23 DIAGNOSIS — E782 Mixed hyperlipidemia: Secondary | ICD-10-CM | POA: Diagnosis present

## 2015-07-23 DIAGNOSIS — Z66 Do not resuscitate: Secondary | ICD-10-CM | POA: Diagnosis present

## 2015-07-23 DIAGNOSIS — E1122 Type 2 diabetes mellitus with diabetic chronic kidney disease: Secondary | ICD-10-CM | POA: Diagnosis present

## 2015-07-23 DIAGNOSIS — J9621 Acute and chronic respiratory failure with hypoxia: Secondary | ICD-10-CM | POA: Diagnosis present

## 2015-07-23 DIAGNOSIS — Z8249 Family history of ischemic heart disease and other diseases of the circulatory system: Secondary | ICD-10-CM

## 2015-07-23 DIAGNOSIS — Z833 Family history of diabetes mellitus: Secondary | ICD-10-CM | POA: Diagnosis not present

## 2015-07-23 DIAGNOSIS — I252 Old myocardial infarction: Secondary | ICD-10-CM

## 2015-07-23 DIAGNOSIS — Z794 Long term (current) use of insulin: Secondary | ICD-10-CM | POA: Diagnosis not present

## 2015-07-23 DIAGNOSIS — R600 Localized edema: Secondary | ICD-10-CM | POA: Diagnosis not present

## 2015-07-23 DIAGNOSIS — Z7902 Long term (current) use of antithrombotics/antiplatelets: Secondary | ICD-10-CM | POA: Diagnosis not present

## 2015-07-23 DIAGNOSIS — I2510 Atherosclerotic heart disease of native coronary artery without angina pectoris: Secondary | ICD-10-CM | POA: Diagnosis present

## 2015-07-23 DIAGNOSIS — N183 Chronic kidney disease, stage 3 (moderate): Secondary | ICD-10-CM | POA: Diagnosis not present

## 2015-07-23 DIAGNOSIS — E118 Type 2 diabetes mellitus with unspecified complications: Secondary | ICD-10-CM

## 2015-07-23 DIAGNOSIS — Z8701 Personal history of pneumonia (recurrent): Secondary | ICD-10-CM

## 2015-07-23 DIAGNOSIS — Z299 Encounter for prophylactic measures, unspecified: Secondary | ICD-10-CM | POA: Diagnosis not present

## 2015-07-23 DIAGNOSIS — M25552 Pain in left hip: Secondary | ICD-10-CM | POA: Diagnosis present

## 2015-07-23 DIAGNOSIS — K5900 Constipation, unspecified: Secondary | ICD-10-CM | POA: Diagnosis not present

## 2015-07-23 DIAGNOSIS — Z951 Presence of aortocoronary bypass graft: Secondary | ICD-10-CM | POA: Diagnosis not present

## 2015-07-23 DIAGNOSIS — I779 Disorder of arteries and arterioles, unspecified: Secondary | ICD-10-CM | POA: Diagnosis present

## 2015-07-23 DIAGNOSIS — I5033 Acute on chronic diastolic (congestive) heart failure: Secondary | ICD-10-CM | POA: Diagnosis not present

## 2015-07-23 DIAGNOSIS — N184 Chronic kidney disease, stage 4 (severe): Secondary | ICD-10-CM | POA: Diagnosis present

## 2015-07-23 DIAGNOSIS — I255 Ischemic cardiomyopathy: Secondary | ICD-10-CM | POA: Diagnosis present

## 2015-07-23 DIAGNOSIS — I10 Essential (primary) hypertension: Secondary | ICD-10-CM | POA: Diagnosis present

## 2015-07-23 DIAGNOSIS — IMO0002 Reserved for concepts with insufficient information to code with codable children: Secondary | ICD-10-CM | POA: Diagnosis present

## 2015-07-23 DIAGNOSIS — R0600 Dyspnea, unspecified: Secondary | ICD-10-CM | POA: Diagnosis not present

## 2015-07-23 DIAGNOSIS — Z87891 Personal history of nicotine dependence: Secondary | ICD-10-CM

## 2015-07-23 DIAGNOSIS — R0602 Shortness of breath: Secondary | ICD-10-CM | POA: Diagnosis not present

## 2015-07-23 DIAGNOSIS — Z801 Family history of malignant neoplasm of trachea, bronchus and lung: Secondary | ICD-10-CM

## 2015-07-23 DIAGNOSIS — I110 Hypertensive heart disease with heart failure: Secondary | ICD-10-CM | POA: Diagnosis not present

## 2015-07-23 DIAGNOSIS — Z6838 Body mass index (BMI) 38.0-38.9, adult: Secondary | ICD-10-CM | POA: Diagnosis not present

## 2015-07-23 DIAGNOSIS — R350 Frequency of micturition: Secondary | ICD-10-CM | POA: Diagnosis not present

## 2015-07-23 LAB — CBC WITH DIFFERENTIAL/PLATELET
BASOS PCT: 0 %
Basophils Absolute: 0 10*3/uL (ref 0.0–0.1)
Eosinophils Absolute: 0.1 10*3/uL (ref 0.0–0.7)
Eosinophils Relative: 2 %
HEMATOCRIT: 34.6 % — AB (ref 36.0–46.0)
HEMOGLOBIN: 10.3 g/dL — AB (ref 12.0–15.0)
LYMPHS ABS: 1 10*3/uL (ref 0.7–4.0)
LYMPHS PCT: 13 %
MCH: 26.8 pg (ref 26.0–34.0)
MCHC: 29.8 g/dL — AB (ref 30.0–36.0)
MCV: 89.9 fL (ref 78.0–100.0)
MONO ABS: 0.7 10*3/uL (ref 0.1–1.0)
MONOS PCT: 9 %
NEUTROS ABS: 5.6 10*3/uL (ref 1.7–7.7)
NEUTROS PCT: 76 %
Platelets: 186 10*3/uL (ref 150–400)
RBC: 3.85 MIL/uL — ABNORMAL LOW (ref 3.87–5.11)
RDW: 15.7 % — AB (ref 11.5–15.5)
WBC: 7.4 10*3/uL (ref 4.0–10.5)

## 2015-07-23 LAB — BASIC METABOLIC PANEL
Anion gap: 5 (ref 5–15)
BUN: 46 mg/dL — ABNORMAL HIGH (ref 6–20)
CHLORIDE: 99 mmol/L — AB (ref 101–111)
CO2: 30 mmol/L (ref 22–32)
CREATININE: 1.78 mg/dL — AB (ref 0.44–1.00)
Calcium: 8.7 mg/dL — ABNORMAL LOW (ref 8.9–10.3)
GFR calc non Af Amer: 28 mL/min — ABNORMAL LOW (ref >60)
GFR, EST AFRICAN AMERICAN: 33 mL/min — AB (ref >60)
GLUCOSE: 231 mg/dL — AB (ref 65–99)
Potassium: 5 mmol/L (ref 3.5–5.1)
Sodium: 134 mmol/L — ABNORMAL LOW (ref 135–145)

## 2015-07-23 LAB — TROPONIN I
Troponin I: 0.03 ng/mL (ref <0.03)
Troponin I: 0.03 ng/mL (ref <0.03)

## 2015-07-23 LAB — BRAIN NATRIURETIC PEPTIDE: B Natriuretic Peptide: 654 pg/mL — ABNORMAL HIGH (ref 0.0–100.0)

## 2015-07-23 MED ORDER — ATORVASTATIN CALCIUM 40 MG PO TABS
40.0000 mg | ORAL_TABLET | Freq: Every day | ORAL | Status: DC
  Administered 2015-07-23 – 2015-07-31 (×9): 40 mg via ORAL
  Filled 2015-07-23 (×9): qty 1

## 2015-07-23 MED ORDER — FUROSEMIDE 10 MG/ML IJ SOLN
60.0000 mg | Freq: Once | INTRAMUSCULAR | Status: AC
  Administered 2015-07-23: 60 mg via INTRAVENOUS
  Filled 2015-07-23: qty 6

## 2015-07-23 MED ORDER — INSULIN DETEMIR 100 UNIT/ML ~~LOC~~ SOLN
7.0000 [IU] | Freq: Every day | SUBCUTANEOUS | Status: DC
  Administered 2015-07-23 – 2015-07-31 (×9): 7 [IU] via SUBCUTANEOUS
  Filled 2015-07-23 (×10): qty 0.07

## 2015-07-23 MED ORDER — CLOPIDOGREL BISULFATE 75 MG PO TABS
75.0000 mg | ORAL_TABLET | Freq: Once | ORAL | Status: AC
  Administered 2015-07-23: 75 mg via ORAL
  Filled 2015-07-23: qty 1

## 2015-07-23 MED ORDER — ALBUTEROL SULFATE (2.5 MG/3ML) 0.083% IN NEBU: 3.0000 mL | INHALATION_SOLUTION | Freq: Every day | RESPIRATORY_TRACT | Status: DC | PRN

## 2015-07-23 MED ORDER — SODIUM CHLORIDE 0.9% FLUSH
3.0000 mL | Freq: Two times a day (BID) | INTRAVENOUS | Status: DC
  Administered 2015-07-23 – 2015-07-31 (×16): 3 mL via INTRAVENOUS

## 2015-07-23 MED ORDER — ONDANSETRON HCL 4 MG/2ML IJ SOLN
4.0000 mg | Freq: Four times a day (QID) | INTRAMUSCULAR | Status: DC | PRN
  Administered 2015-07-24: 4 mg via INTRAVENOUS
  Filled 2015-07-23: qty 2

## 2015-07-23 MED ORDER — HYDROCODONE-ACETAMINOPHEN 5-325 MG PO TABS
1.0000 | ORAL_TABLET | Freq: Four times a day (QID) | ORAL | Status: DC | PRN
  Administered 2015-07-24 (×2): 1 via ORAL
  Filled 2015-07-23 (×2): qty 1

## 2015-07-23 MED ORDER — AMLODIPINE BESYLATE 5 MG PO TABS
10.0000 mg | ORAL_TABLET | Freq: Every day | ORAL | Status: DC
  Administered 2015-07-24 – 2015-08-01 (×9): 10 mg via ORAL
  Filled 2015-07-23 (×9): qty 2

## 2015-07-23 MED ORDER — HYDRALAZINE HCL 25 MG PO TABS
25.0000 mg | ORAL_TABLET | Freq: Three times a day (TID) | ORAL | Status: DC
  Administered 2015-07-23 – 2015-08-01 (×24): 25 mg via ORAL
  Filled 2015-07-23 (×27): qty 1

## 2015-07-23 MED ORDER — ACETAMINOPHEN 500 MG PO TABS
1000.0000 mg | ORAL_TABLET | Freq: Four times a day (QID) | ORAL | Status: DC | PRN
  Administered 2015-07-24: 1000 mg via ORAL
  Filled 2015-07-23: qty 2

## 2015-07-23 MED ORDER — GLIMEPIRIDE 2 MG PO TABS
2.0000 mg | ORAL_TABLET | Freq: Two times a day (BID) | ORAL | Status: DC
  Administered 2015-07-24 – 2015-07-28 (×9): 2 mg via ORAL
  Filled 2015-07-23 (×9): qty 1

## 2015-07-23 MED ORDER — INSULIN ASPART 100 UNIT/ML ~~LOC~~ SOLN
3.0000 [IU] | Freq: Three times a day (TID) | SUBCUTANEOUS | Status: DC
  Administered 2015-07-24 – 2015-07-30 (×11): 3 [IU] via SUBCUTANEOUS

## 2015-07-23 MED ORDER — ASPIRIN EC 81 MG PO TBEC
81.0000 mg | DELAYED_RELEASE_TABLET | Freq: Every day | ORAL | Status: DC
  Administered 2015-07-24 – 2015-08-01 (×9): 81 mg via ORAL
  Filled 2015-07-23 (×9): qty 1

## 2015-07-23 MED ORDER — SODIUM CHLORIDE 0.9% FLUSH: 3.0000 mL | INTRAVENOUS | Status: DC | PRN

## 2015-07-23 MED ORDER — NITROGLYCERIN 0.4 MG SL SUBL: 0.4000 mg | SUBLINGUAL_TABLET | SUBLINGUAL | Status: DC | PRN

## 2015-07-23 MED ORDER — ENOXAPARIN SODIUM 40 MG/0.4ML ~~LOC~~ SOLN
40.0000 mg | SUBCUTANEOUS | Status: DC
  Administered 2015-07-23 – 2015-07-28 (×6): 40 mg via SUBCUTANEOUS
  Filled 2015-07-23 (×6): qty 0.4

## 2015-07-23 MED ORDER — CITALOPRAM HYDROBROMIDE 20 MG PO TABS
40.0000 mg | ORAL_TABLET | Freq: Every day | ORAL | Status: DC
  Administered 2015-07-24 – 2015-08-01 (×9): 40 mg via ORAL
  Filled 2015-07-23 (×9): qty 2

## 2015-07-23 MED ORDER — FUROSEMIDE 10 MG/ML IJ SOLN
60.0000 mg | Freq: Two times a day (BID) | INTRAMUSCULAR | Status: DC
  Administered 2015-07-24 – 2015-07-29 (×11): 60 mg via INTRAVENOUS
  Filled 2015-07-23 (×11): qty 6

## 2015-07-23 MED ORDER — ADULT MULTIVITAMIN W/MINERALS CH
1.0000 | ORAL_TABLET | Freq: Every day | ORAL | Status: DC
  Administered 2015-07-24 – 2015-08-01 (×9): 1 via ORAL
  Filled 2015-07-23 (×9): qty 1

## 2015-07-23 MED ORDER — ACETAMINOPHEN 325 MG PO TABS: 650.0000 mg | ORAL_TABLET | ORAL | Status: DC | PRN

## 2015-07-23 MED ORDER — METOLAZONE 5 MG PO TABS: 2.5000 mg | ORAL_TABLET | Freq: Every day | ORAL | Status: DC | PRN

## 2015-07-23 MED ORDER — ISOSORBIDE MONONITRATE ER 60 MG PO TB24
30.0000 mg | ORAL_TABLET | Freq: Every evening | ORAL | Status: DC
  Administered 2015-07-23 – 2015-07-31 (×9): 30 mg via ORAL
  Filled 2015-07-23 (×9): qty 1

## 2015-07-23 MED ORDER — CARVEDILOL 3.125 MG PO TABS
3.1250 mg | ORAL_TABLET | Freq: Two times a day (BID) | ORAL | Status: DC
  Administered 2015-07-24 – 2015-07-26 (×3): 3.125 mg via ORAL
  Filled 2015-07-23 (×4): qty 1

## 2015-07-23 MED ORDER — SODIUM CHLORIDE 0.9 % IV SOLN: 250.0000 mL | INTRAVENOUS | Status: DC | PRN

## 2015-07-23 MED ORDER — LISINOPRIL 10 MG PO TABS
10.0000 mg | ORAL_TABLET | Freq: Every day | ORAL | Status: DC
  Administered 2015-07-24 – 2015-07-29 (×5): 10 mg via ORAL
  Filled 2015-07-23 (×6): qty 1

## 2015-07-23 NOTE — ED Notes (Signed)
Patient states "the doctor took me off my fluid medicine but put me back on it 2 weeks ago and told me if the weight did not come back off in 2 weeks to come to the ER." Patient complaining of swelling to bilateral legs. States she has gained 34 lbs in 2 weeks.

## 2015-07-23 NOTE — ED Provider Notes (Signed)
CSN: [id number]     Arrival date & time 07/23/15  1556 History   First MD Initiated Contact with Patient 07/23/15 1715     Chief Complaint  Patient presents with  . Leg Swelling     HPI Patient has known history of congestive heart failure presents emergency department complaining of worsening lower extremity edema and abdominal wall edema as well as increasing shortness of breath.  She reports the shortness of breath is been present over the past week.  She has a history of congestive heart failure and has a palliative tunneled Pleurx catheter in her right pleural space for recurrent right-sided pleural effusion.  Family reports decreased output from the Pleurx catheter.  Patient has been compliant with her medications including the recent increased dose of her Demadex as initiated by her cardiologist.  Despite all this she has had an 18 pound weight gain since the beginning of July when she last saw her cardiologist on July 3.  She does admit to drinking approximately 64 ounces of water daily.     Past Medical History  Diagnosis Date  . Mixed hyperlipidemia   . Essential hypertension, benign   . Depression   . Cellulitis     a. Recurrent, bilateral legs  . Carotid artery disease (HCC)     a. Bilateral CEA; 50-60% bilateral ICA stenosis, 11/12  . PAD (peripheral artery disease) ([place])   . Suicide attempt ([place]) 08/2002  . TIA (transient ischemic attack)   . TMJ syndrome   . Herniated disc   . Ischemic cardiomyopathy     a. 11/2010 TEE: EF 40-45%.  . Critical lower limb ischemia   . Venous insufficiency   . Coronary atherosclerosis of native coronary artery     a. 11/2010 NSTEMI/CABG x 4: L-LAD; S-PL; S-OM; S-DX, by PVT.  [name] NSTEMI (non-ST elevated myocardial infarction) ([place]) 11/2010  . Pneumonia 2000's X 2  . Type 2 diabetes mellitus ([place])   . History of blood transfusion   . Migraine   . Stroke ([place]) 08/2009  . Arthritis   . Chronic back pain   . On home oxygen therapy   .  Chronic diastolic heart failure ([place])   . CHF (congestive heart failure) [facility])    Past Surgical History  Procedure Laterality Date  . Appendectomy    . Cholecystectomy    . Abdominal hysterectomy    . Carotid endarterectomy Bilateral 2011    Bilateral - Dr. Kellie Simmering  . Wrist fracture surgery Left     "grafted bone from hip to wrist"  . Coronary artery bypass graft  11/24/2010    Procedure: CORONARY ARTERY BYPASS GRAFTING (CABG);  Surgeon: Tharon Aquas Adelene Idler, MD;  Location: [place];  Service: Open Heart Surgery;  Laterality: N/A;  Coronary Artery Bypass Graft times four on pump utilizing left internal mammary artery and bilateral greater saphenous veins harvested endoscopically, transesophageal echocardiogram   . Toe surgery Left     "put pin in 2nd toe"  . Lower extremity angiogram  09/08/2012; 10/06/2013    "found 100% blockage; unsuccessful attempt at crossing a chronic total occlusion of the left SFA in the setting of critical limb ischemia  . Dilation and curettage of uterus    . Tubal ligation    . Debridement toe Left     "nonhealing wound; 3rd digit  . Cardiac catheterization  11/2010  . Coronary angioplasty with stent placement  03/2013    "1"  . Colonoscopy w/ biopsies and polypectomy    .  Fracture surgery    . Femoral-popliteal bypass graft Left 10/07/2013    Procedure: LEFT FEMORAL-POPLITEAL ARTERY BYPASS GRAFT;  Surgeon: Angelia Mould, MD;  Location: [place];  Service: Vascular;  Laterality: Left;  . Intraoperative arteriogram Left 10/07/2013    Procedure: INTRA OPERATIVE ARTERIOGRAM LEFT LEG;  Surgeon: Angelia Mould, MD;  Location: [place];  Service: Vascular;  Laterality: Left;  . Endarterectomy popliteal Left 10/07/2013    Procedure: LEFT POPLITEAL ENDARTERECTOMY ;  Surgeon: Angelia Mould, MD;  Location: [facility];  Service: Vascular;  Laterality: Left;  . Left and right heart catheterization with coronary/graft angiogram N/A 03/27/2013    Procedure: LEFT AND  RIGHT HEART CATHETERIZATION WITH Beatrix Fetters;  Surgeon: Burnell Blanks, MD;  Location: [facility] CATH LAB;  Service: Cardiovascular;  Laterality: N/A;  . Lower extremity angiogram Bilateral 09/08/2013    Procedure: LOWER EXTREMITY ANGIOGRAM;  Surgeon: [name], MD;  Location: [place] CATH LAB;  Service: Cardiovascular;  Laterality: Bilateral;   Family History  Problem Relation Age of Onset  . Coronary artery disease Father     Died with MI age 107  . Heart disease Father     before age 59  . Heart attack Father   . Diabetes type II Mother   . Hypertension Mother   . Diabetes Mother   . Heart disease Mother   . Hyperlipidemia Mother   . Heart failure Sister   . Cancer Sister   . Cancer Brother     Lung cancer  . Diabetes Daughter   . Hyperlipidemia Daughter   . Diabetes Son   . Hyperlipidemia Son    Social History  Substance Use Topics  . Smoking status: Former Smoker -- 3.00 packs/day for 50 years    Types: Cigarettes    Start date: 01/24/1956    Quit date: 10/09/2009  . Smokeless tobacco: Never Used  . Alcohol Use: 0.0 oz/week    0 Standard drinks or equivalent per week     Comment: 10/06/2013 "might have a drink q 2-3 months"   OB History    No data available     Review of Systems  All other systems reviewed and are negative.     Allergies  Other; Strawberry extract; Sulfa antibiotics; and Tape  Home Medications   Prior to Admission medications   Medication Sig Start Date End Date Taking? Authorizing Provider  acetaminophen (TYLENOL) 500 MG tablet Take 1,000 mg by mouth every 6 (six) hours as needed for moderate pain.    Yes Historical Provider, MD  albuterol (PROVENTIL HFA;VENTOLIN HFA) 108 (90 BASE) MCG/ACT inhaler Inhale 2 puffs into the lungs daily as needed for wheezing or shortness of breath.    Yes Historical Provider, MD  amLODipine (NORVASC) 10 MG tablet Take 1 tablet (10 mg total) by mouth daily. 07/09/14  Yes Satira Sark, MD   aspirin 81 MG EC tablet Take 81 mg by mouth daily. 03/29/13  Yes Brittainy Erie Noe, PA-C  atorvastatin (LIPITOR) 40 MG tablet Take 40 mg by mouth daily at 6 PM. 03/29/13  Yes Brittainy Erie Noe, PA-C  carvedilol (COREG) 3.125 MG tablet Take 1 tablet (3.125 mg total) by mouth 2 (two) times daily with a meal. 03/19/15  Yes Satira Sark, MD  citalopram (CELEXA) 40 MG tablet Take 1 tablet by mouth daily. 05/20/15  Yes Historical Provider, MD  clopidogrel (PLAVIX) 75 MG tablet TAKE ONE TABLET BY MOUTH ONCE DAILY WITH BREAKFAST 04/12/15  Yes Mikeal Hawthorne  Delman Kitten, MD  Docusate Calcium (STOOL SOFTENER PO) Take 100 mg by mouth daily as needed (for constipation).    Yes Historical Provider, MD  gabapentin (NEURONTIN) 300 MG capsule Take one tab (327m) by mouth every morning & 2 tabs (6047m every evening at bedtime 08/02/14  Yes Historical Provider, MD  glimepiride (AMARYL) 2 MG tablet Take 2 mg by mouth 2 (two) times daily.  11/09/14  Yes Historical Provider, MD  hydrALAZINE (APRESOLINE) 25 MG tablet Take 25 mg by mouth 3 (three) times daily.   Yes Historical Provider, MD  HYDROcodone-acetaminophen (NORCO/VICODIN) 5-325 MG tablet Take 1 tablet by mouth every 6 (six) hours as needed for moderate pain. 10/19/14  Yes Costin M Karlyne GreenspanMD  insulin aspart (NOVOLOG) 100 UNIT/ML injection Inject 3 Units into the skin 3 (three) times daily with meals. 10/19/14  Yes Costin M Karlyne GreenspanMD  insulin detemir (LEVEMIR) 100 UNIT/ML injection Inject 0.07 mLs (7 Units total) into the skin at bedtime. 10/19/14  Yes Costin M Karlyne GreenspanMD  isosorbide mononitrate (IMDUR) 30 MG 24 hr tablet Take 30 mg by mouth every evening.   Yes Historical Provider, MD  lisinopril (PRINIVIL,ZESTRIL) 10 MG tablet Take 1 tablet (10 mg total) by mouth daily. 12/21/14  Yes SaSatira SarkMD  Multiple Vitamin (MULTIVITAMIN WITH MINERALS) TABS tablet Take 1 tablet by mouth daily.   Yes Historical Provider, MD  Multiple Vitamins-Minerals (HAIR SKIN  NAILS PO) Take 1 tablet by mouth daily.   Yes Historical Provider, MD  nitroGLYCERIN (NITROSTAT) 0.4 MG SL tablet Place 1 tablet (0.4 mg total) under the tongue every 5 (five) minutes as needed for chest pain. Up to 3 doses. If no relief after 3rd dose, proceed to the ED for an evaluation 09/30/14  Yes SaSatira SarkMD  Probiotic Product (PROBIOTIC DAILY PO) Take 0.5 tablets by mouth 2 (two) times daily.   Yes Historical Provider, MD  torsemide (DEMADEX) 20 MG tablet Take 2 tablets (40 mg total) by mouth 2 (two) times daily. 07/12/15  Yes SaSatira SarkMD  Umeclidinium-Vilanterol 62.5-25 MCG/INH AEPB Inhale 1 application into the lungs daily. As needed   Yes Historical Provider, MD  metolazone (ZAROXOLYN) 2.5 MG tablet Take 1 tablet (2.5 mg total) by mouth as directed. For the next week until weight is back down, then resume previous directions as directed Patient not taking: Reported on 07/12/2015 05/28/15   SaSatira SarkMD   BP 170/57 mmHg  Pulse 52  Temp(Src) 98.9 F (37.2 C) (Temporal)  Resp 18  Ht 5' 6" (1.676 m)  Wt 230 lb (104.327 kg)  BMI 37.14 kg/m2  SpO2 92% Physical Exam  Constitutional: She is oriented to person, place, and time. She appears well-developed and well-nourished. No distress.  HENT:  Head: Normocephalic and atraumatic.  Eyes: EOM are normal.  Neck: Normal range of motion.  Cardiovascular: Normal rate, regular rhythm and normal heart sounds.   Pulmonary/Chest: Effort normal. She has rales.  Abdominal: Soft. She exhibits no distension. There is no tenderness.  Musculoskeletal: Normal range of motion.  3+ pitting edema bilaterally throughout her lower extremities as well as 1+ edema of the abdominal wall.  Neurological: She is alert and oriented to person, place, and time.  Skin: Skin is warm and dry.  Psychiatric: She has a normal mood and affect. Judgment normal.  Nursing note and vitals reviewed.   ED Course  Procedures (including critical care  time) Labs Review Labs Reviewed  CBC WITH DIFFERENTIAL/PLATELET -  Abnormal; Notable for the following:    RBC 3.85 (*)    Hemoglobin 10.3 (*)    HCT 34.6 (*)    MCHC 29.8 (*)    RDW 15.7 (*)    All other components within normal limits  BASIC METABOLIC PANEL - Abnormal; Notable for the following:    Sodium 134 (*)    Chloride 99 (*)    Glucose, Bld 231 (*)    BUN 46 (*)    Creatinine, Ser 1.78 (*)    Calcium 8.7 (*)    GFR calc non Af Amer 28 (*)    GFR calc Af Amer 33 (*)    All other components within normal limits  BRAIN NATRIURETIC PEPTIDE - Abnormal; Notable for the following:    B Natriuretic Peptide 654.0 (*)    All other components within normal limits  TROPONIN I - Abnormal; Notable for the following:    Troponin I 0.03 (*)    All other components within normal limits    Imaging Review Dg Chest 2 View  07/23/2015  CLINICAL DATA:  Shortness of breath and CHF. EXAM: CHEST  2 VIEW COMPARISON:  10/16/2014 FINDINGS: AP and lateral views of the chest were obtained. The cardio pericardial silhouette is enlarged. There is pulmonary vascular congestion without overt pulmonary edema. Interstitial opacity at the bases suggests edema. Small right pleural effusion noted. Bones are diffusely demineralized. Patient is status post CABG. IMPRESSION: Cardiomegaly with vascular congestion and probable interstitial pulmonary edema. Small right pleural effusion. Electronically Signed   By: Misty Stanley M.D.   On: 07/23/2015 19:06   I have personally reviewed and evaluated these images and lab results as part of my medical decision-making.   EKG Interpretation None      MDM   Final diagnoses:  None    Patient be admitted for ongoing IV diuresis as she has failed outpatient management of her congestive heart failure.  I think a strong suggestion is to be possible fluid restriction as 64 ounces of water daily seems rather excessive to me.  This will need to be verified with her  cardiologist    Jola Schmidt, MD 07/23/15 2038

## 2015-07-23 NOTE — H&P (Signed)
History and Physical    [name] [name] LNL:[id number] DOB: November 30, 1945 DOA: 07/23/2015  PCP: Glenda Chroman, MD  Patient coming from: home  Chief Complaint: swelling , sob  HPI: [name] [name] is a 70 y.o. female with medical history significant of CHF, CRF on 2 liters [place] at home, right pleural catheter for chronic right pleural effusion, DM, HTN, CAD comes in with over 2 weeks of worsening swelling in her legs and pannus, about 18 lbs wt gain in a month, worsening sob and PND at home for last 2 weeks.  Pt reports about a month ago her PCP stopped her diuretics because her Cr was going up.  She started getting worse then.  Last week she saw her cardiologist who restarted her demedex at 45m bid which is what she is on chronically but her zaraxolyn appears to not have been restarted.  Pt denies any fevers.  No cough.  She has been still on her 2 liters [place] at home and has not increased this.  She was told to drink less than 2 liters (64 ounces) of water a day and she has been drinking this daily.  Pt referred for admission for acute chf on chronic diastolic dysfunction.    ED Course: lasix 660miv once  Review of Systems: As per HPI otherwise 10 point review of systems negative.   Past Medical History  Diagnosis Date  . Mixed hyperlipidemia   . Essential hypertension, benign   . Depression   . Cellulitis     a. Recurrent, bilateral legs  . Carotid artery disease (HCC)     a. Bilateral CEA; 50-60% bilateral ICA stenosis, 11/12  . PAD [place]peripheral artery disease) ([facility] [place]  . Suicide attempt ([facility]/[place]  . TIA (transient ischemic attack)   . TMJ syndrome   . Herniated disc   . Ischemic cardiomyopathy     a. 11/2010 TEE: EF 40-45%.  . Critical lower limb ischemia   . Venous insufficiency   . Coronary atherosclerosis of native coronary artery     a. 11/2010 NSTEMI/CABG x 4: L-LAD; S-PL; S-OM; S-DX, by PVT.  . Marland KitchenSTEMI (non-ST elevated myocardial infarction) ([facility]/2012  . Pneumonia 2000's X 2    . Type 2 diabetes mellitus ([facility] [place]  . History of blood transfusion   . Migraine   . Stroke ([facility]  . Arthritis   . Chronic back pain   . On home oxygen therapy   . Chronic diastolic heart failure ([facility]  . [facility]    Past Surgical History  Procedure Laterality Date  . Appendectomy    . Cholecystectomy    . Abdominal hysterectomy    . Carotid endarterectomy Bilateral 2011    Bilateral - Dr. LaKellie Simmering. Wrist fracture surgery Left     "grafted bone from hip to wrist"  . Coronary artery bypass graft  11/24/2010    Procedure: CORONARY ARTERY BYPASS GRAFTING (CABG);  Surgeon: PeTharon AquasrAdelene IdlerMD;  [facility] Service: [facility];  Laterality: N/A;  Coronary Artery Bypass Graft times four on pump utilizing left internal mammary artery and bilateral greater saphenous veins harvested endoscopically, transesophageal echocardiogram   . Toe surgery Left     "put pin in 2nd toe"  . Lower extremity angiogram  09/08/2012; 10/06/2013    "found 100% blockage; unsuccessful attempt at crossing a chronic total occlusion of the left SFA in the setting of critical limb  ischemia  . Dilation and curettage of uterus    . Tubal ligation    . Debridement toe Left     "nonhealing wound; 3rd digit  . Cardiac catheterization  11/2010  . Coronary angioplasty with stent placement  03/2013    "1"  . Colonoscopy w/ biopsies and polypectomy    . Fracture surgery    . Femoral-popliteal bypass graft Left 10/07/2013    Procedure: LEFT FEMORAL-POPLITEAL ARTERY BYPASS GRAFT;  Surgeon: Angelia Mould, MD;  Location: [place];  Service: Vascular;  Laterality: Left;  . Intraoperative arteriogram Left 10/07/2013    Procedure: INTRA OPERATIVE ARTERIOGRAM LEFT LEG;  Surgeon: Angelia Mould, MD;  Location: [place];  Service: Vascular;  Laterality: Left;  . Endarterectomy popliteal Left 10/07/2013    Procedure: LEFT POPLITEAL ENDARTERECTOMY ;  Surgeon: Angelia Mould, MD;  Location: [place];  Service: Vascular;  Laterality: Left;  . Left and right heart catheterization with coronary/graft angiogram N/A 03/27/2013    Procedure: LEFT AND RIGHT HEART CATHETERIZATION WITH Beatrix Fetters;  Surgeon: Burnell Blanks, MD;  Location: [facility] CATH LAB;  Service: Cardiovascular;  Laterality: N/A;  . Lower extremity angiogram Bilateral 09/08/2013    Procedure: LOWER EXTREMITY ANGIOGRAM;  Surgeon: [name], MD;  [facility];  Service: Cardiovascular;  Laterality: Bilateral;     reports that she quit smoking about 5 years ago. Her smoking use included Cigarettes. She started smoking about 59 years ago. She has a 150 pack-year smoking history. She has never used smokeless tobacco. She reports that she drinks alcohol. She reports that she uses illicit drugs (Marijuana).  Allergies  Allergen Reactions  . Other Shortness Of Breath, Rash and Other (See Comments)    All berries   . Strawberry Extract Hives, Shortness Of Breath and Rash  . Sulfa Antibiotics Swelling    Bodily Swelling  . Tape Other (See Comments)    Tears skin.  Please use "paper" tape only.    Family History  Problem Relation Age of Onset  . Coronary artery disease Father     Died with MI age 15  . Heart disease Father     before age 16  . Heart attack Father   . Diabetes type II Mother   . Hypertension Mother   . Diabetes Mother   . Heart disease Mother   . Hyperlipidemia Mother   . Heart failure Sister   . Cancer Sister   . Cancer Brother     Lung cancer  . Diabetes Daughter   . Hyperlipidemia Daughter   . Diabetes Son   . Hyperlipidemia Son     Prior to Admission medications   Medication Sig Start Date End Date Taking? Authorizing Provider  acetaminophen (TYLENOL) 500 MG tablet Take 1,000 mg by mouth every 6 (six) hours as needed for moderate pain.    Yes Historical Provider, MD  albuterol (PROVENTIL HFA;VENTOLIN HFA) 108 (90 BASE) MCG/ACT inhaler  Inhale 2 puffs into the lungs daily as needed for wheezing or shortness of breath.    Yes Historical Provider, MD  amLODipine (NORVASC) 10 MG tablet Take 1 tablet (10 mg total) by mouth daily. 07/09/14  Yes Satira Sark, MD  aspirin 81 MG EC tablet Take 81 mg by mouth daily. 03/29/13  Yes Brittainy Erie Noe, PA-C  atorvastatin (LIPITOR) 40 MG tablet Take 40 mg by mouth daily at 6 PM. 03/29/13  Yes Brittainy Erie Noe, PA-C  carvedilol (COREG) 3.125 MG tablet  Take 1 tablet (3.125 mg total) by mouth 2 (two) times daily with a meal. 03/19/15  Yes Satira Sark, MD  citalopram (CELEXA) 40 MG tablet Take 1 tablet by mouth daily. 05/20/15  Yes Historical Provider, MD  clopidogrel (PLAVIX) 75 MG tablet TAKE ONE TABLET BY MOUTH ONCE DAILY WITH BREAKFAST 04/12/15  Yes Satira Sark, MD  Docusate Calcium (STOOL SOFTENER PO) Take 100 mg by mouth daily as needed (for constipation).    Yes Historical Provider, MD  gabapentin (NEURONTIN) 300 MG capsule Take one tab (351m) by mouth every morning & 2 tabs (6036m every evening at bedtime 08/02/14  Yes Historical Provider, MD  glimepiride (AMARYL) 2 MG tablet Take 2 mg by mouth 2 (two) times daily.  11/09/14  Yes Historical Provider, MD  hydrALAZINE (APRESOLINE) 25 MG tablet Take 25 mg by mouth 3 (three) times daily.   Yes Historical Provider, MD  HYDROcodone-acetaminophen (NORCO/VICODIN) 5-325 MG tablet Take 1 tablet by mouth every 6 (six) hours as needed for moderate pain. 10/19/14  Yes Costin M Karlyne GreenspanMD  insulin aspart (NOVOLOG) 100 UNIT/ML injection Inject 3 Units into the skin 3 (three) times daily with meals. 10/19/14  Yes Costin M Karlyne GreenspanMD  insulin detemir (LEVEMIR) 100 UNIT/ML injection Inject 0.07 mLs (7 Units total) into the skin at bedtime. 10/19/14  Yes Costin M Karlyne GreenspanMD  isosorbide mononitrate (IMDUR) 30 MG 24 hr tablet Take 30 mg by mouth every evening.   Yes Historical Provider, MD  lisinopril (PRINIVIL,ZESTRIL) 10 MG tablet Take 1  tablet (10 mg total) by mouth daily. 12/21/14  Yes SaSatira SarkMD  Multiple Vitamin (MULTIVITAMIN WITH MINERALS) TABS tablet Take 1 tablet by mouth daily.   Yes Historical Provider, MD  Multiple Vitamins-Minerals (HAIR SKIN NAILS PO) Take 1 tablet by mouth daily.   Yes Historical Provider, MD  nitroGLYCERIN (NITROSTAT) 0.4 MG SL tablet Place 1 tablet (0.4 mg total) under the tongue every 5 (five) minutes as needed for chest pain. Up to 3 doses. If no relief after 3rd dose, proceed to the ED for an evaluation 09/30/14  Yes SaSatira SarkMD  Probiotic Product (PROBIOTIC DAILY PO) Take 0.5 tablets by mouth 2 (two) times daily.   Yes Historical Provider, MD  torsemide (DEMADEX) 20 MG tablet Take 2 tablets (40 mg total) by mouth 2 (two) times daily. 07/12/15  Yes SaSatira SarkMD  Umeclidinium-Vilanterol 62.5-25 MCG/INH AEPB Inhale 1 application into the lungs daily. As needed   Yes Historical Provider, MD  metolazone (ZAROXOLYN) 2.5 MG tablet Take 1 tablet (2.5 mg total) by mouth as directed. For the next week until weight is back down, then resume previous directions as directed Patient not taking: Reported on 07/12/2015 05/28/15   SaSatira SarkMD    Physical Exam: Filed Vitals:   07/23/15 1825 07/23/15 1930 07/23/15 2000 07/23/15 2030  BP: 170/57 158/52 154/55 159/56  Pulse: 52 50 47 48  Temp:      TempSrc:      Resp: 18     Height:      Weight:      SpO2: 92% 93% 93% 92%      Constitutional: NAD, calm, comfortable Filed Vitals:   07/23/15 1825 07/23/15 1930 07/23/15 2000 07/23/15 2030  BP: 170/57 158/52 154/55 159/56  Pulse: 52 50 47 48  Temp:      TempSrc:      Resp: 18     Height:  Weight:      SpO2: 92% 93% 93% 92%   Eyes: PERRL, lids and conjunctivae normal ENMT: Mucous membranes are moist. Posterior pharynx clear of any exudate or lesions.Normal dentition.  Neck: normal, supple, no masses, no thyromegaly Respiratory: clear to auscultation bilaterally,  no wheezing, no crackles. Normal respiratory effort. No accessory muscle use. Rt pleural catheter to chest wall noted Cardiovascular: Regular rate and rhythm, no murmurs / rubs / gallops. 2+ extremity edema. 2+ pedal pulses. No carotid bruits.  Abdomen: no tenderness, no masses palpated. No hepatosplenomegaly. Bowel sounds positive.  Musculoskeletal: no clubbing / cyanosis. No joint deformity upper and lower extremities. Good ROM, no contractures. Normal muscle tone.  Skin: no rashes, lesions, ulcers. No induration Neurologic: CN 2-12 grossly intact. Sensation intact, DTR normal. Strength 5/5 in all 4.  Psychiatric: Normal judgment and insight. Alert and oriented x 3. Normal mood.    Labs on Admission: I have personally reviewed following labs and imaging studies  CBC:  Recent Labs Lab 07/23/15 1629  WBC 7.4  NEUTROABS 5.6  HGB 10.3*  HCT 34.6*  MCV 89.9  PLT 448   Basic Metabolic Panel:  Recent Labs Lab 07/23/15 1629  NA 134*  K 5.0  CL 99*  CO2 30  GLUCOSE 231*  BUN 46*  CREATININE 1.78*  CALCIUM 8.7*   GFR: Estimated Creatinine Clearance: 36.9 mL/min (by C-G formula based on Cr of 1.78).  Cardiac Enzymes:  Recent Labs Lab 07/23/15 1629  TROPONINI 0.03*   BNP (last 3 results) No results for input(s): PROBNP in the last 8760 hours.   Urine analysis:    Component Value Date/Time   COLORURINE YELLOW 10/10/2014 [place] 10/10/2014 2345   LABSPEC 1.010 10/10/2014 2345   PHURINE 6.0 10/10/2014 2345   GLUCOSEU NEGATIVE 10/10/2014 2345   HGBUR TRACE* 10/10/2014 2345   BILIRUBINUR NEGATIVE 10/10/2014 2345   KETONESUR NEGATIVE 10/10/2014 2345   PROTEINUR 30* 10/10/2014 2345   UROBILINOGEN 0.2 10/10/2014 2345   NITRITE NEGATIVE 10/10/2014 2345   LEUKOCYTESUR NEGATIVE 10/10/2014 2345    Radiological Exams on Admission: Dg Chest 2 View  07/23/2015  CLINICAL DATA:  Shortness of breath and CHF. EXAM: CHEST  2 VIEW COMPARISON:  10/16/2014  FINDINGS: AP and lateral views of the chest were obtained. The cardio pericardial silhouette is enlarged. There is pulmonary vascular congestion without overt pulmonary edema. Interstitial opacity at the bases suggests edema. Small right pleural effusion noted. Bones are diffusely demineralized. Patient is status post CABG. IMPRESSION: Cardiomegaly with vascular congestion and probable interstitial pulmonary edema. Small right pleural effusion. Electronically Signed   By: Misty Stanley M.D.   On: 07/23/2015 19:06    EKG: pending  Assessment/Plan 69 yo female with acute on chronic diastolic chf exacerbation  Principal Problem:   Acute on chronic diastolic congestive heart failure ([place])- minimize fluids to 1.2 liters per day.  Place on zaroxolyn 2.5 mg daily and place on lasix 51m iv q 12 hours.  chf pathway.  Cardiac echo in the am.  Cardiac consult also in the am requested through epic.  Trop was mildly elevated will repeat this now, but likely due to worsening renal function.  Check 12 lead ekg now.    Active Problems:   AKI (acute kidney injury) ([facility]- per cardiology noted her last cr was 1.9 a couple of weeks ago.  Now 1.78.  9 months ago was 1.  Monitor renal function closely while increasing her diuretics in house.  Hypertension, uncontrolled- noted, cont home meds   PAD (peripheral artery disease) ([place])- noted, on plavix   Carotid artery disease ([place])- cont cardiac meds   Hx of CABG 2012- noted   Diabetes mellitus type II, uncontrolled ([place])- cont home regimen   Recurrent pleural effusion on right- noted, still with mild right pleural effusion.  Per family report to ED earlier who are now gone, this has markedly helped her chronically with decreased hospitalizations.  Noted.   Admit to medical floor.   DVT prophylaxis:   SCDs and lovenox  Code Status:   DNR  Taino Maertens A MD [facility]  If 7PM-7AM, please contact night-coverage [email or web address] Password [facility]  07/23/2015,  9:31 PM

## 2015-07-23 NOTE — ED Notes (Signed)
Report given to Will on Dept 300, all questions answered

## 2015-07-24 ENCOUNTER — Inpatient Hospital Stay (HOSPITAL_COMMUNITY): Payer: Medicare Other

## 2015-07-24 DIAGNOSIS — R0600 Dyspnea, unspecified: Secondary | ICD-10-CM

## 2015-07-24 LAB — BASIC METABOLIC PANEL
Anion gap: 9 (ref 5–15)
BUN: 40 mg/dL — AB (ref 6–20)
CHLORIDE: 98 mmol/L — AB (ref 101–111)
CO2: 31 mmol/L (ref 22–32)
CREATININE: 1.39 mg/dL — AB (ref 0.44–1.00)
Calcium: 8.7 mg/dL — ABNORMAL LOW (ref 8.9–10.3)
GFR calc Af Amer: 44 mL/min — ABNORMAL LOW (ref >60)
GFR calc non Af Amer: 38 mL/min — ABNORMAL LOW (ref >60)
GLUCOSE: 112 mg/dL — AB (ref 65–99)
POTASSIUM: 3.9 mmol/L (ref 3.5–5.1)
SODIUM: 138 mmol/L (ref 135–145)

## 2015-07-24 LAB — GLUCOSE, CAPILLARY
GLUCOSE-CAPILLARY: 164 mg/dL — AB (ref 65–99)
GLUCOSE-CAPILLARY: 149 mg/dL — AB (ref 65–99)
Glucose-Capillary: 154 mg/dL — ABNORMAL HIGH (ref 65–99)

## 2015-07-24 LAB — ECHOCARDIOGRAM COMPLETE
Height: 66 in
WEIGHTICAEL: 3393.32 [oz_av]

## 2015-07-24 MED ORDER — MORPHINE SULFATE (PF) 2 MG/ML IV SOLN
2.0000 mg | Freq: Once | INTRAVENOUS | Status: AC
Start: 2015-07-24 — End: 2015-07-24
  Administered 2015-07-24: 2 mg via INTRAVENOUS
  Filled 2015-07-24: qty 1

## 2015-07-24 MED ORDER — OXYCODONE HCL 5 MG PO TABS
5.0000 mg | ORAL_TABLET | ORAL | Status: DC | PRN
  Administered 2015-07-24 (×2): 5 mg via ORAL
  Administered 2015-07-24 – 2015-07-29 (×23): 10 mg via ORAL
  Administered 2015-07-30: 5 mg via ORAL
  Administered 2015-07-30 (×2): 10 mg via ORAL
  Administered 2015-07-31: 5 mg via ORAL
  Administered 2015-07-31 – 2015-08-01 (×4): 10 mg via ORAL
  Filled 2015-07-24 (×2): qty 2
  Filled 2015-07-24: qty 1
  Filled 2015-07-24 (×18): qty 2
  Filled 2015-07-24: qty 1
  Filled 2015-07-24 (×12): qty 2

## 2015-07-24 MED ORDER — INSULIN ASPART 100 UNIT/ML ~~LOC~~ SOLN
0.0000 [IU] | Freq: Three times a day (TID) | SUBCUTANEOUS | Status: DC
  Administered 2015-07-24 – 2015-07-25 (×3): 3 [IU] via SUBCUTANEOUS
  Administered 2015-07-25: 2 [IU] via SUBCUTANEOUS
  Administered 2015-07-26 – 2015-07-27 (×2): 3 [IU] via SUBCUTANEOUS
  Administered 2015-07-27 – 2015-07-28 (×2): 2 [IU] via SUBCUTANEOUS
  Administered 2015-07-29: 3 [IU] via SUBCUTANEOUS
  Administered 2015-07-30 – 2015-07-31 (×3): 2 [IU] via SUBCUTANEOUS
  Administered 2015-08-01: 3 [IU] via SUBCUTANEOUS

## 2015-07-24 MED ORDER — METOLAZONE 5 MG PO TABS
2.5000 mg | ORAL_TABLET | Freq: Every day | ORAL | Status: DC
  Administered 2015-07-24 – 2015-07-26 (×3): 2.5 mg via ORAL
  Filled 2015-07-24 (×3): qty 1

## 2015-07-24 MED ORDER — INSULIN ASPART 100 UNIT/ML ~~LOC~~ SOLN
0.0000 [IU] | Freq: Every day | SUBCUTANEOUS | Status: DC
  Administered 2015-07-28: 3 [IU] via SUBCUTANEOUS

## 2015-07-24 NOTE — Progress Notes (Signed)
PROGRESS NOTE    Agata Lucente Langenbach  ERD:[id number] DOB: 1945/11/29 DOA: 07/23/2015 PCP: Glenda Chroman, MD Outpatient Specialists:  Cardiology; Dr Gwenlyn Found  Cardiology; Dr Fletcher Anon  Cardiothoracic surgery; Dr. Kerby Less  Vascular surgery; Dr. Oneida Alar   Brief Narrative:  32 yof with a hx of CHF, CRF on 2L home O2 at night, DM, HTn, CAD, presented with complaints of worsening edema in there legs and pannus with associated SOB. While in the ED, given lasix and admitted for acute decompensation of CHF.     Assessment & Plan:   Principal Problem:   Acute on chronic diastolic congestive heart failure (HCC) Active Problems:   Hypertension, uncontrolled   PAD (peripheral artery disease) (HCC)   Carotid artery disease (HCC)   Hx of CABG 2012   Diabetes mellitus type II, uncontrolled (HCC)   CHF (congestive heart failure) (HCC)   Recurrent pleural effusion on right   AKI (acute kidney injury) ([place])  1. Acute on chronic diastolic CHF. Patient already on a BB. Continue diuretics and monitor I&Os. Follow up echo results. She still has evidence of significant volume overload and will need continued diuresis.  2. AKI. Improving. Possibly related to low output. Will continue monitor closely in the setting of diuretics. 3. HTN. Continue outpatient regimen 4. PAD. On plavix 5. CAD. Continue outpatient regimen. 6. DM type 2. Continue outpatient regimen. 7. Hx of CABG 2012  DVT prophylaxis: Lovenox Code Status: DNR Family Communication: discussed with patient. No family present at bedside. Disposition Plan: Discharge home one improved, likely 1-2 days.   Consultants:   none  Procedures:   ECHO  Antimicrobials:  none    Subjective: Feels no significant improvement from yesterday with no notable improvement in LE edema. Is urinating well. Reports pain due to volume overload. Is having normal BM's. Reports pain in hips extending down legs and radiating up her back.   Objective: Filed Vitals:    07/23/15 2000 07/23/15 2030 07/23/15 2115 07/23/15 2219  BP: 154/55 159/56 171/49   Pulse: 47 48 55   Temp:   97.8 F (36.6 C)   TempSrc:   Oral   Resp:   20   Height:   _0  (1.676 m)   Weight:   96.7 kg (213 lb 3 oz) 96.2 kg (212 lb 1.3 oz)  SpO2: 93% 92% 91%     Intake/Output Summary (Last 24 hours) at 07/24/15 0753 Last data filed at 07/24/15 0100  Gross per 24 hour  Intake      0 ml  Output    750 ml  Net   -750 ml   Filed Weights   07/23/15 1607 07/23/15 2115 07/23/15 2219  Weight: 104.327 kg (230 lb) 96.7 kg (213 lb 3 oz) 96.2 kg (212 lb 1.3 oz)    Examination: General exam: Appears calm and comfortable  Respiratory system: Crackles at bases. PleurX cath in right chest. Cardiovascular system: S1 & S2 heard, RRR. No JVD, murmurs, rubs, gallops or clicks. 1+ pitting edema up to thighs. Gastrointestinal system: Abdomen is nondistended, soft and nontender. No organomegaly or masses felt. Normal bowel sounds heard. Central nervous system: Alert and oriented. No focal neurological deficits. Extremities: Symmetric 5 x 5 power. Skin: Venous stasis changes in lower extremities.  Psychiatry: Judgement and insight appear normal. Mood & affect appropriate.    Data Reviewed: I have personally reviewed following labs and imaging studies  CBC:  Recent Labs Lab 07/23/15 1629  WBC 7.4  NEUTROABS 5.6  HGB  10.3*  HCT 34.6*  MCV 89.9  PLT 794   Basic Metabolic Panel:  Recent Labs Lab 07/23/15 1629 07/24/15 0513  NA 134* 138  K 5.0 3.9  CL 99* 98*  CO2 30 31  GLUCOSE 231* 112*  BUN 46* 40*  CREATININE 1.78* 1.39*  CALCIUM 8.7* 8.7*   Cardiac Enzymes:  Recent Labs Lab 07/23/15 1629 07/23/15 2045  TROPONINI 0.03* 0.03*   Urine analysis:    Component Value Date/Time   COLORURINE YELLOW 10/10/2014 [place] 10/10/2014 2345   LABSPEC 1.010 10/10/2014 2345   PHURINE 6.0 10/10/2014 2345   GLUCOSEU NEGATIVE 10/10/2014 2345   HGBUR TRACE*  10/10/2014 2345   BILIRUBINUR NEGATIVE 10/10/2014 [place] 10/10/2014 2345   PROTEINUR 30* 10/10/2014 2345   UROBILINOGEN 0.2 10/10/2014 2345   NITRITE NEGATIVE 10/10/2014 2345   LEUKOCYTESUR NEGATIVE 10/10/2014 2345   Sepsis Labs: _0 (procalcitonin:4,lacticidven:4)  )No results found for this or any previous visit (from the past 240 hour(s)).   Radiology Studies: Dg Chest 2 View  07/23/2015  CLINICAL DATA:  Shortness of breath and CHF. EXAM: CHEST  2 VIEW COMPARISON:  10/16/2014 FINDINGS: AP and lateral views of the chest were obtained. The cardio pericardial silhouette is enlarged. There is pulmonary vascular congestion without overt pulmonary edema. Interstitial opacity at the bases suggests edema. Small right pleural effusion noted. Bones are diffusely demineralized. Patient is status post CABG. IMPRESSION: Cardiomegaly with vascular congestion and probable interstitial pulmonary edema. Small right pleural effusion. Electronically Signed   By: Misty Stanley M.D.   On: 07/23/2015 19:06    Scheduled Meds: . amLODipine  10 mg Oral Daily  . aspirin EC  81 mg Oral Daily  . atorvastatin  40 mg Oral q1800  . carvedilol  3.125 mg Oral BID WC  . citalopram  40 mg Oral Daily  . enoxaparin (LOVENOX) injection  40 mg Subcutaneous Q24H  . furosemide  60 mg Intravenous Q12H  . glimepiride  2 mg Oral BID AC  . hydrALAZINE  25 mg Oral Q8H  . insulin aspart  3 Units Subcutaneous TID WC  . insulin detemir  7 Units Subcutaneous QHS  . isosorbide mononitrate  30 mg Oral QPM  . lisinopril  10 mg Oral Daily  . multivitamin with minerals  1 tablet Oral Daily  . sodium chloride flush  3 mL Intravenous Q12H   Continuous Infusions:    LOS: 1 day    Time spent: 25 minutes  Kathie Dike, MD [facility] Pager [phone]  If 7PM-7AM, please contact night-coverage [email or web address] Password [email or web address] 07/24/2015, 7:53 AM   By signing my name below, I, Delene Ruffini,  attest that this documentation has been prepared under the direction and in the presence of Kathie Dike, MD. Electronically Signed: Delene Ruffini 07/24/2015 12:21pm  I, Dr. Kathie Dike, personally performed the services described in this documentaiton. All medical record entries made by the scribe were at my direction and in my presence. I have reviewed the chart and agree that the record reflects my personal performance and is accurate and complete  Kathie Dike, MD, 07/24/2015 12:39 PM

## 2015-07-24 NOTE — Progress Notes (Signed)
  Echocardiogram 2D Echocardiogram has been performed.  [name] [name] 07/24/2015, 8:07 AM

## 2015-07-24 NOTE — Progress Notes (Signed)
Patients pain not improved after pain pill given.  MD notified, orders for one time dose morphine.

## 2015-07-25 LAB — BASIC METABOLIC PANEL
Anion gap: 8 (ref 5–15)
BUN: 44 mg/dL — AB (ref 6–20)
CALCIUM: 9 mg/dL (ref 8.9–10.3)
CHLORIDE: 96 mmol/L — AB (ref 101–111)
CO2: 35 mmol/L — AB (ref 22–32)
CREATININE: 1.56 mg/dL — AB (ref 0.44–1.00)
GFR calc non Af Amer: 33 mL/min — ABNORMAL LOW (ref >60)
GFR, EST AFRICAN AMERICAN: 38 mL/min — AB (ref >60)
Glucose, Bld: 76 mg/dL (ref 65–99)
Potassium: 4.2 mmol/L (ref 3.5–5.1)
Sodium: 139 mmol/L (ref 135–145)

## 2015-07-25 LAB — GLUCOSE, CAPILLARY
GLUCOSE-CAPILLARY: 155 mg/dL — AB (ref 65–99)
Glucose-Capillary: 75 mg/dL (ref 65–99)
Glucose-Capillary: 129 mg/dL — ABNORMAL HIGH (ref 65–99)
Glucose-Capillary: 143 mg/dL — ABNORMAL HIGH (ref 65–99)

## 2015-07-25 NOTE — Progress Notes (Signed)
Discussed patient BP and HR with MD this am.  Verbal orders given to hold lisinopril and coreg.

## 2015-07-25 NOTE — Progress Notes (Signed)
PROGRESS NOTE    [name] [name]  RUE:[id number] DOB: 10-01-1945 DOA: 07/23/2015 PCP: Glenda Chroman, MD Outpatient Specialists:  Cardiology; Dr Gwenlyn Found  Cardiology; Dr Fletcher Anon  Cardiothoracic surgery; Dr. Kerby Less  Vascular surgery; Dr. Oneida Alar   Brief Narrative:  81 yof with a hx of CHF, CRF on 2L home O2 at night, DM, HTn, CAD, presented with complaints of worsening edema in there legs and pannus with associated SOB. While in the ED, given lasix and admitted for acute decompensation of CHF. She was also noted initially to have an AKI due to low volume output. This was improved with greater volume output.    Assessment & Plan:   Principal Problem:   Acute on chronic diastolic congestive heart failure (HCC) Active Problems:   Hypertension, uncontrolled   PAD (peripheral artery disease) (HCC)   Carotid artery disease (HCC)   Hx of CABG 2012   Diabetes mellitus type II, uncontrolled (HCC)   CHF (congestive heart failure) (HCC)   Recurrent pleural effusion on right   AKI (acute kidney injury) ([place])  1. Acute on chronic diastolic CHF. Patient already on a BB. Continue diuretics and monitor I&Os. ECHO shows normal systolic function with EF of 55-60% with results as below. She still has evidence of significant volume overload and will need continued diuresis.  2. AKI. Trending up. Will likely need to tolerate a higher creatinine to achieve adequate diuresis. Will continue monitor closely in the setting of diuretics. 3. Chronic bilateral hip pain. Pain meds PRN. 4. HTN. Continue outpatient regimen 5. PAD. On plavix 6. CAD. Continue outpatient regimen. 7. DM type 2. Continue outpatient regimen. 8. Hx of CABG 2012  DVT prophylaxis: Lovenox Code Status: DNR Family Communication: discussed with patient. No family present at bedside. Disposition Plan: Discharge home one improved, likely 1-2 days.   Consultants:   none  Procedures:   ECHO Study Conclusions  - Left ventricle: The  cavity size was normal. There was moderate  concentric hypertrophy. Systolic function was normal. The  estimated ejection fraction was in the range of 55% to 60%. Wall  motion was normal; there were no regional wall motion  abnormalities. Features are consistent with a pseudonormal left  ventricular filling pattern, with concomitant abnormal relaxation  and increased filling pressure (grade 2 diastolic dysfunction).  Doppler parameters are consistent with elevated ventricular  end-diastolic filling pressure. - Ventricular septum: The contour showed diastolic flattening and  systolic flattening. - Aortic valve: Trileaflet; normal thickness leaflets. There was  trivial regurgitation. - Aortic root: The aortic root was normal in size. - Ascending aorta: The ascending aorta was normal in size. - Mitral valve: Structurally normal valve. There was mild  regurgitation. - Left atrium: The atrium was moderately dilated. - Right ventricle: Systolic function was normal. - Right atrium: The atrium was normal in size. - Tricuspid valve: There was mild regurgitation. - Pulmonic valve: There was trivial regurgitation. - Pulmonary arteries: Systolic pressure was moderately increased.  PA peak pressure: 52 mm Hg (S). - Inferior vena cava: The vessel was normal in size. - Pericardium, extracardiac: There was no pericardial effusion.  Antimicrobials:  none    Subjective: Pt is feeling much better today. Breathing is better. Her edema in her legs has improved but she still has pain today.  Pt denies coughing. She is urinating well.   Objective: Filed Vitals:   07/24/15 1346 07/24/15 2059 07/24/15 2116 07/25/15 0651  BP: 164/58 141/38 129/44 145/41  Pulse: 56 53  55  Temp: 98.1 F (36.7 C) 98.3 F (36.8 C)  98.2 F (36.8 C)  TempSrc: Oral Oral  Oral  Resp: _0 Height:      Weight:    96 kg (211 lb 10.3 oz)  SpO2: 96% 93%  90%    Intake/Output Summary (Last 24 hours)  at 07/25/15 0736 Last data filed at 07/25/15 0001  Gross per 24 hour  Intake    480 ml  Output   1750 ml  Net  -1270 ml   Filed Weights   07/23/15 2115 07/23/15 2219 07/25/15 0651  Weight: 96.7 kg (213 lb 3 oz) 96.2 kg (212 lb 1.3 oz) 96 kg (211 lb 10.3 oz)   Examination:  General exam: Appears calm and comfortable  Respiratory system: Bilateral crackles and wheeze Cardiovascular system: S1 & S2 heard, RRR. No JVD, murmurs, rubs, gallops or clicks. Trace to 1+ pedal edema. Gastrointestinal system: Abdomen is nondistended, soft and nontender. No organomegaly or masses felt. Normal bowel sounds heard. Central nervous system: Alert and oriented. No focal neurological deficits. Extremities: Symmetric 5 x 5 power. Skin: venous stasis changes  Psychiatry: Judgement and insight appear normal. Mood & affect appropriate.   Data Reviewed: I have personally reviewed following labs and imaging studies  CBC:  Recent Labs Lab 07/23/15 1629  WBC 7.4  NEUTROABS 5.6  HGB 10.3*  HCT 34.6*  MCV 89.9  PLT 444   Basic Metabolic Panel:  Recent Labs Lab 07/23/15 1629 07/24/15 0513  NA 134* 138  K 5.0 3.9  CL 99* 98*  CO2 30 31  GLUCOSE 231* 112*  BUN 46* 40*  CREATININE 1.78* 1.39*  CALCIUM 8.7* 8.7*   Cardiac Enzymes:  Recent Labs Lab 07/23/15 1629 07/23/15 2045  TROPONINI 0.03* 0.03*   Urine analysis:    Component Value Date/Time   COLORURINE YELLOW 10/10/2014 [place] 10/10/2014 2345   LABSPEC 1.010 10/10/2014 2345   PHURINE 6.0 10/10/2014 2345   GLUCOSEU NEGATIVE 10/10/2014 2345   HGBUR TRACE* 10/10/2014 2345   BILIRUBINUR NEGATIVE 10/10/2014 2345   KETONESUR NEGATIVE 10/10/2014 2345   PROTEINUR 30* 10/10/2014 2345   UROBILINOGEN 0.2 10/10/2014 2345   NITRITE NEGATIVE 10/10/2014 2345   LEUKOCYTESUR NEGATIVE 10/10/2014 2345   Sepsis Labs: _1 (procalcitonin:4,lacticidven:4)  )No results found for this or any previous visit (from the past  240 hour(s)).   Radiology Studies: Dg Chest 2 View  07/23/2015  CLINICAL DATA:  Shortness of breath and CHF. EXAM: CHEST  2 VIEW COMPARISON:  10/16/2014 FINDINGS: AP and lateral views of the chest were obtained. The cardio pericardial silhouette is enlarged. There is pulmonary vascular congestion without overt pulmonary edema. Interstitial opacity at the bases suggests edema. Small right pleural effusion noted. Bones are diffusely demineralized. Patient is status post CABG. IMPRESSION: Cardiomegaly with vascular congestion and probable interstitial pulmonary edema. Small right pleural effusion. Electronically Signed   By: Misty Stanley M.D.   On: 07/23/2015 19:06    Scheduled Meds: . amLODipine  10 mg Oral Daily  . aspirin EC  81 mg Oral Daily  . atorvastatin  40 mg Oral q1800  . carvedilol  3.125 mg Oral BID WC  . citalopram  40 mg Oral Daily  . enoxaparin (LOVENOX) injection  40 mg Subcutaneous Q24H  . furosemide  60 mg Intravenous Q12H  . glimepiride  2 mg Oral BID AC  . hydrALAZINE  25 mg Oral Q8H  . insulin aspart  0-15 Units Subcutaneous TID  WC  . insulin aspart  0-5 Units Subcutaneous QHS  . insulin aspart  3 Units Subcutaneous TID WC  . insulin detemir  7 Units Subcutaneous QHS  . isosorbide mononitrate  30 mg Oral QPM  . lisinopril  10 mg Oral Daily  . metolazone  2.5 mg Oral Daily  . multivitamin with minerals  1 tablet Oral Daily  . sodium chloride flush  3 mL Intravenous Q12H   Continuous Infusions:    LOS: 2 days    Time spent: 25 minutes  Kathie Dike, MD [facility] Pager [phone]  If 7PM-7AM, please contact night-coverage [email or web address] Password [email or web address] 07/25/2015, 7:36 AM   By signing my name below, I, Delene Ruffini, attest that this documentation has been prepared under the direction and in the presence of Kathie Dike, MD. Electronically Signed: Delene Ruffini 07/25/2015 1:10pm  I, Dr. Kathie Dike, personally performed the services  described in this documentaiton. All medical record entries made by the scribe were at my direction and in my presence. I have reviewed the chart and agree that the record reflects my personal performance and is accurate and complete  Kathie Dike, MD, 07/25/2015 1:22 PM

## 2015-07-25 NOTE — Progress Notes (Signed)
Approximately 450cc of cloudy yellow/brown fluid drained from PleurX,.  Site appears WNL, dressing replaced.  Patient to be drained again on Wednesday.

## 2015-07-26 DIAGNOSIS — R001 Bradycardia, unspecified: Secondary | ICD-10-CM

## 2015-07-26 DIAGNOSIS — I5033 Acute on chronic diastolic (congestive) heart failure: Secondary | ICD-10-CM

## 2015-07-26 LAB — GLUCOSE, CAPILLARY
GLUCOSE-CAPILLARY: 84 mg/dL (ref 65–99)
GLUCOSE-CAPILLARY: 119 mg/dL — AB (ref 65–99)
GLUCOSE-CAPILLARY: 181 mg/dL — AB (ref 65–99)
GLUCOSE-CAPILLARY: 134 mg/dL — AB (ref 65–99)

## 2015-07-26 LAB — BASIC METABOLIC PANEL
ANION GAP: 9 (ref 5–15)
BUN: 47 mg/dL — ABNORMAL HIGH (ref 6–20)
CALCIUM: 9.1 mg/dL (ref 8.9–10.3)
CO2: 34 mmol/L — ABNORMAL HIGH (ref 22–32)
Chloride: 95 mmol/L — ABNORMAL LOW (ref 101–111)
Creatinine, Ser: 1.63 mg/dL — ABNORMAL HIGH (ref 0.44–1.00)
GFR, EST AFRICAN AMERICAN: 36 mL/min — AB (ref >60)
GFR, EST NON AFRICAN AMERICAN: 31 mL/min — AB (ref >60)
Glucose, Bld: 96 mg/dL (ref 65–99)
Potassium: 4.2 mmol/L (ref 3.5–5.1)
SODIUM: 138 mmol/L (ref 135–145)

## 2015-07-26 MED ORDER — GABAPENTIN 300 MG PO CAPS
300.0000 mg | ORAL_CAPSULE | Freq: Every day | ORAL | Status: DC
  Administered 2015-07-27 – 2015-08-01 (×6): 300 mg via ORAL
  Filled 2015-07-26 (×6): qty 1

## 2015-07-26 MED ORDER — GABAPENTIN 300 MG PO CAPS
600.0000 mg | ORAL_CAPSULE | Freq: Every day | ORAL | Status: DC
  Administered 2015-07-26 – 2015-07-31 (×6): 600 mg via ORAL
  Filled 2015-07-26 (×6): qty 2

## 2015-07-26 MED ORDER — GABAPENTIN 300 MG PO CAPS: 300.0000 mg | ORAL_CAPSULE | Freq: Two times a day (BID) | ORAL | Status: DC

## 2015-07-26 MED ORDER — MAGNESIUM HYDROXIDE 400 MG/5ML PO SUSP: 30.0000 mL | Freq: Every day | ORAL | Status: DC | PRN

## 2015-07-26 NOTE — Consult Note (Signed)
Primary cardiologist: Dr Rozann Lesches Consulting cardiologist: Dr Carlyle Dolly Requesting Physician: Dr Jay Schlichter Indication: SOB, edema  Clinical Summary Ms. Cotta is a 70 y.o.female with history of chronic diastolic HF, HTN, HL, carotis stenosis, PAD, CAD with previous CABG, DM2, prior CVA, chronic pleurx catheter admitted with SOB and LE edema.   From outpatient notes diuresis has been complicated by renal insufficiency. Torsemide had been decreased to 14m daily, with subsequent significant fluid gain.  Torsemide at last cardiology visit was increased to 46mbid. She reports recent increased SOB, abdominal distension, and LE edema over the last several days.   Trop 0.03, BNP 654, K 5, Cr 1.78 (baseline around 1.1 9 months ago, 1.18 June 2015), Hgb 10.3, Plt 186  CXR pulmonary edema 07/2015 echo: LVEF 55-60%, grade II diastolic dysfunction, PASP 52, flattened ventricular septum suggesting RV pressure and volume overload, normal RV function  Allergies  Allergen Reactions  . Other Shortness Of Breath, Rash and Other (See Comments)    All berries   . Strawberry Extract Hives, Shortness Of Breath and Rash  . Sulfa Antibiotics Swelling    Bodily Swelling  . Tape Other (See Comments)    Tears skin.  Please use "paper" tape only.    Medications Scheduled Medications: . amLODipine  10 mg Oral Daily  . aspirin EC  81 mg Oral Daily  . atorvastatin  40 mg Oral q1800  . carvedilol  3.125 mg Oral BID WC  . citalopram  40 mg Oral Daily  . enoxaparin (LOVENOX) injection  40 mg Subcutaneous Q24H  . furosemide  60 mg Intravenous Q12H  . glimepiride  2 mg Oral BID AC  . hydrALAZINE  25 mg Oral Q8H  . insulin aspart  0-15 Units Subcutaneous TID WC  . insulin aspart  0-5 Units Subcutaneous QHS  . insulin aspart  3 Units Subcutaneous TID WC  . insulin detemir  7 Units Subcutaneous QHS  . isosorbide mononitrate  30 mg Oral QPM  . lisinopril  10 mg Oral Daily  . metolazone  2.5  mg Oral Daily  . multivitamin with minerals  1 tablet Oral Daily  . sodium chloride flush  3 mL Intravenous Q12H     Infusions:     PRN Medications:  sodium chloride, acetaminophen, acetaminophen, albuterol, nitroGLYCERIN, ondansetron (ZOFRAN) IV, oxyCODONE, sodium chloride flush   Past Medical History  Diagnosis Date  . Mixed hyperlipidemia   . Essential hypertension, benign   . Depression   . Cellulitis     a. Recurrent, bilateral legs  . Carotid artery disease (HCC)     a. Bilateral CEA; 50-60% bilateral ICA stenosis, 11/12  . P[place] artery disease) ([facility] [place]  . [place] ([facility]  . TIA (transient ischemic attack)   . TMJ syndrome   . Herniated disc   . Ischemic cardiomyopathy     a. 11/2010 TEE: EF 40-45%.  . Critical lower limb ischemia   . Venous insufficiency   . Coronary atherosclerosis of native coronary artery     a. 11/2010 NSTEMI/CABG x 4: L-LAD; S-PL; S-OM; S-DX, by PVT.  . Marland KitchenSTEMI (non-ST elevated myocardial infarction) ([facility]11/2012  . Pneumonia 2000's X 2  . Type 2 diabetes mellitus ([facility]  . History of blood transfusion   . Migraine   . Stroke ([facility]8/2011  . Arthritis   . Chronic back pain   . On home oxygen therapy   . Chronic diastolic heart failure ([facility]  .  CHF (congestive heart failure) [facility])     Past Surgical History  Procedure Laterality Date  . Appendectomy    . Cholecystectomy    . Abdominal hysterectomy    . Carotid endarterectomy Bilateral 2011    Bilateral - Dr. Kellie Simmering  . Wrist fracture surgery Left     "grafted bone from hip to wrist"  . Coronary artery bypass graft  11/24/2010    Procedure: CORONARY ARTERY BYPASS GRAFTING (CABG);  Surgeon: Tharon Aquas Adelene Idler, MD;  Location: [place];  Service: Open Heart Surgery;  Laterality: N/A;  Coronary Artery Bypass Graft times four on pump utilizing left internal mammary artery and bilateral greater saphenous veins harvested endoscopically, transesophageal echocardiogram     . Toe surgery Left     "put pin in 2nd toe"  . Lower extremity angiogram  09/08/2012; 10/06/2013    "found 100% blockage; unsuccessful attempt at crossing a chronic total occlusion of the left SFA in the setting of critical limb ischemia  . Dilation and curettage of uterus    . Tubal ligation    . Debridement toe Left     "nonhealing wound; 3rd digit  . Cardiac catheterization  11/2010  . Coronary angioplasty with stent placement  03/2013    "1"  . Colonoscopy w/ biopsies and polypectomy    . Fracture surgery    . Femoral-popliteal bypass graft Left 10/07/2013    Procedure: LEFT FEMORAL-POPLITEAL ARTERY BYPASS GRAFT;  Surgeon: Angelia Mould, MD;  Location: [place];  Service: Vascular;  Laterality: Left;  . Intraoperative arteriogram Left 10/07/2013    Procedure: INTRA OPERATIVE ARTERIOGRAM LEFT LEG;  Surgeon: Angelia Mould, MD;  Location: [place];  Service: Vascular;  Laterality: Left;  . Endarterectomy popliteal Left 10/07/2013    Procedure: LEFT POPLITEAL ENDARTERECTOMY ;  Surgeon: Angelia Mould, MD;  Location: [place];  Service: Vascular;  Laterality: Left;  . Left and right heart catheterization with coronary/graft angiogram N/A 03/27/2013    Procedure: LEFT AND RIGHT HEART CATHETERIZATION WITH Beatrix Fetters;  Surgeon: Burnell Blanks, MD;  Location: [facility] CATH LAB;  Service: Cardiovascular;  Laterality: N/A;  . Lower extremity angiogram Bilateral 09/08/2013    Procedure: LOWER EXTREMITY ANGIOGRAM;  Surgeon: [name], MD;  Location: [facility] C[facility] LAB;  Service: Cardiovascular;  Laterality: Bilateral;    Family History  Problem Relation Age of Onset  . Coronary artery disease Father     Died with MI age 66  . Heart disease Father     before age 48  . Heart attack Father   . Diabetes type II Mother   . Hypertension Mother   . Diabetes Mother   . Heart disease Mother   . Hyperlipidemia Mother   . Heart failure Sister   . Cancer Sister   . Cancer  Brother     Lung cancer  . Diabetes Daughter   . Hyperlipidemia Daughter   . Diabetes Son   . Hyperlipidemia Son     Social History Ms. Oldenburg reports that she quit smoking about 5 years ago. Her smoking use included Cigarettes. She started smoking about 59 years ago. She has a 150 pack-year smoking history. She has never used smokeless tobacco. Ms. Peavler reports that she drinks alcohol.  Review of Systems CONSTITUTIONAL: No weight loss, fever, chills, weakness or fatigue.  HEENT: Eyes: No visual loss, blurred vision, double vision or yellow sclerae. No hearing loss, sneezing, congestion, runny nose or sore throat.  SKIN: No rash or  itching.  CARDIOVASCULAR: per HPI RESPIRATORY:per HPI GASTROINTESTINAL: No anorexia, nausea, vomiting or diarrhea. No abdominal pain or blood.  GENITOURINARY: no polyuria, no dysuria NEUROLOGICAL: No headache, dizziness, syncope, paralysis, ataxia, numbness or tingling in the extremities. No change in bowel or bladder control.  MUSCULOSKELETAL: No muscle, back pain, joint pain or stiffness.  HEMATOLOGIC: No anemia, bleeding or bruising.  LYMPHATICS: No enlarged nodes. No history of splenectomy.  PSYCHIATRIC: No history of depression or anxiety.      Physical Examination Blood pressure 155/48, pulse 59, temperature 98.2 F (36.8 C), temperature source Oral, resp. rate 20, height 5' 6" (1.676 m), weight 208 lb 8.9 oz (94.6 kg), SpO2 96 %.  Intake/Output Summary (Last 24 hours) at 07/26/15 0906 Last data filed at 07/26/15 0803  Gross per 24 hour  Intake    480 ml  Output   2050 ml  Net  -1570 ml    HEENT: sclera clear, throat clear  Cardiovascular: RRR, no m/r/g, no jvd  Respiratory: mild crackles bilateral bases  GI: abdomen soft, NT, ND  MSK: no LE edema  Neuro: no focal deficits  Psych: appropriate affect   Lab Results  Basic Metabolic Panel:  Recent Labs Lab 07/23/15 1629 07/24/15 0513 07/25/15 0624 07/26/15 0627  NA 134*  138 139 138  K 5.0 3.9 4.2 4.2  CL 99* 98* 96* 95*  CO2 30 31 35* 34*  GLUCOSE 231* 112* 76 96  BUN 46* 40* 44* 47*  CREATININE 1.78* 1.39* 1.56* 1.63*  CALCIUM 8.7* 8.7* 9.0 9.1    Liver Function Tests: No results for input(s): AST, ALT, ALKPHOS, BILITOT, PROT, ALBUMIN in the last 168 hours.  CBC:  Recent Labs Lab 07/23/15 1629  WBC 7.4  NEUTROABS 5.6  HGB 10.3*  HCT 34.6*  MCV 89.9  PLT 186    Cardiac Enzymes:  Recent Labs Lab 07/23/15 1629 07/23/15 2045  TROPONINI 0.03* 0.03*    BNP: Invalid input(s): POCBNP      Impression/Recommendations 1. Acute on chronic diastolic HF - echo this admit with normal LVEF, grade II diastolic dysfunction - I/Os are incomplete, looks to be at least negative 3.3 liters. Incontinent and wears diapers, unable to accurately measure output. Initial down trend in Cr with diuresis, now slowly trending up. She is on lasix 44m IV bid. Weight down from 213 to 208 lbs. Continued symptms, would continue IV diuretics today.   2. Sinus bradycardia - rates down in 40s at times, we will d/c coreg and monitor   JCarlyle Dolly M.D.

## 2015-07-26 NOTE — Progress Notes (Signed)
PROGRESS NOTE    [name] [name]  [id number]:[id number] DOB: Jul 29, 1945 DOA: 07/23/2015 PCP: Glenda Chroman, MD Outpatient Specialists:  Cardiology; Dr Gwenlyn Found  Cardiology; Dr Fletcher Anon  Cardiothoracic surgery; Dr. Kerby Less  Vascular surgery; Dr. Oneida Alar   Brief Narrative:  23 yof with a hx of CHF, CRF on 2L home O2 at night, DM, HTn, CAD, presented with complaints of worsening edema in there legs and pannus with associated SOB. While in the ED, given lasix and admitted for acute decompensation of CHF. She was also noted initially to have an AKI. She is currently being diuresed with intravenous lasix. Cardiology following.   Assessment & Plan:   Principal Problem:   Acute on chronic diastolic congestive heart failure (HCC) Active Problems:   Hypertension, uncontrolled   PAD (peripheral artery disease) (HCC)   Carotid artery disease (HCC)   Hx of CABG 2012   Diabetes mellitus type II, uncontrolled (HCC)   CHF (congestive heart failure) (HCC)   Recurrent pleural effusion on right   AKI (acute kidney injury) ([place])  1. Acute on chronic diastolic CHF. Beta blockers held due to bradycardia. ECHO shows normal systolic function with EF of 55-60% with results as below. She still has evidence of significant volume overload and is more short of breath today. Will need continued diuresis with IV lasix. Appreciate cardiology assistance 2. AKI. Creatinine trending up. Will discontinue metolazone. Will likely need to tolerate a higher creatinine to achieve adequate diuresis. Will continue to monitor closely in the setting of diuretics. 3. Chronic bilateral hip pain. Pain meds PRN. 4. HTN. Continue outpatient regimen 5. PAD. On plavix 6. CAD. Continue outpatient regimen. 7. DM type 2. Blood sugars stable. Continue outpatient regimen. 8. Hx of CABG 2012  DVT prophylaxis: Lovenox Code Status: DNR Family Communication: discussed with patient. No family present at bedside. Disposition Plan: Discharge home one  improved, likely 1-2 days.   Consultants:   cardiology  Procedures:   ECHO Study Conclusions  - Left ventricle: The cavity size was normal. There was moderate  concentric hypertrophy. Systolic function was normal. The  estimated ejection fraction was in the range of 55% to 60%. Wall  motion was normal; there were no regional wall motion  abnormalities. Features are consistent with a pseudonormal left  ventricular filling pattern, with concomitant abnormal relaxation  and increased filling pressure (grade 2 diastolic dysfunction).  Doppler parameters are consistent with elevated ventricular  end-diastolic filling pressure. - Ventricular septum: The contour showed diastolic flattening and  systolic flattening. - Aortic valve: Trileaflet; normal thickness leaflets. There was  trivial regurgitation. - Aortic root: The aortic root was normal in size. - Ascending aorta: The ascending aorta was normal in size. - Mitral valve: Structurally normal valve. There was mild  regurgitation. - Left atrium: The atrium was moderately dilated. - Right ventricle: Systolic function was normal. - Right atrium: The atrium was normal in size. - Tricuspid valve: There was mild regurgitation. - Pulmonic valve: There was trivial regurgitation. - Pulmonary arteries: Systolic pressure was moderately increased.  PA peak pressure: 52 mm Hg (S). - Inferior vena cava: The vessel was normal in size. - Pericardium, extracardiac: There was no pericardial effusion.  Antimicrobials:  none    Subjective: More short of breath today. Had increasing oxygen requirement overnight  Objective: Filed Vitals:   07/25/15 0952 07/25/15 1459 07/25/15 2145 07/26/15 0542  BP:  148/43 148/47 155/48  Pulse:  53 61 59  Temp:  98.7 F (37.1 C) 97.8 F (36.6  C) 98.2 F (36.8 C)  TempSrc:  Oral Oral Oral  Resp:  _0 Height:      Weight:    94.6 kg (208 lb 8.9 oz)  SpO2: 93% 95% 99% 96%     Intake/Output Summary (Last 24 hours) at 07/26/15 0704 Last data filed at 07/26/15 0401  Gross per 24 hour  Intake    720 ml  Output   1500 ml  Net   -780 ml   Filed Weights   07/23/15 2219 07/25/15 0651 07/26/15 0542  Weight: 96.2 kg (212 lb 1.3 oz) 96 kg (211 lb 10.3 oz) 94.6 kg (208 lb 8.9 oz)   Examination:  General exam: Appears calm and comfortable  Respiratory system: Crackles at bases. Respiratory effort normal. Cardiovascular system: S1 & S2 heard, RRR. No JVD, murmurs, rubs, gallops or clicks. 1+ pedal edema. Gastrointestinal system: Abdomen is nondistended, soft and nontender. No organomegaly or masses felt. Normal bowel sounds heard. Central nervous system: Alert and oriented. No focal neurological deficits. Extremities: Symmetric 5 x 5 power. Skin: No rashes, lesions or ulcers Psychiatry: Judgement and insight appear normal. Mood & affect appropriate.   Data Reviewed: I have personally reviewed following labs and imaging studies  CBC:  Recent Labs Lab 07/23/15 1629  WBC 7.4  NEUTROABS 5.6  HGB 10.3*  HCT 34.6*  MCV 89.9  PLT 595   Basic Metabolic Panel:  Recent Labs Lab 07/23/15 1629 07/24/15 0513 07/25/15 0624  NA 134* 138 139  K 5.0 3.9 4.2  CL 99* 98* 96*  CO2 30 31 35*  GLUCOSE 231* 112* 76  BUN 46* 40* 44*  CREATININE 1.78* 1.39* 1.56*  CALCIUM 8.7* 8.7* 9.0   Cardiac Enzymes:  Recent Labs Lab 07/23/15 1629 07/23/15 2045  TROPONINI 0.03* 0.03*   Urine analysis:    Component Value Date/Time   COLORURINE YELLOW 10/10/2014 [place] 10/10/2014 2345   LABSPEC 1.010 10/10/2014 2345   PHURINE 6.0 10/10/2014 2345   GLUCOSEU NEGATIVE 10/10/2014 2345   HGBUR TRACE* 10/10/2014 2345   BILIRUBINUR NEGATIVE 10/10/2014 [place] 10/10/2014 2345   PROTEINUR 30* 10/10/2014 2345   UROBILINOGEN 0.2 10/10/2014 2345   NITRITE NEGATIVE 10/10/2014 2345   LEUKOCYTESUR NEGATIVE 10/10/2014 2345   Sepsis  Labs: _1 (procalcitonin:4,lacticidven:4)  )No results found for this or any previous visit (from the past 240 hour(s)).   Radiology Studies: No results found.  Scheduled Meds: . amLODipine  10 mg Oral Daily  . aspirin EC  81 mg Oral Daily  . atorvastatin  40 mg Oral q1800  . carvedilol  3.125 mg Oral BID WC  . citalopram  40 mg Oral Daily  . enoxaparin (LOVENOX) injection  40 mg Subcutaneous Q24H  . furosemide  60 mg Intravenous Q12H  . glimepiride  2 mg Oral BID AC  . hydrALAZINE  25 mg Oral Q8H  . insulin aspart  0-15 Units Subcutaneous TID WC  . insulin aspart  0-5 Units Subcutaneous QHS  . insulin aspart  3 Units Subcutaneous TID WC  . insulin detemir  7 Units Subcutaneous QHS  . isosorbide mononitrate  30 mg Oral QPM  . lisinopril  10 mg Oral Daily  . metolazone  2.5 mg Oral Daily  . multivitamin with minerals  1 tablet Oral Daily  . sodium chloride flush  3 mL Intravenous Q12H   Continuous Infusions:    LOS: 3 days    Time spent: 25 minutes  [employer]  Roderic Palau, MD [facility] Pager [phone]  If 7PM-7AM, please contact night-coverage [email or web address] Password [email or web address] 07/26/2015, 7:04 AM   By signing my name below, I, Delene Ruffini, attest that this documentation has been prepared under the direction and in the presence of Kathie Dike, MD. Electronically Signed: Delene Ruffini 07/26/2015  I, Dr. Kathie Dike, personally performed the services described in this documentaiton. All medical record entries made by the scribe were at my direction and in my presence. I have reviewed the chart and agree that the record reflects my personal performance and is accurate and complete  Kathie Dike, MD, 07/26/2015 7:12 PM

## 2015-07-26 NOTE — Care Management Important Message (Signed)
Important Message  Patient Details  Name: [name] [name] MRN: [id number] Date of Birth: 08/04/1945   Medicare Important Message Given:  Yes    [name], RN 07/26/2015, 12:32 PM

## 2015-07-27 DIAGNOSIS — N184 Chronic kidney disease, stage 4 (severe): Secondary | ICD-10-CM | POA: Diagnosis present

## 2015-07-27 DIAGNOSIS — N183 Chronic kidney disease, stage 3 (moderate): Secondary | ICD-10-CM

## 2015-07-27 LAB — BASIC METABOLIC PANEL
Anion gap: 9 (ref 5–15)
BUN: 45 mg/dL — ABNORMAL HIGH (ref 6–20)
CHLORIDE: 94 mmol/L — AB (ref 101–111)
CO2: 36 mmol/L — ABNORMAL HIGH (ref 22–32)
CREATININE: 1.4 mg/dL — AB (ref 0.44–1.00)
Calcium: 9.3 mg/dL (ref 8.9–10.3)
GFR calc Af Amer: 44 mL/min — ABNORMAL LOW (ref >60)
GFR, EST NON AFRICAN AMERICAN: 38 mL/min — AB (ref >60)
GLUCOSE: 62 mg/dL — AB (ref 65–99)
POTASSIUM: 4 mmol/L (ref 3.5–5.1)
SODIUM: 139 mmol/L (ref 135–145)

## 2015-07-27 LAB — GLUCOSE, CAPILLARY
GLUCOSE-CAPILLARY: 60 mg/dL — AB (ref 65–99)
GLUCOSE-CAPILLARY: 123 mg/dL — AB (ref 65–99)
Glucose-Capillary: 122 mg/dL — ABNORMAL HIGH (ref 65–99)
Glucose-Capillary: 199 mg/dL — ABNORMAL HIGH (ref 65–99)
Glucose-Capillary: 187 mg/dL — ABNORMAL HIGH (ref 65–99)

## 2015-07-27 NOTE — Progress Notes (Signed)
PROGRESS NOTE    [name] [name]  TWS:[id number] DOB: 08-10-1945 DOA: 07/23/2015 PCP: Glenda Chroman, MD Outpatient Specialists:  Cardiology; Dr Gwenlyn Found  Cardiology; Dr Fletcher Anon  Cardiothoracic surgery; Dr. Kerby Less  Vascular surgery; Dr. Oneida Alar   Brief Narrative:  25 yof with a hx of CHF, Chronic resp failure on 2L home O2 at night, DM, HTn, CAD, presented with complaints of worsening edema in her legs and pannus with associated SOB. While in the ED, given lasix and admitted for acute decompensation of diastolic CHF. She was also noted initially to have an AKI on CKD 3. She is currently being diuresed with intravenous lasix. Cardiology following. Discharge home once volume status improved   Assessment & Plan:   Principal Problem:   Acute on chronic diastolic congestive heart failure (HCC) Active Problems:   Hypertension, uncontrolled   PAD (peripheral artery disease) (HCC)   Carotid artery disease ([place])   Hx of CABG 2012   Diabetes mellitus type II, uncontrolled (HCC)   CHF (congestive heart failure) (HCC)   Recurrent pleural effusion on right   AKI (acute kidney injury) ([place])  1. Acute on chronic diastolic CHF. Beta blockers held due to bradycardia. ECHO shows normal systolic function with EF of 55-60% with results as below. She still has evidence of significant volume overload and is more short of breath today. Will need continued diuresis with IV lasix. Appreciate cardiology input. 2. Acute on chronic resp failure with hypoxia. Related to CHF. Chronically on 2L of oxygen. Will try and wean down to baseline oxygen requirement 3. AKI on CKD stage 3. Creatinine appears to be improving with diuresis. Will continue to monitor closely in the setting of diuretics. 4. Chronic bilateral hip pain. Pain meds PRN. 5. HTN. Continue outpatient regimen 6. PAD. Continue on plavix 7. CAD. Continue outpatient regimen. 8. DM type 2. Blood sugars stable. Continue outpatient regimen. 9. Hx of CABG  2012  DVT prophylaxis: Lovenox Code Status: DNR Family Communication: discussed with patient. No family present at bedside. Disposition Plan: Discharge home once improved, likely 1-2 days.    Consultants:   Cardiology  Procedures:   ECHO Study Conclusions  - Left ventricle: The cavity size was normal. There was moderate  concentric hypertrophy. Systolic function was normal. The  estimated ejection fraction was in the range of 55% to 60%. Wall  motion was normal; there were no regional wall motion  abnormalities. Features are consistent with a pseudonormal left  ventricular filling pattern, with concomitant abnormal relaxation  and increased filling pressure (grade 2 diastolic dysfunction).  Doppler parameters are consistent with elevated ventricular  end-diastolic filling pressure. - Ventricular septum: The contour showed diastolic flattening and  systolic flattening. - Aortic valve: Trileaflet; normal thickness leaflets. There was  trivial regurgitation. - Aortic root: The aortic root was normal in size. - Ascending aorta: The ascending aorta was normal in size. - Mitral valve: Structurally normal valve. There was mild  regurgitation. - Left atrium: The atrium was moderately dilated. - Right ventricle: Systolic function was normal. - Right atrium: The atrium was normal in size. - Tricuspid valve: There was mild regurgitation. - Pulmonic valve: There was trivial regurgitation. - Pulmonary arteries: Systolic pressure was moderately increased.  PA peak pressure: 52 mm Hg (S). - Inferior vena cava: The vessel was normal in size. - Pericardium, extracardiac: There was no pericardial effusion.  Antimicrobials:  none    Subjective: Still feels short of breath. No chest pain  Objective: Filed Vitals:  07/26/15 1530 07/26/15 2104 07/27/15 0500 07/27/15 0652  BP:  136/33  153/36  Pulse:    55  Temp:  98.3 F (36.8 C)  97.8 F (36.6 C)  TempSrc:  Oral   Oral  Resp:  20  20  Height:      Weight:   91.8 kg (202 lb 6.1 oz)   SpO2: 94% 95%  96%    Intake/Output Summary (Last 24 hours) at 07/27/15 0814 Last data filed at 07/26/15 2300  Gross per 24 hour  Intake    720 ml  Output    600 ml  Net    120 ml   Filed Weights   07/25/15 0651 07/26/15 0542 07/27/15 0500  Weight: 96 kg (211 lb 10.3 oz) 94.6 kg (208 lb 8.9 oz) 91.8 kg (202 lb 6.1 oz)   Examination:  General exam: Appears calm and comfortable  Respiratory system: Crackles at bases. Respiratory effort normal. Cardiovascular system: S1 & S2 heard, RRR. No JVD, murmurs, rubs, gallops or clicks. 1+ pedal edema. Gastrointestinal system: Abdomen is nondistended, soft and nontender. No organomegaly or masses felt. Normal bowel sounds heard. Central nervous system: Alert and oriented. No focal neurological deficits. Extremities: Symmetric 5 x 5 power. Skin: No rashes, lesions or ulcers Psychiatry: Judgement and insight appear normal. Mood & affect appropriate.   Data Reviewed: I have personally reviewed following labs and imaging studies  CBC:  Recent Labs Lab 07/23/15 1629  WBC 7.4  NEUTROABS 5.6  HGB 10.3*  HCT 34.6*  MCV 89.9  PLT 710   Basic Metabolic Panel:  Recent Labs Lab 07/23/15 1629 07/24/15 0513 07/25/15 0624 07/26/15 0627 07/27/15 0706  NA 134* 138 139 138 139  K 5.0 3.9 4.2 4.2 4.0  CL 99* 98* 96* 95* 94*  CO2 30 31 35* 34* 36*  GLUCOSE 231* 112* 76 96 62*  BUN 46* 40* 44* 47* 45*  CREATININE 1.78* 1.39* 1.56* 1.63* 1.40*  CALCIUM 8.7* 8.7* 9.0 9.1 9.3   Cardiac Enzymes:  Recent Labs Lab 07/23/15 1629 07/23/15 2045  TROPONINI 0.03* 0.03*   Urine analysis:    Component Value Date/Time   COLORURINE YELLOW 10/10/2014 [place] 10/10/2014 2345   LABSPEC 1.010 10/10/2014 2345   PHURINE 6.0 10/10/2014 2345   GLUCOSEU NEGATIVE 10/10/2014 2345   HGBUR TRACE* 10/10/2014 [place] 10/10/2014 2345   KETONESUR  NEGATIVE 10/10/2014 2345   PROTEINUR 30* 10/10/2014 2345   UROBILINOGEN 0.2 10/10/2014 2345   NITRITE NEGATIVE 10/10/2014 2345   LEUKOCYTESUR NEGATIVE 10/10/2014 2345   Sepsis Labs: _0 (procalcitonin:4,lacticidven:4)  )No results found for this or any previous visit (from the past 240 hour(s)).   Radiology Studies: No results found.  Scheduled Meds: . amLODipine  10 mg Oral Daily  . aspirin EC  81 mg Oral Daily  . atorvastatin  40 mg Oral q1800  . citalopram  40 mg Oral Daily  . enoxaparin (LOVENOX) injection  40 mg Subcutaneous Q24H  . furosemide  60 mg Intravenous Q12H  . gabapentin  300 mg Oral Daily  . gabapentin  600 mg Oral QHS  . glimepiride  2 mg Oral BID AC  . hydrALAZINE  25 mg Oral Q8H  . insulin aspart  0-15 Units Subcutaneous TID WC  . insulin aspart  0-5 Units Subcutaneous QHS  . insulin aspart  3 Units Subcutaneous TID WC  . insulin detemir  7 Units Subcutaneous QHS  . isosorbide mononitrate  30 mg Oral  QPM  . lisinopril  10 mg Oral Daily  . multivitamin with minerals  1 tablet Oral Daily  . sodium chloride flush  3 mL Intravenous Q12H   Continuous Infusions:    LOS: 4 days    Time spent: 25 minutes  Kathie Dike, MD [facility] Pager [phone]  If 7PM-7AM, please contact night-coverage [email or web address] Password [email or web address] 07/27/2015, 8:14 AM

## 2015-07-27 NOTE — Progress Notes (Signed)
Pt CBG 60. Pt asymptomatic and was offered 4 ozs juice. Will continue to monitor pt throughout the shift and will recheck CBG.

## 2015-07-27 NOTE — Progress Notes (Signed)
Pt CBG 122.

## 2015-07-27 NOTE — Progress Notes (Signed)
Received verbal order from MD to give pt scheduled  amlodipine and lisinopril.

## 2015-07-27 NOTE — Progress Notes (Signed)
Primary Cardiologist: Rozann Lesches MD  Cardiology Specific Problem List: 1. Acute on Chronic Diastolic CHF 2. Bradycardia  3. Hypertension  4. CAD  Subjective:    Complaining of right sided chest/back soreness. Constipated.   Objective:   Temp:  [97.8 F (36.6 C)-98.3 F (36.8 C)] 97.8 F (36.6 C) (07/18 0652) Pulse Rate:  [50-55] 55 (07/18 0652) Resp:  [18-20] 20 (07/18 0652) BP: (136-160)/(33-77) 153/36 mmHg (07/18 0652) SpO2:  [94 %-98 %] 96 % (07/18 0652) Weight:  [202 lb 6.1 oz (91.8 kg)] 202 lb 6.1 oz (91.8 kg) (07/18 0500) Last BM Date: 07/23/15  Filed Weights   07/25/15 0651 07/26/15 0542 07/27/15 0500  Weight: 211 lb 10.3 oz (96 kg) 208 lb 8.9 oz (94.6 kg) 202 lb 6.1 oz (91.8 kg)    Intake/Output Summary (Last 24 hours) at 07/27/15 0938 Last data filed at 07/26/15 2300  Gross per 24 hour  Intake    480 ml  Output    600 ml  Net   -120 ml    Telemetry: Sinus bradycardia. Rates in the 40's.   Exam:  General: Chronically ill appearing, no acute distress.  Lungs: Nonlabored. Pleurx catheter on the right. Bibasilar crackles.   Cardiac: No elevated JVP or bruits. RRR, no gallop or rub.   Abdomen: Normoactive bowel sounds, mild tenderness, nondistended.  Extremities: No pitting edema, distal pulses full.  Echocardiogram 05/26/2015 Left ventricle: The cavity size was normal. There was moderate  concentric hypertrophy. Systolic function was normal. The  estimated ejection fraction was in the range of 55% to 60%. Wall  motion was normal; there were no regional wall motion  abnormalities. Features are consistent with a pseudonormal left  ventricular filling pattern, with concomitant abnormal relaxation  and increased filling pressure (grade 2 diastolic dysfunction).  Doppler parameters are consistent with elevated ventricular  end-diastolic filling pressure. - Ventricular septum: The contour showed diastolic flattening and  systolic  flattening. - Aortic valve: Trileaflet; normal thickness leaflets. There was  trivial regurgitation. - Aortic root: The aortic root was normal in size. - Ascending aorta: The ascending aorta was normal in size. - Mitral valve: Structurally normal valve. There was mild  regurgitation. - Left atrium: The atrium was moderately dilated. - Right ventricle: Systolic function was normal. - Right atrium: The atrium was normal in size. - Tricuspid valve: There was mild regurgitation. - Pulmonic valve: There was trivial regurgitation. - Pulmonary arteries: Systolic pressure was moderately increased.  PA peak pressure: 52 mm Hg (S). - Inferior vena cava: The vessel was normal in size. - Pericardium, extracardiac: There was no pericardial effusion.  Lab Results:  Basic Metabolic Panel:  Recent Labs Lab 07/25/15 0624 07/26/15 0627 07/27/15 0706  NA 139 138 139  K 4.2 4.2 4.0  CL 96* 95* 94*  CO2 35* 34* 36*  GLUCOSE 76 96 62*  BUN 44* 47* 45*  CREATININE 1.56* 1.63* 1.40*  CALCIUM 9.0 9.1 9.3    CBC:  Recent Labs Lab 07/23/15 1629  WBC 7.4  HGB 10.3*  HCT 34.6*  MCV 89.9  PLT 186    Cardiac Enzymes:  Recent Labs Lab 07/23/15 1629 07/23/15 2045  TROPONINI 0.03* 0.03*     Medications:   Scheduled Medications: . amLODipine  10 mg Oral Daily  . aspirin EC  81 mg Oral Daily  . atorvastatin  40 mg Oral q1800  . citalopram  40 mg Oral Daily  . enoxaparin (LOVENOX) injection  40 mg Subcutaneous  Q24H  . furosemide  60 mg Intravenous Q12H  . gabapentin  300 mg Oral Daily  . gabapentin  600 mg Oral QHS  . glimepiride  2 mg Oral BID AC  . hydrALAZINE  25 mg Oral Q8H  . insulin aspart  0-15 Units Subcutaneous TID WC  . insulin aspart  0-5 Units Subcutaneous QHS  . insulin aspart  3 Units Subcutaneous TID WC  . insulin detemir  7 Units Subcutaneous QHS  . isosorbide mononitrate  30 mg Oral QPM  . lisinopril  10 mg Oral Daily  . multivitamin with minerals  1 tablet  Oral Daily  . sodium chloride flush  3 mL Intravenous Q12H    PRN Medications: sodium chloride, acetaminophen, acetaminophen, albuterol, magnesium hydroxide, nitroGLYCERIN, ondansetron (ZOFRAN) IV, oxyCODONE, sodium chloride flush   Assessment and Plan:   1. Acute on Chronic Diastolic CHF: She has diuresed 3 liters since admission. Creatinine 1.40, CO2 36. Would continue IV lasix for now.  2. Bradycardia: Review of telemetry shows bradycardia with rates in the 40's. No pauses. Coreg has been discontinued. TSH 1.210. Will monitor.  3. Hypertension: Moderately elevated. On hydralazine, lisinopril, isosorbide and amlodipine.   4. CAD: Hx of NSTEMI and CABG X 4 in 2012. Cath prior revealed 60% right ostial RCA, (left IMA to LAD, saphenous vein graft to posterior lateral, saphenous vein graft to obtuse marginal, saphenous vein graft to diagonal). No complaints of angina only at thoracentesis site on right.     Phill Myron. Lawrence NP [place]  07/27/2015, 9:38 AM    Attending note:  Patient seen and examined. Reviewed hospital course and modified above note by Ms. Lawrence NP. Ms. Narine has diuresed approximately 3 L so far, still not at baseline. She reports feeling of increased abdominal girth and bloating, still short of breath. On examination systolic blood pressure 421 to 150, heart rate in the 40s to 50s in sinus rhythm by telemetry. Lungs exhibit decreased breath sounds at the bases, abdomen is protuberant, nontender, no pitting edema on examination of the legs. She does have venous stasis. Coreg was stopped due to bradycardia. Creatinine stable at 1.4. Plan to continue Lasix 60 mg IV twice daily for now for management of acute on chronic diastolic heart failure. Follow-up echocardiogram noted above.  Satira Sark, M.D., F.A.C.C.

## 2015-07-28 DIAGNOSIS — I509 Heart failure, unspecified: Secondary | ICD-10-CM | POA: Insufficient documentation

## 2015-07-28 LAB — BASIC METABOLIC PANEL
Anion gap: 9 (ref 5–15)
BUN: 51 mg/dL — AB (ref 6–20)
CHLORIDE: 91 mmol/L — AB (ref 101–111)
CO2: 37 mmol/L — AB (ref 22–32)
Calcium: 9.1 mg/dL (ref 8.9–10.3)
Creatinine, Ser: 1.68 mg/dL — ABNORMAL HIGH (ref 0.44–1.00)
GFR calc Af Amer: 35 mL/min — ABNORMAL LOW (ref >60)
GFR calc non Af Amer: 30 mL/min — ABNORMAL LOW (ref >60)
GLUCOSE: 73 mg/dL (ref 65–99)
POTASSIUM: 4.1 mmol/L (ref 3.5–5.1)
Sodium: 137 mmol/L (ref 135–145)

## 2015-07-28 LAB — GLUCOSE, CAPILLARY
GLUCOSE-CAPILLARY: 99 mg/dL (ref 65–99)
GLUCOSE-CAPILLARY: 126 mg/dL — AB (ref 65–99)
Glucose-Capillary: 56 mg/dL — ABNORMAL LOW (ref 65–99)
Glucose-Capillary: 188 mg/dL — ABNORMAL HIGH (ref 65–99)
Glucose-Capillary: 259 mg/dL — ABNORMAL HIGH (ref 65–99)

## 2015-07-28 MED ORDER — SORBITOL 70 % SOLN
960.0000 mL | TOPICAL_OIL | Freq: Once | ORAL | Status: AC
  Administered 2015-07-28: 960 mL via RECTAL
  Filled 2015-07-28: qty 240

## 2015-07-28 NOTE — Care Management Important Message (Signed)
Important Message  Patient Details  Name: [name] [name] MRN: [id number] Date of Birth: Nov 22, 1945   Medicare Important Message Given:  Yes    William Laske, [place]un[place], RN 07/28/2015, 1:09 PM

## 2015-07-28 NOTE — Progress Notes (Addendum)
PROGRESS NOTE    [name] [name]  PYP:[id number] DOB: Sep 01, 1945 DOA: 07/23/2015 PCP: Glenda Chroman, MD Outpatient Specialists:  Cardiology; Dr Gwenlyn Found  Cardiology; Dr Fletcher Anon  Cardiothoracic surgery; Dr. Kerby Less  Vascular surgery; Dr. Oneida Alar   Brief Narrative:  50 yof with a hx of CHF, Chronic resp failure on 2L home O2 at night, DM, HTn, CAD, presented with complaints of worsening edema in her legs and pannus with associated SOB. While in the ED, given lasix and admitted for acute decompensation of diastolic CHF. She was also noted initially to have an AKI on CKD 3. She is currently being diuresed with intravenous lasix. Cardiology following. Discharge home once volume status improved   Assessment & Plan:   Principal Problem:   Acute on chronic diastolic congestive heart failure (HCC) Active Problems:   Hypertension, uncontrolled   PAD (peripheral artery disease) (HCC)   Carotid artery disease (HCC)   Hx of CABG 2012   Diabetes mellitus type II, uncontrolled (HCC)   CHF (congestive heart failure) (HCC)   Recurrent pleural effusion on right   AKI (acute kidney injury) ([place])   CKD (chronic kidney disease) stage 3, GFR 30-59 ml/min   Chronic kidney disease (CKD)  1. Acute on chronic diastolic CHF. Beta blockers held due to bradycardia. ECHO shows normal systolic function with EF of 55-60% with results as below. She still has evidence of significant volume overload although she claims to feel better today. Appreciate input by Cardiology. Cont IV lasix for now. 2. Acute on chronic resp failure with hypoxia. Related to CHF. Chronically on 2L of oxygen. Will try and wean down to baseline oxygen requirement 3. AKI on CKD stage 3. Creatinine appears to be improving with diuresis. Will continue to monitor closely in the setting of diuretics. 4. Chronic bilateral hip pain. Pain meds PRN. 5. HTN. Continue outpatient regimen 6. PAD. Continue on plavix 7. CAD. Continue outpatient regimen. 8. DM  type 2. Blood sugars borderline low. Will stop Amaryl per Diabetic Coordinator recs.  9. Hx of CABG 2012. Stable  DVT prophylaxis: Lovenox Code Status: DNR Family Communication: discussed with patient. No family present at bedside. Disposition Plan: Discharge home once improved, likely 1-2 days.    Consultants:   Cardiology  Procedures:   ECHO Study Conclusions  - Left ventricle: The cavity size was normal. There was moderate  concentric hypertrophy. Systolic function was normal. The  estimated ejection fraction was in the range of 55% to 60%. Wall  motion was normal; there were no regional wall motion  abnormalities. Features are consistent with a pseudonormal left  ventricular filling pattern, with concomitant abnormal relaxation  and increased filling pressure (grade 2 diastolic dysfunction).  Doppler parameters are consistent with elevated ventricular  end-diastolic filling pressure. - Ventricular septum: The contour showed diastolic flattening and  systolic flattening. - Aortic valve: Trileaflet; normal thickness leaflets. There was  trivial regurgitation. - Aortic root: The aortic root was normal in size. - Ascending aorta: The ascending aorta was normal in size. - Mitral valve: Structurally normal valve. There was mild  regurgitation. - Left atrium: The atrium was moderately dilated. - Right ventricle: Systolic function was normal. - Right atrium: The atrium was normal in size. - Tricuspid valve: There was mild regurgitation. - Pulmonic valve: There was trivial regurgitation. - Pulmonary arteries: Systolic pressure was moderately increased.  PA peak pressure: 52 mm Hg (S). - Inferior vena cava: The vessel was normal in size. - Pericardium, extracardiac: There was no pericardial  effusion.  Antimicrobials:  none    Subjective: Reports feeling somewhat better  Objective: Filed Vitals:   07/28/15 0524 07/28/15 1004 07/28/15 1333 07/28/15 1449    BP: 150/65 127/52 134/64 124/37  Pulse: 54  56   Temp: 98.2 F (36.8 C)  98.5 F (36.9 C)   TempSrc: Oral  Oral   Resp: 20  18   Height:      Weight: 92.3 kg (203 lb 7.8 oz)     SpO2: 96%  97%     Intake/Output Summary (Last 24 hours) at 07/28/15 1706 Last data filed at 07/28/15 1300  Gross per 24 hour  Intake    483 ml  Output      0 ml  Net    483 ml   Filed Weights   07/26/15 0542 07/27/15 0500 07/28/15 0524  Weight: 94.6 kg (208 lb 8.9 oz) 91.8 kg (202 lb 6.1 oz) 92.3 kg (203 lb 7.8 oz)   Examination:  General exam: Appears calm and comfortable  Respiratory system: Crackles at bases. Respiratory effort normal. Cardiovascular system: S1 & S2 heard, RRR. Gastrointestinal system: Abdomen is nondistended, soft and nontender. No organomegaly or masses felt. Normal bowel sounds heard. Central nervous system: Alert and oriented. No focal neurological deficits. Extremities: Symmetric 5 x 5 power. Skin: No rashes, lesions or ulcers Psychiatry: Judgement and insight appear normal. Mood & affect appropriate.   Data Reviewed: I have personally reviewed following labs and imaging studies  CBC:  Recent Labs Lab 07/23/15 1629  WBC 7.4  NEUTROABS 5.6  HGB 10.3*  HCT 34.6*  MCV 89.9  PLT 086   Basic Metabolic Panel:  Recent Labs Lab 07/24/15 0513 07/25/15 0624 07/26/15 0627 07/27/15 0706 07/28/15 0538  NA 138 139 138 139 137  K 3.9 4.2 4.2 4.0 4.1  CL 98* 96* 95* 94* 91*  CO2 31 35* 34* 36* 37*  GLUCOSE 112* 76 96 62* 73  BUN 40* 44* 47* 45* 51*  CREATININE 1.39* 1.56* 1.63* 1.40* 1.68*  CALCIUM 8.7* 9.0 9.1 9.3 9.1   Cardiac Enzymes:  Recent Labs Lab 07/23/15 1629 07/23/15 2045  TROPONINI 0.03* 0.03*   Urine analysis:    Component Value Date/Time   COLORURINE YELLOW 10/10/2014 [place] 10/10/2014 2345   LABSPEC 1.010 10/10/2014 2345   PHURINE 6.0 10/10/2014 2345   GLUCOSEU NEGATIVE 10/10/2014 2345   HGBUR TRACE* 10/10/2014 [place] 10/10/2014 2345   KETONESUR NEGATIVE 10/10/2014 2345   PROTEINUR 30* 10/10/2014 2345   UROBILINOGEN 0.2 10/10/2014 2345   NITRITE NEGATIVE 10/10/2014 2345   LEUKOCYTESUR NEGATIVE 10/10/2014 2345   Sepsis Labs: _0 (procalcitonin:4,lacticidven:4)  )No results found for this or any previous visit (from the past 240 hour(s)).   Radiology Studies: No results found.  Scheduled Meds: . amLODipine  10 mg Oral Daily  . aspirin EC  81 mg Oral Daily  . atorvastatin  40 mg Oral q1800  . citalopram  40 mg Oral Daily  . enoxaparin (LOVENOX) injection  40 mg Subcutaneous Q24H  . furosemide  60 mg Intravenous Q12H  . gabapentin  300 mg Oral Daily  . gabapentin  600 mg Oral QHS  . hydrALAZINE  25 mg Oral Q8H  . insulin aspart  0-15 Units Subcutaneous TID WC  . insulin aspart  0-5 Units Subcutaneous QHS  . insulin aspart  3 Units Subcutaneous TID WC  . insulin detemir  7 Units Subcutaneous QHS  . isosorbide mononitrate  30 mg Oral QPM  . lisinopril  10 mg Oral Daily  . multivitamin with minerals  1 tablet Oral Daily  . sodium chloride flush  3 mL Intravenous Q12H  . sorbitol, milk of mag, mineral oil, glycerin (SMOG) enema  960 mL Rectal Once   Continuous Infusions:    LOS: 5 days    Time spent: 25 minutes  Kathie Dike, MD [facility] Pager [phone]  If 7PM-7AM, please contact night-coverage [email or web address] Password [email or web address] 07/28/2015, 5:06 PM

## 2015-07-28 NOTE — Progress Notes (Signed)
Pt CBG 99. Will continue to monitor patient throughout the shift

## 2015-07-28 NOTE — Progress Notes (Signed)
Pt CBG 56. Pt asymptomatic. Pt was offered 4 ozs apple juice. Will recheck patient's CBG in 15 minutes.

## 2015-07-28 NOTE — Progress Notes (Signed)
Inpatient Diabetes Program Recommendations  AACE/ADA: New Consensus Statement on Inpatient Glycemic Control (2015)  Target Ranges:  Prepandial:   less than 140 mg/dL      Peak postprandial:   less than 180 mg/dL (1-2 hours)      Critically ill patients:  140 - 180 mg/dL   Results for [name] [name], [name] [name] (MRN [id number]) as of 07/28/2015 10:28  Ref. Range 07/27/2015 07:23 07/27/2015 08:05 07/27/2015 11:22 07/27/2015 16:07 07/27/2015 20:34 07/28/2015 07:17 07/28/2015 08:01  Glucose-Capillary Latest Ref Range: 65-99 mg/dL 60 (L) 122 (H) 199 (H) 123 (H) 187 (H) 56 (L) 99   Review of Glycemic Control  Diabetes history: DM2 Outpatient Diabetes medications: Amaryl 2 mg BID, Levemir 7 units QHS, Novolog 3 TID with meals Current orders for Inpatient glycemic control: Levemir 7 units QHS, Novolog 0-15 units TID with meals, Novolog 0-5 units QHS, Amaryl 2 mg BID, Novolog 3 units TID with meals  Inpatient Diabetes Program Recommendations: Oral Agents: Fasting glucose 56 mg/dl this morning and was 60 mg/dl yesterday. While inpatient, please consider discontinuing Amaryl.   Thanks, Barnie Alderman, RN, MSN, CDE Diabetes Coordinator Inpatient Diabetes Program [phone] (Team Pager from [place] to [place]) [phone] ([place] office) [phone] [facility] office) [phone] [facility] office)

## 2015-07-28 NOTE — Progress Notes (Signed)
Primary cardiologist: Dr. Satira Sark  Seen for followup: Acute on chronic diastolic heart failure  Subjective:    Slept better last night. Breathing more easily. No chest pain. Appetite fair.  Objective:   Temp:  [98.2 F (36.8 C)-98.9 F (37.2 C)] 98.2 F (36.8 C) (07/19 0524) Pulse Rate:  [47-54] 54 (07/19 0524) Resp:  [20] 20 (07/19 0524) BP: (114-150)/(34-65) 150/65 mmHg (07/19 0524) SpO2:  [93 %-98 %] 96 % (07/19 0524) Weight:  [203 lb 7.8 oz (92.3 kg)] 203 lb 7.8 oz (92.3 kg) (07/19 0524) Last BM Date: 07/23/15  Filed Weights   07/26/15 0542 07/27/15 0500 07/28/15 0524  Weight: 208 lb 8.9 oz (94.6 kg) 202 lb 6.1 oz (91.8 kg) 203 lb 7.8 oz (92.3 kg)    Intake/Output Summary (Last 24 hours) at 07/28/15 0954 Last data filed at 07/27/15 1302  Gross per 24 hour  Intake    243 ml  Output      0 ml  Net    243 ml    Telemetry: Sinus bradycardia without pauses.  Exam:  General: Chronically ill appearing, no distress.  Lungs: Clear anteriorly.  Cardiac: RRR, no gallop.  Abdomen: NABS.  Extremities: No pitting edema.  Lab Results:  Basic Metabolic Panel:  Recent Labs Lab 07/26/15 0627 07/27/15 0706 07/28/15 0538  NA 138 139 137  K 4.2 4.0 4.1  CL 95* 94* 91*  CO2 34* 36* 37*  GLUCOSE 96 62* 73  BUN 47* 45* 51*  CREATININE 1.63* 1.40* 1.68*  CALCIUM 9.1 9.3 9.1    CBC:  Recent Labs Lab 07/23/15 1629  WBC 7.4  HGB 10.3*  HCT 34.6*  MCV 89.9  PLT 186    Cardiac Enzymes:  Recent Labs Lab 07/23/15 1629 07/23/15 2045  TROPONINI 0.03* 0.03*   Echocardiogram 07/24/2015:  Study Conclusions  - Left ventricle: The cavity size was normal. There was moderate  concentric hypertrophy. Systolic function was normal. The  estimated ejection fraction was in the range of 55% to 60%. Wall  motion was normal; there were no regional wall motion  abnormalities. Features are consistent with a pseudonormal left  ventricular filling  pattern, with concomitant abnormal relaxation  and increased filling pressure (grade 2 diastolic dysfunction).  Doppler parameters are consistent with elevated ventricular  end-diastolic filling pressure. - Ventricular septum: The contour showed diastolic flattening and  systolic flattening. - Aortic valve: Trileaflet; normal thickness leaflets. There was  trivial regurgitation. - Aortic root: The aortic root was normal in size. - Ascending aorta: The ascending aorta was normal in size. - Mitral valve: Structurally normal valve. There was mild  regurgitation. - Left atrium: The atrium was moderately dilated. - Right ventricle: Systolic function was normal. - Right atrium: The atrium was normal in size. - Tricuspid valve: There was mild regurgitation. - Pulmonic valve: There was trivial regurgitation. - Pulmonary arteries: Systolic pressure was moderately increased.  PA peak pressure: 52 mm Hg (S). - Inferior vena cava: The vessel was normal in size. - Pericardium, extracardiac: There was no pericardial effusion.   Medications:   Scheduled Medications: . amLODipine  10 mg Oral Daily  . aspirin EC  81 mg Oral Daily  . atorvastatin  40 mg Oral q1800  . citalopram  40 mg Oral Daily  . enoxaparin (LOVENOX) injection  40 mg Subcutaneous Q24H  . furosemide  60 mg Intravenous Q12H  . gabapentin  300 mg Oral Daily  . gabapentin  600 mg Oral QHS  .  glimepiride  2 mg Oral BID AC  . hydrALAZINE  25 mg Oral Q8H  . insulin aspart  0-15 Units Subcutaneous TID WC  . insulin aspart  0-5 Units Subcutaneous QHS  . insulin aspart  3 Units Subcutaneous TID WC  . insulin detemir  7 Units Subcutaneous QHS  . isosorbide mononitrate  30 mg Oral QPM  . lisinopril  10 mg Oral Daily  . multivitamin with minerals  1 tablet Oral Daily  . sodium chloride flush  3 mL Intravenous Q12H     PRN Medications:  sodium chloride, acetaminophen, acetaminophen, albuterol, magnesium hydroxide,  nitroGLYCERIN, ondansetron (ZOFRAN) IV, oxyCODONE, sodium chloride flush   Assessment:   1. Acute on chronic diastolic heart failure with volume overload. Patient continues to diurese on IV Lasix. Recent weight 202-203 pounds. Ideally would like to get her back in the 190s. LVEF 55-60% by recent follow-up echocardiogram. She has elevated LV filling pressures and also moderate pulmonary hypertension.  2. Chronic/recurrent right pleural effusion with Pleurx catheter in place.  3. Chronic hypoxic respiratory failure, followed by Dr. Luan Pulling as an outpatient and on supplemental oxygen.  4. Multivessel CAD status post CABG, currently no active angina symptoms.  5. Bradycardia, no pauses by telemetry. Coreg was stopped. TSH normal.  6. Acute on chronic renal failure, creatinine 1.7.   Plan/Discussion:    Will continue IV Lasix for now. Still try to obtain further diuresis, follow creatinine. Suspect that we will have to tolerate some degree of renal insufficiency to maintain reasonable volume status. She is nearing a more optimal weight.  Satira Sark, M.D., F.A.C.C.

## 2015-07-28 NOTE — Progress Notes (Signed)
Approximately 300 cc of cloudy yellow/brown fluid drained from PleurX,. Site appears WNL, dressing replaced. Patient to be drained again on Sunday.

## 2015-07-29 DIAGNOSIS — N179 Acute kidney failure, unspecified: Secondary | ICD-10-CM

## 2015-07-29 DIAGNOSIS — I779 Disorder of arteries and arterioles, unspecified: Secondary | ICD-10-CM

## 2015-07-29 LAB — BASIC METABOLIC PANEL
ANION GAP: 12 (ref 5–15)
BUN: 67 mg/dL — AB (ref 6–20)
CHLORIDE: 90 mmol/L — AB (ref 101–111)
CO2: 33 mmol/L — AB (ref 22–32)
Calcium: 8.6 mg/dL — ABNORMAL LOW (ref 8.9–10.3)
Creatinine, Ser: 2.32 mg/dL — ABNORMAL HIGH (ref 0.44–1.00)
GFR calc Af Amer: 24 mL/min — ABNORMAL LOW (ref >60)
GFR calc non Af Amer: 20 mL/min — ABNORMAL LOW (ref >60)
GLUCOSE: 101 mg/dL — AB (ref 65–99)
POTASSIUM: 4.2 mmol/L (ref 3.5–5.1)
Sodium: 135 mmol/L (ref 135–145)

## 2015-07-29 LAB — GLUCOSE, CAPILLARY
GLUCOSE-CAPILLARY: 106 mg/dL — AB (ref 65–99)
GLUCOSE-CAPILLARY: 140 mg/dL — AB (ref 65–99)
Glucose-Capillary: 90 mg/dL (ref 65–99)
Glucose-Capillary: 177 mg/dL — ABNORMAL HIGH (ref 65–99)

## 2015-07-29 MED ORDER — ENOXAPARIN SODIUM 30 MG/0.3ML ~~LOC~~ SOLN
30.0000 mg | SUBCUTANEOUS | Status: DC
  Administered 2015-07-29 – 2015-07-31 (×3): 30 mg via SUBCUTANEOUS
  Filled 2015-07-29 (×3): qty 0.3

## 2015-07-29 MED ORDER — TORSEMIDE 20 MG PO TABS
40.0000 mg | ORAL_TABLET | Freq: Every day | ORAL | Status: DC
  Administered 2015-07-30 – 2015-08-01 (×3): 40 mg via ORAL
  Filled 2015-07-29 (×3): qty 2

## 2015-07-29 NOTE — Progress Notes (Signed)
Primary cardiologist: Dr. Satira Sark  Seen for followup: Acute on chronic diastolic heart failure  Subjective:    Generally better but still feels some abdominal bloating. Stable appetite. No chest pain.  Objective:   Temp:  [98.3 F (36.8 C)-98.7 F (37.1 C)] 98.7 F (37.1 C) (07/20 0651) Pulse Rate:  [55-62] 62 (07/20 0651) Resp:  [18] 18 (07/20 0651) BP: (124-145)/(31-72) 145/72 mmHg (07/20 1011) SpO2:  [97 %-98 %] 97 % (07/20 0651) Weight:  [207 lb 0.2 oz (93.9 kg)] 207 lb 0.2 oz (93.9 kg) (07/20 0700) Last BM Date: 07/28/15  Filed Weights   07/27/15 0500 07/28/15 0524 07/29/15 0700  Weight: 202 lb 6.1 oz (91.8 kg) 203 lb 7.8 oz (92.3 kg) 207 lb 0.2 oz (93.9 kg)    Intake/Output Summary (Last 24 hours) at 07/29/15 1045 Last data filed at 07/28/15 1300  Gross per 24 hour  Intake    243 ml  Output      0 ml  Net    243 ml    Telemetry: Sinus bradycardia without pauses.  Exam:  General: Chronically ill appearing, no distress.  Lungs: Clear anteriorly.  Cardiac: RRR, no gallop.  Abdomen: NABS. Protuberant.  Extremities: No pitting edema.  Lab Results:  Basic Metabolic Panel:  Recent Labs Lab 07/27/15 0706 07/28/15 0538 07/29/15 0530  NA 139 137 135  K 4.0 4.1 4.2  CL 94* 91* 90*  CO2 36* 37* 33*  GLUCOSE 62* 73 101*  BUN 45* 51* 67*  CREATININE 1.40* 1.68* 2.32*  CALCIUM 9.3 9.1 8.6*    CBC:  Recent Labs Lab 07/23/15 1629  WBC 7.4  HGB 10.3*  HCT 34.6*  MCV 89.9  PLT 186    Cardiac Enzymes:  Recent Labs Lab 07/23/15 1629 07/23/15 2045  TROPONINI 0.03* 0.03*   Echocardiogram 07/24/2015:  Study Conclusions  - Left ventricle: The cavity size was normal. There was moderate  concentric hypertrophy. Systolic function was normal. The  estimated ejection fraction was in the range of 55% to 60%. Wall  motion was normal; there were no regional wall motion  abnormalities. Features are consistent with a pseudonormal  left  ventricular filling pattern, with concomitant abnormal relaxation  and increased filling pressure (grade 2 diastolic dysfunction).  Doppler parameters are consistent with elevated ventricular  end-diastolic filling pressure. - Ventricular septum: The contour showed diastolic flattening and  systolic flattening. - Aortic valve: Trileaflet; normal thickness leaflets. There was  trivial regurgitation. - Aortic root: The aortic root was normal in size. - Ascending aorta: The ascending aorta was normal in size. - Mitral valve: Structurally normal valve. There was mild  regurgitation. - Left atrium: The atrium was moderately dilated. - Right ventricle: Systolic function was normal. - Right atrium: The atrium was normal in size. - Tricuspid valve: There was mild regurgitation. - Pulmonic valve: There was trivial regurgitation. - Pulmonary arteries: Systolic pressure was moderately increased.  PA peak pressure: 52 mm Hg (S). - Inferior vena cava: The vessel was normal in size. - Pericardium, extracardiac: There was no pericardial effusion.   Medications:   Scheduled Medications: . amLODipine  10 mg Oral Daily  . aspirin EC  81 mg Oral Daily  . atorvastatin  40 mg Oral q1800  . citalopram  40 mg Oral Daily  . enoxaparin (LOVENOX) injection  40 mg Subcutaneous Q24H  . furosemide  60 mg Intravenous Q12H  . gabapentin  300 mg Oral Daily  . gabapentin  600 mg  Oral QHS  . hydrALAZINE  25 mg Oral Q8H  . insulin aspart  0-15 Units Subcutaneous TID WC  . insulin aspart  0-5 Units Subcutaneous QHS  . insulin aspart  3 Units Subcutaneous TID WC  . insulin detemir  7 Units Subcutaneous QHS  . isosorbide mononitrate  30 mg Oral QPM  . lisinopril  10 mg Oral Daily  . multivitamin with minerals  1 tablet Oral Daily  . sodium chloride flush  3 mL Intravenous Q12H    PRN Medications: sodium chloride, acetaminophen, acetaminophen, albuterol, magnesium hydroxide, nitroGLYCERIN,  ondansetron (ZOFRAN) IV, oxyCODONE, sodium chloride flush   Assessment:   1. Acute on chronic diastolic heart failure with volume overload. Recent weight up to 207 (?) pounds. Ideally would like to get her back in the 190s. LVEF 55-60% by recent follow-up echocardiogram. She has elevated LV filling pressures and also moderate pulmonary hypertension. Intake and output not accurately recorded.  2. Chronic/recurrent right pleural effusion with Pleurx catheter in place.  3. Chronic hypoxic respiratory failure, followed by Dr. Luan Pulling as an outpatient and on supplemental oxygen.  4. Multivessel CAD status post CABG, currently no active angina symptoms.  5. Bradycardia, no pauses by telemetry. Coreg was stopped. TSH normal.  6. Acute on chronic renal failure, creatinine up to 2.3.   Plan/Discussion:    Will change from IV Lasix to oral Demadex. Suspect that we will have to tolerate some degree of renal insufficiency to maintain reasonable volume status, but do not want to push too hard right now. Will try to get her on a stable oral regimen, and I will ultimately plan to have her further evaluated in the CHF clinic given significant difficulty maintaining optimal volume status.  Satira Sark, M.D., F.A.C.C.

## 2015-07-29 NOTE — Care Management Note (Addendum)
Case Management Note  Patient Details  Name: [name] [name] MRN: [id number] Date of Birth: 19-Apr-1945    If discussed at Long Length of Stay Meetings, dates discussed:  07/29/2015  Additional Comments:  Trevontae Lindahl, Chauncey Reading, RN 07/29/2015, 4:02 PM

## 2015-07-29 NOTE — Progress Notes (Signed)
PROGRESS NOTE    [name] [name]  DDU:[id number] DOB: [birth year] DOA: 07/23/2015 PCP: Glenda Chroman, MD Outpatient Specialists:  Cardiology; Dr Gwenlyn Found  Cardiology; Dr Fletcher Anon  Cardiothoracic surgery; Dr. Kerby Less  Vascular surgery; Dr. Oneida Alar   Brief Narrative:  65 yof with a hx of CHF, Chronic resp failure on 2L home O2 at night, DM, HTn, CAD, presented with complaints of worsening edema in her legs and pannus with associated SOB. While in the ED, given lasix and admitted for acute decompensation of diastolic CHF. She was also noted initially to have an AKI on CKD 3. She is currently being diuresed with intravenous lasix. Cardiology following. Discharge home once volume status improved   Assessment & Plan:   Principal Problem:   Acute on chronic diastolic congestive heart failure (HCC) Active Problems:   Hypertension, uncontrolled   PAD (peripheral artery disease) (HCC)   Carotid artery disease (HCC)   Hx of CABG 2012   Diabetes mellitus type II, uncontrolled (HCC)   CHF (congestive heart failure) (HCC)   Recurrent pleural effusion on right   AKI (acute kidney injury) ([place])   CKD (chronic kidney disease) stage 3, GFR 30-59 ml/min   Chronic kidney disease (CKD)   Acute on chronic congestive heart failure ([place])  1. Acute on chronic diastolic CHF. Beta blockers held due to bradycardia. ECHO shows normal systolic function with EF of 55-60% with results as below. Pt reports feeling better today. Diuretics changed to PO per Cardiology given worsening renal function 2. Acute on chronic resp failure with hypoxia. Related to CHF. Chronically on 2L of oxygen. Will try and wean down to baseline oxygen requirement 3. AKI on CKD stage 3. Creatinine now worsening with IV diuretics. Changed to PO today. Monitor renal function 4. Chronic bilateral hip pain. Pain meds PRN. 5. HTN. Continue outpatient regimen 6. PAD. Continue on plavix 7. CAD. Continue outpatient regimen. 8. DM type 2. Blood sugars  borderline low. Will stop Amaryl per Diabetic Coordinator recs.  9. Hx of CABG 2012. Stable  DVT prophylaxis: Lovenox Code Status: DNR Family Communication: discussed with patient. No family present at bedside. Disposition Plan: Discharge home once improved, likely 1-2 days.    Consultants:   Cardiology  Procedures:   ECHO Study Conclusions  - Left ventricle: The cavity size was normal. There was moderate  concentric hypertrophy. Systolic function was normal. The  estimated ejection fraction was in the range of 55% to 60%. Wall  motion was normal; there were no regional wall motion  abnormalities. Features are consistent with a pseudonormal left  ventricular filling pattern, with concomitant abnormal relaxation  and increased filling pressure (grade 2 diastolic dysfunction).  Doppler parameters are consistent with elevated ventricular  end-diastolic filling pressure. - Ventricular septum: The contour showed diastolic flattening and  systolic flattening. - Aortic valve: Trileaflet; normal thickness leaflets. There was  trivial regurgitation. - Aortic root: The aortic root was normal in size. - Ascending aorta: The ascending aorta was normal in size. - Mitral valve: Structurally normal valve. There was mild  regurgitation. - Left atrium: The atrium was moderately dilated. - Right ventricle: Systolic function was normal. - Right atrium: The atrium was normal in size. - Tricuspid valve: There was mild regurgitation. - Pulmonic valve: There was trivial regurgitation. - Pulmonary arteries: Systolic pressure was moderately increased.  PA peak pressure: 52 mm Hg (S). - Inferior vena cava: The vessel was normal in size. - Pericardium, extracardiac: There was no pericardial effusion.  Antimicrobials:  none    Subjective: No complaints today. Reports very good results with SMOG enema given yesterday  Objective: Filed Vitals:   07/29/15 0700 07/29/15 1011  07/29/15 1356 07/29/15 1423  BP:  145/72 144/76 142/72  Pulse:    67  Temp:    98.9 F (37.2 C)  TempSrc:    Oral  Resp:    18  Height:      Weight: 93.9 kg (207 lb 0.2 oz)     SpO2:    98%    Intake/Output Summary (Last 24 hours) at 07/29/15 1722 Last data filed at 07/29/15 1300  Gross per 24 hour  Intake    480 ml  Output      0 ml  Net    480 ml   Filed Weights   07/27/15 0500 07/28/15 0524 07/29/15 0700  Weight: 91.8 kg (202 lb 6.1 oz) 92.3 kg (203 lb 7.8 oz) 93.9 kg (207 lb 0.2 oz)   Examination:  General exam: Appears calm and comfortable  Respiratory system: Crackles at bases. Respiratory effort normal. Cardiovascular system: S1 & S2 heard, RRR. Gastrointestinal system: Abdomen is nondistended, soft and nontender. No organomegaly or masses felt. Normal bowel sounds heard. Central nervous system: Alert and oriented. No focal neurological deficits. Extremities: Symmetric 5 x 5 power. Skin: No rashes, lesions or ulcers Psychiatry: Judgement and insight appear normal. Mood & affect appropriate.   Data Reviewed: I have personally reviewed following labs and imaging studies  CBC:  Recent Labs Lab 07/23/15 1629  WBC 7.4  NEUTROABS 5.6  HGB 10.3*  HCT 34.6*  MCV 89.9  PLT 132   Basic Metabolic Panel:  Recent Labs Lab 07/25/15 0624 07/26/15 0627 07/27/15 0706 07/28/15 0538 07/29/15 0530  NA 139 138 139 137 135  K 4.2 4.2 4.0 4.1 4.2  CL 96* 95* 94* 91* 90*  CO2 35* 34* 36* 37* 33*  GLUCOSE 76 96 62* 73 101*  BUN 44* 47* 45* 51* 67*  CREATININE 1.56* 1.63* 1.40* 1.68* 2.32*  CALCIUM 9.0 9.1 9.3 9.1 8.6*   Cardiac Enzymes:  Recent Labs Lab 07/23/15 1629 07/23/15 2045  TROPONINI 0.03* 0.03*   Urine analysis:    Component Value Date/Time   COLORURINE YELLOW 10/10/2014 [place] 10/10/2014 2345   LABSPEC 1.010 10/10/2014 2345   PHURINE 6.0 10/10/2014 2345   GLUCOSEU NEGATIVE 10/10/2014 2345   HGBUR TRACE* 10/10/2014 [place] 10/10/2014 2345   KETONESUR NEGATIVE 10/10/2014 2345   PROTEINUR 30* 10/10/2014 2345   UROBILINOGEN 0.2 10/10/2014 2345   NITRITE NEGATIVE 10/10/2014 2345   LEUKOCYTESUR NEGATIVE 10/10/2014 2345   Sepsis Labs: _0 (procalcitonin:4,lacticidven:4)  )No results found for this or any previous visit (from the past 240 hour(s)).   Radiology Studies: No results found.  Scheduled Meds: . amLODipine  10 mg Oral Daily  . aspirin EC  81 mg Oral Daily  . atorvastatin  40 mg Oral q1800  . citalopram  40 mg Oral Daily  . enoxaparin (LOVENOX) injection  30 mg Subcutaneous Q24H  . gabapentin  300 mg Oral Daily  . gabapentin  600 mg Oral QHS  . hydrALAZINE  25 mg Oral Q8H  . insulin aspart  0-15 Units Subcutaneous TID WC  . insulin aspart  0-5 Units Subcutaneous QHS  . insulin aspart  3 Units Subcutaneous TID WC  . insulin detemir  7 Units Subcutaneous QHS  . isosorbide mononitrate  30 mg Oral QPM  . lisinopril  10 mg Oral Daily  . multivitamin with minerals  1 tablet Oral Daily  . sodium chloride flush  3 mL Intravenous Q12H  . [START ON 07/30/2015] torsemide  40 mg Oral Daily   Continuous Infusions:    LOS: 6 days      Marylu Lund, MD [facility] Pager [phone]  If 7PM-7AM, please contact night-coverage [email or web address] Password [email or web address] 07/29/2015, 5:22 PM

## 2015-07-30 LAB — BASIC METABOLIC PANEL
Anion gap: 9 (ref 5–15)
BUN: 81 mg/dL — ABNORMAL HIGH (ref 6–20)
CALCIUM: 8.6 mg/dL — AB (ref 8.9–10.3)
CHLORIDE: 90 mmol/L — AB (ref 101–111)
CO2: 34 mmol/L — ABNORMAL HIGH (ref 22–32)
CREATININE: 2.28 mg/dL — AB (ref 0.44–1.00)
GFR calc non Af Amer: 21 mL/min — ABNORMAL LOW (ref >60)
GFR, EST AFRICAN AMERICAN: 24 mL/min — AB (ref >60)
Glucose, Bld: 120 mg/dL — ABNORMAL HIGH (ref 65–99)
Potassium: 4.6 mmol/L (ref 3.5–5.1)
SODIUM: 133 mmol/L — AB (ref 135–145)

## 2015-07-30 LAB — GLUCOSE, CAPILLARY
GLUCOSE-CAPILLARY: 103 mg/dL — AB (ref 65–99)
GLUCOSE-CAPILLARY: 147 mg/dL — AB (ref 65–99)
Glucose-Capillary: 131 mg/dL — ABNORMAL HIGH (ref 65–99)

## 2015-07-30 MED ORDER — DOCUSATE SODIUM 100 MG PO CAPS
100.0000 mg | ORAL_CAPSULE | Freq: Two times a day (BID) | ORAL | Status: DC
  Administered 2015-07-30 – 2015-08-01 (×5): 100 mg via ORAL
  Filled 2015-07-30 (×5): qty 1

## 2015-07-30 NOTE — Progress Notes (Addendum)
Primary Cardiologist: Rozann Lesches MD  Cardiology Specific Problem List: Acute on Chronic Diastolic CHF Multivessel CAD Chronic right pleural effusion   Subjective:    Breathing better, but still feels abdominal fullness, feels as if "there is still fluid in there." No chest pain.  Objective:   Temp:  [98.2 F (36.8 C)-98.9 F (37.2 C)] 98.4 F (36.9 C) (07/21 0548) Pulse Rate:  [67-70] 70 (07/20 2032) Resp:  [18] 18 (07/20 2032) BP: (127-145)/(70-81) 127/81 mmHg (07/21 0548) SpO2:  [97 %-98 %] 97 % (07/21 0548) Weight:  [199 lb 14.4 oz (90.674 kg)] 199 lb 14.4 oz (90.674 kg) (07/21 0500) Last BM Date: 07/28/15  Filed Weights   07/28/15 0524 07/29/15 0700 07/30/15 0500  Weight: 203 lb 7.8 oz (92.3 kg) 207 lb 0.2 oz (93.9 kg) 199 lb 14.4 oz (90.674 kg)    Intake/Output Summary (Last 24 hours) at 07/30/15 0752 Last data filed at 07/29/15 1700  Gross per 24 hour  Intake    720 ml  Output    800 ml  Net    -80 ml    Telemetry: SR to SB.   Exam:  General: Chronically ill-appearing, no acute distress.  HEENT: Conjunctiva and lids normal, oropharynx clear.  Lungs: Bibasilar crackles, no wheezes. Pleurx cath on the right.   Cardiac: No elevated JVP or bruits. RRR, no gallop or rub.   Abdomen: Normoactive bowel sounds, nontender, mild distention.  Extremities: No pitting edema, some puffiness , distal pulses full.  Neuropsychiatric: Alert and oriented x3, affect appropriate.   Lab Results:  Basic Metabolic Panel:  Recent Labs Lab 07/28/15 0538 07/29/15 0530 07/30/15 0514  NA 137 135 133*  K 4.1 4.2 4.6  CL 91* 90* 90*  CO2 37* 33* 34*  GLUCOSE 73 101* 120*  BUN 51* 67* 81*  CREATININE 1.68* 2.32* 2.28*  CALCIUM 9.1 8.6* 8.6*     CBC:  Recent Labs Lab 07/23/15 1629  WBC 7.4  HGB 10.3*  HCT 34.6*  MCV 89.9  PLT 186    Cardiac Enzymes:  Recent Labs Lab 07/23/15 1629 07/23/15 2045  TROPONINI 0.03* 0.03*     Medications:     Scheduled Medications: . amLODipine  10 mg Oral Daily  . aspirin EC  81 mg Oral Daily  . atorvastatin  40 mg Oral q1800  . citalopram  40 mg Oral Daily  . enoxaparin (LOVENOX) injection  30 mg Subcutaneous Q24H  . gabapentin  300 mg Oral Daily  . gabapentin  600 mg Oral QHS  . hydrALAZINE  25 mg Oral Q8H  . insulin aspart  0-15 Units Subcutaneous TID WC  . insulin aspart  0-5 Units Subcutaneous QHS  . insulin aspart  3 Units Subcutaneous TID WC  . insulin detemir  7 Units Subcutaneous QHS  . isosorbide mononitrate  30 mg Oral QPM  . multivitamin with minerals  1 tablet Oral Daily  . sodium chloride flush  3 mL Intravenous Q12H  . torsemide  40 mg Oral Daily    PRN Medications: sodium chloride, acetaminophen, acetaminophen, albuterol, nitroGLYCERIN, ondansetron (ZOFRAN) IV, oxyCODONE, sodium chloride flush   Assessment and Plan:   1. Acute on Chronic Diastolic CHF: Weight is down from 230 lbs to 199 lbs. She is feeling better but still has fullness in her abdomen and feels there is still fluid there. She as been transitioned to oral torsemide from IV lasix, diuresed 800 cc last 24 hours. Creatinine 2.28. Will have her get up  in the bedside recliner today.   2. Chronic/recurrent right pleural effusion: Has pleurx catheter in place.  3. Multivessel CAD: She is without complaint of chest pain.  4. Bradycardia: Off of coreg now. Telemetry HR 40's to 60's.  [name] [name]. [name] [name] [place]  07/30/2015, 7:52 AM   Attending note:  Patient seen and examined. Modified above note by [name]. [name] [name] feels somewhat better. Her weight is down to 199 pounds. She has been changed from IV Lasix to oral Demadex. Renal insufficiency is stable with creatinine 2.3. Recommend mobilization, may need PT input. Continue aspirin, Norvasc, Lipitor, hydralazine, Imdur, and Demadex at 40 mg daily for now. I think that she will probably benefit from evaluation in the heart failure clinic given  difficulty optimizing volume status with fluctuating renal insufficiency and chronic right pleural effusion. She can be scheduled to see me after discharge, and we can talk about further arrangements for the CHF clinic from there.  Satira Sark, M.D., F.A.C.C.

## 2015-07-30 NOTE — Progress Notes (Signed)
PROGRESS NOTE    [name] [name]  RUE:[id number] DOB: 02/17/1945 DOA: 07/23/2015 PCP: Glenda Chroman, MD Outpatient Specialists:  Cardiology; Dr Gwenlyn Found  Cardiology; Dr Fletcher Anon  Cardiothoracic surgery; Dr. Kerby Less  Vascular surgery; Dr. Oneida Alar   Brief Narrative:  55 yof with a hx of CHF, Chronic resp failure on 2L home O2 at night, DM, HTn, CAD, presented with complaints of worsening edema in her legs and pannus with associated SOB. While in the ED, given lasix and admitted for acute decompensation of diastolic CHF. She was also noted initially to have an AKI on CKD 3. She is currently being diuresed with intravenous lasix. Cardiology following. Discharge home once volume status improved   Assessment & Plan:   Principal Problem:   Acute on chronic diastolic congestive heart failure (HCC) Active Problems:   Hypertension, uncontrolled   PAD (peripheral artery disease) (HCC)   Carotid artery disease (HCC)   Hx of CABG 2012   Diabetes mellitus type II, uncontrolled (HCC)   CHF (congestive heart failure) (HCC)   Recurrent pleural effusion on right   AKI (acute kidney injury) ([place])   CKD (chronic kidney disease) stage 3, GFR 30-59 ml/min   Chronic kidney disease (CKD)   Acute on chronic congestive heart failure ([place])  1. Acute on chronic diastolic CHF. Beta blockers held due to bradycardia. ECHO shows normal systolic function with EF of 55-60% with results as below. Pt reports feeling better today. Diuretics changed to PO per Cardiology given worsening renal function. Renal function stable overnight  2. Acute on chronic resp failure with hypoxia. Related to CHF. Chronically on 2L of oxygen. Will try and wean down to baseline oxygen requirement 3. AKI on CKD stage 3. Creatinine now worsening with IV diuretics. Changed to PO today. Monitor renal function 4. Chronic bilateral hip pain. Pain meds PRN. 5. HTN. Continue outpatient regimen 6. PAD. Continue on plavix 7. CAD. Continue outpatient  regimen. 8. DM type 2. Blood sugars borderline low. Will stop Amaryl per Diabetic Coordinator recs.  9. Hx of CABG 2012. Stable  DVT prophylaxis: Lovenox Code Status: DNR Family Communication: discussed with patient. No family present at bedside. Disposition Plan: Possible d/c tomorrow    Consultants:   Cardiology  Procedures:   ECHO Study Conclusions  - Left ventricle: The cavity size was normal. There was moderate  concentric hypertrophy. Systolic function was normal. The  estimated ejection fraction was in the range of 55% to 60%. Wall  motion was normal; there were no regional wall motion  abnormalities. Features are consistent with a pseudonormal left  ventricular filling pattern, with concomitant abnormal relaxation  and increased filling pressure (grade 2 diastolic dysfunction).  Doppler parameters are consistent with elevated ventricular  end-diastolic filling pressure. - Ventricular septum: The contour showed diastolic flattening and  systolic flattening. - Aortic valve: Trileaflet; normal thickness leaflets. There was  trivial regurgitation. - Aortic root: The aortic root was normal in size. - Ascending aorta: The ascending aorta was normal in size. - Mitral valve: Structurally normal valve. There was mild  regurgitation. - Left atrium: The atrium was moderately dilated. - Right ventricle: Systolic function was normal. - Right atrium: The atrium was normal in size. - Tricuspid valve: There was mild regurgitation. - Pulmonic valve: There was trivial regurgitation. - Pulmonary arteries: Systolic pressure was moderately increased.  PA peak pressure: 52 mm Hg (S). - Inferior vena cava: The vessel was normal in size. - Pericardium, extracardiac: There was no pericardial effusion.  Antimicrobials:  none    Subjective: No complaints today. Reports very good results with SMOG enema given yesterday  Objective: Filed Vitals:   07/29/15 2032  07/30/15 0500 07/30/15 0548 07/30/15 1623  BP: 132/70  127/81 124/34  Pulse: 70   49  Temp: 98.2 F (36.8 C)  98.4 F (36.9 C) 98.7 F (37.1 C)  TempSrc: Oral  Oral Oral  Resp: 18   16  Height:      Weight:  90.674 kg (199 lb 14.4 oz)    SpO2: 97%  97% 95%    Intake/Output Summary (Last 24 hours) at 07/30/15 1648 Last data filed at 07/29/15 1700  Gross per 24 hour  Intake    240 ml  Output    250 ml  Net    -10 ml   Filed Weights   07/28/15 0524 07/29/15 0700 07/30/15 0500  Weight: 92.3 kg (203 lb 7.8 oz) 93.9 kg (207 lb 0.2 oz) 90.674 kg (199 lb 14.4 oz)   Examination:  General exam: Appears calm and comfortable. Laying in bed Respiratory system: Crackles at bases. Respiratory effort normal. Cardiovascular system: S1 & S2 heard, RRR. Gastrointestinal system: Abdomen is nondistended, soft and nontender. No organomegaly or masses felt. Normal bowel sounds heard. Central nervous system: Alert and oriented. No focal neurological deficits. Extremities: Symmetric 5 x 5 power. Skin: No rashes, lesions or ulcers Psychiatry: Judgement and insight appear normal. Mood & affect appropriate.   Data Reviewed: I have personally reviewed following labs and imaging studies  CBC: No results for input(s): WBC, NEUTROABS, HGB, HCT, MCV, PLT in the last 168 hours. Basic Metabolic Panel:  Recent Labs Lab 07/26/15 0627 07/27/15 0706 07/28/15 0538 07/29/15 0530 07/30/15 0514  NA 138 139 137 135 133*  K 4.2 4.0 4.1 4.2 4.6  CL 95* 94* 91* 90* 90*  CO2 34* 36* 37* 33* 34*  GLUCOSE 96 62* 73 101* 120*  BUN 47* 45* 51* 67* 81*  CREATININE 1.63* 1.40* 1.68* 2.32* 2.28*  CALCIUM 9.1 9.3 9.1 8.6* 8.6*   Cardiac Enzymes:  Recent Labs Lab 07/23/15 2045  TROPONINI 0.03*   Urine analysis:    Component Value Date/Time   COLORURINE YELLOW 10/10/2014 [place] 10/10/2014 2345   LABSPEC 1.010 10/10/2014 2345   PHURINE 6.0 10/10/2014 2345   GLUCOSEU NEGATIVE 10/10/2014  2345   HGBUR TRACE* 10/10/2014 2345   BILIRUBINUR NEGATIVE 10/10/2014 2345   [place] 10/10/2014 2345   PROTEINUR 30* 10/10/2014 2345   UROBILINOGEN 0.2 10/10/2014 2345   NITRITE NEGATIVE 10/10/2014 2345   LEUKOCYTESUR NEGATIVE 10/10/2014 2345   Sepsis Labs: _0 (procalcitonin:4,lacticidven:4)  )No results found for this or any previous visit (from the past 240 hour(s)).   Radiology Studies: No results found.  Scheduled Meds: . amLODipine  10 mg Oral Daily  . aspirin EC  81 mg Oral Daily  . atorvastatin  40 mg Oral q1800  . citalopram  40 mg Oral Daily  . docusate sodium  100 mg Oral BID  . enoxaparin (LOVENOX) injection  30 mg Subcutaneous Q24H  . gabapentin  300 mg Oral Daily  . gabapentin  600 mg Oral QHS  . hydrALAZINE  25 mg Oral Q8H  . insulin aspart  0-15 Units Subcutaneous TID WC  . insulin aspart  0-5 Units Subcutaneous QHS  . insulin aspart  3 Units Subcutaneous TID WC  . insulin detemir  7 Units Subcutaneous QHS  . isosorbide mononitrate  30 mg Oral QPM  . multivitamin  with minerals  1 tablet Oral Daily  . sodium chloride flush  3 mL Intravenous Q12H  . torsemide  40 mg Oral Daily   Continuous Infusions:    LOS: 7 days      Marylu Lund, MD [facility] Pager [phone]  If 7PM-7AM, please contact night-coverage [email or web address] Password [facility] 07/30/2015, 4:48 PM

## 2015-07-30 NOTE — Care Management Note (Signed)
Case Management Note  Patient Details  Name: LAQUESHA HOLCOMB MRN: [id number] Date of Birth: 1945/02/07  Subjective/Objective:     Spoke with patient who is from home with son and daughter. Has pleurex catheter and stated her daughter  Manages this care very well. She stated that she ambulates with a walker and has home O2 Denies issues with medications or attending regular PCP visits. O2 is with [facility] and she would like home health services with same. Referral placed                Action/Plan: Home tomorrow with [facility].   Expected Discharge Date:                  Expected Discharge Plan:  [place]  In-House Referral:     Discharge planning Services  CM Consult  Post Acute Care Choice:    Choice offered to:  Patient  DME Arranged:    DME Agency:  [place] Arranged:  RN, PT [facility] Agency:  [place]  Status of Service:  Completed, signed off  If discussed at [place] of Stay Meetings, dates discussed:    Additional Comments:  Alvie Heidelberg, RN 07/30/2015, 5:40 PM

## 2015-07-30 NOTE — Plan of Care (Signed)
Problem: Food- and Nutrition-Related Knowledge Deficit (NB-1.1) Goal: Nutrition education Formal process to instruct or train a patient/client in a skill or to impart knowledge to help patients/clients voluntarily manage or modify food choices and eating behavior to maintain or improve health. Outcome: Adequate for Discharge Nutrition Education Note  RD consulted for nutrition education regarding acute CHF.  RD provided "Low Sodium Nutrition Therapy" handouts. Reviewed patient's dietary recall. Provided examples on ways to decrease sodium intake in diet. Discouraged intake of processed foods and use of salt shaker. Encouraged fresh fruits and vegetables as well as whole grain sources of carbohydrates to maximize fiber intake.   RD discussed why it is important for patient to adhere to diet recommendations, and emphasized the role of fluids, foods to avoid, and importance of weighing self daily. Teach back method used.  Expect good compliance.  Body mass index is 32.28 kg/(m^2). Pt meets criteria for obesity class I  based on current BMI.  Current diet order is Heart Healthy, patient is consuming approximately >50% of meals at this time. Labs and medications reviewed. No further nutrition interventions warranted at this time. RD contact information provided. If additional nutrition issues arise, please re-consult RD.   Colman Cater MS,RD,CSG,LDN Office: [phone] Pager: [phone]

## 2015-07-31 DIAGNOSIS — I10 Essential (primary) hypertension: Secondary | ICD-10-CM

## 2015-07-31 LAB — GLUCOSE, CAPILLARY
GLUCOSE-CAPILLARY: 164 mg/dL — AB (ref 65–99)
Glucose-Capillary: 99 mg/dL (ref 65–99)
Glucose-Capillary: 137 mg/dL — ABNORMAL HIGH (ref 65–99)
Glucose-Capillary: 125 mg/dL — ABNORMAL HIGH (ref 65–99)

## 2015-07-31 LAB — BASIC METABOLIC PANEL
ANION GAP: 10 (ref 5–15)
BUN: 86 mg/dL — AB (ref 6–20)
CO2: 32 mmol/L (ref 22–32)
CREATININE: 2.1 mg/dL — AB (ref 0.44–1.00)
Calcium: 8.5 mg/dL — ABNORMAL LOW (ref 8.9–10.3)
Chloride: 90 mmol/L — ABNORMAL LOW (ref 101–111)
GFR calc non Af Amer: 23 mL/min — ABNORMAL LOW (ref >60)
GFR, EST AFRICAN AMERICAN: 27 mL/min — AB (ref >60)
GLUCOSE: 88 mg/dL (ref 65–99)
POTASSIUM: 4.2 mmol/L (ref 3.5–5.1)
Sodium: 132 mmol/L — ABNORMAL LOW (ref 135–145)

## 2015-07-31 LAB — MAGNESIUM: MAGNESIUM: 2.9 mg/dL — AB (ref 1.7–2.4)

## 2015-07-31 NOTE — Progress Notes (Signed)
PROGRESS NOTE    [name] [name]  BDZ:[id number] DOB: 10-16-1945 DOA: 07/23/2015 PCP: Glenda Chroman, MD Outpatient Specialists:  Cardiology; Dr Gwenlyn Found  Cardiology; Dr Fletcher Anon  Cardiothoracic surgery; Dr. Kerby Less  Vascular surgery; Dr. Oneida Alar   Brief Narrative:  36 yof with a hx of CHF, Chronic resp failure on 2L home O2 at night, DM, HTn, CAD, presented with complaints of worsening edema in her legs and pannus with associated SOB. While in the ED, given lasix and admitted for acute decompensation of diastolic CHF. She was also noted initially to have an AKI on CKD 3. She is currently being diuresed with intravenous lasix. Cardiology following. Discharge home once volume status improved   Assessment & Plan:   Principal Problem:   Acute on chronic diastolic congestive heart failure (HCC) Active Problems:   Hypertension, uncontrolled   PAD (peripheral artery disease) (HCC)   Carotid artery disease (HCC)   Hx of CABG 2012   Diabetes mellitus type II, uncontrolled (HCC)   CHF (congestive heart failure) (HCC)   Recurrent pleural effusion on right   AKI (acute kidney injury) ([place])   CKD (chronic kidney disease) stage 3, GFR 30-59 ml/min   Chronic kidney disease (CKD)   Acute on chronic congestive heart failure ([place])  1. Acute on chronic diastolic CHF. Beta blockers held due to bradycardia. ECHO shows normal systolic function with EF of 55-60% with results as below. Pt reports feeling overall better.. Diuretics were changed to PO per Cardiology given worsening renal function. Renal function is slowly improving. 2. Acute on chronic resp failure with hypoxia. Related to CHF. Chronically on 2L of oxygen. Patient successfully weaned to 2 L nasal cannula, however noted to be hypoxic to the 80s still. We'll continue to diuresis as tolerated 3. AKI on CKD stage 3. Creatinine now showing improvement. Continue to monitor renal function 4. Chronic bilateral hip pain. Pain meds PRN. 5. HTN. Continue  outpatient regimen 6. PAD. Continue on plavix 7. CAD. Continue outpatient regimen. 8. DM type 2. Blood sugars borderline low. Suspect related to renal function that is worsened over baseline. Stopped Amaryl per Diabetic Coordinator recs.  9. Hx of CABG 2012. Stable  DVT prophylaxis: Lovenox Code Status: DNR Family Communication: discussed with patient. No family present at bedside. Disposition Plan: Possible d/c home in 24-48 hours    Consultants:   Cardiology  Procedures:   ECHO Study Conclusions  - Left ventricle: The cavity size was normal. There was moderate  concentric hypertrophy. Systolic function was normal. The  estimated ejection fraction was in the range of 55% to 60%. Wall  motion was normal; there were no regional wall motion  abnormalities. Features are consistent with a pseudonormal left  ventricular filling pattern, with concomitant abnormal relaxation  and increased filling pressure (grade 2 diastolic dysfunction).  Doppler parameters are consistent with elevated ventricular  end-diastolic filling pressure. - Ventricular septum: The contour showed diastolic flattening and  systolic flattening. - Aortic valve: Trileaflet; normal thickness leaflets. There was  trivial regurgitation. - Aortic root: The aortic root was normal in size. - Ascending aorta: The ascending aorta was normal in size. - Mitral valve: Structurally normal valve. There was mild  regurgitation. - Left atrium: The atrium was moderately dilated. - Right ventricle: Systolic function was normal. - Right atrium: The atrium was normal in size. - Tricuspid valve: There was mild regurgitation. - Pulmonic valve: There was trivial regurgitation. - Pulmonary arteries: Systolic pressure was moderately increased.  PA peak pressure: 52  mm Hg (S). - Inferior vena cava: The vessel was normal in size. - Pericardium, extracardiac: There was no pericardial effusion.  Antimicrobials:  none      Subjective: Overall feels better today. Patient denies chest pain or abdominal pain or palpitations  Objective: Filed Vitals:   07/31/15 1318 07/31/15 1349 07/31/15 1440 07/31/15 1755  BP: 140/38     Pulse: 50     Temp: 98.5 F (36.9 C)     TempSrc: Oral     Resp: 18     Height:      Weight:      SpO2: 91% 89% 94% 93%    Intake/Output Summary (Last 24 hours) at 07/31/15 1818 Last data filed at 07/31/15 1809  Gross per 24 hour  Intake    720 ml  Output      0 ml  Net    720 ml   Filed Weights   07/29/15 0700 07/30/15 0500 07/31/15 0607  Weight: 93.9 kg (207 lb 0.2 oz) 90.674 kg (199 lb 14.4 oz) 96.5 kg (212 lb 11.9 oz)   Examination:  General exam: Appears calm and comfortable. Laying in bed, Awake conversant Respiratory system: Crackles at bases. Respiratory effort normal. Cardiovascular system: S1 & S2 heard, RRR. Gastrointestinal system: Abdomen is nondistended, soft and nontender. No organomegaly or masses felt. Normal bowel sounds heard. Central nervous system: Alert and oriented. No focal neurological deficits. Extremities: Symmetric 5 x 5 power. Skin: No rashes, lesions Psychiatry: Judgement and insight appear normal. Mood & affect appropriate.   Data Reviewed: I have personally reviewed following labs and imaging studies  CBC: No results for input(s): WBC, NEUTROABS, HGB, HCT, MCV, PLT in the last 168 hours. Basic Metabolic Panel:  Recent Labs Lab 07/27/15 0706 07/28/15 0538 07/29/15 0530 07/30/15 0514 07/31/15 0500  NA 139 137 135 133* 132*  K 4.0 4.1 4.2 4.6 4.2  CL 94* 91* 90* 90* 90*  CO2 36* 37* 33* 34* 32  GLUCOSE 62* 73 101* 120* 88  BUN 45* 51* 67* 81* 86*  CREATININE 1.40* 1.68* 2.32* 2.28* 2.10*  CALCIUM 9.3 9.1 8.6* 8.6* 8.5*  MG  --   --   --   --  2.9*   Cardiac Enzymes: No results for input(s): CKTOTAL, CKMB, CKMBINDEX, TROPONINI in the last 168 hours. Urine analysis:    Component Value Date/Time   COLORURINE YELLOW  10/10/2014 [place] 10/10/2014 2345   LABSPEC 1.010 10/10/2014 2345   PHURINE 6.0 10/10/2014 2345   GLUCOSEU NEGATIVE 10/10/2014 2345   HGBUR TRACE* 10/10/2014 2345   BILIRUBINUR NEGATIVE 10/10/2014 2345   KETONESUR NEGATIVE 10/10/2014 2345   PROTEINUR 30* 10/10/2014 2345   UROBILINOGEN 0.2 10/10/2014 2345   NITRITE NEGATIVE 10/10/2014 2345   LEUKOCYTESUR NEGATIVE 10/10/2014 2345   Sepsis Labs: _0 (procalcitonin:4,lacticidven:4)  )No results found for this or any previous visit (from the past 240 hour(s)).   Radiology Studies: No results found.  Scheduled Meds: . amLODipine  10 mg Oral Daily  . aspirin EC  81 mg Oral Daily  . atorvastatin  40 mg Oral q1800  . citalopram  40 mg Oral Daily  . docusate sodium  100 mg Oral BID  . enoxaparin (LOVENOX) injection  30 mg Subcutaneous Q24H  . gabapentin  300 mg Oral Daily  . gabapentin  600 mg Oral QHS  . hydrALAZINE  25 mg Oral Q8H  . insulin aspart  0-15 Units Subcutaneous TID WC  . insulin aspart  0-5  Units Subcutaneous QHS  . insulin aspart  3 Units Subcutaneous TID WC  . insulin detemir  7 Units Subcutaneous QHS  . isosorbide mononitrate  30 mg Oral QPM  . multivitamin with minerals  1 tablet Oral Daily  . sodium chloride flush  3 mL Intravenous Q12H  . torsemide  40 mg Oral Daily   Continuous Infusions:    LOS: 8 days      Marylu Lund, MD [facility] Pager [phone]  If 7PM-7AM, please contact night-coverage [email or web address] Password [email or web address] 07/31/2015, 6:18 PM

## 2015-08-01 DIAGNOSIS — I739 Peripheral vascular disease, unspecified: Secondary | ICD-10-CM

## 2015-08-01 LAB — GLUCOSE, CAPILLARY
Glucose-Capillary: 112 mg/dL — ABNORMAL HIGH (ref 65–99)
Glucose-Capillary: 178 mg/dL — ABNORMAL HIGH (ref 65–99)

## 2015-08-01 LAB — BASIC METABOLIC PANEL
ANION GAP: 7 (ref 5–15)
BUN: 99 mg/dL — AB (ref 6–20)
CALCIUM: 8.6 mg/dL — AB (ref 8.9–10.3)
CO2: 35 mmol/L — AB (ref 22–32)
Chloride: 92 mmol/L — ABNORMAL LOW (ref 101–111)
Creatinine, Ser: 2.04 mg/dL — ABNORMAL HIGH (ref 0.44–1.00)
GFR calc Af Amer: 28 mL/min — ABNORMAL LOW (ref >60)
GFR, EST NON AFRICAN AMERICAN: 24 mL/min — AB (ref >60)
GLUCOSE: 97 mg/dL (ref 65–99)
Potassium: 4.2 mmol/L (ref 3.5–5.1)
Sodium: 134 mmol/L — ABNORMAL LOW (ref 135–145)

## 2015-08-01 MED ORDER — TORSEMIDE 20 MG PO TABS: 40.0000 mg | ORAL_TABLET | Freq: Every day | ORAL | 0 refills | Status: DC

## 2015-08-01 NOTE — Care Management Note (Signed)
Case Management Note  Patient Details  Name: [name] [name] MRN: [id number] Date of Birth: 05/20/1945  Subjective/Objective:                    Action/Plan:   Expected Discharge Date:   08/01/15               Expected Discharge Plan:  [name]  In-House Referral:     Discharge planning Services  CM Consult  Post Acute Care Choice:    Choice offered to:  Patient  DME Arranged:    DME Agency:  [place] Arranged:  RN, PT, Nurse's Aide [place] Agency:  [place]  Status of Service:  Completed, signed off  If discussed at [place] of Stay Meetings, dates discussed:    Additional Comments:  [name], RN 08/01/2015, 11:40 AM

## 2015-08-01 NOTE — Progress Notes (Signed)
CM faxed information to [facility] for review for [facility] services.

## 2015-08-01 NOTE — Discharge Summary (Signed)
Physician Discharge Summary  [name] [name] AJO:[id number] DOB: 1946-01-10 DOA: 07/23/2015  PCP: Glenda Chroman, MD  Admit date: 07/23/2015 Discharge date: 08/01/2015  Admitted From: Home Disposition: Home  Recommendations for Outpatient Follow-up:  1. Follow up with PCP in 1-2 weeks 2. Please obtain BMP/CBC in one week 3. Please follow up on the following pending results:  Home Health:yes Equipment/Devices:Home O2  Discharge Condition:Stable CODE STATUS:DNR Diet recommendation: Diabetic, heart healthy  Brief/Interim Summary: 65 yof with a hx of CHF, Chronic resp failure on 2L home O2 at night, DM, HTn, CAD, presented with complaints of worsening edema in her legs and pannus with associated SOB. While in the ED, given lasix and admitted for acute decompensation of diastolic CHF. She was also noted initially to have an AKI on CKD 3. She is currently being diuresed with intravenous lasix. Cardiology following. Discharge home once volume status improved  1. Acute on chronic diastolic CHF. Beta blockers were held due to bradycardia. 2-D echocardiogram shows normal systolic function with EF of 55-60% with results as below. Pt reports feeling overall better and is questioning about going home. Diuretics were changed to PO per Cardiology given worsening renal function. Renal function is slowly improving. Patient presently on baseline O2 requirements. Patient to follow-up closely with cardiology on discharge 2. Acute on chronic resp failure with hypoxia. Related to CHF. Chronically on 2L of oxygen. Patient successfully weaned to 2 L nasal cannula. 3. AKI on CKD stage 3. Creatinine now showing improvement. Continue to monitor renal function 4. Chronic bilateral hip pain. Pain meds PRN. 5. HTN. Continue outpatient regimen 6. PAD. Continue on plavix 7. CAD. Continue outpatient regimen. 8. DM type 2. Blood sugars borderline low this admission. We'll hold Amaryl. Continue insulin regimen. Advised  patient to follow-up closely on discharge 9. Hx of CABG 2012. Stable  Discharge Diagnoses:  Principal Problem:   Acute on chronic diastolic congestive heart failure (HCC) Active Problems:   Hypertension, uncontrolled   PAD (peripheral artery disease) ([place])   Carotid artery disease (HCC)   Hx of CABG 2012   Diabetes mellitus type II, uncontrolled (HCC)   CHF (congestive heart failure) (HCC)   Recurrent pleural effusion on right   AKI (acute kidney injury) ([place])   CKD (chronic kidney disease) stage 3, GFR 30-59 ml/min   Chronic kidney disease (CKD)   Acute on chronic congestive heart failure ([place])       Medication List    STOP taking these medications   carvedilol 3.125 MG tablet Commonly known as:  COREG   glimepiride 2 MG tablet Commonly known as:  AMARYL   lisinopril 10 MG tablet Commonly known as:  PRINIVIL,ZESTRIL   metolazone 2.5 MG tablet Commonly known as:  ZAROXOLYN     TAKE these medications   acetaminophen 500 MG tablet Commonly known as:  TYLENOL Take 1,000 mg by mouth every 6 (six) hours as needed for moderate pain.   albuterol 108 (90 Base) MCG/ACT inhaler Commonly known as:  PROVENTIL HFA;VENTOLIN HFA Inhale 2 puffs into the lungs daily as needed for wheezing or shortness of breath.   amLODipine 10 MG tablet Commonly known as:  NORVASC Take 1 tablet (10 mg total) by mouth daily.   aspirin 81 MG EC tablet Take 81 mg by mouth daily.   atorvastatin 40 MG tablet Commonly known as:  LIPITOR Take 40 mg by mouth daily at 6 PM.   citalopram 40 MG tablet Commonly known as:  CELEXA Take 1 tablet by  mouth daily.   clopidogrel 75 MG tablet Commonly known as:  PLAVIX TAKE ONE TABLET BY MOUTH ONCE DAILY WITH BREAKFAST   gabapentin 300 MG capsule Commonly known as:  NEURONTIN Take one tab (358m) by mouth every morning & 2 tabs (6033m every evening at bedtime   HAIR SKIN NAILS PO Take 1 tablet by mouth daily.   hydrALAZINE 25 MG  tablet Commonly known as:  APRESOLINE Take 25 mg by mouth 3 (three) times daily.   HYDROcodone-acetaminophen 5-325 MG tablet Commonly known as:  NORCO/VICODIN Take 1 tablet by mouth every 6 (six) hours as needed for moderate pain.   insulin aspart 100 UNIT/ML injection Commonly known as:  novoLOG Inject 3 Units into the skin 3 (three) times daily with meals.   insulin detemir 100 UNIT/ML injection Commonly known as:  LEVEMIR Inject 0.07 mLs (7 Units total) into the skin at bedtime.   isosorbide mononitrate 30 MG 24 hr tablet Commonly known as:  IMDUR Take 30 mg by mouth every evening.   multivitamin with minerals Tabs tablet Take 1 tablet by mouth daily.   nitroGLYCERIN 0.4 MG SL tablet Commonly known as:  NITROSTAT Place 1 tablet (0.4 mg total) under the tongue every 5 (five) minutes as needed for chest pain. Up to 3 doses. If no relief after 3rd dose, proceed to the ED for an evaluation   PROBIOTIC DAILY PO Take 0.5 tablets by mouth 2 (two) times daily.   STOOL SOFTENER PO Take 100 mg by mouth daily as needed (for constipation).   torsemide 20 MG tablet Commonly known as:  DEMADEX Take 2 tablets (40 mg total) by mouth daily. What changed:  when to take this   umeclidinium-vilanterol 62.5-25 MCG/INH Aepb Commonly known as:  ANORO ELLIPTA Inhale 1 application into the lungs daily. As needed      Follow-up Information    DhGlenda ChromanMD Follow up in 1 week(s).   Specialty:  Internal Medicine Contact information: [phone]      SaRozann LeschesMD. Schedule an appointment as soon as possible for a visit in 1 week(s).   Specialty:  Cardiology Contact information: [phone] [phone]        Allergies  Allergen Reactions  . Other Shortness Of Breath, Rash and Other (See Comments)    All berries   . Strawberry Extract Hives, Shortness Of Breath and Rash  . Sulfa Antibiotics Swelling     Bodily Swelling  . Tape Other (See Comments)    Tears skin.  Please use "paper" tape only.    Consultations:  Cardiology   Procedures/Studies: Dg Chest 2 View  Result Date: 07/23/2015 CLINICAL DATA:  Shortness of breath and CHF. EXAM: CHEST  2 VIEW COMPARISON:  10/16/2014 FINDINGS: AP and lateral views of the chest were obtained. The cardio pericardial silhouette is enlarged. There is pulmonary vascular congestion without overt pulmonary edema. Interstitial opacity at the bases suggests edema. Small right pleural effusion noted. Bones are diffusely demineralized. Patient is status post CABG. IMPRESSION: Cardiomegaly with vascular congestion and probable interstitial pulmonary edema. Small right pleural effusion. Electronically Signed   By: ErMisty Stanley.D.   On: 07/23/2015 19:06      Subjective: Patient asking about going home today  Discharge Exam: Vitals:   07/31/15 2222 08/01/15 0705  BP: (!) 133/37 (!) 147/43  Pulse: (!) 49 (!) 51  Resp: 16 20  Temp:  98 F (36.7 C) 98.3 F (36.8 C)   Vitals:   07/31/15 1755 07/31/15 2222 08/01/15 0705 08/01/15 1117  BP:  (!) 133/37 (!) 147/43   Pulse:  (!) 49 (!) 51   Resp:  16 20   Temp:  98 F (36.7 C) 98.3 F (36.8 C)   TempSrc:  Oral Oral   SpO2: 93% 92% 94% 90%  Weight:   92.9 kg (204 lb 12.9 oz)   Height:        General: Pt is alert, awake, not in acute distress Cardiovascular: RRR, S1/S2 +, no rubs, no gallops Respiratory: CTA bilaterally, no wheezing, no rhonchi Abdominal: Soft, NT, ND, bowel sounds + Extremities: no edema, no cyanosis    The results of significant diagnostics from this hospitalization (including imaging, microbiology, ancillary and laboratory) are listed below for reference.     Microbiology: No results found for this or any previous visit (from the past 240 hour(s)).   Labs: BNP (last 3 results)  Recent Labs  09/09/14 0245 10/10/14 2153 07/23/15 1629  BNP 436.8* 914.0* 654.0*    Basic Metabolic Panel:  Recent Labs Lab 07/28/15 0538 07/29/15 0530 07/30/15 0514 07/31/15 0500 08/01/15 0746  NA 137 135 133* 132* 134*  K 4.1 4.2 4.6 4.2 4.2  CL 91* 90* 90* 90* 92*  CO2 37* 33* 34* 32 35*  GLUCOSE 73 101* 120* 88 97  BUN 51* 67* 81* 86* 99*  CREATININE 1.68* 2.32* 2.28* 2.10* 2.04*  CALCIUM 9.1 8.6* 8.6* 8.5* 8.6*  MG  --   --   --  2.9*  --    Liver Function Tests: No results for input(s): AST, ALT, ALKPHOS, BILITOT, PROT, ALBUMIN in the last 168 hours. No results for input(s): LIPASE, AMYLASE in the last 168 hours. No results for input(s): AMMONIA in the last 168 hours. CBC: No results for input(s): WBC, NEUTROABS, HGB, HCT, MCV, PLT in the last 168 hours. Cardiac Enzymes: No results for input(s): CKTOTAL, CKMB, CKMBINDEX, TROPONINI in the last 168 hours. BNP: Invalid input(s): POCBNP CBG:  Recent Labs Lab 07/31/15 0759 07/31/15 1134 07/31/15 1641 07/31/15 2219 08/01/15 0730  GLUCAP 99 137* 125* 164* 112*   D-Dimer No results for input(s): DDIMER in the last 72 hours. Hgb A1c No results for input(s): HGBA1C in the last 72 hours. Lipid Profile No results for input(s): CHOL, HDL, LDLCALC, TRIG, CHOLHDL, LDLDIRECT in the last 72 hours. Thyroid function studies No results for input(s): TSH, T4TOTAL, T3FREE, THYROIDAB in the last 72 hours.  Invalid input(s): FREET3 Anemia work up No results for input(s): VITAMINB12, FOLATE, FERRITIN, TIBC, IRON, RETICCTPCT in the last 72 hours. Urinalysis    Component Value Date/Time   COLORURINE YELLOW 10/10/2014 [place] 10/10/2014 2345   LABSPEC 1.010 10/10/2014 2345   PHURINE 6.0 10/10/2014 2345   GLUCOSEU NEGATIVE 10/10/2014 2345   HGBUR TRACE (A) 10/10/2014 2345   BILIRUBINUR NEGATIVE 10/10/2014 2345   KETONESUR NEGATIVE 10/10/2014 2345   PROTEINUR 30 (A) 10/10/2014 2345   UROBILINOGEN 0.2 10/10/2014 2345   NITRITE NEGATIVE 10/10/2014 2345   LEUKOCYTESUR NEGATIVE 10/10/2014  2345   Sepsis Labs Invalid input(s): PROCALCITONIN,  WBC,  LACTICIDVEN Microbiology No results found for this or any previous visit (from the past 240 hour(s)).    CHIU, Orpah Melter, MD  [facility] 08/01/2015, 11:19 AM Pager   If 7PM-7AM, please contact night-coverage [email or web address] Password [email or web address]

## 2015-08-03 DIAGNOSIS — J9621 Acute and chronic respiratory failure with hypoxia: Secondary | ICD-10-CM | POA: Diagnosis not present

## 2015-08-03 DIAGNOSIS — I5033 Acute on chronic diastolic (congestive) heart failure: Secondary | ICD-10-CM | POA: Diagnosis not present

## 2015-08-03 DIAGNOSIS — N183 Chronic kidney disease, stage 3 (moderate): Secondary | ICD-10-CM | POA: Diagnosis not present

## 2015-08-03 DIAGNOSIS — E782 Mixed hyperlipidemia: Secondary | ICD-10-CM | POA: Diagnosis not present

## 2015-08-03 DIAGNOSIS — E1165 Type 2 diabetes mellitus with hyperglycemia: Secondary | ICD-10-CM | POA: Diagnosis not present

## 2015-08-03 DIAGNOSIS — I6529 Occlusion and stenosis of unspecified carotid artery: Secondary | ICD-10-CM | POA: Diagnosis not present

## 2015-08-03 DIAGNOSIS — E1122 Type 2 diabetes mellitus with diabetic chronic kidney disease: Secondary | ICD-10-CM | POA: Diagnosis not present

## 2015-08-03 DIAGNOSIS — Z9981 Dependence on supplemental oxygen: Secondary | ICD-10-CM | POA: Diagnosis not present

## 2015-08-03 DIAGNOSIS — I130 Hypertensive heart and chronic kidney disease with heart failure and stage 1 through stage 4 chronic kidney disease, or unspecified chronic kidney disease: Secondary | ICD-10-CM | POA: Diagnosis not present

## 2015-08-03 DIAGNOSIS — I2510 Atherosclerotic heart disease of native coronary artery without angina pectoris: Secondary | ICD-10-CM | POA: Diagnosis not present

## 2015-08-03 DIAGNOSIS — Z915 Personal history of self-harm: Secondary | ICD-10-CM | POA: Diagnosis not present

## 2015-08-03 DIAGNOSIS — Z951 Presence of aortocoronary bypass graft: Secondary | ICD-10-CM | POA: Diagnosis not present

## 2015-08-03 DIAGNOSIS — E1151 Type 2 diabetes mellitus with diabetic peripheral angiopathy without gangrene: Secondary | ICD-10-CM | POA: Diagnosis not present

## 2015-08-03 DIAGNOSIS — Z7902 Long term (current) use of antithrombotics/antiplatelets: Secondary | ICD-10-CM | POA: Diagnosis not present

## 2015-08-03 DIAGNOSIS — Z794 Long term (current) use of insulin: Secondary | ICD-10-CM | POA: Diagnosis not present

## 2015-08-03 DIAGNOSIS — Z8673 Personal history of transient ischemic attack (TIA), and cerebral infarction without residual deficits: Secondary | ICD-10-CM | POA: Diagnosis not present

## 2015-08-03 DIAGNOSIS — Z87891 Personal history of nicotine dependence: Secondary | ICD-10-CM | POA: Diagnosis not present

## 2015-08-04 DIAGNOSIS — I130 Hypertensive heart and chronic kidney disease with heart failure and stage 1 through stage 4 chronic kidney disease, or unspecified chronic kidney disease: Secondary | ICD-10-CM | POA: Diagnosis not present

## 2015-08-04 DIAGNOSIS — J9621 Acute and chronic respiratory failure with hypoxia: Secondary | ICD-10-CM | POA: Diagnosis not present

## 2015-08-04 DIAGNOSIS — J811 Chronic pulmonary edema: Secondary | ICD-10-CM | POA: Diagnosis not present

## 2015-08-04 DIAGNOSIS — E1122 Type 2 diabetes mellitus with diabetic chronic kidney disease: Secondary | ICD-10-CM | POA: Diagnosis not present

## 2015-08-04 DIAGNOSIS — I5033 Acute on chronic diastolic (congestive) heart failure: Secondary | ICD-10-CM | POA: Diagnosis not present

## 2015-08-04 DIAGNOSIS — E1165 Type 2 diabetes mellitus with hyperglycemia: Secondary | ICD-10-CM | POA: Diagnosis not present

## 2015-08-04 DIAGNOSIS — R0602 Shortness of breath: Secondary | ICD-10-CM | POA: Diagnosis not present

## 2015-08-04 DIAGNOSIS — N183 Chronic kidney disease, stage 3 (moderate): Secondary | ICD-10-CM | POA: Diagnosis not present

## 2015-08-05 DIAGNOSIS — J9621 Acute and chronic respiratory failure with hypoxia: Secondary | ICD-10-CM | POA: Diagnosis not present

## 2015-08-05 DIAGNOSIS — I5033 Acute on chronic diastolic (congestive) heart failure: Secondary | ICD-10-CM | POA: Diagnosis not present

## 2015-08-05 DIAGNOSIS — I130 Hypertensive heart and chronic kidney disease with heart failure and stage 1 through stage 4 chronic kidney disease, or unspecified chronic kidney disease: Secondary | ICD-10-CM | POA: Diagnosis not present

## 2015-08-05 DIAGNOSIS — N183 Chronic kidney disease, stage 3 (moderate): Secondary | ICD-10-CM | POA: Diagnosis not present

## 2015-08-05 DIAGNOSIS — E1122 Type 2 diabetes mellitus with diabetic chronic kidney disease: Secondary | ICD-10-CM | POA: Diagnosis not present

## 2015-08-05 DIAGNOSIS — E1165 Type 2 diabetes mellitus with hyperglycemia: Secondary | ICD-10-CM | POA: Diagnosis not present

## 2015-08-05 NOTE — Progress Notes (Signed)
Cardiology Office Note  Date: 08/06/2015   ID: [name] [name], DOB [birth year], MRN [id number]  PCP: Glenda Chroman, MD  Primary Cardiologist: Rozann Lesches, MD   Chief Complaint  Patient presents with  . Diastolic heart failure  . Hospitalization Follow-up    History of Present Illness: [name] [name] is a medically complex 70 y.o. female last seen in early July. She was recently admitted to [facility] with volume overload in the setting of acute on chronic diastolic heart failure. She was just discharged on July 23. She was noted to be bradycardic on admission and taken off of Coreg with improved heart rate in general. During the course of her stay she was also taken off of lisinopril due to progressive renal insufficiency. She was diuresed with IV Lasix and discharged on Demadex 40 mg daily.  She is here today with her daughter for a follow-up visit. Still weak, but generally feels better. Daughter states that she has been able to walk around some which was not the case before she went to the hospital. She has NYHA class III dyspnea, uses oxygen as well.  Cardiac history is outlined below. She has a Pleurx catheter in place on the right with recurrent right-sided pleural effusion. Since discharge home they have not had much drainage from this catheter, just 50 cc recently.  Recent follow-up echocardiogram is outlined below, LVEF 55-60% with grade 2 diastolic dysfunction, normal RV contraction, and pulmonary hypertension with PASP 52 mmHg.  Creatinine recently up to 2.3 and then down to 2.0. She remains on Demadex at 40 mg daily. States that her weight by home scales has been stable around 194 pounds since discharge. By our scales he is down nearly 14 pounds from early July.  Past Medical History:  Diagnosis Date  . Arthritis   . Carotid artery disease (HCC)    a. Bilateral CEA; 50-60% bilateral ICA stenosis, 11/12  . Cellulitis    a. Recurrent, bilateral legs  . CHF (congestive  heart failure) ([place])   . Chronic back pain   . Chronic diastolic heart failure ([place])   . Coronary atherosclerosis of native coronary artery    a. 11/2010 NSTEMI/CABG x 4: L-LAD; S-PL; S-OM; S-DX, by PVT.  [name] Critical lower limb ischemia   . Depression   . Essential hypertension, benign   . Herniated disc   . History of blood transfusion   . Ischemic cardiomyopathy    a. 11/2010 TEE: EF 40-45%.  . Migraine   . Mixed hyperlipidemia   . NSTEMI (non-ST elevated myocardial infarction) ([place]) 11/2010  . On home oxygen therapy   . PAD (peripheral artery disease) ([place])   . Pneumonia 2000's X 2  . Stroke ([place]) 08/2009  . Suicide attempt ([place]) 08/2002  . TIA (transient ischemic attack)   . TMJ syndrome   . Type 2 diabetes mellitus ([place])   . Venous insufficiency     Past Surgical History:  Procedure Laterality Date  . ABDOMINAL HYSTERECTOMY    . APPENDECTOMY    . CARDIAC CATHETERIZATION  11/2010  . CAROTID ENDARTERECTOMY Bilateral 2011   Bilateral - Dr. Kellie Simmering  . CHOLECYSTECTOMY    . COLONOSCOPY W/ BIOPSIES AND POLYPECTOMY    . CORONARY ANGIOPLASTY WITH STENT PLACEMENT  03/2013   "1"  . CORONARY ARTERY BYPASS GRAFT  11/24/2010   Procedure: CORONARY ARTERY BYPASS GRAFTING (CABG);  Surgeon: Tharon Aquas Adelene Idler, MD;  Location: [facility];  Service: Open Heart Surgery;  Laterality: N/A;  Coronary Artery Bypass Graft times four on pump utilizing left internal mammary artery and bilateral greater saphenous veins harvested endoscopically, transesophageal echocardiogram   . DEBRIDEMENT TOE Left    "nonhealing wound; 3rd digit  . DILATION AND CURETTAGE OF UTERUS    . ENDARTERECTOMY POPLITEAL Left 10/07/2013   Procedure: LEFT POPLITEAL ENDARTERECTOMY ;  Surgeon: Angelia Mould, MD;  Location: [facility];  Service: Vascular;  Laterality: Left;  . FEMORAL-POPLITEAL BYPASS GRAFT Left 10/07/2013   Procedure: LEFT FEMORAL-POPLITEAL ARTERY BYPASS GRAFT;  Surgeon: Angelia Mould, MD;  Location: [facility];   Service: Vascular;  Laterality: Left;  . FRACTURE SURGERY    . INTRAOPERATIVE ARTERIOGRAM Left 10/07/2013   Procedure: INTRA OPERATIVE ARTERIOGRAM LEFT LEG;  Surgeon: Angelia Mould, MD;  Location: [place] [facility];  Service: Vascular;  Laterality: Left;  . LEFT AND RIGHT HEART CATHETERIZATION WITH CORONARY/GRAFT ANGIOGRAM N/A 03/27/2013   Procedure: LEFT AND RIGHT HEART CATHETERIZATION WITH Beatrix Fetters;  Surgeon: Burnell Blanks, MD;  Location: [facility] CATH LAB;  Service: Cardiovascular;  Laterality: N/A;  . LOWER EXTREMITY ANGIOGRAM  09/08/2012; 10/06/2013   "found 100% blockage; unsuccessful attempt at crossing a chronic total occlusion of the left SFA in the setting of critical limb ischemia  . LOWER EXTREMITY ANGIOGRAM Bilateral 09/08/2013   Procedure: LOWER EXTREMITY ANGIOGRAM;  Surgeon: [name], MD;  Location: [facility] CATH LAB;  Service: Cardiovascular;  Laterality: Bilateral;  . TOE SURGERY Left    "put pin in 2nd toe"  . TUBAL LIGATION    . WRIST FRACTURE SURGERY Left    "grafted bone from hip to wrist"    Current Outpatient Prescriptions  Medication Sig Dispense Refill  . acetaminophen (TYLENOL) 500 MG tablet Take 1,000 mg by mouth every 6 (six) hours as needed for moderate pain.     [name] albuterol (PROVENTIL HFA;VENTOLIN HFA) 108 (90 BASE) MCG/ACT inhaler Inhale 2 puffs into the lungs daily as needed for wheezing or shortness of breath.     [name] amLODipine (NORVASC) 10 MG tablet Take 1 tablet (10 mg total) by mouth daily. 90 tablet 3  . aspirin 81 MG EC tablet Take 81 mg by mouth daily.    [name] atorvastatin (LIPITOR) 40 MG tablet Take 40 mg by mouth daily at 6 PM.    . cefUROXime (CEFTIN) 500 MG tablet Take 500 mg by mouth 2 (two) times daily with a meal.    . citalopram (CELEXA) 40 MG tablet Take 1 tablet by mouth daily.    . clopidogrel (PLAVIX) 75 MG tablet TAKE ONE TABLET BY MOUTH ONCE DAILY WITH BREAKFAST 30 tablet 6  . Docusate Calcium (STOOL SOFTENER PO) Take 100 mg by  mouth daily as needed (for constipation).     . gabapentin (NEURONTIN) 300 MG capsule Take one tab (333m) by mouth every morning & 2 tabs (6070m every evening at bedtime    . hydrALAZINE (APRESOLINE) 25 MG tablet Take 25 mg by mouth 3 (three) times daily.    . Marland KitchenYDROcodone-acetaminophen (NORCO/VICODIN) 5-325 MG tablet Take 1 tablet by mouth every 6 (six) hours as needed for moderate pain. 30 tablet 0  . insulin aspart (NOVOLOG) 100 UNIT/ML injection Inject 3 Units into the skin 3 (three) times daily with meals. 10 mL 11  . insulin detemir (LEVEMIR) 100 UNIT/ML injection Inject 0.07 mLs (7 Units total) into the skin at bedtime. 10 mL 11  . isosorbide mononitrate (IMDUR) 30 MG 24 hr tablet Take 30 mg by mouth every  evening.    . Multiple Vitamin (MULTIVITAMIN WITH MINERALS) TABS tablet Take 1 tablet by mouth daily.    . Multiple Vitamins-Minerals (HAIR SKIN NAILS PO) Take 1 tablet by mouth daily.    . nitroGLYCERIN (NITROSTAT) 0.4 MG SL tablet Place 1 tablet (0.4 mg total) under the tongue every 5 (five) minutes as needed for chest pain. Up to 3 doses. If no relief after 3rd dose, proceed to the ED for an evaluation 25 tablet 3  . Probiotic Product (PROBIOTIC DAILY PO) Take 0.5 tablets by mouth 2 (two) times daily.    [name] torsemide (DEMADEX) 20 MG tablet Take 2 tablets (40 mg total) by mouth daily. 30 tablet 0  . Umeclidinium-Vilanterol 62.5-25 MCG/INH AEPB Inhale 1 application into the lungs daily. As needed     No current facility-administered medications for this visit.    Allergies:  Other; Strawberry extract; Sulfa antibiotics; and Tape   Social History: The patient  reports that she quit smoking about 5 years ago. Her smoking use included Cigarettes. She started smoking about 59 years ago. She has a 150.00 pack-year smoking history. She has never used smokeless tobacco. She reports that she drinks alcohol. She reports that she uses drugs, including Marijuana.   ROS:  Please see the history of  present illness. Otherwise, complete review of systems is positive for chronic fatigue.  All other systems are reviewed and negative.   Physical Exam: VS:  BP 119/61   Pulse (!) 47   Ht _0  (1.676 m)   Wt 197 lb 12.8 oz (89.7 kg)   SpO2 94%   BMI 31.93 kg/m , BMI Body mass index is 31.93 kg/m.  Wt Readings from Last 3 Encounters:  08/06/15 197 lb 12.8 oz (89.7 kg)  08/01/15 204 lb 12.9 oz (92.9 kg)  07/12/15 212 lb 3.2 oz (96.3 kg)    Chronically ill-appearing woman, no distress. Wearing oxygen. HEENT: Conjunctiva and lids normal, oropharynx clear. Neck: Supple, no elevated JVP, bilateral CEA scars, no thyromegaly. Lungs: Scattered basilar rhonchi and decreased breath sounds at the right lower zone, Pleurx catheter dressed, nonlabored breathing at rest. Cardiac: Regular rate and rhythm, no S3, soft systolic murmur, no pericardial rub. Abdomen: Protuberant, bowel sounds present, no guarding or rebound. Extremities: Mild edema and stasis, distal pulses 1+. Skin: Warm and dry. Musculoskeletal: No kyphosis. Neuropsychiatric: Alert and oriented 3, affect appropriate.  ECG: I personally reviewed the tracing from 07/23/2015 which showed sinus bradycardia with poor anterior R-wave progression.  Recent Labwork: 10/12/2014: ALT 23; AST 24; TSH 2.210 07/23/2015: B Natriuretic Peptide 654.0; Hemoglobin 10.3; Platelets 186 07/31/2015: Magnesium 2.9 08/01/2015: BUN 99; Creatinine, Ser 2.04; Potassium 4.2; Sodium 134     Component Value Date/Time   CHOL 114 10/13/2010 0401   TRIG 96 10/13/2010 0401   HDL 31 (L) 10/13/2010 0401   CHOLHDL 3.7 10/13/2010 0401   VLDL 19 10/13/2010 0401   LDLCALC 64 10/13/2010 0401    Other Studies Reviewed Today:  Echocardiogram 07/24/2015: Study Conclusions  - Left ventricle: The cavity size was normal. There was moderate   concentric hypertrophy. Systolic function was normal. The   estimated ejection fraction was in the range of 55% to 60%. Wall    motion was normal; there were no regional wall motion   abnormalities. Features are consistent with a pseudonormal left   ventricular filling pattern, with concomitant abnormal relaxation   and increased filling pressure (grade 2 diastolic dysfunction).   Doppler parameters are consistent with elevated  ventricular   end-diastolic filling pressure. - Ventricular septum: The contour showed diastolic flattening and   systolic flattening. - Aortic valve: Trileaflet; normal thickness leaflets. There was   trivial regurgitation. - Aortic root: The aortic root was normal in size. - Ascending aorta: The ascending aorta was normal in size. - Mitral valve: Structurally normal valve. There was mild   regurgitation. - Left atrium: The atrium was moderately dilated. - Right ventricle: Systolic function was normal. - Right atrium: The atrium was normal in size. - Tricuspid valve: There was mild regurgitation. - Pulmonic valve: There was trivial regurgitation. - Pulmonary arteries: Systolic pressure was moderately increased.   PA peak pressure: 52 mm Hg (S). - Inferior vena cava: The vessel was normal in size. - Pericardium, extracardiac: There was no pericardial effusion.  Chest x-ray 07/23/2015: FINDINGS: AP and lateral views of the chest were obtained. The cardio pericardial silhouette is enlarged. There is pulmonary vascular congestion without overt pulmonary edema. Interstitial opacity at the bases suggests edema. Small right pleural effusion noted. Bones are diffusely demineralized. Patient is status post CABG.  IMPRESSION: Cardiomegaly with vascular congestion and probable interstitial pulmonary edema.  Small right pleural effusion.  Assessment and Plan:  1. Chronic diastolic heart failure with recurrent volume overload. Recent hospitalization resulted in good diuresis, now back on Demadex at 40 mg daily. Diuretic management has been limited by acute on chronic renal insufficiency.  She also has a right-sided Pleurx catheter in place with recurrent right pleural effusion. Point status is better with weight down in the 190s although she remains tenuous overall. I have recommended evaluation in the CHF clinic, possibly we can see her alternating with CHF clinic visits.  2. Recurrent right pleural effusion, Pleurx catheter placed in October 2016. She has been followed by Dr. Luan Pulling. Drainage has slowed down recently, would optimally prefer that this is removed if volume status can be managed adequately on medical therapy. Not certain whether pleurodesis would be needed.  3. Moderate pulmonary hypertension by recent echocardiogram, RV contraction normal. Right heart catheterization in September 2015 showed PA mean 24, wedge pressure 9.  4. Multivessel CAD status post CABG in 2012. No active angina symptoms at this time. Cardiac catheterization in September 2015 revealed patent bypass grafts with high-grade stenosis involving the SVG to diagonal which was managed with DES. She is currently maintained on aspirin and Plavix.  5. CKD stage 3 with acute on chronic exacerbations in the setting of diuretic use. Lisinopril recently discontinued.  6. Bradycardia, Coreg discontinued at recent hospital stay with heart rate down in the low 40s.  Current medicines were reviewed with the patient today.   Orders Placed This Encounter  Procedures  . Basic metabolic panel  . Ambulatory referral to Cardiology    Disposition: Follow-up with me after assessment in CHF clinic. BMET for next visit.  Signed, Satira Sark, MD, [facility] 08/06/2015 2:32 PM    [facility] [place], [place], [place] [address] Phone: [phone]; Fax: [phone]

## 2015-08-06 ENCOUNTER — Encounter: Payer: Self-pay | Admitting: Cardiology

## 2015-08-06 ENCOUNTER — Ambulatory Visit (INDEPENDENT_AMBULATORY_CARE_PROVIDER_SITE_OTHER): Payer: Medicare Other | Admitting: Cardiology

## 2015-08-06 VITALS — BP 119/61 | HR 47 | Ht 66.0 in | Wt 197.8 lb

## 2015-08-06 DIAGNOSIS — I130 Hypertensive heart and chronic kidney disease with heart failure and stage 1 through stage 4 chronic kidney disease, or unspecified chronic kidney disease: Secondary | ICD-10-CM | POA: Diagnosis not present

## 2015-08-06 DIAGNOSIS — R001 Bradycardia, unspecified: Secondary | ICD-10-CM

## 2015-08-06 DIAGNOSIS — N183 Chronic kidney disease, stage 3 (moderate): Secondary | ICD-10-CM | POA: Diagnosis not present

## 2015-08-06 DIAGNOSIS — I2510 Atherosclerotic heart disease of native coronary artery without angina pectoris: Secondary | ICD-10-CM | POA: Diagnosis not present

## 2015-08-06 DIAGNOSIS — J9621 Acute and chronic respiratory failure with hypoxia: Secondary | ICD-10-CM | POA: Diagnosis not present

## 2015-08-06 DIAGNOSIS — I5033 Acute on chronic diastolic (congestive) heart failure: Secondary | ICD-10-CM | POA: Diagnosis not present

## 2015-08-06 DIAGNOSIS — E1165 Type 2 diabetes mellitus with hyperglycemia: Secondary | ICD-10-CM | POA: Diagnosis not present

## 2015-08-06 DIAGNOSIS — I5032 Chronic diastolic (congestive) heart failure: Secondary | ICD-10-CM | POA: Diagnosis not present

## 2015-08-06 DIAGNOSIS — E1122 Type 2 diabetes mellitus with diabetic chronic kidney disease: Secondary | ICD-10-CM | POA: Diagnosis not present

## 2015-08-06 DIAGNOSIS — J90 Pleural effusion, not elsewhere classified: Secondary | ICD-10-CM | POA: Diagnosis not present

## 2015-08-06 NOTE — Patient Instructions (Addendum)
Medication Instructions:   Your physician recommends that you continue on your current medications as directed. Please refer to the Current Medication list given to you today.  Labwork:  Your physician recommends that you return for lab work in 2 months just before your next visit to check your BMET.   Testing/Procedures: NONE  Follow-Up:  Your physician recommends that you schedule a follow-up appointment in:   Any Other Special Instructions Will Be Listed Below (If Applicable).  You have been referred to the Heart Failure Clinic.  If you need a refill on your cardiac medications before your next appointment, please call your pharmacy.

## 2015-08-10 DIAGNOSIS — E1122 Type 2 diabetes mellitus with diabetic chronic kidney disease: Secondary | ICD-10-CM | POA: Diagnosis not present

## 2015-08-10 DIAGNOSIS — J9621 Acute and chronic respiratory failure with hypoxia: Secondary | ICD-10-CM | POA: Diagnosis not present

## 2015-08-10 DIAGNOSIS — I130 Hypertensive heart and chronic kidney disease with heart failure and stage 1 through stage 4 chronic kidney disease, or unspecified chronic kidney disease: Secondary | ICD-10-CM | POA: Diagnosis not present

## 2015-08-10 DIAGNOSIS — E1165 Type 2 diabetes mellitus with hyperglycemia: Secondary | ICD-10-CM | POA: Diagnosis not present

## 2015-08-10 DIAGNOSIS — I5033 Acute on chronic diastolic (congestive) heart failure: Secondary | ICD-10-CM | POA: Diagnosis not present

## 2015-08-10 DIAGNOSIS — N183 Chronic kidney disease, stage 3 (moderate): Secondary | ICD-10-CM | POA: Diagnosis not present

## 2015-08-11 DIAGNOSIS — I130 Hypertensive heart and chronic kidney disease with heart failure and stage 1 through stage 4 chronic kidney disease, or unspecified chronic kidney disease: Secondary | ICD-10-CM | POA: Diagnosis not present

## 2015-08-11 DIAGNOSIS — J9621 Acute and chronic respiratory failure with hypoxia: Secondary | ICD-10-CM | POA: Diagnosis not present

## 2015-08-11 DIAGNOSIS — I5033 Acute on chronic diastolic (congestive) heart failure: Secondary | ICD-10-CM | POA: Diagnosis not present

## 2015-08-11 DIAGNOSIS — N183 Chronic kidney disease, stage 3 (moderate): Secondary | ICD-10-CM | POA: Diagnosis not present

## 2015-08-11 DIAGNOSIS — E1122 Type 2 diabetes mellitus with diabetic chronic kidney disease: Secondary | ICD-10-CM | POA: Diagnosis not present

## 2015-08-11 DIAGNOSIS — E1165 Type 2 diabetes mellitus with hyperglycemia: Secondary | ICD-10-CM | POA: Diagnosis not present

## 2015-08-12 DIAGNOSIS — N183 Chronic kidney disease, stage 3 (moderate): Secondary | ICD-10-CM | POA: Diagnosis not present

## 2015-08-12 DIAGNOSIS — I130 Hypertensive heart and chronic kidney disease with heart failure and stage 1 through stage 4 chronic kidney disease, or unspecified chronic kidney disease: Secondary | ICD-10-CM | POA: Diagnosis not present

## 2015-08-12 DIAGNOSIS — J9621 Acute and chronic respiratory failure with hypoxia: Secondary | ICD-10-CM | POA: Diagnosis not present

## 2015-08-12 DIAGNOSIS — I5033 Acute on chronic diastolic (congestive) heart failure: Secondary | ICD-10-CM | POA: Diagnosis not present

## 2015-08-12 DIAGNOSIS — E1122 Type 2 diabetes mellitus with diabetic chronic kidney disease: Secondary | ICD-10-CM | POA: Diagnosis not present

## 2015-08-12 DIAGNOSIS — E1165 Type 2 diabetes mellitus with hyperglycemia: Secondary | ICD-10-CM | POA: Diagnosis not present

## 2015-08-13 DIAGNOSIS — I5033 Acute on chronic diastolic (congestive) heart failure: Secondary | ICD-10-CM | POA: Diagnosis not present

## 2015-08-13 DIAGNOSIS — I130 Hypertensive heart and chronic kidney disease with heart failure and stage 1 through stage 4 chronic kidney disease, or unspecified chronic kidney disease: Secondary | ICD-10-CM | POA: Diagnosis not present

## 2015-08-13 DIAGNOSIS — N183 Chronic kidney disease, stage 3 (moderate): Secondary | ICD-10-CM | POA: Diagnosis not present

## 2015-08-13 DIAGNOSIS — E1122 Type 2 diabetes mellitus with diabetic chronic kidney disease: Secondary | ICD-10-CM | POA: Diagnosis not present

## 2015-08-13 DIAGNOSIS — E1165 Type 2 diabetes mellitus with hyperglycemia: Secondary | ICD-10-CM | POA: Diagnosis not present

## 2015-08-13 DIAGNOSIS — J9621 Acute and chronic respiratory failure with hypoxia: Secondary | ICD-10-CM | POA: Diagnosis not present

## 2015-08-15 ENCOUNTER — Other Ambulatory Visit: Payer: Self-pay | Admitting: Cardiology

## 2015-08-16 ENCOUNTER — Other Ambulatory Visit (HOSPITAL_COMMUNITY): Payer: Self-pay | Admitting: Pulmonary Disease

## 2015-08-16 ENCOUNTER — Ambulatory Visit (HOSPITAL_COMMUNITY)
Admission: RE | Admit: 2015-08-16 | Discharge: 2015-08-16 | Disposition: A | Discharge status: Home / Self Care | Payer: Medicare Other | Source: Ambulatory Visit | Attending: Pulmonary Disease | Admitting: Pulmonary Disease

## 2015-08-16 DIAGNOSIS — R918 Other nonspecific abnormal finding of lung field: Secondary | ICD-10-CM | POA: Insufficient documentation

## 2015-08-16 DIAGNOSIS — I5033 Acute on chronic diastolic (congestive) heart failure: Secondary | ICD-10-CM | POA: Diagnosis not present

## 2015-08-16 DIAGNOSIS — I517 Cardiomegaly: Secondary | ICD-10-CM | POA: Insufficient documentation

## 2015-08-16 DIAGNOSIS — R0602 Shortness of breath: Secondary | ICD-10-CM | POA: Diagnosis not present

## 2015-08-16 DIAGNOSIS — J9621 Acute and chronic respiratory failure with hypoxia: Secondary | ICD-10-CM | POA: Diagnosis not present

## 2015-08-16 DIAGNOSIS — N183 Chronic kidney disease, stage 3 (moderate): Secondary | ICD-10-CM | POA: Diagnosis not present

## 2015-08-16 DIAGNOSIS — J189 Pneumonia, unspecified organism: Secondary | ICD-10-CM

## 2015-08-16 DIAGNOSIS — E1165 Type 2 diabetes mellitus with hyperglycemia: Secondary | ICD-10-CM | POA: Diagnosis not present

## 2015-08-16 DIAGNOSIS — E1122 Type 2 diabetes mellitus with diabetic chronic kidney disease: Secondary | ICD-10-CM | POA: Diagnosis not present

## 2015-08-16 DIAGNOSIS — I878 Other specified disorders of veins: Secondary | ICD-10-CM | POA: Diagnosis not present

## 2015-08-16 DIAGNOSIS — I130 Hypertensive heart and chronic kidney disease with heart failure and stage 1 through stage 4 chronic kidney disease, or unspecified chronic kidney disease: Secondary | ICD-10-CM | POA: Diagnosis not present

## 2015-08-17 DIAGNOSIS — E1165 Type 2 diabetes mellitus with hyperglycemia: Secondary | ICD-10-CM | POA: Diagnosis not present

## 2015-08-17 DIAGNOSIS — I130 Hypertensive heart and chronic kidney disease with heart failure and stage 1 through stage 4 chronic kidney disease, or unspecified chronic kidney disease: Secondary | ICD-10-CM | POA: Diagnosis not present

## 2015-08-17 DIAGNOSIS — J9621 Acute and chronic respiratory failure with hypoxia: Secondary | ICD-10-CM | POA: Diagnosis not present

## 2015-08-17 DIAGNOSIS — I5033 Acute on chronic diastolic (congestive) heart failure: Secondary | ICD-10-CM | POA: Diagnosis not present

## 2015-08-17 DIAGNOSIS — E1122 Type 2 diabetes mellitus with diabetic chronic kidney disease: Secondary | ICD-10-CM | POA: Diagnosis not present

## 2015-08-17 DIAGNOSIS — N183 Chronic kidney disease, stage 3 (moderate): Secondary | ICD-10-CM | POA: Diagnosis not present

## 2015-08-18 DIAGNOSIS — N183 Chronic kidney disease, stage 3 (moderate): Secondary | ICD-10-CM | POA: Diagnosis not present

## 2015-08-18 DIAGNOSIS — E1165 Type 2 diabetes mellitus with hyperglycemia: Secondary | ICD-10-CM | POA: Diagnosis not present

## 2015-08-18 DIAGNOSIS — I130 Hypertensive heart and chronic kidney disease with heart failure and stage 1 through stage 4 chronic kidney disease, or unspecified chronic kidney disease: Secondary | ICD-10-CM | POA: Diagnosis not present

## 2015-08-18 DIAGNOSIS — I5033 Acute on chronic diastolic (congestive) heart failure: Secondary | ICD-10-CM | POA: Diagnosis not present

## 2015-08-18 DIAGNOSIS — J9621 Acute and chronic respiratory failure with hypoxia: Secondary | ICD-10-CM | POA: Diagnosis not present

## 2015-08-18 DIAGNOSIS — E1122 Type 2 diabetes mellitus with diabetic chronic kidney disease: Secondary | ICD-10-CM | POA: Diagnosis not present

## 2015-08-19 DIAGNOSIS — N183 Chronic kidney disease, stage 3 (moderate): Secondary | ICD-10-CM | POA: Diagnosis not present

## 2015-08-19 DIAGNOSIS — I130 Hypertensive heart and chronic kidney disease with heart failure and stage 1 through stage 4 chronic kidney disease, or unspecified chronic kidney disease: Secondary | ICD-10-CM | POA: Diagnosis not present

## 2015-08-19 DIAGNOSIS — I5033 Acute on chronic diastolic (congestive) heart failure: Secondary | ICD-10-CM | POA: Diagnosis not present

## 2015-08-19 DIAGNOSIS — E1165 Type 2 diabetes mellitus with hyperglycemia: Secondary | ICD-10-CM | POA: Diagnosis not present

## 2015-08-19 DIAGNOSIS — J9621 Acute and chronic respiratory failure with hypoxia: Secondary | ICD-10-CM | POA: Diagnosis not present

## 2015-08-19 DIAGNOSIS — E1122 Type 2 diabetes mellitus with diabetic chronic kidney disease: Secondary | ICD-10-CM | POA: Diagnosis not present

## 2015-08-20 DIAGNOSIS — J9621 Acute and chronic respiratory failure with hypoxia: Secondary | ICD-10-CM | POA: Diagnosis not present

## 2015-08-20 DIAGNOSIS — E1165 Type 2 diabetes mellitus with hyperglycemia: Secondary | ICD-10-CM | POA: Diagnosis not present

## 2015-08-20 DIAGNOSIS — N183 Chronic kidney disease, stage 3 (moderate): Secondary | ICD-10-CM | POA: Diagnosis not present

## 2015-08-20 DIAGNOSIS — E1122 Type 2 diabetes mellitus with diabetic chronic kidney disease: Secondary | ICD-10-CM | POA: Diagnosis not present

## 2015-08-20 DIAGNOSIS — I130 Hypertensive heart and chronic kidney disease with heart failure and stage 1 through stage 4 chronic kidney disease, or unspecified chronic kidney disease: Secondary | ICD-10-CM | POA: Diagnosis not present

## 2015-08-20 DIAGNOSIS — I5033 Acute on chronic diastolic (congestive) heart failure: Secondary | ICD-10-CM | POA: Diagnosis not present

## 2015-08-23 DIAGNOSIS — N183 Chronic kidney disease, stage 3 (moderate): Secondary | ICD-10-CM | POA: Diagnosis not present

## 2015-08-23 DIAGNOSIS — E1165 Type 2 diabetes mellitus with hyperglycemia: Secondary | ICD-10-CM | POA: Diagnosis not present

## 2015-08-23 DIAGNOSIS — I5033 Acute on chronic diastolic (congestive) heart failure: Secondary | ICD-10-CM | POA: Diagnosis not present

## 2015-08-23 DIAGNOSIS — J9621 Acute and chronic respiratory failure with hypoxia: Secondary | ICD-10-CM | POA: Diagnosis not present

## 2015-08-23 DIAGNOSIS — E1122 Type 2 diabetes mellitus with diabetic chronic kidney disease: Secondary | ICD-10-CM | POA: Diagnosis not present

## 2015-08-23 DIAGNOSIS — I130 Hypertensive heart and chronic kidney disease with heart failure and stage 1 through stage 4 chronic kidney disease, or unspecified chronic kidney disease: Secondary | ICD-10-CM | POA: Diagnosis not present

## 2015-08-24 DIAGNOSIS — I5033 Acute on chronic diastolic (congestive) heart failure: Secondary | ICD-10-CM | POA: Diagnosis not present

## 2015-08-24 DIAGNOSIS — E1122 Type 2 diabetes mellitus with diabetic chronic kidney disease: Secondary | ICD-10-CM | POA: Diagnosis not present

## 2015-08-24 DIAGNOSIS — J9621 Acute and chronic respiratory failure with hypoxia: Secondary | ICD-10-CM | POA: Diagnosis not present

## 2015-08-24 DIAGNOSIS — E1165 Type 2 diabetes mellitus with hyperglycemia: Secondary | ICD-10-CM | POA: Diagnosis not present

## 2015-08-24 DIAGNOSIS — N183 Chronic kidney disease, stage 3 (moderate): Secondary | ICD-10-CM | POA: Diagnosis not present

## 2015-08-24 DIAGNOSIS — I130 Hypertensive heart and chronic kidney disease with heart failure and stage 1 through stage 4 chronic kidney disease, or unspecified chronic kidney disease: Secondary | ICD-10-CM | POA: Diagnosis not present

## 2015-08-26 DIAGNOSIS — I130 Hypertensive heart and chronic kidney disease with heart failure and stage 1 through stage 4 chronic kidney disease, or unspecified chronic kidney disease: Secondary | ICD-10-CM | POA: Diagnosis not present

## 2015-08-26 DIAGNOSIS — J9621 Acute and chronic respiratory failure with hypoxia: Secondary | ICD-10-CM | POA: Diagnosis not present

## 2015-08-26 DIAGNOSIS — E1122 Type 2 diabetes mellitus with diabetic chronic kidney disease: Secondary | ICD-10-CM | POA: Diagnosis not present

## 2015-08-26 DIAGNOSIS — E1165 Type 2 diabetes mellitus with hyperglycemia: Secondary | ICD-10-CM | POA: Diagnosis not present

## 2015-08-26 DIAGNOSIS — I5033 Acute on chronic diastolic (congestive) heart failure: Secondary | ICD-10-CM | POA: Diagnosis not present

## 2015-08-26 DIAGNOSIS — N183 Chronic kidney disease, stage 3 (moderate): Secondary | ICD-10-CM | POA: Diagnosis not present

## 2015-08-27 DIAGNOSIS — I130 Hypertensive heart and chronic kidney disease with heart failure and stage 1 through stage 4 chronic kidney disease, or unspecified chronic kidney disease: Secondary | ICD-10-CM | POA: Diagnosis not present

## 2015-08-27 DIAGNOSIS — E1122 Type 2 diabetes mellitus with diabetic chronic kidney disease: Secondary | ICD-10-CM | POA: Diagnosis not present

## 2015-08-27 DIAGNOSIS — J9621 Acute and chronic respiratory failure with hypoxia: Secondary | ICD-10-CM | POA: Diagnosis not present

## 2015-08-27 DIAGNOSIS — I5033 Acute on chronic diastolic (congestive) heart failure: Secondary | ICD-10-CM | POA: Diagnosis not present

## 2015-08-27 DIAGNOSIS — E1165 Type 2 diabetes mellitus with hyperglycemia: Secondary | ICD-10-CM | POA: Diagnosis not present

## 2015-08-27 DIAGNOSIS — N183 Chronic kidney disease, stage 3 (moderate): Secondary | ICD-10-CM | POA: Diagnosis not present

## 2015-08-31 DIAGNOSIS — I5033 Acute on chronic diastolic (congestive) heart failure: Secondary | ICD-10-CM | POA: Diagnosis not present

## 2015-08-31 DIAGNOSIS — J9621 Acute and chronic respiratory failure with hypoxia: Secondary | ICD-10-CM | POA: Diagnosis not present

## 2015-08-31 DIAGNOSIS — E1122 Type 2 diabetes mellitus with diabetic chronic kidney disease: Secondary | ICD-10-CM | POA: Diagnosis not present

## 2015-08-31 DIAGNOSIS — I130 Hypertensive heart and chronic kidney disease with heart failure and stage 1 through stage 4 chronic kidney disease, or unspecified chronic kidney disease: Secondary | ICD-10-CM | POA: Diagnosis not present

## 2015-08-31 DIAGNOSIS — E1165 Type 2 diabetes mellitus with hyperglycemia: Secondary | ICD-10-CM | POA: Diagnosis not present

## 2015-08-31 DIAGNOSIS — N183 Chronic kidney disease, stage 3 (moderate): Secondary | ICD-10-CM | POA: Diagnosis not present

## 2015-09-02 DIAGNOSIS — I639 Cerebral infarction, unspecified: Secondary | ICD-10-CM | POA: Diagnosis not present

## 2015-09-02 DIAGNOSIS — I509 Heart failure, unspecified: Secondary | ICD-10-CM | POA: Diagnosis not present

## 2015-09-02 DIAGNOSIS — Z299 Encounter for prophylactic measures, unspecified: Secondary | ICD-10-CM | POA: Diagnosis not present

## 2015-09-02 DIAGNOSIS — N183 Chronic kidney disease, stage 3 (moderate): Secondary | ICD-10-CM | POA: Diagnosis not present

## 2015-09-02 DIAGNOSIS — E1122 Type 2 diabetes mellitus with diabetic chronic kidney disease: Secondary | ICD-10-CM | POA: Diagnosis not present

## 2015-09-02 DIAGNOSIS — E1165 Type 2 diabetes mellitus with hyperglycemia: Secondary | ICD-10-CM | POA: Diagnosis not present

## 2015-09-02 DIAGNOSIS — I5033 Acute on chronic diastolic (congestive) heart failure: Secondary | ICD-10-CM | POA: Diagnosis not present

## 2015-09-02 DIAGNOSIS — I130 Hypertensive heart and chronic kidney disease with heart failure and stage 1 through stage 4 chronic kidney disease, or unspecified chronic kidney disease: Secondary | ICD-10-CM | POA: Diagnosis not present

## 2015-09-02 DIAGNOSIS — M545 Low back pain: Secondary | ICD-10-CM | POA: Diagnosis not present

## 2015-09-02 DIAGNOSIS — J9621 Acute and chronic respiratory failure with hypoxia: Secondary | ICD-10-CM | POA: Diagnosis not present

## 2015-09-07 DIAGNOSIS — N183 Chronic kidney disease, stage 3 (moderate): Secondary | ICD-10-CM | POA: Diagnosis not present

## 2015-09-07 DIAGNOSIS — J9621 Acute and chronic respiratory failure with hypoxia: Secondary | ICD-10-CM | POA: Diagnosis not present

## 2015-09-07 DIAGNOSIS — E1165 Type 2 diabetes mellitus with hyperglycemia: Secondary | ICD-10-CM | POA: Diagnosis not present

## 2015-09-07 DIAGNOSIS — I5033 Acute on chronic diastolic (congestive) heart failure: Secondary | ICD-10-CM | POA: Diagnosis not present

## 2015-09-07 DIAGNOSIS — I130 Hypertensive heart and chronic kidney disease with heart failure and stage 1 through stage 4 chronic kidney disease, or unspecified chronic kidney disease: Secondary | ICD-10-CM | POA: Diagnosis not present

## 2015-09-07 DIAGNOSIS — E1122 Type 2 diabetes mellitus with diabetic chronic kidney disease: Secondary | ICD-10-CM | POA: Diagnosis not present

## 2015-09-09 DIAGNOSIS — I130 Hypertensive heart and chronic kidney disease with heart failure and stage 1 through stage 4 chronic kidney disease, or unspecified chronic kidney disease: Secondary | ICD-10-CM | POA: Diagnosis not present

## 2015-09-09 DIAGNOSIS — E1165 Type 2 diabetes mellitus with hyperglycemia: Secondary | ICD-10-CM | POA: Diagnosis not present

## 2015-09-09 DIAGNOSIS — J9621 Acute and chronic respiratory failure with hypoxia: Secondary | ICD-10-CM | POA: Diagnosis not present

## 2015-09-09 DIAGNOSIS — I5033 Acute on chronic diastolic (congestive) heart failure: Secondary | ICD-10-CM | POA: Diagnosis not present

## 2015-09-09 DIAGNOSIS — N183 Chronic kidney disease, stage 3 (moderate): Secondary | ICD-10-CM | POA: Diagnosis not present

## 2015-09-09 DIAGNOSIS — E1122 Type 2 diabetes mellitus with diabetic chronic kidney disease: Secondary | ICD-10-CM | POA: Diagnosis not present

## 2015-09-10 ENCOUNTER — Other Ambulatory Visit: Payer: Self-pay | Admitting: Cardiology

## 2015-09-14 ENCOUNTER — Ambulatory Visit (HOSPITAL_COMMUNITY)
Admission: RE | Admit: 2015-09-14 | Discharge: 2015-09-14 | Disposition: A | Discharge status: Home / Self Care | Payer: Medicare Other | Source: Ambulatory Visit | Attending: Cardiology | Admitting: Cardiology

## 2015-09-14 ENCOUNTER — Inpatient Hospital Stay (HOSPITAL_COMMUNITY)
Admission: AD | Admit: 2015-09-14 | Discharge: 2015-09-21 | DRG: 286 | Disposition: A | Discharge status: Home Health | Payer: Medicare Other | Source: Ambulatory Visit | Attending: Cardiology | Admitting: Cardiology

## 2015-09-14 ENCOUNTER — Encounter (HOSPITAL_COMMUNITY): Payer: Self-pay

## 2015-09-14 ENCOUNTER — Encounter (HOSPITAL_COMMUNITY): Payer: Self-pay | Admitting: General Practice

## 2015-09-14 ENCOUNTER — Other Ambulatory Visit (HOSPITAL_COMMUNITY): Payer: Self-pay | Admitting: Adult Health

## 2015-09-14 VITALS — BP 126/56 | HR 62 | Wt 212.8 lb

## 2015-09-14 DIAGNOSIS — E1151 Type 2 diabetes mellitus with diabetic peripheral angiopathy without gangrene: Secondary | ICD-10-CM | POA: Diagnosis present

## 2015-09-14 DIAGNOSIS — Z882 Allergy status to sulfonamides status: Secondary | ICD-10-CM

## 2015-09-14 DIAGNOSIS — Z90710 Acquired absence of both cervix and uterus: Secondary | ICD-10-CM | POA: Diagnosis not present

## 2015-09-14 DIAGNOSIS — N183 Chronic kidney disease, stage 3 (moderate): Secondary | ICD-10-CM | POA: Diagnosis present

## 2015-09-14 DIAGNOSIS — E782 Mixed hyperlipidemia: Secondary | ICD-10-CM | POA: Diagnosis present

## 2015-09-14 DIAGNOSIS — I272 Other secondary pulmonary hypertension: Secondary | ICD-10-CM | POA: Diagnosis present

## 2015-09-14 DIAGNOSIS — J9611 Chronic respiratory failure with hypoxia: Secondary | ICD-10-CM | POA: Diagnosis present

## 2015-09-14 DIAGNOSIS — E1122 Type 2 diabetes mellitus with diabetic chronic kidney disease: Secondary | ICD-10-CM | POA: Diagnosis present

## 2015-09-14 DIAGNOSIS — I2510 Atherosclerotic heart disease of native coronary artery without angina pectoris: Secondary | ICD-10-CM | POA: Diagnosis present

## 2015-09-14 DIAGNOSIS — Z7982 Long term (current) use of aspirin: Secondary | ICD-10-CM | POA: Diagnosis not present

## 2015-09-14 DIAGNOSIS — I5033 Acute on chronic diastolic (congestive) heart failure: Secondary | ICD-10-CM | POA: Diagnosis not present

## 2015-09-14 DIAGNOSIS — J90 Pleural effusion, not elsewhere classified: Secondary | ICD-10-CM | POA: Diagnosis not present

## 2015-09-14 DIAGNOSIS — E0590 Thyrotoxicosis, unspecified without thyrotoxic crisis or storm: Secondary | ICD-10-CM | POA: Diagnosis present

## 2015-09-14 DIAGNOSIS — I5031 Acute diastolic (congestive) heart failure: Secondary | ICD-10-CM | POA: Diagnosis present

## 2015-09-14 DIAGNOSIS — Z955 Presence of coronary angioplasty implant and graft: Secondary | ICD-10-CM | POA: Diagnosis not present

## 2015-09-14 DIAGNOSIS — Z91018 Allergy to other foods: Secondary | ICD-10-CM

## 2015-09-14 DIAGNOSIS — I130 Hypertensive heart and chronic kidney disease with heart failure and stage 1 through stage 4 chronic kidney disease, or unspecified chronic kidney disease: Secondary | ICD-10-CM | POA: Diagnosis present

## 2015-09-14 DIAGNOSIS — R0602 Shortness of breath: Secondary | ICD-10-CM | POA: Diagnosis not present

## 2015-09-14 DIAGNOSIS — Z87891 Personal history of nicotine dependence: Secondary | ICD-10-CM

## 2015-09-14 DIAGNOSIS — E1165 Type 2 diabetes mellitus with hyperglycemia: Secondary | ICD-10-CM | POA: Diagnosis not present

## 2015-09-14 DIAGNOSIS — Z91048 Other nonmedicinal substance allergy status: Secondary | ICD-10-CM

## 2015-09-14 DIAGNOSIS — I5023 Acute on chronic systolic (congestive) heart failure: Secondary | ICD-10-CM | POA: Diagnosis not present

## 2015-09-14 DIAGNOSIS — Z9981 Dependence on supplemental oxygen: Secondary | ICD-10-CM

## 2015-09-14 DIAGNOSIS — Z8673 Personal history of transient ischemic attack (TIA), and cerebral infarction without residual deficits: Secondary | ICD-10-CM

## 2015-09-14 DIAGNOSIS — R32 Unspecified urinary incontinence: Secondary | ICD-10-CM | POA: Diagnosis present

## 2015-09-14 DIAGNOSIS — J449 Chronic obstructive pulmonary disease, unspecified: Secondary | ICD-10-CM | POA: Diagnosis present

## 2015-09-14 DIAGNOSIS — M25551 Pain in right hip: Secondary | ICD-10-CM | POA: Diagnosis present

## 2015-09-14 DIAGNOSIS — Z951 Presence of aortocoronary bypass graft: Secondary | ICD-10-CM | POA: Diagnosis not present

## 2015-09-14 DIAGNOSIS — I252 Old myocardial infarction: Secondary | ICD-10-CM

## 2015-09-14 DIAGNOSIS — R0902 Hypoxemia: Secondary | ICD-10-CM

## 2015-09-14 DIAGNOSIS — Z794 Long term (current) use of insulin: Secondary | ICD-10-CM | POA: Diagnosis not present

## 2015-09-14 DIAGNOSIS — R0600 Dyspnea, unspecified: Secondary | ICD-10-CM

## 2015-09-14 DIAGNOSIS — I509 Heart failure, unspecified: Secondary | ICD-10-CM | POA: Diagnosis not present

## 2015-09-14 DIAGNOSIS — Z79899 Other long term (current) drug therapy: Secondary | ICD-10-CM | POA: Diagnosis not present

## 2015-09-14 DIAGNOSIS — Z7902 Long term (current) use of antithrombotics/antiplatelets: Secondary | ICD-10-CM | POA: Diagnosis not present

## 2015-09-14 DIAGNOSIS — Z4682 Encounter for fitting and adjustment of non-vascular catheter: Secondary | ICD-10-CM | POA: Diagnosis not present

## 2015-09-14 DIAGNOSIS — J9621 Acute and chronic respiratory failure with hypoxia: Secondary | ICD-10-CM | POA: Diagnosis not present

## 2015-09-14 DIAGNOSIS — G8929 Other chronic pain: Secondary | ICD-10-CM | POA: Diagnosis present

## 2015-09-14 LAB — COMPREHENSIVE METABOLIC PANEL
ALT: 18 U/L (ref 14–54)
AST: 20 U/L (ref 15–41)
Albumin: 3.8 g/dL (ref 3.5–5.0)
Alkaline Phosphatase: 82 U/L (ref 38–126)
Anion gap: 9 (ref 5–15)
BILIRUBIN TOTAL: 0.9 mg/dL (ref 0.3–1.2)
BUN: 81 mg/dL — AB (ref 6–20)
CO2: 28 mmol/L (ref 22–32)
CREATININE: 1.99 mg/dL — AB (ref 0.44–1.00)
Calcium: 9 mg/dL (ref 8.9–10.3)
Chloride: 98 mmol/L — ABNORMAL LOW (ref 101–111)
GFR, EST AFRICAN AMERICAN: 29 mL/min — AB (ref >60)
GFR, EST NON AFRICAN AMERICAN: 25 mL/min — AB (ref >60)
Glucose, Bld: 147 mg/dL — ABNORMAL HIGH (ref 65–99)
POTASSIUM: 4.4 mmol/L (ref 3.5–5.1)
Sodium: 135 mmol/L (ref 135–145)
TOTAL PROTEIN: 7.8 g/dL (ref 6.5–8.1)

## 2015-09-14 LAB — GLUCOSE, CAPILLARY
GLUCOSE-CAPILLARY: 132 mg/dL — AB (ref 65–99)
Glucose-Capillary: 220 mg/dL — ABNORMAL HIGH (ref 65–99)

## 2015-09-14 LAB — CBC WITH DIFFERENTIAL/PLATELET
BASOS ABS: 0 10*3/uL (ref 0.0–0.1)
Basophils Relative: 0 %
EOS PCT: 2 %
Eosinophils Absolute: 0.1 10*3/uL (ref 0.0–0.7)
HEMATOCRIT: 35.3 % — AB (ref 36.0–46.0)
Hemoglobin: 10.5 g/dL — ABNORMAL LOW (ref 12.0–15.0)
LYMPHS ABS: 0.9 10*3/uL (ref 0.7–4.0)
LYMPHS PCT: 10 %
MCH: 26.9 pg (ref 26.0–34.0)
MCHC: 29.7 g/dL — ABNORMAL LOW (ref 30.0–36.0)
MCV: 90.3 fL (ref 78.0–100.0)
MONO ABS: 0.6 10*3/uL (ref 0.1–1.0)
MONOS PCT: 7 %
NEUTROS ABS: 7.6 10*3/uL (ref 1.7–7.7)
Neutrophils Relative %: 81 %
Platelets: 227 10*3/uL (ref 150–400)
RBC: 3.91 MIL/uL (ref 3.87–5.11)
RDW: 17 % — AB (ref 11.5–15.5)
WBC: 9.3 10*3/uL (ref 4.0–10.5)

## 2015-09-14 LAB — MAGNESIUM: MAGNESIUM: 3.1 mg/dL — AB (ref 1.7–2.4)

## 2015-09-14 LAB — TSH: TSH: 0.087 u[IU]/mL — ABNORMAL LOW (ref 0.350–4.500)

## 2015-09-14 LAB — BRAIN NATRIURETIC PEPTIDE: B NATRIURETIC PEPTIDE 5: 637.9 pg/mL — AB (ref 0.0–100.0)

## 2015-09-14 MED ORDER — ASPIRIN EC 81 MG PO TBEC
81.0000 mg | DELAYED_RELEASE_TABLET | Freq: Every day | ORAL | Status: DC
  Administered 2015-09-15 – 2015-09-21 (×7): 81 mg via ORAL
  Filled 2015-09-14 (×8): qty 1

## 2015-09-14 MED ORDER — ADULT MULTIVITAMIN W/MINERALS CH
1.0000 | ORAL_TABLET | Freq: Every day | ORAL | Status: DC
  Administered 2015-09-15 – 2015-09-21 (×7): 1 via ORAL
  Filled 2015-09-14 (×7): qty 1

## 2015-09-14 MED ORDER — CLOPIDOGREL BISULFATE 75 MG PO TABS
75.0000 mg | ORAL_TABLET | Freq: Every day | ORAL | Status: DC
  Administered 2015-09-15: 75 mg via ORAL
  Filled 2015-09-14 (×2): qty 1

## 2015-09-14 MED ORDER — INSULIN ASPART 100 UNIT/ML ~~LOC~~ SOLN
3.0000 [IU] | Freq: Three times a day (TID) | SUBCUTANEOUS | Status: DC
  Administered 2015-09-14 – 2015-09-16 (×6): 3 [IU] via SUBCUTANEOUS

## 2015-09-14 MED ORDER — HYDRALAZINE HCL 25 MG PO TABS
25.0000 mg | ORAL_TABLET | Freq: Three times a day (TID) | ORAL | Status: DC
  Administered 2015-09-14: 25 mg via ORAL
  Filled 2015-09-14: qty 1

## 2015-09-14 MED ORDER — ENOXAPARIN SODIUM 40 MG/0.4ML ~~LOC~~ SOLN
40.0000 mg | SUBCUTANEOUS | Status: DC
  Administered 2015-09-14: 40 mg via SUBCUTANEOUS
  Filled 2015-09-14: qty 0.4

## 2015-09-14 MED ORDER — DOCUSATE SODIUM 100 MG PO CAPS
100.0000 mg | ORAL_CAPSULE | Freq: Every day | ORAL | Status: DC | PRN
  Administered 2015-09-21: 100 mg via ORAL
  Filled 2015-09-14: qty 1

## 2015-09-14 MED ORDER — SODIUM CHLORIDE 0.9% FLUSH: 3.0000 mL | INTRAVENOUS | Status: DC | PRN

## 2015-09-14 MED ORDER — SODIUM CHLORIDE 0.9% FLUSH
3.0000 mL | Freq: Two times a day (BID) | INTRAVENOUS | Status: DC
  Administered 2015-09-14 – 2015-09-21 (×9): 3 mL via INTRAVENOUS

## 2015-09-14 MED ORDER — ISOSORBIDE MONONITRATE ER 30 MG PO TB24
30.0000 mg | ORAL_TABLET | Freq: Every day | ORAL | Status: DC
  Administered 2015-09-14 – 2015-09-21 (×8): 30 mg via ORAL
  Filled 2015-09-14 (×8): qty 1

## 2015-09-14 MED ORDER — ACETAMINOPHEN 500 MG PO TABS: 1000.0000 mg | ORAL_TABLET | Freq: Four times a day (QID) | ORAL | Status: DC | PRN

## 2015-09-14 MED ORDER — AMLODIPINE BESYLATE 10 MG PO TABS
10.0000 mg | ORAL_TABLET | Freq: Every day | ORAL | Status: DC
  Administered 2015-09-15 – 2015-09-21 (×7): 10 mg via ORAL
  Filled 2015-09-14 (×7): qty 1

## 2015-09-14 MED ORDER — CEFUROXIME AXETIL 500 MG PO TABS: 500.0000 mg | ORAL_TABLET | Freq: Two times a day (BID) | ORAL | Status: DC

## 2015-09-14 MED ORDER — GABAPENTIN 300 MG PO CAPS: 300.0000 mg | ORAL_CAPSULE | Freq: Every day | ORAL | Status: DC

## 2015-09-14 MED ORDER — FUROSEMIDE 10 MG/ML IJ SOLN
80.0000 mg | Freq: Two times a day (BID) | INTRAMUSCULAR | Status: DC
  Administered 2015-09-14: 80 mg via INTRAVENOUS
  Filled 2015-09-14: qty 8

## 2015-09-14 MED ORDER — UMECLIDINIUM-VILANTEROL 62.5-25 MCG/INH IN AEPB: 1.0000 | INHALATION_SPRAY | Freq: Every day | RESPIRATORY_TRACT | Status: DC

## 2015-09-14 MED ORDER — CITALOPRAM HYDROBROMIDE 20 MG PO TABS
40.0000 mg | ORAL_TABLET | Freq: Every day | ORAL | Status: DC
  Administered 2015-09-15 – 2015-09-21 (×7): 40 mg via ORAL
  Filled 2015-09-14 (×7): qty 2

## 2015-09-14 MED ORDER — INSULIN ASPART 100 UNIT/ML ~~LOC~~ SOLN
0.0000 [IU] | Freq: Three times a day (TID) | SUBCUTANEOUS | Status: DC
  Administered 2015-09-14: 2 [IU] via SUBCUTANEOUS
  Administered 2015-09-15: 3 [IU] via SUBCUTANEOUS
  Administered 2015-09-15: 2 [IU] via SUBCUTANEOUS
  Administered 2015-09-15: 8 [IU] via SUBCUTANEOUS
  Administered 2015-09-16: 3 [IU] via SUBCUTANEOUS
  Administered 2015-09-16: 2 [IU] via SUBCUTANEOUS
  Administered 2015-09-16: 8 [IU] via SUBCUTANEOUS
  Administered 2015-09-17: 2 [IU] via SUBCUTANEOUS
  Administered 2015-09-17 – 2015-09-18 (×3): 3 [IU] via SUBCUTANEOUS
  Administered 2015-09-18: 5 [IU] via SUBCUTANEOUS
  Administered 2015-09-18: 3 [IU] via SUBCUTANEOUS
  Administered 2015-09-19: 8 [IU] via SUBCUTANEOUS
  Administered 2015-09-19: 3 [IU] via SUBCUTANEOUS
  Administered 2015-09-19: 5 [IU] via SUBCUTANEOUS
  Administered 2015-09-20 (×2): 2 [IU] via SUBCUTANEOUS
  Administered 2015-09-20 – 2015-09-21 (×2): 3 [IU] via SUBCUTANEOUS
  Administered 2015-09-21: 2 [IU] via SUBCUTANEOUS

## 2015-09-14 MED ORDER — SODIUM CHLORIDE 0.9 % IV SOLN: 250.0000 mL | INTRAVENOUS | Status: DC | PRN

## 2015-09-14 MED ORDER — HYDROCODONE-ACETAMINOPHEN 5-325 MG PO TABS
1.0000 | ORAL_TABLET | Freq: Four times a day (QID) | ORAL | Status: DC | PRN
  Administered 2015-09-14 – 2015-09-21 (×19): 1 via ORAL
  Filled 2015-09-14 (×20): qty 1

## 2015-09-14 MED ORDER — UMECLIDINIUM BROMIDE 62.5 MCG/INH IN AEPB
1.0000 | INHALATION_SPRAY | Freq: Every day | RESPIRATORY_TRACT | Status: DC
  Administered 2015-09-15 – 2015-09-21 (×6): 1 via RESPIRATORY_TRACT
  Filled 2015-09-14: qty 7

## 2015-09-14 MED ORDER — ARFORMOTEROL TARTRATE 15 MCG/2ML IN NEBU
15.0000 ug | INHALATION_SOLUTION | Freq: Two times a day (BID) | RESPIRATORY_TRACT | Status: DC
  Administered 2015-09-15 – 2015-09-21 (×12): 15 ug via RESPIRATORY_TRACT
  Filled 2015-09-14 (×12): qty 2

## 2015-09-14 MED ORDER — GABAPENTIN 300 MG PO CAPS
300.0000 mg | ORAL_CAPSULE | Freq: Every day | ORAL | Status: DC
  Administered 2015-09-14 – 2015-09-20 (×7): 300 mg via ORAL
  Filled 2015-09-14 (×7): qty 1

## 2015-09-14 MED ORDER — NITROGLYCERIN 0.4 MG SL SUBL: 0.4000 mg | SUBLINGUAL_TABLET | SUBLINGUAL | Status: DC | PRN

## 2015-09-14 MED ORDER — ONDANSETRON HCL 4 MG/2ML IJ SOLN: 4.0000 mg | Freq: Four times a day (QID) | INTRAMUSCULAR | Status: DC | PRN

## 2015-09-14 MED ORDER — ATORVASTATIN CALCIUM 40 MG PO TABS
40.0000 mg | ORAL_TABLET | Freq: Every day | ORAL | Status: DC
  Administered 2015-09-14 – 2015-09-20 (×7): 40 mg via ORAL
  Filled 2015-09-14 (×7): qty 1

## 2015-09-14 MED ORDER — ACETAMINOPHEN 325 MG PO TABS
650.0000 mg | ORAL_TABLET | ORAL | Status: DC | PRN
  Administered 2015-09-14 – 2015-09-15 (×2): 650 mg via ORAL
  Filled 2015-09-14 (×2): qty 2

## 2015-09-14 MED ORDER — ALBUTEROL SULFATE (2.5 MG/3ML) 0.083% IN NEBU: 3.0000 mL | INHALATION_SOLUTION | Freq: Every day | RESPIRATORY_TRACT | Status: DC | PRN

## 2015-09-14 NOTE — Progress Notes (Signed)
PCP: Dr. Woody Seller Cardiology: Dr. Domenic Polite HF Cardiology: Dr. Aundra Dubin  70 yo with history of chronic diastolic CHF, CAD s/p CABG, chronic right pleural effusion, CKD stage III, and PAD presents for CHF clinic evaluation.  She has had multiple admissions with diastolic CHF complicated by CKD.  Most recent admission was in 7/17 with admission for diuresis.  Worsening renal function was noted.  Lisinopril was stopped with elevated creatinine and Coreg was stopped with bradycardia.  Last echo in 7/17 showed EF 55-60% with grade II diastolic dysfunction and D-shaped interventricular septum.  She has been wearing home oxygen for > 1 year.  No chest pain.  She walks with a walker, does ok in the house but short of breath walking up steps, up a hill, or with moderate housework.  No PND, +orthopnea and bendopnea.  No lightheadedness/syncope.  No palpitations.  She snores at night and is tired during the day.  Over the last week, she has gained about 15 lbs and has increased abdominal and lower leg swelling.  She says she was planning on going to [facility] ER today for marked weight gain.   ECG (7/17): sinus brady, possible prior anterior MI.   Labs (7/17): K 4.2, creatinine 2.04  PMH: 1. Chronic diastolic CHF: Multiple admissions.  - Echo (7/17) with EF 55-60%, grade II diastolic dysfunction, D-shaped interventricular septum, mild MR, PASP 52 mmHg.  - Uses home oxygen.  2. H/o CVA 3. Recurrent right pleural effusion: S/p Pleurx catheter placement.  4. Depression 5. HTN 6. Carotid stenosis: Bilateral CEAs 7. Type II diabetes.  8. CAD: NSTEMI 11/12, had CABG with LIMA-LAD, SVG-PLV, SVG-OM, SVG-D.   - LHC (9/15) with DES to SVG-D.  Other grafts patent.  9. PAD: Left femoral-popliteal bypass 9/15.  - peripheral arterial dopplers (2/17) with right ABI 0.53, left ABI 1.02.  10. CKD stage III. Lisinopril stopped with increased creatinine.  11. Bradycardia: Stopped Coreg.   SH: Lives in [place] with son.  Prior  smoker, quit 2012.  Retired.   Family History  Problem Relation Age of Onset  . Coronary artery disease Father     Died with MI age 23  . Heart disease Father     before age 32  . Heart attack Father   . Diabetes type II Mother   . Hypertension Mother   . Diabetes Mother   . Heart disease Mother   . Hyperlipidemia Mother   . Heart failure Sister   . Cancer Sister   . Cancer Brother     Lung cancer  . Diabetes Daughter   . Hyperlipidemia Daughter   . Diabetes Son   . Hyperlipidemia Son    ROS: All systems reviewed and negative except as per HPI.   BP (!) 126/56   Pulse 62   Wt 212 lb 12 oz (96.5 kg)   SpO2 91% Comment: on 2L of O2  BMI 34.34 kg/m  General: NAD Neck: JVP 14+ cm, no thyromegaly or thyroid nodule.  Lungs: Crackles at bases bilaterally.  CV: Nondisplaced PMI.  Heart regular S1/S2, no S3/S4, 2/6 SEM RUSB. 1+ edema to knees bilaterally.  No carotid bruit. Difficult to palpate pedal pulses.  Abdomen: Soft, nontender, no hepatosplenomegaly, no distention.  Skin: Intact without lesions or rashes.  Neurologic: Alert and oriented x 3.  Psych: Normal affect. Extremities: No clubbing.  Right foot more dusky than left foot.  HEENT: Normal.   Assessment/plan: 1. Acute on chronic diastolic CHF: I suspect that there  is a significant component of RV failure as well.  15 lb weight gain over the last week or so.  She is significantly volume overloaded on exam. Diuresis is complicated by CKD stage III, suspect cardiorenal syndrome.  With her degree of volume overload, I think that we will need to admit her for diuresis and careful renal function monitoring. It may be that by lowering renal venous pressure, her creatinine will improve.  - Admit today, start Lasix 80 mg IV every 8 hrs.  Monitor creatinine closely.  2. CKD stage III: Follow creatinine closely with diuresis.  3. CAD: s/p CABG.  No chest pain.  H/o PCI to SVG-D in 9/15.  - Continue ASA 81, Plavix, statin.  4.  Recurrent right pleural effusion: Right Pleurx catheter no longer draining.  Will get CXR PA/Lat this admission, may be able to remove catheter.  5. Suspect OSA: Will need sleep study as outpatient.  6. PAD: ABI 0.5 in right foot.  Needs followup with Dr. Gwenlyn Found after discharge.   Loralie Champagne 09/14/2015 3:31 PM

## 2015-09-14 NOTE — H&P (Addendum)
ADVANCED HEART FAILURE H&P  PCP: Dr. Woody Seller Cardiology: Dr. Domenic Polite HF Cardiology: Dr. Aundra Dubin  70 yo with history of chronic diastolic CHF, CAD s/p CABG, chronic right pleural effusion, CKD stage III, and PAD presents for CHF clinic evaluation.  She has had multiple admissions with diastolic CHF complicated by CKD.  Most recent admission was in 7/17 with admission for diuresis.  Worsening renal function was noted.  Lisinopril was stopped with elevated creatinine and Coreg was stopped with bradycardia.  Last echo in 7/17 showed EF 55-60% with grade II diastolic dysfunction and D-shaped interventricular septum.  She has been wearing home oxygen for > 1 year.  No chest pain.  She walks with a walker, does ok in the house but short of breath walking up steps, up a hill, or with moderate housework.  No PND, +orthopnea and bendopnea.  No lightheadedness/syncope.  No palpitations.  She snores at night and is tired during the day.  Over the last week, she has gained about 15 lbs and has increased abdominal and lower leg swelling.  She says she was planning on going to [facility] ER today for marked weight gain.   ECG (7/17): sinus brady, possible prior anterior MI.   Labs (7/17): K 4.2, creatinine 2.04  PMH: 1. Chronic diastolic CHF: Multiple admissions.  - Echo (7/17) with EF 55-60%, grade II diastolic dysfunction, D-shaped interventricular septum, mild MR, PASP 52 mmHg.  - Uses home oxygen.  2. H/o CVA 3. Recurrent right pleural effusion: S/p Pleurx catheter placement.  4. Depression 5. HTN 6. Carotid stenosis: Bilateral CEAs 7. Type II diabetes.  8. CAD: NSTEMI 11/12, had CABG with LIMA-LAD, SVG-PLV, SVG-OM, SVG-D.   - LHC (9/15) with DES to SVG-D.  Other grafts patent.  9. PAD: Left femoral-popliteal bypass 9/15.  - peripheral arterial dopplers (2/17) with right ABI 0.53, left ABI 1.02.  10. CKD stage III. Lisinopril stopped with increased creatinine.  11. Bradycardia: Stopped Coreg.    SH: Lives in [place] with son.  Prior smoker, quit 2012.  Retired.         Family History  Problem Relation Age of Onset  . Coronary artery disease Father     Died with MI age 22  . Heart disease Father     before age 12  . Heart attack Father   . Diabetes type II Mother   . Hypertension Mother   . Diabetes Mother   . Heart disease Mother   . Hyperlipidemia Mother   . Heart failure Sister   . Cancer Sister   . Cancer Brother     Lung cancer  . Diabetes Daughter   . Hyperlipidemia Daughter   . Diabetes Son   . Hyperlipidemia Son    ROS: All systems reviewed and negative except as per HPI.   Current Facility-Administered Medications  Medication Dose Route Frequency Provider Last Rate Last Dose  . 0.9 %  sodium chloride infusion  250 mL Intravenous PRN Amy D Clegg, NP      . acetaminophen (TYLENOL) tablet 1,000 mg  1,000 mg Oral Q6H PRN Amy D Clegg, NP      . acetaminophen (TYLENOL) tablet 650 mg  650 mg Oral Q4H PRN Amy D Clegg, NP      . albuterol (PROVENTIL HFA;VENTOLIN HFA) 108 (90 Base) MCG/ACT inhaler 2 puff  2 puff Inhalation Daily PRN Amy D Clegg, NP      . amLODipine (NORVASC) tablet 10 mg  10 mg Oral Daily Amy  Estrella Deeds, NP      . aspirin EC tablet 81 mg  81 mg Oral Daily Amy D Clegg, NP      . atorvastatin (LIPITOR) tablet 40 mg  40 mg Oral q1800 Amy D Clegg, NP      . cefUROXime (CEFTIN) tablet 500 mg  500 mg Oral BID WC Amy D Clegg, NP      . citalopram (CELEXA) tablet 40 mg  40 mg Oral Daily Amy D Clegg, NP      . clopidogrel (PLAVIX) tablet 75 mg  75 mg Oral Daily Amy D Clegg, NP      . docusate sodium (COLACE) capsule 100 mg  100 mg Oral Daily PRN Amy D Clegg, NP      . enoxaparin (LOVENOX) injection 40 mg  40 mg Subcutaneous Q24H Amy D Clegg, NP      . furosemide (LASIX) injection 80 mg  80 mg Intravenous BID Amy D Clegg, NP      . gabapentin (NEURONTIN) capsule 300 mg  300 mg Oral Daily Amy D Clegg, NP      . hydrALAZINE (APRESOLINE)  tablet 25 mg  25 mg Oral TID Amy D Clegg, NP      . HYDROcodone-acetaminophen (NORCO/VICODIN) 5-325 MG per tablet 1 tablet  1 tablet Oral Q6H PRN Amy D Clegg, NP      . insulin aspart (novoLOG) injection 0-15 Units  0-15 Units Subcutaneous TID WC Amy D Clegg, NP      . insulin aspart (novoLOG) injection 3 Units  3 Units Subcutaneous TID WC Amy D Clegg, NP      . isosorbide mononitrate (IMDUR) 24 hr tablet 30 mg  30 mg Oral Daily Amy D Clegg, NP      . multivitamin with minerals tablet 1 tablet  1 tablet Oral Daily Amy D Clegg, NP      . nitroGLYCERIN (NITROSTAT) SL tablet 0.4 mg  0.4 mg Sublingual Q5 min PRN Amy D Clegg, NP      . ondansetron (ZOFRAN) injection 4 mg  4 mg Intravenous Q6H PRN Amy D Clegg, NP      . sodium chloride flush (NS) 0.9 % injection 3 mL  3 mL Intravenous Q12H Amy D Clegg, NP      . sodium chloride flush (NS) 0.9 % injection 3 mL  3 mL Intravenous PRN Amy D Clegg, NP      . umeclidinium-vilanterol (ANORO ELLIPTA) 62.5-25 MCG/INH 1 puff  1 puff Inhalation Daily Amy D Clegg, NP         BP (!) 126/56   Pulse 62   Wt 212 lb 12 oz (96.5 kg)   SpO2 91% Comment: on 2L of O2  BMI 34.34 kg/m  General: NAD Neck: JVP 14+ cm, no thyromegaly or thyroid nodule.  Lungs: Crackles at bases bilaterally.  CV: Nondisplaced PMI.  Heart regular S1/S2, no S3/S4, 2/6 SEM RUSB. 1+ edema to knees bilaterally.  No carotid bruit. Difficult to palpate pedal pulses.  Abdomen: Soft, nontender, no hepatosplenomegaly, no distention.  Skin: Intact without lesions or rashes.  Neurologic: Alert and oriented x 3.  Psych: Normal affect. Extremities: No clubbing.  Right foot more dusky than left foot.  HEENT: Normal.   Assessment/plan: 1. Acute on chronic diastolic CHF: I suspect that there is a significant component of RV failure as well.  15 lb weight gain over the last week or so.  She is significantly volume overloaded on exam. Diuresis is complicated by  CKD stage III, suspect cardiorenal  syndrome.  With her degree of volume overload, I think that we will need to admit her for diuresis and careful renal function monitoring. It may be that by lowering renal venous pressure, her creatinine will improve.  - Admit today, start Lasix 80 mg IV every 8 hrs.  Monitor creatinine closely.  2. CKD stage III: Follow creatinine closely with diuresis.  3. CAD: s/p CABG.  No chest pain.  H/o PCI to SVG-D in 9/15.  - Continue ASA 81, Plavix, statin.  4. Recurrent right pleural effusion: Right Pleurx catheter no longer draining.  Will get CXR PA/Lat this admission, may be able to remove catheter.  5. Suspect OSA: Will need sleep study as outpatient.  6. PAD: ABI 0.5 in right foot.  Needs followup with Dr. Gwenlyn Found after discharge.    Amy Clegg NP-C  3:38 PM  Loralie Champagne 09/14/2015 3:31 PM

## 2015-09-15 ENCOUNTER — Inpatient Hospital Stay (HOSPITAL_COMMUNITY): Payer: Medicare Other

## 2015-09-15 DIAGNOSIS — I5033 Acute on chronic diastolic (congestive) heart failure: Secondary | ICD-10-CM

## 2015-09-15 DIAGNOSIS — N183 Chronic kidney disease, stage 3 (moderate): Secondary | ICD-10-CM

## 2015-09-15 LAB — GLUCOSE, CAPILLARY
GLUCOSE-CAPILLARY: 127 mg/dL — AB (ref 65–99)
GLUCOSE-CAPILLARY: 163 mg/dL — AB (ref 65–99)
Glucose-Capillary: 253 mg/dL — ABNORMAL HIGH (ref 65–99)
Glucose-Capillary: 196 mg/dL — ABNORMAL HIGH (ref 65–99)

## 2015-09-15 LAB — TSH: TSH: 0.103 u[IU]/mL — AB (ref 0.350–4.500)

## 2015-09-15 LAB — BASIC METABOLIC PANEL
Anion gap: 13 (ref 5–15)
BUN: 76 mg/dL — AB (ref 6–20)
CO2: 28 mmol/L (ref 22–32)
CREATININE: 1.89 mg/dL — AB (ref 0.44–1.00)
Calcium: 8.5 mg/dL — ABNORMAL LOW (ref 8.9–10.3)
Chloride: 96 mmol/L — ABNORMAL LOW (ref 101–111)
GFR calc Af Amer: 30 mL/min — ABNORMAL LOW (ref >60)
GFR, EST NON AFRICAN AMERICAN: 26 mL/min — AB (ref >60)
GLUCOSE: 131 mg/dL — AB (ref 65–99)
POTASSIUM: 4.5 mmol/L (ref 3.5–5.1)
Sodium: 137 mmol/L (ref 135–145)

## 2015-09-15 LAB — HEMOGLOBIN A1C
HEMOGLOBIN A1C: 6.4 % — AB (ref 4.8–5.6)
MEAN PLASMA GLUCOSE: 137 mg/dL

## 2015-09-15 LAB — T4, FREE: Free T4: 2.33 ng/dL — ABNORMAL HIGH (ref 0.61–1.12)

## 2015-09-15 MED ORDER — FUROSEMIDE 10 MG/ML IJ SOLN
80.0000 mg | Freq: Three times a day (TID) | INTRAMUSCULAR | Status: DC
  Administered 2015-09-15 – 2015-09-16 (×4): 80 mg via INTRAVENOUS
  Filled 2015-09-15 (×5): qty 8

## 2015-09-15 MED ORDER — ENOXAPARIN SODIUM 40 MG/0.4ML ~~LOC~~ SOLN: 40.0000 mg | SUBCUTANEOUS | Status: DC

## 2015-09-15 MED ORDER — HYDRALAZINE HCL 50 MG PO TABS
50.0000 mg | ORAL_TABLET | Freq: Three times a day (TID) | ORAL | Status: DC
  Administered 2015-09-15 – 2015-09-16 (×3): 50 mg via ORAL
  Filled 2015-09-15 (×6): qty 1

## 2015-09-15 MED ORDER — LOSARTAN POTASSIUM 25 MG PO TABS
12.5000 mg | ORAL_TABLET | Freq: Every day | ORAL | Status: DC
  Administered 2015-09-15 – 2015-09-17 (×3): 12.5 mg via ORAL
  Filled 2015-09-15 (×4): qty 1

## 2015-09-15 NOTE — Progress Notes (Signed)
Patient has Ted hose on left leg however refuses it on the right leg because it causes her leg to hurt.  Will continue to monitor.

## 2015-09-15 NOTE — Progress Notes (Signed)
Patient ID: [name] [name], female   DOB: [birth year], 70 y.o.   MRN: [id number]   SUBJECTIVE: Good diuresis overnight, weight down 4 lbs.  Creatinine down to 1.89.  Breathing ok.  TSH noted to be low.   Scheduled Meds: . amLODipine  10 mg Oral Daily  . umeclidinium bromide  1 puff Inhalation Daily   And  . arformoterol  15 mcg Nebulization BID  . aspirin EC  81 mg Oral Daily  . atorvastatin  40 mg Oral q1800  . cefUROXime  500 mg Oral BID WC  . citalopram  40 mg Oral Daily  . clopidogrel  75 mg Oral Daily  . enoxaparin (LOVENOX) injection  40 mg Subcutaneous Q24H  . furosemide  80 mg Intravenous Q8H  . gabapentin  300 mg Oral QHS  . hydrALAZINE  50 mg Oral TID  . insulin aspart  0-15 Units Subcutaneous TID WC  . insulin aspart  3 Units Subcutaneous TID WC  . isosorbide mononitrate  30 mg Oral Daily  . multivitamin with minerals  1 tablet Oral Daily  . sodium chloride flush  3 mL Intravenous Q12H   Continuous Infusions:  PRN Meds:.sodium chloride, acetaminophen, albuterol, docusate sodium, HYDROcodone-acetaminophen, nitroGLYCERIN, ondansetron (ZOFRAN) IV, sodium chloride flush    Vitals:   09/14/15 1618 09/14/15 1931 09/15/15 0030 09/15/15 0607  BP: (!) 174/74 (!) 156/81 (!) 143/52 (!) 151/54  Pulse: 65 (!) 51 (!) 59 (!) 52  Resp: _0 Temp: 97.7 F (36.5 C) 97.7 F (36.5 C)  98 F (36.7 C)  TempSrc: Oral Oral Oral Oral  SpO2: 92% 93% 92% 92%  Weight: 211 lb 4.8 oz (95.8 kg)   207 lb 8 oz (94.1 kg)  Height: _1  (1.676 m)       Intake/Output Summary (Last 24 hours) at 09/15/15 0718 Last data filed at 09/15/15 0610  Gross per 24 hour  Intake              240 ml  Output             1350 ml  Net            -1110 ml    LABS: Basic Metabolic Panel:  Recent Labs  09/14/15 1654 09/15/15 0233  NA 135 137  K 4.4 4.5  CL 98* 96*  CO2 28 28  GLUCOSE 147* 131*  BUN 81* 76*  CREATININE 1.99* 1.89*  CALCIUM 9.0 8.5*  MG 3.1*  --    Liver Function  Tests:  Recent Labs  09/14/15 1654  AST 20  ALT 18  ALKPHOS 82  BILITOT 0.9  PROT 7.8  ALBUMIN 3.8   No results for input(s): LIPASE, AMYLASE in the last 72 hours. CBC:  Recent Labs  09/14/15 1654  WBC 9.3  NEUTROABS 7.6  HGB 10.5*  HCT 35.3*  MCV 90.3  PLT 227   Cardiac Enzymes: No results for input(s): CKTOTAL, CKMB, CKMBINDEX, TROPONINI in the last 72 hours. BNP: Invalid input(s): POCBNP D-Dimer: No results for input(s): DDIMER in the last 72 hours. Hemoglobin A1C:  Recent Labs  09/14/15 1654  HGBA1C 6.4*   Fasting Lipid Panel: No results for input(s): CHOL, HDL, LDLCALC, TRIG, CHOLHDL, LDLDIRECT in the last 72 hours. Thyroid Function Tests:  Recent Labs  09/14/15 1654  TSH 0.087*   Anemia Panel: No results for input(s): VITAMINB12, FOLATE, FERRITIN, TIBC, IRON, RETICCTPCT in the last 72 hours.  RADIOLOGY: Dg Chest 2 View  Result  Date: 08/16/2015 CLINICAL DATA:  Shortness of breath. Unresolved pneumonia. Right pleural effusion and pleural catheter. EXAM: CHEST  2 VIEW COMPARISON:  07/23/2015 FINDINGS: Mild cardiomegaly remains stable. Pulmonary vascular congestion is seen, without evidence of frank pulmonary edema or pulmonary consolidation. Prior CABG again noted. Right pleural drain again seen along the right lung base. Stable tiny right pleural effusion versus pleural thickening. No pneumothorax visualized. IMPRESSION: Stable tiny right pleural effusion versus pleural thickening, with right pleural drain remaining in place. Stable mild cardiomegaly and pulmonary vascular congestion. No evidence of frank pulmonary edema or consolidation. Electronically Signed   By: Earle Gell M.D.   On: 08/16/2015 09:59    PHYSICAL EXAM General: NAD Neck: JVP 14+, no thyromegaly or thyroid nodule.  Lungs: Clear to auscultation bilaterally with normal respiratory effort. CV: Nondisplaced PMI.  Heart regular S1/S2, no S3/S4, 2/6 HSM LLSB/apex.  1+ edema to knees.    Abdomen: Soft, nontender, no hepatosplenomegaly, no distention.  Neurologic: Alert and oriented x 3.  Psych: Normal affect. Extremities: No clubbing or cyanosis.   TELEMETRY: Reviewed telemetry pt in NSR  ASSESSMENT AND PLAN: 70 yo with chronic diastolic CHF, CAD s/p CABG, PAD, CKD, recurrent right pleural effusion presented with acute/chronic diastolic CHF.  1. Acute on chronic diastolic CHF: I suspect that there is a significant component of RV failure as well. 15 lb weight gain over the week prior to admission. She is significantly volume overloaded on exam. Diuresis is complicated by CKD stage III, suspect cardiorenal syndrome.  It may be that by lowering renal venous pressure, her creatinine will improve => creatinine down today.  - Continue Lasix 80 mg IV every 8 hrs. Monitor creatinine closely.  - If creatinine remains stable,consider addition of spironolactone given TOPCAT trial data.  - RHC tomorrow to assess filling pressures and also PA pressure.  Concern for pulmonary hypertension, venous versus arterial? 2. CKD stage III: Follow creatinine closely with diuresis, lower today.  3. CAD: s/p CABG. No chest pain. H/o PCI to SVG-D in 9/15.  - Continue ASA 81, Plavix, statin.  4. Recurrent right pleural effusion: Right Pleurx catheter no longer draining. CXR shows minimal right effusion versus pleural thickening. Will remove Pleurx catheter today. 5. Suspect OSA: Will need sleep study as outpatient.  6. PAD: ABI 0.5 in right foot. Needs followup with Dr. Gwenlyn Found after discharge.   Loralie Champagne 09/15/2015 7:24 AM

## 2015-09-15 NOTE — Progress Notes (Signed)
  Request seen for elective removal of tunneled pleural catheter.  Chart reviewed. Patient admitted for acute on chronic diastolic heart failure.  Per notes, pleural catheter not draining much anymore.   Patient is on Plavix  Per our anticoagulation guidelines, Plavix should be held x 5 days for removal of a tunneled catheter.  Discussed this with Dr. Aundra Dubin and he is aware.  Dr. Ronny Bacon also aware.  Matei Magnone S Steffan Caniglia PA-C 09/15/2015 3:58 PM

## 2015-09-15 NOTE — Progress Notes (Signed)
CARDIAC REHAB PHASE I   PRE:  Rate/Rhythm: 52 SB  BP:  Supine: 163/51  Sitting:   Standing:    SaO2: 90% 2L  MODE:  Ambulation: 150 ft   POST:  Rate/Rhythm: 81  BP:  Supine:   Sitting: 180/33  Standing:    SaO2: 71% 2L,, 81% 3L, 90% 4L   After rest, back to 2L 92% 1125-1210 Pt cleaned up by nurse after urinary incontinence. Pt walked 150 ft on 2L with gait belt use, rolling walker and asst x 1. Pt tired by end of walk and stated did not feel SOB until back in room. Sats were low after walk at 71% and took 4L to get sats to 90%. Pt rested a few minutes and then able to put back to 2L. Call bell in reach. ,   Graylon Good, RN BSN  09/15/2015 12:04 PM

## 2015-09-15 NOTE — Progress Notes (Signed)
PT Cancellation Note  Patient Details Name: [name] [name] MRN: [id number] DOB: Sep 09, 1945   Cancelled Treatment:    Reason Eval/Treat Not Completed: Patient at procedure or test/unavailable   On arrival, Cardiac Rehab present and pt had just returned from a walk. She had desturated on 3L oxygen and was still recovering on 4L unable to tolerate additional activity at this time.    Atsushi Yom 09/15/2015, 11:53 AM  Pager [phone]

## 2015-09-16 ENCOUNTER — Encounter (HOSPITAL_COMMUNITY)
Admission: AD | Disposition: A | Discharge status: Home Health | Payer: Self-pay | Source: Ambulatory Visit | Attending: Cardiology

## 2015-09-16 ENCOUNTER — Inpatient Hospital Stay (HOSPITAL_COMMUNITY): Payer: Medicare Other

## 2015-09-16 ENCOUNTER — Encounter (HOSPITAL_COMMUNITY): Payer: Self-pay | Admitting: Cardiology

## 2015-09-16 DIAGNOSIS — I509 Heart failure, unspecified: Secondary | ICD-10-CM

## 2015-09-16 HISTORY — PX: CARDIAC CATHETERIZATION: SHX172

## 2015-09-16 LAB — POCT I-STAT 3, VENOUS BLOOD GAS (G3P V)
ACID-BASE EXCESS: 7 mmol/L — AB (ref 0.0–2.0)
ACID-BASE EXCESS: 8 mmol/L — AB (ref 0.0–2.0)
Bicarbonate: 32.5 mmol/L — ABNORMAL HIGH (ref 20.0–28.0)
Bicarbonate: 33.6 mmol/L — ABNORMAL HIGH (ref 20.0–28.0)
O2 SAT: 60 %
O2 Saturation: 51 %
PH VEN: 7.435 — AB (ref 7.250–7.430)
PH VEN: 7.438 — AB (ref 7.250–7.430)
TCO2: 34 mmol/L (ref 0–100)
TCO2: 35 mmol/L (ref 0–100)
pCO2, Ven: 48.1 mmHg (ref 44.0–60.0)
pCO2, Ven: 50 mmHg (ref 44.0–60.0)
pO2, Ven: 27 mmHg — CL (ref 32.0–45.0)
pO2, Ven: 31 mmHg — CL (ref 32.0–45.0)

## 2015-09-16 LAB — CBC
HCT: 31.8 % — ABNORMAL LOW (ref 36.0–46.0)
HEMOGLOBIN: 9.3 g/dL — AB (ref 12.0–15.0)
MCH: 26.4 pg (ref 26.0–34.0)
MCHC: 29.2 g/dL — ABNORMAL LOW (ref 30.0–36.0)
MCV: 90.3 fL (ref 78.0–100.0)
Platelets: 213 10*3/uL (ref 150–400)
RBC: 3.52 MIL/uL — AB (ref 3.87–5.11)
RDW: 17.1 % — ABNORMAL HIGH (ref 11.5–15.5)
WBC: 7.7 10*3/uL (ref 4.0–10.5)

## 2015-09-16 LAB — BASIC METABOLIC PANEL
ANION GAP: 9 (ref 5–15)
BUN: 66 mg/dL — ABNORMAL HIGH (ref 6–20)
CALCIUM: 8.8 mg/dL — AB (ref 8.9–10.3)
CO2: 31 mmol/L (ref 22–32)
Chloride: 100 mmol/L — ABNORMAL LOW (ref 101–111)
Creatinine, Ser: 1.65 mg/dL — ABNORMAL HIGH (ref 0.44–1.00)
GFR, EST AFRICAN AMERICAN: 36 mL/min — AB (ref >60)
GFR, EST NON AFRICAN AMERICAN: 31 mL/min — AB (ref >60)
Glucose, Bld: 148 mg/dL — ABNORMAL HIGH (ref 65–99)
Potassium: 3.7 mmol/L (ref 3.5–5.1)
SODIUM: 140 mmol/L (ref 135–145)

## 2015-09-16 LAB — GLUCOSE, CAPILLARY
GLUCOSE-CAPILLARY: 180 mg/dL — AB (ref 65–99)
GLUCOSE-CAPILLARY: 254 mg/dL — AB (ref 65–99)
GLUCOSE-CAPILLARY: 192 mg/dL — AB (ref 65–99)
Glucose-Capillary: 148 mg/dL — ABNORMAL HIGH (ref 65–99)

## 2015-09-16 LAB — PROTIME-INR
INR: 1.39
PROTHROMBIN TIME: 17.2 s — AB (ref 11.4–15.2)

## 2015-09-16 LAB — T3, FREE: T3, Free: 3.1 pg/mL (ref 2.0–4.4)

## 2015-09-16 SURGERY — RIGHT HEART CATH
Anesthesia: LOCAL

## 2015-09-16 MED ORDER — HEPARIN SODIUM (PORCINE) 5000 UNIT/ML IJ SOLN
5000.0000 [IU] | Freq: Three times a day (TID) | INTRAMUSCULAR | Status: DC
  Administered 2015-09-16 – 2015-09-20 (×11): 5000 [IU] via SUBCUTANEOUS
  Filled 2015-09-16 (×11): qty 1

## 2015-09-16 MED ORDER — ONDANSETRON HCL 4 MG/2ML IJ SOLN: 4.0000 mg | Freq: Four times a day (QID) | INTRAMUSCULAR | Status: DC | PRN

## 2015-09-16 MED ORDER — SODIUM CHLORIDE 0.9 % IV SOLN: 250.0000 mL | INTRAVENOUS | Status: DC | PRN

## 2015-09-16 MED ORDER — SODIUM CHLORIDE 0.9% FLUSH: 3.0000 mL | INTRAVENOUS | Status: DC | PRN

## 2015-09-16 MED ORDER — FUROSEMIDE 10 MG/ML IJ SOLN
80.0000 mg | Freq: Three times a day (TID) | INTRAMUSCULAR | Status: DC
  Administered 2015-09-16 – 2015-09-19 (×9): 80 mg via INTRAVENOUS
  Filled 2015-09-16 (×8): qty 8

## 2015-09-16 MED ORDER — SODIUM CHLORIDE 0.9% FLUSH
3.0000 mL | Freq: Two times a day (BID) | INTRAVENOUS | Status: DC
  Administered 2015-09-16 – 2015-09-21 (×4): 3 mL via INTRAVENOUS

## 2015-09-16 MED ORDER — HYDRALAZINE HCL 50 MG PO TABS
50.0000 mg | ORAL_TABLET | Freq: Three times a day (TID) | ORAL | Status: DC
  Administered 2015-09-16 – 2015-09-21 (×15): 50 mg via ORAL
  Filled 2015-09-16 (×14): qty 1

## 2015-09-16 MED ORDER — LIDOCAINE HCL (PF) 1 % IJ SOLN
INTRAMUSCULAR | Status: AC
  Filled 2015-09-16: qty 30

## 2015-09-16 MED ORDER — SODIUM CHLORIDE 0.9 % IV SOLN
INTRAVENOUS | Status: DC
  Administered 2015-09-16: 07:00:00 via INTRAVENOUS

## 2015-09-16 MED ORDER — MORPHINE SULFATE (PF) 2 MG/ML IV SOLN
1.0000 mg | INTRAVENOUS | Status: DC | PRN
  Administered 2015-09-16 – 2015-09-19 (×7): 2 mg via INTRAVENOUS
  Filled 2015-09-16 (×9): qty 1

## 2015-09-16 MED ORDER — SODIUM CHLORIDE 0.9% FLUSH
3.0000 mL | Freq: Two times a day (BID) | INTRAVENOUS | Status: DC
  Administered 2015-09-16 (×2): 3 mL via INTRAVENOUS

## 2015-09-16 MED ORDER — ACETAMINOPHEN 325 MG PO TABS: 650.0000 mg | ORAL_TABLET | ORAL | Status: DC | PRN

## 2015-09-16 MED ORDER — SODIUM CHLORIDE 0.9% FLUSH
3.0000 mL | Freq: Two times a day (BID) | INTRAVENOUS | Status: DC
  Administered 2015-09-16 – 2015-09-21 (×9): 3 mL via INTRAVENOUS

## 2015-09-16 MED ORDER — INSULIN ASPART 100 UNIT/ML ~~LOC~~ SOLN
3.0000 [IU] | Freq: Three times a day (TID) | SUBCUTANEOUS | Status: DC
  Administered 2015-09-17 – 2015-09-21 (×13): 3 [IU] via SUBCUTANEOUS

## 2015-09-16 MED ORDER — METHIMAZOLE 10 MG PO TABS
10.0000 mg | ORAL_TABLET | Freq: Every day | ORAL | Status: DC
  Administered 2015-09-16 – 2015-09-21 (×6): 10 mg via ORAL
  Filled 2015-09-16 (×6): qty 1

## 2015-09-16 MED ORDER — LIDOCAINE HCL (PF) 1 % IJ SOLN
INTRAMUSCULAR | Status: DC | PRN
  Administered 2015-09-16: 2 mL

## 2015-09-16 MED ORDER — SPIRONOLACTONE 25 MG PO TABS
12.5000 mg | ORAL_TABLET | Freq: Every day | ORAL | Status: DC
Start: 2015-09-16 — End: 2015-09-21
  Administered 2015-09-16 – 2015-09-21 (×6): 12.5 mg via ORAL
  Filled 2015-09-16 (×6): qty 1

## 2015-09-16 MED ORDER — HEPARIN (PORCINE) IN NACL 2-0.9 UNIT/ML-% IJ SOLN
INTRAMUSCULAR | Status: AC
  Filled 2015-09-16: qty 500

## 2015-09-16 MED ORDER — HEPARIN (PORCINE) IN NACL 2-0.9 UNIT/ML-% IJ SOLN
INTRAMUSCULAR | Status: DC | PRN
  Administered 2015-09-16: 500 mL

## 2015-09-16 SURGICAL SUPPLY — 10 items
CATH BALLN WEDGE 5F 110CM (CATHETERS) ×1 IMPLANT
HOVERMATT SINGLE USE (MISCELLANEOUS) ×1 IMPLANT
PACK CARDIAC CATHETERIZATION (CUSTOM PROCEDURE TRAY) ×2 IMPLANT
PROTECTION STATION PRESSURIZED (MISCELLANEOUS) ×2
SHEATH FAST CATH BRACH 5F 5CM (SHEATH) ×1 IMPLANT
STATION PROTECTION PRESSURIZED (MISCELLANEOUS) IMPLANT
TRANSDUCER W/STOPCOCK (MISCELLANEOUS) ×3 IMPLANT
TUBING ART PRESS 72  MALE/FEM (TUBING) ×1
TUBING ART PRESS 72 MALE/FEM (TUBING) IMPLANT
WIRE EMERALD 3MM-J .025X260CM (WIRE) ×1 IMPLANT

## 2015-09-16 NOTE — Progress Notes (Signed)
Called by nursing staff. O2 sats dropped to 72% on room air. Now on 5 liters with O2 sats 88-90%.   Walked in the room and she was sitting on the side of bed leaning forward. Complaining of hip pain. CXR now. Give 2 mg morphine now. SBP 160s. Lungs with crackles. Give afternoon dose of lasix now. Give afternoon hydralazine dose now.    Dr Aundra Dubin aware and agrees with plan.   Liboria Putnam  NP-C  2:30 PM

## 2015-09-16 NOTE — Evaluation (Signed)
Physical Therapy Evaluation Patient Details Name: [name] [name] MRN: [id number] DOB: 1945-10-14 Today's Date: 09/16/2015   History of Present Illness  70 yo with history of chronic diastolic CHF, CAD s/p CABG, chronic right pleural effusion, CKD stage III, and PAD admitted with acute CHF exacerbation.   Clinical Impression  Patient presents with decreased independence with mobility due to deficits listed in PT problem list.  She will benefit from skilled PT in the acute setting to allow return home with family support and follow up HHPT.     Follow Up Recommendations Home health PT    Equipment Recommendations  None recommended by PT    Recommendations for Other Services       Precautions / Restrictions Precautions Precautions: Fall Precaution Comments: watch O2 sats; fell last weekend at home when lifted hands from support after throwing away trash      Mobility  Bed Mobility Overal bed mobility: Needs Assistance Bed Mobility: Supine to Sit     Supine to sit: Min guard     General bed mobility comments: for stabilizing trunk  Transfers Overall transfer level: Needs assistance Equipment used: Rolling walker (2 wheeled) Transfers: Sit to/from Stand Sit to Stand: Min guard         General transfer comment: steadying assist  Ambulation/Gait Ambulation/Gait assistance: Supervision;Min guard Ambulation Distance (Feet): 150 Feet Assistive device: Rolling walker (2 wheeled) Gait Pattern/deviations: Step-through pattern;Decreased stride length;Wide base of support     General Gait Details: stable with walker during ambulation, fatigues quickly  Stairs            Wheelchair Mobility    Modified Rankin (Stroke Patients Only)       Balance Overall balance assessment: Needs assistance         Standing balance support: Bilateral upper extremity supported Standing balance-Leahy Scale: Poor Standing balance comment: reliant on UE support for balance                              Pertinent Vitals/Pain Pain Assessment: 0-10 Pain Score: 6  Pain Location: L shoulder down to side Pain Descriptors / Indicators: Sore Pain Intervention(s): Monitored during session;Repositioned    Home Living Family/patient expects to be discharged to:: Private residence Living Arrangements: Children Available Help at Discharge: Family;Available 24 hours/day Type of Home: House Home Access: Stairs to enter Entrance Stairs-Rails: None (someone putting up a rail) Entrance Stairs-Number of Steps: 3-4 Home Layout: One level Home Equipment: Walker - 2 wheels;Walker - 4 wheels;Cane - single point;Shower seat;Bedside commode;Grab bars - tub/shower Additional Comments: grandaughter going to stay with patient     Prior Function Level of Independence: Independent with assistive device(s);Needs assistance      ADL's / Homemaking Assistance Needed: daughter helps bathe        Hand Dominance   Dominant Hand: Right    Extremity/Trunk Assessment   Upper Extremity Assessment: Overall WFL for tasks assessed (but sore on L UE)           Lower Extremity Assessment: Generalized weakness      Cervical / Trunk Assessment: Kyphotic  Communication   Communication: No difficulties  Cognition                            General Comments General comments (skin integrity, edema, etc.): SpO2 88-91% at rest on 4L O2; 85% after ambulation on 4L O2. back up to  93% after 1 minute rest    Exercises        Assessment/Plan    PT Assessment Patient needs continued PT services  PT Diagnosis Generalized weakness   PT Problem List Decreased strength;Decreased activity tolerance;Decreased balance;Cardiopulmonary status limiting activity;Decreased mobility  PT Treatment Interventions     PT Goals (Current goals can be found in the Care Plan section) Acute Rehab PT Goals Patient Stated Goal: To go home PT Goal Formulation: With patient Time For  Goal Achievement: 09/23/15 Potential to Achieve Goals: Good    Frequency Min 3X/week   Barriers to discharge        Co-evaluation               End of Session Equipment Utilized During Treatment: Gait belt;Oxygen Activity Tolerance: Patient limited by fatigue Patient left: in bed;with call bell/phone within reach           Time: 1125-1149 PT Time Calculation (min) (ACUTE ONLY): 24 min   Charges:   PT Evaluation $PT Eval Moderate Complexity: 1 Procedure PT Treatments $Gait Training: 8-22 mins   PT G Codes:        Reginia Naas 10/13/15, 2:54 PM  Magda Kiel, [place] Oct 13, 2015

## 2015-09-16 NOTE — Progress Notes (Signed)
Advanced Heart Failure Rounding Note   Subjective:    Admitted with marked volume overload. Diuresing with IV lasix.     Objective:   Weight Range:  Vital Signs:   Temp:  [98 F (36.7 C)-98.6 F (37 C)] 98.4 F (36.9 C) (09/07 0744) Pulse Rate:  [54-62] 54 (09/07 0744) Resp:  [16-24] 16 (09/07 0744) BP: (116-169)/(38-60) 144/42 (09/07 0744) SpO2:  [88 %-94 %] 88 % (09/07 0744) Weight:  [200 lb 1.6 oz (90.8 kg)] 200 lb 1.6 oz (90.8 kg) (09/07 0353) Last BM Date: 09/12/15  Weight change: Filed Weights   09/14/15 1618 09/15/15 0607 09/16/15 0353  Weight: 211 lb 4.8 oz (95.8 kg) 207 lb 8 oz (94.1 kg) 200 lb 1.6 oz (90.8 kg)    Intake/Output:   Intake/Output Summary (Last 24 hours) at 09/16/15 0759 Last data filed at 09/16/15 0700  Gross per 24 hour  Intake              720 ml  Output             3130 ml  Net            -2410 ml     Physical Exam: General:  Well appearing. No resp difficulty HEENT: normal Neck: supple. JVP 10+. Carotids 2+ bilat; no bruits. No lymphadenopathy or thryomegaly appreciated. Cor: PMI nondisplaced. Regular rate & rhythm. No rubs, gallops or murmurs. Lungs: clear Abdomen: soft, nontender, nondistended. No hepatosplenomegaly. No bruits or masses. Good bowel sounds. Extremities: no cyanosis, clubbing, rash.  1+ ankle edema.  Neuro: alert & orientedx3, cranial nerves grossly intact. moves all 4 extremities w/o difficulty. Affect pleasant  Telemetry: NSR  Labs: Basic Metabolic Panel:  Recent Labs Lab 09/14/15 1654 09/15/15 0233 09/16/15 0212  NA 135 137 140  K 4.4 4.5 3.7  CL 98* 96* 100*  CO2 _0 GLUCOSE 147* 131* 148*  BUN 81* 76* 66*  CREATININE 1.99* 1.89* 1.65*  CALCIUM 9.0 8.5* 8.8*  MG 3.1*  --   --     Liver Function Tests:  Recent Labs Lab 09/14/15 1654  AST 20  ALT 18  ALKPHOS 82  BILITOT 0.9  PROT 7.8  ALBUMIN 3.8   No results for input(s): LIPASE, AMYLASE in the last 168 hours. No results for  input(s): AMMONIA in the last 168 hours.  CBC:  Recent Labs Lab 09/14/15 1654 09/16/15 0212  WBC 9.3 7.7  NEUTROABS 7.6  --   HGB 10.5* 9.3*  HCT 35.3* 31.8*  MCV 90.3 90.3  PLT 227 213    Cardiac Enzymes: No results for input(s): CKTOTAL, CKMB, CKMBINDEX, TROPONINI in the last 168 hours.  BNP: BNP (last 3 results)  Recent Labs  10/10/14 2153 07/23/15 1629 09/14/15 1654  BNP 914.0* 654.0* 637.9*    ProBNP (last 3 results) No results for input(s): PROBNP in the last 8760 hours.    Other results:  Imaging: Dg Chest 2 View  Result Date: 09/15/2015 CLINICAL DATA:  Shortness of breath/dyspnea and edema for 3 days. EXAM: CHEST  2 VIEW COMPARISON:  08/16/2015 FINDINGS: Stable changes from previous CABG surgery. The cardiac silhouette is mildly enlarged. No mediastinal or hilar masses or evidence of adenopathy. There is vascular congestion with interstitial thickening, the latter most prominent in the lower lungs. Pleural-based opacities noted along the right lateral mid to lower lung. Appearance of the lungs is similar to the prior exam. There are small bilateral pleural effusions, new or increased  when compared to the prior chest radiographs. Pleural drain curls along the dome of the right hemidiaphragm, unchanged. No pneumothorax. Skeletal structures are demineralized but grossly intact. IMPRESSION: 1. Findings support mild congestive heart failure. Small pleural effusions new or increased when compared the prior exam. No other change. Electronically Signed   By: Lajean Manes M.D.   On: 09/15/2015 09:11     Medications:     Scheduled Medications: . amLODipine  10 mg Oral Daily  . umeclidinium bromide  1 puff Inhalation Daily   And  . arformoterol  15 mcg Nebulization BID  . aspirin EC  81 mg Oral Daily  . atorvastatin  40 mg Oral q1800  . citalopram  40 mg Oral Daily  . enoxaparin (LOVENOX) injection  40 mg Subcutaneous Q24H  . furosemide  80 mg Intravenous Q8H  .  gabapentin  300 mg Oral QHS  . hydrALAZINE  50 mg Oral TID  . insulin aspart  0-15 Units Subcutaneous TID WC  . insulin aspart  3 Units Subcutaneous TID WC  . isosorbide mononitrate  30 mg Oral Daily  . losartan  12.5 mg Oral QHS  . methimazole  10 mg Oral Daily  . multivitamin with minerals  1 tablet Oral Daily  . sodium chloride flush  3 mL Intravenous Q12H  . sodium chloride flush  3 mL Intravenous Q12H  . spironolactone  12.5 mg Oral Daily    Infusions: . [START ON 09/17/2015] sodium chloride 10 mL/hr at 09/16/15 0645    PRN Medications: sodium chloride, sodium chloride, acetaminophen, albuterol, docusate sodium, HYDROcodone-acetaminophen, nitroGLYCERIN, ondansetron (ZOFRAN) IV, sodium chloride flush, sodium chloride flush   Assessment/Plan  1. Acute on chronic diastolic CHF: I suspect that there is a significant component of RV failure as well. 15 lb weight gain over the last week or so. She is significantly volume overloaded on exam. Diuresis is complicated by CKD stage III, suspect cardiorenal syndrome.  With her degree of volume overload, she was admitted for diuresis and careful renal function monitoring. She diuresed well yesterday, weight down.  Still appears volume overloaded. - Continue Lasix 80 mg IV every 8 hrs. Adjust diuretics post RHC. Monitor creatinine closely.  - RHC this morning to assess PA pressure and filling pressures.  2. CKD stage III: Follow creatinine closely with diuresis. Lower today.  3. CAD: s/p CABG. No chest pain. H/o PCI to SVG-D in 9/15.  - Continue ASA 81, statin. - Holding Plavix so Pleurx can be removed. 4. Recurrent right pleural effusion: Right Pleurx catheter no longer draining. CXR PA/lat: Pleural effusion resolved.  Stop plavix for 5 days then remove outpatient.  5. Suspect OSA: Will need sleep study as outpatient.  6. PAD: ABI 0.5 in right foot. Needs followup with Dr. Gwenlyn Found after discharge.  7. Hyperthyroidism: Start on methimazole.  Will need to refer to endocrinology.   Plan for RHC today.   Length of Stay: 2   Amy Clegg 09/16/2015, 7:59 AM  Advanced Heart Failure Team Pager [phone] (M-F; 7a - 4p)  Please contact [facility] for night-coverage after hours (4p -7a ) and weekends on [email or web address]  Patient seen with NP, agree with the above note.  She diuresed well but is still volume overloaded.   - Continue Lasix IV - Hold Plavix for 5 days to remove Pleurx.  - Start methimazole for hyperthyroidism.  - RHC today, discussed with patient.   Loralie Champagne 09/16/2015 9:29 AM

## 2015-09-16 NOTE — Progress Notes (Signed)
Pt requiring increased O2 this pm, SAO2 in 70s. Will hold for now. Yves Dill CES, ACSM 3:09 PM 09/16/2015

## 2015-09-16 NOTE — Progress Notes (Signed)
Chart reviewed  It appears Plavix is being held  Can proceed with removal of tunneled pleural catheter on Tuesday, 9/12.  Bryse Blanchette S Roney Youtz PA-C 09/16/2015 11:18 AM

## 2015-09-16 NOTE — Progress Notes (Signed)
Patient has been having oxygen saturation between 88-90% on 5L since heart cath this morning.  Patient doesn't feel short of breath and can talk in sentences.  When she took her oxygen off to blow her nose, she desaturated to 72%.  Darrick Grinder, NP paged. New orders received.  Will continue to monitor.

## 2015-09-17 LAB — BASIC METABOLIC PANEL
Anion gap: 8 (ref 5–15)
BUN: 55 mg/dL — AB (ref 6–20)
CALCIUM: 9.3 mg/dL (ref 8.9–10.3)
CO2: 34 mmol/L — AB (ref 22–32)
CREATININE: 1.57 mg/dL — AB (ref 0.44–1.00)
Chloride: 99 mmol/L — ABNORMAL LOW (ref 101–111)
GFR calc non Af Amer: 33 mL/min — ABNORMAL LOW (ref >60)
GFR, EST AFRICAN AMERICAN: 38 mL/min — AB (ref >60)
Glucose, Bld: 147 mg/dL — ABNORMAL HIGH (ref 65–99)
Potassium: 4.7 mmol/L (ref 3.5–5.1)
Sodium: 141 mmol/L (ref 135–145)

## 2015-09-17 LAB — CBC
HCT: 34.4 % — ABNORMAL LOW (ref 36.0–46.0)
Hemoglobin: 9.8 g/dL — ABNORMAL LOW (ref 12.0–15.0)
MCH: 26.2 pg (ref 26.0–34.0)
MCHC: 28.5 g/dL — AB (ref 30.0–36.0)
MCV: 92 fL (ref 78.0–100.0)
Platelets: 216 10*3/uL (ref 150–400)
RBC: 3.74 MIL/uL — AB (ref 3.87–5.11)
RDW: 17.1 % — AB (ref 11.5–15.5)
WBC: 7.8 10*3/uL (ref 4.0–10.5)

## 2015-09-17 LAB — GLUCOSE, CAPILLARY
GLUCOSE-CAPILLARY: 135 mg/dL — AB (ref 65–99)
GLUCOSE-CAPILLARY: 195 mg/dL — AB (ref 65–99)
Glucose-Capillary: 190 mg/dL — ABNORMAL HIGH (ref 65–99)
Glucose-Capillary: 246 mg/dL — ABNORMAL HIGH (ref 65–99)

## 2015-09-17 MED ORDER — METOLAZONE 2.5 MG PO TABS
2.5000 mg | ORAL_TABLET | Freq: Once | ORAL | Status: AC
  Administered 2015-09-17: 2.5 mg via ORAL
  Filled 2015-09-17: qty 1

## 2015-09-17 NOTE — Progress Notes (Addendum)
Patient ID: [name] [name], female   DOB: 1945-07-16, 70 y.o.   MRN: [id number]    Advanced Heart Failure Rounding Note   Subjective:    Admitted with marked volume overload. Diuresing with IV lasix. I/O not recorded but weight down 3 lbs.  Low oxygen saturation with movement yesterday.  Denies dyspnea.   RHC Procedural Findings (9/7): Hemodynamics (mmHg) RA mean 15 RV 76/18 PA 73/26, mean 42 PCWP mean 25 Oxygen saturations: PA 56% AO 93% Cardiac Output (Fick) 5.68  Cardiac Index (Fick) 2.84 PVR 3.0 WU  Objective:   Weight Range:  Vital Signs:   Temp:  [98.1 F (36.7 C)-98.7 F (37.1 C)] 98.7 F (37.1 C) (09/08 0544) Pulse Rate:  [53-66] 57 (09/08 0544) Resp:  [4-21] 20 (09/08 0544) BP: (134-166)/(35-72) 149/43 (09/08 0544) SpO2:  [83 %-96 %] 94 % (09/08 0544) Weight:  [197 lb 1.6 oz (89.4 kg)] 197 lb 1.6 oz (89.4 kg) (09/08 0242) Last BM Date: 09/15/15  Weight change: Filed Weights   09/15/15 0607 09/16/15 0353 09/17/15 0242  Weight: 207 lb 8 oz (94.1 kg) 200 lb 1.6 oz (90.8 kg) 197 lb 1.6 oz (89.4 kg)    Intake/Output:   Intake/Output Summary (Last 24 hours) at 09/17/15 0845 Last data filed at 09/17/15 0636  Gross per 24 hour  Intake              460 ml  Output              825 ml  Net             -365 ml     Physical Exam: General:  Well appearing. No resp difficulty HEENT: normal Neck: supple. JVP 14. Carotids 2+ bilat; no bruits. No lymphadenopathy or thryomegaly appreciated. Cor: PMI nondisplaced. Regular rate & rhythm. No rubs, gallops or murmurs. Lungs: clear Abdomen: soft, nontender, nondistended. No hepatosplenomegaly. No bruits or masses. Good bowel sounds. Extremities: no cyanosis, clubbing, rash.  1+ ankle edema.  Neuro: alert & orientedx3, cranial nerves grossly intact. moves all 4 extremities w/o difficulty. Affect pleasant  Telemetry: NSR  Labs: Basic Metabolic Panel:  Recent Labs Lab 09/14/15 1654 09/15/15 0233 09/16/15 0212  09/17/15 0349  NA 135 137 140 141  K 4.4 4.5 3.7 4.7  CL 98* 96* 100* 99*  CO2 _0 34*  GLUCOSE 147* 131* 148* 147*  BUN 81* 76* 66* 55*  CREATININE 1.99* 1.89* 1.65* 1.57*  CALCIUM 9.0 8.5* 8.8* 9.3  MG 3.1*  --   --   --     Liver Function Tests:  Recent Labs Lab 09/14/15 1654  AST 20  ALT 18  ALKPHOS 82  BILITOT 0.9  PROT 7.8  ALBUMIN 3.8   No results for input(s): LIPASE, AMYLASE in the last 168 hours. No results for input(s): AMMONIA in the last 168 hours.  CBC:  Recent Labs Lab 09/14/15 1654 09/16/15 0212 09/17/15 0349  WBC 9.3 7.7 7.8  NEUTROABS 7.6  --   --   HGB 10.5* 9.3* 9.8*  HCT 35.3* 31.8* 34.4*  MCV 90.3 90.3 92.0  PLT 227 213 216    Cardiac Enzymes: No results for input(s): CKTOTAL, CKMB, CKMBINDEX, TROPONINI in the last 168 hours.  BNP: BNP (last 3 results)  Recent Labs  10/10/14 2153 07/23/15 1629 09/14/15 1654  BNP 914.0* 654.0* 637.9*    ProBNP (last 3 results) No results for input(s): PROBNP in the last 8760 hours.    Other results:  Imaging: Dg Chest 2 View  Result Date: 09/15/2015 CLINICAL DATA:  Shortness of breath/dyspnea and edema for 3 days. EXAM: CHEST  2 VIEW COMPARISON:  08/16/2015 FINDINGS: Stable changes from previous CABG surgery. The cardiac silhouette is mildly enlarged. No mediastinal or hilar masses or evidence of adenopathy. There is vascular congestion with interstitial thickening, the latter most prominent in the lower lungs. Pleural-based opacities noted along the right lateral mid to lower lung. Appearance of the lungs is similar to the prior exam. There are small bilateral pleural effusions, new or increased when compared to the prior chest radiographs. Pleural drain curls along the dome of the right hemidiaphragm, unchanged. No pneumothorax. Skeletal structures are demineralized but grossly intact. IMPRESSION: 1. Findings support mild congestive heart failure. Small pleural effusions new or increased  when compared the prior exam. No other change. Electronically Signed   By: Lajean Manes M.D.   On: 09/15/2015 09:11   Dg Chest Port 1 View  Result Date: 09/16/2015 CLINICAL DATA:  Hypoxia, history of CHF, coronary artery disease with CABG, hypertension, diabetes, former very heavy smoker discontinued in 2011. EXAM: PORTABLE CHEST 1 VIEW COMPARISON:  PA and lateral chest x-ray of August 15, 2015 FINDINGS: The lungs are adequately inflated. The interstitial markings are increased bilaterally. There is a small amount of pleural fluid versus pleural thickening on the right. The cardiac silhouette is enlarged. The pulmonary vascularity is mildly engorged. There is calcification in the wall of the aortic arch. The patient has undergone previous CABG. IMPRESSION: CHF with mild pulmonary interstitial edema. Underlying COPD. Small right pleural effusion versus pleural thickening. Electronically Signed   By: David  Martinique M.D.   On: 09/16/2015 14:53     Medications:     Scheduled Medications: . amLODipine  10 mg Oral Daily  . umeclidinium bromide  1 puff Inhalation Daily   And  . arformoterol  15 mcg Nebulization BID  . aspirin EC  81 mg Oral Daily  . atorvastatin  40 mg Oral q1800  . citalopram  40 mg Oral Daily  . furosemide  80 mg Intravenous Q8H  . gabapentin  300 mg Oral QHS  . heparin  5,000 Units Subcutaneous Q8H  . hydrALAZINE  50 mg Oral TID  . insulin aspart  0-15 Units Subcutaneous TID WC  . insulin aspart  3 Units Subcutaneous TID WC  . isosorbide mononitrate  30 mg Oral Daily  . losartan  12.5 mg Oral QHS  . methimazole  10 mg Oral Daily  . metolazone  2.5 mg Oral Once  . multivitamin with minerals  1 tablet Oral Daily  . sodium chloride flush  3 mL Intravenous Q12H  . sodium chloride flush  3 mL Intravenous Q12H  . sodium chloride flush  3 mL Intravenous Q12H  . spironolactone  12.5 mg Oral Daily    Infusions:    PRN Medications: sodium chloride, sodium chloride, sodium  chloride, acetaminophen, albuterol, docusate sodium, HYDROcodone-acetaminophen, morphine injection, nitroGLYCERIN, ondansetron (ZOFRAN) IV, sodium chloride flush, sodium chloride flush, sodium chloride flush   Assessment/Plan   1. Acute on chronic diastolic CHF: I suspect that there is a significant component of RV failure as well. Echo 7/17 with moderate LVH, EF 55-60%, D-shaped septum, PASP 52 mmHg.  15 lb weight gain over the last week or so prior to admission. 9/7 RHC showed ongoing volume overload with primarily pulmonary venous hypertension.  She is significantly volume overloaded on exam. Diuresis is complicated by CKD stage III, suspect cardiorenal  syndrome.  On exam today, still volume overloaded and hypoxemic yesterday with movement despite nasal cannula.  I/Os not recorded but weight down another 3 lbs.  Creatinine coming down. - Continue Lasix 80 mg IV every 8 hrs. Will give metolazone 2.5 mg x 1 today.  2. CKD stage III: Follow creatinine closely with diuresis. Lower today.  3. CAD: s/p CABG. No chest pain. H/o PCI to SVG-D in 9/15.  - Continue ASA 81, statin. - Holding Plavix so Pleurx can be removed => needs 5 days off Plavix. 4. Recurrent right pleural effusion: Right Pleurx catheter no longer draining. CXR PA/lat: Pleural effusion resolved.  Stop plavix for 5 days then remove outpatient most likely.  5. Suspect OSA: Will need sleep study as outpatient.  6. PAD: ABI 0.5 in right foot. Needs followup with Dr. Gwenlyn Found after discharge.  7. Hyperthyroidism: Start on methimazole. Will need to refer to endocrinology.  8. Chronic hypoxemic respiratory failure: Home oxygen > 1 year, ?component of OHS.   Work with PT/rehab today, needs 1-2 more days of diuresis.    Length of Stay: 3   Loralie Champagne 09/17/2015, 8:45 AM  Advanced Heart Failure Team Pager [phone] (M-F; 7a - 4p)  Please contact [facility] for night-coverage after hours (4p -7a ) and weekends on [email or web address]

## 2015-09-17 NOTE — Progress Notes (Signed)
CARDIAC REHAB PHASE I   PRE:  Rate/Rhythm: 60 SR  BP:  Supine:   Sitting: 143/52  Standing:    SaO2: 93% 4L  MODE:  Ambulation: 200 ft and then another 150 ft  POST:  Rate/Rhythm: 79 SR  BP:  Supine:   Sitting: 149/43  Standing:    SaO2: 89%-91% 6L first walk and 92% 6L second walk 1315-1402 Assisted to [facility] and then walked 200 ft on 6L with gait belt use, and rolling walker. Pt stated she would like to go farther as she was not tired, so checked sats and we walked another 150 ft. Very motivated. Back to 4L when resting in room. Asked pt teach back questions re daily weights, sodium restriction and FR. Pt able to answer. Stated she has CHF booklet and does not need low sodium handouts. Left hand trembling today when she stated it does sometimes. To lying in bed after walk.   Graylon Good, RN BSN  09/17/2015 1:59 PM

## 2015-09-18 LAB — CBC
HEMATOCRIT: 33.2 % — AB (ref 36.0–46.0)
HEMOGLOBIN: 9.3 g/dL — AB (ref 12.0–15.0)
MCH: 25.5 pg — AB (ref 26.0–34.0)
MCHC: 28 g/dL — AB (ref 30.0–36.0)
MCV: 91 fL (ref 78.0–100.0)
Platelets: 226 10*3/uL (ref 150–400)
RBC: 3.65 MIL/uL — ABNORMAL LOW (ref 3.87–5.11)
RDW: 17 % — ABNORMAL HIGH (ref 11.5–15.5)
WBC: 7.6 10*3/uL (ref 4.0–10.5)

## 2015-09-18 LAB — BASIC METABOLIC PANEL
ANION GAP: 7 (ref 5–15)
BUN: 54 mg/dL — ABNORMAL HIGH (ref 6–20)
CO2: 34 mmol/L — AB (ref 22–32)
Calcium: 9 mg/dL (ref 8.9–10.3)
Chloride: 95 mmol/L — ABNORMAL LOW (ref 101–111)
Creatinine, Ser: 1.7 mg/dL — ABNORMAL HIGH (ref 0.44–1.00)
GFR calc Af Amer: 35 mL/min — ABNORMAL LOW (ref >60)
GFR, EST NON AFRICAN AMERICAN: 30 mL/min — AB (ref >60)
GLUCOSE: 271 mg/dL — AB (ref 65–99)
POTASSIUM: 4 mmol/L (ref 3.5–5.1)
Sodium: 136 mmol/L (ref 135–145)

## 2015-09-18 LAB — GLUCOSE, CAPILLARY
GLUCOSE-CAPILLARY: 165 mg/dL — AB (ref 65–99)
GLUCOSE-CAPILLARY: 214 mg/dL — AB (ref 65–99)
Glucose-Capillary: 189 mg/dL — ABNORMAL HIGH (ref 65–99)

## 2015-09-18 MED ORDER — POTASSIUM CHLORIDE CRYS ER 20 MEQ PO TBCR
40.0000 meq | EXTENDED_RELEASE_TABLET | Freq: Once | ORAL | Status: AC
  Administered 2015-09-18: 40 meq via ORAL
  Filled 2015-09-18: qty 2

## 2015-09-18 MED ORDER — METOLAZONE 2.5 MG PO TABS
2.5000 mg | ORAL_TABLET | Freq: Once | ORAL | Status: AC
  Administered 2015-09-18: 2.5 mg via ORAL
  Filled 2015-09-18: qty 1

## 2015-09-18 NOTE — Progress Notes (Signed)
Just talked with cardiologist regarding pt's output (since there is strict I/O since 3 days and not much output recorded, most of the time recorded under occurrence), since pt is on lasix 65m IV TID, Foley catheter insertion will be initiated for the strict I/O and RX purpose.

## 2015-09-18 NOTE — Progress Notes (Signed)
Patient ID: [name] [name], female   DOB: 12-27-1945, 70 y.o.   MRN: [id number]    Advanced Heart Failure Rounding Note   Subjective:    Admitted with marked volume overload. Diuresing with IV lasix. I/O not recorded but weight down again.  She is incontinent.  Still requiring 5L O2 by [place].  Denies dyspnea at rest.   RHC Procedural Findings (9/7): Hemodynamics (mmHg) RA mean 15 RV 76/18 PA 73/26, mean 42 PCWP mean 25 Oxygen saturations: PA 56% AO 93% Cardiac Output (Fick) 5.68  Cardiac Index (Fick) 2.84 PVR 3.0 WU  Objective:   Weight Range:  Vital Signs:   Temp:  [98.6 F (37 C)-98.9 F (37.2 C)] 98.6 F (37 C) (09/09 0400) Pulse Rate:  [58] 58 (09/09 0400) Resp:  [18] 18 (09/09 0400) BP: (136-152)/(34-42) 152/40 (09/09 0400) SpO2:  [91 %-98 %] 96 % (09/09 0400) Weight:  [196 lb (88.9 kg)] 196 lb (88.9 kg) (09/09 0400) Last BM Date: 09/15/15  Weight change: Filed Weights   09/16/15 0353 09/17/15 0242 09/18/15 0400  Weight: 200 lb 1.6 oz (90.8 kg) 197 lb 1.6 oz (89.4 kg) 196 lb (88.9 kg)    Intake/Output:   Intake/Output Summary (Last 24 hours) at 09/18/15 0856 Last data filed at 09/18/15 0846  Gross per 24 hour  Intake              723 ml  Output             1000 ml  Net             -277 ml     Physical Exam: General:  Well appearing. No resp difficulty HEENT: normal Neck: supple. JVP 12. Carotids 2+ bilat; no bruits. No lymphadenopathy or thryomegaly appreciated. Cor: PMI nondisplaced. Regular rate & rhythm. No rubs, gallops or murmurs. Lungs: clear Abdomen: soft, nontender, nondistended. No hepatosplenomegaly. No bruits or masses. Good bowel sounds. Extremities: no cyanosis, clubbing, rash.  1+ ankle edema.  Neuro: alert & orientedx3, cranial nerves grossly intact. moves all 4 extremities w/o difficulty. Affect pleasant  Telemetry: NSR  Labs: Basic Metabolic Panel:  Recent Labs Lab 09/14/15 1654 09/15/15 0233 09/16/15 0212 09/17/15 0349  09/18/15 0146  NA 135 137 140 141 136  K 4.4 4.5 3.7 4.7 4.0  CL 98* 96* 100* 99* 95*  CO2 _0 34* 34*  GLUCOSE 147* 131* 148* 147* 271*  BUN 81* 76* 66* 55* 54*  CREATININE 1.99* 1.89* 1.65* 1.57* 1.70*  CALCIUM 9.0 8.5* 8.8* 9.3 9.0  MG 3.1*  --   --   --   --     Liver Function Tests:  Recent Labs Lab 09/14/15 1654  AST 20  ALT 18  ALKPHOS 82  BILITOT 0.9  PROT 7.8  ALBUMIN 3.8   No results for input(s): LIPASE, AMYLASE in the last 168 hours. No results for input(s): AMMONIA in the last 168 hours.  CBC:  Recent Labs Lab 09/14/15 1654 09/16/15 0212 09/17/15 0349 09/18/15 0146  WBC 9.3 7.7 7.8 7.6  NEUTROABS 7.6  --   --   --   HGB 10.5* 9.3* 9.8* 9.3*  HCT 35.3* 31.8* 34.4* 33.2*  MCV 90.3 90.3 92.0 91.0  PLT 227 213 216 226    Cardiac Enzymes: No results for input(s): CKTOTAL, CKMB, CKMBINDEX, TROPONINI in the last 168 hours.  BNP: BNP (last 3 results)  Recent Labs  10/10/14 2153 07/23/15 1629 09/14/15 1654  BNP 914.0* 654.0* 637.9*  ProBNP (last 3 results) No results for input(s): PROBNP in the last 8760 hours.    Other results:  Imaging: Dg Chest Port 1 View  Result Date: 09/16/2015 CLINICAL DATA:  Hypoxia, history of CHF, coronary artery disease with CABG, hypertension, diabetes, former very heavy smoker discontinued in 2011. EXAM: PORTABLE CHEST 1 VIEW COMPARISON:  PA and lateral chest x-ray of August 15, 2015 FINDINGS: The lungs are adequately inflated. The interstitial markings are increased bilaterally. There is a small amount of pleural fluid versus pleural thickening on the right. The cardiac silhouette is enlarged. The pulmonary vascularity is mildly engorged. There is calcification in the wall of the aortic arch. The patient has undergone previous CABG. IMPRESSION: CHF with mild pulmonary interstitial edema. Underlying COPD. Small right pleural effusion versus pleural thickening. Electronically Signed   By: David  Martinique M.D.    On: 09/16/2015 14:53     Medications:     Scheduled Medications: . amLODipine  10 mg Oral Daily  . umeclidinium bromide  1 puff Inhalation Daily   And  . arformoterol  15 mcg Nebulization BID  . aspirin EC  81 mg Oral Daily  . atorvastatin  40 mg Oral q1800  . citalopram  40 mg Oral Daily  . furosemide  80 mg Intravenous Q8H  . gabapentin  300 mg Oral QHS  . heparin  5,000 Units Subcutaneous Q8H  . hydrALAZINE  50 mg Oral TID  . insulin aspart  0-15 Units Subcutaneous TID WC  . insulin aspart  3 Units Subcutaneous TID WC  . isosorbide mononitrate  30 mg Oral Daily  . methimazole  10 mg Oral Daily  . metolazone  2.5 mg Oral Once  . multivitamin with minerals  1 tablet Oral Daily  . potassium chloride  40 mEq Oral Once  . sodium chloride flush  3 mL Intravenous Q12H  . sodium chloride flush  3 mL Intravenous Q12H  . sodium chloride flush  3 mL Intravenous Q12H  . spironolactone  12.5 mg Oral Daily    Infusions:    PRN Medications: sodium chloride, sodium chloride, sodium chloride, acetaminophen, albuterol, docusate sodium, HYDROcodone-acetaminophen, morphine injection, nitroGLYCERIN, ondansetron (ZOFRAN) IV, sodium chloride flush, sodium chloride flush, sodium chloride flush   Assessment/Plan   1. Acute on chronic diastolic CHF: I suspect that there is a significant component of RV failure as well. Echo 7/17 with moderate LVH, EF 55-60%, D-shaped septum, PASP 52 mmHg.  15 lb weight gain over the last week or so prior to admission. 9/7 RHC showed ongoing volume overload with primarily pulmonary venous hypertension.  Diuresis is complicated by CKD stage III, suspect cardiorenal syndrome.  On exam today, still volume overloaded and on higher oxygen by [place] than at home.  I/Os not recorded b/c of incontinence but weight down.  Creatinine higher but BUN lower. - Continue Lasix 80 mg IV every 8 hrs. Will give metolazone 2.5 mg x 1 again today. - Needs foley catheter because of  incontinence (having to change bed linens multiple times).   2. CKD stage III: Follow creatinine closely with diuresis. Relatively stable today.  Hold losartan with creatinine mildly up.  3. CAD: s/p CABG. No chest pain. H/o PCI to SVG-D in 9/15.  - Continue ASA 81, statin. - Holding Plavix so Pleurx can be removed => needs 5 days off Plavix. 4. Recurrent right pleural effusion: Right Pleurx catheter no longer draining. CXR PA/lat: Pleural effusion resolved.  Stop plavix for 5 days then remove outpatient  most likely.  5. Suspect OSA: Will need sleep study as outpatient.  6. PAD: ABI 0.5 in right foot. Needs followup with Dr. Gwenlyn Found after discharge.  7. Hyperthyroidism: Started on methimazole. Will need to refer to endocrinology.  8. Chronic hypoxemic respiratory failure: Home oxygen > 1 year, ?component of OHS.   Work with PT/rehab today, needs more diuresis.    Length of Stay: [place] 09/18/2015, 8:56 AM  Advanced Heart Failure Team Pager [phone] (M-F; 7a - 4p)  Please contact [facility] for night-coverage after hours (4p -7a ) and weekends on [email or web address]

## 2015-09-19 LAB — BASIC METABOLIC PANEL
Anion gap: 9 (ref 5–15)
BUN: 57 mg/dL — AB (ref 6–20)
CALCIUM: 9.3 mg/dL (ref 8.9–10.3)
CHLORIDE: 94 mmol/L — AB (ref 101–111)
CO2: 33 mmol/L — AB (ref 22–32)
CREATININE: 1.74 mg/dL — AB (ref 0.44–1.00)
GFR calc non Af Amer: 29 mL/min — ABNORMAL LOW (ref >60)
GFR, EST AFRICAN AMERICAN: 34 mL/min — AB (ref >60)
Glucose, Bld: 162 mg/dL — ABNORMAL HIGH (ref 65–99)
Potassium: 4.2 mmol/L (ref 3.5–5.1)
Sodium: 136 mmol/L (ref 135–145)

## 2015-09-19 LAB — GLUCOSE, CAPILLARY
GLUCOSE-CAPILLARY: 256 mg/dL — AB (ref 65–99)
GLUCOSE-CAPILLARY: 125 mg/dL — AB (ref 65–99)
Glucose-Capillary: 150 mg/dL — ABNORMAL HIGH (ref 65–99)
Glucose-Capillary: 208 mg/dL — ABNORMAL HIGH (ref 65–99)

## 2015-09-19 MED ORDER — TORSEMIDE 20 MG PO TABS
40.0000 mg | ORAL_TABLET | Freq: Two times a day (BID) | ORAL | Status: DC
  Administered 2015-09-19 – 2015-09-21 (×4): 40 mg via ORAL
  Filled 2015-09-19 (×4): qty 2

## 2015-09-19 MED ORDER — ACETAMINOPHEN 325 MG PO TABS: 650.0000 mg | ORAL_TABLET | Freq: Four times a day (QID) | ORAL | Status: DC | PRN

## 2015-09-19 MED ORDER — MORPHINE SULFATE (PF) 2 MG/ML IV SOLN
2.0000 mg | INTRAVENOUS | Status: DC | PRN
  Administered 2015-09-19 – 2015-09-21 (×10): 2 mg via INTRAVENOUS
  Filled 2015-09-19 (×10): qty 1

## 2015-09-19 NOTE — Progress Notes (Signed)
Patient ID: [name] [name], female   DOB: 09-02-1945, 70 y.o.   MRN: [id number]    Advanced Heart Failure Rounding Note   Subjective:    Admitted with marked volume overload. Diuresing with IV lasix. Weight now down a total of 19 lbs.  Breathing better.  Complains of right hip pain.   RHC Procedural Findings (9/7): Hemodynamics (mmHg) RA mean 15 RV 76/18 PA 73/26, mean 42 PCWP mean 25 Oxygen saturations: PA 56% AO 93% Cardiac Output (Fick) 5.68  Cardiac Index (Fick) 2.84 PVR 3.0 WU  Objective:   Weight Range:  Vital Signs:   Temp:  [98.5 F (36.9 C)-98.8 F (37.1 C)] 98.5 F (36.9 C) (09/10 0447) Pulse Rate:  [55-60] 55 (09/10 0447) Resp:  [18] 18 (09/10 0447) BP: (134-158)/(37-46) 134/43 (09/10 0447) SpO2:  [95 %-97 %] 96 % (09/10 0447) Weight:  [192 lb 1.6 oz (87.1 kg)] 192 lb 1.6 oz (87.1 kg) (09/10 0447) Last BM Date: 09/15/15  Weight change: Filed Weights   09/17/15 0242 09/18/15 0400 09/19/15 0447  Weight: 197 lb 1.6 oz (89.4 kg) 196 lb (88.9 kg) 192 lb 1.6 oz (87.1 kg)    Intake/Output:   Intake/Output Summary (Last 24 hours) at 09/19/15 0906 Last data filed at 09/19/15 0704  Gross per 24 hour  Intake              680 ml  Output             3900 ml  Net            -3220 ml     Physical Exam: General:  Well appearing. No resp difficulty HEENT: normal Neck: supple. JVP 8. Carotids 2+ bilat; no bruits. No lymphadenopathy or thryomegaly appreciated. Cor: PMI nondisplaced. Regular rate & rhythm. No rubs, gallops or murmurs. Lungs: clear Abdomen: soft, nontender, nondistended. No hepatosplenomegaly. No bruits or masses. Good bowel sounds. Extremities: no cyanosis, clubbing, rash.  No edema.  Neuro: alert & orientedx3, cranial nerves grossly intact. moves all 4 extremities w/o difficulty. Affect pleasant  Telemetry: NSR  Labs: Basic Metabolic Panel:  Recent Labs Lab 09/14/15 1654 09/15/15 0233 09/16/15 0212 09/17/15 0349 09/18/15 0146  09/19/15 0252  NA 135 137 140 141 136 136  K 4.4 4.5 3.7 4.7 4.0 4.2  CL 98* 96* 100* 99* 95* 94*  CO2 _0 34* 34* 33*  GLUCOSE 147* 131* 148* 147* 271* 162*  BUN 81* 76* 66* 55* 54* 57*  CREATININE 1.99* 1.89* 1.65* 1.57* 1.70* 1.74*  CALCIUM 9.0 8.5* 8.8* 9.3 9.0 9.3  MG 3.1*  --   --   --   --   --     Liver Function Tests:  Recent Labs Lab 09/14/15 1654  AST 20  ALT 18  ALKPHOS 82  BILITOT 0.9  PROT 7.8  ALBUMIN 3.8   No results for input(s): LIPASE, AMYLASE in the last 168 hours. No results for input(s): AMMONIA in the last 168 hours.  CBC:  Recent Labs Lab 09/14/15 1654 09/16/15 0212 09/17/15 0349 09/18/15 0146  WBC 9.3 7.7 7.8 7.6  NEUTROABS 7.6  --   --   --   HGB 10.5* 9.3* 9.8* 9.3*  HCT 35.3* 31.8* 34.4* 33.2*  MCV 90.3 90.3 92.0 91.0  PLT 227 213 216 226    Cardiac Enzymes: No results for input(s): CKTOTAL, CKMB, CKMBINDEX, TROPONINI in the last 168 hours.  BNP: BNP (last 3 results)  Recent Labs  10/10/14 2153 07/23/15 1629  09/14/15 1654  BNP 914.0* 654.0* 637.9*    ProBNP (last 3 results) No results for input(s): PROBNP in the last 8760 hours.    Other results:  Imaging: No results found.   Medications:     Scheduled Medications: . amLODipine  10 mg Oral Daily  . umeclidinium bromide  1 puff Inhalation Daily   And  . arformoterol  15 mcg Nebulization BID  . aspirin EC  81 mg Oral Daily  . atorvastatin  40 mg Oral q1800  . citalopram  40 mg Oral Daily  . gabapentin  300 mg Oral QHS  . heparin  5,000 Units Subcutaneous Q8H  . hydrALAZINE  50 mg Oral TID  . insulin aspart  0-15 Units Subcutaneous TID WC  . insulin aspart  3 Units Subcutaneous TID WC  . isosorbide mononitrate  30 mg Oral Daily  . methimazole  10 mg Oral Daily  . multivitamin with minerals  1 tablet Oral Daily  . sodium chloride flush  3 mL Intravenous Q12H  . sodium chloride flush  3 mL Intravenous Q12H  . sodium chloride flush  3 mL Intravenous  Q12H  . spironolactone  12.5 mg Oral Daily  . torsemide  40 mg Oral BID    Infusions:    PRN Medications: sodium chloride, sodium chloride, sodium chloride, acetaminophen, acetaminophen, albuterol, docusate sodium, HYDROcodone-acetaminophen, morphine injection, nitroGLYCERIN, ondansetron (ZOFRAN) IV, sodium chloride flush, sodium chloride flush, sodium chloride flush   Assessment/Plan   1. Acute on chronic diastolic CHF: I suspect that there is a significant component of RV failure as well. Echo 7/17 with moderate LVH, EF 55-60%, D-shaped septum, PASP 52 mmHg.  15 lb weight gain over the last week or so prior to admission. 9/7 RHC showed ongoing volume overload with primarily pulmonary venous hypertension.  Diuresis is complicated by CKD stage III, suspect cardiorenal syndrome.  On exam, volume status looks better.  - transition to torsemide 40 mg po bid.  2. CKD stage III: Follow creatinine closely with diuresis. Relatively stable today.  Holding losartan with creatinine mildly up.  3. CAD: s/p CABG. No chest pain. H/o PCI to SVG-D in 9/15.  - Continue ASA 81, statin. - Holding Plavix so Pleurx can be removed => needs 5 days off Plavix, tomorrow will be 5 days.  Will see if we can get this out before she goes home. 4. Recurrent right pleural effusion: Right Pleurx catheter no longer draining. CXR PA/lat: Pleural effusion resolved.  Stop plavix for 5 days, hopefully can remove tomorrow.  5. Suspect OSA: Will need sleep study as outpatient.  6. PAD: ABI 0.5 in right foot. Needs followup with Dr. Gwenlyn Found after discharge.  7. Hyperthyroidism: Started on methimazole. Will need to refer to endocrinology.  8. Chronic hypoxemic respiratory failure: Home oxygen > 1 year, ?component of OHS. Can turn oxygen back down to her home 2L.  9. Hip pain: Has this chronically but flaring.  Getting vicodin, Tylenol.  Will let her have morphine for breakthrough.   Length of Stay: [place] 09/19/2015, 9:06 AM  Advanced Heart Failure Team Pager [phone] (M-F; 7a - 4p)  Please contact [facility] for night-coverage after hours (4p -7a ) and weekends on [email or web address]

## 2015-09-20 ENCOUNTER — Encounter (HOSPITAL_COMMUNITY): Payer: Self-pay | Admitting: Physician Assistant

## 2015-09-20 LAB — GLUCOSE, CAPILLARY
GLUCOSE-CAPILLARY: 168 mg/dL — AB (ref 65–99)
GLUCOSE-CAPILLARY: 149 mg/dL — AB (ref 65–99)
GLUCOSE-CAPILLARY: 127 mg/dL — AB (ref 65–99)
GLUCOSE-CAPILLARY: 187 mg/dL — AB (ref 65–99)

## 2015-09-20 LAB — BASIC METABOLIC PANEL
ANION GAP: 10 (ref 5–15)
BUN: 62 mg/dL — AB (ref 6–20)
CHLORIDE: 93 mmol/L — AB (ref 101–111)
CO2: 34 mmol/L — AB (ref 22–32)
Calcium: 9.4 mg/dL (ref 8.9–10.3)
Creatinine, Ser: 1.73 mg/dL — ABNORMAL HIGH (ref 0.44–1.00)
GFR calc Af Amer: 34 mL/min — ABNORMAL LOW (ref >60)
GFR, EST NON AFRICAN AMERICAN: 29 mL/min — AB (ref >60)
GLUCOSE: 163 mg/dL — AB (ref 65–99)
POTASSIUM: 4.5 mmol/L (ref 3.5–5.1)
Sodium: 137 mmol/L (ref 135–145)

## 2015-09-20 LAB — MAGNESIUM: Magnesium: 3 mg/dL — ABNORMAL HIGH (ref 1.7–2.4)

## 2015-09-20 LAB — BRAIN NATRIURETIC PEPTIDE: B Natriuretic Peptide: 200.6 pg/mL — ABNORMAL HIGH (ref 0.0–100.0)

## 2015-09-20 MED ORDER — HEPARIN SODIUM (PORCINE) 5000 UNIT/ML IJ SOLN: 5000.0000 [IU] | Freq: Three times a day (TID) | INTRAMUSCULAR | Status: DC

## 2015-09-20 NOTE — Progress Notes (Signed)
Physical Therapy Treatment Patient Details Name: [name] [name] MRN: [id number] DOB: 1945-01-11 Today's Date: 09/20/2015    History of Present Illness 70 yo with history of chronic diastolic CHF, CAD s/p CABG, chronic right pleural effusion, CKD stage III, and PAD admitted with acute CHF exacerbation.     PT Comments    Pt continues to demo significant drop in O2 sats with ambulation. Continue to monitor.  Follow Up Recommendations  Home health PT     Equipment Recommendations  None recommended by PT    Recommendations for Other Services       Precautions / Restrictions Precautions Precautions: Fall Precaution Comments: watch O2 sats    Mobility  Bed Mobility   Bed Mobility: Sit to Supine     Supine to sit: Supervision Sit to supine: Supervision   General bed mobility comments: +rail, increased time to complete at supervision level  Transfers   Equipment used: Rolling walker (2 wheeled)   Sit to Stand: Min guard         General transfer comment: verbal cues for hand placement  Ambulation/Gait Ambulation/Gait assistance: Supervision;Min guard Ambulation Distance (Feet): 450 Feet Assistive device: Rolling walker (2 wheeled) Gait Pattern/deviations: Step-through pattern;Decreased stride length;Trunk flexed;Wide base of support   Gait velocity interpretation: Below normal speed for age/gender General Gait Details: slow, steady gait; O2 sats 95% on 2 L O2 prior to ambulation. Pt placed on 3 L for gait with O2 decreasing to 80% after 200 feet. O2 increased to 4 L for remainder of distance. Sats at 78% upon return to room. Quick return to 93% upon return to supine with O2 at 2 L.   Stairs            Wheelchair Mobility    Modified Rankin (Stroke Patients Only)       Balance                                    Cognition Arousal/Alertness: Awake/alert Behavior During Therapy: WFL for tasks assessed/performed Overall Cognitive Status:  Within Functional Limits for tasks assessed                      Exercises      General Comments        Pertinent Vitals/Pain Pain Assessment: 0-10 Pain Score: 8  Pain Location: R hip after ambulation Pain Descriptors / Indicators: Shooting Pain Intervention(s): Monitored during session;Patient requesting pain meds-RN notified    Home Living                      Prior Function            PT Goals (current goals can now be found in the care plan section) Acute Rehab PT Goals Patient Stated Goal: To go home PT Goal Formulation: With patient Time For Goal Achievement: 09/23/15 Potential to Achieve Goals: Good Progress towards PT goals: Progressing toward goals    Frequency  Min 3X/week    PT Plan Current plan remains appropriate    Co-evaluation             End of Session Equipment Utilized During Treatment: Gait belt;Oxygen Activity Tolerance: Patient tolerated treatment well Patient left: in bed;with call bell/phone within reach     Time: 2536-6440 PT Time Calculation (min) (ACUTE ONLY): 32 min  Charges:  $Gait Training: 23-37 mins  G Codes:      Lorriane Shire 09/20/2015, 9:43 AM

## 2015-09-20 NOTE — Progress Notes (Signed)
Patient ID: [name] [name], female   DOB: January 22, 1945, 70 y.o.   MRN: [id number]    Advanced Heart Failure Rounding Note   Subjective:    Admitted with marked volume overload. Diuresing with IV lasix. Weight now down a total of 22 lbs.   Denies SOB. Wants to go home.   RHC Procedural Findings (9/7): Hemodynamics (mmHg) RA mean 15 RV 76/18 PA 73/26, mean 42 PCWP mean 25 Oxygen saturations: PA 56% AO 93% Cardiac Output (Fick) 5.68  Cardiac Index (Fick) 2.84 PVR 3.0 WU  Objective:   Weight Range:  Vital Signs:   Temp:  [98.4 F (36.9 C)-98.8 F (37.1 C)] 98.5 F (36.9 C) (09/11 0530) Pulse Rate:  [52-62] 62 (09/11 0530) Resp:  [18] 18 (09/11 0530) BP: (127-138)/(35-41) 138/35 (09/11 0530) SpO2:  [96 %-97 %] 96 % (09/11 0530) Weight:  [189 lb 11.2 oz (86 kg)] 189 lb 11.2 oz (86 kg) (09/11 0530) Last BM Date: 09/15/15  Weight change: Filed Weights   09/18/15 0400 09/19/15 0447 09/20/15 0530  Weight: 196 lb (88.9 kg) 192 lb 1.6 oz (87.1 kg) 189 lb 11.2 oz (86 kg)    Intake/Output:   Intake/Output Summary (Last 24 hours) at 09/20/15 0801 Last data filed at 09/20/15 0530  Gross per 24 hour  Intake              960 ml  Output             2650 ml  Net            -1690 ml     Physical Exam: General:  Well appearing. No resp difficulty. In bed.  HEENT: normal Neck: supple. JVP 8 Carotids 2+ bilat; no bruits. No lymphadenopathy or thryomegaly appreciated. Cor: PMI nondisplaced. Regular rate & rhythm. No rubs, gallops or murmurs. Lungs: clear on 2 liters.  Abdomen: soft, nontender, nondistended. No hepatosplenomegaly. No bruits or masses. Good bowel sounds. Extremities: no cyanosis, clubbing, rash.  No edema.  Neuro: alert & orientedx3, cranial nerves grossly intact. moves all 4 extremities w/o difficulty. Affect pleasant  Telemetry: NSR  Labs: Basic Metabolic Panel:  Recent Labs Lab 09/14/15 1654  09/16/15 0212 09/17/15 0349 09/18/15 0146 09/19/15 0252  09/20/15 0205  NA 135  < > 140 141 136 136 137  K 4.4  < > 3.7 4.7 4.0 4.2 4.5  CL 98*  < > 100* 99* 95* 94* 93*  CO2 28  < > 31 34* 34* 33* 34*  GLUCOSE 147*  < > 148* 147* 271* 162* 163*  BUN 81*  < > 66* 55* 54* 57* 62*  CREATININE 1.99*  < > 1.65* 1.57* 1.70* 1.74* 1.73*  CALCIUM 9.0  < > 8.8* 9.3 9.0 9.3 9.4  MG 3.1*  --   --   --   --   --  3.0*  < > = values in this interval not displayed.  Liver Function Tests:  Recent Labs Lab 09/14/15 1654  AST 20  ALT 18  ALKPHOS 82  BILITOT 0.9  PROT 7.8  ALBUMIN 3.8   No results for input(s): LIPASE, AMYLASE in the last 168 hours. No results for input(s): AMMONIA in the last 168 hours.  CBC:  Recent Labs Lab 09/14/15 1654 09/16/15 0212 09/17/15 0349 09/18/15 0146  WBC 9.3 7.7 7.8 7.6  NEUTROABS 7.6  --   --   --   HGB 10.5* 9.3* 9.8* 9.3*  HCT 35.3* 31.8* 34.4* 33.2*  MCV 90.3 90.3  92.0 91.0  PLT 227 213 216 226    Cardiac Enzymes: No results for input(s): CKTOTAL, CKMB, CKMBINDEX, TROPONINI in the last 168 hours.  BNP: BNP (last 3 results)  Recent Labs  07/23/15 1629 09/14/15 1654 09/20/15 0205  BNP 654.0* 637.9* 200.6*    ProBNP (last 3 results) No results for input(s): PROBNP in the last 8760 hours.    Other results:  Imaging: No results found.   Medications:     Scheduled Medications: . amLODipine  10 mg Oral Daily  . umeclidinium bromide  1 puff Inhalation Daily   And  . arformoterol  15 mcg Nebulization BID  . aspirin EC  81 mg Oral Daily  . atorvastatin  40 mg Oral q1800  . citalopram  40 mg Oral Daily  . gabapentin  300 mg Oral QHS  . heparin  5,000 Units Subcutaneous Q8H  . hydrALAZINE  50 mg Oral TID  . insulin aspart  0-15 Units Subcutaneous TID WC  . insulin aspart  3 Units Subcutaneous TID WC  . isosorbide mononitrate  30 mg Oral Daily  . methimazole  10 mg Oral Daily  . multivitamin with minerals  1 tablet Oral Daily  . sodium chloride flush  3 mL Intravenous Q12H  .  sodium chloride flush  3 mL Intravenous Q12H  . sodium chloride flush  3 mL Intravenous Q12H  . spironolactone  12.5 mg Oral Daily  . torsemide  40 mg Oral BID    Infusions:    PRN Medications: sodium chloride, sodium chloride, sodium chloride, acetaminophen, acetaminophen, albuterol, docusate sodium, HYDROcodone-acetaminophen, morphine injection, morphine injection, nitroGLYCERIN, ondansetron (ZOFRAN) IV, sodium chloride flush, sodium chloride flush, sodium chloride flush   Assessment/Plan   1. Acute on chronic diastolic CHF: I suspect that there is a significant component of RV failure as well. Echo 7/17 with moderate LVH, EF 55-60%, D-shaped septum, PASP 52 mmHg.  15 lb weight gain over the last week or so prior to admission. 9/7 RHC showed ongoing volume overload with primarily pulmonary venous hypertension.  Diuresis is complicated by CKD stage III, suspect cardiorenal syndrome.   Appears euvolemic.  Overall diuresed 22 pounds.  - Continue  torsemide 40 mg po bid.  2. CKD stage III: Follow creatinine closely with diuresis. Creatinine leveled off 1.7. Holding losartan with creatinine mildly up.  3. CAD: s/p CABG. No chest pain. H/o PCI to SVG-D in 9/15.  - Continue ASA 81, statin. - Holding Plavix so Pleurx can be removed => needs 5 days off Plavix. Ask IR to remove today.  4. Recurrent right pleural effusion: Right Pleurx catheter no longer draining. CXR PA/lat: Pleural effusion resolved.  Stop plavix for 5 days, hopefully can remove today.  5. Suspect OSA: Will need sleep study as outpatient.  6. PAD: ABI 0.5 in right foot. Needs followup with Dr. Gwenlyn Found after discharge.  7. Hyperthyroidism: Started on methimazole. Will need to refer to endocrinology.  8. Chronic hypoxemic respiratory failure: Home oxygen > 1 year, ?component of OHS. Can turn oxygen back down to her home 2L.  9. Hip pain: Has this chronically but flaring.  Getting vicodin, Tylenol.  Will let her have morphine for  breakthrough.   Will need [facility] for HH. Will try telemonitoring + diuretic protocol.   Length of Stay: [place] NP-C  09/20/2015, 8:01 AM  Advanced Heart Failure Team Pager [phone] (M-F; 7a - 4p)  Please contact [facility] for night-coverage after hours (4p -7a )  and weekends on [email or web address]  Patient seen with NP, agree with the above note.  Good diuresis breathing better.  Will ask IR to remove Pleurx today, can restart Plavix tomorrow. Will send home on torsemide 40 mg po bid, on spironolactone so no supplemental K. Needs close followup in CHF clinic, 7-10 days.  Stay off losartan with elevated creatinine.  Will need home health as above.   Loralie Champagne 09/20/2015 8:33 AM

## 2015-09-20 NOTE — Progress Notes (Signed)
1050 Offered to walk with pt. Just walked with PT. Will follow up as time permits. Graylon Good RN BSN 09/20/2015 10:49 AM

## 2015-09-20 NOTE — Progress Notes (Signed)
Spoke w pt. She is active w [facility] for [facility]hr[facility], have req hhpt also. Pt states has bsc, rw, tub seat and oxygen w [facility]. Will alert [facility] at disch.

## 2015-09-20 NOTE — Care Management Important Message (Signed)
Important Message  Patient Details  Name: [name] [name] MRN: [id number] Date of Birth: December 01, 1945   Medicare Important Message Given:  Yes    Orbie Pyo 09/20/2015, 2:42 PM

## 2015-09-20 NOTE — Progress Notes (Signed)
CARDIAC REHAB PHASE I   PRE:  Rate/Rhythm: 64 SR  BP:  Supine: 149/44  Sitting:   Standing:    SaO2: 94%2L  MODE:  Ambulation: 600 ft   POST:  Rate/Rhythm: 78 SR  BP:  Supine:   Sitting: 147/38  Standing:    SaO2: 88% 4L hall and 92% 4L room 1420-1459 Pt very motivated to walk. Walked 600 ft on 4L with gait belt use,rolling walker and asst x 1 with steady gait. Tolerated well except legs started to hurt. Requested pain med. Back to bed with alarm on.   Graylon Good, RN BSN  09/20/2015 2:54 PM

## 2015-09-20 NOTE — H&P (Signed)
Chief Complaint: Tunneled pleural catheter no longer draining  Referring Physician(s): Rowland Lathe  Supervising Physician: Sandi Mariscal  Patient Status: Inpatient  History of Present Illness: [name] [name] is a 70 y.o. female with known CHF who was admitted with marked volume overload on 09/14/2015.  She has been diuresing with Lasix and is now down 22 lbs.  She has a tunneled pleural catheter in place which is no longer draining.  This was initially placed by Dr. Pascal Lux on 10/15/2015.  She denies any signs of infection.  She has been taking Plavix and this has been held since 9/7 (Last dose was 9/6)  She is on Sub-Cu heparin which will need to be held.  Past Medical History:  Diagnosis Date  . Arthritis   . Carotid artery disease (HCC)    a. Bilateral CEA; 50-60% bilateral ICA stenosis, 11/12  . Cellulitis    a. Recurrent, bilateral legs  . CHF (congestive heart failure) ([place])   . Chronic back pain   . Chronic diastolic heart failure ([place])   . Coronary atherosclerosis of native coronary artery    a. 11/2010 NSTEMI/CABG x 4: L-LAD; S-PL; S-OM; S-DX, by PVT.  [name] Critical lower limb ischemia   . Depression   . Essential hypertension, benign   . Herniated disc   . History of blood transfusion   . Ischemic cardiomyopathy    a. 11/2010 TEE: EF 40-45%.  . Migraine   . Mixed hyperlipidemia   . NSTEMI (non-ST elevated myocardial infarction) ([place]) 11/2010  . On home oxygen therapy   . PAD (peripheral artery disease) ([place])   . Pneumonia 2000's X 2  . Stroke ([place]) 08/2009  . Suicide attempt ([place]) 08/2002  . TIA (transient ischemic attack)   . TMJ syndrome   . Type 2 diabetes mellitus ([place])   . Venous insufficiency     Past Surgical History:  Procedure Laterality Date  . ABDOMINAL HYSTERECTOMY    . APPENDECTOMY    . CARDIAC CATHETERIZATION  11/2010  . CARDIAC CATHETERIZATION N/A 09/16/2015   Procedure: Right Heart Cath;  Surgeon: Larey Dresser, MD;  Location: [facility];  Service: Cardiovascular;  Laterality: N/A;  . CAROTID ENDARTERECTOMY Bilateral 2011   Bilateral - Dr. Kellie Simmering  . CHOLECYSTECTOMY    . COLONOSCOPY W/ BIOPSIES AND POLYPECTOMY    . CORONARY ANGIOPLASTY WITH STENT PLACEMENT  03/2013   "1"  . CORONARY ARTERY BYPASS GRAFT  11/24/2010   Procedure: CORONARY ARTERY BYPASS GRAFTING (CABG);  Surgeon: Tharon Aquas Adelene Idler, MD;  Location: [facility];  Service: Open Heart Surgery;  Laterality: N/A;  Coronary Artery Bypass Graft times four on pump utilizing left internal mammary artery and bilateral greater saphenous veins harvested endoscopically, transesophageal echocardiogram   . DEBRIDEMENT TOE Left    "nonhealing wound; 3rd digit  . DILATION AND CURETTAGE OF UTERUS    . ENDARTERECTOMY POPLITEAL Left 10/07/2013   Procedure: LEFT POPLITEAL ENDARTERECTOMY ;  Surgeon: Angelia Mould, MD;  Location: [facility];  Service: Vascular;  Laterality: Left;  . FEMORAL-POPLITEAL BYPASS GRAFT Left 10/07/2013   Procedure: LEFT FEMORAL-POPLITEAL ARTERY BYPASS GRAFT;  Surgeon: Angelia Mould, MD;  Location: [facility];  Service: Vascular;  Laterality: Left;  . FRACTURE SURGERY    . INTRAOPERATIVE ARTERIOGRAM Left 10/07/2013   Procedure: INTRA OPERATIVE ARTERIOGRAM LEFT LEG;  Surgeon: Angelia Mould, MD;  Location: [facility];  Service: Vascular;  Laterality: Left;  . LEFT AND RIGHT HEART CATHETERIZATION WITH  CORONARY/GRAFT ANGIOGRAM N/A 03/27/2013   Procedure: LEFT AND RIGHT HEART CATHETERIZATION WITH Beatrix Fetters;  Surgeon: Burnell Blanks, MD;  Location: [facility];  Service: Cardiovascular;  Laterality: N/A;  . LOWER EXTREMITY ANGIOGRAM  09/08/2012; 10/06/2013   "found 100% blockage; unsuccessful attempt at crossing a chronic total occlusion of the left SFA in the setting of critical limb ischemia  . LOWER EXTREMITY ANGIOGRAM Bilateral 09/08/2013   Procedure: LOWER EXTREMITY ANGIOGRAM;  Surgeon: [name], MD;  Location: [facility]  [facility];  Service: Cardiovascular;  Laterality: Bilateral;  . TOE SURGERY Left    "put pin in 2nd toe"  . TUBAL LIGATION    . WRIST FRACTURE SURGERY Left    "grafted bone from hip to wrist"    Allergies: Other; Strawberry extract; Sulfa antibiotics; Tape; and Coffee bean extract [coffea arabica]  Medications: Prior to Admission medications   Medication Sig Start Date End Date Taking? Authorizing Provider  acetaminophen (TYLENOL) 500 MG tablet Take 500-1,000 mg by mouth every 6 (six) hours as needed for mild pain or moderate pain.    Yes Historical Provider, MD  albuterol (PROVENTIL HFA;VENTOLIN HFA) 108 (90 BASE) MCG/ACT inhaler Inhale 2 puffs into the lungs daily as needed for wheezing or shortness of breath.    Yes Historical Provider, MD  amLODipine (NORVASC) 10 MG tablet TAKE ONE TABLET BY MOUTH ONCE DAILY Patient taking differently: Take 10 mg by mouth in the morning 08/16/15  Yes Satira Sark, MD  aspirin 81 MG EC tablet Take 81 mg by mouth daily. 03/29/13  Yes Brittainy Erie Noe, PA-C  atorvastatin (LIPITOR) 40 MG tablet Take 40 mg by mouth daily at 6 PM. 03/29/13  Yes Brittainy Erie Noe, PA-C  citalopram (CELEXA) 40 MG tablet Take 40 mg by mouth every morning.  05/20/15  Yes Historical Provider, MD  clopidogrel (PLAVIX) 75 MG tablet TAKE ONE TABLET BY MOUTH ONCE DAILY WITH BREAKFAST Patient taking differently: Take 75 mg by mouth in the morning 04/12/15  Yes Satira Sark, MD  Docusate Calcium (STOOL SOFTENER PO) Take 200 mg by mouth at bedtime.    Yes Historical Provider, MD  gabapentin (NEURONTIN) 300 MG capsule Take 300-600 mg by mouth See admin instructions. 300 mg in the morning and 600 mg prior to bed(time) 08/02/14  Yes Historical Provider, MD  hydrALAZINE (APRESOLINE) 25 MG tablet Take 25 mg by mouth 3 (three) times daily.   Yes Historical Provider, MD  HYDROcodone-acetaminophen (NORCO/VICODIN) 5-325 MG tablet Take 1 tablet by mouth every 6 (six) hours as needed for moderate  pain. 10/19/14  Yes Costin Karlyne Greenspan, MD  insulin aspart (NOVOLOG) 100 UNIT/ML injection Inject 3 Units into the skin 3 (three) times daily with meals. Patient taking differently: Inject 3-7 Units into the skin 3 (three) times daily after meals. Per sliding scale 10/19/14  Yes Costin Karlyne Greenspan, MD  insulin detemir (LEVEMIR) 100 UNIT/ML injection Inject 7 Units into the skin at bedtime.   Yes Historical Provider, MD  isosorbide mononitrate (IMDUR) 30 MG 24 hr tablet TAKE ONE TABLET BY MOUTH ONCE DAILY Patient taking differently: Take 30 mg by mouth in the evening 09/10/15  Yes Satira Sark, MD  Multiple Vitamin (MULTIVITAMIN WITH MINERALS) TABS tablet Take 1 tablet by mouth daily.   Yes Historical Provider, MD  Naproxen Sod-Diphenhydramine (ALEVE PM) 220-25 MG TABS Take 1-2 tablets by mouth at bedtime as needed (for sleep and/or pain).   Yes Historical Provider, MD  nitroGLYCERIN (NITROSTAT) 0.4 MG  SL tablet Place 1 tablet (0.4 mg total) under the tongue every 5 (five) minutes as needed for chest pain. Up to 3 doses. If no relief after 3rd dose, proceed to the ED for an evaluation 09/30/14  Yes Satira Sark, MD  torsemide (DEMADEX) 20 MG tablet Take 2 tablets (40 mg total) by mouth daily. Patient taking differently: Take 40 mg by mouth every morning.  08/01/15  Yes Donne Hazel, MD     Family History  Problem Relation Age of Onset  . Coronary artery disease Father     Died with MI age 77  . Heart disease Father     before age 56  . Heart attack Father   . Diabetes type II Mother   . Hypertension Mother   . Diabetes Mother   . Heart disease Mother   . Hyperlipidemia Mother   . Heart failure Sister   . Cancer Sister   . Cancer Brother     Lung cancer  . Diabetes Daughter   . Hyperlipidemia Daughter   . Diabetes Son   . Hyperlipidemia Son     Social History   Social History  . Marital status: Widowed    Spouse name: N/A  . Number of children: N/A  . Years of education:  N/A   Social History Main Topics  . Smoking status: Former Smoker    Packs/day: 3.00    Years: 50.00    Types: Cigarettes    Start date: 01/24/1956    Quit date: 10/09/2009  . Smokeless tobacco: Never Used  . Alcohol use 0.0 oz/week     Comment: 10/06/2013 "might have a drink q 2-3 months"  . Drug use:     Types: Marijuana     Comment: "smoked pot in my teens"  . Sexual activity: Not Currently   Other Topics Concern  . None   Social History Narrative   Lives in [place] by herself.  She does not work.    Review of Systems: A 12 point ROS discussed Review of Systems  Constitutional: Positive for activity change and fatigue. Negative for appetite change, chills and fever.  HENT: Negative.   Respiratory: Positive for cough and shortness of breath.   Cardiovascular: Negative for chest pain.  Gastrointestinal: Negative for abdominal pain, nausea and vomiting.  Genitourinary: Negative.   Musculoskeletal: Negative.   Skin: Negative.   Neurological: Negative.   Hematological: Negative.   Psychiatric/Behavioral: Negative.     Vital Signs: BP (!) 138/35 (BP Location: Left Arm)   Pulse 62   Temp 98.5 F (36.9 C) (Oral)   Resp 18   Ht 5' 6" (1.676 m)   Wt 189 lb 11.2 oz (86 kg) Comment: scale c  SpO2 95%   BMI 30.62 kg/m   Physical Exam  Constitutional: She is oriented to person, place, and time.  Obese, NAD  HENT:  Head: Normocephalic and atraumatic.  Eyes: EOM are normal.  Neck: Normal range of motion.  Cardiovascular: Normal rate and regular rhythm.   Murmur heard. Abdominal: Soft. She exhibits no distension. There is no tenderness.  Musculoskeletal: Normal range of motion.  Neurological: She is alert and oriented to person, place, and time.  Skin: Skin is warm and dry.  Psychiatric: She has a normal mood and affect. Her behavior is normal. Judgment and thought content normal.  Vitals reviewed. Pleural catheter in place right chest.   Mallampati Score:  MD  Evaluation Airway: WNL Heart: Other (comments) Heart  comments: +  Murmur Abdomen: WNL Chest/ Lungs: WNL ASA  Classification: 3 Mallampati/Airway Score: One  Imaging: Dg Chest 2 View  Result Date: 09/15/2015 CLINICAL DATA:  Shortness of breath/dyspnea and edema for 3 days. EXAM: CHEST  2 VIEW COMPARISON:  08/16/2015 FINDINGS: Stable changes from previous CABG surgery. The cardiac silhouette is mildly enlarged. No mediastinal or hilar masses or evidence of adenopathy. There is vascular congestion with interstitial thickening, the latter most prominent in the lower lungs. Pleural-based opacities noted along the right lateral mid to lower lung. Appearance of the lungs is similar to the prior exam. There are small bilateral pleural effusions, new or increased when compared to the prior chest radiographs. Pleural drain curls along the dome of the right hemidiaphragm, unchanged. No pneumothorax. Skeletal structures are demineralized but grossly intact. IMPRESSION: 1. Findings support mild congestive heart failure. Small pleural effusions new or increased when compared the prior exam. No other change. Electronically Signed   By: Lajean Manes M.D.   On: 09/15/2015 09:11   Dg Chest Port 1 View  Result Date: 09/16/2015 CLINICAL DATA:  Hypoxia, history of CHF, coronary artery disease with CABG, hypertension, diabetes, former very heavy smoker discontinued in 2011. EXAM: PORTABLE CHEST 1 VIEW COMPARISON:  PA and lateral chest x-ray of August 15, 2015 FINDINGS: The lungs are adequately inflated. The interstitial markings are increased bilaterally. There is a small amount of pleural fluid versus pleural thickening on the right. The cardiac silhouette is enlarged. The pulmonary vascularity is mildly engorged. There is calcification in the wall of the aortic arch. The patient has undergone previous CABG. IMPRESSION: CHF with mild pulmonary interstitial edema. Underlying COPD. Small right pleural effusion versus pleural  thickening. Electronically Signed   By: David  Martinique M.D.   On: 09/16/2015 14:53    Labs:  CBC:  Recent Labs  09/14/15 1654 09/16/15 0212 09/17/15 0349 09/18/15 0146  WBC 9.3 7.7 7.8 7.6  HGB 10.5* 9.3* 9.8* 9.3*  HCT 35.3* 31.8* 34.4* 33.2*  PLT 227 213 216 226    COAGS:  Recent Labs  10/15/14 0408 09/16/15 0212  INR 1.35 1.39  APTT 38*  --     BMP:  Recent Labs  09/17/15 0349 09/18/15 0146 09/19/15 0252 09/20/15 0205  NA 141 136 136 137  K 4.7 4.0 4.2 4.5  CL 99* 95* 94* 93*  CO2 34* 34* 33* 34*  GLUCOSE 147* 271* 162* 163*  BUN 55* 54* 57* 62*  CALCIUM 9.3 9.0 9.3 9.4  CREATININE 1.57* 1.70* 1.74* 1.73*  GFRNONAA 33* 30* 29* 29*  GFRAA 38* 35* 34* 34*    LIVER FUNCTION TESTS:  Recent Labs  10/12/14 0444 09/14/15 1654  BILITOT 0.7 0.9  AST 24 20  ALT 23 18  ALKPHOS 69 82  PROT 6.4* 7.8  ALBUMIN 2.4* 3.8    TUMOR MARKERS: No results for input(s): AFPTM, CEA, CA199, CHROMGRNA in the last 8760 hours.  Assessment and Plan:  Acute on chronic heart failure with volume overload  PleurX in place which is no longer draining per patient. She would like to have it removed.  Last dose of Plavix 9/6  Will proceed with removal of tunneled pleural catheter tomorrow.  Risks and Benefits discussed with the patient including, but not limited to bleeding, infection, pneumothorax, and need for additional procedures.  All of the patient's questions were answered, patient is agreeable to proceed. Consent signed and in chart.  Thank you for this interesting consult.  I greatly enjoyed meeting Harly Pipkins  Pavon and look forward to participating in their care.  A copy of this report was sent to the requesting provider on this date.  Electronically Signed: Murrell Redden PA-C 09/20/2015, 11:25 AM   I spent a total of 40 Minutes in face to face in clinical consultation, greater than 50% of which was counseling/coordinating care for removal of tunneled pleural  catheter.

## 2015-09-21 ENCOUNTER — Inpatient Hospital Stay (HOSPITAL_COMMUNITY): Payer: Medicare Other

## 2015-09-21 ENCOUNTER — Encounter (HOSPITAL_COMMUNITY): Payer: Self-pay | Admitting: Interventional Radiology

## 2015-09-21 DIAGNOSIS — J90 Pleural effusion, not elsewhere classified: Secondary | ICD-10-CM

## 2015-09-21 HISTORY — PX: IR GENERIC HISTORICAL: IMG1180011

## 2015-09-21 LAB — GLUCOSE, CAPILLARY
GLUCOSE-CAPILLARY: 144 mg/dL — AB (ref 65–99)
GLUCOSE-CAPILLARY: 152 mg/dL — AB (ref 65–99)

## 2015-09-21 LAB — BASIC METABOLIC PANEL
Anion gap: 10 (ref 5–15)
BUN: 67 mg/dL — AB (ref 6–20)
CALCIUM: 9.2 mg/dL (ref 8.9–10.3)
CO2: 33 mmol/L — ABNORMAL HIGH (ref 22–32)
CREATININE: 1.75 mg/dL — AB (ref 0.44–1.00)
Chloride: 93 mmol/L — ABNORMAL LOW (ref 101–111)
GFR, EST AFRICAN AMERICAN: 33 mL/min — AB (ref >60)
GFR, EST NON AFRICAN AMERICAN: 29 mL/min — AB (ref >60)
Glucose, Bld: 129 mg/dL — ABNORMAL HIGH (ref 65–99)
Potassium: 4 mmol/L (ref 3.5–5.1)
SODIUM: 136 mmol/L (ref 135–145)

## 2015-09-21 MED ORDER — LIDOCAINE HCL 1 % IJ SOLN
INTRAMUSCULAR | Status: AC
  Administered 2015-09-21: 10 mL
  Filled 2015-09-21: qty 20

## 2015-09-21 MED ORDER — SPIRONOLACTONE 25 MG PO TABS: 12.5000 mg | ORAL_TABLET | Freq: Every day | ORAL | 6 refills | Status: DC

## 2015-09-21 MED ORDER — CHLORHEXIDINE GLUCONATE 4 % EX LIQD
CUTANEOUS | Status: AC
  Administered 2015-09-21: 10:00:00
  Filled 2015-09-21: qty 15

## 2015-09-21 MED ORDER — HYDRALAZINE HCL 25 MG PO TABS: 50.0000 mg | ORAL_TABLET | Freq: Three times a day (TID) | ORAL | 6 refills | Status: DC

## 2015-09-21 MED ORDER — UMECLIDINIUM BROMIDE 62.5 MCG/INH IN AEPB: 1.0000 | INHALATION_SPRAY | Freq: Every day | RESPIRATORY_TRACT | Status: DC

## 2015-09-21 MED ORDER — TORSEMIDE 20 MG PO TABS: 40.0000 mg | ORAL_TABLET | Freq: Two times a day (BID) | ORAL | 0 refills | Status: DC

## 2015-09-21 MED ORDER — METHIMAZOLE 10 MG PO TABS: 10.0000 mg | ORAL_TABLET | Freq: Every day | ORAL | 3 refills | Status: DC

## 2015-09-21 NOTE — Progress Notes (Signed)
Late Entry. Pt alert and oriented. Discharged with daughter, transported to car by wheelchair. Pt discharged with oxygen from home on 2L/minute. Pt and daughter verbalized understanding of discharge teaching.

## 2015-09-21 NOTE — Progress Notes (Signed)
Alerted donna w adv homecare of pt's dc set for today. Pt states has alleq she needs.

## 2015-09-21 NOTE — Progress Notes (Signed)
I cosign [name] [name]'s assessment, I's & O's, medication administration, and progress notes.

## 2015-09-21 NOTE — Progress Notes (Signed)
Pt with 2 episodes of urinary incontinence this AM. Pt also with urinary urgency, so pt unable to hold her urine despite quick response from this RN after pt called to use the restroom. Pt was sat on the bedside commode after each incontinent episode in case pt could further empty her bladder (pt voided 166m after her 2nd incontinent episode). Will continue to monitor.

## 2015-09-21 NOTE — Discharge Summary (Signed)
Advanced Heart Failure Discharge Note   Discharge Summary   Patient ID: [name] [name] MRN: [id number], DOB/AGE: March 26, 1945 70 y.o. Admit date: 09/14/2015 D/C date:     09/21/2015   Primary Discharge Diagnoses:  1. A/C diastolic CHF 2. CKD stage III 3. CAD s/p CABG - H/o PCI to SVG-D in 9/15 4. Recurrent Right Pleural Effusion 5. Suspected OSA 6. PAD - ABI 0.5 in R foot 7. Hyperthyroidism 8. Chronic Hypoxemic Respiratory Failure 9. Hip Pain  Hospital Course:   [name] [name] is a 70 y.o. with chronic diastolic CHF, CAD s/p CABG, chronic right pleural effusion, CKD stage III, and PAD presents for CHF clinic evaluation. She has had multiple admissions with diastolic CHF complicated by CKD. Most recent admission was in 7/17 with admission for diuresis. Worsening renal function was noted. Lisinopril was stopped with elevated creatinine and Coreg was stopped with bradycardia. Last echo in 7/17 showed EF 55-60% with grade II diastolic dysfunction and D-shaped interventricular septum. She has been wearing home oxygen for >1 year.  Admitted from clinic 09/14/15 with marked volume overload and 15 lbs weight gain in one week. Admitted for IV diuresis and monitoring of renal function. Started on Lasix 80 mg IV TID.     [place] 09/16/15 with elevated filling pressures and primarily pulmonary venous HTN with PVR 3 WU. Cardiac output stable. Continued to diurese.   Hospital course complicated by oxygen desaturation into 70s on RA (Pt on 02 at home). Requiring 02 up to 5 L.  Was given dose of lasix, hydralazin, and morphine with back pain + SBP in 160s. Repeat CXR showed mild interstitial edema with underlying COPD.   Pt also started on methimazole for Hyperthyroidism, and will require Endocrine follow up. Pt was given morphine prn for breakthrough chronic hip pain.   Pt has relatively recent history of R pleural effusion s/p R. Pleurx catheter that remained in place on admission.  It had recently stopped  draining. CXR 2 view showed small pleural effusion, but with Pleurx no longer draining, it was removed by IR 09/21/15 after holding plavix for 5 days.  Will recheck CXR in 1 week.  If re-accumulating may need replacement.   Overall diuresed 8.4 L and down 22 lbs from admission. She will go home on torsemide 40 mg BID.   She will discharged to home in stable condition with [facility] for [facility] and close follow up as below.  She will also need follow up with Dr. Gwenlyn Found for decreased ABIs and Endocrine as above. Will arrange as outpatient.   Discharge Weight Range:  Discharge Vitals: Blood pressure (!) 142/42, pulse (!) 57, temperature 98.4 F (36.9 C), temperature source Oral, resp. rate 16, height _0  (1.676 m), weight 189 lb 14.4 oz (86.1 kg), SpO2 96 %.  Labs: Lab Results  Component Value Date   WBC 7.6 09/18/2015   HGB 9.3 (L) 09/18/2015   HCT 33.2 (L) 09/18/2015   MCV 91.0 09/18/2015   PLT 226 09/18/2015    Recent Labs Lab 09/14/15 1654  09/21/15 0432  NA 135  < > 136  K 4.4  < > 4.0  CL 98*  < > 93*  CO2 28  < > 33*  BUN 81*  < > 67*  CREATININE 1.99*  < > 1.75*  CALCIUM 9.0  < > 9.2  PROT 7.8  --   --   BILITOT 0.9  --   --   ALKPHOS 82  --   --  ALT 18  --   --   AST 20  --   --   GLUCOSE 147*  < > 129*  < > = values in this interval not displayed. Lab Results  Component Value Date   CHOL 114 10/13/2010   HDL 31 (L) 10/13/2010   LDLCALC 64 10/13/2010   TRIG 96 10/13/2010   BNP (last 3 results)  Recent Labs  07/23/15 1629 09/14/15 1654 09/20/15 0205  BNP 654.0* 637.9* 200.6*    ProBNP (last 3 results) No results for input(s): PROBNP in the last 8760 hours.   Diagnostic Studies/Procedures   [facility] Procedural Findings (9/7): Hemodynamics (mmHg) RA mean 15 RV 76/18 PA 73/26, mean 42 PCWP mean 25 Oxygen saturations: PA 56% AO 93% Cardiac Output (Fick) 5.68  Cardiac Index (Fick) 2.84 PVR 3.0 WU  Discharge Medications     Medication List    STOP taking  these medications   ALEVE PM 220-25 MG Tabs Generic drug:  Naproxen Sod-Diphenhydramine     TAKE these medications   acetaminophen 500 MG tablet Commonly known as:  TYLENOL Take 500-1,000 mg by mouth every 6 (six) hours as needed for mild pain or moderate pain.   albuterol 108 (90 Base) MCG/ACT inhaler Commonly known as:  PROVENTIL HFA;VENTOLIN HFA Inhale 2 puffs into the lungs daily as needed for wheezing or shortness of breath.   amLODipine 10 MG tablet Commonly known as:  NORVASC TAKE ONE TABLET BY MOUTH ONCE DAILY What changed:  See the new instructions.   aspirin 81 MG EC tablet Take 81 mg by mouth daily.   atorvastatin 40 MG tablet Commonly known as:  LIPITOR Take 40 mg by mouth daily at 6 PM.   citalopram 40 MG tablet Commonly known as:  CELEXA Take 40 mg by mouth every morning.   clopidogrel 75 MG tablet Commonly known as:  PLAVIX TAKE ONE TABLET BY MOUTH ONCE DAILY WITH BREAKFAST What changed:  See the new instructions.   gabapentin 300 MG capsule Commonly known as:  NEURONTIN Take 300-600 mg by mouth See admin instructions. 300 mg in the morning and 600 mg prior to bed(time)   hydrALAZINE 25 MG tablet Commonly known as:  APRESOLINE Take 2 tablets (50 mg total) by mouth 3 (three) times daily. What changed:  how much to take   HYDROcodone-acetaminophen 5-325 MG tablet Commonly known as:  NORCO/VICODIN Take 1 tablet by mouth every 6 (six) hours as needed for moderate pain.   insulin aspart 100 UNIT/ML injection Commonly known as:  novoLOG Inject 3 Units into the skin 3 (three) times daily with meals. What changed:  how much to take  when to take this  additional instructions   isosorbide mononitrate 30 MG 24 hr tablet Commonly known as:  IMDUR TAKE ONE TABLET BY MOUTH ONCE DAILY What changed:  See the new instructions.   LEVEMIR 100 UNIT/ML injection Generic drug:  insulin detemir Inject 7 Units into the skin at bedtime.   methimazole 10 MG  tablet Commonly known as:  TAPAZOLE Take 1 tablet (10 mg total) by mouth daily.   multivitamin with minerals Tabs tablet Take 1 tablet by mouth daily.   nitroGLYCERIN 0.4 MG SL tablet Commonly known as:  NITROSTAT Place 1 tablet (0.4 mg total) under the tongue every 5 (five) minutes as needed for chest pain. Up to 3 doses. If no relief after 3rd dose, proceed to the ED for an evaluation   spironolactone 25 MG tablet Commonly known as:  ALDACTONE Take 0.5 tablets (12.5 mg total) by mouth daily.   STOOL SOFTENER PO Take 200 mg by mouth at bedtime.   torsemide 20 MG tablet Commonly known as:  DEMADEX Take 2 tablets (40 mg total) by mouth 2 (two) times daily. What changed:  when to take this   umeclidinium bromide 62.5 MCG/INH Aepb Commonly known as:  INCRUSE ELLIPTA Inhale 1 puff into the lungs daily.       Disposition   The patient will be discharged in stable condition to home with follow up as below.  Discharge Instructions    (HEART FAILURE PATIENTS) Call MD:  Anytime you have any of the following symptoms: 1) 3 pound weight gain in 24 hours or 5 pounds in 1 week 2) shortness of breath, with or without a dry hacking cough 3) swelling in the hands, feet or stomach 4) if you have to sleep on extra pillows at night in order to breathe.    Complete by:  As directed   Diet - low sodium heart healthy    Complete by:  As directed   Heart Failure patients record your daily weight using the same scale at the same time of day    Complete by:  As directed   Increase activity slowly    Complete by:  As directed     Follow-up Information    Quay Burow, MD. Call today.   Specialties:  Cardiology, Radiology Contact information: [address] [place] [address] [phone]        Loralie Champagne, MD Follow up on 09/29/2015.   Specialty:  Cardiology Why:  at 130 for post hospital follow up. Code for parking is 0030. Please bring ALL of your medications to your  visit.  Contact information: [address] [place] [address] [phone]             Duration of Discharge Encounter: Greater than 35 minutes   Signed, Shirley Friar PA-C 09/21/2015, 12:44 PM  She is stable. CXRs reviewed personally and seems to have some re-accumulation of effusion. Will d/c home to day with close f/u. See next week in clinic with repeat CXR. If effusion enlarging may need to consider pleurodesis or decortication.   Bensimhon, Daniel,MD 10:00 PM

## 2015-09-21 NOTE — Progress Notes (Signed)
Patient ID: [name] [name], female   DOB: October 01, 1945, 70 y.o.   MRN: [id number]    Advanced Heart Failure Rounding Note   Subjective:    Admitted with marked volume overload. Diuresed with IV lasix and transitioned to torsemide.  Weight now down a total of 22 lbs. AHC set up. Ambulated 600 feet.   Denies SOB.  RHC Procedural Findings (9/7): Hemodynamics (mmHg) RA mean 15 RV 76/18 PA 73/26, mean 42 PCWP mean 25 Oxygen saturations: PA 56% AO 93% Cardiac Output (Fick) 5.68  Cardiac Index (Fick) 2.84 PVR 3.0 WU  Objective:   Weight Range:  Vital Signs:   Temp:  [98.3 F (36.8 C)-98.6 F (37 C)] 98.3 F (36.8 C) (09/12 0443) Pulse Rate:  [57-60] 57 (09/12 0443) Resp:  [18] 18 (09/12 0443) BP: (135-144)/(40-45) 144/40 (09/12 0443) SpO2:  [94 %-98 %] 94 % (09/12 0755) Weight:  [189 lb 14.4 oz (86.1 kg)] 189 lb 14.4 oz (86.1 kg) (09/12 0443) Last BM Date: 09/15/15  Weight change: Filed Weights   09/19/15 0447 09/20/15 0530 09/21/15 0443  Weight: 192 lb 1.6 oz (87.1 kg) 189 lb 11.2 oz (86 kg) 189 lb 14.4 oz (86.1 kg)    Intake/Output:   Intake/Output Summary (Last 24 hours) at 09/21/15 0807 Last data filed at 09/21/15 0559  Gross per 24 hour  Intake              960 ml  Output              450 ml  Net              510 ml     Physical Exam: General:  Well appearing. No resp difficulty. In bed.  HEENT: normal Neck: supple. JVP 5-6 Carotids 2+ bilat; no bruits. No lymphadenopathy or thryomegaly appreciated. Cor: PMI nondisplaced. Regular rate & rhythm. No rubs, gallops or murmurs. Lungs: clear on 2 liters. R pleurex catheter Abdomen: soft, nontender, nondistended. No hepatosplenomegaly. No bruits or masses. Good bowel sounds. Extremities: no cyanosis, clubbing, rash.  No edema.  Neuro: alert & orientedx3, cranial nerves grossly intact. moves all 4 extremities w/o difficulty. Affect pleasant  Telemetry: NSR  Labs: Basic Metabolic Panel:  Recent Labs Lab  09/14/15 1654  09/17/15 0349 09/18/15 0146 09/19/15 0252 09/20/15 0205 09/21/15 0432  NA 135  < > 141 136 136 137 136  K 4.4  < > 4.7 4.0 4.2 4.5 4.0  CL 98*  < > 99* 95* 94* 93* 93*  CO2 28  < > 34* 34* 33* 34* 33*  GLUCOSE 147*  < > 147* 271* 162* 163* 129*  BUN 81*  < > 55* 54* 57* 62* 67*  CREATININE 1.99*  < > 1.57* 1.70* 1.74* 1.73* 1.75*  CALCIUM 9.0  < > 9.3 9.0 9.3 9.4 9.2  MG 3.1*  --   --   --   --  3.0*  --   < > = values in this interval not displayed.  Liver Function Tests:  Recent Labs Lab 09/14/15 1654  AST 20  ALT 18  ALKPHOS 82  BILITOT 0.9  PROT 7.8  ALBUMIN 3.8   No results for input(s): LIPASE, AMYLASE in the last 168 hours. No results for input(s): AMMONIA in the last 168 hours.  CBC:  Recent Labs Lab 09/14/15 1654 09/16/15 0212 09/17/15 0349 09/18/15 0146  WBC 9.3 7.7 7.8 7.6  NEUTROABS 7.6  --   --   --   HGB 10.5*  9.3* 9.8* 9.3*  HCT 35.3* 31.8* 34.4* 33.2*  MCV 90.3 90.3 92.0 91.0  PLT 227 213 216 226    Cardiac Enzymes: No results for input(s): CKTOTAL, CKMB, CKMBINDEX, TROPONINI in the last 168 hours.  BNP: BNP (last 3 results)  Recent Labs  07/23/15 1629 09/14/15 1654 09/20/15 0205  BNP 654.0* 637.9* 200.6*    ProBNP (last 3 results) No results for input(s): PROBNP in the last 8760 hours.    Other results:  Imaging: No results found.   Medications:     Scheduled Medications: . amLODipine  10 mg Oral Daily  . umeclidinium bromide  1 puff Inhalation Daily   And  . arformoterol  15 mcg Nebulization BID  . aspirin EC  81 mg Oral Daily  . atorvastatin  40 mg Oral q1800  . citalopram  40 mg Oral Daily  . gabapentin  300 mg Oral QHS  . heparin  5,000 Units Subcutaneous Q8H  . hydrALAZINE  50 mg Oral TID  . insulin aspart  0-15 Units Subcutaneous TID WC  . insulin aspart  3 Units Subcutaneous TID WC  . isosorbide mononitrate  30 mg Oral Daily  . methimazole  10 mg Oral Daily  . multivitamin with minerals   1 tablet Oral Daily  . sodium chloride flush  3 mL Intravenous Q12H  . sodium chloride flush  3 mL Intravenous Q12H  . sodium chloride flush  3 mL Intravenous Q12H  . spironolactone  12.5 mg Oral Daily  . torsemide  40 mg Oral BID    Infusions:    PRN Medications: sodium chloride, sodium chloride, sodium chloride, acetaminophen, acetaminophen, albuterol, docusate sodium, HYDROcodone-acetaminophen, morphine injection, morphine injection, nitroGLYCERIN, ondansetron (ZOFRAN) IV, sodium chloride flush, sodium chloride flush, sodium chloride flush   Assessment/Plan   1. Acute on chronic diastolic CHF: I suspect that there is a significant component of RV failure as well. Echo 7/17 with moderate LVH, EF 55-60%, D-shaped septum, PASP 52 mmHg.  15 lb weight gain over the last week or so prior to admission. 9/7 RHC showed ongoing volume overload with primarily pulmonary venous hypertension.  Diuresis is complicated by CKD stage III, suspect cardiorenal syndrome.   Appears euvolemic.  Overall diuresed 22 pounds.  - Continue  torsemide 40 mg po bid.  2. CKD stage III: Follow creatinine closely with diuresis. Creatinine leveled off 1.7. Holding losartan with creatinine mildly up.  3. CAD: s/p CABG. No chest pain. H/o PCI to SVG-D in 9/15.  - Continue ASA 81, statin. - Holding Plavix so Pleurx can be removed => has been off plavix for 5 days. Restart after pleurex catheter removed today.  4. Recurrent right pleural effusion: Right Pleurx catheter no longer draining. CXR PA/lat: Pleural effusion resolved.  Off plavix until pleurex catheter removed today.   5. Suspect OSA: Will need sleep study as outpatient.  6. PAD: ABI 0.5 in right foot. Needs followup with Dr. Gwenlyn Found after discharge.  7. Hyperthyroidism: Started on methimazole. Will need to refer to endocrinology.  8. Chronic hypoxemic respiratory failure: Home oxygen > 1 year, ?component of OHS. Can turn oxygen back down to her home 2L.  9. Hip  pain: Has this chronically but flaring.  Getting vicodin, Tylenol.  Will let her have morphine for breakthrough.   AHC set up for [place]. Already has home O2. Will try telemonitoring + diuretic protocol.   Home later today.   Length of Stay: [place] NP-C  09/21/2015,  8:07 AM  Advanced Heart Failure Team Pager [phone] (M-F; 7a - 4p)  Please contact [facility] for night-coverage after hours (4p -7a ) and weekends on [email or web address]  Patient seen and examined with Darrick Grinder, NP. We discussed all aspects of the encounter. I agree with the assessment and plan as stated above.   She is stable. CXRs reviewed personally and seems to have some re-accumulation of effusion. Will d/c home to day with close f/u. See next week in clinic with repeat CXR. If effusion enlarging may need to consider pleurodesis or decortication.   Jujuan Dugo,MD 10:00 PM

## 2015-09-21 NOTE — Procedures (Signed)
R pleurex drain removal. No comp/EBL

## 2015-09-22 DIAGNOSIS — I5033 Acute on chronic diastolic (congestive) heart failure: Secondary | ICD-10-CM | POA: Diagnosis not present

## 2015-09-22 DIAGNOSIS — N183 Chronic kidney disease, stage 3 (moderate): Secondary | ICD-10-CM | POA: Diagnosis not present

## 2015-09-22 DIAGNOSIS — J9621 Acute and chronic respiratory failure with hypoxia: Secondary | ICD-10-CM | POA: Diagnosis not present

## 2015-09-22 DIAGNOSIS — I130 Hypertensive heart and chronic kidney disease with heart failure and stage 1 through stage 4 chronic kidney disease, or unspecified chronic kidney disease: Secondary | ICD-10-CM | POA: Diagnosis not present

## 2015-09-22 DIAGNOSIS — E1122 Type 2 diabetes mellitus with diabetic chronic kidney disease: Secondary | ICD-10-CM | POA: Diagnosis not present

## 2015-09-22 DIAGNOSIS — E1165 Type 2 diabetes mellitus with hyperglycemia: Secondary | ICD-10-CM | POA: Diagnosis not present

## 2015-09-23 DIAGNOSIS — E1165 Type 2 diabetes mellitus with hyperglycemia: Secondary | ICD-10-CM | POA: Diagnosis not present

## 2015-09-23 DIAGNOSIS — I130 Hypertensive heart and chronic kidney disease with heart failure and stage 1 through stage 4 chronic kidney disease, or unspecified chronic kidney disease: Secondary | ICD-10-CM | POA: Diagnosis not present

## 2015-09-23 DIAGNOSIS — E1122 Type 2 diabetes mellitus with diabetic chronic kidney disease: Secondary | ICD-10-CM | POA: Diagnosis not present

## 2015-09-23 DIAGNOSIS — N183 Chronic kidney disease, stage 3 (moderate): Secondary | ICD-10-CM | POA: Diagnosis not present

## 2015-09-23 DIAGNOSIS — J9621 Acute and chronic respiratory failure with hypoxia: Secondary | ICD-10-CM | POA: Diagnosis not present

## 2015-09-23 DIAGNOSIS — I5033 Acute on chronic diastolic (congestive) heart failure: Secondary | ICD-10-CM | POA: Diagnosis not present

## 2015-09-24 DIAGNOSIS — E1165 Type 2 diabetes mellitus with hyperglycemia: Secondary | ICD-10-CM | POA: Diagnosis not present

## 2015-09-24 DIAGNOSIS — E1122 Type 2 diabetes mellitus with diabetic chronic kidney disease: Secondary | ICD-10-CM | POA: Diagnosis not present

## 2015-09-24 DIAGNOSIS — N183 Chronic kidney disease, stage 3 (moderate): Secondary | ICD-10-CM | POA: Diagnosis not present

## 2015-09-24 DIAGNOSIS — I5033 Acute on chronic diastolic (congestive) heart failure: Secondary | ICD-10-CM | POA: Diagnosis not present

## 2015-09-24 DIAGNOSIS — J9621 Acute and chronic respiratory failure with hypoxia: Secondary | ICD-10-CM | POA: Diagnosis not present

## 2015-09-24 DIAGNOSIS — I130 Hypertensive heart and chronic kidney disease with heart failure and stage 1 through stage 4 chronic kidney disease, or unspecified chronic kidney disease: Secondary | ICD-10-CM | POA: Diagnosis not present

## 2015-09-25 DIAGNOSIS — E1165 Type 2 diabetes mellitus with hyperglycemia: Secondary | ICD-10-CM | POA: Diagnosis not present

## 2015-09-25 DIAGNOSIS — I130 Hypertensive heart and chronic kidney disease with heart failure and stage 1 through stage 4 chronic kidney disease, or unspecified chronic kidney disease: Secondary | ICD-10-CM | POA: Diagnosis not present

## 2015-09-25 DIAGNOSIS — E1122 Type 2 diabetes mellitus with diabetic chronic kidney disease: Secondary | ICD-10-CM | POA: Diagnosis not present

## 2015-09-25 DIAGNOSIS — J9621 Acute and chronic respiratory failure with hypoxia: Secondary | ICD-10-CM | POA: Diagnosis not present

## 2015-09-25 DIAGNOSIS — N183 Chronic kidney disease, stage 3 (moderate): Secondary | ICD-10-CM | POA: Diagnosis not present

## 2015-09-25 DIAGNOSIS — I5033 Acute on chronic diastolic (congestive) heart failure: Secondary | ICD-10-CM | POA: Diagnosis not present

## 2015-09-27 DIAGNOSIS — I130 Hypertensive heart and chronic kidney disease with heart failure and stage 1 through stage 4 chronic kidney disease, or unspecified chronic kidney disease: Secondary | ICD-10-CM | POA: Diagnosis not present

## 2015-09-27 DIAGNOSIS — J9621 Acute and chronic respiratory failure with hypoxia: Secondary | ICD-10-CM | POA: Diagnosis not present

## 2015-09-27 DIAGNOSIS — E1165 Type 2 diabetes mellitus with hyperglycemia: Secondary | ICD-10-CM | POA: Diagnosis not present

## 2015-09-27 DIAGNOSIS — I5033 Acute on chronic diastolic (congestive) heart failure: Secondary | ICD-10-CM | POA: Diagnosis not present

## 2015-09-27 DIAGNOSIS — N183 Chronic kidney disease, stage 3 (moderate): Secondary | ICD-10-CM | POA: Diagnosis not present

## 2015-09-27 DIAGNOSIS — E1122 Type 2 diabetes mellitus with diabetic chronic kidney disease: Secondary | ICD-10-CM | POA: Diagnosis not present

## 2015-09-28 DIAGNOSIS — E1122 Type 2 diabetes mellitus with diabetic chronic kidney disease: Secondary | ICD-10-CM | POA: Diagnosis not present

## 2015-09-28 DIAGNOSIS — I130 Hypertensive heart and chronic kidney disease with heart failure and stage 1 through stage 4 chronic kidney disease, or unspecified chronic kidney disease: Secondary | ICD-10-CM | POA: Diagnosis not present

## 2015-09-28 DIAGNOSIS — E1165 Type 2 diabetes mellitus with hyperglycemia: Secondary | ICD-10-CM | POA: Diagnosis not present

## 2015-09-28 DIAGNOSIS — J9621 Acute and chronic respiratory failure with hypoxia: Secondary | ICD-10-CM | POA: Diagnosis not present

## 2015-09-28 DIAGNOSIS — N183 Chronic kidney disease, stage 3 (moderate): Secondary | ICD-10-CM | POA: Diagnosis not present

## 2015-09-28 DIAGNOSIS — I5033 Acute on chronic diastolic (congestive) heart failure: Secondary | ICD-10-CM | POA: Diagnosis not present

## 2015-09-29 ENCOUNTER — Encounter (HOSPITAL_COMMUNITY): Payer: Medicare Other | Admitting: Internal Medicine

## 2015-09-29 ENCOUNTER — Ambulatory Visit (HOSPITAL_COMMUNITY)
Admission: RE | Admit: 2015-09-29 | Discharge: 2015-09-29 | Disposition: A | Discharge status: Home / Self Care | Payer: Medicare Other | Source: Ambulatory Visit | Attending: Student | Admitting: Student

## 2015-09-29 ENCOUNTER — Ambulatory Visit (HOSPITAL_COMMUNITY)
Admission: RE | Admit: 2015-09-29 | Discharge: 2015-09-29 | Disposition: A | Discharge status: Home / Self Care | Payer: Medicare Other | Source: Ambulatory Visit | Attending: Cardiology | Admitting: Cardiology

## 2015-09-29 ENCOUNTER — Encounter (HOSPITAL_COMMUNITY): Payer: Self-pay

## 2015-09-29 VITALS — BP 160/54 | HR 58 | Ht 66.0 in | Wt 188.0 lb

## 2015-09-29 DIAGNOSIS — N183 Chronic kidney disease, stage 3 unspecified: Secondary | ICD-10-CM

## 2015-09-29 DIAGNOSIS — E1165 Type 2 diabetes mellitus with hyperglycemia: Secondary | ICD-10-CM | POA: Diagnosis not present

## 2015-09-29 DIAGNOSIS — I517 Cardiomegaly: Secondary | ICD-10-CM | POA: Diagnosis not present

## 2015-09-29 DIAGNOSIS — R918 Other nonspecific abnormal finding of lung field: Secondary | ICD-10-CM | POA: Diagnosis not present

## 2015-09-29 DIAGNOSIS — Z809 Family history of malignant neoplasm, unspecified: Secondary | ICD-10-CM | POA: Diagnosis not present

## 2015-09-29 DIAGNOSIS — Z8249 Family history of ischemic heart disease and other diseases of the circulatory system: Secondary | ICD-10-CM | POA: Insufficient documentation

## 2015-09-29 DIAGNOSIS — I130 Hypertensive heart and chronic kidney disease with heart failure and stage 1 through stage 4 chronic kidney disease, or unspecified chronic kidney disease: Secondary | ICD-10-CM | POA: Diagnosis not present

## 2015-09-29 DIAGNOSIS — J90 Pleural effusion, not elsewhere classified: Secondary | ICD-10-CM

## 2015-09-29 DIAGNOSIS — Z7982 Long term (current) use of aspirin: Secondary | ICD-10-CM | POA: Insufficient documentation

## 2015-09-29 DIAGNOSIS — J9621 Acute and chronic respiratory failure with hypoxia: Secondary | ICD-10-CM | POA: Diagnosis not present

## 2015-09-29 DIAGNOSIS — I739 Peripheral vascular disease, unspecified: Secondary | ICD-10-CM

## 2015-09-29 DIAGNOSIS — Z801 Family history of malignant neoplasm of trachea, bronchus and lung: Secondary | ICD-10-CM | POA: Diagnosis not present

## 2015-09-29 DIAGNOSIS — E0590 Thyrotoxicosis, unspecified without thyrotoxic crisis or storm: Secondary | ICD-10-CM

## 2015-09-29 DIAGNOSIS — Z951 Presence of aortocoronary bypass graft: Secondary | ICD-10-CM | POA: Diagnosis not present

## 2015-09-29 DIAGNOSIS — E1122 Type 2 diabetes mellitus with diabetic chronic kidney disease: Secondary | ICD-10-CM | POA: Diagnosis not present

## 2015-09-29 DIAGNOSIS — J948 Other specified pleural conditions: Secondary | ICD-10-CM | POA: Insufficient documentation

## 2015-09-29 DIAGNOSIS — Z794 Long term (current) use of insulin: Secondary | ICD-10-CM | POA: Diagnosis not present

## 2015-09-29 DIAGNOSIS — I5033 Acute on chronic diastolic (congestive) heart failure: Secondary | ICD-10-CM | POA: Diagnosis not present

## 2015-09-29 DIAGNOSIS — Z833 Family history of diabetes mellitus: Secondary | ICD-10-CM | POA: Diagnosis not present

## 2015-09-29 DIAGNOSIS — I5042 Chronic combined systolic (congestive) and diastolic (congestive) heart failure: Secondary | ICD-10-CM | POA: Diagnosis not present

## 2015-09-29 DIAGNOSIS — E118 Type 2 diabetes mellitus with unspecified complications: Secondary | ICD-10-CM | POA: Diagnosis not present

## 2015-09-29 DIAGNOSIS — I878 Other specified disorders of veins: Secondary | ICD-10-CM | POA: Diagnosis not present

## 2015-09-29 DIAGNOSIS — I6523 Occlusion and stenosis of bilateral carotid arteries: Secondary | ICD-10-CM | POA: Insufficient documentation

## 2015-09-29 DIAGNOSIS — Z8673 Personal history of transient ischemic attack (TIA), and cerebral infarction without residual deficits: Secondary | ICD-10-CM | POA: Insufficient documentation

## 2015-09-29 LAB — BASIC METABOLIC PANEL
ANION GAP: 9 (ref 5–15)
BUN: 78 mg/dL — ABNORMAL HIGH (ref 6–20)
CO2: 27 mmol/L (ref 22–32)
Calcium: 9.1 mg/dL (ref 8.9–10.3)
Chloride: 98 mmol/L — ABNORMAL LOW (ref 101–111)
Creatinine, Ser: 1.62 mg/dL — ABNORMAL HIGH (ref 0.44–1.00)
GFR, EST AFRICAN AMERICAN: 37 mL/min — AB (ref >60)
GFR, EST NON AFRICAN AMERICAN: 32 mL/min — AB (ref >60)
GLUCOSE: 252 mg/dL — AB (ref 65–99)
POTASSIUM: 3.9 mmol/L (ref 3.5–5.1)
Sodium: 134 mmol/L — ABNORMAL LOW (ref 135–145)

## 2015-09-29 MED ORDER — HYDRALAZINE HCL 50 MG PO TABS: 75.0000 mg | ORAL_TABLET | Freq: Three times a day (TID) | ORAL | 3 refills | Status: DC

## 2015-09-29 NOTE — Patient Instructions (Signed)
INCREASE Hyrdlazine to 75 mg ( one and one half tab) three times per day  Your physician has recommended that you have a sleep study. This test records several body functions during sleep, including: brain activity, eye movement, oxygen and carbon dioxide blood levels, heart rate and rhythm, breathing rate and rhythm, the flow of air through your mouth and nose, snoring, body muscle movements, and chest and belly movement.  You have been referred to [facility]- for further evaluation of hyperthyroidism   A chest x-ray takes a picture of the organs and structures inside the chest, including the heart, lungs, and blood vessels. This test can show several things, including, whether the heart is enlarges; whether fluid is building up in the lungs; and whether pacemaker / defibrillator leads are still in place.  Labs today We will only contact you if something comes back abnormal or we need to make some changes. Otherwise no news is good news!  Your physician recommends that you schedule a follow-up appointment in: 6 weeks with Dr Aundra Dubin

## 2015-09-29 NOTE — Progress Notes (Signed)
Advanced Heart Failure Clinic Note   PCP: Dr. Woody Seller Cardiology: Dr. Domenic Polite HF Cardiology: Dr. Aundra Dubin  70 yo with history of chronic diastolic CHF, CAD s/p CABG, chronic right pleural effusion, CKD stage III, and PAD presents for CHF clinic evaluation.  She has had multiple admissions with diastolic CHF complicated by CKD.    Admitted from clinic 09/14/15 with 15 lb weight gain over a week period. [place] 09/16/15 with elevated filling pressures and primarily pulmonary venous HTN with PVR 3 WU. Cardiac output stable. She was diuresed 8.1 L and down 22 lbs. Admission complicated by oxygen desats into 70s requiring 02 up to 5 L.[place] catheter removed during admission as it had stopped draining for several weeks. (had been previously placed for R pleural effusion) Discharge weight 189 lbs.   Echo 7/17 showed EF 55-60% with grade II diastolic dysfunction and D-shaped interventricular septum.   She presents today for post hospital follow up.   Weight stable from discharge.  Feels like she's doing good since discharge. Weight at home 187 - 189. Have decreased her salt intake by a lot.  Denies any lightheadedness or dizziness. Denies any further abdominal distention. Remains on 02 at 2 L constantly. Still snores during the night. Orthopnea resolved. Still has minor bedopnea.   RHC 09/16/15  Hemodynamics (mmHg) RA mean 15 RV 76/18 PA 73/26, mean 42 PCWP mean 25 Oxygen saturations: PA 56% AO 93% Cardiac Output (Fick) 5.68  Cardiac Index (Fick) 2.84 PVR 3.0 WU  ECG (7/17): sinus brady, possible prior anterior MI.   Labs (7/17): K 4.2, creatinine 2.04 Labs (09/21/15): K 4.0, creatinine 1.75  PMH: 1. Chronic diastolic CHF: Multiple admissions.  - Echo (7/17) with EF 55-60%, grade II diastolic dysfunction, D-shaped interventricular septum, mild MR, PASP 52 mmHg.  - Uses home oxygen.  2. H/o CVA 3. Recurrent right pleural effusion: S/p Pleurx catheter placement.  4. Depression 5. HTN 6. Carotid  stenosis: Bilateral CEAs 7. Type II diabetes.  8. CAD: NSTEMI 11/12, had CABG with LIMA-LAD, SVG-PLV, SVG-OM, SVG-D.   - LHC (9/15) with DES to SVG-D.  Other grafts patent.  9. PAD: Left femoral-popliteal bypass 9/15.  - peripheral arterial dopplers (2/17) with right ABI 0.53, left ABI 1.02.  10. CKD stage III. Lisinopril stopped with increased creatinine.  11. Bradycardia: Stopped Coreg.   SH: Lives in [place] with son.  Prior smoker, quit 2012.  Retired.   Family History  Problem Relation Age of Onset  . Coronary artery disease Father     Died with MI age 10  . Heart disease Father     before age 41  . Heart attack Father   . Diabetes type II Mother   . Hypertension Mother   . Diabetes Mother   . Heart disease Mother   . Hyperlipidemia Mother   . Heart failure Sister   . Cancer Sister   . Cancer Brother     Lung cancer  . Diabetes Daughter   . Hyperlipidemia Daughter   . Diabetes Son   . Hyperlipidemia Son    ROS: All systems reviewed and negative except as per HPI.   Current Outpatient Prescriptions on File Prior to Encounter  Medication Sig Dispense Refill  . acetaminophen (TYLENOL) 500 MG tablet Take 500-1,000 mg by mouth every 6 (six) hours as needed for mild pain or moderate pain.     [name] albuterol (PROVENTIL HFA;VENTOLIN HFA) 108 (90 BASE) MCG/ACT inhaler Inhale 2 puffs into the lungs daily as needed for  wheezing or shortness of breath.     [name] amLODipine (NORVASC) 10 MG tablet TAKE ONE TABLET BY MOUTH ONCE DAILY (Patient taking differently: Take 10 mg by mouth in the morning) 90 tablet 3  . aspirin 81 MG EC tablet Take 81 mg by mouth daily.    [name] atorvastatin (LIPITOR) 40 MG tablet Take 40 mg by mouth daily at 6 PM.    . citalopram (CELEXA) 40 MG tablet Take 40 mg by mouth every morning.     . clopidogrel (PLAVIX) 75 MG tablet TAKE ONE TABLET BY MOUTH ONCE DAILY WITH BREAKFAST (Patient taking differently: Take 75 mg by mouth in the morning) 30 tablet 6  . Docusate Calcium  (STOOL SOFTENER PO) Take 200 mg by mouth at bedtime.     . gabapentin (NEURONTIN) 300 MG capsule Take 300-600 mg by mouth See admin instructions. 300 mg in the morning and 600 mg prior to bed(time)    . hydrALAZINE (APRESOLINE) 25 MG tablet Take 2 tablets (50 mg total) by mouth 3 (three) times daily. 180 tablet 6  . HYDROcodone-acetaminophen (NORCO/VICODIN) 5-325 MG tablet Take 1 tablet by mouth every 6 (six) hours as needed for moderate pain. 30 tablet 0  . insulin aspart (NOVOLOG) 100 UNIT/ML injection Inject 3 Units into the skin 3 (three) times daily with meals. (Patient taking differently: Inject 3-7 Units into the skin 3 (three) times daily after meals. Per sliding scale) 10 mL 11  . insulin detemir (LEVEMIR) 100 UNIT/ML injection Inject 7 Units into the skin at bedtime.    . isosorbide mononitrate (IMDUR) 30 MG 24 hr tablet TAKE ONE TABLET BY MOUTH ONCE DAILY (Patient taking differently: Take 30 mg by mouth in the evening) 30 tablet 3  . methimazole (TAPAZOLE) 10 MG tablet Take 1 tablet (10 mg total) by mouth daily. 30 tablet 3  . Multiple Vitamin (MULTIVITAMIN WITH MINERALS) TABS tablet Take 1 tablet by mouth daily.    . nitroGLYCERIN (NITROSTAT) 0.4 MG SL tablet Place 1 tablet (0.4 mg total) under the tongue every 5 (five) minutes as needed for chest pain. Up to 3 doses. If no relief after 3rd dose, proceed to the ED for an evaluation 25 tablet 3  . spironolactone (ALDACTONE) 25 MG tablet Take 0.5 tablets (12.5 mg total) by mouth daily. 16 tablet 6  . torsemide (DEMADEX) 20 MG tablet Take 2 tablets (40 mg total) by mouth 2 (two) times daily. 30 tablet 0   No current facility-administered medications on file prior to encounter.      BP (!) 160/54 (BP Location: Left Arm, Patient Position: Sitting, Cuff Size: Normal)   Pulse (!) 58   Ht _0  (1.676 m)   Wt 188 lb (85.3 kg)   SpO2 96%   BMI 30.34 kg/m    Wt Readings from Last 3 Encounters:  09/29/15 188 lb (85.3 kg)  09/21/15 189 lb  14.4 oz (86.1 kg)  09/14/15 212 lb 12 oz (96.5 kg)    General: NAD Neck: JVP 7-8 cm, no thyromegaly or thyroid nodule.  Lungs: Diminished basilar sounds.  CV: Nondisplaced PMI.  Heart regular S1/S2, no S3/S4, 2/6 SEM RUSB. Trace ankle edema at most.  to No carotid bruit. Difficult to palpate pedal pulses.  Abdomen: Soft, NT, ND, no HSM. No bruits or masses. +BS  Skin: Intact without lesions or rashes.  Neurologic: Alert and oriented x 3.  Psych: Normal affect. Extremities: No clubbing.  Right foot more dusky than left foot.  HEENT:  Normal.   Assessment/plan: 1. Chronic diastolic CHF:  - Echo 4/31/42 LVEF 55-60%, Grade 2 DD, PA peak pressure 52 mm Hg - RHC 09/16/15 with elevated filling pressures and primarily pulmonary venous HTN with PVR 3 WU. Cardiac output stable. - Volume status stable. Diuresis is complicated by CKD stage III, suspect cardiorenal syndrome.   - Continue 40 mg torsemide BID. BMET today.  - Continue spironolactone 12.5 mg daily. 2. CKD stage III - BMET today.   3. CAD: s/p CABG.  No chest pain.  H/o PCI to SVG-D in 9/15.  - Continue ASA 81, Plavix, statin.  4. HTN - Increase hydralazine to 75 mg TID - Continue Imdur 30 mg daily.  4. Recurrent right pleural effusion:  - Right Pleurx catheter no longer draining so was removed 09/21/15.   - Needs CXR PA/Lat today to make sure not re-accumulating.  5. Suspect OSA:  - Refer for sleep study.   6. PAD: ABI 0.5 in right foot. - Needs follow up with Dr. Gwenlyn Found.   7. Hyperthyroidism  - New on recent admission. - Now on methimazole 10 mg daily. Will refer to Endo.  8. Chronic pain - Needs to see Dr. Woody Seller  Keep appt with Dr. Domenic Polite.  Follow up with Dr. Aundra Dubin in 6 weeks.  BMET today.  Refer to Endo and for Sleep study as above. Needs follow up with Dr. Woody Seller and Dr. Gwenlyn Found as well  Will send for CXR today for surveillance of her Pleural effusion.  Shirley Friar, PA-C 09/29/2015 2:01 PM  Total time spent >  25 minutes. Over half that spent discussing the above.

## 2015-09-30 DIAGNOSIS — E1122 Type 2 diabetes mellitus with diabetic chronic kidney disease: Secondary | ICD-10-CM | POA: Diagnosis not present

## 2015-09-30 DIAGNOSIS — I5033 Acute on chronic diastolic (congestive) heart failure: Secondary | ICD-10-CM | POA: Diagnosis not present

## 2015-09-30 DIAGNOSIS — E1165 Type 2 diabetes mellitus with hyperglycemia: Secondary | ICD-10-CM | POA: Diagnosis not present

## 2015-09-30 DIAGNOSIS — J9621 Acute and chronic respiratory failure with hypoxia: Secondary | ICD-10-CM | POA: Diagnosis not present

## 2015-09-30 DIAGNOSIS — N183 Chronic kidney disease, stage 3 (moderate): Secondary | ICD-10-CM | POA: Diagnosis not present

## 2015-09-30 DIAGNOSIS — I130 Hypertensive heart and chronic kidney disease with heart failure and stage 1 through stage 4 chronic kidney disease, or unspecified chronic kidney disease: Secondary | ICD-10-CM | POA: Diagnosis not present

## 2015-10-02 DIAGNOSIS — N183 Chronic kidney disease, stage 3 (moderate): Secondary | ICD-10-CM | POA: Diagnosis not present

## 2015-10-02 DIAGNOSIS — I6529 Occlusion and stenosis of unspecified carotid artery: Secondary | ICD-10-CM | POA: Diagnosis not present

## 2015-10-02 DIAGNOSIS — Z7902 Long term (current) use of antithrombotics/antiplatelets: Secondary | ICD-10-CM | POA: Diagnosis not present

## 2015-10-02 DIAGNOSIS — Z87891 Personal history of nicotine dependence: Secondary | ICD-10-CM | POA: Diagnosis not present

## 2015-10-02 DIAGNOSIS — E782 Mixed hyperlipidemia: Secondary | ICD-10-CM | POA: Diagnosis not present

## 2015-10-02 DIAGNOSIS — E1122 Type 2 diabetes mellitus with diabetic chronic kidney disease: Secondary | ICD-10-CM | POA: Diagnosis not present

## 2015-10-02 DIAGNOSIS — Z8673 Personal history of transient ischemic attack (TIA), and cerebral infarction without residual deficits: Secondary | ICD-10-CM | POA: Diagnosis not present

## 2015-10-02 DIAGNOSIS — E1151 Type 2 diabetes mellitus with diabetic peripheral angiopathy without gangrene: Secondary | ICD-10-CM | POA: Diagnosis not present

## 2015-10-02 DIAGNOSIS — E1165 Type 2 diabetes mellitus with hyperglycemia: Secondary | ICD-10-CM | POA: Diagnosis not present

## 2015-10-02 DIAGNOSIS — I130 Hypertensive heart and chronic kidney disease with heart failure and stage 1 through stage 4 chronic kidney disease, or unspecified chronic kidney disease: Secondary | ICD-10-CM | POA: Diagnosis not present

## 2015-10-02 DIAGNOSIS — I739 Peripheral vascular disease, unspecified: Secondary | ICD-10-CM | POA: Diagnosis not present

## 2015-10-02 DIAGNOSIS — Z915 Personal history of self-harm: Secondary | ICD-10-CM | POA: Diagnosis not present

## 2015-10-02 DIAGNOSIS — J90 Pleural effusion, not elsewhere classified: Secondary | ICD-10-CM | POA: Diagnosis not present

## 2015-10-02 DIAGNOSIS — Z794 Long term (current) use of insulin: Secondary | ICD-10-CM | POA: Diagnosis not present

## 2015-10-02 DIAGNOSIS — Z951 Presence of aortocoronary bypass graft: Secondary | ICD-10-CM | POA: Diagnosis not present

## 2015-10-02 DIAGNOSIS — I5033 Acute on chronic diastolic (congestive) heart failure: Secondary | ICD-10-CM | POA: Diagnosis not present

## 2015-10-02 DIAGNOSIS — J9621 Acute and chronic respiratory failure with hypoxia: Secondary | ICD-10-CM | POA: Diagnosis not present

## 2015-10-02 DIAGNOSIS — I2510 Atherosclerotic heart disease of native coronary artery without angina pectoris: Secondary | ICD-10-CM | POA: Diagnosis not present

## 2015-10-02 DIAGNOSIS — Z9981 Dependence on supplemental oxygen: Secondary | ICD-10-CM | POA: Diagnosis not present

## 2015-10-05 DIAGNOSIS — I5033 Acute on chronic diastolic (congestive) heart failure: Secondary | ICD-10-CM | POA: Diagnosis not present

## 2015-10-05 DIAGNOSIS — J90 Pleural effusion, not elsewhere classified: Secondary | ICD-10-CM | POA: Diagnosis not present

## 2015-10-05 DIAGNOSIS — N183 Chronic kidney disease, stage 3 (moderate): Secondary | ICD-10-CM | POA: Diagnosis not present

## 2015-10-05 DIAGNOSIS — E1122 Type 2 diabetes mellitus with diabetic chronic kidney disease: Secondary | ICD-10-CM | POA: Diagnosis not present

## 2015-10-05 DIAGNOSIS — E1165 Type 2 diabetes mellitus with hyperglycemia: Secondary | ICD-10-CM | POA: Diagnosis not present

## 2015-10-05 DIAGNOSIS — I130 Hypertensive heart and chronic kidney disease with heart failure and stage 1 through stage 4 chronic kidney disease, or unspecified chronic kidney disease: Secondary | ICD-10-CM | POA: Diagnosis not present

## 2015-10-06 DIAGNOSIS — E1122 Type 2 diabetes mellitus with diabetic chronic kidney disease: Secondary | ICD-10-CM | POA: Diagnosis not present

## 2015-10-06 DIAGNOSIS — E1165 Type 2 diabetes mellitus with hyperglycemia: Secondary | ICD-10-CM | POA: Diagnosis not present

## 2015-10-06 DIAGNOSIS — I5033 Acute on chronic diastolic (congestive) heart failure: Secondary | ICD-10-CM | POA: Diagnosis not present

## 2015-10-06 DIAGNOSIS — J90 Pleural effusion, not elsewhere classified: Secondary | ICD-10-CM | POA: Diagnosis not present

## 2015-10-06 DIAGNOSIS — I130 Hypertensive heart and chronic kidney disease with heart failure and stage 1 through stage 4 chronic kidney disease, or unspecified chronic kidney disease: Secondary | ICD-10-CM | POA: Diagnosis not present

## 2015-10-06 DIAGNOSIS — N183 Chronic kidney disease, stage 3 (moderate): Secondary | ICD-10-CM | POA: Diagnosis not present

## 2015-10-07 DIAGNOSIS — E1165 Type 2 diabetes mellitus with hyperglycemia: Secondary | ICD-10-CM | POA: Diagnosis not present

## 2015-10-07 DIAGNOSIS — N183 Chronic kidney disease, stage 3 (moderate): Secondary | ICD-10-CM | POA: Diagnosis not present

## 2015-10-07 DIAGNOSIS — E1122 Type 2 diabetes mellitus with diabetic chronic kidney disease: Secondary | ICD-10-CM | POA: Diagnosis not present

## 2015-10-07 DIAGNOSIS — I130 Hypertensive heart and chronic kidney disease with heart failure and stage 1 through stage 4 chronic kidney disease, or unspecified chronic kidney disease: Secondary | ICD-10-CM | POA: Diagnosis not present

## 2015-10-07 DIAGNOSIS — J90 Pleural effusion, not elsewhere classified: Secondary | ICD-10-CM | POA: Diagnosis not present

## 2015-10-07 DIAGNOSIS — I5033 Acute on chronic diastolic (congestive) heart failure: Secondary | ICD-10-CM | POA: Diagnosis not present

## 2015-10-08 DIAGNOSIS — I130 Hypertensive heart and chronic kidney disease with heart failure and stage 1 through stage 4 chronic kidney disease, or unspecified chronic kidney disease: Secondary | ICD-10-CM | POA: Diagnosis not present

## 2015-10-08 DIAGNOSIS — E1122 Type 2 diabetes mellitus with diabetic chronic kidney disease: Secondary | ICD-10-CM | POA: Diagnosis not present

## 2015-10-08 DIAGNOSIS — I5033 Acute on chronic diastolic (congestive) heart failure: Secondary | ICD-10-CM | POA: Diagnosis not present

## 2015-10-08 DIAGNOSIS — E1165 Type 2 diabetes mellitus with hyperglycemia: Secondary | ICD-10-CM | POA: Diagnosis not present

## 2015-10-08 DIAGNOSIS — J90 Pleural effusion, not elsewhere classified: Secondary | ICD-10-CM | POA: Diagnosis not present

## 2015-10-08 DIAGNOSIS — N183 Chronic kidney disease, stage 3 (moderate): Secondary | ICD-10-CM | POA: Diagnosis not present

## 2015-10-11 DIAGNOSIS — J90 Pleural effusion, not elsewhere classified: Secondary | ICD-10-CM | POA: Diagnosis not present

## 2015-10-11 DIAGNOSIS — N183 Chronic kidney disease, stage 3 (moderate): Secondary | ICD-10-CM | POA: Diagnosis not present

## 2015-10-11 DIAGNOSIS — I130 Hypertensive heart and chronic kidney disease with heart failure and stage 1 through stage 4 chronic kidney disease, or unspecified chronic kidney disease: Secondary | ICD-10-CM | POA: Diagnosis not present

## 2015-10-11 DIAGNOSIS — E1165 Type 2 diabetes mellitus with hyperglycemia: Secondary | ICD-10-CM | POA: Diagnosis not present

## 2015-10-11 DIAGNOSIS — E1122 Type 2 diabetes mellitus with diabetic chronic kidney disease: Secondary | ICD-10-CM | POA: Diagnosis not present

## 2015-10-11 DIAGNOSIS — I5033 Acute on chronic diastolic (congestive) heart failure: Secondary | ICD-10-CM | POA: Diagnosis not present

## 2015-10-12 DIAGNOSIS — J90 Pleural effusion, not elsewhere classified: Secondary | ICD-10-CM | POA: Diagnosis not present

## 2015-10-12 DIAGNOSIS — N183 Chronic kidney disease, stage 3 (moderate): Secondary | ICD-10-CM | POA: Diagnosis not present

## 2015-10-12 DIAGNOSIS — E1165 Type 2 diabetes mellitus with hyperglycemia: Secondary | ICD-10-CM | POA: Diagnosis not present

## 2015-10-12 DIAGNOSIS — E1122 Type 2 diabetes mellitus with diabetic chronic kidney disease: Secondary | ICD-10-CM | POA: Diagnosis not present

## 2015-10-12 DIAGNOSIS — I5033 Acute on chronic diastolic (congestive) heart failure: Secondary | ICD-10-CM | POA: Diagnosis not present

## 2015-10-12 DIAGNOSIS — I130 Hypertensive heart and chronic kidney disease with heart failure and stage 1 through stage 4 chronic kidney disease, or unspecified chronic kidney disease: Secondary | ICD-10-CM | POA: Diagnosis not present

## 2015-10-13 DIAGNOSIS — J90 Pleural effusion, not elsewhere classified: Secondary | ICD-10-CM | POA: Diagnosis not present

## 2015-10-13 DIAGNOSIS — E1122 Type 2 diabetes mellitus with diabetic chronic kidney disease: Secondary | ICD-10-CM | POA: Diagnosis not present

## 2015-10-13 DIAGNOSIS — N183 Chronic kidney disease, stage 3 (moderate): Secondary | ICD-10-CM | POA: Diagnosis not present

## 2015-10-13 DIAGNOSIS — E1165 Type 2 diabetes mellitus with hyperglycemia: Secondary | ICD-10-CM | POA: Diagnosis not present

## 2015-10-13 DIAGNOSIS — I130 Hypertensive heart and chronic kidney disease with heart failure and stage 1 through stage 4 chronic kidney disease, or unspecified chronic kidney disease: Secondary | ICD-10-CM | POA: Diagnosis not present

## 2015-10-13 DIAGNOSIS — I5033 Acute on chronic diastolic (congestive) heart failure: Secondary | ICD-10-CM | POA: Diagnosis not present

## 2015-10-15 ENCOUNTER — Ambulatory Visit (INDEPENDENT_AMBULATORY_CARE_PROVIDER_SITE_OTHER): Payer: Medicare Other | Admitting: Cardiology

## 2015-10-15 ENCOUNTER — Encounter: Payer: Self-pay | Admitting: Cardiology

## 2015-10-15 VITALS — BP 121/50 | HR 64 | Ht 66.0 in | Wt 191.0 lb

## 2015-10-15 DIAGNOSIS — E1122 Type 2 diabetes mellitus with diabetic chronic kidney disease: Secondary | ICD-10-CM | POA: Diagnosis not present

## 2015-10-15 DIAGNOSIS — N183 Chronic kidney disease, stage 3 unspecified: Secondary | ICD-10-CM

## 2015-10-15 DIAGNOSIS — I2510 Atherosclerotic heart disease of native coronary artery without angina pectoris: Secondary | ICD-10-CM

## 2015-10-15 DIAGNOSIS — I5032 Chronic diastolic (congestive) heart failure: Secondary | ICD-10-CM

## 2015-10-15 DIAGNOSIS — I5033 Acute on chronic diastolic (congestive) heart failure: Secondary | ICD-10-CM | POA: Diagnosis not present

## 2015-10-15 DIAGNOSIS — J90 Pleural effusion, not elsewhere classified: Secondary | ICD-10-CM

## 2015-10-15 DIAGNOSIS — E1165 Type 2 diabetes mellitus with hyperglycemia: Secondary | ICD-10-CM | POA: Diagnosis not present

## 2015-10-15 DIAGNOSIS — I130 Hypertensive heart and chronic kidney disease with heart failure and stage 1 through stage 4 chronic kidney disease, or unspecified chronic kidney disease: Secondary | ICD-10-CM | POA: Diagnosis not present

## 2015-10-15 NOTE — Patient Instructions (Signed)
Your physician recommends that you schedule a follow-up appointment in: 2 months Dr. Domenic Polite       Your physician recommends that you continue on your current medications as directed. Please refer to the Current Medication list given to you today.     Thank you for choosing [facility] !

## 2015-10-15 NOTE — Progress Notes (Signed)
Cardiology Office Note  Date: 10/15/2015   ID: Keaisha Sublette Zenk, DOB [birth year], MRN [id number]  PCP: Glenda Chroman, MD  Primary Cardiologist: Rozann Lesches, MD   Chief Complaint  Patient presents with  . Diastolic heart failure  . Coronary Artery Disease    History of Present Illness: [name] [name] is a 70 y.o. female last seen in July. Interval records reviewed, she is now being followed in the advanced heart failure clinic. She has elevated right heart pressures and significant pulmonary hypertension with chronic diastolic heart failure. Also recurring right pleural effusion although Pleurx catheter removed as it was not functioning in September. Chest x-ray on September 20 showed a small right pleural effusion.  She is here with her daughter today, overall doing fairly well. They report very stable weights by home checks daily, no fluctuation by more than 1 pound. She has been able to maintain a stable diuretic regimen. We reviewed her medications, she has been on Demadex 40 mg twice daily, also Aldactone 12.5 mg daily. Recent lab work is outlined below, creatinine 1.6 as of September 20.  Still limited in ambulation, uses a rolling walker with portable oxygen. Also has chronic back pain.  She is not reporting any angina symptoms or nitroglycerin use.  Past Medical History:  Diagnosis Date  . Arthritis   . Carotid artery disease (HCC)    a. Bilateral CEA; 50-60% bilateral ICA stenosis, 11/12  . Cellulitis    a. Recurrent, bilateral legs  . CHF (congestive heart failure) ([place])   . Chronic back pain   . Chronic diastolic heart failure ([place])   . Coronary atherosclerosis of native coronary artery    a. 11/2010 NSTEMI/CABG x 4: L-LAD; S-PL; S-OM; S-DX, by PVT.  [name] Critical lower limb ischemia   . Depression   . Essential hypertension, benign   . Herniated disc   . History of blood transfusion   . Ischemic cardiomyopathy    a. 11/2010 TEE: EF 40-45%.  . Migraine   . Mixed  hyperlipidemia   . NSTEMI (non-ST elevated myocardial infarction) ([place]) 11/2010  . On home oxygen therapy   . PAD (peripheral artery disease) ([place])   . Pneumonia 2000's X 2  . Stroke ([place]) 08/2009  . Suicide attempt 08/2002  . TIA (transient ischemic attack)   . TMJ syndrome   . Type 2 diabetes mellitus ([place])   . Venous insufficiency     Past Surgical History:  Procedure Laterality Date  . ABDOMINAL HYSTERECTOMY    . APPENDECTOMY    . CARDIAC CATHETERIZATION  11/2010  . CARDIAC CATHETERIZATION N/A 09/16/2015   Procedure: Right Heart Cath;  Surgeon: Larey Dresser, MD;  Location: [facility];  Service: Cardiovascular;  Laterality: N/A;  . CAROTID ENDARTERECTOMY Bilateral 2011   Bilateral - Dr. Kellie Simmering  . CHOLECYSTECTOMY    . COLONOSCOPY W/ BIOPSIES AND POLYPECTOMY    . CORONARY ANGIOPLASTY WITH STENT PLACEMENT  03/2013   "1"  . CORONARY ARTERY BYPASS GRAFT  11/24/2010   Procedure: CORONARY ARTERY BYPASS GRAFTING (CABG);  Surgeon: Tharon Aquas Adelene Idler, MD;  Location: [facility];  Service: Open Heart Surgery;  Laterality: N/A;  Coronary Artery Bypass Graft times four on pump utilizing left internal mammary artery and bilateral greater saphenous veins harvested endoscopically, transesophageal echocardiogram   . DEBRIDEMENT TOE Left    "nonhealing wound; 3rd digit  . DILATION AND CURETTAGE OF UTERUS    . ENDARTERECTOMY POPLITEAL Left 10/07/2013  Procedure: LEFT POPLITEAL ENDARTERECTOMY ;  Surgeon: Angelia Mould, MD;  Location: [facility];  Service: Vascular;  Laterality: Left;  . FEMORAL-POPLITEAL BYPASS GRAFT Left 10/07/2013   Procedure: LEFT FEMORAL-POPLITEAL ARTERY BYPASS GRAFT;  Surgeon: Angelia Mould, MD;  Location: [facility];  Service: Vascular;  Laterality: Left;  . FRACTURE SURGERY    . INTRAOPERATIVE ARTERIOGRAM Left 10/07/2013   Procedure: INTRA OPERATIVE ARTERIOGRAM LEFT LEG;  Surgeon: Angelia Mould, MD;  Location: [facility];  Service: Vascular;  Laterality: Left;  .  IR GENERIC HISTORICAL  09/21/2015   IR REMOVAL OF PLURAL CATH W/CUFF 09/21/2015 Marybelle Killings, MD [facility]  . LEFT AND RIGHT HEART CATHETERIZATION WITH CORONARY/GRAFT ANGIOGRAM N/A 03/27/2013   Procedure: LEFT AND RIGHT HEART CATHETERIZATION WITH Beatrix Fetters;  Surgeon: Burnell Blanks, MD;  Location: [facility] C[facility];  Service: Cardiovascular;  Laterality: N/A;  . LOWER EXTREMITY ANGIOGRAM  09/08/2012; 10/06/2013   "found 100% blockage; unsuccessful attempt at crossing a chronic total occlusion of the left SFA in the setting of critical limb ischemia  . LOWER EXTREMITY ANGIOGRAM Bilateral 09/08/2013   Procedure: LOWER EXTREMITY ANGIOGRAM;  Surgeon: [name], MD;  Location: [facility] CATH LAB;  Service: Cardiovascular;  Laterality: Bilateral;  . TOE SURGERY Left    "put pin in 2nd toe"  . TUBAL LIGATION    . WRIST FRACTURE SURGERY Left    "grafted bone from hip to wrist"    Current Outpatient Prescriptions  Medication Sig Dispense Refill  . acetaminophen (TYLENOL) 500 MG tablet Take 500-1,000 mg by mouth every 6 (six) hours as needed for mild pain or moderate pain.     [name] albuterol (PROVENTIL HFA;VENTOLIN HFA) 108 (90 BASE) MCG/ACT inhaler Inhale 2 puffs into the lungs daily as needed for wheezing or shortness of breath.     [name] amLODipine (NORVASC) 10 MG tablet TAKE ONE TABLET BY MOUTH ONCE DAILY (Patient taking differently: Take 10 mg by mouth in the morning) 90 tablet 3  . aspirin 81 MG EC tablet Take 81 mg by mouth daily.    [name] atorvastatin (LIPITOR) 40 MG tablet Take 40 mg by mouth daily at 6 PM.    . citalopram (CELEXA) 40 MG tablet Take 40 mg by mouth every morning.     . clopidogrel (PLAVIX) 75 MG tablet TAKE ONE TABLET BY MOUTH ONCE DAILY WITH BREAKFAST (Patient taking differently: Take 75 mg by mouth in the morning) 30 tablet 6  . Docusate Calcium (STOOL SOFTENER PO) Take 200 mg by mouth at bedtime.     . gabapentin (NEURONTIN) 300 MG capsule Take 300-600 mg by mouth See  admin instructions. 300 mg in the morning and 600 mg prior to bed(time)    . hydrALAZINE (APRESOLINE) 50 MG tablet Take 1.5 tablets (75 mg total) by mouth 3 (three) times daily. 100 tablet 3  . HYDROcodone-acetaminophen (NORCO/VICODIN) 5-325 MG tablet Take 1 tablet by mouth every 6 (six) hours as needed for moderate pain. 30 tablet 0  . insulin aspart (NOVOLOG) 100 UNIT/ML injection Inject 3 Units into the skin 3 (three) times daily with meals. (Patient taking differently: Inject 3-7 Units into the skin 3 (three) times daily after meals. Per sliding scale) 10 mL 11  . insulin detemir (LEVEMIR) 100 UNIT/ML injection Inject 7 Units into the skin at bedtime.    . isosorbide mononitrate (IMDUR) 30 MG 24 hr tablet TAKE ONE TABLET BY MOUTH ONCE DAILY (Patient taking differently: Take 30 mg by mouth in the evening)  30 tablet 3  . methimazole (TAPAZOLE) 10 MG tablet Take 1 tablet (10 mg total) by mouth daily. 30 tablet 3  . Multiple Vitamin (MULTIVITAMIN WITH MINERALS) TABS tablet Take 1 tablet by mouth daily.    . nitroGLYCERIN (NITROSTAT) 0.4 MG SL tablet Place 1 tablet (0.4 mg total) under the tongue every 5 (five) minutes as needed for chest pain. Up to 3 doses. If no relief after 3rd dose, proceed to the ED for an evaluation 25 tablet 3  . spironolactone (ALDACTONE) 25 MG tablet Take 0.5 tablets (12.5 mg total) by mouth daily. 16 tablet 6  . torsemide (DEMADEX) 20 MG tablet Take 2 tablets (40 mg total) by mouth 2 (two) times daily. 30 tablet 0  . umeclidinium-vilanterol (ANORO ELLIPTA) 62.5-25 MCG/INH AEPB Inhale 1 puff into the lungs daily.     No current facility-administered medications for this visit.    Allergies:  Other; Strawberry extract; Sulfa antibiotics; Tape; and Coffee bean extract [coffea arabica]   Social History: The patient  reports that she quit smoking about 6 years ago. Her smoking use included Cigarettes. She started smoking about 59 years ago. She has a 150.00 pack-year smoking  history. She has never used smokeless tobacco. She reports that she drinks alcohol. She reports that she uses drugs, including Marijuana.   ROS:  Please see the history of present illness. Otherwise, complete review of systems is positive for chronic dyspnea on exertion, no palpitations or syncope.  All other systems are reviewed and negative.   Physical Exam: VS:  BP (!) 121/50   Pulse 64   Ht 5' 6" (1.676 m)   Wt 191 lb (86.6 kg)   SpO2 91%   BMI 30.83 kg/m , BMI Body mass index is 30.83 kg/m.  Wt Readings from Last 3 Encounters:  10/15/15 191 lb (86.6 kg)  09/29/15 188 lb (85.3 kg)  09/21/15 189 lb 14.4 oz (86.1 kg)    Chronically ill-appearing woman, no distress. Wearing oxygen. HEENT: Conjunctiva and lids normal, oropharynx clear. Neck: Supple, no elevated JVP, bilateral CEA scars, no thyromegaly. Lungs: Scattered basilar rhonchi and decreased breath sounds at the very bases, nonlabored breathing at rest. Cardiac:Regular rate and rhythm, no S3, soft systolic murmur, no pericardial rub. Abdomen: Bowel sounds present, no guarding or rebound. Extremities: Trace ankle edema and venous stasis, distal pulses 1+. Skin: Warm and dry. Musculoskeletal: No kyphosis. Neuropsychiatric: Alert and oriented 3, affect appropriate.  ECG: I personally reviewed the tracing from 07/23/2015 which showed sinus bradycardia with poor R-wave progression.  Recent Labwork: 09/14/2015: ALT 18; AST 20 09/15/2015: TSH 0.103 09/18/2015: Hemoglobin 9.3; Platelets 226 09/20/2015: B Natriuretic Peptide 200.6; Magnesium 3.0 09/29/2015: BUN 78; Creatinine, Ser 1.62; Potassium 3.9; Sodium 134     Component Value Date/Time   CHOL 114 10/13/2010 0401   TRIG 96 10/13/2010 0401   HDL 31 (L) 10/13/2010 0401   CHOLHDL 3.7 10/13/2010 0401   VLDL 19 10/13/2010 0401   LDLCALC 64 10/13/2010 0401    Other Studies Reviewed Today:  Echocardiogram 07/24/2015: Study Conclusions  - Left ventricle: The cavity size was  normal. There was moderate   concentric hypertrophy. Systolic function was normal. The   estimated ejection fraction was in the range of 55% to 60%. Wall   motion was normal; there were no regional wall motion   abnormalities. Features are consistent with a pseudonormal left   ventricular filling pattern, with concomitant abnormal relaxation   and increased filling pressure (grade 2 diastolic dysfunction).  Doppler parameters are consistent with elevated ventricular   end-diastolic filling pressure. - Ventricular septum: The contour showed diastolic flattening and   systolic flattening. - Aortic valve: Trileaflet; normal thickness leaflets. There was   trivial regurgitation. - Aortic root: The aortic root was normal in size. - Ascending aorta: The ascending aorta was normal in size. - Mitral valve: Structurally normal valve. There was mild   regurgitation. - Left atrium: The atrium was moderately dilated. - Right ventricle: Systolic function was normal. - Right atrium: The atrium was normal in size. - Tricuspid valve: There was mild regurgitation. - Pulmonic valve: There was trivial regurgitation. - Pulmonary arteries: Systolic pressure was moderately increased.   PA peak pressure: 52 mm Hg (S). - Inferior vena cava: The vessel was normal in size. - Pericardium, extracardiac: There was no pericardial effusion.  Right heart catheterization 09/16/2015: Right Heart Pressures [facility] Procedural Findings: Hemodynamics (mmHg) RA mean 15 RV 76/18 PA 73/26, mean 42 PCWP mean 25  Oxygen saturations: PA 56% AO 93%  Cardiac Output (Fick) 5.68  Cardiac Index (Fick) 2.84 PVR 3.0 WU   1. Elevated right and left heart filling pressures.  2. Primarily pulmonary venous hypertension with PVR 3 WU.   Chest x-ray 09/29/2015: FINDINGS: Prior CABG. Cardiomegaly. Mild pulmonary venous congestion. Lung volumes. Mild right lower lobe infiltrate. Small right pleural effusion. No pneumothorax  .  IMPRESSION: 1. Prior CABG.  Cardiomegaly.  Mild pulmonary venous congestion.  2. Low lung volumes. Mild right lower lobe infiltrate with small right pleural effusion.  Assessment and Plan:  1. Chronic diastolic heart failure, located by increased right heart pressures and pulmonary hypertension, recent right heart catheterization noted above. She is doing reasonably well at this time with stable weight on present diuretic therapy including Demadex and Aldactone. Degree of renal dysfunction has been stable. I did not make any specific changes today, she has follow-up in the heart failure clinic next month.  2. CKD stage 3, creatinine recently 1.6.  3. History of recurrent right pleural effusion, Pleurx catheter removed in September. She has had a residual small right-sided pleural effusion.  4. Suspected OSA with sleep study pending.  5. CAD status post CABG, no active angina symptoms. He continues on medical therapy.  Current medicines were reviewed with the patient today.  Disposition: Follow-up with me in 2 months.  Signed, Satira Sark, MD, [facility] 10/15/2015 1:50 PM    [facility] at [facility] [address] [place], [place] [address] Phone: [phone]; Fax: [phone]

## 2015-10-18 ENCOUNTER — Telehealth: Payer: Self-pay | Admitting: *Deleted

## 2015-10-18 DIAGNOSIS — J90 Pleural effusion, not elsewhere classified: Secondary | ICD-10-CM | POA: Diagnosis not present

## 2015-10-18 DIAGNOSIS — E1122 Type 2 diabetes mellitus with diabetic chronic kidney disease: Secondary | ICD-10-CM | POA: Diagnosis not present

## 2015-10-18 DIAGNOSIS — I5033 Acute on chronic diastolic (congestive) heart failure: Secondary | ICD-10-CM | POA: Diagnosis not present

## 2015-10-18 DIAGNOSIS — E1165 Type 2 diabetes mellitus with hyperglycemia: Secondary | ICD-10-CM | POA: Diagnosis not present

## 2015-10-18 DIAGNOSIS — I130 Hypertensive heart and chronic kidney disease with heart failure and stage 1 through stage 4 chronic kidney disease, or unspecified chronic kidney disease: Secondary | ICD-10-CM | POA: Diagnosis not present

## 2015-10-18 DIAGNOSIS — Z299 Encounter for prophylactic measures, unspecified: Secondary | ICD-10-CM | POA: Diagnosis not present

## 2015-10-18 DIAGNOSIS — N183 Chronic kidney disease, stage 3 (moderate): Secondary | ICD-10-CM | POA: Diagnosis not present

## 2015-10-18 DIAGNOSIS — M545 Low back pain: Secondary | ICD-10-CM | POA: Diagnosis not present

## 2015-10-18 DIAGNOSIS — K3184 Gastroparesis: Secondary | ICD-10-CM | POA: Diagnosis not present

## 2015-10-18 DIAGNOSIS — E1143 Type 2 diabetes mellitus with diabetic autonomic (poly)neuropathy: Secondary | ICD-10-CM | POA: Diagnosis not present

## 2015-10-18 NOTE — Telephone Encounter (Signed)
[name] [name] from [facility] touching base with Dr. Domenic Polite regarding pt lab results - scanned in chart - was concerned about pt BUN at 71 and with pt on 40 mg bid of torsemide. Will forward to provider

## 2015-10-18 NOTE — Telephone Encounter (Signed)
LM on [facility] w/[facility]

## 2015-10-18 NOTE — Telephone Encounter (Signed)
This is actually stable for her BUN, and importantly her creatinine is looking better. Would not change diuretics for now. Next visit is with the CHF clinic.

## 2015-10-19 ENCOUNTER — Ambulatory Visit (INDEPENDENT_AMBULATORY_CARE_PROVIDER_SITE_OTHER): Payer: Medicare Other | Admitting: Cardiovascular Disease

## 2015-10-19 ENCOUNTER — Other Ambulatory Visit: Payer: Self-pay | Admitting: *Deleted

## 2015-10-19 ENCOUNTER — Encounter: Payer: Self-pay | Admitting: Cardiovascular Disease

## 2015-10-19 VITALS — BP 138/50 | HR 55 | Ht 66.0 in | Wt 192.0 lb

## 2015-10-19 DIAGNOSIS — I739 Peripheral vascular disease, unspecified: Secondary | ICD-10-CM | POA: Diagnosis not present

## 2015-10-19 DIAGNOSIS — Z01818 Encounter for other preprocedural examination: Secondary | ICD-10-CM | POA: Diagnosis not present

## 2015-10-19 DIAGNOSIS — Z7902 Long term (current) use of antithrombotics/antiplatelets: Secondary | ICD-10-CM

## 2015-10-19 DIAGNOSIS — I10 Essential (primary) hypertension: Secondary | ICD-10-CM

## 2015-10-19 DIAGNOSIS — I2510 Atherosclerotic heart disease of native coronary artery without angina pectoris: Secondary | ICD-10-CM

## 2015-10-19 NOTE — Progress Notes (Signed)
10/19/2015 [name] [name]   27-May-1945  [id number]  Primary Physician Glenda Chroman, MD Primary Cardiologist: [name] MD [name] [name]  HPI:   [name] is a 70 year old moderately overweight Caucasian female mother of 4 children, grandmother of the grandchildren referred by Dr. Ladona Horns for critical limb ischemia. Her primary care physician is Dr. Woody Seller. I last saw her in the office 10/31/13. Her cardiovascular risk factor profile is remarkable for 100-150-pack-years of tobacco abuse having quit 3 years ago prior to her bypass graft surgery. She has treated hypertension, diabetes and hyperlipidemia. Her father died of a myocardial infarction. She has had a gait MRI as well as a stroke. She a priori bypass grafting x4 in 2004 by Dr. Tharon Aquas Trigt She's also had bilateral carotid endarterectomies. She has had a nonhealing ulcer on the plantar surface of her left foot for 6 months. Lower extremity arterial Doppler studies performed in the office 08/27/13 revealed a left ABI of 0.62 with occluded left SFA. I intubated her on 09/08/13 revealing an occluded SFA the origin with reconstitution hunters canal two-vessel runoff the ulcer on the bottom of her foot has progressed and she has a new ulcer on the lateral aspect of her left calf. Based on this I decided to proceed with attempt at percutaneous vascularization. On 10/06/13 I was unsuccessful at percutaneously revascularizing her left SFA for critical limb ischemia and she'll underwent left femoral to below knee bypass grafting with a PTFE 6 mm graft. Her surgical wounds have healed. She is a small punctate ulcer on the plantar aspect of her left foot. Since I saw her 2 years ago she's had recurrent admissions for diastolic heart failure and pulmonary hypertension. She does complain of right calf claudication which is lifestyle limiting and progressive. She wishes to pursue percutaneous revascularization.  Current Outpatient Prescriptions    Medication Sig Dispense Refill  . acetaminophen (TYLENOL) 500 MG tablet Take 500-1,000 mg by mouth every 6 (six) hours as needed for mild pain or moderate pain.     [name] albuterol (PROVENTIL HFA;VENTOLIN HFA) 108 (90 BASE) MCG/ACT inhaler Inhale 2 puffs into the lungs daily as needed for wheezing or shortness of breath.     [name] amLODipine (NORVASC) 10 MG tablet TAKE ONE TABLET BY MOUTH ONCE DAILY (Patient taking differently: Take 10 mg by mouth in the morning) 90 tablet 3  . aspirin 81 MG EC tablet Take 81 mg by mouth daily.    [name] atorvastatin (LIPITOR) 40 MG tablet Take 40 mg by mouth daily at 6 PM.    . citalopram (CELEXA) 40 MG tablet Take 40 mg by mouth every morning.     . clopidogrel (PLAVIX) 75 MG tablet TAKE ONE TABLET BY MOUTH ONCE DAILY WITH BREAKFAST (Patient taking differently: Take 75 mg by mouth in the morning) 30 tablet 6  . Docusate Calcium (STOOL SOFTENER PO) Take 200 mg by mouth at bedtime.     . gabapentin (NEURONTIN) 300 MG capsule Take 300-600 mg by mouth See admin instructions. 300 mg in the morning and 600 mg prior to bed(time)    . hydrALAZINE (APRESOLINE) 50 MG tablet Take 1.5 tablets (75 mg total) by mouth 3 (three) times daily. 100 tablet 3  . HYDROcodone-acetaminophen (NORCO) 7.5-325 MG tablet Take 1 tablet by mouth every 6 (six) hours as needed for moderate pain.    [name] insulin aspart (NOVOLOG) 100 UNIT/ML injection Inject 3 Units into the skin 3 (three) times daily  with meals. (Patient taking differently: Inject 3-7 Units into the skin 3 (three) times daily after meals. Per sliding scale) 10 mL 11  . insulin detemir (LEVEMIR) 100 UNIT/ML injection Inject 7 Units into the skin at bedtime.    . isosorbide mononitrate (IMDUR) 30 MG 24 hr tablet TAKE ONE TABLET BY MOUTH ONCE DAILY (Patient taking differently: Take 30 mg by mouth in the evening) 30 tablet 3  . methimazole (TAPAZOLE) 10 MG tablet Take 1 tablet (10 mg total) by mouth daily. 30 tablet 3  . Multiple Vitamin  (MULTIVITAMIN WITH MINERALS) TABS tablet Take 1 tablet by mouth daily.    . nitroGLYCERIN (NITROSTAT) 0.4 MG SL tablet Place 1 tablet (0.4 mg total) under the tongue every 5 (five) minutes as needed for chest pain. Up to 3 doses. If no relief after 3rd dose, proceed to the ED for an evaluation 25 tablet 3  . spironolactone (ALDACTONE) 25 MG tablet Take 0.5 tablets (12.5 mg total) by mouth daily. 16 tablet 6  . tiZANidine (ZANAFLEX) 4 MG tablet Take 4 mg by mouth every 6 (six) hours as needed for muscle spasms.    [name] torsemide (DEMADEX) 20 MG tablet Take 2 tablets (40 mg total) by mouth 2 (two) times daily. 30 tablet 0  . umeclidinium-vilanterol (ANORO ELLIPTA) 62.5-25 MCG/INH AEPB Inhale 1 puff into the lungs daily.     No current facility-administered medications for this visit.     Allergies  Allergen Reactions  . Other Shortness Of Breath, Rash and Other (See Comments)    CANNOT EAT ANY BERRIES!!  . Strawberry Extract Hives, Shortness Of Breath and Rash  . Sulfa Antibiotics Swelling    Bodily Swelling  . Tape Other (See Comments)    Tears skin.  Please use "paper" tape only.  . Coffee Bean Extract [Coffea Arabica] Itching and Rash    Social History   Social History  . Marital status: Widowed    Spouse name: N/A  . Number of children: N/A  . Years of education: N/A   Occupational History  . Not on file.   Social History Main Topics  . Smoking status: Former Smoker    Packs/day: 3.00    Years: 50.00    Types: Cigarettes    Start date: 01/24/1956    Quit date: 10/09/2009  . Smokeless tobacco: Never Used  . Alcohol use 0.0 oz/week     Comment: 10/06/2013 "might have a drink q 2-3 months"  . Drug use:     Types: Marijuana     Comment: "smoked pot in my teens"  . Sexual activity: Not Currently   Other Topics Concern  . Not on file   Social History Narrative   Lives in [place] by herself.  She does not work.     Review of Systems: General: negative for chills, fever,  night sweats or weight changes.  Cardiovascular: negative for chest pain, dyspnea on exertion, edema, orthopnea, palpitations, paroxysmal nocturnal dyspnea or shortness of breath Dermatological: negative for rash Respiratory: negative for cough or wheezing Urologic: negative for hematuria Abdominal: negative for nausea, vomiting, diarrhea, bright red blood per rectum, melena, or hematemesis Neurologic: negative for visual changes, syncope, or dizziness All other systems reviewed and are otherwise negative except as noted above.    Blood pressure (!) 138/50, pulse (!) 55, height _0  (1.676 m), weight 192 lb (87.1 kg).  General appearance: alert and no distress Neck: no adenopathy, no JVD, supple, symmetrical, trachea midline, thyroid not enlarged,  symmetric, no tenderness/mass/nodules and Bilateral carotid bruits Lungs: clear to auscultation bilaterally Heart: regular rate and rhythm, S1, S2 normal, no murmur, click, rub or gallop Extremities: extremities normal, atraumatic, no cyanosis or edema  EKG sinus bradycardia at 55 with septal Q waves and nonspecific ST and T-wave changes. I personally reviewed this EKG  ASSESSMENT AND PLAN:   PVD (peripheral vascular disease) ([place]) History of peripheral arterial disease status post angiography by myself 09/08/13 revealing an occluded left SFA origin reconstituting in the adductor canal with 2 vessel runoff on the left. She had 90% mid right SFA stenosis with 1 vessel runoff via the peroneal artery. She did have critical limb ischemia on the left. I attempted to recanalize her left SFA percutaneously on 10/06/13 unsuccessfully and she also underwent left him below the knee popliteal artery bypass grafting with a 6 mm PTFE graft by Dr. Scot Dock 10/07/13. Her wound eventually healed. She no longer has claudication on that side. She is being referred back to me because of progressive right lower extremity claudication. She does have moderate renal  insufficiency, heart failure with pulmonary hypertension. I will get lower extremity arterial Doppler studies and arrange for her to undergo limited angiography of the right lower extremity and potential percutaneous revascularization.      [name] MD FACP,FACC,FAHA, [facility] 10/19/2015 12:06 PM

## 2015-10-19 NOTE — Patient Instructions (Addendum)
Medication Instructions:  No changes.  PRIOR TO YOUR PROCEDURE: 1- STOP TAKING YOUR TORSEMIDE AND ALDACTONE FOR 2 DAYS PRIOR TO THE PROCEDURE. 2- TAKE 1/2 YOUR DOSE OF LEVEMIR THE NIGHT BEFORE PROCEDURE. 3- DO NOT TAKE ANY NOVOLOG INSULIN THE MORNING OF THE PROCEDURE.   Testing/Procedures: Your physician has requested that you have a lower extremity arterial doppler- During this test, ultrasound is used to evaluate arterial blood flow in the legs. Allow approximately one hour for this exam.  MUST HAVE PRIOR TO PV ANGIO.   Dr. Gwenlyn Found has ordered a peripheral LOWER EXTREMITY angiogram to be done at [facility].  This procedure is going to look at the bloodflow in your lower extremities.  If Dr. Gwenlyn Found is able to open up the arteries, you will have to spend one night in the hospital.  If he is not able to open the arteries, you will be able to go home that same day.    After the procedure, you will not be allowed to drive for 3 days or push, pull, or lift anything greater than 10 lbs for one week.    You will be required to have the following tests prior to the procedure:  1. Blood work-the blood work can be done no more than 14 days prior to the procedure.  It can be done at any [facility] lab.  There is one downstairs on the first floor of this building and one in the [facility] building [phone] [address])   *REPS  SCOTT  Puncture site LEFT GROIN   If you need a refill on your cardiac medications before your next appointment, please call your pharmacy.

## 2015-10-19 NOTE — Assessment & Plan Note (Signed)
History of peripheral arterial disease status post angiography by myself 09/08/13 revealing an occluded left SFA origin reconstituting in the adductor canal with 2 vessel runoff on the left. She had 90% mid right SFA stenosis with 1 vessel runoff via the peroneal artery. She did have critical limb ischemia on the left. I attempted to recanalize her left SFA percutaneously on 10/06/13 unsuccessfully and she also underwent left him below the knee popliteal artery bypass grafting with a 6 mm PTFE graft by Dr. Scot Dock 10/07/13. Her wound eventually healed. She no longer has claudication on that side. She is being referred back to me because of progressive right lower extremity claudication. She does have moderate renal insufficiency, heart failure with pulmonary hypertension. I will get lower extremity arterial Doppler studies and arrange for her to undergo limited angiography of the right lower extremity and potential percutaneous revascularization.

## 2015-10-20 DIAGNOSIS — I5033 Acute on chronic diastolic (congestive) heart failure: Secondary | ICD-10-CM | POA: Diagnosis not present

## 2015-10-20 DIAGNOSIS — J90 Pleural effusion, not elsewhere classified: Secondary | ICD-10-CM | POA: Diagnosis not present

## 2015-10-20 DIAGNOSIS — N183 Chronic kidney disease, stage 3 (moderate): Secondary | ICD-10-CM | POA: Diagnosis not present

## 2015-10-20 DIAGNOSIS — I130 Hypertensive heart and chronic kidney disease with heart failure and stage 1 through stage 4 chronic kidney disease, or unspecified chronic kidney disease: Secondary | ICD-10-CM | POA: Diagnosis not present

## 2015-10-20 DIAGNOSIS — E1165 Type 2 diabetes mellitus with hyperglycemia: Secondary | ICD-10-CM | POA: Diagnosis not present

## 2015-10-20 DIAGNOSIS — E1122 Type 2 diabetes mellitus with diabetic chronic kidney disease: Secondary | ICD-10-CM | POA: Diagnosis not present

## 2015-10-21 DIAGNOSIS — J90 Pleural effusion, not elsewhere classified: Secondary | ICD-10-CM | POA: Diagnosis not present

## 2015-10-21 DIAGNOSIS — I5033 Acute on chronic diastolic (congestive) heart failure: Secondary | ICD-10-CM | POA: Diagnosis not present

## 2015-10-21 DIAGNOSIS — E1122 Type 2 diabetes mellitus with diabetic chronic kidney disease: Secondary | ICD-10-CM | POA: Diagnosis not present

## 2015-10-21 DIAGNOSIS — N183 Chronic kidney disease, stage 3 (moderate): Secondary | ICD-10-CM | POA: Diagnosis not present

## 2015-10-21 DIAGNOSIS — I130 Hypertensive heart and chronic kidney disease with heart failure and stage 1 through stage 4 chronic kidney disease, or unspecified chronic kidney disease: Secondary | ICD-10-CM | POA: Diagnosis not present

## 2015-10-21 DIAGNOSIS — E1165 Type 2 diabetes mellitus with hyperglycemia: Secondary | ICD-10-CM | POA: Diagnosis not present

## 2015-10-22 ENCOUNTER — Other Ambulatory Visit: Payer: Self-pay | Admitting: Cardiovascular Disease

## 2015-10-22 DIAGNOSIS — J90 Pleural effusion, not elsewhere classified: Secondary | ICD-10-CM | POA: Diagnosis not present

## 2015-10-22 DIAGNOSIS — I5033 Acute on chronic diastolic (congestive) heart failure: Secondary | ICD-10-CM | POA: Diagnosis not present

## 2015-10-22 DIAGNOSIS — E1165 Type 2 diabetes mellitus with hyperglycemia: Secondary | ICD-10-CM | POA: Diagnosis not present

## 2015-10-22 DIAGNOSIS — M47817 Spondylosis without myelopathy or radiculopathy, lumbosacral region: Secondary | ICD-10-CM | POA: Diagnosis not present

## 2015-10-22 DIAGNOSIS — M545 Low back pain: Secondary | ICD-10-CM | POA: Diagnosis not present

## 2015-10-22 DIAGNOSIS — I739 Peripheral vascular disease, unspecified: Secondary | ICD-10-CM

## 2015-10-22 DIAGNOSIS — I700 Atherosclerosis of aorta: Secondary | ICD-10-CM | POA: Diagnosis not present

## 2015-10-22 DIAGNOSIS — N183 Chronic kidney disease, stage 3 (moderate): Secondary | ICD-10-CM | POA: Diagnosis not present

## 2015-10-22 DIAGNOSIS — E1122 Type 2 diabetes mellitus with diabetic chronic kidney disease: Secondary | ICD-10-CM | POA: Diagnosis not present

## 2015-10-22 DIAGNOSIS — I130 Hypertensive heart and chronic kidney disease with heart failure and stage 1 through stage 4 chronic kidney disease, or unspecified chronic kidney disease: Secondary | ICD-10-CM | POA: Diagnosis not present

## 2015-10-22 DIAGNOSIS — M47816 Spondylosis without myelopathy or radiculopathy, lumbar region: Secondary | ICD-10-CM | POA: Diagnosis not present

## 2015-10-26 DIAGNOSIS — E1122 Type 2 diabetes mellitus with diabetic chronic kidney disease: Secondary | ICD-10-CM | POA: Diagnosis not present

## 2015-10-26 DIAGNOSIS — N183 Chronic kidney disease, stage 3 (moderate): Secondary | ICD-10-CM | POA: Diagnosis not present

## 2015-10-26 DIAGNOSIS — I5033 Acute on chronic diastolic (congestive) heart failure: Secondary | ICD-10-CM | POA: Diagnosis not present

## 2015-10-26 DIAGNOSIS — J90 Pleural effusion, not elsewhere classified: Secondary | ICD-10-CM | POA: Diagnosis not present

## 2015-10-26 DIAGNOSIS — I130 Hypertensive heart and chronic kidney disease with heart failure and stage 1 through stage 4 chronic kidney disease, or unspecified chronic kidney disease: Secondary | ICD-10-CM | POA: Diagnosis not present

## 2015-10-26 DIAGNOSIS — E1165 Type 2 diabetes mellitus with hyperglycemia: Secondary | ICD-10-CM | POA: Diagnosis not present

## 2015-10-28 DIAGNOSIS — I130 Hypertensive heart and chronic kidney disease with heart failure and stage 1 through stage 4 chronic kidney disease, or unspecified chronic kidney disease: Secondary | ICD-10-CM | POA: Diagnosis not present

## 2015-10-28 DIAGNOSIS — I5033 Acute on chronic diastolic (congestive) heart failure: Secondary | ICD-10-CM | POA: Diagnosis not present

## 2015-10-28 DIAGNOSIS — J90 Pleural effusion, not elsewhere classified: Secondary | ICD-10-CM | POA: Diagnosis not present

## 2015-10-28 DIAGNOSIS — N183 Chronic kidney disease, stage 3 (moderate): Secondary | ICD-10-CM | POA: Diagnosis not present

## 2015-10-28 DIAGNOSIS — E1122 Type 2 diabetes mellitus with diabetic chronic kidney disease: Secondary | ICD-10-CM | POA: Diagnosis not present

## 2015-10-28 DIAGNOSIS — E1165 Type 2 diabetes mellitus with hyperglycemia: Secondary | ICD-10-CM | POA: Diagnosis not present

## 2015-10-29 DIAGNOSIS — I10 Essential (primary) hypertension: Secondary | ICD-10-CM | POA: Diagnosis not present

## 2015-10-29 DIAGNOSIS — I509 Heart failure, unspecified: Secondary | ICD-10-CM | POA: Diagnosis not present

## 2015-10-29 DIAGNOSIS — E1122 Type 2 diabetes mellitus with diabetic chronic kidney disease: Secondary | ICD-10-CM | POA: Diagnosis not present

## 2015-10-29 DIAGNOSIS — Z299 Encounter for prophylactic measures, unspecified: Secondary | ICD-10-CM | POA: Diagnosis not present

## 2015-10-29 DIAGNOSIS — E1142 Type 2 diabetes mellitus with diabetic polyneuropathy: Secondary | ICD-10-CM | POA: Diagnosis not present

## 2015-11-01 ENCOUNTER — Telehealth: Payer: Self-pay | Admitting: *Deleted

## 2015-11-01 DIAGNOSIS — N181 Chronic kidney disease, stage 1: Secondary | ICD-10-CM | POA: Diagnosis not present

## 2015-11-01 DIAGNOSIS — Z79899 Other long term (current) drug therapy: Secondary | ICD-10-CM | POA: Diagnosis not present

## 2015-11-01 DIAGNOSIS — E1165 Type 2 diabetes mellitus with hyperglycemia: Secondary | ICD-10-CM | POA: Diagnosis not present

## 2015-11-01 DIAGNOSIS — Z5181 Encounter for therapeutic drug level monitoring: Secondary | ICD-10-CM | POA: Diagnosis not present

## 2015-11-01 DIAGNOSIS — I130 Hypertensive heart and chronic kidney disease with heart failure and stage 1 through stage 4 chronic kidney disease, or unspecified chronic kidney disease: Secondary | ICD-10-CM | POA: Diagnosis not present

## 2015-11-01 DIAGNOSIS — E1122 Type 2 diabetes mellitus with diabetic chronic kidney disease: Secondary | ICD-10-CM | POA: Diagnosis not present

## 2015-11-01 DIAGNOSIS — I5033 Acute on chronic diastolic (congestive) heart failure: Secondary | ICD-10-CM | POA: Diagnosis not present

## 2015-11-01 DIAGNOSIS — N183 Chronic kidney disease, stage 3 (moderate): Secondary | ICD-10-CM | POA: Diagnosis not present

## 2015-11-01 DIAGNOSIS — J90 Pleural effusion, not elsewhere classified: Secondary | ICD-10-CM | POA: Diagnosis not present

## 2015-11-01 NOTE — Telephone Encounter (Signed)
Orders for "SN to obtain labs VP on scheduled SNV 11/01/15 for PT/INR, PTT, CBC, BMP and fax report to Dr Gwenlyn Found." Reviewed and signed by Dr Gwenlyn Found. Faxed to number provided and placed to be scanned into chart.

## 2015-11-02 ENCOUNTER — Ambulatory Visit (HOSPITAL_COMMUNITY)
Admission: RE | Admit: 2015-11-02 | Discharge: 2015-11-02 | Disposition: A | Discharge status: Home / Self Care | Payer: Medicare Other | Source: Ambulatory Visit | Attending: Cardiovascular Disease | Admitting: Cardiovascular Disease

## 2015-11-02 DIAGNOSIS — I739 Peripheral vascular disease, unspecified: Secondary | ICD-10-CM | POA: Diagnosis not present

## 2015-11-03 DIAGNOSIS — N183 Chronic kidney disease, stage 3 (moderate): Secondary | ICD-10-CM | POA: Diagnosis not present

## 2015-11-03 DIAGNOSIS — I5033 Acute on chronic diastolic (congestive) heart failure: Secondary | ICD-10-CM | POA: Diagnosis not present

## 2015-11-03 DIAGNOSIS — J90 Pleural effusion, not elsewhere classified: Secondary | ICD-10-CM | POA: Diagnosis not present

## 2015-11-03 DIAGNOSIS — E1165 Type 2 diabetes mellitus with hyperglycemia: Secondary | ICD-10-CM | POA: Diagnosis not present

## 2015-11-03 DIAGNOSIS — E1122 Type 2 diabetes mellitus with diabetic chronic kidney disease: Secondary | ICD-10-CM | POA: Diagnosis not present

## 2015-11-03 DIAGNOSIS — I130 Hypertensive heart and chronic kidney disease with heart failure and stage 1 through stage 4 chronic kidney disease, or unspecified chronic kidney disease: Secondary | ICD-10-CM | POA: Diagnosis not present

## 2015-11-03 NOTE — Progress Notes (Signed)
  Perfect. Is Nicki Reaper going to be there.

## 2015-11-04 ENCOUNTER — Encounter (HOSPITAL_COMMUNITY)
Admission: RE | Disposition: A | Discharge status: Home / Self Care | Payer: Self-pay | Source: Ambulatory Visit | Attending: Cardiovascular Disease

## 2015-11-04 ENCOUNTER — Encounter (HOSPITAL_COMMUNITY): Payer: Self-pay | Admitting: *Deleted

## 2015-11-04 ENCOUNTER — Ambulatory Visit (HOSPITAL_COMMUNITY)
Admission: RE | Admit: 2015-11-04 | Discharge: 2015-11-05 | Disposition: A | Discharge status: Home / Self Care | Payer: Medicare Other | Source: Ambulatory Visit | Attending: Cardiovascular Disease | Admitting: Cardiovascular Disease

## 2015-11-04 DIAGNOSIS — I2510 Atherosclerotic heart disease of native coronary artery without angina pectoris: Secondary | ICD-10-CM | POA: Diagnosis not present

## 2015-11-04 DIAGNOSIS — Z87891 Personal history of nicotine dependence: Secondary | ICD-10-CM | POA: Diagnosis not present

## 2015-11-04 DIAGNOSIS — I272 Pulmonary hypertension, unspecified: Secondary | ICD-10-CM | POA: Diagnosis not present

## 2015-11-04 DIAGNOSIS — Z951 Presence of aortocoronary bypass graft: Secondary | ICD-10-CM | POA: Insufficient documentation

## 2015-11-04 DIAGNOSIS — I110 Hypertensive heart disease with heart failure: Secondary | ICD-10-CM | POA: Insufficient documentation

## 2015-11-04 DIAGNOSIS — I70211 Atherosclerosis of native arteries of extremities with intermittent claudication, right leg: Secondary | ICD-10-CM | POA: Diagnosis not present

## 2015-11-04 DIAGNOSIS — E785 Hyperlipidemia, unspecified: Secondary | ICD-10-CM | POA: Diagnosis not present

## 2015-11-04 DIAGNOSIS — I739 Peripheral vascular disease, unspecified: Secondary | ICD-10-CM | POA: Diagnosis present

## 2015-11-04 DIAGNOSIS — I5032 Chronic diastolic (congestive) heart failure: Secondary | ICD-10-CM | POA: Insufficient documentation

## 2015-11-04 DIAGNOSIS — Z8249 Family history of ischemic heart disease and other diseases of the circulatory system: Secondary | ICD-10-CM | POA: Diagnosis not present

## 2015-11-04 DIAGNOSIS — Z6832 Body mass index (BMI) 32.0-32.9, adult: Secondary | ICD-10-CM | POA: Insufficient documentation

## 2015-11-04 DIAGNOSIS — E663 Overweight: Secondary | ICD-10-CM | POA: Insufficient documentation

## 2015-11-04 DIAGNOSIS — Z7982 Long term (current) use of aspirin: Secondary | ICD-10-CM | POA: Diagnosis not present

## 2015-11-04 DIAGNOSIS — Z86718 Personal history of other venous thrombosis and embolism: Secondary | ICD-10-CM | POA: Insufficient documentation

## 2015-11-04 DIAGNOSIS — Z7902 Long term (current) use of antithrombotics/antiplatelets: Secondary | ICD-10-CM | POA: Insufficient documentation

## 2015-11-04 DIAGNOSIS — E119 Type 2 diabetes mellitus without complications: Secondary | ICD-10-CM | POA: Insufficient documentation

## 2015-11-04 HISTORY — PX: PERIPHERAL VASCULAR CATHETERIZATION: SHX172C

## 2015-11-04 LAB — BASIC METABOLIC PANEL
Anion gap: 9 (ref 5–15)
BUN: 78 mg/dL — AB (ref 6–20)
CALCIUM: 8.9 mg/dL (ref 8.9–10.3)
CHLORIDE: 93 mmol/L — AB (ref 101–111)
CO2: 26 mmol/L (ref 22–32)
CREATININE: 1.89 mg/dL — AB (ref 0.44–1.00)
GFR calc non Af Amer: 26 mL/min — ABNORMAL LOW (ref >60)
GFR, EST AFRICAN AMERICAN: 30 mL/min — AB (ref >60)
Glucose, Bld: 189 mg/dL — ABNORMAL HIGH (ref 65–99)
Potassium: 4.9 mmol/L (ref 3.5–5.1)
Sodium: 128 mmol/L — ABNORMAL LOW (ref 135–145)

## 2015-11-04 LAB — POCT ACTIVATED CLOTTING TIME
ACTIVATED CLOTTING TIME: 213 s
Activated Clotting Time: 191 seconds
Activated Clotting Time: 230 seconds
Activated Clotting Time: 158 seconds

## 2015-11-04 LAB — CBC
HEMATOCRIT: 32.2 % — AB (ref 36.0–46.0)
Hemoglobin: 9.8 g/dL — ABNORMAL LOW (ref 12.0–15.0)
MCH: 26.1 pg (ref 26.0–34.0)
MCHC: 30.4 g/dL (ref 30.0–36.0)
MCV: 85.6 fL (ref 78.0–100.0)
Platelets: 244 10*3/uL (ref 150–400)
RBC: 3.76 MIL/uL — ABNORMAL LOW (ref 3.87–5.11)
RDW: 16.7 % — AB (ref 11.5–15.5)
WBC: 8.9 10*3/uL (ref 4.0–10.5)

## 2015-11-04 LAB — PROTIME-INR
INR: 1.31
Prothrombin Time: 16.4 seconds — ABNORMAL HIGH (ref 11.4–15.2)

## 2015-11-04 LAB — GLUCOSE, CAPILLARY
GLUCOSE-CAPILLARY: 200 mg/dL — AB (ref 65–99)
GLUCOSE-CAPILLARY: 148 mg/dL — AB (ref 65–99)
Glucose-Capillary: 212 mg/dL — ABNORMAL HIGH (ref 65–99)

## 2015-11-04 LAB — MRSA PCR SCREENING: MRSA by PCR: POSITIVE — AB

## 2015-11-04 SURGERY — PERIPHERAL VASCULAR ATHERECTOMY
Laterality: Bilateral

## 2015-11-04 MED ORDER — SODIUM CHLORIDE 0.9 % WEIGHT BASED INFUSION
3.0000 mL/kg/h | INTRAVENOUS | Status: DC
  Administered 2015-11-04: 3 mL/kg/h via INTRAVENOUS

## 2015-11-04 MED ORDER — TORSEMIDE 20 MG PO TABS
40.0000 mg | ORAL_TABLET | Freq: Two times a day (BID) | ORAL | Status: DC
  Administered 2015-11-04 – 2015-11-05 (×2): 40 mg via ORAL
  Filled 2015-11-04 (×2): qty 2

## 2015-11-04 MED ORDER — SODIUM CHLORIDE 0.9 % IV SOLN: INTRAVENOUS | Status: AC

## 2015-11-04 MED ORDER — HYDROCODONE-ACETAMINOPHEN 7.5-325 MG PO TABS
1.0000 | ORAL_TABLET | Freq: Four times a day (QID) | ORAL | Status: DC | PRN
  Administered 2015-11-04 – 2015-11-05 (×2): 1 via ORAL
  Filled 2015-11-04 (×2): qty 1

## 2015-11-04 MED ORDER — ATORVASTATIN CALCIUM 40 MG PO TABS
40.0000 mg | ORAL_TABLET | Freq: Every day | ORAL | Status: DC
  Administered 2015-11-04: 40 mg via ORAL
  Filled 2015-11-04: qty 1

## 2015-11-04 MED ORDER — ASPIRIN EC 81 MG PO TBEC: 81.0000 mg | DELAYED_RELEASE_TABLET | Freq: Every day | ORAL | Status: DC

## 2015-11-04 MED ORDER — ONDANSETRON HCL 4 MG/2ML IJ SOLN: 4.0000 mg | Freq: Four times a day (QID) | INTRAMUSCULAR | Status: DC | PRN

## 2015-11-04 MED ORDER — SODIUM CHLORIDE 0.9% FLUSH: 3.0000 mL | INTRAVENOUS | Status: DC | PRN

## 2015-11-04 MED ORDER — MUPIROCIN 2 % EX OINT
1.0000 "application " | TOPICAL_OINTMENT | Freq: Two times a day (BID) | CUTANEOUS | Status: DC
  Administered 2015-11-04 – 2015-11-05 (×2): 1 via NASAL
  Filled 2015-11-04: qty 22

## 2015-11-04 MED ORDER — CHLORHEXIDINE GLUCONATE CLOTH 2 % EX PADS
6.0000 | MEDICATED_PAD | Freq: Every day | CUTANEOUS | Status: DC
  Administered 2015-11-05: 6 via TOPICAL

## 2015-11-04 MED ORDER — LIDOCAINE HCL (PF) 1 % IJ SOLN
INTRAMUSCULAR | Status: DC | PRN
  Administered 2015-11-04: 20 mL

## 2015-11-04 MED ORDER — GABAPENTIN 300 MG PO CAPS
300.0000 mg | ORAL_CAPSULE | Freq: Every day | ORAL | Status: DC
  Administered 2015-11-04 – 2015-11-05 (×2): 300 mg via ORAL
  Filled 2015-11-04 (×2): qty 1

## 2015-11-04 MED ORDER — CITALOPRAM HYDROBROMIDE 20 MG PO TABS
40.0000 mg | ORAL_TABLET | ORAL | Status: DC
  Administered 2015-11-05: 40 mg via ORAL
  Filled 2015-11-04: qty 2

## 2015-11-04 MED ORDER — LIDOCAINE HCL (PF) 1 % IJ SOLN
INTRAMUSCULAR | Status: AC
  Filled 2015-11-04: qty 30

## 2015-11-04 MED ORDER — HEPARIN SODIUM (PORCINE) 1000 UNIT/ML IJ SOLN
INTRAMUSCULAR | Status: AC
  Filled 2015-11-04: qty 1

## 2015-11-04 MED ORDER — SPIRONOLACTONE 25 MG PO TABS
12.5000 mg | ORAL_TABLET | Freq: Every day | ORAL | Status: DC
  Administered 2015-11-04 – 2015-11-05 (×2): 12.5 mg via ORAL
  Filled 2015-11-04 (×2): qty 1

## 2015-11-04 MED ORDER — HEPARIN (PORCINE) IN NACL 2-0.9 UNIT/ML-% IJ SOLN
INTRAMUSCULAR | Status: DC | PRN
  Administered 2015-11-04: 1000 mL

## 2015-11-04 MED ORDER — GABAPENTIN 600 MG PO TABS: 600.0000 mg | ORAL_TABLET | Freq: Every day | ORAL | Status: DC

## 2015-11-04 MED ORDER — MORPHINE SULFATE (PF) 2 MG/ML IV SOLN: 2.0000 mg | INTRAVENOUS | Status: DC | PRN

## 2015-11-04 MED ORDER — ACETAMINOPHEN 325 MG PO TABS: 650.0000 mg | ORAL_TABLET | ORAL | Status: DC | PRN

## 2015-11-04 MED ORDER — ASPIRIN EC 81 MG PO TBEC
81.0000 mg | DELAYED_RELEASE_TABLET | Freq: Every day | ORAL | Status: DC
  Administered 2015-11-05: 09:00:00 81 mg via ORAL
  Filled 2015-11-04: qty 1

## 2015-11-04 MED ORDER — HYDRALAZINE HCL 20 MG/ML IJ SOLN: 10.0000 mg | INTRAMUSCULAR | Status: DC | PRN

## 2015-11-04 MED ORDER — FENTANYL CITRATE (PF) 100 MCG/2ML IJ SOLN
INTRAMUSCULAR | Status: AC
  Filled 2015-11-04: qty 2

## 2015-11-04 MED ORDER — ANGIOPLASTY BOOK
Freq: Once | Status: AC
  Administered 2015-11-04: 20:00:00
  Filled 2015-11-04: qty 1

## 2015-11-04 MED ORDER — INSULIN DETEMIR 100 UNIT/ML ~~LOC~~ SOLN
7.0000 [IU] | Freq: Every day | SUBCUTANEOUS | Status: DC
  Administered 2015-11-04: 22:00:00 7 [IU] via SUBCUTANEOUS
  Filled 2015-11-04 (×3): qty 0.07

## 2015-11-04 MED ORDER — ISOSORBIDE MONONITRATE ER 30 MG PO TB24
30.0000 mg | ORAL_TABLET | Freq: Every day | ORAL | Status: DC
  Administered 2015-11-05: 30 mg via ORAL
  Filled 2015-11-04: qty 1

## 2015-11-04 MED ORDER — UMECLIDINIUM BROMIDE 62.5 MCG/INH IN AEPB
1.0000 | INHALATION_SPRAY | Freq: Every day | RESPIRATORY_TRACT | Status: DC
  Administered 2015-11-04 – 2015-11-05 (×2): 1 via RESPIRATORY_TRACT
  Filled 2015-11-04: qty 7

## 2015-11-04 MED ORDER — ALBUTEROL SULFATE (2.5 MG/3ML) 0.083% IN NEBU: 3.0000 mL | INHALATION_SOLUTION | Freq: Every day | RESPIRATORY_TRACT | Status: DC | PRN

## 2015-11-04 MED ORDER — CLOPIDOGREL BISULFATE 75 MG PO TABS
75.0000 mg | ORAL_TABLET | Freq: Every day | ORAL | Status: DC
  Administered 2015-11-05: 75 mg via ORAL
  Filled 2015-11-04: qty 1

## 2015-11-04 MED ORDER — METHIMAZOLE 10 MG PO TABS
10.0000 mg | ORAL_TABLET | Freq: Every day | ORAL | Status: DC
  Administered 2015-11-05: 11:00:00 10 mg via ORAL
  Filled 2015-11-04: qty 1

## 2015-11-04 MED ORDER — FENTANYL CITRATE (PF) 100 MCG/2ML IJ SOLN
INTRAMUSCULAR | Status: DC | PRN
  Administered 2015-11-04: 25 ug via INTRAVENOUS

## 2015-11-04 MED ORDER — HYDRALAZINE HCL 50 MG PO TABS
75.0000 mg | ORAL_TABLET | Freq: Three times a day (TID) | ORAL | Status: DC
  Administered 2015-11-04 – 2015-11-05 (×2): 75 mg via ORAL
  Filled 2015-11-04 (×2): qty 1

## 2015-11-04 MED ORDER — ARFORMOTEROL TARTRATE 15 MCG/2ML IN NEBU
15.0000 ug | INHALATION_SOLUTION | Freq: Two times a day (BID) | RESPIRATORY_TRACT | Status: DC
  Administered 2015-11-04 – 2015-11-05 (×2): 15 ug via RESPIRATORY_TRACT
  Filled 2015-11-04 (×2): qty 2

## 2015-11-04 MED ORDER — ASPIRIN 81 MG PO CHEW: 81.0000 mg | CHEWABLE_TABLET | ORAL | Status: DC

## 2015-11-04 MED ORDER — HEPARIN SODIUM (PORCINE) 1000 UNIT/ML IJ SOLN
INTRAMUSCULAR | Status: AC
Start: 2015-11-04 — End: 2015-11-04
  Filled 2015-11-04: qty 1

## 2015-11-04 MED ORDER — ADULT MULTIVITAMIN W/MINERALS CH
1.0000 | ORAL_TABLET | Freq: Every day | ORAL | Status: DC
  Administered 2015-11-05: 1 via ORAL
  Filled 2015-11-04: qty 1

## 2015-11-04 MED ORDER — NITROGLYCERIN 0.4 MG SL SUBL: 0.4000 mg | SUBLINGUAL_TABLET | SUBLINGUAL | Status: DC | PRN

## 2015-11-04 MED ORDER — SODIUM CHLORIDE 0.9 % WEIGHT BASED INFUSION: 1.0000 mL/kg/h | INTRAVENOUS | Status: DC

## 2015-11-04 MED ORDER — ACETAMINOPHEN 500 MG PO TABS: 500.0000 mg | ORAL_TABLET | Freq: Four times a day (QID) | ORAL | Status: DC | PRN

## 2015-11-04 MED ORDER — HEPARIN (PORCINE) IN NACL 2-0.9 UNIT/ML-% IJ SOLN
INTRAMUSCULAR | Status: AC
  Filled 2015-11-04: qty 1000

## 2015-11-04 MED ORDER — AMLODIPINE BESYLATE 10 MG PO TABS
10.0000 mg | ORAL_TABLET | Freq: Every day | ORAL | Status: DC
  Administered 2015-11-05: 10 mg via ORAL
  Filled 2015-11-04: qty 1

## 2015-11-04 MED ORDER — UMECLIDINIUM-VILANTEROL 62.5-25 MCG/INH IN AEPB
1.0000 | INHALATION_SPRAY | Freq: Every day | RESPIRATORY_TRACT | Status: DC
  Filled 2015-11-04: qty 14

## 2015-11-04 MED ORDER — CLOPIDOGREL BISULFATE 75 MG PO TABS: 75.0000 mg | ORAL_TABLET | Freq: Once | ORAL | Status: DC

## 2015-11-04 MED ORDER — HEPARIN SODIUM (PORCINE) 1000 UNIT/ML IJ SOLN
INTRAMUSCULAR | Status: DC | PRN
  Administered 2015-11-04: 2000 [IU] via INTRAVENOUS
  Administered 2015-11-04: 7000 [IU] via INTRAVENOUS
  Administered 2015-11-04: 4000 [IU] via INTRAVENOUS

## 2015-11-04 MED ORDER — INSULIN ASPART 100 UNIT/ML ~~LOC~~ SOLN
3.0000 [IU] | Freq: Three times a day (TID) | SUBCUTANEOUS | Status: DC
  Administered 2015-11-04 – 2015-11-05 (×2): 3 [IU] via SUBCUTANEOUS

## 2015-11-04 SURGICAL SUPPLY — 29 items
BALLN ANGIOSCULPT OTW 3.0X20 (BALLOONS) ×5
BALLN IN.PACT DCB 5X150 (BALLOONS) ×5
BALLOON ANGIOSCULPT OTW 3.0X20 (BALLOONS) ×1 IMPLANT
CATH ANGIO 5F PIGTAIL 65CM (CATHETERS) ×3 IMPLANT
CATH HAWKONE LX EXTENDED TIP (CATHETERS) ×3 IMPLANT
CATH INFINITI 5 FR IM (CATHETERS) ×3 IMPLANT
CATH QUICKCROSS .018X135CM (MICROCATHETER) ×3 IMPLANT
CATH STRAIGHT 5FR 65CM (CATHETERS) ×3 IMPLANT
DCB IN.PACT 5X150 (BALLOONS) ×1 IMPLANT
GUIDEWIRE REGALIA .014X300CM (WIRE) ×3 IMPLANT
KIT ENCORE 26 ADVANTAGE (KITS) ×3 IMPLANT
KIT PV (KITS) ×5 IMPLANT
LUBRICANT VIPERSLIDE CORONARY (MISCELLANEOUS) ×3 IMPLANT
NDL SMART REG 18GX2-3/4 (NEEDLE) IMPLANT
NEEDLE SMART REG 18GX2-3/4 (NEEDLE) ×5 IMPLANT
SHEATH HIGHFLEX ANSEL 7FR 55CM (SHEATH) ×3 IMPLANT
SHEATH PINNACLE 5F 10CM (SHEATH) ×3 IMPLANT
SHEATH PINNACLE 7F 10CM (SHEATH) ×3 IMPLANT
SHEATH PINNACLE MP 7F 45CM (SHEATH) ×3 IMPLANT
STOPCOCK MORSE 400PSI 3WAY (MISCELLANEOUS) ×3 IMPLANT
SYRINGE MEDRAD AVANTA MACH 7 (SYRINGE) ×3 IMPLANT
TAPE RADIOPAQUE TURBO (MISCELLANEOUS) ×3 IMPLANT
TRANSDUCER W/STOPCOCK (MISCELLANEOUS) ×5 IMPLANT
TRAY PV CATH (CUSTOM PROCEDURE TRAY) ×5 IMPLANT
TUBING CIL FLEX 10 FLL-RA (TUBING) ×3 IMPLANT
WIRE HITORQ VERSACORE ST 145CM (WIRE) ×3 IMPLANT
WIRE ROSEN-J .035X180CM (WIRE) ×3 IMPLANT
WIRE SPARTACORE .014X300CM (WIRE) ×3 IMPLANT
WIRE VERSACORE LOC 115CM (WIRE) ×3 IMPLANT

## 2015-11-04 NOTE — Care Management Note (Signed)
Case Management Note  Patient Details  Name: [name] [name] MRN: [id number] Date of Birth: 04/25/1945  Subjective/Objective:    S/p pv intervention, on plavix, patient is active with [facility] for [facility], [place], and Aide.  She is OIB  So she does not need resume orders. She would like to cont with [facility]. Patient is also on home oxygen with [facility], her daughter will be picking her up today and she will be bringing her oxygen tank with her. Patient has no problems with getting her medications. NCM will cont to follow for dc needs.                   Action/Plan:   Expected Discharge Date:                  Expected Discharge Plan:  [place]  In-House Referral:     Discharge planning Services  CM Consult  Post Acute Care Choice:   Resumption of services Choice offered to:     DME Arranged:    DME Agency:     HH Arranged:   HHRN, HHPT, [place] Agency:   [place]  Status of Service:  In process, will continue to follow  If discussed at Long Length of Stay Meetings, dates discussed:    Additional Comments:  [name] Mayo, RN 11/04/2015, 2:46 PM

## 2015-11-04 NOTE — Progress Notes (Signed)
Site area: left groin  Site Prior to Removal:  Level 0  Pressure Applied For 40 MINUTES    Minutes Beginning at 1530  Manual:   Yes.    Patient Status During Pull:  stable  Post Pull Groin Site:  Level 0  Post Pull Instructions Given:  Yes.    Post Pull Pulses Present:  Yes.    Dressing Applied:  Yes.    Comments:  Oozing at site after 20 minutes requiring holding 20 minutes additional til oozing resolved. No further bleeding noted during shift, level 0 and dressing remains dry and intact.

## 2015-11-04 NOTE — H&P (View-Only) (Signed)
  Perfect. Is Nicki Reaper going to be there.

## 2015-11-04 NOTE — Interval H&P Note (Signed)
History and Physical Interval Note:  11/04/2015 11:40 AM  [name] [name]  has presented today for surgery, with the diagnosis of PVD  The various methods of treatment have been discussed with the patient and family. After consideration of risks, benefits and other options for treatment, the patient has consented to  Procedure(s): Lower Extremity Angiography (N/A) as a surgical intervention .  The patient's history has been reviewed, patient examined, no change in status, stable for surgery.  I have reviewed the patient's chart and labs.  Questions were answered to the patient's satisfaction.     Quay Burow

## 2015-11-05 ENCOUNTER — Other Ambulatory Visit: Payer: Self-pay | Admitting: Cardiology

## 2015-11-05 DIAGNOSIS — I739 Peripheral vascular disease, unspecified: Secondary | ICD-10-CM | POA: Diagnosis not present

## 2015-11-05 DIAGNOSIS — E119 Type 2 diabetes mellitus without complications: Secondary | ICD-10-CM | POA: Diagnosis not present

## 2015-11-05 DIAGNOSIS — E785 Hyperlipidemia, unspecified: Secondary | ICD-10-CM | POA: Diagnosis not present

## 2015-11-05 DIAGNOSIS — I2510 Atherosclerotic heart disease of native coronary artery without angina pectoris: Secondary | ICD-10-CM | POA: Diagnosis not present

## 2015-11-05 DIAGNOSIS — I110 Hypertensive heart disease with heart failure: Secondary | ICD-10-CM | POA: Diagnosis not present

## 2015-11-05 DIAGNOSIS — Z951 Presence of aortocoronary bypass graft: Secondary | ICD-10-CM | POA: Diagnosis not present

## 2015-11-05 DIAGNOSIS — I70211 Atherosclerosis of native arteries of extremities with intermittent claudication, right leg: Secondary | ICD-10-CM | POA: Diagnosis not present

## 2015-11-05 LAB — CBC
HCT: 30.5 % — ABNORMAL LOW (ref 36.0–46.0)
HEMOGLOBIN: 9.3 g/dL — AB (ref 12.0–15.0)
MCH: 25.7 pg — AB (ref 26.0–34.0)
MCHC: 30.5 g/dL (ref 30.0–36.0)
MCV: 84.3 fL (ref 78.0–100.0)
PLATELETS: 233 10*3/uL (ref 150–400)
RBC: 3.62 MIL/uL — AB (ref 3.87–5.11)
RDW: 16.3 % — ABNORMAL HIGH (ref 11.5–15.5)
WBC: 6 10*3/uL (ref 4.0–10.5)

## 2015-11-05 LAB — BASIC METABOLIC PANEL
ANION GAP: 10 (ref 5–15)
BUN: 69 mg/dL — ABNORMAL HIGH (ref 6–20)
CALCIUM: 8.8 mg/dL — AB (ref 8.9–10.3)
CO2: 27 mmol/L (ref 22–32)
CREATININE: 1.68 mg/dL — AB (ref 0.44–1.00)
Chloride: 98 mmol/L — ABNORMAL LOW (ref 101–111)
GFR calc non Af Amer: 30 mL/min — ABNORMAL LOW (ref >60)
GFR, EST AFRICAN AMERICAN: 35 mL/min — AB (ref >60)
Glucose, Bld: 114 mg/dL — ABNORMAL HIGH (ref 65–99)
Potassium: 4.5 mmol/L (ref 3.5–5.1)
SODIUM: 135 mmol/L (ref 135–145)

## 2015-11-05 LAB — GLUCOSE, CAPILLARY: GLUCOSE-CAPILLARY: 124 mg/dL — AB (ref 65–99)

## 2015-11-05 NOTE — Progress Notes (Signed)
Patient Name: [name] [name] Date of Encounter: 11/05/2015  Primary Cardiologist: Dr. Gwenlyn Found, Dr. [facility] Problem List     Active Problems:   PAD (peripheral artery disease) [facility])   Claudication ([place])     Subjective   Feels well. No complaints.  Inpatient Medications    Scheduled Meds: . amLODipine  10 mg Oral Daily  . umeclidinium bromide  1 puff Inhalation Daily   And  . arformoterol  15 mcg Nebulization BID  . aspirin EC  81 mg Oral Daily  . atorvastatin  40 mg Oral q1800  . Chlorhexidine Gluconate Cloth  6 each Topical Q0600  . citalopram  40 mg Oral BH-q7a  . clopidogrel  75 mg Oral Q breakfast  . gabapentin  300 mg Oral Daily  . gabapentin  600 mg Oral QHS  . hydrALAZINE  75 mg Oral TID  . insulin aspart  3 Units Subcutaneous TID WC  . insulin detemir  7 Units Subcutaneous QHS  . isosorbide mononitrate  30 mg Oral Daily  . methimazole  10 mg Oral Daily  . multivitamin with minerals  1 tablet Oral Daily  . mupirocin ointment  1 application Nasal BID  . spironolactone  12.5 mg Oral Daily  . torsemide  40 mg Oral BID   Continuous Infusions:   PRN Meds: acetaminophen, albuterol, hydrALAZINE, HYDROcodone-acetaminophen, morphine injection, nitroGLYCERIN, ondansetron (ZOFRAN) IV   Vital Signs    Vitals:   11/04/15 2000 11/04/15 2039 11/04/15 2100 11/05/15 0312  BP: (!) 161/53   (!) 139/53  Pulse: (!) 54  (!) 51 (!) 58  Resp: 18  (!) 24 13  Temp:    (!) 96.8 F (36 C)  TempSrc:    Axillary  SpO2: 94% 94% 93% 91%  Weight:    202 lb 6.1 oz (91.8 kg)    Intake/Output Summary (Last 24 hours) at 11/05/15 0904 Last data filed at 11/05/15 9562  Gross per 24 hour  Intake             1140 ml  Output             1800 ml  Net             -660 ml   Filed Weights   11/05/15 0312  Weight: 202 lb 6.1 oz (91.8 kg)    Physical Exam   GEN: Well nourished, well developed, in no acute distress.  HEENT: Grossly normal.  Neck: Supple, no JVD, carotid  bruits, or masses. Cardiac: RRR, no murmurs, rubs, or gallops. No clubbing, cyanosis, edema.  Radials/DP/PT 2+ and equal bilaterally.  Respiratory:  Respirations regular and unlabored, clear to auscultation bilaterally. GI: Soft, nontender, nondistended, BS + x 4. MS: no deformity or atrophy. Skin: warm and dry, no rash. Neuro:  Strength and sensation are intact. Psych: AAOx3.  Normal affect.  Labs    CBC  Recent Labs  11/04/15 1031 11/05/15 0415  WBC 8.9 6.0  HGB 9.8* 9.3*  HCT 32.2* 30.5*  MCV 85.6 84.3  PLT 244 130   Basic Metabolic Panel  Recent Labs  11/04/15 1031 11/05/15 0415  NA 128* 135  K 4.9 4.5  CL 93* 98*  CO2 26 27  GLUCOSE 189* 114*  BUN 78* 69*  CREATININE 1.89* 1.68*  CALCIUM 8.9 8.8*     Telemetry    Sinus brady - Personally Reviewed  ECG    Sinus brady - Personally Reviewed  Radiology    No results  found.  Cardiac Studies   Peripheral Vascular Balloon Angioplasty  Lower Extremity Intervention  Peripheral Vascular Atherectomy 11/04/15   Final Impression: Successful [place] one directional atherectomy followed by drug-eluting balloon angioplasty of high-grade calcified segmental mid right SFA stenosis as well as cutting balloon atherectomy of high-grade tibioperoneal trunk stenosis for lifestyle limiting claudication. She does have only 1 vessel runoff. She is on dual and type of therapy. The sheath will be removed once the ACT falls below 170 and pressure held. She'll be hydrated overnight and discharged home in the morning if her renal function remained stable. We will obtain lower extremity arterial Doppler studies in our [facility] line office next week and I will see her back in 2-3 weeks thereafter.   Patient Profile     [name] [name] is a 70 year old female with a past medical history of CAD s/p CABG in 2004, carotid artery disease, chronic diastolic CHF, HTN, PAD, DM, and CVA. She underwent successful [place] one directional atherectomy  followed by drug-eluting balloon angioplasty of high-grade calcified segmental mid right SFA stenosis as well as cutting balloon atherectomy of high-grade tibioperoneal trunk stenosis on 11/04/15.   Assessment & Plan    1. PAD s/p atherectomy and drug eluding balloon angioplasty to R SFA: See PV report above. She will need lower extremity arterial Doppler studies in our [facility] office next week and Dr. Gwenlyn Found will see her back in 2-3 weeks thereafter. She will continue ASA and Plavix.   2. HTN: Continue current regimen.   3. HLD: Continue statin.   4. Chronic diastolic CHF: Continue torsemide, spiro, isosorbide. Not on beta blocker due to bradycardia.   Signed, Arbutus Leas, NP  11/05/2015, 9:04 AM  Patient seen and examined and history reviewed. Agree with above findings and plan. Doing well post procedure. No groin hematoma. Renal function is stable. Will DC home today on ASA and Plavix.  [name] [name], [name] [name] 11/05/2015 10:12 AM

## 2015-11-05 NOTE — Progress Notes (Signed)
rt

## 2015-11-05 NOTE — Care Management Note (Signed)
Case Management Note  Patient Details  Name: [name] [name] MRN: [id number] Date of Birth: [birth year]  Subjective/Objective:   S/p pv intervention, on plavix, patient is active with [facility] for [facility], [place], and Aide.  She is OIB  So she does not need resume orders. She would like to cont with [facility]. Patient is also on home oxygen with [facility], her daughter will be picking her up today and she will be bringing her oxygen tank with her. Patient has no problems with getting her medications.                 Action/Plan:   Expected Discharge Date:                  Expected Discharge Plan:  [place]  In-House Referral:     Discharge planning Services  CM Consult  Post Acute Care Choice:  Resumption of Svcs/PTA Provider Choice offered to:     DME Arranged:    DME Agency:     HH Arranged:  RN, PT, Nurse's Aide [place] Agency:  [place]  Status of Service:  Completed, signed off  If discussed at [place] of Stay Meetings, dates discussed:    Additional Comments:  Zenon Mayo, RN 11/05/2015, 10:40 AM

## 2015-11-05 NOTE — Discharge Summary (Signed)
Discharge Summary    Patient ID: [name] [name],  MRN: [id number], DOB/AGE: 03-13-1945 70 y.o.  Admit date: 11/04/2015 Discharge date: 11/05/2015  Primary Care Provider: Glenda Chroman Primary Cardiologist: Dr. Gwenlyn Found, Dr. Domenic Polite  Discharge Diagnoses    Active Problems:   PAD (peripheral artery disease) (HCC)   Claudication ([place])   Allergies Allergies  Allergen Reactions  . Other Shortness Of Breath, Rash and Other (See Comments)    CANNOT EAT ANY BERRIES!!  . Strawberry Extract Hives, Shortness Of Breath and Rash  . Sulfa Antibiotics Swelling and Other (See Comments)    Bodily Swelling  . Coffee Bean Extract [Coffea Arabica] Itching and Rash  . Tape Other (See Comments)    Tears skin.  Please use "paper" tape only.    Diagnostic Studies/Procedures   Peripheral Vascular Balloon Angioplasty  Lower Extremity Intervention  Peripheral Vascular Atherectomy 11/04/15   IMPRESSION: Miss [name] has high-grade segmental calcified mid right SFA disease with 1 vessel runoff via peroneal and high-grade tibioperoneal trunk disease. We will proceed with [facility] one directional atherectomy followed by drug-eluting balloon angioplasty of the mid right SFA and cutting balloon atherectomy of the tibioperoneal trunk.   Final Impression: Successful [place] one directional atherectomy followed by drug-eluting balloon angioplasty of high-grade calcified segmental mid right SFA stenosis as well as cutting balloon atherectomy of high-grade tibioperoneal trunk stenosis for lifestyle limiting claudication. She does have only 1 vessel runoff. She is on dual anti-platelet therapy. The sheath will be removed once the ACT falls below 170 and pressure held. She'll be hydrated overnight and discharged home in the morning if her renal function remained stable. We will obtain lower extremity arterial Doppler studies in our [facility] line office next week and I will see her back in 2-3 weeks  thereafter.  _____________   History of Present Illness   [name] [name] is a 70 year old female with a past medical history of HTN, CVA, CAD s/p CABG in 2004, bilateral carotid endarterectomies,HLD, and DM. She also has an extensive history of PAD and is seen by Dr. Gwenlyn Found. She had lower extremity arterial Doppler studies performed in the office 08/27/13 revealed a left ABI of 0.62 with occluded left SFA. Subsequently, she was sent for PV angiography on 10/06/13 and had an unsuccessful attempt at revascularization of her left SFA and was sent for left femoral below knee bypass grafting.  She was seen by Dr. Gwenlyn Found on 10/19/15 in the office with right calf claudication and was referred for PV Angiogram on 11/04/15.   Hospital Course   [name] [name]  had successful [facility] one directional atherectomy followed by drug-eluting balloon angioplasty of high-grade calcified segmental mid right SFA stenosis as well as cutting balloon atherectomy of high-grade tibioperoneal trunk stenosis for lifestyle limiting claudication.  She will remain on DAPT with Plavix and ASA, she was on this prior to admission. She will need lower extremity arterial Doppler studies in our [facility] office next week and Dr. Gwenlyn Found will see her back in 2-3 weeks thereafter.   She also has a history of chronic diastolic CHF, no changes were made to her medication regimen. Continue torsemide, spiro, isosorbide. Not on beta blocker due to bradycardia.  She was stable post procedure and was seen today by Dr. [place] and deemed suitable for discharge.   _____________  Discharge Vitals Blood pressure (!) 151/33, pulse 71, temperature 97 F (36.1 C), temperature source Oral, resp. rate (!) 24, height _0  (1.676 m),  weight 202 lb 6.1 oz (91.8 kg), SpO2 92 %.  Filed Weights   11/05/15 0312  Weight: 202 lb 6.1 oz (91.8 kg)    Labs & Radiologic Studies     CBC  Recent Labs  11/04/15 1031 11/05/15 0415  WBC 8.9 6.0  HGB 9.8* 9.3*  HCT  32.2* 30.5*  MCV 85.6 84.3  PLT 244 650   Basic Metabolic Panel  Recent Labs  11/04/15 1031 11/05/15 0415  NA 128* 135  K 4.9 4.5  CL 93* 98*  CO2 26 27  GLUCOSE 189* 114*  BUN 78* 69*  CREATININE 1.89* 1.68*  CALCIUM 8.9 8.8*    No results found.  Disposition   Pt is being discharged home today in good condition.  Follow-up Plans & Appointments    Follow-up Information    [facility] Follow up on 11/19/2015.   Specialty:  Cardiology Why:  at 3:30pm for follow up dopplers of your legs.  Contact information: [address] [place]       [name] Burow, MD Follow up on 11/05/2015.   Specialties:  Cardiology, Radiology Why:  Our office will call you  Contact information: [address] [name] [address] [phone]            Discharge Medications   Current Discharge Medication List    CONTINUE these medications which have NOT CHANGED   Details  acetaminophen (TYLENOL) 500 MG tablet Take 500-1,000 mg by mouth every 6 (six) hours as needed for mild pain or moderate pain.     amLODipine (NORVASC) 10 MG tablet TAKE ONE TABLET BY MOUTH ONCE DAILY Qty: 90 tablet, Refills: 3    aspirin 81 MG EC tablet Take 81 mg by mouth daily.    atorvastatin (LIPITOR) 40 MG tablet Take 40 mg by mouth daily at 6 PM.    citalopram (CELEXA) 40 MG tablet Take 40 mg by mouth every morning.     clopidogrel (PLAVIX) 75 MG tablet TAKE ONE TABLET BY MOUTH ONCE DAILY WITH BREAKFAST Qty: 30 tablet, Refills: 6    Docusate Calcium (STOOL SOFTENER PO) Take 200 mg by mouth at bedtime as needed (for constipation).     gabapentin (NEURONTIN) 300 MG capsule Take 300-600 mg by mouth See admin instructions. 300 mg in the morning and 600 mg prior to bed(time)    hydrALAZINE (APRESOLINE) 50 MG tablet Take 1.5 tablets (75 mg total) by mouth 3 (three) times daily. Qty: 100 tablet, Refills: 3     HYDROcodone-acetaminophen (NORCO) 7.5-325 MG tablet Take 1 tablet by mouth every 6 (six) hours as needed for moderate pain.    insulin aspart (NOVOLOG) 100 UNIT/ML injection Inject 3 Units into the skin 3 (three) times daily with meals. Qty: 10 mL, Refills: 11    insulin detemir (LEVEMIR) 100 UNIT/ML injection Inject 7 Units into the skin at bedtime.    isosorbide mononitrate (IMDUR) 30 MG 24 hr tablet TAKE ONE TABLET BY MOUTH ONCE DAILY Qty: 30 tablet, Refills: 3    methimazole (TAPAZOLE) 10 MG tablet Take 1 tablet (10 mg total) by mouth daily. Qty: 30 tablet, Refills: 3    Multiple Vitamin (MULTIVITAMIN WITH MINERALS) TABS tablet Take 1 tablet by mouth daily.    nitroGLYCERIN (NITROSTAT) 0.4 MG SL tablet Place 1 tablet (0.4 mg total) under the tongue every 5 (five) minutes as needed for chest pain. Up to 3 doses. If no relief after 3rd dose, proceed to  the ED for an evaluation Qty: 25 tablet, Refills: 3    spironolactone (ALDACTONE) 25 MG tablet Take 0.5 tablets (12.5 mg total) by mouth daily. Qty: 16 tablet, Refills: 6    torsemide (DEMADEX) 20 MG tablet Take 2 tablets (40 mg total) by mouth 2 (two) times daily. Qty: 30 tablet, Refills: 0    umeclidinium-vilanterol (ANORO ELLIPTA) 62.5-25 MCG/INH AEPB Inhale 1 puff into the lungs daily.    albuterol (PROVENTIL HFA;VENTOLIN HFA) 108 (90 BASE) MCG/ACT inhaler Inhale 2 puffs into the lungs daily as needed for wheezing or shortness of breath.           Outstanding Labs/Studies     Duration of Discharge Encounter   Greater than 30 minutes including physician time.  Signed, Arbutus Leas NP 11/05/2015, 12:35 PM

## 2015-11-08 ENCOUNTER — Telehealth (HOSPITAL_COMMUNITY): Payer: Self-pay | Admitting: *Deleted

## 2015-11-08 DIAGNOSIS — N183 Chronic kidney disease, stage 3 (moderate): Secondary | ICD-10-CM | POA: Diagnosis not present

## 2015-11-08 DIAGNOSIS — E1165 Type 2 diabetes mellitus with hyperglycemia: Secondary | ICD-10-CM | POA: Diagnosis not present

## 2015-11-08 DIAGNOSIS — I5033 Acute on chronic diastolic (congestive) heart failure: Secondary | ICD-10-CM | POA: Diagnosis not present

## 2015-11-08 DIAGNOSIS — I130 Hypertensive heart and chronic kidney disease with heart failure and stage 1 through stage 4 chronic kidney disease, or unspecified chronic kidney disease: Secondary | ICD-10-CM | POA: Diagnosis not present

## 2015-11-08 DIAGNOSIS — J90 Pleural effusion, not elsewhere classified: Secondary | ICD-10-CM | POA: Diagnosis not present

## 2015-11-08 DIAGNOSIS — E1122 Type 2 diabetes mellitus with diabetic chronic kidney disease: Secondary | ICD-10-CM | POA: Diagnosis not present

## 2015-11-08 MED ORDER — METOLAZONE 2.5 MG PO TABS: 2.5000 mg | ORAL_TABLET | ORAL | 0 refills | Status: DC

## 2015-11-08 NOTE — Telephone Encounter (Signed)
[name] [name] called concerned about [name] [name], she states her wt is up 9lb from 196 to 205 lb since last Monday, and up from 186 when she was d/c'd from hospital.  She states last week pcp had decreased Tor from 40 bid to 40 daily and then Dr Gwenlyn Found held tor completely for 2 days prior to angiogram and gave her fluid.  She states over weekend [name] [name] did restart tor 40 mg bid.  [name] [name] is SOB with exertion and has LE edema with ankle measurements increased from 21 to 24 and abd distention with measurement 110 to 116.  Discussed w/[name] Ninfa Meeker, NP she recommends [name] [name] take metolazone 2.5 mg for 2 days, have bmet and keep f/u appt as sch 11/6.  [name] [name] is aware and will advise [name] [name] rx has been sent in, she will draw bmet tomorrow and f/u with how [name] [name] is doing.

## 2015-11-09 DIAGNOSIS — I130 Hypertensive heart and chronic kidney disease with heart failure and stage 1 through stage 4 chronic kidney disease, or unspecified chronic kidney disease: Secondary | ICD-10-CM | POA: Diagnosis not present

## 2015-11-09 DIAGNOSIS — J90 Pleural effusion, not elsewhere classified: Secondary | ICD-10-CM | POA: Diagnosis not present

## 2015-11-09 DIAGNOSIS — E1165 Type 2 diabetes mellitus with hyperglycemia: Secondary | ICD-10-CM | POA: Diagnosis not present

## 2015-11-09 DIAGNOSIS — N183 Chronic kidney disease, stage 3 (moderate): Secondary | ICD-10-CM | POA: Diagnosis not present

## 2015-11-09 DIAGNOSIS — E1122 Type 2 diabetes mellitus with diabetic chronic kidney disease: Secondary | ICD-10-CM | POA: Diagnosis not present

## 2015-11-09 DIAGNOSIS — I5033 Acute on chronic diastolic (congestive) heart failure: Secondary | ICD-10-CM | POA: Diagnosis not present

## 2015-11-11 ENCOUNTER — Other Ambulatory Visit: Payer: Self-pay | Admitting: Cardiovascular Disease

## 2015-11-11 ENCOUNTER — Ambulatory Visit (HOSPITAL_BASED_OUTPATIENT_CLINIC_OR_DEPARTMENT_OTHER): Payer: Medicare Other

## 2015-11-11 DIAGNOSIS — I739 Peripheral vascular disease, unspecified: Secondary | ICD-10-CM

## 2015-11-12 DIAGNOSIS — I130 Hypertensive heart and chronic kidney disease with heart failure and stage 1 through stage 4 chronic kidney disease, or unspecified chronic kidney disease: Secondary | ICD-10-CM | POA: Diagnosis not present

## 2015-11-12 DIAGNOSIS — E1122 Type 2 diabetes mellitus with diabetic chronic kidney disease: Secondary | ICD-10-CM | POA: Diagnosis not present

## 2015-11-12 DIAGNOSIS — J90 Pleural effusion, not elsewhere classified: Secondary | ICD-10-CM | POA: Diagnosis not present

## 2015-11-12 DIAGNOSIS — E1165 Type 2 diabetes mellitus with hyperglycemia: Secondary | ICD-10-CM | POA: Diagnosis not present

## 2015-11-12 DIAGNOSIS — I5033 Acute on chronic diastolic (congestive) heart failure: Secondary | ICD-10-CM | POA: Diagnosis not present

## 2015-11-12 DIAGNOSIS — N183 Chronic kidney disease, stage 3 (moderate): Secondary | ICD-10-CM | POA: Diagnosis not present

## 2015-11-15 ENCOUNTER — Ambulatory Visit (HOSPITAL_COMMUNITY)
Admission: RE | Admit: 2015-11-15 | Discharge: 2015-11-15 | Disposition: A | Discharge status: Home / Self Care | Payer: Medicare Other | Source: Ambulatory Visit | Attending: Cardiology | Admitting: Cardiology

## 2015-11-15 VITALS — BP 122/50 | HR 65 | Wt 195.0 lb

## 2015-11-15 DIAGNOSIS — I10 Essential (primary) hypertension: Secondary | ICD-10-CM | POA: Diagnosis present

## 2015-11-15 DIAGNOSIS — Z8673 Personal history of transient ischemic attack (TIA), and cerebral infarction without residual deficits: Secondary | ICD-10-CM | POA: Insufficient documentation

## 2015-11-15 DIAGNOSIS — E0590 Thyrotoxicosis, unspecified without thyrotoxic crisis or storm: Secondary | ICD-10-CM | POA: Diagnosis not present

## 2015-11-15 DIAGNOSIS — I252 Old myocardial infarction: Secondary | ICD-10-CM | POA: Insufficient documentation

## 2015-11-15 DIAGNOSIS — R0683 Snoring: Secondary | ICD-10-CM | POA: Diagnosis not present

## 2015-11-15 DIAGNOSIS — Z951 Presence of aortocoronary bypass graft: Secondary | ICD-10-CM | POA: Diagnosis not present

## 2015-11-15 DIAGNOSIS — E1122 Type 2 diabetes mellitus with diabetic chronic kidney disease: Secondary | ICD-10-CM | POA: Diagnosis not present

## 2015-11-15 DIAGNOSIS — N183 Chronic kidney disease, stage 3 unspecified: Secondary | ICD-10-CM

## 2015-11-15 DIAGNOSIS — I5020 Unspecified systolic (congestive) heart failure: Secondary | ICD-10-CM

## 2015-11-15 DIAGNOSIS — I5033 Acute on chronic diastolic (congestive) heart failure: Secondary | ICD-10-CM | POA: Insufficient documentation

## 2015-11-15 DIAGNOSIS — I2510 Atherosclerotic heart disease of native coronary artery without angina pectoris: Secondary | ICD-10-CM

## 2015-11-15 DIAGNOSIS — F329 Major depressive disorder, single episode, unspecified: Secondary | ICD-10-CM | POA: Diagnosis not present

## 2015-11-15 DIAGNOSIS — I739 Peripheral vascular disease, unspecified: Secondary | ICD-10-CM | POA: Diagnosis not present

## 2015-11-15 DIAGNOSIS — I6523 Occlusion and stenosis of bilateral carotid arteries: Secondary | ICD-10-CM | POA: Diagnosis not present

## 2015-11-15 DIAGNOSIS — I5032 Chronic diastolic (congestive) heart failure: Secondary | ICD-10-CM

## 2015-11-15 LAB — BASIC METABOLIC PANEL
Anion gap: 11 (ref 5–15)
BUN: 66 mg/dL — AB (ref 6–20)
CHLORIDE: 92 mmol/L — AB (ref 101–111)
CO2: 29 mmol/L (ref 22–32)
CREATININE: 1.68 mg/dL — AB (ref 0.44–1.00)
Calcium: 9.3 mg/dL (ref 8.9–10.3)
GFR calc Af Amer: 35 mL/min — ABNORMAL LOW (ref >60)
GFR calc non Af Amer: 30 mL/min — ABNORMAL LOW (ref >60)
Glucose, Bld: 253 mg/dL — ABNORMAL HIGH (ref 65–99)
POTASSIUM: 3.7 mmol/L (ref 3.5–5.1)
Sodium: 132 mmol/L — ABNORMAL LOW (ref 135–145)

## 2015-11-15 LAB — LIPID PANEL
CHOL/HDL RATIO: 3.5 ratio
Cholesterol: 78 mg/dL (ref 0–200)
HDL: 22 mg/dL — ABNORMAL LOW (ref >40)
LDL CALC: 36 mg/dL (ref 0–99)
Triglycerides: 102 mg/dL (ref <150)
VLDL: 20 mg/dL (ref 0–40)

## 2015-11-15 LAB — BRAIN NATRIURETIC PEPTIDE: B Natriuretic Peptide: 769.9 pg/mL — ABNORMAL HIGH (ref 0.0–100.0)

## 2015-11-15 MED ORDER — POTASSIUM CHLORIDE CRYS ER 20 MEQ PO TBCR: 20.0000 meq | EXTENDED_RELEASE_TABLET | Freq: Every day | ORAL | 3 refills | Status: DC

## 2015-11-15 MED ORDER — TORSEMIDE 20 MG PO TABS: ORAL_TABLET | ORAL | 3 refills | Status: DC

## 2015-11-15 NOTE — Patient Instructions (Addendum)
Labs today (will call for abnormal results, otherwise no news is good news)  Increase Torsemide 80 mg (4 Tabs) in the morning, 40 mg (2 Tabs) in the evening  Start Potassium 62mq Daily  Referrals for Sleep Study and Endocrinology   Follow up in 1 week with labs  Compression Stockings to be worn at all times

## 2015-11-15 NOTE — Progress Notes (Signed)
PCP: Dr. Woody Seller Cardiology: Dr. Domenic Polite HF Cardiology: Dr. Aundra Dubin  70 yo with history of chronic diastolic CHF, CAD s/p CABG, chronic right pleural effusion, CKD stage III, and PAD presents for CHF clinic evaluation.  She has had multiple admissions with diastolic CHF complicated by CKD.  She was admitted in 7/17 for diuresis.  Worsening renal function was noted.  Lisinopril was stopped with elevated creatinine and Coreg was stopped with bradycardia.  Last echo in 7/17 showed EF 55-60% with grade II diastolic dysfunction and D-shaped interventricular septum.  She has been wearing home oxygen for > 1 year.    She was admitted again in 9/17 with volume overload.  She was diuresed extensively.  Pleurx catheter was removed from right chest.  RHC showed elevated right and left heart filling pressure with pulmonary venous hypertension.  She was diagnosed with methimazole and started on methimazole.   After discharge, torsemide was held for 3 days in 10/17 for PV procedure => right SFA and right tibioperoneal trunk angioplasties.  She still gets cramps in her right leg, but this is now better.    She has ongoing low back pain.  Weight is up 7 lbs since last appointment.  She uses a walker.  No dyspnea in house.  She does get short of breath walking 100-200 feet.  Still has not had endocrinology appointment.  +Chronic orthopnea. No chest pain, no lightheadedness.   ECG: NSR, old anterior MI   Labs (7/17): K 4.2, creatinine 2.04 Labs (10/17): K 4.5, creatinine 1.68, HCT 30.5  PMH: 1. Chronic diastolic CHF: Multiple admissions.  - Echo (7/17) with EF 55-60%, grade II diastolic dysfunction, D-shaped interventricular septum, mild MR, PASP 52 mmHg.  - Uses home oxygen.  - RHC (9/17): mean 15, PA 73/26 mean 42, mean PCWP 25, CI 2.84, PVR 3.0 WU => pulmonary venous hypertension.  2. H/o CVA 3. Recurrent right pleural effusion: S/p Pleurx catheter placement.  4. Depression 5. HTN 6. Carotid stenosis:  Bilateral CEAs 7. Type II diabetes.  8. CAD: NSTEMI 11/12, had CABG with LIMA-LAD, SVG-PLV, SVG-OM, SVG-D.   - LHC (9/15) with DES to SVG-D.  Other grafts patent.  9. PAD: Left femoral-popliteal bypass 9/15.  - peripheral arterial dopplers (2/17) with right ABI 0.53, left ABI 1.02.  - 10/17 right SFA atherectomy and right tibioperoneal trunk atherectomy.  10. CKD stage III. Lisinopril stopped with increased creatinine.  11. Bradycardia: Stopped Coreg.  12. Hyperthyroidism: Diagnosed in 9/17.   SH: Lives in [place] with son.  Prior smoker, quit 2012.  Retired.   Family History  Problem Relation Age of Onset  . Coronary artery disease Father     Died with MI age 68  . Heart disease Father     before age 39  . Heart attack Father   . Diabetes type II Mother   . Hypertension Mother   . Diabetes Mother   . Heart disease Mother   . Hyperlipidemia Mother   . Heart failure Sister   . Cancer Sister   . Cancer Brother     Lung cancer  . Diabetes Daughter   . Hyperlipidemia Daughter   . Diabetes Son   . Hyperlipidemia Son    ROS: All systems reviewed and negative except as per HPI.   BP (!) 122/50   Pulse 65   Wt 195 lb (88.5 kg)   SpO2 95% Comment: 3L  BMI 31.47 kg/m  General: NAD Neck: JVP 14-16 cm, no thyromegaly or thyroid  nodule.  Lungs: Crackles at bases bilaterally.  CV: Nondisplaced PMI.  Heart regular S1/S2, no S3/S4, 2/6 SEM RUSB. 1+ edema to knees bilaterally.  No carotid bruit. Difficult to palpate pedal pulses.  Abdomen: Soft, nontender, no hepatosplenomegaly, no distention.  Skin: Intact without lesions or rashes.  Neurologic: Alert and oriented x 3. Fine tremor noted.  Psych: Normal affect. Extremities: No clubbing.   HEENT: Normal.   Assessment/plan: 1. Acute on chronic diastolic CHF: I suspect that there is a significant component of RV failure as well.  Weight is up again.  She is significantly volume overloaded on exam. Diuresis is complicated by CKD stage  III, suspect cardiorenal syndrome.   - Increase torsemide to 80 qam/40 qpm.  Add KCl 20 daily. - Wear graded compression stockings during the day.   - Followup in 1 week in CHF clinic for reassessment, will need BMET.  2. CKD stage III: BMET in 1 week.   3. CAD: s/p CABG.  No chest pain.  H/o PCI to SVG-D in 9/15.  - Continue ASA 81, Plavix, statin.  4. Recurrent right pleural effusion: Pleurx catheter removed in 9/17. 5. Suspect OSA: Will need sleep study as outpatient => will arrange.  6. PAD: s/p recent right leg atherectomies.  Some improvement in cramping.  No sores on legs/feet.   7. Hyperlipidemia: Goal LDL < 70, check lipids today.  8. Hyperthyroidism: Has not seen endocrinology.  He remains on methimazole.  I will have him see Dr. Dorris Fetch in [place] asap.   Loralie Champagne 11/15/2015 11:09 PM

## 2015-11-16 ENCOUNTER — Telehealth (HOSPITAL_COMMUNITY): Payer: Self-pay | Admitting: *Deleted

## 2015-11-16 DIAGNOSIS — I509 Heart failure, unspecified: Secondary | ICD-10-CM | POA: Diagnosis not present

## 2015-11-16 DIAGNOSIS — I2510 Atherosclerotic heart disease of native coronary artery without angina pectoris: Secondary | ICD-10-CM | POA: Diagnosis not present

## 2015-11-16 DIAGNOSIS — I130 Hypertensive heart and chronic kidney disease with heart failure and stage 1 through stage 4 chronic kidney disease, or unspecified chronic kidney disease: Secondary | ICD-10-CM | POA: Diagnosis not present

## 2015-11-16 DIAGNOSIS — E1122 Type 2 diabetes mellitus with diabetic chronic kidney disease: Secondary | ICD-10-CM | POA: Diagnosis not present

## 2015-11-16 DIAGNOSIS — N183 Chronic kidney disease, stage 3 (moderate): Secondary | ICD-10-CM | POA: Diagnosis not present

## 2015-11-16 DIAGNOSIS — J90 Pleural effusion, not elsewhere classified: Secondary | ICD-10-CM | POA: Diagnosis not present

## 2015-11-16 DIAGNOSIS — E1165 Type 2 diabetes mellitus with hyperglycemia: Secondary | ICD-10-CM | POA: Diagnosis not present

## 2015-11-16 DIAGNOSIS — I5033 Acute on chronic diastolic (congestive) heart failure: Secondary | ICD-10-CM | POA: Diagnosis not present

## 2015-11-16 DIAGNOSIS — E119 Type 2 diabetes mellitus without complications: Secondary | ICD-10-CM | POA: Diagnosis not present

## 2015-11-16 NOTE — Telephone Encounter (Signed)
-----  Message from Larey Dresser, MD sent at 11/15/2015  4:03 PM EST ----- Good lipids.  Diuretics increased today.

## 2015-11-16 NOTE — Telephone Encounter (Signed)
Notes Recorded by Harvie Junior, [place] on 11/16/2015 at 10:00 AM EST Labs faxed to PCP per patients request. ------  Notes Recorded by Larey Dresser, MD on 11/15/2015 at 4:03 PM EST Good lipids. Diuretics increased today.    Ref Range & Units 1d ago 11d ago 12d ago   Sodium 135 - 145 mmol/L 132   135CM  128     Potassium 3.5 - 5.1 mmol/L 3.7  4.5  4.9    Chloride 101 - 111 mmol/L 92   98   93     CO2 22 - 32 mmol/L _0 Glucose, Bld 65 - 99 mg/dL 253   114   189     BUN 6 - 20 mg/dL 66   69   78     Creatinine, Ser 0.44 - 1.00 mg/dL 1.68   1.68   1.89     Calcium 8.9 - 10.3 mg/dL 9.3  8.8   8.9    GFR calc non Af Amer >60 mL/min _1 GFR calc Af Amer >60 mL/min 35   35CM   30CM

## 2015-11-18 DIAGNOSIS — E1122 Type 2 diabetes mellitus with diabetic chronic kidney disease: Secondary | ICD-10-CM | POA: Diagnosis not present

## 2015-11-18 DIAGNOSIS — J90 Pleural effusion, not elsewhere classified: Secondary | ICD-10-CM | POA: Diagnosis not present

## 2015-11-18 DIAGNOSIS — I5033 Acute on chronic diastolic (congestive) heart failure: Secondary | ICD-10-CM | POA: Diagnosis not present

## 2015-11-18 DIAGNOSIS — N183 Chronic kidney disease, stage 3 (moderate): Secondary | ICD-10-CM | POA: Diagnosis not present

## 2015-11-18 DIAGNOSIS — E1165 Type 2 diabetes mellitus with hyperglycemia: Secondary | ICD-10-CM | POA: Diagnosis not present

## 2015-11-18 DIAGNOSIS — I130 Hypertensive heart and chronic kidney disease with heart failure and stage 1 through stage 4 chronic kidney disease, or unspecified chronic kidney disease: Secondary | ICD-10-CM | POA: Diagnosis not present

## 2015-11-19 ENCOUNTER — Ambulatory Visit (HOSPITAL_COMMUNITY)
Admission: RE | Admit: 2015-11-19 | Discharge: 2015-11-19 | Disposition: A | Discharge status: Home / Self Care | Payer: Medicare Other | Source: Ambulatory Visit | Attending: Cardiology | Admitting: Cardiology

## 2015-11-19 DIAGNOSIS — I739 Peripheral vascular disease, unspecified: Secondary | ICD-10-CM | POA: Diagnosis not present

## 2015-11-22 ENCOUNTER — Telehealth: Payer: Self-pay | Admitting: Cardiology

## 2015-11-22 DIAGNOSIS — I5033 Acute on chronic diastolic (congestive) heart failure: Secondary | ICD-10-CM | POA: Diagnosis not present

## 2015-11-22 DIAGNOSIS — J90 Pleural effusion, not elsewhere classified: Secondary | ICD-10-CM | POA: Diagnosis not present

## 2015-11-22 DIAGNOSIS — I130 Hypertensive heart and chronic kidney disease with heart failure and stage 1 through stage 4 chronic kidney disease, or unspecified chronic kidney disease: Secondary | ICD-10-CM | POA: Diagnosis not present

## 2015-11-22 DIAGNOSIS — E1165 Type 2 diabetes mellitus with hyperglycemia: Secondary | ICD-10-CM | POA: Diagnosis not present

## 2015-11-22 DIAGNOSIS — E1122 Type 2 diabetes mellitus with diabetic chronic kidney disease: Secondary | ICD-10-CM | POA: Diagnosis not present

## 2015-11-22 DIAGNOSIS — N183 Chronic kidney disease, stage 3 (moderate): Secondary | ICD-10-CM | POA: Diagnosis not present

## 2015-11-22 NOTE — Telephone Encounter (Signed)
This patient is followed in [facility], will forward

## 2015-11-22 NOTE — Telephone Encounter (Signed)
New Message;   Last week Dr Aundra Dubin increased pt's Furosemide. Her weight last week was 194 and this week it is 197.Please call to advise.

## 2015-11-22 NOTE — Telephone Encounter (Signed)
Spoke w/Kathy, she states pt wt is up 3 lbs from last week.  She states pt's measurements have not increased in ankles or abd (110 cm).  Pt denies SOB.  She states pt wt was 205 last week then dropped to 194 and now is back up to 197.  She states pt is eating low salt, pt having normal BM's, and is staying under 2 L of fluids daily.  She has advised pt to call her if wt increases anymore.  Pt is sch to see [place] on Thur.

## 2015-11-23 DIAGNOSIS — E1165 Type 2 diabetes mellitus with hyperglycemia: Secondary | ICD-10-CM | POA: Diagnosis not present

## 2015-11-23 DIAGNOSIS — J90 Pleural effusion, not elsewhere classified: Secondary | ICD-10-CM | POA: Diagnosis not present

## 2015-11-23 DIAGNOSIS — I5033 Acute on chronic diastolic (congestive) heart failure: Secondary | ICD-10-CM | POA: Diagnosis not present

## 2015-11-23 DIAGNOSIS — E1122 Type 2 diabetes mellitus with diabetic chronic kidney disease: Secondary | ICD-10-CM | POA: Diagnosis not present

## 2015-11-23 DIAGNOSIS — I130 Hypertensive heart and chronic kidney disease with heart failure and stage 1 through stage 4 chronic kidney disease, or unspecified chronic kidney disease: Secondary | ICD-10-CM | POA: Diagnosis not present

## 2015-11-23 DIAGNOSIS — N183 Chronic kidney disease, stage 3 (moderate): Secondary | ICD-10-CM | POA: Diagnosis not present

## 2015-11-25 ENCOUNTER — Encounter (HOSPITAL_COMMUNITY): Payer: Self-pay

## 2015-11-25 ENCOUNTER — Other Ambulatory Visit: Payer: Self-pay | Admitting: Cardiology

## 2015-11-25 ENCOUNTER — Ambulatory Visit (HOSPITAL_COMMUNITY)
Admission: RE | Admit: 2015-11-25 | Discharge: 2015-11-25 | Disposition: A | Discharge status: Home / Self Care | Payer: Medicare Other | Source: Ambulatory Visit | Attending: Internal Medicine | Admitting: Internal Medicine

## 2015-11-25 VITALS — BP 122/64 | HR 69 | Wt 193.2 lb

## 2015-11-25 DIAGNOSIS — I5042 Chronic combined systolic (congestive) and diastolic (congestive) heart failure: Secondary | ICD-10-CM

## 2015-11-25 DIAGNOSIS — Z0001 Encounter for general adult medical examination with abnormal findings: Secondary | ICD-10-CM | POA: Diagnosis not present

## 2015-11-25 DIAGNOSIS — N183 Chronic kidney disease, stage 3 unspecified: Secondary | ICD-10-CM

## 2015-11-25 DIAGNOSIS — E118 Type 2 diabetes mellitus with unspecified complications: Secondary | ICD-10-CM

## 2015-11-25 DIAGNOSIS — Z951 Presence of aortocoronary bypass graft: Secondary | ICD-10-CM | POA: Diagnosis not present

## 2015-11-25 DIAGNOSIS — Z7982 Long term (current) use of aspirin: Secondary | ICD-10-CM | POA: Diagnosis not present

## 2015-11-25 DIAGNOSIS — E782 Mixed hyperlipidemia: Secondary | ICD-10-CM

## 2015-11-25 DIAGNOSIS — E1122 Type 2 diabetes mellitus with diabetic chronic kidney disease: Secondary | ICD-10-CM | POA: Insufficient documentation

## 2015-11-25 DIAGNOSIS — Z794 Long term (current) use of insulin: Secondary | ICD-10-CM | POA: Insufficient documentation

## 2015-11-25 DIAGNOSIS — I2510 Atherosclerotic heart disease of native coronary artery without angina pectoris: Secondary | ICD-10-CM | POA: Diagnosis not present

## 2015-11-25 DIAGNOSIS — E0590 Thyrotoxicosis, unspecified without thyrotoxic crisis or storm: Secondary | ICD-10-CM | POA: Diagnosis not present

## 2015-11-25 DIAGNOSIS — I739 Peripheral vascular disease, unspecified: Secondary | ICD-10-CM

## 2015-11-25 DIAGNOSIS — Z79899 Other long term (current) drug therapy: Secondary | ICD-10-CM | POA: Diagnosis not present

## 2015-11-25 DIAGNOSIS — I5033 Acute on chronic diastolic (congestive) heart failure: Secondary | ICD-10-CM | POA: Insufficient documentation

## 2015-11-25 DIAGNOSIS — Z9889 Other specified postprocedural states: Secondary | ICD-10-CM | POA: Diagnosis not present

## 2015-11-25 DIAGNOSIS — E785 Hyperlipidemia, unspecified: Secondary | ICD-10-CM | POA: Insufficient documentation

## 2015-11-25 DIAGNOSIS — E1165 Type 2 diabetes mellitus with hyperglycemia: Secondary | ICD-10-CM

## 2015-11-25 LAB — BASIC METABOLIC PANEL
ANION GAP: 12 (ref 5–15)
BUN: 83 mg/dL — AB (ref 6–20)
CHLORIDE: 92 mmol/L — AB (ref 101–111)
CO2: 26 mmol/L (ref 22–32)
Calcium: 9.3 mg/dL (ref 8.9–10.3)
Creatinine, Ser: 1.78 mg/dL — ABNORMAL HIGH (ref 0.44–1.00)
GFR calc Af Amer: 33 mL/min — ABNORMAL LOW (ref >60)
GFR, EST NON AFRICAN AMERICAN: 28 mL/min — AB (ref >60)
Glucose, Bld: 264 mg/dL — ABNORMAL HIGH (ref 65–99)
POTASSIUM: 3.9 mmol/L (ref 3.5–5.1)
SODIUM: 130 mmol/L — AB (ref 135–145)

## 2015-11-25 LAB — BRAIN NATRIURETIC PEPTIDE: B NATRIURETIC PEPTIDE 5: 747.2 pg/mL — AB (ref 0.0–100.0)

## 2015-11-25 MED ORDER — TORSEMIDE 20 MG PO TABS: ORAL_TABLET | ORAL | 3 refills | Status: DC

## 2015-11-25 NOTE — Progress Notes (Signed)
PCP: Dr. Woody Seller Cardiology: Dr. Domenic Polite HF Cardiology: Dr. Aundra Dubin  70 yo with history of chronic diastolic CHF, CAD s/p CABG, chronic right pleural effusion, CKD stage III, and PAD presents for CHF clinic evaluation.  She has had multiple admissions with diastolic CHF complicated by CKD.  She was admitted in 7/17 for diuresis.  Worsening renal function was noted.  Lisinopril was stopped with elevated creatinine and Coreg was stopped with bradycardia.  Last echo in 7/17 showed EF 55-60% with grade II diastolic dysfunction and D-shaped interventricular septum.  She has been wearing home oxygen for > 1 year.    She was admitted again in 9/17 with volume overload.  She was diuresed extensively.  Pleurx catheter was removed from right chest.  RHC showed elevated right and left heart filling pressure with pulmonary venous hypertension.  She was diagnosed with methimazole and started on methimazole.   After discharge, torsemide was held for 3 days in 10/17 for PV procedure => right SFA and right tibioperoneal trunk angioplasties.  She still gets cramps in her right leg, but this is now better.    She presents today for a week follow up.  Weight down 2 lbs by our scales, but 10 lbs overall at home. Weight at home went from 205 -> 194 and holding.  Having a lot of back pain, especially with the cold weather.  Breathing is OK around the house, walks around 100-200 feet at a time.  Was SOB with bathing and changing clothes last week, but not this week. Also no longer orthopneic. No CP or lightheadedness   Labs (7/17): K 4.2, creatinine 2.04 Labs (10/17): K 4.5, creatinine 1.68, HCT 30.5  PMH: 1. Chronic diastolic CHF: Multiple admissions.  - Echo (7/17) with EF 55-60%, grade II diastolic dysfunction, D-shaped interventricular septum, mild MR, PASP 52 mmHg.  - Uses home oxygen.  - RHC (9/17): mean 15, PA 73/26 mean 42, mean PCWP 25, CI 2.84, PVR 3.0 WU => pulmonary venous hypertension.  2. H/o CVA 3.  Recurrent right pleural effusion: S/p Pleurx catheter placement.  4. Depression 5. HTN 6. Carotid stenosis: Bilateral CEAs 7. Type II diabetes.  8. CAD: NSTEMI 11/12, had CABG with LIMA-LAD, SVG-PLV, SVG-OM, SVG-D.   - LHC (9/15) with DES to SVG-D.  Other grafts patent.  9. PAD: Left femoral-popliteal bypass 9/15.  - peripheral arterial dopplers (2/17) with right ABI 0.53, left ABI 1.02.  - 10/17 right SFA atherectomy and right tibioperoneal trunk atherectomy.  10. CKD stage III. Lisinopril stopped with increased creatinine.  11. Bradycardia: Stopped Coreg.  12. Hyperthyroidism: Diagnosed in 9/17.   SH: Lives in [place] with son.  Prior smoker, quit 2012.  Retired.   Family History  Problem Relation Age of Onset  . Coronary artery disease Father     Died with MI age 82  . Heart disease Father     before age 75  . Heart attack Father   . Diabetes type II Mother   . Hypertension Mother   . Diabetes Mother   . Heart disease Mother   . Hyperlipidemia Mother   . Heart failure Sister   . Cancer Sister   . Cancer Brother     Lung cancer  . Diabetes Daughter   . Hyperlipidemia Daughter   . Diabetes Son   . Hyperlipidemia Son    ROS: All systems reviewed and negative except as per HPI.   Current Outpatient Prescriptions on File Prior to Encounter  Medication Sig Dispense  Refill  . acetaminophen (TYLENOL) 500 MG tablet Take 500-1,000 mg by mouth every 6 (six) hours as needed for mild pain or moderate pain.     [name] albuterol (PROVENTIL HFA;VENTOLIN HFA) 108 (90 BASE) MCG/ACT inhaler Inhale 2 puffs into the lungs daily as needed for wheezing or shortness of breath.     [name] amLODipine (NORVASC) 10 MG tablet TAKE ONE TABLET BY MOUTH ONCE DAILY 90 tablet 3  . aspirin 81 MG EC tablet Take 81 mg by mouth daily.    [name] atorvastatin (LIPITOR) 40 MG tablet Take 40 mg by mouth daily at 6 PM.    . citalopram (CELEXA) 40 MG tablet Take 40 mg by mouth every morning.     . clopidogrel (PLAVIX) 75 MG  tablet TAKE ONE TABLET BY MOUTH ONCE DAILY WITH BREAKFAST 30 tablet 6  . Docusate Calcium (STOOL SOFTENER PO) Take 200 mg by mouth at bedtime as needed (for constipation).     . gabapentin (NEURONTIN) 300 MG capsule Take 300-600 mg by mouth See admin instructions. 300 mg in the morning and 600 mg prior to bed(time)    . hydrALAZINE (APRESOLINE) 50 MG tablet Take 1.5 tablets (75 mg total) by mouth 3 (three) times daily. 100 tablet 3  . HYDROcodone-acetaminophen (NORCO) 7.5-325 MG tablet Take 1 tablet by mouth every 6 (six) hours as needed for moderate pain.    [name] insulin aspart (NOVOLOG) 100 UNIT/ML injection Inject 3 Units into the skin 3 (three) times daily with meals. (Patient taking differently: Inject 3-7 Units into the skin 3 (three) times daily after meals. Per sliding scale) 10 mL 11  . insulin detemir (LEVEMIR) 100 UNIT/ML injection Inject 7 Units into the skin at bedtime.    . isosorbide mononitrate (IMDUR) 30 MG 24 hr tablet TAKE ONE TABLET BY MOUTH ONCE DAILY 30 tablet 3  . methimazole (TAPAZOLE) 10 MG tablet Take 1 tablet (10 mg total) by mouth daily. 30 tablet 3  . metolazone (ZAROXOLYN) 2.5 MG tablet Take 1 tablet (2.5 mg total) by mouth as directed. 2 tablet 0  . Multiple Vitamin (MULTIVITAMIN WITH MINERALS) TABS tablet Take 1 tablet by mouth daily.    . nitroGLYCERIN (NITROSTAT) 0.4 MG SL tablet Place 1 tablet (0.4 mg total) under the tongue every 5 (five) minutes as needed for chest pain. Up to 3 doses. If no relief after 3rd dose, proceed to the ED for an evaluation 25 tablet 3  . spironolactone (ALDACTONE) 25 MG tablet Take 0.5 tablets (12.5 mg total) by mouth daily. 16 tablet 6  . torsemide (DEMADEX) 20 MG tablet Take 16m (4 Tabs) in the morning, take 413m(2 Tabs) in the evening 180 tablet 3  . umeclidinium-vilanterol (ANORO ELLIPTA) 62.5-25 MCG/INH AEPB Inhale 1 puff into the lungs daily.    . potassium chloride SA (K-DUR,KLOR-CON) 20 MEQ tablet Take 1 tablet (20 mEq total) by  mouth daily. (Patient not taking: Reported on 11/25/2015) 30 tablet 3   No current facility-administered medications on file prior to encounter.      BP 122/64 (BP Location: Left Arm, Patient Position: Sitting, Cuff Size: Normal)   Pulse 69   Wt 193 lb 3.2 oz (87.6 kg)   SpO2 90% Comment: on 4L  BMI 31.18 kg/m    Wt Readings from Last 3 Encounters:  11/25/15 193 lb 3.2 oz (87.6 kg)  11/15/15 195 lb (88.5 kg)  11/05/15 202 lb 6.1 oz (91.8 kg)    General: NAD Neck: JVP 9-10 with prominent  CV waves. no thyromegaly or thyroid nodule.  Lungs: Crackles at bases bilaterally.  CV: Nondisplaced PMI.  Heart regular S1/S2, no S3/S4, 2/6 SEM RUSB. .   No carotid bruit. Difficult to palpate pedal pulses.  Abdomen: Obese, soft, NT, ND, no HSM. No bruits or masses. +BS  Skin: Intact without lesions or rashes.  Neurologic: Alert and oriented x 3. Fine tremor noted.  Psych: Normal affect. Extremities: No clubbing.  1+ edema to BLE knees HEENT: Normal.   Assessment/plan: 1. Acute on chronic diastolic CHF: I suspect that there is a significant component of RV failure as well.  Weight is up again.  She is significantly volume overloaded on exam. Diuresis is complicated by CKD stage III, suspect cardiorenal syndrome.   - Continue torsemide 80 qam/40 qpm.  Add KCl 20 daily. - Reinforced importance of graded compression stockings during the day.   - Followup in 1 week in CHF clinic for reassessment, will need BMET.  2. CKD stage III:  - BMET today.  3. CAD: s/p CABG.  No chest pain.  H/o PCI to SVG-D in 9/15.  - Continue ASA 81, Plavix, statin.  4. Recurrent right pleural effusion: Pleurx catheter removed in 9/17. 5. Suspect OSA:  - Sleep study scheduled for end of December.   6. PAD: s/p recent right leg atherectomies.  Some improvement in cramping.  No sores on legs/feet.   7. Hyperlipidemia: Goal LDL < 70 - Stable at last check.   8. Hyperthyroidism: Has not seen endocrinology. Remains on  methimazole.  - Sees Dr Dorris Fetch tomorrow.    Volume status remains mildly elevated but improved with increased torsemide.  Down 10 lbs at home per [facility] RN and pt. Continue increased diuretics.  Can take additional 40 mg in evening for weight gain of 3 lbs over night or 5 lbs within one week.   Sees Dr Domenic Polite in 3 weeks.  Will follow up with Dr. Aundra Dubin 3 weeks after.   Shirley Friar, PA-C 11/25/2015 11:28 AM

## 2015-11-25 NOTE — Patient Instructions (Signed)
Labs today We will only contact you if something comes back abnormal or we need to make some changes. Otherwise no news is good news!  Please be sure to start potassium  Your physician recommends that you schedule a follow-up appointment in: 6-8 weeks with Dr Aundra Dubin  Do the following things EVERYDAY: 1) Weigh yourself in the morning before breakfast. Write it down and keep it in a log. 2) Take your medicines as prescribed 3) Eat low salt foods-Limit salt (sodium) to 2000 mg per day.  4) Stay as active as you can everyday 5) Limit all fluids for the day to less than 2 liters

## 2015-11-26 ENCOUNTER — Ambulatory Visit (INDEPENDENT_AMBULATORY_CARE_PROVIDER_SITE_OTHER): Payer: Medicare Other | Admitting: "Endocrinology

## 2015-11-26 ENCOUNTER — Encounter: Payer: Self-pay | Admitting: "Endocrinology

## 2015-11-26 VITALS — BP 117/55 | HR 55 | Ht 66.0 in | Wt 196.0 lb

## 2015-11-26 DIAGNOSIS — E061 Subacute thyroiditis: Secondary | ICD-10-CM | POA: Insufficient documentation

## 2015-11-26 DIAGNOSIS — I2510 Atherosclerotic heart disease of native coronary artery without angina pectoris: Secondary | ICD-10-CM

## 2015-11-26 DIAGNOSIS — E0590 Thyrotoxicosis, unspecified without thyrotoxic crisis or storm: Secondary | ICD-10-CM | POA: Diagnosis not present

## 2015-11-26 NOTE — Progress Notes (Signed)
Subjective:    Patient ID: [name] [name], female    DOB: [birth year], PCP Glenda Chroman, MD.   Past Medical History:  Diagnosis Date  . Arthritis   . Carotid artery disease (HCC)    a. Bilateral CEA; 50-60% bilateral ICA stenosis, 11/12  . Cellulitis    a. Recurrent, bilateral legs  . CHF (congestive heart failure) ([place])   . Chronic back pain   . Chronic diastolic heart failure ([place])   . Coronary atherosclerosis of native coronary artery    a. 11/2010 NSTEMI/CABG x 4: L-LAD; S-PL; S-OM; S-DX, by PVT.  [name] Critical lower limb ischemia   . Depression   . Essential hypertension, benign   . Herniated disc   . History of blood transfusion   . Ischemic cardiomyopathy    a. 11/2010 TEE: EF 40-45%.  . Migraine   . Mixed hyperlipidemia   . NSTEMI (non-ST elevated myocardial infarction) ([place]) 11/2010  . On home oxygen therapy   . PAD (peripheral artery disease) ([place])   . Pneumonia 2000's X 2  . Stroke ([place]) 08/2009  . Suicide attempt 08/2002  . TIA (transient ischemic attack)   . TMJ syndrome   . Type 2 diabetes mellitus ([place])   . Venous insufficiency    Past Surgical History:  Procedure Laterality Date  . ABDOMINAL HYSTERECTOMY    . APPENDECTOMY    . CARDIAC CATHETERIZATION  11/2010  . CARDIAC CATHETERIZATION N/A 09/16/2015   Procedure: Right Heart Cath;  Surgeon: Larey Dresser, MD;  Location: [place] [facility];  Service: Cardiovascular;  Laterality: N/A;  . CAROTID ENDARTERECTOMY Bilateral 2011   Bilateral - Dr. Kellie Simmering  . CHOLECYSTECTOMY    . COLONOSCOPY W/ BIOPSIES AND POLYPECTOMY    . CORONARY ANGIOPLASTY WITH STENT PLACEMENT  03/2013   "1"  . CORONARY ARTERY BYPASS GRAFT  11/24/2010   Procedure: CORONARY ARTERY BYPASS GRAFTING (CABG);  Surgeon: Tharon Aquas Adelene Idler, MD;  Location: [facility];  Service: Open Heart Surgery;  Laterality: N/A;  Coronary Artery Bypass Graft times four on pump utilizing left internal mammary artery and bilateral greater saphenous veins harvested  endoscopically, transesophageal echocardiogram   . DEBRIDEMENT TOE Left    "nonhealing wound; 3rd digit  . DILATION AND CURETTAGE OF UTERUS    . ENDARTERECTOMY POPLITEAL Left 10/07/2013   Procedure: LEFT POPLITEAL ENDARTERECTOMY ;  Surgeon: Angelia Mould, MD;  Location: [place];  Service: Vascular;  Laterality: Left;  . FEMORAL-POPLITEAL BYPASS GRAFT Left 10/07/2013   Procedure: LEFT FEMORAL-POPLITEAL ARTERY BYPASS GRAFT;  Surgeon: Angelia Mould, MD;  Location: [place];  Service: Vascular;  Laterality: Left;  . FRACTURE SURGERY    . INTRAOPERATIVE ARTERIOGRAM Left 10/07/2013   Procedure: INTRA OPERATIVE ARTERIOGRAM LEFT LEG;  Surgeon: Angelia Mould, MD;  Location: [place];  Service: Vascular;  Laterality: Left;  . IR GENERIC HISTORICAL  09/21/2015   IR REMOVAL OF PLURAL CATH W/CUFF 09/21/2015 Marybelle Killings, MD [facility]-INTERV RAD  . LEFT AND RIGHT HEART CATHETERIZATION WITH CORONARY/GRAFT ANGIOGRAM N/A 03/27/2013   Procedure: LEFT AND RIGHT HEART CATHETERIZATION WITH Beatrix Fetters;  Surgeon: Burnell Blanks, MD;  Location: [facility];  Service: Cardiovascular;  Laterality: N/A;  . LOWER EXTREMITY ANGIOGRAM  09/08/2012; 10/06/2013   "found 100% blockage; unsuccessful attempt at crossing a chronic total occlusion of the left SFA in the setting of critical limb ischemia  . LOWER EXTREMITY ANGIOGRAM Bilateral 09/08/2013   Procedure: LOWER EXTREMITY ANGIOGRAM;  Surgeon: Roderic Palau  Adora Fridge, MD;  Location: [place] CATH LAB;  Service: Cardiovascular;  Laterality: Bilateral;  . PERIPHERAL VASCULAR CATHETERIZATION  11/04/2015   Procedure: Peripheral Vascular Atherectomy;  Surgeon: [name], MD;  Location: [place] [facility];  Service: Cardiovascular;;  RT. SFA  . PERIPHERAL VASCULAR CATHETERIZATION Bilateral 11/04/2015   Procedure: Lower Extremity Intervention;  Surgeon: [name], MD;  Location: [place] [facility];  Service: Cardiovascular;  Laterality: Bilateral;  .  PERIPHERAL VASCULAR CATHETERIZATION  11/04/2015   Procedure: Peripheral Vascular Balloon Angioplasty;  Surgeon: [name], MD;  Location: [place] [facility];  Service: Cardiovascular;;  TP Trunk  . TOE SURGERY Left    "put pin in 2nd toe"  . TUBAL LIGATION    . WRIST FRACTURE SURGERY Left    "grafted bone from hip to wrist"   Social History   Social History  . Marital status: Widowed    Spouse name: N/A  . Number of children: N/A  . Years of education: N/A   Social History Main Topics  . Smoking status: Former Smoker    Packs/day: 3.00    Years: 50.00    Types: Cigarettes    Start date: 01/24/1956    Quit date: 10/09/2009  . Smokeless tobacco: Never Used  . Alcohol use 0.0 oz/week     Comment: 10/06/2013 "might have a drink q 2-3 months"  . Drug use:     Types: Marijuana     Comment: "smoked pot in my teens"  . Sexual activity: Not Currently   Other Topics Concern  . None   Social History Narrative   Lives in [place] by herself.  She does not work.   Outpatient Encounter Prescriptions as of 11/26/2015  Medication Sig  . potassium chloride SA (K-DUR,KLOR-CON) 20 MEQ tablet Take 1 tablet (20 mEq total) by mouth daily.  [name] acetaminophen (TYLENOL) 500 MG tablet Take 500-1,000 mg by mouth every 6 (six) hours as needed for mild pain or moderate pain.   [name] albuterol (PROVENTIL HFA;VENTOLIN HFA) 108 (90 BASE) MCG/ACT inhaler Inhale 2 puffs into the lungs daily as needed for wheezing or shortness of breath.   [name] amLODipine (NORVASC) 10 MG tablet TAKE ONE TABLET BY MOUTH ONCE DAILY  . aspirin 81 MG EC tablet Take 81 mg by mouth daily.  [name] atorvastatin (LIPITOR) 40 MG tablet Take 40 mg by mouth daily at 6 PM.  . citalopram (CELEXA) 40 MG tablet Take 40 mg by mouth every morning.   . clopidogrel (PLAVIX) 75 MG tablet TAKE ONE TABLET BY MOUTH ONCE DAILY WITH  BREAKFAST  . Docusate Calcium (STOOL SOFTENER PO) Take 200 mg by mouth at bedtime as needed (for constipation).   . gabapentin  (NEURONTIN) 300 MG capsule Take 300-600 mg by mouth See admin instructions. 300 mg in the morning and 600 mg prior to bed(time)  . hydrALAZINE (APRESOLINE) 50 MG tablet Take 1.5 tablets (75 mg total) by mouth 3 (three) times daily.  [name] HYDROcodone-acetaminophen (NORCO) 7.5-325 MG tablet Take 1 tablet by mouth every 6 (six) hours as needed for moderate pain.  [name] insulin aspart (NOVOLOG) 100 UNIT/ML injection Inject 3 Units into the skin 3 (three) times daily with meals. (Patient taking differently: Inject 3-7 Units into the skin 3 (three) times daily after meals. Per sliding scale)  . insulin detemir (LEVEMIR) 100 UNIT/ML injection Inject 7 Units into the skin at bedtime.  . isosorbide mononitrate (IMDUR) 30 MG 24 hr tablet TAKE ONE TABLET BY MOUTH  ONCE DAILY  . methimazole (TAPAZOLE) 10 MG tablet Take 1 tablet (10 mg total) by mouth daily.  . metolazone (ZAROXOLYN) 2.5 MG tablet Take 1 tablet (2.5 mg total) by mouth as directed.  . Multiple Vitamin (MULTIVITAMIN WITH MINERALS) TABS tablet Take 1 tablet by mouth daily.  . nitroGLYCERIN (NITROSTAT) 0.4 MG SL tablet Place 1 tablet (0.4 mg total) under the tongue every 5 (five) minutes as needed for chest pain. Up to 3 doses. If no relief after 3rd dose, proceed to the ED for an evaluation  . spironolactone (ALDACTONE) 25 MG tablet Take 0.5 tablets (12.5 mg total) by mouth daily.  [name] torsemide (DEMADEX) 20 MG tablet Take 95m (4 Tabs) in the morning, take 458m(2 Tabs) in the evening  . umeclidinium-vilanterol (ANORO ELLIPTA) 62.5-25 MCG/INH AEPB Inhale 1 puff into the lungs daily.   No facility-administered encounter medications on file as of 11/26/2015.    ALLERGIES: Allergies  Allergen Reactions  . Other Shortness Of Breath, Rash and Other (See Comments)    CANNOT EAT ANY BERRIES!!  . Strawberry Extract Hives, Shortness Of Breath and Rash  . Sulfa Antibiotics Swelling and Other (See Comments)    Bodily Swelling  . Coffee Bean Extract [Coffea  Arabica] Itching and Rash  . Tape Other (See Comments)    Tears skin.  Please use "paper" tape only.   VACCINATION STATUS: Immunization History  Administered Date(s) Administered  . Influenza,inj,Quad PF,36+ Mos 10/10/2013, 09/05/2014  . Pneumococcal Polysaccharide-23 03/21/2013, 09/05/2014    HPI  The patient presents today with a medical history as above, and is being seen in consultation for hyperthyroidism requested by Dr. VyWoody Seller The patient has been dealing with symptoms of  weight loss of 6 pounds over the last 2 months, tremors, and report disturbance for proximate a 6 month. These symptoms are progressively worsening and troubling to patient.  - She underwent thyroid function tests which showed hyperthyroidism and was initiated on methimazole 10 mg by mouth daily. She has taken this medication for at least 4 weeks time per her report. The patient reports family history of thyroid dysfunction  in one of her sisters and one of her daughters,  but the patient denies personal history of goiter. Patient is not on  any thyroid hormone supplements. Patient  is willing to proceed with appropriate work up and therapy for thyrotoxicosis.   Review of Systems Constitutional:  weight loss, + fatigue, +subjective hyperthermia Eyes: no blurry vision, +xerophthalmia ENT: no sore throat, no nodules palpated in throat, no dysphagia/odynophagia, no hoarseness Cardiovascular: no CP/SOB/palpitations/leg swelling Respiratory: + cough, +SOB Gastrointestinal: no N/V/D/C Musculoskeletal: no muscle/joint aches Skin: no rashes Neurological: +tremors, - numbness/tingling, + dizziness Psychiatric: +anxiety  Objective:    BP (!) 117/55   Pulse (!) 55   Ht _0  (1.676 m)   Wt 196 lb (88.9 kg)   BMI 31.64 kg/m   Wt Readings from Last 3 Encounters:  11/26/15 196 lb (88.9 kg)  11/25/15 193 lb 3.2 oz (87.6 kg)  11/15/15 195 lb (88.5 kg)    Physical Exam Constitutional: overweight, in NAD, she uses  oxygen per nasal cannula Eyes: PERRLA, EOMI, + exophthalmos with lid lag ENT: moist mucous membranes, no thyromegaly, no cervical lymphadenopathy Cardiovascular: Distant heart sounds midline sternotomy scar from prior coronary artery bypass graft Respiratory: Tight  chest was poor air entry. Gastrointestinal: abdomen soft, NT, ND, BS+ Musculoskeletal: no deformities, strength intact in all 4 Skin: moist, warm, no rashes Neurological: + tremor  with outstretched hands, DTR normal in all 4  Results for orders placed or performed during the hospital encounter of 03/75/43  Basic metabolic panel  Result Value Ref Range   Sodium 130 (L) 135 - 145 mmol/L   Potassium 3.9 3.5 - 5.1 mmol/L   Chloride 92 (L) 101 - 111 mmol/L   CO2 26 22 - 32 mmol/L   Glucose, Bld 264 (H) 65 - 99 mg/dL   BUN 83 (H) 6 - 20 mg/dL   Creatinine, Ser 1.78 (H) 0.44 - 1.00 mg/dL   Calcium 9.3 8.9 - 10.3 mg/dL   GFR calc non Af Amer 28 (L) >60 mL/min   GFR calc Af Amer 33 (L) >60 mL/min   Anion gap 12 5 - 15  Brain natriuretic peptide  Result Value Ref Range   B Natriuretic Peptide 747.2 (H) 0.0 - 100.0 pg/mL   Complete Blood Count (Most recent): Lab Results  Component Value Date   WBC 6.0 11/05/2015   HGB 9.3 (L) 11/05/2015   HCT 30.5 (L) 11/05/2015   MCV 84.3 11/05/2015   PLT 233 11/05/2015   Chemistry (most recent): Lab Results  Component Value Date   NA 130 (L) 11/25/2015   K 3.9 11/25/2015   CL 92 (L) 11/25/2015   CO2 26 11/25/2015   BUN 83 (H) 11/25/2015   CREATININE 1.78 (H) 11/25/2015   Diabetic Labs (most recent): Lab Results  Component Value Date   HGBA1C 6.4 (H) 09/14/2015   HGBA1C 10.8 (H) 03/18/2013   HGBA1C 8.1 (H) 11/22/2010   Lipid profile (most recent): Lab Results  Component Value Date   TRIG 102 11/15/2015   CHOL 78 11/15/2015    Results for NAJAE, FILSAIME (MRN [id number]) as of 11/26/2015 10:31  Ref. Range 09/14/2015 16:54 09/15/2015 09:27  TSH Latest Ref Range: 0.350 - 4.500  uIU/mL 0.087 (L) 0.103 (L)  Triiodothyronine,Free,Serum Latest Ref Range: 2.0 - 4.4 pg/mL  3.1  T4,Free(Direct) Latest Ref Range: 0.61 - 1.12 ng/dL  2.33 (H)     Assessment & Plan:   1. Hyperthyroidism  Patient's history and most recent labs are reviewed. 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 The potential risks of untreated thyrotoxicosis and the need for definitive therapy have been discussed in detail with the patient And her grown daughter in the room, and the patient agrees to proceed with plan.   - Given her serious comorbidities, the best choice of therapy will be ablative therapy with radioactive iodine instead of therapy with methimazole.  This will require confirmatory thyroid uptake and scan. - She will stop methimazole for a week prior to performing the scan. - If the scan confirms primary hyperthyroidism, therapy with RAI ablation of the thyroid will be administered. She understands the subsequent need for lifelong thyroid hormone replacement.  - 40 minutes of time was spent on the care of this patient , 50% of which was applied for counseling on complications of thyrotoxicosis, options ,  and the need for definitive therapy to prevent these complications. - She has well controlled type 2 diabetes with A1c of 6.4% following with her primary care doctor. - I advised patient to maintain close follow up with Glenda Chroman, MD for primary care needs.  Follow up plan: Return in about 2 weeks (around 12/10/2015) for thyroid uptake and scan after 12/03/2015. Pt is stopping methimazole today.Glade Lloyd, MD Phone: [phone]  Fax: [phone]   11/26/2015, 10:26 AM

## 2015-11-29 DIAGNOSIS — E1165 Type 2 diabetes mellitus with hyperglycemia: Secondary | ICD-10-CM | POA: Diagnosis not present

## 2015-11-29 DIAGNOSIS — I130 Hypertensive heart and chronic kidney disease with heart failure and stage 1 through stage 4 chronic kidney disease, or unspecified chronic kidney disease: Secondary | ICD-10-CM | POA: Diagnosis not present

## 2015-11-29 DIAGNOSIS — J90 Pleural effusion, not elsewhere classified: Secondary | ICD-10-CM | POA: Diagnosis not present

## 2015-11-29 DIAGNOSIS — N183 Chronic kidney disease, stage 3 (moderate): Secondary | ICD-10-CM | POA: Diagnosis not present

## 2015-11-29 DIAGNOSIS — I5033 Acute on chronic diastolic (congestive) heart failure: Secondary | ICD-10-CM | POA: Diagnosis not present

## 2015-11-29 DIAGNOSIS — E1122 Type 2 diabetes mellitus with diabetic chronic kidney disease: Secondary | ICD-10-CM | POA: Diagnosis not present

## 2015-11-30 ENCOUNTER — Telehealth: Payer: Self-pay

## 2015-11-30 NOTE — Telephone Encounter (Signed)
Notified pt of Thyroid uptake/scan appt at [facility] on 12/07/15 & 12/08/15 at 1:00. Also notified pt to be NPO 4 hrs prior to 12/07/15 appt.

## 2015-12-01 ENCOUNTER — Other Ambulatory Visit: Payer: Self-pay | Admitting: Cardiovascular Disease

## 2015-12-01 DIAGNOSIS — Z9981 Dependence on supplemental oxygen: Secondary | ICD-10-CM | POA: Diagnosis not present

## 2015-12-01 DIAGNOSIS — I2510 Atherosclerotic heart disease of native coronary artery without angina pectoris: Secondary | ICD-10-CM | POA: Diagnosis not present

## 2015-12-01 DIAGNOSIS — E1151 Type 2 diabetes mellitus with diabetic peripheral angiopathy without gangrene: Secondary | ICD-10-CM | POA: Diagnosis not present

## 2015-12-01 DIAGNOSIS — I5033 Acute on chronic diastolic (congestive) heart failure: Secondary | ICD-10-CM | POA: Diagnosis not present

## 2015-12-01 DIAGNOSIS — Z794 Long term (current) use of insulin: Secondary | ICD-10-CM | POA: Diagnosis not present

## 2015-12-01 DIAGNOSIS — E1122 Type 2 diabetes mellitus with diabetic chronic kidney disease: Secondary | ICD-10-CM | POA: Diagnosis not present

## 2015-12-01 DIAGNOSIS — E1165 Type 2 diabetes mellitus with hyperglycemia: Secondary | ICD-10-CM | POA: Diagnosis not present

## 2015-12-01 DIAGNOSIS — J90 Pleural effusion, not elsewhere classified: Secondary | ICD-10-CM | POA: Diagnosis not present

## 2015-12-01 DIAGNOSIS — N183 Chronic kidney disease, stage 3 (moderate): Secondary | ICD-10-CM | POA: Diagnosis not present

## 2015-12-01 DIAGNOSIS — E782 Mixed hyperlipidemia: Secondary | ICD-10-CM | POA: Diagnosis not present

## 2015-12-01 DIAGNOSIS — J9621 Acute and chronic respiratory failure with hypoxia: Secondary | ICD-10-CM | POA: Diagnosis not present

## 2015-12-01 DIAGNOSIS — I130 Hypertensive heart and chronic kidney disease with heart failure and stage 1 through stage 4 chronic kidney disease, or unspecified chronic kidney disease: Secondary | ICD-10-CM | POA: Diagnosis not present

## 2015-12-01 DIAGNOSIS — Z8673 Personal history of transient ischemic attack (TIA), and cerebral infarction without residual deficits: Secondary | ICD-10-CM | POA: Diagnosis not present

## 2015-12-01 DIAGNOSIS — Z951 Presence of aortocoronary bypass graft: Secondary | ICD-10-CM | POA: Diagnosis not present

## 2015-12-01 DIAGNOSIS — Z87891 Personal history of nicotine dependence: Secondary | ICD-10-CM | POA: Diagnosis not present

## 2015-12-01 DIAGNOSIS — Z7902 Long term (current) use of antithrombotics/antiplatelets: Secondary | ICD-10-CM | POA: Diagnosis not present

## 2015-12-01 DIAGNOSIS — I739 Peripheral vascular disease, unspecified: Secondary | ICD-10-CM

## 2015-12-01 DIAGNOSIS — Z915 Personal history of self-harm: Secondary | ICD-10-CM | POA: Diagnosis not present

## 2015-12-07 ENCOUNTER — Encounter (HOSPITAL_COMMUNITY): Payer: Medicare Other

## 2015-12-07 DIAGNOSIS — N183 Chronic kidney disease, stage 3 (moderate): Secondary | ICD-10-CM | POA: Diagnosis not present

## 2015-12-07 DIAGNOSIS — E1122 Type 2 diabetes mellitus with diabetic chronic kidney disease: Secondary | ICD-10-CM | POA: Diagnosis not present

## 2015-12-07 DIAGNOSIS — J90 Pleural effusion, not elsewhere classified: Secondary | ICD-10-CM | POA: Diagnosis not present

## 2015-12-07 DIAGNOSIS — I5033 Acute on chronic diastolic (congestive) heart failure: Secondary | ICD-10-CM | POA: Diagnosis not present

## 2015-12-07 DIAGNOSIS — I130 Hypertensive heart and chronic kidney disease with heart failure and stage 1 through stage 4 chronic kidney disease, or unspecified chronic kidney disease: Secondary | ICD-10-CM | POA: Diagnosis not present

## 2015-12-07 DIAGNOSIS — E0590 Thyrotoxicosis, unspecified without thyrotoxic crisis or storm: Secondary | ICD-10-CM | POA: Insufficient documentation

## 2015-12-07 DIAGNOSIS — E1165 Type 2 diabetes mellitus with hyperglycemia: Secondary | ICD-10-CM | POA: Diagnosis not present

## 2015-12-08 ENCOUNTER — Encounter (HOSPITAL_COMMUNITY): Payer: Self-pay

## 2015-12-08 ENCOUNTER — Encounter (HOSPITAL_COMMUNITY): Payer: Medicare Other

## 2015-12-08 ENCOUNTER — Encounter (HOSPITAL_COMMUNITY)
Admission: RE | Admit: 2015-12-08 | Discharge: 2015-12-08 | Disposition: A | Discharge status: Home / Self Care | Payer: Medicare Other | Source: Ambulatory Visit | Attending: "Endocrinology | Admitting: "Endocrinology

## 2015-12-08 DIAGNOSIS — E0590 Thyrotoxicosis, unspecified without thyrotoxic crisis or storm: Secondary | ICD-10-CM

## 2015-12-08 MED ORDER — SODIUM IODIDE I-123 7.4 MBQ CAPS
378.0000 | ORAL_CAPSULE | Freq: Once | ORAL | Status: AC
  Administered 2015-12-08: 378 via ORAL

## 2015-12-09 ENCOUNTER — Encounter (HOSPITAL_COMMUNITY): Payer: Medicare Other

## 2015-12-09 ENCOUNTER — Encounter (HOSPITAL_COMMUNITY)
Admission: RE | Admit: 2015-12-09 | Discharge: 2015-12-09 | Disposition: A | Discharge status: Home / Self Care | Payer: Medicare Other | Source: Ambulatory Visit | Attending: "Endocrinology | Admitting: "Endocrinology

## 2015-12-09 DIAGNOSIS — J90 Pleural effusion, not elsewhere classified: Secondary | ICD-10-CM | POA: Diagnosis not present

## 2015-12-09 DIAGNOSIS — E1122 Type 2 diabetes mellitus with diabetic chronic kidney disease: Secondary | ICD-10-CM | POA: Diagnosis not present

## 2015-12-09 DIAGNOSIS — I130 Hypertensive heart and chronic kidney disease with heart failure and stage 1 through stage 4 chronic kidney disease, or unspecified chronic kidney disease: Secondary | ICD-10-CM | POA: Diagnosis not present

## 2015-12-09 DIAGNOSIS — E0590 Thyrotoxicosis, unspecified without thyrotoxic crisis or storm: Secondary | ICD-10-CM | POA: Diagnosis not present

## 2015-12-09 DIAGNOSIS — E1165 Type 2 diabetes mellitus with hyperglycemia: Secondary | ICD-10-CM | POA: Diagnosis not present

## 2015-12-09 DIAGNOSIS — I5033 Acute on chronic diastolic (congestive) heart failure: Secondary | ICD-10-CM | POA: Diagnosis not present

## 2015-12-09 DIAGNOSIS — N183 Chronic kidney disease, stage 3 (moderate): Secondary | ICD-10-CM | POA: Diagnosis not present

## 2015-12-10 ENCOUNTER — Ambulatory Visit (INDEPENDENT_AMBULATORY_CARE_PROVIDER_SITE_OTHER): Payer: Medicare Other | Admitting: "Endocrinology

## 2015-12-10 ENCOUNTER — Encounter: Payer: Self-pay | Admitting: "Endocrinology

## 2015-12-10 VITALS — BP 98/58 | HR 51 | Ht 66.0 in | Wt 197.0 lb

## 2015-12-10 DIAGNOSIS — E061 Subacute thyroiditis: Secondary | ICD-10-CM

## 2015-12-10 DIAGNOSIS — I2510 Atherosclerotic heart disease of native coronary artery without angina pectoris: Secondary | ICD-10-CM

## 2015-12-10 NOTE — Progress Notes (Signed)
Subjective:    Patient ID: [name] [name], female    DOB: 1945/07/07, PCP Glenda Chroman, MD.   Past Medical History:  Diagnosis Date  . Arthritis   . Carotid artery disease (HCC)    a. Bilateral CEA; 50-60% bilateral ICA stenosis, 11/12  . Cellulitis    a. Recurrent, bilateral legs  . CHF (congestive heart failure) ([place])   . Chronic back pain   . Chronic diastolic heart failure ([place])   . Coronary atherosclerosis of native coronary artery    a. 11/2010 NSTEMI/CABG x 4: L-LAD; S-PL; S-OM; S-DX, by PVT.  [name] Critical lower limb ischemia   . Depression   . Essential hypertension, benign   . Herniated disc   . History of blood transfusion   . Ischemic cardiomyopathy    a. 11/2010 TEE: EF 40-45%.  . Migraine   . Mixed hyperlipidemia   . NSTEMI (non-ST elevated myocardial infarction) ([place]) 11/2010  . On home oxygen therapy   . PAD (peripheral artery disease) ([place])   . Pneumonia 2000's X 2  . Stroke ([place]) 08/2009  . Suicide attempt 08/2002  . TIA (transient ischemic attack)   . TMJ syndrome   . Type 2 diabetes mellitus ([place])   . Venous insufficiency    Past Surgical History:  Procedure Laterality Date  . ABDOMINAL HYSTERECTOMY    . APPENDECTOMY    . CARDIAC CATHETERIZATION  11/2010  . CARDIAC CATHETERIZATION N/A 09/16/2015   Procedure: Right Heart Cath;  Surgeon: Larey Dresser, MD;  Location: [facility];  Service: Cardiovascular;  Laterality: N/A;  . CAROTID ENDARTERECTOMY Bilateral 2011   Bilateral - Dr. Kellie Simmering  . CHOLECYSTECTOMY    . COLONOSCOPY W/ BIOPSIES AND POLYPECTOMY    . CORONARY ANGIOPLASTY WITH STENT PLACEMENT  03/2013   "1"  . CORONARY ARTERY BYPASS GRAFT  11/24/2010   Procedure: CORONARY ARTERY BYPASS GRAFTING (CABG);  Surgeon: Tharon Aquas Adelene Idler, MD;  Location: [facility];  Service: Open Heart Surgery;  Laterality: N/A;  Coronary Artery Bypass Graft times four on pump utilizing left internal mammary artery and bilateral greater saphenous veins harvested  endoscopically, transesophageal echocardiogram   . DEBRIDEMENT TOE Left    "nonhealing wound; 3rd digit  . DILATION AND CURETTAGE OF UTERUS    . ENDARTERECTOMY POPLITEAL Left 10/07/2013   Procedure: LEFT POPLITEAL ENDARTERECTOMY ;  Surgeon: Angelia Mould, MD;  Location: [facility];  Service: Vascular;  Laterality: Left;  . FEMORAL-POPLITEAL BYPASS GRAFT Left 10/07/2013   Procedure: LEFT FEMORAL-POPLITEAL ARTERY BYPASS GRAFT;  Surgeon: Angelia Mould, MD;  Location: [facility];  Service: Vascular;  Laterality: Left;  . FRACTURE SURGERY    . INTRAOPERATIVE ARTERIOGRAM Left 10/07/2013   Procedure: INTRA OPERATIVE ARTERIOGRAM LEFT LEG;  Surgeon: Angelia Mould, MD;  Location: [facility];  Service: Vascular;  Laterality: Left;  . IR GENERIC HISTORICAL  09/21/2015   IR REMOVAL OF PLURAL CATH W/CUFF 09/21/2015 Marybelle Killings, MD [facility]-INTER[facility] RAD  . LEFT AND RIGHT HEART CATHETERIZATION WITH CORONARY/GRAFT ANGIOGRAM N/A 03/27/2013   Procedure: LEFT AND RIGHT HEART CATHETERIZATION WITH Beatrix Fetters;  Surgeon: Burnell Blanks, MD;  Location: [facility] C[facility];  Service: Cardiovascular;  Laterality: N/A;  . LOWER EXTREMITY ANGIOGRAM  09/08/2012; 10/06/2013   "found 100% blockage; unsuccessful attempt at crossing a chronic total occlusion of the left SFA in the setting of critical limb ischemia  . LOWER EXTREMITY ANGIOGRAM Bilateral 09/08/2013   Procedure: LOWER EXTREMITY ANGIOGRAM;  Surgeon: Roderic Palau  Adora Fridge, MD;  Location: [facility] C[facility];  Service: Cardiovascular;  Laterality: Bilateral;  . PERIPHERAL VASCULAR CATHETERIZATION  11/04/2015   Procedure: Peripheral Vascular Atherectomy;  Surgeon: [name], MD;  Location: [facility];  Service: Cardiovascular;;  RT. SFA  . PERIPHERAL VASCULAR CATHETERIZATION Bilateral 11/04/2015   Procedure: Lower Extremity Intervention;  Surgeon: [name], MD;  Location: [facility];  Service: Cardiovascular;  Laterality: Bilateral;  .  PERIPHERAL VASCULAR CATHETERIZATION  11/04/2015   Procedure: Peripheral Vascular Balloon Angioplasty;  Surgeon: [name], MD;  Location: [facility];  Service: Cardiovascular;;  TP Trunk  . TOE SURGERY Left    "put pin in 2nd toe"  . TUBAL LIGATION    . WRIST FRACTURE SURGERY Left    "grafted bone from hip to wrist"   Social History   Social History  . Marital status: Widowed    Spouse name: N/A  . Number of children: N/A  . Years of education: N/A   Social History Main Topics  . Smoking status: Former Smoker    Packs/day: 3.00    Years: 50.00    Types: Cigarettes    Start date: 01/24/1956    Quit date: 10/09/2009  . Smokeless tobacco: Never Used  . Alcohol use 0.0 oz/week     Comment: 10/06/2013 "might have a drink q 2-3 months"  . Drug use:     Types: Marijuana     Comment: "smoked pot in my teens"  . Sexual activity: Not Currently   Other Topics Concern  . Not on file   Social History Narrative   Lives in [place] by herself.  She does not work.   Outpatient Encounter Prescriptions as of 12/10/2015  Medication Sig  . acetaminophen (TYLENOL) 500 MG tablet Take 500-1,000 mg by mouth every 6 (six) hours as needed for mild pain or moderate pain.   [name] albuterol (PROVENTIL HFA;VENTOLIN HFA) 108 (90 BASE) MCG/ACT inhaler Inhale 2 puffs into the lungs daily as needed for wheezing or shortness of breath.   [name] amLODipine (NORVASC) 10 MG tablet TAKE ONE TABLET BY MOUTH ONCE DAILY  . aspirin 81 MG EC tablet Take 81 mg by mouth daily.  [name] atorvastatin (LIPITOR) 40 MG tablet Take 40 mg by mouth daily at 6 PM.  . citalopram (CELEXA) 40 MG tablet Take 40 mg by mouth every morning.   . clopidogrel (PLAVIX) 75 MG tablet TAKE ONE TABLET BY MOUTH ONCE DAILY WITH  BREAKFAST  . Docusate Calcium (STOOL SOFTENER PO) Take 200 mg by mouth at bedtime as needed (for constipation).   . gabapentin (NEURONTIN) 300 MG capsule Take 300-600 mg by mouth See admin instructions. 300 mg in the  morning and 600 mg prior to bed(time)  . hydrALAZINE (APRESOLINE) 50 MG tablet Take 1.5 tablets (75 mg total) by mouth 3 (three) times daily.  [name] HYDROcodone-acetaminophen (NORCO) 7.5-325 MG tablet Take 1 tablet by mouth every 6 (six) hours as needed for moderate pain.  [name] insulin aspart (NOVOLOG) 100 UNIT/ML injection Inject 3 Units into the skin 3 (three) times daily with meals. (Patient taking differently: Inject 3-7 Units into the skin 3 (three) times daily after meals. Per sliding scale)  . insulin detemir (LEVEMIR) 100 UNIT/ML injection Inject 7 Units into the skin at bedtime.  . isosorbide mononitrate (IMDUR) 30 MG 24 hr tablet TAKE ONE TABLET BY MOUTH ONCE DAILY  . metolazone (ZAROXOLYN) 2.5 MG tablet Take 1 tablet (2.5 mg total) by  mouth as directed.  . Multiple Vitamin (MULTIVITAMIN WITH MINERALS) TABS tablet Take 1 tablet by mouth daily.  . nitroGLYCERIN (NITROSTAT) 0.4 MG SL tablet Place 1 tablet (0.4 mg total) under the tongue every 5 (five) minutes as needed for chest pain. Up to 3 doses. If no relief after 3rd dose, proceed to the ED for an evaluation  . potassium chloride SA (K-DUR,KLOR-CON) 20 MEQ tablet Take 1 tablet (20 mEq total) by mouth daily.  [name] spironolactone (ALDACTONE) 25 MG tablet Take 0.5 tablets (12.5 mg total) by mouth daily.  [name] torsemide (DEMADEX) 20 MG tablet Take 38m (4 Tabs) in the morning, take 440m(2 Tabs) in the evening  . umeclidinium-vilanterol (ANORO ELLIPTA) 62.5-25 MCG/INH AEPB Inhale 1 puff into the lungs daily.   No facility-administered encounter medications on file as of 12/10/2015.    ALLERGIES: Allergies  Allergen Reactions  . Other Shortness Of Breath, Rash and Other (See Comments)    CANNOT EAT ANY BERRIES!!  . Strawberry Extract Hives, Shortness Of Breath and Rash  . Sulfa Antibiotics Swelling and Other (See Comments)    Bodily Swelling  . Coffee Bean Extract [Coffea Arabica] Itching and Rash  . Tape Other (See Comments)    Tears skin.   Please use "paper" tape only.   VACCINATION STATUS: Immunization History  Administered Date(s) Administered  . Influenza,inj,Quad PF,36+ Mos 10/10/2013, 09/05/2014  . Pneumococcal Polysaccharide-23 03/21/2013, 09/05/2014    HPI  The patient presents today with a medical history as above, and is being seen in f/u for hyperthyroidism requested by Dr. VyWoody Seller The patient has been dealing with symptoms of  weight loss of 6 pounds over the last 2 months.   - She underwent thyroid function tests which showed hyperthyroidism and was initiated on methimazole 10 mg by mouth daily. She has taken this medication for at least 4 weeks time per her report. - On her last visit with me we stopped methimazole and obtained thyroid uptake and scan. The scan showed only 1.2% uptake. The patient reports family history of thyroid dysfunction  in one of her sisters and one of her daughters,  but the patient denies personal history of goiter.   Review of Systems Constitutional:  weight loss, + fatigue, - hyperthermia Eyes: no blurry vision, +xerophthalmia ENT: no sore throat, no nodules palpated in throat, no dysphagia/odynophagia, no hoarseness Cardiovascular: no CP/SOB/palpitations/leg swelling Respiratory: + cough, +SOB Gastrointestinal: no N/V/D/C Musculoskeletal: no muscle/joint aches Skin: no rashes Neurological: +tremors, - numbness/tingling, + dizziness Psychiatric: +anxiety  Objective:    BP (!) 98/58   Pulse (!) 51   Ht 5' 6" (1.676 m)   Wt 197 lb (89.4 kg)   BMI 31.80 kg/m   Wt Readings from Last 3 Encounters:  12/10/15 197 lb (89.4 kg)  11/26/15 196 lb (88.9 kg)  11/25/15 193 lb 3.2 oz (87.6 kg)    Physical Exam Constitutional: overweight, in NAD, she uses oxygen per nasal cannula Eyes: PERRLA, EOMI,  ENT: moist mucous membranes, no thyromegaly, no cervical lymphadenopathy Cardiovascular: Distant heart sounds midline sternotomy scar from prior coronary artery bypass  graft Respiratory: Tight  chest was poor air entry. Gastrointestinal: abdomen soft, NT, ND, BS+ Musculoskeletal: no deformities, strength intact in all 4 Skin: moist, warm, no rashes Neurological: + tremor with outstretched hands, DTR normal in all 4  Results for orders placed or performed during the hospital encounter of 1143/32/95Basic metabolic panel  Result Value Ref Range   Sodium 130 (L) 135 -  145 mmol/L   Potassium 3.9 3.5 - 5.1 mmol/L   Chloride 92 (L) 101 - 111 mmol/L   CO2 26 22 - 32 mmol/L   Glucose, Bld 264 (H) 65 - 99 mg/dL   BUN 83 (H) 6 - 20 mg/dL   Creatinine, Ser 1.78 (H) 0.44 - 1.00 mg/dL   Calcium 9.3 8.9 - 10.3 mg/dL   GFR calc non Af Amer 28 (L) >60 mL/min   GFR calc Af Amer 33 (L) >60 mL/min   Anion gap 12 5 - 15  Brain natriuretic peptide  Result Value Ref Range   B Natriuretic Peptide 747.2 (H) 0.0 - 100.0 pg/mL   Complete Blood Count (Most recent): Lab Results  Component Value Date   WBC 6.0 11/05/2015   HGB 9.3 (L) 11/05/2015   HCT 30.5 (L) 11/05/2015   MCV 84.3 11/05/2015   PLT 233 11/05/2015   Chemistry (most recent): Lab Results  Component Value Date   NA 130 (L) 11/25/2015   K 3.9 11/25/2015   CL 92 (L) 11/25/2015   CO2 26 11/25/2015   BUN 83 (H) 11/25/2015   CREATININE 1.78 (H) 11/25/2015   Diabetic Labs (most recent): Lab Results  Component Value Date   HGBA1C 6.4 (H) 09/14/2015   HGBA1C 10.8 (H) 03/18/2013   HGBA1C 8.1 (H) 11/22/2010   Lipid profile (most recent): Lab Results  Component Value Date   TRIG 102 11/15/2015   CHOL 78 11/15/2015    Results for ASHLAN, DIGNAN (MRN [id number]) as of 11/26/2015 10:31  Ref. Range 09/14/2015 16:54 09/15/2015 09:27  TSH Latest Ref Range: 0.350 - 4.500 uIU/mL 0.087 (L) 0.103 (L)  Triiodothyronine,Free,Serum Latest Ref Range: 2.0 - 4.4 pg/mL  3.1  T4,Free(Direct) Latest Ref Range: 0.61 - 1.12 ng/dL  2.33 (H)     Assessment & Plan:   1. Subacute thyroiditis: - Her thyroid uptake and  scan did not agree with her thyroid function tests. The scan is showing only 1.2% uptake. This is suggestive of subacute thyroiditis, with possible transient thyrotoxicosis during her initial presentation. - This may resolve spontaneously or progress to clinical hypothyroidism. On rare occasions she may continue to have hyperthyroidism. -  She will not need therapy for hyperthyroidism. - She will need repeat thyroid function test today and revisit in 2 weeks. - She has well controlled type 2 diabetes with A1c of 6.4% following with her primary care doctor. - I advised patient to maintain close follow up with Glenda Chroman, MD for primary care needs.  Follow up plan: Return in about 2 weeks (around 12/24/2015) for follow up with pre-visit labs.  Glade Lloyd, MD Phone: [phone]  Fax: [phone]   12/10/2015, 10:49 AM

## 2015-12-14 DIAGNOSIS — Z713 Dietary counseling and surveillance: Secondary | ICD-10-CM | POA: Diagnosis not present

## 2015-12-14 DIAGNOSIS — E1122 Type 2 diabetes mellitus with diabetic chronic kidney disease: Secondary | ICD-10-CM | POA: Diagnosis not present

## 2015-12-14 DIAGNOSIS — M5440 Lumbago with sciatica, unspecified side: Secondary | ICD-10-CM | POA: Diagnosis not present

## 2015-12-14 DIAGNOSIS — I509 Heart failure, unspecified: Secondary | ICD-10-CM | POA: Diagnosis not present

## 2015-12-14 DIAGNOSIS — I5033 Acute on chronic diastolic (congestive) heart failure: Secondary | ICD-10-CM | POA: Diagnosis not present

## 2015-12-14 DIAGNOSIS — Z6832 Body mass index (BMI) 32.0-32.9, adult: Secondary | ICD-10-CM | POA: Diagnosis not present

## 2015-12-14 DIAGNOSIS — I130 Hypertensive heart and chronic kidney disease with heart failure and stage 1 through stage 4 chronic kidney disease, or unspecified chronic kidney disease: Secondary | ICD-10-CM | POA: Diagnosis not present

## 2015-12-14 DIAGNOSIS — E1165 Type 2 diabetes mellitus with hyperglycemia: Secondary | ICD-10-CM | POA: Diagnosis not present

## 2015-12-14 DIAGNOSIS — J90 Pleural effusion, not elsewhere classified: Secondary | ICD-10-CM | POA: Diagnosis not present

## 2015-12-14 DIAGNOSIS — N183 Chronic kidney disease, stage 3 (moderate): Secondary | ICD-10-CM | POA: Diagnosis not present

## 2015-12-14 DIAGNOSIS — Z299 Encounter for prophylactic measures, unspecified: Secondary | ICD-10-CM | POA: Diagnosis not present

## 2015-12-15 DIAGNOSIS — E061 Subacute thyroiditis: Secondary | ICD-10-CM | POA: Diagnosis not present

## 2015-12-16 DIAGNOSIS — N183 Chronic kidney disease, stage 3 (moderate): Secondary | ICD-10-CM | POA: Diagnosis not present

## 2015-12-16 DIAGNOSIS — J90 Pleural effusion, not elsewhere classified: Secondary | ICD-10-CM | POA: Diagnosis not present

## 2015-12-16 DIAGNOSIS — E1122 Type 2 diabetes mellitus with diabetic chronic kidney disease: Secondary | ICD-10-CM | POA: Diagnosis not present

## 2015-12-16 DIAGNOSIS — E1165 Type 2 diabetes mellitus with hyperglycemia: Secondary | ICD-10-CM | POA: Diagnosis not present

## 2015-12-16 DIAGNOSIS — I130 Hypertensive heart and chronic kidney disease with heart failure and stage 1 through stage 4 chronic kidney disease, or unspecified chronic kidney disease: Secondary | ICD-10-CM | POA: Diagnosis not present

## 2015-12-16 DIAGNOSIS — I5033 Acute on chronic diastolic (congestive) heart failure: Secondary | ICD-10-CM | POA: Diagnosis not present

## 2015-12-16 LAB — TSH: TSH: 0.02 m[IU]/L — AB

## 2015-12-16 LAB — T4, FREE: FREE T4: 1.7 ng/dL (ref 0.8–1.8)

## 2015-12-16 LAB — THYROID PEROXIDASE ANTIBODY: THYROID PEROXIDASE ANTIBODY: 1 [IU]/mL (ref <9)

## 2015-12-16 LAB — THYROGLOBULIN ANTIBODY

## 2015-12-19 NOTE — Progress Notes (Signed)
Cardiology Office Note  Date: 12/20/2015   ID: [name] [name], DOB [birth year], MRN [id number]  PCP: Glenda Chroman, MD  Primary Cardiologist: Rozann Lesches, MD   Chief Complaint  Patient presents with  . Diastolic heart failure  . Coronary Artery Disease    History of Present Illness: [name] [name] is a 70 y.o. female last seen in October. She continues with interval follow-up in the CHF clinic. She on Demadex 80 mg AM and 40 mg PM daily with additional 40 mg evening dose as needed for weight gain. BUN and creatinine have slowly crept up over time.She is here today with her daughter. Weight has gone down significantly, now to 181 pounds today. Reports stable appetite. Has not taken any additional Demadex. Also undergoing evaluation by Dr. Dorris Fetch for suspected subacute thyroiditis.  We went over her medications which are stable. Most recent BUN and creatinine were 83 and 1.8 respectively.  Most recent echocardiogram from July showed LVEF 55-60% with grade 2 diastolic dysfunction and increased filling pressures.  Fortunately, she does not report any angina symptoms.  Past Medical History:  Diagnosis Date  . Arthritis   . Carotid artery disease (HCC)    a. Bilateral CEA; 50-60% bilateral ICA stenosis, 11/12  . Cellulitis    a. Recurrent, bilateral legs  . CHF (congestive heart failure) ([place])   . Chronic back pain   . Chronic diastolic heart failure ([place])   . Coronary atherosclerosis of native coronary artery    a. 11/2010 NSTEMI/CABG x 4: L-LAD; S-PL; S-OM; S-DX, by PVT.  [name] Critical lower limb ischemia   . Depression   . Essential hypertension, benign   . Herniated disc   . History of blood transfusion   . Ischemic cardiomyopathy    a. 11/2010 TEE: EF 40-45%.  . Migraine   . Mixed hyperlipidemia   . NSTEMI (non-ST elevated myocardial infarction) ([place]) 11/2010  . On home oxygen therapy   . PAD (peripheral artery disease) ([place])   . Pneumonia 2000's X 2  . Stroke ([place])  08/2009  . Suicide attempt 08/2002  . TIA (transient ischemic attack)   . TMJ syndrome   . Type 2 diabetes mellitus ([place])   . Venous insufficiency     Past Surgical History:  Procedure Laterality Date  . ABDOMINAL HYSTERECTOMY    . APPENDECTOMY    . CARDIAC CATHETERIZATION  11/2010  . CARDIAC CATHETERIZATION N/A 09/16/2015   Procedure: Right Heart Cath;  Surgeon: Larey Dresser, MD;  Location: [facility];  Service: Cardiovascular;  Laterality: N/A;  . CAROTID ENDARTERECTOMY Bilateral 2011   Bilateral - Dr. Kellie Simmering  . CHOLECYSTECTOMY    . COLONOSCOPY W/ BIOPSIES AND POLYPECTOMY    . CORONARY ANGIOPLASTY WITH STENT PLACEMENT  03/2013   "1"  . CORONARY ARTERY BYPASS GRAFT  11/24/2010   Procedure: CORONARY ARTERY BYPASS GRAFTING (CABG);  Surgeon: Tharon Aquas Adelene Idler, MD;  Location: [facility];  Service: Open Heart Surgery;  Laterality: N/A;  Coronary Artery Bypass Graft times four on pump utilizing left internal mammary artery and bilateral greater saphenous veins harvested endoscopically, transesophageal echocardiogram   . DEBRIDEMENT TOE Left    "nonhealing wound; 3rd digit  . DILATION AND CURETTAGE OF UTERUS    . ENDARTERECTOMY POPLITEAL Left 10/07/2013   Procedure: LEFT POPLITEAL ENDARTERECTOMY ;  Surgeon: Angelia Mould, MD;  Location: [facility];  Service: Vascular;  Laterality: Left;  . FEMORAL-POPLITEAL BYPASS GRAFT Left 10/07/2013   Procedure:  LEFT FEMORAL-POPLITEAL ARTERY BYPASS GRAFT;  Surgeon: Angelia Mould, MD;  Location: [facility];  Service: Vascular;  Laterality: Left;  . FRACTURE SURGERY    . INTRAOPERATIVE ARTERIOGRAM Left 10/07/2013   Procedure: INTRA OPERATIVE ARTERIOGRAM LEFT LEG;  Surgeon: Angelia Mould, MD;  Location: [facility];  Service: Vascular;  Laterality: Left;  . IR GENERIC HISTORICAL  09/21/2015   IR REMOVAL OF PLURAL CATH W/CUFF 09/21/2015 Marybelle Killings, MD [facility]-INTERV RAD  . LEFT AND RIGHT HEART CATHETERIZATION WITH CORONARY/GRAFT ANGIOGRAM N/A 03/27/2013     Procedure: LEFT AND RIGHT HEART CATHETERIZATION WITH Beatrix Fetters;  Surgeon: Burnell Blanks, MD;  Location: [facility] CATH [facility];  Service: Cardiovascular;  Laterality: N/A;  . LOWER EXTREMITY ANGIOGRAM  09/08/2012; 10/06/2013   "found 100% blockage; unsuccessful attempt at crossing a chronic total occlusion of the left SFA in the setting of critical limb ischemia  . LOWER EXTREMITY ANGIOGRAM Bilateral 09/08/2013   Procedure: LOWER EXTREMITY ANGIOGRAM;  Surgeon: [name], MD;  Location: [facility];  Service: Cardiovascular;  Laterality: Bilateral;  . PERIPHERAL VASCULAR CATHETERIZATION  11/04/2015   Procedure: Peripheral Vascular Atherectomy;  Surgeon: [name], MD;  Location: [facility];  Service: Cardiovascular;;  RT. SFA  . PERIPHERAL VASCULAR CATHETERIZATION Bilateral 11/04/2015   Procedure: Lower Extremity Intervention;  Surgeon: [name], MD;  Location: [facility];  Service: Cardiovascular;  Laterality: Bilateral;  . PERIPHERAL VASCULAR CATHETERIZATION  11/04/2015   Procedure: Peripheral Vascular Balloon Angioplasty;  Surgeon: [name], MD;  Location: [facility];  Service: Cardiovascular;;  TP Trunk  . TOE SURGERY Left    "put pin in 2nd toe"  . TUBAL LIGATION    . WRIST FRACTURE SURGERY Left    "grafted bone from hip to wrist"    Current Outpatient Prescriptions  Medication Sig Dispense Refill  . acetaminophen (TYLENOL) 500 MG tablet Take 500-1,000 mg by mouth every 6 (six) hours as needed for mild pain or moderate pain.     [name] albuterol (PROVENTIL HFA;VENTOLIN HFA) 108 (90 BASE) MCG/ACT inhaler Inhale 2 puffs into the lungs daily as needed for wheezing or shortness of breath.     [name] amLODipine (NORVASC) 10 MG tablet TAKE ONE TABLET BY MOUTH ONCE DAILY 90 tablet 3  . aspirin 81 MG EC tablet Take 81 mg by mouth daily.    [name] atorvastatin (LIPITOR) 40 MG tablet Take 40 mg by mouth daily at 6 PM.    . citalopram (CELEXA) 40  MG tablet Take 40 mg by mouth every morning.     . clopidogrel (PLAVIX) 75 MG tablet TAKE ONE TABLET BY MOUTH ONCE DAILY WITH  BREAKFAST 30 tablet 6  . Docusate Calcium (STOOL SOFTENER PO) Take 200 mg by mouth at bedtime as needed (for constipation).     . gabapentin (NEURONTIN) 300 MG capsule Take 300-600 mg by mouth See admin instructions. 300 mg in the morning and 600 mg prior to bed(time)    . hydrALAZINE (APRESOLINE) 50 MG tablet Take 1.5 tablets (75 mg total) by mouth 3 (three) times daily. 100 tablet 3  . HYDROcodone-acetaminophen (NORCO) 7.5-325 MG tablet Take 1 tablet by mouth every 6 (six) hours as needed for moderate pain.    [name] insulin aspart (NOVOLOG) 100 UNIT/ML injection Inject 3 Units into the skin 3 (three) times daily with meals. (Patient taking differently: Inject 3-7 Units into the skin 3 (three) times daily after meals. Per sliding scale) 10 mL 11  .  insulin detemir (LEVEMIR) 100 UNIT/ML injection Inject 7 Units into the skin at bedtime.    . isosorbide mononitrate (IMDUR) 30 MG 24 hr tablet TAKE ONE TABLET BY MOUTH ONCE DAILY 30 tablet 3  . Multiple Vitamin (MULTIVITAMIN WITH MINERALS) TABS tablet Take 1 tablet by mouth daily.    . nitroGLYCERIN (NITROSTAT) 0.4 MG SL tablet Place 1 tablet (0.4 mg total) under the tongue every 5 (five) minutes as needed for chest pain. Up to 3 doses. If no relief after 3rd dose, proceed to the ED for an evaluation 25 tablet 3  . potassium chloride SA (K-DUR,KLOR-CON) 20 MEQ tablet Take 1 tablet (20 mEq total) by mouth daily. 30 tablet 3  . spironolactone (ALDACTONE) 25 MG tablet Take 0.5 tablets (12.5 mg total) by mouth daily. 16 tablet 6  . tiZANidine (ZANAFLEX) 4 MG tablet Take 4 mg by mouth 2 (two) times daily as needed for muscle spasms.    [name] umeclidinium-vilanterol (ANORO ELLIPTA) 62.5-25 MCG/INH AEPB Inhale 1 puff into the lungs daily.    [name] torsemide (DEMADEX) 20 MG tablet Take 4 tabs in morning (80 mg) 120 tablet 3   No current  facility-administered medications for this visit.    Allergies:  Other; Strawberry extract; Sulfa antibiotics; Coffee bean extract [coffea arabica]; and Tape   Social History: The patient  reports that she quit smoking about 6 years ago. Her smoking use included Cigarettes. She started smoking about 59 years ago. She has a 150.00 pack-year smoking history. She has never used smokeless tobacco. She reports that she drinks alcohol. She reports that she uses drugs, including Marijuana.   ROS:  Please see the history of present illness. Otherwise, complete review of systems is positive for chronic fatigue, uses oxygen supplementation.  All other systems are reviewed and negative.   Physical Exam: VS:  BP (!) 122/48   Pulse (!) 51   Ht _0  (1.676 m)   Wt 181 lb (82.1 kg)   SpO2 98%   BMI 29.21 kg/m , BMI Body mass index is 29.21 kg/m.  Wt Readings from Last 3 Encounters:  12/20/15 181 lb (82.1 kg)  12/10/15 197 lb (89.4 kg)  11/26/15 196 lb (88.9 kg)    Chronically ill-appearing woman, no distress. Wearing oxygen. HEENT: Conjunctiva and lids normal, oropharynx clear. Neck: Supple, no elevated JVP, bilateral CEA scars, no thyromegaly. Lungs: Scattered basilar rhonchi and decreased breath sounds at the very bases, nonlabored breathing at rest. Cardiac:Regular rate and rhythm, no S3, soft systolic murmur, no pericardial rub. Abdomen: Bowel sounds present, no guarding or rebound. Extremities: Trace ankle edema and venous stasis, distal pulses 1+. Skin: Warm and dry. Musculoskeletal: No kyphosis. Neuropsychiatric: Alert and oriented 3, affect appropriate.  ECG: I personally reviewed the tracing from 10/1-/2017 which showed sinus rhythm with old anterior infarct pattern.  Recent Labwork: 09/14/2015: ALT 18; AST 20 09/20/2015: Magnesium 3.0 11/05/2015: Hemoglobin 9.3; Platelets 233 11/25/2015: B Natriuretic Peptide 747.2; BUN 83; Creatinine, Ser 1.78; Potassium 3.9; Sodium  130 12/15/2015: TSH 0.02     Component Value Date/Time   CHOL 78 11/15/2015 1114   TRIG 102 11/15/2015 1114   HDL 22 (L) 11/15/2015 1114   CHOLHDL 3.5 11/15/2015 1114   VLDL 20 11/15/2015 1114   LDLCALC 36 11/15/2015 1114   Other Studies Reviewed Today:  Echocardiogram 07/24/2015: Study Conclusions  - Left ventricle: The cavity size was normal. There was moderate concentric hypertrophy. Systolic function was normal. The estimated ejection fraction was in the range  of 55% to 60%. Wall motion was normal; there were no regional wall motion abnormalities. Features are consistent with a pseudonormal left ventricular filling pattern, with concomitant abnormal relaxation and increased filling pressure (grade 2 diastolic dysfunction). Doppler parameters are consistent with elevated ventricular end-diastolic filling pressure. - Ventricular septum: The contour showed diastolic flattening and systolic flattening. - Aortic valve: Trileaflet; normal thickness leaflets. There was trivial regurgitation. - Aortic root: The aortic root was normal in size. - Ascending aorta: The ascending aorta was normal in size. - Mitral valve: Structurally normal valve. There was mild regurgitation. - Left atrium: The atrium was moderately dilated. - Right ventricle: Systolic function was normal. - Right atrium: The atrium was normal in size. - Tricuspid valve: There was mild regurgitation. - Pulmonic valve: There was trivial regurgitation. - Pulmonary arteries: Systolic pressure was moderately increased. PA peak pressure: 52 mm Hg (S). - Inferior vena cava: The vessel was normal in size. - Pericardium, extracardiac: There was no pericardial effusion.  Assessment and Plan:  1. Chronic diastolic heart failure, complicated by elevated right heart filling pressures/pulmonary hypertension. She has lost a significant amount of weight which could be factorial, but concerned about  potential for overdiuresis and worsening renal function. For now we will cut Demadex back to 80 mg in the morning, use 40 mg in the evening only for increase in weight of 3 pounds in 24 hours. Check BMET. She has follow-up in the heart failure clinic in the next few weeks.  2. CKD stage III, last creatinine up to 1.8.  3. CAD status post CABG, no active angina symptoms on medical therapy.  4. Workup for possible thyroiditis ongoing with Dr. Dorris Fetch.  5. Suspected OSA with sleep study pending.   Current medicines were reviewed with the patient today.   Orders Placed This Encounter  Procedures  . Basic Metabolic Panel (BMET)    Disposition: Follow-up in 6 weeks.  Signed, Satira Sark, MD, [facility] 12/20/2015 1:24 PM    [facility] [place], [place], [place] [address] Phone: [phone]; Fax: [phone]

## 2015-12-20 ENCOUNTER — Ambulatory Visit (INDEPENDENT_AMBULATORY_CARE_PROVIDER_SITE_OTHER): Payer: Medicare Other | Admitting: Cardiology

## 2015-12-20 ENCOUNTER — Encounter: Payer: Self-pay | Admitting: Cardiology

## 2015-12-20 VITALS — BP 122/48 | HR 51 | Ht 66.0 in | Wt 181.0 lb

## 2015-12-20 DIAGNOSIS — N183 Chronic kidney disease, stage 3 unspecified: Secondary | ICD-10-CM

## 2015-12-20 DIAGNOSIS — I2510 Atherosclerotic heart disease of native coronary artery without angina pectoris: Secondary | ICD-10-CM

## 2015-12-20 DIAGNOSIS — E0590 Thyrotoxicosis, unspecified without thyrotoxic crisis or storm: Secondary | ICD-10-CM

## 2015-12-20 DIAGNOSIS — I5032 Chronic diastolic (congestive) heart failure: Secondary | ICD-10-CM | POA: Diagnosis not present

## 2015-12-20 MED ORDER — TORSEMIDE 20 MG PO TABS: ORAL_TABLET | ORAL | 3 refills | Status: DC

## 2015-12-20 NOTE — Patient Instructions (Signed)
Your physician recommends that you schedule a follow-up appointment in: [place]  Your physician has recommended you make the following change in your medication:   CHANGE TORSEMIDE 80 MG (4 TABLETS) IN THE MORNING   Your physician recommends that you return for lab work BMP - [place]   Thank you for choosing [facility]!!

## 2015-12-21 DIAGNOSIS — E1165 Type 2 diabetes mellitus with hyperglycemia: Secondary | ICD-10-CM | POA: Diagnosis not present

## 2015-12-21 DIAGNOSIS — N183 Chronic kidney disease, stage 3 (moderate): Secondary | ICD-10-CM | POA: Diagnosis not present

## 2015-12-21 DIAGNOSIS — I130 Hypertensive heart and chronic kidney disease with heart failure and stage 1 through stage 4 chronic kidney disease, or unspecified chronic kidney disease: Secondary | ICD-10-CM | POA: Diagnosis not present

## 2015-12-21 DIAGNOSIS — I5033 Acute on chronic diastolic (congestive) heart failure: Secondary | ICD-10-CM | POA: Diagnosis not present

## 2015-12-21 DIAGNOSIS — I4892 Unspecified atrial flutter: Secondary | ICD-10-CM | POA: Diagnosis not present

## 2015-12-21 DIAGNOSIS — J90 Pleural effusion, not elsewhere classified: Secondary | ICD-10-CM | POA: Diagnosis not present

## 2015-12-21 DIAGNOSIS — E1122 Type 2 diabetes mellitus with diabetic chronic kidney disease: Secondary | ICD-10-CM | POA: Diagnosis not present

## 2015-12-21 DIAGNOSIS — I5032 Chronic diastolic (congestive) heart failure: Secondary | ICD-10-CM | POA: Diagnosis not present

## 2015-12-23 ENCOUNTER — Encounter: Payer: Self-pay | Admitting: "Endocrinology

## 2015-12-23 ENCOUNTER — Ambulatory Visit (INDEPENDENT_AMBULATORY_CARE_PROVIDER_SITE_OTHER): Payer: Medicare Other | Admitting: "Endocrinology

## 2015-12-23 VITALS — BP 138/62 | HR 68 | Ht 66.0 in | Wt 184.0 lb

## 2015-12-23 DIAGNOSIS — I2510 Atherosclerotic heart disease of native coronary artery without angina pectoris: Secondary | ICD-10-CM | POA: Diagnosis not present

## 2015-12-23 DIAGNOSIS — E061 Subacute thyroiditis: Secondary | ICD-10-CM

## 2015-12-23 NOTE — Progress Notes (Signed)
Subjective:    Patient ID: [name] [name], female    DOB: [birth year], PCP Glenda Chroman, MD.   Past Medical History:  Diagnosis Date  . Arthritis   . Carotid artery disease (HCC)    a. Bilateral CEA; 50-60% bilateral ICA stenosis, 11/12  . Cellulitis    a. Recurrent, bilateral legs  . CHF (congestive heart failure) ([place])   . Chronic back pain   . Chronic diastolic heart failure ([place])   . Coronary atherosclerosis of native coronary artery    a. 11/2010 NSTEMI/CABG x 4: L-LAD; S-PL; S-OM; S-DX, by PVT.  [name] Critical lower limb ischemia   . Depression   . Essential hypertension, benign   . Herniated disc   . History of blood transfusion   . Ischemic cardiomyopathy    a. 11/2010 TEE: EF 40-45%.  . Migraine   . Mixed hyperlipidemia   . NSTEMI (non-ST elevated myocardial infarction) ([place]) 11/2010  . On home oxygen therapy   . PAD (peripheral artery disease) ([place])   . Pneumonia 2000's X 2  . Stroke ([place]) 08/2009  . Suicide attempt 08/2002  . TIA (transient ischemic attack)   . TMJ syndrome   . Type 2 diabetes mellitus ([place])   . Venous insufficiency    Past Surgical History:  Procedure Laterality Date  . ABDOMINAL HYSTERECTOMY    . APPENDECTOMY    . CARDIAC CATHETERIZATION  11/2010  . CARDIAC CATHETERIZATION N/A 09/16/2015   Procedure: Right Heart Cath;  Surgeon: Larey Dresser, MD;  Location: [facility];  Service: Cardiovascular;  Laterality: N/A;  . CAROTID ENDARTERECTOMY Bilateral 2011   Bilateral - Dr. Kellie Simmering  . CHOLECYSTECTOMY    . COLONOSCOPY W/ BIOPSIES AND POLYPECTOMY    . CORONARY ANGIOPLASTY WITH STENT PLACEMENT  03/2013   "1"  . CORONARY ARTERY BYPASS GRAFT  11/24/2010   Procedure: CORONARY ARTERY BYPASS GRAFTING (CABG);  Surgeon: Tharon Aquas Adelene Idler, MD;  Location: [facility];  Service: Open Heart Surgery;  Laterality: N/A;  Coronary Artery Bypass Graft times four on pump utilizing left internal mammary artery and bilateral greater saphenous veins harvested  endoscopically, transesophageal echocardiogram   . DEBRIDEMENT TOE Left    "nonhealing wound; 3rd digit  . DILATION AND CURETTAGE OF UTERUS    . ENDARTERECTOMY POPLITEAL Left 10/07/2013   Procedure: LEFT POPLITEAL ENDARTERECTOMY ;  Surgeon: Angelia Mould, MD;  Location: [facility];  Service: Vascular;  Laterality: Left;  . FEMORAL-POPLITEAL BYPASS GRAFT Left 10/07/2013   Procedure: LEFT FEMORAL-POPLITEAL ARTERY BYPASS GRAFT;  Surgeon: Angelia Mould, MD;  Location: [facility];  Service: Vascular;  Laterality: Left;  . FRACTURE SURGERY    . INTRAOPERATIVE ARTERIOGRAM Left 10/07/2013   Procedure: INTRA OPERATIVE ARTERIOGRAM LEFT LEG;  Surgeon: Angelia Mould, MD;  Location: [facility];  Service: Vascular;  Laterality: Left;  . IR GENERIC HISTORICAL  09/21/2015   IR REMOVAL OF PLURAL CATH W/CUFF 09/21/2015 Marybelle Killings, MD [facility]INTER[facility] RAD  . LEFT AND RIGHT HEART CATHETERIZATION WITH CORONARY/GRAFT ANGIOGRAM N/A 03/27/2013   Procedure: LEFT AND RIGHT HEART CATHETERIZATION WITH Beatrix Fetters;  Surgeon: Burnell Blanks, MD;  Location: [facility];  Service: Cardiovascular;  Laterality: N/A;  . LOWER EXTREMITY ANGIOGRAM  09/08/2012; 10/06/2013   "found 100% blockage; unsuccessful attempt at crossing a chronic total occlusion of the left SFA in the setting of critical limb ischemia  . LOWER EXTREMITY ANGIOGRAM Bilateral 09/08/2013   Procedure: LOWER EXTREMITY ANGIOGRAM;  Surgeon: Roderic Palau  Adora Fridge, MD;  Location: [facility];  Service: Cardiovascular;  Laterality: Bilateral;  . PERIPHERAL VASCULAR CATHETERIZATION  11/04/2015   Procedure: Peripheral Vascular Atherectomy;  Surgeon: [name], MD;  Location: [facility];  Service: Cardiovascular;;  RT. SFA  . PERIPHERAL VASCULAR CATHETERIZATION Bilateral 11/04/2015   Procedure: Lower Extremity Intervention;  Surgeon: [name], MD;  Location: [facility];  Service: Cardiovascular;  Laterality: Bilateral;  .  PERIPHERAL VASCULAR CATHETERIZATION  11/04/2015   Procedure: Peripheral Vascular Balloon Angioplasty;  Surgeon: [name], MD;  Location: [facility];  Service: Cardiovascular;;  TP Trunk  . TOE SURGERY Left    "put pin in 2nd toe"  . TUBAL LIGATION    . WRIST FRACTURE SURGERY Left    "grafted bone from hip to wrist"   Social History   Social History  . Marital status: Widowed    Spouse name: N/A  . Number of children: N/A  . Years of education: N/A   Social History Main Topics  . Smoking status: Former Smoker    Packs/day: 3.00    Years: 50.00    Types: Cigarettes    Start date: 01/24/1956    Quit date: 10/09/2009  . Smokeless tobacco: Never Used  . Alcohol use 0.0 oz/week     Comment: 10/06/2013 "might have a drink q 2-3 months"  . Drug use:     Types: Marijuana     Comment: "smoked pot in my teens"  . Sexual activity: Not Currently   Other Topics Concern  . None   Social History Narrative   Lives in [place] by herself.  She does not work.   Outpatient Encounter Prescriptions as of 12/23/2015  Medication Sig  . acetaminophen (TYLENOL) 500 MG tablet Take 500-1,000 mg by mouth every 6 (six) hours as needed for mild pain or moderate pain.   [name] albuterol (PROVENTIL HFA;VENTOLIN HFA) 108 (90 BASE) MCG/ACT inhaler Inhale 2 puffs into the lungs daily as needed for wheezing or shortness of breath.   [name] amLODipine (NORVASC) 10 MG tablet TAKE ONE TABLET BY MOUTH ONCE DAILY  . aspirin 81 MG EC tablet Take 81 mg by mouth daily.  [name] atorvastatin (LIPITOR) 40 MG tablet Take 40 mg by mouth daily at 6 PM.  . citalopram (CELEXA) 40 MG tablet Take 40 mg by mouth every morning.   . clopidogrel (PLAVIX) 75 MG tablet TAKE ONE TABLET BY MOUTH ONCE DAILY WITH  BREAKFAST  . Docusate Calcium (STOOL SOFTENER PO) Take 200 mg by mouth at bedtime as needed (for constipation).   . gabapentin (NEURONTIN) 300 MG capsule Take 300-600 mg by mouth See admin instructions. 300 mg in the morning and  600 mg prior to bed(time)  . hydrALAZINE (APRESOLINE) 50 MG tablet Take 1.5 tablets (75 mg total) by mouth 3 (three) times daily.  [name] HYDROcodone-acetaminophen (NORCO) 7.5-325 MG tablet Take 1 tablet by mouth every 6 (six) hours as needed for moderate pain.  [name] insulin aspart (NOVOLOG) 100 UNIT/ML injection Inject 3 Units into the skin 3 (three) times daily with meals. (Patient taking differently: Inject 3-7 Units into the skin 3 (three) times daily after meals. Per sliding scale)  . insulin detemir (LEVEMIR) 100 UNIT/ML injection Inject 7 Units into the skin at bedtime.  . isosorbide mononitrate (IMDUR) 30 MG 24 hr tablet TAKE ONE TABLET BY MOUTH ONCE DAILY  . Multiple Vitamin (MULTIVITAMIN WITH MINERALS) TABS tablet Take 1 tablet by mouth daily.  [name]  nitroGLYCERIN (NITROSTAT) 0.4 MG SL tablet Place 1 tablet (0.4 mg total) under the tongue every 5 (five) minutes as needed for chest pain. Up to 3 doses. If no relief after 3rd dose, proceed to the ED for an evaluation  . potassium chloride SA (K-DUR,KLOR-CON) 20 MEQ tablet Take 1 tablet (20 mEq total) by mouth daily.  [name] spironolactone (ALDACTONE) 25 MG tablet Take 0.5 tablets (12.5 mg total) by mouth daily.  [name] tiZANidine (ZANAFLEX) 4 MG tablet Take 4 mg by mouth 2 (two) times daily as needed for muscle spasms.  [name] torsemide (DEMADEX) 20 MG tablet Take 4 tabs in morning (80 mg)  . umeclidinium-vilanterol (ANORO ELLIPTA) 62.5-25 MCG/INH AEPB Inhale 1 puff into the lungs daily.   No facility-administered encounter medications on file as of 12/23/2015.    ALLERGIES: Allergies  Allergen Reactions  . Other Shortness Of Breath, Rash and Other (See Comments)    CANNOT EAT ANY BERRIES!!  . Strawberry Extract Hives, Shortness Of Breath and Rash  . Sulfa Antibiotics Swelling and Other (See Comments)    Bodily Swelling  . Coffee Bean Extract [Coffea Arabica] Itching and Rash  . Tape Other (See Comments)    Tears skin.  Please use "paper" tape only.    VACCINATION STATUS: Immunization History  Administered Date(s) Administered  . Influenza,inj,Quad PF,36+ Mos 10/10/2013, 09/05/2014  . Pneumococcal Polysaccharide-23 03/21/2013, 09/05/2014    HPI  The patient presents today with a medical history as above, and is being seen in f/u for hyperthyroidism requested by Dr. Woody Seller.  The patient has been dealing with symptoms of  weight loss of 6 pounds over the last 2 months.   - She underwent thyroid function tests which showed hyperthyroidism and was initiated on methimazole 10 mg by mouth daily. She has taken this medication for at least 4 weeks time per her report. - On her last visit with me we stopped methimazole and obtained thyroid uptake and scan. The scan showed only 1.2% uptake. The patient reports family history of thyroid dysfunction  in one of her sisters and one of her daughters,  but the patient denies personal history of goiter. - She is returning with repeat thyroid function tests. See below.  Review of Systems Constitutional:  weight loss, + fatigue, - hyperthermia Eyes: no blurry vision, +xerophthalmia ENT: no sore throat, no nodules palpated in throat, no dysphagia/odynophagia, no hoarseness Cardiovascular: no CP/SOB/palpitations/leg swelling Respiratory: + cough, +SOB Gastrointestinal: no N/V/D/C Musculoskeletal: no muscle/joint aches Skin: no rashes Neurological: +tremors, - numbness/tingling, + dizziness Psychiatric: +anxiety  Objective:    BP 138/62   Pulse 68   Ht _0  (1.676 m)   Wt 184 lb (83.5 kg)   BMI 29.70 kg/m   Wt Readings from Last 3 Encounters:  12/23/15 184 lb (83.5 kg)  12/20/15 181 lb (82.1 kg)  12/10/15 197 lb (89.4 kg)    Physical Exam Constitutional: overweight, in NAD, she uses oxygen per nasal cannula Eyes: PERRLA, EOMI,  ENT: moist mucous membranes, no thyromegaly, no cervical lymphadenopathy Cardiovascular: Distant heart sounds midline sternotomy scar from prior coronary artery  bypass graft Respiratory: Tight  chest was poor air entry. Gastrointestinal: abdomen soft, NT, ND, BS+ Musculoskeletal: no deformities, strength intact in all 4 Skin: moist, warm, no rashes Neurological: + tremor with outstretched hands, DTR normal in all 4  Results for orders placed or performed in visit on 12/10/15  TSH  Result Value Ref Range   TSH 0.02 (L) mIU/L  T4, free  Result Value Ref Range   Free T4 1.7 0.8 - 1.8 ng/dL  Thyroid peroxidase antibody  Result Value Ref Range   Thyroperoxidase Ab SerPl-aCnc 1 <9 IU/mL  Thyroglobulin antibody  Result Value Ref Range   Thyroglobulin Ab <1 <2 IU/mL   Complete Blood Count (Most recent): Lab Results  Component Value Date   WBC 6.0 11/05/2015   HGB 9.3 (L) 11/05/2015   HCT 30.5 (L) 11/05/2015   MCV 84.3 11/05/2015   PLT 233 11/05/2015   Chemistry (most recent): Lab Results  Component Value Date   NA 130 (L) 11/25/2015   K 3.9 11/25/2015   CL 92 (L) 11/25/2015   CO2 26 11/25/2015   BUN 83 (H) 11/25/2015   CREATININE 1.78 (H) 11/25/2015   Diabetic Labs (most recent): Lab Results  Component Value Date   HGBA1C 6.4 (H) 09/14/2015   HGBA1C 10.8 (H) 03/18/2013   HGBA1C 8.1 (H) 11/22/2010   Lipid profile (most recent): Lab Results  Component Value Date   TRIG 102 11/15/2015   CHOL 78 11/15/2015     Results for [name] [name], [name] [name] (MRN [id number]) as of 12/23/2015 10:23  Ref. Range 09/15/2015 09:27 12/15/2015 10:29  TSH Latest Units: mIU/L 0.103 (L) 0.02 (L)  Triiodothyronine,Free,Serum Latest Ref Range: 2.0 - 4.4 pg/mL 3.1   T4,Free(Direct) Latest Ref Range: 0.8 - 1.8 ng/dL 2.33 (H) 1.7  Thyroglobulin Ab Latest Ref Range: <2 IU/mL  <1  Thyroperoxidase Ab SerPl-aCnc Latest Ref Range: <9 IU/mL  1    Assessment & Plan:   1. Subacute thyroiditis: - Her repeat thyroid function tests are much better with free T4 1.7, TSH still suppressed at 0.02. - Her thyroid uptake and scan did not agree with her thyroid function tests.  The scan is showing only 1.2% uptake. This is suggestive of subacute thyroiditis, with possible transient thyrotoxicosis during her initial presentation.  - This may resolve spontaneously or progress to clinical hypothyroidism. On rare occasions she may continue to have hyperthyroidism. -  She will not need therapy for hyperthyroidism. - Need repeat thyroid function test in 3 months, with office visit with me. - She has well controlled type 2 diabetes with A1c of 6.4% following with her primary care doctor. - I advised patient to maintain close follow up with Glenda Chroman, MD for primary care needs.  Follow up plan: Return in about 3 months (around 03/22/2016) for follow up with pre-visit labs.  Glade Lloyd, MD Phone: [phone]  Fax: [phone]   12/23/2015, 10:56 AM

## 2015-12-24 DIAGNOSIS — I2510 Atherosclerotic heart disease of native coronary artery without angina pectoris: Secondary | ICD-10-CM | POA: Diagnosis not present

## 2015-12-24 DIAGNOSIS — I5033 Acute on chronic diastolic (congestive) heart failure: Secondary | ICD-10-CM | POA: Diagnosis not present

## 2015-12-24 DIAGNOSIS — I130 Hypertensive heart and chronic kidney disease with heart failure and stage 1 through stage 4 chronic kidney disease, or unspecified chronic kidney disease: Secondary | ICD-10-CM | POA: Diagnosis not present

## 2015-12-24 DIAGNOSIS — E1122 Type 2 diabetes mellitus with diabetic chronic kidney disease: Secondary | ICD-10-CM | POA: Diagnosis not present

## 2015-12-24 DIAGNOSIS — E119 Type 2 diabetes mellitus without complications: Secondary | ICD-10-CM | POA: Diagnosis not present

## 2015-12-24 DIAGNOSIS — E1165 Type 2 diabetes mellitus with hyperglycemia: Secondary | ICD-10-CM | POA: Diagnosis not present

## 2015-12-24 DIAGNOSIS — I509 Heart failure, unspecified: Secondary | ICD-10-CM | POA: Diagnosis not present

## 2015-12-24 DIAGNOSIS — N183 Chronic kidney disease, stage 3 (moderate): Secondary | ICD-10-CM | POA: Diagnosis not present

## 2015-12-24 DIAGNOSIS — J90 Pleural effusion, not elsewhere classified: Secondary | ICD-10-CM | POA: Diagnosis not present

## 2015-12-28 DIAGNOSIS — E1165 Type 2 diabetes mellitus with hyperglycemia: Secondary | ICD-10-CM | POA: Diagnosis not present

## 2015-12-28 DIAGNOSIS — N183 Chronic kidney disease, stage 3 (moderate): Secondary | ICD-10-CM | POA: Diagnosis not present

## 2015-12-28 DIAGNOSIS — I5033 Acute on chronic diastolic (congestive) heart failure: Secondary | ICD-10-CM | POA: Diagnosis not present

## 2015-12-28 DIAGNOSIS — J90 Pleural effusion, not elsewhere classified: Secondary | ICD-10-CM | POA: Diagnosis not present

## 2015-12-28 DIAGNOSIS — E1122 Type 2 diabetes mellitus with diabetic chronic kidney disease: Secondary | ICD-10-CM | POA: Diagnosis not present

## 2015-12-28 DIAGNOSIS — I130 Hypertensive heart and chronic kidney disease with heart failure and stage 1 through stage 4 chronic kidney disease, or unspecified chronic kidney disease: Secondary | ICD-10-CM | POA: Diagnosis not present

## 2015-12-30 DIAGNOSIS — E1165 Type 2 diabetes mellitus with hyperglycemia: Secondary | ICD-10-CM | POA: Diagnosis not present

## 2015-12-30 DIAGNOSIS — E1122 Type 2 diabetes mellitus with diabetic chronic kidney disease: Secondary | ICD-10-CM | POA: Diagnosis not present

## 2015-12-30 DIAGNOSIS — I130 Hypertensive heart and chronic kidney disease with heart failure and stage 1 through stage 4 chronic kidney disease, or unspecified chronic kidney disease: Secondary | ICD-10-CM | POA: Diagnosis not present

## 2015-12-30 DIAGNOSIS — N183 Chronic kidney disease, stage 3 (moderate): Secondary | ICD-10-CM | POA: Diagnosis not present

## 2015-12-30 DIAGNOSIS — I5033 Acute on chronic diastolic (congestive) heart failure: Secondary | ICD-10-CM | POA: Diagnosis not present

## 2015-12-30 DIAGNOSIS — J90 Pleural effusion, not elsewhere classified: Secondary | ICD-10-CM | POA: Diagnosis not present

## 2016-01-04 DIAGNOSIS — E1122 Type 2 diabetes mellitus with diabetic chronic kidney disease: Secondary | ICD-10-CM | POA: Diagnosis not present

## 2016-01-04 DIAGNOSIS — J90 Pleural effusion, not elsewhere classified: Secondary | ICD-10-CM | POA: Diagnosis not present

## 2016-01-04 DIAGNOSIS — N183 Chronic kidney disease, stage 3 (moderate): Secondary | ICD-10-CM | POA: Diagnosis not present

## 2016-01-04 DIAGNOSIS — E1165 Type 2 diabetes mellitus with hyperglycemia: Secondary | ICD-10-CM | POA: Diagnosis not present

## 2016-01-04 DIAGNOSIS — I5033 Acute on chronic diastolic (congestive) heart failure: Secondary | ICD-10-CM | POA: Diagnosis not present

## 2016-01-04 DIAGNOSIS — I130 Hypertensive heart and chronic kidney disease with heart failure and stage 1 through stage 4 chronic kidney disease, or unspecified chronic kidney disease: Secondary | ICD-10-CM | POA: Diagnosis not present

## 2016-01-05 ENCOUNTER — Ambulatory Visit (HOSPITAL_BASED_OUTPATIENT_CLINIC_OR_DEPARTMENT_OTHER): Payer: Medicare Other | Attending: Cardiology | Admitting: Cardiology

## 2016-01-05 VITALS — Ht 66.0 in | Wt 190.0 lb

## 2016-01-05 DIAGNOSIS — I5033 Acute on chronic diastolic (congestive) heart failure: Secondary | ICD-10-CM | POA: Diagnosis not present

## 2016-01-05 DIAGNOSIS — E1165 Type 2 diabetes mellitus with hyperglycemia: Secondary | ICD-10-CM | POA: Diagnosis not present

## 2016-01-05 DIAGNOSIS — R0683 Snoring: Secondary | ICD-10-CM | POA: Diagnosis not present

## 2016-01-05 DIAGNOSIS — E1122 Type 2 diabetes mellitus with diabetic chronic kidney disease: Secondary | ICD-10-CM | POA: Diagnosis not present

## 2016-01-05 DIAGNOSIS — N183 Chronic kidney disease, stage 3 (moderate): Secondary | ICD-10-CM | POA: Diagnosis not present

## 2016-01-05 DIAGNOSIS — J90 Pleural effusion, not elsewhere classified: Secondary | ICD-10-CM | POA: Diagnosis not present

## 2016-01-05 DIAGNOSIS — I130 Hypertensive heart and chronic kidney disease with heart failure and stage 1 through stage 4 chronic kidney disease, or unspecified chronic kidney disease: Secondary | ICD-10-CM | POA: Diagnosis not present

## 2016-01-06 DIAGNOSIS — E1165 Type 2 diabetes mellitus with hyperglycemia: Secondary | ICD-10-CM | POA: Diagnosis not present

## 2016-01-06 DIAGNOSIS — I5033 Acute on chronic diastolic (congestive) heart failure: Secondary | ICD-10-CM | POA: Diagnosis not present

## 2016-01-06 DIAGNOSIS — I130 Hypertensive heart and chronic kidney disease with heart failure and stage 1 through stage 4 chronic kidney disease, or unspecified chronic kidney disease: Secondary | ICD-10-CM | POA: Diagnosis not present

## 2016-01-06 DIAGNOSIS — N183 Chronic kidney disease, stage 3 (moderate): Secondary | ICD-10-CM | POA: Diagnosis not present

## 2016-01-06 DIAGNOSIS — J90 Pleural effusion, not elsewhere classified: Secondary | ICD-10-CM | POA: Diagnosis not present

## 2016-01-06 DIAGNOSIS — E1122 Type 2 diabetes mellitus with diabetic chronic kidney disease: Secondary | ICD-10-CM | POA: Diagnosis not present

## 2016-01-07 ENCOUNTER — Ambulatory Visit (HOSPITAL_COMMUNITY)
Admission: RE | Admit: 2016-01-07 | Discharge: 2016-01-07 | Disposition: A | Discharge status: Home / Self Care | Payer: Medicare Other | Source: Ambulatory Visit | Attending: Cardiology | Admitting: Cardiology

## 2016-01-07 VITALS — BP 124/62 | HR 61 | Wt 183.0 lb

## 2016-01-07 DIAGNOSIS — Z951 Presence of aortocoronary bypass graft: Secondary | ICD-10-CM | POA: Insufficient documentation

## 2016-01-07 DIAGNOSIS — N183 Chronic kidney disease, stage 3 unspecified: Secondary | ICD-10-CM

## 2016-01-07 DIAGNOSIS — E0590 Thyrotoxicosis, unspecified without thyrotoxic crisis or storm: Secondary | ICD-10-CM

## 2016-01-07 DIAGNOSIS — I2510 Atherosclerotic heart disease of native coronary artery without angina pectoris: Secondary | ICD-10-CM | POA: Diagnosis not present

## 2016-01-07 DIAGNOSIS — I130 Hypertensive heart and chronic kidney disease with heart failure and stage 1 through stage 4 chronic kidney disease, or unspecified chronic kidney disease: Secondary | ICD-10-CM | POA: Insufficient documentation

## 2016-01-07 DIAGNOSIS — I5033 Acute on chronic diastolic (congestive) heart failure: Secondary | ICD-10-CM | POA: Insufficient documentation

## 2016-01-07 DIAGNOSIS — Z9889 Other specified postprocedural states: Secondary | ICD-10-CM | POA: Insufficient documentation

## 2016-01-07 DIAGNOSIS — I5020 Unspecified systolic (congestive) heart failure: Secondary | ICD-10-CM | POA: Insufficient documentation

## 2016-01-07 DIAGNOSIS — I5032 Chronic diastolic (congestive) heart failure: Secondary | ICD-10-CM | POA: Diagnosis not present

## 2016-01-07 DIAGNOSIS — I739 Peripheral vascular disease, unspecified: Secondary | ICD-10-CM

## 2016-01-07 LAB — BASIC METABOLIC PANEL
ANION GAP: 12 (ref 5–15)
BUN: 61 mg/dL — ABNORMAL HIGH (ref 6–20)
CALCIUM: 9.5 mg/dL (ref 8.9–10.3)
CO2: 26 mmol/L (ref 22–32)
Chloride: 92 mmol/L — ABNORMAL LOW (ref 101–111)
Creatinine, Ser: 1.53 mg/dL — ABNORMAL HIGH (ref 0.44–1.00)
GFR, EST AFRICAN AMERICAN: 39 mL/min — AB (ref >60)
GFR, EST NON AFRICAN AMERICAN: 34 mL/min — AB (ref >60)
GLUCOSE: 359 mg/dL — AB (ref 65–99)
POTASSIUM: 4.1 mmol/L (ref 3.5–5.1)
SODIUM: 130 mmol/L — AB (ref 135–145)

## 2016-01-07 LAB — CBC
HCT: 36.1 % (ref 36.0–46.0)
Hemoglobin: 11.5 g/dL — ABNORMAL LOW (ref 12.0–15.0)
MCH: 26.1 pg (ref 26.0–34.0)
MCHC: 31.9 g/dL (ref 30.0–36.0)
MCV: 81.9 fL (ref 78.0–100.0)
PLATELETS: 197 10*3/uL (ref 150–400)
RBC: 4.41 MIL/uL (ref 3.87–5.11)
RDW: 19.3 % — AB (ref 11.5–15.5)
WBC: 8.6 10*3/uL (ref 4.0–10.5)

## 2016-01-07 MED ORDER — TORSEMIDE 20 MG PO TABS: ORAL_TABLET | ORAL | 3 refills | Status: DC

## 2016-01-07 NOTE — Patient Instructions (Signed)
CHANGE Lasix to Take 80 mg(4 tabs) in the AM and 40 mg (2 tabs) in the PM every OTHER day  Labs today We will only contact you if something comes back abnormal or we need to make some changes. Otherwise no news is good news!  Labs again in 10-14 days  Your physician recommends that you schedule a follow-up appointment in: 2 months with Dr Aundra Dubin  Do the following things EVERYDAY: 1) Weigh yourself in the morning before breakfast. Write it down and keep it in a log. 2) Take your medicines as prescribed 3) Eat low salt foods-Limit salt (sodium) to 2000 mg per day.  4) Stay as active as you can everyday 5) Limit all fluids for the day to less than 2 liters

## 2016-01-08 NOTE — Progress Notes (Signed)
PCP: Dr. Woody Seller Cardiology: Dr. Domenic Polite HF Cardiology: Dr. Aundra Dubin  70 yo with history of chronic diastolic CHF, CAD s/p CABG, chronic right pleural effusion, CKD stage III, and PAD presents for CHF clinic evaluation.  She has had multiple admissions with diastolic CHF complicated by CKD.  She was admitted in 7/17 for diuresis.  Worsening renal function was noted.  Lisinopril was stopped with elevated creatinine and Coreg was stopped with bradycardia.  Last echo in 7/17 showed EF 55-60% with grade II diastolic dysfunction and D-shaped interventricular septum.  She has been wearing home oxygen for > 1 year.    She was admitted again in 9/17 with volume overload.  She was diuresed extensively.  Pleurx catheter was removed from right chest.  RHC showed elevated right and left heart filling pressure with pulmonary venous hypertension.  She was diagnosed with hyperthyroidism and started on methimazole.  She was seen by Dr Dorris Fetch (endocrinology) and thought to have subacute thyroiditis rather than Graves disease.  She is now off methimazole and thyroid indices have normalized.   After discharge, in 10/17 she had peripheral angiography PV => right SFA and right tibioperoneal trunk angioplasties.  She now has only rare claudication.     Weight is down 10 lbs compared to prior visit.  Torsemide was decreased to 80 mg daily a few weeks ago due to rising creatinine, which has improved subsequently.  She denies dyspnea walking on flat ground.  She uses a walker.  No orthopnea/PND. No chest pain.   Labs (7/17): K 4.2, creatinine 2.04 Labs (10/17): K 4.5, creatinine 1.68, HCT 30.5 Labs (11/17): K 3.9, creatinine 1.78, LDL 36 Labs (12/17): K 3.9, creatinine 1.49  PMH: 1. Chronic diastolic CHF: Multiple admissions.  - Echo (7/17) with EF 55-60%, grade II diastolic dysfunction, D-shaped interventricular septum, mild MR, PASP 52 mmHg.  - Uses home oxygen.  - RHC (9/17): mean 15, PA 73/26 mean 42, mean PCWP 25, CI  2.84, PVR 3.0 WU => pulmonary venous hypertension.  2. H/o CVA 3. Recurrent right pleural effusion: S/p Pleurx catheter placement.  4. Depression 5. HTN 6. Carotid stenosis: Bilateral CEAs 7. Type II diabetes.  8. CAD: NSTEMI 11/12, had CABG with LIMA-LAD, SVG-PLV, SVG-OM, SVG-D.   - LHC (9/15) with DES to SVG-D.  Other grafts patent.  9. PAD: Left femoral-popliteal bypass 9/15.  - peripheral arterial dopplers (2/17) with right ABI 0.53, left ABI 1.02.  - 10/17 right SFA atherectomy and right tibioperoneal trunk atherectomy.  10. CKD stage III. Lisinopril stopped with increased creatinine.  11. Bradycardia: Stopped Coreg.  12. Hyperthyroidism: Diagnosed in 9/17. Subacute thyroiditis.  Followed by Dr. Dorris Fetch.   SH: Lives in [place] with son.  Prior smoker, quit 2012.  Retired.   Family History  Problem Relation Age of Onset  . Coronary artery disease Father     Died with MI age 2  . Heart disease Father     before age 33  . Heart attack Father   . Diabetes type II Mother   . Hypertension Mother   . Diabetes Mother   . Heart disease Mother   . Hyperlipidemia Mother   . Heart failure Sister   . Cancer Sister   . Cancer Brother     Lung cancer  . Diabetes Daughter   . Hyperlipidemia Daughter   . Diabetes Son   . Hyperlipidemia Son    ROS: All systems reviewed and negative except as per HPI.   BP 124/62 (BP Location:  Right Arm, Patient Position: Sitting, Cuff Size: Normal)   Pulse 61   Wt 183 lb (83 kg)   SpO2 94% Comment: on 2 L  BMI 29.54 kg/m  General: NAD Neck: JVP 9-10 cm, no thyromegaly or thyroid nodule.  Lungs: Crackles at bases bilaterally.  CV: Nondisplaced PMI.  Heart regular S1/S2, no S3/S4, 1/6 SEM RUSB. Trace ankle edema bilaterally.  No carotid bruit. Difficult to palpate pedal pulses.  Abdomen: Soft, nontender, no hepatosplenomegaly, no distention.  Skin: Intact without lesions or rashes.  Neurologic: Alert and oriented x 3. Fine tremor noted.  Psych:  Normal affect. Extremities: No clubbing.   HEENT: Normal.   Assessment/plan: 1. Acute on chronic diastolic CHF: I suspect that there is a significant component of RV failure as well.  She is mildly volume overloaded.  Torsemide was cut back from 80 qam/40 qpm to 80 mg daily. - Increase torsemide to 80 qam and 40 mg in the afternoon every other day. BMET today, repeat 2 wks.  - Wear graded compression stockings during the day.   2. CKD stage III: BMET in 2 weeks.   3. CAD: s/p CABG.  No chest pain.  H/o PCI to SVG-D in 9/15.  - Continue ASA 81, Plavix, statin.  4. Recurrent right pleural effusion: Pleurx catheter removed in 9/17. 5. Suspect OSA: Had sleep study, awaiting results.   6. PAD: s/p recent right leg atherectomies.  Now no claudication-type pain.  No sores on legs/feet.   7. Hyperlipidemia: Goal LDL < 70, lipids at goal in 11/17.   8. Hyperthyroidism: Suspect subclinical thyroiditis rather than Graves disease.  Thyroid indices are now normal and he is off methimazole.  Dr. Dorris Fetch following.   Loralie Champagne 01/08/2016 9:25 PM

## 2016-01-09 NOTE — Procedures (Signed)
Patient Name: [name] [name], Bef[place] Date: 01/05/2016 Gender: Female D.O.B: November 28, 1945 Age (years): 70 Referring Provider: Loralie Champagne Height (inches): 49 Interpreting Physician: Fransico Him MD, ABSM Weight (lbs): 190 RPSGT: Gerhard Perches BMI: 31 MRN: [id number] Neck Size: 15.00  CLINICAL INFORMATION Sleep Study Type: NPSG  Indication for sleep study: Congestive Heart Failure, Snoring  Epworth Sleepiness Score: 12  SLEEP STUDY TECHNIQUE As per the AASM Manual for the Scoring of Sleep and Associated Events v2.3 (April 2016) with a hypopnea requiring 4% desaturations.  The channels recorded and monitored were frontal, central and occipital EEG, electrooculogram (EOG), submentalis EMG (chin), nasal and oral airflow, thoracic and abdominal wall motion, anterior tibialis EMG, snore microphone, electrocardiogram, and pulse oximetry.  MEDICATIONS Medications self-administered by patient taken the night of the study : GABAPENTIN, HYDRALAZINE, TIZANIDINE  SLEEP ARCHITECTURE The study was initiated at 10:06:10 PM and ended at 4:27:52 AM.  Sleep onset time was 22.2 minutes and the sleep efficiency was 56.7%. The total sleep time was 216.5 minutes.  Stage REM latency was 273.0 minutes.  The patient spent 16.17% of the night in stage N1 sleep, 78.06% in stage N2 sleep, 4.16% in stage N3 and 1.62% in REM.  Alpha intrusion was absent.  Supine sleep was 100.00%.  RESPIRATORY PARAMETERS The overall apnea/hypopnea index (AHI) was 0.0 per hour. There were 0 total apneas, including 0 obstructive, 0 central and 0 mixed apneas. There were 0 hypopneas and 0 RERAs.  The AHI during Stage REM sleep was 0.0 per hour.  AHI while supine was 0.0 per hour.  The mean oxygen saturation was 95.61%. The minimum SpO2 during sleep was 94.00%.  snoring was noted during this study.  CARDIAC DATA The 2 lead EKG demonstrated sinus rhythm. The mean heart rate was 47.63 beats per minute. Other  EKG findings include: PVCs.  LEG MOVEMENT DATA The total PLMS were 149 with a resulting PLMS index of 41.29. Associated arousal with leg movement index was 3.3 .  IMPRESSIONS - No significant obstructive sleep apnea occurred during this study (AHI = 0.0/h).  The patient slept with oxygen at 2L [place] as she has been doing nightly at home.  - No significant central sleep apnea occurred during this study (CAI = 0.0/h). - The patient had minimal or no oxygen desaturation during the study (Min O2 = 94.00%) on 2L [place]. - No snoring was audible during this study. - EKG findings include PVCs. - Moderate periodic limb movements of sleep occurred during the study. No significant associated arousals.  DIAGNOSIS - Normal study - PVCs  RECOMMENDATIONS - Avoid alcohol, sedatives and other CNS depressants that may worsen sleep apnea and disrupt normal sleep architecture. - Sleep hygiene should be reviewed to assess factors that may improve sleep quality. - Weight management and regular exercise should be initiated or continued if appropriate.  [place], American Board of Sleep Medicine  ELECTRONICALLY SIGNED ON:  01/09/2016, 4:07 PM [place] PH: [phone]   FX: [phone] [place]

## 2016-01-10 ENCOUNTER — Other Ambulatory Visit: Payer: Self-pay | Admitting: Cardiology

## 2016-01-11 DIAGNOSIS — N183 Chronic kidney disease, stage 3 (moderate): Secondary | ICD-10-CM | POA: Diagnosis not present

## 2016-01-11 DIAGNOSIS — E1122 Type 2 diabetes mellitus with diabetic chronic kidney disease: Secondary | ICD-10-CM | POA: Diagnosis not present

## 2016-01-11 DIAGNOSIS — E1165 Type 2 diabetes mellitus with hyperglycemia: Secondary | ICD-10-CM | POA: Diagnosis not present

## 2016-01-11 DIAGNOSIS — I130 Hypertensive heart and chronic kidney disease with heart failure and stage 1 through stage 4 chronic kidney disease, or unspecified chronic kidney disease: Secondary | ICD-10-CM | POA: Diagnosis not present

## 2016-01-11 DIAGNOSIS — J90 Pleural effusion, not elsewhere classified: Secondary | ICD-10-CM | POA: Diagnosis not present

## 2016-01-11 DIAGNOSIS — I5033 Acute on chronic diastolic (congestive) heart failure: Secondary | ICD-10-CM | POA: Diagnosis not present

## 2016-01-11 NOTE — Progress Notes (Signed)
Called the patient and gave her the sleep study results, she verbalized understanding

## 2016-01-13 DIAGNOSIS — E1165 Type 2 diabetes mellitus with hyperglycemia: Secondary | ICD-10-CM | POA: Diagnosis not present

## 2016-01-13 DIAGNOSIS — N183 Chronic kidney disease, stage 3 (moderate): Secondary | ICD-10-CM | POA: Diagnosis not present

## 2016-01-13 DIAGNOSIS — E1122 Type 2 diabetes mellitus with diabetic chronic kidney disease: Secondary | ICD-10-CM | POA: Diagnosis not present

## 2016-01-13 DIAGNOSIS — I5033 Acute on chronic diastolic (congestive) heart failure: Secondary | ICD-10-CM | POA: Diagnosis not present

## 2016-01-13 DIAGNOSIS — I130 Hypertensive heart and chronic kidney disease with heart failure and stage 1 through stage 4 chronic kidney disease, or unspecified chronic kidney disease: Secondary | ICD-10-CM | POA: Diagnosis not present

## 2016-01-13 DIAGNOSIS — J90 Pleural effusion, not elsewhere classified: Secondary | ICD-10-CM | POA: Diagnosis not present

## 2016-01-17 ENCOUNTER — Other Ambulatory Visit: Payer: Self-pay | Admitting: "Endocrinology

## 2016-01-17 DIAGNOSIS — E061 Subacute thyroiditis: Secondary | ICD-10-CM | POA: Diagnosis not present

## 2016-01-17 LAB — T3, FREE: T3 FREE: 2.3 pg/mL (ref 2.3–4.2)

## 2016-01-17 LAB — TSH: TSH: 4.69 mIU/L — ABNORMAL HIGH

## 2016-01-17 LAB — T4, FREE: Free T4: 0.8 ng/dL (ref 0.8–1.8)

## 2016-01-18 DIAGNOSIS — E1165 Type 2 diabetes mellitus with hyperglycemia: Secondary | ICD-10-CM | POA: Diagnosis not present

## 2016-01-18 DIAGNOSIS — I5033 Acute on chronic diastolic (congestive) heart failure: Secondary | ICD-10-CM | POA: Diagnosis not present

## 2016-01-18 DIAGNOSIS — I130 Hypertensive heart and chronic kidney disease with heart failure and stage 1 through stage 4 chronic kidney disease, or unspecified chronic kidney disease: Secondary | ICD-10-CM | POA: Diagnosis not present

## 2016-01-18 DIAGNOSIS — J90 Pleural effusion, not elsewhere classified: Secondary | ICD-10-CM | POA: Diagnosis not present

## 2016-01-18 DIAGNOSIS — E1122 Type 2 diabetes mellitus with diabetic chronic kidney disease: Secondary | ICD-10-CM | POA: Diagnosis not present

## 2016-01-18 DIAGNOSIS — N183 Chronic kidney disease, stage 3 (moderate): Secondary | ICD-10-CM | POA: Diagnosis not present

## 2016-01-20 ENCOUNTER — Telehealth (HOSPITAL_COMMUNITY): Payer: Self-pay | Admitting: *Deleted

## 2016-01-20 DIAGNOSIS — E1122 Type 2 diabetes mellitus with diabetic chronic kidney disease: Secondary | ICD-10-CM | POA: Diagnosis not present

## 2016-01-20 DIAGNOSIS — E1165 Type 2 diabetes mellitus with hyperglycemia: Secondary | ICD-10-CM | POA: Diagnosis not present

## 2016-01-20 DIAGNOSIS — I5033 Acute on chronic diastolic (congestive) heart failure: Secondary | ICD-10-CM | POA: Diagnosis not present

## 2016-01-20 DIAGNOSIS — I130 Hypertensive heart and chronic kidney disease with heart failure and stage 1 through stage 4 chronic kidney disease, or unspecified chronic kidney disease: Secondary | ICD-10-CM | POA: Diagnosis not present

## 2016-01-20 DIAGNOSIS — J90 Pleural effusion, not elsewhere classified: Secondary | ICD-10-CM | POA: Diagnosis not present

## 2016-01-20 DIAGNOSIS — N183 Chronic kidney disease, stage 3 (moderate): Secondary | ICD-10-CM | POA: Diagnosis not present

## 2016-01-20 NOTE — Telephone Encounter (Signed)
[facility] left a VM earlier to let us know pt is suppose to come in there tomorrow for labs but they do not have an order and for [place] to please fax the order to them at [phone].  Per chart pt is suppose to get a bmet, order faxed

## 2016-01-24 DIAGNOSIS — I509 Heart failure, unspecified: Secondary | ICD-10-CM | POA: Diagnosis not present

## 2016-01-24 DIAGNOSIS — B356 Tinea cruris: Secondary | ICD-10-CM | POA: Diagnosis not present

## 2016-01-24 DIAGNOSIS — I639 Cerebral infarction, unspecified: Secondary | ICD-10-CM | POA: Diagnosis not present

## 2016-01-24 DIAGNOSIS — M545 Low back pain: Secondary | ICD-10-CM | POA: Diagnosis not present

## 2016-01-24 DIAGNOSIS — G20 Parkinson's disease: Secondary | ICD-10-CM | POA: Diagnosis not present

## 2016-01-24 DIAGNOSIS — Z299 Encounter for prophylactic measures, unspecified: Secondary | ICD-10-CM | POA: Diagnosis not present

## 2016-01-24 DIAGNOSIS — Z713 Dietary counseling and surveillance: Secondary | ICD-10-CM | POA: Diagnosis not present

## 2016-01-24 DIAGNOSIS — Z6831 Body mass index (BMI) 31.0-31.9, adult: Secondary | ICD-10-CM | POA: Diagnosis not present

## 2016-01-25 DIAGNOSIS — N183 Chronic kidney disease, stage 3 (moderate): Secondary | ICD-10-CM | POA: Diagnosis not present

## 2016-01-25 DIAGNOSIS — E1122 Type 2 diabetes mellitus with diabetic chronic kidney disease: Secondary | ICD-10-CM | POA: Diagnosis not present

## 2016-01-25 DIAGNOSIS — I5033 Acute on chronic diastolic (congestive) heart failure: Secondary | ICD-10-CM | POA: Diagnosis not present

## 2016-01-25 DIAGNOSIS — I130 Hypertensive heart and chronic kidney disease with heart failure and stage 1 through stage 4 chronic kidney disease, or unspecified chronic kidney disease: Secondary | ICD-10-CM | POA: Diagnosis not present

## 2016-01-25 DIAGNOSIS — J90 Pleural effusion, not elsewhere classified: Secondary | ICD-10-CM | POA: Diagnosis not present

## 2016-01-25 DIAGNOSIS — E1165 Type 2 diabetes mellitus with hyperglycemia: Secondary | ICD-10-CM | POA: Diagnosis not present

## 2016-01-28 ENCOUNTER — Encounter: Payer: Self-pay | Admitting: Cardiology

## 2016-01-28 ENCOUNTER — Ambulatory Visit (INDEPENDENT_AMBULATORY_CARE_PROVIDER_SITE_OTHER): Payer: Medicare Other | Admitting: Cardiology

## 2016-01-28 VITALS — BP 153/61 | HR 52 | Ht 66.0 in | Wt 191.2 lb

## 2016-01-28 DIAGNOSIS — I2510 Atherosclerotic heart disease of native coronary artery without angina pectoris: Secondary | ICD-10-CM

## 2016-01-28 DIAGNOSIS — I5033 Acute on chronic diastolic (congestive) heart failure: Secondary | ICD-10-CM | POA: Diagnosis not present

## 2016-01-28 DIAGNOSIS — N183 Chronic kidney disease, stage 3 unspecified: Secondary | ICD-10-CM

## 2016-01-28 DIAGNOSIS — E782 Mixed hyperlipidemia: Secondary | ICD-10-CM

## 2016-01-28 MED ORDER — TORSEMIDE 20 MG PO TABS: ORAL_TABLET | ORAL | 0 refills | Status: DC

## 2016-01-28 NOTE — Patient Instructions (Signed)
Medication Instructions:   Increase Demadex to 24m every morning & 477mevery evening x 2 weeks, then resume previous dosing.    Continue all other medications.    Labwork: none  Testing/Procedures: none  Follow-Up: 2 months   Any Other Special Instructions Will Be Listed Below (If Applicable).  If you need a refill on your cardiac medications before your next appointment, please call your pharmacy.

## 2016-01-28 NOTE — Progress Notes (Signed)
Cardiology Office Note  Date: 01/28/2016   ID: [name] [name] Seehafer, DOB 12/07/1945, MRN [id number]  PCP: Glenda Chroman, MD  Primary Cardiologist: Rozann Lesches, MD   Chief Complaint  Patient presents with  . Diastolic heart failure    History of Present Illness: [name] [name] is a 71 y.o. female last seen in December 2017. Interval visit in the CHF clinic noted. She is here today with her daughter. Overall no substantial change in functional status her symptomatology. As of her last visit in the CHF clinic Demadex was modified to 80 mg in the morning daily and 40 mg in the evening every other day. Her weight has crept back up, almost 10 pounds by our scales. She is still limiting salt intake, her daughter tells with this and medications. She continues to use supplemental oxygen. Has not been hospitalized. Leg edema has been fairly well controlled overall.  Follow-up lab work from December 2017 showed creatinine down from 1.8-1.5.  Today we went over her medications with plan to increase diuretics temporarily.  Past Medical History:  Diagnosis Date  . Arthritis   . Carotid artery disease (HCC)    a. Bilateral CEA; 50-60% bilateral ICA stenosis, 11/12  . Cellulitis    a. Recurrent, bilateral legs  . CHF (congestive heart failure) ([place])   . Chronic back pain   . Chronic diastolic heart failure ([place])   . Coronary atherosclerosis of native coronary artery    a. 11/2010 NSTEMI/CABG x 4: L-LAD; S-PL; S-OM; S-DX, by PVT.  [name] Critical lower limb ischemia   . Depression   . Essential hypertension, benign   . Herniated disc   . History of blood transfusion   . Ischemic cardiomyopathy    a. 11/2010 TEE: EF 40-45%.  . Migraine   . Mixed hyperlipidemia   . NSTEMI (non-ST elevated myocardial infarction) ([place]) 11/2010  . On home oxygen therapy   . PAD (peripheral artery disease) ([place])   . Pneumonia 2000's X 2  . Stroke ([place]) 08/2009  . Suicide attempt 08/2002  . TIA (transient ischemic  attack)   . TMJ syndrome   . Type 2 diabetes mellitus ([place])   . Venous insufficiency     Past Surgical History:  Procedure Laterality Date  . ABDOMINAL HYSTERECTOMY    . APPENDECTOMY    . CARDIAC CATHETERIZATION  11/2010  . CARDIAC CATHETERIZATION N/A 09/16/2015   Procedure: Right Heart Cath;  Surgeon: Larey Dresser, MD;  Location: [facility];  Service: Cardiovascular;  Laterality: N/A;  . CAROTID ENDARTERECTOMY Bilateral 2011   Bilateral - Dr. Kellie Simmering  . CHOLECYSTECTOMY    . COLONOSCOPY W/ BIOPSIES AND POLYPECTOMY    . CORONARY ANGIOPLASTY WITH STENT PLACEMENT  03/2013   "1"  . CORONARY ARTERY BYPASS GRAFT  11/24/2010   Procedure: CORONARY ARTERY BYPASS GRAFTING (CABG);  Surgeon: Tharon Aquas Adelene Idler, MD;  Location: [facility];  Service: Open Heart Surgery;  Laterality: N/A;  Coronary Artery Bypass Graft times four on pump utilizing left internal mammary artery and bilateral greater saphenous veins harvested endoscopically, transesophageal echocardiogram   . DEBRIDEMENT TOE Left    "nonhealing wound; 3rd digit  . DILATION AND CURETTAGE OF UTERUS    . ENDARTERECTOMY POPLITEAL Left 10/07/2013   Procedure: LEFT POPLITEAL ENDARTERECTOMY ;  Surgeon: Angelia Mould, MD;  Location: [facility];  Service: Vascular;  Laterality: Left;  . FEMORAL-POPLITEAL BYPASS GRAFT Left 10/07/2013   Procedure: LEFT FEMORAL-POPLITEAL ARTERY BYPASS GRAFT;  Surgeon: Angelia Mould, MD;  Location: [facility];  Service: Vascular;  Laterality: Left;  . FRACTURE SURGERY    . INTRAOPERATIVE ARTERIOGRAM Left 10/07/2013   Procedure: INTRA OPERATIVE ARTERIOGRAM LEFT LEG;  Surgeon: Angelia Mould, MD;  Location: [facility];  Service: Vascular;  Laterality: Left;  . IR GENERIC HISTORICAL  09/21/2015   IR REMOVAL OF PLURAL CATH W/CUFF 09/21/2015 Marybelle Killings, MD [facility]-INTERV RAD  . LEFT AND RIGHT HEART CATHETERIZATION WITH CORONARY/GRAFT ANGIOGRAM N/A 03/27/2013   Procedure: LEFT AND RIGHT HEART CATHETERIZATION WITH  Beatrix Fetters;  Surgeon: Burnell Blanks, MD;  Location: [facility];  Service: Cardiovascular;  Laterality: N/A;  . LOWER EXTREMITY ANGIOGRAM  09/08/2012; 10/06/2013   "found 100% blockage; unsuccessful attempt at crossing a chronic total occlusion of the left SFA in the setting of critical limb ischemia  . LOWER EXTREMITY ANGIOGRAM Bilateral 09/08/2013   Procedure: LOWER EXTREMITY ANGIOGRAM;  Surgeon: [name], MD;  Location: [facility] CATH LAB;  Service: Cardiovascular;  Laterality: Bilateral;  . PERIPHERAL VASCULAR CATHETERIZATION  11/04/2015   Procedure: Peripheral Vascular Atherectomy;  Surgeon: [name], MD;  Location: [facility];  Service: Cardiovascular;;  RT. SFA  . PERIPHERAL VASCULAR CATHETERIZATION Bilateral 11/04/2015   Procedure: Lower Extremity Intervention;  Surgeon: [name], MD;  Location: [facility];  Service: Cardiovascular;  Laterality: Bilateral;  . PERIPHERAL VASCULAR CATHETERIZATION  11/04/2015   Procedure: Peripheral Vascular Balloon Angioplasty;  Surgeon: [name], MD;  Location: [facility];  Service: Cardiovascular;;  TP Trunk  . TOE SURGERY Left    "put pin in 2nd toe"  . TUBAL LIGATION    . WRIST FRACTURE SURGERY Left    "grafted bone from hip to wrist"    Current Outpatient Prescriptions  Medication Sig Dispense Refill  . acetaminophen (TYLENOL) 500 MG tablet Take 500-1,000 mg by mouth every 6 (six) hours as needed for mild pain or moderate pain.     [name] albuterol (PROVENTIL HFA;VENTOLIN HFA) 108 (90 BASE) MCG/ACT inhaler Inhale 2 puffs into the lungs daily as needed for wheezing or shortness of breath.     [name] amLODipine (NORVASC) 10 MG tablet TAKE ONE TABLET BY MOUTH ONCE DAILY 90 tablet 3  . aspirin 81 MG EC tablet Take 81 mg by mouth daily.    [name] atorvastatin (LIPITOR) 40 MG tablet Take 40 mg by mouth daily at 6 PM.    . citalopram (CELEXA) 40 MG tablet Take 40 mg by mouth every morning.     .  clopidogrel (PLAVIX) 75 MG tablet TAKE ONE TABLET BY MOUTH ONCE DAILY WITH  BREAKFAST 30 tablet 6  . clotrimazole-betamethasone (LOTRISONE) cream Apply 1 application topically 2 (two) times daily as needed.    Mariane Baumgarten Calcium (STOOL SOFTENER PO) Take 200 mg by mouth at bedtime as needed (for constipation).     . gabapentin (NEURONTIN) 300 MG capsule Take 300-600 mg by mouth See admin instructions. 300 mg in the morning and 600 mg prior to bed(time)    . hydrALAZINE (APRESOLINE) 50 MG tablet Take 1.5 tablets (75 mg total) by mouth 3 (three) times daily. 100 tablet 3  . HYDROcodone-acetaminophen (NORCO/VICODIN) 5-325 MG tablet Take 1 tablet by mouth 3 (three) times daily as needed.    . insulin aspart (NOVOLOG) 100 UNIT/ML injection Inject 3 Units into the skin 3 (three) times daily with meals. (Patient taking differently: Inject 3-7 Units into the skin 3 (three) times daily after  meals. Per sliding scale) 10 mL 11  . insulin detemir (LEVEMIR) 100 UNIT/ML injection Inject 7 Units into the skin at bedtime.    . isosorbide mononitrate (IMDUR) 30 MG 24 hr tablet TAKE ONE TABLET BY MOUTH ONCE DAILY 30 tablet 3  . Multiple Vitamin (MULTIVITAMIN WITH MINERALS) TABS tablet Take 1 tablet by mouth daily.    . nitroGLYCERIN (NITROSTAT) 0.4 MG SL tablet Place 1 tablet (0.4 mg total) under the tongue every 5 (five) minutes as needed for chest pain. Up to 3 doses. If no relief after 3rd dose, proceed to the ED for an evaluation 25 tablet 3  . nystatin (MYCOSTATIN/NYSTOP) powder Apply 1 application topically 2 (two) times daily as needed.    . potassium chloride SA (K-DUR,KLOR-CON) 20 MEQ tablet Take 1 tablet (20 mEq total) by mouth daily. 30 tablet 3  . spironolactone (ALDACTONE) 25 MG tablet Take 0.5 tablets (12.5 mg total) by mouth daily. 16 tablet 6  . tiZANidine (ZANAFLEX) 4 MG tablet Take 4 mg by mouth 2 (two) times daily as needed for muscle spasms.    [name] torsemide (DEMADEX) 20 MG tablet Take 80 mg(4 tabs)  in the AM and 40 mg (2 tabs) in the PM every OTHER day 150 tablet 3  . umeclidinium-vilanterol (ANORO ELLIPTA) 62.5-25 MCG/INH AEPB Inhale 1 puff into the lungs daily.    [name] torsemide (DEMADEX) 20 MG tablet Take 10m (4 tabs) by mouth every morning & 444m(2 tabs) every evening x 2 weeks, then resume previous dosing. 84 tablet 0   No current facility-administered medications for this visit.    Allergies:  Other; Strawberry extract; Sulfa antibiotics; Coffee bean extract [coffea arabica]; and Tape   Social History: The patient  reports that she quit smoking about 6 years ago. Her smoking use included Cigarettes. She started smoking about 60 years ago. She has a 150.00 pack-year smoking history. She has never used smokeless tobacco. She reports that she drinks alcohol. She reports that she uses drugs, including Marijuana.   ROS:  Please see the history of present illness. Otherwise, complete review of systems is positive for mild leg edema.  All other systems are reviewed and negative.   Physical Exam: VS:  BP (!) 153/61   Pulse (!) 52   Ht _0  (1.676 m)   Wt 191 lb 3.2 oz (86.7 kg)   SpO2 92% Comment: o2 3l via [place]  BMI 30.86 kg/m , BMI Body mass index is 30.86 kg/m.  Wt Readings from Last 3 Encounters:  01/28/16 191 lb 3.2 oz (86.7 kg)  01/07/16 183 lb (83 kg)  01/05/16 190 lb (86.2 kg)    Chronically ill-appearing woman, no distress. Wearing oxygen. HEENT: Conjunctiva and lids normal, oropharynx clear. Neck: Supple, elevated JVP, bilateral CEA scars, no thyromegaly. Lungs: Scattered basilar rhonchi and decreased breath sounds at the very bases, nonlabored breathing at rest. Cardiac:Regular rate and rhythm, no S3, soft systolic murmur, no pericardial rub. Abdomen: Bowel sounds present, no guarding or rebound. Extremities: 1+ ankle edema and venous stasis, distal pulses 1+. Skin: Warm and dry. Musculoskeletal: No kyphosis. Neuropsychiatric: Alert and oriented 3, affect  appropriate.  ECG: I personally reviewed the tracing from 10/19/2015 which showed sinus rhythm with old anterior infarct pattern.  Recent Labwork: 09/14/2015: ALT 18; AST 20 09/20/2015: Magnesium 3.0 11/25/2015: B Natriuretic Peptide 747.2 01/07/2016: BUN 61; Creatinine, Ser 1.53; Hemoglobin 11.5; Platelets 197; Potassium 4.1; Sodium 130 01/17/2016: TSH 4.69     Component Value Date/Time  CHOL 78 11/15/2015 1114   TRIG 102 11/15/2015 1114   HDL 22 (L) 11/15/2015 1114   CHOLHDL 3.5 11/15/2015 1114   VLDL 20 11/15/2015 1114   LDLCALC 36 11/15/2015 1114    Other Studies Reviewed Today:  Echocardiogram 07/24/2015: Study Conclusions  - Left ventricle: The cavity size was normal. There was moderate concentric hypertrophy. Systolic function was normal. The estimated ejection fraction was in the range of 55% to 60%. Wall motion was normal; there were no regional wall motion abnormalities. Features are consistent with a pseudonormal left ventricular filling pattern, with concomitant abnormal relaxation and increased filling pressure (grade 2 diastolic dysfunction). Doppler parameters are consistent with elevated ventricular end-diastolic filling pressure. - Ventricular septum: The contour showed diastolic flattening and systolic flattening. - Aortic valve: Trileaflet; normal thickness leaflets. There was trivial regurgitation. - Aortic root: The aortic root was normal in size. - Ascending aorta: The ascending aorta was normal in size. - Mitral valve: Structurally normal valve. There was mild regurgitation. - Left atrium: The atrium was moderately dilated. - Right ventricle: Systolic function was normal. - Right atrium: The atrium was normal in size. - Tricuspid valve: There was mild regurgitation. - Pulmonic valve: There was trivial regurgitation. - Pulmonary arteries: Systolic pressure was moderately increased. PA peak pressure: 52 mm Hg (S). - Inferior  vena cava: The vessel was normal in size. - Pericardium, extracardiac: There was no pericardial effusion.  Assessment and Plan:  1. Acute on chronic diastolic heart failure with RV failure also contributing. She has evidence of volume overload. Increase Demadex to 80 mg in the morning and 40 mg in the evening every day for the next 2 weeks, then try and cut back to the prior dose with 40 mg taken in the evening every other day. She has follow-up pending in the CHF clinic after that with follow-up BMET. She will let us know if there is further clinical deterioration in the meanwhile.  2. CKD stage 3, last creatinine 1.5.  3. CAD status post CABG with vein graft intervention in 2015. No active angina symptoms on medical therapy.  4. Hyperlipidemia, continues on statin therapy with LDL at goal.  Current medicines were reviewed with the patient today.  Disposition: Follow-up in 2 months.  Signed, Satira Sark, MD, [facility] 01/28/2016 3:58 PM    [facility] [place], [place], [place] [address] Phone: [phone]; Fax: [phone]

## 2016-02-04 ENCOUNTER — Other Ambulatory Visit (HOSPITAL_COMMUNITY): Payer: Self-pay | Admitting: Student

## 2016-02-04 DIAGNOSIS — E1122 Type 2 diabetes mellitus with diabetic chronic kidney disease: Secondary | ICD-10-CM | POA: Diagnosis not present

## 2016-02-04 DIAGNOSIS — N182 Chronic kidney disease, stage 2 (mild): Secondary | ICD-10-CM | POA: Diagnosis not present

## 2016-02-04 DIAGNOSIS — E1142 Type 2 diabetes mellitus with diabetic polyneuropathy: Secondary | ICD-10-CM | POA: Diagnosis not present

## 2016-02-04 DIAGNOSIS — I6529 Occlusion and stenosis of unspecified carotid artery: Secondary | ICD-10-CM | POA: Diagnosis not present

## 2016-02-04 DIAGNOSIS — Z6831 Body mass index (BMI) 31.0-31.9, adult: Secondary | ICD-10-CM | POA: Diagnosis not present

## 2016-02-17 ENCOUNTER — Encounter: Payer: Self-pay | Admitting: Vascular Surgery

## 2016-02-21 DIAGNOSIS — I509 Heart failure, unspecified: Secondary | ICD-10-CM | POA: Diagnosis not present

## 2016-02-21 DIAGNOSIS — I639 Cerebral infarction, unspecified: Secondary | ICD-10-CM | POA: Diagnosis not present

## 2016-02-21 DIAGNOSIS — K3184 Gastroparesis: Secondary | ICD-10-CM | POA: Diagnosis not present

## 2016-02-21 DIAGNOSIS — E1122 Type 2 diabetes mellitus with diabetic chronic kidney disease: Secondary | ICD-10-CM | POA: Diagnosis not present

## 2016-02-21 DIAGNOSIS — Z299 Encounter for prophylactic measures, unspecified: Secondary | ICD-10-CM | POA: Diagnosis not present

## 2016-02-21 DIAGNOSIS — M5440 Lumbago with sciatica, unspecified side: Secondary | ICD-10-CM | POA: Diagnosis not present

## 2016-02-21 DIAGNOSIS — G20 Parkinson's disease: Secondary | ICD-10-CM | POA: Diagnosis not present

## 2016-02-21 DIAGNOSIS — Z6831 Body mass index (BMI) 31.0-31.9, adult: Secondary | ICD-10-CM | POA: Diagnosis not present

## 2016-02-21 DIAGNOSIS — Z79891 Long term (current) use of opiate analgesic: Secondary | ICD-10-CM | POA: Diagnosis not present

## 2016-02-21 DIAGNOSIS — L98499 Non-pressure chronic ulcer of skin of other sites with unspecified severity: Secondary | ICD-10-CM | POA: Diagnosis not present

## 2016-02-21 DIAGNOSIS — E1143 Type 2 diabetes mellitus with diabetic autonomic (poly)neuropathy: Secondary | ICD-10-CM | POA: Diagnosis not present

## 2016-02-21 DIAGNOSIS — E1142 Type 2 diabetes mellitus with diabetic polyneuropathy: Secondary | ICD-10-CM | POA: Diagnosis not present

## 2016-02-23 ENCOUNTER — Ambulatory Visit (HOSPITAL_COMMUNITY)
Admission: RE | Admit: 2016-02-23 | Discharge: 2016-02-23 | Disposition: A | Discharge status: Home / Self Care | Payer: Medicare Other | Source: Ambulatory Visit | Attending: Vascular Surgery | Admitting: Vascular Surgery

## 2016-02-23 ENCOUNTER — Encounter: Payer: Self-pay | Admitting: Family

## 2016-02-23 ENCOUNTER — Ambulatory Visit (INDEPENDENT_AMBULATORY_CARE_PROVIDER_SITE_OTHER): Payer: Medicare Other | Admitting: Family

## 2016-02-23 VITALS — BP 140/63 | HR 44 | Temp 97.0°F | Resp 20 | Ht 66.0 in | Wt 185.0 lb

## 2016-02-23 DIAGNOSIS — Z9889 Other specified postprocedural states: Secondary | ICD-10-CM

## 2016-02-23 DIAGNOSIS — Z95828 Presence of other vascular implants and grafts: Secondary | ICD-10-CM

## 2016-02-23 DIAGNOSIS — I739 Peripheral vascular disease, unspecified: Secondary | ICD-10-CM | POA: Insufficient documentation

## 2016-02-23 DIAGNOSIS — I6523 Occlusion and stenosis of bilateral carotid arteries: Secondary | ICD-10-CM

## 2016-02-23 DIAGNOSIS — I779 Disorder of arteries and arterioles, unspecified: Secondary | ICD-10-CM

## 2016-02-23 NOTE — Patient Instructions (Signed)

## 2016-02-23 NOTE — Progress Notes (Signed)
[facility] [place]   CC: Follow up peripheral artery occlusive disease  History of Present Illness [name] [name] is a 71 y.o. female who is s/p left femoral to popliteal bypass graft on 10/08/15 with popliteal endarterectomy by Dr. Scot Dock.    She had bilateral CEA in 2011, had a a TIA prior to this, none subsequently per pt.   Aortogram done by Dr. Gwenlyn Found in August 2015 results as follows: 1: Abdominal aortogram-the distal abdominal aorta was free of significant disease  2: Left lower extremity-left SFA was occluded just beyond the origin of the profunda takeoff and reconstituted in the adductor canal. There was moderate popliteal disease with two-vessel runoff. The anterior tibial is occluded, the posterior tibial was diseased and the peroneal was the dominant vessel.  3: Right lower extremity-the right SFA had a 90% fairly focal stenosis the midportion followed by a 50% segmental stenosis just beyond that. There is 1 vessel runoff via the peroneal.  She has had a Pleurex catheter placed and removed since her last visit.  She continues to be on home O2.   She is on a daily aspirin.  She is on a beta blocker, CCB and ACEI for blood pressure control.  She is on insulin for diabetes.     She was last evaluated by S. Ryne PA-C on 02-17-15. At that time pt doing well and not having any rest pain. She had a wound on her right 2nd toe that had been present for a while, but pt and daughter state it has long since healed.  Arteriogram by Dr. Gwenlyn Found revealed right SFA had a 90% fairly focal stenosis the midportion followed by a 50% segmental stenosis just beyond that. There was 1 vessel runoff via the peroneal.  She had an appointment with Dr. Gwenlyn Found that she kept.   Pt was to return in one year for duplex of bypass graft.  Dr. Gwenlyn Found was to continue to follow ABI's.  Her walking is not limited by her dyspnea as much as her DM neuropathy pain in her feet and left leg. She walks  around her large living room 6 times daily. She is also doing daily daily seated leg exercises as taught to her by home physical therapy.   Pt Diabetic: Yes, daughter states last A1C was 7.?, managed by Dr. Woody Seller Pt smoker: former smoker, quit about 2010, started at age 89  Pt meds include: Statin :Yes Betablocker: Yes ASA: Yes Other anticoagulants/antiplatelets: Plavix   Past Medical History:  Diagnosis Date  . Arthritis   . Carotid artery disease (HCC)    a. Bilateral CEA; 50-60% bilateral ICA stenosis, 11/12  . Cellulitis    a. Recurrent, bilateral legs  . CHF (congestive heart failure) ([place])   . Chronic back pain   . Chronic diastolic heart failure ([place])   . Coronary atherosclerosis of native coronary artery    a. 11/2010 NSTEMI/CABG x 4: L-LAD; S-PL; S-OM; S-DX, by PVT.  [name] Critical lower limb ischemia   . Depression   . Essential hypertension, benign   . Herniated disc   . History of blood transfusion   . Ischemic cardiomyopathy    a. 11/2010 TEE: EF 40-45%.  . Migraine   . Mixed hyperlipidemia   . NSTEMI (non-ST elevated myocardial infarction) ([place]) 11/2010  . On home oxygen therapy   . PAD (peripheral artery disease) ([place])   . Pneumonia 2000's X 2  . Stroke ([place]) 08/2009  . Suicide attempt 08/2002  .  TIA (transient ischemic attack)   . TMJ syndrome   . Type 2 diabetes mellitus ([place])   . Venous insufficiency     Social History Social History  Substance Use Topics  . Smoking status: Former Smoker    Packs/day: 3.00    Years: 50.00    Types: Cigarettes    Start date: 01/24/1956    Quit date: 10/09/2009  . Smokeless tobacco: Never Used  . Alcohol use 0.0 oz/week     Comment: 10/06/2013 "might have a drink q 2-3 months"    Family History Family History  Problem Relation Age of Onset  . Coronary artery disease Father     Died with MI age 75  . Heart disease Father     before age 13  . Heart attack Father   . Diabetes type II Mother   . Hypertension  Mother   . Diabetes Mother   . Heart disease Mother   . Hyperlipidemia Mother   . Heart failure Sister   . Cancer Sister   . Cancer Brother     Lung cancer  . Diabetes Daughter   . Hyperlipidemia Daughter   . Diabetes Son   . Hyperlipidemia Son     Past Surgical History:  Procedure Laterality Date  . ABDOMINAL HYSTERECTOMY    . APPENDECTOMY    . CARDIAC CATHETERIZATION  11/2010  . CARDIAC CATHETERIZATION N/A 09/16/2015   Procedure: Right Heart Cath;  Surgeon: Larey Dresser, MD;  Location: [facility];  Service: Cardiovascular;  Laterality: N/A;  . CAROTID ENDARTERECTOMY Bilateral 2011   Bilateral - Dr. Kellie Simmering  . CHOLECYSTECTOMY    . COLONOSCOPY W/ BIOPSIES AND POLYPECTOMY    . CORONARY ANGIOPLASTY WITH STENT PLACEMENT  03/2013   "1"  . CORONARY ARTERY BYPASS GRAFT  11/24/2010   Procedure: CORONARY ARTERY BYPASS GRAFTING (CABG);  Surgeon: Tharon Aquas Adelene Idler, MD;  Location: [facility];  Service: Open Heart Surgery;  Laterality: N/A;  Coronary Artery Bypass Graft times four on pump utilizing left internal mammary artery and bilateral greater saphenous veins harvested endoscopically, transesophageal echocardiogram   . DEBRIDEMENT TOE Left    "nonhealing wound; 3rd digit  . DILATION AND CURETTAGE OF UTERUS    . ENDARTERECTOMY POPLITEAL Left 10/07/2013   Procedure: LEFT POPLITEAL ENDARTERECTOMY ;  Surgeon: Angelia Mould, MD;  Location: [facility];  Service: Vascular;  Laterality: Left;  . FEMORAL-POPLITEAL BYPASS GRAFT Left 10/07/2013   Procedure: LEFT FEMORAL-POPLITEAL ARTERY BYPASS GRAFT;  Surgeon: Angelia Mould, MD;  Location: [facility];  Service: Vascular;  Laterality: Left;  . FRACTURE SURGERY    . INTRAOPERATIVE ARTERIOGRAM Left 10/07/2013   Procedure: INTRA OPERATIVE ARTERIOGRAM LEFT LEG;  Surgeon: Angelia Mould, MD;  Location: [facility];  Service: Vascular;  Laterality: Left;  . IR GENERIC HISTORICAL  09/21/2015   IR REMOVAL OF PLURAL CATH W/CUFF 09/21/2015 Marybelle Killings, MD [facility]-INTERV RAD  . LEFT AND RIGHT HEART CATHETERIZATION WITH CORONARY/GRAFT ANGIOGRAM N/A 03/27/2013   Procedure: LEFT AND RIGHT HEART CATHETERIZATION WITH Beatrix Fetters;  Surgeon: Burnell Blanks, MD;  Location: [facility];  Service: Cardiovascular;  Laterality: N/A;  . LOWER EXTREMITY ANGIOGRAM  09/08/2012; 10/06/2013   "found 100% blockage; unsuccessful attempt at crossing a chronic total occlusion of the left SFA in the setting of critical limb ischemia  . LOWER EXTREMITY ANGIOGRAM Bilateral 09/08/2013   Procedure: LOWER EXTREMITY ANGIOGRAM;  Surgeon: [name], MD;  Location: [facility] CATH LAB;  Service: Cardiovascular;  Laterality: Bilateral;  . PERIPHERAL VASCULAR CATHETERIZATION  11/04/2015   Procedure: Peripheral Vascular Atherectomy;  Surgeon: [name], MD;  Location: [facility];  Service: Cardiovascular;;  RT. SFA  . PERIPHERAL VASCULAR CATHETERIZATION Bilateral 11/04/2015   Procedure: Lower Extremity Intervention;  Surgeon: [name], MD;  Location: [facility];  Service: Cardiovascular;  Laterality: Bilateral;  . PERIPHERAL VASCULAR CATHETERIZATION  11/04/2015   Procedure: Peripheral Vascular Balloon Angioplasty;  Surgeon: [name], MD;  Location: [facility];  Service: Cardiovascular;;  TP Trunk  . TOE SURGERY Left    "put pin in 2nd toe"  . TUBAL LIGATION    . WRIST FRACTURE SURGERY Left    "grafted bone from hip to wrist"    Allergies  Allergen Reactions  . Other Shortness Of Breath, Rash and Other (See Comments)    CANNOT EAT ANY BERRIES!!  . Strawberry Extract Hives, Shortness Of Breath and Rash  . Sulfa Antibiotics Swelling and Other (See Comments)    Bodily Swelling  . Coffee Bean Extract [Coffea Arabica] Itching and Rash  . Tape Other (See Comments)    Tears skin.  Please use "paper" tape only.    Current Outpatient Prescriptions  Medication Sig Dispense Refill  . acetaminophen (TYLENOL) 500 MG  tablet Take 500-1,000 mg by mouth every 6 (six) hours as needed for mild pain or moderate pain.     [name] albuterol (PROVENTIL HFA;VENTOLIN HFA) 108 (90 BASE) MCG/ACT inhaler Inhale 2 puffs into the lungs daily as needed for wheezing or shortness of breath.     [name] amLODipine (NORVASC) 10 MG tablet TAKE ONE TABLET BY MOUTH ONCE DAILY 90 tablet 3  . aspirin 81 MG EC tablet Take 81 mg by mouth daily.    [name] atorvastatin (LIPITOR) 40 MG tablet Take 40 mg by mouth daily at 6 PM.    . citalopram (CELEXA) 40 MG tablet Take 40 mg by mouth every morning.     . clopidogrel (PLAVIX) 75 MG tablet TAKE ONE TABLET BY MOUTH ONCE DAILY WITH  BREAKFAST 30 tablet 6  . clotrimazole-betamethasone (LOTRISONE) cream Apply 1 application topically 2 (two) times daily as needed.    Mariane Baumgarten Calcium (STOOL SOFTENER PO) Take 200 mg by mouth at bedtime as needed (for constipation).     . gabapentin (NEURONTIN) 300 MG capsule Take 300-600 mg by mouth See admin instructions. 300 mg in the morning and 600 mg prior to bed(time)    . hydrALAZINE (APRESOLINE) 50 MG tablet TAKE ONE & ONE-HALF TABLETS BY MOUTH THREE TIMES DAILY 100 tablet 3  . HYDROcodone-acetaminophen (NORCO/VICODIN) 5-325 MG tablet Take 1 tablet by mouth 3 (three) times daily as needed.    . insulin aspart (NOVOLOG) 100 UNIT/ML injection Inject 3 Units into the skin 3 (three) times daily with meals. (Patient taking differently: Inject 3-7 Units into the skin 3 (three) times daily after meals. Per sliding scale) 10 mL 11  . insulin detemir (LEVEMIR) 100 UNIT/ML injection Inject 7 Units into the skin at bedtime.    . isosorbide mononitrate (IMDUR) 30 MG 24 hr tablet TAKE ONE TABLET BY MOUTH ONCE DAILY 30 tablet 3  . Multiple Vitamin (MULTIVITAMIN WITH MINERALS) TABS tablet Take 1 tablet by mouth daily.    . nitroGLYCERIN (NITROSTAT) 0.4 MG SL tablet Place 1 tablet (0.4 mg total) under the tongue every 5 (five) minutes as needed for chest pain. Up to 3 doses. If no relief  after 3rd dose,  proceed to the ED for an evaluation 25 tablet 3  . nystatin (MYCOSTATIN/NYSTOP) powder Apply 1 application topically 2 (two) times daily as needed.    . potassium chloride SA (K-DUR,KLOR-CON) 20 MEQ tablet Take 1 tablet (20 mEq total) by mouth daily. 30 tablet 3  . spironolactone (ALDACTONE) 25 MG tablet Take 0.5 tablets (12.5 mg total) by mouth daily. 16 tablet 6  . tiZANidine (ZANAFLEX) 4 MG tablet Take 4 mg by mouth 2 (two) times daily as needed for muscle spasms.    [name] torsemide (DEMADEX) 20 MG tablet Take 80 mg(4 tabs) in the AM and 40 mg (2 tabs) in the PM every OTHER day 150 tablet 3  . umeclidinium-vilanterol (ANORO ELLIPTA) 62.5-25 MCG/INH AEPB Inhale 1 puff into the lungs daily.     No current facility-administered medications for this visit.     ROS: See HPI for pertinent positives and negatives.   Physical Examination  Vitals:   02/23/16 1545  BP: 140/63  Pulse: (!) 44  Resp: 20  Temp: 97 F (36.1 C)  TempSrc: Oral  SpO2: 93%  Weight: 185 lb (83.9 kg)  Height: _0  (1.676 m)   Body mass index is 29.86 kg/m.  General: A&O x 3, WDWN, female. Gait: walking slowly with rolling walker Eyes: PERRLA. Pulmonary: Respirations are non labored, rales in right lower half of posterior fields, no wheezes or rhonchi. Pt with nasal canula connected to O2 tank. Cardiac: regular rhythm, no detected murmur.         Carotid Bruits Right Left   Negative Negative  Aorta is not palpable. Radial pulses: 2+ palpable                           VASCULAR EXAM: Extremities without ischemic changes,  without Gangrene; without open wounds. Hemosiderin deposits both lower legs with mild venous stasis dermatitis. Dry skin. Trace bilateral pretibial pitting edema.                                                                                                          LE Pulses Right Left       FEMORAL  not palpable sitting up  not palpable        POPLITEAL  not palpable    not palpable       POSTERIOR TIBIAL  faintly palpable   2+ palpable        DORSALIS PEDIS      ANTERIOR TIBIAL faintly palpable  2+ palpable    Abdomen: soft, NT, no palpable masses. Skin: no rashes, no ulcers. Musculoskeletal: no muscle wasting or atrophy.  Neurologic: A&O X 3; Appropriate Affect ; SENSATION: normal; MOTOR FUNCTION:  moving all extremities equally, motor strength 5/5 throughout. Speech is fluent/normal. CN 2-12 intact.    ASSESSMENT: [name] [name] is a 71 y.o. female with hx of left femoral to popliteal bypass grafting with PTFE and popliteal endarterectomy on 10/07/13. She also has a hx of bilateral CEA in 2011, had a TIA before the  CEA in 2011, none subsequently.  Her walking is limited by painful DM neuropathy in both feet and left leg, but she manages to do considerable walking in her home and daily seated leg exercises.  She has no signs of ischemia in her feet/legs, does have mild venous stasis dermatitis both lower legs. Has chronic CHF.   DATA Today's left LE arterial duplex demonstrates left fem-pop bypass graft with no stenosis. Increased velocity in inflow (286 cm/s) and proximal anastomosis (330 cm/s) With heterogenous plaque noted in the inflow artery, no significant plaque visualized in the proximal anastomosis.  No significant change compared to the last exam on 11-19-15 at [facility]. ABI at that time was 0.70 right and 0.90 left.   Carotid duplex 03-09-13 requested by Dr. Gwenlyn Found: 50-69% bilateral ICA stenoses  PLAN:  Continued graduated walking program and daily seated leg exercises.  Based on the patient's vascular studies and examination, pt will return to clinic in 1 year with carotid duplex and left LE arterial duplex, ABI's to be continued to be followed by Dr. Gwenlyn Found.  I advised pt and her daughter to notify us if she develops non healing wounds in her feel/legs or any concerns about the circulation in her legs.   I discussed in depth with the  patient the nature of atherosclerosis, and emphasized the importance of maximal medical management including strict control of blood pressure, blood glucose, and lipid levels, obtaining regular exercise, and continued cessation of smoking.  The patient is aware that without maximal medical management the underlying atherosclerotic disease process will progress, limiting the benefit of any interventions.  The patient was given information about PAD including signs, symptoms, treatment, what symptoms should prompt the patient to seek immediate medical care, and risk reduction measures to take.  Clemon Chambers, RN, MSN, FNP-C [facility] Phone: [phone]  Clinic MD: Donzetta Matters  02/23/16 4:12 PM

## 2016-03-02 NOTE — Addendum Note (Signed)
Addended by: Lianne Cure A on: 03/02/2016 03:39 PM   Modules accepted: Orders

## 2016-03-16 ENCOUNTER — Ambulatory Visit (HOSPITAL_COMMUNITY)
Admission: RE | Admit: 2016-03-16 | Discharge: 2016-03-16 | Disposition: A | Discharge status: Home / Self Care | Payer: Medicare Other | Source: Ambulatory Visit | Attending: Cardiology | Admitting: Cardiology

## 2016-03-16 ENCOUNTER — Encounter (HOSPITAL_COMMUNITY): Payer: Self-pay

## 2016-03-16 VITALS — BP 128/60 | HR 56 | Wt 201.0 lb

## 2016-03-16 DIAGNOSIS — I130 Hypertensive heart and chronic kidney disease with heart failure and stage 1 through stage 4 chronic kidney disease, or unspecified chronic kidney disease: Secondary | ICD-10-CM | POA: Diagnosis not present

## 2016-03-16 DIAGNOSIS — N183 Chronic kidney disease, stage 3 unspecified: Secondary | ICD-10-CM

## 2016-03-16 DIAGNOSIS — E1122 Type 2 diabetes mellitus with diabetic chronic kidney disease: Secondary | ICD-10-CM | POA: Diagnosis not present

## 2016-03-16 DIAGNOSIS — E0590 Thyrotoxicosis, unspecified without thyrotoxic crisis or storm: Secondary | ICD-10-CM | POA: Insufficient documentation

## 2016-03-16 DIAGNOSIS — Z9889 Other specified postprocedural states: Secondary | ICD-10-CM | POA: Insufficient documentation

## 2016-03-16 DIAGNOSIS — Z87891 Personal history of nicotine dependence: Secondary | ICD-10-CM | POA: Diagnosis not present

## 2016-03-16 DIAGNOSIS — Z7902 Long term (current) use of antithrombotics/antiplatelets: Secondary | ICD-10-CM | POA: Insufficient documentation

## 2016-03-16 DIAGNOSIS — I739 Peripheral vascular disease, unspecified: Secondary | ICD-10-CM | POA: Diagnosis not present

## 2016-03-16 DIAGNOSIS — E785 Hyperlipidemia, unspecified: Secondary | ICD-10-CM | POA: Insufficient documentation

## 2016-03-16 DIAGNOSIS — I5032 Chronic diastolic (congestive) heart failure: Secondary | ICD-10-CM | POA: Insufficient documentation

## 2016-03-16 DIAGNOSIS — Z951 Presence of aortocoronary bypass graft: Secondary | ICD-10-CM | POA: Diagnosis not present

## 2016-03-16 DIAGNOSIS — I2510 Atherosclerotic heart disease of native coronary artery without angina pectoris: Secondary | ICD-10-CM | POA: Diagnosis not present

## 2016-03-16 DIAGNOSIS — I5042 Chronic combined systolic (congestive) and diastolic (congestive) heart failure: Secondary | ICD-10-CM

## 2016-03-16 DIAGNOSIS — E1151 Type 2 diabetes mellitus with diabetic peripheral angiopathy without gangrene: Secondary | ICD-10-CM | POA: Diagnosis not present

## 2016-03-16 LAB — BASIC METABOLIC PANEL
Anion gap: 8 (ref 5–15)
BUN: 67 mg/dL — AB (ref 6–20)
CHLORIDE: 95 mmol/L — AB (ref 101–111)
CO2: 29 mmol/L (ref 22–32)
CREATININE: 1.67 mg/dL — AB (ref 0.44–1.00)
Calcium: 8.9 mg/dL (ref 8.9–10.3)
GFR calc non Af Amer: 30 mL/min — ABNORMAL LOW (ref >60)
GFR, EST AFRICAN AMERICAN: 35 mL/min — AB (ref >60)
Glucose, Bld: 321 mg/dL — ABNORMAL HIGH (ref 65–99)
Potassium: 4.2 mmol/L (ref 3.5–5.1)
Sodium: 132 mmol/L — ABNORMAL LOW (ref 135–145)

## 2016-03-16 LAB — BRAIN NATRIURETIC PEPTIDE: B Natriuretic Peptide: 542.5 pg/mL — ABNORMAL HIGH (ref 0.0–100.0)

## 2016-03-16 MED ORDER — POTASSIUM CHLORIDE CRYS ER 20 MEQ PO TBCR: 20.0000 meq | EXTENDED_RELEASE_TABLET | Freq: Two times a day (BID) | ORAL | 3 refills | Status: DC

## 2016-03-16 MED ORDER — TORSEMIDE 20 MG PO TABS: ORAL_TABLET | ORAL | 3 refills | Status: DC

## 2016-03-16 NOTE — Patient Instructions (Signed)
Increase Toresmide to 80 mg (4 tabs) Twice daily FOR 5 DAYS ONLY, then take 80 mg in AM and 40 mg in PM  Increase Potassium to 20 meq Twice daily   Labs today  Labs in 10 days  Your physician recommends that you schedule a follow-up appointment in: 2 weeks

## 2016-03-16 NOTE — Progress Notes (Signed)
PCP: Dr. Woody Seller Cardiology: Dr. Domenic Polite HF Cardiology: Dr. Aundra Dubin  71 yo with history of chronic diastolic CHF, CAD s/p CABG, chronic right pleural effusion, CKD stage III, and PAD presents for CHF clinic evaluation.  She has had multiple admissions with diastolic CHF complicated by CKD.  She was admitted in 7/17 for diuresis.  Worsening renal function was noted.  Lisinopril was stopped with elevated creatinine and Coreg was stopped with bradycardia.  Last echo in 7/17 showed EF 55-60% with grade II diastolic dysfunction and D-shaped interventricular septum.  She has been wearing home oxygen for > 1 year.    She was admitted again in 9/17 with volume overload.  She was diuresed extensively.  Pleurx catheter was removed from right chest.  RHC showed elevated right and left heart filling pressure with pulmonary venous hypertension.  She was diagnosed with hyperthyroidism and started on methimazole.  She was seen by Dr Dorris Fetch (endocrinology) and thought to have subacute thyroiditis rather than Graves disease.  She is now off methimazole and thyroid indices have normalized.   After discharge, in 10/17 she had peripheral angiography PV => right SFA and right tibioperoneal trunk angioplasties.  She now has only rare claudication.     Weight is up 18 lbs compared to prior visit.  She has been using a walker for 4-5 years for balance.  Not very active but denies dyspnea walking around her house.  No chest pain.  No orthopnea/PND.  No lightheadedness.  Rare claudication-type pain.  She was seen recently by [facility] for PAD, thought to be stable.  Feels like her abdomen is distended.   Labs (7/17): K 4.2, creatinine 2.04 Labs (10/17): K 4.5, creatinine 1.68, HCT 30.5 Labs (11/17): K 3.9, creatinine 1.78, LDL 36 Labs (12/17): K 3.9, creatinine 1.49 => 1.53  PMH: 1. Chronic diastolic CHF: Multiple admissions.  - Echo (7/17) with EF 55-60%, grade II diastolic dysfunction, D-shaped interventricular septum, mild MR,  PASP 52 mmHg.  - Uses home oxygen.  - RHC (9/17): mean 15, PA 73/26 mean 42, mean PCWP 25, CI 2.84, PVR 3.0 WU => pulmonary venous hypertension.  2. H/o CVA 3. Recurrent right pleural effusion: S/p Pleurx catheter placement.  4. Depression 5. HTN 6. Carotid stenosis: Bilateral CEAs 7. Type II diabetes.  8. CAD: NSTEMI 11/12, had CABG with LIMA-LAD, SVG-PLV, SVG-OM, SVG-D.   - LHC (9/15) with DES to SVG-D.  Other grafts patent.  9. PAD: Left femoral-popliteal bypass 9/15.  - peripheral arterial dopplers (2/17) with right ABI 0.53, left ABI 1.02.  - 10/17 right SFA atherectomy and right tibioperoneal trunk atherectomy.  10. CKD stage III. Lisinopril stopped with increased creatinine.  11. Bradycardia: Stopped Coreg.  12. Hyperthyroidism: Diagnosed in 9/17. Subacute thyroiditis.  Followed by Dr. Dorris Fetch.  13. Sleep study 12/17 without significant OSA.   SH: Lives in [place] with son.  Prior smoker, quit 2012.  Retired.   Family History  Problem Relation Age of Onset  . Coronary artery disease Father     Died with MI age 45  . Heart disease Father     before age 38  . Heart attack Father   . Diabetes type II Mother   . Hypertension Mother   . Diabetes Mother   . Heart disease Mother   . Hyperlipidemia Mother   . Heart failure Sister   . Cancer Sister   . Cancer Brother     Lung cancer  . Diabetes Daughter   . Hyperlipidemia Daughter   .  Diabetes Son   . Hyperlipidemia Son    ROS: All systems reviewed and negative except as per HPI.   BP 128/60   Pulse (!) 56   Wt 201 lb (91.2 kg)   SpO2 90% Comment: on 4L of O2  BMI 32.44 kg/m  General: NAD Neck: JVP 10-12 cm, no thyromegaly or thyroid nodule.  Lungs: Crackles at bases bilaterally.  CV: Nondisplaced PMI.  Heart regular S1/S2, no S3/S4, 1/6 SEM RUSB. 1+ edema 1/2 to knees bilaterally.  No carotid bruit. Difficult to palpate pedal pulses.  Abdomen: Soft, nontender, no hepatosplenomegaly, no distention.  Skin: Intact without  lesions or rashes.  Neurologic: Alert and oriented x 3. Tremor noted.  Psych: Normal affect. Extremities: No clubbing/cyanosis.   HEENT: Normal.   Assessment/plan: 1. Chronic diastolic CHF: I suspect that there is a significant component of RV failure as well.  She is volume overloaded on exam with 18 lb weight gain. - Increase torsemide to 80 mg bid x 5 days then 80 qam/40 qpm.  Increase KCl to 20 bid.  Check BMET/BNP today and again in 10 days.   - She is unable to get compression stockings on.  2. CKD stage III: BMET today and in 10 days.   3. CAD: s/p CABG.  No chest pain.  H/o PCI to SVG-D in 9/15.  - Continue ASA 81, Plavix, statin.  4. Recurrent right pleural effusion: Pleurx catheter removed in 9/17. 5. PAD: s/p recent right leg atherectomies. Minimal claudication.  No sores on legs/feet.   7. Hyperlipidemia: Goal LDL < 70, lipids at goal in 11/17.   8. Hyperthyroidism: Suspect subclinical thyroiditis rather than Graves disease.  Thyroid indices are now normal and she is off methimazole.  Dr. Dorris Fetch following.   Loralie Champagne 03/16/2016 9:54 PM

## 2016-03-17 ENCOUNTER — Encounter: Payer: Self-pay | Admitting: "Endocrinology

## 2016-03-17 ENCOUNTER — Ambulatory Visit (INDEPENDENT_AMBULATORY_CARE_PROVIDER_SITE_OTHER): Payer: Medicare Other | Admitting: "Endocrinology

## 2016-03-17 VITALS — BP 121/70 | HR 63 | Ht 66.0 in

## 2016-03-17 DIAGNOSIS — I779 Disorder of arteries and arterioles, unspecified: Secondary | ICD-10-CM | POA: Diagnosis not present

## 2016-03-17 DIAGNOSIS — E039 Hypothyroidism, unspecified: Secondary | ICD-10-CM | POA: Insufficient documentation

## 2016-03-17 MED ORDER — LEVOTHYROXINE SODIUM 50 MCG PO CAPS: 50.0000 ug | ORAL_CAPSULE | Freq: Every day | ORAL | 3 refills | Status: DC

## 2016-03-17 NOTE — Progress Notes (Signed)
Subjective:    Patient ID: [name] [name], female    DOB: [birth year], PCP Glenda Chroman, MD.   Past Medical History:  Diagnosis Date  . Arthritis   . Carotid artery disease (HCC)    a. Bilateral CEA; 50-60% bilateral ICA stenosis, 11/12  . Cellulitis    a. Recurrent, bilateral legs  . CHF (congestive heart failure) ([place])   . Chronic back pain   . Chronic diastolic heart failure ([place])   . Coronary atherosclerosis of native coronary artery    a. 11/2010 NSTEMI/CABG x 4: L-LAD; S-PL; S-OM; S-DX, by PVT.  [name] Critical lower limb ischemia   . Depression   . Essential hypertension, benign   . Herniated disc   . History of blood transfusion   . Ischemic cardiomyopathy    a. 11/2010 TEE: EF 40-45%.  . Migraine   . Mixed hyperlipidemia   . NSTEMI (non-ST elevated myocardial infarction) ([place]) 11/2010  . On home oxygen therapy   . PAD (peripheral artery disease) ([place])   . Pneumonia 2000's X 2  . Stroke ([place]) 08/2009  . Suicide attempt 08/2002  . TIA (transient ischemic attack)   . TMJ syndrome   . Type 2 diabetes mellitus ([place])   . Venous insufficiency    Past Surgical History:  Procedure Laterality Date  . ABDOMINAL HYSTERECTOMY    . APPENDECTOMY    . CARDIAC CATHETERIZATION  11/2010  . CARDIAC CATHETERIZATION N/A 09/16/2015   Procedure: Right Heart Cath;  Surgeon: Larey Dresser, MD;  Location: [facility];  Service: Cardiovascular;  Laterality: N/A;  . CAROTID ENDARTERECTOMY Bilateral 2011   Bilateral - Dr. Kellie Simmering  . CHOLECYSTECTOMY    . COLONOSCOPY W/ BIOPSIES AND POLYPECTOMY    . CORONARY ANGIOPLASTY WITH STENT PLACEMENT  03/2013   "1"  . CORONARY ARTERY BYPASS GRAFT  11/24/2010   Procedure: CORONARY ARTERY BYPASS GRAFTING (CABG);  Surgeon: Tharon Aquas Adelene Idler, MD;  Location: [facility];  Service: Open Heart Surgery;  Laterality: N/A;  Coronary Artery Bypass Graft times four on pump utilizing left internal mammary artery and bilateral greater saphenous veins harvested  endoscopically, transesophageal echocardiogram   . DEBRIDEMENT TOE Left    "nonhealing wound; 3rd digit  . DILATION AND CURETTAGE OF UTERUS    . ENDARTERECTOMY POPLITEAL Left 10/07/2013   Procedure: LEFT POPLITEAL ENDARTERECTOMY ;  Surgeon: Angelia Mould, MD;  Location: [facility];  Service: Vascular;  Laterality: Left;  . FEMORAL-POPLITEAL BYPASS GRAFT Left 10/07/2013   Procedure: LEFT FEMORAL-POPLITEAL ARTERY BYPASS GRAFT;  Surgeon: Angelia Mould, MD;  Location: [facility];  Service: Vascular;  Laterality: Left;  . FRACTURE SURGERY    . INTRAOPERATIVE ARTERIOGRAM Left 10/07/2013   Procedure: INTRA OPERATIVE ARTERIOGRAM LEFT LEG;  Surgeon: Angelia Mould, MD;  Location: [facility];  Service: Vascular;  Laterality: Left;  . IR GENERIC HISTORICAL  09/21/2015   IR REMOVAL OF PLURAL CATH W/CUFF 09/21/2015 Marybelle Killings, MD [facility]-INTER[facility] RAD  . LEFT AND RIGHT HEART CATHETERIZATION WITH CORONARY/GRAFT ANGIOGRAM N/A 03/27/2013   Procedure: LEFT AND RIGHT HEART CATHETERIZATION WITH Beatrix Fetters;  Surgeon: Burnell Blanks, MD;  Location: [facility];  Service: Cardiovascular;  Laterality: N/A;  . LOWER EXTREMITY ANGIOGRAM  09/08/2012; 10/06/2013   "found 100% blockage; unsuccessful attempt at crossing a chronic total occlusion of the left SFA in the setting of critical limb ischemia  . LOWER EXTREMITY ANGIOGRAM Bilateral 09/08/2013   Procedure: LOWER EXTREMITY ANGIOGRAM;  Surgeon: Roderic Palau  Adora Fridge, MD;  Location: [facility];  Service: Cardiovascular;  Laterality: Bilateral;  . PERIPHERAL VASCULAR CATHETERIZATION  11/04/2015   Procedure: Peripheral Vascular Atherectomy;  Surgeon: [name], MD;  Location: [facility];  Service: Cardiovascular;;  RT. SFA  . PERIPHERAL VASCULAR CATHETERIZATION Bilateral 11/04/2015   Procedure: Lower Extremity Intervention;  Surgeon: [name], MD;  Location: [facility] [facility];  Service: Cardiovascular;  Laterality: Bilateral;  .  PERIPHERAL VASCULAR CATHETERIZATION  11/04/2015   Procedure: Peripheral Vascular Balloon Angioplasty;  Surgeon: [name], MD;  Location: [facility];  Service: Cardiovascular;;  TP Trunk  . TOE SURGERY Left    "put pin in 2nd toe"  . TUBAL LIGATION    . WRIST FRACTURE SURGERY Left    "grafted bone from hip to wrist"   Social History   Social History  . Marital status: Widowed    Spouse name: N/A  . Number of children: N/A  . Years of education: N/A   Social History Main Topics  . Smoking status: Former Smoker    Packs/day: 3.00    Years: 50.00    Types: Cigarettes    Start date: 01/24/1956    Quit date: 10/09/2009  . Smokeless tobacco: Never Used  . Alcohol use 0.0 oz/week     Comment: 10/06/2013 "might have a drink q 2-3 months"  . Drug use: Yes    Types: Marijuana     Comment: "smoked pot in my teens"  . Sexual activity: Not Currently   Other Topics Concern  . None   Social History Narrative   Lives in [place] by herself.  She does not work.   Outpatient Encounter Prescriptions as of 03/17/2016  Medication Sig  . acetaminophen (TYLENOL) 500 MG tablet Take 500-1,000 mg by mouth every 6 (six) hours as needed for mild pain or moderate pain.   [name] albuterol (PROVENTIL HFA;VENTOLIN HFA) 108 (90 BASE) MCG/ACT inhaler Inhale 2 puffs into the lungs daily as needed for wheezing or shortness of breath.   [name] amLODipine (NORVASC) 10 MG tablet TAKE ONE TABLET BY MOUTH ONCE DAILY  . aspirin 81 MG EC tablet Take 81 mg by mouth daily.  [name] atorvastatin (LIPITOR) 40 MG tablet Take 40 mg by mouth daily at 6 PM.  . citalopram (CELEXA) 40 MG tablet Take 40 mg by mouth every morning.   . clopidogrel (PLAVIX) 75 MG tablet TAKE ONE TABLET BY MOUTH ONCE DAILY WITH  BREAKFAST  . clotrimazole-betamethasone (LOTRISONE) cream Apply 1 application topically 2 (two) times daily as needed.  Mariane Baumgarten Calcium (STOOL SOFTENER PO) Take 200 mg by mouth at bedtime as needed (for constipation).   .  gabapentin (NEURONTIN) 300 MG capsule Take 300-600 mg by mouth See admin instructions. 300 mg in the morning and 600 mg prior to bed(time)  . hydrALAZINE (APRESOLINE) 50 MG tablet TAKE ONE & ONE-HALF TABLETS BY MOUTH THREE TIMES DAILY  . HYDROcodone-acetaminophen (NORCO/VICODIN) 5-325 MG tablet Take 1 tablet by mouth 3 (three) times daily as needed.  . insulin aspart (NOVOLOG) 100 UNIT/ML injection Inject 3 Units into the skin 3 (three) times daily with meals. (Patient taking differently: Inject 3-7 Units into the skin 3 (three) times daily after meals. Per sliding scale)  . insulin detemir (LEVEMIR) 100 UNIT/ML injection Inject 7 Units into the skin at bedtime.  . isosorbide mononitrate (IMDUR) 30 MG 24 hr tablet TAKE ONE TABLET BY MOUTH ONCE DAILY  . Levothyroxine Sodium 50 MCG  CAPS Take 1 capsule (50 mcg total) by mouth daily before breakfast.  . Multiple Vitamin (MULTIVITAMIN WITH MINERALS) TABS tablet Take 1 tablet by mouth daily.  . nitroGLYCERIN (NITROSTAT) 0.4 MG SL tablet Place 1 tablet (0.4 mg total) under the tongue every 5 (five) minutes as needed for chest pain. Up to 3 doses. If no relief after 3rd dose, proceed to the ED for an evaluation (Patient not taking: Reported on 03/16/2016)  . nystatin (MYCOSTATIN/NYSTOP) powder Apply 1 application topically 2 (two) times daily as needed.  . potassium chloride SA (K-DUR,KLOR-CON) 20 MEQ tablet Take 1 tablet (20 mEq total) by mouth 2 (two) times daily.  [name] spironolactone (ALDACTONE) 25 MG tablet Take 0.5 tablets (12.5 mg total) by mouth daily.  [name] tiZANidine (ZANAFLEX) 4 MG tablet Take 4 mg by mouth 2 (two) times daily as needed for muscle spasms.  [name] torsemide (DEMADEX) 20 MG tablet Take 80 mg(4 tabs) in the AM and 40 mg (2 tabs) in the PM  . umeclidinium-vilanterol (ANORO ELLIPTA) 62.5-25 MCG/INH AEPB Inhale 1 puff into the lungs daily.   No facility-administered encounter medications on file as of 03/17/2016.    ALLERGIES: Allergies  Allergen  Reactions  . Other Shortness Of Breath, Rash and Other (See Comments)    CANNOT EAT ANY BERRIES!!  . Strawberry Extract Hives, Shortness Of Breath and Rash  . Sulfa Antibiotics Swelling and Other (See Comments)    Bodily Swelling  . Coffee Bean Extract [Coffea Arabica] Itching and Rash  . Tape Other (See Comments)    Tears skin.  Please use "paper" tape only.   VACCINATION STATUS: Immunization History  Administered Date(s) Administered  . Influenza,inj,Quad PF,36+ Mos 10/10/2013, 09/05/2014  . Pneumococcal Polysaccharide-23 03/21/2013, 09/05/2014    HPI  The patient presents today with a medical history as above, and is being seen in f/u for Subacute thyroiditis.  - She complains of fatigue and weight gain. -  She has regained her weight up by 16 pounds since last visit.  Review of Systems Constitutional:  weight loss, + fatigue, - hyperthermia Eyes: no blurry vision, +xerophthalmia ENT: no sore throat, no nodules palpated in throat, no dysphagia/odynophagia, no hoarseness Cardiovascular: no CP/SOB/palpitations/leg swelling Respiratory: + cough, +SOB Gastrointestinal: no N/V/D/C Musculoskeletal: no muscle/joint aches Skin: no rashes Neurological:  No tremors, - numbness/tingling, + dizziness Psychiatric: +anxiety  Objective:    BP 121/70   Pulse 63   Ht 5' 6" (1.676 m)   Wt Readings from Last 3 Encounters:  03/16/16 201 lb (91.2 kg)  02/23/16 185 lb (83.9 kg)  01/28/16 191 lb 3.2 oz (86.7 kg)    Physical Exam Constitutional: overweight, in NAD, she uses oxygen per nasal cannula Eyes: PERRLA, EOMI,  ENT: moist mucous membranes, no thyromegaly, no cervical lymphadenopathy Cardiovascular: Distant heart sounds midline sternotomy scar from prior coronary artery bypass graft Respiratory: Tight  chest was poor air entry. Gastrointestinal: abdomen soft, NT, ND, BS+ Musculoskeletal: no deformities, strength intact in all 4 Skin: moist, warm, no rashes Neurological: -   tremor with outstretched hands, DTR normal in all 4  Results for orders placed or performed during the hospital encounter of 28/31/51  Basic metabolic panel  Result Value Ref Range   Sodium 132 (L) 135 - 145 mmol/L   Potassium 4.2 3.5 - 5.1 mmol/L   Chloride 95 (L) 101 - 111 mmol/L   CO2 29 22 - 32 mmol/L   Glucose, Bld 321 (H) 65 - 99 mg/dL   BUN 67 (  H) 6 - 20 mg/dL   Creatinine, Ser 1.67 (H) 0.44 - 1.00 mg/dL   Calcium 8.9 8.9 - 10.3 mg/dL   GFR calc non Af Amer 30 (L) >60 mL/min   GFR calc Af Amer 35 (L) >60 mL/min   Anion gap 8 5 - 15  B Nat Peptide  Result Value Ref Range   B Natriuretic Peptide 542.5 (H) 0.0 - 100.0 pg/mL   Complete Blood Count (Most recent): Lab Results  Component Value Date   WBC 8.6 01/07/2016   HGB 11.5 (L) 01/07/2016   HCT 36.1 01/07/2016   MCV 81.9 01/07/2016   PLT 197 01/07/2016   Chemistry (most recent): Lab Results  Component Value Date   NA 132 (L) 03/16/2016   K 4.2 03/16/2016   CL 95 (L) 03/16/2016   CO2 29 03/16/2016   BUN 67 (H) 03/16/2016   CREATININE 1.67 (H) 03/16/2016   Diabetic Labs (most recent): Lab Results  Component Value Date   HGBA1C 6.4 (H) 09/14/2015   HGBA1C 10.8 (H) 03/18/2013   HGBA1C 8.1 (H) 11/22/2010   Results for DANIELYS, MADRY (MRN [id number]) as of 03/17/2016 11:41  Ref. Range 09/15/2015 09:27 12/15/2015 10:29 01/17/2016 09:00  TSH Latest Units: mIU/L 0.103 (L) 0.02 (L) 4.69 (H)  Triiodothyronine,Free,Serum Latest Ref Range: 2.3 - 4.2 pg/mL 3.1  2.3  T4,Free(Direct) Latest Ref Range: 0.8 - 1.8 ng/dL 2.33 (H) 1.7 0.8      Assessment & Plan:   1. Hypothyroidism: No diagnosis. - Her repeat thyroid function tests  now consistent with hypothyroidism. - She will be initiated on thyroid hormone replacement. I will start with levothyroxine 50 g by mouth every morning. She may need higher dose of thyroid hormone on subsequent visits depending on her labs.   - We discussed about correct intake of levothyroxine, at  fasting, with water, separated by at least 30 minutes from breakfast, and separated by more than 4 hours from calcium, iron, multivitamins, acid reflux medications (PPIs). -Patient is made aware of the fact that thyroid hormone replacement is needed for life, dose to be adjusted by periodic monitoring of thyroid function tests.   - She has well controlled type 2 diabetes with A1c of 6.4% following with her primary care doctor. - I advised patient to maintain close follow up with Glenda Chroman, MD for primary care needs.  Follow up plan: Return in about 3 months (around 06/17/2016) for follow up with pre-visit labs.  Glade Lloyd, MD Phone: [phone]  Fax: [phone]   03/17/2016, 11:40 AM

## 2016-03-27 DIAGNOSIS — I5032 Chronic diastolic (congestive) heart failure: Secondary | ICD-10-CM | POA: Diagnosis not present

## 2016-03-30 ENCOUNTER — Ambulatory Visit (HOSPITAL_COMMUNITY)
Admission: RE | Admit: 2016-03-30 | Discharge: 2016-03-30 | Disposition: A | Discharge status: Home / Self Care | Payer: Medicare Other | Source: Ambulatory Visit | Attending: Internal Medicine | Admitting: Internal Medicine

## 2016-03-30 VITALS — BP 106/72 | HR 59 | Wt 189.0 lb

## 2016-03-30 DIAGNOSIS — Z5189 Encounter for other specified aftercare: Secondary | ICD-10-CM | POA: Insufficient documentation

## 2016-03-30 DIAGNOSIS — I5042 Chronic combined systolic (congestive) and diastolic (congestive) heart failure: Secondary | ICD-10-CM

## 2016-03-30 DIAGNOSIS — E785 Hyperlipidemia, unspecified: Secondary | ICD-10-CM | POA: Insufficient documentation

## 2016-03-30 DIAGNOSIS — E1165 Type 2 diabetes mellitus with hyperglycemia: Secondary | ICD-10-CM | POA: Diagnosis not present

## 2016-03-30 DIAGNOSIS — Z951 Presence of aortocoronary bypass graft: Secondary | ICD-10-CM | POA: Insufficient documentation

## 2016-03-30 DIAGNOSIS — N183 Chronic kidney disease, stage 3 unspecified: Secondary | ICD-10-CM

## 2016-03-30 DIAGNOSIS — I130 Hypertensive heart and chronic kidney disease with heart failure and stage 1 through stage 4 chronic kidney disease, or unspecified chronic kidney disease: Secondary | ICD-10-CM | POA: Insufficient documentation

## 2016-03-30 DIAGNOSIS — Z9889 Other specified postprocedural states: Secondary | ICD-10-CM | POA: Diagnosis not present

## 2016-03-30 DIAGNOSIS — E0590 Thyrotoxicosis, unspecified without thyrotoxic crisis or storm: Secondary | ICD-10-CM | POA: Insufficient documentation

## 2016-03-30 DIAGNOSIS — Z8249 Family history of ischemic heart disease and other diseases of the circulatory system: Secondary | ICD-10-CM | POA: Insufficient documentation

## 2016-03-30 DIAGNOSIS — Z801 Family history of malignant neoplasm of trachea, bronchus and lung: Secondary | ICD-10-CM | POA: Insufficient documentation

## 2016-03-30 DIAGNOSIS — I2510 Atherosclerotic heart disease of native coronary artery without angina pectoris: Secondary | ICD-10-CM | POA: Insufficient documentation

## 2016-03-30 DIAGNOSIS — E118 Type 2 diabetes mellitus with unspecified complications: Secondary | ICD-10-CM

## 2016-03-30 DIAGNOSIS — E1122 Type 2 diabetes mellitus with diabetic chronic kidney disease: Secondary | ICD-10-CM | POA: Insufficient documentation

## 2016-03-30 DIAGNOSIS — Z833 Family history of diabetes mellitus: Secondary | ICD-10-CM | POA: Insufficient documentation

## 2016-03-30 DIAGNOSIS — I5032 Chronic diastolic (congestive) heart failure: Secondary | ICD-10-CM | POA: Diagnosis not present

## 2016-03-30 DIAGNOSIS — I739 Peripheral vascular disease, unspecified: Secondary | ICD-10-CM | POA: Diagnosis not present

## 2016-03-30 DIAGNOSIS — I779 Disorder of arteries and arterioles, unspecified: Secondary | ICD-10-CM | POA: Diagnosis not present

## 2016-03-30 DIAGNOSIS — Z87891 Personal history of nicotine dependence: Secondary | ICD-10-CM | POA: Diagnosis not present

## 2016-03-30 DIAGNOSIS — Z8673 Personal history of transient ischemic attack (TIA), and cerebral infarction without residual deficits: Secondary | ICD-10-CM | POA: Insufficient documentation

## 2016-03-30 NOTE — Patient Instructions (Signed)
No lab work today.  No changes to medication today.  Follow up 3 months with Dr. Aundra Dubin and echocardiogram.  Do the following things EVERYDAY: 1) Weigh yourself in the morning before breakfast. Write it down and keep it in a log. 2) Take your medicines as prescribed 3) Eat low salt foods-Limit salt (sodium) to 2000 mg per day.  4) Stay as active as you can everyday 5) Limit all fluids for the day to less than 2 liters

## 2016-03-30 NOTE — Progress Notes (Signed)
Advanced Heart Failure Medication Review by a Pharmacist  Does the patient  feel that his/her medications are working for him/her?  yes  Has the patient been experiencing any side effects to the medications prescribed?  no  Does the patient measure his/her own blood pressure or blood glucose at home?  yes - no concerns with values at home  Does the patient have any problems obtaining medications due to transportation or finances?   no - daughters pick up medications from [place], Medicare Part D  Understanding of regimen: fair Understanding of indications: fair Potential of compliance: good Patient understands to avoid NSAIDs. Patient understands to avoid decongestants.  Issues to address at subsequent visits: Non   Pharmacist comments: Ms. Heinrichs is a pleasant 71 yo Caucasian female presenting to advanced HF clinic with her daughter. She brought in a medication list with the only update being the addition of levothyroxine by PCP. She endorses good compliance, admitting to sometimes missing 1-2 night doses per week (includes PM dose of torsemide). They had no questions or concerns about medications at this time.   Belia Heman, PharmD PGY1 Pharmacy Resident [phone] (Pager) 03/30/2016 10:58 AM  Time with patient: 10 min Preparation and documentation time: 2 min Total time: 12 min

## 2016-03-30 NOTE — Progress Notes (Signed)
Advanced Heart Failure Clinic Note   PCP: Dr. Woody Seller Cardiology: Dr. Domenic Polite HF Cardiology: Dr. Aundra Dubin  71 yo with history of chronic diastolic CHF, CAD s/p CABG, chronic right pleural effusion, CKD stage III, and PAD presents for CHF clinic evaluation.  She has had multiple admissions with diastolic CHF complicated by CKD.  She was admitted in 7/17 for diuresis.  Worsening renal function was noted.  Lisinopril was stopped with elevated creatinine and Coreg was stopped with bradycardia.  Last echo in 7/17 showed EF 55-60% with grade II diastolic dysfunction and D-shaped interventricular septum.  She has been wearing home oxygen for > 1 year.    She was admitted again in 9/17 with volume overload.  She was diuresed extensively.  Pleurx catheter was removed from right chest.  RHC showed elevated right and left heart filling pressure with pulmonary venous hypertension.  She was diagnosed with hyperthyroidism and started on methimazole.  She was seen by Dr Dorris Fetch (endocrinology) and thought to have subacute thyroiditis rather than Graves disease.  She is now off methimazole and thyroid indices have normalized.   After discharge, in 10/17 she had peripheral angiography PV => right SFA and right tibioperoneal trunk angioplasties.  She now has only rare claudication.     She presents for regular follow up.  At last visit torsemide increased acutely and chronically (80/80 for several days and then 80/40 moving forward). Weight down 12 lbs from last visit.  Pt states she occasionally she missed her evening medications, but ALWAYS takes her torsemide. Breathing is fine. Denies dyspnea with using walker around house. No DOE with changing clothes or bathing. Family have noticed her breathing is much better since diuresis. Abdomen not as bloated.    Labs (7/17): K 4.2, creatinine 2.04 Labs (10/17): K 4.5, creatinine 1.68, HCT 30.5 Labs (11/17): K 3.9, creatinine 1.78, LDL 36 Labs (12/17): K 3.9, creatinine 1.49  => 1.53  PMH: 1. Chronic diastolic CHF: Multiple admissions.  - Echo (7/17) with EF 55-60%, grade II diastolic dysfunction, D-shaped interventricular septum, mild MR, PASP 52 mmHg.  - Uses home oxygen.  - RHC (9/17): mean 15, PA 73/26 mean 42, mean PCWP 25, CI 2.84, PVR 3.0 WU => pulmonary venous hypertension.  2. H/o CVA 3. Recurrent right pleural effusion: S/p Pleurx catheter placement.  4. Depression 5. HTN 6. Carotid stenosis: Bilateral CEAs 7. Type II diabetes.  8. CAD: NSTEMI 11/12, had CABG with LIMA-LAD, SVG-PLV, SVG-OM, SVG-D.   - LHC (9/15) with DES to SVG-D.  Other grafts patent.  9. PAD: Left femoral-popliteal bypass 9/15.  - peripheral arterial dopplers (2/17) with right ABI 0.53, left ABI 1.02.  - 10/17 right SFA atherectomy and right tibioperoneal trunk atherectomy.  10. CKD stage III. Lisinopril stopped with increased creatinine.  11. Bradycardia: Stopped Coreg.  12. Hyperthyroidism: Diagnosed in 9/17. Subacute thyroiditis.  Followed by Dr. Dorris Fetch.  13. Sleep study 12/17 without significant OSA.   SH: Lives in [place] with son.  Prior smoker, quit 2012.  Retired.   Family History  Problem Relation Age of Onset  . Coronary artery disease Father     Died with MI age 22  . Heart disease Father     before age 71  . Heart attack Father   . Diabetes type II Mother   . Hypertension Mother   . Diabetes Mother   . Heart disease Mother   . Hyperlipidemia Mother   . Heart failure Sister   . Cancer Sister   .  Cancer Brother     Lung cancer  . Diabetes Daughter   . Hyperlipidemia Daughter   . Diabetes Son   . Hyperlipidemia Son    Review of systems complete and found to be negative unless listed in HPI.    BP 106/72 (BP Location: Right Arm, Patient Position: Sitting, Cuff Size: Normal)   Pulse (!) 59   Wt 189 lb (85.7 kg)   SpO2 91% Comment: 2L continuous O2  BMI 30.51 kg/m    Wt Readings from Last 3 Encounters:  03/30/16 189 lb (85.7 kg)  03/16/16 201 lb (91.2  kg)  02/23/16 185 lb (83.9 kg)     General: Well appearing, in [place], Daughter present.  Neck: JVP 7-8 cm, No thyromegaly or nodule noted.   Lungs: Clear, normal effort.  CV: Nondisplaced PMI.  RRR. 1/6 SEM RUSB. Trace ankle edema. No carotid bruit. Difficult to palpate pedal pulses.   Abdomen: Soft, NT, ND, no HSM. No bruits or masses. +BS  Skin: Intact without lesions or rashes.  Neurologic: Alert & oriented x 3. Cranial nerves grossly intact. Moves all 4 extremities w/o difficulty. Affect pleasant    Psych: Normal affect.  Extremities: No clubbing/cyanosis.   HEENT: Normal.   Assessment/plan: 1. 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  - Volume status much improved on increase torsemide.  - Continue torsemide 80 mg q am and 40 mg q pm. - Continue KCl 20 meq BID.  BMET earlier this week via [facility] stable.   - Reinforced fluid restriction to < 2 L daily, sodium restriction to less than 2000 mg daily, and the importance of daily weights.   2. CKD stage III:  - Stable on check earlier this week.  3. CAD: s/p CABG.   - No chest pain.  H/o PCI to SVG-D in 9/15.  - Continue ASA 81 mg daily, Plavix, and statin.   4. Recurrent right pleural effusion:  - Pleurx catheter removed in 9/17. No recurrence.  5. PAD: s/p recent right leg atherectomies.  - Minimal claudication symptoms. No poorly healing wounds.    7. Hyperlipidemia: Goal LDL < 70, lipids at goal in 11/17.  Recheck next visit.  8. Hyperthyroidism:  - Dr. Dorris Fetch following. Off methimazole.   Overall doing well after recent diuretic adjustment. Continue current regimen. Will follow up 2-3 months with Echo.   Shirley Friar, PA-C  03/30/2016 10:46 AM

## 2016-04-07 ENCOUNTER — Other Ambulatory Visit (HOSPITAL_COMMUNITY): Payer: Self-pay | Admitting: Cardiology

## 2016-04-07 ENCOUNTER — Ambulatory Visit: Payer: Medicare Other | Admitting: Cardiology

## 2016-04-18 DIAGNOSIS — M5440 Lumbago with sciatica, unspecified side: Secondary | ICD-10-CM | POA: Diagnosis not present

## 2016-04-18 DIAGNOSIS — G20 Parkinson's disease: Secondary | ICD-10-CM | POA: Diagnosis not present

## 2016-04-18 DIAGNOSIS — Z299 Encounter for prophylactic measures, unspecified: Secondary | ICD-10-CM | POA: Diagnosis not present

## 2016-04-18 DIAGNOSIS — E039 Hypothyroidism, unspecified: Secondary | ICD-10-CM | POA: Diagnosis not present

## 2016-04-18 DIAGNOSIS — I272 Pulmonary hypertension, unspecified: Secondary | ICD-10-CM | POA: Diagnosis not present

## 2016-04-18 DIAGNOSIS — N182 Chronic kidney disease, stage 2 (mild): Secondary | ICD-10-CM | POA: Diagnosis not present

## 2016-04-18 DIAGNOSIS — E1122 Type 2 diabetes mellitus with diabetic chronic kidney disease: Secondary | ICD-10-CM | POA: Diagnosis not present

## 2016-04-18 DIAGNOSIS — I6529 Occlusion and stenosis of unspecified carotid artery: Secondary | ICD-10-CM | POA: Diagnosis not present

## 2016-04-18 DIAGNOSIS — L98499 Non-pressure chronic ulcer of skin of other sites with unspecified severity: Secondary | ICD-10-CM | POA: Diagnosis not present

## 2016-04-18 DIAGNOSIS — I639 Cerebral infarction, unspecified: Secondary | ICD-10-CM | POA: Diagnosis not present

## 2016-04-18 DIAGNOSIS — Z6831 Body mass index (BMI) 31.0-31.9, adult: Secondary | ICD-10-CM | POA: Diagnosis not present

## 2016-04-18 DIAGNOSIS — I509 Heart failure, unspecified: Secondary | ICD-10-CM | POA: Diagnosis not present

## 2016-04-27 ENCOUNTER — Other Ambulatory Visit (HOSPITAL_COMMUNITY): Payer: Self-pay | Admitting: Student

## 2016-05-07 ENCOUNTER — Other Ambulatory Visit: Payer: Self-pay | Admitting: Cardiology

## 2016-05-11 NOTE — Progress Notes (Signed)
Cardiology Office Note  Date: 05/12/2016   ID: [name] [name], DOB July 19, 1945, MRN [id number]  PCP: Glenda Chroman, MD  Primary Cardiologist: Rozann Lesches, MD   Chief Complaint  Patient presents with  . Diastolic heart failure    History of Present Illness: [name] [name] is a medically complex 71 y.o. female last seen in January. She has maintained interval follow-up in the CHF clinic, I reviewed the recent note from March. At that time she was continued on Demadex at 80 mg in the morning and 40 mg in the evening with potassium supplements. She presents today with her daughter for a follow-up visit. They explain that [name] [name] has done fairly well without any fluctuation in weight by their home scales on the current diuretic dose. We discussed her sodium and fluid intake, sometimes she "cheats." She has not been hospitalized. She is to have follow-up in the CHF clinic in June with a repeat echocardiogram.  Last blood work was in March. BUN and creatinine were 67 and 1.67 respectively. I reviewed her current medications which are outlined below.  She does not report any angina symptoms and remains on aspirin and Plavix. Lipids have been aggressively controlled on Lipitor. She reports good control of leg edema in general, was not able to tolerate compression hose however.  She continues to require oxygen supplementation.  Past Medical History:  Diagnosis Date  . Arthritis   . Carotid artery disease (HCC)    a. Bilateral CEA; 50-60% bilateral ICA stenosis, 11/12  . Cellulitis    a. Recurrent, bilateral legs  . CHF (congestive heart failure) ([place])   . Chronic back pain   . Chronic diastolic heart failure ([place])   . Coronary atherosclerosis of native coronary artery    a. 11/2010 NSTEMI/CABG x 4: L-LAD; S-PL; S-OM; S-DX, by PVT.  [name] Critical lower limb ischemia   . Depression   . Essential hypertension, benign   . Herniated disc   . History of blood transfusion   . Ischemic  cardiomyopathy    a. 11/2010 TEE: EF 40-45%.  . Migraine   . Mixed hyperlipidemia   . NSTEMI (non-ST elevated myocardial infarction) ([place]) 11/2010  . On home oxygen therapy   . PAD (peripheral artery disease) ([place])   . Pneumonia 2000's X 2  . Stroke ([place]) 08/2009  . Suicide attempt ([place]) 08/2002  . TIA (transient ischemic attack)   . TMJ syndrome   . Type 2 diabetes mellitus ([place])   . Venous insufficiency     Past Surgical History:  Procedure Laterality Date  . ABDOMINAL HYSTERECTOMY    . APPENDECTOMY    . CARDIAC CATHETERIZATION  11/2010  . CARDIAC CATHETERIZATION N/A 09/16/2015   Procedure: Right Heart Cath;  Surgeon: Larey Dresser, MD;  Location: [place] [facility];  Service: Cardiovascular;  Laterality: N/A;  . CAROTID ENDARTERECTOMY Bilateral 2011   Bilateral - Dr. Kellie Simmering  . CHOLECYSTECTOMY    . COLONOSCOPY W/ BIOPSIES AND POLYPECTOMY    . CORONARY ANGIOPLASTY WITH STENT PLACEMENT  03/2013   "1"  . CORONARY ARTERY BYPASS GRAFT  11/24/2010   Procedure: CORONARY ARTERY BYPASS GRAFTING (CABG);  Surgeon: Tharon Aquas Adelene Idler, MD;  Location: [facility];  Service: Open Heart Surgery;  Laterality: N/A;  Coronary Artery Bypass Graft times four on pump utilizing left internal mammary artery and bilateral greater saphenous veins harvested endoscopically, transesophageal echocardiogram   . DEBRIDEMENT TOE Left    "nonhealing wound; 3rd digit  .  DILATION AND CURETTAGE OF UTERUS    . ENDARTERECTOMY POPLITEAL Left 10/07/2013   Procedure: LEFT POPLITEAL ENDARTERECTOMY ;  Surgeon: Angelia Mould, MD;  Location: [facility];  Service: Vascular;  Laterality: Left;  . FEMORAL-POPLITEAL BYPASS GRAFT Left 10/07/2013   Procedure: LEFT FEMORAL-POPLITEAL ARTERY BYPASS GRAFT;  Surgeon: Angelia Mould, MD;  Location: [facility];  Service: Vascular;  Laterality: Left;  . FRACTURE SURGERY    . INTRAOPERATIVE ARTERIOGRAM Left 10/07/2013   Procedure: INTRA OPERATIVE ARTERIOGRAM LEFT LEG;  Surgeon: Angelia Mould, MD;  Location: [facility];  Service: Vascular;  Laterality: Left;  . IR GENERIC HISTORICAL  09/21/2015   IR REMOVAL OF PLURAL CATH W/CUFF 09/21/2015 Marybelle Killings, MD [facility]-INTER[facility] RAD  . LEFT AND RIGHT HEART CATHETERIZATION WITH CORONARY/GRAFT ANGIOGRAM N/A 03/27/2013   Procedure: LEFT AND RIGHT HEART CATHETERIZATION WITH Beatrix Fetters;  Surgeon: Burnell Blanks, MD;  Location: [facility] CATH LAB;  Service: Cardiovascular;  Laterality: N/A;  . LOWER EXTREMITY ANGIOGRAM  09/08/2012; 10/06/2013   "found 100% blockage; unsuccessful attempt at crossing a chronic total occlusion of the left SFA in the setting of critical limb ischemia  . LOWER EXTREMITY ANGIOGRAM Bilateral 09/08/2013   Procedure: LOWER EXTREMITY ANGIOGRAM;  Surgeon: [name], MD;  Location: [facility] CATH LAB;  Service: Cardiovascular;  Laterality: Bilateral;  . PERIPHERAL VASCULAR CATHETERIZATION  11/04/2015   Procedure: Peripheral Vascular Atherectomy;  Surgeon: [name], MD;  Location: [place] [facility];  Service: Cardiovascular;;  RT. SFA  . PERIPHERAL VASCULAR CATHETERIZATION Bilateral 11/04/2015   Procedure: Lower Extremity Intervention;  Surgeon: [name], MD;  Location: [place] [facility];  Service: Cardiovascular;  Laterality: Bilateral;  . PERIPHERAL VASCULAR CATHETERIZATION  11/04/2015   Procedure: Peripheral Vascular Balloon Angioplasty;  Surgeon: [name], MD;  Location: [place] [facility];  Service: Cardiovascular;;  TP Trunk  . TOE SURGERY Left    "put pin in 2nd toe"  . TUBAL LIGATION    . WRIST FRACTURE SURGERY Left    "grafted bone from hip to wrist"    Current Outpatient Prescriptions  Medication Sig Dispense Refill  . acetaminophen (TYLENOL) 500 MG tablet Take 500-1,000 mg by mouth every 6 (six) hours as needed for mild pain or moderate pain.     [name] albuterol (PROVENTIL HFA;VENTOLIN HFA) 108 (90 BASE) MCG/ACT inhaler Inhale 2 puffs into the lungs daily as needed for wheezing or  shortness of breath.     [name] amLODipine (NORVASC) 10 MG tablet TAKE ONE TABLET BY MOUTH ONCE DAILY 90 tablet 3  . aspirin 81 MG EC tablet Take 81 mg by mouth daily.    [name] atorvastatin (LIPITOR) 40 MG tablet Take 40 mg by mouth daily at 6 PM.    . citalopram (CELEXA) 40 MG tablet Take 40 mg by mouth every morning.     . clopidogrel (PLAVIX) 75 MG tablet TAKE ONE TABLET BY MOUTH ONCE DAILY WITH  BREAKFAST 30 tablet 6  . clotrimazole-betamethasone (LOTRISONE) cream Apply 1 application topically 2 (two) times daily as needed.    Mariane Baumgarten Calcium (STOOL SOFTENER PO) Take 200 mg by mouth at bedtime as needed (for constipation).     . gabapentin (NEURONTIN) 300 MG capsule Take 300-600 mg by mouth See admin instructions. 300 mg in the morning and 600 mg prior to bed(time)    . hydrALAZINE (APRESOLINE) 50 MG tablet TAKE ONE & ONE-HALF TABLETS BY MOUTH THREE TIMES DAILY 100 tablet 3  . HYDROcodone-acetaminophen (NORCO/VICODIN)  5-325 MG tablet Take 1 tablet by mouth 3 (three) times daily as needed.    . insulin aspart (NOVOLOG) 100 UNIT/ML injection Inject 3 Units into the skin 3 (three) times daily with meals. (Patient taking differently: Inject 3-7 Units into the skin 3 (three) times daily after meals. Per sliding scale) 10 mL 11  . insulin detemir (LEVEMIR) 100 UNIT/ML injection Inject 7 Units into the skin at bedtime.    . isosorbide mononitrate (IMDUR) 30 MG 24 hr tablet TAKE ONE TABLET BY MOUTH ONCE DAILY 30 tablet 3  . Levothyroxine Sodium 50 MCG CAPS Take 1 capsule (50 mcg total) by mouth daily before breakfast. 30 capsule 3  . Multiple Vitamin (MULTIVITAMIN WITH MINERALS) TABS tablet Take 1 tablet by mouth daily.    . nitroGLYCERIN (NITROSTAT) 0.4 MG SL tablet Place 1 tablet (0.4 mg total) under the tongue every 5 (five) minutes as needed for chest pain. Up to 3 doses. If no relief after 3rd dose, proceed to the ED for an evaluation 25 tablet 3  . nystatin (MYCOSTATIN/NYSTOP) powder Apply 1 application  topically 2 (two) times daily as needed.    . potassium chloride SA (K-DUR,KLOR-CON) 20 MEQ tablet Take 1 tablet (20 mEq total) by mouth 2 (two) times daily. 60 tablet 3  . spironolactone (ALDACTONE) 25 MG tablet TAKE ONE-HALF TABLET BY MOUTH ONCE DAILY 16 tablet 6  . tiZANidine (ZANAFLEX) 4 MG tablet Take 4 mg by mouth 2 (two) times daily as needed for muscle spasms.    [name] torsemide (DEMADEX) 20 MG tablet Take 80 mg(4 tabs) in the AM and 40 mg (2 tabs) in the PM 195 tablet 3  . umeclidinium-vilanterol (ANORO ELLIPTA) 62.5-25 MCG/INH AEPB Inhale 1 puff into the lungs daily.     No current facility-administered medications for this visit.    Allergies:  Other; Strawberry extract; Sulfa antibiotics; Coffee bean extract [coffea arabica]; and Tape   Social History: The patient  reports that she quit smoking about 6 years ago. Her smoking use included Cigarettes. She started smoking about 60 years ago. She has a 150.00 pack-year smoking history. She has never used smokeless tobacco. She reports that she drinks alcohol. She reports that she uses drugs, including Marijuana.   ROS:  Please see the history of present illness. Otherwise, complete review of systems is positive for chronic dyspnea on exertion.  All other systems are reviewed and negative.   Physical Exam: VS:  BP (!) 122/52   Pulse (!) 56   Ht _0  (1.676 m)   Wt 195 lb (88.5 kg)   SpO2 (!) 89%   BMI 31.47 kg/m , BMI Body mass index is 31.47 kg/m.  Wt Readings from Last 3 Encounters:  05/12/16 195 lb (88.5 kg)  03/30/16 189 lb (85.7 kg)  03/16/16 201 lb (91.2 kg)    Chronically ill-appearing woman, no distress. Wearing oxygen. HEENT: Conjunctiva and lids normal, oropharynx clear. Neck: Supple, no elevated JVP, bilateral CEA scars, no thyromegaly. Lungs: Decreased breath sounds at the very bases, nonlabored breathing at rest. Cardiac:Regular rate and rhythm, no S3, soft systolic murmur, no pericardial rub. Abdomen: Bowel  sounds present, no guarding or rebound. Extremities: 1+ ankle edema and venous stasis, distal pulses 1+. Skin: Warm and dry. Musculoskeletal: No kyphosis. Neuropsychiatric: Alert and oriented 3, affect appropriate.  ECG: I personally reviewed the tracing from 10/19/2015 which showed sinus rhythm with old anterior infarct pattern.  Recent Labwork: 09/14/2015: ALT 18; AST 20 09/20/2015: Magnesium 3.0 01/07/2016:  Hemoglobin 11.5; Platelets 197 01/17/2016: TSH 4.69 03/16/2016: B Natriuretic Peptide 542.5; BUN 67; Creatinine, Ser 1.67; Potassium 4.2; Sodium 132     Component Value Date/Time   CHOL 78 11/15/2015 1114   TRIG 102 11/15/2015 1114   HDL 22 (L) 11/15/2015 1114   CHOLHDL 3.5 11/15/2015 1114   VLDL 20 11/15/2015 1114   LDLCALC 36 11/15/2015 1114    Other Studies Reviewed Today:  Echocardiogram 07/24/2015: Study Conclusions  - Left ventricle: The cavity size was normal. There was moderate concentric hypertrophy. Systolic function was normal. The estimated ejection fraction was in the range of 55% to 60%. Wall motion was normal; there were no regional wall motion abnormalities. Features are consistent with a pseudonormal left ventricular filling pattern, with concomitant abnormal relaxation and increased filling pressure (grade 2 diastolic dysfunction). Doppler parameters are consistent with elevated ventricular end-diastolic filling pressure. - Ventricular septum: The contour showed diastolic flattening and systolic flattening. - Aortic valve: Trileaflet; normal thickness leaflets. There was trivial regurgitation. - Aortic root: The aortic root was normal in size. - Ascending aorta: The ascending aorta was normal in size. - Mitral valve: Structurally normal valve. There was mild regurgitation. - Left atrium: The atrium was moderately dilated. - Right ventricle: Systolic function was normal. - Right atrium: The atrium was normal in size. - Tricuspid  valve: There was mild regurgitation. - Pulmonic valve: There was trivial regurgitation. - Pulmonary arteries: Systolic pressure was moderately increased. PA peak pressure: 52 mm Hg (S). - Inferior vena cava: The vessel was normal in size. - Pericardium, extracardiac: There was no pericardial effusion.  Assessment and Plan:  1. Chronic diastolic heart failure with associated right ventricular dysfunction as well and moderate pulmonary hypertension. She has been tolerating the current diuretic regimen, Demadex 80 mg the morning and 40 mg in the evening as well as Aldactone 12.5 mg daily. Weight by our scale is higher than at the last clinical visit (different scale), but reportedly there has not been major change per their home scale. Reinforced fluid and sodium restriction. We will re-check BMET. Keep follow-up in CHF clinic for echocardiogram in June.  2. CKD stage 3, last creatinine 1.67.  3. Multivessel CAD status post CABG and graft intervention. No active angina symptoms. She remains on dual antiplatelet therapy and statin.  4. Hyperlipidemia, on Lipitor.  Current medicines were reviewed with the patient today.   Orders Placed This Encounter  Procedures  . Basic metabolic panel    Disposition: Follow-up in August.  Signed, Satira Sark, MD, [facility] 05/12/2016 2:58 PM    [facility] [address], [place], [place] [address] Phone: [phone]; Fax: [phone]

## 2016-05-12 ENCOUNTER — Ambulatory Visit (INDEPENDENT_AMBULATORY_CARE_PROVIDER_SITE_OTHER): Payer: Medicare Other | Admitting: Cardiology

## 2016-05-12 ENCOUNTER — Encounter: Payer: Self-pay | Admitting: Cardiology

## 2016-05-12 VITALS — BP 122/52 | HR 56 | Ht 66.0 in | Wt 195.0 lb

## 2016-05-12 DIAGNOSIS — E782 Mixed hyperlipidemia: Secondary | ICD-10-CM

## 2016-05-12 DIAGNOSIS — I779 Disorder of arteries and arterioles, unspecified: Secondary | ICD-10-CM | POA: Diagnosis not present

## 2016-05-12 DIAGNOSIS — I2510 Atherosclerotic heart disease of native coronary artery without angina pectoris: Secondary | ICD-10-CM

## 2016-05-12 DIAGNOSIS — N183 Chronic kidney disease, stage 3 unspecified: Secondary | ICD-10-CM

## 2016-05-12 DIAGNOSIS — I5032 Chronic diastolic (congestive) heart failure: Secondary | ICD-10-CM | POA: Diagnosis not present

## 2016-05-12 MED ORDER — TORSEMIDE 20 MG PO TABS: ORAL_TABLET | ORAL | 3 refills | Status: DC

## 2016-05-12 NOTE — Patient Instructions (Signed)
Medication Instructions:  Continue all current medications.  Labwork:  BMET - order given today.    Office will contact with results via phone or letter.    Testing/Procedures: none  Follow-Up: 3 months   Any Other Special Instructions Will Be Listed Below (If Applicable).  If you need a refill on your cardiac medications before your next appointment, please call your pharmacy.

## 2016-05-15 ENCOUNTER — Other Ambulatory Visit: Payer: Self-pay | Admitting: Cardiology

## 2016-05-15 DIAGNOSIS — I5032 Chronic diastolic (congestive) heart failure: Secondary | ICD-10-CM | POA: Diagnosis not present

## 2016-05-15 LAB — BASIC METABOLIC PANEL
BUN: 74 mg/dL — ABNORMAL HIGH (ref 7–25)
CHLORIDE: 94 mmol/L — AB (ref 98–110)
CO2: 29 mmol/L (ref 20–31)
CREATININE: 1.9 mg/dL — AB (ref 0.50–0.99)
Calcium: 9 mg/dL (ref 8.6–10.4)
Glucose, Bld: 286 mg/dL — ABNORMAL HIGH (ref 65–99)
POTASSIUM: 4.8 mmol/L (ref 3.5–5.3)
SODIUM: 134 mmol/L — AB (ref 135–146)

## 2016-05-16 ENCOUNTER — Telehealth: Payer: Self-pay | Admitting: *Deleted

## 2016-05-16 IMAGING — US US THORACENTESIS ASP PLEURAL SPACE W/IMG GUIDE
1 series · 3 of 3 positions shown · non-contrast
Comparison: CXR 10/11/14.

MEDICATIONS:
None

COMPLICATIONS:
None immediate

INDICATION: Symptomatic right sided pleural effusion

EXAM:
US THORACENTESIS ASP PLEURAL SPACE W/IMG GUIDE
TECHNIQUE: Informed written consent was obtained from the patient after a
discussion of the risks, benefits and alternatives to treatment. A
timeout was performed prior to the initiation of the procedure.

[Series 1: us thoracentesis asp pleural space w/img guide · 0.28mm/px · 3 of 3 slices shown]
[im 1/3]
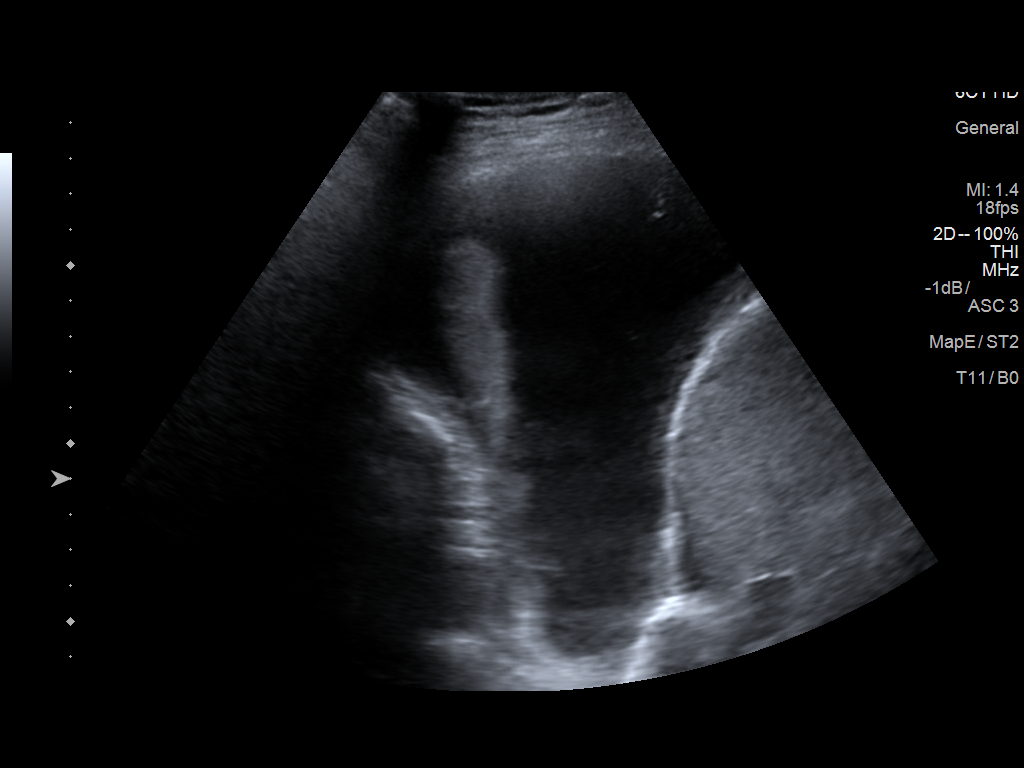
[im 2/3]
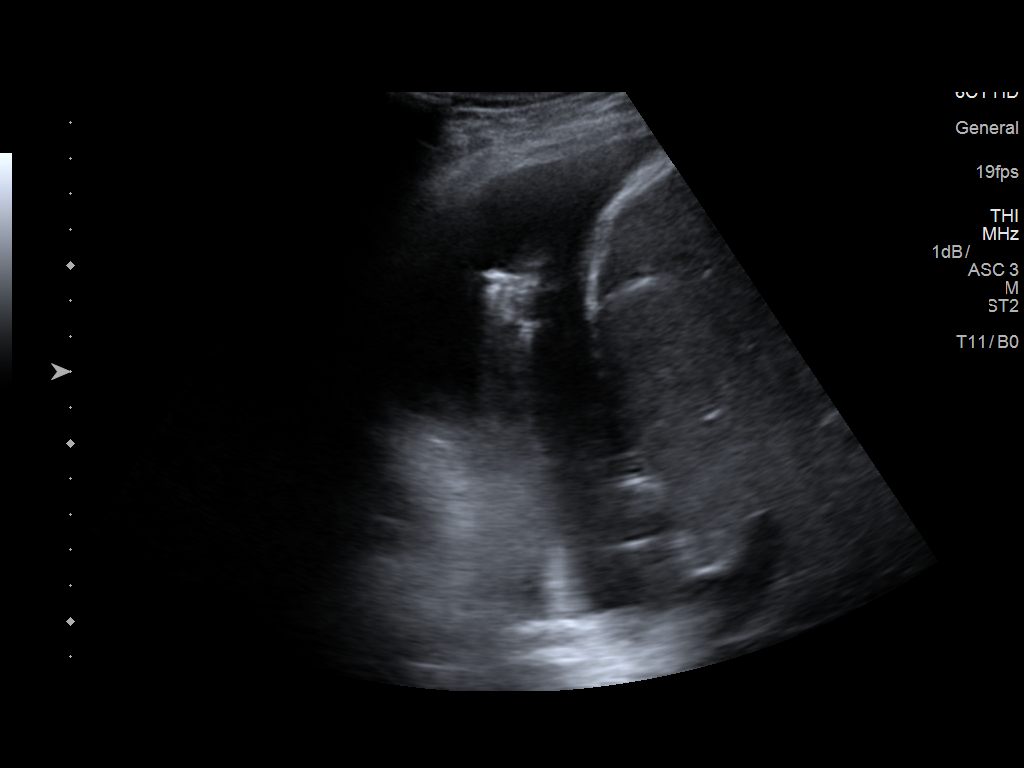
[im 3/3]
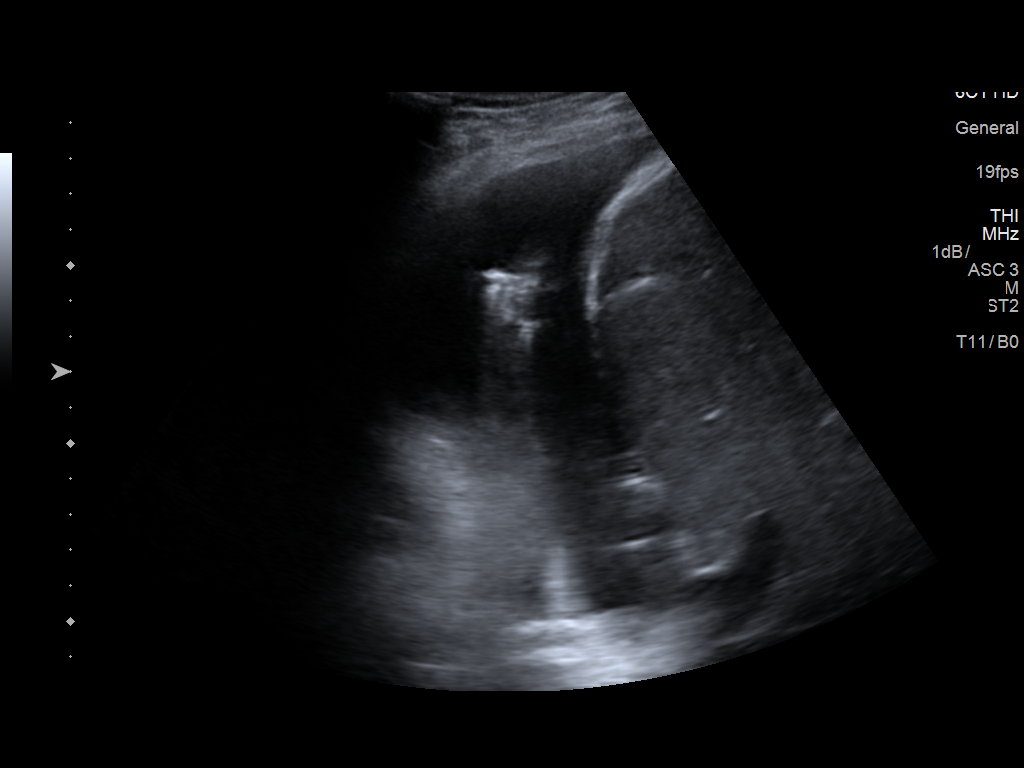

[3 of 3 positions shown; findings below may reference images not displayed]

Initial ultrasound scanning demonstrates a right pleural effusion.
The lower chest was prepped and draped in the usual sterile fashion.
1% lidocaine was used for local anesthesia.

Under direct ultrasound guidance, a 19 gauge, 7-cm, Yueh catheter
was introduced. An ultrasound image was saved for documentation
purposes. The thoracentesis was performed. The catheter was removed
and a dressing was applied. The patient tolerated the procedure well
without immediate post procedural complication. The patient was
escorted to have an upright chest radiograph.
FINDINGS: A total of approximately 1.4 liters of amber colored fluid was
removed. Requested samples were sent to the laboratory.
IMPRESSION: Successful ultrasound-guided right sided thoracentesis yielding
liters of pleural fluid.

## 2016-05-16 MED ORDER — TORSEMIDE 20 MG PO TABS: ORAL_TABLET | ORAL | 3 refills | Status: DC

## 2016-05-16 NOTE — Telephone Encounter (Signed)
Daughter informed and verbalized understanding of plan. Copy sent to PCP.

## 2016-05-16 NOTE — Telephone Encounter (Signed)
-----  Message from Satira Sark, MD sent at 05/16/2016  8:01 AM EDT ----- Results reviewed. Creatinine has bumped up from 1.67-1.90. Potassium normal. We can try and reduce Demadex to 60 mg the morning and 40 mg in the evening, she still needs to keep a close eye on weight and may need to take additional Demadex tablets as needed. A copy of this test should be forwarded to Glenda Chroman, MD.

## 2016-05-22 DIAGNOSIS — I509 Heart failure, unspecified: Secondary | ICD-10-CM | POA: Diagnosis not present

## 2016-05-22 DIAGNOSIS — I2510 Atherosclerotic heart disease of native coronary artery without angina pectoris: Secondary | ICD-10-CM | POA: Diagnosis not present

## 2016-05-22 DIAGNOSIS — E119 Type 2 diabetes mellitus without complications: Secondary | ICD-10-CM | POA: Diagnosis not present

## 2016-05-25 DIAGNOSIS — I10 Essential (primary) hypertension: Secondary | ICD-10-CM | POA: Diagnosis not present

## 2016-05-25 DIAGNOSIS — M5440 Lumbago with sciatica, unspecified side: Secondary | ICD-10-CM | POA: Diagnosis not present

## 2016-05-25 DIAGNOSIS — E1142 Type 2 diabetes mellitus with diabetic polyneuropathy: Secondary | ICD-10-CM | POA: Diagnosis not present

## 2016-05-25 DIAGNOSIS — G20 Parkinson's disease: Secondary | ICD-10-CM | POA: Diagnosis not present

## 2016-05-25 DIAGNOSIS — I509 Heart failure, unspecified: Secondary | ICD-10-CM | POA: Diagnosis not present

## 2016-05-25 DIAGNOSIS — Z6831 Body mass index (BMI) 31.0-31.9, adult: Secondary | ICD-10-CM | POA: Diagnosis not present

## 2016-05-25 DIAGNOSIS — E1165 Type 2 diabetes mellitus with hyperglycemia: Secondary | ICD-10-CM | POA: Diagnosis not present

## 2016-05-25 DIAGNOSIS — Z299 Encounter for prophylactic measures, unspecified: Secondary | ICD-10-CM | POA: Diagnosis not present

## 2016-05-25 DIAGNOSIS — E785 Hyperlipidemia, unspecified: Secondary | ICD-10-CM | POA: Diagnosis not present

## 2016-05-25 DIAGNOSIS — E039 Hypothyroidism, unspecified: Secondary | ICD-10-CM | POA: Diagnosis not present

## 2016-05-25 DIAGNOSIS — I639 Cerebral infarction, unspecified: Secondary | ICD-10-CM | POA: Diagnosis not present

## 2016-05-25 DIAGNOSIS — F329 Major depressive disorder, single episode, unspecified: Secondary | ICD-10-CM | POA: Diagnosis not present

## 2016-06-09 ENCOUNTER — Other Ambulatory Visit (HOSPITAL_COMMUNITY): Payer: Self-pay | Admitting: Student

## 2016-06-14 ENCOUNTER — Other Ambulatory Visit: Payer: Self-pay | Admitting: "Endocrinology

## 2016-06-14 DIAGNOSIS — E039 Hypothyroidism, unspecified: Secondary | ICD-10-CM | POA: Diagnosis not present

## 2016-06-14 LAB — T4, FREE: FREE T4: 1 ng/dL (ref 0.8–1.8)

## 2016-06-14 LAB — TSH: TSH: 3.41 mIU/L

## 2016-06-22 ENCOUNTER — Ambulatory Visit (HOSPITAL_COMMUNITY)
Admission: RE | Admit: 2016-06-22 | Discharge: 2016-06-22 | Disposition: A | Discharge status: Home / Self Care | Payer: Medicare Other | Source: Ambulatory Visit | Attending: Internal Medicine | Admitting: Internal Medicine

## 2016-06-22 ENCOUNTER — Ambulatory Visit (HOSPITAL_BASED_OUTPATIENT_CLINIC_OR_DEPARTMENT_OTHER)
Admission: RE | Admit: 2016-06-22 | Discharge: 2016-06-22 | Disposition: A | Discharge status: Home / Self Care | Payer: Medicare Other | Source: Ambulatory Visit | Attending: Internal Medicine | Admitting: Internal Medicine

## 2016-06-22 ENCOUNTER — Encounter (HOSPITAL_COMMUNITY): Payer: Self-pay | Admitting: Internal Medicine

## 2016-06-22 VITALS — BP 132/58 | HR 54 | Wt 191.8 lb

## 2016-06-22 DIAGNOSIS — E1151 Type 2 diabetes mellitus with diabetic peripheral angiopathy without gangrene: Secondary | ICD-10-CM | POA: Diagnosis not present

## 2016-06-22 DIAGNOSIS — N183 Chronic kidney disease, stage 3 unspecified: Secondary | ICD-10-CM

## 2016-06-22 DIAGNOSIS — I2510 Atherosclerotic heart disease of native coronary artery without angina pectoris: Secondary | ICD-10-CM | POA: Insufficient documentation

## 2016-06-22 DIAGNOSIS — I272 Pulmonary hypertension, unspecified: Secondary | ICD-10-CM | POA: Insufficient documentation

## 2016-06-22 DIAGNOSIS — Z87891 Personal history of nicotine dependence: Secondary | ICD-10-CM | POA: Insufficient documentation

## 2016-06-22 DIAGNOSIS — E1122 Type 2 diabetes mellitus with diabetic chronic kidney disease: Secondary | ICD-10-CM | POA: Diagnosis not present

## 2016-06-22 DIAGNOSIS — E0590 Thyrotoxicosis, unspecified without thyrotoxic crisis or storm: Secondary | ICD-10-CM | POA: Insufficient documentation

## 2016-06-22 DIAGNOSIS — I739 Peripheral vascular disease, unspecified: Secondary | ICD-10-CM | POA: Diagnosis not present

## 2016-06-22 DIAGNOSIS — Z79899 Other long term (current) drug therapy: Secondary | ICD-10-CM | POA: Insufficient documentation

## 2016-06-22 DIAGNOSIS — I130 Hypertensive heart and chronic kidney disease with heart failure and stage 1 through stage 4 chronic kidney disease, or unspecified chronic kidney disease: Secondary | ICD-10-CM | POA: Diagnosis not present

## 2016-06-22 DIAGNOSIS — I361 Nonrheumatic tricuspid (valve) insufficiency: Secondary | ICD-10-CM | POA: Insufficient documentation

## 2016-06-22 DIAGNOSIS — J9611 Chronic respiratory failure with hypoxia: Secondary | ICD-10-CM

## 2016-06-22 DIAGNOSIS — F329 Major depressive disorder, single episode, unspecified: Secondary | ICD-10-CM | POA: Diagnosis not present

## 2016-06-22 DIAGNOSIS — Z7982 Long term (current) use of aspirin: Secondary | ICD-10-CM | POA: Insufficient documentation

## 2016-06-22 DIAGNOSIS — Z7902 Long term (current) use of antithrombotics/antiplatelets: Secondary | ICD-10-CM | POA: Insufficient documentation

## 2016-06-22 DIAGNOSIS — E785 Hyperlipidemia, unspecified: Secondary | ICD-10-CM | POA: Insufficient documentation

## 2016-06-22 DIAGNOSIS — Z9981 Dependence on supplemental oxygen: Secondary | ICD-10-CM | POA: Diagnosis not present

## 2016-06-22 DIAGNOSIS — I252 Old myocardial infarction: Secondary | ICD-10-CM | POA: Insufficient documentation

## 2016-06-22 DIAGNOSIS — I5042 Chronic combined systolic (congestive) and diastolic (congestive) heart failure: Secondary | ICD-10-CM | POA: Diagnosis not present

## 2016-06-22 DIAGNOSIS — Z794 Long term (current) use of insulin: Secondary | ICD-10-CM | POA: Diagnosis not present

## 2016-06-22 DIAGNOSIS — Z951 Presence of aortocoronary bypass graft: Secondary | ICD-10-CM | POA: Insufficient documentation

## 2016-06-22 LAB — ECHOCARDIOGRAM COMPLETE
CHL CUP MV DEC (S): 164
CHL CUP RV SYS PRESS: 84 mmHg
E decel time: 164 msec
E/e' ratio: 14.95
FS: 39 % (ref 28–44)
IV/PV OW: 1.02
LA diam end sys: 51 mm
LA vol A4C: 72.2 ml
LA vol: 79.7 mL
LADIAMINDEX: 2.58 cm/m2
LASIZE: 51 mm
LAVOLIN: 40.3 mL/m2
LV PW d: 12.1 mm — AB (ref 0.6–1.1)
LV SIMPSON'S DISK: 59
LV dias vol: 91 mL (ref 46–106)
LV sys vol: 37 mL (ref 14–42)
LVDIAVOLIN: 46 mL/m2
LVEEAVG: 14.95
LVEEMED: 14.95
LVELAT: 9.03 cm/s
LVOT VTI: 22.4 cm
LVOT area: 3.46 cm2
LVOT peak grad rest: 3 mmHg
LVOTD: 21 mm
LVOTPV: 84.5 cm/s
LVOTSV: 78 mL
LVSYSVOLIN: 19 mL/m2
Lateral S' vel: 9.79 cm/s
MV pk A vel: 52.1 m/s
MVPG: 7 mmHg
MVPKEVEL: 135 m/s
RV TAPSE: 9.23 mm
Reg peak vel: 451 cm/s
Stroke v: 54 ml
TDI e' lateral: 9.03
TDI e' medial: 4.9
TR max vel: 451 cm/s
Weight: 3068.8 oz

## 2016-06-22 MED ORDER — LOSARTAN POTASSIUM 25 MG PO TABS: 12.5000 mg | ORAL_TABLET | Freq: Every day | ORAL | 3 refills | Status: DC

## 2016-06-22 NOTE — Progress Notes (Signed)
  Echocardiogram 2D Echocardiogram has been performed.  Johny Chess 06/22/2016, 11:03 AM

## 2016-06-22 NOTE — Patient Instructions (Signed)
Start Losartan 12.5 mg (1/2 tab) daily  Your physician recommends that you schedule a follow-up appointment in: 3 months

## 2016-06-22 NOTE — Progress Notes (Signed)
Advanced Heart Failure Clinic Note   PCP: Dr. Woody Seller Cardiology: Dr. Domenic Polite HF Cardiology: Dr. Aundra Dubin  71 yo with history of chronic diastolic CHF, CAD s/p CABG, chronic right pleural effusion, CKD stage III, and PAD presents for CHF clinic evaluation.  She has had multiple admissions with diastolic CHF complicated by CKD.  She was admitted in 7/17 for diuresis.  Worsening renal function was noted.  Lisinopril was stopped with elevated creatinine and Coreg was stopped with bradycardia.  Last echo in 7/17 showed EF 55-60% with grade II diastolic dysfunction and D-shaped interventricular septum.  She has been wearing home oxygen for > 1 year.    She was admitted again in 9/17 with volume overload.  She was diuresed extensively.  Pleurx catheter was removed from right chest.  RHC showed elevated right and left heart filling pressure with pulmonary venous hypertension.  She was diagnosed with hyperthyroidism and started on methimazole.  She was seen by Dr Dorris Fetch (endocrinology) and thought to have subacute thyroiditis rather than Graves disease.  She is now off methimazole and thyroid indices have normalized.   After discharge, in 10/17 she had peripheral angiography PV => right SFA and right tibioperoneal trunk angioplasties.  She now has only rare claudication.     She presents today for HF follow up. Overall feeling well, weights at home 185-187 pounds. Taking all medications as prescribed. Drinking less than 2L a day, eating a low salt diet. She remains on home oxygen. She is largely inactive at home, does move some but does move throughout the house without SOB. No SOB with ADL's.   Labs (7/17): K 4.2, creatinine 2.04 Labs (10/17): K 4.5, creatinine 1.68, HCT 30.5 Labs (11/17): K 3.9, creatinine 1.78, LDL 36 Labs (12/17): K 3.9, creatinine 1.49 => 1.53  PMH: 1. Chronic diastolic CHF: Multiple admissions.  - Echo (7/17) with EF 55-60%, grade II diastolic dysfunction, D-shaped interventricular  septum, mild MR, PASP 52 mmHg.  - Uses home oxygen.  - RHC (9/17): mean 15, PA 73/26 mean 42, mean PCWP 25, CI 2.84, PVR 3.0 WU => pulmonary venous hypertension.  2. H/o CVA 3. Recurrent right pleural effusion: S/p Pleurx catheter placement.  4. Depression 5. HTN 6. Carotid stenosis: Bilateral CEAs 7. Type II diabetes.  8. CAD: NSTEMI 11/12, had CABG with LIMA-LAD, SVG-PLV, SVG-OM, SVG-D.   - LHC (9/15) with DES to SVG-D.  Other grafts patent.  9. PAD: Left femoral-popliteal bypass 9/15.  - peripheral arterial dopplers (2/17) with right ABI 0.53, left ABI 1.02.  - 10/17 right SFA atherectomy and right tibioperoneal trunk atherectomy.  10. CKD stage III. Lisinopril stopped with increased creatinine.  11. Bradycardia: Stopped Coreg.  12. Hyperthyroidism: Diagnosed in 9/17. Subacute thyroiditis.  Followed by Dr. Dorris Fetch.  13. Sleep study 12/17 without significant OSA.   SH: Lives in [place] with son.  Prior smoker, quit 2012.  Retired.   Family History  Problem Relation Age of Onset  . Coronary artery disease Father        Died with MI age 63  . Heart disease Father        before age 70  . Heart attack Father   . Diabetes type II Mother   . Hypertension Mother   . Diabetes Mother   . Heart disease Mother   . Hyperlipidemia Mother   . Heart failure Sister   . Cancer Sister   . Cancer Brother        Lung cancer  . Diabetes  Daughter   . Hyperlipidemia Daughter   . Diabetes Son   . Hyperlipidemia Son    Review of systems complete and found to be negative unless listed in HPI.    BP (!) 132/58   Pulse (!) 54   Wt 191 lb 12.8 oz (87 kg)   SpO2 94% Comment: 4 L  BMI 30.96 kg/m    Wt Readings from Last 3 Encounters:  06/22/16 191 lb 12.8 oz (87 kg)  05/12/16 195 lb (88.5 kg)  03/30/16 189 lb (85.7 kg)    Current Outpatient Prescriptions on File Prior to Encounter  Medication Sig Dispense Refill  . acetaminophen (TYLENOL) 500 MG tablet Take 500-1,000 mg by mouth every 6 (six)  hours as needed for mild pain or moderate pain.     [name] albuterol (PROVENTIL HFA;VENTOLIN HFA) 108 (90 BASE) MCG/ACT inhaler Inhale 2 puffs into the lungs daily as needed for wheezing or shortness of breath.     [name] amLODipine (NORVASC) 10 MG tablet TAKE ONE TABLET BY MOUTH ONCE DAILY 90 tablet 3  . aspirin 81 MG EC tablet Take 81 mg by mouth daily.    [name] atorvastatin (LIPITOR) 40 MG tablet Take 40 mg by mouth daily at 6 PM.    . citalopram (CELEXA) 40 MG tablet Take 40 mg by mouth every morning.     . clopidogrel (PLAVIX) 75 MG tablet TAKE ONE TABLET BY MOUTH ONCE DAILY WITH  BREAKFAST 30 tablet 6  . clotrimazole-betamethasone (LOTRISONE) cream Apply 1 application topically 2 (two) times daily as needed.    Mariane Baumgarten Calcium (STOOL SOFTENER PO) Take 200 mg by mouth at bedtime as needed (for constipation).     . gabapentin (NEURONTIN) 300 MG capsule Take 300-600 mg by mouth See admin instructions. 300 mg in the morning and 600 mg prior to bed(time)    . hydrALAZINE (APRESOLINE) 50 MG tablet TAKE ONE & ONE-HALF TABLETS BY MOUTH THREE TIMES DAILY 100 tablet 3  . HYDROcodone-acetaminophen (NORCO/VICODIN) 5-325 MG tablet Take 1 tablet by mouth 3 (three) times daily as needed.    . insulin aspart (NOVOLOG) 100 UNIT/ML injection Inject 3 Units into the skin 3 (three) times daily with meals. (Patient taking differently: Inject 3-7 Units into the skin 3 (three) times daily after meals. Per sliding scale) 10 mL 11  . insulin detemir (LEVEMIR) 100 UNIT/ML injection Inject 7 Units into the skin at bedtime.    . isosorbide mononitrate (IMDUR) 30 MG 24 hr tablet TAKE ONE TABLET BY MOUTH ONCE DAILY 30 tablet 3  . Levothyroxine Sodium 50 MCG CAPS Take 1 capsule (50 mcg total) by mouth daily before breakfast. 30 capsule 3  . Multiple Vitamin (MULTIVITAMIN WITH MINERALS) TABS tablet Take 1 tablet by mouth daily.    . nitroGLYCERIN (NITROSTAT) 0.4 MG SL tablet Place 1 tablet (0.4 mg total) under the tongue every 5 (five)  minutes as needed for chest pain. Up to 3 doses. If no relief after 3rd dose, proceed to the ED for an evaluation 25 tablet 3  . nystatin (MYCOSTATIN/NYSTOP) powder Apply 1 application topically 2 (two) times daily as needed.    . potassium chloride SA (K-DUR,KLOR-CON) 20 MEQ tablet Take 1 tablet (20 mEq total) by mouth 2 (two) times daily. 60 tablet 3  . spironolactone (ALDACTONE) 25 MG tablet TAKE ONE-HALF TABLET BY MOUTH ONCE DAILY 16 tablet 6  . tiZANidine (ZANAFLEX) 4 MG tablet Take 4 mg by mouth 2 (two) times daily as needed for muscle  spasms.    [name] torsemide (DEMADEX) 20 MG tablet Take 60 mg(3 tabs) in the AM and 40 mg (2 tabs) in the PM May take an additional tablet if weight increases 195 tablet 3  . umeclidinium-vilanterol (ANORO ELLIPTA) 62.5-25 MCG/INH AEPB Inhale 1 puff into the lungs daily.     No current facility-administered medications on file prior to encounter.    General: Female, arrived in wheelchair. Daughter present.  Neck: supple.JVP 5-6 cm, No thyromegaly or nodule noted.   Lungs: Clear, normal effort.  CV: Nondisplaced PMI.  Regular rate and rhythm. 1/6 SEM RUSB.  No carotid bruit. Trace bilateral pedal edema.  Abdomen: Soft, NT, ND, no HSM. No bruits or masses. Bowel sounds present.  Skin: Intact without lesions or rashes.  Neurologic: Alert & oriented x 3. Cranial nerves grossly intact. Moves all 4 extremities w/o difficulty. Affect pleasant    Psych: Normal affect.  Extremities: No clubbing/cyanosis.   HEENT: Normal.   Assessment/plan: 1. Chronic diastolic CHF: Echo EF 09-62%, mildly dilated RV. PA pressure  - NYHA III.  - Volume status stable. Continue torsemide 74m in the am, 426min the pm.  - KCl 40 mEq, recent BMET stable.  - no beta blocker with bradycardia  - Reinforced fluid restriction to < 2 L daily, sodium restriction to less than 2000 mg daily, and the importance of daily weights.   2. Pulmonary HTN - Right heart cath in Sept. 2017 with PA 73/26  mean 42. PVR 3.0 WU.  - Related to chronic respiratory failure - Sleep study negative for OSA.  - Normal PFT's before CABG in 2012 - Consider VQ scan for CTEPH, she is sedentary.  3. CKD stage III:  - Stable. Baseline 1.6-1.9 4. CAD: s/p CABG in 2012, history of PCI to SVG to diag. In 09/2013 - Denies chest pain. Continue ASA, Plavix and statin.  5. Recurrent right pleural effusion:  - Stable, no recurrence.  6. PAD: s/p recent right leg atherectomies.  - Minimal symptoms.  7. Hyperlipidemia:  - LDL 36 in Nov. 2017 8. Hyperthyroidism:  -Dr. NiDorris Fetchollowing. Stable.  9. HTN - Add losartan 12.4m64maily.    Follow up in 3 months. She will have a BMET at Dr. McDMyles Gipfice in 2 weeks.    EriArbutus LeasP  06/22/2016 11:10 AM

## 2016-06-23 ENCOUNTER — Ambulatory Visit (INDEPENDENT_AMBULATORY_CARE_PROVIDER_SITE_OTHER): Payer: Medicare Other | Admitting: "Endocrinology

## 2016-06-23 ENCOUNTER — Encounter: Payer: Self-pay | Admitting: "Endocrinology

## 2016-06-23 VITALS — BP 112/66 | HR 62 | Ht 66.0 in | Wt 193.0 lb

## 2016-06-23 DIAGNOSIS — I779 Disorder of arteries and arterioles, unspecified: Secondary | ICD-10-CM | POA: Diagnosis not present

## 2016-06-23 DIAGNOSIS — E039 Hypothyroidism, unspecified: Secondary | ICD-10-CM | POA: Diagnosis not present

## 2016-06-23 MED ORDER — LEVOTHYROXINE SODIUM 75 MCG PO TABS: 75.0000 ug | ORAL_TABLET | Freq: Every day | ORAL | 3 refills | Status: DC

## 2016-06-23 NOTE — Progress Notes (Signed)
Subjective:    Patient ID: [name] [name], female    DOB: 1945-08-29, PCP Glenda Chroman, MD.   Past Medical History:  Diagnosis Date  . Arthritis   . Carotid artery disease (HCC)    a. Bilateral CEA; 50-60% bilateral ICA stenosis, 11/12  . Cellulitis    a. Recurrent, bilateral legs  . CHF (congestive heart failure) ([place])   . Chronic back pain   . Chronic diastolic heart failure ([place])   . Coronary atherosclerosis of native coronary artery    a. 11/2010 NSTEMI/CABG x 4: L-LAD; S-PL; S-OM; S-DX, by PVT.  [name] Critical lower limb ischemia   . Depression   . Essential hypertension, benign   . Herniated disc   . History of blood transfusion   . Ischemic cardiomyopathy    a. 11/2010 TEE: EF 40-45%.  . Migraine   . Mixed hyperlipidemia   . NSTEMI (non-ST elevated myocardial infarction) ([place]) 11/2010  . On home oxygen therapy   . PAD (peripheral artery disease) ([place])   . Pneumonia 2000's X 2  . Stroke ([place]) 08/2009  . Suicide attempt ([place]) 08/2002  . TIA (transient ischemic attack)   . TMJ syndrome   . Type 2 diabetes mellitus ([place])   . Venous insufficiency    Past Surgical History:  Procedure Laterality Date  . ABDOMINAL HYSTERECTOMY    . APPENDECTOMY    . CARDIAC CATHETERIZATION  11/2010  . CARDIAC CATHETERIZATION N/A 09/16/2015   Procedure: Right Heart Cath;  Surgeon: Larey Dresser, MD;  Location: [facility];  Service: Cardiovascular;  Laterality: N/A;  . CAROTID ENDARTERECTOMY Bilateral 2011   Bilateral - Dr. Kellie Simmering  . CHOLECYSTECTOMY    . COLONOSCOPY W/ BIOPSIES AND POLYPECTOMY    . CORONARY ANGIOPLASTY WITH STENT PLACEMENT  03/2013   "1"  . CORONARY ARTERY BYPASS GRAFT  11/24/2010   Procedure: CORONARY ARTERY BYPASS GRAFTING (CABG);  Surgeon: Tharon Aquas Adelene Idler, MD;  Location: [facility];  Service: Open Heart Surgery;  Laterality: N/A;  Coronary Artery Bypass Graft times four on pump utilizing left internal mammary artery and bilateral greater saphenous veins  harvested endoscopically, transesophageal echocardiogram   . DEBRIDEMENT TOE Left    "nonhealing wound; 3rd digit  . DILATION AND CURETTAGE OF UTERUS    . ENDARTERECTOMY POPLITEAL Left 10/07/2013   Procedure: LEFT POPLITEAL ENDARTERECTOMY ;  Surgeon: Angelia Mould, MD;  Location: [facility];  Service: Vascular;  Laterality: Left;  . FEMORAL-POPLITEAL BYPASS GRAFT Left 10/07/2013   Procedure: LEFT FEMORAL-POPLITEAL ARTERY BYPASS GRAFT;  Surgeon: Angelia Mould, MD;  Location: [name];  Service: Vascular;  Laterality: Left;  . FRACTURE SURGERY    . INTRAOPERATIVE ARTERIOGRAM Left 10/07/2013   Procedure: INTRA OPERATIVE ARTERIOGRAM LEFT LEG;  Surgeon: Angelia Mould, MD;  Location: [facility];  Service: Vascular;  Laterality: Left;  . IR GENERIC HISTORICAL  09/21/2015   IR REMOVAL OF PLURAL CATH W/CUFF 09/21/2015 Marybelle Killings, MD [facility]-INTER[facility] RAD  . LEFT AND RIGHT HEART CATHETERIZATION WITH CORONARY/GRAFT ANGIOGRAM N/A 03/27/2013   Procedure: LEFT AND RIGHT HEART CATHETERIZATION WITH Beatrix Fetters;  Surgeon: Burnell Blanks, MD;  Location: [facility] CATH LAB;  Service: Cardiovascular;  Laterality: N/A;  . LOWER EXTREMITY ANGIOGRAM  09/08/2012; 10/06/2013   "found 100% blockage; unsuccessful attempt at crossing a chronic total occlusion of the left SFA in the setting of critical limb ischemia  . LOWER EXTREMITY ANGIOGRAM Bilateral 09/08/2013   Procedure: LOWER EXTREMITY ANGIOGRAM;  Surgeon:  [name], MD;  Location: [facility] CATH LAB;  Service: Cardiovascular;  Laterality: Bilateral;  . PERIPHERAL VASCULAR CATHETERIZATION  11/04/2015   Procedure: Peripheral Vascular Atherectomy;  Surgeon: [name], MD;  Location: [facility];  Service: Cardiovascular;;  RT. SFA  . PERIPHERAL VASCULAR CATHETERIZATION Bilateral 11/04/2015   Procedure: Lower Extremity Intervention;  Surgeon: [name], MD;  Location: [facility];  Service: Cardiovascular;  Laterality: Bilateral;   . PERIPHERAL VASCULAR CATHETERIZATION  11/04/2015   Procedure: Peripheral Vascular Balloon Angioplasty;  Surgeon: [name], MD;  Location: [facility];  Service: Cardiovascular;;  TP Trunk  . TOE SURGERY Left    "put pin in 2nd toe"  . TUBAL LIGATION    . WRIST FRACTURE SURGERY Left    "grafted bone from hip to wrist"   Social History   Social History  . Marital status: Widowed    Spouse name: N/A  . Number of children: N/A  . Years of education: N/A   Social History Main Topics  . Smoking status: Former Smoker    Packs/day: 3.00    Years: 50.00    Types: Cigarettes    Start date: 01/24/1956    Quit date: 10/09/2009  . Smokeless tobacco: Never Used  . Alcohol use 0.0 oz/week     Comment: 10/06/2013 "might have a drink q 2-3 months"  . Drug use: Yes    Types: Marijuana     Comment: "smoked pot in my teens"  . Sexual activity: Not Currently   Other Topics Concern  . None   Social History Narrative   Lives in [place] by herself.  She does not work.   Outpatient Encounter Prescriptions as of 06/23/2016  Medication Sig  . acetaminophen (TYLENOL) 500 MG tablet Take 500-1,000 mg by mouth every 6 (six) hours as needed for mild pain or moderate pain.   [name] albuterol (PROVENTIL HFA;VENTOLIN HFA) 108 (90 BASE) MCG/ACT inhaler Inhale 2 puffs into the lungs daily as needed for wheezing or shortness of breath.   [name] amLODipine (NORVASC) 10 MG tablet TAKE ONE TABLET BY MOUTH ONCE DAILY  . aspirin 81 MG EC tablet Take 81 mg by mouth daily.  [name] atorvastatin (LIPITOR) 40 MG tablet Take 40 mg by mouth daily at 6 PM.  . citalopram (CELEXA) 40 MG tablet Take 40 mg by mouth every morning.   . clopidogrel (PLAVIX) 75 MG tablet TAKE ONE TABLET BY MOUTH ONCE DAILY WITH  BREAKFAST  . clotrimazole-betamethasone (LOTRISONE) cream Apply 1 application topically 2 (two) times daily as needed.  Mariane Baumgarten Calcium (STOOL SOFTENER PO) Take 200 mg by mouth at bedtime as needed (for constipation).    . gabapentin (NEURONTIN) 300 MG capsule Take 300-600 mg by mouth See admin instructions. 300 mg in the morning and 600 mg prior to bed(time)  . hydrALAZINE (APRESOLINE) 50 MG tablet TAKE ONE & ONE-HALF TABLETS BY MOUTH THREE TIMES DAILY  . HYDROcodone-acetaminophen (NORCO/VICODIN) 5-325 MG tablet Take 1 tablet by mouth 3 (three) times daily as needed.  . insulin aspart (NOVOLOG) 100 UNIT/ML injection Inject 3 Units into the skin 3 (three) times daily with meals. (Patient taking differently: Inject 3-7 Units into the skin 3 (three) times daily after meals. Per sliding scale)  . insulin detemir (LEVEMIR) 100 UNIT/ML injection Inject 7 Units into the skin at bedtime.  . isosorbide mononitrate (IMDUR) 30 MG 24 hr tablet TAKE ONE TABLET BY MOUTH ONCE DAILY  . levothyroxine (SYNTHROID, LEVOTHROID)  75 MCG tablet Take 1 tablet (75 mcg total) by mouth daily before breakfast.  . losartan (COZAAR) 25 MG tablet Take 0.5 tablets (12.5 mg total) by mouth daily.  . Multiple Vitamin (MULTIVITAMIN WITH MINERALS) TABS tablet Take 1 tablet by mouth daily.  . nitroGLYCERIN (NITROSTAT) 0.4 MG SL tablet Place 1 tablet (0.4 mg total) under the tongue every 5 (five) minutes as needed for chest pain. Up to 3 doses. If no relief after 3rd dose, proceed to the ED for an evaluation  . nystatin (MYCOSTATIN/NYSTOP) powder Apply 1 application topically 2 (two) times daily as needed.  . potassium chloride SA (K-DUR,KLOR-CON) 20 MEQ tablet Take 1 tablet (20 mEq total) by mouth 2 (two) times daily.  [name] spironolactone (ALDACTONE) 25 MG tablet TAKE ONE-HALF TABLET BY MOUTH ONCE DAILY  . tiZANidine (ZANAFLEX) 4 MG tablet Take 4 mg by mouth 2 (two) times daily as needed for muscle spasms.  [name] torsemide (DEMADEX) 20 MG tablet Take 60 mg(3 tabs) in the AM and 40 mg (2 tabs) in the PM May take an additional tablet if weight increases  . umeclidinium-vilanterol (ANORO ELLIPTA) 62.5-25 MCG/INH AEPB Inhale 1 puff into the lungs daily.  .  [DISCONTINUED] Levothyroxine Sodium 50 MCG CAPS Take 1 capsule (50 mcg total) by mouth daily before breakfast.   No facility-administered encounter medications on file as of 06/23/2016.    ALLERGIES: Allergies  Allergen Reactions  . Other Shortness Of Breath, Rash and Other (See Comments)    CANNOT EAT ANY BERRIES!!  . Strawberry Extract Hives, Shortness Of Breath and Rash  . Sulfa Antibiotics Swelling and Other (See Comments)    Bodily Swelling  . Coffee Bean Extract [Coffea Arabica] Itching and Rash  . Tape Other (See Comments)    Tears skin.  Please use "paper" tape only.   VACCINATION STATUS: Immunization History  Administered Date(s) Administered  . Influenza,inj,Quad PF,36+ Mos 10/10/2013, 09/05/2014  . Pneumococcal Polysaccharide-23 03/21/2013, 09/05/2014    HPI  The patient presents today with a medical history as above, and is being seen in f/u  For hypothyroidism. She is on levothyroxine 50 g by mouth every morning. She  reports compliance. - She has no new complaints. She has more energy, denies cold intolerance. She has lost 8 pounds since last visit, a good development for her.   Review of Systems Constitutional:  weight loss, + fatigue, - hyperthermia Eyes: no blurry vision, +xerophthalmia ENT: no sore throat, no nodules palpated in throat, no dysphagia/odynophagia, no hoarseness Cardiovascular: no CP/SOB/palpitations/leg swelling Respiratory: + cough, +SOB Gastrointestinal: no N/V/D/C Musculoskeletal: no muscle/joint aches Skin: no rashes Neurological:  No tremors, - numbness/tingling, + dizziness Psychiatric: +anxiety  Objective:    BP 112/66   Pulse 62   Ht _0  (1.676 m)   Wt 193 lb (87.5 kg)   BMI 31.15 kg/m   Wt Readings from Last 3 Encounters:  06/23/16 193 lb (87.5 kg)  06/22/16 191 lb 12.8 oz (87 kg)  05/12/16 195 lb (88.5 kg)    Physical Exam Constitutional: overweight, in NAD, she uses oxygen per nasal cannula Eyes: PERRLA, EOMI,   ENT: moist mucous membranes, no thyromegaly, no cervical lymphadenopathy Cardiovascular: Distant heart sounds midline sternotomy scar from prior coronary artery bypass graft Respiratory: Tight  chest was poor air entry. Gastrointestinal: abdomen soft, NT, ND, BS+ Musculoskeletal: no deformities, strength intact in all 4 Skin: moist, warm, no rashes Neurological: -  tremor with outstretched hands, DTR normal in all 4   Complete  Blood Count (Most recent): Lab Results  Component Value Date   WBC 8.6 01/07/2016   HGB 11.5 (L) 01/07/2016   HCT 36.1 01/07/2016   MCV 81.9 01/07/2016   PLT 197 01/07/2016   Chemistry (most recent): Lab Results  Component Value Date   NA 134 (L) 05/15/2016   K 4.8 05/15/2016   CL 94 (L) 05/15/2016   CO2 29 05/15/2016   BUN 74 (H) 05/15/2016   CREATININE 1.90 (H) 05/15/2016   Diabetic Labs (most recent): Lab Results  Component Value Date   HGBA1C 6.4 (H) 09/14/2015   HGBA1C 10.8 (H) 03/18/2013   HGBA1C 8.1 (H) 11/22/2010   Results for [name] [name], [name] [name] (MRN [id number]) as of 06/23/2016 11:46  Ref. Range 01/17/2016 09:00 06/14/2016 10:12  TSH Latest Units: mIU/L 4.69 (H) 3.41  Triiodothyronine,Free,Serum Latest Ref Range: 2.3 - 4.2 pg/mL 2.3   T4,Free(Direct) Latest Ref Range: 0.8 - 1.8 ng/dL 0.8 1.0    Assessment & Plan:   1. Hypothyroidism:   - She has responded to therapy with levothyroxine. They do her labs, she would benefit from slight increase in the dose of her levothyroxine. - I will prescribe levothyroxine 75 g by mouth every morning.   - We discussed about correct intake of levothyroxine, at fasting, with water, separated by at least 30 minutes from breakfast, and separated by more than 4 hours from calcium, iron, multivitamins, acid reflux medications (PPIs). -Patient is made aware of the fact that thyroid hormone replacement is needed for life, dose to be adjusted by periodic monitoring of thyroid function tests.   - She has well  controlled type 2 diabetes with A1c of 6.4% following with her primary care doctor. - I advised patient to maintain close follow up with Glenda Chroman, MD for primary care needs.  Follow up plan: Return in about 4 months (around 10/23/2016) for follow up with pre-visit labs.  Glade Lloyd, MD Phone: [phone]  Fax: [phone]   06/23/2016, 11:44 AM

## 2016-06-26 DIAGNOSIS — E1142 Type 2 diabetes mellitus with diabetic polyneuropathy: Secondary | ICD-10-CM | POA: Diagnosis not present

## 2016-06-26 DIAGNOSIS — Z1211 Encounter for screening for malignant neoplasm of colon: Secondary | ICD-10-CM | POA: Diagnosis not present

## 2016-06-26 DIAGNOSIS — Z0000 Encounter for general adult medical examination without abnormal findings: Secondary | ICD-10-CM | POA: Diagnosis not present

## 2016-06-26 DIAGNOSIS — Z1389 Encounter for screening for other disorder: Secondary | ICD-10-CM | POA: Diagnosis not present

## 2016-06-26 DIAGNOSIS — Z7189 Other specified counseling: Secondary | ICD-10-CM | POA: Diagnosis not present

## 2016-06-26 DIAGNOSIS — Z6832 Body mass index (BMI) 32.0-32.9, adult: Secondary | ICD-10-CM | POA: Diagnosis not present

## 2016-06-26 DIAGNOSIS — R5383 Other fatigue: Secondary | ICD-10-CM | POA: Diagnosis not present

## 2016-06-26 DIAGNOSIS — I10 Essential (primary) hypertension: Secondary | ICD-10-CM | POA: Diagnosis not present

## 2016-06-26 DIAGNOSIS — I509 Heart failure, unspecified: Secondary | ICD-10-CM | POA: Diagnosis not present

## 2016-06-26 DIAGNOSIS — E1165 Type 2 diabetes mellitus with hyperglycemia: Secondary | ICD-10-CM | POA: Diagnosis not present

## 2016-06-26 DIAGNOSIS — Z299 Encounter for prophylactic measures, unspecified: Secondary | ICD-10-CM | POA: Diagnosis not present

## 2016-06-26 DIAGNOSIS — E039 Hypothyroidism, unspecified: Secondary | ICD-10-CM | POA: Diagnosis not present

## 2016-06-27 DIAGNOSIS — E785 Hyperlipidemia, unspecified: Secondary | ICD-10-CM | POA: Diagnosis not present

## 2016-06-27 DIAGNOSIS — Z79899 Other long term (current) drug therapy: Secondary | ICD-10-CM | POA: Diagnosis not present

## 2016-06-27 DIAGNOSIS — E039 Hypothyroidism, unspecified: Secondary | ICD-10-CM | POA: Diagnosis not present

## 2016-06-27 DIAGNOSIS — R5383 Other fatigue: Secondary | ICD-10-CM | POA: Diagnosis not present

## 2016-07-03 ENCOUNTER — Other Ambulatory Visit: Payer: Self-pay | Admitting: Cardiology

## 2016-07-20 DIAGNOSIS — E1151 Type 2 diabetes mellitus with diabetic peripheral angiopathy without gangrene: Secondary | ICD-10-CM | POA: Diagnosis not present

## 2016-07-20 DIAGNOSIS — I429 Cardiomyopathy, unspecified: Secondary | ICD-10-CM | POA: Diagnosis not present

## 2016-07-20 DIAGNOSIS — I5022 Chronic systolic (congestive) heart failure: Secondary | ICD-10-CM | POA: Diagnosis not present

## 2016-07-20 DIAGNOSIS — L03116 Cellulitis of left lower limb: Secondary | ICD-10-CM | POA: Diagnosis not present

## 2016-07-20 DIAGNOSIS — M7989 Other specified soft tissue disorders: Secondary | ICD-10-CM | POA: Diagnosis not present

## 2016-07-20 DIAGNOSIS — J9611 Chronic respiratory failure with hypoxia: Secondary | ICD-10-CM | POA: Diagnosis not present

## 2016-07-20 DIAGNOSIS — I2510 Atherosclerotic heart disease of native coronary artery without angina pectoris: Secondary | ICD-10-CM | POA: Diagnosis not present

## 2016-07-21 DIAGNOSIS — E039 Hypothyroidism, unspecified: Secondary | ICD-10-CM | POA: Diagnosis present

## 2016-07-21 DIAGNOSIS — E1140 Type 2 diabetes mellitus with diabetic neuropathy, unspecified: Secondary | ICD-10-CM | POA: Diagnosis present

## 2016-07-21 DIAGNOSIS — Z951 Presence of aortocoronary bypass graft: Secondary | ICD-10-CM | POA: Diagnosis not present

## 2016-07-21 DIAGNOSIS — E1151 Type 2 diabetes mellitus with diabetic peripheral angiopathy without gangrene: Secondary | ICD-10-CM | POA: Diagnosis present

## 2016-07-21 DIAGNOSIS — Z8673 Personal history of transient ischemic attack (TIA), and cerebral infarction without residual deficits: Secondary | ICD-10-CM | POA: Diagnosis not present

## 2016-07-21 DIAGNOSIS — Z8249 Family history of ischemic heart disease and other diseases of the circulatory system: Secondary | ICD-10-CM | POA: Diagnosis not present

## 2016-07-21 DIAGNOSIS — E1169 Type 2 diabetes mellitus with other specified complication: Secondary | ICD-10-CM | POA: Diagnosis not present

## 2016-07-21 DIAGNOSIS — F329 Major depressive disorder, single episode, unspecified: Secondary | ICD-10-CM | POA: Diagnosis present

## 2016-07-21 DIAGNOSIS — R0602 Shortness of breath: Secondary | ICD-10-CM | POA: Diagnosis not present

## 2016-07-21 DIAGNOSIS — I110 Hypertensive heart disease with heart failure: Secondary | ICD-10-CM | POA: Diagnosis present

## 2016-07-21 DIAGNOSIS — Z955 Presence of coronary angioplasty implant and graft: Secondary | ICD-10-CM | POA: Diagnosis not present

## 2016-07-21 DIAGNOSIS — M1990 Unspecified osteoarthritis, unspecified site: Secondary | ICD-10-CM | POA: Diagnosis present

## 2016-07-21 DIAGNOSIS — I10 Essential (primary) hypertension: Secondary | ICD-10-CM | POA: Diagnosis not present

## 2016-07-21 DIAGNOSIS — L03116 Cellulitis of left lower limb: Secondary | ICD-10-CM | POA: Diagnosis present

## 2016-07-21 DIAGNOSIS — E876 Hypokalemia: Secondary | ICD-10-CM | POA: Diagnosis not present

## 2016-07-21 DIAGNOSIS — M6281 Muscle weakness (generalized): Secondary | ICD-10-CM | POA: Diagnosis not present

## 2016-07-21 DIAGNOSIS — F339 Major depressive disorder, recurrent, unspecified: Secondary | ICD-10-CM | POA: Diagnosis not present

## 2016-07-21 DIAGNOSIS — Z833 Family history of diabetes mellitus: Secondary | ICD-10-CM | POA: Diagnosis not present

## 2016-07-21 DIAGNOSIS — J9611 Chronic respiratory failure with hypoxia: Secondary | ICD-10-CM | POA: Diagnosis present

## 2016-07-21 DIAGNOSIS — I739 Peripheral vascular disease, unspecified: Secondary | ICD-10-CM | POA: Diagnosis not present

## 2016-07-21 DIAGNOSIS — Z8701 Personal history of pneumonia (recurrent): Secondary | ICD-10-CM | POA: Diagnosis not present

## 2016-07-21 DIAGNOSIS — L03119 Cellulitis of unspecified part of limb: Secondary | ICD-10-CM | POA: Diagnosis not present

## 2016-07-21 DIAGNOSIS — Z7902 Long term (current) use of antithrombotics/antiplatelets: Secondary | ICD-10-CM | POA: Diagnosis not present

## 2016-07-21 DIAGNOSIS — R279 Unspecified lack of coordination: Secondary | ICD-10-CM | POA: Diagnosis not present

## 2016-07-21 DIAGNOSIS — I272 Pulmonary hypertension, unspecified: Secondary | ICD-10-CM | POA: Diagnosis present

## 2016-07-21 DIAGNOSIS — E7800 Pure hypercholesterolemia, unspecified: Secondary | ICD-10-CM | POA: Diagnosis present

## 2016-07-21 DIAGNOSIS — I2510 Atherosclerotic heart disease of native coronary artery without angina pectoris: Secondary | ICD-10-CM | POA: Diagnosis present

## 2016-07-21 DIAGNOSIS — I5022 Chronic systolic (congestive) heart failure: Secondary | ICD-10-CM | POA: Diagnosis present

## 2016-07-21 DIAGNOSIS — I429 Cardiomyopathy, unspecified: Secondary | ICD-10-CM | POA: Diagnosis present

## 2016-07-21 DIAGNOSIS — M24571 Contracture, right ankle: Secondary | ICD-10-CM | POA: Diagnosis not present

## 2016-07-21 DIAGNOSIS — Z23 Encounter for immunization: Secondary | ICD-10-CM | POA: Diagnosis not present

## 2016-07-21 DIAGNOSIS — I252 Old myocardial infarction: Secondary | ICD-10-CM | POA: Diagnosis not present

## 2016-07-21 DIAGNOSIS — Z7982 Long term (current) use of aspirin: Secondary | ICD-10-CM | POA: Diagnosis not present

## 2016-07-21 DIAGNOSIS — Z87891 Personal history of nicotine dependence: Secondary | ICD-10-CM | POA: Diagnosis not present

## 2016-07-21 DIAGNOSIS — R001 Bradycardia, unspecified: Secondary | ICD-10-CM | POA: Diagnosis not present

## 2016-07-21 DIAGNOSIS — F419 Anxiety disorder, unspecified: Secondary | ICD-10-CM | POA: Diagnosis present

## 2016-07-21 DIAGNOSIS — E785 Hyperlipidemia, unspecified: Secondary | ICD-10-CM | POA: Diagnosis not present

## 2016-07-21 DIAGNOSIS — I5042 Chronic combined systolic (congestive) and diastolic (congestive) heart failure: Secondary | ICD-10-CM | POA: Diagnosis not present

## 2016-07-21 DIAGNOSIS — R2689 Other abnormalities of gait and mobility: Secondary | ICD-10-CM | POA: Diagnosis not present

## 2016-07-21 DIAGNOSIS — D599 Acquired hemolytic anemia, unspecified: Secondary | ICD-10-CM | POA: Diagnosis not present

## 2016-07-25 DIAGNOSIS — I5042 Chronic combined systolic (congestive) and diastolic (congestive) heart failure: Secondary | ICD-10-CM | POA: Diagnosis not present

## 2016-07-25 DIAGNOSIS — R001 Bradycardia, unspecified: Secondary | ICD-10-CM | POA: Diagnosis not present

## 2016-07-25 DIAGNOSIS — I739 Peripheral vascular disease, unspecified: Secondary | ICD-10-CM | POA: Diagnosis not present

## 2016-07-25 DIAGNOSIS — E1165 Type 2 diabetes mellitus with hyperglycemia: Secondary | ICD-10-CM | POA: Diagnosis not present

## 2016-07-25 DIAGNOSIS — Z7902 Long term (current) use of antithrombotics/antiplatelets: Secondary | ICD-10-CM | POA: Diagnosis not present

## 2016-07-25 DIAGNOSIS — R7989 Other specified abnormal findings of blood chemistry: Secondary | ICD-10-CM | POA: Diagnosis not present

## 2016-07-25 DIAGNOSIS — E7800 Pure hypercholesterolemia, unspecified: Secondary | ICD-10-CM | POA: Diagnosis not present

## 2016-07-25 DIAGNOSIS — L03116 Cellulitis of left lower limb: Secondary | ICD-10-CM | POA: Diagnosis not present

## 2016-07-25 DIAGNOSIS — D599 Acquired hemolytic anemia, unspecified: Secondary | ICD-10-CM | POA: Diagnosis not present

## 2016-07-25 DIAGNOSIS — I2510 Atherosclerotic heart disease of native coronary artery without angina pectoris: Secondary | ICD-10-CM | POA: Diagnosis not present

## 2016-07-25 DIAGNOSIS — E785 Hyperlipidemia, unspecified: Secondary | ICD-10-CM | POA: Diagnosis not present

## 2016-07-25 DIAGNOSIS — E039 Hypothyroidism, unspecified: Secondary | ICD-10-CM | POA: Diagnosis not present

## 2016-07-25 DIAGNOSIS — J449 Chronic obstructive pulmonary disease, unspecified: Secondary | ICD-10-CM | POA: Diagnosis not present

## 2016-07-25 DIAGNOSIS — R0789 Other chest pain: Secondary | ICD-10-CM | POA: Diagnosis not present

## 2016-07-25 DIAGNOSIS — E876 Hypokalemia: Secondary | ICD-10-CM | POA: Diagnosis not present

## 2016-07-25 DIAGNOSIS — I10 Essential (primary) hypertension: Secondary | ICD-10-CM | POA: Diagnosis not present

## 2016-07-25 DIAGNOSIS — R0602 Shortness of breath: Secondary | ICD-10-CM | POA: Diagnosis not present

## 2016-07-25 DIAGNOSIS — M24571 Contracture, right ankle: Secondary | ICD-10-CM | POA: Diagnosis not present

## 2016-07-25 DIAGNOSIS — F329 Major depressive disorder, single episode, unspecified: Secondary | ICD-10-CM | POA: Diagnosis not present

## 2016-07-25 DIAGNOSIS — I429 Cardiomyopathy, unspecified: Secondary | ICD-10-CM | POA: Diagnosis not present

## 2016-07-25 DIAGNOSIS — R061 Stridor: Secondary | ICD-10-CM | POA: Diagnosis not present

## 2016-07-25 DIAGNOSIS — M6281 Muscle weakness (generalized): Secondary | ICD-10-CM | POA: Diagnosis not present

## 2016-07-25 DIAGNOSIS — I5022 Chronic systolic (congestive) heart failure: Secondary | ICD-10-CM | POA: Diagnosis not present

## 2016-07-25 DIAGNOSIS — E1169 Type 2 diabetes mellitus with other specified complication: Secondary | ICD-10-CM | POA: Diagnosis not present

## 2016-07-25 DIAGNOSIS — R0902 Hypoxemia: Secondary | ICD-10-CM | POA: Diagnosis not present

## 2016-07-25 DIAGNOSIS — Z9981 Dependence on supplemental oxygen: Secondary | ICD-10-CM | POA: Diagnosis not present

## 2016-07-25 DIAGNOSIS — R279 Unspecified lack of coordination: Secondary | ICD-10-CM | POA: Diagnosis not present

## 2016-07-25 DIAGNOSIS — Z79899 Other long term (current) drug therapy: Secondary | ICD-10-CM | POA: Diagnosis not present

## 2016-07-25 DIAGNOSIS — J9611 Chronic respiratory failure with hypoxia: Secondary | ICD-10-CM | POA: Diagnosis not present

## 2016-07-25 DIAGNOSIS — I110 Hypertensive heart disease with heart failure: Secondary | ICD-10-CM | POA: Diagnosis not present

## 2016-07-25 DIAGNOSIS — I272 Pulmonary hypertension, unspecified: Secondary | ICD-10-CM | POA: Diagnosis not present

## 2016-07-25 DIAGNOSIS — Z8673 Personal history of transient ischemic attack (TIA), and cerebral infarction without residual deficits: Secondary | ICD-10-CM | POA: Diagnosis not present

## 2016-07-25 DIAGNOSIS — R110 Nausea: Secondary | ICD-10-CM | POA: Diagnosis not present

## 2016-07-25 DIAGNOSIS — F339 Major depressive disorder, recurrent, unspecified: Secondary | ICD-10-CM | POA: Diagnosis not present

## 2016-07-25 DIAGNOSIS — F419 Anxiety disorder, unspecified: Secondary | ICD-10-CM | POA: Diagnosis not present

## 2016-07-25 DIAGNOSIS — I5020 Unspecified systolic (congestive) heart failure: Secondary | ICD-10-CM | POA: Diagnosis not present

## 2016-07-25 DIAGNOSIS — I252 Old myocardial infarction: Secondary | ICD-10-CM | POA: Diagnosis not present

## 2016-07-25 DIAGNOSIS — M549 Dorsalgia, unspecified: Secondary | ICD-10-CM | POA: Diagnosis not present

## 2016-07-25 DIAGNOSIS — E1140 Type 2 diabetes mellitus with diabetic neuropathy, unspecified: Secondary | ICD-10-CM | POA: Diagnosis not present

## 2016-07-25 DIAGNOSIS — M79602 Pain in left arm: Secondary | ICD-10-CM | POA: Diagnosis not present

## 2016-07-25 DIAGNOSIS — M1712 Unilateral primary osteoarthritis, left knee: Secondary | ICD-10-CM | POA: Diagnosis not present

## 2016-07-25 DIAGNOSIS — R2689 Other abnormalities of gait and mobility: Secondary | ICD-10-CM | POA: Diagnosis not present

## 2016-07-25 DIAGNOSIS — Z951 Presence of aortocoronary bypass graft: Secondary | ICD-10-CM | POA: Diagnosis not present

## 2016-07-25 DIAGNOSIS — Z955 Presence of coronary angioplasty implant and graft: Secondary | ICD-10-CM | POA: Diagnosis not present

## 2016-07-25 DIAGNOSIS — R079 Chest pain, unspecified: Secondary | ICD-10-CM | POA: Diagnosis not present

## 2016-07-27 DIAGNOSIS — R0602 Shortness of breath: Secondary | ICD-10-CM | POA: Diagnosis not present

## 2016-07-27 DIAGNOSIS — L03116 Cellulitis of left lower limb: Secondary | ICD-10-CM | POA: Diagnosis not present

## 2016-07-27 DIAGNOSIS — I10 Essential (primary) hypertension: Secondary | ICD-10-CM | POA: Diagnosis not present

## 2016-07-27 DIAGNOSIS — R110 Nausea: Secondary | ICD-10-CM | POA: Diagnosis not present

## 2016-07-27 DIAGNOSIS — R7989 Other specified abnormal findings of blood chemistry: Secondary | ICD-10-CM | POA: Diagnosis not present

## 2016-07-27 DIAGNOSIS — I429 Cardiomyopathy, unspecified: Secondary | ICD-10-CM | POA: Diagnosis not present

## 2016-07-27 DIAGNOSIS — Z951 Presence of aortocoronary bypass graft: Secondary | ICD-10-CM | POA: Diagnosis not present

## 2016-07-27 DIAGNOSIS — Z955 Presence of coronary angioplasty implant and graft: Secondary | ICD-10-CM | POA: Diagnosis not present

## 2016-07-27 DIAGNOSIS — E1169 Type 2 diabetes mellitus with other specified complication: Secondary | ICD-10-CM | POA: Diagnosis not present

## 2016-07-27 DIAGNOSIS — R079 Chest pain, unspecified: Secondary | ICD-10-CM | POA: Diagnosis not present

## 2016-07-27 DIAGNOSIS — I2510 Atherosclerotic heart disease of native coronary artery without angina pectoris: Secondary | ICD-10-CM | POA: Diagnosis not present

## 2016-07-27 DIAGNOSIS — E1165 Type 2 diabetes mellitus with hyperglycemia: Secondary | ICD-10-CM | POA: Diagnosis not present

## 2016-07-28 ENCOUNTER — Inpatient Hospital Stay (HOSPITAL_COMMUNITY)
Admission: AD | Admit: 2016-07-28 | Discharge: 2016-08-03 | DRG: 291 | Disposition: A | Discharge status: Skilled Nursing Facility | Payer: Medicare Other | Source: Other Acute Inpatient Hospital | Attending: Cardiology | Admitting: Cardiology

## 2016-07-28 ENCOUNTER — Encounter (HOSPITAL_COMMUNITY): Payer: Self-pay | Admitting: General Practice

## 2016-07-28 DIAGNOSIS — F329 Major depressive disorder, single episode, unspecified: Secondary | ICD-10-CM | POA: Diagnosis present

## 2016-07-28 DIAGNOSIS — I5033 Acute on chronic diastolic (congestive) heart failure: Secondary | ICD-10-CM | POA: Diagnosis present

## 2016-07-28 DIAGNOSIS — E0590 Thyrotoxicosis, unspecified without thyrotoxic crisis or storm: Secondary | ICD-10-CM | POA: Diagnosis present

## 2016-07-28 DIAGNOSIS — J9621 Acute and chronic respiratory failure with hypoxia: Secondary | ICD-10-CM | POA: Diagnosis not present

## 2016-07-28 DIAGNOSIS — E1151 Type 2 diabetes mellitus with diabetic peripheral angiopathy without gangrene: Secondary | ICD-10-CM | POA: Diagnosis not present

## 2016-07-28 DIAGNOSIS — R2689 Other abnormalities of gait and mobility: Secondary | ICD-10-CM | POA: Diagnosis not present

## 2016-07-28 DIAGNOSIS — IMO0002 Reserved for concepts with insufficient information to code with codable children: Secondary | ICD-10-CM | POA: Diagnosis present

## 2016-07-28 DIAGNOSIS — R079 Chest pain, unspecified: Secondary | ICD-10-CM | POA: Diagnosis present

## 2016-07-28 DIAGNOSIS — I255 Ischemic cardiomyopathy: Secondary | ICD-10-CM | POA: Diagnosis present

## 2016-07-28 DIAGNOSIS — I272 Pulmonary hypertension, unspecified: Secondary | ICD-10-CM | POA: Diagnosis present

## 2016-07-28 DIAGNOSIS — G8929 Other chronic pain: Secondary | ICD-10-CM | POA: Diagnosis not present

## 2016-07-28 DIAGNOSIS — I2510 Atherosclerotic heart disease of native coronary artery without angina pectoris: Secondary | ICD-10-CM | POA: Diagnosis present

## 2016-07-28 DIAGNOSIS — E669 Obesity, unspecified: Secondary | ICD-10-CM | POA: Diagnosis present

## 2016-07-28 DIAGNOSIS — R001 Bradycardia, unspecified: Secondary | ICD-10-CM | POA: Diagnosis not present

## 2016-07-28 DIAGNOSIS — Z882 Allergy status to sulfonamides status: Secondary | ICD-10-CM | POA: Diagnosis not present

## 2016-07-28 DIAGNOSIS — I872 Venous insufficiency (chronic) (peripheral): Secondary | ICD-10-CM | POA: Diagnosis present

## 2016-07-28 DIAGNOSIS — Z9981 Dependence on supplemental oxygen: Secondary | ICD-10-CM | POA: Diagnosis not present

## 2016-07-28 DIAGNOSIS — R278 Other lack of coordination: Secondary | ICD-10-CM | POA: Diagnosis not present

## 2016-07-28 DIAGNOSIS — E1122 Type 2 diabetes mellitus with diabetic chronic kidney disease: Secondary | ICD-10-CM | POA: Diagnosis present

## 2016-07-28 DIAGNOSIS — I5031 Acute diastolic (congestive) heart failure: Secondary | ICD-10-CM

## 2016-07-28 DIAGNOSIS — N183 Chronic kidney disease, stage 3 (moderate): Secondary | ICD-10-CM | POA: Diagnosis not present

## 2016-07-28 DIAGNOSIS — Z91018 Allergy to other foods: Secondary | ICD-10-CM

## 2016-07-28 DIAGNOSIS — Z794 Long term (current) use of insulin: Secondary | ICD-10-CM

## 2016-07-28 DIAGNOSIS — J90 Pleural effusion, not elsewhere classified: Secondary | ICD-10-CM | POA: Diagnosis present

## 2016-07-28 DIAGNOSIS — E782 Mixed hyperlipidemia: Secondary | ICD-10-CM | POA: Diagnosis present

## 2016-07-28 DIAGNOSIS — M79606 Pain in leg, unspecified: Secondary | ICD-10-CM | POA: Diagnosis present

## 2016-07-28 DIAGNOSIS — E1165 Type 2 diabetes mellitus with hyperglycemia: Secondary | ICD-10-CM | POA: Diagnosis not present

## 2016-07-28 DIAGNOSIS — J449 Chronic obstructive pulmonary disease, unspecified: Secondary | ICD-10-CM | POA: Diagnosis present

## 2016-07-28 DIAGNOSIS — Z90710 Acquired absence of both cervix and uterus: Secondary | ICD-10-CM | POA: Diagnosis not present

## 2016-07-28 DIAGNOSIS — I130 Hypertensive heart and chronic kidney disease with heart failure and stage 1 through stage 4 chronic kidney disease, or unspecified chronic kidney disease: Secondary | ICD-10-CM | POA: Diagnosis not present

## 2016-07-28 DIAGNOSIS — M25559 Pain in unspecified hip: Secondary | ICD-10-CM | POA: Diagnosis present

## 2016-07-28 DIAGNOSIS — I160 Hypertensive urgency: Secondary | ICD-10-CM | POA: Diagnosis not present

## 2016-07-28 DIAGNOSIS — Z8249 Family history of ischemic heart disease and other diseases of the circulatory system: Secondary | ICD-10-CM

## 2016-07-28 DIAGNOSIS — I739 Peripheral vascular disease, unspecified: Secondary | ICD-10-CM | POA: Diagnosis present

## 2016-07-28 DIAGNOSIS — I252 Old myocardial infarction: Secondary | ICD-10-CM

## 2016-07-28 DIAGNOSIS — E785 Hyperlipidemia, unspecified: Secondary | ICD-10-CM | POA: Diagnosis not present

## 2016-07-28 DIAGNOSIS — J80 Acute respiratory distress syndrome: Secondary | ICD-10-CM | POA: Diagnosis not present

## 2016-07-28 DIAGNOSIS — I5040 Unspecified combined systolic (congestive) and diastolic (congestive) heart failure: Secondary | ICD-10-CM | POA: Diagnosis not present

## 2016-07-28 DIAGNOSIS — Z955 Presence of coronary angioplasty implant and graft: Secondary | ICD-10-CM | POA: Diagnosis not present

## 2016-07-28 DIAGNOSIS — E039 Hypothyroidism, unspecified: Secondary | ICD-10-CM | POA: Diagnosis not present

## 2016-07-28 DIAGNOSIS — R7989 Other specified abnormal findings of blood chemistry: Secondary | ICD-10-CM | POA: Diagnosis not present

## 2016-07-28 DIAGNOSIS — Z6831 Body mass index (BMI) 31.0-31.9, adult: Secondary | ICD-10-CM

## 2016-07-28 DIAGNOSIS — Z8701 Personal history of pneumonia (recurrent): Secondary | ICD-10-CM

## 2016-07-28 DIAGNOSIS — Z951 Presence of aortocoronary bypass graft: Secondary | ICD-10-CM | POA: Diagnosis not present

## 2016-07-28 DIAGNOSIS — Z8673 Personal history of transient ischemic attack (TIA), and cerebral infarction without residual deficits: Secondary | ICD-10-CM

## 2016-07-28 DIAGNOSIS — R0602 Shortness of breath: Secondary | ICD-10-CM | POA: Diagnosis not present

## 2016-07-28 DIAGNOSIS — I150 Renovascular hypertension: Secondary | ICD-10-CM | POA: Diagnosis not present

## 2016-07-28 DIAGNOSIS — Z91048 Other nonmedicinal substance allergy status: Secondary | ICD-10-CM | POA: Diagnosis not present

## 2016-07-28 DIAGNOSIS — Z9049 Acquired absence of other specified parts of digestive tract: Secondary | ICD-10-CM

## 2016-07-28 DIAGNOSIS — Z7982 Long term (current) use of aspirin: Secondary | ICD-10-CM

## 2016-07-28 DIAGNOSIS — M6281 Muscle weakness (generalized): Secondary | ICD-10-CM | POA: Diagnosis not present

## 2016-07-28 DIAGNOSIS — E061 Subacute thyroiditis: Secondary | ICD-10-CM | POA: Diagnosis present

## 2016-07-28 DIAGNOSIS — Z79899 Other long term (current) drug therapy: Secondary | ICD-10-CM

## 2016-07-28 DIAGNOSIS — R748 Abnormal levels of other serum enzymes: Secondary | ICD-10-CM | POA: Diagnosis not present

## 2016-07-28 DIAGNOSIS — I10 Essential (primary) hypertension: Secondary | ICD-10-CM | POA: Diagnosis present

## 2016-07-28 DIAGNOSIS — Z87891 Personal history of nicotine dependence: Secondary | ICD-10-CM

## 2016-07-28 DIAGNOSIS — M79609 Pain in unspecified limb: Secondary | ICD-10-CM | POA: Diagnosis not present

## 2016-07-28 DIAGNOSIS — Z9109 Other allergy status, other than to drugs and biological substances: Secondary | ICD-10-CM

## 2016-07-28 DIAGNOSIS — I5032 Chronic diastolic (congestive) heart failure: Secondary | ICD-10-CM | POA: Diagnosis present

## 2016-07-28 DIAGNOSIS — Z7902 Long term (current) use of antithrombotics/antiplatelets: Secondary | ICD-10-CM

## 2016-07-28 HISTORY — DX: Pleural effusion, not elsewhere classified: J90

## 2016-07-28 HISTORY — DX: Hypothyroidism, unspecified: E03.9

## 2016-07-28 LAB — COMPREHENSIVE METABOLIC PANEL
ALBUMIN: 3.5 g/dL (ref 3.5–5.0)
ALT: 15 U/L (ref 14–54)
AST: 20 U/L (ref 15–41)
Alkaline Phosphatase: 63 U/L (ref 38–126)
Anion gap: 8 (ref 5–15)
BUN: 25 mg/dL — AB (ref 6–20)
CHLORIDE: 108 mmol/L (ref 101–111)
CO2: 23 mmol/L (ref 22–32)
CREATININE: 1.26 mg/dL — AB (ref 0.44–1.00)
Calcium: 8.7 mg/dL — ABNORMAL LOW (ref 8.9–10.3)
GFR calc Af Amer: 49 mL/min — ABNORMAL LOW (ref >60)
GFR, EST NON AFRICAN AMERICAN: 42 mL/min — AB (ref >60)
Glucose, Bld: 155 mg/dL — ABNORMAL HIGH (ref 65–99)
POTASSIUM: 5 mmol/L (ref 3.5–5.1)
SODIUM: 139 mmol/L (ref 135–145)
Total Bilirubin: 0.7 mg/dL (ref 0.3–1.2)
Total Protein: 7 g/dL (ref 6.5–8.1)

## 2016-07-28 LAB — GLUCOSE, CAPILLARY
GLUCOSE-CAPILLARY: 148 mg/dL — AB (ref 65–99)
GLUCOSE-CAPILLARY: 146 mg/dL — AB (ref 65–99)
Glucose-Capillary: 139 mg/dL — ABNORMAL HIGH (ref 65–99)

## 2016-07-28 LAB — CBC WITH DIFFERENTIAL/PLATELET
BASOS ABS: 0 10*3/uL (ref 0.0–0.1)
Basophils Relative: 0 %
EOS ABS: 0.1 10*3/uL (ref 0.0–0.7)
EOS PCT: 2 %
HCT: 35.9 % — ABNORMAL LOW (ref 36.0–46.0)
HEMOGLOBIN: 10.8 g/dL — AB (ref 12.0–15.0)
LYMPHS PCT: 15 %
Lymphs Abs: 0.8 10*3/uL (ref 0.7–4.0)
MCH: 26.4 pg (ref 26.0–34.0)
MCHC: 30.1 g/dL (ref 30.0–36.0)
MCV: 87.8 fL (ref 78.0–100.0)
Monocytes Absolute: 0.2 10*3/uL (ref 0.1–1.0)
Monocytes Relative: 4 %
NEUTROS PCT: 79 %
Neutro Abs: 4.1 10*3/uL (ref 1.7–7.7)
PLATELETS: 157 10*3/uL (ref 150–400)
RBC: 4.09 MIL/uL (ref 3.87–5.11)
RDW: 16.6 % — ABNORMAL HIGH (ref 11.5–15.5)
WBC: 5.2 10*3/uL (ref 4.0–10.5)

## 2016-07-28 LAB — TSH: TSH: 1.323 u[IU]/mL (ref 0.350–4.500)

## 2016-07-28 LAB — TROPONIN I
TROPONIN I: 0.03 ng/mL — AB (ref <0.03)
TROPONIN I: 0.03 ng/mL — AB (ref <0.03)

## 2016-07-28 LAB — CREATININE, SERUM
Creatinine, Ser: 1.27 mg/dL — ABNORMAL HIGH (ref 0.44–1.00)
GFR calc Af Amer: 49 mL/min — ABNORMAL LOW (ref >60)
GFR, EST NON AFRICAN AMERICAN: 42 mL/min — AB (ref >60)

## 2016-07-28 LAB — CBC
HEMATOCRIT: 35.1 % — AB (ref 36.0–46.0)
HEMOGLOBIN: 10.7 g/dL — AB (ref 12.0–15.0)
MCH: 26.6 pg (ref 26.0–34.0)
MCHC: 30.5 g/dL (ref 30.0–36.0)
MCV: 87.3 fL (ref 78.0–100.0)
Platelets: 166 10*3/uL (ref 150–400)
RBC: 4.02 MIL/uL (ref 3.87–5.11)
RDW: 16.5 % — ABNORMAL HIGH (ref 11.5–15.5)
WBC: 6.2 10*3/uL (ref 4.0–10.5)

## 2016-07-28 LAB — MRSA PCR SCREENING: MRSA BY PCR: NEGATIVE

## 2016-07-28 LAB — BRAIN NATRIURETIC PEPTIDE: B Natriuretic Peptide: 643.2 pg/mL — ABNORMAL HIGH (ref 0.0–100.0)

## 2016-07-28 MED ORDER — ATORVASTATIN CALCIUM 40 MG PO TABS
40.0000 mg | ORAL_TABLET | Freq: Every day | ORAL | Status: DC
  Administered 2016-07-28 – 2016-08-02 (×6): 40 mg via ORAL
  Filled 2016-07-28 (×6): qty 1

## 2016-07-28 MED ORDER — TIZANIDINE HCL 2 MG PO TABS
4.0000 mg | ORAL_TABLET | Freq: Once | ORAL | Status: AC
  Administered 2016-07-28: 4 mg via ORAL
  Filled 2016-07-28: qty 2

## 2016-07-28 MED ORDER — ISOSORBIDE MONONITRATE ER 30 MG PO TB24
30.0000 mg | ORAL_TABLET | Freq: Every day | ORAL | Status: DC
Start: 2016-07-28 — End: 2016-08-03
  Administered 2016-07-28 – 2016-08-03 (×7): 30 mg via ORAL
  Filled 2016-07-28 (×8): qty 1

## 2016-07-28 MED ORDER — ALBUTEROL SULFATE (2.5 MG/3ML) 0.083% IN NEBU: 3.0000 mL | INHALATION_SOLUTION | Freq: Every day | RESPIRATORY_TRACT | Status: DC | PRN

## 2016-07-28 MED ORDER — NITROGLYCERIN 0.4 MG SL SUBL: 0.4000 mg | SUBLINGUAL_TABLET | SUBLINGUAL | Status: DC | PRN

## 2016-07-28 MED ORDER — ONDANSETRON HCL 4 MG/2ML IJ SOLN
4.0000 mg | Freq: Four times a day (QID) | INTRAMUSCULAR | Status: DC | PRN
  Administered 2016-07-30: 4 mg via INTRAVENOUS
  Filled 2016-07-28: qty 2

## 2016-07-28 MED ORDER — HEPARIN SODIUM (PORCINE) 5000 UNIT/ML IJ SOLN
5000.0000 [IU] | Freq: Three times a day (TID) | INTRAMUSCULAR | Status: DC
  Administered 2016-07-28 – 2016-08-03 (×17): 5000 [IU] via SUBCUTANEOUS
  Filled 2016-07-28 (×17): qty 1

## 2016-07-28 MED ORDER — UMECLIDINIUM-VILANTEROL 62.5-25 MCG/INH IN AEPB
1.0000 | INHALATION_SPRAY | Freq: Every day | RESPIRATORY_TRACT | Status: DC
  Administered 2016-07-29 – 2016-08-03 (×5): 1 via RESPIRATORY_TRACT
  Filled 2016-07-28: qty 14

## 2016-07-28 MED ORDER — CLOPIDOGREL BISULFATE 75 MG PO TABS
75.0000 mg | ORAL_TABLET | Freq: Every day | ORAL | Status: DC
  Administered 2016-07-28 – 2016-08-03 (×7): 75 mg via ORAL
  Filled 2016-07-28 (×7): qty 1

## 2016-07-28 MED ORDER — ADULT MULTIVITAMIN W/MINERALS CH
1.0000 | ORAL_TABLET | Freq: Every day | ORAL | Status: DC
  Administered 2016-07-28 – 2016-08-03 (×7): 1 via ORAL
  Filled 2016-07-28 (×7): qty 1

## 2016-07-28 MED ORDER — LOSARTAN POTASSIUM 25 MG PO TABS
12.5000 mg | ORAL_TABLET | Freq: Every day | ORAL | Status: DC
  Administered 2016-07-28 – 2016-08-03 (×7): 12.5 mg via ORAL
  Filled 2016-07-28 (×8): qty 1

## 2016-07-28 MED ORDER — TIZANIDINE HCL 2 MG PO TABS
4.0000 mg | ORAL_TABLET | Freq: Two times a day (BID) | ORAL | Status: DC | PRN
  Administered 2016-07-28 – 2016-08-03 (×9): 4 mg via ORAL
  Filled 2016-07-28 (×11): qty 2

## 2016-07-28 MED ORDER — ASPIRIN EC 81 MG PO TBEC
81.0000 mg | DELAYED_RELEASE_TABLET | Freq: Every day | ORAL | Status: DC
  Administered 2016-07-29 – 2016-08-03 (×6): 81 mg via ORAL
  Filled 2016-07-28 (×7): qty 1

## 2016-07-28 MED ORDER — LEVOTHYROXINE SODIUM 75 MCG PO TABS
75.0000 ug | ORAL_TABLET | Freq: Every day | ORAL | Status: DC
  Administered 2016-07-29 – 2016-08-03 (×6): 75 ug via ORAL
  Filled 2016-07-28 (×6): qty 1

## 2016-07-28 MED ORDER — HYDRALAZINE HCL 50 MG PO TABS
75.0000 mg | ORAL_TABLET | Freq: Three times a day (TID) | ORAL | Status: DC
  Administered 2016-07-28 – 2016-08-03 (×18): 75 mg via ORAL
  Filled 2016-07-28 (×17): qty 1

## 2016-07-28 MED ORDER — INSULIN ASPART 100 UNIT/ML ~~LOC~~ SOLN
0.0000 [IU] | Freq: Three times a day (TID) | SUBCUTANEOUS | Status: DC
  Administered 2016-07-28: 2 [IU] via SUBCUTANEOUS
  Administered 2016-07-29: 3 [IU] via SUBCUTANEOUS
  Administered 2016-07-29: 2 [IU] via SUBCUTANEOUS
  Administered 2016-07-30 (×2): 3 [IU] via SUBCUTANEOUS
  Administered 2016-07-30 – 2016-07-31 (×2): 2 [IU] via SUBCUTANEOUS
  Administered 2016-07-31: 3 [IU] via SUBCUTANEOUS
  Administered 2016-07-31: 2 [IU] via SUBCUTANEOUS
  Administered 2016-08-01: 5 [IU] via SUBCUTANEOUS
  Administered 2016-08-01: 3 [IU] via SUBCUTANEOUS
  Administered 2016-08-02: 5 [IU] via SUBCUTANEOUS
  Administered 2016-08-02: 3 [IU] via SUBCUTANEOUS
  Administered 2016-08-03: 5 [IU] via SUBCUTANEOUS

## 2016-07-28 MED ORDER — AMLODIPINE BESYLATE 10 MG PO TABS
10.0000 mg | ORAL_TABLET | Freq: Every day | ORAL | Status: DC
  Administered 2016-07-28 – 2016-08-03 (×7): 10 mg via ORAL
  Filled 2016-07-28 (×7): qty 1

## 2016-07-28 MED ORDER — FUROSEMIDE 10 MG/ML IJ SOLN
60.0000 mg | Freq: Two times a day (BID) | INTRAMUSCULAR | Status: DC
  Administered 2016-07-28 – 2016-07-29 (×2): 60 mg via INTRAVENOUS
  Filled 2016-07-28 (×2): qty 6

## 2016-07-28 MED ORDER — ACETAMINOPHEN 325 MG PO TABS
650.0000 mg | ORAL_TABLET | ORAL | Status: DC | PRN
  Administered 2016-07-28: 650 mg via ORAL
  Filled 2016-07-28 (×2): qty 2

## 2016-07-28 MED ORDER — CITALOPRAM HYDROBROMIDE 20 MG PO TABS
40.0000 mg | ORAL_TABLET | Freq: Every day | ORAL | Status: DC
  Administered 2016-07-28 – 2016-08-03 (×7): 40 mg via ORAL
  Filled 2016-07-28 (×7): qty 2

## 2016-07-28 NOTE — Progress Notes (Signed)
PA Bhagat notified of pt's troponin 0.03. He said to continue to monitor; no new orders received. Will continue to closely monitor pt. Delia Heady RN

## 2016-07-28 NOTE — Consult Note (Signed)
[facility] Nurse wound consult note Reason for Consult: Consult requested for left leg Wound type: Left calf has old dried scabs which remove easily, revealing a partial thickness wound 2X2X.1cm, pink and moist Drainage (amount, consistency, odor) No odor or drainage Dressing procedure/placement/frequency: Foam dressing to protect and promote healing. No family member at the bedside to discuss plan of care. Please re-consult if further assistance is needed.  Thank-you,  Julien Girt MSN, [place], [place], [place], [place]

## 2016-07-28 NOTE — Progress Notes (Signed)
No orders yet; MD paged and awaiting on orders for pt. Delia Heady RN

## 2016-07-28 NOTE — Progress Notes (Signed)
Pt c/o of bilateral leg pain rating 9/10. Pt states PRN tylenol was not effective. MD notified. New orders placed. Will continue to monitor. [name] [name]

## 2016-07-28 NOTE — Progress Notes (Signed)
Pt arrived to the unit via carelink as a tx from [facility] ICU. Pt on face shield mask 40% per carelink as pt desat on nasal cannula. Pt A&O x4; MAE x4; pt oriented to the unit and room; foley intact and unclamped; no pressure ulcer noted but pink foam dsg applied for protection. BLE color cyanotic and pt has dsg to LLE which per report pt had ulcer to that leg; old bruises/ecchymosis noted to back and arms. Pt placed on telemetry; VSS; fall/safety precaution/prevention education complete with pt. Pt voices understanding and denies any questions. Pt in bed with call light within reach. Will continue to closely monitor pt. Delia Heady RN

## 2016-07-28 NOTE — H&P (Signed)
Cardiology Admission History and Physical:   Patient ID: [name] [name]; MRN: [id number]; DOB: [birth year]   Admission date: 07/28/2016  Primary Care Provider: Glenda Chroman, MD Cardiology: Dr. Domenic Polite HF Cardiology: Dr. Aundra Dubin PV: Dr. Gwenlyn Found   Chief Complaint:  Chest pain and shortness of breath   Patient Profile:   [name] [name] is a 71 y.o. female with a history of chronic diastolic CHF, CAD s/p CABG, chronic right pleural effusion, CKD stage III, CVA, HTN, carotid artery disease s/p bilateral CEAs, DM, COPD on 24/7 oxygen, bradycardia on coreg and PAD transferred from [facility] for chest pain.   PMH: 1. Chronic diastolic CHF: Multiple admissions.  - Uses home oxygen.  - RHC (9/17): mean 15, PA 73/26 mean 42, mean PCWP 25, CI 2.84, PVR 3.0 WU => pulmonary venous hypertension. - Last Echo (6/18) with EF 55-60%, grade II diastolic dysfunction,mild systolic flattening of ventricular septum, mild dilated RV, moderately dilated RA, PASP 84 mmHg.   2. H/o CVA 3. Recurrent right pleural effusion: S/p Pleurx catheter placement.  4. Depression 5. HTN 6. Carotid stenosis: Bilateral CEAs 7. Type II diabetes.  8. CAD: NSTEMI 11/12, had CABG with LIMA-LAD, SVG-PLV, SVG-OM, SVG-D.   - LHC (9/15) with DES to SVG-D.  Other grafts patent.  9. PAD: Left femoral-popliteal bypass 9/15.  - 10/17 right SFA atherectomy and right tibioperoneal trunk atherectomy.  - peripheral arterial dopplers (2/18) with right ABI 0.7, left ABI 0.9.  10. CKD stage III. Lisinopril stopped with increased creatinine.  11. Bradycardia: Stopped Coreg.  12. Hyperthyroidism: Diagnosed in 9/17. Subacute thyroiditis.  Followed by Dr. Dorris Fetch.  13. Sleep study 12/17 without significant OSA.    History of Present Illness:   [name] [name] recently treated with vancomycin and zosyn for L Lower extremity cellulitis and hematoma. Went to nursing home for rehab where treated with Augmentin. She had intermittent chest pain while  at facility however she had worse episode of sharp chest pain radiating all across chest ? Arm  taken to ER at [facility] for further evaluation. Given ASA and SL nitro with minimal reliefs. (personally spoke with Dr. Woody Seller). No blood clot on leg.   In ER noted hypoxic with oxygen saturation of 81% and hypertensive to 180/80s. Treated with BiPAP and lasix. BNP ~7000. Troponin 0.09. EKG showed sinus rhythm at rate of 58 bpm. No acute changes - personally reviewed. Scr 1.25. K 5.0. CXR showed cardiomegaly with pulmonary vascular congestion and mild perihilar edema.   Here patient states that she was not getting some of her home medication at facility. Unable to identify. She was doing well prior to going to rehab. On BiPAP. Breathing heavily. No chest pain.   Past Medical History:  Diagnosis Date  . Arthritis   . Carotid artery disease (HCC)    a. Bilateral CEA; 50-60% bilateral ICA stenosis, 11/12  . Cellulitis    a. Recurrent, bilateral legs  . CHF (congestive heart failure) ([place])   . Chronic back pain   . Chronic diastolic heart failure ([place])   . Coronary atherosclerosis of native coronary artery    a. 11/2010 NSTEMI/CABG x 4: L-LAD; S-PL; S-OM; S-DX, by PVT.  [name] Critical lower limb ischemia   . Depression   . Essential hypertension, benign   . Herniated disc   . History of blood transfusion   . Ischemic cardiomyopathy    a. 11/2010 TEE: EF 40-45%.  . Migraine   . Mixed hyperlipidemia   . NSTEMI (  non-ST elevated myocardial infarction) ([place]) 11/2010  . On home oxygen therapy   . PAD (peripheral artery disease) ([place])   . Pneumonia 2000's X 2  . Stroke ([place]) 08/2009  . Suicide attempt ([place]) 08/2002  . TIA (transient ischemic attack)   . TMJ syndrome   . Type 2 diabetes mellitus ([place])   . Venous insufficiency     Past Surgical History:  Procedure Laterality Date  . ABDOMINAL HYSTERECTOMY    . APPENDECTOMY    . CARDIAC CATHETERIZATION  11/2010  . CARDIAC CATHETERIZATION N/A 09/16/2015    Procedure: Right Heart Cath;  Surgeon: Larey Dresser, MD;  Location: [place] CV [facility];  Service: Cardiovascular;  Laterality: N/A;  . CAROTID ENDARTERECTOMY Bilateral 2011   Bilateral - Dr. Kellie Simmering  . CHOLECYSTECTOMY    . COLONOSCOPY W/ BIOPSIES AND POLYPECTOMY    . CORONARY ANGIOPLASTY WITH STENT PLACEMENT  03/2013   "1"  . CORONARY ARTERY BYPASS GRAFT  11/24/2010   Procedure: CORONARY ARTERY BYPASS GRAFTING (CABG);  Surgeon: Tharon Aquas Adelene Idler, MD;  Location: [place];  Service: Open Heart Surgery;  Laterality: N/A;  Coronary Artery Bypass Graft times four on pump utilizing left internal mammary artery and bilateral greater saphenous veins harvested endoscopically, transesophageal echocardiogram   . DEBRIDEMENT TOE Left    "nonhealing wound; 3rd digit  . DILATION AND CURETTAGE OF UTERUS    . ENDARTERECTOMY POPLITEAL Left 10/07/2013   Procedure: LEFT POPLITEAL ENDARTERECTOMY ;  Surgeon: Angelia Mould, MD;  Location: [place];  Service: Vascular;  Laterality: Left;  . FEMORAL-POPLITEAL BYPASS GRAFT Left 10/07/2013   Procedure: LEFT FEMORAL-POPLITEAL ARTERY BYPASS GRAFT;  Surgeon: Angelia Mould, MD;  Location: [place];  Service: Vascular;  Laterality: Left;  . FRACTURE SURGERY    . INTRAOPERATIVE ARTERIOGRAM Left 10/07/2013   Procedure: INTRA OPERATIVE ARTERIOGRAM LEFT LEG;  Surgeon: Angelia Mould, MD;  Location: [place];  Service: Vascular;  Laterality: Left;  . IR GENERIC HISTORICAL  09/21/2015   IR REMOVAL OF PLURAL CATH W/CUFF 09/21/2015 Marybelle Killings, MD [facility]-INTERV RAD  . LEFT AND RIGHT HEART CATHETERIZATION WITH CORONARY/GRAFT ANGIOGRAM N/A 03/27/2013   Procedure: LEFT AND RIGHT HEART CATHETERIZATION WITH Beatrix Fetters;  Surgeon: Burnell Blanks, MD;  Location[facility] CATH LAB;  Service: Cardiovascular;  Laterality: N/A;  . LOWER EXTREMITY ANGIOGRAM  09/08/2012; 10/06/2013   "found 100% blockage; unsuccessful attempt at crossing a chronic total occlusion of the left  SFA in the setting of critical limb ischemia  . LOWER EXTREMITY ANGIOGRAM Bilateral 09/08/2013   Procedure: LOWER EXTREMITY ANGIOGRAM;  Surgeon: [name], MD;  Location: [facility] CATH LAB;  Service: Cardiovascular;  Laterality: Bilateral;  . PERIPHERAL VASCULAR CATHETERIZATION  11/04/2015   Procedure: Peripheral Vascular Atherectomy;  Surgeon: [name], MD;  Location: [place] CV LAB;  Service: Cardiovascular;;  RT. SFA  . PERIPHERAL VASCULAR CATHETERIZATION Bilateral 11/04/2015   Procedure: Lower Extremity Intervention;  Surgeon: [name], MD;  Location: [place] CV LAB;  Service: Cardiovascular;  Laterality: Bilateral;  . PERIPHERAL VASCULAR CATHETERIZATION  11/04/2015   Procedure: Peripheral Vascular Balloon Angioplasty;  Surgeon: [name], MD;  Location: [place] CV LAB;  Service: Cardiovascular;;  TP Trunk  . TOE SURGERY Left    "put pin in 2nd toe"  . TUBAL LIGATION    . WRIST FRACTURE SURGERY Left    "grafted bone from hip to wrist"     Medications Prior to Admission: Prior to Admission medications  Medication Sig Start Date End Date Taking? Authorizing Provider  acetaminophen (TYLENOL) 500 MG tablet Take 500-1,000 mg by mouth every 6 (six) hours as needed for mild pain or moderate pain.     [provider]  albuterol (PROVENTIL HFA;VENTOLIN HFA) 108 (90 BASE) MCG/ACT inhaler Inhale 2 puffs into the lungs daily as needed for wheezing or shortness of breath.     [provider]  amLODipine (NORVASC) 10 MG tablet TAKE ONE TABLET BY MOUTH ONCE DAILY 08/16/15   Satira Sark, MD  aspirin 81 MG EC tablet Take 81 mg by mouth daily. 03/29/13   Lyda Jester M, PA-C  atorvastatin (LIPITOR) 40 MG tablet Take 40 mg by mouth daily at 6 PM. 03/29/13   Consuelo Pandy, PA-C  citalopram (CELEXA) 40 MG tablet Take 40 mg by mouth every morning.  05/20/15   [provider]  clopidogrel (PLAVIX) 75 MG tablet TAKE ONE TABLET BY  MOUTH ONCE DAILY WITH  BREAKFAST 07/03/16   Satira Sark, MD  clotrimazole-betamethasone (LOTRISONE) cream Apply 1 application topically 2 (two) times daily as needed. 01/24/16   [provider]  Docusate Calcium (STOOL SOFTENER PO) Take 200 mg by mouth at bedtime as needed (for constipation).     [provider]  gabapentin (NEURONTIN) 300 MG capsule Take 300-600 mg by mouth See admin instructions. 300 mg in the morning and 600 mg prior to bed(time) 08/02/14   [provider]  hydrALAZINE (APRESOLINE) 50 MG tablet TAKE ONE & ONE-HALF TABLETS BY MOUTH THREE TIMES DAILY 06/12/16   Bensimhon, Shaune Pascal, MD  HYDROcodone-acetaminophen (NORCO/VICODIN) 5-325 MG tablet Take 1 tablet by mouth 3 (three) times daily as needed. 01/24/16   [provider]  insulin aspart (NOVOLOG) 100 UNIT/ML injection Inject 3 Units into the skin 3 (three) times daily with meals. Patient taking differently: Inject 3-7 Units into the skin 3 (three) times daily after meals. Per sliding scale 10/19/14   Gherghe, Vella Redhead, MD  insulin detemir (LEVEMIR) 100 UNIT/ML injection Inject 7 Units into the skin at bedtime.    [provider]  isosorbide mononitrate (IMDUR) 30 MG 24 hr tablet TAKE ONE TABLET BY MOUTH ONCE DAILY 05/08/16   Satira Sark, MD  levothyroxine (SYNTHROID, LEVOTHROID) 75 MCG tablet Take 1 tablet (75 mcg total) by mouth daily before breakfast. 06/23/16   Nida, Marella Chimes, MD  losartan (COZAAR) 25 MG tablet Take 0.5 tablets (12.5 mg total) by mouth daily. 06/22/16 09/20/16  Bensimhon, Shaune Pascal, MD  Multiple Vitamin (MULTIVITAMIN WITH MINERALS) TABS tablet Take 1 tablet by mouth daily.    [provider]  nitroGLYCERIN (NITROSTAT) 0.4 MG SL tablet Place 1 tablet (0.4 mg total) under the tongue every 5 (five) minutes as needed for chest pain. Up to 3 doses. If no relief after 3rd dose, proceed to the ED for an evaluation 09/30/14   Satira Sark, MD  nystatin  (MYCOSTATIN/NYSTOP) powder Apply 1 application topically 2 (two) times daily as needed. 01/24/16   [provider]  potassium chloride SA (K-DUR,KLOR-CON) 20 MEQ tablet Take 1 tablet (20 mEq total) by mouth 2 (two) times daily. 03/16/16   Larey Dresser, MD  spironolactone (ALDACTONE) 25 MG tablet TAKE ONE-HALF TABLET BY MOUTH ONCE DAILY 04/28/16   Bensimhon, Shaune Pascal, MD  tiZANidine (ZANAFLEX) 4 MG tablet Take 4 mg by mouth 2 (two) times daily as needed for muscle spasms.    [provider]  torsemide (DEMADEX) 20 MG  tablet Take 60 mg(3 tabs) in the AM and 40 mg (2 tabs) in the PM May take an additional tablet if weight increases 05/16/16   Satira Sark, MD  umeclidinium-vilanterol (ANORO ELLIPTA) 62.5-25 MCG/INH AEPB Inhale 1 puff into the lungs daily.    [provider]     Allergies:    Allergies  Allergen Reactions  . Other Shortness Of Breath, Rash and Other (See Comments)    CANNOT EAT ANY BERRIES!!  . Strawberry Extract Hives, Shortness Of Breath and Rash  . Sulfa Antibiotics Swelling and Other (See Comments)    Bodily Swelling  . Coffee Bean Extract [Coffea Arabica] Itching and Rash  . Tape Other (See Comments)    Tears skin.  Please use "paper" tape only.    Social History:   Social History   Social History  . Marital status: Widowed    Spouse name: N/A  . Number of children: N/A  . Years of education: N/A   Occupational History  . Not on file.   Social History Main Topics  . Smoking status: Former Smoker    Packs/day: 3.00    Years: 50.00    Types: Cigarettes    Start date: 01/24/1956    Quit date: 10/09/2009  . Smokeless tobacco: Never Used  . Alcohol use 0.0 oz/week     Comment: 10/06/2013 "might have a drink q 2-3 months"  . Drug use: Yes    Types: Marijuana     Comment: "smoked pot in my teens"  . Sexual activity: Not Currently   Other Topics Concern  . Not on file   Social History Narrative   Lives in [place] by herself.  She  does not work.    Family History:   The patient's family history includes Cancer in her brother and sister; Coronary artery disease in her father; Diabetes in her daughter, mother, and son; Diabetes type II in her mother; Heart attack in her father; Heart disease in her father and mother; Heart failure in her sister; Hyperlipidemia in her daughter, mother, and son; Hypertension in her mother.    ROS:  Please see the history of present illness.  All other ROS reviewed and negative.     Physical Exam/Data:   Vitals:   07/28/16 1140  BP: (!) 162/56  Pulse: (!) 54  Resp: 15  Temp: (!) 96.7 F (35.9 C)  TempSrc: Axillary  SpO2: 94%   No intake or output data in the 24 hours ending 07/28/16 1255 There were no vitals filed for this visit. There is no height or weight on file to calculate BMI.  General:  Obese female in mild distress on BiPAP HEENT: normal Lymph: no adenopathy Neck: +JVD Endocrine:  No thryomegaly Vascular: No carotid bruits; FA pulses 2+ bilaterally without bruits  Cardiac:  normal S1, S2; RRR; no murmur Lungs:  Diminished breath sound with rales.  Abd: soft, nontender, no hepatomegaly  Ext: L leg has dressing. Mild vascular skin changes Musculoskeletal:  No deformities, BUE and BLE strength normal and equal Skin: warm and dry  Neuro:  CNs 2-12 intact, no focal abnormalities noted Psych:  Normal affect   Relevant CV Studies: Echo 06/2016 - Left ventricle: The cavity size was normal. Wall thickness was   increased in a pattern of mild LVH. Systolic function was normal.   The estimated ejection fraction was in the range of 55% to 60%.   Wall motion was normal; there were no regional wall motion  abnormalities. Features are consistent with a pseudonormal left   ventricular filling pattern, with concomitant abnormal relaxation   and increased filling pressure (grade 2 diastolic dysfunction). - Ventricular septum: The contour showed mild systolic flattening. -  Left atrium: The atrium was moderately dilated. - Right ventricle: The cavity size was mildly dilated. - Right atrium: The atrium was moderately dilated. - Tricuspid valve: There was moderate regurgitation. - Pulmonary arteries: Systolic pressure was severely increased. PA   peak pressure: 84 mm Hg (S).  Laboratory Data:  Pending labs here  Radiology/Studies:  No results found.  Assessment and Plan:   1. Acute on chronic diastolic CHF - BNP ~6770. CXR with vascular congestion. Recent echo 06/22/16 with EF 55-60%, grade II diastolic dysfunction,mild systolic flattening of ventricular septum, mild dilated RV, moderately dilated RA, PASP 84 mmHg.   - Start IV lasix 46m BID. Strict I & O and daily weight. Follow renal function closely.  - Resume home amlodipine 133mqd, hydralazine 7584mID, Imdur 102m74m, Losartan 12.5mg 33m hold spironolactone 12.5mg q40m - diuresis first and then likely RHC on Monday. CHF team to follow tomorrow.   2. Pulmonary HTN - Right heart cath in Sept. 2017 with PA 73/26 mean 42. PVR 3.0 WU. Recent echo showed PASP of 84 mm Hg.   3. Acute on chronic hypoxic respiratory failure - Presented with oxygen saturation of 84% on supplemental oxygen. Now 95% on BiPAP.   4. Elevated troponin with hx of CAD s/p CABG in 2012 & DES to SVG/dig in 09/2013 - Troponin of 0.09. Continue cycle troponin. EKG without acute changes. Likely demand in setting of acute CHF and hypertensive urgency  5. Hypertensive urgency - BP of 162/56. As above.   6. PAD s/p Left femoral-popliteal bypass 9/15 &  right SFA atherectomy and right tibioperoneal trunk atherectomy 10/17 - Peripheral arterial dopplers (2/18) with right ABI 0.7, left ABI 0.9.   7. Recent LE cellulitis ( L ) - treated with IV abx and then Augmentin prior to presentation  8. CKD stage III - Scr stable at 1.25. Follow with diuresis.   9. DM - SSI  10. HLD - 11/15/2015: Cholesterol 78; HDL 22; LDL Cholesterol 36;  Triglycerides 102; VLDL 20  - Continue statin     Severity of Illness: The appropriate patient status for this patient is INPATIENT. Inpatient status is judged to be reasonable and necessary in order to provide the required intensity of service to ensure the patient's safety. The patient's presenting symptoms, physical exam findings, and initial radiographic and laboratory data in the context of their chronic comorbidities is felt to place them at high risk for further clinical deterioration. Furthermore, it is not anticipated that the patient will be medically stable for discharge from the hospital within 2 midnights of admission. The following factors support the patient status of inpatient.   " The patient's presenting symptoms include SOB and chest pain . " The worrisome physical exam findings include. LE wrap " The initial radiographic and laboratory data are worrisome because of . Acute CHF " The chronic co-morbidities include obesity and not active   * I certify that at the point of admission it is my clinical judgment that the patient will require inpatient hospital care spanning beyond 2 midnights from the point of admission due to high intensity of service, high risk for further deterioration and high frequency of surveillance required.*    SJarrett Soho7/20/2018 12:55 PM

## 2016-07-29 DIAGNOSIS — I5033 Acute on chronic diastolic (congestive) heart failure: Secondary | ICD-10-CM

## 2016-07-29 LAB — BASIC METABOLIC PANEL
Anion gap: 9 (ref 5–15)
BUN: 30 mg/dL — ABNORMAL HIGH (ref 6–20)
CO2: 25 mmol/L (ref 22–32)
Calcium: 8.8 mg/dL — ABNORMAL LOW (ref 8.9–10.3)
Chloride: 105 mmol/L (ref 101–111)
Creatinine, Ser: 1.4 mg/dL — ABNORMAL HIGH (ref 0.44–1.00)
GFR calc non Af Amer: 37 mL/min — ABNORMAL LOW (ref >60)
GFR, EST AFRICAN AMERICAN: 43 mL/min — AB (ref >60)
Glucose, Bld: 131 mg/dL — ABNORMAL HIGH (ref 65–99)
POTASSIUM: 4.8 mmol/L (ref 3.5–5.1)
SODIUM: 139 mmol/L (ref 135–145)

## 2016-07-29 LAB — CBC
HEMATOCRIT: 33.7 % — AB (ref 36.0–46.0)
HEMOGLOBIN: 10 g/dL — AB (ref 12.0–15.0)
MCH: 25.7 pg — ABNORMAL LOW (ref 26.0–34.0)
MCHC: 29.7 g/dL — ABNORMAL LOW (ref 30.0–36.0)
MCV: 86.6 fL (ref 78.0–100.0)
Platelets: 155 10*3/uL (ref 150–400)
RBC: 3.89 MIL/uL (ref 3.87–5.11)
RDW: 16.2 % — ABNORMAL HIGH (ref 11.5–15.5)
WBC: 6.9 10*3/uL (ref 4.0–10.5)

## 2016-07-29 LAB — GLUCOSE, CAPILLARY
GLUCOSE-CAPILLARY: 151 mg/dL — AB (ref 65–99)
GLUCOSE-CAPILLARY: 158 mg/dL — AB (ref 65–99)
Glucose-Capillary: 119 mg/dL — ABNORMAL HIGH (ref 65–99)
Glucose-Capillary: 161 mg/dL — ABNORMAL HIGH (ref 65–99)

## 2016-07-29 LAB — TROPONIN I: Troponin I: 0.05 ng/mL (ref <0.03)

## 2016-07-29 MED ORDER — MORPHINE SULFATE (PF) 2 MG/ML IV SOLN
2.0000 mg | INTRAVENOUS | Status: DC | PRN
  Administered 2016-07-29 – 2016-08-03 (×21): 2 mg via INTRAVENOUS
  Filled 2016-07-29 (×22): qty 1

## 2016-07-29 MED ORDER — FUROSEMIDE 10 MG/ML IJ SOLN
80.0000 mg | Freq: Two times a day (BID) | INTRAMUSCULAR | Status: DC
  Administered 2016-07-29 – 2016-08-02 (×8): 80 mg via INTRAVENOUS
  Filled 2016-07-29 (×8): qty 8

## 2016-07-29 MED ORDER — MORPHINE SULFATE (PF) 2 MG/ML IV SOLN
2.0000 mg | Freq: Once | INTRAVENOUS | Status: AC
  Administered 2016-07-29: 2 mg via INTRAVENOUS
  Filled 2016-07-29: qty 1

## 2016-07-29 NOTE — Clinical Social Work Note (Signed)
Clinical Social Work Assessment  Patient Details  Name: [name] [name] MRN: [id number] Date of Birth: 07-06-1945  Date of referral:  07/29/16               Reason for consult:  Facility Placement, Discharge Planning                Permission sought to share information with:  Facility Art therapist granted to share information::  Yes, Verbal Permission Granted  Name::        Agency::  SNF's  Relationship::     Contact Information:     Housing/Transportation Living arrangements for the past 2 months:  [place] of Information:  Patient, Medical Team Patient Interpreter Needed:  None Criminal Activity/Legal Involvement Pertinent to Current Situation/Hospitalization:  No - Comment as needed Significant Relationships:  Adult Children, Other Family Members Lives with:  Facility Resident Do you feel safe going back to the place where you live?  No Need for family participation in patient care:  Yes (Comment)  Care giving concerns:  Patient was admitted from [facility].   Social Worker assessment / plan:  CSW met with patient. No supports at bedside. CSW introduced role and explained that discharge planning would be discussed. Patient confirmed that she was at [facility] for a short period of time but does not want to return. First preference is [facility]. PT needs to evaluated prior to referral being faxed out. No further concerns. CSW encouraged patient to contact CSW as needed. CSW will continue to follow patient for support and facilitate discharge to SNF once medically stable.  Employment status:  Retired Forensic scientist:  Medicare PT Recommendations:  Not assessed at this time [place] / Referral to community resources:  [place]  Patient/Family's Response to care:  Patient agreeable to SNF placement but does not want to return to [facility]. Patient's family  supportive and involved in patient's care. Patient appreciated social work intervention.  Patient/Family's Understanding of and Emotional Response to Diagnosis, Current Treatment, and Prognosis:  Patient has a good understanding of the reason for admission and her need for continued rehab prior to returning home. Patient appears pleased with hospital care.  Emotional Assessment Appearance:  Appears stated age Attitude/Demeanor/Rapport:  Other (Pleasant) Affect (typically observed):  Accepting, Appropriate, Calm, Pleasant Orientation:  Oriented to Self, Oriented to Place, Oriented to  Time, Oriented to Situation Alcohol / Substance use:  Tobacco Use Psych involvement (Current and /or in the community):  No (Comment)  Discharge Needs  Concerns to be addressed:  Care Coordination Readmission within the last 30 days:  No Current discharge risk:  Dependent with Mobility Barriers to Discharge:  Continued Medical Work up   Candie Chroman, LCSW 07/29/2016, 10:54 AM

## 2016-07-29 NOTE — Progress Notes (Signed)
Patient ID: [name] [name], female   DOB: 1945-07-22, 71 y.o.   MRN: [id number]     Advanced Heart Failure Rounding Note  Primary Cardiologist: Aundra Dubin  Subjective:    BP better-controlled with restarting her home meds.  No chest pain (was having pleuritic-type chest pain at admission).  She diuresed some overnight, breathing better but still short of breath.   Complains of pain in her legs from hips down bilaterally.  Objective:   Weight Range: 194 lb 7.1 oz (88.2 kg) Body mass index is 31.38 kg/m.   Vital Signs:   Temp:  [96.7 F (35.9 C)-98.5 F (36.9 C)] 98.2 F (36.8 C) (07/21 0800) Pulse Rate:  [44-59] 59 (07/21 0800) Resp:  [13-16] 14 (07/21 0800) BP: (123-162)/(44-103) 123/103 (07/21 0800) SpO2:  [90 %-97 %] 90 % (07/21 0901) FiO2 (%):  [36 %] 36 % (07/21 0901) Weight:  [194 lb 7.1 oz (88.2 kg)] 194 lb 7.1 oz (88.2 kg) (07/21 0400) Last BM Date: 07/27/16  Weight change: Filed Weights   07/29/16 0400  Weight: 194 lb 7.1 oz (88.2 kg)    Intake/Output:   Intake/Output Summary (Last 24 hours) at 07/29/16 1129 Last data filed at 07/29/16 0133  Gross per 24 hour  Intake              955 ml  Output             3150 ml  Net            -2195 ml      Physical Exam    General:  Obese, NAD.  HEENT: Normal Neck: Thick. JVP 14 cm. No lymphadenopathy or thyromegaly appreciated. Cor: PMI nondisplaced. Regular rate & rhythm. No rubs, gallops or murmurs. Difficult to palpate pedal pulses.  Lungs: Clear to auscultation bilaterally.  Abdomen: Soft, nontender, nondistended. No hepatosplenomegaly. No bruits or masses. Good bowel sounds. Extremities: No cyanosis, clubbing.  Trace ankle edema.  Chronic venous stasis changes lower legs.  Neuro: + Tremor at rest.  Alert & orientedx3, cranial nerves grossly intact. moves all 4 extremities w/o difficulty. Affect pleasant   Telemetry   Personally reviewed, sinus brady in 62s.   Labs    CBC  Recent Labs  07/28/16 1332  07/28/16 1839 07/29/16 0302  WBC 5.2 6.2 6.9  NEUTROABS 4.1  --   --   HGB 10.8* 10.7* 10.0*  HCT 35.9* 35.1* 33.7*  MCV 87.8 87.3 86.6  PLT 157 166 007   Basic Metabolic Panel  Recent Labs  07/28/16 1332 07/28/16 1839 07/29/16 0302  NA 139  --  139  K 5.0  --  4.8  CL 108  --  105  CO2 23  --  25  GLUCOSE 155*  --  131*  BUN 25*  --  30*  CREATININE 1.26* 1.27* 1.40*  CALCIUM 8.7*  --  8.8*   Liver Function Tests  Recent Labs  07/28/16 1332  AST 20  ALT 15  ALKPHOS 63  BILITOT 0.7  PROT 7.0  ALBUMIN 3.5   No results for input(s): LIPASE, AMYLASE in the last 72 hours. Cardiac Enzymes  Recent Labs  07/28/16 1332 07/28/16 1839 07/29/16 0034  TROPONINI 0.03* 0.03* 0.05*    BNP: BNP (last 3 results)  Recent Labs  11/25/15 1216 03/16/16 1033 07/28/16 1332  BNP 747.2* 542.5* 643.2*    ProBNP (last 3 results) No results for input(s): PROBNP in the last 8760 hours.   D-Dimer No results for input(s):  DDIMER in the last 72 hours. Hemoglobin A1C No results for input(s): HGBA1C in the last 72 hours. Fasting Lipid Panel No results for input(s): CHOL, HDL, LDLCALC, TRIG, CHOLHDL, LDLDIRECT in the last 72 hours. Thyroid Function Tests  Recent Labs  07/28/16 1839  TSH 1.323    Other results:   Imaging     No results found.   Medications:     Scheduled Medications: . amLODipine  10 mg Oral Daily  . aspirin EC  81 mg Oral Daily  . atorvastatin  40 mg Oral q1800  . citalopram  40 mg Oral Daily  . clopidogrel  75 mg Oral Daily  . furosemide  80 mg Intravenous BID  . heparin  5,000 Units Subcutaneous Q8H  . hydrALAZINE  75 mg Oral Q8H  . insulin aspart  0-15 Units Subcutaneous TID WC  . isosorbide mononitrate  30 mg Oral Daily  . levothyroxine  75 mcg Oral QAC breakfast  . losartan  12.5 mg Oral Daily  . multivitamin with minerals  1 tablet Oral Daily  . umeclidinium-vilanterol  1 puff Inhalation Daily     Infusions:   PRN  Medications:  acetaminophen, albuterol, morphine injection, nitroGLYCERIN, ondansetron (ZOFRAN) IV, tiZANidine    Patient Profile   71 yo with history of chronic diastolic CHF, pulmonary venous hypertension, CAD s/p CABG, h/o chronic right pleural effusion, CKD stage 3, PAD was admitted with acute on chronic diastolic CHF.   Assessment/Plan   1. Acute on chronic diastolic CHF: Echo (8/88) with EF 55-60%, d-shaped septum, mildly dilated RV, moderate TR, PASP 84 mmHg.  She was only on half her home torsemide dose at a rehab facility and also was not on some of her antihypertensives.  Admitted with hypertensive urgency + CHF.  She diuresed overnight reasonably well.  She still has significant volume overload on exam.  - Increase Lasix to 80 mg IV bid.  - Would do RHC after she is fully diuresed given severe pulmonary hypertension on echo.  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  - She is on home oxygen.  2. CKD: Stage 3.  Creatinine up a bit today but still below what had appeared to be her baseline (1.6 range).  Watch closely with diuresis.  3. HTN: BP controlled now that she is back on her home antihypertensives.  4. PAD: Difficult to palpate pulses, has pain in legs.  Extensive history, she had her last peripheral intervention in 10/17.  - Will get peripheral arterial doppler evaluation.  5. CAD: s/p CABG in 11/12, DES to SVG-D in 10/17.  She had chest pain at admission in setting of CHF + hypertension.  No further chest pain. Mild TnI elevation with no trend.  Suspect demand ischemia due to HTN and volume overload.  - Continue ASA, Plavix, statin.  6. Recent LLE cellulitis: Has been treated, leg looks better.   Length of Stay: 1  Loralie Champagne, MD  07/29/2016, 11:29 AM  Advanced Heart Failure Team Pager [phone] (M-F; 7a - 4p)  Please contact [facility] for night-coverage after hours (4p -7a ) and weekends on [email or web address]

## 2016-07-29 NOTE — Progress Notes (Signed)
Pt continues to c/o bilateral leg pain. Pt states pain is worse than normal rating 10/10. MD notified. Will continue to monitor. Isac Caddy, RN

## 2016-07-30 ENCOUNTER — Inpatient Hospital Stay (HOSPITAL_COMMUNITY): Payer: Medicare Other

## 2016-07-30 DIAGNOSIS — M79609 Pain in unspecified limb: Secondary | ICD-10-CM

## 2016-07-30 LAB — GLUCOSE, CAPILLARY
GLUCOSE-CAPILLARY: 142 mg/dL — AB (ref 65–99)
GLUCOSE-CAPILLARY: 186 mg/dL — AB (ref 65–99)
GLUCOSE-CAPILLARY: 168 mg/dL — AB (ref 65–99)
Glucose-Capillary: 112 mg/dL — ABNORMAL HIGH (ref 65–99)

## 2016-07-30 LAB — BASIC METABOLIC PANEL
Anion gap: 7 (ref 5–15)
BUN: 37 mg/dL — AB (ref 6–20)
CO2: 28 mmol/L (ref 22–32)
CREATININE: 1.44 mg/dL — AB (ref 0.44–1.00)
Calcium: 8.8 mg/dL — ABNORMAL LOW (ref 8.9–10.3)
Chloride: 102 mmol/L (ref 101–111)
GFR calc Af Amer: 42 mL/min — ABNORMAL LOW (ref >60)
GFR, EST NON AFRICAN AMERICAN: 36 mL/min — AB (ref >60)
Glucose, Bld: 151 mg/dL — ABNORMAL HIGH (ref 65–99)
Potassium: 4.2 mmol/L (ref 3.5–5.1)
SODIUM: 137 mmol/L (ref 135–145)

## 2016-07-30 MED ORDER — HYDROCODONE-ACETAMINOPHEN 5-325 MG PO TABS
1.0000 | ORAL_TABLET | Freq: Four times a day (QID) | ORAL | Status: DC | PRN
  Administered 2016-07-30 – 2016-08-03 (×12): 1 via ORAL
  Filled 2016-07-30 (×12): qty 1

## 2016-07-30 MED ORDER — METOLAZONE 2.5 MG PO TABS
2.5000 mg | ORAL_TABLET | Freq: Once | ORAL | Status: AC
  Administered 2016-07-30: 2.5 mg via ORAL
  Filled 2016-07-30: qty 1

## 2016-07-30 MED ORDER — POTASSIUM CHLORIDE CRYS ER 20 MEQ PO TBCR
40.0000 meq | EXTENDED_RELEASE_TABLET | Freq: Once | ORAL | Status: AC
  Administered 2016-07-30: 40 meq via ORAL
  Filled 2016-07-30: qty 2

## 2016-07-30 NOTE — Progress Notes (Signed)
VASCULAR LAB PRELIMINARY  ARTERIAL  ABI completed:ABIs indicate adequate arterial flow to the bilateral lower extremities.     RIGHT    LEFT    PRESSURE WAVEFORM  PRESSURE WAVEFORM  BRACHIAL 168 T BRACHIAL 169 T  DP   DP    AT 157 B AT 143 B  PT 123 B PT 173 B  PER   PER    GREAT TOE  NA GREAT TOE  NA    RIGHT LEFT  ABI 0.93 1.02     [name] [name], RVT 07/30/2016, 11:28 AM

## 2016-07-30 NOTE — Progress Notes (Signed)
Patient ID: AUBRIA VANECEK, female   DOB: 1945/07/09, 71 y.o.   MRN: [id number] P    Advanced Heart Failure Rounding Note  Primary Cardiologist: Aundra Dubin  Subjective:    She is breathing better today.  No chest pain.  Weight was not done and I/Os do not look accurate from yesterday so not a lot of objective evidence of diuresis.  However, she has a lot of urine in her foley bag today.    Complains of pain in her legs from hips down bilaterally.  Objective:   Weight Range: 194 lb 7.1 oz (88.2 kg) Body mass index is 31.38 kg/m.   Vital Signs:   Temp:  [98.1 F (36.7 C)-98.6 F (37 C)] 98.6 F (37 C) (07/22 0802) Pulse Rate:  [42-57] 49 (07/22 0802) Resp:  [12-23] 19 (07/22 0802) BP: (127-158)/(34-51) 158/44 (07/22 0802) SpO2:  [92 %-97 %] 96 % (07/22 0844) FiO2 (%):  [32 %] 32 % (07/22 0844) Last BM Date: 07/28/16  Weight change: Filed Weights   07/29/16 0400  Weight: 194 lb 7.1 oz (88.2 kg)    Intake/Output:   Intake/Output Summary (Last 24 hours) at 07/30/16 1015 Last data filed at 07/30/16 0900  Gross per 24 hour  Intake               23 ml  Output             2450 ml  Net            -2427 ml      Physical Exam    General:  Obese, NAD.  HEENT: Normal Neck: Thick. JVP elevated, 14 cm. No lymphadenopathy or thyromegaly appreciated. Cor: PMI nondisplaced. Regular rate & rhythm. No rubs, gallops.  2/6 SEM RUSB. Difficult to palpate pedal pulses.  Lungs: Mildly decreased BS at bases bilaterally.  Abdomen: Soft, nontender, nondistended. No hepatosplenomegaly. No bruits or masses. Good bowel sounds. Extremities: No cyanosis, clubbing.  Trace ankle edema.  Chronic venous stasis changes lower legs.  Neuro: + Tremor at rest.  Alert & orientedx3, cranial nerves grossly intact. moves all 4 extremities w/o difficulty. Affect pleasant  Telemetry   Personally reviewed, sinus brady in 40s-50s.   Labs    CBC  Recent Labs  07/28/16 1332 07/28/16 1839 07/29/16 0302    WBC 5.2 6.2 6.9  NEUTROABS 4.1  --   --   HGB 10.8* 10.7* 10.0*  HCT 35.9* 35.1* 33.7*  MCV 87.8 87.3 86.6  PLT 157 166 686   Basic Metabolic Panel  Recent Labs  07/29/16 0302 07/30/16 0146  NA 139 137  K 4.8 4.2  CL 105 102  CO2 25 28  GLUCOSE 131* 151*  BUN 30* 37*  CREATININE 1.40* 1.44*  CALCIUM 8.8* 8.8*   Liver Function Tests  Recent Labs  07/28/16 1332  AST 20  ALT 15  ALKPHOS 63  BILITOT 0.7  PROT 7.0  ALBUMIN 3.5   No results for input(s): LIPASE, AMYLASE in the last 72 hours. Cardiac Enzymes  Recent Labs  07/28/16 1332 07/28/16 1839 07/29/16 0034  TROPONINI 0.03* 0.03* 0.05*    BNP: BNP (last 3 results)  Recent Labs  11/25/15 1216 03/16/16 1033 07/28/16 1332  BNP 747.2* 542.5* 643.2*    ProBNP (last 3 results) No results for input(s): PROBNP in the last 8760 hours.   D-Dimer No results for input(s): DDIMER in the last 72 hours. Hemoglobin A1C No results for input(s): HGBA1C in the last 72 hours. Fasting  Lipid Panel No results for input(s): CHOL, HDL, LDLCALC, TRIG, CHOLHDL, LDLDIRECT in the last 72 hours. Thyroid Function Tests  Recent Labs  07/28/16 1839  TSH 1.323    Other results:   Imaging    No results found.   Medications:     Scheduled Medications: . amLODipine  10 mg Oral Daily  . aspirin EC  81 mg Oral Daily  . atorvastatin  40 mg Oral q1800  . citalopram  40 mg Oral Daily  . clopidogrel  75 mg Oral Daily  . furosemide  80 mg Intravenous BID  . heparin  5,000 Units Subcutaneous Q8H  . hydrALAZINE  75 mg Oral Q8H  . insulin aspart  0-15 Units Subcutaneous TID WC  . isosorbide mononitrate  30 mg Oral Daily  . levothyroxine  75 mcg Oral QAC breakfast  . losartan  12.5 mg Oral Daily  . metolazone  2.5 mg Oral Once  . multivitamin with minerals  1 tablet Oral Daily  . potassium chloride  40 mEq Oral Once  . umeclidinium-vilanterol  1 puff Inhalation Daily    Infusions:   PRN  Medications: acetaminophen, albuterol, morphine injection, nitroGLYCERIN, ondansetron (ZOFRAN) IV, tiZANidine    Patient Profile   71 yo with history of chronic diastolic CHF, pulmonary venous hypertension, CAD s/p CABG, h/o chronic right pleural effusion, CKD stage 3, PAD was admitted with acute on chronic diastolic CHF.   Assessment/Plan   1. Acute on chronic diastolic CHF: Echo (7/62) with EF 55-60%, d-shaped septum, mildly dilated RV, moderate TR, PASP 84 mmHg.  She was only on half her home torsemide dose at a rehab facility and also was not on some of her antihypertensives.  Admitted with hypertensive urgency + CHF.  Breathing better but still volume overloaded on exam today.  Hard to tell how well she has diuresed with no weights and inaccurate I/Os. Mild rise in creatinine to 1.44. - Need strict I/Os and daily weights, discussed with nursing.  - Continue IV Lasix and will give a dose of metolazone today.  - Would do RHC after she is fully diuresed given severe pulmonary hypertension on echo.  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  - She is on home oxygen.  2. CKD: Stage 3.  Creatinine up a bit today at 1.44 but still below what had appeared to be her baseline (1.6 range).  Watch closely with diuresis.  3. HTN: BP controlled now that she is back on her home antihypertensives.  4. PAD: Difficult to palpate pulses, has pain in legs.  Extensive history, she had her last peripheral intervention in 10/17.  - Will get peripheral arterial doppler evaluation.  5. CAD: s/p CABG in 11/12, DES to SVG-D in 10/17.  She had chest pain at admission in setting of CHF + hypertension.  No further chest pain. Mild TnI elevation with no trend.  Suspect demand ischemia due to HTN and volume overload.  - Continue ASA, Plavix, statin.  6. Recent LLE cellulitis: Has been treated, leg looks better.   Length of Stay: 2  Loralie Champagne, MD  07/30/2016, 10:15 AM  Advanced  Heart Failure Team Pager [phone] (M-F; 7a - 4p)  Please contact [facility] for night-coverage after hours (4p -7a ) and weekends on [email or web address]

## 2016-07-31 LAB — CBC
HCT: 33.9 % — ABNORMAL LOW (ref 36.0–46.0)
Hemoglobin: 10.2 g/dL — ABNORMAL LOW (ref 12.0–15.0)
MCH: 25.8 pg — AB (ref 26.0–34.0)
MCHC: 30.1 g/dL (ref 30.0–36.0)
MCV: 85.6 fL (ref 78.0–100.0)
PLATELETS: 173 10*3/uL (ref 150–400)
RBC: 3.96 MIL/uL (ref 3.87–5.11)
RDW: 16.2 % — AB (ref 11.5–15.5)
WBC: 6.8 10*3/uL (ref 4.0–10.5)

## 2016-07-31 LAB — GLUCOSE, CAPILLARY
Glucose-Capillary: 125 mg/dL — ABNORMAL HIGH (ref 65–99)
Glucose-Capillary: 186 mg/dL — ABNORMAL HIGH (ref 65–99)
Glucose-Capillary: 136 mg/dL — ABNORMAL HIGH (ref 65–99)
Glucose-Capillary: 155 mg/dL — ABNORMAL HIGH (ref 65–99)

## 2016-07-31 LAB — BASIC METABOLIC PANEL
Anion gap: 9 (ref 5–15)
BUN: 36 mg/dL — AB (ref 6–20)
CHLORIDE: 99 mmol/L — AB (ref 101–111)
CO2: 27 mmol/L (ref 22–32)
CREATININE: 1.53 mg/dL — AB (ref 0.44–1.00)
Calcium: 8.6 mg/dL — ABNORMAL LOW (ref 8.9–10.3)
GFR calc Af Amer: 39 mL/min — ABNORMAL LOW (ref >60)
GFR calc non Af Amer: 34 mL/min — ABNORMAL LOW (ref >60)
GLUCOSE: 130 mg/dL — AB (ref 65–99)
Potassium: 4.4 mmol/L (ref 3.5–5.1)
Sodium: 135 mmol/L (ref 135–145)

## 2016-07-31 LAB — MAGNESIUM: Magnesium: 2.3 mg/dL (ref 1.7–2.4)

## 2016-07-31 MED ORDER — GABAPENTIN 600 MG PO TABS
300.0000 mg | ORAL_TABLET | Freq: Every day | ORAL | Status: DC
  Administered 2016-07-31 – 2016-08-03 (×4): 300 mg via ORAL
  Filled 2016-07-31 (×4): qty 1

## 2016-07-31 MED ORDER — GABAPENTIN 600 MG PO TABS
600.0000 mg | ORAL_TABLET | Freq: Every day | ORAL | Status: DC
  Administered 2016-07-31 – 2016-08-02 (×3): 600 mg via ORAL
  Filled 2016-07-31 (×3): qty 1

## 2016-07-31 MED ORDER — METOLAZONE 2.5 MG PO TABS
2.5000 mg | ORAL_TABLET | Freq: Once | ORAL | Status: AC
  Administered 2016-07-31: 2.5 mg via ORAL
  Filled 2016-07-31: qty 1

## 2016-07-31 NOTE — Care Management Important Message (Signed)
Important Message  Patient Details  Name: [name] [name] MRN: [id number] Date of Birth: 07/17/1945   Medicare Important Message Given:  Yes    Nathen May 07/31/2016, 9:26 AM

## 2016-07-31 NOTE — Progress Notes (Signed)
Patient ID: [name] [name], female   DOB: [birth year], 71 y.o.   MRN: [id number]     Advanced Heart Failure Rounding Note  Primary Cardiologist: Aundra Dubin  Subjective:    Primary complaint this am Complains of pain in her legs from hips down bilaterally. Has chronic hip pain. Breathing OK. Denies CP.   Negative 3.4 L and down 7 lbs.   Objective:   Weight Range: 192 lb 6.4 oz (87.3 kg) Body mass index is 31.05 kg/m.   Vital Signs:   Temp:  [97.9 F (36.6 C)-98.8 F (37.1 C)] 97.9 F (36.6 C) (07/23 0845) Pulse Rate:  [43-55] 49 (07/23 0639) Resp:  [14-23] 14 (07/23 0400) BP: (109-153)/(31-50) 109/38 (07/23 0845) SpO2:  [91 %-97 %] 96 % (07/23 0400) Weight:  [192 lb 6.4 oz (87.3 kg)-199 lb 11.8 oz (90.6 kg)] 192 lb 6.4 oz (87.3 kg) (07/23 0358) Last BM Date: 07/28/16  Weight change: Filed Weights   07/29/16 0400 07/30/16 2030 07/31/16 0358  Weight: 194 lb 7.1 oz (88.2 kg) 199 lb 11.8 oz (90.6 kg) 192 lb 6.4 oz (87.3 kg)    Intake/Output:   Intake/Output Summary (Last 24 hours) at 07/31/16 0921 Last data filed at 07/31/16 0849  Gross per 24 hour  Intake              360 ml  Output             2050 ml  Net            -1690 ml      Physical Exam    General: Obese, NAD. Uncomfortable. HEENT: Normal Neck: Supple. JVP remains elevated. 10-11 cm. Carotids 2+ bilat; no bruits. No thyromegaly or nodule noted. Cor: PMI nondisplaced. RRR, 2/6 SEM RUSB. Difficult to palpate pedal pulses.  Lungs: Diminished basilar sounds.  Abdomen: Soft, non-tender, non-distended, no HSM. No bruits or masses. +BS  Extremities: No cyanosis, clubbing, rash, Trace ankle edema. Chronic venous stasis changes lower legs.    Neuro: + tremor at rest. Alert & orientedx3, cranial nerves grossly intact. moves all 4 extremities w/o difficulty. Affect pleasant   Telemetry   Personally reviewed, Sinus brady 40-50s   Labs    CBC  Recent Labs  07/28/16 1332  07/29/16 0302 07/31/16 0333  WBC 5.2   < > 6.9 6.8  NEUTROABS 4.1  --   --   --   HGB 10.8*  < > 10.0* 10.2*  HCT 35.9*  < > 33.7* 33.9*  MCV 87.8  < > 86.6 85.6  PLT 157  < > 155 173  < > = values in this interval not displayed. Basic Metabolic Panel  Recent Labs  07/30/16 0146 07/31/16 0333  NA 137 135  K 4.2 4.4  CL 102 99*  CO2 28 27  GLUCOSE 151* 130*  BUN 37* 36*  CREATININE 1.44* 1.53*  CALCIUM 8.8* 8.6*   Liver Function Tests  Recent Labs  07/28/16 1332  AST 20  ALT 15  ALKPHOS 63  BILITOT 0.7  PROT 7.0  ALBUMIN 3.5   No results for input(s): LIPASE, AMYLASE in the last 72 hours. Cardiac Enzymes  Recent Labs  07/28/16 1332 07/28/16 1839 07/29/16 0034  TROPONINI 0.03* 0.03* 0.05*    BNP: BNP (last 3 results)  Recent Labs  11/25/15 1216 03/16/16 1033 07/28/16 1332  BNP 747.2* 542.5* 643.2*    ProBNP (last 3 results) No results for input(s): PROBNP in the last 8760 hours.   D-Dimer No  results for input(s): DDIMER in the last 72 hours. Hemoglobin A1C No results for input(s): HGBA1C in the last 72 hours. Fasting Lipid Panel No results for input(s): CHOL, HDL, LDLCALC, TRIG, CHOLHDL, LDLDIRECT in the last 72 hours. Thyroid Function Tests  Recent Labs  07/28/16 1839  TSH 1.323    Other results:   Imaging    No results found.   Medications:     Scheduled Medications: . amLODipine  10 mg Oral Daily  . aspirin EC  81 mg Oral Daily  . atorvastatin  40 mg Oral q1800  . citalopram  40 mg Oral Daily  . clopidogrel  75 mg Oral Daily  . furosemide  80 mg Intravenous BID  . heparin  5,000 Units Subcutaneous Q8H  . hydrALAZINE  75 mg Oral Q8H  . insulin aspart  0-15 Units Subcutaneous TID WC  . isosorbide mononitrate  30 mg Oral Daily  . levothyroxine  75 mcg Oral QAC breakfast  . losartan  12.5 mg Oral Daily  . multivitamin with minerals  1 tablet Oral Daily  . umeclidinium-vilanterol  1 puff Inhalation Daily    Infusions:   PRN Medications: acetaminophen,  albuterol, HYDROcodone-acetaminophen, morphine injection, nitroGLYCERIN, ondansetron (ZOFRAN) IV, tiZANidine    Patient Profile   71 yo with history of chronic diastolic CHF, pulmonary venous hypertension, CAD s/p CABG, h/o chronic right pleural effusion, CKD stage 3, PAD was admitted with acute on chronic diastolic CHF.   Assessment/Plan   1. Acute on chronic diastolic CHF: Echo (8/45) with EF 55-60%, d-shaped septum, mildly dilated RV, moderate TR, PASP 84 mmHg.  She was only on half her home torsemide dose at a rehab facility and also was not on some of her antihypertensives.  Admitted with hypertensive urgency + CHF.   - Diuresed well with weight down 7 lbs. Mild rise in creatinine to 1.5 but near baseline.  - Need strict I/Os and daily weights, discussed with nursing.  - JVP remains elevated. Continue to diurese with IV lasix 80 mg BID. She had excellent response to metolazone so will repeat x 1.  - Plan for RHC after she is fully diuresed given severe pulmonary hypertension on echo.  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  - She is on home oxygen.   2. CKD: Stage 3.   - Creatinine trending up slightly to 1.53 today. Baseline ~ 1.6 - Continue to follow closely with diuresis.   3. HTN:  - BP stable on home meds.   4. PAD: Difficult to palpate pulses, has pain in legs.  Extensive history, she had her last peripheral intervention in 10/17.  - Peripheral arterial doppler evaluation performed. Results pending.  5. CAD: s/p CABG in 11/12, DES to SVG-D in 10/17.  She had chest pain at admission in setting of CHF + hypertension.  No further chest pain. Mild TnI elevation with no trend.  Suspect demand ischemia due to HTN and volume overload.  - Continue ASA, Plavix, statin.  - No change.   6. Recent LLE cellulitis:  - Improved with treatment.  Looks stable.  7. Chronic hip pain - Exacerbated by position in hospital bed. Check Mg and follow K for  cramps.  - Has prn Norco. Will resume gabapentin  Length of Stay: 3  Shirley Friar, Hershal Coria  07/31/2016, 9:21 AM  Advanced Heart Failure Team Pager [phone] (M-F; 7a - 4p)  Please contact [facility] for night-coverage after hours (4p -7a )  and weekends on [email or web address]  Patient seen with PA, agree with the above note.  She diuresed well yesterday, weight coming down.  Creatinine stable.  Still volume overloaded, continue Lasix at current dose and will add a dose of metolazone.    Can resume gabapentin for neuropathic pain.   Loralie Champagne 07/31/2016

## 2016-08-01 LAB — CBC
HEMATOCRIT: 35.5 % — AB (ref 36.0–46.0)
Hemoglobin: 10.6 g/dL — ABNORMAL LOW (ref 12.0–15.0)
MCH: 25.6 pg — ABNORMAL LOW (ref 26.0–34.0)
MCHC: 29.9 g/dL — ABNORMAL LOW (ref 30.0–36.0)
MCV: 85.7 fL (ref 78.0–100.0)
Platelets: 183 10*3/uL (ref 150–400)
RBC: 4.14 MIL/uL (ref 3.87–5.11)
RDW: 16.2 % — ABNORMAL HIGH (ref 11.5–15.5)
WBC: 7.2 10*3/uL (ref 4.0–10.5)

## 2016-08-01 LAB — BASIC METABOLIC PANEL
Anion gap: 9 (ref 5–15)
BUN: 49 mg/dL — AB (ref 6–20)
CHLORIDE: 97 mmol/L — AB (ref 101–111)
CO2: 31 mmol/L (ref 22–32)
Calcium: 9 mg/dL (ref 8.9–10.3)
Creatinine, Ser: 1.71 mg/dL — ABNORMAL HIGH (ref 0.44–1.00)
GFR calc Af Amer: 34 mL/min — ABNORMAL LOW (ref >60)
GFR calc non Af Amer: 29 mL/min — ABNORMAL LOW (ref >60)
Glucose, Bld: 102 mg/dL — ABNORMAL HIGH (ref 65–99)
POTASSIUM: 4.9 mmol/L (ref 3.5–5.1)
SODIUM: 137 mmol/L (ref 135–145)

## 2016-08-01 LAB — GLUCOSE, CAPILLARY
GLUCOSE-CAPILLARY: 210 mg/dL — AB (ref 65–99)
GLUCOSE-CAPILLARY: 161 mg/dL — AB (ref 65–99)
GLUCOSE-CAPILLARY: 207 mg/dL — AB (ref 65–99)
Glucose-Capillary: 111 mg/dL — ABNORMAL HIGH (ref 65–99)

## 2016-08-01 MED ORDER — METOLAZONE 2.5 MG PO TABS
2.5000 mg | ORAL_TABLET | Freq: Once | ORAL | Status: AC
  Administered 2016-08-01: 2.5 mg via ORAL
  Filled 2016-08-01: qty 1

## 2016-08-01 NOTE — Progress Notes (Signed)
Patient ID: [name] [name], female   DOB: 25-May-1945, 71 y.o.   MRN: [id number]     Advanced Heart Failure Rounding Note  Primary Cardiologist: Aundra Dubin  Subjective:    Breathing "much better". Hips and legs remain painful, but slightly improved on gabapentin. Says she is sleepy now from Morphine earlier today.   Negative 600 cc and down another 2 lbs.    Objective:   Weight Range: 190 lb 9.6 oz (86.5 kg) Body mass index is 30.76 kg/m.   Vital Signs:   Temp:  [98.1 F (36.7 C)-98.8 F (37.1 C)] 98.3 F (36.8 C) (07/24 0833) Pulse Rate:  [43-66] 66 (07/24 0833) Resp:  [13-18] 17 (07/24 0833) BP: (125-153)/(36-50) 153/40 (07/24 0833) SpO2:  [90 %-99 %] 90 % (07/24 0833) Weight:  [190 lb 9.6 oz (86.5 kg)] 190 lb 9.6 oz (86.5 kg) (07/24 0439) Last BM Date: 07/29/16  Weight change: Filed Weights   07/30/16 2030 07/31/16 0358 08/01/16 0439  Weight: 199 lb 11.8 oz (90.6 kg) 192 lb 6.4 oz (87.3 kg) 190 lb 9.6 oz (86.5 kg)    Intake/Output:   Intake/Output Summary (Last 24 hours) at 08/01/16 0946 Last data filed at 08/01/16 0830  Gross per 24 hour  Intake             1005 ml  Output             2075 ml  Net            -1070 ml      Physical Exam    General: Obese, NAD. Sleepy.  HEENT: Normal Neck: Supple. JVP remains elevated 10-12 cm. Carotids 2+ bilat; no bruits. No thyromegaly or nodule noted. Cor: PMI nondisplaced. RRR, 2/6 SEM RUSB. Difficult to palpate pedal pulses.  Lungs: Diminished basilar sounds.  Abdomen: Soft, non-tender, non-distended, no HSM. No bruits or masses. +BS  Extremities: No cyanosis or clubbing. Chronic venous stasis changes. Trace ankle edema.   Neuro: Alert & orientedx3, cranial nerves grossly intact. moves all 4 extremities w/o difficulty. Affect flat.    Telemetry   Personally reviewed, Sinus brady 40-50s    Labs    CBC  Recent Labs  07/31/16 0333 08/01/16 0630  WBC 6.8 7.2  HGB 10.2* 10.6*  HCT 33.9* 35.5*  MCV 85.6 85.7  PLT  173 144   Basic Metabolic Panel  Recent Labs  07/31/16 0333 07/31/16 1002 08/01/16 0630  NA 135  --  137  K 4.4  --  4.9  CL 99*  --  97*  CO2 27  --  31  GLUCOSE 130*  --  102*  BUN 36*  --  49*  CREATININE 1.53*  --  1.71*  CALCIUM 8.6*  --  9.0  MG  --  2.3  --    Liver Function Tests No results for input(s): AST, ALT, ALKPHOS, BILITOT, PROT, ALBUMIN in the last 72 hours. No results for input(s): LIPASE, AMYLASE in the last 72 hours. Cardiac Enzymes No results for input(s): CKTOTAL, CKMB, CKMBINDEX, TROPONINI in the last 72 hours.  BNP: BNP (last 3 results)  Recent Labs  11/25/15 1216 03/16/16 1033 07/28/16 1332  BNP 747.2* 542.5* 643.2*    ProBNP (last 3 results) No results for input(s): PROBNP in the last 8760 hours.   D-Dimer No results for input(s): DDIMER in the last 72 hours. Hemoglobin A1C No results for input(s): HGBA1C in the last 72 hours. Fasting Lipid Panel No results for input(s): CHOL, HDL, LDLCALC,  TRIG, CHOLHDL, LDLDIRECT in the last 72 hours. Thyroid Function Tests No results for input(s): TSH, T4TOTAL, T3FREE, THYROIDAB in the last 72 hours.  Invalid input(s): FREET3  Other results:   Imaging    No results found.   Medications:     Scheduled Medications: . amLODipine  10 mg Oral Daily  . aspirin EC  81 mg Oral Daily  . atorvastatin  40 mg Oral q1800  . citalopram  40 mg Oral Daily  . clopidogrel  75 mg Oral Daily  . furosemide  80 mg Intravenous BID  . gabapentin  300 mg Oral Daily  . gabapentin  600 mg Oral QHS  . heparin  5,000 Units Subcutaneous Q8H  . hydrALAZINE  75 mg Oral Q8H  . insulin aspart  0-15 Units Subcutaneous TID WC  . isosorbide mononitrate  30 mg Oral Daily  . levothyroxine  75 mcg Oral QAC breakfast  . losartan  12.5 mg Oral Daily  . multivitamin with minerals  1 tablet Oral Daily  . umeclidinium-vilanterol  1 puff Inhalation Daily    Infusions:   PRN Medications: acetaminophen, albuterol,  HYDROcodone-acetaminophen, morphine injection, nitroGLYCERIN, ondansetron (ZOFRAN) IV, tiZANidine    Patient Profile   71 yo with history of chronic diastolic CHF, pulmonary venous hypertension, CAD s/p CABG, h/o chronic right pleural effusion, CKD stage 3, PAD was admitted with acute on chronic diastolic CHF.   Assessment/Plan   1. Acute on chronic diastolic CHF: Echo (7/26) with EF 55-60%, d-shaped septum, mildly dilated RV, moderate TR, PASP 84 mmHg.  She was only on half her home torsemide dose at a rehab facility and also was not on some of her antihypertensives.  Admitted with hypertensive urgency + CHF.   - Modest diuresis. Down 2 more lbs.  - Need strict I/Os and daily weights, discussed with nursing.  - Volume status remains elevated. Continue IV lasix 80 mg BID and repeat metolazone today.  - Plan for [place] after she is fully diuresed given severe pulmonary hypertension on echo.  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  - She is on home oxygen.   2. CKD: Stage 3.   - Creatinine up again mildly. Baseline ~ 1.7, so not far off.   - Continue to follow closely with diuresis.   3. HTN:  - BP stable on home meds.   4. PAD: Difficult to palpate pulses, has pain in legs.  Extensive history, she had her last peripheral intervention in 10/17.  - Peripheral arterial doppler evaluation performed. ABIs unremarkable.   5. CAD: s/p CABG in 11/12, DES to SVG-D in 10/17.  She had chest pain at admission in setting of CHF + hypertension.  No further chest pain. Mild TnI elevation with no trend.  Suspect demand ischemia due to HTN and volume overload.  - Continue ASA, Plavix, statin.  - No change.   6. Recent LLE cellulitis:  - Improved with treatment.  Stable.   7. Chronic hip pain - Exacerbated by position in hospital bed. Check Mg and follow K for cramps.  - Has prn Norco and morphine. Now back on gabapentin.  ? RHC tomorrow. Will discuss with MD.    Length of Stay: [address]  Shirley Friar, PA-C  08/01/2016, 9:46 AM  Advanced Heart Failure Team Pager [phone] (M-F; 7a - 4p)  Please contact [facility] for night-coverage after hours (4p -7a ) and weekends on [email or web address]  Patient seen with PA, agree with  the above note.  She remains volume overloaded on exam.  Reasonable diuresis yesterday.  Weight coming down.  Creatinine mildly higher.  Would continue IV Lasix + metolazone today.  Reassess tomorrow, if creatinine stable may benefit from an additional day of aggressive diuresis.  Possible RHC on Thursday.   She needs PT to see, has not been out of bed much.  Came from SNF.   Loralie Champagne 08/01/2016 4:17 PM

## 2016-08-02 LAB — BASIC METABOLIC PANEL
ANION GAP: 8 (ref 5–15)
BUN: 59 mg/dL — AB (ref 6–20)
CO2: 32 mmol/L (ref 22–32)
Calcium: 8.6 mg/dL — ABNORMAL LOW (ref 8.9–10.3)
Chloride: 92 mmol/L — ABNORMAL LOW (ref 101–111)
Creatinine, Ser: 1.87 mg/dL — ABNORMAL HIGH (ref 0.44–1.00)
GFR calc Af Amer: 31 mL/min — ABNORMAL LOW (ref >60)
GFR, EST NON AFRICAN AMERICAN: 26 mL/min — AB (ref >60)
GLUCOSE: 138 mg/dL — AB (ref 65–99)
POTASSIUM: 3.8 mmol/L (ref 3.5–5.1)
Sodium: 132 mmol/L — ABNORMAL LOW (ref 135–145)

## 2016-08-02 LAB — GLUCOSE, CAPILLARY
GLUCOSE-CAPILLARY: 226 mg/dL — AB (ref 65–99)
GLUCOSE-CAPILLARY: 281 mg/dL — AB (ref 65–99)
Glucose-Capillary: 123 mg/dL — ABNORMAL HIGH (ref 65–99)
Glucose-Capillary: 193 mg/dL — ABNORMAL HIGH (ref 65–99)
Glucose-Capillary: 198 mg/dL — ABNORMAL HIGH (ref 65–99)

## 2016-08-02 LAB — CBC
HEMATOCRIT: 33.4 % — AB (ref 36.0–46.0)
HEMOGLOBIN: 10.1 g/dL — AB (ref 12.0–15.0)
MCH: 25.4 pg — AB (ref 26.0–34.0)
MCHC: 30.2 g/dL (ref 30.0–36.0)
MCV: 84.1 fL (ref 78.0–100.0)
Platelets: 178 10*3/uL (ref 150–400)
RBC: 3.97 MIL/uL (ref 3.87–5.11)
RDW: 15.9 % — AB (ref 11.5–15.5)
WBC: 7.7 10*3/uL (ref 4.0–10.5)

## 2016-08-02 MED ORDER — TORSEMIDE 20 MG PO TABS
60.0000 mg | ORAL_TABLET | Freq: Two times a day (BID) | ORAL | Status: DC
  Administered 2016-08-02: 60 mg via ORAL
  Filled 2016-08-02: qty 3

## 2016-08-02 NOTE — Progress Notes (Signed)
Patient ID: [name] [name], female   DOB: 12-04-1945, 71 y.o.   MRN: [id number]     Advanced Heart Failure Rounding Note  Primary Cardiologist: Aundra Dubin  Subjective:    Breathing improved. Primary complaint is hip and leg pain. Denies orthopnea. No lightheadedness or dizziness   Negative 2 L and weight originally showed up 2 lbs. Weight up to 196 with repeat standing weight. ? Accuracy of weights.  Objective:   Weight Range: 196 lb (88.9 kg) Body mass index is 31.64 kg/m.   Vital Signs:   Temp:  [98.8 F (37.1 C)] 98.8 F (37.1 C) (07/24 2100) Pulse Rate:  [44-57] 44 (07/25 0800) Resp:  [12-21] 17 (07/25 0800) BP: (99-141)/(33-68) 134/47 (07/25 0904) SpO2:  [91 %-99 %] 99 % (07/25 0800) Weight:  [192 lb 7.4 oz (87.3 kg)-196 lb (88.9 kg)] 196 lb (88.9 kg) (07/25 1147) Last BM Date: 07/29/16  Weight change: Filed Weights   08/01/16 0439 08/02/16 0300 08/02/16 1147  Weight: 190 lb 9.6 oz (86.5 kg) 192 lb 7.4 oz (87.3 kg) 196 lb (88.9 kg)    Intake/Output:   Intake/Output Summary (Last 24 hours) at 08/02/16 1156 Last data filed at 08/02/16 0418  Gross per 24 hour  Intake                0 ml  Output             1575 ml  Net            -1575 ml      Physical Exam    General: Obese, NAD.  HEENT: Normal Neck: Supple. JVP difficult to body habitus. Appears 8-9 cm. Carotids 2+ bilat; no bruits. No thyromegaly or nodule noted. Cor: PMI nondisplaced. RRR, 2/6 SEM RUSB.  Lungs: Diminished. Abdomen: Soft, non-tender, non-distended, no HSM. No bruits or masses. +BS  Extremities: No cyanosis or clubbing. Chronic venous stasis changes. Trace ankle edema.   Neuro: Alert & orientedx3, cranial nerves grossly intact. moves all 4 extremities w/o difficulty. Affect flat.  Telemetry   Personally reviewed, sinus brady in 40-50s     Labs    CBC  Recent Labs  08/01/16 0630 08/02/16 0403  WBC 7.2 7.7  HGB 10.6* 10.1*  HCT 35.5* 33.4*  MCV 85.7 84.1  PLT 183 532   Basic  Metabolic Panel  Recent Labs  07/31/16 1002 08/01/16 0630 08/02/16 0403  NA  --  137 132*  K  --  4.9 3.8  CL  --  97* 92*  CO2  --  31 32  GLUCOSE  --  102* 138*  BUN  --  49* 59*  CREATININE  --  1.71* 1.87*  CALCIUM  --  9.0 8.6*  MG 2.3  --   --    Liver Function Tests No results for input(s): AST, ALT, ALKPHOS, BILITOT, PROT, ALBUMIN in the last 72 hours. No results for input(s): LIPASE, AMYLASE in the last 72 hours. Cardiac Enzymes No results for input(s): CKTOTAL, CKMB, CKMBINDEX, TROPONINI in the last 72 hours.  BNP: BNP (last 3 results)  Recent Labs  11/25/15 1216 03/16/16 1033 07/28/16 1332  BNP 747.2* 542.5* 643.2*    ProBNP (last 3 results) No results for input(s): PROBNP in the last 8760 hours.   D-Dimer No results for input(s): DDIMER in the last 72 hours. Hemoglobin A1C No results for input(s): HGBA1C in the last 72 hours. Fasting Lipid Panel No results for input(s): CHOL, HDL, LDLCALC, TRIG, CHOLHDL, LDLDIRECT in  the last 72 hours. Thyroid Function Tests No results for input(s): TSH, T4TOTAL, T3FREE, THYROIDAB in the last 72 hours.  Invalid input(s): FREET3  Other results:   Imaging    No results found.   Medications:     Scheduled Medications: . amLODipine  10 mg Oral Daily  . aspirin EC  81 mg Oral Daily  . atorvastatin  40 mg Oral q1800  . citalopram  40 mg Oral Daily  . clopidogrel  75 mg Oral Daily  . furosemide  80 mg Intravenous BID  . gabapentin  300 mg Oral Daily  . gabapentin  600 mg Oral QHS  . heparin  5,000 Units Subcutaneous Q8H  . hydrALAZINE  75 mg Oral Q8H  . insulin aspart  0-15 Units Subcutaneous TID WC  . isosorbide mononitrate  30 mg Oral Daily  . levothyroxine  75 mcg Oral QAC breakfast  . losartan  12.5 mg Oral Daily  . multivitamin with minerals  1 tablet Oral Daily  . umeclidinium-vilanterol  1 puff Inhalation Daily    Infusions:   PRN Medications: acetaminophen, albuterol,  HYDROcodone-acetaminophen, morphine injection, nitroGLYCERIN, ondansetron (ZOFRAN) IV, tiZANidine    Patient Profile   71 yo with history of chronic diastolic CHF, pulmonary venous hypertension, CAD s/p CABG, h/o chronic right pleural effusion, CKD stage 3, PAD was admitted with acute on chronic diastolic CHF.   Assessment/Plan   1. Acute on chronic diastolic CHF: Echo (5/37) with EF 55-60%, d-shaped septum, mildly dilated RV, moderate TR, PASP 84 mmHg.  She was only on half her home torsemide dose at a rehab facility and also was not on some of her antihypertensives.  Admitted with hypertensive urgency + CHF.   - Diuresed well, weights inaccurate. Volume status improved and symptomatically better.  - Need strict I/Os and daily weights, discussed with nursing.  - Volume status looks OK to mildly elevated but creatinine rising. Give morning lasix then transition to po tonight.  - Previous RHC with primarily pulmonary venous HTN and diuresed > 9 L this admit. Will defer RHC for now.  - She is on home oxygen.   2. CKD: Stage 3.   - Creatinine trending up. Transition to po diuresis.   3. HTN:  - BP relatively stable.    4. PAD: Difficult to palpate pulses, has pain in legs.  Extensive history, she had her last peripheral intervention in 10/17.  - Peripheral arterial doppler evaluation performed. ABIs unremarkable. No change.   5. CAD: s/p CABG in 11/12, DES to SVG-D in 10/17.  She had chest pain at admission in setting of CHF + hypertension.  No further chest pain. Mild TnI elevation with no trend.  Suspect demand ischemia due to HTN and volume overload.  - Continue ASA, Plavix, statin.  - No change.   6. Recent LLE cellulitis:  - Improved with treatment.  Stable.   7. Chronic hip pain - Exacerbated by position in hospital bed.   - Has prn Norco and morphine. Now back on gabapentin. Somewhat improved.   PT following. Recommending SNF.   Length of Stay: [place],  [place]  08/02/2016, 11:56 AM  Advanced Heart Failure Team Pager [phone] (M-F; 7a - 4p)  Please contact [facility] for night-coverage after hours (4p -7a ) and weekends on [email or web address]  Patient seen with PA, agree with the above note. Volume status looks better, creatinine rising.  Will transition over to po. Probably would hold off on RHC. Prior RHC  showed pulmonary venous hypertension and suspect this would not have changed.    Very limited mobility.  Will need SNF.   Loralie Champagne 08/02/2016

## 2016-08-02 NOTE — Evaluation (Addendum)
Physical Therapy Evaluation Patient Details Name: [name] [name] MRN: [id number] DOB: July 19, 1945 Today's Date: 08/02/2016   History of Present Illness  Pt is a 71 yo female admitted with chest pains and elevated troponins. Current dx acute on chronic CHF, CAD, CKD stage 3, PAD, LLE cellulitis, and L hip pain.  PMH significant for diastolic CHF, severe pulmonary hypertension, prior CABG, bradycardia, stage III CKD, DM2 and COPD on continuous oxygen.  Clinical Impression  Pt admitted with above diagnosis. Pt currently with functional limitations due to the deficits listed below (see PT Problem List). Pt is minA for bed mobility, transfers and ambulation of 100 feet with RW. Pt limited by hip pain and oxygen desaturation with activity.  Pt will benefit from skilled PT to increase their independence and safety with mobility to allow discharge to the venue listed below.       Follow Up Recommendations SNF    Equipment Recommendations  None recommended by PT    Recommendations for Other Services OT consult     Precautions / Restrictions Precautions Precautions: None Restrictions Weight Bearing Restrictions: Yes      Mobility  Bed Mobility Overal bed mobility: Needs Assistance Bed Mobility: Supine to Sit     Supine to sit: Min assist     General bed mobility comments: minA for trunk to upright and pas scoot of hip to EoB  Transfers Overall transfer level: Needs assistance Equipment used: Rolling walker (2 wheeled) Transfers: Sit to/from Stand Sit to Stand: Min assist;+2 physical assistance         General transfer comment: minAx2 for power up to standing, vc for hand placement and hips scoot to EoB as well as anterior pelvic tilt when in upright  Ambulation/Gait Ambulation/Gait assistance: Min assist Ambulation Distance (Feet): 100 Feet Assistive device: Rolling walker (2 wheeled) Gait Pattern/deviations: Step-through pattern;Steppage;Trunk flexed;Antalgic;Decreased  weight shift to left Gait velocity: slowed Gait velocity interpretation: Below normal speed for age/gender General Gait Details: minA for initial steadying, steady, safe, cadence, steppage gait on R side, decreased L weightbearing due to pain, L hip pain increased from 3/10 to 5/10 with ambulation         Balance Overall balance assessment: Needs assistance Sitting-balance support: No upper extremity supported;Feet supported Sitting balance-Leahy Scale: Fair     Standing balance support: Bilateral upper extremity supported Standing balance-Leahy Scale: Fair Standing balance comment: requires RW assist                             Pertinent Vitals/Pain Pain Assessment: 0-10 Pain Score: 3  Pain Location: L hip Pain Descriptors / Indicators: Aching;Constant;Sore Pain Intervention(s): Monitored during session;Limited activity within patient's tolerance    Home Living Family/patient expects to be discharged to:: Skilled nursing facility                      Prior Function Level of Independence: Independent with assistive device(s);Needs assistance   Gait / Transfers Assistance Needed: walks with RW 100-200 feet at a time with O2  ADL's / Homemaking Assistance Needed: help with bathing and dressing at SNF,         Hand Dominance   Dominant Hand: Right    Extremity/Trunk Assessment   Upper Extremity Assessment Upper Extremity Assessment: Defer to OT evaluation    Lower Extremity Assessment Lower Extremity Assessment: RLE deficits/detail;LLE deficits/detail;Generalized weakness RLE Deficits / Details: strength grossly 3+/5 LLE Deficits / Details: L hip and  knee ROM and strength limited by pain grossly 3/5     Cervical / Trunk Assessment Cervical / Trunk Assessment: Kyphotic  Communication   Communication: No difficulties  Cognition Arousal/Alertness: Awake/alert Behavior During Therapy: WFL for tasks assessed/performed Overall Cognitive Status:  Within Functional Limits for tasks assessed                                        General Comments General comments (skin integrity, edema, etc.): supine BP 127/35, seated BP 136/49, After ambulation BP 132/47, SaO2 on 4L via nasal cannula at rest 96%O2, with ambulation SaO2 on 4L via nasal cannula dropped to 84%O2, pt with 3/4 DOE, with standing rest break and vc for pursed lipped breathing SaO2 returned to 92%O2, HR maximum with ambulation 89 bpm.   Inadvertently added precaution goal that is not applicable.        Assessment/Plan    PT Assessment Patient needs continued PT services  PT Problem List Decreased strength;Decreased range of motion;Decreased activity tolerance;Decreased balance;Decreased mobility;Cardiopulmonary status limiting activity;Pain       PT Treatment Interventions DME instruction;Gait training;Functional mobility training;Therapeutic activities;Therapeutic exercise;Balance training;Patient/family education    PT Goals (Current goals can be found in the Care Plan section)  Acute Rehab PT Goals Patient Stated Goal: get out of hospital  PT Goal Formulation: With patient Time For Goal Achievement: 08/09/16 Potential to Achieve Goals: Fair    Frequency Min 2X/week   Barriers to discharge        Co-evaluation PT/OT/SLP Co-Evaluation/Treatment: Yes Reason for Co-Treatment: Complexity of the patient's impairments (multi-system involvement) PT goals addressed during session: Mobility/safety with mobility         AM-PAC PT "6 Clicks" Daily Activity  Outcome Measure Difficulty turning over in bed (including adjusting bedclothes, sheets and blankets)?: A Lot Difficulty moving from lying on back to sitting on the side of the bed? : Total Difficulty sitting down on and standing up from a chair with arms (e.g., wheelchair, bedside commode, etc,.)?: Total Help needed moving to and from a bed to chair (including a wheelchair)?: A Little Help  needed walking in hospital room?: A Little Help needed climbing 3-5 steps with a railing? : A Lot 6 Click Score: 12    End of Session Equipment Utilized During Treatment: Gait belt;Oxygen Activity Tolerance: Patient tolerated treatment well Patient left: in chair;with call bell/phone within reach;with chair alarm set Nurse Communication: Mobility status PT Visit Diagnosis: Other abnormalities of gait and mobility (R26.89);Muscle weakness (generalized) (M62.81);Difficulty in walking, not elsewhere classified (R26.2);Pain Pain - Right/Left: Left Pain - part of body: Hip    Time: 0825-0850 PT Time Calculation (min) (ACUTE ONLY): 25 min   Charges:   PT Evaluation $PT Eval Moderate Complexity: 1 Procedure     PT G Codes:        Doninique Lwin B. Migdalia Dk PT, DPT Acute Rehabilitation  [phone] Pager [phone]    [place] 08/02/2016, 10:45 AM

## 2016-08-02 NOTE — Progress Notes (Signed)
CARDIAC REHAB PHASE I   PRE:  Rate/Rhythm: 83 SB  BP:  Supine:   Sitting: 150/48  Standing:    SaO2: 94% 4L  MODE:  Ambulation: 80 ft   POST:  Rate/Rhythm: 68 SR  BP:  Supine:   Sitting: 150/40  Standing:    SaO2: 91% 6L 1340-1408 Pt walked 80 ft on 6L with gait belt use, pushing wheelchair with asst x 2. Needed much assistance to stand and get balance as knees were causing a lot of pain for her. Able to walk 80 ft without having to sit. To bed after walk. Back to 4L after walk.    Graylon Good, RN BSN  08/02/2016 2:04 PM

## 2016-08-02 NOTE — Evaluation (Signed)
Occupational Therapy Evaluation and Discharge Patient Details Name: [name] [name] MRN: [id number] DOB: September 19, 1945 Today's Date: 08/02/2016    History of Present Illness Pt is a 71 yo female admitted with chest pains and elevated troponins. Current dx acute on chronic CHF, CAD, CKD stage 3, PAD, LLE cellulitis, and L hip pain.  PMH significant for diastolic CHF, severe pulmonary hypertension, prior CABG, bradycardia, stage III CKD, DM2 and COPD on continuous oxygen.   Clinical Impression   This 71 yo female admitted with above presents to acute OT with deficits below (see OT problem list) thus affecting what she reports as needing only minimal A for bathing/dressing. Recommend return to SNF with follow up OT there to make sure pt gets back to PLOF. Acute OT will sign off.    Follow Up Recommendations  SNF;Supervision/Assistance - 24 hour    Equipment Recommendations  Other (comment) (none)       Precautions / Restrictions Precautions Precautions: None Restrictions Weight Bearing Restrictions: No      Mobility Bed Mobility Overal bed mobility: Needs Assistance Bed Mobility: Supine to Sit     Supine to sit: Min assist     General bed mobility comments: minA for trunk to upright and pas scoot of hip to EoB  Transfers Overall transfer level: Needs assistance Equipment used: Rolling walker (2 wheeled) Transfers: Sit to/from Stand Sit to Stand: Min assist;+2 physical assistance         General transfer comment: minAx2 for power up to standing, vc for hand placement and hips scoot to EoB as well as anterior pelvic tilt when in upright    Balance Overall balance assessment: Needs assistance Sitting-balance support: No upper extremity supported;Feet supported Sitting balance-Leahy Scale: Fair     Standing balance support: Bilateral upper extremity supported Standing balance-Leahy Scale: Fair Standing balance comment: requires RW assist                            ADL either performed or assessed with clinical judgement   ADL Overall ADL's : Needs assistance/impaired Eating/Feeding: Independent;Sitting   Grooming: Set up;Sitting   Upper Body Bathing: Set up;Sitting   Lower Body Bathing: Minimal assistance; Min A +2 to power up for sit to/from stand   Upper Body Dressing : Set up;Sitting   Lower Body Dressing: Moderate assistance Lower Body Dressing Details (indicate cue type and reason): min A +2 to power up for sit<>stand Toilet Transfer: Minimal assistance;Ambulation;RW   Toileting- Clothing Manipulation and Hygiene: Minimal assistance;Sit to/from stand               Vision Patient Visual Report: No change from baseline              Pertinent Vitals/Pain Pain Assessment: 0-10 Pain Score: 3  Pain Location: L hip Pain Descriptors / Indicators: Aching;Constant;Sore Pain Intervention(s): Monitored during session;Limited activity within patient's tolerance     Hand Dominance Right   Extremity/Trunk Assessment Upper Extremity Assessment Upper Extremity Assessment: Overall WFL for tasks assessed   Lower Extremity Assessment Lower Extremity Assessment: RLE deficits/detail;LLE deficits/detail;Generalized weakness RLE Deficits / Details: strength grossly 3+/5 LLE Deficits / Details: L hip and knee ROM and strength limited by pain grossly 3/5    Cervical / Trunk Assessment Cervical / Trunk Assessment: Kyphotic   Communication Communication Communication: No difficulties   Cognition Arousal/Alertness: Awake/alert Behavior During Therapy: WFL for tasks assessed/performed Overall Cognitive Status: Within Functional Limits for tasks assessed  General Comments  supine BP 127/35, seated BP 136/49, After ambulation BP 132/47, SaO2 on 4L via nasal cannula at rest 96%O2, with ambulation SaO2 on 4L via nasal cannula dropped to 84%O2, pt with 3/4 DOE, with standing rest break and  vc for pursed lipped breathing SaO2 returned to 92%O2, HR maximum with ambulation 89 bpm.              Home Living Family/patient expects to be discharged to:: Skilled nursing facility                                        Prior Functioning/Environment Level of Independence: Independent with assistive device(s);Needs assistance  Gait / Transfers Assistance Needed: walks with RW 100-200 feet at a time with O2 ADL's / Homemaking Assistance Needed: help with bathing and dressing at SNF,             OT Problem List: Pain;Impaired balance (sitting and/or standing)         OT Goals(Current goals can be found in the care plan section) Acute Rehab OT Goals Patient Stated Goal: get out of hospital   OT Frequency:             Co-evaluation PT/OT/SLP Co-Evaluation/Treatment: Yes Reason for Co-Treatment: To address functional/ADL transfers PT goals addressed during session: Mobility/safety with mobility OT goals addressed during session: ADL's and self-care;Strengthening/ROM      AM-PAC PT "6 Clicks" Daily Activity     Outcome Measure Help from another person eating meals?: None Help from another person taking care of personal grooming?: A Little Help from another person toileting, which includes using toliet, bedpan, or urinal?: A Little Help from another person bathing (including washing, rinsing, drying)?: A Little Help from another person to put on and taking off regular upper body clothing?: A Little Help from another person to put on and taking off regular lower body clothing?: A Lot 6 Click Score: 18   End of Session Equipment Utilized During Treatment: Gait belt;Rolling walker;Oxygen (4 liters)  Activity Tolerance: Patient tolerated treatment well Patient left: in chair;with call bell/phone within reach;with chair alarm set  OT Visit Diagnosis: Unsteadiness on feet (R26.81)                Time: 0312-8118 OT Time Calculation (min): 26 min Charges:   OT General Charges $OT Visit: 1 Procedure OT Evaluation $OT Eval Moderate Complexity: 1 Procedure Golden Circle, OTR/L [phone] 08/02/2016

## 2016-08-02 NOTE — NC FL2 (Signed)
[place] LEVEL OF CARE SCREENING TOOL     IDENTIFICATION  Patient Name: [name] [name] Birthdate: 09/09/1945 Sex: female Admission Date (Current Location): 07/28/2016  [facility] and [place] Number:  Herbalist and Address:  [facility], [place] [address], [place], [place] [address]      Provider Number: [id number]  Attending Physician Name and Address:  Minus Breeding, MD  Relative Name and Phone Number:  Blanche Scovell, [phone]    Current Level of Care: Hospital Recommended Level of Care: [place] Prior Approval Number:    Date Approved/Denied:   PASRR Number: [id number]  Discharge Plan: SNF    Current Diagnoses: Patient Active Problem List   Diagnosis Date Noted  . Acute on chronic diastolic (congestive) heart failure ([place]) 07/28/2016  . Acquired hypothyroidism 03/17/2016  . Subacute thyroiditis 11/26/2015  . Claudication ([place]) 11/04/2015  . Acute diastolic (congestive) heart failure ([place]) 09/14/2015  . CKD (chronic kidney disease) stage 3, GFR 30-59 ml/min 07/27/2015  . Pleural effusion   . Recurrent pleural effusion on right   . Bradycardia, sinus 10/13/2014  . Palliative care encounter 10/13/2014  . Hypoglycemia 10/12/2014  . Anemia 10/12/2014  . Toe ulcer ([place]) 10/12/2014  . Unresponsive episode 10/12/2014  . Pulmonary hypertension ([place])   . Chest pain   . Pleural effusion, right 09/04/2014  . Pressure ulcer 09/04/2014  . CHF (congestive heart failure) ([place]) 09/04/2014  . Hypertensive urgency 09/03/2014  . PVD (peripheral vascular disease) ([place]) 11/10/2013  . Critical lower limb ischemia 10/06/2013  . Chronic combined systolic and diastolic heart failure ([place]) 03/18/2013  . Diabetes mellitus type II, uncontrolled ([place]) 01/13/2011  . Hx of CABG 2012   . PAD (peripheral artery disease) ([place])   . Carotid artery disease ([place])   . Hypertension, uncontrolled 01/14/2010  . Mixed hyperlipidemia  11/11/2009  . TOBACCO ABUSE 11/11/2009    Orientation RESPIRATION BLADDER Height & Weight     Self, Time, Situation, Place  Normal (4L) Continent Weight: 196 lb (88.9 kg) Height:  _0  (167.6 cm)  BEHAVIORAL SYMPTOMS/MOOD NEUROLOGICAL BOWEL NUTRITION STATUS      Continent Diet (heart healthy/carb modified )  AMBULATORY STATUS COMMUNICATION OF NEEDS Skin   Limited Assist Verbally Normal                       Personal Care Assistance Level of Assistance  Bathing, Feeding, Dressing Bathing Assistance: Limited assistance Feeding assistance: Independent Dressing Assistance: Limited assistance     Functional Limitations Info  Sight, Hearing, Speech Sight Info: Adequate Hearing Info: Adequate Speech Info: Adequate    SPECIAL CARE FACTORS FREQUENCY  PT (By licensed PT), OT (By licensed OT)     PT Frequency: 5/wk OT Frequency: 5/wk             Contractures Contractures Info: Not present    Additional Factors Info  Code Status, Allergies Code Status Info: Full Code Allergies Info: OTHER, STRAWBERRY EXTRACT, SULFA ANTIBIOTICS, COFFEE BEAN EXTRACT COFFEA ARABICA, TAPE           Current Medications (08/02/2016):  This is the current hospital active medication list Current Facility-Administered Medications  Medication Dose Route Frequency Provider Last Rate Last Dose  . acetaminophen (TYLENOL) tablet 650 mg  650 mg Oral Q4H PRN Bhagat, Bhavinkumar, PA   650 mg at 07/28/16 2119  . albuterol (PROVENTIL) (2.5 MG/3ML) 0.083% nebulizer solution 3 mL  3 mL Inhalation Daily PRN Bhagat,  Bhavinkumar, PA      . amLODipine (NORVASC) tablet 10 mg  10 mg Oral Daily Bhagat, Bhavinkumar, PA   10 mg at 08/02/16 0904  . aspirin EC tablet 81 mg  81 mg Oral Daily Bhagat, Bhavinkumar, PA   81 mg at 08/02/16 0905  . atorvastatin (LIPITOR) tablet 40 mg  40 mg Oral q1800 Bhagat, Bhavinkumar, PA   40 mg at 08/01/16 1701  . citalopram (CELEXA) tablet 40 mg  40 mg Oral Daily Bhagat,  Bhavinkumar, PA   40 mg at 08/02/16 0904  . clopidogrel (PLAVIX) tablet 75 mg  75 mg Oral Daily Bhagat, Bhavinkumar, PA   75 mg at 08/02/16 0905  . gabapentin (NEURONTIN) tablet 300 mg  300 mg Oral Daily Shirley Friar, PA-C   300 mg at 08/02/16 5520  . gabapentin (NEURONTIN) tablet 600 mg  600 mg Oral QHS Shirley Friar, PA-C   600 mg at 08/01/16 2223  . heparin injection 5,000 Units  5,000 Units Subcutaneous Q8H Bhagat, Bhavinkumar, PA   5,000 Units at 08/02/16 8022  . hydrALAZINE (APRESOLINE) tablet 75 mg  75 mg Oral Q8H Bhagat, Bhavinkumar, PA   75 mg at 08/02/16 0641  . HYDROcodone-acetaminophen (NORCO/VICODIN) 5-325 MG per tablet 1 tablet  1 tablet Oral Q6H PRN Buford Dresser, MD   1 tablet at 08/02/16 [phone]  . insulin aspart (novoLOG) injection 0-15 Units  0-15 Units Subcutaneous TID WC Bhagat, Bhavinkumar, PA   3 Units at 08/01/16 1701  . isosorbide mononitrate (IMDUR) 24 hr tablet 30 mg  30 mg Oral Daily Bhagat, Bhavinkumar, PA   30 mg at 08/02/16 0904  . levothyroxine (SYNTHROID, LEVOTHROID) tablet 75 mcg  75 mcg Oral QAC breakfast Bhagat, Bhavinkumar, PA   75 mcg at 08/02/16 0904  . losartan (COZAAR) tablet 12.5 mg  12.5 mg Oral Daily Bhagat, Bhavinkumar, PA   12.5 mg at 08/02/16 0905  . morphine 2 MG/ML injection 2 mg  2 mg Intravenous Q4H PRN Reino Bellis B, NP   2 mg at 08/02/16 1057  . multivitamin with minerals tablet 1 tablet  1 tablet Oral Daily Bhagat, Bhavinkumar, PA   1 tablet at 08/02/16 0904  . nitroGLYCERIN (NITROSTAT) SL tablet 0.4 mg  0.4 mg Sublingual Q5 Min x 3 PRN Bhagat, Bhavinkumar, PA      . ondansetron (ZOFRAN) injection 4 mg  4 mg Intravenous Q6H PRN Bhagat, Bhavinkumar, PA   4 mg at 07/30/16 1740  . tiZANidine (ZANAFLEX) tablet 4 mg  4 mg Oral BID PRN Bhagat, Bhavinkumar, PA   4 mg at 08/01/16 2314  . torsemide (DEMADEX) tablet 60 mg  60 mg Oral BID Shirley Friar, PA-C      . umeclidinium-vilanterol (ANORO ELLIPTA) 62.5-25  MCG/INH 1 puff  1 puff Inhalation Daily Bhagat, Bhavinkumar, PA   1 puff at 08/02/16 1208     Discharge Medications: Please see discharge summary for a list of discharge medications.  Relevant Imaging Results:  Relevant Lab Results:   Additional Information [phone]  Wende Neighbors, LCSW

## 2016-08-03 DIAGNOSIS — I5031 Acute diastolic (congestive) heart failure: Secondary | ICD-10-CM | POA: Diagnosis not present

## 2016-08-03 DIAGNOSIS — J9611 Chronic respiratory failure with hypoxia: Secondary | ICD-10-CM | POA: Diagnosis not present

## 2016-08-03 DIAGNOSIS — I272 Pulmonary hypertension, unspecified: Secondary | ICD-10-CM | POA: Diagnosis not present

## 2016-08-03 DIAGNOSIS — E785 Hyperlipidemia, unspecified: Secondary | ICD-10-CM | POA: Diagnosis not present

## 2016-08-03 DIAGNOSIS — I739 Peripheral vascular disease, unspecified: Secondary | ICD-10-CM | POA: Diagnosis not present

## 2016-08-03 DIAGNOSIS — Z7902 Long term (current) use of antithrombotics/antiplatelets: Secondary | ICD-10-CM | POA: Diagnosis not present

## 2016-08-03 DIAGNOSIS — E1122 Type 2 diabetes mellitus with diabetic chronic kidney disease: Secondary | ICD-10-CM | POA: Diagnosis not present

## 2016-08-03 DIAGNOSIS — J9610 Chronic respiratory failure, unspecified whether with hypoxia or hypercapnia: Secondary | ICD-10-CM | POA: Diagnosis not present

## 2016-08-03 DIAGNOSIS — R2689 Other abnormalities of gait and mobility: Secondary | ICD-10-CM | POA: Diagnosis not present

## 2016-08-03 DIAGNOSIS — I5040 Unspecified combined systolic (congestive) and diastolic (congestive) heart failure: Secondary | ICD-10-CM | POA: Diagnosis not present

## 2016-08-03 DIAGNOSIS — Z882 Allergy status to sulfonamides status: Secondary | ICD-10-CM | POA: Diagnosis not present

## 2016-08-03 DIAGNOSIS — I10 Essential (primary) hypertension: Secondary | ICD-10-CM | POA: Diagnosis not present

## 2016-08-03 DIAGNOSIS — I5042 Chronic combined systolic (congestive) and diastolic (congestive) heart failure: Secondary | ICD-10-CM | POA: Diagnosis not present

## 2016-08-03 DIAGNOSIS — Z7982 Long term (current) use of aspirin: Secondary | ICD-10-CM | POA: Diagnosis not present

## 2016-08-03 DIAGNOSIS — I130 Hypertensive heart and chronic kidney disease with heart failure and stage 1 through stage 4 chronic kidney disease, or unspecified chronic kidney disease: Secondary | ICD-10-CM | POA: Diagnosis not present

## 2016-08-03 DIAGNOSIS — Z951 Presence of aortocoronary bypass graft: Secondary | ICD-10-CM | POA: Diagnosis not present

## 2016-08-03 DIAGNOSIS — E1151 Type 2 diabetes mellitus with diabetic peripheral angiopathy without gangrene: Secondary | ICD-10-CM | POA: Diagnosis not present

## 2016-08-03 DIAGNOSIS — Z9981 Dependence on supplemental oxygen: Secondary | ICD-10-CM | POA: Diagnosis not present

## 2016-08-03 DIAGNOSIS — J80 Acute respiratory distress syndrome: Secondary | ICD-10-CM | POA: Diagnosis not present

## 2016-08-03 DIAGNOSIS — G8929 Other chronic pain: Secondary | ICD-10-CM | POA: Diagnosis not present

## 2016-08-03 DIAGNOSIS — F329 Major depressive disorder, single episode, unspecified: Secondary | ICD-10-CM | POA: Diagnosis not present

## 2016-08-03 DIAGNOSIS — N183 Chronic kidney disease, stage 3 (moderate): Secondary | ICD-10-CM | POA: Diagnosis not present

## 2016-08-03 DIAGNOSIS — E119 Type 2 diabetes mellitus without complications: Secondary | ICD-10-CM | POA: Diagnosis not present

## 2016-08-03 DIAGNOSIS — M25559 Pain in unspecified hip: Secondary | ICD-10-CM | POA: Diagnosis not present

## 2016-08-03 DIAGNOSIS — E782 Mixed hyperlipidemia: Secondary | ICD-10-CM | POA: Diagnosis not present

## 2016-08-03 DIAGNOSIS — I509 Heart failure, unspecified: Secondary | ICD-10-CM | POA: Diagnosis not present

## 2016-08-03 DIAGNOSIS — E039 Hypothyroidism, unspecified: Secondary | ICD-10-CM | POA: Diagnosis not present

## 2016-08-03 DIAGNOSIS — I2510 Atherosclerotic heart disease of native coronary artery without angina pectoris: Secondary | ICD-10-CM | POA: Diagnosis not present

## 2016-08-03 DIAGNOSIS — E0590 Thyrotoxicosis, unspecified without thyrotoxic crisis or storm: Secondary | ICD-10-CM | POA: Diagnosis not present

## 2016-08-03 DIAGNOSIS — I5033 Acute on chronic diastolic (congestive) heart failure: Secondary | ICD-10-CM | POA: Diagnosis not present

## 2016-08-03 DIAGNOSIS — M6281 Muscle weakness (generalized): Secondary | ICD-10-CM | POA: Diagnosis not present

## 2016-08-03 DIAGNOSIS — Z91018 Allergy to other foods: Secondary | ICD-10-CM | POA: Diagnosis not present

## 2016-08-03 DIAGNOSIS — Z91048 Other nonmedicinal substance allergy status: Secondary | ICD-10-CM | POA: Diagnosis not present

## 2016-08-03 DIAGNOSIS — R278 Other lack of coordination: Secondary | ICD-10-CM | POA: Diagnosis not present

## 2016-08-03 LAB — CBC
HCT: 33.2 % — ABNORMAL LOW (ref 36.0–46.0)
Hemoglobin: 10.1 g/dL — ABNORMAL LOW (ref 12.0–15.0)
MCH: 25.6 pg — ABNORMAL LOW (ref 26.0–34.0)
MCHC: 30.4 g/dL (ref 30.0–36.0)
MCV: 84.1 fL (ref 78.0–100.0)
PLATELETS: 176 10*3/uL (ref 150–400)
RBC: 3.95 MIL/uL (ref 3.87–5.11)
RDW: 15.8 % — AB (ref 11.5–15.5)
WBC: 8.2 10*3/uL (ref 4.0–10.5)

## 2016-08-03 LAB — BASIC METABOLIC PANEL
Anion gap: 10 (ref 5–15)
BUN: 74 mg/dL — AB (ref 6–20)
CALCIUM: 8.6 mg/dL — AB (ref 8.9–10.3)
CO2: 32 mmol/L (ref 22–32)
CREATININE: 2 mg/dL — AB (ref 0.44–1.00)
Chloride: 90 mmol/L — ABNORMAL LOW (ref 101–111)
GFR, EST AFRICAN AMERICAN: 28 mL/min — AB (ref >60)
GFR, EST NON AFRICAN AMERICAN: 24 mL/min — AB (ref >60)
Glucose, Bld: 126 mg/dL — ABNORMAL HIGH (ref 65–99)
Potassium: 3.8 mmol/L (ref 3.5–5.1)
SODIUM: 132 mmol/L — AB (ref 135–145)

## 2016-08-03 LAB — GLUCOSE, CAPILLARY
GLUCOSE-CAPILLARY: 123 mg/dL — AB (ref 65–99)
Glucose-Capillary: 251 mg/dL — ABNORMAL HIGH (ref 65–99)
Glucose-Capillary: 250 mg/dL — ABNORMAL HIGH (ref 65–99)

## 2016-08-03 MED ORDER — SPIRONOLACTONE 25 MG PO TABS: 12.5000 mg | ORAL_TABLET | Freq: Every day | ORAL | 6 refills | Status: DC

## 2016-08-03 MED ORDER — HYDRALAZINE HCL 25 MG PO TABS: 75.0000 mg | ORAL_TABLET | Freq: Three times a day (TID) | ORAL | 6 refills | Status: DC

## 2016-08-03 MED ORDER — TORSEMIDE 20 MG PO TABS: 60.0000 mg | ORAL_TABLET | Freq: Two times a day (BID) | ORAL | 6 refills | Status: DC

## 2016-08-03 MED ORDER — POTASSIUM CHLORIDE CRYS ER 20 MEQ PO TBCR: 20.0000 meq | EXTENDED_RELEASE_TABLET | Freq: Two times a day (BID) | ORAL | 3 refills | Status: DC

## 2016-08-03 MED ORDER — MAGNESIUM HYDROXIDE 400 MG/5ML PO SUSP: 15.0000 mL | Freq: Every morning | ORAL | Status: DC

## 2016-08-03 NOTE — Progress Notes (Signed)
Pt HR fluctuated between 35 - high 40s.   Pt is alert and oriented and asymptomatic. We'll continue to monitor.

## 2016-08-03 NOTE — Care Management Note (Signed)
Case Management Note Marvetta Gibbons RN, BSN Unit 4E-Case Manager [phone]  Patient Details  Name: [name] [name] MRN: [id number] Date of Birth: Sep 14, 1945  Subjective/Objective:    Pt admitted with HF                Action/Plan: PTA pt was from [place] consulted for return to SNF when medically stable.   Expected Discharge Date:  08/03/16               Expected Discharge Plan:  Skilled Nursing Facility  In-House Referral:  Clinical Social Work  Discharge planning Services  CM Consult  Post Acute Care Choice:  NA Choice offered to:  NA  DME Arranged:    DME Agency:  NA  HH Arranged:  NA HH Agency:  NA  Status of Service:  Completed, signed off  If discussed at [employer] of Stay Meetings, dates discussed:    Discharge Disposition: skilled facility   Additional Comments:  08/03/16- 38- Tanzie Rothschild RN, CM- pt stable for d/c to SNF today per MD- CSW following- per RN family does not want to return to [facility]- and would prefer to go to [facility]F- CSW has been notified of family preference- [facility] can accept pt and per CSW plan will be to d/c to [facility] SNF today. CSW following for placement needs.   Dawayne Patricia, RN 08/03/2016, 12:32 PM

## 2016-08-03 NOTE — Progress Notes (Signed)
Patient ID: [name] [name], female   DOB: January 02, 1946, 71 y.o.   MRN: [id number]     Advanced Heart Failure Rounding Note  Primary Cardiologist: Aundra Dubin  Subjective:    Feeling much better from breathing perspective. Denies SOB, orthopnea, or edema. Hips and legs still primary complaint.   Negative 2.2 L and down 3 more lbs.  Negative 11.3 L total, though weights inaccurate.   Objective:   Weight Range: 193 lb 3.2 oz (87.6 kg) Body mass index is 31.18 kg/m.   Vital Signs:   Temp:  [97.8 F (36.6 C)-98.9 F (37.2 C)] 98 F (36.7 C) (07/26 0405) Pulse Rate:  [39-51] 39 (07/26 0405) Resp:  [11-20] 14 (07/26 0405) BP: (120-150)/(34-70) 127/46 (07/26 0405) SpO2:  [95 %-100 %] 97 % (07/26 0806) Weight:  [193 lb 3.2 oz (87.6 kg)-196 lb (88.9 kg)] 193 lb 3.2 oz (87.6 kg) (07/26 0405) Last BM Date: 07/29/16  Weight change: Filed Weights   08/02/16 0300 08/02/16 1147 08/03/16 0405  Weight: 192 lb 7.4 oz (87.3 kg) 196 lb (88.9 kg) 193 lb 3.2 oz (87.6 kg)    Intake/Output:   Intake/Output Summary (Last 24 hours) at 08/03/16 0835 Last data filed at 08/03/16 0659  Gross per 24 hour  Intake              120 ml  Output             2375 ml  Net            -2255 ml      Physical Exam    General: Elderly appearing. No resp difficulty. HEENT: Normal Neck: Supple. JVP difficult due to body habitus and pulmonary venous hypertension. Appears at least 8-9 cm. Carotids 2+ bilat; no bruits. No thyromegaly or nodule noted. Cor: PMI nondisplaced. RRR, No M/G/R noted Lungs: CTAB, normal effort. Abdomen: Soft, non-tender, non-distended, no HSM. No bruits or masses. +BS  Extremities: No cyanosis, clubbing, rash, R and LLE no edema.  Neuro: Alert & orientedx3, cranial nerves grossly intact. moves all 4 extremities w/o difficulty. Affect flat.  Telemetry   Personally reviewed, sinus brady 40-50s      Labs    CBC  Recent Labs  08/02/16 0403 08/03/16 0328  WBC 7.7 8.2  HGB 10.1*  10.1*  HCT 33.4* 33.2*  MCV 84.1 84.1  PLT 178 694   Basic Metabolic Panel  Recent Labs  07/31/16 1002  08/02/16 0403 08/03/16 0328  NA  --   < > 132* 132*  K  --   < > 3.8 3.8  CL  --   < > 92* 90*  CO2  --   < > 32 32  GLUCOSE  --   < > 138* 126*  BUN  --   < > 59* 74*  CREATININE  --   < > 1.87* 2.00*  CALCIUM  --   < > 8.6* 8.6*  MG 2.3  --   --   --   < > = values in this interval not displayed. Liver Function Tests No results for input(s): AST, ALT, ALKPHOS, BILITOT, PROT, ALBUMIN in the last 72 hours. No results for input(s): LIPASE, AMYLASE in the last 72 hours. Cardiac Enzymes No results for input(s): CKTOTAL, CKMB, CKMBINDEX, TROPONINI in the last 72 hours.  BNP: BNP (last 3 results)  Recent Labs  11/25/15 1216 03/16/16 1033 07/28/16 1332  BNP 747.2* 542.5* 643.2*    ProBNP (last 3 results) No results for input(s):  PROBNP in the last 8760 hours.   D-Dimer No results for input(s): DDIMER in the last 72 hours. Hemoglobin A1C No results for input(s): HGBA1C in the last 72 hours. Fasting Lipid Panel No results for input(s): CHOL, HDL, LDLCALC, TRIG, CHOLHDL, LDLDIRECT in the last 72 hours. Thyroid Function Tests No results for input(s): TSH, T4TOTAL, T3FREE, THYROIDAB in the last 72 hours.  Invalid input(s): FREET3  Other results:   Imaging    No results found.   Medications:     Scheduled Medications: . amLODipine  10 mg Oral Daily  . aspirin EC  81 mg Oral Daily  . atorvastatin  40 mg Oral q1800  . citalopram  40 mg Oral Daily  . clopidogrel  75 mg Oral Daily  . gabapentin  300 mg Oral Daily  . gabapentin  600 mg Oral QHS  . heparin  5,000 Units Subcutaneous Q8H  . hydrALAZINE  75 mg Oral Q8H  . insulin aspart  0-15 Units Subcutaneous TID WC  . isosorbide mononitrate  30 mg Oral Daily  . levothyroxine  75 mcg Oral QAC breakfast  . losartan  12.5 mg Oral Daily  . magnesium hydroxide  15 mL Oral q morning - 10a  . multivitamin  with minerals  1 tablet Oral Daily  . umeclidinium-vilanterol  1 puff Inhalation Daily    Infusions:   PRN Medications: acetaminophen, albuterol, HYDROcodone-acetaminophen, morphine injection, nitroGLYCERIN, ondansetron (ZOFRAN) IV, tiZANidine    Patient Profile   71 yo with history of chronic diastolic CHF, pulmonary venous hypertension, CAD s/p CABG, h/o chronic right pleural effusion, CKD stage 3, PAD was admitted with acute on chronic diastolic CHF.   Assessment/Plan   1. Acute on chronic diastolic CHF: Echo (2/72) with EF 55-60%, d-shaped septum, mildly dilated RV, moderate TR, PASP 84 mmHg.  She was only on half her home torsemide dose at a rehab facility and also was not on some of her antihypertensives.  Admitted with hypertensive urgency + CHF.   - Diuresed well again on po torsemide. Weights inaccurate. Volume status improved and symptomatically much better. Creatinine rising.  - Need strict I/Os and daily weights moving forward.  - Previous RHC with primarily pulmonary venous HTN and diuresed > 11 L this admit. Will defer RHC for now.  - Hold torsemide this am with rise in creatinine. OK for SNF today. Labs next week.  - She is on home oxygen.   2. CKD: Stage 3.   - Creatinine trending up; baseline creatinine 1.6-1.8, so not far off. Holding am torsemide.   3. HTN:  - BP improved this am.     4. PAD: Difficult to palpate pulses, has pain in legs.  Extensive history, she had her last peripheral intervention in 10/17.  - Peripheral arterial doppler evaluation performed. ABIs unremarkable. No change.   5. CAD: s/p CABG in 11/12, DES to SVG-D in 10/17.  She had chest pain at admission in setting of CHF + hypertension.  No further chest pain. Mild TnI elevation with no trend.  Suspect demand ischemia due to HTN and volume overload.  - Continue ASA, Plavix, statin.  - No change.   6. Recent LLE cellulitis:  - Improved with treatment.  Stable.   7. Chronic hip pain -  Exacerbated by position in hospital bed.   - Has prn Norco and morphine. Now back on gabapentin. No change.   PT following. To return to SNF on discharge.    Length of Stay: 6  Shirley Friar, PA-C  08/03/2016, 8:35 AM  Advanced Heart Failure Team Pager [phone] (M-F; 7a - 4p)  Please contact [facility] for night-coverage after hours (4p -7a ) and weekends on [email or web address]  Patient seen with PA, agree with the above note.  Volume status looks better by exam.  I think she is ready to return to SNF. Will need close followup.  Will send out on torsemide 60 mg bid.  Hold am dose today with rise in creatinine to 2 (long-term baseline is around 1.8).    Loralie Champagne 08/03/2016 11:00 AM

## 2016-08-03 NOTE — Progress Notes (Signed)
Discharge to: [facility] Anticipated discharge date: 08/03/16 Family notified: Yes, by phone Transportation by: [facility]  Report #: [phone], Ask for [facility]ia Alert, Room 145  CSW signing off.  Laveda Abbe LCSW [phone]

## 2016-08-03 NOTE — Discharge Summary (Addendum)
Advanced Heart Failure Discharge Note  Discharge Summary   Patient ID: [name] [name] MRN: [id number], DOB/AGE: 1945/03/12 71 y.o. Admit date: 07/28/2016 D/C date:     08/03/2016   Primary Discharge Diagnoses:  1. Acute on chronic diastolic CHF: Echo (7/25) with EF 55-60%, d-shaped septum, mildly dilated RV, moderate TR, PASP 84 mmHg.  2. CKD Stage 3.   3. HTN 4. PAD 5. CAD: s/p CABG in 11/12, DES to SVG-D in 10/17.   6. Recent LLE cellulitis:  7. Chronic hip pain  Hospital Course:    [name] [name] is a 71 y.o. female  with history of chronic diastolic CHF, pulmonary venous hypertension, CAD s/p CABG, h/o chronic right pleural effusion, CKD stage 3, PAD was admitted with acute on chronic diastolic CHF.   Diuresed on IV lasix 60 mg BID with only modest response so increased to 80 mg. Metolazone added with good response.  Pt diuresed a total of 11.6L this admission, though weights were inaccurate.   Initially had considered RHC once diuresed, but pts symptoms improved with diuresis and previous RHC showed primarily pulmonary venous hypertension, which would've improved with diuresis.   Hospital course complicated by exacerbation of her chronic hip and leg pain. Gabapentin had been held on admission and this was resumed with some improvement. Continued prn pain medications.   PT saw and with limited mobility, recommendation made to continue SNF on discharge.    Seen am of 08/03/16 and thought stable for discharge with follow up as below.  With rise in creatinine just above baseline, am dose of torsemide held. [name] held on admission, plan to resume after allowing creatinine to trend down.   ADDENDUM  Pt failed voiding trial and bladder scan showed 400 cc + in bladder. Foley replaced to repeat voiding trial at [facility]. Can refer to urology from there if needed.   Please obtain BMET 08/08/16 and fax to AHF clinic at [phone].  Discharge Weight Range: 193 lbs Discharge Vitals: Blood  pressure 139/67, pulse (!) 57, temperature (!) 97.3 F (36.3 C), temperature source Oral, resp. rate 10, height _0  (1.676 m), weight 193 lb 3.2 oz (87.6 kg), SpO2 93 %.  Labs: Lab Results  Component Value Date   WBC 8.2 08/03/2016   HGB 10.1 (L) 08/03/2016   HCT 33.2 (L) 08/03/2016   MCV 84.1 08/03/2016   PLT 176 08/03/2016    Recent Labs Lab 07/28/16 1332  08/03/16 0328  NA 139  < > 132*  K 5.0  < > 3.8  CL 108  < > 90*  CO2 23  < > 32  BUN 25*  < > 74*  CREATININE 1.26*  < > 2.00*  CALCIUM 8.7*  < > 8.6*  PROT 7.0  --   --   BILITOT 0.7  --   --   ALKPHOS 63  --   --   ALT 15  --   --   AST 20  --   --   GLUCOSE 155*  < > 126*  < > = values in this interval not displayed. Lab Results  Component Value Date   CHOL 78 11/15/2015   HDL 22 (L) 11/15/2015   LDLCALC 36 11/15/2015   TRIG 102 11/15/2015   BNP (last 3 results)  Recent Labs  11/25/15 1216 03/16/16 1033 07/28/16 1332  BNP 747.2* 542.5* 643.2*    ProBNP (last 3 results) No results for input(s): PROBNP in the last 8760 hours.   Diagnostic  Studies/Procedures   No results found.  Discharge Medications   Allergies as of 08/03/2016      Reactions   Other Shortness Of Breath, Rash, Other (See Comments)   CANNOT EAT ANY BERRIES!!   Strawberry Extract Hives, Shortness Of Breath, Rash   Sulfa Antibiotics Swelling, Other (See Comments)   Bodily Swelling   Coffee Bean Extract [coffea Arabica] Itching, Rash   Tape Other (See Comments)   Tears skin.  Please use "paper" tape only.      Medication List    TAKE these medications   acetaminophen 500 MG tablet Commonly known as:  TYLENOL Take 500-1,000 mg by mouth every 6 (six) hours as needed for mild pain or moderate pain.   albuterol 108 (90 Base) MCG/ACT inhaler Commonly known as:  PROVENTIL HFA;VENTOLIN HFA Inhale 2 puffs into the lungs daily as needed for wheezing or shortness of breath.   amLODipine 10 MG tablet Commonly known as:   NORVASC TAKE ONE TABLET BY MOUTH ONCE DAILY   ANORO ELLIPTA 62.5-25 MCG/INH Aepb Generic drug:  umeclidinium-vilanterol Inhale 1 puff into the lungs daily.   aspirin 81 MG EC tablet Take 81 mg by mouth daily.   atorvastatin 40 MG tablet Commonly known as:  LIPITOR Take 40 mg by mouth daily at 6 PM.   citalopram 40 MG tablet Commonly known as:  CELEXA Take 40 mg by mouth every morning.   clopidogrel 75 MG tablet Commonly known as:  PLAVIX TAKE ONE TABLET BY MOUTH ONCE DAILY WITH  BREAKFAST   clotrimazole-betamethasone cream Commonly known as:  LOTRISONE Apply 1 application topically 2 (two) times daily as needed.   gabapentin 300 MG capsule Commonly known as:  NEURONTIN Take 300-600 mg by mouth See admin instructions. 300 mg in the morning and 600 mg prior to bed(time)   HUMALOG KWIKPEN 100 UNIT/ML KiwkPen Generic drug:  insulin lispro Inject 3-7 Units into the muscle 3 (three) times daily.   hydrALAZINE 25 MG tablet Commonly known as:  APRESOLINE Take 3 tablets (75 mg total) by mouth every 8 (eight) hours. What changed:  medication strength  See the new instructions.   HYDROcodone-acetaminophen 5-325 MG tablet Commonly known as:  NORCO/VICODIN Take 1 tablet by mouth 3 (three) times daily as needed.   isosorbide mononitrate 30 MG 24 hr tablet Commonly known as:  IMDUR TAKE ONE TABLET BY MOUTH ONCE DAILY   LEVEMIR 100 UNIT/ML injection Generic drug:  insulin detemir Inject 7 Units into the skin at bedtime.   levothyroxine 75 MCG tablet Commonly known as:  SYNTHROID, LEVOTHROID Take 1 tablet (75 mcg total) by mouth daily before breakfast.   losartan 25 MG tablet Commonly known as:  COZAAR Take 0.5 tablets (12.5 mg total) by mouth daily.   multivitamin with minerals Tabs tablet Take 1 tablet by mouth daily.   nitroGLYCERIN 0.4 MG SL tablet Commonly known as:  NITROSTAT Place 1 tablet (0.4 mg total) under the tongue every 5 (five) minutes as needed for  chest pain. Up to 3 doses. If no relief after 3rd dose, proceed to the ED for an evaluation   nystatin powder Commonly known as:  MYCOSTATIN/NYSTOP Apply 1 application topically 2 (two) times daily as needed.   potassium chloride SA 20 MEQ tablet Commonly known as:  K-DUR,KLOR-CON Take 1 tablet (20 mEq total) by mouth 2 (two) times daily.   spironolactone 25 MG tablet Commonly known as:  ALDACTONE Take 0.5 tablets (12.5 mg total) by mouth daily. Start taking on:  08/05/2016 What changed:  See the new instructions.  These instructions start on 08/05/2016. If you are unsure what to do until then, ask your doctor or other care provider.   STOOL SOFTENER PO Take 200 mg by mouth at bedtime as needed (for constipation).   tiZANidine 4 MG tablet Commonly known as:  ZANAFLEX Take 4 mg by mouth 2 (two) times daily as needed for muscle spasms.   torsemide 20 MG tablet Commonly known as:  DEMADEX Take 3 tablets (60 mg total) by mouth 2 (two) times daily. What changed:  how much to take  how to take this  when to take this  additional instructions       Disposition   The patient will be discharged in stable condition to [facility] Discharge Instructions    Diet - low sodium heart healthy    Complete by:  As directed    Increase activity slowly    Complete by:  As directed       Contact information for follow-up providers    [facility] Follow up on 08/17/2016.   Specialty:  Cardiology Why:  at 3 pm for post hospital follow up. Code for parking is [phone] Contact information: [address] [id number] [place] [phone]           Contact information for after-discharge care    [facility] Follow up.   Specialty:  [place] information: [address] [place] [phone]                     Duration of Discharge Encounter: Greater than 35 minutes   Signed, Annamaria Helling 08/03/2016, 11:58 AM

## 2016-08-03 NOTE — Progress Notes (Signed)
Attempted to call report to [facility]. Placed on hold and phone disconnected. Will try again later.

## 2016-08-03 NOTE — Progress Notes (Signed)
CARDIAC REHAB PHASE I   PRE:  Rate/Rhythm: 57 SB    BP: sitting 146/49    SaO2: 93 4L  MODE:  Ambulation: 140 ft   POST:  Rate/Rhythm: 83 SR    BP: sitting 164/41     SaO2: 85 4L, up to 94 4L with rest and pursed lip breathing  Pt still c/o hip/leg pain. Able to stand slightly better, not leaning back as much. Steady with rollator, assist x2, gait belt, 4L, able to understand her limits, turned around and walked back once she was fatiguing. SaO2  85 4L on return to room. Up with pursed lip breathing and rest. Gave reminders of sodium and daily wts. Voiced understanding.  [place], ACSM 08/03/2016 11:36 AM

## 2016-08-03 NOTE — Progress Notes (Signed)
CSW confirmed with Mardene Celeste at [facility] that patient can discharge to facility today. CSW will fax over DC summary when received and coordinate discharge to [facility] today.  Laveda Abbe, [place] Clinical Social Worker [phone]

## 2016-08-04 DIAGNOSIS — I2510 Atherosclerotic heart disease of native coronary artery without angina pectoris: Secondary | ICD-10-CM | POA: Diagnosis not present

## 2016-08-04 DIAGNOSIS — I739 Peripheral vascular disease, unspecified: Secondary | ICD-10-CM | POA: Diagnosis not present

## 2016-08-04 DIAGNOSIS — E119 Type 2 diabetes mellitus without complications: Secondary | ICD-10-CM | POA: Diagnosis not present

## 2016-08-04 DIAGNOSIS — I5033 Acute on chronic diastolic (congestive) heart failure: Secondary | ICD-10-CM | POA: Diagnosis not present

## 2016-08-16 DIAGNOSIS — I509 Heart failure, unspecified: Secondary | ICD-10-CM | POA: Diagnosis not present

## 2016-08-16 DIAGNOSIS — E119 Type 2 diabetes mellitus without complications: Secondary | ICD-10-CM | POA: Diagnosis not present

## 2016-08-16 DIAGNOSIS — I2510 Atherosclerotic heart disease of native coronary artery without angina pectoris: Secondary | ICD-10-CM | POA: Diagnosis not present

## 2016-08-17 ENCOUNTER — Ambulatory Visit (HOSPITAL_COMMUNITY)
Admit: 2016-08-17 | Discharge: 2016-08-17 | Disposition: A | Discharge status: Home / Self Care | Payer: No Typology Code available for payment source | Attending: Internal Medicine | Admitting: Internal Medicine

## 2016-08-17 VITALS — BP 92/64 | HR 46 | Wt 188.2 lb

## 2016-08-17 DIAGNOSIS — Z9981 Dependence on supplemental oxygen: Secondary | ICD-10-CM | POA: Insufficient documentation

## 2016-08-17 DIAGNOSIS — I5042 Chronic combined systolic (congestive) and diastolic (congestive) heart failure: Secondary | ICD-10-CM | POA: Diagnosis not present

## 2016-08-17 DIAGNOSIS — E785 Hyperlipidemia, unspecified: Secondary | ICD-10-CM | POA: Insufficient documentation

## 2016-08-17 DIAGNOSIS — Z951 Presence of aortocoronary bypass graft: Secondary | ICD-10-CM | POA: Diagnosis not present

## 2016-08-17 DIAGNOSIS — E782 Mixed hyperlipidemia: Secondary | ICD-10-CM | POA: Diagnosis not present

## 2016-08-17 DIAGNOSIS — Z7982 Long term (current) use of aspirin: Secondary | ICD-10-CM | POA: Insufficient documentation

## 2016-08-17 DIAGNOSIS — J9611 Chronic respiratory failure with hypoxia: Secondary | ICD-10-CM

## 2016-08-17 DIAGNOSIS — I10 Essential (primary) hypertension: Secondary | ICD-10-CM | POA: Diagnosis not present

## 2016-08-17 DIAGNOSIS — N183 Chronic kidney disease, stage 3 unspecified: Secondary | ICD-10-CM

## 2016-08-17 DIAGNOSIS — I5033 Acute on chronic diastolic (congestive) heart failure: Secondary | ICD-10-CM | POA: Insufficient documentation

## 2016-08-17 DIAGNOSIS — J9610 Chronic respiratory failure, unspecified whether with hypoxia or hypercapnia: Secondary | ICD-10-CM | POA: Diagnosis not present

## 2016-08-17 DIAGNOSIS — I272 Pulmonary hypertension, unspecified: Secondary | ICD-10-CM | POA: Diagnosis not present

## 2016-08-17 DIAGNOSIS — E0590 Thyrotoxicosis, unspecified without thyrotoxic crisis or storm: Secondary | ICD-10-CM | POA: Insufficient documentation

## 2016-08-17 DIAGNOSIS — Z7902 Long term (current) use of antithrombotics/antiplatelets: Secondary | ICD-10-CM | POA: Insufficient documentation

## 2016-08-17 DIAGNOSIS — I130 Hypertensive heart and chronic kidney disease with heart failure and stage 1 through stage 4 chronic kidney disease, or unspecified chronic kidney disease: Secondary | ICD-10-CM | POA: Insufficient documentation

## 2016-08-17 DIAGNOSIS — E1122 Type 2 diabetes mellitus with diabetic chronic kidney disease: Secondary | ICD-10-CM | POA: Insufficient documentation

## 2016-08-17 DIAGNOSIS — I739 Peripheral vascular disease, unspecified: Secondary | ICD-10-CM

## 2016-08-17 DIAGNOSIS — I2510 Atherosclerotic heart disease of native coronary artery without angina pectoris: Secondary | ICD-10-CM | POA: Insufficient documentation

## 2016-08-17 LAB — BASIC METABOLIC PANEL
ANION GAP: 10 (ref 5–15)
BUN: 81 mg/dL — ABNORMAL HIGH (ref 6–20)
CALCIUM: 9.1 mg/dL (ref 8.9–10.3)
CO2: 25 mmol/L (ref 22–32)
Chloride: 100 mmol/L — ABNORMAL LOW (ref 101–111)
Creatinine, Ser: 2.17 mg/dL — ABNORMAL HIGH (ref 0.44–1.00)
GFR, EST AFRICAN AMERICAN: 26 mL/min — AB (ref >60)
GFR, EST NON AFRICAN AMERICAN: 22 mL/min — AB (ref >60)
GLUCOSE: 187 mg/dL — AB (ref 65–99)
Potassium: 6.2 mmol/L — ABNORMAL HIGH (ref 3.5–5.1)
SODIUM: 135 mmol/L (ref 135–145)

## 2016-08-17 NOTE — Progress Notes (Signed)
Advanced Heart Failure Clinic Note   PCP: Dr. Woody Seller Cardiology: Dr. Domenic Polite HF Cardiology: Dr. Aundra Dubin  71 yo with history of chronic diastolic CHF, CAD s/p CABG, chronic right pleural effusion, CKD stage III, and PAD. She has had multiple admissions with diastolic CHF complicated by CKD.  She was admitted in 7/17 for diuresis.  Worsening renal function was noted.  Lisinopril was stopped with elevated creatinine and Coreg was stopped with bradycardia. Echo in 7/17 showed EF 55-60% with grade II diastolic dysfunction and D-shaped interventricular septum.  She has been wearing home oxygen for > 1 year.    She was admitted again in 9/17 with volume overload.  She was diuresed extensively.  Pleurx catheter was removed from right chest.  RHC showed elevated right and left heart filling pressure with pulmonary venous hypertension.  She was diagnosed with hyperthyroidism and started on methimazole.  She was seen by Dr Dorris Fetch (endocrinology) and thought to have subacute thyroiditis rather than Graves disease.  She is now off methimazole and thyroid indices have normalized.   After discharge, in 10/17 she had peripheral angiography PV => right SFA and right tibioperoneal trunk angioplasties.  She now has only rare claudication.     Echo in 06/2016 with EF 55-60%, grade 2 dd, PAP 8m Hg.   Admitted 07/28/16-08/03/16 with acute on chronic diastolic CHF. Diuresed with IV lasix 60 mg BID and metolazone. Hospital course complicated by chronic hip and leg pain, gabapentin was restarted with improvement of symptoms. Had some urinary retention, discharged to SNF with foley. Discharge weight was 193 pounds.   She returns today for post hospital follow up. Overall, feeling well but tired says that she is working hard with PT at the SNF. No SOB with walking, no SOB at rest. Denies orthopnea, PND, palpitations and chest pain. All medications given by facility. Eating well, eating some high salt foods. Drinking less than 2L  a day. Weight down 5 pounds since she left the hospital. Denies dizziness, lightheadedness.   Labs (7/17): K 4.2, creatinine 2.04 Labs (10/17): K 4.5, creatinine 1.68, HCT 30.5 Labs (11/17): K 3.9, creatinine 1.78, LDL 36 Labs (12/17): K 3.9, creatinine 1.49 => 1.53  PMH: 1. Chronic diastolic CHF: Multiple admissions.  - Echo (7/17) with EF 55-60%, grade II diastolic dysfunction, D-shaped interventricular septum, mild MR, PASP 52 mmHg.  - Uses home oxygen.  - RHC (9/17): mean 15, PA 73/26 mean 42, mean PCWP 25, CI 2.84, PVR 3.0 WU => pulmonary venous hypertension.  - Echo (6/18): EF 55-60%, PASP 84 mm Hg.  2. H/o CVA 3. Recurrent right pleural effusion: S/p Pleurx catheter placement.  4. Depression 5. HTN 6. Carotid stenosis: Bilateral CEAs 7. Type II diabetes.  8. CAD: NSTEMI 11/12, had CABG with LIMA-LAD, SVG-PLV, SVG-OM, SVG-D.   - LHC (9/15) with DES to SVG-D.  Other grafts patent.  9. PAD: Left femoral-popliteal bypass 9/15.  - peripheral arterial dopplers (2/17) with right ABI 0.53, left ABI 1.02.  - 10/17 right SFA atherectomy and right tibioperoneal trunk atherectomy.  10. CKD stage III. Lisinopril stopped with increased creatinine.  11. Bradycardia: Stopped Coreg.  12. Hyperthyroidism: Diagnosed in 9/17. Subacute thyroiditis.  Followed by Dr. NDorris Fetch  13. Sleep study 12/17 without significant OSA.   [place].  Prior smoker, quit 2012.  Retired.   Family History  Problem Relation Age of Onset  . Coronary artery disease Father        Died with MI age  13  . Heart disease Father        before age 71  . Heart attack Father   . Diabetes type II Mother   . Hypertension Mother   . Diabetes Mother   . Heart disease Mother   . Hyperlipidemia Mother   . Heart failure Sister   . Cancer Sister   . Cancer Brother        Lung cancer  . Diabetes Daughter   . Hyperlipidemia Daughter   . Diabetes Son   . Hyperlipidemia Son    Review of systems complete and  found to be negative unless listed in HPI.    BP 92/64 (BP Location: Right Arm, Patient Position: Sitting, Cuff Size: Normal)   Pulse (!) 46   Wt 188 lb 3.2 oz (85.4 kg)   SpO2 (!) 88% Comment: on 2L  BMI 30.38 kg/m    Wt Readings from Last 3 Encounters:  08/17/16 188 lb 3.2 oz (85.4 kg)  08/03/16 193 lb 3.2 oz (87.6 kg)  06/23/16 193 lb (87.5 kg)    Current Outpatient Prescriptions on File Prior to Encounter  Medication Sig Dispense Refill  . acetaminophen (TYLENOL) 500 MG tablet Take 500-1,000 mg by mouth every 6 (six) hours as needed for mild pain or moderate pain.     [name] albuterol (PROVENTIL HFA;VENTOLIN HFA) 108 (90 BASE) MCG/ACT inhaler Inhale 2 puffs into the lungs daily as needed for wheezing or shortness of breath.     [name] amLODipine (NORVASC) 10 MG tablet TAKE ONE TABLET BY MOUTH ONCE DAILY 90 tablet 3  . aspirin 81 MG EC tablet Take 81 mg by mouth daily.    [name] atorvastatin (LIPITOR) 40 MG tablet Take 40 mg by mouth daily at 6 PM.    . citalopram (CELEXA) 40 MG tablet Take 40 mg by mouth every morning.     . clopidogrel (PLAVIX) 75 MG tablet TAKE ONE TABLET BY MOUTH ONCE DAILY WITH  BREAKFAST 30 tablet 6  . clotrimazole-betamethasone (LOTRISONE) cream Apply 1 application topically 2 (two) times daily as needed.    Mariane Baumgarten Calcium (STOOL SOFTENER PO) Take 200 mg by mouth at bedtime as needed (for constipation).     . gabapentin (NEURONTIN) 300 MG capsule Take 300-600 mg by mouth See admin instructions. 300 mg in the morning and 600 mg prior to bed(time)    . HUMALOG KWIKPEN 100 UNIT/ML KiwkPen Inject 3-7 Units into the muscle 3 (three) times daily.    . hydrALAZINE (APRESOLINE) 25 MG tablet Take 3 tablets (75 mg total) by mouth every 8 (eight) hours. 270 tablet 6  . HYDROcodone-acetaminophen (NORCO/VICODIN) 5-325 MG tablet Take 1 tablet by mouth 3 (three) times daily as needed.    . insulin detemir (LEVEMIR) 100 UNIT/ML injection Inject 7 Units into the skin at bedtime.    .  isosorbide mononitrate (IMDUR) 30 MG 24 hr tablet TAKE ONE TABLET BY MOUTH ONCE DAILY 30 tablet 3  . levothyroxine (SYNTHROID, LEVOTHROID) 75 MCG tablet Take 1 tablet (75 mcg total) by mouth daily before breakfast. 30 tablet 3  . losartan (COZAAR) 25 MG tablet Take 0.5 tablets (12.5 mg total) by mouth daily. 15 tablet 3  . Multiple Vitamin (MULTIVITAMIN WITH MINERALS) TABS tablet Take 1 tablet by mouth daily.    . nitroGLYCERIN (NITROSTAT) 0.4 MG SL tablet Place 1 tablet (0.4 mg total) under the tongue every 5 (five) minutes as needed for chest pain. Up to 3 doses. If no relief after 3rd  dose, proceed to the ED for an evaluation 25 tablet 3  . nystatin (MYCOSTATIN/NYSTOP) powder Apply 1 application topically 2 (two) times daily as needed.    . potassium chloride SA (K-DUR,KLOR-CON) 20 MEQ tablet Take 1 tablet (20 mEq total) by mouth 2 (two) times daily. 60 tablet 3  . spironolactone (ALDACTONE) 25 MG tablet Take 0.5 tablets (12.5 mg total) by mouth daily. 16 tablet 6  . tiZANidine (ZANAFLEX) 4 MG tablet Take 4 mg by mouth 2 (two) times daily as needed for muscle spasms.    [name] torsemide (DEMADEX) 20 MG tablet Take 3 tablets (60 mg total) by mouth 2 (two) times daily. 240 tablet 6  . umeclidinium-vilanterol (ANORO ELLIPTA) 62.5-25 MCG/INH AEPB Inhale 1 puff into the lungs daily.     No current facility-administered medications on file prior to encounter.    General: Female, NAD. Arrived in wheelchair.  Neck: Supple. JVP flat.  No thyromegaly or nodule noted.   Lungs: Clear bilaterally. Normal effort.  CV: PMI non displaced. 1/6 SEM RUSB.  No carotid bruit. No peripheral edema.  Abdomen: Soft, non tender, non distended, no HSM. No bruits or masses. Bowel sounds present.  Skin: Intact without lesions or rashes.  Neurologic: A& O x 3. Cranial nerves grossly intact. Moves all 4 extremities w/o difficulty. Affect pleasant    Psych: Normal affect.  Extremities: No clubbing/cyanosis.   HEENT: Normal.    Assessment/plan: 1. Chronic diastolic CHF: Echo EF 54-49%, mildly dilated RV. - NYHA II - Volume status stable to dry.  - No beta blocker with history of bradycardia.  - Continue torsemide 60 mg BID. Will get BMET today, if creatinine bumps, would reduce torsemide to 60 mg in the am and 40 mg in the pm.  - Continue Spiro 25 mg daily.   2. Pulmonary HTN - Right heart cath in Sept. 2017 with PA 73/26 mean 42. PVR 3.0 WU.  - Related to chronic respiratory failure - Sleep study negative for OSA.  - Normal PFT's before CABG in 2012 - Stable, continues on home oxygen.    3. CKD stage III:  - BMET today.  - Baseline creatinine 1.5-1.7  4. CAD: s/p CABG in 2012, history of PCI to SVG to diag. In 09/2013 - Denies chest pain.  - Continue ASA and Plavix.   5. Recurrent right pleural effusion:  - Stable, lung sounds clear.   6. PAD: s/p recent right leg atherectomies.  - Minimal symptoms. Continue ASA and Plavix.   7. Hyperlipidemia:  - LDL 36 in 11/2015 - Continue atorvastatin.   8. Hyperthyroidism:  - Follows with Dr. Dorris Fetch.    9. HTN - Stable, soft BP today.   10. Deconditioning - Progressing well with PT according to the patient.  - Will be discharged from SNF in 2-3 weeks.    Follow up in 2 months with Dr. Aundra Dubin.    Arbutus Leas, NP  08/17/2016 3:31 PM

## 2016-08-21 ENCOUNTER — Ambulatory Visit: Payer: Medicare Other | Admitting: Cardiology

## 2016-08-25 DIAGNOSIS — E039 Hypothyroidism, unspecified: Secondary | ICD-10-CM | POA: Diagnosis not present

## 2016-08-25 DIAGNOSIS — F329 Major depressive disorder, single episode, unspecified: Secondary | ICD-10-CM | POA: Diagnosis not present

## 2016-08-25 DIAGNOSIS — I2510 Atherosclerotic heart disease of native coronary artery without angina pectoris: Secondary | ICD-10-CM | POA: Diagnosis not present

## 2016-08-25 DIAGNOSIS — N183 Chronic kidney disease, stage 3 (moderate): Secondary | ICD-10-CM | POA: Diagnosis not present

## 2016-08-25 DIAGNOSIS — E1122 Type 2 diabetes mellitus with diabetic chronic kidney disease: Secondary | ICD-10-CM | POA: Diagnosis not present

## 2016-08-25 DIAGNOSIS — I252 Old myocardial infarction: Secondary | ICD-10-CM | POA: Diagnosis not present

## 2016-08-25 DIAGNOSIS — R2689 Other abnormalities of gait and mobility: Secondary | ICD-10-CM | POA: Diagnosis not present

## 2016-08-25 DIAGNOSIS — M170 Bilateral primary osteoarthritis of knee: Secondary | ICD-10-CM | POA: Diagnosis not present

## 2016-08-25 DIAGNOSIS — Z87891 Personal history of nicotine dependence: Secondary | ICD-10-CM | POA: Diagnosis not present

## 2016-08-25 DIAGNOSIS — E785 Hyperlipidemia, unspecified: Secondary | ICD-10-CM | POA: Diagnosis not present

## 2016-08-25 DIAGNOSIS — Z794 Long term (current) use of insulin: Secondary | ICD-10-CM | POA: Diagnosis not present

## 2016-08-25 DIAGNOSIS — I5033 Acute on chronic diastolic (congestive) heart failure: Secondary | ICD-10-CM | POA: Diagnosis not present

## 2016-08-25 DIAGNOSIS — I272 Pulmonary hypertension, unspecified: Secondary | ICD-10-CM | POA: Diagnosis not present

## 2016-08-25 DIAGNOSIS — Z9981 Dependence on supplemental oxygen: Secondary | ICD-10-CM | POA: Diagnosis not present

## 2016-08-25 DIAGNOSIS — I130 Hypertensive heart and chronic kidney disease with heart failure and stage 1 through stage 4 chronic kidney disease, or unspecified chronic kidney disease: Secondary | ICD-10-CM | POA: Diagnosis not present

## 2016-08-25 DIAGNOSIS — Z951 Presence of aortocoronary bypass graft: Secondary | ICD-10-CM | POA: Diagnosis not present

## 2016-08-25 DIAGNOSIS — E1151 Type 2 diabetes mellitus with diabetic peripheral angiopathy without gangrene: Secondary | ICD-10-CM | POA: Diagnosis not present

## 2016-08-25 DIAGNOSIS — L989 Disorder of the skin and subcutaneous tissue, unspecified: Secondary | ICD-10-CM | POA: Diagnosis not present

## 2016-08-28 DIAGNOSIS — Z299 Encounter for prophylactic measures, unspecified: Secondary | ICD-10-CM | POA: Diagnosis not present

## 2016-08-28 DIAGNOSIS — E1165 Type 2 diabetes mellitus with hyperglycemia: Secondary | ICD-10-CM | POA: Diagnosis not present

## 2016-08-28 DIAGNOSIS — I639 Cerebral infarction, unspecified: Secondary | ICD-10-CM | POA: Diagnosis not present

## 2016-08-28 DIAGNOSIS — E1151 Type 2 diabetes mellitus with diabetic peripheral angiopathy without gangrene: Secondary | ICD-10-CM | POA: Diagnosis not present

## 2016-08-28 DIAGNOSIS — E1142 Type 2 diabetes mellitus with diabetic polyneuropathy: Secondary | ICD-10-CM | POA: Diagnosis not present

## 2016-08-28 DIAGNOSIS — E1122 Type 2 diabetes mellitus with diabetic chronic kidney disease: Secondary | ICD-10-CM | POA: Diagnosis not present

## 2016-08-28 DIAGNOSIS — N183 Chronic kidney disease, stage 3 (moderate): Secondary | ICD-10-CM | POA: Diagnosis not present

## 2016-08-28 DIAGNOSIS — I130 Hypertensive heart and chronic kidney disease with heart failure and stage 1 through stage 4 chronic kidney disease, or unspecified chronic kidney disease: Secondary | ICD-10-CM | POA: Diagnosis not present

## 2016-08-28 DIAGNOSIS — E785 Hyperlipidemia, unspecified: Secondary | ICD-10-CM | POA: Diagnosis not present

## 2016-08-28 DIAGNOSIS — I509 Heart failure, unspecified: Secondary | ICD-10-CM | POA: Diagnosis not present

## 2016-08-28 DIAGNOSIS — I10 Essential (primary) hypertension: Secondary | ICD-10-CM | POA: Diagnosis not present

## 2016-08-28 DIAGNOSIS — I2510 Atherosclerotic heart disease of native coronary artery without angina pectoris: Secondary | ICD-10-CM | POA: Diagnosis not present

## 2016-08-28 DIAGNOSIS — E039 Hypothyroidism, unspecified: Secondary | ICD-10-CM | POA: Diagnosis not present

## 2016-08-28 DIAGNOSIS — I5033 Acute on chronic diastolic (congestive) heart failure: Secondary | ICD-10-CM | POA: Diagnosis not present

## 2016-08-28 DIAGNOSIS — Z6832 Body mass index (BMI) 32.0-32.9, adult: Secondary | ICD-10-CM | POA: Diagnosis not present

## 2016-08-28 DIAGNOSIS — G20 Parkinson's disease: Secondary | ICD-10-CM | POA: Diagnosis not present

## 2016-08-28 DIAGNOSIS — M545 Low back pain: Secondary | ICD-10-CM | POA: Diagnosis not present

## 2016-08-28 DIAGNOSIS — F329 Major depressive disorder, single episode, unspecified: Secondary | ICD-10-CM | POA: Diagnosis not present

## 2016-08-29 DIAGNOSIS — E1122 Type 2 diabetes mellitus with diabetic chronic kidney disease: Secondary | ICD-10-CM | POA: Diagnosis not present

## 2016-08-29 DIAGNOSIS — I2510 Atherosclerotic heart disease of native coronary artery without angina pectoris: Secondary | ICD-10-CM | POA: Diagnosis not present

## 2016-08-29 DIAGNOSIS — I5033 Acute on chronic diastolic (congestive) heart failure: Secondary | ICD-10-CM | POA: Diagnosis not present

## 2016-08-29 DIAGNOSIS — I130 Hypertensive heart and chronic kidney disease with heart failure and stage 1 through stage 4 chronic kidney disease, or unspecified chronic kidney disease: Secondary | ICD-10-CM | POA: Diagnosis not present

## 2016-08-29 DIAGNOSIS — N183 Chronic kidney disease, stage 3 (moderate): Secondary | ICD-10-CM | POA: Diagnosis not present

## 2016-08-29 DIAGNOSIS — E1151 Type 2 diabetes mellitus with diabetic peripheral angiopathy without gangrene: Secondary | ICD-10-CM | POA: Diagnosis not present

## 2016-08-30 DIAGNOSIS — E1151 Type 2 diabetes mellitus with diabetic peripheral angiopathy without gangrene: Secondary | ICD-10-CM | POA: Diagnosis not present

## 2016-08-30 DIAGNOSIS — I2510 Atherosclerotic heart disease of native coronary artery without angina pectoris: Secondary | ICD-10-CM | POA: Diagnosis not present

## 2016-08-30 DIAGNOSIS — I130 Hypertensive heart and chronic kidney disease with heart failure and stage 1 through stage 4 chronic kidney disease, or unspecified chronic kidney disease: Secondary | ICD-10-CM | POA: Diagnosis not present

## 2016-08-30 DIAGNOSIS — E1122 Type 2 diabetes mellitus with diabetic chronic kidney disease: Secondary | ICD-10-CM | POA: Diagnosis not present

## 2016-08-30 DIAGNOSIS — N183 Chronic kidney disease, stage 3 (moderate): Secondary | ICD-10-CM | POA: Diagnosis not present

## 2016-08-30 DIAGNOSIS — I5033 Acute on chronic diastolic (congestive) heart failure: Secondary | ICD-10-CM | POA: Diagnosis not present

## 2016-08-31 DIAGNOSIS — I2510 Atherosclerotic heart disease of native coronary artery without angina pectoris: Secondary | ICD-10-CM | POA: Diagnosis not present

## 2016-08-31 DIAGNOSIS — E1151 Type 2 diabetes mellitus with diabetic peripheral angiopathy without gangrene: Secondary | ICD-10-CM | POA: Diagnosis not present

## 2016-08-31 DIAGNOSIS — E1122 Type 2 diabetes mellitus with diabetic chronic kidney disease: Secondary | ICD-10-CM | POA: Diagnosis not present

## 2016-08-31 DIAGNOSIS — I130 Hypertensive heart and chronic kidney disease with heart failure and stage 1 through stage 4 chronic kidney disease, or unspecified chronic kidney disease: Secondary | ICD-10-CM | POA: Diagnosis not present

## 2016-08-31 DIAGNOSIS — N183 Chronic kidney disease, stage 3 (moderate): Secondary | ICD-10-CM | POA: Diagnosis not present

## 2016-08-31 DIAGNOSIS — I5033 Acute on chronic diastolic (congestive) heart failure: Secondary | ICD-10-CM | POA: Diagnosis not present

## 2016-09-01 DIAGNOSIS — N183 Chronic kidney disease, stage 3 (moderate): Secondary | ICD-10-CM | POA: Diagnosis not present

## 2016-09-01 DIAGNOSIS — I130 Hypertensive heart and chronic kidney disease with heart failure and stage 1 through stage 4 chronic kidney disease, or unspecified chronic kidney disease: Secondary | ICD-10-CM | POA: Diagnosis not present

## 2016-09-01 DIAGNOSIS — I5033 Acute on chronic diastolic (congestive) heart failure: Secondary | ICD-10-CM | POA: Diagnosis not present

## 2016-09-01 DIAGNOSIS — I2510 Atherosclerotic heart disease of native coronary artery without angina pectoris: Secondary | ICD-10-CM | POA: Diagnosis not present

## 2016-09-01 DIAGNOSIS — E1151 Type 2 diabetes mellitus with diabetic peripheral angiopathy without gangrene: Secondary | ICD-10-CM | POA: Diagnosis not present

## 2016-09-01 DIAGNOSIS — E1122 Type 2 diabetes mellitus with diabetic chronic kidney disease: Secondary | ICD-10-CM | POA: Diagnosis not present

## 2016-09-04 DIAGNOSIS — I130 Hypertensive heart and chronic kidney disease with heart failure and stage 1 through stage 4 chronic kidney disease, or unspecified chronic kidney disease: Secondary | ICD-10-CM | POA: Diagnosis not present

## 2016-09-04 DIAGNOSIS — I2510 Atherosclerotic heart disease of native coronary artery without angina pectoris: Secondary | ICD-10-CM | POA: Diagnosis not present

## 2016-09-04 DIAGNOSIS — E1122 Type 2 diabetes mellitus with diabetic chronic kidney disease: Secondary | ICD-10-CM | POA: Diagnosis not present

## 2016-09-04 DIAGNOSIS — I5033 Acute on chronic diastolic (congestive) heart failure: Secondary | ICD-10-CM | POA: Diagnosis not present

## 2016-09-04 DIAGNOSIS — N183 Chronic kidney disease, stage 3 (moderate): Secondary | ICD-10-CM | POA: Diagnosis not present

## 2016-09-04 DIAGNOSIS — E1151 Type 2 diabetes mellitus with diabetic peripheral angiopathy without gangrene: Secondary | ICD-10-CM | POA: Diagnosis not present

## 2016-09-05 ENCOUNTER — Other Ambulatory Visit (HOSPITAL_COMMUNITY): Payer: Self-pay | Admitting: Cardiology

## 2016-09-05 DIAGNOSIS — N183 Chronic kidney disease, stage 3 (moderate): Secondary | ICD-10-CM | POA: Diagnosis not present

## 2016-09-05 DIAGNOSIS — I2510 Atherosclerotic heart disease of native coronary artery without angina pectoris: Secondary | ICD-10-CM | POA: Diagnosis not present

## 2016-09-05 DIAGNOSIS — E1122 Type 2 diabetes mellitus with diabetic chronic kidney disease: Secondary | ICD-10-CM | POA: Diagnosis not present

## 2016-09-05 DIAGNOSIS — I5033 Acute on chronic diastolic (congestive) heart failure: Secondary | ICD-10-CM | POA: Diagnosis not present

## 2016-09-05 DIAGNOSIS — E1151 Type 2 diabetes mellitus with diabetic peripheral angiopathy without gangrene: Secondary | ICD-10-CM | POA: Diagnosis not present

## 2016-09-05 DIAGNOSIS — I130 Hypertensive heart and chronic kidney disease with heart failure and stage 1 through stage 4 chronic kidney disease, or unspecified chronic kidney disease: Secondary | ICD-10-CM | POA: Diagnosis not present

## 2016-09-06 DIAGNOSIS — I130 Hypertensive heart and chronic kidney disease with heart failure and stage 1 through stage 4 chronic kidney disease, or unspecified chronic kidney disease: Secondary | ICD-10-CM | POA: Diagnosis not present

## 2016-09-06 DIAGNOSIS — E1151 Type 2 diabetes mellitus with diabetic peripheral angiopathy without gangrene: Secondary | ICD-10-CM | POA: Diagnosis not present

## 2016-09-06 DIAGNOSIS — I2510 Atherosclerotic heart disease of native coronary artery without angina pectoris: Secondary | ICD-10-CM | POA: Diagnosis not present

## 2016-09-06 DIAGNOSIS — E1122 Type 2 diabetes mellitus with diabetic chronic kidney disease: Secondary | ICD-10-CM | POA: Diagnosis not present

## 2016-09-06 DIAGNOSIS — N183 Chronic kidney disease, stage 3 (moderate): Secondary | ICD-10-CM | POA: Diagnosis not present

## 2016-09-06 DIAGNOSIS — I5033 Acute on chronic diastolic (congestive) heart failure: Secondary | ICD-10-CM | POA: Diagnosis not present

## 2016-09-07 ENCOUNTER — Other Ambulatory Visit: Payer: Self-pay | Admitting: Cardiology

## 2016-09-07 DIAGNOSIS — I130 Hypertensive heart and chronic kidney disease with heart failure and stage 1 through stage 4 chronic kidney disease, or unspecified chronic kidney disease: Secondary | ICD-10-CM | POA: Diagnosis not present

## 2016-09-07 DIAGNOSIS — E1151 Type 2 diabetes mellitus with diabetic peripheral angiopathy without gangrene: Secondary | ICD-10-CM | POA: Diagnosis not present

## 2016-09-07 DIAGNOSIS — E1122 Type 2 diabetes mellitus with diabetic chronic kidney disease: Secondary | ICD-10-CM | POA: Diagnosis not present

## 2016-09-07 DIAGNOSIS — I5033 Acute on chronic diastolic (congestive) heart failure: Secondary | ICD-10-CM | POA: Diagnosis not present

## 2016-09-07 DIAGNOSIS — I2510 Atherosclerotic heart disease of native coronary artery without angina pectoris: Secondary | ICD-10-CM | POA: Diagnosis not present

## 2016-09-07 DIAGNOSIS — N183 Chronic kidney disease, stage 3 (moderate): Secondary | ICD-10-CM | POA: Diagnosis not present

## 2016-09-08 DIAGNOSIS — I130 Hypertensive heart and chronic kidney disease with heart failure and stage 1 through stage 4 chronic kidney disease, or unspecified chronic kidney disease: Secondary | ICD-10-CM | POA: Diagnosis not present

## 2016-09-08 DIAGNOSIS — I2510 Atherosclerotic heart disease of native coronary artery without angina pectoris: Secondary | ICD-10-CM | POA: Diagnosis not present

## 2016-09-08 DIAGNOSIS — E1122 Type 2 diabetes mellitus with diabetic chronic kidney disease: Secondary | ICD-10-CM | POA: Diagnosis not present

## 2016-09-08 DIAGNOSIS — I5033 Acute on chronic diastolic (congestive) heart failure: Secondary | ICD-10-CM | POA: Diagnosis not present

## 2016-09-08 DIAGNOSIS — E1151 Type 2 diabetes mellitus with diabetic peripheral angiopathy without gangrene: Secondary | ICD-10-CM | POA: Diagnosis not present

## 2016-09-08 DIAGNOSIS — N183 Chronic kidney disease, stage 3 (moderate): Secondary | ICD-10-CM | POA: Diagnosis not present

## 2016-09-11 DIAGNOSIS — E1151 Type 2 diabetes mellitus with diabetic peripheral angiopathy without gangrene: Secondary | ICD-10-CM | POA: Diagnosis not present

## 2016-09-11 DIAGNOSIS — N183 Chronic kidney disease, stage 3 (moderate): Secondary | ICD-10-CM | POA: Diagnosis not present

## 2016-09-11 DIAGNOSIS — I130 Hypertensive heart and chronic kidney disease with heart failure and stage 1 through stage 4 chronic kidney disease, or unspecified chronic kidney disease: Secondary | ICD-10-CM | POA: Diagnosis not present

## 2016-09-11 DIAGNOSIS — E1122 Type 2 diabetes mellitus with diabetic chronic kidney disease: Secondary | ICD-10-CM | POA: Diagnosis not present

## 2016-09-11 DIAGNOSIS — I5033 Acute on chronic diastolic (congestive) heart failure: Secondary | ICD-10-CM | POA: Diagnosis not present

## 2016-09-11 DIAGNOSIS — I2510 Atherosclerotic heart disease of native coronary artery without angina pectoris: Secondary | ICD-10-CM | POA: Diagnosis not present

## 2016-09-11 IMAGING — DX DG CHEST 2V
2 series · 2 of 2 positions shown · non-contrast
Comparison: Portable chest x-ray September 03, 2014

CLINICAL DATA: Shortness of breath, hypoxia, history of CHF, CABG,
and respiratory failure, history of previous heavy tobacco use.

EXAM:
CHEST  2 VIEW

[chest pa]
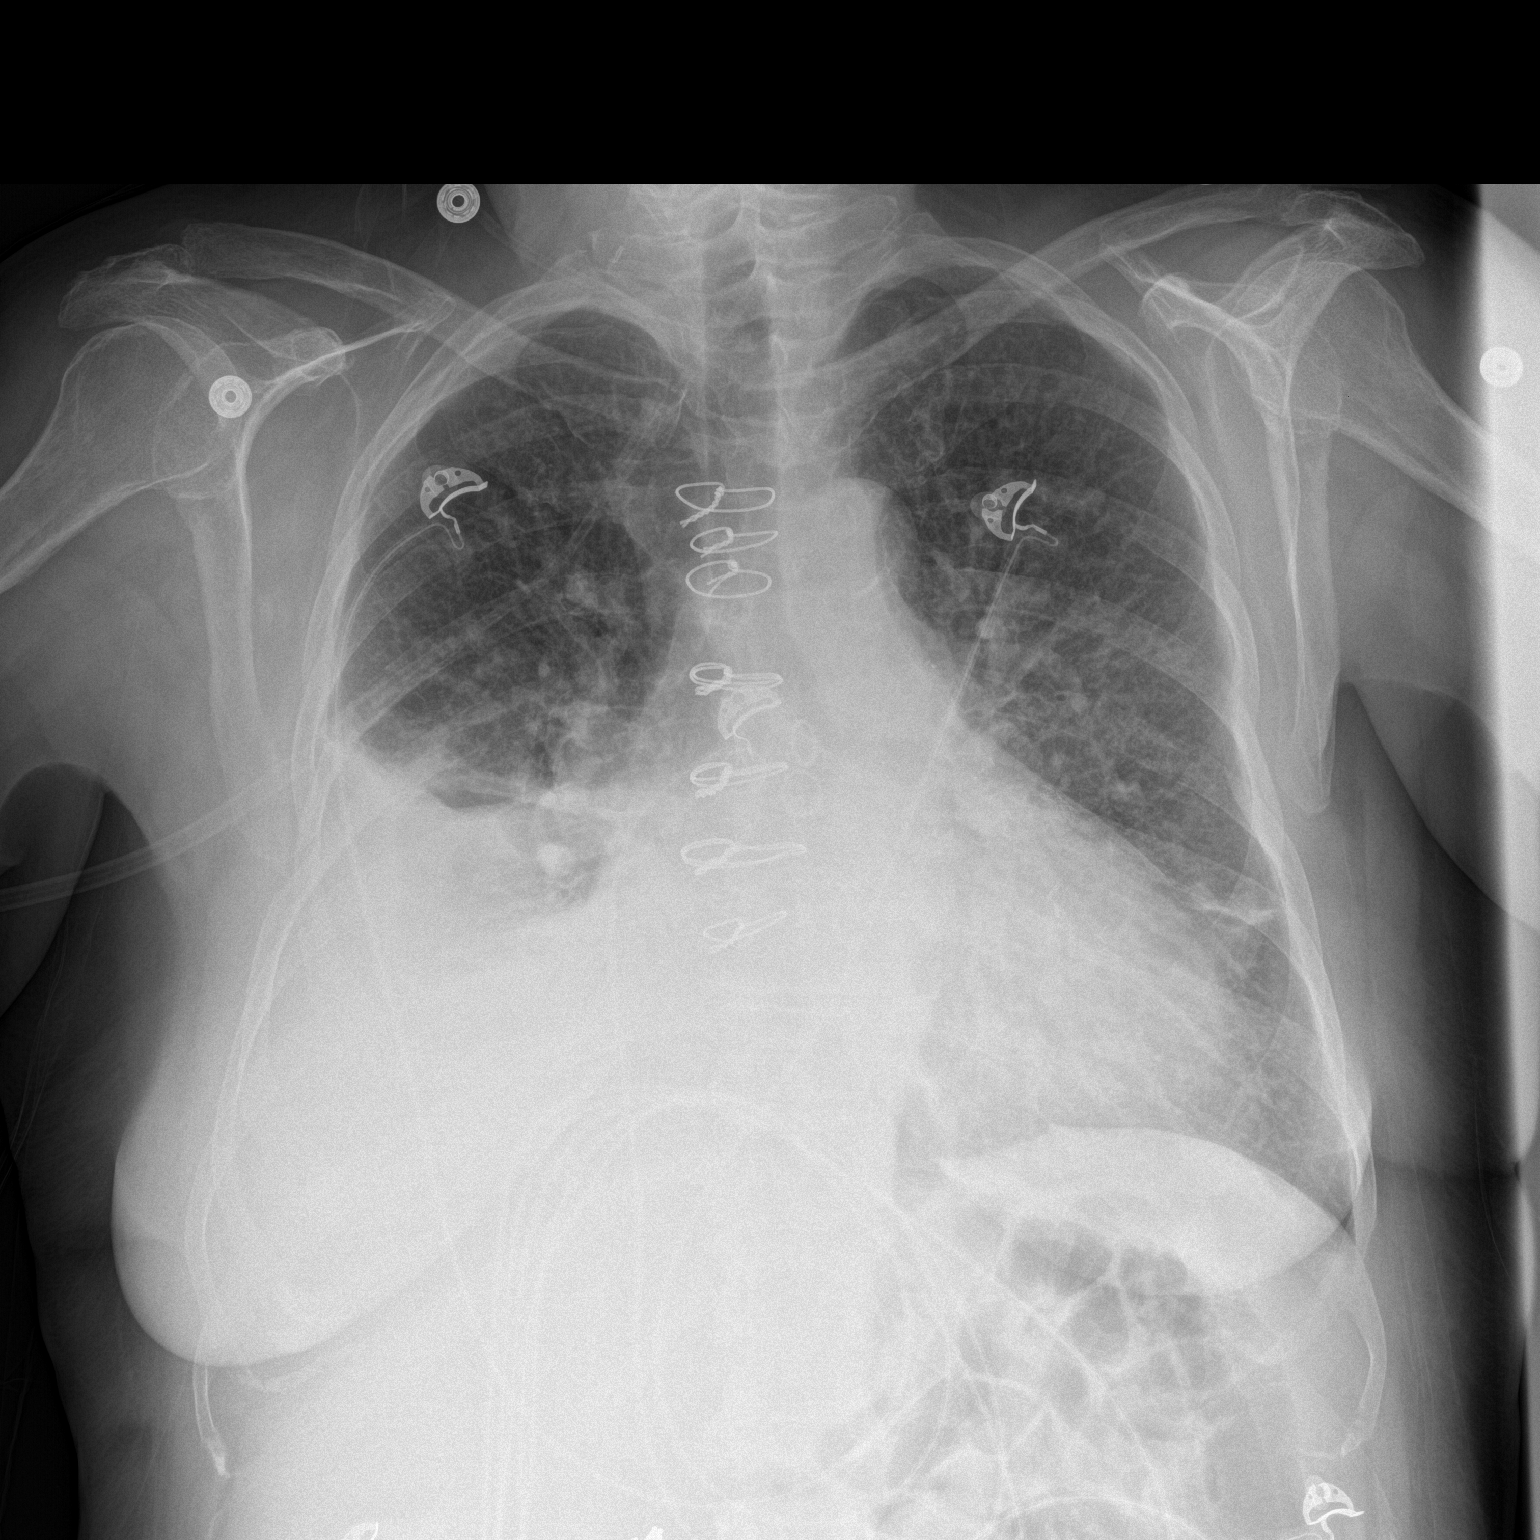

[chest lat]
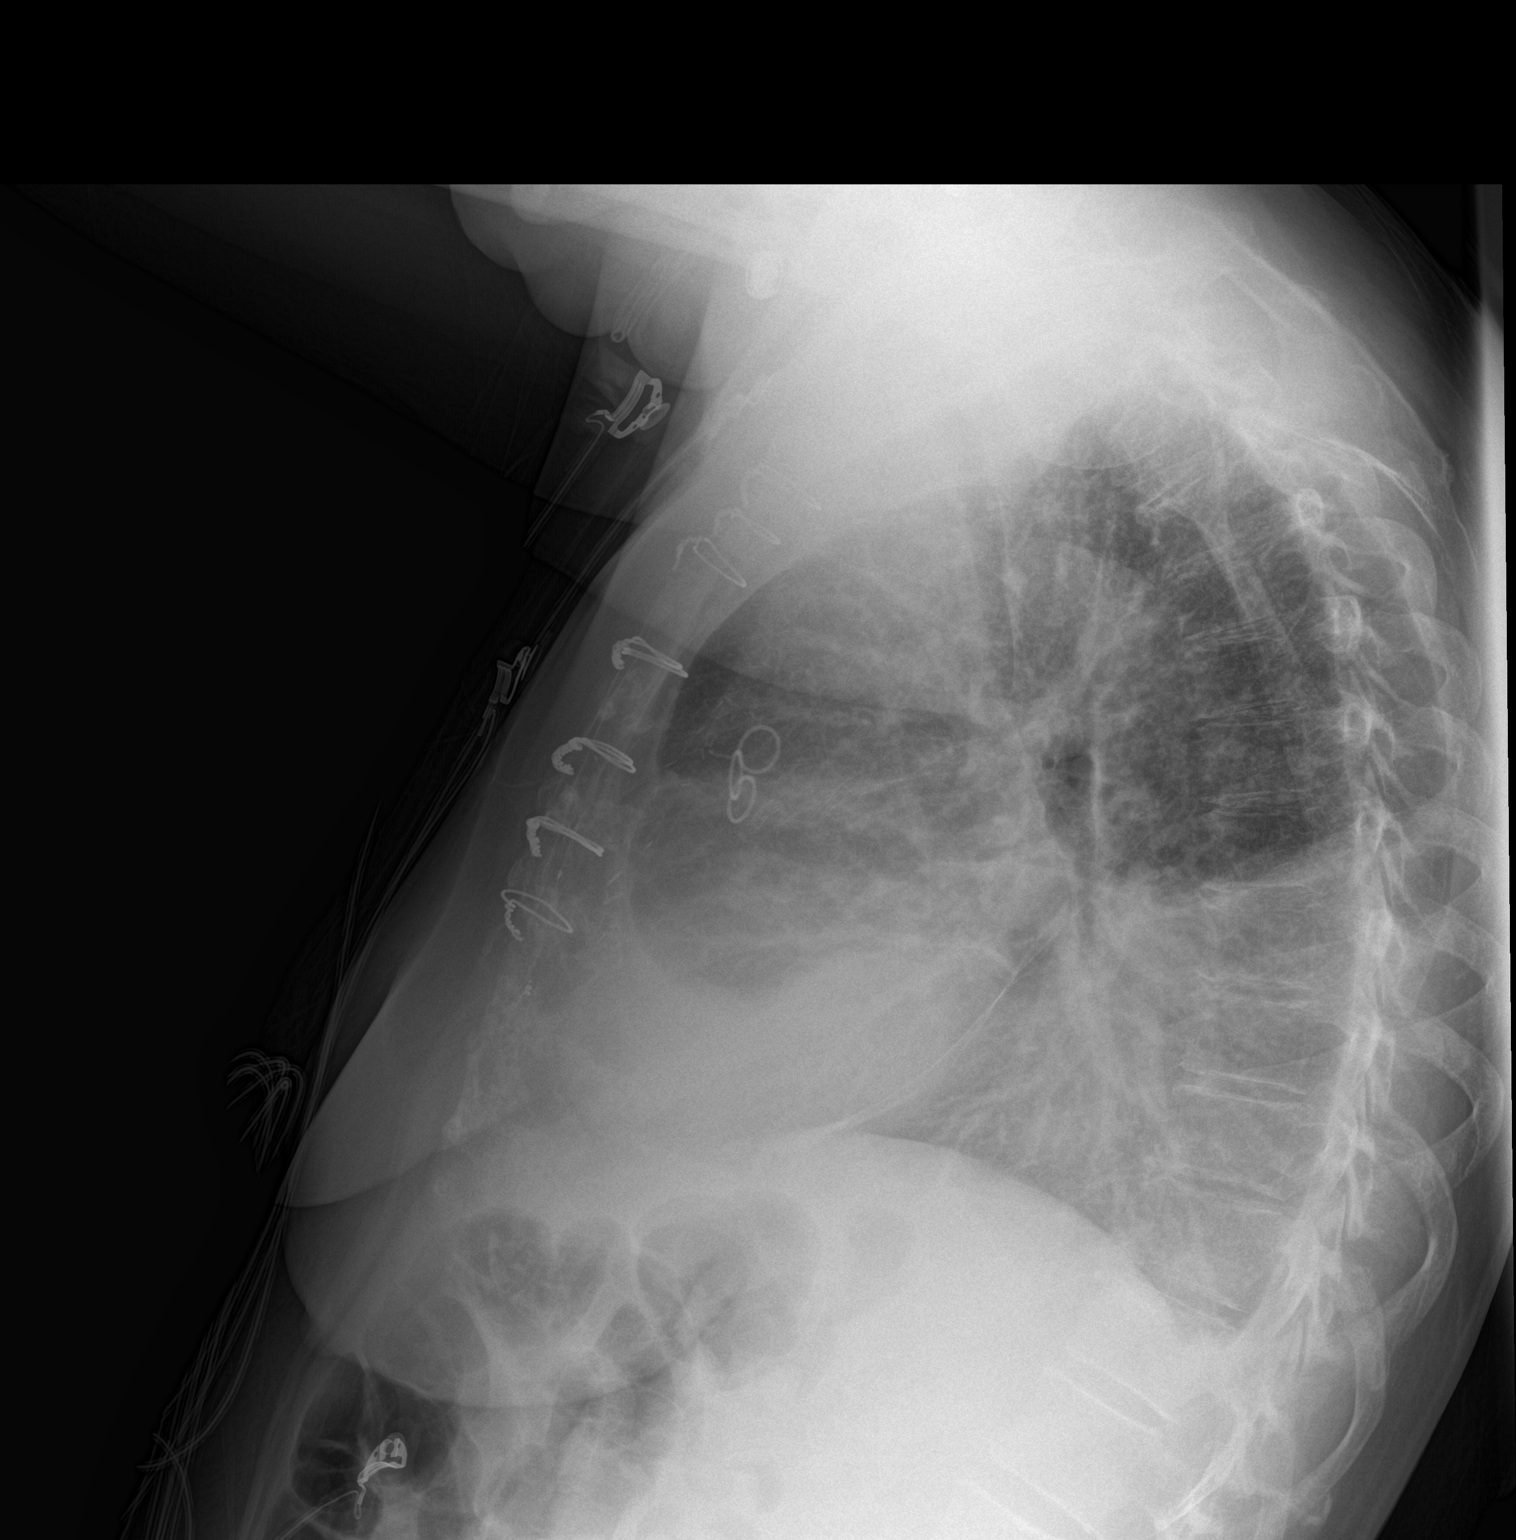

[2 of 2 positions shown; findings below may reference images not displayed]

FINDINGS: There is persistent moderate-sized right pleural effusion. Right
basilar atelectasis or pneumonia is suspected. The interstitial
markings of both lungs are increased. There is subsegmental
atelectasis in the lingula. No left pleural effusion is observed.
The cardiac silhouette is enlarged. There are post CABG changes. The
bony thorax exhibits no acute abnormality.
IMPRESSION: CHF with mild pulmonary interstitial edema and moderate-sized
right-sided pleural effusion. Underlying atelectasis or pneumonia in
the lower right lung is suspected. Continued follow-up radiographs
are recommended to assure positive response to therapy.

## 2016-09-12 ENCOUNTER — Other Ambulatory Visit: Payer: Self-pay | Admitting: Cardiology

## 2016-09-12 DIAGNOSIS — N183 Chronic kidney disease, stage 3 (moderate): Secondary | ICD-10-CM | POA: Diagnosis not present

## 2016-09-12 DIAGNOSIS — I2510 Atherosclerotic heart disease of native coronary artery without angina pectoris: Secondary | ICD-10-CM | POA: Diagnosis not present

## 2016-09-12 DIAGNOSIS — I130 Hypertensive heart and chronic kidney disease with heart failure and stage 1 through stage 4 chronic kidney disease, or unspecified chronic kidney disease: Secondary | ICD-10-CM | POA: Diagnosis not present

## 2016-09-12 DIAGNOSIS — E1122 Type 2 diabetes mellitus with diabetic chronic kidney disease: Secondary | ICD-10-CM | POA: Diagnosis not present

## 2016-09-12 DIAGNOSIS — I5033 Acute on chronic diastolic (congestive) heart failure: Secondary | ICD-10-CM | POA: Diagnosis not present

## 2016-09-12 DIAGNOSIS — E1151 Type 2 diabetes mellitus with diabetic peripheral angiopathy without gangrene: Secondary | ICD-10-CM | POA: Diagnosis not present

## 2016-09-13 DIAGNOSIS — E1122 Type 2 diabetes mellitus with diabetic chronic kidney disease: Secondary | ICD-10-CM | POA: Diagnosis not present

## 2016-09-13 DIAGNOSIS — N183 Chronic kidney disease, stage 3 (moderate): Secondary | ICD-10-CM | POA: Diagnosis not present

## 2016-09-13 DIAGNOSIS — I5033 Acute on chronic diastolic (congestive) heart failure: Secondary | ICD-10-CM | POA: Diagnosis not present

## 2016-09-13 DIAGNOSIS — I130 Hypertensive heart and chronic kidney disease with heart failure and stage 1 through stage 4 chronic kidney disease, or unspecified chronic kidney disease: Secondary | ICD-10-CM | POA: Diagnosis not present

## 2016-09-13 DIAGNOSIS — E1151 Type 2 diabetes mellitus with diabetic peripheral angiopathy without gangrene: Secondary | ICD-10-CM | POA: Diagnosis not present

## 2016-09-13 DIAGNOSIS — I2510 Atherosclerotic heart disease of native coronary artery without angina pectoris: Secondary | ICD-10-CM | POA: Diagnosis not present

## 2016-09-13 IMAGING — DX DG CHEST 1V
1 series · 1 of 1 positions shown · non-contrast
Comparison: 1 day prior

CLINICAL DATA: Status post right-sided thoracentesis.

EXAM:
CHEST  1 VIEW

[w chest pa]
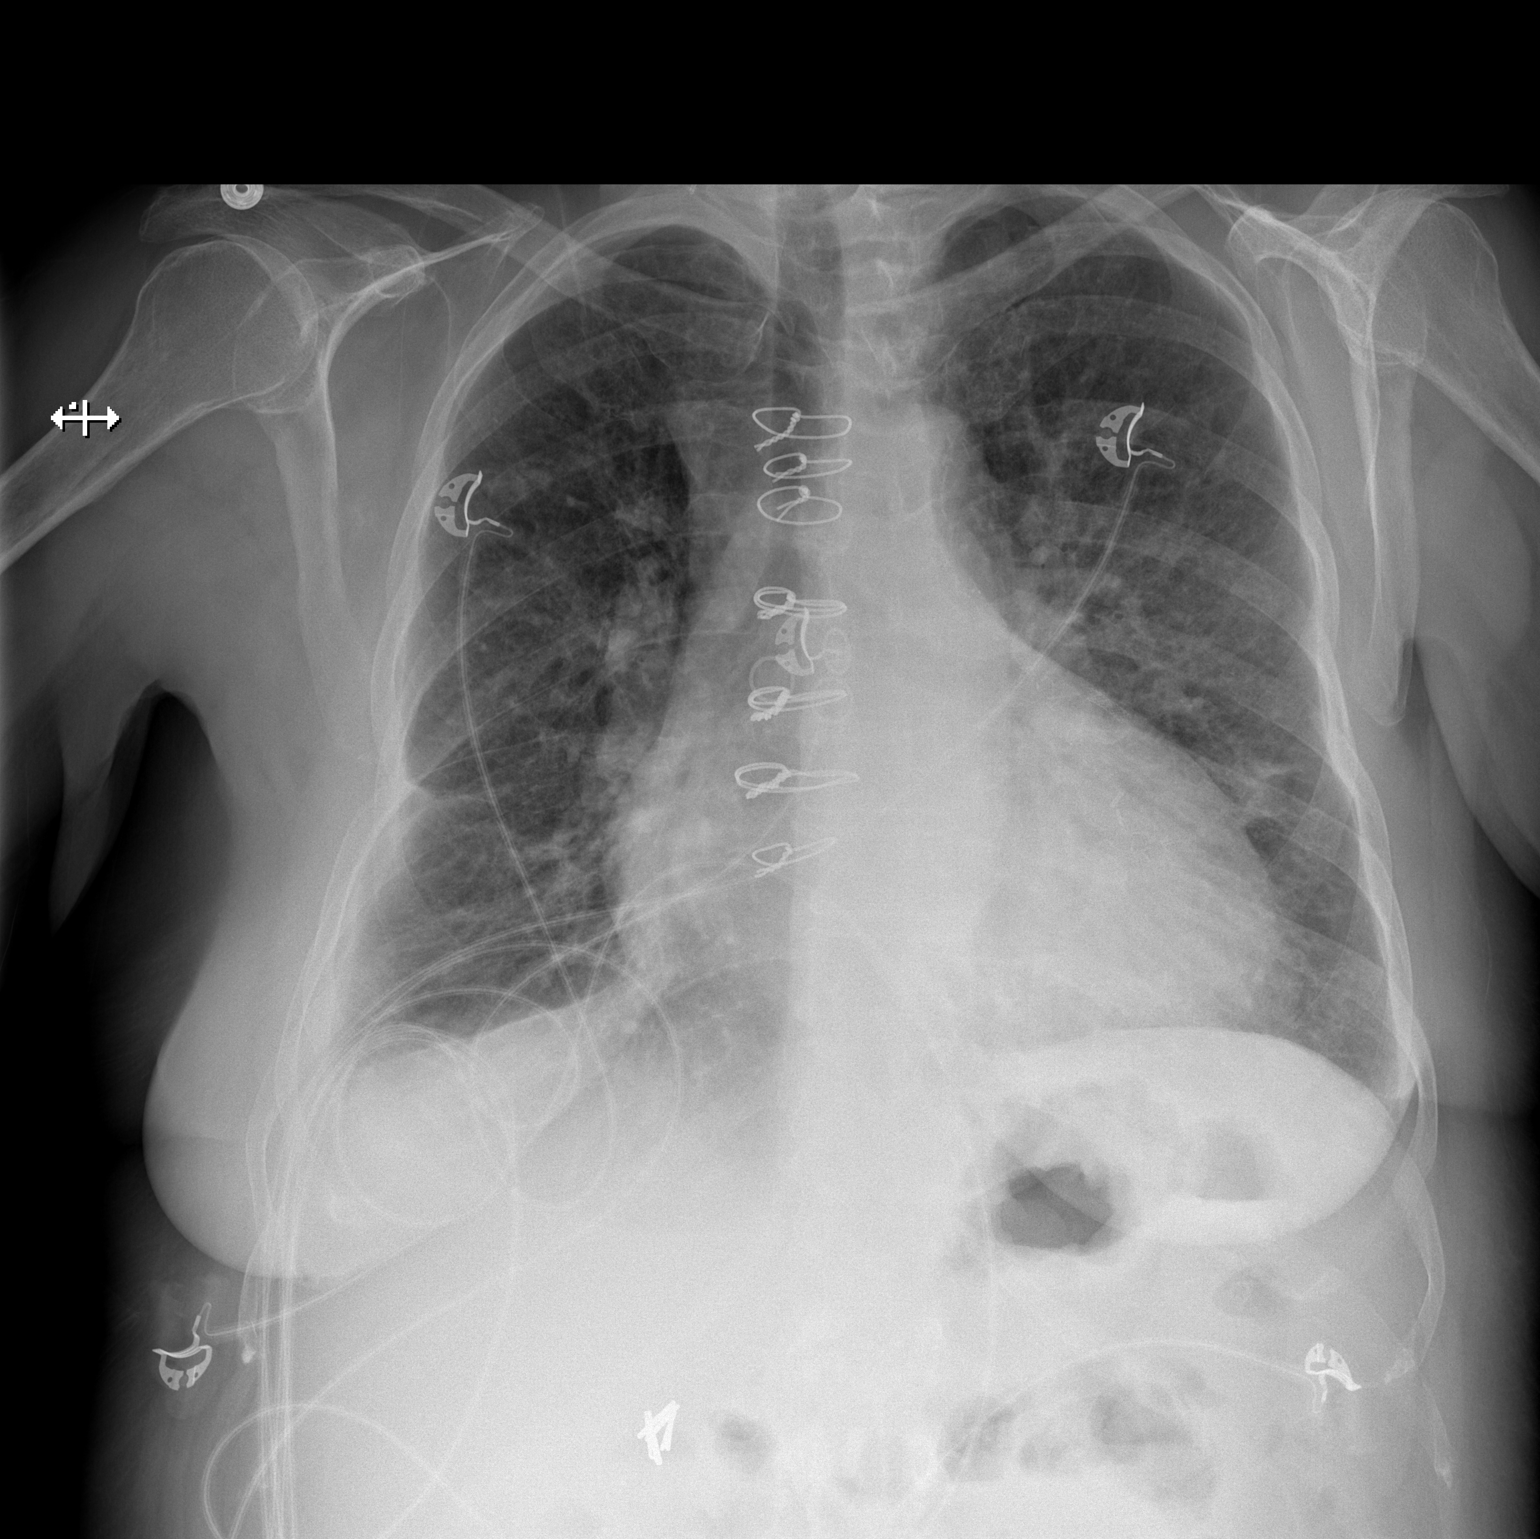

[1 of 1 positions shown; findings below may reference images not displayed]

FINDINGS: Patient rotated right. Prior median sternotomy. Midline trachea.
Mild cardiomegaly. Resolution of right pleural effusion. No apical
pneumothorax. Right pleural thickening inferiorly. Cannot exclude
trace air inferior and laterally. No left-sided pleural effusion.
Interstitial edema is decreased, mild. No lobar consolidation.
IMPRESSION: Status post right-sided thoracentesis with resolution of right
pleural effusion. No apical pneumothorax. Cannot exclude trace
inferior lateral right pleural air. Recommend attention on
follow-up.

Improved interstitial edema.

## 2016-09-14 DIAGNOSIS — N183 Chronic kidney disease, stage 3 (moderate): Secondary | ICD-10-CM | POA: Diagnosis not present

## 2016-09-14 DIAGNOSIS — E1151 Type 2 diabetes mellitus with diabetic peripheral angiopathy without gangrene: Secondary | ICD-10-CM | POA: Diagnosis not present

## 2016-09-14 DIAGNOSIS — E1122 Type 2 diabetes mellitus with diabetic chronic kidney disease: Secondary | ICD-10-CM | POA: Diagnosis not present

## 2016-09-14 DIAGNOSIS — I5033 Acute on chronic diastolic (congestive) heart failure: Secondary | ICD-10-CM | POA: Diagnosis not present

## 2016-09-14 DIAGNOSIS — I2510 Atherosclerotic heart disease of native coronary artery without angina pectoris: Secondary | ICD-10-CM | POA: Diagnosis not present

## 2016-09-14 DIAGNOSIS — I130 Hypertensive heart and chronic kidney disease with heart failure and stage 1 through stage 4 chronic kidney disease, or unspecified chronic kidney disease: Secondary | ICD-10-CM | POA: Diagnosis not present

## 2016-09-15 DIAGNOSIS — N183 Chronic kidney disease, stage 3 (moderate): Secondary | ICD-10-CM | POA: Diagnosis not present

## 2016-09-15 DIAGNOSIS — I130 Hypertensive heart and chronic kidney disease with heart failure and stage 1 through stage 4 chronic kidney disease, or unspecified chronic kidney disease: Secondary | ICD-10-CM | POA: Diagnosis not present

## 2016-09-15 DIAGNOSIS — I5033 Acute on chronic diastolic (congestive) heart failure: Secondary | ICD-10-CM | POA: Diagnosis not present

## 2016-09-15 DIAGNOSIS — I2510 Atherosclerotic heart disease of native coronary artery without angina pectoris: Secondary | ICD-10-CM | POA: Diagnosis not present

## 2016-09-15 DIAGNOSIS — E1122 Type 2 diabetes mellitus with diabetic chronic kidney disease: Secondary | ICD-10-CM | POA: Diagnosis not present

## 2016-09-15 DIAGNOSIS — E1151 Type 2 diabetes mellitus with diabetic peripheral angiopathy without gangrene: Secondary | ICD-10-CM | POA: Diagnosis not present

## 2016-09-18 DIAGNOSIS — I2510 Atherosclerotic heart disease of native coronary artery without angina pectoris: Secondary | ICD-10-CM | POA: Diagnosis not present

## 2016-09-18 DIAGNOSIS — I5033 Acute on chronic diastolic (congestive) heart failure: Secondary | ICD-10-CM | POA: Diagnosis not present

## 2016-09-18 DIAGNOSIS — N183 Chronic kidney disease, stage 3 (moderate): Secondary | ICD-10-CM | POA: Diagnosis not present

## 2016-09-18 DIAGNOSIS — E1151 Type 2 diabetes mellitus with diabetic peripheral angiopathy without gangrene: Secondary | ICD-10-CM | POA: Diagnosis not present

## 2016-09-18 DIAGNOSIS — I130 Hypertensive heart and chronic kidney disease with heart failure and stage 1 through stage 4 chronic kidney disease, or unspecified chronic kidney disease: Secondary | ICD-10-CM | POA: Diagnosis not present

## 2016-09-18 DIAGNOSIS — E1122 Type 2 diabetes mellitus with diabetic chronic kidney disease: Secondary | ICD-10-CM | POA: Diagnosis not present

## 2016-09-19 DIAGNOSIS — E1151 Type 2 diabetes mellitus with diabetic peripheral angiopathy without gangrene: Secondary | ICD-10-CM | POA: Diagnosis not present

## 2016-09-19 DIAGNOSIS — I130 Hypertensive heart and chronic kidney disease with heart failure and stage 1 through stage 4 chronic kidney disease, or unspecified chronic kidney disease: Secondary | ICD-10-CM | POA: Diagnosis not present

## 2016-09-19 DIAGNOSIS — E1122 Type 2 diabetes mellitus with diabetic chronic kidney disease: Secondary | ICD-10-CM | POA: Diagnosis not present

## 2016-09-19 DIAGNOSIS — N183 Chronic kidney disease, stage 3 (moderate): Secondary | ICD-10-CM | POA: Diagnosis not present

## 2016-09-19 DIAGNOSIS — I5033 Acute on chronic diastolic (congestive) heart failure: Secondary | ICD-10-CM | POA: Diagnosis not present

## 2016-09-19 DIAGNOSIS — I2510 Atherosclerotic heart disease of native coronary artery without angina pectoris: Secondary | ICD-10-CM | POA: Diagnosis not present

## 2016-09-20 DIAGNOSIS — I5033 Acute on chronic diastolic (congestive) heart failure: Secondary | ICD-10-CM | POA: Diagnosis not present

## 2016-09-20 DIAGNOSIS — E1151 Type 2 diabetes mellitus with diabetic peripheral angiopathy without gangrene: Secondary | ICD-10-CM | POA: Diagnosis not present

## 2016-09-20 DIAGNOSIS — E1122 Type 2 diabetes mellitus with diabetic chronic kidney disease: Secondary | ICD-10-CM | POA: Diagnosis not present

## 2016-09-20 DIAGNOSIS — I2510 Atherosclerotic heart disease of native coronary artery without angina pectoris: Secondary | ICD-10-CM | POA: Diagnosis not present

## 2016-09-20 DIAGNOSIS — N183 Chronic kidney disease, stage 3 (moderate): Secondary | ICD-10-CM | POA: Diagnosis not present

## 2016-09-20 DIAGNOSIS — I130 Hypertensive heart and chronic kidney disease with heart failure and stage 1 through stage 4 chronic kidney disease, or unspecified chronic kidney disease: Secondary | ICD-10-CM | POA: Diagnosis not present

## 2016-09-21 DIAGNOSIS — E1151 Type 2 diabetes mellitus with diabetic peripheral angiopathy without gangrene: Secondary | ICD-10-CM | POA: Diagnosis not present

## 2016-09-21 DIAGNOSIS — I2510 Atherosclerotic heart disease of native coronary artery without angina pectoris: Secondary | ICD-10-CM | POA: Diagnosis not present

## 2016-09-21 DIAGNOSIS — I5033 Acute on chronic diastolic (congestive) heart failure: Secondary | ICD-10-CM | POA: Diagnosis not present

## 2016-09-21 DIAGNOSIS — N183 Chronic kidney disease, stage 3 (moderate): Secondary | ICD-10-CM | POA: Diagnosis not present

## 2016-09-21 DIAGNOSIS — I130 Hypertensive heart and chronic kidney disease with heart failure and stage 1 through stage 4 chronic kidney disease, or unspecified chronic kidney disease: Secondary | ICD-10-CM | POA: Diagnosis not present

## 2016-09-21 DIAGNOSIS — E1122 Type 2 diabetes mellitus with diabetic chronic kidney disease: Secondary | ICD-10-CM | POA: Diagnosis not present

## 2016-09-22 DIAGNOSIS — E1151 Type 2 diabetes mellitus with diabetic peripheral angiopathy without gangrene: Secondary | ICD-10-CM | POA: Diagnosis not present

## 2016-09-22 DIAGNOSIS — I2510 Atherosclerotic heart disease of native coronary artery without angina pectoris: Secondary | ICD-10-CM | POA: Diagnosis not present

## 2016-09-22 DIAGNOSIS — E1122 Type 2 diabetes mellitus with diabetic chronic kidney disease: Secondary | ICD-10-CM | POA: Diagnosis not present

## 2016-09-22 DIAGNOSIS — I130 Hypertensive heart and chronic kidney disease with heart failure and stage 1 through stage 4 chronic kidney disease, or unspecified chronic kidney disease: Secondary | ICD-10-CM | POA: Diagnosis not present

## 2016-09-22 DIAGNOSIS — I5033 Acute on chronic diastolic (congestive) heart failure: Secondary | ICD-10-CM | POA: Diagnosis not present

## 2016-09-22 DIAGNOSIS — N183 Chronic kidney disease, stage 3 (moderate): Secondary | ICD-10-CM | POA: Diagnosis not present

## 2016-09-24 NOTE — Progress Notes (Addendum)
Cardiology Office Note   Date:  09/25/2016   ID:  [name] [name], DOB 1945-11-27, MRN [id number]  PCP:  Glenda Chroman, MD  Cardiologist:  Domenic Polite Advanced Heart Failure Clinic: [facility] Complaint  Patient presents with  . Congestive Heart Failure  . Coronary Artery Disease  . Hospitalization Follow-up      History of Present Illness: [name] [name] is a 71 y.o. female who presents for ongoing assessment and management post hospitalization, after admission for acute on chronic diastolic CHF, echo revealing EF of 55%-60%, HTN, CAD s/p CABG, PAD, and CKD Stage III. She was diuresed with 11.6 liters output. She was seen by Dr. Aundra Dubin and continued on torsemide 60 mg TID. She also has limited mobility due to chronic hip pain. She was continued on antihypertensive medications.   She comes today with her daughter. She is without complaints.She has moved within walking distance to her daughter;s home since discharge. She is compliant with daily wts and medications. Her daughter does the grocery shopping and cooks her meals for her.   She complains of dependent edema and is also having some issues with walking. She has had PT at home, but now continues this on her own. She uses a rolling walker for ambulation. Weight has been stable.   Past Medical History:  Diagnosis Date  . Arthritis   . Carotid artery disease (HCC)    a. Bilateral CEA; 50-60% bilateral ICA stenosis, 11/12  . Cellulitis    a. Recurrent, bilateral legs  . CHF (congestive heart failure) ([place])   . Chronic back pain   . Chronic diastolic heart failure ([place])   . Coronary atherosclerosis of native coronary artery    a. 11/2010 NSTEMI/CABG x 4: L-LAD; S-PL; S-OM; S-DX, by PVT.  [name] Critical lower limb ischemia   . Depression   . Essential hypertension, benign   . Herniated disc   . History of blood transfusion   . Hypothyroidism   . Ischemic cardiomyopathy    a. 11/2010 TEE: EF 40-45%.  . Migraine   . Mixed  hyperlipidemia   . NSTEMI (non-ST elevated myocardial infarction) ([place]) 11/2010  . On home oxygen therapy   . PAD (peripheral artery disease) ([place])   . Pleural effusion   . Pneumonia 2000's X 2  . Stroke ([place]) 08/2009  . Suicide attempt ([place]) 08/2002  . TIA (transient ischemic attack)   . TMJ syndrome   . Type 2 diabetes mellitus ([place])   . Venous insufficiency     Past Surgical History:  Procedure Laterality Date  . ABDOMINAL HYSTERECTOMY    . APPENDECTOMY    . CARDIAC CATHETERIZATION  11/2010  . CARDIAC CATHETERIZATION N/A 09/16/2015   Procedure: Right Heart Cath;  Surgeon: Larey Dresser, MD;  Location: [place] [facility];  Service: Cardiovascular;  Laterality: N/A;  . CAROTID ENDARTERECTOMY Bilateral 2011   Bilateral - Dr. Kellie Simmering  . CHOLECYSTECTOMY    . COLONOSCOPY W/ BIOPSIES AND POLYPECTOMY    . CORONARY ANGIOPLASTY WITH STENT PLACEMENT  03/2013   "1"  . CORONARY ARTERY BYPASS GRAFT  11/24/2010   Procedure: CORONARY ARTERY BYPASS GRAFTING (CABG);  Surgeon: Tharon Aquas Adelene Idler, MD;  Location: [place];  Service: Open Heart Surgery;  Laterality: N/A;  Coronary Artery Bypass Graft times four on pump utilizing left internal mammary artery and bilateral greater saphenous veins harvested endoscopically, transesophageal echocardiogram   . DEBRIDEMENT TOE Left    "nonhealing wound; 3rd digit  . DILATION  AND CURETTAGE OF UTERUS    . ENDARTERECTOMY POPLITEAL Left 10/07/2013   Procedure: LEFT POPLITEAL ENDARTERECTOMY ;  Surgeon: Angelia Mould, MD;  Location: [facility];  Service: Vascular;  Laterality: Left;  . FEMORAL-POPLITEAL BYPASS GRAFT Left 10/07/2013   Procedure: LEFT FEMORAL-POPLITEAL ARTERY BYPASS GRAFT;  Surgeon: Angelia Mould, MD;  Location: [place];  Service: Vascular;  Laterality: Left;  . FRACTURE SURGERY    . INTRAOPERATIVE ARTERIOGRAM Left 10/07/2013   Procedure: INTRA OPERATIVE ARTERIOGRAM LEFT LEG;  Surgeon: Angelia Mould, MD;  Location: [place];  Service:  Vascular;  Laterality: Left;  . IR GENERIC HISTORICAL  09/21/2015   IR REMOVAL OF PLURAL CATH W/CUFF 09/21/2015 Marybelle Killings, MD [facility]-INTERV RAD  . LEFT AND RIGHT HEART CATHETERIZATION WITH CORONARY/GRAFT ANGIOGRAM N/A 03/27/2013   Procedure: LEFT AND RIGHT HEART CATHETERIZATION WITH Beatrix Fetters;  Surgeon: Burnell Blanks, MD;  Location: [facility] CATH LAB;  Service: Cardiovascular;  Laterality: N/A;  . LOWER EXTREMITY ANGIOGRAM  09/08/2012; 10/06/2013   "found 100% blockage; unsuccessful attempt at crossing a chronic total occlusion of the left SFA in the setting of critical limb ischemia  . LOWER EXTREMITY ANGIOGRAM Bilateral 09/08/2013   Procedure: LOWER EXTREMITY ANGIOGRAM;  Surgeon: [name], MD;  Location: [facility] CATH LAB;  Service: Cardiovascular;  Laterality: Bilateral;  . PERIPHERAL VASCULAR CATHETERIZATION  11/04/2015   Procedure: Peripheral Vascular Atherectomy;  Surgeon: [name], MD;  Location: [place] [facility];  Service: Cardiovascular;;  RT. SFA  . PERIPHERAL VASCULAR CATHETERIZATION Bilateral 11/04/2015   Procedure: Lower Extremity Intervention;  Surgeon: [name], MD;  Location: [place] [facility];  Service: Cardiovascular;  Laterality: Bilateral;  . PERIPHERAL VASCULAR CATHETERIZATION  11/04/2015   Procedure: Peripheral Vascular Balloon Angioplasty;  Surgeon: [name], MD;  Location: [place] [facility];  Service: Cardiovascular;;  TP Trunk  . TOE SURGERY Left    "put pin in 2nd toe"  . TUBAL LIGATION    . WRIST FRACTURE SURGERY Left    "grafted bone from hip to wrist"     Current Outpatient Prescriptions  Medication Sig Dispense Refill  . acetaminophen (TYLENOL) 500 MG tablet Take 500-1,000 mg by mouth every 6 (six) hours as needed for mild pain or moderate pain.     [name] albuterol (PROVENTIL HFA;VENTOLIN HFA) 108 (90 BASE) MCG/ACT inhaler Inhale 2 puffs into the lungs daily as needed for wheezing or shortness of breath.     [name] amLODipine  (NORVASC) 10 MG tablet Take 1 tablet (10 mg total) by mouth daily. 90 tablet 3  . aspirin 81 MG EC tablet Take 81 mg by mouth daily.    [name] atorvastatin (LIPITOR) 40 MG tablet Take 1 tablet (40 mg total) by mouth daily at 6 PM. 90 tablet 3  . citalopram (CELEXA) 40 MG tablet Take 40 mg by mouth every morning.     . clopidogrel (PLAVIX) 75 MG tablet TAKE ONE TABLET BY MOUTH ONCE DAILY WITH  BREAKFAST 90 tablet 3  . clotrimazole-betamethasone (LOTRISONE) cream Apply 1 application topically 2 (two) times daily as needed.    Mariane Baumgarten Calcium (STOOL SOFTENER PO) Take 200 mg by mouth at bedtime as needed (for constipation).     . gabapentin (NEURONTIN) 300 MG capsule Take 300-600 mg by mouth See admin instructions. 300 mg in the morning and 600 mg prior to bed(time)    . HUMALOG KWIKPEN 100 UNIT/ML KiwkPen Inject 3-7 Units into the muscle 3 (three) times daily.    [name]  hydrALAZINE (APRESOLINE) 25 MG tablet Take 3 tablets (75 mg total) by mouth every 8 (eight) hours. 270 tablet 3  . insulin detemir (LEVEMIR) 100 UNIT/ML injection Inject 7 Units into the skin at bedtime.    . isosorbide mononitrate (IMDUR) 30 MG 24 hr tablet Take 1 tablet (30 mg total) by mouth daily. 90 tablet 3  . levothyroxine (SYNTHROID, LEVOTHROID) 75 MCG tablet Take 1 tablet (75 mcg total) by mouth daily before breakfast. 30 tablet 3  . Multiple Vitamin (MULTIVITAMIN WITH MINERALS) TABS tablet Take 1 tablet by mouth daily.    . nitroGLYCERIN (NITROSTAT) 0.4 MG SL tablet Place 1 tablet (0.4 mg total) under the tongue every 5 (five) minutes as needed for chest pain. Up to 3 doses. If no relief after 3rd dose, proceed to the ED for an evaluation 25 tablet 3  . nystatin (MYCOSTATIN/NYSTOP) powder Apply 1 application topically 2 (two) times daily as needed.    . potassium chloride SA (K-DUR,KLOR-CON) 20 MEQ tablet Take 1 tablet (20 mEq total) by mouth 2 (two) times daily. 180 tablet 3  . spironolactone (ALDACTONE) 25 MG tablet Take 0.5  tablets (12.5 mg total) by mouth daily. 45 tablet 3  . tiZANidine (ZANAFLEX) 4 MG tablet Take 4 mg by mouth 2 (two) times daily as needed for muscle spasms.    [name] torsemide (DEMADEX) 20 MG tablet Take 3 tablets (60 mg total) by mouth 2 (two) times daily. 180 tablet 10  . traMADol (ULTRAM) 50 MG tablet Take 50 mg by mouth.    . umeclidinium-vilanterol (ANORO ELLIPTA) 62.5-25 MCG/INH AEPB Inhale 1 puff into the lungs daily.    [name] losartan (COZAAR) 25 MG tablet Take 0.5 tablets (12.5 mg total) by mouth daily. 45 tablet 3   No current facility-administered medications for this visit.     Allergies:   Other; Strawberry extract; Sulfa antibiotics; Coffee bean extract [coffea arabica]; and Tape    Social History:  The patient  reports that she quit smoking about 6 years ago. Her smoking use included Cigarettes. She started smoking about 60 years ago. She has a 150.00 pack-year smoking history. She has never used smokeless tobacco. She reports that she drinks alcohol. She reports that she uses drugs, including Marijuana.   Family History:  The patient's family history includes Cancer in her brother and sister; Coronary artery disease in her father; Diabetes in her daughter, mother, and son; Diabetes type II in her mother; Heart attack in her father; Heart disease in her father and mother; Heart failure in her sister; Hyperlipidemia in her daughter, mother, and son; Hypertension in her mother.    ROS: All other systems are reviewed and negative. Unless otherwise mentioned in H&P    PHYSICAL EXAM: VS:  BP (!) 126/58   Pulse (!) 59   Ht _0  (1.676 m)   Wt 200 lb 12.8 oz (91.1 kg)   SpO2 90%   BMI 32.41 kg/m  , BMI Body mass index is 32.41 kg/m. GEN: Well nourished, well developed, in no acute distress  HEENT: normal  Neck: no JVD, carotid bruits, or masses Cardiac: RRR; 2/6 systolic murmur,  Respiratory:  Some bibasilar crackles are noted. She is wearing O2. GI: soft, nontender, nondistended,  + BS MS: no deformity or atrophy LE with 1+ dependent edema to just above the ankles. Venous stasis skin discolorations are noted. She has some dressings on her ankles bilaterally.  Skin: warm and dry, no rash Neuro:  Strength and sensation are  overall diminished.  Psych: euthymic mood, full affect   Recent Labs: 07/28/2016: ALT 15; B Natriuretic Peptide 643.2; TSH 1.323 07/31/2016: Magnesium 2.3 08/03/2016: Hemoglobin 10.1; Platelets 176 08/17/2016: BUN 81; Creatinine, Ser 2.17; Potassium 6.2; Sodium 135    Lipid Panel    Component Value Date/Time   CHOL 78 11/15/2015 1114   TRIG 102 11/15/2015 1114   HDL 22 (L) 11/15/2015 1114   CHOLHDL 3.5 11/15/2015 1114   VLDL 20 11/15/2015 1114   LDLCALC 36 11/15/2015 1114      Wt Readings from Last 3 Encounters:  09/25/16 200 lb 12.8 oz (91.1 kg)  08/17/16 188 lb 3.2 oz (85.4 kg)  08/03/16 193 lb 3.2 oz (87.6 kg)      Other studies Reviewed: Echocardiogram 07/05/2016 Left ventricle: The cavity size was normal. Wall thickness was   increased in a pattern of mild LVH. Systolic function was normal.   The estimated ejection fraction was in the range of 55% to 60%.   Wall motion was normal; there were no regional wall motion   abnormalities. Features are consistent with a pseudonormal left   ventricular filling pattern, with concomitant abnormal relaxation   and increased filling pressure (grade 2 diastolic dysfunction). - Ventricular septum: The contour showed mild systolic flattening. - Left atrium: The atrium was moderately dilated. - Right ventricle: The cavity size was mildly dilated. - Right atrium: The atrium was moderately dilated. - Tricuspid valve: There was moderate regurgitation. - Pulmonary arteries: Systolic pressure was severely increased. PA   peak pressure: 84 mm Hg (S).  ASSESSMENT AND PLAN:  1. Chronic Diastolic CHF: She is followed by the Advanced Heart Failure clinic, and is due to see Dr. Aundra Dubin in 3 weeks. Dry wt  appears to be 193 lbs. Today she is 200 lbs, but is asymptomatic. She has not taken diuretic today yet. She is to continue daily weights and salt avoidance. RHC did reveal mild PAH, PA 73/26 mean 42. PVR 3.0 WU. Wt is up 7 lbs since last office visit. She is eating better, and there is no evidence of volume overload presently.   2. Hypertension: BP is well controlled on multiple medications to include amlodipine, hydralazine, losartan, spironolactone,  isosorbide. Will not repeat echo at this time.   3. CKD Stage III:  Repeat labs in anticipation of follow up with AHF team. Baseline creatinine 1.5-1.7.   4. Hx of PAD: Hx of right leg arterectomy Continue ASA and Plavix. Would repeat CBC in anticipation of  next AHF office visit.   Current medicines are reviewed at length with the patient today.    Labs/ tests ordered today include:  Phill Myron. [place] Pugh, ANP, AACC   09/25/2016 3:55 PM    [facility] [address] [place], [place] [address] Phone: [phone]; Fax: [phone]

## 2016-09-25 ENCOUNTER — Encounter: Payer: Self-pay | Admitting: Adult Health

## 2016-09-25 ENCOUNTER — Ambulatory Visit (INDEPENDENT_AMBULATORY_CARE_PROVIDER_SITE_OTHER): Payer: Medicare Other | Admitting: Adult Health

## 2016-09-25 VITALS — BP 126/58 | HR 59 | Ht 66.0 in | Wt 200.8 lb

## 2016-09-25 DIAGNOSIS — I2510 Atherosclerotic heart disease of native coronary artery without angina pectoris: Secondary | ICD-10-CM | POA: Diagnosis not present

## 2016-09-25 DIAGNOSIS — N183 Chronic kidney disease, stage 3 unspecified: Secondary | ICD-10-CM

## 2016-09-25 DIAGNOSIS — E1151 Type 2 diabetes mellitus with diabetic peripheral angiopathy without gangrene: Secondary | ICD-10-CM | POA: Diagnosis not present

## 2016-09-25 DIAGNOSIS — E1122 Type 2 diabetes mellitus with diabetic chronic kidney disease: Secondary | ICD-10-CM | POA: Diagnosis not present

## 2016-09-25 DIAGNOSIS — I5032 Chronic diastolic (congestive) heart failure: Secondary | ICD-10-CM | POA: Diagnosis not present

## 2016-09-25 DIAGNOSIS — I5033 Acute on chronic diastolic (congestive) heart failure: Secondary | ICD-10-CM | POA: Diagnosis not present

## 2016-09-25 DIAGNOSIS — I130 Hypertensive heart and chronic kidney disease with heart failure and stage 1 through stage 4 chronic kidney disease, or unspecified chronic kidney disease: Secondary | ICD-10-CM | POA: Diagnosis not present

## 2016-09-25 DIAGNOSIS — I10 Essential (primary) hypertension: Secondary | ICD-10-CM | POA: Diagnosis not present

## 2016-09-25 DIAGNOSIS — I779 Disorder of arteries and arterioles, unspecified: Secondary | ICD-10-CM

## 2016-09-25 MED ORDER — HYDRALAZINE HCL 25 MG PO TABS: 75.0000 mg | ORAL_TABLET | Freq: Three times a day (TID) | ORAL | 3 refills | Status: DC

## 2016-09-25 MED ORDER — POTASSIUM CHLORIDE CRYS ER 20 MEQ PO TBCR: 20.0000 meq | EXTENDED_RELEASE_TABLET | Freq: Two times a day (BID) | ORAL | 3 refills | Status: DC

## 2016-09-25 MED ORDER — ISOSORBIDE MONONITRATE ER 30 MG PO TB24: 30.0000 mg | ORAL_TABLET | Freq: Every day | ORAL | 3 refills | Status: DC

## 2016-09-25 MED ORDER — ATORVASTATIN CALCIUM 40 MG PO TABS: 40.0000 mg | ORAL_TABLET | Freq: Every day | ORAL | 3 refills | Status: DC

## 2016-09-25 MED ORDER — AMLODIPINE BESYLATE 10 MG PO TABS: 10.0000 mg | ORAL_TABLET | Freq: Every day | ORAL | 3 refills | Status: DC

## 2016-09-25 MED ORDER — TORSEMIDE 20 MG PO TABS: 60.0000 mg | ORAL_TABLET | Freq: Two times a day (BID) | ORAL | 10 refills | Status: DC

## 2016-09-25 MED ORDER — SPIRONOLACTONE 25 MG PO TABS: 12.5000 mg | ORAL_TABLET | Freq: Every day | ORAL | 3 refills | Status: DC

## 2016-09-25 MED ORDER — CLOPIDOGREL BISULFATE 75 MG PO TABS: ORAL_TABLET | ORAL | 3 refills | Status: DC

## 2016-09-25 MED ORDER — LOSARTAN POTASSIUM 25 MG PO TABS: 12.5000 mg | ORAL_TABLET | Freq: Every day | ORAL | 3 refills | Status: DC

## 2016-09-25 NOTE — Patient Instructions (Signed)
Medication Instructions:  Your physician recommends that you continue on your current medications as directed. Please refer to the Current Medication list given to you today.   Labwork: NONE   Testing/Procedures: NONE   Follow-Up: Your physician recommends that you schedule a follow-up appointment in December with Dr. Domenic Polite.   Any Other Special Instructions Will Be Listed Below (If Applicable).     If you need a refill on your cardiac medications before your next appointment, please call your pharmacy.  Thank you for choosing [facility]!

## 2016-09-25 NOTE — Addendum Note (Signed)
Addended by: Levonne Hubert on: 09/25/2016 04:37 PM   Modules accepted: Orders

## 2016-09-26 DIAGNOSIS — E1151 Type 2 diabetes mellitus with diabetic peripheral angiopathy without gangrene: Secondary | ICD-10-CM | POA: Diagnosis not present

## 2016-09-26 DIAGNOSIS — N183 Chronic kidney disease, stage 3 (moderate): Secondary | ICD-10-CM | POA: Diagnosis not present

## 2016-09-26 DIAGNOSIS — I130 Hypertensive heart and chronic kidney disease with heart failure and stage 1 through stage 4 chronic kidney disease, or unspecified chronic kidney disease: Secondary | ICD-10-CM | POA: Diagnosis not present

## 2016-09-26 DIAGNOSIS — I5033 Acute on chronic diastolic (congestive) heart failure: Secondary | ICD-10-CM | POA: Diagnosis not present

## 2016-09-26 DIAGNOSIS — E1122 Type 2 diabetes mellitus with diabetic chronic kidney disease: Secondary | ICD-10-CM | POA: Diagnosis not present

## 2016-09-26 DIAGNOSIS — I2510 Atherosclerotic heart disease of native coronary artery without angina pectoris: Secondary | ICD-10-CM | POA: Diagnosis not present

## 2016-09-27 DIAGNOSIS — I2510 Atherosclerotic heart disease of native coronary artery without angina pectoris: Secondary | ICD-10-CM | POA: Diagnosis not present

## 2016-09-27 DIAGNOSIS — E1122 Type 2 diabetes mellitus with diabetic chronic kidney disease: Secondary | ICD-10-CM | POA: Diagnosis not present

## 2016-09-27 DIAGNOSIS — I5033 Acute on chronic diastolic (congestive) heart failure: Secondary | ICD-10-CM | POA: Diagnosis not present

## 2016-09-27 DIAGNOSIS — I130 Hypertensive heart and chronic kidney disease with heart failure and stage 1 through stage 4 chronic kidney disease, or unspecified chronic kidney disease: Secondary | ICD-10-CM | POA: Diagnosis not present

## 2016-09-27 DIAGNOSIS — N183 Chronic kidney disease, stage 3 (moderate): Secondary | ICD-10-CM | POA: Diagnosis not present

## 2016-09-27 DIAGNOSIS — E1151 Type 2 diabetes mellitus with diabetic peripheral angiopathy without gangrene: Secondary | ICD-10-CM | POA: Diagnosis not present

## 2016-09-28 DIAGNOSIS — I10 Essential (primary) hypertension: Secondary | ICD-10-CM | POA: Diagnosis not present

## 2016-09-28 DIAGNOSIS — E785 Hyperlipidemia, unspecified: Secondary | ICD-10-CM | POA: Diagnosis not present

## 2016-09-28 DIAGNOSIS — M545 Low back pain: Secondary | ICD-10-CM | POA: Diagnosis not present

## 2016-09-28 DIAGNOSIS — E1165 Type 2 diabetes mellitus with hyperglycemia: Secondary | ICD-10-CM | POA: Diagnosis not present

## 2016-09-28 DIAGNOSIS — E039 Hypothyroidism, unspecified: Secondary | ICD-10-CM | POA: Diagnosis not present

## 2016-09-28 DIAGNOSIS — E1142 Type 2 diabetes mellitus with diabetic polyneuropathy: Secondary | ICD-10-CM | POA: Diagnosis not present

## 2016-09-28 DIAGNOSIS — I509 Heart failure, unspecified: Secondary | ICD-10-CM | POA: Diagnosis not present

## 2016-09-28 DIAGNOSIS — Z299 Encounter for prophylactic measures, unspecified: Secondary | ICD-10-CM | POA: Diagnosis not present

## 2016-09-28 DIAGNOSIS — I639 Cerebral infarction, unspecified: Secondary | ICD-10-CM | POA: Diagnosis not present

## 2016-09-28 DIAGNOSIS — Z6833 Body mass index (BMI) 33.0-33.9, adult: Secondary | ICD-10-CM | POA: Diagnosis not present

## 2016-09-28 DIAGNOSIS — F329 Major depressive disorder, single episode, unspecified: Secondary | ICD-10-CM | POA: Diagnosis not present

## 2016-09-28 DIAGNOSIS — G20 Parkinson's disease: Secondary | ICD-10-CM | POA: Diagnosis not present

## 2016-09-29 DIAGNOSIS — I2510 Atherosclerotic heart disease of native coronary artery without angina pectoris: Secondary | ICD-10-CM | POA: Diagnosis not present

## 2016-09-29 DIAGNOSIS — E1122 Type 2 diabetes mellitus with diabetic chronic kidney disease: Secondary | ICD-10-CM | POA: Diagnosis not present

## 2016-09-29 DIAGNOSIS — I5033 Acute on chronic diastolic (congestive) heart failure: Secondary | ICD-10-CM | POA: Diagnosis not present

## 2016-09-29 DIAGNOSIS — I130 Hypertensive heart and chronic kidney disease with heart failure and stage 1 through stage 4 chronic kidney disease, or unspecified chronic kidney disease: Secondary | ICD-10-CM | POA: Diagnosis not present

## 2016-09-29 DIAGNOSIS — N183 Chronic kidney disease, stage 3 (moderate): Secondary | ICD-10-CM | POA: Diagnosis not present

## 2016-09-29 DIAGNOSIS — E1151 Type 2 diabetes mellitus with diabetic peripheral angiopathy without gangrene: Secondary | ICD-10-CM | POA: Diagnosis not present

## 2016-10-02 DIAGNOSIS — I10 Essential (primary) hypertension: Secondary | ICD-10-CM | POA: Diagnosis not present

## 2016-10-02 DIAGNOSIS — E039 Hypothyroidism, unspecified: Secondary | ICD-10-CM | POA: Diagnosis not present

## 2016-10-02 DIAGNOSIS — N183 Chronic kidney disease, stage 3 (moderate): Secondary | ICD-10-CM | POA: Diagnosis not present

## 2016-10-02 DIAGNOSIS — E119 Type 2 diabetes mellitus without complications: Secondary | ICD-10-CM | POA: Diagnosis not present

## 2016-10-02 DIAGNOSIS — I130 Hypertensive heart and chronic kidney disease with heart failure and stage 1 through stage 4 chronic kidney disease, or unspecified chronic kidney disease: Secondary | ICD-10-CM | POA: Diagnosis not present

## 2016-10-02 DIAGNOSIS — I2510 Atherosclerotic heart disease of native coronary artery without angina pectoris: Secondary | ICD-10-CM | POA: Diagnosis not present

## 2016-10-02 DIAGNOSIS — I509 Heart failure, unspecified: Secondary | ICD-10-CM | POA: Diagnosis not present

## 2016-10-02 DIAGNOSIS — I5033 Acute on chronic diastolic (congestive) heart failure: Secondary | ICD-10-CM | POA: Diagnosis not present

## 2016-10-02 DIAGNOSIS — I5032 Chronic diastolic (congestive) heart failure: Secondary | ICD-10-CM | POA: Diagnosis not present

## 2016-10-02 DIAGNOSIS — E1151 Type 2 diabetes mellitus with diabetic peripheral angiopathy without gangrene: Secondary | ICD-10-CM | POA: Diagnosis not present

## 2016-10-02 DIAGNOSIS — E1122 Type 2 diabetes mellitus with diabetic chronic kidney disease: Secondary | ICD-10-CM | POA: Diagnosis not present

## 2016-10-02 LAB — CBC WITH DIFFERENTIAL/PLATELET
BASOS PCT: 0.4 %
Basophils Absolute: 32 cells/uL (ref 0–200)
EOS PCT: 1.7 %
Eosinophils Absolute: 138 cells/uL (ref 15–500)
HCT: 31.3 % — ABNORMAL LOW (ref 35.0–45.0)
Hemoglobin: 9.9 g/dL — ABNORMAL LOW (ref 11.7–15.5)
LYMPHS ABS: 851 {cells}/uL (ref 850–3900)
MCH: 28.9 pg (ref 27.0–33.0)
MCHC: 31.6 g/dL — ABNORMAL LOW (ref 32.0–36.0)
MCV: 91.3 fL (ref 80.0–100.0)
MPV: 9.4 fL (ref 7.5–12.5)
Monocytes Relative: 7.2 %
NEUTROS PCT: 80.2 %
Neutro Abs: 6496 cells/uL (ref 1500–7800)
PLATELETS: 241 10*3/uL (ref 140–400)
RBC: 3.43 10*6/uL — AB (ref 3.80–5.10)
RDW: 16.2 % — AB (ref 11.0–15.0)
TOTAL LYMPHOCYTE: 10.5 %
WBC mixed population: 583 cells/uL (ref 200–950)
WBC: 8.1 10*3/uL (ref 3.8–10.8)

## 2016-10-02 LAB — T4, FREE: FREE T4: 1 ng/dL (ref 0.8–1.8)

## 2016-10-02 LAB — BASIC METABOLIC PANEL
BUN/Creatinine Ratio: 39 (calc) — ABNORMAL HIGH (ref 6–22)
BUN: 63 mg/dL — AB (ref 7–25)
CO2: 29 mmol/L (ref 20–32)
CREATININE: 1.62 mg/dL — AB (ref 0.50–0.99)
Calcium: 9.3 mg/dL (ref 8.6–10.4)
Chloride: 100 mmol/L (ref 98–110)
Glucose, Bld: 159 mg/dL — ABNORMAL HIGH (ref 65–99)
Potassium: 4.9 mmol/L (ref 3.5–5.3)
Sodium: 137 mmol/L (ref 135–146)

## 2016-10-02 LAB — TSH: TSH: 1.98 m[IU]/L (ref 0.40–4.50)

## 2016-10-03 DIAGNOSIS — E1151 Type 2 diabetes mellitus with diabetic peripheral angiopathy without gangrene: Secondary | ICD-10-CM | POA: Diagnosis not present

## 2016-10-03 DIAGNOSIS — N183 Chronic kidney disease, stage 3 (moderate): Secondary | ICD-10-CM | POA: Diagnosis not present

## 2016-10-03 DIAGNOSIS — I2510 Atherosclerotic heart disease of native coronary artery without angina pectoris: Secondary | ICD-10-CM | POA: Diagnosis not present

## 2016-10-03 DIAGNOSIS — I130 Hypertensive heart and chronic kidney disease with heart failure and stage 1 through stage 4 chronic kidney disease, or unspecified chronic kidney disease: Secondary | ICD-10-CM | POA: Diagnosis not present

## 2016-10-03 DIAGNOSIS — I5033 Acute on chronic diastolic (congestive) heart failure: Secondary | ICD-10-CM | POA: Diagnosis not present

## 2016-10-03 DIAGNOSIS — E1122 Type 2 diabetes mellitus with diabetic chronic kidney disease: Secondary | ICD-10-CM | POA: Diagnosis not present

## 2016-10-04 DIAGNOSIS — E1151 Type 2 diabetes mellitus with diabetic peripheral angiopathy without gangrene: Secondary | ICD-10-CM | POA: Diagnosis not present

## 2016-10-04 DIAGNOSIS — I2510 Atherosclerotic heart disease of native coronary artery without angina pectoris: Secondary | ICD-10-CM | POA: Diagnosis not present

## 2016-10-04 DIAGNOSIS — N183 Chronic kidney disease, stage 3 (moderate): Secondary | ICD-10-CM | POA: Diagnosis not present

## 2016-10-04 DIAGNOSIS — E1122 Type 2 diabetes mellitus with diabetic chronic kidney disease: Secondary | ICD-10-CM | POA: Diagnosis not present

## 2016-10-04 DIAGNOSIS — I5033 Acute on chronic diastolic (congestive) heart failure: Secondary | ICD-10-CM | POA: Diagnosis not present

## 2016-10-04 DIAGNOSIS — I130 Hypertensive heart and chronic kidney disease with heart failure and stage 1 through stage 4 chronic kidney disease, or unspecified chronic kidney disease: Secondary | ICD-10-CM | POA: Diagnosis not present

## 2016-10-06 DIAGNOSIS — I5033 Acute on chronic diastolic (congestive) heart failure: Secondary | ICD-10-CM | POA: Diagnosis not present

## 2016-10-06 DIAGNOSIS — N183 Chronic kidney disease, stage 3 (moderate): Secondary | ICD-10-CM | POA: Diagnosis not present

## 2016-10-06 DIAGNOSIS — E1122 Type 2 diabetes mellitus with diabetic chronic kidney disease: Secondary | ICD-10-CM | POA: Diagnosis not present

## 2016-10-06 DIAGNOSIS — I2510 Atherosclerotic heart disease of native coronary artery without angina pectoris: Secondary | ICD-10-CM | POA: Diagnosis not present

## 2016-10-06 DIAGNOSIS — I130 Hypertensive heart and chronic kidney disease with heart failure and stage 1 through stage 4 chronic kidney disease, or unspecified chronic kidney disease: Secondary | ICD-10-CM | POA: Diagnosis not present

## 2016-10-06 DIAGNOSIS — E1151 Type 2 diabetes mellitus with diabetic peripheral angiopathy without gangrene: Secondary | ICD-10-CM | POA: Diagnosis not present

## 2016-10-10 DIAGNOSIS — I2510 Atherosclerotic heart disease of native coronary artery without angina pectoris: Secondary | ICD-10-CM | POA: Diagnosis not present

## 2016-10-10 DIAGNOSIS — E1122 Type 2 diabetes mellitus with diabetic chronic kidney disease: Secondary | ICD-10-CM | POA: Diagnosis not present

## 2016-10-10 DIAGNOSIS — I5033 Acute on chronic diastolic (congestive) heart failure: Secondary | ICD-10-CM | POA: Diagnosis not present

## 2016-10-10 DIAGNOSIS — N183 Chronic kidney disease, stage 3 (moderate): Secondary | ICD-10-CM | POA: Diagnosis not present

## 2016-10-10 DIAGNOSIS — E1151 Type 2 diabetes mellitus with diabetic peripheral angiopathy without gangrene: Secondary | ICD-10-CM | POA: Diagnosis not present

## 2016-10-10 DIAGNOSIS — I130 Hypertensive heart and chronic kidney disease with heart failure and stage 1 through stage 4 chronic kidney disease, or unspecified chronic kidney disease: Secondary | ICD-10-CM | POA: Diagnosis not present

## 2016-10-11 DIAGNOSIS — I130 Hypertensive heart and chronic kidney disease with heart failure and stage 1 through stage 4 chronic kidney disease, or unspecified chronic kidney disease: Secondary | ICD-10-CM | POA: Diagnosis not present

## 2016-10-11 DIAGNOSIS — E1122 Type 2 diabetes mellitus with diabetic chronic kidney disease: Secondary | ICD-10-CM | POA: Diagnosis not present

## 2016-10-11 DIAGNOSIS — I5033 Acute on chronic diastolic (congestive) heart failure: Secondary | ICD-10-CM | POA: Diagnosis not present

## 2016-10-11 DIAGNOSIS — I2510 Atherosclerotic heart disease of native coronary artery without angina pectoris: Secondary | ICD-10-CM | POA: Diagnosis not present

## 2016-10-11 DIAGNOSIS — N183 Chronic kidney disease, stage 3 (moderate): Secondary | ICD-10-CM | POA: Diagnosis not present

## 2016-10-11 DIAGNOSIS — E1151 Type 2 diabetes mellitus with diabetic peripheral angiopathy without gangrene: Secondary | ICD-10-CM | POA: Diagnosis not present

## 2016-10-13 DIAGNOSIS — N183 Chronic kidney disease, stage 3 (moderate): Secondary | ICD-10-CM | POA: Diagnosis not present

## 2016-10-13 DIAGNOSIS — I130 Hypertensive heart and chronic kidney disease with heart failure and stage 1 through stage 4 chronic kidney disease, or unspecified chronic kidney disease: Secondary | ICD-10-CM | POA: Diagnosis not present

## 2016-10-13 DIAGNOSIS — E1122 Type 2 diabetes mellitus with diabetic chronic kidney disease: Secondary | ICD-10-CM | POA: Diagnosis not present

## 2016-10-13 DIAGNOSIS — E1151 Type 2 diabetes mellitus with diabetic peripheral angiopathy without gangrene: Secondary | ICD-10-CM | POA: Diagnosis not present

## 2016-10-13 DIAGNOSIS — I5033 Acute on chronic diastolic (congestive) heart failure: Secondary | ICD-10-CM | POA: Diagnosis not present

## 2016-10-13 DIAGNOSIS — I2510 Atherosclerotic heart disease of native coronary artery without angina pectoris: Secondary | ICD-10-CM | POA: Diagnosis not present

## 2016-10-16 DIAGNOSIS — N183 Chronic kidney disease, stage 3 (moderate): Secondary | ICD-10-CM | POA: Diagnosis not present

## 2016-10-16 DIAGNOSIS — E1151 Type 2 diabetes mellitus with diabetic peripheral angiopathy without gangrene: Secondary | ICD-10-CM | POA: Diagnosis not present

## 2016-10-16 DIAGNOSIS — I5033 Acute on chronic diastolic (congestive) heart failure: Secondary | ICD-10-CM | POA: Diagnosis not present

## 2016-10-16 DIAGNOSIS — E1122 Type 2 diabetes mellitus with diabetic chronic kidney disease: Secondary | ICD-10-CM | POA: Diagnosis not present

## 2016-10-16 DIAGNOSIS — I2510 Atherosclerotic heart disease of native coronary artery without angina pectoris: Secondary | ICD-10-CM | POA: Diagnosis not present

## 2016-10-16 DIAGNOSIS — I130 Hypertensive heart and chronic kidney disease with heart failure and stage 1 through stage 4 chronic kidney disease, or unspecified chronic kidney disease: Secondary | ICD-10-CM | POA: Diagnosis not present

## 2016-10-18 DIAGNOSIS — I2510 Atherosclerotic heart disease of native coronary artery without angina pectoris: Secondary | ICD-10-CM | POA: Diagnosis not present

## 2016-10-18 DIAGNOSIS — E1122 Type 2 diabetes mellitus with diabetic chronic kidney disease: Secondary | ICD-10-CM | POA: Diagnosis not present

## 2016-10-18 DIAGNOSIS — N183 Chronic kidney disease, stage 3 (moderate): Secondary | ICD-10-CM | POA: Diagnosis not present

## 2016-10-18 DIAGNOSIS — I5033 Acute on chronic diastolic (congestive) heart failure: Secondary | ICD-10-CM | POA: Diagnosis not present

## 2016-10-18 DIAGNOSIS — I130 Hypertensive heart and chronic kidney disease with heart failure and stage 1 through stage 4 chronic kidney disease, or unspecified chronic kidney disease: Secondary | ICD-10-CM | POA: Diagnosis not present

## 2016-10-18 DIAGNOSIS — E1151 Type 2 diabetes mellitus with diabetic peripheral angiopathy without gangrene: Secondary | ICD-10-CM | POA: Diagnosis not present

## 2016-10-20 ENCOUNTER — Encounter (HOSPITAL_COMMUNITY): Payer: Medicare Other | Admitting: Cardiology

## 2016-10-20 DIAGNOSIS — E1151 Type 2 diabetes mellitus with diabetic peripheral angiopathy without gangrene: Secondary | ICD-10-CM | POA: Diagnosis not present

## 2016-10-20 DIAGNOSIS — N183 Chronic kidney disease, stage 3 (moderate): Secondary | ICD-10-CM | POA: Diagnosis not present

## 2016-10-20 DIAGNOSIS — I130 Hypertensive heart and chronic kidney disease with heart failure and stage 1 through stage 4 chronic kidney disease, or unspecified chronic kidney disease: Secondary | ICD-10-CM | POA: Diagnosis not present

## 2016-10-20 DIAGNOSIS — I2510 Atherosclerotic heart disease of native coronary artery without angina pectoris: Secondary | ICD-10-CM | POA: Diagnosis not present

## 2016-10-20 DIAGNOSIS — E1122 Type 2 diabetes mellitus with diabetic chronic kidney disease: Secondary | ICD-10-CM | POA: Diagnosis not present

## 2016-10-20 DIAGNOSIS — I5033 Acute on chronic diastolic (congestive) heart failure: Secondary | ICD-10-CM | POA: Diagnosis not present

## 2016-10-23 DIAGNOSIS — I5033 Acute on chronic diastolic (congestive) heart failure: Secondary | ICD-10-CM | POA: Diagnosis not present

## 2016-10-23 DIAGNOSIS — I130 Hypertensive heart and chronic kidney disease with heart failure and stage 1 through stage 4 chronic kidney disease, or unspecified chronic kidney disease: Secondary | ICD-10-CM | POA: Diagnosis not present

## 2016-10-23 DIAGNOSIS — N183 Chronic kidney disease, stage 3 (moderate): Secondary | ICD-10-CM | POA: Diagnosis not present

## 2016-10-23 DIAGNOSIS — I2510 Atherosclerotic heart disease of native coronary artery without angina pectoris: Secondary | ICD-10-CM | POA: Diagnosis not present

## 2016-10-23 DIAGNOSIS — E1151 Type 2 diabetes mellitus with diabetic peripheral angiopathy without gangrene: Secondary | ICD-10-CM | POA: Diagnosis not present

## 2016-10-23 DIAGNOSIS — E1122 Type 2 diabetes mellitus with diabetic chronic kidney disease: Secondary | ICD-10-CM | POA: Diagnosis not present

## 2016-10-24 DIAGNOSIS — Z794 Long term (current) use of insulin: Secondary | ICD-10-CM | POA: Diagnosis not present

## 2016-10-24 DIAGNOSIS — R2689 Other abnormalities of gait and mobility: Secondary | ICD-10-CM | POA: Diagnosis not present

## 2016-10-24 DIAGNOSIS — I130 Hypertensive heart and chronic kidney disease with heart failure and stage 1 through stage 4 chronic kidney disease, or unspecified chronic kidney disease: Secondary | ICD-10-CM | POA: Diagnosis not present

## 2016-10-24 DIAGNOSIS — E039 Hypothyroidism, unspecified: Secondary | ICD-10-CM | POA: Diagnosis not present

## 2016-10-24 DIAGNOSIS — F329 Major depressive disorder, single episode, unspecified: Secondary | ICD-10-CM | POA: Diagnosis not present

## 2016-10-24 DIAGNOSIS — Z9981 Dependence on supplemental oxygen: Secondary | ICD-10-CM | POA: Diagnosis not present

## 2016-10-24 DIAGNOSIS — L989 Disorder of the skin and subcutaneous tissue, unspecified: Secondary | ICD-10-CM | POA: Diagnosis not present

## 2016-10-24 DIAGNOSIS — Z951 Presence of aortocoronary bypass graft: Secondary | ICD-10-CM | POA: Diagnosis not present

## 2016-10-24 DIAGNOSIS — E785 Hyperlipidemia, unspecified: Secondary | ICD-10-CM | POA: Diagnosis not present

## 2016-10-24 DIAGNOSIS — I272 Pulmonary hypertension, unspecified: Secondary | ICD-10-CM | POA: Diagnosis not present

## 2016-10-24 DIAGNOSIS — M170 Bilateral primary osteoarthritis of knee: Secondary | ICD-10-CM | POA: Diagnosis not present

## 2016-10-24 DIAGNOSIS — E1122 Type 2 diabetes mellitus with diabetic chronic kidney disease: Secondary | ICD-10-CM | POA: Diagnosis not present

## 2016-10-24 DIAGNOSIS — I252 Old myocardial infarction: Secondary | ICD-10-CM | POA: Diagnosis not present

## 2016-10-24 DIAGNOSIS — Z87891 Personal history of nicotine dependence: Secondary | ICD-10-CM | POA: Diagnosis not present

## 2016-10-24 DIAGNOSIS — E1151 Type 2 diabetes mellitus with diabetic peripheral angiopathy without gangrene: Secondary | ICD-10-CM | POA: Diagnosis not present

## 2016-10-24 DIAGNOSIS — I2510 Atherosclerotic heart disease of native coronary artery without angina pectoris: Secondary | ICD-10-CM | POA: Diagnosis not present

## 2016-10-24 DIAGNOSIS — I5033 Acute on chronic diastolic (congestive) heart failure: Secondary | ICD-10-CM | POA: Diagnosis not present

## 2016-10-24 DIAGNOSIS — N183 Chronic kidney disease, stage 3 (moderate): Secondary | ICD-10-CM | POA: Diagnosis not present

## 2016-10-27 ENCOUNTER — Ambulatory Visit (INDEPENDENT_AMBULATORY_CARE_PROVIDER_SITE_OTHER): Payer: Medicare Other | Admitting: "Endocrinology

## 2016-10-27 ENCOUNTER — Encounter: Payer: Self-pay | Admitting: "Endocrinology

## 2016-10-27 VITALS — BP 124/71 | HR 76 | Ht 66.0 in | Wt 198.0 lb

## 2016-10-27 DIAGNOSIS — E1165 Type 2 diabetes mellitus with hyperglycemia: Secondary | ICD-10-CM

## 2016-10-27 DIAGNOSIS — E039 Hypothyroidism, unspecified: Secondary | ICD-10-CM

## 2016-10-27 DIAGNOSIS — Z6831 Body mass index (BMI) 31.0-31.9, adult: Secondary | ICD-10-CM

## 2016-10-27 DIAGNOSIS — E6609 Other obesity due to excess calories: Secondary | ICD-10-CM

## 2016-10-27 DIAGNOSIS — I779 Disorder of arteries and arterioles, unspecified: Secondary | ICD-10-CM | POA: Diagnosis not present

## 2016-10-27 DIAGNOSIS — E782 Mixed hyperlipidemia: Secondary | ICD-10-CM | POA: Diagnosis not present

## 2016-10-27 MED ORDER — SITAGLIPTIN PHOSPHATE 25 MG PO TABS: 25.0000 mg | ORAL_TABLET | Freq: Every day | ORAL | 3 refills | Status: DC

## 2016-10-27 MED ORDER — LEVOTHYROXINE SODIUM 88 MCG PO TABS: 88.0000 ug | ORAL_TABLET | Freq: Every day | ORAL | 3 refills | Status: DC

## 2016-10-27 NOTE — Patient Instructions (Signed)

## 2016-10-27 NOTE — Progress Notes (Signed)
Subjective:    Patient ID: [name] [name], female    DOB: 02-13-1945, PCP Glenda Chroman, MD.   Past Medical History:  Diagnosis Date  . Arthritis   . Carotid artery disease (HCC)    a. Bilateral CEA; 50-60% bilateral ICA stenosis, 11/12  . Cellulitis    a. Recurrent, bilateral legs  . CHF (congestive heart failure) ([place])   . Chronic back pain   . Chronic diastolic heart failure ([place])   . Coronary atherosclerosis of native coronary artery    a. 11/2010 NSTEMI/CABG x 4: L-LAD; S-PL; S-OM; S-DX, by PVT.  [name] Critical lower limb ischemia   . Depression   . Essential hypertension, benign   . Herniated disc   . History of blood transfusion   . Hypothyroidism   . Ischemic cardiomyopathy    a. 11/2010 TEE: EF 40-45%.  . Migraine   . Mixed hyperlipidemia   . NSTEMI (non-ST elevated myocardial infarction) ([place]) 11/2010  . On home oxygen therapy   . PAD (peripheral artery disease) ([place])   . Pleural effusion   . Pneumonia 2000's X 2  . Stroke ([place]) 08/2009  . Suicide attempt ([place]) 08/2002  . TIA (transient ischemic attack)   . TMJ syndrome   . Type 2 diabetes mellitus ([place])   . Venous insufficiency    Past Surgical History:  Procedure Laterality Date  . ABDOMINAL HYSTERECTOMY    . APPENDECTOMY    . CARDIAC CATHETERIZATION  11/2010  . CARDIAC CATHETERIZATION N/A 09/16/2015   Procedure: Right Heart Cath;  Surgeon: Larey Dresser, MD;  Location: [facility];  Service: Cardiovascular;  Laterality: N/A;  . CAROTID ENDARTERECTOMY Bilateral 2011   Bilateral - Dr. Kellie Simmering  . CHOLECYSTECTOMY    . COLONOSCOPY W/ BIOPSIES AND POLYPECTOMY    . CORONARY ANGIOPLASTY WITH STENT PLACEMENT  03/2013   "1"  . CORONARY ARTERY BYPASS GRAFT  11/24/2010   Procedure: CORONARY ARTERY BYPASS GRAFTING (CABG);  Surgeon: Tharon Aquas Adelene Idler, MD;  Location: [facility];  Service: Open Heart Surgery;  Laterality: N/A;  Coronary Artery Bypass Graft times four on pump utilizing left internal mammary artery and  bilateral greater saphenous veins harvested endoscopically, transesophageal echocardiogram   . DEBRIDEMENT TOE Left    "nonhealing wound; 3rd digit  . DILATION AND CURETTAGE OF UTERUS    . ENDARTERECTOMY POPLITEAL Left 10/07/2013   Procedure: LEFT POPLITEAL ENDARTERECTOMY ;  Surgeon: Angelia Mould, MD;  Location: [facility];  Service: Vascular;  Laterality: Left;  . FEMORAL-POPLITEAL BYPASS GRAFT Left 10/07/2013   Procedure: LEFT FEMORAL-POPLITEAL ARTERY BYPASS GRAFT;  Surgeon: Angelia Mould, MD;  Location: [facility];  Service: Vascular;  Laterality: Left;  . FRACTURE SURGERY    . INTRAOPERATIVE ARTERIOGRAM Left 10/07/2013   Procedure: INTRA OPERATIVE ARTERIOGRAM LEFT LEG;  Surgeon: Angelia Mould, MD;  Location: [facility];  Service: Vascular;  Laterality: Left;  . IR GENERIC HISTORICAL  09/21/2015   IR REMOVAL OF PLURAL CATH W/CUFF 09/21/2015 Marybelle Killings, MD [facility]-INTER[facility] RAD  . LEFT AND RIGHT HEART CATHETERIZATION WITH CORONARY/GRAFT ANGIOGRAM N/A 03/27/2013   Procedure: LEFT AND RIGHT HEART CATHETERIZATION WITH Beatrix Fetters;  Surgeon: Burnell Blanks, MD;  Location: [facility] CATH LAB;  Service: Cardiovascular;  Laterality: N/A;  . LOWER EXTREMITY ANGIOGRAM  09/08/2012; 10/06/2013   "found 100% blockage; unsuccessful attempt at crossing a chronic total occlusion of the left SFA in the setting of critical limb ischemia  . LOWER EXTREMITY ANGIOGRAM Bilateral  09/08/2013   Procedure: LOWER EXTREMITY ANGIOGRAM;  Surgeon: [name], MD;  Location: [facility] CATH LAB;  Service: Cardiovascular;  Laterality: Bilateral;  . PERIPHERAL VASCULAR CATHETERIZATION  11/04/2015   Procedure: Peripheral Vascular Atherectomy;  Surgeon: [name], MD;  Location: [facility];  Service: Cardiovascular;;  RT. SFA  . PERIPHERAL VASCULAR CATHETERIZATION Bilateral 11/04/2015   Procedure: Lower Extremity Intervention;  Surgeon: [name], MD;  Location: [facility];  Service:  Cardiovascular;  Laterality: Bilateral;  . PERIPHERAL VASCULAR CATHETERIZATION  11/04/2015   Procedure: Peripheral Vascular Balloon Angioplasty;  Surgeon: [name], MD;  Location: [facility];  Service: Cardiovascular;;  TP Trunk  . TOE SURGERY Left    "put pin in 2nd toe"  . TUBAL LIGATION    . WRIST FRACTURE SURGERY Left    "grafted bone from hip to wrist"   Social History   Social History  . Marital status: Widowed    Spouse name: N/A  . Number of children: N/A  . Years of education: N/A   Social History Main Topics  . Smoking status: Former Smoker    Packs/day: 3.00    Years: 50.00    Types: Cigarettes    Start date: 01/24/1956    Quit date: 10/09/2009  . Smokeless tobacco: Never Used  . Alcohol use 0.0 oz/week     Comment: 10/06/2013 "might have a drink q 2-3 months"  . Drug use: Yes    Types: Marijuana     Comment: "smoked pot in my teens"  . Sexual activity: Not Currently   Other Topics Concern  . None   Social History Narrative   Lives in [place] by herself.  She does not work.   Outpatient Encounter Prescriptions as of 10/27/2016  Medication Sig  . acetaminophen (TYLENOL) 500 MG tablet Take 500-1,000 mg by mouth every 6 (six) hours as needed for mild pain or moderate pain.   [name] albuterol (PROVENTIL HFA;VENTOLIN HFA) 108 (90 BASE) MCG/ACT inhaler Inhale 2 puffs into the lungs daily as needed for wheezing or shortness of breath.   [name] amLODipine (NORVASC) 10 MG tablet Take 1 tablet (10 mg total) by mouth daily.  [name] aspirin 81 MG EC tablet Take 81 mg by mouth daily.  [name] atorvastatin (LIPITOR) 40 MG tablet Take 1 tablet (40 mg total) by mouth daily at 6 PM.  . citalopram (CELEXA) 40 MG tablet Take 40 mg by mouth every morning.   . clopidogrel (PLAVIX) 75 MG tablet TAKE ONE TABLET BY MOUTH ONCE DAILY WITH  BREAKFAST  . clotrimazole-betamethasone (LOTRISONE) cream Apply 1 application topically 2 (two) times daily as needed.  Mariane Baumgarten Calcium (STOOL SOFTENER  PO) Take 200 mg by mouth at bedtime as needed (for constipation).   . gabapentin (NEURONTIN) 300 MG capsule Take 300-600 mg by mouth See admin instructions. 300 mg in the morning and 600 mg prior to bed(time)  . hydrALAZINE (APRESOLINE) 25 MG tablet Take 3 tablets (75 mg total) by mouth every 8 (eight) hours.  . insulin detemir (LEVEMIR) 100 UNIT/ML injection Inject 10 Units into the skin daily with breakfast.  . isosorbide mononitrate (IMDUR) 30 MG 24 hr tablet Take 1 tablet (30 mg total) by mouth daily.  [name] levothyroxine (SYNTHROID, LEVOTHROID) 88 MCG tablet Take 1 tablet (88 mcg total) by mouth daily before breakfast.  . losartan (COZAAR) 25 MG tablet Take 0.5 tablets (12.5 mg total) by mouth daily.  . Multiple Vitamin (MULTIVITAMIN WITH MINERALS)  TABS tablet Take 1 tablet by mouth daily.  . nitroGLYCERIN (NITROSTAT) 0.4 MG SL tablet Place 1 tablet (0.4 mg total) under the tongue every 5 (five) minutes as needed for chest pain. Up to 3 doses. If no relief after 3rd dose, proceed to the ED for an evaluation  . nystatin (MYCOSTATIN/NYSTOP) powder Apply 1 application topically 2 (two) times daily as needed.  . potassium chloride SA (K-DUR,KLOR-CON) 20 MEQ tablet Take 1 tablet (20 mEq total) by mouth 2 (two) times daily.  . sitaGLIPtin (JANUVIA) 25 MG tablet Take 1 tablet (25 mg total) by mouth daily.  [name] spironolactone (ALDACTONE) 25 MG tablet Take 0.5 tablets (12.5 mg total) by mouth daily.  [name] tiZANidine (ZANAFLEX) 4 MG tablet Take 4 mg by mouth 2 (two) times daily as needed for muscle spasms.  [name] torsemide (DEMADEX) 20 MG tablet Take 3 tablets (60 mg total) by mouth 2 (two) times daily.  . traMADol (ULTRAM) 50 MG tablet Take 50 mg by mouth.  . umeclidinium-vilanterol (ANORO ELLIPTA) 62.5-25 MCG/INH AEPB Inhale 1 puff into the lungs daily.  . [DISCONTINUED] HUMALOG KWIKPEN 100 UNIT/ML KiwkPen Inject 3-7 Units into the muscle 3 (three) times daily.  . [DISCONTINUED] levothyroxine (SYNTHROID,  LEVOTHROID) 75 MCG tablet Take 1 tablet (75 mcg total) by mouth daily before breakfast.   No facility-administered encounter medications on file as of 10/27/2016.    ALLERGIES: Allergies  Allergen Reactions  . Other Shortness Of Breath, Rash and Other (See Comments)    CANNOT EAT ANY BERRIES!!  . Strawberry Extract Hives, Shortness Of Breath and Rash  . Sulfa Antibiotics Swelling and Other (See Comments)    Bodily Swelling  . Coffee Bean Extract [Coffea Arabica] Itching and Rash  . Tape Other (See Comments)    Tears skin.  Please use "paper" tape only.   VACCINATION STATUS: Immunization History  Administered Date(s) Administered  . Influenza,inj,Quad PF,6+ Mos 10/10/2013, 09/05/2014  . Pneumococcal Polysaccharide-23 03/21/2013, 09/05/2014    HPI  The patient presents today with a medical history as above, and is being seen in f/u  for hypothyroidism. She is on levothyroxine 75 g by mouth every morning. She  reports compliance. - She has no new complaints. She has more energy, denies cold intolerance. She has Gained 10 pounds since last visit.  - She wishes for me to handle her diabetes. I will have her recent A1c however not too long ago it was 6.4%. She is on Levemir 70 units daily at bedtime and Humalog 3-7 units 3 times a day before meals.  - Her daughter reports that she does have fluctuating blood glucose readings including significant amount of hypoglycemic episodes.  - She did not bring her meter with her today.   Review of Systems Constitutional:  + weight gain ( mainly due to edema , + fatigue, - hyperthermia Eyes: no blurry vision, +xerophthalmia ENT: no sore throat, no nodules palpated in throat, no dysphagia/odynophagia, no hoarseness Cardiovascular: no CP/SOB/palpitations/leg swelling Respiratory: + cough, +SOB Gastrointestinal: no N/V/D/C Musculoskeletal: no muscle/joint aches Skin: no rashes Neurological:  No tremors, - numbness/tingling, +  dizziness Psychiatric: +anxiety  Objective:    BP 124/71   Pulse 76   Ht _0  (1.676 m)   Wt 198 lb (89.8 kg)   BMI 31.96 kg/m   Wt Readings from Last 3 Encounters:  10/27/16 198 lb (89.8 kg)  09/25/16 200 lb 12.8 oz (91.1 kg)  08/17/16 188 lb 3.2 oz (85.4 kg)    Physical Exam  Constitutional: overweight, in NAD, she uses oxygen per nasal cannula Eyes: PERRLA, EOMI,  ENT: moist mucous membranes, no thyromegaly, no cervical lymphadenopathy Cardiovascular: Distant heart sounds , midline sternotomy scar from prior coronary artery bypass graft Respiratory: Tight  chest was poor air entry. Gastrointestinal: abdomen soft, NT, ND, BS+ Musculoskeletal: no deformities, strength intact in all 4 Skin: moist, warm, no rashes Neurological: -  tremor with outstretched hands, DTR normal in all 4   Complete Blood Count (Most recent): Lab Results  Component Value Date   WBC 8.1 10/02/2016   HGB 9.9 (L) 10/02/2016   HCT 31.3 (L) 10/02/2016   MCV 91.3 10/02/2016   PLT 241 10/02/2016   Chemistry (most recent): Lab Results  Component Value Date   NA 137 10/02/2016   K 4.9 10/02/2016   CL 100 10/02/2016   CO2 29 10/02/2016   BUN 63 (H) 10/02/2016   CREATININE 1.62 (H) 10/02/2016   Diabetic Labs (most recent): Lab Results  Component Value Date   HGBA1C 6.4 (H) 09/14/2015   HGBA1C 10.8 (H) 03/18/2013   HGBA1C 8.1 (H) 11/22/2010   Results for AHLAYAH, TARKOWSKI (MRN [id number]) as of 10/27/2016 12:21  Ref. Range 06/14/2016 10:12 07/28/2016 18:39 10/02/2016 11:29  TSH Latest Ref Range: 0.40 - 4.50 mIU/L 3.41 1.323 1.98  T4,Free(Direct) Latest Ref Range: 0.8 - 1.8 ng/dL 1.0  1.0      Assessment & Plan:   1. Hypothyroidism:   - She has responded to therapy with levothyroxine. Based on her  Thyroid function test,  she will benefit from slight increase in the dose of her levothyroxine. - I will prescribe levothyroxine 88 g by mouth every morning.   - We discussed about correct intake  of levothyroxine, at fasting, with water, separated by at least 30 minutes from breakfast, and separated by more than 4 hours from calcium, iron, multivitamins, acid reflux medications (PPIs). -Patient is made aware of the fact that thyroid hormone replacement is needed for life, dose to be adjusted by periodic monitoring of thyroid function tests.  2) Type 2 Diabetes - complicated  - her  diabetes is  complicated by CAD, CHF, CKD and patient remains at a high risk for more acute and chronic complications of diabetes which include CAD, CVA, CKD, retinopathy, and neuropathy. These are all discussed in detail with the patient.  Patient came without her  glucose profile, but reports that she is having hypoglycemia episodes randomly.  Her a1c from 09/2015 was 6.4%. -  Recent labs reviewed.  - I have re-counseled the patient on diet management and  Weight control by adopting a carbohydrate restricted / protein rich  Diet.  -  Suggestion is made for her to avoid simple carbohydrates  from her diet including Cakes, Sweet Desserts / Pastries, Ice Cream, Soda (diet and regular), Sweet Tea, Candies, Chips, Cookies, Store Bought Juices, Alcohol in Excess of  1-2 drinks a day, Artificial Sweeteners, and "Sugar-free" Products. This will help patient to have stable blood glucose profile and potentially avoid unintended weight gain.  - Patient is advised to stick to a routine mealtimes to eat 3 meals  a day and avoid unnecessary snacks ( to snack only to correct hypoglycemia).   - I have approached patient with the following individualized plan to manage diabetes and patient agrees.  - I will proceed with basal insulin  Levemir 10 units daily ay 8AM,  Hold Humalog for now, initiate strict  monitoring of glucose  2 times a day-  daily before breakfast and at bed time.  - Patient is warned not to take insulin without proper monitoring per orders. -Patient is encouraged to call clinic for blood glucose levels  less than 70 or above 300 mg /dl. - I will add Januvia 25 mg po qam,  therapeutically suitable for patient. -Patient is not a candidate for  Metformin, SGLT2 inhibitors  due to CKD.  - Patient will be considered for incretin therapy as appropriate next visit. - Patient specific target  for A1c; LDL, HDL, Triglycerides, and  Waist Circumference were discussed in detail.  3) BP/HTN: Uncontrolled , but better. Continue current medications including ARB. 4) Lipids/HPL: Controlled, LDL 36.  Patient is advised to continue statins.  5) Chronic Care/Health Maintenance:  -Patient is on ACEI/ARB and Statin medications and encouraged to continue to follow up with Ophthalmology, Podiatrist at least yearly or according to recommendations, and advised to stay away from smoking. I have recommended yearly flu vaccine and pneumonia vaccination at least every 5 years; moderate intensity exercise for up to 150 minutes weekly; and  sleep for at least 7 hours a day.  - Time spent with the patient: 25 min, of which >50% was spent in reviewing her  hypo- and hyper-glycemic episodes, reviewing her current and  previous labs , treatments and insulin doses and developing a plan to avoid hypo- and hyper-glycemia.    Patient is asked to bring meter and  blood glucose logs during  her next visit.   - I advised patient to maintain close follow up with Glenda Chroman, MD for primary care needs.  Follow up plan: Return in about 3 months (around 01/27/2017) for meter, and logs.  Glade Lloyd, MD Phone: [phone]  Fax: [phone]  -  This note was partially dictated with voice recognition software. Similar sounding words can be transcribed inadequately or may not  be corrected upon review.  10/27/2016, 12:18 PM

## 2016-10-31 DIAGNOSIS — I2510 Atherosclerotic heart disease of native coronary artery without angina pectoris: Secondary | ICD-10-CM | POA: Diagnosis not present

## 2016-10-31 DIAGNOSIS — I130 Hypertensive heart and chronic kidney disease with heart failure and stage 1 through stage 4 chronic kidney disease, or unspecified chronic kidney disease: Secondary | ICD-10-CM | POA: Diagnosis not present

## 2016-10-31 DIAGNOSIS — E1122 Type 2 diabetes mellitus with diabetic chronic kidney disease: Secondary | ICD-10-CM | POA: Diagnosis not present

## 2016-10-31 DIAGNOSIS — E1151 Type 2 diabetes mellitus with diabetic peripheral angiopathy without gangrene: Secondary | ICD-10-CM | POA: Diagnosis not present

## 2016-10-31 DIAGNOSIS — I5033 Acute on chronic diastolic (congestive) heart failure: Secondary | ICD-10-CM | POA: Diagnosis not present

## 2016-10-31 DIAGNOSIS — N183 Chronic kidney disease, stage 3 (moderate): Secondary | ICD-10-CM | POA: Diagnosis not present

## 2016-11-01 DIAGNOSIS — I2510 Atherosclerotic heart disease of native coronary artery without angina pectoris: Secondary | ICD-10-CM | POA: Diagnosis not present

## 2016-11-01 DIAGNOSIS — N183 Chronic kidney disease, stage 3 (moderate): Secondary | ICD-10-CM | POA: Diagnosis not present

## 2016-11-01 DIAGNOSIS — I130 Hypertensive heart and chronic kidney disease with heart failure and stage 1 through stage 4 chronic kidney disease, or unspecified chronic kidney disease: Secondary | ICD-10-CM | POA: Diagnosis not present

## 2016-11-01 DIAGNOSIS — I5033 Acute on chronic diastolic (congestive) heart failure: Secondary | ICD-10-CM | POA: Diagnosis not present

## 2016-11-01 DIAGNOSIS — E1122 Type 2 diabetes mellitus with diabetic chronic kidney disease: Secondary | ICD-10-CM | POA: Diagnosis not present

## 2016-11-01 DIAGNOSIS — E1151 Type 2 diabetes mellitus with diabetic peripheral angiopathy without gangrene: Secondary | ICD-10-CM | POA: Diagnosis not present

## 2016-11-07 DIAGNOSIS — I5033 Acute on chronic diastolic (congestive) heart failure: Secondary | ICD-10-CM | POA: Diagnosis not present

## 2016-11-07 DIAGNOSIS — I2510 Atherosclerotic heart disease of native coronary artery without angina pectoris: Secondary | ICD-10-CM | POA: Diagnosis not present

## 2016-11-07 DIAGNOSIS — N183 Chronic kidney disease, stage 3 (moderate): Secondary | ICD-10-CM | POA: Diagnosis not present

## 2016-11-07 DIAGNOSIS — E1151 Type 2 diabetes mellitus with diabetic peripheral angiopathy without gangrene: Secondary | ICD-10-CM | POA: Diagnosis not present

## 2016-11-07 DIAGNOSIS — I130 Hypertensive heart and chronic kidney disease with heart failure and stage 1 through stage 4 chronic kidney disease, or unspecified chronic kidney disease: Secondary | ICD-10-CM | POA: Diagnosis not present

## 2016-11-07 DIAGNOSIS — E1122 Type 2 diabetes mellitus with diabetic chronic kidney disease: Secondary | ICD-10-CM | POA: Diagnosis not present

## 2016-11-13 DIAGNOSIS — E1151 Type 2 diabetes mellitus with diabetic peripheral angiopathy without gangrene: Secondary | ICD-10-CM | POA: Diagnosis not present

## 2016-11-13 DIAGNOSIS — N183 Chronic kidney disease, stage 3 (moderate): Secondary | ICD-10-CM | POA: Diagnosis not present

## 2016-11-13 DIAGNOSIS — I130 Hypertensive heart and chronic kidney disease with heart failure and stage 1 through stage 4 chronic kidney disease, or unspecified chronic kidney disease: Secondary | ICD-10-CM | POA: Diagnosis not present

## 2016-11-13 DIAGNOSIS — E1122 Type 2 diabetes mellitus with diabetic chronic kidney disease: Secondary | ICD-10-CM | POA: Diagnosis not present

## 2016-11-13 DIAGNOSIS — I2510 Atherosclerotic heart disease of native coronary artery without angina pectoris: Secondary | ICD-10-CM | POA: Diagnosis not present

## 2016-11-13 DIAGNOSIS — I5033 Acute on chronic diastolic (congestive) heart failure: Secondary | ICD-10-CM | POA: Diagnosis not present

## 2016-11-21 ENCOUNTER — Other Ambulatory Visit: Payer: Self-pay

## 2016-11-21 ENCOUNTER — Encounter (HOSPITAL_COMMUNITY): Payer: Self-pay | Admitting: Cardiology

## 2016-11-21 ENCOUNTER — Ambulatory Visit (HOSPITAL_COMMUNITY)
Admission: RE | Admit: 2016-11-21 | Discharge: 2016-11-21 | Disposition: A | Discharge status: Home / Self Care | Payer: Medicare Other | Source: Ambulatory Visit | Attending: Cardiology | Admitting: Cardiology

## 2016-11-21 VITALS — BP 150/49 | HR 61 | Wt 184.8 lb

## 2016-11-21 DIAGNOSIS — N183 Chronic kidney disease, stage 3 unspecified: Secondary | ICD-10-CM

## 2016-11-21 DIAGNOSIS — I2510 Atherosclerotic heart disease of native coronary artery without angina pectoris: Secondary | ICD-10-CM | POA: Diagnosis not present

## 2016-11-21 DIAGNOSIS — J9611 Chronic respiratory failure with hypoxia: Secondary | ICD-10-CM

## 2016-11-21 DIAGNOSIS — Z833 Family history of diabetes mellitus: Secondary | ICD-10-CM | POA: Diagnosis not present

## 2016-11-21 DIAGNOSIS — E0590 Thyrotoxicosis, unspecified without thyrotoxic crisis or storm: Secondary | ICD-10-CM | POA: Diagnosis not present

## 2016-11-21 DIAGNOSIS — Z8249 Family history of ischemic heart disease and other diseases of the circulatory system: Secondary | ICD-10-CM | POA: Insufficient documentation

## 2016-11-21 DIAGNOSIS — I739 Peripheral vascular disease, unspecified: Secondary | ICD-10-CM

## 2016-11-21 DIAGNOSIS — I5032 Chronic diastolic (congestive) heart failure: Secondary | ICD-10-CM | POA: Diagnosis not present

## 2016-11-21 DIAGNOSIS — E785 Hyperlipidemia, unspecified: Secondary | ICD-10-CM | POA: Diagnosis not present

## 2016-11-21 DIAGNOSIS — I5042 Chronic combined systolic (congestive) and diastolic (congestive) heart failure: Secondary | ICD-10-CM

## 2016-11-21 DIAGNOSIS — E1122 Type 2 diabetes mellitus with diabetic chronic kidney disease: Secondary | ICD-10-CM | POA: Insufficient documentation

## 2016-11-21 DIAGNOSIS — Z87891 Personal history of nicotine dependence: Secondary | ICD-10-CM | POA: Diagnosis not present

## 2016-11-21 DIAGNOSIS — Z801 Family history of malignant neoplasm of trachea, bronchus and lung: Secondary | ICD-10-CM | POA: Diagnosis not present

## 2016-11-21 DIAGNOSIS — I130 Hypertensive heart and chronic kidney disease with heart failure and stage 1 through stage 4 chronic kidney disease, or unspecified chronic kidney disease: Secondary | ICD-10-CM | POA: Insufficient documentation

## 2016-11-21 DIAGNOSIS — Z951 Presence of aortocoronary bypass graft: Secondary | ICD-10-CM | POA: Diagnosis not present

## 2016-11-21 DIAGNOSIS — Z7902 Long term (current) use of antithrombotics/antiplatelets: Secondary | ICD-10-CM | POA: Diagnosis not present

## 2016-11-21 DIAGNOSIS — I272 Pulmonary hypertension, unspecified: Secondary | ICD-10-CM | POA: Diagnosis not present

## 2016-11-21 DIAGNOSIS — Z7982 Long term (current) use of aspirin: Secondary | ICD-10-CM | POA: Diagnosis not present

## 2016-11-21 DIAGNOSIS — Z794 Long term (current) use of insulin: Secondary | ICD-10-CM | POA: Diagnosis not present

## 2016-11-21 DIAGNOSIS — E1151 Type 2 diabetes mellitus with diabetic peripheral angiopathy without gangrene: Secondary | ICD-10-CM | POA: Diagnosis not present

## 2016-11-21 DIAGNOSIS — I5033 Acute on chronic diastolic (congestive) heart failure: Secondary | ICD-10-CM | POA: Diagnosis not present

## 2016-11-21 DIAGNOSIS — Z79899 Other long term (current) drug therapy: Secondary | ICD-10-CM | POA: Diagnosis not present

## 2016-11-21 LAB — BASIC METABOLIC PANEL
ANION GAP: 8 (ref 5–15)
BUN: 70 mg/dL — ABNORMAL HIGH (ref 6–20)
CO2: 29 mmol/L (ref 22–32)
Calcium: 9.3 mg/dL (ref 8.9–10.3)
Chloride: 96 mmol/L — ABNORMAL LOW (ref 101–111)
Creatinine, Ser: 1.5 mg/dL — ABNORMAL HIGH (ref 0.44–1.00)
GFR calc Af Amer: 40 mL/min — ABNORMAL LOW (ref >60)
GFR, EST NON AFRICAN AMERICAN: 34 mL/min — AB (ref >60)
Glucose, Bld: 204 mg/dL — ABNORMAL HIGH (ref 65–99)
POTASSIUM: 3.7 mmol/L (ref 3.5–5.1)
SODIUM: 133 mmol/L — AB (ref 135–145)

## 2016-11-21 LAB — LIPID PANEL
CHOL/HDL RATIO: 3 ratio
CHOLESTEROL: 122 mg/dL (ref 0–200)
HDL: 41 mg/dL (ref >40)
LDL Cholesterol: 58 mg/dL (ref 0–99)
Triglycerides: 113 mg/dL (ref <150)
VLDL: 23 mg/dL (ref 0–40)

## 2016-11-21 NOTE — Patient Instructions (Signed)
Labs drawn today (if we do not call you, then your lab work was stable)   You have been referred to [facility] (they will call you)   Your physician recommends that you schedule a follow-up appointment in: 3 months with Dr. Aundra Dubin

## 2016-11-22 NOTE — Progress Notes (Signed)
Advanced Heart Failure Clinic Note   PCP: Dr. Woody Seller Cardiology: Dr. Domenic Polite HF Cardiology: Dr. Aundra Dubin  71 yo with history of chronic diastolic CHF, CAD s/p CABG, chronic right pleural effusion, CKD stage III, and PAD. She has had multiple admissions with diastolic CHF complicated by CKD.  She was admitted in 7/17 for diuresis.  Worsening renal function was noted.  Lisinopril was stopped with elevated creatinine and Coreg was stopped with bradycardia. Echo in 7/17 showed EF 55-60% with grade II diastolic dysfunction and D-shaped interventricular septum.  She has been wearing home oxygen for > 1 year.    She was admitted again in 9/17 with volume overload.  She was diuresed extensively.  Pleurx catheter was removed from right chest.  RHC showed elevated right and left heart filling pressure with pulmonary venous hypertension.  She was diagnosed with hyperthyroidism and started on methimazole.  She was seen by Dr Dorris Fetch (endocrinology) and thought to have subacute thyroiditis rather than Graves disease.  She is now off methimazole and thyroid indices have normalized.   After discharge, in 10/17 she had peripheral angiography PV => right SFA and right tibioperoneal trunk angioplasties.  She now has only rare claudication.     Echo in 06/2016 with EF 55-60%, grade 2 dd, PAP 60m Hg.   Admitted 07/28/16-08/03/16 with acute on chronic diastolic CHF. Diuresed with IV lasix 60 mg BID and metolazone. Hospital course complicated by chronic hip and leg pain, gabapentin was restarted with improvement of symptoms. Had some urinary retention, discharged to SNF with foley. Discharge weight was 193 pounds.   She returns today for followup of CHF.  She is now at home, living with grand-daughter.  No dyspnea walking on flat ground with walker.  No orthopnea/PND.  No chest pain.  No lightheadedness or falls.  She wears oxygen at all times.  Weight is down 4 lbs.    Labs (7/17): K 4.2, creatinine 2.04 Labs (10/17): K  4.5, creatinine 1.68, HCT 30.5 Labs (11/17): K 3.9, creatinine 1.78, LDL 36 Labs (12/17): K 3.9, creatinine 1.49 => 1.53 Labs (9/18): K 4.9, creatinine 1.62, TSH normal  ECG (personally reviewed): NSR, right axis deviation.   PMH: 1. Chronic diastolic CHF: Multiple admissions.  - Echo (7/17) with EF 55-60%, grade II diastolic dysfunction, D-shaped interventricular septum, mild MR, PASP 52 mmHg.  - Uses home oxygen.  - RHC (9/17): mean 15, PA 73/26 mean 42, mean PCWP 25, CI 2.84, PVR 3.0 WU => pulmonary venous hypertension.  - Echo (6/18): EF 55-60%, PASP 84 mm Hg.  2. H/o CVA 3. Recurrent right pleural effusion: S/p Pleurx catheter placement.  4. Depression 5. HTN 6. Carotid stenosis: Bilateral CEAs 7. Type II diabetes.  8. CAD: NSTEMI 11/12, had CABG with LIMA-LAD, SVG-PLV, SVG-OM, SVG-D.   - LHC (9/15) with DES to SVG-D.  Other grafts patent.  9. PAD: Left femoral-popliteal bypass 9/15.  - peripheral arterial dopplers (2/17) with right ABI 0.53, left ABI 1.02.  - 10/17 right SFA atherectomy and right tibioperoneal trunk atherectomy.  10. CKD stage III. Lisinopril stopped with increased creatinine.  11. Bradycardia: Stopped Coreg.  12. Hyperthyroidism: Diagnosed in 9/17. Subacute thyroiditis.  Followed by Dr. NDorris Fetch  13. Sleep study 12/17 without significant OSA.   [place].  Prior [place]oker, quit 2012.  Retired.   Family History  Problem Relation Age of Onset  . Coronary artery disease Father        Died with MI age 69 .  Heart disease Father        before age 53  . Heart attack Father   . Diabetes type II Mother   . Hypertension Mother   . Diabetes Mother   . Heart disease Mother   . Hyperlipidemia Mother   . Heart failure Sister   . Cancer Sister   . Cancer Brother        Lung cancer  . Diabetes Daughter   . Hyperlipidemia Daughter   . Diabetes Son   . Hyperlipidemia Son    Review of systems complete and found to be negative unless listed in HPI.     BP (!) 150/49   Pulse 61   Wt 184 lb 12 oz (83.8 kg)   SpO2 100% Comment: on 2L of O2  BMI 29.82 kg/m    Wt Readings from Last 3 Encounters:  11/21/16 184 lb 12 oz (83.8 kg)  10/27/16 198 lb (89.8 kg)  09/25/16 200 lb 12.8 oz (91.1 kg)    Current Outpatient Medications on File Prior to Encounter  Medication Sig Dispense Refill  . acetaminophen (TYLENOL) 500 MG tablet Take 500-1,000 mg by mouth every 6 (six) hours as needed for mild pain or moderate pain.     [name] albuterol (PROVENTIL HFA;VENTOLIN HFA) 108 (90 BASE) MCG/ACT inhaler Inhale 2 puffs into the lungs daily as needed for wheezing or shortness of breath.     [name] amLODipine (NORVASC) 10 MG tablet Take 1 tablet (10 mg total) by mouth daily. 90 tablet 3  . aspirin 81 MG EC tablet Take 81 mg by mouth daily.    [name] atorvastatin (LIPITOR) 40 MG tablet Take 1 tablet (40 mg total) by mouth daily at 6 PM. 90 tablet 3  . citalopram (CELEXA) 40 MG tablet Take 40 mg by mouth every morning.     . clopidogrel (PLAVIX) 75 MG tablet TAKE ONE TABLET BY MOUTH ONCE DAILY WITH  BREAKFAST 90 tablet 3  . clotrimazole-betamethasone (LOTRISONE) cream Apply 1 application topically 2 (two) times daily as needed.    Mariane Baumgarten Calcium (STOOL SOFTENER PO) Take 200 mg by mouth at bedtime as needed (for constipation).     . gabapentin (NEURONTIN) 300 MG capsule Take 300-600 mg by mouth See admin instructions. 300 mg in the morning and 600 mg prior to bed(time)    . hydrALAZINE (APRESOLINE) 25 MG tablet Take 3 tablets (75 mg total) by mouth every 8 (eight) hours. 270 tablet 3  . insulin detemir (LEVEMIR) 100 UNIT/ML injection Inject 10 Units into the skin daily with breakfast.    . isosorbide mononitrate (IMDUR) 30 MG 24 hr tablet Take 1 tablet (30 mg total) by mouth daily. 90 tablet 3  . levothyroxine (SYNTHROID, LEVOTHROID) 88 MCG tablet Take 1 tablet (88 mcg total) by mouth daily before breakfast. 30 tablet 3  . losartan (COZAAR) 25 MG tablet Take 0.5 tablets  (12.5 mg total) by mouth daily. 45 tablet 3  . Multiple Vitamin (MULTIVITAMIN WITH MINERALS) TABS tablet Take 1 tablet by mouth daily.    . nitroGLYCERIN (NITROSTAT) 0.4 MG SL tablet Place 1 tablet (0.4 mg total) under the tongue every 5 (five) minutes as needed for chest pain. Up to 3 doses. If no relief after 3rd dose, proceed to the ED for an evaluation 25 tablet 3  . nystatin (MYCOSTATIN/NYSTOP) powder Apply 1 application topically 2 (two) times daily as needed.    . potassium chloride SA (K-DUR,KLOR-CON) 20 MEQ tablet Take 1 tablet (20 mEq  total) by mouth 2 (two) times daily. 180 tablet 3  . sitaGLIPtin (JANUVIA) 25 MG tablet Take 1 tablet (25 mg total) by mouth daily. 30 tablet 3  . spironolactone (ALDACTONE) 25 MG tablet Take 0.5 tablets (12.5 mg total) by mouth daily. 45 tablet 3  . tiZANidine (ZANAFLEX) 4 MG tablet Take 4 mg by mouth 2 (two) times daily as needed for muscle spasms.    [name] torsemide (DEMADEX) 20 MG tablet Take 3 tablets (60 mg total) by mouth 2 (two) times daily. 180 tablet 10  . traMADol (ULTRAM) 50 MG tablet Take 50 mg by mouth.    . umeclidinium-vilanterol (ANORO ELLIPTA) 62.5-25 MCG/INH AEPB Inhale 1 puff into the lungs daily.     No current facility-administered medications on file prior to encounter.    General: NAD Neck: No JVD, no thyromegaly or thyroid nodule.  Lungs: Clear to auscultation bilaterally with normal respiratory effort. CV: Nondisplaced PMI.  Heart regular S1/S2, no S3/S4, 1/6 SEM RUSB.  No peripheral edema.  Bilateral carotid bruits.  Normal pedal pulses.  Abdomen: Soft, nontender, no hepatosplenomegaly, no distention.  Skin: Intact without lesions or rashes.  Neurologic: Alert and oriented x 3.  Psych: Normal affect. Extremities: No clubbing or cyanosis.  HEENT: Normal.   Assessment/plan: 1. Chronic diastolic CHF: Echo (2/33) with EF 55-60%. NYHA class II symptoms but not very active.  Wears home oxygen.  On exam, she is not volume overloaded  today and weight is down.   - No beta blocker with history of bradycardia.  - Continue torsemide 60 mg BID. BMET today. - Continue spironolactone 12.5 mg daily, there is some data for benefit in diastolic CHF.  - We discussed Cardiomems placement.  I think she would be a good candidate.  We will see if we can get it approved with her insurance.  2. Pulmonary HTN: Right heart cath in Sept. 2017 with PA 73/26 mean 42. PVR 3.0 WU.  Primarily pulmonary venous hypertension though chronic hypoxemic respiratory failure also likely plays a role.  - Continue diuresis and home oxygen.  - Sleep study negative for OSA.  - Normal PFT's before CABG in 2012 3. CKD stage III:  BMET today.  4. CAD: s/p CABG in 2012, history of PCI to SVG to diagonal In 09/2013.  Denies chest pain.  - Continue ASA and Plavix.  5. Recurrent right pleural effusion: Stable, lung sounds clear.  6. PAD: Followed at [facility].  No significant claudication currently though not very active.  7. Hyperlipidemia: Check lipids today.    8. Hyperthyroidism: Follows with Dr. Dorris Fetch.  9. HTN: BP mildly elevated today, will follow.   10. Diabetes: Given CHF, empagliflozin may be a better choice than sitagliptin.   Follow up in 3 months   Loralie Champagne, MD  11/22/2016

## 2016-11-27 DIAGNOSIS — I130 Hypertensive heart and chronic kidney disease with heart failure and stage 1 through stage 4 chronic kidney disease, or unspecified chronic kidney disease: Secondary | ICD-10-CM | POA: Diagnosis not present

## 2016-11-27 DIAGNOSIS — I2510 Atherosclerotic heart disease of native coronary artery without angina pectoris: Secondary | ICD-10-CM | POA: Diagnosis not present

## 2016-11-27 DIAGNOSIS — E1122 Type 2 diabetes mellitus with diabetic chronic kidney disease: Secondary | ICD-10-CM | POA: Diagnosis not present

## 2016-11-27 DIAGNOSIS — N183 Chronic kidney disease, stage 3 (moderate): Secondary | ICD-10-CM | POA: Diagnosis not present

## 2016-11-27 DIAGNOSIS — I5033 Acute on chronic diastolic (congestive) heart failure: Secondary | ICD-10-CM | POA: Diagnosis not present

## 2016-11-27 DIAGNOSIS — E1151 Type 2 diabetes mellitus with diabetic peripheral angiopathy without gangrene: Secondary | ICD-10-CM | POA: Diagnosis not present

## 2016-12-05 DIAGNOSIS — I130 Hypertensive heart and chronic kidney disease with heart failure and stage 1 through stage 4 chronic kidney disease, or unspecified chronic kidney disease: Secondary | ICD-10-CM | POA: Diagnosis not present

## 2016-12-05 DIAGNOSIS — I5033 Acute on chronic diastolic (congestive) heart failure: Secondary | ICD-10-CM | POA: Diagnosis not present

## 2016-12-05 DIAGNOSIS — I2510 Atherosclerotic heart disease of native coronary artery without angina pectoris: Secondary | ICD-10-CM | POA: Diagnosis not present

## 2016-12-05 DIAGNOSIS — E1122 Type 2 diabetes mellitus with diabetic chronic kidney disease: Secondary | ICD-10-CM | POA: Diagnosis not present

## 2016-12-05 DIAGNOSIS — N183 Chronic kidney disease, stage 3 (moderate): Secondary | ICD-10-CM | POA: Diagnosis not present

## 2016-12-05 DIAGNOSIS — E1151 Type 2 diabetes mellitus with diabetic peripheral angiopathy without gangrene: Secondary | ICD-10-CM | POA: Diagnosis not present

## 2016-12-12 ENCOUNTER — Encounter (HOSPITAL_COMMUNITY): Payer: Self-pay

## 2016-12-12 DIAGNOSIS — E1151 Type 2 diabetes mellitus with diabetic peripheral angiopathy without gangrene: Secondary | ICD-10-CM | POA: Diagnosis not present

## 2016-12-12 DIAGNOSIS — N183 Chronic kidney disease, stage 3 (moderate): Secondary | ICD-10-CM | POA: Diagnosis not present

## 2016-12-12 DIAGNOSIS — I130 Hypertensive heart and chronic kidney disease with heart failure and stage 1 through stage 4 chronic kidney disease, or unspecified chronic kidney disease: Secondary | ICD-10-CM | POA: Diagnosis not present

## 2016-12-12 DIAGNOSIS — I5033 Acute on chronic diastolic (congestive) heart failure: Secondary | ICD-10-CM | POA: Diagnosis not present

## 2016-12-12 DIAGNOSIS — E1122 Type 2 diabetes mellitus with diabetic chronic kidney disease: Secondary | ICD-10-CM | POA: Diagnosis not present

## 2016-12-12 DIAGNOSIS — I2510 Atherosclerotic heart disease of native coronary artery without angina pectoris: Secondary | ICD-10-CM | POA: Diagnosis not present

## 2016-12-20 DIAGNOSIS — I5033 Acute on chronic diastolic (congestive) heart failure: Secondary | ICD-10-CM | POA: Diagnosis not present

## 2016-12-20 DIAGNOSIS — I130 Hypertensive heart and chronic kidney disease with heart failure and stage 1 through stage 4 chronic kidney disease, or unspecified chronic kidney disease: Secondary | ICD-10-CM | POA: Diagnosis not present

## 2016-12-20 DIAGNOSIS — E1151 Type 2 diabetes mellitus with diabetic peripheral angiopathy without gangrene: Secondary | ICD-10-CM | POA: Diagnosis not present

## 2016-12-20 DIAGNOSIS — I2510 Atherosclerotic heart disease of native coronary artery without angina pectoris: Secondary | ICD-10-CM | POA: Diagnosis not present

## 2016-12-20 DIAGNOSIS — N183 Chronic kidney disease, stage 3 (moderate): Secondary | ICD-10-CM | POA: Diagnosis not present

## 2016-12-20 DIAGNOSIS — E1122 Type 2 diabetes mellitus with diabetic chronic kidney disease: Secondary | ICD-10-CM | POA: Diagnosis not present

## 2016-12-22 ENCOUNTER — Ambulatory Visit: Payer: Medicare Other | Admitting: Cardiology

## 2017-01-05 DIAGNOSIS — E1142 Type 2 diabetes mellitus with diabetic polyneuropathy: Secondary | ICD-10-CM | POA: Diagnosis not present

## 2017-01-05 DIAGNOSIS — Z299 Encounter for prophylactic measures, unspecified: Secondary | ICD-10-CM | POA: Diagnosis not present

## 2017-01-05 DIAGNOSIS — I509 Heart failure, unspecified: Secondary | ICD-10-CM | POA: Diagnosis not present

## 2017-01-05 DIAGNOSIS — E039 Hypothyroidism, unspecified: Secondary | ICD-10-CM | POA: Diagnosis not present

## 2017-01-05 DIAGNOSIS — M5440 Lumbago with sciatica, unspecified side: Secondary | ICD-10-CM | POA: Diagnosis not present

## 2017-01-05 DIAGNOSIS — Z6831 Body mass index (BMI) 31.0-31.9, adult: Secondary | ICD-10-CM | POA: Diagnosis not present

## 2017-01-05 DIAGNOSIS — E1165 Type 2 diabetes mellitus with hyperglycemia: Secondary | ICD-10-CM | POA: Diagnosis not present

## 2017-01-05 DIAGNOSIS — Z79899 Other long term (current) drug therapy: Secondary | ICD-10-CM | POA: Diagnosis not present

## 2017-01-12 ENCOUNTER — Ambulatory Visit: Payer: Medicare Other | Admitting: Cardiology

## 2017-01-12 NOTE — Progress Notes (Deleted)
Cardiology Office Note  Date: 01/12/2017   ID: [name] [name], DOB 1945/09/24, MRN [id number]  PCP: Glenda Chroman, MD  Primary Cardiologist: Rozann Lesches, MD   No chief complaint on file.   History of Present Illness: [name] [name] is a 72 y.o. female seen most recently by Dr. Aundra Dubin in the heart failure clinic in November 2018. At that time she was continued on Demadex 60 mg twice daily as well as spironolactone.  Past Medical History:  Diagnosis Date  . Arthritis   . Carotid artery disease (HCC)    a. Bilateral CEA; 50-60% bilateral ICA stenosis, 11/12  . Cellulitis    a. Recurrent, bilateral legs  . CHF (congestive heart failure) ([place])   . Chronic back pain   . Chronic diastolic heart failure ([place])   . Coronary atherosclerosis of native coronary artery    a. 11/2010 NSTEMI/CABG x 4: L-LAD; S-PL; S-OM; S-DX, by PVT.  [name] Critical lower limb ischemia   . Depression   . Essential hypertension, benign   . Herniated disc   . History of blood transfusion   . Hypothyroidism   . Ischemic cardiomyopathy    a. 11/2010 TEE: EF 40-45%.  . Migraine   . Mixed hyperlipidemia   . NSTEMI (non-ST elevated myocardial infarction) ([place]) 11/2010  . On home oxygen therapy   . PAD (peripheral artery disease) ([place])   . Pleural effusion   . Pneumonia 2000's X 2  . Stroke ([place]) 08/2009  . Suicide attempt ([place]) 08/2002  . TIA (transient ischemic attack)   . TMJ syndrome   . Type 2 diabetes mellitus ([place])   . Venous insufficiency     Past Surgical History:  Procedure Laterality Date  . ABDOMINAL HYSTERECTOMY    . APPENDECTOMY    . CARDIAC CATHETERIZATION  11/2010  . CARDIAC CATHETERIZATION N/A 09/16/2015   Procedure: Right Heart Cath;  Surgeon: Larey Dresser, MD;  Location: [facility];  Service: Cardiovascular;  Laterality: N/A;  . CAROTID ENDARTERECTOMY Bilateral 2011   Bilateral - Dr. Kellie Simmering  . CHOLECYSTECTOMY    . COLONOSCOPY W/ BIOPSIES AND POLYPECTOMY    . CORONARY  ANGIOPLASTY WITH STENT PLACEMENT  03/2013   "1"  . CORONARY ARTERY BYPASS GRAFT  11/24/2010   Procedure: CORONARY ARTERY BYPASS GRAFTING (CABG);  Surgeon: Tharon Aquas Adelene Idler, MD;  Location: [facility];  Service: Open Heart Surgery;  Laterality: N/A;  Coronary Artery Bypass Graft times four on pump utilizing left internal mammary artery and bilateral greater saphenous veins harvested endoscopically, transesophageal echocardiogram   . DEBRIDEMENT TOE Left    "nonhealing wound; 3rd digit  . DILATION AND CURETTAGE OF UTERUS    . ENDARTERECTOMY POPLITEAL Left 10/07/2013   Procedure: LEFT POPLITEAL ENDARTERECTOMY ;  Surgeon: Angelia Mould, MD;  Location: [facility];  Service: Vascular;  Laterality: Left;  . FEMORAL-POPLITEAL BYPASS GRAFT Left 10/07/2013   Procedure: LEFT FEMORAL-POPLITEAL ARTERY BYPASS GRAFT;  Surgeon: Angelia Mould, MD;  Location: [facility];  Service: Vascular;  Laterality: Left;  . FRACTURE SURGERY    . INTRAOPERATIVE ARTERIOGRAM Left 10/07/2013   Procedure: INTRA OPERATIVE ARTERIOGRAM LEFT LEG;  Surgeon: Angelia Mould, MD;  Location: [facility];  Service: Vascular;  Laterality: Left;  . IR GENERIC HISTORICAL  09/21/2015   IR REMOVAL OF PLURAL CATH W/CUFF 09/21/2015 Marybelle Killings, MD [facility]-INTER[facility] RAD  . LEFT AND RIGHT HEART CATHETERIZATION WITH CORONARY/GRAFT ANGIOGRAM N/A 03/27/2013   Procedure: LEFT AND RIGHT HEART  CATHETERIZATION WITH Beatrix Fetters;  Surgeon: Burnell Blanks, MD;  Location: [facility] C[facility] LAB;  Service: Cardiovascular;  Laterality: N/A;  . LOWER EXTREMITY ANGIOGRAM  09/08/2012; 10/06/2013   "found 100% blockage; unsuccessful attempt at crossing a chronic total occlusion of the left SFA in the setting of critical limb ischemia  . LOWER EXTREMITY ANGIOGRAM Bilateral 09/08/2013   Procedure: LOWER EXTREMITY ANGIOGRAM;  Surgeon: [name], MD;  Location: [facility] CATH LAB;  Service: Cardiovascular;  Laterality: Bilateral;  . PERIPHERAL VASCULAR CATHETERIZATION   11/04/2015   Procedure: Peripheral Vascular Atherectomy;  Surgeon: [name], MD;  Location: [facility];  Service: Cardiovascular;;  RT. SFA  . PERIPHERAL VASCULAR CATHETERIZATION Bilateral 11/04/2015   Procedure: Lower Extremity Intervention;  Surgeon: [name], MD;  Location: [facility];  Service: Cardiovascular;  Laterality: Bilateral;  . PERIPHERAL VASCULAR CATHETERIZATION  11/04/2015   Procedure: Peripheral Vascular Balloon Angioplasty;  Surgeon: [name], MD;  Location: [facility];  Service: Cardiovascular;;  TP Trunk  . TOE SURGERY Left    "put pin in 2nd toe"  . TUBAL LIGATION    . WRIST FRACTURE SURGERY Left    "grafted bone from hip to wrist"    Current Outpatient Medications  Medication Sig Dispense Refill  . acetaminophen (TYLENOL) 500 MG tablet Take 500-1,000 mg by mouth every 6 (six) hours as needed for mild pain or moderate pain.     [name] albuterol (PROVENTIL HFA;VENTOLIN HFA) 108 (90 BASE) MCG/ACT inhaler Inhale 2 puffs into the lungs daily as needed for wheezing or shortness of breath.     [name] amLODipine (NORVASC) 10 MG tablet Take 1 tablet (10 mg total) by mouth daily. 90 tablet 3  . aspirin 81 MG EC tablet Take 81 mg by mouth daily.    [name] atorvastatin (LIPITOR) 40 MG tablet Take 1 tablet (40 mg total) by mouth daily at 6 PM. 90 tablet 3  . citalopram (CELEXA) 40 MG tablet Take 40 mg by mouth every morning.     . clopidogrel (PLAVIX) 75 MG tablet TAKE ONE TABLET BY MOUTH ONCE DAILY WITH  BREAKFAST 90 tablet 3  . clotrimazole-betamethasone (LOTRISONE) cream Apply 1 application topically 2 (two) times daily as needed.    Mariane Baumgarten Calcium (STOOL SOFTENER PO) Take 200 mg by mouth at bedtime as needed (for constipation).     . gabapentin (NEURONTIN) 300 MG capsule Take 300-600 mg by mouth See admin instructions. 300 mg in the morning and 600 mg prior to bed(time)    . hydrALAZINE (APRESOLINE) 25 MG tablet Take 3 tablets (75 mg total) by  mouth every 8 (eight) hours. 270 tablet 3  . insulin detemir (LEVEMIR) 100 UNIT/ML injection Inject 10 Units into the skin daily with breakfast.    . isosorbide mononitrate (IMDUR) 30 MG 24 hr tablet Take 1 tablet (30 mg total) by mouth daily. 90 tablet 3  . levothyroxine (SYNTHROID, LEVOTHROID) 88 MCG tablet Take 1 tablet (88 mcg total) by mouth daily before breakfast. 30 tablet 3  . losartan (COZAAR) 25 MG tablet Take 0.5 tablets (12.5 mg total) by mouth daily. 45 tablet 3  . Multiple Vitamin (MULTIVITAMIN WITH MINERALS) TABS tablet Take 1 tablet by mouth daily.    . nitroGLYCERIN (NITROSTAT) 0.4 MG SL tablet Place 1 tablet (0.4 mg total) under the tongue every 5 (five) minutes as needed for chest pain. Up to 3 doses. If no relief after 3rd dose, proceed to the ED  for an evaluation 25 tablet 3  . nystatin (MYCOSTATIN/NYSTOP) powder Apply 1 application topically 2 (two) times daily as needed.    . potassium chloride SA (K-DUR,KLOR-CON) 20 MEQ tablet Take 1 tablet (20 mEq total) by mouth 2 (two) times daily. 180 tablet 3  . sitaGLIPtin (JANUVIA) 25 MG tablet Take 1 tablet (25 mg total) by mouth daily. 30 tablet 3  . spironolactone (ALDACTONE) 25 MG tablet Take 0.5 tablets (12.5 mg total) by mouth daily. 45 tablet 3  . tiZANidine (ZANAFLEX) 4 MG tablet Take 4 mg by mouth 2 (two) times daily as needed for muscle spasms.    [name] torsemide (DEMADEX) 20 MG tablet Take 3 tablets (60 mg total) by mouth 2 (two) times daily. 180 tablet 10  . traMADol (ULTRAM) 50 MG tablet Take 50 mg by mouth.    . umeclidinium-vilanterol (ANORO ELLIPTA) 62.5-25 MCG/INH AEPB Inhale 1 puff into the lungs daily.     No current facility-administered medications for this visit.    Allergies:  Other; Strawberry extract; Sulfa antibiotics; Coffee bean extract [coffea arabica]; and Tape   Social History: The patient  reports that she quit smoking about 7 years ago. Her smoking use included cigarettes. She started smoking about 61  years ago. She has a 150.00 pack-year smoking history. she has never used smokeless tobacco. She reports that she drinks alcohol. She reports that she uses drugs. Drug: Marijuana.   Family History: The patient's family history includes Cancer in her brother and sister; Coronary artery disease in her father; Diabetes in her daughter, mother, and son; Diabetes type II in her mother; Heart attack in her father; Heart disease in her father and mother; Heart failure in her sister; Hyperlipidemia in her daughter, mother, and son; Hypertension in her mother.   ROS:  Please see the history of present illness. Otherwise, complete review of systems is positive for {NONE DEFAULTED:18576::"none"}.  All other systems are reviewed and negative.   Physical Exam: VS:  There were no vitals taken for this visit., BMI There is no height or weight on file to calculate BMI.  Wt Readings from Last 3 Encounters:  11/21/16 184 lb 12 oz (83.8 kg)  10/27/16 198 lb (89.8 kg)  09/25/16 200 lb 12.8 oz (91.1 kg)    General: Patient appears comfortable at rest. HEENT: Conjunctiva and lids normal, oropharynx clear with moist mucosa. Neck: Supple, no elevated JVP or carotid bruits, no thyromegaly. Lungs: Clear to auscultation, nonlabored breathing at rest. Cardiac: Regular rate and rhythm, no S3 or significant systolic murmur, no pericardial rub. Abdomen: Soft, nontender, no hepatomegaly, bowel sounds present, no guarding or rebound. Extremities: No pitting edema, distal pulses 2+. Skin: Warm and dry. Musculoskeletal: No kyphosis. Neuropsychiatric: Alert and oriented x3, affect grossly appropriate.  ECG: I personally reviewed the tracing from 11/21/2016 which showed sinus rhythm with poor anteroseptal R-wave progression.  Recent Labwork: 07/28/2016: ALT 15; AST 20; B Natriuretic Peptide 643.2 07/31/2016: Magnesium 2.3 10/02/2016: Hemoglobin 9.9; Platelets 241; TSH 1.98 11/21/2016: BUN 70; Creatinine, Ser 1.50; Potassium  3.7; Sodium 133     Component Value Date/Time   CHOL 122 11/21/2016 1510   TRIG 113 11/21/2016 1510   HDL 41 11/21/2016 1510   CHOLHDL 3.0 11/21/2016 1510   VLDL 23 11/21/2016 1510   LDLCALC 58 11/21/2016 1510    Other Studies Reviewed Today:  Echocardiogram 06/22/2016: Study Conclusions  - Left ventricle: The cavity size was normal. Wall thickness was   increased in a pattern of mild  LVH. Systolic function was normal.   The estimated ejection fraction was in the range of 55% to 60%.   Wall motion was normal; there were no regional wall motion   abnormalities. Features are consistent with a pseudonormal left   ventricular filling pattern, with concomitant abnormal relaxation   and increased filling pressure (grade 2 diastolic dysfunction). - Ventricular septum: The contour showed mild systolic flattening. - Left atrium: The atrium was moderately dilated. - Right ventricle: The cavity size was mildly dilated. - Right atrium: The atrium was moderately dilated. - Tricuspid valve: There was moderate regurgitation. - Pulmonary arteries: Systolic pressure was severely increased. PA   peak pressure: 84 mm Hg (S).  Assessment and Plan:    Current medicines were reviewed with the patient today.  No orders of the defined types were placed in this encounter.   Disposition:  Signed, Satira Sark, MD, [facility] 01/12/2017 8:32 AM    [facility] [place], [place], [place] [address] Phone: [phone]; Fax: [phone]

## 2017-01-16 ENCOUNTER — Encounter: Payer: Self-pay | Admitting: Cardiology

## 2017-01-26 ENCOUNTER — Encounter: Payer: Self-pay | Admitting: "Endocrinology

## 2017-01-26 ENCOUNTER — Ambulatory Visit: Payer: Medicare Other | Admitting: "Endocrinology

## 2017-02-21 ENCOUNTER — Encounter (HOSPITAL_COMMUNITY): Payer: Medicare Other | Admitting: Cardiology

## 2017-02-28 ENCOUNTER — Other Ambulatory Visit (HOSPITAL_COMMUNITY): Payer: Medicare Other

## 2017-02-28 ENCOUNTER — Encounter (HOSPITAL_COMMUNITY): Payer: Medicare Other

## 2017-02-28 ENCOUNTER — Ambulatory Visit: Payer: Medicare Other | Admitting: Family

## 2017-03-06 ENCOUNTER — Encounter (HOSPITAL_COMMUNITY): Payer: Medicare Other

## 2017-03-06 ENCOUNTER — Other Ambulatory Visit (HOSPITAL_COMMUNITY): Payer: Medicare Other

## 2017-03-06 ENCOUNTER — Ambulatory Visit: Payer: Medicare Other | Admitting: Family

## 2017-03-21 ENCOUNTER — Other Ambulatory Visit: Payer: Self-pay | Admitting: "Endocrinology

## 2017-03-21 ENCOUNTER — Other Ambulatory Visit: Payer: Self-pay | Admitting: Cardiology

## 2017-03-31 ENCOUNTER — Inpatient Hospital Stay (HOSPITAL_COMMUNITY)
Admission: EM | Admit: 2017-03-31 | Discharge: 2017-04-04 | DRG: 291 | Disposition: A | Discharge status: Home Health | Payer: Medicare Other | Attending: Family Medicine | Admitting: Family Medicine

## 2017-03-31 ENCOUNTER — Emergency Department (HOSPITAL_COMMUNITY): Payer: Medicare Other

## 2017-03-31 ENCOUNTER — Encounter (HOSPITAL_COMMUNITY): Payer: Self-pay | Admitting: Cardiology

## 2017-03-31 DIAGNOSIS — E1122 Type 2 diabetes mellitus with diabetic chronic kidney disease: Secondary | ICD-10-CM | POA: Diagnosis present

## 2017-03-31 DIAGNOSIS — Z9981 Dependence on supplemental oxygen: Secondary | ICD-10-CM

## 2017-03-31 DIAGNOSIS — Z7982 Long term (current) use of aspirin: Secondary | ICD-10-CM

## 2017-03-31 DIAGNOSIS — I5033 Acute on chronic diastolic (congestive) heart failure: Secondary | ICD-10-CM | POA: Diagnosis not present

## 2017-03-31 DIAGNOSIS — N184 Chronic kidney disease, stage 4 (severe): Secondary | ICD-10-CM | POA: Diagnosis present

## 2017-03-31 DIAGNOSIS — E782 Mixed hyperlipidemia: Secondary | ICD-10-CM | POA: Diagnosis present

## 2017-03-31 DIAGNOSIS — E039 Hypothyroidism, unspecified: Secondary | ICD-10-CM | POA: Diagnosis present

## 2017-03-31 DIAGNOSIS — I2510 Atherosclerotic heart disease of native coronary artery without angina pectoris: Secondary | ICD-10-CM | POA: Diagnosis present

## 2017-03-31 DIAGNOSIS — L8990 Pressure ulcer of unspecified site, unspecified stage: Secondary | ICD-10-CM

## 2017-03-31 DIAGNOSIS — E1165 Type 2 diabetes mellitus with hyperglycemia: Secondary | ICD-10-CM | POA: Diagnosis present

## 2017-03-31 DIAGNOSIS — Z87891 Personal history of nicotine dependence: Secondary | ICD-10-CM | POA: Diagnosis not present

## 2017-03-31 DIAGNOSIS — R635 Abnormal weight gain: Secondary | ICD-10-CM | POA: Diagnosis not present

## 2017-03-31 DIAGNOSIS — Z6831 Body mass index (BMI) 31.0-31.9, adult: Secondary | ICD-10-CM

## 2017-03-31 DIAGNOSIS — R0602 Shortness of breath: Secondary | ICD-10-CM | POA: Diagnosis not present

## 2017-03-31 DIAGNOSIS — E11649 Type 2 diabetes mellitus with hypoglycemia without coma: Secondary | ICD-10-CM | POA: Diagnosis not present

## 2017-03-31 DIAGNOSIS — Z79899 Other long term (current) drug therapy: Secondary | ICD-10-CM | POA: Diagnosis not present

## 2017-03-31 DIAGNOSIS — Z8249 Family history of ischemic heart disease and other diseases of the circulatory system: Secondary | ICD-10-CM | POA: Diagnosis not present

## 2017-03-31 DIAGNOSIS — I252 Old myocardial infarction: Secondary | ICD-10-CM

## 2017-03-31 DIAGNOSIS — I130 Hypertensive heart and chronic kidney disease with heart failure and stage 1 through stage 4 chronic kidney disease, or unspecified chronic kidney disease: Secondary | ICD-10-CM | POA: Diagnosis not present

## 2017-03-31 DIAGNOSIS — Z794 Long term (current) use of insulin: Secondary | ICD-10-CM | POA: Diagnosis not present

## 2017-03-31 DIAGNOSIS — IMO0002 Reserved for concepts with insufficient information to code with codable children: Secondary | ICD-10-CM | POA: Diagnosis present

## 2017-03-31 DIAGNOSIS — E669 Obesity, unspecified: Secondary | ICD-10-CM | POA: Diagnosis present

## 2017-03-31 DIAGNOSIS — J9621 Acute and chronic respiratory failure with hypoxia: Secondary | ICD-10-CM | POA: Diagnosis present

## 2017-03-31 DIAGNOSIS — Z8673 Personal history of transient ischemic attack (TIA), and cerebral infarction without residual deficits: Secondary | ICD-10-CM

## 2017-03-31 DIAGNOSIS — Z90710 Acquired absence of both cervix and uterus: Secondary | ICD-10-CM

## 2017-03-31 DIAGNOSIS — Z79890 Hormone replacement therapy (postmenopausal): Secondary | ICD-10-CM | POA: Diagnosis not present

## 2017-03-31 DIAGNOSIS — Z833 Family history of diabetes mellitus: Secondary | ICD-10-CM | POA: Diagnosis not present

## 2017-03-31 DIAGNOSIS — E6609 Other obesity due to excess calories: Secondary | ICD-10-CM

## 2017-03-31 DIAGNOSIS — R040 Epistaxis: Secondary | ICD-10-CM | POA: Diagnosis not present

## 2017-03-31 DIAGNOSIS — Z7902 Long term (current) use of antithrombotics/antiplatelets: Secondary | ICD-10-CM | POA: Diagnosis not present

## 2017-03-31 DIAGNOSIS — Z951 Presence of aortocoronary bypass graft: Secondary | ICD-10-CM | POA: Diagnosis not present

## 2017-03-31 DIAGNOSIS — J810 Acute pulmonary edema: Secondary | ICD-10-CM | POA: Diagnosis not present

## 2017-03-31 DIAGNOSIS — N183 Chronic kidney disease, stage 3 (moderate): Secondary | ICD-10-CM | POA: Diagnosis present

## 2017-03-31 DIAGNOSIS — E1151 Type 2 diabetes mellitus with diabetic peripheral angiopathy without gangrene: Secondary | ICD-10-CM | POA: Diagnosis present

## 2017-03-31 DIAGNOSIS — I739 Peripheral vascular disease, unspecified: Secondary | ICD-10-CM | POA: Diagnosis present

## 2017-03-31 LAB — COMPREHENSIVE METABOLIC PANEL
ALT: 16 U/L (ref 14–54)
AST: 21 U/L (ref 15–41)
Albumin: 3.9 g/dL (ref 3.5–5.0)
Alkaline Phosphatase: 79 U/L (ref 38–126)
Anion gap: 12 (ref 5–15)
BUN: 72 mg/dL — ABNORMAL HIGH (ref 6–20)
CHLORIDE: 98 mmol/L — AB (ref 101–111)
CO2: 25 mmol/L (ref 22–32)
CREATININE: 1.57 mg/dL — AB (ref 0.44–1.00)
Calcium: 9.2 mg/dL (ref 8.9–10.3)
GFR calc Af Amer: 38 mL/min — ABNORMAL LOW (ref >60)
GFR, EST NON AFRICAN AMERICAN: 33 mL/min — AB (ref >60)
Glucose, Bld: 203 mg/dL — ABNORMAL HIGH (ref 65–99)
Potassium: 4.7 mmol/L (ref 3.5–5.1)
Sodium: 135 mmol/L (ref 135–145)
TOTAL PROTEIN: 8 g/dL (ref 6.5–8.1)
Total Bilirubin: 0.4 mg/dL (ref 0.3–1.2)

## 2017-03-31 LAB — CBC WITH DIFFERENTIAL/PLATELET
Basophils Absolute: 0 10*3/uL (ref 0.0–0.1)
Basophils Relative: 0 %
EOS PCT: 2 %
Eosinophils Absolute: 0.1 10*3/uL (ref 0.0–0.7)
HCT: 32.6 % — ABNORMAL LOW (ref 36.0–46.0)
Hemoglobin: 10.1 g/dL — ABNORMAL LOW (ref 12.0–15.0)
LYMPHS PCT: 9 %
Lymphs Abs: 0.8 10*3/uL (ref 0.7–4.0)
MCH: 29.3 pg (ref 26.0–34.0)
MCHC: 31 g/dL (ref 30.0–36.0)
MCV: 94.5 fL (ref 78.0–100.0)
MONO ABS: 0.5 10*3/uL (ref 0.1–1.0)
MONOS PCT: 5 %
Neutro Abs: 7.1 10*3/uL (ref 1.7–7.7)
Neutrophils Relative %: 84 %
PLATELETS: 182 10*3/uL (ref 150–400)
RBC: 3.45 MIL/uL — ABNORMAL LOW (ref 3.87–5.11)
RDW: 15.2 % (ref 11.5–15.5)
WBC: 8.5 10*3/uL (ref 4.0–10.5)

## 2017-03-31 LAB — GLUCOSE, CAPILLARY: GLUCOSE-CAPILLARY: 169 mg/dL — AB (ref 65–99)

## 2017-03-31 LAB — HEMOGLOBIN A1C
Hgb A1c MFr Bld: 6.2 % — ABNORMAL HIGH (ref 4.8–5.6)
Mean Plasma Glucose: 131.24 mg/dL

## 2017-03-31 LAB — BRAIN NATRIURETIC PEPTIDE: B NATRIURETIC PEPTIDE 5: 475 pg/mL — AB (ref 0.0–100.0)

## 2017-03-31 LAB — TSH: TSH: 3.573 u[IU]/mL (ref 0.350–4.500)

## 2017-03-31 LAB — TROPONIN I: Troponin I: <0.03 ng/mL (ref <0.03)

## 2017-03-31 MED ORDER — SODIUM CHLORIDE 0.9% FLUSH: 3.0000 mL | INTRAVENOUS | Status: DC | PRN

## 2017-03-31 MED ORDER — ATORVASTATIN CALCIUM 40 MG PO TABS
40.0000 mg | ORAL_TABLET | Freq: Every day | ORAL | Status: DC
  Administered 2017-03-31 – 2017-04-03 (×4): 40 mg via ORAL
  Filled 2017-03-31 (×5): qty 1

## 2017-03-31 MED ORDER — ADULT MULTIVITAMIN W/MINERALS CH
1.0000 | ORAL_TABLET | Freq: Every day | ORAL | Status: DC
  Administered 2017-04-01 – 2017-04-04 (×4): 1 via ORAL
  Filled 2017-03-31 (×4): qty 1

## 2017-03-31 MED ORDER — LINAGLIPTIN 5 MG PO TABS
5.0000 mg | ORAL_TABLET | Freq: Every day | ORAL | Status: DC
  Administered 2017-04-01 – 2017-04-04 (×4): 5 mg via ORAL
  Filled 2017-03-31 (×4): qty 1

## 2017-03-31 MED ORDER — AMLODIPINE BESYLATE 5 MG PO TABS
10.0000 mg | ORAL_TABLET | Freq: Every day | ORAL | Status: DC
  Administered 2017-04-02 – 2017-04-04 (×3): 10 mg via ORAL
  Filled 2017-03-31 (×3): qty 2

## 2017-03-31 MED ORDER — INSULIN DETEMIR 100 UNIT/ML ~~LOC~~ SOLN
10.0000 [IU] | Freq: Every day | SUBCUTANEOUS | Status: DC
  Administered 2017-04-01 – 2017-04-04 (×4): 10 [IU] via SUBCUTANEOUS
  Filled 2017-03-31 (×5): qty 0.1

## 2017-03-31 MED ORDER — SODIUM CHLORIDE 0.9 % IV SOLN: 250.0000 mL | INTRAVENOUS | Status: DC | PRN

## 2017-03-31 MED ORDER — ISOSORBIDE MONONITRATE ER 60 MG PO TB24
30.0000 mg | ORAL_TABLET | Freq: Every day | ORAL | Status: DC
  Administered 2017-04-02 – 2017-04-04 (×3): 30 mg via ORAL
  Filled 2017-03-31 (×3): qty 1

## 2017-03-31 MED ORDER — INFLUENZA VAC SPLIT HIGH-DOSE 0.5 ML IM SUSY: 0.5000 mL | PREFILLED_SYRINGE | INTRAMUSCULAR | Status: DC

## 2017-03-31 MED ORDER — POTASSIUM CHLORIDE CRYS ER 20 MEQ PO TBCR
20.0000 meq | EXTENDED_RELEASE_TABLET | Freq: Two times a day (BID) | ORAL | Status: DC
  Administered 2017-03-31 – 2017-04-04 (×8): 20 meq via ORAL
  Filled 2017-03-31 (×9): qty 1

## 2017-03-31 MED ORDER — CARVEDILOL 12.5 MG PO TABS
12.5000 mg | ORAL_TABLET | Freq: Two times a day (BID) | ORAL | Status: DC
  Administered 2017-03-31 – 2017-04-02 (×3): 12.5 mg via ORAL
  Filled 2017-03-31 (×7): qty 1

## 2017-03-31 MED ORDER — SPIRONOLACTONE 25 MG PO TABS
12.5000 mg | ORAL_TABLET | Freq: Every day | ORAL | Status: DC
  Administered 2017-04-02: 12.5 mg via ORAL
  Filled 2017-03-31 (×2): qty 0.5
  Filled 2017-03-31 (×3): qty 1
  Filled 2017-03-31 (×2): qty 0.5

## 2017-03-31 MED ORDER — LEVOTHYROXINE SODIUM 75 MCG PO TABS
75.0000 ug | ORAL_TABLET | Freq: Every day | ORAL | Status: DC
  Administered 2017-04-01 – 2017-04-04 (×4): 75 ug via ORAL
  Filled 2017-03-31 (×4): qty 1

## 2017-03-31 MED ORDER — ONDANSETRON HCL 4 MG/2ML IJ SOLN
4.0000 mg | Freq: Four times a day (QID) | INTRAMUSCULAR | Status: DC | PRN
  Administered 2017-04-01: 4 mg via INTRAVENOUS
  Filled 2017-03-31: qty 2

## 2017-03-31 MED ORDER — INSULIN ASPART 100 UNIT/ML ~~LOC~~ SOLN
0.0000 [IU] | Freq: Three times a day (TID) | SUBCUTANEOUS | Status: DC
  Administered 2017-04-01: 2 [IU] via SUBCUTANEOUS
  Administered 2017-04-02 – 2017-04-03 (×2): 3 [IU] via SUBCUTANEOUS
  Administered 2017-04-03: 2 [IU] via SUBCUTANEOUS
  Administered 2017-04-03: 5 [IU] via SUBCUTANEOUS
  Administered 2017-04-04: 2 [IU] via SUBCUTANEOUS

## 2017-03-31 MED ORDER — SODIUM CHLORIDE 0.9% FLUSH
3.0000 mL | Freq: Two times a day (BID) | INTRAVENOUS | Status: DC
  Administered 2017-03-31 – 2017-04-04 (×7): 3 mL via INTRAVENOUS

## 2017-03-31 MED ORDER — ALBUTEROL SULFATE (2.5 MG/3ML) 0.083% IN NEBU
3.0000 mL | INHALATION_SOLUTION | Freq: Every day | RESPIRATORY_TRACT | Status: DC | PRN
Start: 2017-03-31 — End: 2017-04-04

## 2017-03-31 MED ORDER — UMECLIDINIUM-VILANTEROL 62.5-25 MCG/INH IN AEPB
1.0000 | INHALATION_SPRAY | Freq: Every day | RESPIRATORY_TRACT | Status: DC
  Filled 2017-03-31: qty 14

## 2017-03-31 MED ORDER — CITALOPRAM HYDROBROMIDE 20 MG PO TABS
20.0000 mg | ORAL_TABLET | ORAL | Status: DC
  Administered 2017-04-01 – 2017-04-04 (×4): 20 mg via ORAL
  Filled 2017-03-31 (×4): qty 1

## 2017-03-31 MED ORDER — GABAPENTIN 300 MG PO CAPS
600.0000 mg | ORAL_CAPSULE | Freq: Every day | ORAL | Status: DC
  Administered 2017-03-31 – 2017-04-03 (×4): 600 mg via ORAL
  Filled 2017-03-31 (×4): qty 2

## 2017-03-31 MED ORDER — FUROSEMIDE 10 MG/ML IJ SOLN
80.0000 mg | INTRAMUSCULAR | Status: AC
  Administered 2017-03-31: 80 mg via INTRAVENOUS
  Filled 2017-03-31: qty 8

## 2017-03-31 MED ORDER — ADULT MULTIVITAMIN W/MINERALS CH: 1.0000 | ORAL_TABLET | Freq: Every day | ORAL | Status: DC

## 2017-03-31 MED ORDER — INSULIN ASPART 100 UNIT/ML ~~LOC~~ SOLN: 0.0000 [IU] | Freq: Every day | SUBCUTANEOUS | Status: DC

## 2017-03-31 MED ORDER — LOSARTAN POTASSIUM 25 MG PO TABS
12.5000 mg | ORAL_TABLET | Freq: Every day | ORAL | Status: DC
  Administered 2017-04-02: 12.5 mg via ORAL
  Filled 2017-03-31: qty 1

## 2017-03-31 MED ORDER — ASPIRIN EC 81 MG PO TBEC
81.0000 mg | DELAYED_RELEASE_TABLET | Freq: Every day | ORAL | Status: DC
  Administered 2017-04-01 – 2017-04-04 (×4): 81 mg via ORAL
  Filled 2017-03-31 (×7): qty 1

## 2017-03-31 MED ORDER — CLOPIDOGREL BISULFATE 75 MG PO TABS
75.0000 mg | ORAL_TABLET | Freq: Every day | ORAL | Status: DC
  Administered 2017-04-01 – 2017-04-04 (×4): 75 mg via ORAL
  Filled 2017-03-31 (×4): qty 1

## 2017-03-31 MED ORDER — INSULIN ASPART 100 UNIT/ML ~~LOC~~ SOLN
4.0000 [IU] | Freq: Three times a day (TID) | SUBCUTANEOUS | Status: DC
  Administered 2017-04-01 – 2017-04-04 (×8): 4 [IU] via SUBCUTANEOUS

## 2017-03-31 MED ORDER — FUROSEMIDE 10 MG/ML IJ SOLN
80.0000 mg | Freq: Two times a day (BID) | INTRAMUSCULAR | Status: DC
  Administered 2017-03-31 – 2017-04-02 (×3): 80 mg via INTRAVENOUS
  Filled 2017-03-31 (×3): qty 8

## 2017-03-31 MED ORDER — ACETAMINOPHEN 325 MG PO TABS: 650.0000 mg | ORAL_TABLET | ORAL | Status: DC | PRN

## 2017-03-31 MED ORDER — ENOXAPARIN SODIUM 40 MG/0.4ML ~~LOC~~ SOLN
40.0000 mg | SUBCUTANEOUS | Status: DC
  Administered 2017-03-31 – 2017-04-02 (×3): 40 mg via SUBCUTANEOUS
  Filled 2017-03-31 (×4): qty 0.4

## 2017-03-31 MED ORDER — GABAPENTIN 300 MG PO CAPS
300.0000 mg | ORAL_CAPSULE | Freq: Every day | ORAL | Status: DC
  Administered 2017-04-01 – 2017-04-04 (×4): 300 mg via ORAL
  Filled 2017-03-31 (×4): qty 1

## 2017-03-31 MED ORDER — HYDRALAZINE HCL 25 MG PO TABS
75.0000 mg | ORAL_TABLET | Freq: Three times a day (TID) | ORAL | Status: DC
  Administered 2017-03-31 – 2017-04-03 (×8): 75 mg via ORAL
  Filled 2017-03-31 (×11): qty 3

## 2017-03-31 MED ORDER — TIZANIDINE HCL 4 MG PO TABS
4.0000 mg | ORAL_TABLET | Freq: Two times a day (BID) | ORAL | Status: DC | PRN
  Administered 2017-04-03 – 2017-04-04 (×2): 4 mg via ORAL
  Filled 2017-03-31 (×2): qty 1

## 2017-03-31 NOTE — H&P (Signed)
History and Physical    [name] [name] RXY:[id number] DOB: [birth year] DOA: 03/31/2017  Referring MD/NP/PA: Noemi Chapel, EDP PCP: Glenda Chroman, MD  Patient coming from: Home  Chief Complaint: Shortness of breath, abdominal swelling  HPI: [name] [name] is a 72 y.o. female with history of chronic diastolic heart failure, peripheral artery disease with carotid artery disease and bilateral carotid endarterectomies, depression, hypertension, hypothyroidism among other issues who presents to the hospital today with a 3-4-day history of progressive shortness of breath and abdominal edema.  Patient states she has had to increase her home oxygen from 2-5.  Her daughter became concerned when she saw her today and noticed her significant increased abdominal girth and urged her to come to the hospital for evaluation.  In the ED her vital signs are stable, labs are significant for a creatinine of 1.57 which is her baseline, BNP of 475, hemoglobin of 10 which is her baseline.  Chest x-ray with pulmonary edema.  Admission is requested for treatment of acute exacerbation of heart failure.  Past Medical/Surgical History: Past Medical History:  Diagnosis Date  . Arthritis   . Carotid artery disease (HCC)    a. Bilateral CEA; 50-60% bilateral ICA stenosis, 11/12  . Cellulitis    a. Recurrent, bilateral legs  . CHF (congestive heart failure) ([place])   . Chronic back pain   . Chronic diastolic heart failure ([place])   . Coronary atherosclerosis of native coronary artery    a. 11/2010 NSTEMI/CABG x 4: L-LAD; S-PL; S-OM; S-DX, by PVT.  [name] Critical lower limb ischemia   . Depression   . Essential hypertension, benign   . Herniated disc   . History of blood transfusion   . Hypothyroidism   . Ischemic cardiomyopathy    a. 11/2010 TEE: EF 40-45%.  . Migraine   . Mixed hyperlipidemia   . NSTEMI (non-ST elevated myocardial infarction) ([place]) 11/2010  . On home oxygen therapy   . PAD (peripheral artery disease)  ([place])   . Pleural effusion   . Pneumonia 2000's X 2  . Stroke ([place]) 08/2009  . Suicide attempt ([place]) 08/2002  . TIA (transient ischemic attack)   . TMJ syndrome   . Type 2 diabetes mellitus ([place])   . Venous insufficiency     Past Surgical History:  Procedure Laterality Date  . ABDOMINAL HYSTERECTOMY    . APPENDECTOMY    . CARDIAC CATHETERIZATION  11/2010  . CARDIAC CATHETERIZATION N/A 09/16/2015   Procedure: Right Heart Cath;  Surgeon: Larey Dresser, MD;  Location: [facility];  Service: Cardiovascular;  Laterality: N/A;  . CAROTID ENDARTERECTOMY Bilateral 2011   Bilateral - Dr. Kellie Simmering  . CHOLECYSTECTOMY    . COLONOSCOPY W/ BIOPSIES AND POLYPECTOMY    . CORONARY ANGIOPLASTY WITH STENT PLACEMENT  03/2013   "1"  . CORONARY ARTERY BYPASS GRAFT  11/24/2010   Procedure: CORONARY ARTERY BYPASS GRAFTING (CABG);  Surgeon: Tharon Aquas Adelene Idler, MD;  Location: [place];  Service: Open Heart Surgery;  Laterality: N/A;  Coronary Artery Bypass Graft times four on pump utilizing left internal mammary artery and bilateral greater saphenous veins harvested endoscopically, transesophageal echocardiogram   . DEBRIDEMENT TOE Left    "nonhealing wound; 3rd digit  . DILATION AND CURETTAGE OF UTERUS    . ENDARTERECTOMY POPLITEAL Left 10/07/2013   Procedure: LEFT POPLITEAL ENDARTERECTOMY ;  Surgeon: Angelia Mould, MD;  Location: [place];  Service: Vascular;  Laterality: Left;  . FEMORAL-POPLITEAL BYPASS GRAFT Left  10/07/2013   Procedure: LEFT FEMORAL-POPLITEAL ARTERY BYPASS GRAFT;  Surgeon: Angelia Mould, MD;  Location: [place] [facility];  Service: Vascular;  Laterality: Left;  . FRACTURE SURGERY    . INTRAOPERATIVE ARTERIOGRAM Left 10/07/2013   Procedure: INTRA OPERATIVE ARTERIOGRAM LEFT LEG;  Surgeon: Angelia Mould, MD;  Location: [place];  Service: Vascular;  Laterality: Left;  . IR GENERIC HISTORICAL  09/21/2015   IR REMOVAL OF PLURAL CATH W/CUFF 09/21/2015 Marybelle Killings, MD [facility]-INTERV RAD  . LEFT  AND RIGHT HEART CATHETERIZATION WITH CORONARY/GRAFT ANGIOGRAM N/A 03/27/2013   Procedure: LEFT AND RIGHT HEART CATHETERIZATION WITH Beatrix Fetters;  Surgeon: Burnell Blanks, MD;  Location: [facility] CATH LAB;  Service: Cardiovascular;  Laterality: N/A;  . LOWER EXTREMITY ANGIOGRAM  09/08/2012; 10/06/2013   "found 100% blockage; unsuccessful attempt at crossing a chronic total occlusion of the left SFA in the setting of critical limb ischemia  . LOWER EXTREMITY ANGIOGRAM Bilateral 09/08/2013   Procedure: LOWER EXTREMITY ANGIOGRAM;  Surgeon: [name], MD;  Location: [facility] CATH LAB;  Service: Cardiovascular;  Laterality: Bilateral;  . PERIPHERAL VASCULAR CATHETERIZATION  11/04/2015   Procedure: Peripheral Vascular Atherectomy;  Surgeon: [name], MD;  Location: [place] [facility];  Service: Cardiovascular;;  RT. SFA  . PERIPHERAL VASCULAR CATHETERIZATION Bilateral 11/04/2015   Procedure: Lower Extremity Intervention;  Surgeon: [name], MD;  Location: [place] [facility];  Service: Cardiovascular;  Laterality: Bilateral;  . PERIPHERAL VASCULAR CATHETERIZATION  11/04/2015   Procedure: Peripheral Vascular Balloon Angioplasty;  Surgeon: [name], MD;  Location: [place] [facility];  Service: Cardiovascular;;  TP Trunk  . TOE SURGERY Left    "put pin in 2nd toe"  . TUBAL LIGATION    . WRIST FRACTURE SURGERY Left    "grafted bone from hip to wrist"    Social History:  reports that she quit smoking about 7 years ago. Her smoking use included cigarettes. She started smoking about 61 years ago. She has a 150.00 pack-year smoking history. She has never used smokeless tobacco. She reports that she drinks alcohol. She reports that she has current or past drug history. Drug: Marijuana.  Allergies: Allergies  Allergen Reactions  . Other Shortness Of Breath, Rash and Other (See Comments)    CANNOT EAT ANY BERRIES!!  . Strawberry Extract Hives, Shortness Of Breath and Rash    . Sulfa Antibiotics Swelling and Other (See Comments)    Bodily Swelling  . Coffee Bean Extract [Coffea Arabica] Itching and Rash  . Tape Other (See Comments)    Tears skin.  Please use "paper" tape only.    Family History:  Family History  Problem Relation Age of Onset  . Coronary artery disease Father        Died with MI age 33  . Heart disease Father        before age 72  . Heart attack Father   . Diabetes type II Mother   . Hypertension Mother   . Diabetes Mother   . Heart disease Mother   . Hyperlipidemia Mother   . Heart failure Sister   . Cancer Sister   . Cancer Brother        Lung cancer  . Diabetes Daughter   . Hyperlipidemia Daughter   . Diabetes Son   . Hyperlipidemia Son     Prior to Admission medications   Medication Sig Start Date End Date Taking? Authorizing Provider  acetaminophen (TYLENOL) 500 MG tablet Take 500-1,000 mg  by mouth every 6 (six) hours as needed for mild pain or moderate pain.    Yes [provider]  amLODipine (NORVASC) 10 MG tablet Take 1 tablet (10 mg total) by mouth daily. 09/25/16  Yes Lendon Colonel, NP  aspirin 81 MG EC tablet Take 81 mg by mouth daily. 03/29/13  Yes Lyda Jester M, PA-C  atorvastatin (LIPITOR) 40 MG tablet Take 1 tablet (40 mg total) by mouth daily at 6 PM. 09/25/16  Yes Lendon Colonel, NP  citalopram (CELEXA) 40 MG tablet Take 20 mg by mouth every morning.  05/20/15  Yes [provider]  clopidogrel (PLAVIX) 75 MG tablet TAKE ONE TABLET BY MOUTH ONCE DAILY WITH  BREAKFAST 09/25/16  Yes Lendon Colonel, NP  clotrimazole-betamethasone (LOTRISONE) cream Apply 1 application topically 2 (two) times daily as needed. 01/24/16  Yes [provider]  Docusate Calcium (STOOL SOFTENER PO) Take 200 mg by mouth at bedtime as needed (for constipation).    Yes [provider]  gabapentin (NEURONTIN) 300 MG capsule Take 300-600 mg by mouth See admin instructions. 300 mg in the morning  and 600 mg prior to bed(time) 08/02/14  Yes [provider]  hydrALAZINE (APRESOLINE) 25 MG tablet Take 3 tablets (75 mg total) by mouth every 8 (eight) hours. 09/25/16  Yes Lendon Colonel, NP  insulin detemir (LEVEMIR) 100 UNIT/ML injection Inject 10 Units into the skin daily with breakfast.   Yes [provider]  isosorbide mononitrate (IMDUR) 30 MG 24 hr tablet Take 1 tablet (30 mg total) by mouth daily. 09/25/16  Yes Lendon Colonel, NP  JANUVIA 25 MG tablet TAKE 1 TABLET BY MOUTH ONCE DAILY 03/22/17  Yes Nida, Marella Chimes, MD  levothyroxine (SYNTHROID, LEVOTHROID) 88 MCG tablet Take 1 tablet (88 mcg total) by mouth daily before breakfast. Patient taking differently: Take 75 mcg by mouth daily before breakfast.  10/27/16  Yes Nida, Marella Chimes, MD  losartan (COZAAR) 25 MG tablet Take 0.5 tablets (12.5 mg total) by mouth daily. 09/25/16 03/31/17 Yes Lendon Colonel, NP  Multiple Vitamin (MULTIVITAMIN WITH MINERALS) TABS tablet Take 1 tablet by mouth daily.   Yes [provider]  potassium chloride SA (K-DUR,KLOR-CON) 20 MEQ tablet Take 1 tablet (20 mEq total) by mouth 2 (two) times daily. 09/25/16  Yes Lendon Colonel, NP  spironolactone (ALDACTONE) 25 MG tablet Take 0.5 tablets (12.5 mg total) by mouth daily. 09/25/16  Yes Lendon Colonel, NP  torsemide (DEMADEX) 20 MG tablet TAKE 3 TABLETS BY MOUTH IN THE MORNING AND 2 IN THE EVENING MAY  TAKE  AN  ADDITIONAL  TABLET  IF  WEIGHT  INCREASES 03/21/17  Yes Satira Sark, MD  traMADol (ULTRAM) 50 MG tablet Take 50 mg by mouth. 08/28/16  Yes [provider]  albuterol (PROVENTIL HFA;VENTOLIN HFA) 108 (90 BASE) MCG/ACT inhaler Inhale 2 puffs into the lungs daily as needed for wheezing or shortness of breath.     [provider]  carvedilol (COREG) 12.5 MG tablet Take by mouth.    [provider]  nitroGLYCERIN (NITROSTAT) 0.4 MG SL tablet Place 1 tablet (0.4 mg total) under  the tongue every 5 (five) minutes as needed for chest pain. Up to 3 doses. If no relief after 3rd dose, proceed to the ED for an evaluation 09/30/14   Satira Sark, MD  nystatin (MYCOSTATIN/NYSTOP) powder Apply 1 application topically 2 (two) times daily as needed. 01/24/16   [provider]  tiZANidine (ZANAFLEX) 4 MG tablet Take 4 mg by mouth 2 (two) times daily as needed for muscle spasms.    [provider]  umeclidinium-vilanterol (ANORO ELLIPTA) 62.5-25 MCG/INH AEPB Inhale 1 puff into the lungs daily.    [provider]    Review of Systems:  Constitutional: Denies fever, chills, diaphoresis, appetite change and fatigue.  HEENT: Denies photophobia, eye pain, redness, hearing loss, ear pain, congestion, sore throat, rhinorrhea, sneezing, mouth sores, trouble swallowing, neck pain, neck stiffness and tinnitus.   Respiratory: Denies  cough, chest tightness,  and wheezing.   Cardiovascular: Denies chest pain, palpitations and leg swelling.  Gastrointestinal: Denies nausea, vomiting, abdominal pain, diarrhea, constipation, blood in stool and abdominal distention.  Genitourinary: Denies dysuria, urgency, frequency, hematuria, flank pain and difficulty urinating.  Endocrine: Denies: hot or cold intolerance, sweats, changes in hair or nails, polyuria, polydipsia. Musculoskeletal: Denies myalgias, back pain, joint swelling, arthralgias and gait problem.  Skin: Denies pallor, rash and wound.  Neurological: Denies dizziness, seizures, syncope, weakness, light-headedness, numbness and headaches.  Hematological: Denies adenopathy. Easy bruising, personal or family bleeding history  Psychiatric/Behavioral: Denies suicidal ideation, mood changes, confusion, nervousness, sleep disturbance and agitation    Physical Exam: Vitals:   03/31/17 1400 03/31/17 1430 03/31/17 1500 03/31/17 1618  BP: (!) 126/52 (!) 132/42 (!) 128/43 (!) 144/31  Pulse: (!) 54 (!) 54 (!) 54 62    Resp: _0 Temp:    98.3 F (36.8 C)  TempSrc:      SpO2: 98% 100% 100% 96%  Weight:    89.1 kg (196 lb 6.9 oz)  Height:    _1  (1.676 m)     Constitutional: NAD, calm, comfortable Eyes: PERRL, lids and conjunctivae normal, she is wearing an eye patch over her left eye, when questioned why she states that she has been having increased sensitivity in light and tearing of that eye, grossly it appears normal, she states she has an appointment with her eye doctor scheduled for the end of next week. ENMT: Mucous membranes are moist. Posterior pharynx clear of any exudate or lesions.Normal dentition.  Neck: normal, supple, no masses, no thyromegaly Respiratory: Bilateral crackles at the bases normal respiratory effort. No accessory muscle use.  Cardiovascular: Regular rate and rhythm, no murmurs / rubs / gallops. No extremity edema. 2+ pedal pulses. No carotid bruits.  Abdomen: no tenderness, no masses palpated. No hepatosplenomegaly. Bowel sounds positive.  Musculoskeletal: no clubbing / cyanosis. No joint deformity upper and lower extremities. Good ROM, no contractures. Normal muscle tone.  Skin: no rashes, lesions, ulcers. No induration Neurologic: CN 2-12 grossly intact. Sensation intact, DTR normal. Strength 5/5 in all 4.  Psychiatric: Normal judgment and insight. Alert and oriented x 3. Normal mood.    Labs on Admission: I have personally reviewed the following labs and imaging studies  CBC: Recent Labs  Lab 03/31/17 1333  WBC 8.5  NEUTROABS 7.1  HGB 10.1*  HCT 32.6*  MCV 94.5  PLT 347   Basic Metabolic Panel: Recent Labs  Lab 03/31/17 1333  NA 135  K 4.7  CL 98*  CO2 25  GLUCOSE 203*  BUN 72*  CREATININE 1.57*  CALCIUM 9.2   GFR: Estimated Creatinine Clearance: 38 mL/min (A) (by C-G formula based on SCr of 1.57 mg/dL (H)). Liver Function Tests: Recent Labs  Lab 03/31/17 1333  AST 21  ALT 16  ALKPHOS 79  BILITOT 0.4  PROT 8.0  ALBUMIN 3.9   No  results for input(s): LIPASE, AMYLASE in the last 168 hours. No results for input(s): AMMONIA in the last 168 hours. Coagulation Profile: No results for input(s): INR, PROTIME in the last 168 hours. Cardiac Enzymes: Recent Labs  Lab 03/31/17 1333  TROPONINI <0.03   BNP (last 3 results) No results for input(s): PROBNP in the last 8760 hours. HbA1C: No results for input(s): HGBA1C in the last 72 hours. CBG: No results for input(s): GLUCAP in the last 168 hours. Lipid Profile: No results for input(s): CHOL, HDL, LDLCALC, TRIG, CHOLHDL, LDLDIRECT in the last 72 hours. Thyroid Function Tests: No results for input(s): TSH, T4TOTAL, FREET4, T3FREE, THYROIDAB in the last 72 hours. Anemia Panel: No results for input(s): VITAMINB12, FOLATE, FERRITIN, TIBC, IRON, RETICCTPCT in the last 72 hours. Urine analysis:    Component Value Date/Time   COLORURINE YELLOW 10/10/2014 [place] 10/10/2014 2345   LABSPEC 1.010 10/10/2014 2345   PHURINE 6.0 10/10/2014 2345   GLUCOSEU NEGATIVE 10/10/2014 2345   HGBUR TRACE (A) 10/10/2014 2345   BILIRUBINUR NEGATIVE 10/10/2014 2345   KETONESUR NEGATIVE 10/10/2014 2345   PROTEINUR 30 (A) 10/10/2014 2345   UROBILINOGEN 0.2 10/10/2014 2345   NITRITE NEGATIVE 10/10/2014 2345   LEUKOCYTESUR NEGATIVE 10/10/2014 2345   Sepsis Labs: _0 (procalcitonin:4,lacticidven:4) )No results found for this or any previous visit (from the past 240 hour(s)).   Radiological Exams on Admission: Dg Chest 2 View  Result Date: 03/31/2017 CLINICAL DATA:  Shortness of breath and weight gain. EXAM: CHEST - 2 VIEW COMPARISON:  07/27/2016. FINDINGS: Patient is rotated. Trachea is midline. Heart is enlarged. Mixed interstitial and airspace opacification bilaterally. No definite pleural fluid. IMPRESSION: Pulmonary edema. Electronically Signed   By: Lorin Picket M.D.   On: 03/31/2017 14:21    EKG: Independently reviewed.  Sinus bradycardia at a rate of 57,  no acute ischemic changes  Assessment/Plan Principal Problem:   Acute on chronic diastolic CHF (congestive heart failure) (HCC) Active Problems:   Diabetes mellitus type II, uncontrolled (HCC)   PVD (peripheral vascular disease) (HCC)   CKD (chronic kidney disease) stage 3, GFR 30-59 ml/min (HCC)   Acquired hypothyroidism   Class 1 obesity due to excess calories with serious comorbidity and body mass index (BMI) of 31.0 to 31.9 in adult    Acute on chronic hypoxemic respiratory failure -Due to acute on chronic diastolic heart failure. -She is chronically on 2 L of oxygen and is currently requiring 5 L. -Plan to admit to telemetry, start Lasix at a dose of 80 mg IV twice daily (she is on 60 mg of torsemide twice daily) -Strive for negative fluid balance. -Strict intake and output, daily weights. -Continue beta-blocker and ACE inhibitor. -Continue aspirin, statin, spironolactone.  Type 2 diabetes -Check A1c, continue Lantus and Januvia. -Place on moderate sliding scale insulin while in the hospital.  Chronic kidney disease stage III-IV -Baseline creatinine is around 1.5, she is currently at her baseline.  Hypothyroidism -Check TSH. -Continue home dose of Synthroid.  Hypertension -Currently well controlled, monitor on home medications.  Peripheral artery disease -Status post bilateral carotid endarterectomies. -Continue aspirin and Plavix.   DVT prophylaxis: Lovenox Code Status: Full code Family Communication: Patient only Disposition Plan: Home when medically stable, anticipate 48-72 hours Consults called: None Admission status: Inpatient   Time Spent: 85 minutes  Estela Isaac Bliss MD [facility] Pager [phone]  If 7PM-7AM, please contact night-coverage [email or web address] Password [facility]  03/31/2017, 5:48 PM

## 2017-03-31 NOTE — ED Triage Notes (Signed)
C/o sob and 5-6 lb weight gain over the past few days.

## 2017-03-31 NOTE — ED Notes (Signed)
MD at bedside assessing patient.

## 2017-03-31 NOTE — ED Provider Notes (Signed)
[facility] EMERGENCY DEPARTMENT Provider Note   CSN: [id number] Arrival date & time: 03/31/17  1257     History   Chief Complaint Chief Complaint  Patient presents with  . Shortness of Breath    HPI [name] [name] is a 72 y.o. female.  HPI  The patient is a 72 year old female, she has a known history of coronary disease, congestive heart failure, ischemic cardiomyopathy and presents with a chief complaint of weight gain and shortness of breath  The patient currently lives locally, she is followed by Dr. Domenic Polite as her cardiologist and has had problems with intermittent fluid retention over time, presents today stating that she has had increasing weight gain at approximately 5-6 pounds over the last several days.  During the same.  She does not note any increased work of breathing or shortness of breath, she wears 2 L of nasal cannula oxygen chronically 24/7.  The daughter who is here with her today who also helps with history notes that she is also been complaining of urinary symptoms though the patient has difficulty telling me what those problems are.  She denies swelling of her legs but does endorse significant swelling of her abdomen.  This is persistent, gradually worsening, not associated with fevers vomiting or diarrhea  Past Medical History:  Diagnosis Date  . Arthritis   . Carotid artery disease (HCC)    a. Bilateral CEA; 50-60% bilateral ICA stenosis, 11/12  . Cellulitis    a. Recurrent, bilateral legs  . CHF (congestive heart failure) ([place])   . Chronic back pain   . Chronic diastolic heart failure ([place])   . Coronary atherosclerosis of native coronary artery    a. 11/2010 NSTEMI/CABG x 4: L-LAD; S-PL; S-OM; S-DX, by PVT.  [name] Critical lower limb ischemia   . Depression   . Essential hypertension, benign   . Herniated disc   . History of blood transfusion   . Hypothyroidism   . Ischemic cardiomyopathy    a. 11/2010 TEE: EF 40-45%.  . Migraine   . Mixed  hyperlipidemia   . NSTEMI (non-ST elevated myocardial infarction) ([place]) 11/2010  . On home oxygen therapy   . PAD (peripheral artery disease) ([place])   . Pleural effusion   . Pneumonia 2000's X 2  . Stroke ([place]) 08/2009  . Suicide attempt ([place]) 08/2002  . TIA (transient ischemic attack)   . TMJ syndrome   . Type 2 diabetes mellitus ([place])   . Venous insufficiency     Patient Active Problem List   Diagnosis Date Noted  . Uncontrolled type 2 diabetes mellitus with hyperglycemia ([place]) 10/27/2016  . Class 1 obesity due to excess calories with serious comorbidity and body mass index (BMI) of 31.0 to 31.9 in adult 10/27/2016  . Acute on chronic diastolic (congestive) heart failure ([place]) 07/28/2016  . Acquired hypothyroidism 03/17/2016  . Subacute thyroiditis 11/26/2015  . Claudication ([place]) 11/04/2015  . Acute diastolic (congestive) heart failure ([place]) 09/14/2015  . CKD (chronic kidney disease) stage 3, GFR 30-59 ml/min (HCC) 07/27/2015  . Pleural effusion   . Recurrent pleural effusion on right   . Bradycardia, sinus 10/13/2014  . Palliative care encounter 10/13/2014  . Hypoglycemia 10/12/2014  . Anemia 10/12/2014  . Toe ulcer ([place]) 10/12/2014  . Unresponsive episode 10/12/2014  . Pulmonary hypertension ([place])   . Chest pain   . Pleural effusion, right 09/04/2014  . Pressure ulcer 09/04/2014  . CHF (congestive heart failure) ([place]) 09/04/2014  . Hypertensive urgency 09/03/2014  .  PVD (peripheral vascular disease) ([place]) 11/10/2013  . Critical lower limb ischemia 10/06/2013  . Chronic combined systolic and diastolic heart failure ([place]) 03/18/2013  . Diabetes mellitus type II, uncontrolled ([place]) 01/13/2011  . Hx of CABG 2012   . PAD (peripheral artery disease) ([place])   . Carotid artery disease ([place])   . Hypertension, uncontrolled 01/14/2010  . Mixed hyperlipidemia 11/11/2009  . TOBACCO ABUSE 11/11/2009    Past Surgical History:  Procedure Laterality Date  . ABDOMINAL HYSTERECTOMY     . APPENDECTOMY    . CARDIAC CATHETERIZATION  11/2010  . CARDIAC CATHETERIZATION N/A 09/16/2015   Procedure: Right Heart Cath;  Surgeon: Larey Dresser, MD;  Location: [facility];  Service: Cardiovascular;  Laterality: N/A;  . CAROTID ENDARTERECTOMY Bilateral 2011   Bilateral - Dr. Kellie Simmering  . CHOLECYSTECTOMY    . COLONOSCOPY W/ BIOPSIES AND POLYPECTOMY    . CORONARY ANGIOPLASTY WITH STENT PLACEMENT  03/2013   "1"  . CORONARY ARTERY BYPASS GRAFT  11/24/2010   Procedure: CORONARY ARTERY BYPASS GRAFTING (CABG);  Surgeon: Tharon Aquas Adelene Idler, MD;  Location: [facility];  Service: Open Heart Surgery;  Laterality: N/A;  Coronary Artery Bypass Graft times four on pump utilizing left internal mammary artery and bilateral greater saphenous veins harvested endoscopically, transesophageal echocardiogram   . DEBRIDEMENT TOE Left    "nonhealing wound; 3rd digit  . DILATION AND CURETTAGE OF UTERUS    . ENDARTERECTOMY POPLITEAL Left 10/07/2013   Procedure: LEFT POPLITEAL ENDARTERECTOMY ;  Surgeon: Angelia Mould, MD;  Location: [facility];  Service: Vascular;  Laterality: Left;  . FEMORAL-POPLITEAL BYPASS GRAFT Left 10/07/2013   Procedure: LEFT FEMORAL-POPLITEAL ARTERY BYPASS GRAFT;  Surgeon: Angelia Mould, MD;  Location: [facility];  Service: Vascular;  Laterality: Left;  . FRACTURE SURGERY    . INTRAOPERATIVE ARTERIOGRAM Left 10/07/2013   Procedure: INTRA OPERATIVE ARTERIOGRAM LEFT LEG;  Surgeon: Angelia Mould, MD;  Location: [facility];  Service: Vascular;  Laterality: Left;  . IR GENERIC HISTORICAL  09/21/2015   IR REMOVAL OF PLURAL CATH W/CUFF 09/21/2015 Marybelle Killings, MD [facility]-INTERV RAD  . LEFT AND RIGHT HEART CATHETERIZATION WITH CORONARY/GRAFT ANGIOGRAM N/A 03/27/2013   Procedure: LEFT AND RIGHT HEART CATHETERIZATION WITH Beatrix Fetters;  Surgeon: Burnell Blanks, MD;  Location: [facility] CATH LAB;  Service: Cardiovascular;  Laterality: N/A;  . LOWER EXTREMITY ANGIOGRAM  09/08/2012;  10/06/2013   "found 100% blockage; unsuccessful attempt at crossing a chronic total occlusion of the left SFA in the setting of critical limb ischemia  . LOWER EXTREMITY ANGIOGRAM Bilateral 09/08/2013   Procedure: LOWER EXTREMITY ANGIOGRAM;  Surgeon: [name], MD;  Location: [facility] CATH LAB;  Service: Cardiovascular;  Laterality: Bilateral;  . PERIPHERAL VASCULAR CATHETERIZATION  11/04/2015   Procedure: Peripheral Vascular Atherectomy;  Surgeon: [name], MD;  Location: [facility];  Service: Cardiovascular;;  RT. SFA  . PERIPHERAL VASCULAR CATHETERIZATION Bilateral 11/04/2015   Procedure: Lower Extremity Intervention;  Surgeon: [name], MD;  Location: [facility];  Service: Cardiovascular;  Laterality: Bilateral;  . PERIPHERAL VASCULAR CATHETERIZATION  11/04/2015   Procedure: Peripheral Vascular Balloon Angioplasty;  Surgeon: [name], MD;  Location: [facility];  Service: Cardiovascular;;  TP Trunk  . TOE SURGERY Left    "put pin in 2nd toe"  . TUBAL LIGATION    . WRIST FRACTURE SURGERY Left    "grafted bone from hip to wrist"     OB History  None      Home Medications    Prior to Admission medications   Medication Sig Start Date End Date Taking? Authorizing Provider  acetaminophen (TYLENOL) 500 MG tablet Take 500-1,000 mg by mouth every 6 (six) hours as needed for mild pain or moderate pain.     [provider]  albuterol (PROVENTIL HFA;VENTOLIN HFA) 108 (90 BASE) MCG/ACT inhaler Inhale 2 puffs into the lungs daily as needed for wheezing or shortness of breath.     [provider]  amLODipine (NORVASC) 10 MG tablet Take 1 tablet (10 mg total) by mouth daily. 09/25/16   Lendon Colonel, NP  aspirin 81 MG EC tablet Take 81 mg by mouth daily. 03/29/13   Lyda Jester M, PA-C  atorvastatin (LIPITOR) 40 MG tablet Take 1 tablet (40 mg total) by mouth daily at 6 PM. 09/25/16   Lendon Colonel, NP  citalopram  (CELEXA) 40 MG tablet Take 40 mg by mouth every morning.  05/20/15   [provider]  clopidogrel (PLAVIX) 75 MG tablet TAKE ONE TABLET BY MOUTH ONCE DAILY WITH  BREAKFAST 09/25/16   Lendon Colonel, NP  clotrimazole-betamethasone (LOTRISONE) cream Apply 1 application topically 2 (two) times daily as needed. 01/24/16   [provider]  Docusate Calcium (STOOL SOFTENER PO) Take 200 mg by mouth at bedtime as needed (for constipation).     [provider]  gabapentin (NEURONTIN) 300 MG capsule Take 300-600 mg by mouth See admin instructions. 300 mg in the morning and 600 mg prior to bed(time) 08/02/14   [provider]  hydrALAZINE (APRESOLINE) 25 MG tablet Take 3 tablets (75 mg total) by mouth every 8 (eight) hours. 09/25/16   Lendon Colonel, NP  insulin detemir (LEVEMIR) 100 UNIT/ML injection Inject 10 Units into the skin daily with breakfast.    [provider]  isosorbide mononitrate (IMDUR) 30 MG 24 hr tablet Take 1 tablet (30 mg total) by mouth daily. 09/25/16   Lendon Colonel, NP  JANUVIA 25 MG tablet TAKE 1 TABLET BY MOUTH ONCE DAILY 03/22/17   Cassandria Anger, MD  levothyroxine (SYNTHROID, LEVOTHROID) 88 MCG tablet Take 1 tablet (88 mcg total) by mouth daily before breakfast. 10/27/16   Nida, Marella Chimes, MD  losartan (COZAAR) 25 MG tablet Take 0.5 tablets (12.5 mg total) by mouth daily. 09/25/16 12/24/16  Lendon Colonel, NP  Multiple Vitamin (MULTIVITAMIN WITH MINERALS) TABS tablet Take 1 tablet by mouth daily.    [provider]  nitroGLYCERIN (NITROSTAT) 0.4 MG SL tablet Place 1 tablet (0.4 mg total) under the tongue every 5 (five) minutes as needed for chest pain. Up to 3 doses. If no relief after 3rd dose, proceed to the ED for an evaluation 09/30/14   Satira Sark, MD  nystatin (MYCOSTATIN/NYSTOP) powder Apply 1 application topically 2 (two) times daily as needed. 01/24/16   [provider]  potassium  chloride SA (K-DUR,KLOR-CON) 20 MEQ tablet Take 1 tablet (20 mEq total) by mouth 2 (two) times daily. 09/25/16   Lendon Colonel, NP  spironolactone (ALDACTONE) 25 MG tablet Take 0.5 tablets (12.5 mg total) by mouth daily. 09/25/16   Lendon Colonel, NP  tiZANidine (ZANAFLEX) 4 MG tablet Take 4 mg by mouth 2 (two) times daily as needed for muscle spasms.    [provider]  torsemide (DEMADEX) 20 MG tablet TAKE 3 TABLETS BY MOUTH IN THE MORNING AND 2 IN THE EVENING MAY  TAKE  AN  ADDITIONAL  TABLET  IF  WEIGHT  INCREASES 03/21/17   Satira Sark, MD  traMADol Veatrice Bourbon) 50 MG tablet Take 50 mg by mouth. 08/28/16   [provider]  umeclidinium-vilanterol (ANORO ELLIPTA) 62.5-25 MCG/INH AEPB Inhale 1 puff into the lungs daily.    [provider]    Family History Family History  Problem Relation Age of Onset  . Coronary artery disease Father        Died with MI age 107  . Heart disease Father        before age 66  . Heart attack Father   . Diabetes type II Mother   . Hypertension Mother   . Diabetes Mother   . Heart disease Mother   . Hyperlipidemia Mother   . Heart failure Sister   . Cancer Sister   . Cancer Brother        Lung cancer  . Diabetes Daughter   . Hyperlipidemia Daughter   . Diabetes Son   . Hyperlipidemia Son     Social History Social History   Tobacco Use  . Smoking status: Former Smoker    Packs/day: 3.00    Years: 50.00    Pack years: 150.00    Types: Cigarettes    Start date: 01/24/1956    Last attempt to quit: 10/09/2009    Years since quitting: 7.4  . Smokeless tobacco: Never Used  Substance Use Topics  . Alcohol use: Yes    Alcohol/week: 0.0 oz    Comment: 10/06/2013 "might have a drink q 2-3 months"  . Drug use: Yes    Types: Marijuana    Comment: "smoked pot in my teens"     Allergies   Other; Strawberry extract; Sulfa antibiotics; Coffee bean extract [coffea arabica]; and Tape   Review of Systems Review of  Systems  Respiratory: Positive for shortness of breath.   Cardiovascular: Negative for chest pain and leg swelling.  All other systems reviewed and are negative.    Physical Exam Updated Vital Signs BP (!) 132/42   Pulse (!) 54   Temp 97.9 F (36.6 C) (Oral)   Resp 13   Ht _0  (1.676 m)   Wt 88.5 kg (195 lb)   SpO2 100%   BMI 31.47 kg/m   Physical Exam  Constitutional: She appears well-developed and well-nourished. No distress.  HENT:  Head: Normocephalic and atraumatic.  Mouth/Throat: Oropharynx is clear and moist. No oropharyngeal exudate.  Eyes: Pupils are equal, round, and reactive to light. Conjunctivae and EOM are normal. Right eye exhibits no discharge. Left eye exhibits no discharge. No scleral icterus.  Neck: Normal range of motion. Neck supple. No JVD present. No thyromegaly present.  Cardiovascular: Regular rhythm, normal heart sounds and intact distal pulses. Exam reveals no gallop and no friction rub.  No murmur heard. Mild bradycardia at 55-57 bpm, normal pulses, JVD is present  Pulmonary/Chest: She is in respiratory distress. She has no wheezes. She has rales.  Abdominal: Soft. Bowel sounds are normal. She exhibits no distension and no mass. There is no tenderness.  The abdomen is nondistended, there is no pitting edema to the abdominal wall  Musculoskeletal: Normal range of motion. She exhibits edema ( Scant bilateral lower extremity edema). She exhibits no tenderness.  Lymphadenopathy:    She has no cervical adenopathy.  Neurological: She is alert. Coordination normal.  Skin: Skin is warm and dry. No rash noted. No erythema.  Hyperpigmentation to the bilateral lower extremities, symmetrical, chronic  Psychiatric: She has a normal mood and affect. Her behavior is normal.  Nursing note and vitals reviewed.    ED Treatments / Results  Labs (all labs ordered are listed, but only abnormal results are displayed) Labs Reviewed  CBC WITH  DIFFERENTIAL/PLATELET - Abnormal; Notable for the following components:      Result Value   RBC 3.45 (*)    Hemoglobin 10.1 (*)    HCT 32.6 (*)    All other components within normal limits  COMPREHENSIVE METABOLIC PANEL - Abnormal; Notable for the following components:   Chloride 98 (*)    Glucose, Bld 203 (*)    BUN 72 (*)    Creatinine, Ser 1.57 (*)    GFR calc non Af Amer 33 (*)    GFR calc Af Amer 38 (*)    All other components within normal limits  BRAIN NATRIURETIC PEPTIDE - Abnormal; Notable for the following components:   B Natriuretic Peptide 475.0 (*)    All other components within normal limits  TROPONIN I      EKG Interpretation  Date/Time:  Saturday March 31 2017 13:17:24 EDT Ventricular Rate:  57 PR Interval:    QRS Duration: 110 QT Interval:  490 QTC Calculation: 478 R Axis:   82 Text Interpretation:  Sinus rhythm / Sinus Brady, Borderline right axis deviation Baseline wander in lead(s) III aVL aVF V2 since last tracing no significant change Confirmed by Noemi Chapel [phone]) on 03/31/2017 1:20:48 PM        Radiology Dg Chest 2 View  Result Date: 03/31/2017 CLINICAL DATA:  Shortness of breath and weight gain. EXAM: CHEST - 2 VIEW COMPARISON:  07/27/2016. FINDINGS: Patient is rotated. Trachea is midline. Heart is enlarged. Mixed interstitial and airspace opacification bilaterally. No definite pleural fluid. IMPRESSION: Pulmonary edema. Electronically Signed   By: Lorin Picket M.D.   On: 03/31/2017 14:21    Procedures .Critical Care Performed by: Noemi Chapel, MD Authorized by: Noemi Chapel, MD   Critical care provider statement:    Critical care time (minutes):  35   Critical care time was exclusive of:  Separately billable procedures and treating other patients and teaching time   Critical care was necessary to treat or prevent imminent or life-threatening deterioration of the following conditions:  Cardiac failure (hypoxia and pulmonary edema)    Critical care was time spent personally by me on the following activities:  Blood draw for specimens, development of treatment plan with patient or surrogate, discussions with consultants, evaluation of patient's response to treatment, examination of patient, obtaining history from patient or surrogate, ordering and performing treatments and interventions, ordering and review of laboratory studies, ordering and review of radiographic studies, pulse oximetry, re-evaluation of patient's condition and review of old charts   (including critical care time)  Medications Ordered in ED Medications  furosemide (LASIX) injection 80 mg (has no administration in time range)     Initial Impression / Assessment and Plan / ED Course  I have reviewed the triage vital signs and the nursing notes.  Pertinent labs & imaging results that were available during my care of the patient were reviewed by me and considered in my medical decision making (see chart for details).  Clinical Course as of Mar 31 1441  Sat Mar 31, 2017  1438 Personally reviewed the labs in my interpretation shows that the patient has a creatinine that is 1.57, her hemoglobin is 10   [BM]  1438 Creatinine(!): 1.57 [BM]  1438 HemoglobinMarland Kitchen):  10.1 [BM]  1438 Both labs are at baseline BNP is 475 BP is 132 / 42 Lasix ordered    [BM]  1439 Personally viewed the x-ray, there does appear to be pulmonary edema bilaterally.  Lasix has been ordered, will consult with hospitalist for admission given the increased oxygen requirement   [BM]    Clinical Course User Index [BM] Noemi Chapel, MD   The patient's oxygen was mid 70% when she first arrived, she has signs of pulmonary edema on her exam, she does not appear to be in distress and has no peripheral edema.  X-ray obtained,  Electronic medical record reviewed, last echocardiogram was obtained in June 2018, at that time she had an ejection fraction of 57-47% grade 2 diastolic dysfunction.   Notes reviewed from Dr. Algernon Huxley with cardiology and CHF service.  She is known to have a chronic right pleural effusion, stage III kidney disease and peripheral artery disease (has had carotid endarterectomies bilaterally).  Currently the patient is on Plavix, torsemide, 60 mg by mouth twice daily and spironolactone half a tablet a day  On the patient's usual 2 L by nasal cannula her oxygen was less than 80% requiring high flow nasal cannula and diuresis.  D/w Dr. Jerilee Hoh - will admit  Final Clinical Impressions(s) / ED Diagnoses   Final diagnoses:  Acute pulmonary edema ([place])      Noemi Chapel, MD 03/31/17 [phone]

## 2017-03-31 NOTE — Progress Notes (Signed)
Late entry:  Dr. Jerilee Hoh notified via text page of patient's arrival to unit.

## 2017-04-01 ENCOUNTER — Other Ambulatory Visit: Payer: Self-pay

## 2017-04-01 LAB — BASIC METABOLIC PANEL
Anion gap: 11 (ref 5–15)
BUN: 72 mg/dL — ABNORMAL HIGH (ref 6–20)
CALCIUM: 8.9 mg/dL (ref 8.9–10.3)
CO2: 30 mmol/L (ref 22–32)
CREATININE: 1.59 mg/dL — AB (ref 0.44–1.00)
Chloride: 94 mmol/L — ABNORMAL LOW (ref 101–111)
GFR calc Af Amer: 37 mL/min — ABNORMAL LOW (ref >60)
GFR calc non Af Amer: 32 mL/min — ABNORMAL LOW (ref >60)
GLUCOSE: 149 mg/dL — AB (ref 65–99)
Potassium: 4.4 mmol/L (ref 3.5–5.1)
Sodium: 135 mmol/L (ref 135–145)

## 2017-04-01 LAB — GLUCOSE, CAPILLARY
GLUCOSE-CAPILLARY: 149 mg/dL — AB (ref 65–99)
Glucose-Capillary: 136 mg/dL — ABNORMAL HIGH (ref 65–99)
Glucose-Capillary: 89 mg/dL (ref 65–99)
Glucose-Capillary: 140 mg/dL — ABNORMAL HIGH (ref 65–99)

## 2017-04-01 MED ORDER — LIVING BETTER WITH HEART FAILURE BOOK
Freq: Once | Status: AC
  Administered 2017-04-01: 18:00:00

## 2017-04-01 MED ORDER — ORAL CARE MOUTH RINSE
15.0000 mL | Freq: Two times a day (BID) | OROMUCOSAL | Status: DC
  Administered 2017-04-02 – 2017-04-04 (×6): 15 mL via OROMUCOSAL

## 2017-04-01 NOTE — Progress Notes (Signed)
Patient refused Anoro Ellilpta DPI claiming it gives her headaches and makes her nauseous. BBS diminished and SpO2 98% on 3lpm [place](humidity had been added.)

## 2017-04-01 NOTE — Progress Notes (Signed)
Late entry:  Patient c/o of nausea after evening meal.  Zofran given.  Patient reported Zofran effective.

## 2017-04-01 NOTE — Progress Notes (Signed)
PROGRESS NOTE    [name] [name] Vue  OTL:[id number] DOB: [birth year] DOA: 03/31/2017 PCP: Glenda Chroman, MD     Brief Narrative:  72 y/o female who came to the hospital on 3/23 with a 3-4-day history of progressive SOB and abdominal edema.  Patient has Chronic Kidney Disease stage 3, with a creatinine at 1.59 which is her baseline Patient has a nasal canula on 3L O2 with a SpO2 98%.    Assessment & Plan:   Principal Problem:   Acute on chronic diastolic CHF (congestive heart failure) (HCC) Active Problems:   Diabetes mellitus type II, uncontrolled (HCC)   PVD (peripheral vascular disease) (HCC)   CKD (chronic kidney disease) stage 3, GFR 30-59 ml/min (HCC)   Acquired hypothyroidism   Class 1 obesity due to excess calories with serious comorbidity and body mass index (BMI) of 31.0 to 31.9 in adult  Acute on chronic hypoxemic respiratory failure -Due to acute on chronic diastolic heart failure. -She is chronically on 2 L of oxygen and is currently requiring 3 L. -Continue Lasix at a dose of 80 mg IV twice daily (she was on 60 mg of torsemide twice daily). Still appears somewhat hypervolemic on exam. -Patient is 1.8 L negative since admission, continue to strive for negative fluid balance. -Continue beta-blocker and ARB. -Continue aspirin, statin, spironolactone.  Type 2 diabetes -Fair control. CBGs: 136-169 -A1C at 6.2, continue Lantus, SSI and Januvia.  Chronic kidney disease stage III-IV -Baseline creatinine is around 1.5, she is currently at her baseline, 1.59 today  Hypothyroidism -TSH at 3.5 -Continue home dose of Synthroid.  Hypertension -Currently well controlled, monitor on home medications.  Peripheral artery disease -Status post bilateral carotid endarterectomies. -Continue aspirin and Plavix.    DVT prophylaxis: Lovenox Code Status: Full code Family Communication: patient only Disposition Plan: Home when medically stable, anticipate 24-48  hours  Consultants:   None  Procedures:   None  Antimicrobials:  Anti-infectives (From admission, onward)   None       Subjective: Patient reports feeling better than yesterday, with no shortness of breath  Objective: Vitals:   03/31/17 2034 03/31/17 2300 04/01/17 0429 04/01/17 0515  BP:    (!) 108/42  Pulse:    (!) 55  Resp:    18  Temp:   98.7 F (37.1 C) 98.1 F (36.7 C)  TempSrc:   Oral Oral  SpO2: 94% 98%  95%  Weight:   88.2 kg (194 lb 7.1 oz)   Height:        Intake/Output Summary (Last 24 hours) at 04/01/2017 1436 Last data filed at 04/01/2017 0432 Gross per 24 hour  Intake 450 ml  Output 2276 ml  Net -1826 ml   Filed Weights   03/31/17 1302 03/31/17 1618 04/01/17 0429  Weight: 88.5 kg (195 lb) 89.1 kg (196 lb 6.9 oz) 88.2 kg (194 lb 7.1 oz)    Examination:  General exam: Alert, awake and oriented x 3. In no acute distress. Calm and comfortable. Respiratory system: Chest clear to auscultation. No use of accessory muscles. No wheezing, crackles or rhonchi Cardiovascular system:RRR. S1 and S2 hear. No murmurs, gallops or rubs. Gastrointestinal system: Abdomen is non distended, non tender to palpation. No hepatosplenomegaly. Positive BS x 4 quadrants. Central nervous system: Alert and oriented. No focal neurological deficits. CN 2-12 grossly intact Extremities: No cyanosis, clubbing or edema, +pedal pulses Skin: No rashes, ulcers, or lesions Psychiatry: Judgement and insight appear normal. No hallucinations. Normal mood  Data Reviewed: I have personally reviewed following labs and imaging studies  CBC: Recent Labs  Lab 03/31/17 1333  WBC 8.5  NEUTROABS 7.1  HGB 10.1*  HCT 32.6*  MCV 94.5  PLT 754   Basic Metabolic Panel: Recent Labs  Lab 03/31/17 1333 04/01/17 0608  NA 135 135  K 4.7 4.4  CL 98* 94*  CO2 25 30  GLUCOSE 203* 149*  BUN 72* 72*  CREATININE 1.57* 1.59*  CALCIUM 9.2 8.9   GFR: Estimated Creatinine Clearance: 37.4  mL/min (A) (by C-G formula based on SCr of 1.59 mg/dL (H)). Liver Function Tests: Recent Labs  Lab 03/31/17 1333  AST 21  ALT 16  ALKPHOS 79  BILITOT 0.4  PROT 8.0  ALBUMIN 3.9   No results for input(s): LIPASE, AMYLASE in the last 168 hours. No results for input(s): AMMONIA in the last 168 hours. Coagulation Profile: No results for input(s): INR, PROTIME in the last 168 hours. Cardiac Enzymes: Recent Labs  Lab 03/31/17 1333  TROPONINI <0.03   BNP (last 3 results) No results for input(s): PROBNP in the last 8760 hours. HbA1C: Recent Labs    03/31/17 1333  HGBA1C 6.2*   CBG: Recent Labs  Lab 03/31/17 2300 04/01/17 0757 04/01/17 1134  GLUCAP 169* 149* 136*   Lipid Profile: No results for input(s): CHOL, HDL, LDLCALC, TRIG, CHOLHDL, LDLDIRECT in the last 72 hours. Thyroid Function Tests: Recent Labs    03/31/17 1333  TSH 3.573   Anemia Panel: No results for input(s): VITAMINB12, FOLATE, FERRITIN, TIBC, IRON, RETICCTPCT in the last 72 hours. Urine analysis:    Component Value Date/Time   COLORURINE YELLOW 10/10/2014 [place] 10/10/2014 2345   LABSPEC 1.010 10/10/2014 2345   PHURINE 6.0 10/10/2014 2345   GLUCOSEU NEGATIVE 10/10/2014 2345   HGBUR TRACE (A) 10/10/2014 2345   BILIRUBINUR NEGATIVE 10/10/2014 2345   KETONESUR NEGATIVE 10/10/2014 2345   PROTEINUR 30 (A) 10/10/2014 2345   UROBILINOGEN 0.2 10/10/2014 2345   NITRITE NEGATIVE 10/10/2014 2345   LEUKOCYTESUR NEGATIVE 10/10/2014 2345   Sepsis Labs: _0 (procalcitonin:4,lacticidven:4)  )No results found for this or any previous visit (from the past 240 hour(s)).       Radiology Studies: Dg Chest 2 View  Result Date: 03/31/2017 CLINICAL DATA:  Shortness of breath and weight gain. EXAM: CHEST - 2 VIEW COMPARISON:  07/27/2016. FINDINGS: Patient is rotated. Trachea is midline. Heart is enlarged. Mixed interstitial and airspace opacification bilaterally. No definite pleural  fluid. IMPRESSION: Pulmonary edema. Electronically Signed   By: Lorin Picket M.D.   On: 03/31/2017 14:21        Scheduled Meds: . amLODipine  10 mg Oral Daily  . aspirin EC  81 mg Oral Daily  . atorvastatin  40 mg Oral q1800  . carvedilol  12.5 mg Oral BID WC  . citalopram  20 mg Oral BH-q7a  . clopidogrel  75 mg Oral Daily  . enoxaparin (LOVENOX) injection  40 mg Subcutaneous Q24H  . furosemide  80 mg Intravenous BID  . gabapentin  300 mg Oral Daily  . gabapentin  600 mg Oral QHS  . hydrALAZINE  75 mg Oral Q8H  . insulin aspart  0-15 Units Subcutaneous TID WC  . insulin aspart  0-5 Units Subcutaneous QHS  . insulin aspart  4 Units Subcutaneous TID WC  . insulin detemir  10 Units Subcutaneous Q breakfast  . isosorbide mononitrate  30 mg Oral Daily  . levothyroxine  75 mcg Oral  QAC breakfast  . linagliptin  5 mg Oral Daily  . Living Better with Heart Failure Book   Does not apply Once  . losartan  12.5 mg Oral Daily  . mouth rinse  15 mL Mouth Rinse BID  . multivitamin with minerals  1 tablet Oral Daily  . potassium chloride SA  20 mEq Oral BID  . sodium chloride flush  3 mL Intravenous Q12H  . spironolactone  12.5 mg Oral Daily  . umeclidinium-vilanterol  1 puff Inhalation Daily   Continuous Infusions: . sodium chloride       LOS: 1 day    Time spent: 25 minutes. Greater than 50% of this time was spent in direct contact with the patient coordinating care.     Lelon Frohlich, MD [facility] Pager [phone]  If 7PM-7AM, please contact night-coverage [email or web address] Password [facility] 04/01/2017, 2:36 PM

## 2017-04-01 NOTE — Progress Notes (Signed)
Late entry:  Dr. Jerilee Hoh notified of patient's blood pressure and pulse this morning.  Dr. Jerilee Hoh to hold the Coreg, Imdur, Spironolactone, Cozaar, Norvasc but give the Lasix this morning.

## 2017-04-02 LAB — GLUCOSE, CAPILLARY
GLUCOSE-CAPILLARY: 166 mg/dL — AB (ref 65–99)
GLUCOSE-CAPILLARY: 119 mg/dL — AB (ref 65–99)
GLUCOSE-CAPILLARY: 101 mg/dL — AB (ref 65–99)
Glucose-Capillary: 195 mg/dL — ABNORMAL HIGH (ref 65–99)
Glucose-Capillary: 196 mg/dL — ABNORMAL HIGH (ref 65–99)

## 2017-04-02 LAB — BASIC METABOLIC PANEL
ANION GAP: 11 (ref 5–15)
BUN: 87 mg/dL — ABNORMAL HIGH (ref 6–20)
CALCIUM: 8.9 mg/dL (ref 8.9–10.3)
CHLORIDE: 98 mmol/L — AB (ref 101–111)
CO2: 29 mmol/L (ref 22–32)
CREATININE: 1.93 mg/dL — AB (ref 0.44–1.00)
GFR calc non Af Amer: 25 mL/min — ABNORMAL LOW (ref >60)
GFR, EST AFRICAN AMERICAN: 29 mL/min — AB (ref >60)
Glucose, Bld: 118 mg/dL — ABNORMAL HIGH (ref 65–99)
Potassium: 4.4 mmol/L (ref 3.5–5.1)
SODIUM: 138 mmol/L (ref 135–145)

## 2017-04-02 MED ORDER — OXYMETAZOLINE HCL 0.05 % NA SOLN: 3.0000 | Freq: Two times a day (BID) | NASAL | Status: DC | PRN

## 2017-04-02 MED ORDER — TORSEMIDE 20 MG PO TABS
60.0000 mg | ORAL_TABLET | Freq: Two times a day (BID) | ORAL | Status: DC
  Administered 2017-04-02 – 2017-04-04 (×5): 60 mg via ORAL
  Filled 2017-04-02 (×6): qty 3

## 2017-04-02 NOTE — Progress Notes (Signed)
Upon entrance into room during change of shift, pt having a major nose bleed d/t oxygen with multiple tissues and wash cloths covered in blood. Tylene Fantasia, NP paged to see if pts Lovenox can be discontinued. Possibly order SCDs. Waiting for orders/call back.

## 2017-04-02 NOTE — Progress Notes (Signed)
PROGRESS NOTE    [name] [name]  PXT:[id number] DOB: 05/12/1945 DOA: 03/31/2017 PCP: Glenda Chroman, MD     Brief Narrative:  72 y/o female who came to the hospital on 3/23 with a 3-4-day history of progressive SOB and abdominal edema.  Patient has Chronic Kidney Disease stage 3, with a creatinine at 1.59 which is her baseline Patient has a nasal canula on 3L O2 with a SpO2 98%.    Assessment & Plan:   Principal Problem:   Acute on chronic diastolic CHF (congestive heart failure) (HCC) Active Problems:   Diabetes mellitus type II, uncontrolled (HCC)   PVD (peripheral vascular disease) (HCC)   CKD (chronic kidney disease) stage 3, GFR 30-59 ml/min (HCC)   Acquired hypothyroidism   Class 1 obesity due to excess calories with serious comorbidity and body mass index (BMI) of 31.0 to 31.9 in adult  Acute on chronic hypoxemic respiratory failure -Due to acute on chronic diastolic heart failure. -She is chronically on 2 L of oxygen and is currently at her baseline 2 L. -Volume status is improved and Cr is starting to increase, so will DC IV lasix and put back on home dose torsemide 60 mg BID. -Patient is 3 L negative since admission, continue to strive for negative fluid balance. -Continue beta-blocker and ARB. -Continue aspirin, statin, spironolactone.  Type 2 diabetes -Good control. CBGs: 89-140 -A1C at 6.2, continue Lantus, SSI and Januvia.  Chronic kidney disease stage III-IV -Baseline creatinine is around 1.5. -Cr has increased to 1.9 today. -DC IV diuretics today.  Hypothyroidism -TSH at 3.5 -Continue home dose of Synthroid.  Hypertension -Currently well controlled, monitor on home medications.  Peripheral artery disease -Status post bilateral carotid endarterectomies. -Continue aspirin and Plavix.    DVT prophylaxis: Lovenox Code Status: Full code Family Communication: patient only Disposition Plan: Home when medically stable, anticipate 24  hours  Consultants:   None  Procedures:   None  Antimicrobials:  Anti-infectives (From admission, onward)   None       Subjective: Feels well other than tired, Denies CP or SOB.  Objective: Vitals:   04/01/17 2234 04/02/17 0456 04/02/17 0504 04/02/17 1500  BP: (!) 140/44 (!) 160/12 (!) 150/52 (!) 125/52  Pulse: (!) 55 (!) 52  (!) 54  Resp: _0 Temp: 98.6 F (37 C) 98.5 F (36.9 C)    TempSrc: Oral Oral    SpO2: 100% 96%    Weight:  90.4 kg (199 lb 4.7 oz)    Height:        Intake/Output Summary (Last 24 hours) at 04/02/2017 1512 Last data filed at 04/02/2017 1300 Gross per 24 hour  Intake 600 ml  Output 1100 ml  Net -500 ml   Filed Weights   03/31/17 1618 04/01/17 0429 04/02/17 0456  Weight: 89.1 kg (196 lb 6.9 oz) 88.2 kg (194 lb 7.1 oz) 90.4 kg (199 lb 4.7 oz)    Examination:  General exam: Alert, awake, oriented x 3, wearing a left eye patch but will not let me examen her eye. Respiratory system: Clear to auscultation. Respiratory effort normal. Cardiovascular system:RRR. No murmurs, rubs, gallops. Gastrointestinal system: Abdomen is nondistended, soft and nontender. No organomegaly or masses felt. Normal bowel sounds heard. Central nervous system: Alert and oriented. No focal neurological deficits. Extremities: trace bilateral edema, +pedal pulses Skin: No rashes, lesions or ulcers Psychiatry: Judgement and insight appear normal. Mood & affect appropriate.      Data Reviewed: I have  personally reviewed following labs and imaging studies  CBC: Recent Labs  Lab 03/31/17 1333  WBC 8.5  NEUTROABS 7.1  HGB 10.1*  HCT 32.6*  MCV 94.5  PLT 893   Basic Metabolic Panel: Recent Labs  Lab 03/31/17 1333 04/01/17 0608 04/02/17 0604  NA 135 135 138  K 4.7 4.4 4.4  CL 98* 94* 98*  CO2 _0 GLUCOSE 203* 149* 118*  BUN 72* 72* 87*  CREATININE 1.57* 1.59* 1.93*  CALCIUM 9.2 8.9 8.9   GFR: Estimated Creatinine Clearance: 31.1 mL/min  (A) (by C-G formula based on SCr of 1.93 mg/dL (H)). Liver Function Tests: Recent Labs  Lab 03/31/17 1333  AST 21  ALT 16  ALKPHOS 79  BILITOT 0.4  PROT 8.0  ALBUMIN 3.9   No results for input(s): LIPASE, AMYLASE in the last 168 hours. No results for input(s): AMMONIA in the last 168 hours. Coagulation Profile: No results for input(s): INR, PROTIME in the last 168 hours. Cardiac Enzymes: Recent Labs  Lab 03/31/17 1333  TROPONINI <0.03   BNP (last 3 results) No results for input(s): PROBNP in the last 8760 hours. HbA1C: Recent Labs    03/31/17 1333  HGBA1C 6.2*   CBG: Recent Labs  Lab 04/01/17 1134 04/01/17 1623 04/01/17 2224 04/02/17 0742 04/02/17 1111  GLUCAP 136* 89 140* 119* 195*   Lipid Profile: No results for input(s): CHOL, HDL, LDLCALC, TRIG, CHOLHDL, LDLDIRECT in the last 72 hours. Thyroid Function Tests: Recent Labs    03/31/17 1333  TSH 3.573   Anemia Panel: No results for input(s): VITAMINB12, FOLATE, FERRITIN, TIBC, IRON, RETICCTPCT in the last 72 hours. Urine analysis:    Component Value Date/Time   COLORURINE YELLOW 10/10/2014 [place] 10/10/2014 2345   LABSPEC 1.010 10/10/2014 2345   PHURINE 6.0 10/10/2014 2345   GLUCOSEU NEGATIVE 10/10/2014 2345   HGBUR TRACE (A) 10/10/2014 2345   BILIRUBINUR NEGATIVE 10/10/2014 2345   KETONESUR NEGATIVE 10/10/2014 2345   PROTEINUR 30 (A) 10/10/2014 2345   UROBILINOGEN 0.2 10/10/2014 2345   NITRITE NEGATIVE 10/10/2014 2345   LEUKOCYTESUR NEGATIVE 10/10/2014 2345   Sepsis Labs: _1 (procalcitonin:4,lacticidven:4)  )No results found for this or any previous visit (from the past 240 hour(s)).       Radiology Studies: No results found.      Scheduled Meds: . amLODipine  10 mg Oral Daily  . aspirin EC  81 mg Oral Daily  . atorvastatin  40 mg Oral q1800  . carvedilol  12.5 mg Oral BID WC  . citalopram  20 mg Oral BH-q7a  . clopidogrel  75 mg Oral Daily  .  enoxaparin (LOVENOX) injection  40 mg Subcutaneous Q24H  . gabapentin  300 mg Oral Daily  . gabapentin  600 mg Oral QHS  . hydrALAZINE  75 mg Oral Q8H  . insulin aspart  0-15 Units Subcutaneous TID WC  . insulin aspart  0-5 Units Subcutaneous QHS  . insulin aspart  4 Units Subcutaneous TID WC  . insulin detemir  10 Units Subcutaneous Q breakfast  . isosorbide mononitrate  30 mg Oral Daily  . levothyroxine  75 mcg Oral QAC breakfast  . linagliptin  5 mg Oral Daily  . losartan  12.5 mg Oral Daily  . mouth rinse  15 mL Mouth Rinse BID  . multivitamin with minerals  1 tablet Oral Daily  . potassium chloride SA  20 mEq Oral BID  . sodium chloride flush  3 mL Intravenous  Q12H  . spironolactone  12.5 mg Oral Daily  . torsemide  60 mg Oral BID  . umeclidinium-vilanterol  1 puff Inhalation Daily   Continuous Infusions: . sodium chloride       LOS: 2 days    Time spent: 25 minutes. Greater than 50% of this time was spent in direct contact with the patient coordinating care.     Lelon Frohlich, MD [facility] Pager [phone]  If 7PM-7AM, please contact night-coverage [email or web address] Password [facility] 04/02/2017, 3:12 PM

## 2017-04-02 NOTE — Plan of Care (Signed)
progressing 

## 2017-04-02 NOTE — Care Management Important Message (Signed)
Important Message  Patient Details  Name: [name] [name] MRN: [id number] Date of Birth: 08/01/1945   Medicare Important Message Given:       Shelda Altes 04/02/2017, 12:53 PM

## 2017-04-02 NOTE — Progress Notes (Signed)
Pt stated she had wet the bed. Her purewick was leaking. Pt cleaned up/bathed and sheets changed. Output unable to be measured, documented as an occurrence. Will continue to monitor

## 2017-04-02 NOTE — Progress Notes (Signed)
Lovenox d/c'd due to continued nose bleeds. Afrin and SCDs ordered. KJKG, NP [facility]

## 2017-04-02 NOTE — Care Management Important Message (Signed)
Important Message  Patient Details  Name: [name] [name] MRN: [id number] Date of Birth: 03/05/1945   Medicare Important Message Given:  Yes    Sherald Barge, RN 04/02/2017, 2:50 PM

## 2017-04-02 NOTE — Care Management Important Message (Signed)
Important Message  Patient Details  Name: RILYN SCROGGS MRN: [id number] Date of Birth: [birth year]   Medicare Important Message Given:       Shelda Altes 04/02/2017, 12:53 PM

## 2017-04-02 NOTE — Progress Notes (Signed)
New order to discontinue Lovenox and start pt on SCDs. Will continue to monitor

## 2017-04-03 LAB — BASIC METABOLIC PANEL
ANION GAP: 13 (ref 5–15)
BUN: 104 mg/dL — AB (ref 6–20)
CO2: 27 mmol/L (ref 22–32)
Calcium: 8.8 mg/dL — ABNORMAL LOW (ref 8.9–10.3)
Chloride: 94 mmol/L — ABNORMAL LOW (ref 101–111)
Creatinine, Ser: 2.33 mg/dL — ABNORMAL HIGH (ref 0.44–1.00)
GFR calc Af Amer: 23 mL/min — ABNORMAL LOW (ref >60)
GFR calc non Af Amer: 20 mL/min — ABNORMAL LOW (ref >60)
GLUCOSE: 132 mg/dL — AB (ref 65–99)
Potassium: 4.2 mmol/L (ref 3.5–5.1)
Sodium: 134 mmol/L — ABNORMAL LOW (ref 135–145)

## 2017-04-03 LAB — GLUCOSE, CAPILLARY
GLUCOSE-CAPILLARY: 143 mg/dL — AB (ref 65–99)
GLUCOSE-CAPILLARY: 223 mg/dL — AB (ref 65–99)
Glucose-Capillary: 159 mg/dL — ABNORMAL HIGH (ref 65–99)
Glucose-Capillary: 156 mg/dL — ABNORMAL HIGH (ref 65–99)

## 2017-04-03 NOTE — Plan of Care (Signed)
  Problem: Acute Rehab PT Goals(only PT should resolve) Goal: Pt Will Go Supine/Side To Sit Outcome: Progressing Flowsheets (Taken 04/03/2017 1533) Pt will go Supine/Side to Sit: with modified independence Goal: Patient Will Transfer Sit To/From Stand Outcome: Progressing Flowsheets (Taken 04/03/2017 1533) Patient will transfer sit to/from stand: with modified independence Goal: Pt Will Transfer Bed To Chair/Chair To Bed Outcome: Progressing Flowsheets (Taken 04/03/2017 1533) Pt will Transfer Bed to Chair/Chair to Bed: with modified independence Goal: Pt Will Ambulate Outcome: Progressing Flowsheets (Taken 04/03/2017 1533) Pt will Ambulate: 75 feet;with supervision;with rolling walker  3:33 PM, 04/03/17 Lonell Grandchild, MPT Physical Therapist with [facility] [phone] office [phone] mobile phone

## 2017-04-03 NOTE — Progress Notes (Signed)
Pts Hydralazine held d/t her BP of 106/31- RN rechecked BP=110/50. Will pass on to day shift RN

## 2017-04-03 NOTE — Plan of Care (Signed)
End of shift summary: Patient has had an uneventful shift. Patient remains alert and oriented x 3 and is easily reoriented.  Her diastolic blood pressure and pulse have been running low. MD aware and has adjusted patient's medication regimen. Patient has not had any adverse reactions to any medications. She has been ambulating x 1 assist. She has been voiding appropriately. No signs and symptoms of distress noted, and no concerns voiced at this time.

## 2017-04-03 NOTE — Progress Notes (Signed)
Pt stating her feet hurt from neuropathy- given Gabapentin 625m as night time dose. Dr. OOlevia Bowenspaged and made aware. Waiting for orders.

## 2017-04-03 NOTE — Evaluation (Signed)
Physical Therapy Evaluation Patient Details Name: ZAKAIYA LARES MRN: [id number] DOB: March 08, 1945 Today's Date: 04/03/2017   History of Present Illness  [name] [name] is a 72 y.o. female with history of chronic diastolic heart failure, peripheral artery disease with carotid artery disease and bilateral carotid endarterectomies, depression, hypertension, hypothyroidism among other issues who presents to the hospital today with a 3-4-day history of progressive shortness of breath and abdominal edema.  Patient states she has had to increase her home oxygen from 2-5.  Her daughter became concerned when she saw her today and noticed her significant increased abdominal girth and urged her to come to the hospital for evaluation.  In the ED her vital signs are stable, labs are significant for a creatinine of 1.57 which is her baseline, BNP of 475, hemoglobin of 10 which is her baseline.  Chest x-ray with pulmonary edema.  Admission is requested for treatment of acute exacerbation of heart failure.    Clinical Impression  Patient presents seated in chair (assisted by nursing staff), functioning near baseline for functional mobility and gait, other than demonstrating slightly labored functional movement for sitting up and taking steps in hallway, no loss of balance, mostly limited due to fatigue.  Patient will benefit from continued physical therapy in hospital and recommended venue below to increase strength, balance, endurance for safe ADLs and gait.    Follow Up Recommendations Home health PT;Supervision for mobility/OOB    Equipment Recommendations  None recommended by PT    Recommendations for Other Services       Precautions / Restrictions Precautions Precautions: Fall Restrictions Weight Bearing Restrictions: No      Mobility  Bed Mobility Overal bed mobility: Needs Assistance Bed Mobility: Supine to Sit;Sit to Supine     Supine to sit: Supervision Sit to supine: Supervision       Transfers Overall transfer level: Needs assistance Equipment used: Rolling walker (2 wheeled) Transfers: Sit to/from [facility] Sit to Stand: Supervision Stand pivot transfers: Supervision       General transfer comment: slightly labored  movement  Ambulation/Gait Ambulation/Gait assistance: Supervision Ambulation Distance (Feet): 45 Feet Assistive device: Rolling walker (2 wheeled) Gait Pattern/deviations: Decreased step length - right;Decreased step length - left;Decreased stride length   Gait velocity interpretation: Below normal speed for age/gender General Gait Details: slightly labored slow cadence without loss of balance, limited secondary to c/o fatigue  Stairs            Wheelchair Mobility    Modified Rankin (Stroke Patients Only)       Balance Overall balance assessment: Needs assistance Sitting-balance support: Feet supported;No upper extremity supported Sitting balance-Leahy Scale: Good     Standing balance support: Bilateral upper extremity supported;During functional activity Standing balance-Leahy Scale: Fair Standing balance comment: using RW                             Pertinent Vitals/Pain Pain Assessment: 0-10 Pain Score: 7  Pain Location: BLE, chronic neuropathy per patient Pain Descriptors / Indicators: Aching;Discomfort Pain Intervention(s): Limited activity within patient's tolerance;Monitored during session    [place] expects to be discharged to:: Private residence Living Arrangements: Other relatives(grandaughter) Available Help at Discharge: Family;Available 24 hours/day Type of Home: House Home Access: Stairs to enter Entrance Stairs-Rails: None Entrance Stairs-Number of Steps: 3 Home Layout: One level Home Equipment: Walker - 4 wheels;Shower seat;Bedside commode;Cane - single point;Walker - 2 wheels  Prior Function Level of Independence: Needs assistance   Gait /  Transfers Assistance Needed: Ambulates household, short distanced community with Rollator  ADL's / Homemaking Assistance Needed: assisted by family        Hand Dominance   Dominant Hand: Right    Extremity/Trunk Assessment   Upper Extremity Assessment Upper Extremity Assessment: Generalized weakness    Lower Extremity Assessment Lower Extremity Assessment: Generalized weakness    Cervical / Trunk Assessment Cervical / Trunk Assessment: Normal  Communication   Communication: No difficulties  Cognition Arousal/Alertness: Awake/alert Behavior During Therapy: WFL for tasks assessed/performed Overall Cognitive Status: Within Functional Limits for tasks assessed                                        General Comments      Exercises     Assessment/Plan    PT Assessment Patient needs continued PT services  PT Problem List Decreased strength;Decreased activity tolerance;Decreased balance       PT Treatment Interventions Gait training;Functional mobility training;Therapeutic activities;Stair training;Therapeutic exercise;Patient/family education    PT Goals (Current goals can be found in the Care Plan section)  Acute Rehab PT Goals Patient Stated Goal: return home with family to assist PT Goal Formulation: With patient Time For Goal Achievement: 04/10/17 Potential to Achieve Goals: Good    Frequency Min 3X/week   Barriers to discharge        Co-evaluation               AM-PAC PT "6 Clicks" Daily Activity  Outcome Measure Difficulty turning over in bed (including adjusting bedclothes, sheets and blankets)?: None Difficulty moving from lying on back to sitting on the side of the bed? : None Difficulty sitting down on and standing up from a chair with arms (e.g., wheelchair, bedside commode, etc,.)?: None Help needed moving to and from a bed to chair (including a wheelchair)?: A Little Help needed walking in hospital room?: A Little Help  needed climbing 3-5 steps with a railing? : A Little 6 Click Score: 21    End of Session Equipment Utilized During Treatment: Gait belt Activity Tolerance: Patient tolerated treatment well;Patient limited by fatigue Patient left: in bed;with call bell/phone within reach;with bed alarm set Nurse Communication: Mobility status PT Visit Diagnosis: Unsteadiness on feet (R26.81);Other abnormalities of gait and mobility (R26.89);Muscle weakness (generalized) (M62.81)    Time: 0301-4996 PT Time Calculation (min) (ACUTE ONLY): 28 min   Charges:   PT Evaluation $PT Eval Moderate Complexity: 1 Mod PT Treatments $Therapeutic Activity: 23-37 mins   PT G Codes:        3:30 PM, 05-03-2017 Lonell Grandchild, MPT Physical Therapist with [facility] [phone] office [phone] mobile phone

## 2017-04-03 NOTE — Progress Notes (Signed)
PROGRESS NOTE    [name] [name]  ZMO:[id number] DOB: 10-05-1945 DOA: 03/31/2017 PCP: Glenda Chroman, MD     Brief Narrative:  72 y/o female who came to the hospital on 3/23 with a 3-4-day history of progressive SOB and abdominal edema.  Patient has Chronic Kidney Disease stage 3, with a creatinine at 2.33. Patient has a nasal canula on 2L O2 with a SpO2 98%.    Assessment & Plan:   Principal Problem:   Acute on chronic diastolic CHF (congestive heart failure) (HCC) Active Problems:   Diabetes mellitus type II, uncontrolled (HCC)   PVD (peripheral vascular disease) (HCC)   CKD (chronic kidney disease) stage 3, GFR 30-59 ml/min (HCC)   Acquired hypothyroidism   Class 1 obesity due to excess calories with serious comorbidity and body mass index (BMI) of 31.0 to 31.9 in adult  Acute on chronic hypoxemic respiratory failure -Due to acute on chronic diastolic heart failure. -She is chronically on 2 L of oxygen and is currently at her baseline 2 L. -Volume status is improved, but Cr trending upward (2.3 on 3/26). -Continue torsemide 60 mg BID. -Patient is 4.6 L negative since admission, continue to strive for negative fluid balance. -Continue beta-blocker  -Continue aspirin, statin. -ARB on hold due to rising Cr.  Type 2 diabetes -Good control. CBGs: 89-196 -A1C at 6.2, continue Lantus, SSI and Januvia.  Chronic kidney disease stage III-IV -Baseline creatinine is around 1.5. -Cr has increased to 2.3 today. -DC Cozaar due to increase in Cr.   -DC spironolactone -Will keep on torsemide for now, but continue stopping/decreasing dose in am if renal function continues to worsen.  Hypothyroidism -TSH at 3.5 -Continue home dose of Synthroid.  Hypertension -Currently well controlled, monitor on home medications.  Peripheral artery disease -Status post bilateral carotid endarterectomies. -Continue aspirin and Plavix.    DVT prophylaxis: SCD's Code Status: Full  code Family Communication: Patient only Disposition Plan: Home when medically stable, anticipate 24 hours if renal function improves.  Consultants:   None  Procedures:   None  Antimicrobials:  Anti-infectives (From admission, onward)   None       Subjective: Feels well other than tired, Denies CP or SOB.  Objective: Vitals:   04/03/17 0545 04/03/17 0632 04/03/17 0910 04/03/17 0932  BP: (!) 106/31 (!) 110/50    Pulse: (!) 46     Resp:      Temp: 98.2 F (36.8 C)     TempSrc: Oral     SpO2: 100%  99% 98%  Weight:      Height:        Intake/Output Summary (Last 24 hours) at 04/03/2017 1110 Last data filed at 04/03/2017 0634 Gross per 24 hour  Intake 680 ml  Output 2400 ml  Net -1720 ml   Filed Weights   04/01/17 0429 04/02/17 0456 04/03/17 0500  Weight: 88.2 kg (194 lb 7.1 oz) 90.4 kg (199 lb 4.7 oz) 86.6 kg (191 lb)    Examination:  General exam: Afebrile. Alert, awake, oriented x 3. Wearing a left eye patch but will not let me examine her eye.  Respiratory system: Clear to auscultation. No use of accessory muscles. No wheezing, rhonchi or crackles. Normal respiratory effort.  Cardiovascular system: RRR. S1 and S2 heard. No murmurs, gallops, or rubs. No JVD's Gastrointestinal system:  Abdomen is soft. Non distended. Non tender to palpation. No organomegaly or masses felt. Positive BS x 4 quadrants.  Central nervous system: Alert and oriented. No  focal neurological deficits. CN 2-12 grossly intact.  Extremities: trace bilateral edema. Pulses full throughout.  Skin: No rashes, lesions, or ulcers  Psychiatry: No hallucinations. Judgement and insight appear normal. Mood and affect appropriate.       Data Reviewed: I have personally reviewed following labs and imaging studies  CBC: Recent Labs  Lab 03/31/17 1333  WBC 8.5  NEUTROABS 7.1  HGB 10.1*  HCT 32.6*  MCV 94.5  PLT 165   Basic Metabolic Panel: Recent Labs  Lab 03/31/17 1333 04/01/17 0608  04/02/17 0604 04/03/17 0527  NA 135 135 138 134*  K 4.7 4.4 4.4 4.2  CL 98* 94* 98* 94*  CO2 _0 GLUCOSE 203* 149* 118* 132*  BUN 72* 72* 87* 104*  CREATININE 1.57* 1.59* 1.93* 2.33*  CALCIUM 9.2 8.9 8.9 8.8*   GFR: Estimated Creatinine Clearance: 25.3 mL/min (A) (by C-G formula based on SCr of 2.33 mg/dL (H)). Liver Function Tests: Recent Labs  Lab 03/31/17 1333  AST 21  ALT 16  ALKPHOS 79  BILITOT 0.4  PROT 8.0  ALBUMIN 3.9   No results for input(s): LIPASE, AMYLASE in the last 168 hours. No results for input(s): AMMONIA in the last 168 hours. Coagulation Profile: No results for input(s): INR, PROTIME in the last 168 hours. Cardiac Enzymes: Recent Labs  Lab 03/31/17 1333  TROPONINI <0.03   BNP (last 3 results) No results for input(s): PROBNP in the last 8760 hours. HbA1C: Recent Labs    03/31/17 1333  HGBA1C 6.2*   CBG: Recent Labs  Lab 04/02/17 0742 04/02/17 1111 04/02/17 1638 04/02/17 2032 04/03/17 0820  GLUCAP 119* 195* 101* 196* 159*   Lipid Profile: No results for input(s): CHOL, HDL, LDLCALC, TRIG, CHOLHDL, LDLDIRECT in the last 72 hours. Thyroid Function Tests: Recent Labs    03/31/17 1333  TSH 3.573   Anemia Panel: No results for input(s): VITAMINB12, FOLATE, FERRITIN, TIBC, IRON, RETICCTPCT in the last 72 hours. Urine analysis:    Component Value Date/Time   COLORURINE YELLOW 10/10/2014 [place] 10/10/2014 2345   LABSPEC 1.010 10/10/2014 2345   PHURINE 6.0 10/10/2014 2345   GLUCOSEU NEGATIVE 10/10/2014 2345   HGBUR TRACE (A) 10/10/2014 2345   BILIRUBINUR NEGATIVE 10/10/2014 2345   KETONESUR NEGATIVE 10/10/2014 2345   PROTEINUR 30 (A) 10/10/2014 2345   UROBILINOGEN 0.2 10/10/2014 2345   NITRITE NEGATIVE 10/10/2014 2345   LEUKOCYTESUR NEGATIVE 10/10/2014 2345   Sepsis Labs: _1 (procalcitonin:4,lacticidven:4)  )No results found for this or any previous visit (from the past 240 hour(s)).        Radiology Studies: No results found.      Scheduled Meds: . amLODipine  10 mg Oral Daily  . aspirin EC  81 mg Oral Daily  . atorvastatin  40 mg Oral q1800  . carvedilol  12.5 mg Oral BID WC  . citalopram  20 mg Oral BH-q7a  . clopidogrel  75 mg Oral Daily  . gabapentin  300 mg Oral Daily  . gabapentin  600 mg Oral QHS  . hydrALAZINE  75 mg Oral Q8H  . insulin aspart  0-15 Units Subcutaneous TID WC  . insulin aspart  0-5 Units Subcutaneous QHS  . insulin aspart  4 Units Subcutaneous TID WC  . insulin detemir  10 Units Subcutaneous Q breakfast  . isosorbide mononitrate  30 mg Oral Daily  . levothyroxine  75 mcg Oral QAC breakfast  . linagliptin  5 mg Oral Daily  .  mouth rinse  15 mL Mouth Rinse BID  . multivitamin with minerals  1 tablet Oral Daily  . potassium chloride SA  20 mEq Oral BID  . sodium chloride flush  3 mL Intravenous Q12H  . torsemide  60 mg Oral BID   Continuous Infusions: . sodium chloride       LOS: 3 days    Time spent: 25 minutes. Greater than 50% of this time was spent in direct contact with the patient coordinating care.     Lelon Frohlich, MD [facility] Pager [phone]  If 7PM-7AM, please contact night-coverage [email or web address] Password [email or web address] 04/03/2017, 11:10 AM

## 2017-04-04 ENCOUNTER — Encounter (HOSPITAL_COMMUNITY): Payer: Self-pay | Admitting: Family Medicine

## 2017-04-04 DIAGNOSIS — E6609 Other obesity due to excess calories: Secondary | ICD-10-CM

## 2017-04-04 DIAGNOSIS — L8990 Pressure ulcer of unspecified site, unspecified stage: Secondary | ICD-10-CM

## 2017-04-04 DIAGNOSIS — I5033 Acute on chronic diastolic (congestive) heart failure: Secondary | ICD-10-CM

## 2017-04-04 DIAGNOSIS — N183 Chronic kidney disease, stage 3 (moderate): Secondary | ICD-10-CM

## 2017-04-04 DIAGNOSIS — E039 Hypothyroidism, unspecified: Secondary | ICD-10-CM

## 2017-04-04 DIAGNOSIS — E11649 Type 2 diabetes mellitus with hypoglycemia without coma: Secondary | ICD-10-CM

## 2017-04-04 DIAGNOSIS — Z6831 Body mass index (BMI) 31.0-31.9, adult: Secondary | ICD-10-CM

## 2017-04-04 LAB — BASIC METABOLIC PANEL
ANION GAP: 13 (ref 5–15)
BUN: 104 mg/dL — ABNORMAL HIGH (ref 6–20)
CALCIUM: 8.7 mg/dL — AB (ref 8.9–10.3)
CHLORIDE: 94 mmol/L — AB (ref 101–111)
CO2: 29 mmol/L (ref 22–32)
Creatinine, Ser: 2.15 mg/dL — ABNORMAL HIGH (ref 0.44–1.00)
GFR calc non Af Amer: 22 mL/min — ABNORMAL LOW (ref >60)
GFR, EST AFRICAN AMERICAN: 26 mL/min — AB (ref >60)
Glucose, Bld: 129 mg/dL — ABNORMAL HIGH (ref 65–99)
Potassium: 3.8 mmol/L (ref 3.5–5.1)
Sodium: 136 mmol/L (ref 135–145)

## 2017-04-04 LAB — GLUCOSE, CAPILLARY
GLUCOSE-CAPILLARY: 181 mg/dL — AB (ref 65–99)
Glucose-Capillary: 128 mg/dL — ABNORMAL HIGH (ref 65–99)

## 2017-04-04 MED ORDER — LEVOTHYROXINE SODIUM 75 MCG PO TABS: 75.0000 ug | ORAL_TABLET | Freq: Every day | ORAL | Status: AC

## 2017-04-04 MED ORDER — TORSEMIDE 20 MG PO TABS: 60.0000 mg | ORAL_TABLET | Freq: Two times a day (BID) | ORAL | 3 refills | Status: DC

## 2017-04-04 NOTE — Progress Notes (Signed)
Night shift floor coverage note.  The staff reported that the patient had another self-limited nosebleed.  She had one 2 nights ago and oxymetazoline nasal spray as needed was ordered.  However, it did not had to be used on this most recent epistaxis episode.  Tennis Must, MD.

## 2017-04-04 NOTE — Care Management Note (Signed)
Case Management Note  Patient Details  Name: [name] [name] MRN: [id number] Date of Birth: June 11, 1945  Subjective/Objective:     Admitted with CHF. Pt is from home, she lives with her granddaughter. She has daughter who helps and takes her to appointments. She has walker and shower stool. She has home oxygen. She has used HH in the past with [facility].                Action/Plan: DC home today with [facility] referral through [facility], per pt choice. Pt aware HH has 48 hrs to make first visit. Blake Divine, [facility] rep, is aware of referral and will pull pt info from chart.   Expected Discharge Date:  04/04/17               Expected Discharge Plan:  [place]  In-House Referral:  NA  Discharge planning Services  CM Consult  Post Acute Care Choice:  Home Health Choice offered to:  Patient  HH Arranged:  PT [place]:  [place]  Status of Service:  Completed, signed off  Sherald Barge, RN 04/04/2017, 9:44 AM

## 2017-04-04 NOTE — Discharge Instructions (Signed)
Follow with Primary MD  Glenda Chroman, MD  and other consultant's as instructed your Hospitalist MD  Please get a complete blood count and chemistry panel checked by your Primary MD at your next visit, and again as instructed by your Primary MD.  Get Medicines reviewed and adjusted: Please take all your medications with you for your next visit with your Primary MD  Laboratory/radiological data: Please request your Primary MD to go over all hospital tests and procedure/radiological results at the follow up, please ask your Primary MD to get all Hospital records sent to his/her office.  In some cases, they will be blood work, cultures and biopsy results pending at the time of your discharge. Please request that your primary care M.D. follows up on these results.  Also Note the following: If you experience worsening of your admission symptoms, develop shortness of breath, life threatening emergency, suicidal or homicidal thoughts you must seek medical attention immediately by calling 911 or calling your MD immediately  if symptoms less severe.  You must read complete instructions/literature along with all the possible adverse reactions/side effects for all the Medicines you take and that have been prescribed to you. Take any new Medicines after you have completely understood and accpet all the possible adverse reactions/side effects.   Do not drive when taking Pain medications or sleeping medications (Benzodaizepines)  Do not take more than prescribed Pain, Sleep and Anxiety Medications. It is not advisable to combine anxiety,sleep and pain medications without talking with your primary care practitioner  Special Instructions: If you have smoked or chewed Tobacco  in the last 2 yrs please stop smoking, stop any regular Alcohol  and or any Recreational drug use.  Wear Seat belts while driving.  Please note: You were cared for by a hospitalist during your hospital stay. Once you are discharged,  your primary care physician will handle any further medical issues. Please note that NO REFILLS for any discharge medications will be authorized once you are discharged, as it is imperative that you return to your primary care physician (or establish a relationship with a primary care physician if you do not have one) for your post hospital discharge needs so that they can reassess your need for medications and monitor your lab values.      Pulmonary Edema Pulmonary edema is abnormal fluid buildup in the lungs that can make it hard to breathe. Follow these instructions at home:  Talk to your doctor about an exercise program.  Eat a healthy diet: ? Eat fresh fruits, vegetables, and lean meats. ? Limit high fat and salty foods. ? Avoid processed, canned, or fried foods. ? Avoid fast food.  Follow your doctor's advice about taking medicine and recording the medicine you take.  Follow your doctor's advice about keeping a record of your weight.  Talk to your doctor about keeping track of your blood pressure.  Do not smoke.  Do not use nicotine patches or nicotine gum.  Make a follow-up appointment with your doctor.  Ask your doctor for a copy of your latest heart tracing (ECG) and keep a copy with you at all times. Get help right away if:  You have chest pain. THIS IS AN EMERGENCY. Do not wait to see if the pain will go away. Call for local emergency medical help. Do not drive yourself to the hospital.  You have sweating, feel sick to your stomach (nauseous), or are experiencing shortness of breath.  Your weight increases more than your  doctor tells you it should.  You start to have shortness of breath.  You notice more swelling in your hands, feet, ankles, or belly.  You have dizziness, blurred vision, headache, or unsteadiness that does not go away.  You cough up bloody spit.  You have a cough that does not go away.  You are unable to sleep because it is hard to  breathe.  You begin to feel a jumping or fluttering sensation (palpitations) in the chest that is unusual for you. This information is not intended to replace advice given to you by your health care provider. Make sure you discuss any questions you have with your health care provider. Document Released: 12/14/2008 Document Revised: 06/03/2015 Document Reviewed: 09/02/2012 Elsevier Interactive Patient Education  2018 [place].  Heart Failure Eating Plan Heart failure, also called congestive heart failure, occurs when your heart does not pump blood well enough to meet your body's needs for oxygen-rich blood. Heart failure is a long-term (chronic) condition. Living with heart failure can be challenging. However, following your health care provider's instructions about a healthy lifestyle and working with a diet and nutrition specialist (dietitian) to choose the right foods may help to improve your symptoms. What are tips for following this plan? General guidelines  Do not eat more than 2,300 mg of salt (sodium) a day. The amount of sodium that is recommended for you may be lower, depending on your condition.  Maintain a healthy body weight as directed. Ask your health care provider what a healthy weight is for you. ? Check your weight every day. ? Work with your health care provider and dietitian to make a plan that is right for you to lose weight or maintain your current weight.  Limit how much fluid you drink. Ask your health care provider or dietitian how much fluid you can have each day.  Limit or avoid alcohol as told by your health care provider or dietitian. Reading food labels  Check food labels for the amount of sodium per serving. Choose foods that have less than 140 mg (milligrams) of sodium in each serving.  Check food labels for the number of calories per serving. This is important if you need to limit your daily calorie intake to lose weight.  Check food labels for the  serving size. If you eat more than one serving, you will be eating more sodium and calories than what is listed on the label.  Look for foods that are labeled as "sodium-free," "very low sodium," or "low sodium." ? Foods labeled as "reduced sodium" or "lightly salted" may still have more sodium than what is recommended for you. Cooking  Avoid adding salt when cooking. Ask your health care provider or dietitian before using salt substitutes.  Season food with salt-free seasonings, spices, or herbs. Check the label of seasoning mixes to make sure they do not contain salt.  Cook with heart-healthy oils, such as olive, canola, soybean, or sunflower oil.  Do not fry foods. Cook foods using low-fat methods, such as baking, boiling, grilling, and broiling.  Limit unhealthy fats when cooking by: ? Removing the skin from poultry, such as chicken. ? Removing all visible fats from meats. ? Skimming the fat off from stews, soups, and gravies before serving them. Meal planning  Limit your intake of: ? Processed, canned, or pre-packaged foods. ? Foods that are high in trans fat, such as fried foods. ? Sweets, desserts, sugary drinks, and other foods with added sugar. ? Full-fat dairy  products, such as whole milk.  Eat a balanced diet that includes: ? 4-5 servings of fruit each day and 4-5 servings of vegetables each day. At each meal, try to fill half of your plate with fruits and vegetables. ? Up to 6-8 servings of whole grains each day. ? Up to 2 servings of lean meat, poultry, or fish each day. One serving of meat is equal to 3 oz. This is about the same size as a deck of cards. ? 2 servings of low-fat dairy each day. ? Heart-healthy fats. Healthy fats called omega-3 fatty acids are found in foods such as flaxseed and cold-water fish like sardines, salmon, and mackerel.  Aim to eat 25-35 g (grams) of fiber a day. Foods that are high in fiber include apples, broccoli, carrots, beans, peas, and  whole grains.  Do not add salt or condiments that contain salt (such as soy sauce) to foods before eating.  When eating at a restaurant, ask that your food be prepared with less salt or no salt, if possible.  Try to eat 2 or more vegetarian meals each week.  Eat more home-cooked food and eat less restaurant, buffet, and fast food. Recommended foods The items listed may not be a complete list. Talk with your dietitian about what dietary choices are best for you. Grains Bread with less than 80 mg of sodium per slice. Whole-wheat pasta, quinoa, and brown rice. Oats and oatmeal. Barley. [place]. Grits and cream of wheat. Whole-grain and whole-wheat cold cereal. Vegetables All fresh vegetables. Vegetables that are frozen without sauce or added salt. Low-sodium or sodium-free canned vegetables. Fruits All fresh, frozen, and canned fruits. Dried fruits, such as raisins, prunes, and cranberries. Meats and other protein foods Lean cuts of meat. Skinless chicken and [place]. Fish with high omega-3 fatty acids, such as salmon, sardines, and other cold-water fishes. Eggs. Dried beans, peas, and edamame. Unsalted nuts and nut butters. Dairy Low-fat or nonfat (skim) milk and dried milk. Rice milk, soy milk, and almond milk. Low-fat or nonfat yogurt. Small amounts of reduced-sodium block cheese. Low-sodium cottage cheese. Fats and oils Olive, canola, soybean, flaxseed, or sunflower oil. Avocado. Sweets and desserts Apple sauce. Granola bars. Sugar-free pudding and gelatin. Frozen fruit bars. Seasoning and other foods Fresh and dried herbs. Lemon or lime juice. Vinegar. Low-sodium ketchup. Salt-free marinades, salad dressings, sauces, and seasonings. Foods to avoid The items listed may not be a complete list. Talk with your dietitian about what dietary choices are best for you. Grains Bread with more than 80 mg of sodium per slice. Hot or cold cereal with more than 140 mg sodium per serving. Salted  pretzels and crackers. Pre-packaged breadcrumbs. Bagels, croissants, and biscuits. Vegetables Canned vegetables. Frozen vegetables with sauce or seasonings. Creamed vegetables. [place] fries. Onion rings. Pickled vegetables and sauerkraut. Fruits Fruits that are dried with sodium-containing preservatives. Meats and other protein foods Ribs and chicken wings. Bacon, ham, pepperoni, bologna, salami, and packaged luncheon meats. Hot dogs, bratwurst, and sausage. Canned meat. Smoked meat and fish. Salted nuts and seeds. Dairy Whole milk, half-and-half, and cream. Buttermilk. Processed cheese, cheese spreads, and cheese curds. Regular cottage cheese. Feta cheese. Shredded cheese. String cheese. Fats and oils Butter, lard, shortening, ghee, and bacon fat. Canned and packaged gravies. Seasoning and other foods Onion salt, garlic salt, table salt, and sea salt. Marinades. Regular salad dressings. Relishes, pickles, and olives. Meat flavorings and tenderizers, and bouillon cubes. Horseradish, ketchup, and mustard. Worcestershire sauce. Teriyaki sauce, soy sauce (including  reduced sodium). Hot sauce and Tabasco sauce. Steak sauce, fish sauce, oyster sauce, and cocktail sauce. Taco seasonings. Barbecue sauce. Tartar sauce. Summary  A heart failure eating plan includes changes that limit your intake of sodium and unhealthy fat, and it may help you lose weight or maintain a healthy weight. Your health care provider may also recommend limiting how much fluid you drink.  Most people with heart failure should eat no more than 2,300 mg of salt (sodium) a day. The amount of sodium that is recommended for you may be lower, depending on your condition.  Contact your health care provider or dietitian before making any major changes to your diet. This information is not intended to replace advice given to you by your health care provider. Make sure you discuss any questions you have with your health care  provider. Document Released: 05/12/2016 Document Revised: 05/12/2016 Document Reviewed: 05/12/2016 Elsevier Interactive Patient Education  2018 [place].  Heart Failure Action Plan A heart failure action plan helps you understand what to do when you have symptoms of heart failure. Follow the plan that was created by you and your health care provider. Review your plan each time you visit your health care provider. Red zone These signs and symptoms mean you should get medical help right away:  You have trouble breathing when resting.  You have a dry cough that is getting worse.  You have swelling or pain in your legs or abdomen that is getting worse.  You suddenly gain more than 2-3 lb (0.9-1.4 kg) in a day, or more than 5 lb (2.3 kg) in one week. This amount may be more or less depending on your condition.  You have trouble staying awake or you feel confused.  You have chest pain.  You do not have an appetite.  You pass out.  If you experience any of these symptoms:  Call your local emergency services (911 in the [place] right away or seek help at the emergency department of the nearest hospital.  Yellow zone These signs and symptoms mean your condition may be getting worse and you should make some changes:  You have trouble breathing when you are active or you need to sleep with extra pillows.  You have swelling in your legs or abdomen.  You gain 2-3 lb (0.9-1.4 kg) in one day, or 5 lb (2.3 kg) in one week. This amount may be more or less depending on your condition.  You get tired easily.  You have trouble sleeping.  You have a dry cough.  If you experience any of these symptoms:  Contact your health care provider within the next day.  Your health care provider may adjust your medicines.  Green zone These signs mean you are doing well and can continue what you are doing:  You do not have shortness of breath.  You have very little swelling or no new  swelling.  Your weight is stable (no gain or loss).  You have a normal activity level.  You do not have chest pain or any other new symptoms.  Follow these instructions at home:  Take over-the-counter and prescription medicines only as told by your health care provider.  Weigh yourself daily. Your target weight is __________ lb (__________ kg). ? Call your health care provider if you gain more than __________ lb (__________ kg) in a day, or more than __________ lb (__________ kg) in one week.  Eat a heart-healthy diet. Work with a diet and nutrition specialist (  dietitian) to create an eating plan that is best for you.  Keep all follow-up visits as told by your health care provider. This is important. Where to find more information:  American Heart Association: www.heart.org Summary  Follow the action plan that was created by you and your health care provider.  Get help right away if you have any symptoms in the Red zone. This information is not intended to replace advice given to you by your health care provider. Make sure you discuss any questions you have with your health care provider. Document Released: 02/05/2016 Document Revised: 02/05/2016 Document Reviewed: 02/05/2016 Elsevier Interactive Patient Education  2018 [place].  Heart Failure Heart failure means your heart has trouble pumping blood. This makes it hard for your body to work well. Heart failure is usually a long-term (chronic) condition. You must take good care of yourself and follow your doctor's treatment plan. Follow these instructions at home:  Take your heart medicine as told by your doctor. ? Do not stop taking medicine unless your doctor tells you to. ? Do not skip any dose of medicine. ? Refill your medicines before they run out. ? Take other medicines only as told by your doctor or pharmacist.  Stay active if told by your doctor. The elderly and people with severe heart failure should talk with a  doctor about physical activity.  Eat heart-healthy foods. Choose foods that are without trans fat and are low in saturated fat, cholesterol, and salt (sodium). This includes fresh or frozen fruits and vegetables, fish, lean meats, fat-free or low-fat dairy foods, whole grains, and high-fiber foods. Lentils and dried peas and beans (legumes) are also good choices.  Limit salt if told by your doctor.  Cook in a healthy way. Roast, grill, broil, bake, poach, steam, or stir-fry foods.  Limit fluids as told by your doctor.  Weigh yourself every morning. Do this after you pee (urinate) and before you eat breakfast. Write down your weight to give to your doctor.  Take your blood pressure and write it down if your doctor tells you to.  Ask your doctor how to check your pulse. Check your pulse as told.  Lose weight if told by your doctor.  Stop smoking or chewing tobacco. Do not use gum or patches that help you quit without your doctor's approval.  Schedule and go to doctor visits as told.  Nonpregnant women should have no more than 1 drink a day. Men should have no more than 2 drinks a day. Talk to your doctor about drinking alcohol.  Stop illegal drug use.  Stay current with shots (immunizations).  Manage your health conditions as told by your doctor.  Learn to manage your stress.  Rest when you are tired.  If it is really hot outside: ? Avoid intense activities. ? Use air conditioning or fans, or get in a cooler place. ? Avoid caffeine and alcohol. ? Wear loose-fitting, lightweight, and light-colored clothing.  If it is really cold outside: ? Avoid intense activities. ? Layer your clothing. ? Wear mittens or gloves, a hat, and a scarf when going outside. ? Avoid alcohol.  Learn about heart failure and get support as needed.  Get help to maintain or improve your quality of life and your ability to care for yourself as needed. Contact a doctor if:  You gain weight  quickly.  You are more short of breath than usual.  You cannot do your normal activities.  You tire easily.  You  cough more than normal, especially with activity.  You have any or more puffiness (swelling) in areas such as your hands, feet, ankles, or belly (abdomen).  You cannot sleep because it is hard to breathe.  You feel like your heart is beating fast (palpitations).  You get dizzy or light-headed when you stand up. Get help right away if:  You have trouble breathing.  There is a change in mental status, such as becoming less alert or not being able to focus.  You have chest pain or discomfort.  You faint. This information is not intended to replace advice given to you by your health care provider. Make sure you discuss any questions you have with your health care provider. Document Released: 10/05/2007 Document Revised: 06/03/2015 Document Reviewed: 02/12/2012 Elsevier Interactive Patient Education  2017 [employer].

## 2017-04-04 NOTE — Discharge Summary (Signed)
Physician Discharge Summary  Tanae Petrosky Fenn TIR:[id number] DOB: 29-Sep-1945 DOA: 03/31/2017  PCP: Glenda Chroman, MD Cardiologist: Domenic Polite Advanced Heart: Jonnie Kind date: 03/31/2017 Discharge date: 04/04/2017  Admitted From: Home  Disposition:  Home with Home Health   Recommendations for Outpatient Follow-up:  1. Follow up with PCP and cardiologists in 1-2 weeks 2. Please obtain BMP/CBC in one week  Home Health: Physical Therapy  Discharge Condition: STABLE   CODE STATUS: FULL    Brief Hospitalization Summary: Please see all hospital notes, images, labs for full details of the hospitalization.  HPI: [name] [name] is a 72 y.o. female with history of chronic diastolic heart failure, peripheral artery disease with carotid artery disease and bilateral carotid endarterectomies, depression, hypertension, hypothyroidism among other issues who presents to the hospital today with a 3-4-day history of progressive shortness of breath and abdominal edema.  Patient states she has had to increase her home oxygen from 2-5.  Her daughter became concerned when she saw her today and noticed her significant increased abdominal girth and urged her to come to the hospital for evaluation.  In the ED her vital signs are stable, labs are significant for a creatinine of 1.57 which is her baseline, BNP of 475, hemoglobin of 10 which is her baseline.  Chest x-ray with pulmonary edema.  Admission is requested for treatment of acute exacerbation of heart failure.  Brief Narrative:  72 y/o female who came to the hospital on 3/23 with a 3-4-day history of progressive SOB and abdominal edema.  Patient has Chronic Kidney Disease stage 3, with a creatinine at 2.33. Patient has a nasal canula on 2L O2 with a SpO2 98%.   Assessment & Plan:     Acute on chronic diastolic CHF (congestive heart failure) (HCC) Active Problems:   Diabetes mellitus type II, uncontrolled (HCC)   PVD (peripheral vascular disease) (HCC)   CKD  (chronic kidney disease) stage 3, GFR 30-59 ml/min (HCC)   Acquired hypothyroidism   Class 1 obesity due to excess calories with serious comorbidity and body mass index (BMI) of 31.0 to 31.9 in adult  Acute on chronic hypoxemic respiratory failure -Due to acute on chronic diastolic heart failure. -She is chronically on 2 L of oxygen and is currently at her baseline 2 L. -Volume status is improved, but Cr trending upward (2.3 on 3/26).  Now trending back down.   -Continue torsemide 60 mg BID. -Patient is 4.6 L negative since admission, continue to strive for negative fluid balance. -Continue beta-blocker  -Continue aspirin, statin. -ARB on hold due to rising Cr.  Follow up outpatient to discuss resuming.    Type 2 diabetes -Good control. CBGs: 89-196 -A1C at 6.2, continue Lantus, SSI and Januvia.  CBG (last 3)  Recent Labs    04/03/17 1821 04/03/17 2153 04/04/17 0807  GLUCAP 223* 156* 128*   Chronic kidney disease stage III-IV -Baseline creatinine is around 1.5. -Cr has increased to 2.3 today. -DC Cozaar due to increase in Cr.  follow up outpatient to discuss resuming with PCP/cardiologist -DC spironolactone -Will keep on torsemide 60 mg BID as renal function improving, follow up with PCP/cardiologists outpatient and recheck BMP in 1 week on outpatient follow up recommended.   Hypothyroidism -TSH at 3.5 -Continue home dose of Synthroid.  Hypertension -Currently well controlled, monitor on home medications.  Peripheral artery disease -Status post bilateral carotid endarterectomies. -Continue aspirin and Plavix.   DVT prophylaxis: SCDs Code Status: Full code Family Communication: Patient only Disposition  Plan: Home with Home health  Discharge Diagnoses:  Principal Problem:   Acute on chronic diastolic CHF (congestive heart failure) (HCC) Active Problems:   Diabetes mellitus type II, uncontrolled (HCC)   PVD (peripheral vascular disease) (HCC)   CKD  (chronic kidney disease) stage 3, GFR 30-59 ml/min (HCC)   Acquired hypothyroidism   Class 1 obesity due to excess calories with serious comorbidity and body mass index (BMI) of 31.0 to 31.9 in adult   Pressure injury of skin  Discharge Instructions: Discharge Instructions    (HEART FAILURE PATIENTS) Call MD:  Anytime you have any of the following symptoms: 1) 3 pound weight gain in 24 hours or 5 pounds in 1 week 2) shortness of breath, with or without a dry hacking cough 3) swelling in the hands, feet or stomach 4) if you have to sleep on extra pillows at night in order to breathe.   Complete by:  As directed    Call MD for:  difficulty breathing, headache or visual disturbances   Complete by:  As directed    Call MD for:  extreme fatigue   Complete by:  As directed    Call MD for:  persistant nausea and vomiting   Complete by:  As directed    Call MD for:  severe uncontrolled pain   Complete by:  As directed    Diet - low sodium heart healthy   Complete by:  As directed    Increase activity slowly   Complete by:  As directed      Allergies as of 04/04/2017      Reactions   Other Shortness Of Breath, Rash, Other (See Comments)   CANNOT EAT ANY BERRIES!!   Strawberry Extract Hives, Shortness Of Breath, Rash   Sulfa Antibiotics Swelling, Other (See Comments)   Bodily Swelling   Coffee Bean Extract [coffea Arabica] Itching, Rash   Tape Other (See Comments)   Tears skin.  Please use "paper" tape only.      Medication List    STOP taking these medications   losartan 25 MG tablet Commonly known as:  COZAAR   spironolactone 25 MG tablet Commonly known as:  ALDACTONE     TAKE these medications   acetaminophen 500 MG tablet Commonly known as:  TYLENOL Take 500-1,000 mg by mouth every 6 (six) hours as needed for mild pain or moderate pain.   albuterol 108 (90 Base) MCG/ACT inhaler Commonly known as:  PROVENTIL HFA;VENTOLIN HFA Inhale 2 puffs into the lungs daily as needed  for wheezing or shortness of breath.   amLODipine 10 MG tablet Commonly known as:  NORVASC Take 1 tablet (10 mg total) by mouth daily.   ANORO ELLIPTA 62.5-25 MCG/INH Aepb Generic drug:  umeclidinium-vilanterol Inhale 1 puff into the lungs daily.   aspirin 81 MG EC tablet Take 81 mg by mouth daily.   atorvastatin 40 MG tablet Commonly known as:  LIPITOR Take 1 tablet (40 mg total) by mouth daily at 6 PM.   carvedilol 12.5 MG tablet Commonly known as:  COREG Take by mouth.   citalopram 40 MG tablet Commonly known as:  CELEXA Take 20 mg by mouth every morning.   clopidogrel 75 MG tablet Commonly known as:  PLAVIX TAKE ONE TABLET BY MOUTH ONCE DAILY WITH  BREAKFAST   clotrimazole-betamethasone cream Commonly known as:  LOTRISONE Apply 1 application topically 2 (two) times daily as needed.   gabapentin 300 MG capsule Commonly known as:  NEURONTIN Take 300-600  mg by mouth See admin instructions. 300 mg in the morning and 600 mg prior to bed(time)   hydrALAZINE 25 MG tablet Commonly known as:  APRESOLINE Take 3 tablets (75 mg total) by mouth every 8 (eight) hours.   isosorbide mononitrate 30 MG 24 hr tablet Commonly known as:  IMDUR Take 1 tablet (30 mg total) by mouth daily.   JANUVIA 25 MG tablet Generic drug:  sitaGLIPtin TAKE 1 TABLET BY MOUTH ONCE DAILY   LEVEMIR 100 UNIT/ML injection Generic drug:  insulin detemir Inject 10 Units into the skin daily with breakfast.   levothyroxine 75 MCG tablet Commonly known as:  SYNTHROID, LEVOTHROID Take 1 tablet (75 mcg total) by mouth daily before breakfast. What changed:    medication strength  how much to take   multivitamin with minerals Tabs tablet Take 1 tablet by mouth daily.   nitroGLYCERIN 0.4 MG SL tablet Commonly known as:  NITROSTAT Place 1 tablet (0.4 mg total) under the tongue every 5 (five) minutes as needed for chest pain. Up to 3 doses. If no relief after 3rd dose, proceed to the ED for an  evaluation   nystatin powder Commonly known as:  MYCOSTATIN/NYSTOP Apply 1 application topically 2 (two) times daily as needed.   potassium chloride SA 20 MEQ tablet Commonly known as:  K-DUR,KLOR-CON Take 1 tablet (20 mEq total) by mouth 2 (two) times daily.   STOOL SOFTENER PO Take 200 mg by mouth at bedtime as needed (for constipation).   tiZANidine 4 MG tablet Commonly known as:  ZANAFLEX Take 4 mg by mouth 2 (two) times daily as needed for muscle spasms.   torsemide 20 MG tablet Commonly known as:  DEMADEX Take 3 tablets (60 mg total) by mouth 2 (two) times daily. What changed:  See the new instructions.   traMADol 50 MG tablet Commonly known as:  ULTRAM Take 50 mg by mouth.      Follow-up Information    Satira Sark, MD. Schedule an appointment as soon as possible for a visit in 1 week(s).   Specialty:  Cardiology Why:  Hospital Follow Up  Contact information: [place] [address] [phone]        Larey Dresser, MD. Schedule an appointment as soon as possible for a visit in 2 week(s).   Specialty:  Cardiology Why:  Hospital Follow Up  Contact information: [address] [place] [address] [phone]        Glenda Chroman, MD. Schedule an appointment as soon as possible for a visit in 1 week(s).   Specialty:  Internal Medicine Contact information: [place] [phone]          Allergies  Allergen Reactions  . Other Shortness Of Breath, Rash and Other (See Comments)    CANNOT EAT ANY BERRIES!!  . Strawberry Extract Hives, Shortness Of Breath and Rash  . Sulfa Antibiotics Swelling and Other (See Comments)    Bodily Swelling  . Coffee Bean Extract [Coffea Arabica] Itching and Rash  . Tape Other (See Comments)    Tears skin.  Please use "paper" tape only.   Allergies as of 04/04/2017      Reactions   Other Shortness Of Breath, Rash, Other (See Comments)   CANNOT EAT ANY  BERRIES!!   Strawberry Extract Hives, Shortness Of Breath, Rash   Sulfa Antibiotics Swelling, Other (See Comments)   Bodily Swelling   Coffee Bean Extract [coffea  Arabica] Itching, Rash   Tape Other (See Comments)   Tears skin.  Please use "paper" tape only.      Medication List    STOP taking these medications   losartan 25 MG tablet Commonly known as:  COZAAR   spironolactone 25 MG tablet Commonly known as:  ALDACTONE     TAKE these medications   acetaminophen 500 MG tablet Commonly known as:  TYLENOL Take 500-1,000 mg by mouth every 6 (six) hours as needed for mild pain or moderate pain.   albuterol 108 (90 Base) MCG/ACT inhaler Commonly known as:  PROVENTIL HFA;VENTOLIN HFA Inhale 2 puffs into the lungs daily as needed for wheezing or shortness of breath.   amLODipine 10 MG tablet Commonly known as:  NORVASC Take 1 tablet (10 mg total) by mouth daily.   ANORO ELLIPTA 62.5-25 MCG/INH Aepb Generic drug:  umeclidinium-vilanterol Inhale 1 puff into the lungs daily.   aspirin 81 MG EC tablet Take 81 mg by mouth daily.   atorvastatin 40 MG tablet Commonly known as:  LIPITOR Take 1 tablet (40 mg total) by mouth daily at 6 PM.   carvedilol 12.5 MG tablet Commonly known as:  COREG Take by mouth.   citalopram 40 MG tablet Commonly known as:  CELEXA Take 20 mg by mouth every morning.   clopidogrel 75 MG tablet Commonly known as:  PLAVIX TAKE ONE TABLET BY MOUTH ONCE DAILY WITH  BREAKFAST   clotrimazole-betamethasone cream Commonly known as:  LOTRISONE Apply 1 application topically 2 (two) times daily as needed.   gabapentin 300 MG capsule Commonly known as:  NEURONTIN Take 300-600 mg by mouth See admin instructions. 300 mg in the morning and 600 mg prior to bed(time)   hydrALAZINE 25 MG tablet Commonly known as:  APRESOLINE Take 3 tablets (75 mg total) by mouth every 8 (eight) hours.   isosorbide mononitrate 30 MG 24 hr tablet Commonly known as:   IMDUR Take 1 tablet (30 mg total) by mouth daily.   JANUVIA 25 MG tablet Generic drug:  sitaGLIPtin TAKE 1 TABLET BY MOUTH ONCE DAILY   LEVEMIR 100 UNIT/ML injection Generic drug:  insulin detemir Inject 10 Units into the skin daily with breakfast.   levothyroxine 75 MCG tablet Commonly known as:  SYNTHROID, LEVOTHROID Take 1 tablet (75 mcg total) by mouth daily before breakfast. What changed:    medication strength  how much to take   multivitamin with minerals Tabs tablet Take 1 tablet by mouth daily.   nitroGLYCERIN 0.4 MG SL tablet Commonly known as:  NITROSTAT Place 1 tablet (0.4 mg total) under the tongue every 5 (five) minutes as needed for chest pain. Up to 3 doses. If no relief after 3rd dose, proceed to the ED for an evaluation   nystatin powder Commonly known as:  MYCOSTATIN/NYSTOP Apply 1 application topically 2 (two) times daily as needed.   potassium chloride SA 20 MEQ tablet Commonly known as:  K-DUR,KLOR-CON Take 1 tablet (20 mEq total) by mouth 2 (two) times daily.   STOOL SOFTENER PO Take 200 mg by mouth at bedtime as needed (for constipation).   tiZANidine 4 MG tablet Commonly known as:  ZANAFLEX Take 4 mg by mouth 2 (two) times daily as needed for muscle spasms.   torsemide 20 MG tablet Commonly known as:  DEMADEX Take 3 tablets (60 mg total) by mouth 2 (two) times daily. What changed:  See the new instructions.   traMADol 50 MG tablet Commonly known as:  ULTRAM Take 50 mg by mouth.       Procedures/Studies: Dg Chest 2 View  Result Date: 03/31/2017 CLINICAL DATA:  Shortness of breath and weight gain. EXAM: CHEST - 2 VIEW COMPARISON:  07/27/2016. FINDINGS: Patient is rotated. Trachea is midline. Heart is enlarged. Mixed interstitial and airspace opacification bilaterally. No definite pleural fluid. IMPRESSION: Pulmonary edema. Electronically Signed   By: Lorin Picket M.D.   On: 03/31/2017 14:21      Subjective: Pt was seen sitting up  in bed, she says she feels much better and asking about going home today.  She says she will follow up with her PCP and cardiologist.   Discharge Exam: Vitals:   04/04/17 0639 04/04/17 0818  BP: (!) 114/34   Pulse: (!) 58 (!) 48  Resp:    Temp: 98.4 F (36.9 C)   SpO2: 98%    Vitals:   04/03/17 2010 04/03/17 2107 04/04/17 0639 04/04/17 0818  BP: (!) 127/38  (!) 114/34   Pulse: (!) 58  (!) 58 (!) 48  Resp:      Temp: 98.8 F (37.1 C)  98.4 F (36.9 C)   TempSrc: Oral  Oral   SpO2: 98% 92% 98%   Weight:    89 kg (196 lb 3.4 oz)  Height:       General: Pt is alert, awake, not in acute distress Cardiovascular: RRR, S1/S2 +, no rubs, no gallops Respiratory: CTA bilaterally, no wheezing, no rhonchi Abdominal: Soft, NT, ND, bowel sounds + Extremities: no edema, no cyanosis   The results of significant diagnostics from this hospitalization (including imaging, microbiology, ancillary and laboratory) are listed below for reference.     Microbiology: No results found for this or any previous visit (from the past 240 hour(s)).   Labs: BNP (last 3 results) Recent Labs    07/28/16 1332 03/31/17 1333  BNP 643.2* 453.6*   Basic Metabolic Panel: Recent Labs  Lab 03/31/17 1333 04/01/17 0608 04/02/17 0604 04/03/17 0527 04/04/17 0617  NA 135 135 138 134* 136  K 4.7 4.4 4.4 4.2 3.8  CL 98* 94* 98* 94* 94*  CO2 _0 GLUCOSE 203* 149* 118* 132* 129*  BUN 72* 72* 87* 104* 104*  CREATININE 1.57* 1.59* 1.93* 2.33* 2.15*  CALCIUM 9.2 8.9 8.9 8.8* 8.7*   Liver Function Tests: Recent Labs  Lab 03/31/17 1333  AST 21  ALT 16  ALKPHOS 79  BILITOT 0.4  PROT 8.0  ALBUMIN 3.9   No results for input(s): LIPASE, AMYLASE in the last 168 hours. No results for input(s): AMMONIA in the last 168 hours. CBC: Recent Labs  Lab 03/31/17 1333  WBC 8.5  NEUTROABS 7.1  HGB 10.1*  HCT 32.6*  MCV 94.5  PLT 182   Cardiac Enzymes: Recent Labs  Lab 03/31/17 1333   TROPONINI <0.03   BNP: Invalid input(s): POCBNP CBG: Recent Labs  Lab 04/03/17 0820 04/03/17 1219 04/03/17 1821 04/03/17 2153 04/04/17 0807  GLUCAP 159* 143* 223* 156* 128*   D-Dimer No results for input(s): DDIMER in the last 72 hours. Hgb A1c No results for input(s): HGBA1C in the last 72 hours. Lipid Profile No results for input(s): CHOL, HDL, LDLCALC, TRIG, CHOLHDL, LDLDIRECT in the last 72 hours. Thyroid function studies No results for input(s): TSH, T4TOTAL, T3FREE, THYROIDAB in the last 72 hours.  Invalid input(s): FREET3 Anemia work up No results for input(s): VITAMINB12, FOLATE, FERRITIN, TIBC, IRON, RETICCTPCT in the last 72 hours. Urinalysis  Component Value Date/Time   COLORURINE YELLOW 10/10/2014 2345   APPEARANCEUR CLEAR 10/10/2014 2345   LABSPEC 1.010 10/10/2014 2345   PHURINE 6.0 10/10/2014 2345   GLUCOSEU NEGATIVE 10/10/2014 2345   HGBUR TRACE (A) 10/10/2014 2345   BILIRUBINUR NEGATIVE 10/10/2014 2345   KETONESUR NEGATIVE 10/10/2014 2345   PROTEINUR 30 (A) 10/10/2014 2345   UROBILINOGEN 0.2 10/10/2014 2345   NITRITE NEGATIVE 10/10/2014 2345   LEUKOCYTESUR NEGATIVE 10/10/2014 2345   Sepsis Labs Invalid input(s): PROCALCITONIN,  WBC,  LACTICIDVEN Microbiology No results found for this or any previous visit (from the past 240 hour(s)).  Time coordinating discharge: 37 minutes  SIGNED:  Irwin Brakeman, MD  [facility] 04/04/2017, 9:39 AM Pager [phone]  If 7PM-7AM, please contact night-coverage [email or web address] Password [email or web address]

## 2017-04-12 DIAGNOSIS — Z6833 Body mass index (BMI) 33.0-33.9, adult: Secondary | ICD-10-CM | POA: Diagnosis not present

## 2017-04-12 DIAGNOSIS — Z09 Encounter for follow-up examination after completed treatment for conditions other than malignant neoplasm: Secondary | ICD-10-CM | POA: Diagnosis not present

## 2017-04-12 DIAGNOSIS — I509 Heart failure, unspecified: Secondary | ICD-10-CM | POA: Diagnosis not present

## 2017-04-12 DIAGNOSIS — E1165 Type 2 diabetes mellitus with hyperglycemia: Secondary | ICD-10-CM | POA: Diagnosis not present

## 2017-04-12 DIAGNOSIS — E1142 Type 2 diabetes mellitus with diabetic polyneuropathy: Secondary | ICD-10-CM | POA: Diagnosis not present

## 2017-04-12 DIAGNOSIS — G20 Parkinson's disease: Secondary | ICD-10-CM | POA: Diagnosis not present

## 2017-04-13 DIAGNOSIS — Z8673 Personal history of transient ischemic attack (TIA), and cerebral infarction without residual deficits: Secondary | ICD-10-CM | POA: Diagnosis not present

## 2017-04-13 DIAGNOSIS — Z7902 Long term (current) use of antithrombotics/antiplatelets: Secondary | ICD-10-CM | POA: Diagnosis not present

## 2017-04-13 DIAGNOSIS — Z95828 Presence of other vascular implants and grafts: Secondary | ICD-10-CM | POA: Diagnosis not present

## 2017-04-13 DIAGNOSIS — N183 Chronic kidney disease, stage 3 (moderate): Secondary | ICD-10-CM | POA: Diagnosis not present

## 2017-04-13 DIAGNOSIS — E668 Other obesity: Secondary | ICD-10-CM | POA: Diagnosis not present

## 2017-04-13 DIAGNOSIS — Z87891 Personal history of nicotine dependence: Secondary | ICD-10-CM | POA: Diagnosis not present

## 2017-04-13 DIAGNOSIS — E1122 Type 2 diabetes mellitus with diabetic chronic kidney disease: Secondary | ICD-10-CM | POA: Diagnosis not present

## 2017-04-13 DIAGNOSIS — Z6831 Body mass index (BMI) 31.0-31.9, adult: Secondary | ICD-10-CM | POA: Diagnosis not present

## 2017-04-13 DIAGNOSIS — Z9981 Dependence on supplemental oxygen: Secondary | ICD-10-CM | POA: Diagnosis not present

## 2017-04-13 DIAGNOSIS — Z951 Presence of aortocoronary bypass graft: Secondary | ICD-10-CM | POA: Diagnosis not present

## 2017-04-13 DIAGNOSIS — I872 Venous insufficiency (chronic) (peripheral): Secondary | ICD-10-CM | POA: Diagnosis not present

## 2017-04-13 DIAGNOSIS — Z794 Long term (current) use of insulin: Secondary | ICD-10-CM | POA: Diagnosis not present

## 2017-04-13 DIAGNOSIS — I130 Hypertensive heart and chronic kidney disease with heart failure and stage 1 through stage 4 chronic kidney disease, or unspecified chronic kidney disease: Secondary | ICD-10-CM | POA: Diagnosis not present

## 2017-04-13 DIAGNOSIS — I2510 Atherosclerotic heart disease of native coronary artery without angina pectoris: Secondary | ICD-10-CM | POA: Diagnosis not present

## 2017-04-13 DIAGNOSIS — E1151 Type 2 diabetes mellitus with diabetic peripheral angiopathy without gangrene: Secondary | ICD-10-CM | POA: Diagnosis not present

## 2017-04-13 DIAGNOSIS — E782 Mixed hyperlipidemia: Secondary | ICD-10-CM | POA: Diagnosis not present

## 2017-04-13 DIAGNOSIS — E039 Hypothyroidism, unspecified: Secondary | ICD-10-CM | POA: Diagnosis not present

## 2017-04-13 DIAGNOSIS — J9621 Acute and chronic respiratory failure with hypoxia: Secondary | ICD-10-CM | POA: Diagnosis not present

## 2017-04-13 DIAGNOSIS — I5033 Acute on chronic diastolic (congestive) heart failure: Secondary | ICD-10-CM | POA: Diagnosis not present

## 2017-04-16 DIAGNOSIS — E1151 Type 2 diabetes mellitus with diabetic peripheral angiopathy without gangrene: Secondary | ICD-10-CM | POA: Diagnosis not present

## 2017-04-16 DIAGNOSIS — E1122 Type 2 diabetes mellitus with diabetic chronic kidney disease: Secondary | ICD-10-CM | POA: Diagnosis not present

## 2017-04-16 DIAGNOSIS — J9621 Acute and chronic respiratory failure with hypoxia: Secondary | ICD-10-CM | POA: Diagnosis not present

## 2017-04-16 DIAGNOSIS — I130 Hypertensive heart and chronic kidney disease with heart failure and stage 1 through stage 4 chronic kidney disease, or unspecified chronic kidney disease: Secondary | ICD-10-CM | POA: Diagnosis not present

## 2017-04-16 DIAGNOSIS — I5033 Acute on chronic diastolic (congestive) heart failure: Secondary | ICD-10-CM | POA: Diagnosis not present

## 2017-04-16 DIAGNOSIS — I2510 Atherosclerotic heart disease of native coronary artery without angina pectoris: Secondary | ICD-10-CM | POA: Diagnosis not present

## 2017-04-17 NOTE — Progress Notes (Signed)
Cardiology Office Note    Date:  04/18/2017   ID:  [name] [name], DOB [birth year], MRN [id number]  PCP:  Glenda Chroman, MD  Cardiologist: Rozann Lesches, MD   CHF: Dr. Aundra Dubin  Chief Complaint  Patient presents with  . Hospitalization Follow-up    History of Present Illness:    [name] [name] is a 72 y.o. female with past medical history of CAD (s/p CABG in 2012 with LIMA-LAD, SVG-PL, SVG-OM, and SVG-D1), chronic diastolic CHF, HTN, HLD, and Stage 3 CKD who presents to the office today for hospital follow-up.  She was recently admitted to [facility] from 3/23 to 04/04/2017 for evaluation of worsening dyspnea on exertion and abdominal distention. BNP was found to be elevated at 475 and CXR showed pulmonary edema, therefore she was admitted for further treatment of her CHF exacerbation. She diuresed well with IV Lasix and was overall -4.6 L during admission with a discharge weight of 194 lbs. She was continued on Torsemide 60 mg twice daily but Losartan and Spironolactone were held at the time of discharge due to her acute kidney injury (creatinine peaked at 2.33, trending down to 2.15 on 3/27).  In talking with the patient and her daughter today, she reports having noticed a gradual incline in her weight since returning home. Her baseline weight on her home scales is usually between 185-190 lbs and has trended up from 190.3 at the time of recent hospital discharge to 200 lbs on her home scales today. Weight is 204 lbs during today's office visit. She reports breathing has overall been at baseline and denies any specific orthopnea or PND but she has noticed worsening abdominal distention and lower extremity edema. She uses 2.0-2.5L [place] 24/7. She denies any recent chest pain or palpitations.  She reports good compliance with her medication regimen and denies missing any recent doses of Torsemide. She does consume a low-sodium diet.   Past Medical History:  Diagnosis Date  . Arthritis   .  Carotid artery disease (HCC)    a. Bilateral CEA; 50-60% bilateral ICA stenosis, 11/12  . Cellulitis    a. Recurrent, bilateral legs  . CHF (congestive heart failure) ([place])   . Chronic back pain   . Chronic diastolic heart failure ([place])   . Coronary atherosclerosis of native coronary artery    a. 11/2010 NSTEMI/CABG x 4: L-LAD; S-PL; S-OM; S-DX, by PVT.  [name] Critical lower limb ischemia   . Depression   . Essential hypertension, benign   . Herniated disc   . History of blood transfusion   . Hypothyroidism   . Ischemic cardiomyopathy    a. 11/2010 TEE: EF 40-45%.  . Migraine   . Mixed hyperlipidemia   . NSTEMI (non-ST elevated myocardial infarction) ([place]) 11/2010  . On home oxygen therapy   . PAD (peripheral artery disease) ([place])   . Pleural effusion   . Pneumonia 2000's X 2  . Stroke ([place]) 08/2009  . Suicide attempt ([place]) 08/2002  . TIA (transient ischemic attack)   . TMJ syndrome   . Type 2 diabetes mellitus ([place])   . Venous insufficiency     Past Surgical History:  Procedure Laterality Date  . ABDOMINAL HYSTERECTOMY    . APPENDECTOMY    . CARDIAC CATHETERIZATION  11/2010  . CARDIAC CATHETERIZATION N/A 09/16/2015   Procedure: Right Heart Cath;  Surgeon: Larey Dresser, MD;  Location: [facility];  Service: Cardiovascular;  Laterality: N/A;  . CAROTID ENDARTERECTOMY Bilateral  2011   Bilateral - Dr. Kellie Simmering  . CHOLECYSTECTOMY    . COLONOSCOPY W/ BIOPSIES AND POLYPECTOMY    . CORONARY ANGIOPLASTY WITH STENT PLACEMENT  03/2013   "1"  . CORONARY ARTERY BYPASS GRAFT  11/24/2010   Procedure: CORONARY ARTERY BYPASS GRAFTING (CABG);  Surgeon: Tharon Aquas Adelene Idler, MD;  Location: [facility];  Service: Open Heart Surgery;  Laterality: N/A;  Coronary Artery Bypass Graft times four on pump utilizing left internal mammary artery and bilateral greater saphenous veins harvested endoscopically, transesophageal echocardiogram   . DEBRIDEMENT TOE Left    "nonhealing wound; 3rd digit  .  DILATION AND CURETTAGE OF UTERUS    . ENDARTERECTOMY POPLITEAL Left 10/07/2013   Procedure: LEFT POPLITEAL ENDARTERECTOMY ;  Surgeon: Angelia Mould, MD;  Location: [facility];  Service: Vascular;  Laterality: Left;  . FEMORAL-POPLITEAL BYPASS GRAFT Left 10/07/2013   Procedure: LEFT FEMORAL-POPLITEAL ARTERY BYPASS GRAFT;  Surgeon: Angelia Mould, MD;  Location: [facility];  Service: Vascular;  Laterality: Left;  . FRACTURE SURGERY    . INTRAOPERATIVE ARTERIOGRAM Left 10/07/2013   Procedure: INTRA OPERATIVE ARTERIOGRAM LEFT LEG;  Surgeon: Angelia Mould, MD;  Location: [facility];  Service: Vascular;  Laterality: Left;  . IR GENERIC HISTORICAL  09/21/2015   IR REMOVAL OF PLURAL CATH W/CUFF 09/21/2015 Marybelle Killings, MD [facility]-INTERV RAD  . LEFT AND RIGHT HEART CATHETERIZATION WITH CORONARY/GRAFT ANGIOGRAM N/A 03/27/2013   Procedure: LEFT AND RIGHT HEART CATHETERIZATION WITH Beatrix Fetters;  Surgeon: Burnell Blanks, MD;  Location: [facility] C[facility];  Service: Cardiovascular;  Laterality: N/A;  . LOWER EXTREMITY ANGIOGRAM  09/08/2012; 10/06/2013   "found 100% blockage; unsuccessful attempt at crossing a chronic total occlusion of the left SFA in the setting of critical limb ischemia  . LOWER EXTREMITY ANGIOGRAM Bilateral 09/08/2013   Procedure: LOWER EXTREMITY ANGIOGRAM;  Surgeon: [name], MD;  Location: [facility] CATH LAB;  Service: Cardiovascular;  Laterality: Bilateral;  . PERIPHERAL VASCULAR CATHETERIZATION  11/04/2015   Procedure: Peripheral Vascular Atherectomy;  Surgeon: [name], MD;  Location: [facility];  Service: Cardiovascular;;  RT. SFA  . PERIPHERAL VASCULAR CATHETERIZATION Bilateral 11/04/2015   Procedure: Lower Extremity Intervention;  Surgeon: [name], MD;  Location: [facility];  Service: Cardiovascular;  Laterality: Bilateral;  . PERIPHERAL VASCULAR CATHETERIZATION  11/04/2015   Procedure: Peripheral Vascular Balloon Angioplasty;  Surgeon:  [name], MD;  Location: [facility];  Service: Cardiovascular;;  TP Trunk  . TOE SURGERY Left    "put pin in 2nd toe"  . TUBAL LIGATION    . WRIST FRACTURE SURGERY Left    "grafted bone from hip to wrist"    Current Medications: Outpatient Medications Prior to Visit  Medication Sig Dispense Refill  . acetaminophen (TYLENOL) 500 MG tablet Take 500-1,000 mg by mouth every 6 (six) hours as needed for mild pain or moderate pain.     [name] amLODipine (NORVASC) 10 MG tablet Take 1 tablet (10 mg total) by mouth daily. 90 tablet 3  . aspirin 81 MG EC tablet Take 81 mg by mouth daily.    [name] atorvastatin (LIPITOR) 40 MG tablet Take 1 tablet (40 mg total) by mouth daily at 6 PM. 90 tablet 3  . citalopram (CELEXA) 40 MG tablet Take 20 mg by mouth every morning.     . clopidogrel (PLAVIX) 75 MG tablet TAKE ONE TABLET BY MOUTH ONCE DAILY WITH  BREAKFAST 90 tablet 3  . clotrimazole-betamethasone (  LOTRISONE) cream Apply 1 application topically 2 (two) times daily as needed.    Mariane Baumgarten Calcium (STOOL SOFTENER PO) Take 200 mg by mouth at bedtime as needed (for constipation).     . gabapentin (NEURONTIN) 300 MG capsule Take 300-600 mg by mouth See admin instructions. 300 mg in the morning and 600 mg prior to bed(time)    . hydrALAZINE (APRESOLINE) 25 MG tablet Take 3 tablets (75 mg total) by mouth every 8 (eight) hours. 270 tablet 3  . insulin detemir (LEVEMIR) 100 UNIT/ML injection Inject 10 Units into the skin daily with breakfast.    . isosorbide mononitrate (IMDUR) 30 MG 24 hr tablet Take 1 tablet (30 mg total) by mouth daily. 90 tablet 3  . JANUVIA 25 MG tablet TAKE 1 TABLET BY MOUTH ONCE DAILY 30 tablet 3  . levothyroxine (SYNTHROID, LEVOTHROID) 75 MCG tablet Take 1 tablet (75 mcg total) by mouth daily before breakfast.    . Multiple Vitamin (MULTIVITAMIN WITH MINERALS) TABS tablet Take 1 tablet by mouth daily.    . nitroGLYCERIN (NITROSTAT) 0.4 MG SL tablet Place 1 tablet (0.4 mg total)  under the tongue every 5 (five) minutes as needed for chest pain. Up to 3 doses. If no relief after 3rd dose, proceed to the ED for an evaluation 25 tablet 3  . nystatin (MYCOSTATIN/NYSTOP) powder Apply 1 application topically 2 (two) times daily as needed.    . potassium chloride SA (K-DUR,KLOR-CON) 20 MEQ tablet Take 1 tablet (20 mEq total) by mouth 2 (two) times daily. 180 tablet 3  . tiZANidine (ZANAFLEX) 4 MG tablet Take 4 mg by mouth 2 (two) times daily as needed for muscle spasms.    . traMADol (ULTRAM) 50 MG tablet Take 50 mg by mouth.    . torsemide (DEMADEX) 20 MG tablet Take 3 tablets (60 mg total) by mouth 2 (two) times daily. 195 tablet 3  . albuterol (PROVENTIL HFA;VENTOLIN HFA) 108 (90 BASE) MCG/ACT inhaler Inhale 2 puffs into the lungs daily as needed for wheezing or shortness of breath.     . carvedilol (COREG) 12.5 MG tablet Take by mouth.    . umeclidinium-vilanterol (ANORO ELLIPTA) 62.5-25 MCG/INH AEPB Inhale 1 puff into the lungs daily.     No facility-administered medications prior to visit.      Allergies:   Other; Strawberry extract; Sulfa antibiotics; Coffee bean extract [coffea arabica]; and Tape   Social History   Socioeconomic History  . Marital status: Widowed    Spouse name: Not on file  . Number of children: Not on file  . Years of education: Not on file  . Highest education level: Not on file  Occupational History  . Not on file  Social Needs  . Financial resource strain: Not on file  . Food insecurity:    Worry: Not on file    Inability: Not on file  . Transportation needs:    Medical: Not on file    Non-medical: Not on file  Tobacco Use  . Smoking status: Former Smoker    Packs/day: 3.00    Years: 50.00    Pack years: 150.00    Types: Cigarettes    Start date: 01/24/1956    Last attempt to quit: 10/09/2009    Years since quitting: 7.5  . Smokeless tobacco: Never Used  Substance and Sexual Activity  . Alcohol use: Yes    Alcohol/week: 0.0 oz     Comment: 10/06/2013 "might have a drink q 2-3 months"  .  Drug use: Yes    Types: Marijuana    Comment: "smoked pot in my teens"  . Sexual activity: Not Currently  Lifestyle  . Physical activity:    Days per week: Not on file    Minutes per session: Not on file  . Stress: Not on file  Relationships  . Social connections:    Talks on phone: Not on file    Gets together: Not on file    Attends religious service: Not on file    Active member of club or organization: Not on file    Attends meetings of clubs or organizations: Not on file    Relationship status: Not on file  Other Topics Concern  . Not on file  Social History Narrative   Lives in [place] by herself.  She does not work.     Family History:  The patient's family history includes Cancer in her brother and sister; Coronary artery disease in her father; Diabetes in her daughter, mother, and son; Diabetes type II in her mother; Heart attack in her father; Heart disease in her father and mother; Heart failure in her sister; Hyperlipidemia in her daughter, mother, and son; Hypertension in her mother.   Review of Systems:   Please see the history of present illness.     General:  No chills, fever, night sweats or weight changes.  Cardiovascular:  No chest pain, dyspnea on exertion, orthopnea, palpitations, paroxysmal nocturnal dyspnea. Positive for abdominal distension and lower extremity edema.  Dermatological: No rash, lesions/masses Respiratory: No cough, dyspnea Urologic: No hematuria, dysuria Abdominal:   No nausea, vomiting, diarrhea, bright red blood per rectum, melena, or hematemesis Neurologic:  No visual changes, wkns, changes in mental status. All other systems reviewed and are otherwise negative except as noted above.   Physical Exam:    VS:  BP (!) 142/50   Pulse (!) 55   Ht 5' 6.5" (1.689 m)   Wt 204 lb (92.5 kg)   SpO2 94%   BMI 32.43 kg/m    General: Well developed, well nourished Caucasian female  appearing in no acute distress. Head: Normocephalic, atraumatic, sclera non-icteric, no xanthomas, nares are without discharge.  Neck: No carotid bruits. JVD not elevated.  Lungs: Respirations regular and unlabored, without wheezes or rales. On 2L [place].  Heart: Regular rate and rhythm. No S3 or S4.  No murmur, no rubs, or gallops appreciated. Abdomen: Soft, non-tender, with normoactive bowel sounds. No hepatomegaly. No rebound/guarding. No obvious abdominal masses. Appears distended.  Msk:  Strength and tone appear normal for age. No joint deformities or effusions. Extremities: No clubbing or cyanosis. Trace lower extremity edema.  Distal pedal pulses are 2+ bilaterally. Neuro: Alert and oriented X 3. Moves all extremities spontaneously. No focal deficits noted. Psych:  Responds to questions appropriately with a normal affect. Skin: No rashes or lesions noted  Wt Readings from Last 3 Encounters:  04/18/17 204 lb (92.5 kg)  04/04/17 196 lb 3.4 oz (89 kg)  11/21/16 184 lb 12 oz (83.8 kg)      Studies/Labs Reviewed:   EKG:  EKG is not ordered today.    Recent Labs: 07/31/2016: Magnesium 2.3 03/31/2017: ALT 16; B Natriuretic Peptide 475.0; Hemoglobin 10.1; Platelets 182; TSH 3.573 04/18/2017: BUN 74; Creatinine, Ser 1.89; Potassium 4.4; Sodium 138   Lipid Panel    Component Value Date/Time   CHOL 122 11/21/2016 1510   TRIG 113 11/21/2016 1510   HDL 41 11/21/2016 1510   CHOLHDL 3.0 11/21/2016  1510   VLDL 23 11/21/2016 1510   LDLCALC 58 11/21/2016 1510    Additional studies/ records that were reviewed today include:   Echocardiogram: 06/22/2016 Study Conclusions  - Left ventricle: The cavity size was normal. Wall thickness was   increased in a pattern of mild LVH. Systolic function was normal.   The estimated ejection fraction was in the range of 55% to 60%.   Wall motion was normal; there were no regional wall motion   abnormalities. Features are consistent with a pseudonormal  left   ventricular filling pattern, with concomitant abnormal relaxation   and increased filling pressure (grade 2 diastolic dysfunction). - Ventricular septum: The contour showed mild systolic flattening. - Left atrium: The atrium was moderately dilated. - Right ventricle: The cavity size was mildly dilated. - Right atrium: The atrium was moderately dilated. - Tricuspid valve: There was moderate regurgitation. - Pulmonary arteries: Systolic pressure was severely increased. PA   peak pressure: 84 mm Hg (S).   Assessment:    1. Diastolic CHF, chronic ([place])   2. Coronary artery disease involving native coronary artery of native heart without angina pectoris   3. Essential hypertension   4. Mixed hyperlipidemia   5. Stage 3 chronic kidney disease ([place])      Plan:   In order of problems listed above:  1. Chronic Diastolic CHF - recently admitted for an acute CHF exacerbation and diuresed well with IV Lasix, overall -4.6 L during admission with a discharge weight of 194 lbs. Since hospital discharge, she has noticed a gradual increase in her weight of over 10 pounds within the past 2 weeks. She reports breathing has been at baseline (on 2L [place]) but has noticed worsening abdominal distention and lower extremity edema. She is currently on Torsemide 60 mg twice daily and this was not increased at the time of her recent hospitalization.  Will increase to 80 mg twice daily. Recheck BMET today to assess kidney function and K+ levels. Will arrange for close follow-up within the next few weeks for reassessment of her volume status.   2. CAD - s/p CABG in 2012 with LIMA-LAD, SVG-PL, SVG-OM, and SVG-D1. - She denies any recent chest discomfort and reports breathing has been at baseline. - Continue ASA, Plavix, and statin therapy. No longer on a beta-blocker due to baseline bradycardia (HR in the 50's during today's visit).   3. HTN - BP slightly elevated at 142/50 during today's visit.  -  continue Amlodipine 55m daily and Hydralazine 795mTID. Losartan and Spironolactone currently held due to AKI. Will recheck BMET today. If kidney function improving, would anticipate restarting Spironolactone at her next office visit (would not do so currently as her Torsemide dosing was just increased today).   4. HLD - recent FLP in 11/2016 showed total cholesterol of 122, HDL 41, and LDL 58. - continue Atorvastatin 4022maily.   5. Stage 3 CKD - creatinine peaked at 2.33 during her recent admission. Was trending down to 2.15 on 3/27. - Will recheck a BMET today.   Medication Adjustments/Labs and Tests Ordered: Current medicines are reviewed at length with the patient today.  Concerns regarding medicines are outlined above.  Medication changes, Labs and Tests ordered today are listed in the Patient Instructions below. Patient Instructions  Medication Instructions:  Your physician has recommended you make the following change in your medication:  Increase Toresemide to 80 mg Two Times Daily    Labwork: Your physician recommends that you return for lab  work in: Today  Testing/Procedures: None  Follow-Up: Your physician recommends that you schedule a follow-up appointment in: 3 Weeks   Any Other Special Instructions Will Be Listed Below (If Applicable).   If you need a refill on your cardiac medications before your next appointment, please call your pharmacy.  Thank you for choosing [facility]!      Signed, [name] Heritage, PA-C  04/18/2017 5:15 PM    [place] [address] [place], [place] [address] Phone: [phone]

## 2017-04-18 ENCOUNTER — Encounter: Payer: Self-pay | Admitting: Student

## 2017-04-18 ENCOUNTER — Ambulatory Visit (INDEPENDENT_AMBULATORY_CARE_PROVIDER_SITE_OTHER): Payer: Medicare Other | Admitting: Student

## 2017-04-18 ENCOUNTER — Other Ambulatory Visit (HOSPITAL_COMMUNITY)
Admission: RE | Admit: 2017-04-18 | Discharge: 2017-04-18 | Disposition: A | Discharge status: Home / Self Care | Payer: Medicare Other | Source: Ambulatory Visit | Attending: Student | Admitting: Student

## 2017-04-18 VITALS — BP 142/50 | HR 55 | Ht 66.5 in | Wt 204.0 lb

## 2017-04-18 DIAGNOSIS — I5032 Chronic diastolic (congestive) heart failure: Secondary | ICD-10-CM | POA: Diagnosis not present

## 2017-04-18 DIAGNOSIS — N183 Chronic kidney disease, stage 3 unspecified: Secondary | ICD-10-CM

## 2017-04-18 DIAGNOSIS — I2510 Atherosclerotic heart disease of native coronary artery without angina pectoris: Secondary | ICD-10-CM | POA: Diagnosis not present

## 2017-04-18 DIAGNOSIS — J9621 Acute and chronic respiratory failure with hypoxia: Secondary | ICD-10-CM | POA: Diagnosis not present

## 2017-04-18 DIAGNOSIS — E782 Mixed hyperlipidemia: Secondary | ICD-10-CM | POA: Diagnosis not present

## 2017-04-18 DIAGNOSIS — E1151 Type 2 diabetes mellitus with diabetic peripheral angiopathy without gangrene: Secondary | ICD-10-CM | POA: Diagnosis not present

## 2017-04-18 DIAGNOSIS — E1122 Type 2 diabetes mellitus with diabetic chronic kidney disease: Secondary | ICD-10-CM | POA: Diagnosis not present

## 2017-04-18 DIAGNOSIS — I10 Essential (primary) hypertension: Secondary | ICD-10-CM

## 2017-04-18 DIAGNOSIS — I5033 Acute on chronic diastolic (congestive) heart failure: Secondary | ICD-10-CM | POA: Diagnosis not present

## 2017-04-18 DIAGNOSIS — I130 Hypertensive heart and chronic kidney disease with heart failure and stage 1 through stage 4 chronic kidney disease, or unspecified chronic kidney disease: Secondary | ICD-10-CM | POA: Diagnosis not present

## 2017-04-18 LAB — BASIC METABOLIC PANEL
ANION GAP: 13 (ref 5–15)
BUN: 74 mg/dL — AB (ref 6–20)
CO2: 29 mmol/L (ref 22–32)
Calcium: 9.2 mg/dL (ref 8.9–10.3)
Chloride: 96 mmol/L — ABNORMAL LOW (ref 101–111)
Creatinine, Ser: 1.89 mg/dL — ABNORMAL HIGH (ref 0.44–1.00)
GFR calc Af Amer: 30 mL/min — ABNORMAL LOW (ref >60)
GFR, EST NON AFRICAN AMERICAN: 26 mL/min — AB (ref >60)
GLUCOSE: 131 mg/dL — AB (ref 65–99)
Potassium: 4.4 mmol/L (ref 3.5–5.1)
SODIUM: 138 mmol/L (ref 135–145)

## 2017-04-18 MED ORDER — TORSEMIDE 20 MG PO TABS: 80.0000 mg | ORAL_TABLET | Freq: Two times a day (BID) | ORAL | 3 refills | Status: DC

## 2017-04-18 NOTE — Patient Instructions (Signed)
Medication Instructions:  Your physician has recommended you make the following change in your medication:  Increase Toresemide to 80 mg Two Times Daily    Labwork: Your physician recommends that you return for lab work in: Today  Testing/Procedures: None  Follow-Up: Your physician recommends that you schedule a follow-up appointment in: 3 Weeks    Any Other Special Instructions Will Be Listed Below (If Applicable).     If you need a refill on your cardiac medications before your next appointment, please call your pharmacy.  Thank you for choosing [facility]!

## 2017-04-20 DIAGNOSIS — I5033 Acute on chronic diastolic (congestive) heart failure: Secondary | ICD-10-CM | POA: Diagnosis not present

## 2017-04-20 DIAGNOSIS — E1122 Type 2 diabetes mellitus with diabetic chronic kidney disease: Secondary | ICD-10-CM | POA: Diagnosis not present

## 2017-04-20 DIAGNOSIS — I130 Hypertensive heart and chronic kidney disease with heart failure and stage 1 through stage 4 chronic kidney disease, or unspecified chronic kidney disease: Secondary | ICD-10-CM | POA: Diagnosis not present

## 2017-04-20 DIAGNOSIS — I2510 Atherosclerotic heart disease of native coronary artery without angina pectoris: Secondary | ICD-10-CM | POA: Diagnosis not present

## 2017-04-20 DIAGNOSIS — E1151 Type 2 diabetes mellitus with diabetic peripheral angiopathy without gangrene: Secondary | ICD-10-CM | POA: Diagnosis not present

## 2017-04-20 DIAGNOSIS — J9621 Acute and chronic respiratory failure with hypoxia: Secondary | ICD-10-CM | POA: Diagnosis not present

## 2017-04-23 DIAGNOSIS — E1151 Type 2 diabetes mellitus with diabetic peripheral angiopathy without gangrene: Secondary | ICD-10-CM | POA: Diagnosis not present

## 2017-04-23 DIAGNOSIS — I5033 Acute on chronic diastolic (congestive) heart failure: Secondary | ICD-10-CM | POA: Diagnosis not present

## 2017-04-23 DIAGNOSIS — E1122 Type 2 diabetes mellitus with diabetic chronic kidney disease: Secondary | ICD-10-CM | POA: Diagnosis not present

## 2017-04-23 DIAGNOSIS — I2510 Atherosclerotic heart disease of native coronary artery without angina pectoris: Secondary | ICD-10-CM | POA: Diagnosis not present

## 2017-04-23 DIAGNOSIS — J9621 Acute and chronic respiratory failure with hypoxia: Secondary | ICD-10-CM | POA: Diagnosis not present

## 2017-04-23 DIAGNOSIS — I130 Hypertensive heart and chronic kidney disease with heart failure and stage 1 through stage 4 chronic kidney disease, or unspecified chronic kidney disease: Secondary | ICD-10-CM | POA: Diagnosis not present

## 2017-04-25 DIAGNOSIS — J9621 Acute and chronic respiratory failure with hypoxia: Secondary | ICD-10-CM | POA: Diagnosis not present

## 2017-04-25 DIAGNOSIS — E1122 Type 2 diabetes mellitus with diabetic chronic kidney disease: Secondary | ICD-10-CM | POA: Diagnosis not present

## 2017-04-25 DIAGNOSIS — I2510 Atherosclerotic heart disease of native coronary artery without angina pectoris: Secondary | ICD-10-CM | POA: Diagnosis not present

## 2017-04-25 DIAGNOSIS — I130 Hypertensive heart and chronic kidney disease with heart failure and stage 1 through stage 4 chronic kidney disease, or unspecified chronic kidney disease: Secondary | ICD-10-CM | POA: Diagnosis not present

## 2017-04-25 DIAGNOSIS — I5033 Acute on chronic diastolic (congestive) heart failure: Secondary | ICD-10-CM | POA: Diagnosis not present

## 2017-04-25 DIAGNOSIS — E1151 Type 2 diabetes mellitus with diabetic peripheral angiopathy without gangrene: Secondary | ICD-10-CM | POA: Diagnosis not present

## 2017-04-26 DIAGNOSIS — Z299 Encounter for prophylactic measures, unspecified: Secondary | ICD-10-CM | POA: Diagnosis not present

## 2017-04-26 DIAGNOSIS — G20 Parkinson's disease: Secondary | ICD-10-CM | POA: Diagnosis not present

## 2017-04-26 DIAGNOSIS — E1142 Type 2 diabetes mellitus with diabetic polyneuropathy: Secondary | ICD-10-CM | POA: Diagnosis not present

## 2017-04-26 DIAGNOSIS — E1165 Type 2 diabetes mellitus with hyperglycemia: Secondary | ICD-10-CM | POA: Diagnosis not present

## 2017-04-26 DIAGNOSIS — E039 Hypothyroidism, unspecified: Secondary | ICD-10-CM | POA: Diagnosis not present

## 2017-04-26 DIAGNOSIS — I509 Heart failure, unspecified: Secondary | ICD-10-CM | POA: Diagnosis not present

## 2017-04-26 DIAGNOSIS — Z6833 Body mass index (BMI) 33.0-33.9, adult: Secondary | ICD-10-CM | POA: Diagnosis not present

## 2017-04-27 DIAGNOSIS — E1122 Type 2 diabetes mellitus with diabetic chronic kidney disease: Secondary | ICD-10-CM | POA: Diagnosis not present

## 2017-04-27 DIAGNOSIS — I130 Hypertensive heart and chronic kidney disease with heart failure and stage 1 through stage 4 chronic kidney disease, or unspecified chronic kidney disease: Secondary | ICD-10-CM | POA: Diagnosis not present

## 2017-04-27 DIAGNOSIS — J9621 Acute and chronic respiratory failure with hypoxia: Secondary | ICD-10-CM | POA: Diagnosis not present

## 2017-04-27 DIAGNOSIS — I2510 Atherosclerotic heart disease of native coronary artery without angina pectoris: Secondary | ICD-10-CM | POA: Diagnosis not present

## 2017-04-27 DIAGNOSIS — I5033 Acute on chronic diastolic (congestive) heart failure: Secondary | ICD-10-CM | POA: Diagnosis not present

## 2017-04-27 DIAGNOSIS — E1151 Type 2 diabetes mellitus with diabetic peripheral angiopathy without gangrene: Secondary | ICD-10-CM | POA: Diagnosis not present

## 2017-05-08 NOTE — Progress Notes (Signed)
Cardiology Office Note    Date:  05/09/2017   ID:  [name] [name], DOB 1945/04/22, MRN [id number]  PCP:  Glenda Chroman, MD  Cardiologist: Rozann Lesches, MD    Chief Complaint  Patient presents with  . Follow-up    3 week visit    History of Present Illness:    [name] [name] is a 72 y.o. female with past medical history of CAD (s/p CABG in 2012 with LIMA-LAD, SVG-PL, SVG-OM, and SVG-D1), chronic diastolic CHF, HTN, HLD, and Stage 3 CKD who presents to the office today for 3-week follow-up.  She was last examined by myself on 04/18/2017 following a recent hospitalization for an acute CHF exacerbation. At the time of her visit, she reported a gradual increase in her weight since discharge as this had trended up from 190 lbs to over 200 lbs on her home scales (was 204 lbs on office scales). Her Torsemide dosing was further increased from 60 mg twice daily to 80 mg twice daily with a repeat BMET obtained at that time which showed creatinine was stable at 1.89 and K+ was at 4.4.  In talking with the patient and her daughter today, she notes improvement in her shortness of breath and abdominal distention since her last office visit. She has been following weight on her home scales and this has declined from 202.8 lbs to 197.0 lbs over the past few weeks. A similar trend has been noticed on the office scales as she has lost over 4 pounds.  She denies any recent chest discomfort, palpitations, lightheadedness, dizziness, or presyncope. Does have baseline two-pillow orthopnea but denies any acute changes in this.   Past Medical History:  Diagnosis Date  . Arthritis   . Carotid artery disease (HCC)    a. Bilateral CEA; 50-60% bilateral ICA stenosis, 11/12  . Cellulitis    a. Recurrent, bilateral legs  . CHF (congestive heart failure) ([place])   . Chronic back pain   . Chronic diastolic heart failure ([place])   . Coronary atherosclerosis of native coronary artery    a. 11/2010 NSTEMI/CABG x 4:  L-LAD; S-PL; S-OM; S-DX, by PVT.  [name] Critical lower limb ischemia   . Depression   . Essential hypertension, benign   . Herniated disc   . History of blood transfusion   . Hypothyroidism   . Ischemic cardiomyopathy    a. 11/2010 TEE: EF 40-45%.  . Migraine   . Mixed hyperlipidemia   . NSTEMI (non-ST elevated myocardial infarction) ([place]) 11/2010  . On home oxygen therapy   . PAD (peripheral artery disease) ([place])   . Pleural effusion   . Pneumonia 2000's X 2  . Stroke ([place]) 08/2009  . Suicide attempt ([place]) 08/2002  . TIA (transient ischemic attack)   . TMJ syndrome   . Type 2 diabetes mellitus ([place])   . Venous insufficiency     Past Surgical History:  Procedure Laterality Date  . ABDOMINAL HYSTERECTOMY    . APPENDECTOMY    . CARDIAC CATHETERIZATION  11/2010  . CARDIAC CATHETERIZATION N/A 09/16/2015   Procedure: Right Heart Cath;  Surgeon: Larey Dresser, MD;  Location: [facility];  Service: Cardiovascular;  Laterality: N/A;  . CAROTID ENDARTERECTOMY Bilateral 2011   Bilateral - Dr. Kellie Simmering  . CHOLECYSTECTOMY    . COLONOSCOPY W/ BIOPSIES AND POLYPECTOMY    . CORONARY ANGIOPLASTY WITH STENT PLACEMENT  03/2013   "1"  . CORONARY ARTERY BYPASS GRAFT  11/24/2010  Procedure: CORONARY ARTERY BYPASS GRAFTING (CABG);  Surgeon: Tharon Aquas Adelene Idler, MD;  Location: [facility];  Service: Open Heart Surgery;  Laterality: N/A;  Coronary Artery Bypass Graft times four on pump utilizing left internal mammary artery and bilateral greater saphenous veins harvested endoscopically, transesophageal echocardiogram   . DEBRIDEMENT TOE Left    "nonhealing wound; 3rd digit  . DILATION AND CURETTAGE OF UTERUS    . ENDARTERECTOMY POPLITEAL Left 10/07/2013   Procedure: LEFT POPLITEAL ENDARTERECTOMY ;  Surgeon: Angelia Mould, MD;  Location: [facility];  Service: Vascular;  Laterality: Left;  . FEMORAL-POPLITEAL BYPASS GRAFT Left 10/07/2013   Procedure: LEFT FEMORAL-POPLITEAL ARTERY BYPASS GRAFT;  Surgeon:  Angelia Mould, MD;  Location: [facility];  Service: Vascular;  Laterality: Left;  . FRACTURE SURGERY    . INTRAOPERATIVE ARTERIOGRAM Left 10/07/2013   Procedure: INTRA OPERATIVE ARTERIOGRAM LEFT LEG;  Surgeon: Angelia Mould, MD;  Location: [facility];  Service: Vascular;  Laterality: Left;  . IR GENERIC HISTORICAL  09/21/2015   IR REMOVAL OF PLURAL CATH W/CUFF 09/21/2015 Marybelle Killings, MD [facility]-INTER[facility] RAD  . LEFT AND RIGHT HEART CATHETERIZATION WITH CORONARY/GRAFT ANGIOGRAM N/A 03/27/2013   Procedure: LEFT AND RIGHT HEART CATHETERIZATION WITH Beatrix Fetters;  Surgeon: Burnell Blanks, MD;  Location: [facility] C[facility];  Service: Cardiovascular;  Laterality: N/A;  . LOWER EXTREMITY ANGIOGRAM  09/08/2012; 10/06/2013   "found 100% blockage; unsuccessful attempt at crossing a chronic total occlusion of the left SFA in the setting of critical limb ischemia  . LOWER EXTREMITY ANGIOGRAM Bilateral 09/08/2013   Procedure: LOWER EXTREMITY ANGIOGRAM;  Surgeon: [name], MD;  [facility];  Service: Cardiovascular;  Laterality: Bilateral;  . PERIPHERAL VASCULAR CATHETERIZATION  11/04/2015   Procedure: Peripheral Vascular Atherectomy;  Surgeon: [name], MD;  Location: [facility];  Service: Cardiovascular;;  RT. SFA  . PERIPHERAL VASCULAR CATHETERIZATION Bilateral 11/04/2015   Procedure: Lower Extremity Intervention;  Surgeon: [name], MD;  Location: [facility];  Service: Cardiovascular;  Laterality: Bilateral;  . PERIPHERAL VASCULAR CATHETERIZATION  11/04/2015   Procedure: Peripheral Vascular Balloon Angioplasty;  Surgeon: [name], MD;  Location: [facility];  Service: Cardiovascular;;  TP Trunk  . TOE SURGERY Left    "put pin in 2nd toe"  . TUBAL LIGATION    . WRIST FRACTURE SURGERY Left    "grafted bone from hip to wrist"    Current Medications: Outpatient Medications Prior to Visit  Medication Sig Dispense Refill  .  acetaminophen (TYLENOL) 500 MG tablet Take 500-1,000 mg by mouth every 6 (six) hours as needed for mild pain or moderate pain.     [name] amLODipine (NORVASC) 10 MG tablet Take 1 tablet (10 mg total) by mouth daily. 90 tablet 3  . aspirin 81 MG EC tablet Take 81 mg by mouth daily.    [name] atorvastatin (LIPITOR) 40 MG tablet Take 1 tablet (40 mg total) by mouth daily at 6 PM. 90 tablet 3  . citalopram (CELEXA) 40 MG tablet Take 20 mg by mouth every morning.     . clopidogrel (PLAVIX) 75 MG tablet TAKE ONE TABLET BY MOUTH ONCE DAILY WITH  BREAKFAST 90 tablet 3  . clotrimazole-betamethasone (LOTRISONE) cream Apply 1 application topically 2 (two) times daily as needed.    Mariane Baumgarten Calcium (STOOL SOFTENER PO) Take 200 mg by mouth at bedtime as needed (for constipation).     . gabapentin (NEURONTIN) 300 MG capsule Take  300-600 mg by mouth See admin instructions. 300 mg in the morning and 600 mg prior to bed(time)    . hydrALAZINE (APRESOLINE) 25 MG tablet Take 3 tablets (75 mg total) by mouth every 8 (eight) hours. 270 tablet 3  . insulin detemir (LEVEMIR) 100 UNIT/ML injection Inject 10 Units into the skin daily with breakfast.    . isosorbide mononitrate (IMDUR) 30 MG 24 hr tablet Take 1 tablet (30 mg total) by mouth daily. 90 tablet 3  . JANUVIA 25 MG tablet TAKE 1 TABLET BY MOUTH ONCE DAILY 30 tablet 3  . levothyroxine (SYNTHROID, LEVOTHROID) 75 MCG tablet Take 1 tablet (75 mcg total) by mouth daily before breakfast.    . Multiple Vitamin (MULTIVITAMIN WITH MINERALS) TABS tablet Take 1 tablet by mouth daily.    . nitroGLYCERIN (NITROSTAT) 0.4 MG SL tablet Place 1 tablet (0.4 mg total) under the tongue every 5 (five) minutes as needed for chest pain. Up to 3 doses. If no relief after 3rd dose, proceed to the ED for an evaluation 25 tablet 3  . nystatin (MYCOSTATIN/NYSTOP) powder Apply 1 application topically 2 (two) times daily as needed.    . potassium chloride SA (K-DUR,KLOR-CON) 20 MEQ tablet Take 1  tablet (20 mEq total) by mouth 2 (two) times daily. 180 tablet 3  . tiZANidine (ZANAFLEX) 4 MG tablet Take 4 mg by mouth 2 (two) times daily as needed for muscle spasms.    [name] torsemide (DEMADEX) 20 MG tablet Take 4 tablets (80 mg total) by mouth 2 (two) times daily. 720 tablet 3  . traMADol (ULTRAM) 50 MG tablet Take 50 mg by mouth.     No facility-administered medications prior to visit.      Allergies:   Other; Strawberry extract; Sulfa antibiotics; Coffee bean extract [coffea arabica]; and Tape   Social History   Socioeconomic History  . Marital status: Widowed    Spouse name: Not on file  . Number of children: Not on file  . Years of education: Not on file  . Highest education level: Not on file  Occupational History  . Not on file  Social Needs  . Financial resource strain: Not on file  . Food insecurity:    Worry: Not on file    Inability: Not on file  . Transportation needs:    Medical: Not on file    Non-medical: Not on file  Tobacco Use  . Smoking status: Former Smoker    Packs/day: 3.00    Years: 50.00    Pack years: 150.00    Types: Cigarettes    Start date: 01/24/1956    Last attempt to quit: 10/09/2009    Years since quitting: 7.5  . Smokeless tobacco: Never Used  Substance and Sexual Activity  . Alcohol use: Yes    Alcohol/week: 0.0 oz    Comment: 10/06/2013 "might have a drink q 2-3 months"  . Drug use: Yes    Types: Marijuana    Comment: "smoked pot in my teens"  . Sexual activity: Not Currently  Lifestyle  . Physical activity:    Days per week: Not on file    Minutes per session: Not on file  . Stress: Not on file  Relationships  . Social connections:    Talks on phone: Not on file    Gets together: Not on file    Attends religious service: Not on file    Active member of club or organization: Not on file    Attends  meetings of clubs or organizations: Not on file    Relationship status: Not on file  Other Topics Concern  . Not on file  Social  History Narrative   Lives in [place] by herself.  She does not work.     Family History:  The patient's family history includes Cancer in her brother and sister; Coronary artery disease in her father; Diabetes in her daughter, mother, and son; Diabetes type II in her mother; Heart attack in her father; Heart disease in her father and mother; Heart failure in her sister; Hyperlipidemia in her daughter, mother, and son; Hypertension in her mother.   Review of Systems:   Please see the history of present illness.     General:  No chills, fever, night sweats or weight changes.  Cardiovascular:  No chest pain, edema, orthopnea, palpitations, paroxysmal nocturnal dyspnea. Positive for dyspnea on exertion.  Dermatological: No rash, lesions/masses Respiratory: No cough, dyspnea Urologic: No hematuria, dysuria Abdominal:   No nausea, vomiting, diarrhea, bright red blood per rectum, melena, or hematemesis Neurologic:  No visual changes, wkns, changes in mental status. All other systems reviewed and are otherwise negative except as noted above.   Physical Exam:    VS:  BP (!) 134/54   Pulse 60   Ht _0  (1.676 m)   Wt 200 lb (90.7 kg)   SpO2 94%   BMI 32.28 kg/m    General: Well developed, well nourished Caucasian female appearing in no acute distress. Head: Normocephalic, atraumatic, sclera non-icteric, no xanthomas, nares are without discharge.  Neck: No carotid bruits. JVD not elevated.  Lungs: Respirations regular and unlabored, without wheezes or rales. On 2L [place] at baseline.  Heart: Regular rate and rhythm. No S3 or S4.  No murmur, no rubs, or gallops appreciated. Abdomen: Soft, non-tender, non-distended with normoactive bowel sounds. No hepatomegaly. No rebound/guarding. No obvious abdominal masses. Msk:  Strength and tone appear normal for age. No joint deformities or effusions. Extremities: No clubbing or cyanosis. No lower extremity edema.  Distal pedal pulses are 2+  bilaterally. Neuro: Alert and oriented X 3. Moves all extremities spontaneously. No focal deficits noted. Psych:  Responds to questions appropriately with a normal affect. Skin: No rashes or lesions noted  Wt Readings from Last 3 Encounters:  05/09/17 200 lb (90.7 kg)  04/18/17 204 lb (92.5 kg)  04/04/17 196 lb 3.4 oz (89 kg)     Studies/Labs Reviewed:   EKG:  EKG is not ordered today.   Recent Labs: 07/31/2016: Magnesium 2.3 03/31/2017: ALT 16; B Natriuretic Peptide 475.0; Hemoglobin 10.1; Platelets 182; TSH 3.573 05/09/2017: BUN 65; Creatinine, Ser 1.77; Potassium 3.8; Sodium 136   Lipid Panel    Component Value Date/Time   CHOL 122 11/21/2016 1510   TRIG 113 11/21/2016 1510   HDL 41 11/21/2016 1510   CHOLHDL 3.0 11/21/2016 1510   VLDL 23 11/21/2016 1510   LDLCALC 58 11/21/2016 1510    Additional studies/ records that were reviewed today include:   Echocardiogram: 06/2016 Study Conclusions  - Left ventricle: The cavity size was normal. Wall thickness was   increased in a pattern of mild LVH. Systolic function was normal.   The estimated ejection fraction was in the range of 55% to 60%.   Wall motion was normal; there were no regional wall motion   abnormalities. Features are consistent with a pseudonormal left   ventricular filling pattern, with concomitant abnormal relaxation   and increased filling pressure (grade 2 diastolic dysfunction). -  Ventricular septum: The contour showed mild systolic flattening. - Left atrium: The atrium was moderately dilated. - Right ventricle: The cavity size was mildly dilated. - Right atrium: The atrium was moderately dilated. - Tricuspid valve: There was moderate regurgitation. - Pulmonary arteries: Systolic pressure was severely increased. PA   peak pressure: 84 mm Hg (S).  Assessment:    1. Diastolic CHF, chronic ([place])   2. Coronary artery disease involving native coronary artery of native heart without angina pectoris   3.  Essential hypertension   4. Mixed hyperlipidemia   5. Stage 3 chronic kidney disease ([place])      Plan:   In order of problems listed above:  1. Chronic Diastolic CHF - at the time of her last office visit, Torsemide was further titrated from 60 mg twice daily to 80 mg twice daily as she had experienced worsening dyspnea since hospital discharge and weight was trending upwards on her home scales. Since then, she has noticed improvement in her abdominal distention with weight having declined from 202.8 lbs to 197.0 lbs on her home scales.  - she reports still having baseline dyspnea on exertion (on 2L [place]) but says this has overall improved. Will recheck a BMET today to assess kidney function and K+ levels. If creatinine remains stable, will continue current Torsemide dosing.   2. CAD - s/p CABG in 2012 with LIMA-LAD, SVG-PL, SVG-OM, and SVG-D1. - she denies any recent chest pain and dyspnea continues to improve with diuresis.  - continue ASA, Plavix, Imdur, and statin therapy.   3. HTN - BP is well-controlled at 134/54 during today's visit. - continue Amlodipine 37m daily, Hydralazine 761mTID, and Imdur 3071maily. Will not restart Losartan or Spironolactone until creatinine is close to baseline.  4. HLD - FLP in 11/2016 showed LDL was 58. At goal of LDL < 70. - continue Atorvastatin 62m84mily.   5. Stage 3 CKD - baseline creatinine 1.4 - 1.5. Peaked at 2.33 during recent admission and trending down to 1.89 on 04/18/2017. Will recheck BMET today in the setting of recent diuretic dose adjustment.    Medication Adjustments/Labs and Tests Ordered: Current medicines are reviewed at length with the patient today.  Concerns regarding medicines are outlined above.  Medication changes, Labs and Tests ordered today are listed in the Patient Instructions below. Patient Instructions  Medication Instructions:  Your physician recommends that you continue on your current medications as directed.  Please refer to the Current Medication list given to you today.   Labwork: Your physician recommends that you return for lab work today.     Testing/Procedures: NONE   Follow-Up: Your physician recommends that you schedule a follow-up appointment in: 6-8 Weeks with Dr. [place] Any Other Special Instructions Will Be Listed Below (If Applicable).   If you need a refill on your cardiac medications before your next appointment, please call your pharmacy.  Thank you for choosing [facility]   Signed, BritErma Heritage-C  05/09/2017 7:36 PM    [id number] [id number]: [phone]

## 2017-05-09 ENCOUNTER — Other Ambulatory Visit (HOSPITAL_COMMUNITY)
Admission: RE | Admit: 2017-05-09 | Discharge: 2017-05-09 | Disposition: A | Discharge status: Home / Self Care | Payer: Medicare Other | Source: Ambulatory Visit | Attending: Student | Admitting: Student

## 2017-05-09 ENCOUNTER — Ambulatory Visit (INDEPENDENT_AMBULATORY_CARE_PROVIDER_SITE_OTHER): Payer: Medicare Other | Admitting: Student

## 2017-05-09 ENCOUNTER — Encounter: Payer: Self-pay | Admitting: Student

## 2017-05-09 VITALS — BP 134/54 | HR 60 | Ht 66.0 in | Wt 200.0 lb

## 2017-05-09 DIAGNOSIS — N183 Chronic kidney disease, stage 3 unspecified: Secondary | ICD-10-CM

## 2017-05-09 DIAGNOSIS — I2510 Atherosclerotic heart disease of native coronary artery without angina pectoris: Secondary | ICD-10-CM

## 2017-05-09 DIAGNOSIS — I5032 Chronic diastolic (congestive) heart failure: Secondary | ICD-10-CM

## 2017-05-09 DIAGNOSIS — I10 Essential (primary) hypertension: Secondary | ICD-10-CM

## 2017-05-09 DIAGNOSIS — E782 Mixed hyperlipidemia: Secondary | ICD-10-CM | POA: Diagnosis not present

## 2017-05-09 LAB — BASIC METABOLIC PANEL
Anion gap: 12 (ref 5–15)
BUN: 65 mg/dL — AB (ref 6–20)
CHLORIDE: 95 mmol/L — AB (ref 101–111)
CO2: 29 mmol/L (ref 22–32)
CREATININE: 1.77 mg/dL — AB (ref 0.44–1.00)
Calcium: 9 mg/dL (ref 8.9–10.3)
GFR calc Af Amer: 33 mL/min — ABNORMAL LOW (ref >60)
GFR calc non Af Amer: 28 mL/min — ABNORMAL LOW (ref >60)
GLUCOSE: 177 mg/dL — AB (ref 65–99)
Potassium: 3.8 mmol/L (ref 3.5–5.1)
SODIUM: 136 mmol/L (ref 135–145)

## 2017-05-09 NOTE — Patient Instructions (Signed)
Medication Instructions:  Your physician recommends that you continue on your current medications as directed. Please refer to the Current Medication list given to you today.   Labwork: Your physician recommends that you return for lab work today.     Testing/Procedures: NONE   Follow-Up: Your physician recommends that you schedule a follow-up appointment in: 6-8 Weeks with Dr. Domenic Polite in [place].    Any Other Special Instructions Will Be Listed Below (If Applicable).     If you need a refill on your cardiac medications before your next appointment, please call your pharmacy.  Thank you for choosing [facility]!

## 2017-07-06 ENCOUNTER — Other Ambulatory Visit: Payer: Self-pay | Admitting: Cardiology

## 2017-07-09 NOTE — Progress Notes (Deleted)
Cardiology Office Note  Date: 07/09/2017   ID: [name] [name], DOB [birth year], MRN [id number]  PCP: Glenda Chroman, MD  Primary Cardiologist: Rozann Lesches, MD   No chief complaint on file.   History of Present Illness: [name] [name] is a 72 y.o. female last seen by Ms. Strader PA-C in May.  Follow-up lab work in May revealed BUN 65 and creatinine 1.7 which had decreased.  Past Medical History:  Diagnosis Date  . Arthritis   . Carotid artery disease (HCC)    a. Bilateral CEA; 50-60% bilateral ICA stenosis, 11/12  . Cellulitis    a. Recurrent, bilateral legs  . CHF (congestive heart failure) ([place])   . Chronic back pain   . Chronic diastolic heart failure ([place])   . Coronary atherosclerosis of native coronary artery    a. 11/2010 NSTEMI/CABG x 4: L-LAD; S-PL; S-OM; S-DX, by PVT.  [name] Critical lower limb ischemia   . Depression   . Essential hypertension, benign   . Herniated disc   . History of blood transfusion   . Hypothyroidism   . Ischemic cardiomyopathy    a. 11/2010 TEE: EF 40-45%.  . Migraine   . Mixed hyperlipidemia   . NSTEMI (non-ST elevated myocardial infarction) ([place]) 11/2010  . On home oxygen therapy   . PAD (peripheral artery disease) ([place])   . Pleural effusion   . Pneumonia 2000's X 2  . Stroke ([place]) 08/2009  . Suicide attempt ([place]) 08/2002  . TIA (transient ischemic attack)   . TMJ syndrome   . Type 2 diabetes mellitus ([place])   . Venous insufficiency     Past Surgical History:  Procedure Laterality Date  . ABDOMINAL HYSTERECTOMY    . APPENDECTOMY    . CARDIAC CATHETERIZATION  11/2010  . CARDIAC CATHETERIZATION N/A 09/16/2015   Procedure: Right Heart Cath;  Surgeon: Larey Dresser, MD;  Location: [place] [facility];  Service: Cardiovascular;  Laterality: N/A;  . CAROTID ENDARTERECTOMY Bilateral 2011   Bilateral - Dr. Kellie Simmering  . CHOLECYSTECTOMY    . COLONOSCOPY W/ BIOPSIES AND POLYPECTOMY    . CORONARY ANGIOPLASTY WITH STENT PLACEMENT  03/2013   "1"  . CORONARY ARTERY BYPASS GRAFT  11/24/2010   Procedure: CORONARY ARTERY BYPASS GRAFTING (CABG);  Surgeon: Tharon Aquas Adelene Idler, MD;  Location: [facility];  Service: Open Heart Surgery;  Laterality: N/A;  Coronary Artery Bypass Graft times four on pump utilizing left internal mammary artery and bilateral greater saphenous veins harvested endoscopically, transesophageal echocardiogram   . DEBRIDEMENT TOE Left    "nonhealing wound; 3rd digit  . DILATION AND CURETTAGE OF UTERUS    . ENDARTERECTOMY POPLITEAL Left 10/07/2013   Procedure: LEFT POPLITEAL ENDARTERECTOMY ;  Surgeon: Angelia Mould, MD;  Location: [facility];  Service: Vascular;  Laterality: Left;  . FEMORAL-POPLITEAL BYPASS GRAFT Left 10/07/2013   Procedure: LEFT FEMORAL-POPLITEAL ARTERY BYPASS GRAFT;  Surgeon: Angelia Mould, MD;  Location: [facility];  Service: Vascular;  Laterality: Left;  . FRACTURE SURGERY    . INTRAOPERATIVE ARTERIOGRAM Left 10/07/2013   Procedure: INTRA OPERATIVE ARTERIOGRAM LEFT LEG;  Surgeon: Angelia Mould, MD;  Location: [facility];  Service: Vascular;  Laterality: Left;  . IR GENERIC HISTORICAL  09/21/2015   IR REMOVAL OF PLURAL CATH W/CUFF 09/21/2015 Marybelle Killings, MD [facility]-INTERV RAD  . LEFT AND RIGHT HEART CATHETERIZATION WITH CORONARY/GRAFT ANGIOGRAM N/A 03/27/2013   Procedure: LEFT AND RIGHT HEART CATHETERIZATION WITH CORONARY/GRAFT ANGIOGRAM;  Surgeon: Annita Brod  Angelena Form, MD;  Location: [facility];  Service: Cardiovascular;  Laterality: N/A;  . LOWER EXTREMITY ANGIOGRAM  09/08/2012; 10/06/2013   "found 100% blockage; unsuccessful attempt at crossing a chronic total occlusion of the left SFA in the setting of critical limb ischemia  . LOWER EXTREMITY ANGIOGRAM Bilateral 09/08/2013   Procedure: LOWER EXTREMITY ANGIOGRAM;  Surgeon: [name], MD;  Location: [facility] CATH [facility];  Service: Cardiovascular;  Laterality: Bilateral;  . PERIPHERAL VASCULAR CATHETERIZATION  11/04/2015   Procedure: Peripheral Vascular  Atherectomy;  Surgeon: [name], MD;  Location: [facility];  Service: Cardiovascular;;  RT. SFA  . PERIPHERAL VASCULAR CATHETERIZATION Bilateral 11/04/2015   Procedure: Lower Extremity Intervention;  Surgeon: [name], MD;  Location: [facility];  Service: Cardiovascular;  Laterality: Bilateral;  . PERIPHERAL VASCULAR CATHETERIZATION  11/04/2015   Procedure: Peripheral Vascular Balloon Angioplasty;  Surgeon: [name], MD;  Location: [facility] [facility];  Service: Cardiovascular;;  TP Trunk  . TOE SURGERY Left    "put pin in 2nd toe"  . TUBAL LIGATION    . WRIST FRACTURE SURGERY Left    "grafted bone from hip to wrist"    Current Outpatient Medications  Medication Sig Dispense Refill  . acetaminophen (TYLENOL) 500 MG tablet Take 500-1,000 mg by mouth every 6 (six) hours as needed for mild pain or moderate pain.     [name] amLODipine (NORVASC) 10 MG tablet Take 1 tablet (10 mg total) by mouth daily. 90 tablet 3  . aspirin 81 MG EC tablet Take 81 mg by mouth daily.    [name] atorvastatin (LIPITOR) 40 MG tablet Take 1 tablet (40 mg total) by mouth daily at 6 PM. 90 tablet 3  . citalopram (CELEXA) 40 MG tablet Take 20 mg by mouth every morning.     . clopidogrel (PLAVIX) 75 MG tablet TAKE ONE TABLET BY MOUTH ONCE DAILY WITH  BREAKFAST 90 tablet 3  . clotrimazole-betamethasone (LOTRISONE) cream Apply 1 application topically 2 (two) times daily as needed.    Mariane Baumgarten Calcium (STOOL SOFTENER PO) Take 200 mg by mouth at bedtime as needed (for constipation).     . gabapentin (NEURONTIN) 300 MG capsule Take 300-600 mg by mouth See admin instructions. 300 mg in the morning and 600 mg prior to bed(time)    . hydrALAZINE (APRESOLINE) 25 MG tablet Take 3 tablets (75 mg total) by mouth every 8 (eight) hours. 270 tablet 3  . insulin detemir (LEVEMIR) 100 UNIT/ML injection Inject 10 Units into the skin daily with breakfast.    . isosorbide mononitrate (IMDUR) 30 MG 24 hr tablet  Take 1 tablet (30 mg total) by mouth daily. 90 tablet 3  . JANUVIA 25 MG tablet TAKE 1 TABLET BY MOUTH ONCE DAILY 30 tablet 3  . levothyroxine (SYNTHROID, LEVOTHROID) 75 MCG tablet Take 1 tablet (75 mcg total) by mouth daily before breakfast.    . Multiple Vitamin (MULTIVITAMIN WITH MINERALS) TABS tablet Take 1 tablet by mouth daily.    . nitroGLYCERIN (NITROSTAT) 0.4 MG SL tablet Place 1 tablet (0.4 mg total) under the tongue every 5 (five) minutes as needed for chest pain. Up to 3 doses. If no relief after 3rd dose, proceed to the ED for an evaluation 25 tablet 3  . nystatin (MYCOSTATIN/NYSTOP) powder Apply 1 application topically 2 (two) times daily as needed.    . potassium chloride SA (K-DUR,KLOR-CON) 20 MEQ tablet Take 1 tablet (20 mEq total) by mouth 2 (  two) times daily. 180 tablet 3  . tiZANidine (ZANAFLEX) 4 MG tablet Take 4 mg by mouth 2 (two) times daily as needed for muscle spasms.    [name] torsemide (DEMADEX) 20 MG tablet Take 4 tablets (80 mg total) by mouth 2 (two) times daily. 720 tablet 3  . torsemide (DEMADEX) 20 MG tablet  TAKE 3 TABLETS BY MOUTH IN THE MORNING AND 2 TABLETS IN THE EVENING MAY  TAKE  AN  ADDITIONAL  TABLET  IF  WEIGHT  INCREASES 195 tablet 3  . traMADol (ULTRAM) 50 MG tablet Take 50 mg by mouth.     No current facility-administered medications for this visit.    Allergies:  Other; Strawberry extract; Sulfa antibiotics; Coffee bean extract [coffea arabica]; and Tape   Social History: The patient  reports that she quit smoking about 7 years ago. Her smoking use included cigarettes. She started smoking about 61 years ago. She has a 150.00 pack-year smoking history. She has never used smokeless tobacco. She reports that she drinks alcohol. She reports that she has current or past drug history. Drug: Marijuana.   Family History: The patient's family history includes Cancer in her brother and sister; Coronary artery disease in her father; Diabetes in her daughter, mother,  and son; Diabetes type II in her mother; Heart attack in her father; Heart disease in her father and mother; Heart failure in her sister; Hyperlipidemia in her daughter, mother, and son; Hypertension in her mother.   ROS:  Please see the history of present illness. Otherwise, complete review of systems is positive for {NONE DEFAULTED:18576::"none"}.  All other systems are reviewed and negative.   Physical Exam: VS:  There were no vitals taken for this visit., BMI There is no height or weight on file to calculate BMI.  Wt Readings from Last 3 Encounters:  05/09/17 200 lb (90.7 kg)  04/18/17 204 lb (92.5 kg)  04/04/17 196 lb 3.4 oz (89 kg)    General: Patient appears comfortable at rest. HEENT: Conjunctiva and lids normal, oropharynx clear with moist mucosa. Neck: Supple, no elevated JVP or carotid bruits, no thyromegaly. Lungs: Clear to auscultation, nonlabored breathing at rest. Cardiac: Regular rate and rhythm, no S3 or significant systolic murmur, no pericardial rub. Abdomen: Soft, nontender, no hepatomegaly, bowel sounds present, no guarding or rebound. Extremities: No pitting edema, distal pulses 2+. Skin: Warm and dry. Musculoskeletal: No kyphosis. Neuropsychiatric: Alert and oriented x3, affect grossly appropriate.  ECG: I personally reviewed the tracing from 04/02/2017 which showed sinus bradycardia.  Recent Labwork: 07/31/2016: Magnesium 2.3 03/31/2017: ALT 16; AST 21; B Natriuretic Peptide 475.0; Hemoglobin 10.1; Platelets 182; TSH 3.573 05/09/2017: BUN 65; Creatinine, Ser 1.77; Potassium 3.8; Sodium 136     Component Value Date/Time   CHOL 122 11/21/2016 1510   TRIG 113 11/21/2016 1510   HDL 41 11/21/2016 1510   CHOLHDL 3.0 11/21/2016 1510   VLDL 23 11/21/2016 1510   LDLCALC 58 11/21/2016 1510    Other Studies Reviewed Today:  Echocardiogram 06/22/2016: Study Conclusions  - Left ventricle: The cavity size was normal. Wall thickness was   increased in a pattern of  mild LVH. Systolic function was normal.   The estimated ejection fraction was in the range of 55% to 60%.   Wall motion was normal; there were no regional wall motion   abnormalities. Features are consistent with a pseudonormal left   ventricular filling pattern, with concomitant abnormal relaxation   and increased filling pressure (grade 2 diastolic dysfunction). -  Ventricular septum: The contour showed mild systolic flattening. - Left atrium: The atrium was moderately dilated. - Right ventricle: The cavity size was mildly dilated. - Right atrium: The atrium was moderately dilated. - Tricuspid valve: There was moderate regurgitation. - Pulmonary arteries: Systolic pressure was severely increased. PA   peak pressure: 84 mm Hg (S).  Assessment and Plan:   Current medicines were reviewed with the patient today.  No orders of the defined types were placed in this encounter.   Disposition:  Signed, Satira Sark, MD, [facility] 07/09/2017 1:16 PM    [facility] at [facility] [address], [place], [place] [address] Phone: [phone]; Fax: [phone]

## 2017-07-10 ENCOUNTER — Ambulatory Visit: Payer: Medicare Other | Admitting: Cardiology

## 2017-08-08 NOTE — Progress Notes (Signed)
Cardiology Office Note  Date: 08/09/2017   ID: [name] [name], DOB Mar 17, 1945, MRN [id number]  PCP: Glenda Chroman, MD  Primary Cardiologist: Rozann Lesches, MD   Chief Complaint  Patient presents with  . Diastolic heart failure    History of Present Illness: [name] [name] is a 72 y.o. female last seen by Ms. Strader PA-C in March.  She is here today with her daughter for a follow-up visit.  In terms of fluid weight, she has been doing better on current Demadex dose at 80 mg twice daily.  Her weight is down about 8 pounds overall, we went over her outpatient weight log as well.  She does not report any increasing shortness of breath over baseline, continues to use supplemental oxygen as before.  She has had no chest pain or palpitations.  Last echocardiogram was in June 2018 as outlined below.  We discussed obtaining an updated study.  I reviewed her current medications which are outlined below.  Past Medical History:  Diagnosis Date  . Arthritis   . Carotid artery disease (HCC)    a. Bilateral CEA; 50-60% bilateral ICA stenosis, 11/12  . Cellulitis    a. Recurrent, bilateral legs  . CHF (congestive heart failure) ([place])   . Chronic back pain   . Chronic diastolic heart failure ([place])   . Coronary atherosclerosis of native coronary artery    a. 11/2010 NSTEMI/CABG x 4: L-LAD; S-PL; S-OM; S-DX, by PVT.  [name] Critical lower limb ischemia   . Depression   . Essential hypertension, benign   . Herniated disc   . History of blood transfusion   . Hypothyroidism   . Ischemic cardiomyopathy    a. 11/2010 TEE: EF 40-45%.  . Migraine   . Mixed hyperlipidemia   . NSTEMI (non-ST elevated myocardial infarction) ([place]) 11/2010  . On home oxygen therapy   . PAD (peripheral artery disease) ([place])   . Pleural effusion   . Pneumonia 2000's X 2  . Stroke ([place]) 08/2009  . Suicide attempt ([place]) 08/2002  . TIA (transient ischemic attack)   . TMJ syndrome   . Type 2 diabetes mellitus ([place])   .  Venous insufficiency     Past Surgical History:  Procedure Laterality Date  . ABDOMINAL HYSTERECTOMY    . APPENDECTOMY    . CARDIAC CATHETERIZATION  11/2010  . CARDIAC CATHETERIZATION N/A 09/16/2015   Procedure: Right Heart Cath;  Surgeon: Larey Dresser, MD;  Location: [place];  Service: Cardiovascular;  Laterality: N/A;  . CAROTID ENDARTERECTOMY Bilateral 2011   Bilateral - Dr. Kellie Simmering  . CHOLECYSTECTOMY    . COLONOSCOPY W/ BIOPSIES AND POLYPECTOMY    . CORONARY ANGIOPLASTY WITH STENT PLACEMENT  03/2013   "1"  . CORONARY ARTERY BYPASS GRAFT  11/24/2010   Procedure: CORONARY ARTERY BYPASS GRAFTING (CABG);  Surgeon: Tharon Aquas Adelene Idler, MD;  Location: [place] [facility];  Service: Open Heart Surgery;  Laterality: N/A;  Coronary Artery Bypass Graft times four on pump utilizing left internal mammary artery and bilateral greater saphenous veins harvested endoscopically, transesophageal echocardiogram   . DEBRIDEMENT TOE Left    "nonhealing wound; 3rd digit  . DILATION AND CURETTAGE OF UTERUS    . ENDARTERECTOMY POPLITEAL Left 10/07/2013   Procedure: LEFT POPLITEAL ENDARTERECTOMY ;  Surgeon: Angelia Mould, MD;  Location: [facility];  Service: Vascular;  Laterality: Left;  . FEMORAL-POPLITEAL BYPASS GRAFT Left 10/07/2013   Procedure: LEFT FEMORAL-POPLITEAL ARTERY BYPASS GRAFT;  Surgeon:  Angelia Mould, MD;  Location: [facility];  Service: Vascular;  Laterality: Left;  . FRACTURE SURGERY    . INTRAOPERATIVE ARTERIOGRAM Left 10/07/2013   Procedure: INTRA OPERATIVE ARTERIOGRAM LEFT LEG;  Surgeon: Angelia Mould, MD;  Location: [facility];  Service: Vascular;  Laterality: Left;  . IR GENERIC HISTORICAL  09/21/2015   IR REMOVAL OF PLURAL CATH W/CUFF 09/21/2015 Marybelle Killings, MD [facility]-INTERV RAD  . LEFT AND RIGHT HEART CATHETERIZATION WITH CORONARY/GRAFT ANGIOGRAM N/A 03/27/2013   Procedure: LEFT AND RIGHT HEART CATHETERIZATION WITH Beatrix Fetters;  Surgeon: Burnell Blanks, MD;   Location: [facility] CATH LAB;  Service: Cardiovascular;  Laterality: N/A;  . LOWER EXTREMITY ANGIOGRAM  09/08/2012; 10/06/2013   "found 100% blockage; unsuccessful attempt at crossing a chronic total occlusion of the left SFA in the setting of critical limb ischemia  . LOWER EXTREMITY ANGIOGRAM Bilateral 09/08/2013   Procedure: LOWER EXTREMITY ANGIOGRAM;  Surgeon: [name], MD;  [facility]: [facility] C[facility];  Service: Cardiovascular;  Laterality: Bilateral;  . PERIPHERAL VASCULAR CATHETERIZATION  11/04/2015   Procedure: Peripheral Vascular Atherectomy;  Surgeon: [name], MD;  Location: [place] [facility];  Service: Cardiovascular;;  RT. SFA  . PERIPHERAL VASCULAR CATHETERIZATION Bilateral 11/04/2015   Procedure: Lower Extremity Intervention;  Surgeon: [name], MD;  Location: [place] [facility];  Service: Cardiovascular;  Laterality: Bilateral;  . PERIPHERAL VASCULAR CATHETERIZATION  11/04/2015   Procedure: Peripheral Vascular Balloon Angioplasty;  Surgeon: [name], MD;  Location: [place] [facility];  Service: Cardiovascular;;  TP Trunk  . TOE SURGERY Left    "put pin in 2nd toe"  . TUBAL LIGATION    . WRIST FRACTURE SURGERY Left    "grafted bone from hip to wrist"    Current Outpatient Medications  Medication Sig Dispense Refill  . acetaminophen (TYLENOL) 500 MG tablet Take 500-1,000 mg by mouth every 6 (six) hours as needed for mild pain or moderate pain.     [name] amLODipine (NORVASC) 10 MG tablet Take 1 tablet (10 mg total) by mouth daily. 90 tablet 3  . aspirin 81 MG EC tablet Take 81 mg by mouth daily.    [name] atorvastatin (LIPITOR) 40 MG tablet Take 1 tablet (40 mg total) by mouth daily at 6 PM. 90 tablet 3  . citalopram (CELEXA) 40 MG tablet Take 20 mg by mouth every morning.     . clopidogrel (PLAVIX) 75 MG tablet TAKE ONE TABLET BY MOUTH ONCE DAILY WITH  BREAKFAST 90 tablet 3  . clotrimazole-betamethasone (LOTRISONE) cream Apply 1 application topically 2 (two) times  daily as needed.    Mariane Baumgarten Calcium (STOOL SOFTENER PO) Take 200 mg by mouth at bedtime as needed (for constipation).     . gabapentin (NEURONTIN) 300 MG capsule Take 300-600 mg by mouth See admin instructions. 300 mg in the morning and 600 mg prior to bed(time)    . hydrALAZINE (APRESOLINE) 25 MG tablet Take 3 tablets (75 mg total) by mouth every 8 (eight) hours. 270 tablet 3  . insulin detemir (LEVEMIR) 100 UNIT/ML injection Inject 10 Units into the skin daily with breakfast.    . isosorbide mononitrate (IMDUR) 30 MG 24 hr tablet Take 1 tablet (30 mg total) by mouth daily. 90 tablet 3  . JANUVIA 25 MG tablet TAKE 1 TABLET BY MOUTH ONCE DAILY 30 tablet 3  . levothyroxine (SYNTHROID, LEVOTHROID) 75 MCG tablet Take 1 tablet (75 mcg total) by mouth daily before breakfast.    .  Multiple Vitamin (MULTIVITAMIN WITH MINERALS) TABS tablet Take 1 tablet by mouth daily.    . nitroGLYCERIN (NITROSTAT) 0.4 MG SL tablet Place 1 tablet (0.4 mg total) under the tongue every 5 (five) minutes as needed for chest pain. Up to 3 doses. If no relief after 3rd dose, proceed to the ED for an evaluation 25 tablet 3  . nystatin (MYCOSTATIN/NYSTOP) powder Apply 1 application topically 2 (two) times daily as needed.    . potassium chloride SA (K-DUR,KLOR-CON) 20 MEQ tablet Take 1 tablet (20 mEq total) by mouth 2 (two) times daily. 180 tablet 3  . tiZANidine (ZANAFLEX) 4 MG tablet Take 4 mg by mouth 2 (two) times daily as needed for muscle spasms.    [name] torsemide (DEMADEX) 20 MG tablet Take 4 tablets (80 mg total) by mouth 2 (two) times daily. 720 tablet 3  . traMADol (ULTRAM) 50 MG tablet Take 50 mg by mouth.     No current facility-administered medications for this visit.    Allergies:  Other; Strawberry extract; Sulfa antibiotics; Coffee bean extract [coffea arabica]; and Tape   Social History: The patient  reports that she quit smoking about 7 years ago. Her smoking use included cigarettes. She started smoking about  61 years ago. She has a 150.00 pack-year smoking history. She has never used smokeless tobacco. She reports that she drinks alcohol. She reports that she has current or past drug history. Drug: Marijuana.   ROS:  Please see the history of present illness. Otherwise, complete review of systems is positive for chronic shortness of breath.  All other systems are reviewed and negative.   Physical Exam: VS:  BP (!) 123/42   Pulse (!) 53   Ht _0  (1.676 m)   Wt 192 lb 9.6 oz (87.4 kg)   SpO2 90%   BMI 31.09 kg/m , BMI Body mass index is 31.09 kg/m.  Wt Readings from Last 3 Encounters:  08/09/17 192 lb 9.6 oz (87.4 kg)  05/09/17 200 lb (90.7 kg)  04/18/17 204 lb (92.5 kg)    General: Chronically ill-appearing woman, wearing oxygen via nasal cannula. HEENT: Conjunctiva and lids normal, oropharynx clear. Neck: Supple, no elevated JVP, bilateral CEA scars, no thyromegaly. Lungs: Decreased basilar breath sounds, nonlabored breathing at rest. Cardiac: Regular rate and rhythm, no S3, 2/6 systolic murmur. Abdomen: Soft, nontender, bowel sounds present. Extremities: No pitting edema with venous stasis, distal pulses 1-2+. Skin: Warm and dry. Musculoskeletal: No kyphosis. Neuropsychiatric: Alert and oriented x3, affect grossly appropriate.  ECG: I personally reviewed the tracing from 03/31/2017 which showed sinus bradycardia.  Recent Labwork: 03/31/2017: ALT 16; AST 21; B Natriuretic Peptide 475.0; Hemoglobin 10.1; Platelets 182; TSH 3.573 05/09/2017: BUN 65; Creatinine, Ser 1.77; Potassium 3.8; Sodium 136     Component Value Date/Time   CHOL 122 11/21/2016 1510   TRIG 113 11/21/2016 1510   HDL 41 11/21/2016 1510   CHOLHDL 3.0 11/21/2016 1510   VLDL 23 11/21/2016 1510   LDLCALC 58 11/21/2016 1510    Other Studies Reviewed Today:  Echocardiogram 06/22/2016: Study Conclusions  - Left ventricle: The cavity size was normal. Wall thickness was   increased in a pattern of mild LVH.  Systolic function was normal.   The estimated ejection fraction was in the range of 55% to 60%.   Wall motion was normal; there were no regional wall motion   abnormalities. Features are consistent with a pseudonormal left   ventricular filling pattern, with concomitant abnormal relaxation  and increased filling pressure (grade 2 diastolic dysfunction). - Ventricular septum: The contour showed mild systolic flattening. - Left atrium: The atrium was moderately dilated. - Right ventricle: The cavity size was mildly dilated. - Right atrium: The atrium was moderately dilated. - Tricuspid valve: There was moderate regurgitation. - Pulmonary arteries: Systolic pressure was severely increased. PA   peak pressure: 84 mm Hg (S).  Assessment and Plan:  1.  Chronic diastolic heart failure associated with right ventricular dysfunction and pulmonary hypertension.  She is clinically stable at this time on supplemental oxygen, her weight is down about 8 pounds.  Plan to continue Demadex 80 mg twice daily with potassium supplements, she has been taken off ARB and Aldactone with renal insufficiency.  Follow-up BMET.  Plan to repeat echocardiogram in comparison to study from last June.  2.  CKD stage 3, last creatinine 1.77.  Follow-up BMET.  3.  Multivessel CAD status post CABG and graft intervention.  She reports no angina.  Continue aspirin and statin therapy.  Current medicines were reviewed with the patient today.   Orders Placed This Encounter  Procedures  . Basic metabolic panel  . ECHOCARDIOGRAM COMPLETE    Disposition: Follow-up in 3 months.  Signed, Satira Sark, MD, [facility] 08/09/2017 11:56 AM    [facility] [place], [place], [place] [address] Phone: [phone]; Fax: [phone]

## 2017-08-09 ENCOUNTER — Ambulatory Visit (INDEPENDENT_AMBULATORY_CARE_PROVIDER_SITE_OTHER): Payer: Medicare Other | Admitting: Cardiology

## 2017-08-09 ENCOUNTER — Encounter: Payer: Self-pay | Admitting: Cardiology

## 2017-08-09 VITALS — BP 123/42 | HR 53 | Ht 66.0 in | Wt 192.6 lb

## 2017-08-09 DIAGNOSIS — I5032 Chronic diastolic (congestive) heart failure: Secondary | ICD-10-CM | POA: Diagnosis not present

## 2017-08-09 DIAGNOSIS — N183 Chronic kidney disease, stage 3 unspecified: Secondary | ICD-10-CM

## 2017-08-09 DIAGNOSIS — I2510 Atherosclerotic heart disease of native coronary artery without angina pectoris: Secondary | ICD-10-CM

## 2017-08-09 NOTE — Patient Instructions (Signed)
Medication Instructions:  Your physician recommends that you continue on your current medications as directed. Please refer to the Current Medication list given to you today.  Labwork: BMET Orders given today  Testing/Procedures: Your physician has requested that you have an echocardiogram. Echocardiography is a painless test that uses sound waves to create images of your heart. It provides your doctor with information about the size and shape of your heart and how well your heart's chambers and valves are working. This procedure takes approximately one hour. There are no restrictions for this procedure.  Follow-Up: Your physician recommends that you schedule a follow-up appointment in: 3 MONTHS WITH DR. MCDOWELL  Any Other Special Instructions Will Be Listed Below (If Applicable).  If you need a refill on your cardiac medications before your next appointment, please call your pharmacy.

## 2017-08-21 ENCOUNTER — Other Ambulatory Visit: Payer: Self-pay

## 2017-08-21 ENCOUNTER — Ambulatory Visit (INDEPENDENT_AMBULATORY_CARE_PROVIDER_SITE_OTHER): Payer: Medicare Other

## 2017-08-21 DIAGNOSIS — I5032 Chronic diastolic (congestive) heart failure: Secondary | ICD-10-CM

## 2017-08-27 ENCOUNTER — Telehealth: Payer: Self-pay

## 2017-08-27 NOTE — Telephone Encounter (Signed)
Patients daughter notified. Routed to PCP

## 2017-08-27 NOTE — Telephone Encounter (Signed)
-----  Message from Satira Sark, MD sent at 08/22/2017  8:27 AM EDT ----- Results reviewed.  LVEF remains normal at 60 to 14% with diastolic dysfunction.  Right ventricular dysfunction noted which is expected with her pulmonary hypertension, although PA pressures have decreased compared to previous study.  Continue with current medical therapy. A copy of this test should be forwarded to Glenda Chroman, MD.

## 2017-08-29 ENCOUNTER — Other Ambulatory Visit (HOSPITAL_COMMUNITY): Payer: Self-pay | Admitting: Student

## 2017-09-03 ENCOUNTER — Telehealth: Payer: Self-pay | Admitting: *Deleted

## 2017-09-03 ENCOUNTER — Other Ambulatory Visit (HOSPITAL_COMMUNITY)
Admission: RE | Admit: 2017-09-03 | Discharge: 2017-09-03 | Disposition: A | Discharge status: Home / Self Care | Payer: Medicare Other | Source: Ambulatory Visit | Attending: Cardiology | Admitting: Cardiology

## 2017-09-03 DIAGNOSIS — I5032 Chronic diastolic (congestive) heart failure: Secondary | ICD-10-CM | POA: Insufficient documentation

## 2017-09-03 LAB — BASIC METABOLIC PANEL
ANION GAP: 10 (ref 5–15)
BUN: 68 mg/dL — AB (ref 8–23)
CHLORIDE: 93 mmol/L — AB (ref 98–111)
CO2: 29 mmol/L (ref 22–32)
Calcium: 8.8 mg/dL — ABNORMAL LOW (ref 8.9–10.3)
Creatinine, Ser: 1.89 mg/dL — ABNORMAL HIGH (ref 0.44–1.00)
GFR calc Af Amer: 30 mL/min — ABNORMAL LOW (ref >60)
GFR calc non Af Amer: 26 mL/min — ABNORMAL LOW (ref >60)
GLUCOSE: 343 mg/dL — AB (ref 70–99)
POTASSIUM: 4.2 mmol/L (ref 3.5–5.1)
Sodium: 132 mmol/L — ABNORMAL LOW (ref 135–145)

## 2017-09-03 NOTE — Telephone Encounter (Signed)
-----  Message from Satira Sark, MD sent at 09/03/2017 12:09 PM EDT ----- Results reviewed.  Creatinine 1.89 up from 1.77 and potassium normal.  Relatively stable overall, no changes to current regimen. A copy of this test should be forwarded to Glenda Chroman, MD.

## 2017-09-04 DIAGNOSIS — E119 Type 2 diabetes mellitus without complications: Secondary | ICD-10-CM | POA: Diagnosis not present

## 2017-09-04 DIAGNOSIS — I509 Heart failure, unspecified: Secondary | ICD-10-CM | POA: Diagnosis not present

## 2017-09-04 DIAGNOSIS — I2510 Atherosclerotic heart disease of native coronary artery without angina pectoris: Secondary | ICD-10-CM | POA: Diagnosis not present

## 2017-09-04 NOTE — Telephone Encounter (Signed)
Daughter Willona informed. Copy sent to PCP.

## 2017-10-08 ENCOUNTER — Other Ambulatory Visit: Payer: Self-pay | Admitting: Adult Health

## 2017-10-09 NOTE — Telephone Encounter (Signed)
Rx request sent to pharmacy.  

## 2017-10-10 ENCOUNTER — Other Ambulatory Visit: Payer: Self-pay | Admitting: Adult Health

## 2017-10-20 ENCOUNTER — Other Ambulatory Visit: Payer: Self-pay | Admitting: Cardiology

## 2017-10-20 ENCOUNTER — Other Ambulatory Visit (HOSPITAL_COMMUNITY): Payer: Self-pay | Admitting: Student

## 2017-10-20 ENCOUNTER — Other Ambulatory Visit: Payer: Self-pay | Admitting: Adult Health

## 2017-10-22 ENCOUNTER — Other Ambulatory Visit: Payer: Self-pay | Admitting: *Deleted

## 2017-10-22 MED ORDER — TORSEMIDE 20 MG PO TABS: 80.0000 mg | ORAL_TABLET | Freq: Two times a day (BID) | ORAL | 3 refills | Status: DC

## 2017-11-19 NOTE — Progress Notes (Signed)
.    Cardiology Office Note  Date: 11/21/2017   ID: [name] [name], DOB Apr 07, 1945, MRN [id number]  PCP: Glenda Chroman, MD  Primary Cardiologist: Rozann Lesches, MD   Chief Complaint  Patient presents with  . Diastolic heart failure    History of Present Illness: [name] [name] is a 72 y.o. female last seen in August.  She is here today with her daughter for follow-up visit.  Overall, she does not report any major change in status.  Still using supplemental oxygen and sedentary.  Leg swelling has been adequately controlled and her weights have been running in the low to mid 190s at home.  He is on Demadex 80 mg twice daily with potassium supplements.  Follow-up echocardiogram in August revealed LVEF 60 to 65% with high filling pressures, reduced right ventricular contraction, and moderate pulmonary hypertension with PASP estimated 55 mmHg.  I reviewed her previous lab work from August.  Creatinine was relatively stable at 1.8 and potassium normal.  We went over her medications.  Past Medical History:  Diagnosis Date  . Arthritis   . Carotid artery disease (HCC)    a. Bilateral CEA; 50-60% bilateral ICA stenosis, 11/12  . Cellulitis    a. Recurrent, bilateral legs  . CHF (congestive heart failure) ([place])   . Chronic back pain   . Chronic diastolic heart failure ([place])   . Coronary atherosclerosis of native coronary artery    a. 11/2010 NSTEMI/CABG x 4: L-LAD; S-PL; S-OM; S-DX, by PVT.  [name] Critical lower limb ischemia   . Depression   . Essential hypertension, benign   . Herniated disc   . History of blood transfusion   . Hypothyroidism   . Ischemic cardiomyopathy    a. 11/2010 TEE: EF 40-45%.  . Migraine   . Mixed hyperlipidemia   . NSTEMI (non-ST elevated myocardial infarction) ([place]) 11/2010  . On home oxygen therapy   . PAD (peripheral artery disease) ([place])   . Pleural effusion   . Pneumonia 2000's X 2  . Stroke ([place]) 08/2009  . Suicide attempt ([place]) 08/2002  . TIA  (transient ischemic attack)   . TMJ syndrome   . Type 2 diabetes mellitus ([place])   . Venous insufficiency     Past Surgical History:  Procedure Laterality Date  . ABDOMINAL HYSTERECTOMY    . APPENDECTOMY    . CARDIAC CATHETERIZATION  11/2010  . CARDIAC CATHETERIZATION N/A 09/16/2015   Procedure: Right Heart Cath;  Surgeon: Larey Dresser, MD;  Location: [facility];  Service: Cardiovascular;  Laterality: N/A;  . CAROTID ENDARTERECTOMY Bilateral 2011   Bilateral - Dr. Kellie Simmering  . CHOLECYSTECTOMY    . COLONOSCOPY W/ BIOPSIES AND POLYPECTOMY    . CORONARY ANGIOPLASTY WITH STENT PLACEMENT  03/2013   "1"  . CORONARY ARTERY BYPASS GRAFT  11/24/2010   Procedure: CORONARY ARTERY BYPASS GRAFTING (CABG);  Surgeon: Tharon Aquas Adelene Idler, MD;  Location: [facility];  Service: Open Heart Surgery;  Laterality: N/A;  Coronary Artery Bypass Graft times four on pump utilizing left internal mammary artery and bilateral greater saphenous veins harvested endoscopically, transesophageal echocardiogram   . DEBRIDEMENT TOE Left    "nonhealing wound; 3rd digit  . DILATION AND CURETTAGE OF UTERUS    . ENDARTERECTOMY POPLITEAL Left 10/07/2013   Procedure: LEFT POPLITEAL ENDARTERECTOMY ;  Surgeon: Angelia Mould, MD;  Location: [facility];  Service: Vascular;  Laterality: Left;  . FEMORAL-POPLITEAL BYPASS GRAFT Left 10/07/2013   Procedure:  LEFT FEMORAL-POPLITEAL ARTERY BYPASS GRAFT;  Surgeon: Angelia Mould, MD;  Location: [facility];  Service: Vascular;  Laterality: Left;  . FRACTURE SURGERY    . INTRAOPERATIVE ARTERIOGRAM Left 10/07/2013   Procedure: INTRA OPERATIVE ARTERIOGRAM LEFT LEG;  Surgeon: Angelia Mould, MD;  Location: [facility];  Service: Vascular;  Laterality: Left;  . IR GENERIC HISTORICAL  09/21/2015   IR REMOVAL OF PLURAL CATH W/CUFF 09/21/2015 Marybelle Killings, MD [facility]-INTER[facility] RAD  . LEFT AND RIGHT HEART CATHETERIZATION WITH CORONARY/GRAFT ANGIOGRAM N/A 03/27/2013   Procedure: LEFT AND RIGHT HEART  CATHETERIZATION WITH Beatrix Fetters;  Surgeon: Burnell Blanks, MD;  Location: [facility] CATH LAB;  Service: Cardiovascular;  Laterality: N/A;  . LOWER EXTREMITY ANGIOGRAM  09/08/2012; 10/06/2013   "found 100% blockage; unsuccessful attempt at crossing a chronic total occlusion of the left SFA in the setting of critical limb ischemia  . LOWER EXTREMITY ANGIOGRAM Bilateral 09/08/2013   Procedure: LOWER EXTREMITY ANGIOGRAM;  Surgeon: [name], MD;  Location: [facility] CATH LAB;  Service: Cardiovascular;  Laterality: Bilateral;  . PERIPHERAL VASCULAR CATHETERIZATION  11/04/2015   Procedure: Peripheral Vascular Atherectomy;  Surgeon: [name], MD;  Location: [facility];  Service: Cardiovascular;;  RT. SFA  . PERIPHERAL VASCULAR CATHETERIZATION Bilateral 11/04/2015   Procedure: Lower Extremity Intervention;  Surgeon: [name], MD;  Location: [facility];  Service: Cardiovascular;  Laterality: Bilateral;  . PERIPHERAL VASCULAR CATHETERIZATION  11/04/2015   Procedure: Peripheral Vascular Balloon Angioplasty;  Surgeon: [name], MD;  Location: [facility];  Service: Cardiovascular;;  TP Trunk  . TOE SURGERY Left    "put pin in 2nd toe"  . TUBAL LIGATION    . WRIST FRACTURE SURGERY Left    "grafted bone from hip to wrist"    Current Outpatient Medications  Medication Sig Dispense Refill  . acetaminophen (TYLENOL) 500 MG tablet Take 500-1,000 mg by mouth every 6 (six) hours as needed for mild pain or moderate pain.     [name] amLODipine (NORVASC) 10 MG tablet TAKE 1 TABLET BY MOUTH ONCE DAILY 90 tablet 0  . aspirin 81 MG EC tablet Take 81 mg by mouth daily.    [name] atorvastatin (LIPITOR) 40 MG tablet TAKE 1 TABLET BY MOUTH ONCE DAILY AT  6  PM 90 tablet 0  . citalopram (CELEXA) 40 MG tablet Take 20 mg by mouth every morning.     . clopidogrel (PLAVIX) 75 MG tablet TAKE 1 TABLET BY MOUTH ONCE DAILY WITH BREAKFAST 90 tablet 0  . clotrimazole-betamethasone  (LOTRISONE) cream Apply 1 application topically 2 (two) times daily as needed.    Mariane Baumgarten Calcium (STOOL SOFTENER PO) Take 200 mg by mouth at bedtime as needed (for constipation).     . gabapentin (NEURONTIN) 300 MG capsule Take 300-600 mg by mouth See admin instructions. 300 mg in the morning and 600 mg prior to bed(time)    . hydrALAZINE (APRESOLINE) 25 MG tablet Take 3 tablets (75 mg total) by mouth every 8 (eight) hours. 270 tablet 3  . insulin detemir (LEVEMIR) 100 UNIT/ML injection Inject 10 Units into the skin daily with breakfast.    . isosorbide mononitrate (IMDUR) 30 MG 24 hr tablet TAKE 1 TABLET BY MOUTH ONCE DAILY 90 tablet 1  . JANUVIA 25 MG tablet TAKE 1 TABLET BY MOUTH ONCE DAILY 30 tablet 3  . levothyroxine (SYNTHROID, LEVOTHROID) 75 MCG tablet Take 1 tablet (75 mcg total) by mouth daily before breakfast.    .  Multiple Vitamin (MULTIVITAMIN WITH MINERALS) TABS tablet Take 1 tablet by mouth daily.    . nitroGLYCERIN (NITROSTAT) 0.4 MG SL tablet Place 1 tablet (0.4 mg total) under the tongue every 5 (five) minutes as needed for chest pain. Up to 3 doses. If no relief after 3rd dose, proceed to the ED for an evaluation 25 tablet 3  . nystatin (MYCOSTATIN/NYSTOP) powder Apply 1 application topically 2 (two) times daily as needed.    . potassium chloride SA (K-DUR,KLOR-CON) 20 MEQ tablet TAKE 1 TABLET BY MOUTH TWICE DAILY 180 tablet 0  . tiZANidine (ZANAFLEX) 4 MG tablet Take 4 mg by mouth 2 (two) times daily as needed for muscle spasms.    [name] torsemide (DEMADEX) 20 MG tablet Take 4 tablets (80 mg total) by mouth 2 (two) times daily. 720 tablet 3  . traMADol (ULTRAM) 50 MG tablet Take 50 mg by mouth.     No current facility-administered medications for this visit.    Allergies:  Other; Strawberry extract; Sulfa antibiotics; Coffee bean extract [coffea arabica]; and Tape   Social History: The patient  reports that she quit smoking about 8 years ago. Her smoking use included  cigarettes. She started smoking about 61 years ago. She has a 150.00 pack-year smoking history. She has never used smokeless tobacco. She reports that she drinks alcohol. She reports that she has current or past drug history. Drug: Marijuana.   ROS:  Please see the history of present illness. Otherwise, complete review of systems is positive for chronic fatigue.  All other systems are reviewed and negative.   Physical Exam: VS:  BP 122/70   Pulse (!) 53   Ht _0  (1.676 m)   Wt 195 lb 9.6 oz (88.7 kg)   SpO2 92%   BMI 31.57 kg/m , BMI Body mass index is 31.57 kg/m.  Wt Readings from Last 3 Encounters:  11/21/17 195 lb 9.6 oz (88.7 kg)  08/09/17 192 lb 9.6 oz (87.4 kg)  05/09/17 200 lb (90.7 kg)    General: Chronically ill-appearing woman, wearing oxygen via nasal cannula. HEENT: Conjunctiva and lids normal, oropharynx clear. Neck: Supple, no elevated JVP, bilateral CEA scars, no thyromegaly. Lungs: Decreased breath sounds throughout, nonlabored breathing at rest. Cardiac: Regular rate and rhythm, no S3, 2/6 systolic murmur. Abdomen: Soft, nontender, bowel sounds present. Extremities: Appearing lower leg edema which is mild at this time with venous stasis, distal pulses 1+. Skin: Warm and dry. Musculoskeletal: No kyphosis. Neuropsychiatric: Alert and oriented x3, affect grossly appropriate.  ECG: I personally reviewed the tracing from 03/31/2017 which showed sinus bradycardia.  Recent Labwork: 03/31/2017: ALT 16; AST 21; B Natriuretic Peptide 475.0; Hemoglobin 10.1; Platelets 182; TSH 3.573 09/03/2017: BUN 68; Creatinine, Ser 1.89; Potassium 4.2; Sodium 132     Component Value Date/Time   CHOL 122 11/21/2016 1510   TRIG 113 11/21/2016 1510   HDL 41 11/21/2016 1510   CHOLHDL 3.0 11/21/2016 1510   VLDL 23 11/21/2016 1510   LDLCALC 58 11/21/2016 1510    Other Studies Reviewed Today:  Echocardiogram 08/21/2017: Study Conclusions  - Left ventricle: The cavity size was  normal. Wall thickness was   increased in a pattern of moderate to severe LVH. Systolic   function was normal. The estimated ejection fraction was in the   range of 60% to 65%. Wall motion was normal; there were no   regional wall motion abnormalities. Doppler parameters are   consistent with restrictive physiology, indicative of decreased   left  ventricular diastolic compliance and/or increased left   atrial pressure. Doppler parameters are consistent with high   ventricular filling pressure. - Aortic valve: Valve area (VTI): 1.95 cm^2. Valve area (Vmax):   1.73 cm^2. Valve area (Vmean): 1.83 cm^2. - Mitral valve: Mildly calcified annulus. Mildly thickened leaflets   . - Left atrium: The atrium was severely dilated. - Right ventricle: The ventricular septum is flattend in diastole   suggesting RV volume overload. The cavity size was moderately   dilated. Systolic function was mildly reduced. - Right atrium: The atrium was mildly dilated. - Tricuspid valve: There was moderate regurgitation. The TR is   eccentric and may be underestimated - Pulmonary arteries: Systolic pressure was moderately increased.   PA peak pressure: 55 mm Hg (S). - Technically adequate study.  Assessment and Plan:  1.  Diastolic heart failure with right ventricular dysfunction and pulmonary hypertension.  She is relatively stable in terms of volume status and we will continue Demadex at 80 mg twice daily with potassium supplements.  Follow-up BMET for next visit.  2.  CKD stage III, last creatinine stable at 1.8.  3.  Multivessel CAD status post CABG and graft intervention.  She reports no active angina and we continue with both aspirin and statin therapy.  4.  Essential hypertension, blood pressure is adequately controlled today.  Current medicines were reviewed with the patient today.   Orders Placed This Encounter  Procedures  . Basic metabolic panel  . CBC    Disposition: Follow-up in 3  months.  Signed, Satira Sark, MD, [facility] 11/21/2017 2:56 PM    [facility], [place], [place] [address] Phone: [phone]; Fax: [phone]

## 2017-11-21 ENCOUNTER — Encounter: Payer: Self-pay | Admitting: Cardiology

## 2017-11-21 ENCOUNTER — Ambulatory Visit (INDEPENDENT_AMBULATORY_CARE_PROVIDER_SITE_OTHER): Payer: Medicare Other | Admitting: Cardiology

## 2017-11-21 VITALS — BP 122/70 | HR 53 | Ht 66.0 in | Wt 195.6 lb

## 2017-11-21 DIAGNOSIS — N183 Chronic kidney disease, stage 3 unspecified: Secondary | ICD-10-CM

## 2017-11-21 DIAGNOSIS — I5032 Chronic diastolic (congestive) heart failure: Secondary | ICD-10-CM

## 2017-11-21 DIAGNOSIS — Z79899 Other long term (current) drug therapy: Secondary | ICD-10-CM

## 2017-11-21 DIAGNOSIS — I2510 Atherosclerotic heart disease of native coronary artery without angina pectoris: Secondary | ICD-10-CM | POA: Diagnosis not present

## 2017-11-21 DIAGNOSIS — I10 Essential (primary) hypertension: Secondary | ICD-10-CM | POA: Diagnosis not present

## 2017-11-21 NOTE — Patient Instructions (Addendum)
Medication Instructions:   Your physician recommends that you continue on your current medications as directed. Please refer to the Current Medication list given to you today.  Labwork:  Your physician recommends that you return for lab work in: 3 months just before your next visit to check your BMET & CBC.  Testing/Procedures:  NONE  Follow-Up:  Your physician recommends that you schedule a follow-up appointment in: 3 months.  Any Other Special Instructions Will Be Listed Below (If Applicable).  If you need a refill on your cardiac medications before your next appointment, please call your pharmacy.

## 2017-12-10 ENCOUNTER — Other Ambulatory Visit (HOSPITAL_COMMUNITY): Payer: Self-pay | Admitting: Student

## 2017-12-19 DIAGNOSIS — Z6830 Body mass index (BMI) 30.0-30.9, adult: Secondary | ICD-10-CM | POA: Diagnosis not present

## 2017-12-19 DIAGNOSIS — Z1339 Encounter for screening examination for other mental health and behavioral disorders: Secondary | ICD-10-CM | POA: Diagnosis not present

## 2017-12-19 DIAGNOSIS — I272 Pulmonary hypertension, unspecified: Secondary | ICD-10-CM | POA: Diagnosis not present

## 2017-12-19 DIAGNOSIS — Z299 Encounter for prophylactic measures, unspecified: Secondary | ICD-10-CM | POA: Diagnosis not present

## 2017-12-19 DIAGNOSIS — R5383 Other fatigue: Secondary | ICD-10-CM | POA: Diagnosis not present

## 2017-12-19 DIAGNOSIS — Z0000 Encounter for general adult medical examination without abnormal findings: Secondary | ICD-10-CM | POA: Diagnosis not present

## 2017-12-19 DIAGNOSIS — E039 Hypothyroidism, unspecified: Secondary | ICD-10-CM | POA: Diagnosis not present

## 2017-12-19 DIAGNOSIS — Z7189 Other specified counseling: Secondary | ICD-10-CM | POA: Diagnosis not present

## 2017-12-19 DIAGNOSIS — E1165 Type 2 diabetes mellitus with hyperglycemia: Secondary | ICD-10-CM | POA: Diagnosis not present

## 2017-12-19 DIAGNOSIS — Z1211 Encounter for screening for malignant neoplasm of colon: Secondary | ICD-10-CM | POA: Diagnosis not present

## 2017-12-19 DIAGNOSIS — Z1331 Encounter for screening for depression: Secondary | ICD-10-CM | POA: Diagnosis not present

## 2017-12-25 DIAGNOSIS — S90852A Superficial foreign body, left foot, initial encounter: Secondary | ICD-10-CM | POA: Diagnosis not present

## 2017-12-25 DIAGNOSIS — R29898 Other symptoms and signs involving the musculoskeletal system: Secondary | ICD-10-CM | POA: Diagnosis not present

## 2017-12-25 DIAGNOSIS — S81809A Unspecified open wound, unspecified lower leg, initial encounter: Secondary | ICD-10-CM | POA: Diagnosis not present

## 2017-12-25 DIAGNOSIS — M6281 Muscle weakness (generalized): Secondary | ICD-10-CM | POA: Diagnosis not present

## 2017-12-25 DIAGNOSIS — M50922 Unspecified cervical disc disorder at C5-C6 level: Secondary | ICD-10-CM | POA: Diagnosis not present

## 2017-12-25 DIAGNOSIS — Z743 Need for continuous supervision: Secondary | ICD-10-CM | POA: Diagnosis not present

## 2017-12-25 DIAGNOSIS — Y998 Other external cause status: Secondary | ICD-10-CM | POA: Diagnosis not present

## 2017-12-25 DIAGNOSIS — I6529 Occlusion and stenosis of unspecified carotid artery: Secondary | ICD-10-CM | POA: Diagnosis not present

## 2017-12-25 DIAGNOSIS — S0300XA Dislocation of jaw, unspecified side, initial encounter: Secondary | ICD-10-CM | POA: Diagnosis not present

## 2017-12-25 DIAGNOSIS — S0083XA Contusion of other part of head, initial encounter: Secondary | ICD-10-CM | POA: Diagnosis present

## 2017-12-25 DIAGNOSIS — R001 Bradycardia, unspecified: Secondary | ICD-10-CM | POA: Diagnosis not present

## 2017-12-25 DIAGNOSIS — M79604 Pain in right leg: Secondary | ICD-10-CM | POA: Diagnosis not present

## 2017-12-25 DIAGNOSIS — I5032 Chronic diastolic (congestive) heart failure: Secondary | ICD-10-CM | POA: Diagnosis present

## 2017-12-25 DIAGNOSIS — D649 Anemia, unspecified: Secondary | ICD-10-CM | POA: Diagnosis not present

## 2017-12-25 DIAGNOSIS — I959 Hypotension, unspecified: Secondary | ICD-10-CM | POA: Diagnosis not present

## 2017-12-25 DIAGNOSIS — Z9981 Dependence on supplemental oxygen: Secondary | ICD-10-CM | POA: Diagnosis not present

## 2017-12-25 DIAGNOSIS — E878 Other disorders of electrolyte and fluid balance, not elsewhere classified: Secondary | ICD-10-CM | POA: Diagnosis not present

## 2017-12-25 DIAGNOSIS — S81801D Unspecified open wound, right lower leg, subsequent encounter: Secondary | ICD-10-CM | POA: Diagnosis not present

## 2017-12-25 DIAGNOSIS — I70402 Unspecified atherosclerosis of autologous vein bypass graft(s) of the extremities, left leg: Secondary | ICD-10-CM | POA: Diagnosis not present

## 2017-12-25 DIAGNOSIS — I517 Cardiomegaly: Secondary | ICD-10-CM | POA: Diagnosis not present

## 2017-12-25 DIAGNOSIS — L03115 Cellulitis of right lower limb: Secondary | ICD-10-CM | POA: Diagnosis present

## 2017-12-25 DIAGNOSIS — E039 Hypothyroidism, unspecified: Secondary | ICD-10-CM | POA: Diagnosis present

## 2017-12-25 DIAGNOSIS — S199XXA Unspecified injury of neck, initial encounter: Secondary | ICD-10-CM | POA: Diagnosis not present

## 2017-12-25 DIAGNOSIS — I130 Hypertensive heart and chronic kidney disease with heart failure and stage 1 through stage 4 chronic kidney disease, or unspecified chronic kidney disease: Secondary | ICD-10-CM | POA: Diagnosis present

## 2017-12-25 DIAGNOSIS — S92332A Displaced fracture of third metatarsal bone, left foot, initial encounter for closed fracture: Secondary | ICD-10-CM | POA: Diagnosis present

## 2017-12-25 DIAGNOSIS — Z7902 Long term (current) use of antithrombotics/antiplatelets: Secondary | ICD-10-CM | POA: Diagnosis not present

## 2017-12-25 DIAGNOSIS — S79912A Unspecified injury of left hip, initial encounter: Secondary | ICD-10-CM | POA: Diagnosis not present

## 2017-12-25 DIAGNOSIS — I998 Other disorder of circulatory system: Secondary | ICD-10-CM | POA: Diagnosis not present

## 2017-12-25 DIAGNOSIS — F329 Major depressive disorder, single episode, unspecified: Secondary | ICD-10-CM | POA: Diagnosis present

## 2017-12-25 DIAGNOSIS — E871 Hypo-osmolality and hyponatremia: Secondary | ICD-10-CM | POA: Diagnosis present

## 2017-12-25 DIAGNOSIS — J810 Acute pulmonary edema: Secondary | ICD-10-CM | POA: Diagnosis not present

## 2017-12-25 DIAGNOSIS — R2689 Other abnormalities of gait and mobility: Secondary | ICD-10-CM | POA: Diagnosis not present

## 2017-12-25 DIAGNOSIS — R402412 Glasgow coma scale score 13-15, at arrival to emergency department: Secondary | ICD-10-CM | POA: Diagnosis not present

## 2017-12-25 DIAGNOSIS — I5030 Unspecified diastolic (congestive) heart failure: Secondary | ICD-10-CM | POA: Diagnosis not present

## 2017-12-25 DIAGNOSIS — I779 Disorder of arteries and arterioles, unspecified: Secondary | ICD-10-CM | POA: Diagnosis not present

## 2017-12-25 DIAGNOSIS — E119 Type 2 diabetes mellitus without complications: Secondary | ICD-10-CM | POA: Diagnosis not present

## 2017-12-25 DIAGNOSIS — D509 Iron deficiency anemia, unspecified: Secondary | ICD-10-CM | POA: Diagnosis present

## 2017-12-25 DIAGNOSIS — S81801A Unspecified open wound, right lower leg, initial encounter: Secondary | ICD-10-CM | POA: Diagnosis present

## 2017-12-25 DIAGNOSIS — W06XXXA Fall from bed, initial encounter: Secondary | ICD-10-CM | POA: Diagnosis not present

## 2017-12-25 DIAGNOSIS — S81802D Unspecified open wound, left lower leg, subsequent encounter: Secondary | ICD-10-CM | POA: Diagnosis not present

## 2017-12-25 DIAGNOSIS — S92422A Displaced fracture of distal phalanx of left great toe, initial encounter for closed fracture: Secondary | ICD-10-CM | POA: Diagnosis not present

## 2017-12-25 DIAGNOSIS — L03119 Cellulitis of unspecified part of limb: Secondary | ICD-10-CM | POA: Diagnosis not present

## 2017-12-25 DIAGNOSIS — R41841 Cognitive communication deficit: Secondary | ICD-10-CM | POA: Diagnosis not present

## 2017-12-25 DIAGNOSIS — S92425D Nondisplaced fracture of distal phalanx of left great toe, subsequent encounter for fracture with routine healing: Secondary | ICD-10-CM | POA: Diagnosis not present

## 2017-12-25 DIAGNOSIS — F419 Anxiety disorder, unspecified: Secondary | ICD-10-CM | POA: Diagnosis not present

## 2017-12-25 DIAGNOSIS — N183 Chronic kidney disease, stage 3 (moderate): Secondary | ICD-10-CM | POA: Diagnosis present

## 2017-12-25 DIAGNOSIS — Z951 Presence of aortocoronary bypass graft: Secondary | ICD-10-CM | POA: Diagnosis not present

## 2017-12-25 DIAGNOSIS — S0990XA Unspecified injury of head, initial encounter: Secondary | ICD-10-CM | POA: Diagnosis not present

## 2017-12-25 DIAGNOSIS — R0902 Hypoxemia: Secondary | ICD-10-CM | POA: Diagnosis not present

## 2017-12-25 DIAGNOSIS — W268XXA Contact with other sharp object(s), not elsewhere classified, initial encounter: Secondary | ICD-10-CM | POA: Diagnosis not present

## 2017-12-25 DIAGNOSIS — S92415A Nondisplaced fracture of proximal phalanx of left great toe, initial encounter for closed fracture: Secondary | ICD-10-CM | POA: Diagnosis present

## 2017-12-25 DIAGNOSIS — E11628 Type 2 diabetes mellitus with other skin complications: Secondary | ICD-10-CM | POA: Diagnosis not present

## 2017-12-25 DIAGNOSIS — T508X5A Adverse effect of diagnostic agents, initial encounter: Secondary | ICD-10-CM | POA: Diagnosis not present

## 2017-12-25 DIAGNOSIS — Z043 Encounter for examination and observation following other accident: Secondary | ICD-10-CM | POA: Diagnosis not present

## 2017-12-25 DIAGNOSIS — S92532A Displaced fracture of distal phalanx of left lesser toe(s), initial encounter for closed fracture: Secondary | ICD-10-CM | POA: Diagnosis not present

## 2017-12-25 DIAGNOSIS — R296 Repeated falls: Secondary | ICD-10-CM | POA: Diagnosis not present

## 2017-12-25 DIAGNOSIS — I110 Hypertensive heart disease with heart failure: Secondary | ICD-10-CM | POA: Diagnosis not present

## 2017-12-25 DIAGNOSIS — Z79899 Other long term (current) drug therapy: Secondary | ICD-10-CM | POA: Diagnosis not present

## 2017-12-25 DIAGNOSIS — S80812A Abrasion, left lower leg, initial encounter: Secondary | ICD-10-CM | POA: Diagnosis not present

## 2017-12-25 DIAGNOSIS — M79605 Pain in left leg: Secondary | ICD-10-CM | POA: Diagnosis not present

## 2017-12-25 DIAGNOSIS — I7389 Other specified peripheral vascular diseases: Secondary | ICD-10-CM | POA: Diagnosis not present

## 2017-12-25 DIAGNOSIS — Z66 Do not resuscitate: Secondary | ICD-10-CM | POA: Diagnosis present

## 2017-12-25 DIAGNOSIS — R9431 Abnormal electrocardiogram [ECG] [EKG]: Secondary | ICD-10-CM | POA: Diagnosis not present

## 2017-12-25 DIAGNOSIS — W2203XA Walked into furniture, initial encounter: Secondary | ICD-10-CM | POA: Diagnosis not present

## 2017-12-25 DIAGNOSIS — M79662 Pain in left lower leg: Secondary | ICD-10-CM | POA: Diagnosis not present

## 2017-12-25 DIAGNOSIS — J322 Chronic ethmoidal sinusitis: Secondary | ICD-10-CM | POA: Diagnosis not present

## 2017-12-25 DIAGNOSIS — Z95820 Peripheral vascular angioplasty status with implants and grafts: Secondary | ICD-10-CM | POA: Diagnosis not present

## 2017-12-25 DIAGNOSIS — S12301A Unspecified nondisplaced fracture of fourth cervical vertebra, initial encounter for closed fracture: Secondary | ICD-10-CM | POA: Diagnosis not present

## 2017-12-25 DIAGNOSIS — M4316 Spondylolisthesis, lumbar region: Secondary | ICD-10-CM | POA: Diagnosis not present

## 2017-12-25 DIAGNOSIS — Z87891 Personal history of nicotine dependence: Secondary | ICD-10-CM | POA: Diagnosis not present

## 2017-12-25 DIAGNOSIS — R937 Abnormal findings on diagnostic imaging of other parts of musculoskeletal system: Secondary | ICD-10-CM | POA: Diagnosis not present

## 2017-12-25 DIAGNOSIS — M545 Low back pain: Secondary | ICD-10-CM | POA: Diagnosis not present

## 2017-12-25 DIAGNOSIS — I739 Peripheral vascular disease, unspecified: Secondary | ICD-10-CM | POA: Diagnosis not present

## 2017-12-25 DIAGNOSIS — R279 Unspecified lack of coordination: Secondary | ICD-10-CM | POA: Diagnosis not present

## 2017-12-25 DIAGNOSIS — S81802A Unspecified open wound, left lower leg, initial encounter: Secondary | ICD-10-CM | POA: Diagnosis not present

## 2017-12-25 DIAGNOSIS — Y92013 Bedroom of single-family (private) house as the place of occurrence of the external cause: Secondary | ICD-10-CM | POA: Diagnosis not present

## 2017-12-25 DIAGNOSIS — L03116 Cellulitis of left lower limb: Secondary | ICD-10-CM | POA: Diagnosis present

## 2017-12-25 DIAGNOSIS — I2510 Atherosclerotic heart disease of native coronary artery without angina pectoris: Secondary | ICD-10-CM | POA: Diagnosis present

## 2017-12-25 DIAGNOSIS — S0302XA Dislocation of jaw, left side, initial encounter: Secondary | ICD-10-CM | POA: Diagnosis not present

## 2017-12-25 DIAGNOSIS — M4312 Spondylolisthesis, cervical region: Secondary | ICD-10-CM | POA: Diagnosis not present

## 2017-12-25 DIAGNOSIS — L0390 Cellulitis, unspecified: Secondary | ICD-10-CM | POA: Diagnosis not present

## 2017-12-25 DIAGNOSIS — W19XXXA Unspecified fall, initial encounter: Secondary | ICD-10-CM | POA: Diagnosis not present

## 2017-12-25 DIAGNOSIS — N142 Nephropathy induced by unspecified drug, medicament or biological substance: Secondary | ICD-10-CM | POA: Diagnosis not present

## 2017-12-25 DIAGNOSIS — N186 End stage renal disease: Secondary | ICD-10-CM | POA: Diagnosis not present

## 2017-12-25 DIAGNOSIS — E1122 Type 2 diabetes mellitus with diabetic chronic kidney disease: Secondary | ICD-10-CM | POA: Diagnosis present

## 2017-12-25 DIAGNOSIS — T82898A Other specified complication of vascular prosthetic devices, implants and grafts, initial encounter: Secondary | ICD-10-CM | POA: Diagnosis not present

## 2017-12-25 DIAGNOSIS — R34 Anuria and oliguria: Secondary | ICD-10-CM | POA: Diagnosis not present

## 2017-12-25 DIAGNOSIS — S92425A Nondisplaced fracture of distal phalanx of left great toe, initial encounter for closed fracture: Secondary | ICD-10-CM | POA: Diagnosis not present

## 2017-12-25 DIAGNOSIS — M542 Cervicalgia: Secondary | ICD-10-CM | POA: Diagnosis not present

## 2017-12-25 DIAGNOSIS — M25552 Pain in left hip: Secondary | ICD-10-CM | POA: Diagnosis not present

## 2017-12-25 DIAGNOSIS — Z9181 History of falling: Secondary | ICD-10-CM | POA: Diagnosis not present

## 2017-12-25 DIAGNOSIS — Z7982 Long term (current) use of aspirin: Secondary | ICD-10-CM | POA: Diagnosis not present

## 2017-12-25 DIAGNOSIS — R7989 Other specified abnormal findings of blood chemistry: Secondary | ICD-10-CM | POA: Diagnosis not present

## 2017-12-25 DIAGNOSIS — N179 Acute kidney failure, unspecified: Secondary | ICD-10-CM | POA: Diagnosis not present

## 2017-12-25 DIAGNOSIS — Z794 Long term (current) use of insulin: Secondary | ICD-10-CM | POA: Diagnosis not present

## 2017-12-25 DIAGNOSIS — W1830XA Fall on same level, unspecified, initial encounter: Secondary | ICD-10-CM | POA: Diagnosis not present

## 2017-12-25 DIAGNOSIS — S80811A Abrasion, right lower leg, initial encounter: Secondary | ICD-10-CM | POA: Diagnosis not present

## 2017-12-25 DIAGNOSIS — M79672 Pain in left foot: Secondary | ICD-10-CM | POA: Diagnosis not present

## 2017-12-25 DIAGNOSIS — I509 Heart failure, unspecified: Secondary | ICD-10-CM | POA: Diagnosis not present

## 2017-12-31 DIAGNOSIS — R2689 Other abnormalities of gait and mobility: Secondary | ICD-10-CM | POA: Diagnosis not present

## 2017-12-31 DIAGNOSIS — L89152 Pressure ulcer of sacral region, stage 2: Secondary | ICD-10-CM | POA: Diagnosis present

## 2017-12-31 DIAGNOSIS — S62617A Displaced fracture of proximal phalanx of left little finger, initial encounter for closed fracture: Secondary | ICD-10-CM | POA: Diagnosis not present

## 2017-12-31 DIAGNOSIS — S14109A Unspecified injury at unspecified level of cervical spinal cord, initial encounter: Secondary | ICD-10-CM | POA: Diagnosis not present

## 2017-12-31 DIAGNOSIS — M4316 Spondylolisthesis, lumbar region: Secondary | ICD-10-CM | POA: Diagnosis not present

## 2017-12-31 DIAGNOSIS — T82898A Other specified complication of vascular prosthetic devices, implants and grafts, initial encounter: Secondary | ICD-10-CM | POA: Diagnosis not present

## 2017-12-31 DIAGNOSIS — E1142 Type 2 diabetes mellitus with diabetic polyneuropathy: Secondary | ICD-10-CM | POA: Diagnosis present

## 2017-12-31 DIAGNOSIS — L03116 Cellulitis of left lower limb: Secondary | ICD-10-CM | POA: Diagnosis not present

## 2017-12-31 DIAGNOSIS — S81802D Unspecified open wound, left lower leg, subsequent encounter: Secondary | ICD-10-CM | POA: Diagnosis not present

## 2017-12-31 DIAGNOSIS — M549 Dorsalgia, unspecified: Secondary | ICD-10-CM | POA: Diagnosis present

## 2017-12-31 DIAGNOSIS — I6523 Occlusion and stenosis of bilateral carotid arteries: Secondary | ICD-10-CM | POA: Diagnosis not present

## 2017-12-31 DIAGNOSIS — E119 Type 2 diabetes mellitus without complications: Secondary | ICD-10-CM | POA: Diagnosis not present

## 2017-12-31 DIAGNOSIS — R251 Tremor, unspecified: Secondary | ICD-10-CM | POA: Diagnosis present

## 2017-12-31 DIAGNOSIS — I739 Peripheral vascular disease, unspecified: Secondary | ICD-10-CM | POA: Diagnosis not present

## 2017-12-31 DIAGNOSIS — I130 Hypertensive heart and chronic kidney disease with heart failure and stage 1 through stage 4 chronic kidney disease, or unspecified chronic kidney disease: Secondary | ICD-10-CM | POA: Diagnosis present

## 2017-12-31 DIAGNOSIS — I779 Disorder of arteries and arterioles, unspecified: Secondary | ICD-10-CM | POA: Diagnosis not present

## 2017-12-31 DIAGNOSIS — I998 Other disorder of circulatory system: Secondary | ICD-10-CM | POA: Diagnosis not present

## 2017-12-31 DIAGNOSIS — Z9889 Other specified postprocedural states: Secondary | ICD-10-CM | POA: Diagnosis not present

## 2017-12-31 DIAGNOSIS — Z66 Do not resuscitate: Secondary | ICD-10-CM | POA: Diagnosis present

## 2017-12-31 DIAGNOSIS — E782 Mixed hyperlipidemia: Secondary | ICD-10-CM | POA: Diagnosis present

## 2017-12-31 DIAGNOSIS — R0902 Hypoxemia: Secondary | ICD-10-CM | POA: Diagnosis not present

## 2017-12-31 DIAGNOSIS — I255 Ischemic cardiomyopathy: Secondary | ICD-10-CM | POA: Diagnosis present

## 2017-12-31 DIAGNOSIS — N184 Chronic kidney disease, stage 4 (severe): Secondary | ICD-10-CM | POA: Diagnosis present

## 2017-12-31 DIAGNOSIS — D62 Acute posthemorrhagic anemia: Secondary | ICD-10-CM | POA: Diagnosis not present

## 2017-12-31 DIAGNOSIS — Z8639 Personal history of other endocrine, nutritional and metabolic disease: Secondary | ICD-10-CM | POA: Diagnosis not present

## 2017-12-31 DIAGNOSIS — I959 Hypotension, unspecified: Secondary | ICD-10-CM | POA: Diagnosis not present

## 2017-12-31 DIAGNOSIS — Z95828 Presence of other vascular implants and grafts: Secondary | ICD-10-CM | POA: Diagnosis not present

## 2017-12-31 DIAGNOSIS — S12301A Unspecified nondisplaced fracture of fourth cervical vertebra, initial encounter for closed fracture: Secondary | ICD-10-CM | POA: Diagnosis not present

## 2017-12-31 DIAGNOSIS — R41841 Cognitive communication deficit: Secondary | ICD-10-CM | POA: Diagnosis not present

## 2017-12-31 DIAGNOSIS — M4312 Spondylolisthesis, cervical region: Secondary | ICD-10-CM | POA: Diagnosis not present

## 2017-12-31 DIAGNOSIS — I70402 Unspecified atherosclerosis of autologous vein bypass graft(s) of the extremities, left leg: Secondary | ICD-10-CM | POA: Diagnosis not present

## 2017-12-31 DIAGNOSIS — I509 Heart failure, unspecified: Secondary | ICD-10-CM | POA: Diagnosis not present

## 2017-12-31 DIAGNOSIS — I252 Old myocardial infarction: Secondary | ICD-10-CM | POA: Diagnosis not present

## 2017-12-31 DIAGNOSIS — N186 End stage renal disease: Secondary | ICD-10-CM | POA: Diagnosis not present

## 2017-12-31 DIAGNOSIS — N179 Acute kidney failure, unspecified: Secondary | ICD-10-CM | POA: Diagnosis present

## 2017-12-31 DIAGNOSIS — N142 Nephropathy induced by unspecified drug, medicament or biological substance: Secondary | ICD-10-CM | POA: Diagnosis not present

## 2017-12-31 DIAGNOSIS — R0689 Other abnormalities of breathing: Secondary | ICD-10-CM | POA: Diagnosis not present

## 2017-12-31 DIAGNOSIS — D509 Iron deficiency anemia, unspecified: Secondary | ICD-10-CM | POA: Diagnosis not present

## 2017-12-31 DIAGNOSIS — S80812A Abrasion, left lower leg, initial encounter: Secondary | ICD-10-CM | POA: Diagnosis not present

## 2017-12-31 DIAGNOSIS — E11622 Type 2 diabetes mellitus with other skin ulcer: Secondary | ICD-10-CM | POA: Diagnosis present

## 2017-12-31 DIAGNOSIS — S81801D Unspecified open wound, right lower leg, subsequent encounter: Secondary | ICD-10-CM | POA: Diagnosis not present

## 2017-12-31 DIAGNOSIS — T508X5A Adverse effect of diagnostic agents, initial encounter: Secondary | ICD-10-CM | POA: Diagnosis not present

## 2017-12-31 DIAGNOSIS — E1165 Type 2 diabetes mellitus with hyperglycemia: Secondary | ICD-10-CM | POA: Diagnosis present

## 2017-12-31 DIAGNOSIS — S0083XA Contusion of other part of head, initial encounter: Secondary | ICD-10-CM | POA: Diagnosis not present

## 2017-12-31 DIAGNOSIS — E039 Hypothyroidism, unspecified: Secondary | ICD-10-CM | POA: Diagnosis present

## 2017-12-31 DIAGNOSIS — E1152 Type 2 diabetes mellitus with diabetic peripheral angiopathy with gangrene: Secondary | ICD-10-CM | POA: Diagnosis present

## 2017-12-31 DIAGNOSIS — Z959 Presence of cardiac and vascular implant and graft, unspecified: Secondary | ICD-10-CM | POA: Diagnosis not present

## 2017-12-31 DIAGNOSIS — Z9851 Tubal ligation status: Secondary | ICD-10-CM | POA: Diagnosis not present

## 2017-12-31 DIAGNOSIS — E871 Hypo-osmolality and hyponatremia: Secondary | ICD-10-CM | POA: Diagnosis not present

## 2017-12-31 DIAGNOSIS — R079 Chest pain, unspecified: Secondary | ICD-10-CM | POA: Diagnosis not present

## 2017-12-31 DIAGNOSIS — N183 Chronic kidney disease, stage 3 (moderate): Secondary | ICD-10-CM | POA: Diagnosis not present

## 2017-12-31 DIAGNOSIS — M1990 Unspecified osteoarthritis, unspecified site: Secondary | ICD-10-CM | POA: Diagnosis present

## 2017-12-31 DIAGNOSIS — Z9181 History of falling: Secondary | ICD-10-CM | POA: Diagnosis not present

## 2017-12-31 DIAGNOSIS — S92415A Nondisplaced fracture of proximal phalanx of left great toe, initial encounter for closed fracture: Secondary | ICD-10-CM | POA: Diagnosis not present

## 2017-12-31 DIAGNOSIS — R001 Bradycardia, unspecified: Secondary | ICD-10-CM | POA: Diagnosis not present

## 2017-12-31 DIAGNOSIS — W19XXXA Unspecified fall, initial encounter: Secondary | ICD-10-CM | POA: Diagnosis not present

## 2017-12-31 DIAGNOSIS — M50922 Unspecified cervical disc disorder at C5-C6 level: Secondary | ICD-10-CM | POA: Diagnosis not present

## 2017-12-31 DIAGNOSIS — E1122 Type 2 diabetes mellitus with diabetic chronic kidney disease: Secondary | ICD-10-CM | POA: Diagnosis present

## 2017-12-31 DIAGNOSIS — Z9981 Dependence on supplemental oxygen: Secondary | ICD-10-CM | POA: Diagnosis not present

## 2017-12-31 DIAGNOSIS — R279 Unspecified lack of coordination: Secondary | ICD-10-CM | POA: Diagnosis not present

## 2017-12-31 DIAGNOSIS — I96 Gangrene, not elsewhere classified: Secondary | ICD-10-CM | POA: Diagnosis not present

## 2017-12-31 DIAGNOSIS — L03119 Cellulitis of unspecified part of limb: Secondary | ICD-10-CM | POA: Diagnosis not present

## 2017-12-31 DIAGNOSIS — W06XXXA Fall from bed, initial encounter: Secondary | ICD-10-CM | POA: Diagnosis not present

## 2017-12-31 DIAGNOSIS — I5030 Unspecified diastolic (congestive) heart failure: Secondary | ICD-10-CM | POA: Diagnosis not present

## 2017-12-31 DIAGNOSIS — I5032 Chronic diastolic (congestive) heart failure: Secondary | ICD-10-CM | POA: Diagnosis present

## 2017-12-31 DIAGNOSIS — R109 Unspecified abdominal pain: Secondary | ICD-10-CM | POA: Diagnosis not present

## 2017-12-31 DIAGNOSIS — E878 Other disorders of electrolyte and fluid balance, not elsewhere classified: Secondary | ICD-10-CM | POA: Diagnosis not present

## 2017-12-31 DIAGNOSIS — L97529 Non-pressure chronic ulcer of other part of left foot with unspecified severity: Secondary | ICD-10-CM | POA: Diagnosis present

## 2017-12-31 DIAGNOSIS — M6281 Muscle weakness (generalized): Secondary | ICD-10-CM | POA: Diagnosis not present

## 2017-12-31 DIAGNOSIS — S92425D Nondisplaced fracture of distal phalanx of left great toe, subsequent encounter for fracture with routine healing: Secondary | ICD-10-CM | POA: Diagnosis not present

## 2017-12-31 DIAGNOSIS — G8929 Other chronic pain: Secondary | ICD-10-CM | POA: Diagnosis present

## 2017-12-31 DIAGNOSIS — Z743 Need for continuous supervision: Secondary | ICD-10-CM | POA: Diagnosis not present

## 2017-12-31 DIAGNOSIS — I2510 Atherosclerotic heart disease of native coronary artery without angina pectoris: Secondary | ICD-10-CM | POA: Diagnosis present

## 2017-12-31 DIAGNOSIS — L03115 Cellulitis of right lower limb: Secondary | ICD-10-CM | POA: Diagnosis not present

## 2018-01-03 DIAGNOSIS — S14109A Unspecified injury at unspecified level of cervical spinal cord, initial encounter: Secondary | ICD-10-CM | POA: Diagnosis not present

## 2018-01-03 DIAGNOSIS — S92415A Nondisplaced fracture of proximal phalanx of left great toe, initial encounter for closed fracture: Secondary | ICD-10-CM | POA: Diagnosis not present

## 2018-01-03 DIAGNOSIS — Z8639 Personal history of other endocrine, nutritional and metabolic disease: Secondary | ICD-10-CM | POA: Diagnosis not present

## 2018-01-03 DIAGNOSIS — S80812A Abrasion, left lower leg, initial encounter: Secondary | ICD-10-CM | POA: Diagnosis not present

## 2018-01-16 DIAGNOSIS — I509 Heart failure, unspecified: Secondary | ICD-10-CM | POA: Diagnosis not present

## 2018-01-16 DIAGNOSIS — I2510 Atherosclerotic heart disease of native coronary artery without angina pectoris: Secondary | ICD-10-CM | POA: Diagnosis not present

## 2018-01-16 DIAGNOSIS — E119 Type 2 diabetes mellitus without complications: Secondary | ICD-10-CM | POA: Diagnosis not present

## 2018-01-18 ENCOUNTER — Other Ambulatory Visit: Payer: Self-pay

## 2018-01-18 DIAGNOSIS — I779 Disorder of arteries and arterioles, unspecified: Secondary | ICD-10-CM

## 2018-01-18 DIAGNOSIS — I96 Gangrene, not elsewhere classified: Secondary | ICD-10-CM

## 2018-01-18 DIAGNOSIS — Z95828 Presence of other vascular implants and grafts: Secondary | ICD-10-CM

## 2018-01-21 ENCOUNTER — Encounter (HOSPITAL_COMMUNITY): Payer: Medicare Other

## 2018-01-21 ENCOUNTER — Encounter: Payer: Self-pay | Admitting: Family

## 2018-01-21 ENCOUNTER — Ambulatory Visit: Payer: Medicare Other | Admitting: Family

## 2018-01-25 ENCOUNTER — Other Ambulatory Visit: Payer: Self-pay

## 2018-01-25 ENCOUNTER — Encounter: Payer: Self-pay | Admitting: Family

## 2018-01-25 ENCOUNTER — Inpatient Hospital Stay (HOSPITAL_COMMUNITY)
Admission: EM | Admit: 2018-01-25 | Discharge: 2018-02-01 | DRG: 240 | Disposition: A | Discharge status: Skilled Nursing Facility | Payer: Medicare Other | Attending: Internal Medicine | Admitting: Internal Medicine

## 2018-01-25 ENCOUNTER — Emergency Department (HOSPITAL_COMMUNITY): Payer: Medicare Other

## 2018-01-25 ENCOUNTER — Ambulatory Visit (INDEPENDENT_AMBULATORY_CARE_PROVIDER_SITE_OTHER)
Admission: RE | Admit: 2018-01-25 | Discharge: 2018-01-25 | Disposition: A | Discharge status: Home / Self Care | Payer: Medicare Other | Source: Ambulatory Visit | Attending: Family | Admitting: Family

## 2018-01-25 ENCOUNTER — Encounter (HOSPITAL_COMMUNITY): Payer: Self-pay

## 2018-01-25 ENCOUNTER — Ambulatory Visit (INDEPENDENT_AMBULATORY_CARE_PROVIDER_SITE_OTHER): Payer: Medicare Other | Admitting: Family

## 2018-01-25 VITALS — BP 89/56 | HR 46 | Temp 97.0°F | Resp 20 | Ht 66.0 in | Wt 195.0 lb

## 2018-01-25 DIAGNOSIS — Z7982 Long term (current) use of aspirin: Secondary | ICD-10-CM

## 2018-01-25 DIAGNOSIS — L97529 Non-pressure chronic ulcer of other part of left foot with unspecified severity: Secondary | ICD-10-CM | POA: Diagnosis present

## 2018-01-25 DIAGNOSIS — N179 Acute kidney failure, unspecified: Secondary | ICD-10-CM | POA: Diagnosis present

## 2018-01-25 DIAGNOSIS — Z801 Family history of malignant neoplasm of trachea, bronchus and lung: Secondary | ICD-10-CM

## 2018-01-25 DIAGNOSIS — I779 Disorder of arteries and arterioles, unspecified: Secondary | ICD-10-CM | POA: Diagnosis present

## 2018-01-25 DIAGNOSIS — I6523 Occlusion and stenosis of bilateral carotid arteries: Secondary | ICD-10-CM | POA: Diagnosis not present

## 2018-01-25 DIAGNOSIS — R251 Tremor, unspecified: Secondary | ICD-10-CM | POA: Diagnosis present

## 2018-01-25 DIAGNOSIS — L89152 Pressure ulcer of sacral region, stage 2: Secondary | ICD-10-CM | POA: Diagnosis present

## 2018-01-25 DIAGNOSIS — R0902 Hypoxemia: Secondary | ICD-10-CM | POA: Diagnosis not present

## 2018-01-25 DIAGNOSIS — G8929 Other chronic pain: Secondary | ICD-10-CM | POA: Diagnosis present

## 2018-01-25 DIAGNOSIS — I255 Ischemic cardiomyopathy: Secondary | ICD-10-CM | POA: Diagnosis present

## 2018-01-25 DIAGNOSIS — M549 Dorsalgia, unspecified: Secondary | ICD-10-CM | POA: Diagnosis present

## 2018-01-25 DIAGNOSIS — Z8701 Personal history of pneumonia (recurrent): Secondary | ICD-10-CM

## 2018-01-25 DIAGNOSIS — M2550 Pain in unspecified joint: Secondary | ICD-10-CM | POA: Diagnosis not present

## 2018-01-25 DIAGNOSIS — I2510 Atherosclerotic heart disease of native coronary artery without angina pectoris: Secondary | ICD-10-CM | POA: Diagnosis present

## 2018-01-25 DIAGNOSIS — I5042 Chronic combined systolic (congestive) and diastolic (congestive) heart failure: Secondary | ICD-10-CM | POA: Diagnosis not present

## 2018-01-25 DIAGNOSIS — T82898A Other specified complication of vascular prosthetic devices, implants and grafts, initial encounter: Secondary | ICD-10-CM

## 2018-01-25 DIAGNOSIS — R109 Unspecified abdominal pain: Secondary | ICD-10-CM | POA: Diagnosis not present

## 2018-01-25 DIAGNOSIS — E039 Hypothyroidism, unspecified: Secondary | ICD-10-CM | POA: Diagnosis present

## 2018-01-25 DIAGNOSIS — M1990 Unspecified osteoarthritis, unspecified site: Secondary | ICD-10-CM | POA: Diagnosis present

## 2018-01-25 DIAGNOSIS — Z9851 Tubal ligation status: Secondary | ICD-10-CM | POA: Diagnosis not present

## 2018-01-25 DIAGNOSIS — I739 Peripheral vascular disease, unspecified: Secondary | ICD-10-CM

## 2018-01-25 DIAGNOSIS — S0083XA Contusion of other part of head, initial encounter: Secondary | ICD-10-CM | POA: Diagnosis not present

## 2018-01-25 DIAGNOSIS — M545 Low back pain: Secondary | ICD-10-CM | POA: Diagnosis not present

## 2018-01-25 DIAGNOSIS — I130 Hypertensive heart and chronic kidney disease with heart failure and stage 1 through stage 4 chronic kidney disease, or unspecified chronic kidney disease: Secondary | ICD-10-CM | POA: Diagnosis present

## 2018-01-25 DIAGNOSIS — I959 Hypotension, unspecified: Secondary | ICD-10-CM

## 2018-01-25 DIAGNOSIS — Z9981 Dependence on supplemental oxygen: Secondary | ICD-10-CM

## 2018-01-25 DIAGNOSIS — S12301A Unspecified nondisplaced fracture of fourth cervical vertebra, initial encounter for closed fracture: Secondary | ICD-10-CM | POA: Diagnosis not present

## 2018-01-25 DIAGNOSIS — Z8249 Family history of ischemic heart disease and other diseases of the circulatory system: Secondary | ICD-10-CM

## 2018-01-25 DIAGNOSIS — I96 Gangrene, not elsewhere classified: Secondary | ICD-10-CM | POA: Diagnosis not present

## 2018-01-25 DIAGNOSIS — Z66 Do not resuscitate: Secondary | ICD-10-CM | POA: Diagnosis present

## 2018-01-25 DIAGNOSIS — I998 Other disorder of circulatory system: Secondary | ICD-10-CM | POA: Diagnosis present

## 2018-01-25 DIAGNOSIS — Z882 Allergy status to sulfonamides status: Secondary | ICD-10-CM

## 2018-01-25 DIAGNOSIS — Z9889 Other specified postprocedural states: Secondary | ICD-10-CM | POA: Diagnosis not present

## 2018-01-25 DIAGNOSIS — I252 Old myocardial infarction: Secondary | ICD-10-CM

## 2018-01-25 DIAGNOSIS — D62 Acute posthemorrhagic anemia: Secondary | ICD-10-CM | POA: Diagnosis not present

## 2018-01-25 DIAGNOSIS — Z8673 Personal history of transient ischemic attack (TIA), and cerebral infarction without residual deficits: Secondary | ICD-10-CM

## 2018-01-25 DIAGNOSIS — Z79890 Hormone replacement therapy (postmenopausal): Secondary | ICD-10-CM

## 2018-01-25 DIAGNOSIS — M79605 Pain in left leg: Secondary | ICD-10-CM | POA: Diagnosis not present

## 2018-01-25 DIAGNOSIS — Z91048 Other nonmedicinal substance allergy status: Secondary | ICD-10-CM

## 2018-01-25 DIAGNOSIS — Z91018 Allergy to other foods: Secondary | ICD-10-CM

## 2018-01-25 DIAGNOSIS — Z89612 Acquired absence of left leg above knee: Secondary | ICD-10-CM | POA: Diagnosis not present

## 2018-01-25 DIAGNOSIS — E11622 Type 2 diabetes mellitus with other skin ulcer: Secondary | ICD-10-CM | POA: Diagnosis present

## 2018-01-25 DIAGNOSIS — E782 Mixed hyperlipidemia: Secondary | ICD-10-CM | POA: Diagnosis present

## 2018-01-25 DIAGNOSIS — E1142 Type 2 diabetes mellitus with diabetic polyneuropathy: Secondary | ICD-10-CM | POA: Diagnosis present

## 2018-01-25 DIAGNOSIS — E1149 Type 2 diabetes mellitus with other diabetic neurological complication: Secondary | ICD-10-CM | POA: Diagnosis not present

## 2018-01-25 DIAGNOSIS — Z955 Presence of coronary angioplasty implant and graft: Secondary | ICD-10-CM

## 2018-01-25 DIAGNOSIS — M6281 Muscle weakness (generalized): Secondary | ICD-10-CM | POA: Diagnosis not present

## 2018-01-25 DIAGNOSIS — N184 Chronic kidney disease, stage 4 (severe): Secondary | ICD-10-CM | POA: Diagnosis present

## 2018-01-25 DIAGNOSIS — R079 Chest pain, unspecified: Secondary | ICD-10-CM | POA: Diagnosis not present

## 2018-01-25 DIAGNOSIS — E1152 Type 2 diabetes mellitus with diabetic peripheral angiopathy with gangrene: Principal | ICD-10-CM | POA: Diagnosis present

## 2018-01-25 DIAGNOSIS — E1122 Type 2 diabetes mellitus with diabetic chronic kidney disease: Secondary | ICD-10-CM | POA: Diagnosis present

## 2018-01-25 DIAGNOSIS — G8918 Other acute postprocedural pain: Secondary | ICD-10-CM | POA: Diagnosis not present

## 2018-01-25 DIAGNOSIS — Z959 Presence of cardiac and vascular implant and graft, unspecified: Secondary | ICD-10-CM | POA: Diagnosis not present

## 2018-01-25 DIAGNOSIS — S62617A Displaced fracture of proximal phalanx of left little finger, initial encounter for closed fracture: Secondary | ICD-10-CM | POA: Diagnosis not present

## 2018-01-25 DIAGNOSIS — Z90710 Acquired absence of both cervix and uterus: Secondary | ICD-10-CM

## 2018-01-25 DIAGNOSIS — Z95828 Presence of other vascular implants and grafts: Secondary | ICD-10-CM | POA: Diagnosis not present

## 2018-01-25 DIAGNOSIS — Z7902 Long term (current) use of antithrombotics/antiplatelets: Secondary | ICD-10-CM

## 2018-01-25 DIAGNOSIS — R0689 Other abnormalities of breathing: Secondary | ICD-10-CM | POA: Diagnosis not present

## 2018-01-25 DIAGNOSIS — Z79899 Other long term (current) drug therapy: Secondary | ICD-10-CM

## 2018-01-25 DIAGNOSIS — Z951 Presence of aortocoronary bypass graft: Secondary | ICD-10-CM

## 2018-01-25 DIAGNOSIS — E1165 Type 2 diabetes mellitus with hyperglycemia: Secondary | ICD-10-CM

## 2018-01-25 DIAGNOSIS — I110 Hypertensive heart disease with heart failure: Secondary | ICD-10-CM | POA: Diagnosis not present

## 2018-01-25 DIAGNOSIS — I5032 Chronic diastolic (congestive) heart failure: Secondary | ICD-10-CM | POA: Diagnosis present

## 2018-01-25 DIAGNOSIS — R001 Bradycardia, unspecified: Secondary | ICD-10-CM | POA: Diagnosis not present

## 2018-01-25 DIAGNOSIS — I70229 Atherosclerosis of native arteries of extremities with rest pain, unspecified extremity: Secondary | ICD-10-CM | POA: Diagnosis present

## 2018-01-25 DIAGNOSIS — Z833 Family history of diabetes mellitus: Secondary | ICD-10-CM

## 2018-01-25 DIAGNOSIS — L03119 Cellulitis of unspecified part of limb: Secondary | ICD-10-CM | POA: Diagnosis not present

## 2018-01-25 DIAGNOSIS — F339 Major depressive disorder, recurrent, unspecified: Secondary | ICD-10-CM | POA: Diagnosis not present

## 2018-01-25 DIAGNOSIS — E1151 Type 2 diabetes mellitus with diabetic peripheral angiopathy without gangrene: Secondary | ICD-10-CM | POA: Diagnosis not present

## 2018-01-25 DIAGNOSIS — Z4781 Encounter for orthopedic aftercare following surgical amputation: Secondary | ICD-10-CM | POA: Diagnosis not present

## 2018-01-25 DIAGNOSIS — I70262 Atherosclerosis of native arteries of extremities with gangrene, left leg: Secondary | ICD-10-CM | POA: Diagnosis not present

## 2018-01-25 DIAGNOSIS — Z87891 Personal history of nicotine dependence: Secondary | ICD-10-CM

## 2018-01-25 DIAGNOSIS — Z741 Need for assistance with personal care: Secondary | ICD-10-CM | POA: Diagnosis not present

## 2018-01-25 DIAGNOSIS — Z9049 Acquired absence of other specified parts of digestive tract: Secondary | ICD-10-CM

## 2018-01-25 DIAGNOSIS — Z794 Long term (current) use of insulin: Secondary | ICD-10-CM

## 2018-01-25 DIAGNOSIS — Z8349 Family history of other endocrine, nutritional and metabolic diseases: Secondary | ICD-10-CM

## 2018-01-25 DIAGNOSIS — Z7401 Bed confinement status: Secondary | ICD-10-CM | POA: Diagnosis not present

## 2018-01-25 LAB — CBC WITH DIFFERENTIAL/PLATELET
Abs Immature Granulocytes: 0.06 10*3/uL (ref 0.00–0.07)
Basophils Absolute: 0 10*3/uL (ref 0.0–0.1)
Basophils Relative: 0 %
EOS PCT: 1 %
Eosinophils Absolute: 0.1 10*3/uL (ref 0.0–0.5)
HCT: 36.3 % (ref 36.0–46.0)
Hemoglobin: 10.6 g/dL — ABNORMAL LOW (ref 12.0–15.0)
Immature Granulocytes: 1 %
Lymphocytes Relative: 8 %
Lymphs Abs: 0.8 10*3/uL (ref 0.7–4.0)
MCH: 24.8 pg — AB (ref 26.0–34.0)
MCHC: 29.2 g/dL — ABNORMAL LOW (ref 30.0–36.0)
MCV: 85 fL (ref 80.0–100.0)
MONO ABS: 0.6 10*3/uL (ref 0.1–1.0)
Monocytes Relative: 6 %
Neutro Abs: 9 10*3/uL — ABNORMAL HIGH (ref 1.7–7.7)
Neutrophils Relative %: 84 %
Platelets: 275 10*3/uL (ref 150–400)
RBC: 4.27 MIL/uL (ref 3.87–5.11)
RDW: 19.7 % — ABNORMAL HIGH (ref 11.5–15.5)
WBC: 10.6 10*3/uL — AB (ref 4.0–10.5)
nRBC: 0.3 % — ABNORMAL HIGH (ref 0.0–0.2)

## 2018-01-25 LAB — COMPREHENSIVE METABOLIC PANEL
ALT: 19 U/L (ref 0–44)
AST: 27 U/L (ref 15–41)
Albumin: 2.9 g/dL — ABNORMAL LOW (ref 3.5–5.0)
Alkaline Phosphatase: 83 U/L (ref 38–126)
Anion gap: 12 (ref 5–15)
BUN: 44 mg/dL — AB (ref 8–23)
CO2: 25 mmol/L (ref 22–32)
Calcium: 8.8 mg/dL — ABNORMAL LOW (ref 8.9–10.3)
Chloride: 99 mmol/L (ref 98–111)
Creatinine, Ser: 2.02 mg/dL — ABNORMAL HIGH (ref 0.44–1.00)
GFR calc Af Amer: 28 mL/min — ABNORMAL LOW (ref >60)
GFR, EST NON AFRICAN AMERICAN: 24 mL/min — AB (ref >60)
Glucose, Bld: 185 mg/dL — ABNORMAL HIGH (ref 70–99)
POTASSIUM: 4.6 mmol/L (ref 3.5–5.1)
Sodium: 136 mmol/L (ref 135–145)
Total Bilirubin: 0.5 mg/dL (ref 0.3–1.2)
Total Protein: 8 g/dL (ref 6.5–8.1)

## 2018-01-25 LAB — URINALYSIS, ROUTINE W REFLEX MICROSCOPIC
Bilirubin Urine: NEGATIVE
Glucose, UA: NEGATIVE mg/dL
KETONES UR: NEGATIVE mg/dL
Leukocytes, UA: NEGATIVE
Nitrite: NEGATIVE
Protein, ur: NEGATIVE mg/dL
Specific Gravity, Urine: 1.012 (ref 1.005–1.030)
pH: 5 (ref 5.0–8.0)

## 2018-01-25 LAB — GLUCOSE, CAPILLARY: Glucose-Capillary: 130 mg/dL — ABNORMAL HIGH (ref 70–99)

## 2018-01-25 LAB — TROPONIN I: Troponin I: <0.03 ng/mL (ref <0.03)

## 2018-01-25 LAB — I-STAT CG4 LACTIC ACID, ED
LACTIC ACID, VENOUS: 1.67 mmol/L (ref 0.5–1.9)
Lactic Acid, Venous: <0.3 mmol/L — ABNORMAL LOW (ref 0.5–1.9)

## 2018-01-25 LAB — LIPASE, BLOOD: Lipase: 31 U/L (ref 11–51)

## 2018-01-25 LAB — CK: Total CK: 51 U/L (ref 38–234)

## 2018-01-25 MED ORDER — SODIUM CHLORIDE 0.9% FLUSH
3.0000 mL | Freq: Two times a day (BID) | INTRAVENOUS | Status: DC
  Administered 2018-01-26 – 2018-01-28 (×6): 3 mL via INTRAVENOUS

## 2018-01-25 MED ORDER — NITROGLYCERIN 0.4 MG SL SUBL
0.4000 mg | SUBLINGUAL_TABLET | SUBLINGUAL | Status: DC | PRN
  Administered 2018-01-28: 0.4 mg via SUBLINGUAL
  Filled 2018-01-25: qty 1

## 2018-01-25 MED ORDER — VANCOMYCIN HCL 10 G IV SOLR
1500.0000 mg | Freq: Once | INTRAVENOUS | Status: AC
  Administered 2018-01-25: 1500 mg via INTRAVENOUS
  Filled 2018-01-25: qty 1500

## 2018-01-25 MED ORDER — MORPHINE SULFATE (PF) 2 MG/ML IV SOLN: 2.0000 mg | INTRAVENOUS | Status: DC | PRN

## 2018-01-25 MED ORDER — VANCOMYCIN HCL IN DEXTROSE 1-5 GM/200ML-% IV SOLN: 1000.0000 mg | Freq: Once | INTRAVENOUS | Status: DC

## 2018-01-25 MED ORDER — VANCOMYCIN HCL IN DEXTROSE 1-5 GM/200ML-% IV SOLN: 1000.0000 mg | INTRAVENOUS | Status: DC

## 2018-01-25 MED ORDER — SODIUM CHLORIDE 0.9 % IV SOLN
1.0000 g | INTRAVENOUS | Status: DC
  Administered 2018-01-26: 1 g via INTRAVENOUS
  Filled 2018-01-25 (×2): qty 1

## 2018-01-25 MED ORDER — SODIUM CHLORIDE 0.9 % IV BOLUS (SEPSIS)
1000.0000 mL | Freq: Once | INTRAVENOUS | Status: AC
  Administered 2018-01-25: 1000 mL via INTRAVENOUS

## 2018-01-25 MED ORDER — SODIUM CHLORIDE 0.9% FLUSH: 3.0000 mL | INTRAVENOUS | Status: DC | PRN

## 2018-01-25 MED ORDER — ISOSORBIDE MONONITRATE ER 30 MG PO TB24
30.0000 mg | ORAL_TABLET | Freq: Every day | ORAL | Status: DC
  Administered 2018-01-26 – 2018-02-01 (×6): 30 mg via ORAL
  Filled 2018-01-25 (×6): qty 1

## 2018-01-25 MED ORDER — TIZANIDINE HCL 4 MG PO TABS
2.0000 mg | ORAL_TABLET | Freq: Three times a day (TID) | ORAL | Status: DC | PRN
  Administered 2018-01-30 – 2018-01-31 (×2): 2 mg via ORAL
  Filled 2018-01-25 (×2): qty 1

## 2018-01-25 MED ORDER — ONDANSETRON HCL 4 MG/2ML IJ SOLN: 4.0000 mg | Freq: Four times a day (QID) | INTRAMUSCULAR | Status: DC | PRN

## 2018-01-25 MED ORDER — METRONIDAZOLE IN NACL 5-0.79 MG/ML-% IV SOLN
500.0000 mg | Freq: Three times a day (TID) | INTRAVENOUS | Status: DC
  Administered 2018-01-25 – 2018-01-27 (×6): 500 mg via INTRAVENOUS
  Filled 2018-01-25 (×6): qty 100

## 2018-01-25 MED ORDER — POLYETHYLENE GLYCOL 3350 17 G PO PACK
17.0000 g | PACK | Freq: Every day | ORAL | Status: DC | PRN
  Administered 2018-02-01: 17 g via ORAL
  Filled 2018-01-25: qty 1

## 2018-01-25 MED ORDER — ATORVASTATIN CALCIUM 40 MG PO TABS
40.0000 mg | ORAL_TABLET | Freq: Every day | ORAL | Status: DC
  Administered 2018-01-26 – 2018-01-31 (×6): 40 mg via ORAL
  Filled 2018-01-25 (×6): qty 1

## 2018-01-25 MED ORDER — LEVOTHYROXINE SODIUM 75 MCG PO TABS
75.0000 ug | ORAL_TABLET | Freq: Every day | ORAL | Status: DC
  Administered 2018-01-26 – 2018-02-01 (×7): 75 ug via ORAL
  Filled 2018-01-25 (×7): qty 1

## 2018-01-25 MED ORDER — ACETAMINOPHEN 325 MG PO TABS
650.0000 mg | ORAL_TABLET | Freq: Four times a day (QID) | ORAL | Status: DC | PRN
  Administered 2018-01-26: 650 mg via ORAL
  Filled 2018-01-25: qty 2

## 2018-01-25 MED ORDER — ACETAMINOPHEN 650 MG RE SUPP: 650.0000 mg | Freq: Four times a day (QID) | RECTAL | Status: DC | PRN

## 2018-01-25 MED ORDER — SODIUM CHLORIDE 0.9% FLUSH
3.0000 mL | Freq: Two times a day (BID) | INTRAVENOUS | Status: DC
  Administered 2018-01-26 – 2018-01-30 (×7): 3 mL via INTRAVENOUS

## 2018-01-25 MED ORDER — SODIUM CHLORIDE 0.9 % IV SOLN
1000.0000 mL | INTRAVENOUS | Status: DC
  Administered 2018-01-25: 1000 mL via INTRAVENOUS

## 2018-01-25 MED ORDER — GABAPENTIN 100 MG PO CAPS
200.0000 mg | ORAL_CAPSULE | Freq: Two times a day (BID) | ORAL | Status: DC
  Administered 2018-01-25 – 2018-02-01 (×13): 200 mg via ORAL
  Filled 2018-01-25 (×13): qty 2

## 2018-01-25 MED ORDER — FENTANYL CITRATE (PF) 100 MCG/2ML IJ SOLN
25.0000 ug | INTRAMUSCULAR | Status: DC | PRN
  Administered 2018-01-25: 25 ug via INTRAVENOUS
  Administered 2018-01-26: 50 ug via INTRAVENOUS
  Administered 2018-01-26: 25 ug via INTRAVENOUS
  Administered 2018-01-26 (×2): 50 ug via INTRAVENOUS
  Administered 2018-01-26 – 2018-01-27 (×6): 25 ug via INTRAVENOUS
  Administered 2018-01-27: 50 ug via INTRAVENOUS
  Administered 2018-01-28 (×2): 25 ug via INTRAVENOUS
  Administered 2018-01-30 – 2018-01-31 (×6): 50 ug via INTRAVENOUS
  Filled 2018-01-25 (×20): qty 2

## 2018-01-25 MED ORDER — INSULIN ASPART 100 UNIT/ML ~~LOC~~ SOLN
0.0000 [IU] | SUBCUTANEOUS | Status: DC
  Administered 2018-01-25: 1 [IU] via SUBCUTANEOUS
  Administered 2018-01-26: 2 [IU] via SUBCUTANEOUS
  Administered 2018-01-26: 1 [IU] via SUBCUTANEOUS
  Administered 2018-01-26 – 2018-01-27 (×2): 2 [IU] via SUBCUTANEOUS
  Administered 2018-01-27: 1 [IU] via SUBCUTANEOUS
  Administered 2018-01-27 (×2): 2 [IU] via SUBCUTANEOUS
  Administered 2018-01-27 – 2018-01-28 (×3): 1 [IU] via SUBCUTANEOUS

## 2018-01-25 MED ORDER — SODIUM CHLORIDE 0.9 % IV SOLN
2.0000 g | Freq: Once | INTRAVENOUS | Status: AC
  Administered 2018-01-25: 2 g via INTRAVENOUS
  Filled 2018-01-25: qty 2

## 2018-01-25 MED ORDER — ONDANSETRON HCL 4 MG PO TABS: 4.0000 mg | ORAL_TABLET | Freq: Four times a day (QID) | ORAL | Status: DC | PRN

## 2018-01-25 MED ORDER — CITALOPRAM HYDROBROMIDE 20 MG PO TABS
20.0000 mg | ORAL_TABLET | Freq: Every day | ORAL | Status: DC
  Administered 2018-01-26 – 2018-02-01 (×6): 20 mg via ORAL
  Filled 2018-01-25 (×6): qty 1

## 2018-01-25 MED ORDER — SODIUM CHLORIDE 0.9 % IV SOLN
250.0000 mL | INTRAVENOUS | Status: DC | PRN
  Administered 2018-01-25 – 2018-01-28 (×3): 250 mL via INTRAVENOUS

## 2018-01-25 MED ORDER — MORPHINE SULFATE (PF) 4 MG/ML IV SOLN
4.0000 mg | Freq: Once | INTRAVENOUS | Status: AC
  Administered 2018-01-25: 4 mg via INTRAVENOUS
  Filled 2018-01-25: qty 1

## 2018-01-25 NOTE — Progress Notes (Signed)
Pharmacy Antibiotic Note  [name] [name] is a 73 y.o. female admitted on 01/25/2018 with cellulitis. Pharmacy has been consulted for vancomycin and cefepime dosing. Pt is afebrile and WBC is minimally elevated. Scr is elevated at 2.02 and lactic acid is <2.   Plan: Vancomycin 1560m IV x 1 then 1gm IV Q48H Cefepime 2gm IV x 1 then 1 gm IV Q24H F/u renal fxn, C&S, clinical status and peak/trough at SS     Temp (24hrs), Avg:97.3 F (36.3 C), Min:97 F (36.1 C), Max:97.5 F (36.4 C)  No results for input(s): WBC, CREATININE, LATICACIDVEN, VANCOTROUGH, VANCOPEAK, VANCORANDOM, GENTTROUGH, GENTPEAK, GENTRANDOM, TOBRATROUGH, TOBRAPEAK, TOBRARND, AMIKACINPEAK, AMIKACINTROU, AMIKACIN in the last 168 hours.  CrCl cannot be calculated (Patient's most recent lab result is older than the maximum 21 days allowed.).    Allergies  Allergen Reactions  . Other Shortness Of Breath, Rash and Other (See Comments)    CANNOT EAT ANY BERRIES!!  . Strawberry Extract Hives, Shortness Of Breath and Rash  . Sulfa Antibiotics Swelling and Other (See Comments)    Bodily Swelling  . Coffee Bean Extract [Coffea Arabica] Itching and Rash  . Tape Other (See Comments)    Tears skin.  Please use "paper" tape only.    Antimicrobials this admission: Vanc 1/17>> Cefepime 1/17>> Flagyl 1/17>>  Dose adjustments this admission: N/A  Microbiology results: Pending  Thank you for allowing pharmacy to be a part of this patient's care.  Dyson Sevey, RRande Lawman1/17/2020 3:52 PM

## 2018-01-25 NOTE — ED Notes (Signed)
Patient transported to CT 

## 2018-01-25 NOTE — ED Provider Notes (Signed)
[facility] EMERGENCY DEPARTMENT Provider Note   CSN: [id number] Arrival date & time: 01/25/18  1532     History   Chief Complaint No chief complaint on file.   HPI [name] [name] is a 73 y.o. female.  HPI Patient presented to the emergency room for evaluation of severe peripheral vascular disease.  Patient has a history of left femoral popliteal bypass back in September 2019.  Patient noticed that her left toe started changing color a couple weeks ago.  Patient apparently fell out of bed on December 8 and following that sustained an abrasion to her shins.  Patient states for the last couple of months she has noticed gradually worsening discoloration of her left lower extremity.  She has noticed gangrenous changes in her toe.  She also admits to having discomfort in her chest for the last couple of weeks.  Initially that was off and on but the last couple of weeks it has been more constant.  Patient went to the vascular clinic today.  While she was there they noted ischemic changes of her left lower leg below the knee as well as an open nonhealing wound in the anterior shin.  Patient also complained of some abdominal pain and she was noted to be hypotensive.  She was immediately transferred to the emergency room Past Medical History:  Diagnosis Date  . Arthritis   . Carotid artery disease (HCC)    a. Bilateral CEA; 50-60% bilateral ICA stenosis, 11/12  . Cellulitis    a. Recurrent, bilateral legs  . CHF (congestive heart failure) ([place])   . Chronic back pain   . Chronic diastolic heart failure ([place])   . Coronary atherosclerosis of native coronary artery    a. 11/2010 NSTEMI/CABG x 4: L-LAD; S-PL; S-OM; S-DX, by PVT.  [name] Critical lower limb ischemia   . Depression   . Essential hypertension, benign   . Herniated disc   . History of blood transfusion   . Hypothyroidism   . Ischemic cardiomyopathy    a. 11/2010 TEE: EF 40-45%.  . Migraine   . Mixed hyperlipidemia     . NSTEMI (non-ST elevated myocardial infarction) ([place]) 11/2010  . On home oxygen therapy   . PAD (peripheral artery disease) ([place])   . Pleural effusion   . Pneumonia 2000's X 2  . Stroke ([place]) 08/2009  . Suicide attempt ([place]) 08/2002  . TIA (transient ischemic attack)   . TMJ syndrome   . Type 2 diabetes mellitus ([place])   . Venous insufficiency     Patient Active Problem List   Diagnosis Date Noted  . 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 Pressure injury of skin 04/04/2017  . Acute on chronic diastolic CHF (congestive heart failure) ([place]) 03/31/2017  . Uncontrolled type 2 diabetes mellitus with hyperglycemia ([place]) 10/27/2016  . Class 1 obesity due to excess calories with serious comorbidity and body mass index (BMI) of 31.0 to 31.9 in adult 10/27/2016  . Acute on chronic diastolic (congestive) heart failure ([place]) 07/28/2016  . Acquired hypothyroidism 03/17/2016  . Subacute thyroiditis 11/26/2015  . Claudication ([place]) 11/04/2015  . Acute diastolic (congestive) heart failure ([place]) 09/14/2015  . CKD (chronic kidney disease) stage 3, GFR 30-59 ml/min (HCC) 07/27/2015  . Pleural effusion   . Recurrent pleural effusion on right   . Bradycardia, sinus 10/13/2014  . Palliative care encounter 10/13/2014  . Hypoglycemia 10/12/2014  . Anemia 10/12/2014  . Toe  ulcer ([place]) 10/12/2014  . Unresponsive episode 10/12/2014  . Pulmonary hypertension ([place])   . Chest pain   . Pleural effusion, right 09/04/2014  . Pressure ulcer 09/04/2014  . CHF (congestive heart failure) ([place]) 09/04/2014  . Hypertensive urgency 09/03/2014  . PVD (peripheral vascular disease) ([place]) 11/10/2013  . Critical lower limb ischemia 10/06/2013  . Chronic combined systolic and diastolic heart failure ([place]) 03/18/2013  . Diabetes mellitus type II, uncontrolled ([place]) 01/13/2011  . Hx of CABG 2012   . PAD (peripheral artery disease) ([place])   . Carotid artery  disease ([place])   . Hypertension, uncontrolled 01/14/2010  . Mixed hyperlipidemia 11/11/2009  . TOBACCO ABUSE 11/11/2009    Past Surgical History:  Procedure Laterality Date  . ABDOMINAL HYSTERECTOMY    . APPENDECTOMY    . CARDIAC CATHETERIZATION  11/2010  . CARDIAC CATHETERIZATION N/A 09/16/2015   Procedure: Right Heart Cath;  Surgeon: Larey Dresser, MD;  Location: [facility] CV LAB;  Service: Cardiovascular;  Laterality: N/A;  . CAROTID ENDARTERECTOMY Bilateral 2011   Bilateral - Dr. Kellie Simmering  . CHOLECYSTECTOMY    . COLONOSCOPY W/ BIOPSIES AND POLYPECTOMY    . CORONARY ANGIOPLASTY WITH STENT PLACEMENT  03/2013   "1"  . CORONARY ARTERY BYPASS GRAFT  11/24/2010   Procedure: CORONARY ARTERY BYPASS GRAFTING (CABG);  Surgeon: Tharon Aquas Adelene Idler, MD;  Location: [facility];  Service: Open Heart Surgery;  Laterality: N/A;  Coronary Artery Bypass Graft times four on pump utilizing left internal mammary artery and bilateral greater saphenous veins harvested endoscopically, transesophageal echocardiogram   . DEBRIDEMENT TOE Left    "nonhealing wound; 3rd digit  . DILATION AND CURETTAGE OF UTERUS    . ENDARTERECTOMY POPLITEAL Left 10/07/2013   Procedure: LEFT POPLITEAL ENDARTERECTOMY ;  Surgeon: Angelia Mould, MD;  Location: [facility];  Service: Vascular;  Laterality: Left;  . FEMORAL-POPLITEAL BYPASS GRAFT Left 10/07/2013   Procedure: LEFT FEMORAL-POPLITEAL ARTERY BYPASS GRAFT;  Surgeon: Angelia Mould, MD;  Location: [facility];  Service: Vascular;  Laterality: Left;  . FRACTURE SURGERY    . INTRAOPERATIVE ARTERIOGRAM Left 10/07/2013   Procedure: INTRA OPERATIVE ARTERIOGRAM LEFT LEG;  Surgeon: Angelia Mould, MD;  Location: [facility];  Service: Vascular;  Laterality: Left;  . IR GENERIC HISTORICAL  09/21/2015   IR REMOVAL OF PLURAL CATH W/CUFF 09/21/2015 Marybelle Killings, MD [facility]-INTERV RAD  . LEFT AND RIGHT HEART CATHETERIZATION WITH CORONARY/GRAFT ANGIOGRAM N/A 03/27/2013   Procedure: LEFT AND RIGHT  HEART CATHETERIZATION WITH Beatrix Fetters;  Surgeon: Burnell Blanks, MD;  Location: [facility] C[facility] LAB;  Service: Cardiovascular;  Laterality: N/A;  . LOWER EXTREMITY ANGIOGRAM  09/08/2012; 10/06/2013   "found 100% blockage; unsuccessful attempt at crossing a chronic total occlusion of the left SFA in the setting of critical limb ischemia  . LOWER EXTREMITY ANGIOGRAM Bilateral 09/08/2013   Procedure: LOWER EXTREMITY ANGIOGRAM;  Surgeon: [name], MD;  Location: [facility] CATH LAB;  Service: Cardiovascular;  Laterality: Bilateral;  . PERIPHERAL VASCULAR CATHETERIZATION  11/04/2015   Procedure: Peripheral Vascular Atherectomy;  Surgeon: [name], MD;  Location: [facility] CV LAB;  Service: Cardiovascular;;  RT. SFA  . PERIPHERAL VASCULAR CATHETERIZATION Bilateral 11/04/2015   Procedure: Lower Extremity Intervention;  Surgeon: [name], MD;  Location: [facility] CV LAB;  Service: Cardiovascular;  Laterality: Bilateral;  . PERIPHERAL VASCULAR CATHETERIZATION  11/04/2015   Procedure: Peripheral Vascular Balloon Angioplasty;  Surgeon: [name], MD;  Location: [facility] CV LAB;  Service: Cardiovascular;;  TP Trunk  . TOE SURGERY Left    "put pin in 2nd toe"  . TUBAL LIGATION    . WRIST FRACTURE SURGERY Left    "grafted bone from hip to wrist"     OB History   No obstetric history on file.      Home Medications    Prior to Admission medications   Medication Sig Start Date End Date Taking? Authorizing Provider  acetaminophen (TYLENOL) 500 MG tablet Take 500-1,000 mg by mouth every 6 (six) hours as needed for mild pain or moderate pain.     [provider]  amLODipine (NORVASC) 10 MG tablet TAKE 1 TABLET BY MOUTH ONCE DAILY Patient not taking: Reported on 01/25/2018 10/11/17   Satira Sark, MD  aspirin 81 MG EC tablet Take 81 mg by mouth daily. 03/29/13   Lyda Jester M, PA-C  atorvastatin (LIPITOR) 40 MG tablet TAKE 1 TABLET BY MOUTH ONCE  DAILY AT  6  PM 10/09/17   Satira Sark, MD  citalopram (CELEXA) 40 MG tablet Take 20 mg by mouth every morning.  05/20/15   [provider]  clopidogrel (PLAVIX) 75 MG tablet TAKE 1 TABLET BY MOUTH ONCE DAILY WITH BREAKFAST 10/09/17   Satira Sark, MD  clotrimazole-betamethasone (LOTRISONE) cream Apply 1 application topically 2 (two) times daily as needed. 01/24/16   [provider]  Docusate Calcium (STOOL SOFTENER PO) Take 200 mg by mouth at bedtime as needed (for constipation).     [provider]  gabapentin (NEURONTIN) 300 MG capsule Take 300-600 mg by mouth See admin instructions. 300 mg in the morning and 600 mg prior to bed(time) 08/02/14   [provider]  hydrALAZINE (APRESOLINE) 25 MG tablet Take 3 tablets by mouth every 8 hours. NEED APPOINTMENT Patient not taking: Reported on 01/25/2018 12/10/17   Bensimhon, Shaune Pascal, MD  insulin detemir (LEVEMIR) 100 UNIT/ML injection Inject 10 Units into the skin daily with breakfast.    [provider]  isosorbide mononitrate (IMDUR) 30 MG 24 hr tablet TAKE 1 TABLET BY MOUTH ONCE DAILY 10/22/17   Lendon Colonel, NP  JANUVIA 25 MG tablet TAKE 1 TABLET BY MOUTH ONCE DAILY 03/22/17   Cassandria Anger, MD  levothyroxine (SYNTHROID, LEVOTHROID) 75 MCG tablet Take 1 tablet (75 mcg total) by mouth daily before breakfast. 04/04/17   Murlean Iba, MD  Multiple Vitamin (MULTIVITAMIN WITH MINERALS) TABS tablet Take 1 tablet by mouth daily.    [provider]  nitroGLYCERIN (NITROSTAT) 0.4 MG SL tablet Place 1 tablet (0.4 mg total) under the tongue every 5 (five) minutes as needed for chest pain. Up to 3 doses. If no relief after 3rd dose, proceed to the ED for an evaluation 09/30/14   Satira Sark, MD  nystatin (MYCOSTATIN/NYSTOP) powder Apply 1 application topically 2 (two) times daily as needed. 01/24/16   [provider]  potassium chloride SA (K-DUR,KLOR-CON) 20 MEQ tablet  TAKE 1 TABLET BY MOUTH TWICE DAILY 10/09/17   Satira Sark, MD  tiZANidine (ZANAFLEX) 4 MG tablet Take 4 mg by mouth 2 (two) times daily as needed for muscle spasms.    [provider]  torsemide (DEMADEX) 20 MG tablet Take 4 tablets (80 mg total) by mouth 2 (two) times daily. 10/22/17 01/20/18  Herminio Commons, MD  traMADol Veatrice Bourbon) 50 MG tablet Take 50 mg by mouth. 08/28/16   [provider]    Family History Family History  Problem Relation Age of Onset  . Coronary artery disease Father        Died with MI age 66  . Heart disease Father        before age 63  . Heart attack Father   . Diabetes type II Mother   . Hypertension Mother   . Diabetes Mother   . Heart disease Mother   . Hyperlipidemia Mother   . Heart failure Sister   . Cancer Sister   . Cancer Brother        Lung cancer  . Diabetes Daughter   . Hyperlipidemia Daughter   . Diabetes Son   . Hyperlipidemia Son     Social History Social History   Tobacco Use  . Smoking status: Former Smoker    Packs/day: 3.00    Years: 50.00    Pack years: 150.00    Types: Cigarettes    Start date: 01/24/1956    Last attempt to quit: 10/09/2009    Years since quitting: 8.3  . Smokeless tobacco: Never Used  Substance Use Topics  . Alcohol use: Yes    Alcohol/week: 0.0 standard drinks    Comment: 10/06/2013 "might have a drink q 2-3 months"  . Drug use: Yes    Types: Marijuana    Comment: "smoked pot in my teens"     Allergies   Other; Strawberry extract; Sulfa antibiotics; Coffee bean extract [coffea arabica]; and Tape   Review of Systems Review of Systems  Constitutional: Negative for fever.  Respiratory: Negative for shortness of breath.   Cardiovascular: Negative for chest pain.  Gastrointestinal: Positive for abdominal pain.  Genitourinary: Negative for dysuria.  All other systems reviewed and are negative.    Physical Exam Updated Vital Signs BP (!) 143/46   Pulse (!) 46   Temp  (!) 97.5 F (36.4 C) (Oral)   Resp (!) 21   SpO2 99%   Physical Exam Vitals signs and nursing note reviewed.  Constitutional:      Appearance: She is well-developed. She is ill-appearing.  HENT:     Head: Normocephalic and atraumatic.     Right Ear: External ear normal.     Left Ear: External ear normal.  Eyes:     General: No scleral icterus.       Right eye: No discharge.        Left eye: No discharge.     Conjunctiva/sclera: Conjunctivae normal.  Neck:     Musculoskeletal: Neck supple.     Trachea: No tracheal deviation.  Cardiovascular:     Rate and Rhythm: Normal rate and regular rhythm.  Pulmonary:     Effort: Pulmonary effort is normal. No respiratory distress.     Breath sounds: Normal breath sounds. No stridor. No wheezing or rales.  Abdominal:     General: Bowel sounds are normal. There is no distension.     Palpations: Abdomen is soft.     Tenderness: There is abdominal tenderness. There is no guarding or rebound.  Musculoskeletal:        General: Tenderness present.  Skin:    General: Skin is dry.     Comments: Patient's lower extremity has a dressing in place but images of her wound from today were reviewed, the dressing she has black gangrenous digits, foot is cyanotic and cool  Neurological:     Mental Status: She is alert.     Cranial Nerves: No cranial nerve deficit (no facial droop, extraocular movements intact, no slurred  speech).     Sensory: No sensory deficit.     Motor: No abnormal muscle tone or seizure activity.     Coordination: Coordination normal.      ED Treatments / Results  Labs (all labs ordered are listed, but only abnormal results are displayed) Labs Reviewed  COMPREHENSIVE METABOLIC PANEL - Abnormal; Notable for the following components:      Result Value   Glucose, Bld 185 (*)    BUN 44 (*)    Creatinine, Ser 2.02 (*)    Calcium 8.8 (*)    Albumin 2.9 (*)    GFR calc non Af Amer 24 (*)    GFR calc Af Amer 28 (*)    All  other components within normal limits  CBC WITH DIFFERENTIAL/PLATELET - Abnormal; Notable for the following components:   WBC 10.6 (*)    Hemoglobin 10.6 (*)    MCH 24.8 (*)    MCHC 29.2 (*)    RDW 19.7 (*)    nRBC 0.3 (*)    Neutro Abs 9.0 (*)    All other components within normal limits  I-STAT CG4 LACTIC ACID, ED - Abnormal; Notable for the following components:   Lactic Acid, Venous <0.30 (*)    All other components within normal limits  CULTURE, BLOOD (ROUTINE X 2)  CULTURE, BLOOD (ROUTINE X 2)  CK  LIPASE, BLOOD  TROPONIN I  URINALYSIS, ROUTINE W REFLEX MICROSCOPIC  I-STAT CG4 LACTIC ACID, ED    EKG None  Radiology Ct Abdomen Pelvis Wo Contrast  Result Date: 01/25/2018 CLINICAL DATA:  Abdomen pain for 3 days. EXAM: CT ABDOMEN AND PELVIS WITHOUT CONTRAST TECHNIQUE: Multidetector CT imaging of the abdomen and pelvis was performed following the standard protocol without IV contrast. COMPARISON:  May 16, 2014 FINDINGS: Lower chest: Mild atelectasis of bilateral lung bases are noted. The heart size is enlarged. Hepatobiliary: No focal liver abnormality is seen. Status post cholecystectomy. No biliary dilatation. Pancreas: Unremarkable. No pancreatic ductal dilatation or surrounding inflammatory changes. Spleen: Normal in size without focal abnormality. Adrenals/Urinary Tract: Adrenal glands are unremarkable. Kidneys are normal, without renal calculi, focal lesion, or hydronephrosis. Bladder is unremarkable. Stomach/Bowel: Small hiatal hernia is identified. The stomach is otherwise normal. Prior appendectomy. No evidence of bowel wall thickening, distention, or inflammatory changes. Vascular/Lymphatic: Aortic atherosclerosis. No enlarged abdominal or pelvic lymph nodes. Reproductive: Status post hysterectomy. No adnexal masses. Other: Left inguinal herniation of mesenteric fat is identified. Minimal umbilical hernia of mesenteric fat is noted. Musculoskeletal: Degenerative joint changes of  the spine are identified. IMPRESSION: No acute abnormality identified in the abdomen and pelvis. Moderate bowel content is identified throughout colon. Electronically Signed   By: Abelardo Diesel M.D.   On: 01/25/2018 19:42   Dg Chest Port 1 View  Result Date: 01/25/2018 CLINICAL DATA:  73 year old female with weakness. Chest pain for 1 week. EXAM: PORTABLE CHEST 1 VIEW COMPARISON:  03/31/2017 and earlier. FINDINGS: Portable AP semi upright view at 1553 hours. Stable cardiomegaly and mediastinal contours. Prior CABG. Allowing for portable technique the lungs are clear. No pneumothorax. Visualized tracheal air column is within normal limits. IMPRESSION: No acute cardiopulmonary abnormality. Electronically Signed   By: Genevie Ann M.D.   On: 01/25/2018 16:11   Vas [place] Lower Extremity Arterial Duplex  Result Date: 01/25/2018 LOWER EXTREMITY ARTERIAL DUPLEX STUDY Indications: Ulceration of the left lower extremity calf., gangrene / discharge              of left toes.  Vascular Interventions: Left femoral to popliteal bypass graft 09/2013. Current ABI:            Not obtained Limitations: Patient unable to be properly positioned. Recent release from              nursing home. Comparison Study: [place] since prior exam of 02/23/2016 Performing Technologist: Alvia Grove RVT  Examination Guidelines: A complete evaluation includes B-mode imaging, spectral Doppler, color Doppler, and power Doppler as needed of all accessible portions of each vessel. Bilateral testing is considered an integral part of a complete examination. Limited examinations for reoccurring indications may be performed as noted.  Left Graft #1: +--------------------+-------------+--------+----------+----------------------+                     PSV cm/s     StenosisWaveform  Comments               +--------------------+-------------+--------+----------+----------------------+ Inflow              137                  biphasic                          +--------------------+-------------+--------+----------+----------------------+ Proximal Anastomosis106                  biphasic                         +--------------------+-------------+--------+----------+----------------------+ Proximal Graft      40 / occludedoccluded          Prestenotic / occluded +--------------------+-------------+--------+----------+----------------------+ Mid Graft                        occluded                                 +--------------------+-------------+--------+----------+----------------------+ Distal Graft                     occluded                                 +--------------------+-------------+--------+----------+----------------------+ Distal Anastamosis               occluded                                 +--------------------+-------------+--------+----------+----------------------+ Outflow             37                   monophasicdampened               +--------------------+-------------+--------+----------+----------------------+  Summary: Left: No color flow or Doppler signal in the femoral to popliteal graft consistent with occlusion.  See table(s) above for measurements and observations. Electronically signed by Servando Snare MD on 01/25/2018 at 3:14:46 PM.    Final     Procedures .Critical Care Performed by: Dorie Rank, MD Authorized by: Dorie Rank, MD   Critical care provider statement:    Critical care time (minutes):  45   Critical care was time spent personally by me on the following activities:  Discussions with consultants, evaluation of patient's response to treatment, examination of patient, ordering and performing  treatments and interventions, ordering and review of laboratory studies, ordering and review of radiographic studies, pulse oximetry, re-evaluation of patient's condition, obtaining history from patient or surrogate and review of old charts   (including critical care  time)  Medications Ordered in ED Medications  metroNIDAZOLE (FLAGYL) IVPB 500 mg (0 mg Intravenous Stopped 01/25/18 1746)  sodium chloride 0.9 % bolus 1,000 mL (0 mLs Intravenous Stopped 01/25/18 1746)    Followed by  0.9 %  sodium chloride infusion (1,000 mLs Intravenous New Bag/Given 01/25/18 1747)  ceFEPIme (MAXIPIME) 1 g in sodium chloride 0.9 % 100 mL IVPB (has no administration in time range)  vancomycin (VANCOCIN) IVPB 1000 mg/200 mL premix (has no administration in time range)  ceFEPIme (MAXIPIME) 2 g in sodium chloride 0.9 % 100 mL IVPB (0 g Intravenous Stopped 01/25/18 1716)  vancomycin (VANCOCIN) 1,500 mg in sodium chloride 0.9 % 500 mL IVPB (1,500 mg Intravenous New Bag/Given 01/25/18 1723)  morphine 4 MG/ML injection 4 mg (4 mg Intravenous Given 01/25/18 1748)     Initial Impression / Assessment and Plan / ED Course  I have reviewed the triage vital signs and the nursing notes.  Pertinent labs & imaging results that were available during my care of the patient were reviewed by me and considered in my medical decision making (see chart for details).  Clinical Course as of Jan 25 2010  Fri Jan 25, 2018  2012 Case discussed with Dr Trula Slade and Dr Jonelle Sidle while pt was in the ED   [JK]    Clinical Course User Index [JK] Dorie Rank, MD  Patient presented to the emergency room for evaluation and ischemic lower extremity.  Patient has obvious ischemia on her left lower extremity.  She has been evaluated by vascular surgery and will likely need an amputation.  Patient did have some complaints of abdominal discomfort earlier.  She was hypotensive and the vascular clinic.  In the ED the patient was treated with IV fluids and empirically for possible sepsis.  Her blood pressure has remained stable.  Chest x-ray does not show any evidence of pneumonia.  Her CT abdomen pelvis does not show any acute abnormalities.  Lactic acid level is reassuring.  At this time I doubt severe sepsis.  Plan on  admission to the hospital and further treatment of her left lower leg ischemia.  Final Clinical Impressions(s) / ED Diagnoses   Final diagnoses:  Toe gangrene ([place])  Occlusion of femoropopliteal bypass graft, initial encounter [facility])       Dorie Rank, MD 01/25/18 2012

## 2018-01-25 NOTE — ED Triage Notes (Addendum)
Pt coming from vein specialist for evaluation of gangrene L foot; endorses injury to foot in December, abrasions; hypotensive at vein specialist, 22G systyolic; hx DM, dementia; pt at baseline per daughter; pt also c/o constant CP for the past few days  124/56 HR 47 RR 30 97% 2L  CBG 204 CO2 22

## 2018-01-25 NOTE — Progress Notes (Addendum)
[facility] [place]   CC: Follow up peripheral artery occlusive disease  History of Present Illness [name] [name] is a 73 y.o. female who is s/p left femoral to popliteal bypass graft on 10/07/13 with popliteal endarterectomy by Dr. Scot Dock.   She had bilateral CEA in 2011, had a a TIA prior to this, none subsequently per pt.   She returns today with critical ischemia of left lower leg. Left toe started changing color about 2 weeks ago, per daughter.  Pt fell out of bed bed about December 16, 2017, seen in ED at [facility], c-spine fx ruled out.  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  Pt denies fever or chills, but states she does feel cold, is nauseated now, states she does have pain in her entire left leg for a long time.   She is at [facility] rehab center in [place].   Aortogram done by Dr. Gwenlyn Found in August 2015 results as follows: 1: Abdominal aortogram-the distal abdominal aorta was free of significant disease 2: Left lower extremity-left SFA was occluded just beyond the origin of the profunda takeoff and reconstituted in the adductor canal. There was moderate popliteal disease with two-vessel runoff. The anterior tibial is occluded, the posterior tibial was diseased and the peroneal was the dominant vessel. 3: Right lower extremity-the right SFA had a 90% fairly focal stenosis the midportion followed by a 50% segmental stenosis just beyond that. There is 1 vessel runoff via the peroneal.  Daughter states that pt has a chin tremor when tired, has had hand tremors fr a long time.  She had a Pleurex catheter placed and removed not too long ago.    11-21-17 visit with Dr. Domenic Polite: BP 122/70, Pulse 53. She has diastolic heart failure.    Diabetic: Yes, daughter states last A1C was 6.5 Tobacco use: former smoker, quit about  2010, started at age 68 years   Pt meds include: Statin :Yes Betablocker: No ASA: Yes Other anticoagulants/antiplatelets: Plavix  Past Medical History:  Diagnosis Date  . Arthritis   . Carotid artery disease (HCC)    a. Bilateral CEA; 50-60% bilateral ICA stenosis, 11/12  . Cellulitis    a. Recurrent, bilateral legs  . CHF (congestive heart failure) ([place])   . Chronic back pain   . Chronic diastolic heart failure ([place])   . Coronary atherosclerosis of native coronary artery    a. 11/2010 NSTEMI/CABG x 4: L-LAD; S-PL; S-OM; S-DX, by PVT.  [name] Critical lower limb ischemia   . Depression   . Essential hypertension, benign   . Herniated disc   . History of blood transfusion   . Hypothyroidism   . Ischemic cardiomyopathy    a. 11/2010 TEE: EF 40-45%.  . Migraine   . Mixed hyperlipidemia   . NSTEMI (non-ST elevated myocardial infarction) ([place]) 11/2010  . On home oxygen therapy   . PAD (peripheral artery disease) ([place])   . Pleural effusion   . Pneumonia 2000's X 2  . Stroke ([place]) 08/2009  . Suicide attempt ([place]) 08/2002  . TIA (transient ischemic attack)   . TMJ syndrome   . Type 2 diabetes mellitus ([place])   . Venous insufficiency     Social History Social History   Tobacco Use  . Smoking status: Former Smoker    Packs/day: 3.00    Years:  50.00    Pack years: 150.00    Types: Cigarettes    Start date: 01/24/1956    Last attempt to quit: 10/09/2009    Years since quitting: 8.3  . Smokeless tobacco: Never Used  Substance Use Topics  . Alcohol use: Yes    Alcohol/week: 0.0 standard drinks    Comment: 10/06/2013 "might have a drink q 2-3 months"  . Drug use: Yes    Types: Marijuana    Comment: "smoked pot in my teens"    Family History Family History  Problem Relation Age of Onset  . Coronary artery disease Father        Died with MI age 23  . Heart disease Father        before age 47  . Heart attack Father   . Diabetes type II Mother   . Hypertension Mother   .  Diabetes Mother   . Heart disease Mother   . Hyperlipidemia Mother   . Heart failure Sister   . Cancer Sister   . Cancer Brother        Lung cancer  . Diabetes Daughter   . Hyperlipidemia Daughter   . Diabetes Son   . Hyperlipidemia Son     Past Surgical History:  Procedure Laterality Date  . ABDOMINAL HYSTERECTOMY    . APPENDECTOMY    . CARDIAC CATHETERIZATION  11/2010  . CARDIAC CATHETERIZATION N/A 09/16/2015   Procedure: Right Heart Cath;  Surgeon: Larey Dresser, MD;  Location: [place] [facility];  Service: Cardiovascular;  Laterality: N/A;  . CAROTID ENDARTERECTOMY Bilateral 2011   Bilateral - Dr. Kellie Simmering  . CHOLECYSTECTOMY    . COLONOSCOPY W/ BIOPSIES AND POLYPECTOMY    . CORONARY ANGIOPLASTY WITH STENT PLACEMENT  03/2013   "1"  . CORONARY ARTERY BYPASS GRAFT  11/24/2010   Procedure: CORONARY ARTERY BYPASS GRAFTING (CABG);  Surgeon: Tharon Aquas Adelene Idler, MD;  Location: [place];  Service: Open Heart Surgery;  Laterality: N/A;  Coronary Artery Bypass Graft times four on pump utilizing left internal mammary artery and bilateral greater saphenous veins harvested endoscopically, transesophageal echocardiogram   . DEBRIDEMENT TOE Left    "nonhealing wound; 3rd digit  . DILATION AND CURETTAGE OF UTERUS    . ENDARTERECTOMY POPLITEAL Left 10/07/2013   Procedure: LEFT POPLITEAL ENDARTERECTOMY ;  Surgeon: Angelia Mould, MD;  Location: [place];  Service: Vascular;  Laterality: Left;  . FEMORAL-POPLITEAL BYPASS GRAFT Left 10/07/2013   Procedure: LEFT FEMORAL-POPLITEAL ARTERY BYPASS GRAFT;  Surgeon: Angelia Mould, MD;  Location: [place];  Service: Vascular;  Laterality: Left;  . FRACTURE SURGERY    . INTRAOPERATIVE ARTERIOGRAM Left 10/07/2013   Procedure: INTRA OPERATIVE ARTERIOGRAM LEFT LEG;  Surgeon: Angelia Mould, MD;  Location: [place];  Service: Vascular;  Laterality: Left;  . IR GENERIC HISTORICAL  09/21/2015   IR REMOVAL OF PLURAL CATH W/CUFF 09/21/2015 Marybelle Killings, MD  [facility]-INTERV RAD  . LEFT AND RIGHT HEART CATHETERIZATION WITH CORONARY/GRAFT ANGIOGRAM N/A 03/27/2013   Procedure: LEFT AND RIGHT HEART CATHETERIZATION WITH Beatrix Fetters;  Surgeon: Burnell Blanks, MD;  Location: [facility] C[facility] LAB;  Service: Cardiovascular;  Laterality: N/A;  . LOWER EXTREMITY ANGIOGRAM  09/08/2012; 10/06/2013   "found 100% blockage; unsuccessful attempt at crossing a chronic total occlusion of the left SFA in the setting of critical limb ischemia  . LOWER EXTREMITY ANGIOGRAM Bilateral 09/08/2013   Procedure: LOWER EXTREMITY ANGIOGRAM;  Surgeon: [name], MD;  Location: [facility] CATH LAB;  Service: Cardiovascular;  Laterality: Bilateral;  . PERIPHERAL VASCULAR CATHETERIZATION  11/04/2015   Procedure: Peripheral Vascular Atherectomy;  Surgeon: [name], MD;  Location: [place] [facility];  Service: Cardiovascular;;  RT. SFA  . PERIPHERAL VASCULAR CATHETERIZATION Bilateral 11/04/2015   Procedure: Lower Extremity Intervention;  Surgeon: [name], MD;  Location: [place] [facility];  Service: Cardiovascular;  Laterality: Bilateral;  . PERIPHERAL VASCULAR CATHETERIZATION  11/04/2015   Procedure: Peripheral Vascular Balloon Angioplasty;  Surgeon: [name], MD;  Location: [place] [facility];  Service: Cardiovascular;;  TP Trunk  . TOE SURGERY Left    "put pin in 2nd toe"  . TUBAL LIGATION    . WRIST FRACTURE SURGERY Left    "grafted bone from hip to wrist"    Allergies  Allergen Reactions  . Other Shortness Of Breath, Rash and Other (See Comments)    CANNOT EAT ANY BERRIES!!  . Strawberry Extract Hives, Shortness Of Breath and Rash  . Sulfa Antibiotics Swelling and Other (See Comments)    Bodily Swelling  . Coffee Bean Extract [Coffea Arabica] Itching and Rash  . Tape Other (See Comments)    Tears skin.  Please use "paper" tape only.    Current Outpatient Medications  Medication Sig Dispense Refill  . acetaminophen (TYLENOL) 500 MG tablet Take  500-1,000 mg by mouth every 6 (six) hours as needed for mild pain or moderate pain.     [name] aspirin 81 MG EC tablet Take 81 mg by mouth daily.    [name] atorvastatin (LIPITOR) 40 MG tablet TAKE 1 TABLET BY MOUTH ONCE DAILY AT  6  PM 90 tablet 0  . citalopram (CELEXA) 40 MG tablet Take 20 mg by mouth every morning.     . clopidogrel (PLAVIX) 75 MG tablet TAKE 1 TABLET BY MOUTH ONCE DAILY WITH BREAKFAST 90 tablet 0  . clotrimazole-betamethasone (LOTRISONE) cream Apply 1 application topically 2 (two) times daily as needed.    Mariane Baumgarten Calcium (STOOL SOFTENER PO) Take 200 mg by mouth at bedtime as needed (for constipation).     . gabapentin (NEURONTIN) 300 MG capsule Take 300-600 mg by mouth See admin instructions. 300 mg in the morning and 600 mg prior to bed(time)    . insulin detemir (LEVEMIR) 100 UNIT/ML injection Inject 10 Units into the skin daily with breakfast.    . isosorbide mononitrate (IMDUR) 30 MG 24 hr tablet TAKE 1 TABLET BY MOUTH ONCE DAILY 90 tablet 1  . JANUVIA 25 MG tablet TAKE 1 TABLET BY MOUTH ONCE DAILY 30 tablet 3  . levothyroxine (SYNTHROID, LEVOTHROID) 75 MCG tablet Take 1 tablet (75 mcg total) by mouth daily before breakfast.    . Multiple Vitamin (MULTIVITAMIN WITH MINERALS) TABS tablet Take 1 tablet by mouth daily.    . nitroGLYCERIN (NITROSTAT) 0.4 MG SL tablet Place 1 tablet (0.4 mg total) under the tongue every 5 (five) minutes as needed for chest pain. Up to 3 doses. If no relief after 3rd dose, proceed to the ED for an evaluation 25 tablet 3  . nystatin (MYCOSTATIN/NYSTOP) powder Apply 1 application topically 2 (two) times daily as needed.    . potassium chloride SA (K-DUR,KLOR-CON) 20 MEQ tablet TAKE 1 TABLET BY MOUTH TWICE DAILY 180 tablet 0  . tiZANidine (ZANAFLEX) 4 MG tablet Take 4 mg by mouth 2 (two) times daily as needed for muscle spasms.    . traMADol (ULTRAM) 50 MG tablet Take 50 mg by mouth.    [name]  amLODipine (NORVASC) 10 MG tablet TAKE 1 TABLET BY MOUTH ONCE DAILY  (Patient not taking: Reported on 01/25/2018) 90 tablet 0  . hydrALAZINE (APRESOLINE) 25 MG tablet Take 3 tablets by mouth every 8 hours. NEED APPOINTMENT (Patient not taking: Reported on 01/25/2018) 270 tablet 0  . torsemide (DEMADEX) 20 MG tablet Take 4 tablets (80 mg total) by mouth 2 (two) times daily. 720 tablet 3   No current facility-administered medications for this visit.     ROS: See HPI for pertinent positives and negatives.   Physical Examination  Vitals:   01/25/18 1356  BP: (!) 89/56  Pulse: (!) 170  Resp: 20  Temp: (!) 97 F (36.1 C)  TempSrc: Oral  SpO2: (!) 88%  Weight: 195 lb (88.5 kg)  Height: _0  (1.676 m)   Her heart rate is 56/minute, not 170/minute  Body mass index is 31.47 kg/m.  General: A&O x 3, WDWN, obese female. Gait: seated in her w/c HENT: No gross abnormalities.  Eyes: PERRLA. Pulmonary: Respirations are non labored, CTAB, fair air movement in all fields, rales in right posterior fields, no rhonchi or wheezes. O2/[place] at 2L/min, 88% SAO2 on 2L O2 Cardiac: regular rhythm, bradycardic at 56/minute, no detected murmur.         Carotid Bruits Right Left   Negative Negative   Radial pulses are not palpable, brachial pulses are not palpable bilaterally (hypotensive)  Adominal aortic pulse is not palpable                         VASCULAR EXAM: Extremities with ischemic changes, with Gangrene in left 1-3 toes; with open wounds. See photos below. Hemosiderin deposits both lower legs with mild venous stasis dermatitis.   Left leg and foot                                                                                                              LE Pulses Right Left       FEMORAL  not palpable, seated in w/c, obese  not palpable        POPLITEAL  not palpable   not palpable       POSTERIOR TIBIAL  not palpable   not palpable        DORSALIS PEDIS      ANTERIOR TIBIAL not palpable  not palpable    Abdomen: soft, mildly tender to  palpation in all quadrants, no palpable masses. Skin: See Extremities. Musculoskeletal: no muscle wasting or atrophy.  Neurologic: A&O X 3; appropriate affect, Sensation is normal; MOTOR FUNCTION:  moving all extremities equally, motor strength 4/5 in UE's, 2-3/5 in LE's. Speech is terse, pt responds appropriately to questions. CN 2-12 grossly intact. Bilateral resting hand tremor. Resting mandible tremor.  Psychiatric: Thought content is subdued, mood appropriate for clinical situation.     ASSESSMENT: CHAMARA DYCK is a 73 y.o. female with hx of left femoral to popliteal bypass grafting with PTFE and popliteal endarterectomy on 10/07/13. She also has  a hx of bilateral CEA in 2011, had a TIA before the CEA in 2011, none subsequently.   Her left fem-pop bypass graft was open at her visit on 02-23-16, is occluded today.   Serum creatinine was 1.89 on 09-03-17, no more recent result available.   Dr. Donzetta Matters spoke with pt and daughter and examined pt.  Pt is unstable, hypotensive, nauseated, has diffuse abdominal pain on palpation, has a gangrenous left great toe, and large non healing wound in left lower leg. Her left fem-pop bypass graft is occluded according to left leg arterial duplex today. Dr. Donzetta Matters advised pt and her daughter that pt may lose her left leg to save her life.   I spoke with Claiborne Billings, charge nurse at [facility] ED, and advised her that pt to arrive by EMS for stabilization and addressing critical ischemia of left lower leg. Dr. Donzetta Matters is speaking with Dr. Trula Slade, who will see pt in the ED.   DATA  02-23-16 left LE arterial duplex demonstrates left fem-pop bypass graft with no stenosis. Increased velocity in inflow (286 cm/s) and proximal anastomosis (330 cm/s) With heterogenous plaque noted in the inflow artery, no significant plaque visualized in the proximal anastomosis.  No significant change compared to the last exam on 11-19-15 at [facility]. ABI at that time was 0.70 right and 0.90  left.   Left LE Arterial Duplex (01-25-18): Left Graft #1: +--------------------+-------------+--------+----------+----------------------+                     PSV cm/s     StenosisWaveform  Comments               +--------------------+-------------+--------+----------+----------------------+ Inflow              137                  biphasic                         +--------------------+-------------+--------+----------+----------------------+ Proximal Anastomosis106                  biphasic                         +--------------------+-------------+--------+----------+----------------------+ Proximal Graft      40 / occludedoccluded          Prestenotic / occluded +--------------------+-------------+--------+----------+----------------------+ Mid Graft                        occluded                                 +--------------------+-------------+--------+----------+----------------------+ Distal Graft                     occluded                                 +--------------------+-------------+--------+----------+----------------------+ Distal Anastamosis               occluded                                 +--------------------+-------------+--------+----------+----------------------+ Outflow             37  monophasicdampened               +--------------------+-------------+--------+----------+----------------------+  Summary: Left: No color flow or Doppler signal in the femoral to popliteal graft consistent with occlusion.   Carotid duplex 03-09-13 requested by Dr. Gwenlyn Found: 50-69% bilateral ICA stenoses   PLAN:  Pt to [facility] ED via EMS.   Clemon Chambers, RN, MSN, FNP-C [facility] [employer] Phone: [phone]  Clinic MD: Donzetta Matters  01/25/18 2:10 PM

## 2018-01-25 NOTE — H&P (Addendum)
History and Physical    [name] [name] DXI:[id number] DOB: 29-Mar-1945 DOA: 01/25/2018  PCP: Glenda Chroman, MD   Patient coming from: Home   Chief Complaint: Left foot discoloration, non-healing left shin wound, chest pain   HPI: [name] [name] is a 73 y.o. female with medical history significant for peripheral arterial disease status post left femoral-popliteal bypass, CAD status post CABG, chronic diastolic CHF, chronic kidney disease stage IV, hypothyroidism, insulin-dependent diabetes mellitus, and chronic back pain, now presenting to the emergency department for evaluation of discoloration of her left foot and a nonhealing wound on the left shin.  Patient is accompanied by her daughter who assists with the history.  Patient slid out of bed in early December 2019, had some abrasions to both shins, and has noted worsening discoloration of the lower left leg since that time, and the wound on the left shin has failed to heal.  Over the past week or so, toe discoloration on the left has worsened significantly, she has had increasing fatigue and malaise, and has persistent bloody drainage from her left shin wound.  She has complained of chest pain intermittently for the past few days, localized to the central chest, with no alleviating or exacerbating factors identified, without diaphoresis or nausea, and with no interventions attempted at home.  She denies any chest pain today.  She has not noted any fevers or chills.  She was seen in the vascular clinic today, had ultrasound performed with no color-flow or Doppler signal in the femoral to popliteal graft on the left, concerning for occlusion.  She was directed to the ED.  ED Course: Upon arrival to the ED, patient is found to have oral temp of 36.4 C, bradycardia in the 40s to 50s, initial blood pressure 89/56, and saturating 100% on 2 L/min of supplemental oxygen.  EKG features sinus bradycardia with rate 49, PVC, and RAD.  Chest x-ray is negative for  acute cardiopulmonary disease.  She complained of abdominal pain and had CT of the abdomen and pelvis which is negative for any acute intra-abdominal or pelvic abnormality.  Chemistry panel is notable for a BUN of 44 and creatinine of 2.02, similar to priors.  CBC features a mild leukocytosis, lactic acid is reassuringly normal, and troponin is undetectable.  Serum CK is normal.  Blood cultures were collected, 1 L of normal saline administered, and the patient was treated with vancomycin, cefepime, and Flagyl.  Vascular surgery was consulted by the ED physician.  Patient will be admitted for further evaluation and management of left leg ischemia with gangrenous changes to the left foot and nonhealing left shin wound.  Review of Systems:  All other systems reviewed and apart from HPI, are negative.  Past Medical History:  Diagnosis Date  . Arthritis   . Carotid artery disease (HCC)    a. Bilateral CEA; 50-60% bilateral ICA stenosis, 11/12  . Cellulitis    a. Recurrent, bilateral legs  . CHF (congestive heart failure) ([place])   . Chronic back pain   . Chronic diastolic heart failure ([place])   . Coronary atherosclerosis of native coronary artery    a. 11/2010 NSTEMI/CABG x 4: L-LAD; S-PL; S-OM; S-DX, by PVT.  [name] Critical lower limb ischemia   . Depression   . Essential hypertension, benign   . Herniated disc   . History of blood transfusion   . Hypothyroidism   . Ischemic cardiomyopathy    a. 11/2010 TEE: EF 40-45%.  . Migraine   .  Mixed hyperlipidemia   . NSTEMI (non-ST elevated myocardial infarction) ([place]) 11/2010  . On home oxygen therapy   . PAD (peripheral artery disease) ([place])   . Pleural effusion   . Pneumonia 2000's X 2  . Stroke ([place]) 08/2009  . Suicide attempt ([place]) 08/2002  . TIA (transient ischemic attack)   . TMJ syndrome   . Type 2 diabetes mellitus ([place])   . Venous insufficiency     Past Surgical History:  Procedure Laterality Date  . ABDOMINAL HYSTERECTOMY    .  APPENDECTOMY    . CARDIAC CATHETERIZATION  11/2010  . CARDIAC CATHETERIZATION N/A 09/16/2015   Procedure: Right Heart Cath;  Surgeon: Larey Dresser, MD;  Location: [facility];  Service: Cardiovascular;  Laterality: N/A;  . CAROTID ENDARTERECTOMY Bilateral 2011   Bilateral - Dr. Kellie Simmering  . CHOLECYSTECTOMY    . COLONOSCOPY W/ BIOPSIES AND POLYPECTOMY    . CORONARY ANGIOPLASTY WITH STENT PLACEMENT  03/2013   "1"  . CORONARY ARTERY BYPASS GRAFT  11/24/2010   Procedure: CORONARY ARTERY BYPASS GRAFTING (CABG);  Surgeon: Tharon Aquas Adelene Idler, MD;  Location: [facility];  Service: Open Heart Surgery;  Laterality: N/A;  Coronary Artery Bypass Graft times four on pump utilizing left internal mammary artery and bilateral greater saphenous veins harvested endoscopically, transesophageal echocardiogram   . DEBRIDEMENT TOE Left    "nonhealing wound; 3rd digit  . DILATION AND CURETTAGE OF UTERUS    . ENDARTERECTOMY POPLITEAL Left 10/07/2013   Procedure: LEFT POPLITEAL ENDARTERECTOMY ;  Surgeon: Angelia Mould, MD;  Location: [facility];  Service: Vascular;  Laterality: Left;  . FEMORAL-POPLITEAL BYPASS GRAFT Left 10/07/2013   Procedure: LEFT FEMORAL-POPLITEAL ARTERY BYPASS GRAFT;  Surgeon: Angelia Mould, MD;  Location: [facility];  Service: Vascular;  Laterality: Left;  . FRACTURE SURGERY    . INTRAOPERATIVE ARTERIOGRAM Left 10/07/2013   Procedure: INTRA OPERATIVE ARTERIOGRAM LEFT LEG;  Surgeon: Angelia Mould, MD;  Location: [facility];  Service: Vascular;  Laterality: Left;  . IR GENERIC HISTORICAL  09/21/2015   IR REMOVAL OF PLURAL CATH W/CUFF 09/21/2015 Marybelle Killings, MD [facility]-INTERV RAD  . LEFT AND RIGHT HEART CATHETERIZATION WITH CORONARY/GRAFT ANGIOGRAM N/A 03/27/2013   Procedure: LEFT AND RIGHT HEART CATHETERIZATION WITH Beatrix Fetters;  Surgeon: Burnell Blanks, MD;  Location: [facility] C[facility];  Service: Cardiovascular;  Laterality: N/A;  . LOWER EXTREMITY ANGIOGRAM  09/08/2012; 10/06/2013     "found 100% blockage; unsuccessful attempt at crossing a chronic total occlusion of the left SFA in the setting of critical limb ischemia  . LOWER EXTREMITY ANGIOGRAM Bilateral 09/08/2013   Procedure: LOWER EXTREMITY ANGIOGRAM;  Surgeon: [name], MD;  Location: [facility] CATH LAB;  Service: Cardiovascular;  Laterality: Bilateral;  . PERIPHERAL VASCULAR CATHETERIZATION  11/04/2015   Procedure: Peripheral Vascular Atherectomy;  Surgeon: [name], MD;  Location: [facility];  Service: Cardiovascular;;  RT. SFA  . PERIPHERAL VASCULAR CATHETERIZATION Bilateral 11/04/2015   Procedure: Lower Extremity Intervention;  Surgeon: [name], MD;  Location: [facility];  Service: Cardiovascular;  Laterality: Bilateral;  . PERIPHERAL VASCULAR CATHETERIZATION  11/04/2015   Procedure: Peripheral Vascular Balloon Angioplasty;  Surgeon: [name], MD;  Location: [facility];  Service: Cardiovascular;;  TP Trunk  . TOE SURGERY Left    "put pin in 2nd toe"  . TUBAL LIGATION    . WRIST FRACTURE SURGERY Left    "grafted bone from hip to wrist"  reports that she quit smoking about 8 years ago. Her smoking use included cigarettes. She started smoking about 62 years ago. She has a 150.00 pack-year smoking history. She has never used smokeless tobacco. She reports current alcohol use. She reports current drug use. Drug: Marijuana.  Allergies  Allergen Reactions  . Other Shortness Of Breath, Rash and Other (See Comments)    CANNOT EAT ANY BERRIES!!  . Strawberry Extract Hives, Shortness Of Breath and Rash  . Sulfa Antibiotics Swelling and Other (See Comments)    Bodily Swelling  . Coffee Bean Extract [Coffea Arabica] Itching and Rash  . Tape Other (See Comments)    Tears skin.  Please use "paper" tape only.    Family History  Problem Relation Age of Onset  . Coronary artery disease Father        Died with MI age 58  . Heart disease Father        before age 26   . Heart attack Father   . Diabetes type II Mother   . Hypertension Mother   . Diabetes Mother   . Heart disease Mother   . Hyperlipidemia Mother   . Heart failure Sister   . Cancer Sister   . Cancer Brother        Lung cancer  . Diabetes Daughter   . Hyperlipidemia Daughter   . Diabetes Son   . Hyperlipidemia Son      Prior to Admission medications   Medication Sig Start Date End Date Taking? Authorizing Provider  aspirin 81 MG EC tablet Take 81 mg by mouth daily. 03/29/13  Yes Simmons, Brittainy M, PA-C  atorvastatin (LIPITOR) 40 MG tablet TAKE 1 TABLET BY MOUTH ONCE DAILY AT  6  PM Patient taking differently: Take 40 mg by mouth daily.  10/09/17  Yes Satira Sark, MD  citalopram (CELEXA) 20 MG tablet Take 20 mg by mouth daily.  05/20/15  Yes [provider]  clopidogrel (PLAVIX) 75 MG tablet TAKE 1 TABLET BY MOUTH ONCE DAILY WITH BREAKFAST Patient taking differently: Take 75 mg by mouth daily.  10/09/17  Yes Satira Sark, MD  docusate sodium (COLACE) 100 MG capsule Take 200 mg by mouth at bedtime as needed for mild constipation.   Yes [provider]  ferrous sulfate 325 (65 FE) MG tablet Take 325 mg by mouth 2 (two) times daily.   Yes [provider]  gabapentin (NEURONTIN) 100 MG capsule Take 200 mg by mouth every 12 (twelve) hours.   Yes [provider]  insulin detemir (LEVEMIR) 100 UNIT/ML injection Inject 10 Units into the skin daily with breakfast.   Yes [provider]  isosorbide mononitrate (IMDUR) 30 MG 24 hr tablet TAKE 1 TABLET BY MOUTH ONCE DAILY Patient taking differently: Take 30 mg by mouth daily.  10/22/17  Yes Lendon Colonel, NP  JANUVIA 25 MG tablet TAKE 1 TABLET BY MOUTH ONCE DAILY 03/22/17  Yes Nida, Marella Chimes, MD  levothyroxine (SYNTHROID, LEVOTHROID) 75 MCG tablet Take 1 tablet (75 mcg total) by mouth daily before breakfast. 04/04/17  Yes Johnson, Clanford L, MD  Multiple Vitamin (MULTIVITAMIN  WITH MINERALS) TABS tablet Take 1 tablet by mouth daily.   Yes [provider]  nitroGLYCERIN (NITROSTAT) 0.4 MG SL tablet Place 1 tablet (0.4 mg total) under the tongue every 5 (five) minutes as needed for chest pain. Up to 3 doses. If no relief after 3rd dose, proceed to the ED for an evaluation 09/30/14  Yes Satira Sark, MD  nystatin cream (MYCOSTATIN) Apply 1 application topically 2 (two) times daily.   Yes [provider]  oxyCODONE (OXY IR/ROXICODONE) 5 MG immediate release tablet Take 5 mg by mouth 4 (four) times daily as needed for severe pain.   Yes [provider]  potassium chloride SA (K-DUR,KLOR-CON) 20 MEQ tablet TAKE 1 TABLET BY MOUTH TWICE DAILY Patient taking differently: Take 20 mEq by mouth 2 (two) times daily.  10/09/17  Yes Satira Sark, MD  tiZANidine (ZANAFLEX) 2 MG tablet Take 2 mg by mouth every 8 (eight) hours as needed for muscle spasms.    Yes [provider]  torsemide (DEMADEX) 20 MG tablet Take 4 tablets (80 mg total) by mouth 2 (two) times daily. 10/22/17 01/25/18 Yes Herminio Commons, MD  traMADol (ULTRAM) 50 MG tablet Take 50 mg by mouth 3 (three) times daily.  08/28/16  Yes [provider]  acetaminophen (TYLENOL) 500 MG tablet Take 500 mg by mouth every 6 (six) hours as needed for mild pain or moderate pain.     [provider]    Physical Exam: Vitals:   01/25/18 1830 01/25/18 1930 01/25/18 2000 01/25/18 2030  BP: (!) 157/54 (!) 127/52 (!) 143/46 (!) 143/46  Pulse: (!) 49 (!) 49 (!) 46 (!) 52  Resp: 17 18 (!) 21 14  Temp:      TempSrc:      SpO2: 100% 98% 99% 98%    Constitutional: NAD, calm, chronically-ill appearing  Eyes: PERTLA, lids and conjunctivae normal ENMT: Mucous membranes are moist. Posterior pharynx clear of any exudate or lesions.   Neck: normal, supple, no masses, no thyromegaly Respiratory: clear to auscultation bilaterally, no wheezing, no crackles. Normal respiratory  effort.    Cardiovascular: Rate ~45 and regular. No significant JVD. Abdomen: No distension, no tenderness, soft. Bowel sounds active.  Musculoskeletal: no clubbing / cyanosis. Atrophic and darkened left great toe.    Skin: Discoloration of lower legs Lt >> Rt with bleeding wound on anterior left lower leg. Boggy and pale/cyanotic toes on left.  Neurologic: No gross facial asymmetr. Sensation intact. Moving all extremities.  Psychiatric: Alert and oriented to person, place, and situation. Pleasant, cooperative.    Labs on Admission: I have personally reviewed following labs and imaging studies  CBC: Recent Labs  Lab 01/25/18 1552  WBC 10.6*  NEUTROABS 9.0*  HGB 10.6*  HCT 36.3  MCV 85.0  PLT 177   Basic Metabolic Panel: Recent Labs  Lab 01/25/18 1552  NA 136  K 4.6  CL 99  CO2 25  GLUCOSE 185*  BUN 44*  CREATININE 2.02*  CALCIUM 8.8*   GFR: Estimated Creatinine Clearance: 29 mL/min (A) (by C-G formula based on SCr of 2.02 mg/dL (H)). Liver Function Tests: Recent Labs  Lab 01/25/18 1552  AST 27  ALT 19  ALKPHOS 83  BILITOT 0.5  PROT 8.0  ALBUMIN 2.9*   Recent Labs  Lab 01/25/18 1552  LIPASE 31   No results for input(s): AMMONIA in the last 168 hours. Coagulation Profile: No results for input(s): INR, PROTIME in the last 168 hours. Cardiac Enzymes: Recent Labs  Lab 01/25/18 1552 01/25/18 1806  CKTOTAL 51  --   TROPONINI  --  <0.03   BNP (last 3 results) No results for input(s): PROBNP in the last 8760 hours. HbA1C: No results for input(s): HGBA1C in the last 72 hours. CBG: No results for input(s): GLUCAP in the last 168 hours.  Lipid Profile: No results for input(s): CHOL, HDL, LDLCALC, TRIG, CHOLHDL, LDLDIRECT in the last 72 hours. Thyroid Function Tests: No results for input(s): TSH, T4TOTAL, FREET4, T3FREE, THYROIDAB in the last 72 hours. Anemia Panel: No results for input(s): VITAMINB12, FOLATE, FERRITIN, TIBC, IRON, RETICCTPCT in the last  72 hours. Urine analysis:    Component Value Date/Time   COLORURINE YELLOW 01/25/2018 2015   APPEARANCEUR HAZY (A) 01/25/2018 2015   LABSPEC 1.012 01/25/2018 2015   PHURINE 5.0 01/25/2018 2015   GLUCOSEU NEGATIVE 01/25/2018 2015   HGBUR MODERATE (A) 01/25/2018 2015   BILIRUBINUR NEGATIVE 01/25/2018 2015   KETONESUR NEGATIVE 01/25/2018 2015   PROTEINUR NEGATIVE 01/25/2018 2015   UROBILINOGEN 0.2 10/10/2014 2345   NITRITE NEGATIVE 01/25/2018 2015   LEUKOCYTESUR NEGATIVE 01/25/2018 2015   Sepsis Labs: _0 (procalcitonin:4,lacticidven:4) )No results found for this or any previous visit (from the past 240 hour(s)).   Radiological Exams on Admission: Ct Abdomen Pelvis Wo Contrast  Result Date: 01/25/2018 CLINICAL DATA:  Abdomen pain for 3 days. EXAM: CT ABDOMEN AND PELVIS WITHOUT CONTRAST TECHNIQUE: Multidetector CT imaging of the abdomen and pelvis was performed following the standard protocol without IV contrast. COMPARISON:  May 16, 2014 FINDINGS: Lower chest: Mild atelectasis of bilateral lung bases are noted. The heart size is enlarged. Hepatobiliary: No focal liver abnormality is seen. Status post cholecystectomy. No biliary dilatation. Pancreas: Unremarkable. No pancreatic ductal dilatation or surrounding inflammatory changes. Spleen: Normal in size without focal abnormality. Adrenals/Urinary Tract: Adrenal glands are unremarkable. Kidneys are normal, without renal calculi, focal lesion, or hydronephrosis. Bladder is unremarkable. Stomach/Bowel: Small hiatal hernia is identified. The stomach is otherwise normal. Prior appendectomy. No evidence of bowel wall thickening, distention, or inflammatory changes. Vascular/Lymphatic: Aortic atherosclerosis. No enlarged abdominal or pelvic lymph nodes. Reproductive: Status post hysterectomy. No adnexal masses. Other: Left inguinal herniation of mesenteric fat is identified. Minimal umbilical hernia of mesenteric fat is noted. Musculoskeletal:  Degenerative joint changes of the spine are identified. IMPRESSION: No acute abnormality identified in the abdomen and pelvis. Moderate bowel content is identified throughout colon. Electronically Signed   By: Abelardo Diesel M.D.   On: 01/25/2018 19:42   Dg Chest Port 1 View  Result Date: 01/25/2018 CLINICAL DATA:  73 year old female with weakness. Chest pain for 1 week. EXAM: PORTABLE CHEST 1 VIEW COMPARISON:  03/31/2017 and earlier. FINDINGS: Portable AP semi upright view at 1553 hours. Stable cardiomegaly and mediastinal contours. Prior CABG. Allowing for portable technique the lungs are clear. No pneumothorax. Visualized tracheal air column is within normal limits. IMPRESSION: No acute cardiopulmonary abnormality. Electronically Signed   By: Genevie Ann M.D.   On: 01/25/2018 16:11   Vas [place] Lower Extremity Arterial Duplex  Result Date: 01/25/2018 LOWER EXTREMITY ARTERIAL DUPLEX STUDY Indications: Ulceration of the left lower extremity calf., gangrene / discharge              of left toes.  Vascular Interventions: Left femoral to popliteal bypass graft 09/2013. Current ABI:            Not obtained Limitations: Patient unable to be properly positioned. Recent release from              nursing home. Comparison Study: [place] since prior exam of 02/23/2016 Performing Technologist: Alvia Grove RVT  Examination Guidelines: A complete evaluation includes B-mode imaging, spectral Doppler, color Doppler, and power Doppler as needed of all accessible portions of each vessel. Bilateral testing is considered an integral part of a complete examination. Limited examinations  for reoccurring indications may be performed as noted.  Left Graft #1: +--------------------+-------------+--------+----------+----------------------+                     PSV cm/s     StenosisWaveform  Comments               +--------------------+-------------+--------+----------+----------------------+ Inflow              137                   biphasic                         +--------------------+-------------+--------+----------+----------------------+ Proximal Anastomosis106                  biphasic                         +--------------------+-------------+--------+----------+----------------------+ Proximal Graft      40 / occludedoccluded          Prestenotic / occluded +--------------------+-------------+--------+----------+----------------------+ Mid Graft                        occluded                                 +--------------------+-------------+--------+----------+----------------------+ Distal Graft                     occluded                                 +--------------------+-------------+--------+----------+----------------------+ Distal Anastamosis               occluded                                 +--------------------+-------------+--------+----------+----------------------+ Outflow             37                   monophasicdampened               +--------------------+-------------+--------+----------+----------------------+  Summary: Left: No color flow or Doppler signal in the femoral to popliteal graft consistent with occlusion.  See table(s) above for measurements and observations. Electronically signed by Servando Snare MD on 01/25/2018 at 3:14:46 PM.    Final     EKG: Independently reviewed. Sinus bradycardia (rate 49), PVC, RAD.   Assessment/Plan   1. Left leg ischemia with toe gangrene and non-healing leg wound  - Hx of PAD s/p fem-pop bypass on left, presenting with discoloration of toes on left and non-healing left shin wound  - She was evaluated in vascular clinic with no color flow or doppler signal in the left fem-pop graft and was sent to ED  - Initial BP low and mild leukocytosis noted with normal lactate  - Blood cultures were collected, BP normalized with 1 liter NS, and she started on broad-spectrum empiric antibiotics  - Vascular surgery consulting and  much appreciated  - Continue empiric antibiotics, keep NPO after midnight pending surgeon's eval, follow cultures and clinical course    2. CAD   - No chest pain today, but reports a few episodes of transient chest discomfort recently  - EKG similar to priors with  sinus brady and no acute ST-T abnormalities - Troponin is undetectable  - Continue statin, hold ASA and Plavix initially for anticipated amputation    3. Chronic diastolic CHF  - Appears compensated  - Was given a liter of NS in ED d/t suspected foot infection and low intial BP  - BP has normalized and lactate normal, will SLIV now, hold diuretics initially, follow daily wt and I/O's    4. Insulin-dependent DM  - A1c was 6.2% in March 2019  - Managed at home with Januvia and Levemir 10 units qAM - Check CBG's and use SSI for now    5. Hypothyroidism  - Continue Synthroid   6. CKD stage IV  - SCr is 2.02 on admission, similar to priors  - Renally-dose medications    DVT prophylaxis: SCD's   Code Status: DNR, confirmed with patient and daughter  Family Communication: Daughter updated at bedside Consults called: Vascular surgery Admission status: Admission     Vianne Bulls, MD [facility] Pager [phone]  If 7PM-7AM, please contact night-coverage [email or web address] Password [facility]  01/25/2018, 9:12 PM

## 2018-01-25 NOTE — ED Notes (Signed)
Assisted patient with bedpan; pt is non-ambulatory

## 2018-01-25 NOTE — Patient Instructions (Signed)
Peripheral Vascular Disease  Peripheral vascular disease (PVD) is a disease of the blood vessels that are not part of your heart and brain. A simple term for PVD is poor circulation. In most cases, PVD narrows the blood vessels that carry blood from your heart to the rest of your body. This can reduce the supply of blood to your arms, legs, and internal organs, like your stomach or kidneys. However, PVD most often affects a person's lower legs and feet. Without treatment, PVD tends to get worse. PVD can also lead to acute ischemic limb. This is when an arm or leg suddenly cannot get enough blood. This is a medical emergency. Follow these instructions at home: Lifestyle  Do not use any products that contain nicotine or tobacco, such as cigarettes and e-cigarettes. If you need help quitting, ask your doctor.  Lose weight if you are overweight. Or, stay at a healthy weight as told by your doctor.  Eat a diet that is low in fat and cholesterol. If you need help, ask your doctor.  Exercise regularly. Ask your doctor for activities that are right for you. General instructions  Take over-the-counter and prescription medicines only as told by your doctor.  Take good care of your feet: ? Wear comfortable shoes that fit well. ? Check your feet often for any cuts or sores.  Keep all follow-up visits as told by your doctor This is important. Contact a doctor if:  You have cramps in your legs when you walk.  You have leg pain when you are at rest.  You have coldness in a leg or foot.  Your skin changes.  You are unable to get or have an erection (erectile dysfunction).  You have cuts or sores on your feet that do not heal. Get help right away if:  Your arm or leg turns cold, numb, and blue.  Your arms or legs become red, warm, swollen, painful, or numb.  You have chest pain.  You have trouble breathing.  You suddenly have weakness in your face, arm, or leg.  You become very  confused or you cannot speak.  You suddenly have a very bad headache.  You suddenly cannot see. Summary  Peripheral vascular disease (PVD) is a disease of the blood vessels.  A simple term for PVD is poor circulation. Without treatment, PVD tends to get worse.  Treatment may include exercise, low fat and low cholesterol diet, and quitting smoking. This information is not intended to replace advice given to you by your health care provider. Make sure you discuss any questions you have with your health care provider. Document Released: 03/22/2009 Document Revised: 02/03/2016 Document Reviewed: 02/03/2016 Elsevier Interactive Patient Education  2019 [employer].

## 2018-01-26 ENCOUNTER — Other Ambulatory Visit: Payer: Self-pay

## 2018-01-26 DIAGNOSIS — L89152 Pressure ulcer of sacral region, stage 2: Secondary | ICD-10-CM

## 2018-01-26 DIAGNOSIS — I96 Gangrene, not elsewhere classified: Secondary | ICD-10-CM

## 2018-01-26 LAB — CBC WITH DIFFERENTIAL/PLATELET
Abs Immature Granulocytes: 0.03 10*3/uL (ref 0.00–0.07)
Basophils Absolute: 0 10*3/uL (ref 0.0–0.1)
Basophils Relative: 1 %
EOS ABS: 0.1 10*3/uL (ref 0.0–0.5)
Eosinophils Relative: 2 %
HCT: 33 % — ABNORMAL LOW (ref 36.0–46.0)
Hemoglobin: 9.3 g/dL — ABNORMAL LOW (ref 12.0–15.0)
Immature Granulocytes: 1 %
Lymphocytes Relative: 10 %
Lymphs Abs: 0.6 10*3/uL — ABNORMAL LOW (ref 0.7–4.0)
MCH: 23.9 pg — AB (ref 26.0–34.0)
MCHC: 28.2 g/dL — AB (ref 30.0–36.0)
MCV: 84.8 fL (ref 80.0–100.0)
Monocytes Absolute: 0.5 10*3/uL (ref 0.1–1.0)
Monocytes Relative: 8 %
Neutro Abs: 5.3 10*3/uL (ref 1.7–7.7)
Neutrophils Relative %: 78 %
Platelets: 182 10*3/uL (ref 150–400)
RBC: 3.89 MIL/uL (ref 3.87–5.11)
RDW: 19.9 % — ABNORMAL HIGH (ref 11.5–15.5)
WBC: 6.6 10*3/uL (ref 4.0–10.5)
nRBC: 0 % (ref 0.0–0.2)

## 2018-01-26 LAB — HIV ANTIBODY (ROUTINE TESTING W REFLEX): HIV Screen 4th Generation wRfx: NONREACTIVE

## 2018-01-26 LAB — GLUCOSE, CAPILLARY
Glucose-Capillary: 141 mg/dL — ABNORMAL HIGH (ref 70–99)
Glucose-Capillary: 117 mg/dL — ABNORMAL HIGH (ref 70–99)
Glucose-Capillary: 187 mg/dL — ABNORMAL HIGH (ref 70–99)
Glucose-Capillary: 190 mg/dL — ABNORMAL HIGH (ref 70–99)

## 2018-01-26 LAB — BASIC METABOLIC PANEL
Anion gap: 10 (ref 5–15)
BUN: 42 mg/dL — AB (ref 8–23)
CALCIUM: 8.5 mg/dL — AB (ref 8.9–10.3)
CO2: 21 mmol/L — ABNORMAL LOW (ref 22–32)
CREATININE: 1.59 mg/dL — AB (ref 0.44–1.00)
Chloride: 106 mmol/L (ref 98–111)
GFR calc Af Amer: 38 mL/min — ABNORMAL LOW (ref >60)
GFR calc non Af Amer: 33 mL/min — ABNORMAL LOW (ref >60)
Glucose, Bld: 144 mg/dL — ABNORMAL HIGH (ref 70–99)
Potassium: 4.7 mmol/L (ref 3.5–5.1)
Sodium: 137 mmol/L (ref 135–145)

## 2018-01-26 LAB — MRSA PCR SCREENING: MRSA by PCR: NEGATIVE

## 2018-01-26 MED ORDER — ADULT MULTIVITAMIN W/MINERALS CH
1.0000 | ORAL_TABLET | Freq: Every day | ORAL | Status: DC
  Administered 2018-01-26 – 2018-02-01 (×6): 1 via ORAL
  Filled 2018-01-26 (×6): qty 1

## 2018-01-26 MED ORDER — VANCOMYCIN HCL 10 G IV SOLR
1250.0000 mg | INTRAVENOUS | Status: DC
  Filled 2018-01-26: qty 1250

## 2018-01-26 MED ORDER — PRO-STAT SUGAR FREE PO LIQD
30.0000 mL | Freq: Two times a day (BID) | ORAL | Status: DC
  Administered 2018-01-26 – 2018-01-31 (×11): 30 mL via ORAL
  Filled 2018-01-26 (×10): qty 30

## 2018-01-26 NOTE — Progress Notes (Addendum)
  Progress Note    01/26/2018 10:05 AM Hospital Day 1  Subjective:  No complaints  afebrile  Vitals:   01/25/18 2227 01/26/18 0436  BP: (!) 144/43 (!) 152/57  Pulse: (!) 50 (!) 48  Resp:    Temp: 98.1 F (36.7 C) 98.1 F (36.7 C)  SpO2: 98% 94%    Physical Exam: General:  No distress Lungs:  Non labored Extremities:  Left leg with extensive wounds and gangrenous toes.  CBC    Component Value Date/Time   WBC 6.6 01/26/2018 0418   RBC 3.89 01/26/2018 0418   HGB 9.3 (L) 01/26/2018 0418   HCT 33.0 (L) 01/26/2018 0418   PLT 182 01/26/2018 0418   MCV 84.8 01/26/2018 0418   MCH 23.9 (L) 01/26/2018 0418   MCHC 28.2 (L) 01/26/2018 0418   RDW 19.9 (H) 01/26/2018 0418   LYMPHSABS 0.6 (L) 01/26/2018 0418   MONOABS 0.5 01/26/2018 0418   EOSABS 0.1 01/26/2018 0418   BASOSABS 0.0 01/26/2018 0418    BMET    Component Value Date/Time   NA 137 01/26/2018 0418   K 4.7 01/26/2018 0418   CL 106 01/26/2018 0418   CO2 21 (L) 01/26/2018 0418   GLUCOSE 144 (H) 01/26/2018 0418   BUN 42 (H) 01/26/2018 0418   CREATININE 1.59 (H) 01/26/2018 0418   CREATININE 1.62 (H) 10/02/2016 1125   CALCIUM 8.5 (L) 01/26/2018 0418   GFRNONAA 33 (L) 01/26/2018 0418   GFRAA 38 (L) 01/26/2018 0418    INR    Component Value Date/Time   INR 1.31 11/04/2015 1031     Intake/Output Summary (Last 24 hours) at 01/26/2018 1005 Last data filed at 01/26/2018 0900 Gross per 24 hour  Intake 1559.59 ml  Output -  Net 1559.59 ml     Assessment/Plan:  73 y.o. female with critical limb ischemia left leg Hospital Day 1  -pt has an occluded bypass graft left leg.  Given the extensive nature of her wounds, this is not acute.  Her leg is not salvageable and will need an amputation.  Pt understands this and if she does not get an amputation, these wounds can become infected and be life threatening.   Will plan amputation for next week. -continue wound care and IV abx  Leontine Locket, PA-C [facility] [phone] 01/26/2018 10:05 AM   I agree with the above.  I have seen and evaluated the patient.  I do not think that she has a salvageable left leg.  I discussed this with the patient.  She needs an above-knee amputation.  This will be scheduled for early next week.  Her dressing was changed today.  Continue with local wound care and IV antibiotics.  Annamarie Major

## 2018-01-26 NOTE — Progress Notes (Signed)
Inpatient Diabetes Program Recommendations  AACE/ADA: New Consensus Statement on Inpatient Glycemic Control  Target Ranges:  Prepandial:   less than 140 mg/dL      Peak postprandial:   less than 180 mg/dL (1-2 hours)      Critically ill patients:  140 - 180 mg/dL   Results for [name], [name] (MRN [id number]) as of 01/26/2018 12:35  Ref. Range 01/25/2018 22:57 01/26/2018 04:43 01/26/2018 11:41  Glucose-Capillary Latest Ref Range: 70 - 99 mg/dL 130 (H) 141 (H) 117 (H)   Results for [name] [name], [name] [name] (MRN [id number]) as of 01/26/2018 12:35  Ref. Range 03/31/2017 13:33  Hemoglobin A1C Latest Ref Range: 4.8 - 5.6 % 6.2 (H)   Review of Glycemic Control  Diabetes history: DM2 Outpatient Diabetes medications: Levemir 10 units QAM, Januvia 25 mg daily Current orders for Inpatient glycemic control: Novolog 0-9 units Q4H  Inpatient Diabetes Program Recommendations:  HbgA1C: Please consider ordering an A1C to evaluate glycemic control over the past 2-3 months.  NOTE: Noted consult for Diabetes Coordinator. Diabetes Coordinator is not on campus over the weekend but available by pager from 8am to 5pm for questions or concerns. Chart reviewed. Last A1C 6.2% on 03/31/17. Agree with current inpatient order for glycemic control. Will continue to follow.  Thanks, Barnie Alderman, RN, MSN, CDE Diabetes Coordinator Inpatient Diabetes Program [phone] (Team Pager from 8am to 5pm)

## 2018-01-26 NOTE — Progress Notes (Signed)
[facility] PROGRESS NOTE    Progress Note  [name] [name]  JKK:[id number] DOB: 26-Aug-1945 DOA: 01/25/2018 PCP: Glenda Chroman, MD     Brief Narrative:   [name] [name] is an 73 y.o. female past medical history of peripheral vascular disease status post left femoral-popliteal bypass, Peripheral Vascular Atherectomy 11/04/15,CAD status post CABG, chronic diastolic heart failure, chronic kidney disease stage IV hypothyroidism insulin-dependent diabetes mellitus presents to the emergency room for evaluation of discoloration of her left foot and nonhealing left shin ulcer  Assessment/Plan:   Critical ischemia of extremity with history of revascularization of same extremity/left toe gangrene: With a history of PAD status post femoropopliteal on the left. Blood cultures were collected in the ED, patient was fluid resuscitated with a liter of normal saline. She will start empirically on broad-spectrum antibiotics, vancomycin and cefepime. He has remained afebrile, with no leukocytosis. Awaiting vascular recommendations. The patient is currently n.p.o.  Chronic diastolic heart failure: Appears to be compensated. Agree with holding diuretics. KVO IV fluids, keep the patient n.p.o.  CAD: Denies any chest pain.  EKG was unrevealing. Cardiac biomarkers were undetectable. Continue statins, aspirin and Plavix have been held for anticipated amputation.  Insulin-dependent diabetes mellitus type 2: With an A1c of 6.2 back in March 2019. He is currently n.p.o. continue sliding scale and CBGs every 4.  Hypothyroidism: Continue Synthroid.  Chronic kidney disease stage IV: Serum creatinine 2.0 her baseline is around 1.5-1.7.  Sacral decubitus ulcer, stage II ([place]) We will consult wound for sacral decubitus ulcer. RN Pressure Injury Documentation: Pressure Injury 03/31/17 Stage I -  Intact skin with non-blanchable redness of a localized area usually over a bony prominence. (Active)    03/31/17 1637  Location: Buttocks  Location Orientation: Right  Staging: Stage I -  Intact skin with non-blanchable redness of a localized area usually over a bony prominence.  Wound Description (Comments):   Present on Admission: Yes     Pressure Injury 01/25/18 Stage I -  Intact skin with non-blanchable redness of a localized area usually over a bony prominence. (Active)  01/25/18 2300  Location: Sacrum  Location Orientation:   Staging: Stage I -  Intact skin with non-blanchable redness of a localized area usually over a bony prominence.  Wound Description (Comments):   Present on Admission: Yes     DVT prophylaxis: lovexno Family Communication:none Disposition Plan/Barrier to D/C: unable to determine Code Status:     Code Status Orders  (From admission, onward)         Start     Ordered   01/25/18 2110  Do not attempt resuscitation (DNR)  Continuous    Question Answer Comment  In the event of cardiac or respiratory ARREST Do not call a "code blue"   In the event of cardiac or respiratory ARREST Do not perform Intubation, CPR, defibrillation or ACLS   In the event of cardiac or respiratory ARREST Use medication by any route, position, wound care, and other measures to relive pain and suffering. May use oxygen, suction and manual treatment of airway obstruction as needed for comfort.      01/25/18 2111        Code Status History    Date Active Date Inactive Code Status Order ID Comments User Context   03/31/2017 1748 04/04/2017 1508 Full Code [id number]  Erline Hau, MD Inpatient   07/28/2016 1538 08/03/2016 2104 Full Code [id number]  Leanor Kail, [place] Inpatient   11/04/2015 1459 11/05/2015 1629 Full  Code [id number]  [name], MD Inpatient   09/14/2015 1627 09/21/2015 1852 Full Code [id number]  Conrad [place], NP Inpatient   07/23/2015 2135 08/01/2015 1716 DNR [id number]  Phillips Grout, MD Inpatient   10/11/2014 0431 10/19/2014 1635 DNR [id number]   Phillips Grout, MD Inpatient   09/03/2014 2334 09/12/2014 1701 DNR [id number]  Etta Quill, DO Inpatient   09/03/2014 2334 09/03/2014 2334 Full Code [id number]  Etta Quill, DO Inpatient   10/07/2013 1349 10/10/2013 1323 Full Code [id number]  Ulyses Amor, PA-C Inpatient   10/06/2013 1252 10/07/2013 1349 Full Code [id number]  [name], MD Inpatient   09/08/2013 1133 09/08/2013 1943 Full Code [id number]  [name], MD Inpatient   03/27/2013 1558 03/29/2013 1449 Full Code [id number]  Burnell Blanks, MD Inpatient   03/18/2013 1626 03/27/2013 1558 Full Code [id number]  Rogelia Mire, NP Inpatient   11/24/2010 1354 11/29/2010 1732 Full Code [id number]  Leandra Kern, RN Inpatient        IV Access:    Peripheral IV   Procedures and diagnostic studies:   Ct Abdomen Pelvis Wo Contrast  Result Date: 01/25/2018 CLINICAL DATA:  Abdomen pain for 3 days. EXAM: CT ABDOMEN AND PELVIS WITHOUT CONTRAST TECHNIQUE: Multidetector CT imaging of the abdomen and pelvis was performed following the standard protocol without IV contrast. COMPARISON:  May 16, 2014 FINDINGS: Lower chest: Mild atelectasis of bilateral lung bases are noted. The heart size is enlarged. Hepatobiliary: No focal liver abnormality is seen. Status post cholecystectomy. No biliary dilatation. Pancreas: Unremarkable. No pancreatic ductal dilatation or surrounding inflammatory changes. Spleen: Normal in size without focal abnormality. Adrenals/Urinary Tract: Adrenal glands are unremarkable. Kidneys are normal, without renal calculi, focal lesion, or hydronephrosis. Bladder is unremarkable. Stomach/Bowel: Small hiatal hernia is identified. The stomach is otherwise normal. Prior appendectomy. No evidence of bowel wall thickening, distention, or inflammatory changes. Vascular/Lymphatic: Aortic atherosclerosis. No enlarged abdominal or pelvic lymph nodes. Reproductive: Status post hysterectomy. No adnexal masses.  Other: Left inguinal herniation of mesenteric fat is identified. Minimal umbilical hernia of mesenteric fat is noted. Musculoskeletal: Degenerative joint changes of the spine are identified. IMPRESSION: No acute abnormality identified in the abdomen and pelvis. Moderate bowel content is identified throughout colon. Electronically Signed   By: Abelardo Diesel M.D.   On: 01/25/2018 19:42   Dg Chest Port 1 View  Result Date: 01/25/2018 CLINICAL DATA:  73 year old female with weakness. Chest pain for 1 week. EXAM: PORTABLE CHEST 1 VIEW COMPARISON:  03/31/2017 and earlier. FINDINGS: Portable AP semi upright view at 1553 hours. Stable cardiomegaly and mediastinal contours. Prior CABG. Allowing for portable technique the lungs are clear. No pneumothorax. Visualized tracheal air column is within normal limits. IMPRESSION: No acute cardiopulmonary abnormality. Electronically Signed   By: Genevie Ann M.D.   On: 01/25/2018 16:11   Vas [place] Lower Extremity Arterial Duplex  Result Date: 01/25/2018 LOWER EXTREMITY ARTERIAL DUPLEX STUDY Indications: Ulceration of the left lower extremity calf., gangrene / discharge              of left toes.  Vascular Interventions: Left femoral to popliteal bypass graft 09/2013. Current ABI:            Not obtained Limitations: Patient unable to be properly positioned. Recent release from              nursing home. Comparison Study: Occluded since prior exam of 02/23/2016 Performing Technologist: Alvia Grove RVT  Examination Guidelines: A  complete evaluation includes B-mode imaging, spectral Doppler, color Doppler, and power Doppler as needed of all accessible portions of each vessel. Bilateral testing is considered an integral part of a complete examination. Limited examinations for reoccurring indications may be performed as noted.  Left Graft #1: +--------------------+-------------+--------+----------+----------------------+                     PSV cm/s     StenosisWaveform  Comments                +--------------------+-------------+--------+----------+----------------------+ Inflow              137                  biphasic                         +--------------------+-------------+--------+----------+----------------------+ Proximal Anastomosis106                  biphasic                         +--------------------+-------------+--------+----------+----------------------+ Proximal Graft      40 / occludedoccluded          Prestenotic / occluded +--------------------+-------------+--------+----------+----------------------+ Mid Graft                        occluded                                 +--------------------+-------------+--------+----------+----------------------+ Distal Graft                     occluded                                 +--------------------+-------------+--------+----------+----------------------+ Distal Anastamosis               occluded                                 +--------------------+-------------+--------+----------+----------------------+ Outflow             37                   monophasicdampened               +--------------------+-------------+--------+----------+----------------------+  Summary: Left: No color flow or Doppler signal in the femoral to popliteal graft consistent with occlusion.  See table(s) above for measurements and observations. Electronically signed by Servando Snare MD on 01/25/2018 at 3:14:46 PM.    Final      Medical Consultants:    None.  Anti-Infectives:   IV vancomycin and cefepime.  Subjective:    Latronda Spink Herbig she relates no pain  Objective:    Vitals:   01/25/18 2030 01/25/18 2100 01/25/18 2227 01/26/18 0436  BP: (!) 143/46 (!) 152/38 (!) 144/43 (!) 152/57  Pulse: (!) 52 (!) 48 (!) 50 (!) 48  Resp: 14 19    Temp:   98.1 F (36.7 C) 98.1 F (36.7 C)  TempSrc:   Oral Oral  SpO2: 98% 99% 98% 94%    Intake/Output Summary (Last 24 hours) at 01/26/2018 0744 Last  data filed at 01/26/2018 0300 Gross per 24 hour  Intake 1559.59 ml  Output -  Net  1559.59 ml   There were no vitals filed for this visit.  Exam: General exam: Chronically ill-appearing Respiratory system: Good air movement and clear to auscultation. Cardiovascular system: S1 & S2 heard, RRR. No JVD, murmurs, rubs, gallops or clicks.  Gastrointestinal system: Abdomen is nondistended, soft and nontender.  Central nervous system: Alert and oriented. No focal neurological deficits. Extremities: No pedal edema. Skin: Atrophic and darted left great toe, with discoloration of her legs bilaterally with bleeding on the left more than on the right.,  Appears cyanotic cyanotic Psychiatry: Judgement and insight appear normal. Mood & affect appropriate.    Data Reviewed:    Labs: Basic Metabolic Panel: Recent Labs  Lab 01/25/18 1552 01/26/18 0418  NA 136 137  K 4.6 4.7  CL 99 106  CO2 25 21*  GLUCOSE 185* 144*  BUN 44* 42*  CREATININE 2.02* 1.59*  CALCIUM 8.8* 8.5*   GFR Estimated Creatinine Clearance: 36.9 mL/min (A) (by C-G formula based on SCr of 1.59 mg/dL (H)). Liver Function Tests: Recent Labs  Lab 01/25/18 1552  AST 27  ALT 19  ALKPHOS 83  BILITOT 0.5  PROT 8.0  ALBUMIN 2.9*   Recent Labs  Lab 01/25/18 1552  LIPASE 31   No results for input(s): AMMONIA in the last 168 hours. Coagulation profile No results for input(s): INR, PROTIME in the last 168 hours.  CBC: Recent Labs  Lab 01/25/18 1552 01/26/18 0418  WBC 10.6* 6.6  NEUTROABS 9.0* 5.3  HGB 10.6* 9.3*  HCT 36.3 33.0*  MCV 85.0 84.8  PLT 275 182   Cardiac Enzymes: Recent Labs  Lab 01/25/18 1552 01/25/18 1806  CKTOTAL 51  --   TROPONINI  --  <0.03   BNP (last 3 results) No results for input(s): PROBNP in the last 8760 hours. CBG: Recent Labs  Lab 01/25/18 2257 01/26/18 0443  GLUCAP 130* 141*   D-Dimer: No results for input(s): DDIMER in the last 72 hours. Hgb A1c: No results for  input(s): HGBA1C in the last 72 hours. Lipid Profile: No results for input(s): CHOL, HDL, LDLCALC, TRIG, CHOLHDL, LDLDIRECT in the last 72 hours. Thyroid function studies: No results for input(s): TSH, T4TOTAL, T3FREE, THYROIDAB in the last 72 hours.  Invalid input(s): FREET3 Anemia work up: No results for input(s): VITAMINB12, FOLATE, FERRITIN, TIBC, IRON, RETICCTPCT in the last 72 hours. Sepsis Labs: Recent Labs  Lab 01/25/18 1552 01/25/18 1559 01/25/18 1816 01/26/18 0418  WBC 10.6*  --   --  6.6  LATICACIDVEN  --  1.67 <0.30*  --    Microbiology Recent Results (from the past 240 hour(s))  MRSA PCR Screening     Status: None   Collection Time: 01/26/18  2:50 AM  Result Value Ref Range Status   MRSA by PCR NEGATIVE NEGATIVE Final    Comment:        The GeneXpert MRSA Assay (FDA approved for NASAL specimens only), is one component of a comprehensive MRSA colonization surveillance program. It is not intended to diagnose MRSA infection nor to guide or monitor treatment for MRSA infections. Performed at [facility], [place] [address], [place], [place] [address]      Medications:   . atorvastatin  40 mg Oral q1800  . citalopram  20 mg Oral Daily  . gabapentin  200 mg Oral Q12H  . insulin aspart  0-9 Units Subcutaneous Q4H  . isosorbide mononitrate  30 mg Oral Daily  . levothyroxine  75 mcg Oral QAC breakfast  .  sodium chloride flush  3 mL Intravenous Q12H  . sodium chloride flush  3 mL Intravenous Q12H   Continuous Infusions: . sodium chloride 10 mL/hr at 01/26/18 0300  . ceFEPime (MAXIPIME) IV    . metronidazole Stopped (01/26/18 0106)  . [START ON 01/27/2018] vancomycin       LOS: 1 day   Charlynne Cousins  [facility]   *Please refer to [email or web address], password [email or web address] to get updated schedule on who will round on this patient, as hospitalists switch teams weekly. If 7PM-7AM, please contact night-coverage at [email or web address], password [email or web address] for any  overnight needs.  01/26/2018, 7:44 AM

## 2018-01-26 NOTE — Progress Notes (Signed)
Initial Nutrition Assessment  DOCUMENTATION CODES:   Obesity unspecified  INTERVENTION:   - Pro-stat 30 ml BID, each supplement provides 100 kcal and 15 grams of protein  - MVI with minerals daily  NUTRITION DIAGNOSIS:   Increased nutrient needs related to wound healing as evidenced by estimated needs.  GOAL:   Patient will meet greater than or equal to 90% of their needs  MONITOR:   PO intake, Supplement acceptance, Skin, Labs, Weight trends  REASON FOR ASSESSMENT:   Consult Wound healing  ASSESSMENT:   73 year old female who presented to the ED on 1/17 from her vein specialist for evaluation of gangrene to L foot and chest pain. PMH significant for T2DM, dementia, left femoral popliteal bypass in September 2019, CKD stage IV, CAD s/p CABG, CHF, HTN.  Noted plan for L AKA early next week.  Spoke with pt at bedside who reports having a good appetite. Pt states that she eats 3 meals daily.  Breakfast: oatmeal, toast, hot tea Lunch: egg salad sandwich with fries on occasion Dinner: whatever daughter-in-law fixes  Pt endorses weight loss and reports her UBW as 198 lbs. Pt states that she last weighed this in November 2019. No weight this admission. Last recorded weight was 88.5 kg on 01/25/18. Pt's weight has trended down since 04/18/17 with a 4 kg weight loss overall. This is a 4.3% weight loss which is not significant for timeframe.  Pt states that she does not like oral nutrition supplements but is willing to try Pro-stat to meet protein needs.  Pt endorses experiencing constipation at the [facility] where she resides. Pt states that this is due to her taking an iron supplement. Pt states that she does take a MVI.  Pt denies any issues chewing or swallowing. Pt denies N/V/D.  Medications reviewed and include: SSI, IV antibiotics, NS bolus  Labs reviewed: BUN 42 (H), creatinine 1.59 (H), hemoglobin 9.3 (L) CBG's: 141, 130  NUTRITION - FOCUSED PHYSICAL EXAM:    Most  Recent Value  Orbital Region  Mild depletion  Upper Arm Region  Mild depletion  Thoracic and Lumbar Region  No depletion  Buccal Region  No depletion  Temple Region  No depletion  Clavicle Bone Region  Mild depletion  Clavicle and Acromion Bone Region  Mild depletion  Scapular Bone Region  Unable to assess  Dorsal Hand  No depletion  Patellar Region  Mild depletion  Anterior Thigh Region  Mild depletion  Posterior Calf Region  Mild depletion  Edema (RD Assessment)  None  Hair  Reviewed  Eyes  Reviewed  Mouth  Reviewed  Skin  Reviewed  Nails  Reviewed       Diet Order:   Diet Order            Diet Carb Modified Fluid consistency: Thin; Room service appropriate? Yes  Diet effective now              EDUCATION NEEDS:   No education needs have been identified at this time  Skin:  Skin Assessment: Skin Integrity Issues: Skin Integrity Issues: Stage I: sacrum, R buttocks Other: gangrene to L toe  Last BM:  PTA/unknown  Height:   Ht Readings from Last 1 Encounters:  01/25/18 _0  (1.676 m)    Weight:   Wt Readings from Last 1 Encounters:  01/25/18 88.5 kg    Ideal Body Weight:  59.1 kg  BMI:  31.5 kg/m^2  Estimated Nutritional Needs:   Kcal:  1600-1800  Protein:  95-110 grams  Fluid:  1.6-1.8 L    Gaynell Face, MS, RD, LDN Inpatient Clinical Dietitian Pager: [phone] Weekend/After Hours: [phone]

## 2018-01-26 NOTE — Progress Notes (Signed)
Telemetry called and reported patient is experiencing bigeminy with PVC's she states she feels fine informed on call physician via text message

## 2018-01-26 NOTE — Progress Notes (Signed)
Pharmacy Antibiotic Note  ADALIS GATTI is a 73 y.o. female admitted on 01/25/2018 with cellulitis. Pharmacy has been consulted for vancomycin and cefepime dosing. Pt is afebrile and WBC is minimally elevated.   SCr has improved to 1.59 with estimated CrCl ~ 37 ml/min.  Lactic acid has resolved.   Plan: Adjust Vancomycin to 1250 mg IV to every 48 hours.  Continue Cefepime  1 gm IV Q24H F/u renal fxn, C&S, clinical status and peak/trough at [facility] Follow-up amputation post-operative plans     Temp (24hrs), Avg:97.7 F (36.5 C), Min:97 F (36.1 C), Max:98.1 F (36.7 C)  Recent Labs  Lab 01/25/18 1552 01/25/18 1559 01/25/18 1816 01/26/18 0418  WBC 10.6*  --   --  6.6  CREATININE 2.02*  --   --  1.59*  LATICACIDVEN  --  1.67 <0.30*  --     Estimated Creatinine Clearance: 36.9 mL/min (A) (by C-G formula based on SCr of 1.59 mg/dL (H)).    Allergies  Allergen Reactions  . Other Shortness Of Breath, Rash and Other (See Comments)    CANNOT EAT ANY BERRIES!!  . Strawberry Extract Hives, Shortness Of Breath and Rash  . Sulfa Antibiotics Swelling and Other (See Comments)    Bodily Swelling  . Coffee Bean Extract [Coffea Arabica] Itching and Rash  . Tape Other (See Comments)    Tears skin.  Please use "paper" tape only.    Antimicrobials this admission: Vanc 1/17>> Cefepime 1/17>> Flagyl 1/17>>  Dose adjustments this admission: 1/18 Adjust vanc from 1g q48 to 1250 q48 for improved renal function  Microbiology results: Pending  Thank you for allowing pharmacy to be a part of this patient's care.  Brain Hilts 01/26/2018 1:17 PM

## 2018-01-27 DIAGNOSIS — N179 Acute kidney failure, unspecified: Secondary | ICD-10-CM

## 2018-01-27 LAB — GLUCOSE, CAPILLARY
Glucose-Capillary: 135 mg/dL — ABNORMAL HIGH (ref 70–99)
Glucose-Capillary: 146 mg/dL — ABNORMAL HIGH (ref 70–99)
Glucose-Capillary: 148 mg/dL — ABNORMAL HIGH (ref 70–99)
Glucose-Capillary: 170 mg/dL — ABNORMAL HIGH (ref 70–99)
Glucose-Capillary: 174 mg/dL — ABNORMAL HIGH (ref 70–99)
Glucose-Capillary: 186 mg/dL — ABNORMAL HIGH (ref 70–99)

## 2018-01-27 MED ORDER — SODIUM CHLORIDE 0.9 % IV SOLN
2.0000 g | Freq: Two times a day (BID) | INTRAVENOUS | Status: DC
  Administered 2018-01-27 – 2018-01-30 (×6): 2 g via INTRAVENOUS
  Filled 2018-01-27 (×7): qty 2

## 2018-01-27 MED ORDER — VANCOMYCIN HCL IN DEXTROSE 1-5 GM/200ML-% IV SOLN
1000.0000 mg | INTRAVENOUS | Status: AC
  Administered 2018-01-27 – 2018-01-30 (×4): 1000 mg via INTRAVENOUS
  Filled 2018-01-27 (×4): qty 200

## 2018-01-27 NOTE — Consult Note (Signed)
[place] Nurse wound consult note Reason for Consult: LLE with critical limb ischemia, open wounds, nonviable hallux, RLE with increased risk for pressure injury Wound type: arterial insufficiency Pressure Injury POA: NA Measurement: anterior LLE with 10cm x 5cm x 0.2cm full thickness wound, yellow and red wound bed and black, nonviable hallux. RLE with scattered areas of dried serum, blanching heel. Wound bed:As noted above Drainage (amount, consistency, odor) small amount from RLE, none from LLE Periwound: As described above Dressing procedure/placement/frequency: Plan is for [facility] surgeon to amputate the patient's left LE above the knee early this week.  In the meantime, I have provided conservative topical care orders. The RLE will require a pressure redistribution heel boot.  Overall, the patient will require a pressure redistribution mattress with low air loss feature. POC communicated to Nursing.  [place] nursing team will not follow, but will remain available to this patient, the nursing and medical teams.  Please re-consult if needed. Thanks, Maudie Flakes, MSN, RN, [place], Arther Abbott  Pager# [phone]

## 2018-01-27 NOTE — Progress Notes (Signed)
[facility] PROGRESS NOTE    Progress Note  [name] [name]  RAQ:[id number] DOB: Aug 12, 1945 DOA: 01/25/2018 PCP: Glenda Chroman, MD     Brief Narrative:   [name] [name] is an 73 y.o. female past medical history of peripheral vascular disease status post left femoral-popliteal bypass, Peripheral Vascular Atherectomy 11/04/15,CAD status post CABG, chronic diastolic heart failure, chronic kidney disease stage IV hypothyroidism insulin-dependent diabetes mellitus presents to the emergency room for evaluation of discoloration of her left foot and nonhealing left shin ulcer  Assessment/Plan:   Critical ischemia of extremity with history of revascularization of same extremity/left toe gangrene: With a history of PAD status post femoropopliteal on the left. Continue empiric antibiotics. Surgery was consulted they recommended below the knee amputation as leg is not salvageable scheduled for early next week. He has remained afebrile, with no leukocytosis.  Chronic diastolic heart failure: Appears to be compensated. Agree with holding diuretics. KVO IV fluids, keep the patient n.p.o.  CAD: Denies any chest pain.  EKG was unrevealing. Cardiac biomarkers were undetectable. Continue statins, aspirin and Plavix have been held for anticipated amputation resume after surgical intervention.  Insulin-dependent diabetes mellitus type 2: With an A1c of 6.2 back in March 2019. He is currently n.p.o. continue sliding scale and CBGs every 4.  Hypothyroidism: Continue Synthroid.  Acute kidney injury chronic kidney disease stage IV: Improve with IV fluid hydration likely prerenal azotemia. Recheck a basic metabolic panel tomorrow morning.  Sacral decubitus ulcer, stage II ([place]) We will consult wound for sacral decubitus ulcer. RN Pressure Injury Documentation: Pressure Injury 03/31/17 Stage I -  Intact skin with non-blanchable redness of a localized area usually over a bony prominence. (Active)   03/31/17 1637  Location: Buttocks  Location Orientation: Right  Staging: Stage I -  Intact skin with non-blanchable redness of a localized area usually over a bony prominence.  Wound Description (Comments):   Present on Admission: Yes     Pressure Injury 01/25/18 Stage I -  Intact skin with non-blanchable redness of a localized area usually over a bony prominence. (Active)  01/25/18 2300  Location: Sacrum  Location Orientation:   Staging: Stage I -  Intact skin with non-blanchable redness of a localized area usually over a bony prominence.  Wound Description (Comments):   Present on Admission: Yes     DVT prophylaxis: lovexno Family Communication:none Disposition Plan/Barrier to D/C: unable to determine Code Status:     Code Status Orders  (From admission, onward)         Start     Ordered   01/25/18 2110  Do not attempt resuscitation (DNR)  Continuous    Question Answer Comment  In the event of cardiac or respiratory ARREST Do not call a "code blue"   In the event of cardiac or respiratory ARREST Do not perform Intubation, CPR, defibrillation or ACLS   In the event of cardiac or respiratory ARREST Use medication by any route, position, wound care, and other measures to relive pain and suffering. May use oxygen, suction and manual treatment of airway obstruction as needed for comfort.      01/25/18 2111        Code Status History    Date Active Date Inactive Code Status Order ID Comments User Context   03/31/2017 1748 04/04/2017 1508 Full Code [id number]  Erline Hau, MD Inpatient   07/28/2016 1538 08/03/2016 2104 Full Code [id number]  Leanor Kail, [place] Inpatient   11/04/2015 1459 11/05/2015 1629 Full  Code [id number]  [name], MD Inpatient   09/14/2015 1627 09/21/2015 1852 Full Code [id number]  Conrad Richardson, NP Inpatient   07/23/2015 2135 08/01/2015 1716 DNR [id number]  Phillips Grout, MD Inpatient   10/11/2014 0431 10/19/2014 1635 DNR [id number]   Phillips Grout, MD Inpatient   09/03/2014 2334 09/12/2014 1701 DNR [id number]  Etta Quill, DO Inpatient   09/03/2014 2334 09/03/2014 2334 Full Code [id number]  Etta Quill, DO Inpatient   10/07/2013 1349 10/10/2013 1323 Full Code [id number]  Ulyses Amor, PA-C Inpatient   10/06/2013 1252 10/07/2013 1349 Full Code [id number]  [name], MD Inpatient   09/08/2013 1133 09/08/2013 1943 Full Code [id number]  [name], MD Inpatient   03/27/2013 1558 03/29/2013 1449 Full Code [id number]  Burnell Blanks, MD Inpatient   03/18/2013 1626 03/27/2013 1558 Full Code [id number]  Rogelia Mire, NP Inpatient   11/24/2010 1354 11/29/2010 1732 Full Code [id number]  Leandra Kern, RN Inpatient        IV Access:    Peripheral IV   Procedures and diagnostic studies:   Ct Abdomen Pelvis Wo Contrast  Result Date: 01/25/2018 CLINICAL DATA:  Abdomen pain for 3 days. EXAM: CT ABDOMEN AND PELVIS WITHOUT CONTRAST TECHNIQUE: Multidetector CT imaging of the abdomen and pelvis was performed following the standard protocol without IV contrast. COMPARISON:  May 16, 2014 FINDINGS: Lower chest: Mild atelectasis of bilateral lung bases are noted. The heart size is enlarged. Hepatobiliary: No focal liver abnormality is seen. Status post cholecystectomy. No biliary dilatation. Pancreas: Unremarkable. No pancreatic ductal dilatation or surrounding inflammatory changes. Spleen: Normal in size without focal abnormality. Adrenals/Urinary Tract: Adrenal glands are unremarkable. Kidneys are normal, without renal calculi, focal lesion, or hydronephrosis. Bladder is unremarkable. Stomach/Bowel: Small hiatal hernia is identified. The stomach is otherwise normal. Prior appendectomy. No evidence of bowel wall thickening, distention, or inflammatory changes. Vascular/Lymphatic: Aortic atherosclerosis. No enlarged abdominal or pelvic lymph nodes. Reproductive: Status post hysterectomy. No adnexal masses.  Other: Left inguinal herniation of mesenteric fat is identified. Minimal umbilical hernia of mesenteric fat is noted. Musculoskeletal: Degenerative joint changes of the spine are identified. IMPRESSION: No acute abnormality identified in the abdomen and pelvis. Moderate bowel content is identified throughout colon. Electronically Signed   By: Abelardo Diesel M.D.   On: 01/25/2018 19:42   Dg Chest Port 1 View  Result Date: 01/25/2018 CLINICAL DATA:  73 year old female with weakness. Chest pain for 1 week. EXAM: PORTABLE CHEST 1 VIEW COMPARISON:  03/31/2017 and earlier. FINDINGS: Portable AP semi upright view at 1553 hours. Stable cardiomegaly and mediastinal contours. Prior CABG. Allowing for portable technique the lungs are clear. No pneumothorax. Visualized tracheal air column is within normal limits. IMPRESSION: No acute cardiopulmonary abnormality. Electronically Signed   By: Genevie Ann M.D.   On: 01/25/2018 16:11   Vas [place] Lower Extremity Arterial Duplex  Result Date: 01/25/2018 LOWER EXTREMITY ARTERIAL DUPLEX STUDY Indications: Ulceration of the left lower extremity calf., gangrene / discharge              of left toes.  Vascular Interventions: Left femoral to popliteal bypass graft 09/2013. Current ABI:            Not obtained Limitations: Patient unable to be properly positioned. Recent release from              nursing home. Comparison Study: Occluded since prior exam of 02/23/2016 Performing Technologist: Alvia Grove RVT  Examination Guidelines: A  complete evaluation includes B-mode imaging, spectral Doppler, color Doppler, and power Doppler as needed of all accessible portions of each vessel. Bilateral testing is considered an integral part of a complete examination. Limited examinations for reoccurring indications may be performed as noted.  Left Graft #1: +--------------------+-------------+--------+----------+----------------------+                     PSV cm/s     StenosisWaveform  Comments                +--------------------+-------------+--------+----------+----------------------+ Inflow              137                  biphasic                         +--------------------+-------------+--------+----------+----------------------+ Proximal Anastomosis106                  biphasic                         +--------------------+-------------+--------+----------+----------------------+ Proximal Graft      40 / occludedoccluded          Prestenotic / occluded +--------------------+-------------+--------+----------+----------------------+ Mid Graft                        occluded                                 +--------------------+-------------+--------+----------+----------------------+ Distal Graft                     occluded                                 +--------------------+-------------+--------+----------+----------------------+ Distal Anastamosis               occluded                                 +--------------------+-------------+--------+----------+----------------------+ Outflow             37                   monophasicdampened               +--------------------+-------------+--------+----------+----------------------+  Summary: Left: No color flow or Doppler signal in the femoral to popliteal graft consistent with occlusion.  See table(s) above for measurements and observations. Electronically signed by Servando Snare MD on 01/25/2018 at 3:14:46 PM.    Final      Medical Consultants:    None.  Anti-Infectives:   IV vancomycin and cefepime.  Subjective:    Verley Pariseau Steier relates her pain is not controlled, otherwise she has no new complaints..  Objective:    Vitals:   01/26/18 0436 01/26/18 1427 01/26/18 2020 01/27/18 0510  BP: (!) 152/57 (!) 112/38 114/60 (!) 169/53  Pulse: (!) 48 (!) 46 67 62  Resp:  16    Temp: 98.1 F (36.7 C) (!) 97.3 F (36.3 C) 97.9 F (36.6 C) 98.3 F (36.8 C)  TempSrc: Oral Oral Oral Axillary    SpO2: 94% 93% 95% 92%    Intake/Output Summary (Last 24 hours) at 01/27/2018 0959 Last data filed at 01/27/2018 0945  Gross per 24 hour  Intake 1344.24 ml  Output 1200 ml  Net 144.24 ml   There were no vitals filed for this visit.  Exam: General exam: Chronically ill-appearing Respiratory system: Good air movement and clear to auscultation. Cardiovascular system: S1 & S2 heard, RRR. No JVD. Gastrointestinal system: Abdomen is nondistended, soft and nontender.  Central nervous system: Alert and oriented. No focal neurological deficits. Extremities: No pedal edema. Skin: Atrophic and darted left great toe, with discoloration of her legs bilaterally with bleeding on the left more than on the right.,  Appears cyanotic cyanotic Psychiatry: Judgement and insight appear normal. Mood & affect appropriate.    Data Reviewed:    Labs: Basic Metabolic Panel: Recent Labs  Lab 01/25/18 1552 01/26/18 0418  NA 136 137  K 4.6 4.7  CL 99 106  CO2 25 21*  GLUCOSE 185* 144*  BUN 44* 42*  CREATININE 2.02* 1.59*  CALCIUM 8.8* 8.5*   GFR Estimated Creatinine Clearance: 36.9 mL/min (A) (by C-G formula based on SCr of 1.59 mg/dL (H)). Liver Function Tests: Recent Labs  Lab 01/25/18 1552  AST 27  ALT 19  ALKPHOS 83  BILITOT 0.5  PROT 8.0  ALBUMIN 2.9*   Recent Labs  Lab 01/25/18 1552  LIPASE 31   No results for input(s): AMMONIA in the last 168 hours. Coagulation profile No results for input(s): INR, PROTIME in the last 168 hours.  CBC: Recent Labs  Lab 01/25/18 1552 01/26/18 0418  WBC 10.6* 6.6  NEUTROABS 9.0* 5.3  HGB 10.6* 9.3*  HCT 36.3 33.0*  MCV 85.0 84.8  PLT 275 182   Cardiac Enzymes: Recent Labs  Lab 01/25/18 1552 01/25/18 1806  CKTOTAL 51  --   TROPONINI  --  <0.03   BNP (last 3 results) No results for input(s): PROBNP in the last 8760 hours. CBG: Recent Labs  Lab 01/26/18 1638 01/26/18 2023 01/27/18 0006 01/27/18 0431 01/27/18 0752  GLUCAP  187* 190* 170* 148* 146*   D-Dimer: No results for input(s): DDIMER in the last 72 hours. Hgb A1c: No results for input(s): HGBA1C in the last 72 hours. Lipid Profile: No results for input(s): CHOL, HDL, LDLCALC, TRIG, CHOLHDL, LDLDIRECT in the last 72 hours. Thyroid function studies: No results for input(s): TSH, T4TOTAL, T3FREE, THYROIDAB in the last 72 hours.  Invalid input(s): FREET3 Anemia work up: No results for input(s): VITAMINB12, FOLATE, FERRITIN, TIBC, IRON, RETICCTPCT in the last 72 hours. Sepsis Labs: Recent Labs  Lab 01/25/18 1552 01/25/18 1559 01/25/18 1816 01/26/18 0418  WBC 10.6*  --   --  6.6  LATICACIDVEN  --  1.67 <0.30*  --    Microbiology Recent Results (from the past 240 hour(s))  Blood Culture (routine x 2)     Status: None (Preliminary result)   Collection Time: 01/25/18  3:40 PM  Result Value Ref Range Status   Specimen Description BLOOD RIGHT WRIST  Final   Special Requests   Final    BOTTLES DRAWN AEROBIC AND ANAEROBIC Blood Culture adequate volume   Culture   Final    NO GROWTH < 24 HOURS Performed at [facility], [place] [address] [place], [place] [address]    Report Status PENDING  Incomplete  Blood Culture (routine x 2)     Status: None (Preliminary result)   Collection Time: 01/25/18  3:45 PM  Result Value Ref Range Status   Specimen Description BLOOD LEFT FOREARM  Final   Special Requests   Final  BOTTLES DRAWN AEROBIC AND ANAEROBIC Blood Culture adequate volume   Culture   Final    NO GROWTH < 24 HOURS Performed at [facility], [place] [address] [place], [place] [address]    Report Status PENDING  Incomplete  MRSA PCR Screening     Status: None   Collection Time: 01/26/18  2:50 AM  Result Value Ref Range Status   MRSA by PCR NEGATIVE NEGATIVE Final    Comment:        The GeneXpert MRSA Assay (FDA approved for NASAL specimens only), is one component of a comprehensive MRSA colonization surveillance program. It is  not intended to diagnose MRSA infection nor to guide or monitor treatment for MRSA infections. Performed at [facility], [place] [address], [place], [place] [address]      Medications:   . atorvastatin  40 mg Oral q1800  . citalopram  20 mg Oral Daily  . feeding supplement (PRO-STAT SUGAR FREE 64)  30 mL Oral BID  . gabapentin  200 mg Oral Q12H  . insulin aspart  0-9 Units Subcutaneous Q4H  . isosorbide mononitrate  30 mg Oral Daily  . levothyroxine  75 mcg Oral QAC breakfast  . multivitamin with minerals  1 tablet Oral Daily  . sodium chloride flush  3 mL Intravenous Q12H  . sodium chloride flush  3 mL Intravenous Q12H   Continuous Infusions: . sodium chloride 10 mL/hr at 01/27/18 0300  . ceFEPime (MAXIPIME) IV 1 g (01/26/18 1753)  . metronidazole 500 mg (01/27/18 0933)  . vancomycin       LOS: 2 days   Charlynne Cousins  [facility]   *Please refer to [email or web address], password [email or web address] to get updated schedule on who will round on this patient, as hospitalists switch teams weekly. If 7PM-7AM, please contact night-coverage at [email or web address], password [email or web address] for any overnight needs.  01/27/2018, 9:59 AM

## 2018-01-27 NOTE — Progress Notes (Signed)
Patient is resting comfortably today.  I did not wake her up.  We anticipate left above-knee amputation early next week  [name] [name]

## 2018-01-27 NOTE — Progress Notes (Addendum)
Pharmacy Antibiotic Note  MADELINE PHO is a 73 y.o. female admitted on 01/25/2018 with cellulitis. Pharmacy has been consulted for vancomycin and cefepime dosing. Pt is afebrile and WBC is minimally elevated.   SCr has improved to 1.59 with estimated CrCl ~ 37 ml/min.  Lactic acid has resolved.   We will adjust her cefepime dose today.   Plan: Adjust Vancomycin to 1000 mg IV to every 24 hours. AUC 451, VP 28, VT 12.5 Change cefepime to 2g IV q12 Follow-up amputation post-operative plans     Temp (24hrs), Avg:98.1 F (36.7 C), Min:97.9 F (36.6 C), Max:98.3 F (36.8 C)  Recent Labs  Lab 01/25/18 1552 01/25/18 1559 01/25/18 1816 01/26/18 0418  WBC 10.6*  --   --  6.6  CREATININE 2.02*  --   --  1.59*  LATICACIDVEN  --  1.67 <0.30*  --     Estimated Creatinine Clearance: 36.9 mL/min (A) (by C-G formula based on SCr of 1.59 mg/dL (H)).    Allergies  Allergen Reactions  . Other Shortness Of Breath, Rash and Other (See Comments)    CANNOT EAT ANY BERRIES!!  . Strawberry Extract Hives, Shortness Of Breath and Rash  . Sulfa Antibiotics Swelling and Other (See Comments)    Bodily Swelling  . Coffee Bean Extract [Coffea Arabica] Itching and Rash  . Tape Other (See Comments)    Tears skin.  Please use "paper" tape only.    Antimicrobials this admission: Vanc 1/17>> Cefepime 1/17>> Flagyl 1/17>>1/19  Dose adjustments this admission: 1/18 Adjust vanc from 1g q48 to 1250 q48 for improved renal function  Microbiology results: 1/17 blood>>ngtd 1/18 MRSA>>neg  Onnie Boer, PharmD, BCIDP, AAHIVP, CPP Infectious Disease Pharmacist 01/27/2018 3:11 PM

## 2018-01-28 LAB — BASIC METABOLIC PANEL
Anion gap: 9 (ref 5–15)
BUN: 36 mg/dL — ABNORMAL HIGH (ref 8–23)
CO2: 23 mmol/L (ref 22–32)
CREATININE: 1.14 mg/dL — AB (ref 0.44–1.00)
Calcium: 8.4 mg/dL — ABNORMAL LOW (ref 8.9–10.3)
Chloride: 105 mmol/L (ref 98–111)
GFR calc non Af Amer: 49 mL/min — ABNORMAL LOW (ref >60)
GFR, EST AFRICAN AMERICAN: 56 mL/min — AB (ref >60)
Glucose, Bld: 119 mg/dL — ABNORMAL HIGH (ref 70–99)
Potassium: 4.4 mmol/L (ref 3.5–5.1)
Sodium: 137 mmol/L (ref 135–145)

## 2018-01-28 LAB — CBC
HCT: 30.6 % — ABNORMAL LOW (ref 36.0–46.0)
Hemoglobin: 9 g/dL — ABNORMAL LOW (ref 12.0–15.0)
MCH: 25 pg — ABNORMAL LOW (ref 26.0–34.0)
MCHC: 29.4 g/dL — ABNORMAL LOW (ref 30.0–36.0)
MCV: 85 fL (ref 80.0–100.0)
Platelets: 190 10*3/uL (ref 150–400)
RBC: 3.6 MIL/uL — ABNORMAL LOW (ref 3.87–5.11)
RDW: 20 % — ABNORMAL HIGH (ref 11.5–15.5)
WBC: 6.7 10*3/uL (ref 4.0–10.5)
nRBC: 0 % (ref 0.0–0.2)

## 2018-01-28 LAB — GLUCOSE, CAPILLARY
Glucose-Capillary: 112 mg/dL — ABNORMAL HIGH (ref 70–99)
Glucose-Capillary: 135 mg/dL — ABNORMAL HIGH (ref 70–99)
Glucose-Capillary: 150 mg/dL — ABNORMAL HIGH (ref 70–99)
Glucose-Capillary: 172 mg/dL — ABNORMAL HIGH (ref 70–99)
Glucose-Capillary: 173 mg/dL — ABNORMAL HIGH (ref 70–99)

## 2018-01-28 LAB — TROPONIN I: Troponin I: 0.04 ng/mL (ref <0.03)

## 2018-01-28 MED ORDER — TORSEMIDE 20 MG PO TABS
80.0000 mg | ORAL_TABLET | Freq: Two times a day (BID) | ORAL | Status: DC
  Administered 2018-01-28 – 2018-01-30 (×4): 80 mg via ORAL
  Filled 2018-01-28 (×4): qty 4

## 2018-01-28 MED ORDER — POTASSIUM CHLORIDE CRYS ER 20 MEQ PO TBCR
20.0000 meq | EXTENDED_RELEASE_TABLET | Freq: Two times a day (BID) | ORAL | Status: DC
  Administered 2018-01-28 – 2018-01-30 (×4): 20 meq via ORAL
  Filled 2018-01-28 (×4): qty 1

## 2018-01-28 MED ORDER — INSULIN ASPART 100 UNIT/ML ~~LOC~~ SOLN
0.0000 [IU] | Freq: Every day | SUBCUTANEOUS | Status: DC
  Administered 2018-01-29: 4 [IU] via SUBCUTANEOUS

## 2018-01-28 MED ORDER — INSULIN ASPART 100 UNIT/ML ~~LOC~~ SOLN
0.0000 [IU] | Freq: Three times a day (TID) | SUBCUTANEOUS | Status: DC
  Administered 2018-01-28 (×2): 2 [IU] via SUBCUTANEOUS
  Administered 2018-01-29: 5 [IU] via SUBCUTANEOUS
  Administered 2018-01-30 (×3): 2 [IU] via SUBCUTANEOUS
  Administered 2018-01-31 – 2018-02-01 (×4): 1 [IU] via SUBCUTANEOUS
  Administered 2018-02-01: 2 [IU] via SUBCUTANEOUS

## 2018-01-28 MED ORDER — INSULIN ASPART 100 UNIT/ML ~~LOC~~ SOLN
3.0000 [IU] | Freq: Three times a day (TID) | SUBCUTANEOUS | Status: DC
  Administered 2018-01-28 – 2018-02-01 (×10): 3 [IU] via SUBCUTANEOUS

## 2018-01-28 NOTE — Progress Notes (Signed)
Patient's family called staff to room.  Patient is currently c/o chest pain.  Vitals are normal however BP is high 164/94.  Patient is alert and oriented x 2.  Will contact on call MD.

## 2018-01-28 NOTE — Progress Notes (Signed)
Lab called regarding Troponin 0.04.  On call MD notified via [place].

## 2018-01-28 NOTE — Care Management Important Message (Signed)
Important Message  Patient Details  Name: LIYA STROLLO MRN: [id number] Date of Birth: 1945/05/14   Medicare Important Message Given:     Due to illness patient was not able to sign.  Unsigned copy left   Jaelani Posa 01/28/2018, 4:08 PM

## 2018-01-28 NOTE — Progress Notes (Signed)
[facility] PROGRESS NOTE    Progress Note  [name] [name]  GYJ:[id number] DOB: Apr 11, 1945 DOA: 01/25/2018 PCP: Glenda Chroman, MD     Brief Narrative:   [name] [name] is an 73 y.o. female past medical history of peripheral vascular disease status post left femoral-popliteal bypass, Peripheral Vascular Atherectomy 11/04/15,CAD status post CABG, chronic diastolic heart failure, chronic kidney disease stage IV hypothyroidism insulin-dependent diabetes mellitus presents to the emergency room for evaluation of discoloration of her left foot and nonhealing left shin ulcer  Assessment/Plan:   Critical ischemia of extremity with history of revascularization of same extremity/left toe gangrene: With a history of PAD status post femoropopliteal on the left. Continue empiric antibiotics. Surgery was consulted they recommended below the knee amputation as leg is not salvageable scheduled for early next week. He has remained afebrile, with no leukocytosis.  Chronic diastolic heart failure: Appears to be compensated. KVO IV fluids patient on a diet resume, torsemide and potassium.  CAD: Denies any chest pain.  EKG was unrevealing. Cardiac biomarkers were undetectable. Continue statins, aspirin and Plavix have been held for anticipated amputation resume after surgical intervention.  Insulin-dependent diabetes mellitus type 2: With an A1c of 6.2 back in March 2019. He is currently n.p.o. continue sliding scale and CBGs every 4.  Hypothyroidism: Continue Synthroid.  Chronic kidney disease stage IV: Her baseline creatinine is around 1.5-2.0 Creatinine is down from her baseline we will resume her torsemide. Restrict her fluids recheck a basic metabolic panel in the morning.  Sacral decubitus ulcer, stage II ([place]) We will consult wound for sacral decubitus ulcer.  RN Pressure Injury Documentation: Pressure Injury 03/31/17 Stage I -  Intact skin with non-blanchable redness of a  localized area usually over a bony prominence. (Active)  03/31/17 1637  Location: Buttocks  Location Orientation: Right  Staging: Stage I -  Intact skin with non-blanchable redness of a localized area usually over a bony prominence.  Wound Description (Comments):   Present on Admission: Yes     Pressure Injury 01/25/18 Stage I -  Intact skin with non-blanchable redness of a localized area usually over a bony prominence. (Active)  01/25/18 2300  Location: Sacrum  Location Orientation:   Staging: Stage I -  Intact skin with non-blanchable redness of a localized area usually over a bony prominence.  Wound Description (Comments):   Present on Admission: Yes     DVT prophylaxis: lovexno Family Communication:none Disposition Plan/Barrier to D/C: unable to determine Code Status:     Code Status Orders  (From admission, onward)         Start     Ordered   01/25/18 2110  Do not attempt resuscitation (DNR)  Continuous    Question Answer Comment  In the event of cardiac or respiratory ARREST Do not call a "code blue"   In the event of cardiac or respiratory ARREST Do not perform Intubation, CPR, defibrillation or ACLS   In the event of cardiac or respiratory ARREST Use medication by any route, position, wound care, and other measures to relive pain and suffering. May use oxygen, suction and manual treatment of airway obstruction as needed for comfort.      01/25/18 2111        Code Status History    Date Active Date Inactive Code Status Order ID Comments User Context   03/31/2017 1748 04/04/2017 1508 Full Code [id number]  Erline Hau, MD Inpatient   07/28/2016 1538 08/03/2016 2104 Full Code [id number]  Leanor Kail, [place] Inpatient   11/04/2015 1459 11/05/2015 1629 Full Code [id number]  [name], MD Inpatient   09/14/2015 1627 09/21/2015 1852 Full Code [id number]  Conrad [place], NP Inpatient   07/23/2015 2135 08/01/2015 1716 DNR [id number]  Phillips Grout, MD  Inpatient   10/11/2014 0431 10/19/2014 1635 DNR [id number]  Phillips Grout, MD Inpatient   09/03/2014 2334 09/12/2014 1701 DNR [id number]  Etta Quill, DO Inpatient   09/03/2014 2334 09/03/2014 2334 Full Code [id number]  Etta Quill, DO Inpatient   10/07/2013 1349 10/10/2013 1323 Full Code [id number]  Ulyses Amor, PA-C Inpatient   10/06/2013 1252 10/07/2013 1349 Full Code [id number]  [name], MD Inpatient   09/08/2013 1133 09/08/2013 1943 Full Code [id number]  [name], MD Inpatient   03/27/2013 1558 03/29/2013 1449 Full Code [id number]  Burnell Blanks, MD Inpatient   03/18/2013 1626 03/27/2013 1558 Full Code [id number]  Rogelia Mire, NP Inpatient   11/24/2010 1354 11/29/2010 1732 Full Code [id number]  Leandra Kern, RN Inpatient        IV Access:    Peripheral IV   Procedures and diagnostic studies:   No results found.   Medical Consultants:    None.  Anti-Infectives:   IV vancomycin and cefepime.  Subjective:    [name] [name] relates her pain is not controlled, otherwise she has no new complaints.  She feels tired..  Objective:    Vitals:   01/27/18 0510 01/27/18 1316 01/27/18 2028 01/28/18 0422  BP: (!) 169/53 (!) 140/53 (!) 153/63 (!) 159/46  Pulse: 62 (!) 41 (!) 53 (!) 48  Resp:   16 14  Temp: 98.3 F (36.8 C) 98.2 F (36.8 C) 97.9 F (36.6 C) 97.7 F (36.5 C)  TempSrc: Axillary Oral Oral Oral  SpO2: 92% 94% 94% 100%    Intake/Output Summary (Last 24 hours) at 01/28/2018 7673 Last data filed at 01/28/2018 0700 Gross per 24 hour  Intake 320 ml  Output 1000 ml  Net -680 ml   There were no vitals filed for this visit.  Exam: General exam: Chronically ill-appearing Respiratory system: Good air movement and clear to auscultation. Cardiovascular system: S1 & S2 heard, RRR. No JVD. Gastrointestinal system: Abdomen is nondistended, soft and nontender.  Central nervous system: Alert and oriented. No focal  neurological deficits. Extremities: No pedal edema. Skin: Atrophic and darted left great toe, with discoloration of her legs bilaterally with bleeding on the left more than on the right.,  Appears cyanotic cyanotic Psychiatry: Judgement and insight appear normal. Mood & affect appropriate.    Data Reviewed:    Labs: Basic Metabolic Panel: Recent Labs  Lab 01/25/18 1552 01/26/18 0418 01/28/18 0333  NA 136 137 137  K 4.6 4.7 4.4  CL 99 106 105  CO2 25 21* 23  GLUCOSE 185* 144* 119*  BUN 44* 42* 36*  CREATININE 2.02* 1.59* 1.14*  CALCIUM 8.8* 8.5* 8.4*   GFR Estimated Creatinine Clearance: 51.5 mL/min (A) (by C-G formula based on SCr of 1.14 mg/dL (H)). Liver Function Tests: Recent Labs  Lab 01/25/18 1552  AST 27  ALT 19  ALKPHOS 83  BILITOT 0.5  PROT 8.0  ALBUMIN 2.9*   Recent Labs  Lab 01/25/18 1552  LIPASE 31   No results for input(s): AMMONIA in the last 168 hours. Coagulation profile No results for input(s): INR, PROTIME in the last 168 hours.  CBC: Recent Labs  Lab 01/25/18 1552 01/26/18 0418  01/28/18 0333  WBC 10.6* 6.6 6.7  NEUTROABS 9.0* 5.3  --   HGB 10.6* 9.3* 9.0*  HCT 36.3 33.0* 30.6*  MCV 85.0 84.8 85.0  PLT 275 182 190   Cardiac Enzymes: Recent Labs  Lab 01/25/18 1552 01/25/18 1806  CKTOTAL 51  --   TROPONINI  --  <0.03   BNP (last 3 results) No results for input(s): PROBNP in the last 8760 hours. CBG: Recent Labs  Lab 01/27/18 1158 01/27/18 1652 01/27/18 2025 01/28/18 0025 01/28/18 0417  GLUCAP 186* 135* 174* 135* 112*   D-Dimer: No results for input(s): DDIMER in the last 72 hours. Hgb A1c: No results for input(s): HGBA1C in the last 72 hours. Lipid Profile: No results for input(s): CHOL, HDL, LDLCALC, TRIG, CHOLHDL, LDLDIRECT in the last 72 hours. Thyroid function studies: No results for input(s): TSH, T4TOTAL, T3FREE, THYROIDAB in the last 72 hours.  Invalid input(s): FREET3 Anemia work up: No results for  input(s): VITAMINB12, FOLATE, FERRITIN, TIBC, IRON, RETICCTPCT in the last 72 hours. Sepsis Labs: Recent Labs  Lab 01/25/18 1552 01/25/18 1559 01/25/18 1816 01/26/18 0418 01/28/18 0333  WBC 10.6*  --   --  6.6 6.7  LATICACIDVEN  --  1.67 <0.30*  --   --    Microbiology Recent Results (from the past 240 hour(s))  Blood Culture (routine x 2)     Status: None (Preliminary result)   Collection Time: 01/25/18  3:40 PM  Result Value Ref Range Status   Specimen Description BLOOD RIGHT WRIST  Final   Special Requests   Final    BOTTLES DRAWN AEROBIC AND ANAEROBIC Blood Culture adequate volume   Culture   Final    NO GROWTH 3 DAYS Performed at [facility], [address], [place], [place] [address]    Report Status PENDING  Incomplete  Blood Culture (routine x 2)     Status: None (Preliminary result)   Collection Time: 01/25/18  3:45 PM  Result Value Ref Range Status   Specimen Description BLOOD LEFT FOREARM  Final   Special Requests   Final    BOTTLES DRAWN AEROBIC AND ANAEROBIC Blood Culture adequate volume   Culture   Final    NO GROWTH 3 DAYS Performed at [facility], [place] [address], [place], [place] [address]    Report Status PENDING  Incomplete  MRSA PCR Screening     Status: None   Collection Time: 01/26/18  2:50 AM  Result Value Ref Range Status   MRSA by PCR NEGATIVE NEGATIVE Final    Comment:        The GeneXpert MRSA Assay (FDA approved for NASAL specimens only), is one component of a comprehensive MRSA colonization surveillance program. It is not intended to diagnose MRSA infection nor to guide or monitor treatment for MRSA infections. Performed at [facility], [place] [address], [place], [place] [address]      Medications:   . atorvastatin  40 mg Oral q1800  . citalopram  20 mg Oral Daily  . feeding supplement (PRO-STAT SUGAR FREE 64)  30 mL Oral BID  . gabapentin  200 mg Oral Q12H  . insulin aspart  0-9 Units Subcutaneous Q4H  .  isosorbide mononitrate  30 mg Oral Daily  . levothyroxine  75 mcg Oral QAC breakfast  . multivitamin with minerals  1 tablet Oral Daily  . sodium chloride flush  3 mL Intravenous Q12H  . sodium chloride flush  3 mL  Intravenous Q12H   Continuous Infusions: . sodium chloride 250 mL (01/28/18 0251)  . ceFEPime (MAXIPIME) IV 2 g (01/28/18 0307)  . vancomycin 1,000 mg (01/27/18 1749)     LOS: 3 days   Charlynne Cousins  [facility]   *Please refer to [email or web address], password [email or web address] to get updated schedule on who will round on this patient, as hospitalists switch teams weekly. If 7PM-7AM, please contact night-coverage at [email or web address], password [email or web address] for any overnight needs.  01/28/2018, 9:25 AM

## 2018-01-28 NOTE — Progress Notes (Signed)
   VASCULAR SURGERY ASSESSMENT & PLAN:   NONHEALING WOUNDS LEFT LEG: I would agree with Dr. Stephens Shire assessment that the left lower extremity is not salvageable.  I have scheduled her for a left above-the-knee amputation tomorrow.  I have discussed the indications for the procedure and the potential complications and she is agreeable to proceed.  I have written preoperative orders.  SUBJECTIVE:   Comfortable.  PHYSICAL EXAM:   Vitals:   01/27/18 0510 01/27/18 1316 01/27/18 2028 01/28/18 0422  BP: (!) 169/53 (!) 140/53 (!) 153/63 (!) 159/46  Pulse: 62 (!) 41 (!) 53 (!) 48  Resp:   16 14  Temp: 98.3 F (36.8 C) 98.2 F (36.8 C) 97.9 F (36.6 C) 97.7 F (36.5 C)  TempSrc: Axillary Oral Oral Oral  SpO2: 92% 94% 94% 100%   Gangrenous wounds on left foot and chronic venous ulcers left leg  LABS:   Lab Results  Component Value Date   WBC 6.7 01/28/2018   HGB 9.0 (L) 01/28/2018   HCT 30.6 (L) 01/28/2018   MCV 85.0 01/28/2018   PLT 190 01/28/2018   Lab Results  Component Value Date   CREATININE 1.14 (H) 01/28/2018   CBG (last 3)  Recent Labs    01/27/18 2025 01/28/18 0025 01/28/18 0417  GLUCAP 174* 135* 112*    PROBLEM LIST:    Principal Problem:   Critical ischemia of extremity with history of revascularization of same extremity Active Problems:   Carotid artery disease (HCC)   CKD (chronic kidney disease), stage IV (HCC)   Acquired hypothyroidism   Chronic diastolic CHF (congestive heart failure) (HCC)   Uncontrolled type 2 diabetes mellitus with hyperglycemia (HCC)   Sacral decubitus ulcer, stage II (HCC)   CURRENT MEDS:   . atorvastatin  40 mg Oral q1800  . citalopram  20 mg Oral Daily  . feeding supplement (PRO-STAT SUGAR FREE 64)  30 mL Oral BID  . gabapentin  200 mg Oral Q12H  . insulin aspart  0-5 Units Subcutaneous QHS  . insulin aspart  0-9 Units Subcutaneous TID WC  . insulin aspart  3 Units Subcutaneous TID WC  . isosorbide mononitrate  30  mg Oral Daily  . levothyroxine  75 mcg Oral QAC breakfast  . multivitamin with minerals  1 tablet Oral Daily  . potassium chloride SA  20 mEq Oral BID  . sodium chloride flush  3 mL Intravenous Q12H  . sodium chloride flush  3 mL Intravenous Q12H  . torsemide  80 mg Oral BID    [name] [name] Beeper: [phone] Office: [phone] 01/28/2018

## 2018-01-28 NOTE — H&P (View-Only) (Signed)
   VASCULAR SURGERY ASSESSMENT & PLAN:   NONHEALING WOUNDS LEFT LEG: I would agree with Dr. Stephens Shire assessment that the left lower extremity is not salvageable.  I have scheduled her for a left above-the-knee amputation tomorrow.  I have discussed the indications for the procedure and the potential complications and she is agreeable to proceed.  I have written preoperative orders.  SUBJECTIVE:   Comfortable.  PHYSICAL EXAM:   Vitals:   01/27/18 0510 01/27/18 1316 01/27/18 2028 01/28/18 0422  BP: (!) 169/53 (!) 140/53 (!) 153/63 (!) 159/46  Pulse: 62 (!) 41 (!) 53 (!) 48  Resp:   16 14  Temp: 98.3 F (36.8 C) 98.2 F (36.8 C) 97.9 F (36.6 C) 97.7 F (36.5 C)  TempSrc: Axillary Oral Oral Oral  SpO2: 92% 94% 94% 100%   Gangrenous wounds on left foot and chronic venous ulcers left leg  LABS:   Lab Results  Component Value Date   WBC 6.7 01/28/2018   HGB 9.0 (L) 01/28/2018   HCT 30.6 (L) 01/28/2018   MCV 85.0 01/28/2018   PLT 190 01/28/2018   Lab Results  Component Value Date   CREATININE 1.14 (H) 01/28/2018   CBG (last 3)  Recent Labs    01/27/18 2025 01/28/18 0025 01/28/18 0417  GLUCAP 174* 135* 112*    PROBLEM LIST:    Principal Problem:   Critical ischemia of extremity with history of revascularization of same extremity Active Problems:   Carotid artery disease (HCC)   CKD (chronic kidney disease), stage IV (HCC)   Acquired hypothyroidism   Chronic diastolic CHF (congestive heart failure) (HCC)   Uncontrolled type 2 diabetes mellitus with hyperglycemia (HCC)   Sacral decubitus ulcer, stage II (HCC)   CURRENT MEDS:   . atorvastatin  40 mg Oral q1800  . citalopram  20 mg Oral Daily  . feeding supplement (PRO-STAT SUGAR FREE 64)  30 mL Oral BID  . gabapentin  200 mg Oral Q12H  . insulin aspart  0-5 Units Subcutaneous QHS  . insulin aspart  0-9 Units Subcutaneous TID WC  . insulin aspart  3 Units Subcutaneous TID WC  . isosorbide mononitrate  30  mg Oral Daily  . levothyroxine  75 mcg Oral QAC breakfast  . multivitamin with minerals  1 tablet Oral Daily  . potassium chloride SA  20 mEq Oral BID  . sodium chloride flush  3 mL Intravenous Q12H  . sodium chloride flush  3 mL Intravenous Q12H  . torsemide  80 mg Oral BID    Deitra Mayo Beeper: [phone] Office: [phone] 01/28/2018

## 2018-01-28 NOTE — Plan of Care (Signed)
  Problem: Pain Managment: Goal: General experience of comfort will improve Outcome: Progressing   Problem: Safety: Goal: Ability to remain free from injury will improve Outcome: Progressing   Problem: Skin Integrity: Goal: Risk for impaired skin integrity will decrease Outcome: Progressing

## 2018-01-28 NOTE — Progress Notes (Signed)
PT Cancellation Note  Patient Details Name: [name] [name] MRN: [id number] DOB: 03-01-1945   Cancelled Treatment:     Order received. Pt scheduled for AKA tomorrow morning. Please re-order PT as appropriate post-op.    Shary Decamp [facility] 01/28/2018, 3:57 PM  [place] Pager [phone] Office [phone]

## 2018-01-28 NOTE — Progress Notes (Signed)
STAT EKG done.  Patient received Nitro 0.4 mg PO at 2032.  Patient stated chest pain is better.

## 2018-01-29 ENCOUNTER — Inpatient Hospital Stay (HOSPITAL_COMMUNITY): Payer: Medicare Other | Admitting: Anesthesiology

## 2018-01-29 ENCOUNTER — Encounter (HOSPITAL_COMMUNITY)
Admission: EM | Disposition: A | Discharge status: Skilled Nursing Facility | Payer: Self-pay | Source: Home / Self Care | Attending: Internal Medicine

## 2018-01-29 HISTORY — PX: AMPUTATION: SHX166

## 2018-01-29 LAB — SURGICAL PCR SCREEN
MRSA, PCR: NEGATIVE
Staphylococcus aureus: POSITIVE — AB

## 2018-01-29 LAB — BASIC METABOLIC PANEL
Anion gap: 12 (ref 5–15)
BUN: 39 mg/dL — AB (ref 8–23)
CO2: 26 mmol/L (ref 22–32)
CREATININE: 1.12 mg/dL — AB (ref 0.44–1.00)
Calcium: 8.8 mg/dL — ABNORMAL LOW (ref 8.9–10.3)
Chloride: 97 mmol/L — ABNORMAL LOW (ref 98–111)
GFR calc Af Amer: 58 mL/min — ABNORMAL LOW (ref >60)
GFR calc non Af Amer: 50 mL/min — ABNORMAL LOW (ref >60)
Glucose, Bld: 146 mg/dL — ABNORMAL HIGH (ref 70–99)
Potassium: 3.7 mmol/L (ref 3.5–5.1)
SODIUM: 135 mmol/L (ref 135–145)

## 2018-01-29 LAB — CBC
HCT: 32.7 % — ABNORMAL LOW (ref 36.0–46.0)
Hemoglobin: 10 g/dL — ABNORMAL LOW (ref 12.0–15.0)
MCH: 24.9 pg — ABNORMAL LOW (ref 26.0–34.0)
MCHC: 30.6 g/dL (ref 30.0–36.0)
MCV: 81.3 fL (ref 80.0–100.0)
PLATELETS: 197 10*3/uL (ref 150–400)
RBC: 4.02 MIL/uL (ref 3.87–5.11)
RDW: 19.7 % — AB (ref 11.5–15.5)
WBC: 7.8 10*3/uL (ref 4.0–10.5)
nRBC: 0 % (ref 0.0–0.2)

## 2018-01-29 LAB — GLUCOSE, CAPILLARY
Glucose-Capillary: 131 mg/dL — ABNORMAL HIGH (ref 70–99)
Glucose-Capillary: 145 mg/dL — ABNORMAL HIGH (ref 70–99)
Glucose-Capillary: 184 mg/dL — ABNORMAL HIGH (ref 70–99)
Glucose-Capillary: 260 mg/dL — ABNORMAL HIGH (ref 70–99)
Glucose-Capillary: 321 mg/dL — ABNORMAL HIGH (ref 70–99)

## 2018-01-29 LAB — TROPONIN I: TROPONIN I: 0.04 ng/mL — AB (ref <0.03)

## 2018-01-29 SURGERY — AMPUTATION, ABOVE KNEE
Anesthesia: General | Laterality: Left

## 2018-01-29 MED ORDER — LABETALOL HCL 5 MG/ML IV SOLN
10.0000 mg | INTRAVENOUS | Status: DC | PRN
  Filled 2018-01-29: qty 4

## 2018-01-29 MED ORDER — ACETAMINOPHEN 650 MG RE SUPP: 325.0000 mg | RECTAL | Status: DC | PRN

## 2018-01-29 MED ORDER — FENTANYL CITRATE (PF) 100 MCG/2ML IJ SOLN
25.0000 ug | INTRAMUSCULAR | Status: DC | PRN
  Administered 2018-01-29: 50 ug via INTRAVENOUS
  Administered 2018-01-29: 25 ug via INTRAVENOUS

## 2018-01-29 MED ORDER — HYDROMORPHONE HCL 1 MG/ML IJ SOLN
0.5000 mg | INTRAMUSCULAR | Status: DC | PRN
  Administered 2018-01-29 – 2018-01-31 (×4): 1 mg via INTRAVENOUS
  Filled 2018-01-29 (×4): qty 1

## 2018-01-29 MED ORDER — EPHEDRINE SULFATE-NACL 50-0.9 MG/10ML-% IV SOSY
PREFILLED_SYRINGE | INTRAVENOUS | Status: DC | PRN
  Administered 2018-01-29: 5 mg via INTRAVENOUS
  Administered 2018-01-29: 10 mg via INTRAVENOUS

## 2018-01-29 MED ORDER — CEFAZOLIN SODIUM-DEXTROSE 2-4 GM/100ML-% IV SOLN: 2.0000 g | Freq: Three times a day (TID) | INTRAVENOUS | Status: DC

## 2018-01-29 MED ORDER — PANTOPRAZOLE SODIUM 40 MG PO TBEC
40.0000 mg | DELAYED_RELEASE_TABLET | Freq: Every day | ORAL | Status: DC
  Administered 2018-01-29 – 2018-02-01 (×4): 40 mg via ORAL
  Filled 2018-01-29 (×3): qty 1

## 2018-01-29 MED ORDER — BACITRACIN ZINC 500 UNIT/GM EX OINT
TOPICAL_OINTMENT | CUTANEOUS | Status: AC
  Filled 2018-01-29: qty 28.35

## 2018-01-29 MED ORDER — OXYCODONE-ACETAMINOPHEN 5-325 MG PO TABS
1.0000 | ORAL_TABLET | ORAL | Status: DC | PRN
  Administered 2018-01-29 – 2018-02-01 (×10): 2 via ORAL
  Filled 2018-01-29 (×9): qty 2

## 2018-01-29 MED ORDER — EPHEDRINE 5 MG/ML INJ
INTRAVENOUS | Status: AC
  Filled 2018-01-29: qty 10

## 2018-01-29 MED ORDER — BACITRACIN ZINC 500 UNIT/GM EX OINT
TOPICAL_OINTMENT | CUTANEOUS | Status: DC | PRN
  Administered 2018-01-29: 1 via TOPICAL

## 2018-01-29 MED ORDER — LACTATED RINGERS IV SOLN
INTRAVENOUS | Status: DC
  Administered 2018-01-29: 10:00:00 via INTRAVENOUS

## 2018-01-29 MED ORDER — POTASSIUM CHLORIDE CRYS ER 20 MEQ PO TBCR: 20.0000 meq | EXTENDED_RELEASE_TABLET | Freq: Once | ORAL | Status: DC | PRN

## 2018-01-29 MED ORDER — FENTANYL CITRATE (PF) 100 MCG/2ML IJ SOLN
INTRAMUSCULAR | Status: AC
  Filled 2018-01-29: qty 2

## 2018-01-29 MED ORDER — LIDOCAINE 2% (20 MG/ML) 5 ML SYRINGE
INTRAMUSCULAR | Status: AC
  Filled 2018-01-29: qty 5

## 2018-01-29 MED ORDER — HYDRALAZINE HCL 20 MG/ML IJ SOLN: 5.0000 mg | INTRAMUSCULAR | Status: DC | PRN

## 2018-01-29 MED ORDER — FENTANYL CITRATE (PF) 250 MCG/5ML IJ SOLN
INTRAMUSCULAR | Status: AC
  Filled 2018-01-29: qty 5

## 2018-01-29 MED ORDER — ONDANSETRON HCL 4 MG/2ML IJ SOLN
INTRAMUSCULAR | Status: DC | PRN
  Administered 2018-01-29: 4 mg via INTRAVENOUS

## 2018-01-29 MED ORDER — FENTANYL CITRATE (PF) 100 MCG/2ML IJ SOLN
INTRAMUSCULAR | Status: AC
  Administered 2018-01-29: 25 ug via INTRAVENOUS
  Filled 2018-01-29: qty 2

## 2018-01-29 MED ORDER — SODIUM CHLORIDE 0.9 % IV SOLN
INTRAVENOUS | Status: DC
  Administered 2018-01-29: 17:00:00 via INTRAVENOUS

## 2018-01-29 MED ORDER — ONDANSETRON HCL 4 MG/2ML IJ SOLN
INTRAMUSCULAR | Status: AC
  Filled 2018-01-29: qty 2

## 2018-01-29 MED ORDER — DEXAMETHASONE SODIUM PHOSPHATE 10 MG/ML IJ SOLN
INTRAMUSCULAR | Status: AC
  Filled 2018-01-29: qty 1

## 2018-01-29 MED ORDER — OXYCODONE HCL 5 MG PO TABS
ORAL_TABLET | ORAL | Status: AC
  Filled 2018-01-29: qty 1

## 2018-01-29 MED ORDER — ROPIVACAINE HCL 5 MG/ML IJ SOLN
INTRAMUSCULAR | Status: DC | PRN
  Administered 2018-01-29: 10 mL via PERINEURAL
  Administered 2018-01-29: 20 mL via PERINEURAL

## 2018-01-29 MED ORDER — DEXAMETHASONE SODIUM PHOSPHATE 10 MG/ML IJ SOLN
INTRAMUSCULAR | Status: DC | PRN
  Administered 2018-01-29: 10 mg via INTRAVENOUS

## 2018-01-29 MED ORDER — ACETAMINOPHEN 325 MG PO TABS: 325.0000 mg | ORAL_TABLET | ORAL | Status: DC | PRN

## 2018-01-29 MED ORDER — PROPOFOL 10 MG/ML IV BOLUS
INTRAVENOUS | Status: AC
  Filled 2018-01-29: qty 20

## 2018-01-29 MED ORDER — GUAIFENESIN-DM 100-10 MG/5ML PO SYRP
15.0000 mL | ORAL_SOLUTION | ORAL | Status: DC | PRN
  Filled 2018-01-29: qty 15

## 2018-01-29 MED ORDER — ALUM & MAG HYDROXIDE-SIMETH 200-200-20 MG/5ML PO SUSP: 15.0000 mL | ORAL | Status: DC | PRN

## 2018-01-29 MED ORDER — ONDANSETRON HCL 4 MG/2ML IJ SOLN: 4.0000 mg | Freq: Once | INTRAMUSCULAR | Status: DC | PRN

## 2018-01-29 MED ORDER — METOPROLOL TARTRATE 5 MG/5ML IV SOLN: 2.0000 mg | INTRAVENOUS | Status: DC | PRN

## 2018-01-29 MED ORDER — PROPOFOL 10 MG/ML IV BOLUS
INTRAVENOUS | Status: DC | PRN
  Administered 2018-01-29: 100 mg via INTRAVENOUS
  Administered 2018-01-29: 20 mg via INTRAVENOUS

## 2018-01-29 MED ORDER — PHENYLEPHRINE 40 MCG/ML (10ML) SYRINGE FOR IV PUSH (FOR BLOOD PRESSURE SUPPORT)
PREFILLED_SYRINGE | INTRAVENOUS | Status: AC
  Filled 2018-01-29: qty 10

## 2018-01-29 MED ORDER — OXYCODONE HCL 5 MG/5ML PO SOLN: 5.0000 mg | Freq: Once | ORAL | Status: AC | PRN

## 2018-01-29 MED ORDER — ENOXAPARIN SODIUM 40 MG/0.4ML ~~LOC~~ SOLN
40.0000 mg | SUBCUTANEOUS | Status: DC
  Administered 2018-01-30 – 2018-02-01 (×3): 40 mg via SUBCUTANEOUS
  Filled 2018-01-29 (×3): qty 0.4

## 2018-01-29 MED ORDER — FENTANYL CITRATE (PF) 100 MCG/2ML IJ SOLN
25.0000 ug | Freq: Once | INTRAMUSCULAR | Status: AC
  Administered 2018-01-29: 25 ug via INTRAVENOUS

## 2018-01-29 MED ORDER — GLYCOPYRROLATE PF 0.2 MG/ML IJ SOSY
PREFILLED_SYRINGE | INTRAMUSCULAR | Status: DC | PRN
  Administered 2018-01-29: 0.4 mg via INTRAVENOUS

## 2018-01-29 MED ORDER — ONDANSETRON HCL 4 MG/2ML IJ SOLN: 4.0000 mg | Freq: Four times a day (QID) | INTRAMUSCULAR | Status: DC | PRN

## 2018-01-29 MED ORDER — SODIUM CHLORIDE 0.9 % IR SOLN
Status: DC | PRN
  Administered 2018-01-29: 1

## 2018-01-29 MED ORDER — PHENOL 1.4 % MT LIQD: 1.0000 | OROMUCOSAL | Status: DC | PRN

## 2018-01-29 MED ORDER — PHENYLEPHRINE 40 MCG/ML (10ML) SYRINGE FOR IV PUSH (FOR BLOOD PRESSURE SUPPORT)
PREFILLED_SYRINGE | INTRAVENOUS | Status: DC | PRN
  Administered 2018-01-29: 80 ug via INTRAVENOUS

## 2018-01-29 MED ORDER — LACTATED RINGERS IV SOLN
INTRAVENOUS | Status: DC | PRN
  Administered 2018-01-29 (×2): via INTRAVENOUS

## 2018-01-29 MED ORDER — ATROPINE SULFATE 0.4 MG/ML IJ SOLN
INTRAMUSCULAR | Status: DC | PRN
  Administered 2018-01-29 (×3): 0.4 mg via INTRAVENOUS

## 2018-01-29 MED ORDER — FENTANYL CITRATE (PF) 250 MCG/5ML IJ SOLN
INTRAMUSCULAR | Status: DC | PRN
  Administered 2018-01-29: 50 ug via INTRAVENOUS

## 2018-01-29 MED ORDER — MAGNESIUM SULFATE 2 GM/50ML IV SOLN
2.0000 g | Freq: Once | INTRAVENOUS | Status: DC | PRN
  Filled 2018-01-29: qty 50

## 2018-01-29 MED ORDER — LIDOCAINE HCL (CARDIAC) PF 100 MG/5ML IV SOSY
PREFILLED_SYRINGE | INTRAVENOUS | Status: DC | PRN
  Administered 2018-01-29: 60 mg via INTRAVENOUS

## 2018-01-29 MED ORDER — OXYCODONE HCL 5 MG PO TABS
5.0000 mg | ORAL_TABLET | Freq: Once | ORAL | Status: AC | PRN
  Administered 2018-01-29: 5 mg via ORAL

## 2018-01-29 MED ORDER — DOCUSATE SODIUM 100 MG PO CAPS
100.0000 mg | ORAL_CAPSULE | Freq: Every day | ORAL | Status: DC
  Administered 2018-01-30 – 2018-02-01 (×3): 100 mg via ORAL
  Filled 2018-01-29 (×3): qty 1

## 2018-01-29 SURGICAL SUPPLY — 59 items
BANDAGE ELASTIC 4 VELCRO ST LF (GAUZE/BANDAGES/DRESSINGS) ×2 IMPLANT
BANDAGE ESMARK 6X9 LF (GAUZE/BANDAGES/DRESSINGS) ×1 IMPLANT
BLADE SAW RECIP 87.9 MT (BLADE) ×3 IMPLANT
BNDG CMPR 9X6 STRL LF SNTH (GAUZE/BANDAGES/DRESSINGS) ×1
BNDG COHESIVE 6X5 TAN STRL LF (GAUZE/BANDAGES/DRESSINGS) ×3 IMPLANT
BNDG ESMARK 6X9 LF (GAUZE/BANDAGES/DRESSINGS) ×3
BNDG GAUZE ELAST 4 BULKY (GAUZE/BANDAGES/DRESSINGS) ×3 IMPLANT
CANISTER SUCT 3000ML PPV (MISCELLANEOUS) ×3 IMPLANT
CLIP VESOCCLUDE MED 6/CT (CLIP) ×4 IMPLANT
COVER SURGICAL LIGHT HANDLE (MISCELLANEOUS) ×3 IMPLANT
COVER WAND RF STERILE (DRAPES) ×1 IMPLANT
CUFF TOURNIQUET SINGLE 18IN (TOURNIQUET CUFF) IMPLANT
CUFF TOURNIQUET SINGLE 24IN (TOURNIQUET CUFF) IMPLANT
CUFF TOURNIQUET SINGLE 34IN LL (TOURNIQUET CUFF) ×2 IMPLANT
CUFF TOURNIQUET SINGLE 44IN (TOURNIQUET CUFF) IMPLANT
DRAIN CHANNEL 19F RND (DRAIN) IMPLANT
DRAPE HALF SHEET 40X57 (DRAPES) ×3 IMPLANT
DRAPE ORTHO SPLIT 77X108 STRL (DRAPES) ×6
DRAPE SURG ORHT 6 SPLT 77X108 (DRAPES) ×2 IMPLANT
DRAPE U-SHAPE 47X51 STRL (DRAPES) ×3 IMPLANT
DRSG ADAPTIC 3X8 NADH LF (GAUZE/BANDAGES/DRESSINGS) ×3 IMPLANT
ELECT CAUTERY BLADE 6.4 (BLADE) ×3 IMPLANT
ELECT REM PT RETURN 9FT ADLT (ELECTROSURGICAL) ×3
ELECTRODE REM PT RTRN 9FT ADLT (ELECTROSURGICAL) ×1 IMPLANT
EVACUATOR SILICONE 100CC (DRAIN) IMPLANT
GAUZE SPONGE 4X4 12PLY STRL (GAUZE/BANDAGES/DRESSINGS) ×3 IMPLANT
GLOVE BIO SURGEON STRL SZ 6.5 (GLOVE) ×3 IMPLANT
GLOVE BIO SURGEON STRL SZ7.5 (GLOVE) ×3 IMPLANT
GLOVE BIO SURGEONS STRL SZ 6.5 (GLOVE) ×3
GLOVE BIOGEL PI IND STRL 6.5 (GLOVE) IMPLANT
GLOVE BIOGEL PI IND STRL 7.0 (GLOVE) IMPLANT
GLOVE BIOGEL PI IND STRL 8 (GLOVE) ×1 IMPLANT
GLOVE BIOGEL PI INDICATOR 6.5 (GLOVE) ×8
GLOVE BIOGEL PI INDICATOR 7.0 (GLOVE) ×2
GLOVE BIOGEL PI INDICATOR 8 (GLOVE) ×2
GOWN STRL REUS W/ TWL LRG LVL3 (GOWN DISPOSABLE) ×3 IMPLANT
GOWN STRL REUS W/TWL LRG LVL3 (GOWN DISPOSABLE) ×9
HEMOSTASIS OSTENE BONE 2.5 (HEMOSTASIS) ×3
HEMOSTAT HEMOSTS OSTN BONE 2.5 (HEMOSTASIS) IMPLANT
KIT BASIN OR (CUSTOM PROCEDURE TRAY) ×3 IMPLANT
KIT TURNOVER KIT B (KITS) ×3 IMPLANT
NS IRRIG 1000ML POUR BTL (IV SOLUTION) ×3 IMPLANT
PACK GENERAL/GYN (CUSTOM PROCEDURE TRAY) ×3 IMPLANT
PAD ARMBOARD 7.5X6 YLW CONV (MISCELLANEOUS) ×6 IMPLANT
SPONGE LAP 18X18 X RAY DECT (DISPOSABLE) ×2 IMPLANT
STAPLER VISISTAT (STAPLE) ×3 IMPLANT
STOCKINETTE IMPERVIOUS LG (DRAPES) ×3 IMPLANT
SUT ETHILON 3 0 PS 1 (SUTURE) IMPLANT
SUT SILK 0 TIES 10X30 (SUTURE) ×1 IMPLANT
SUT SILK 2 0 (SUTURE) ×3
SUT SILK 2 0 SH CR/8 (SUTURE) ×5 IMPLANT
SUT SILK 2-0 18XBRD TIE 12 (SUTURE) ×1 IMPLANT
SUT SILK 3 0 (SUTURE)
SUT SILK 3-0 18XBRD TIE 12 (SUTURE) ×1 IMPLANT
SUT VIC AB 2-0 CT1 18 (SUTURE) ×3 IMPLANT
TOWEL GREEN STERILE (TOWEL DISPOSABLE) ×6 IMPLANT
TOWEL GREEN STERILE FF (TOWEL DISPOSABLE) ×3 IMPLANT
UNDERPAD 30X30 (UNDERPADS AND DIAPERS) ×3 IMPLANT
WATER STERILE IRR 1000ML POUR (IV SOLUTION) ×3 IMPLANT

## 2018-01-29 NOTE — Plan of Care (Signed)
  Problem: Education: Goal: Knowledge of General Education information will improve Description Including pain rating scale, medication(s)/side effects and non-pharmacologic comfort measures Outcome: Progressing   Problem: Clinical Measurements: Goal: Ability to maintain clinical measurements within normal limits will improve Outcome: Progressing   Problem: Clinical Measurements: Goal: Will remain free from infection Outcome: Progressing

## 2018-01-29 NOTE — Anesthesia Postprocedure Evaluation (Signed)
Anesthesia Post Note  Patient: [name] [name]  Procedure(s) Performed: AMPUTATION ABOVE KNEE (Left )     Patient location during evaluation: PACU Anesthesia Type: General Level of consciousness: awake and alert Pain management: pain level controlled Vital Signs Assessment: post-procedure vital signs reviewed and stable Respiratory status: spontaneous breathing, nonlabored ventilation, respiratory function stable and patient connected to nasal cannula oxygen Cardiovascular status: blood pressure returned to baseline and stable Postop Assessment: no apparent nausea or vomiting Anesthetic complications: no    Last Vitals:  Vitals:   01/29/18 1330 01/29/18 1356  BP:  (!) 165/55  Pulse: 65 67  Resp: 20 18  Temp:  36.7 C  SpO2: 94% 94%    Last Pain:  Vitals:   01/29/18 1356  TempSrc: Oral  PainSc:                  Audry Pili

## 2018-01-29 NOTE — Progress Notes (Signed)
RN verified the presence of a signed informed consent that matches stated procedure by patient. Verified armband matches patient's stated name and birth date. Verified NPO status and that all jewelry, contact, glasses, dentures, and partials had been removed (if applicable).   Sinus brady with frequent PAC's on monitor. 94% on 2 lpm O2. Denies having CP, back, shoulder, or other pain indicative of referred CP since this am

## 2018-01-29 NOTE — Anesthesia Procedure Notes (Signed)
Anesthesia Regional Block: Popliteal block   Pre-Anesthetic Checklist: ,, timeout performed, Correct Patient, Correct Site, Correct Laterality, Correct Procedure, Correct Position, site marked, Risks and benefits discussed,  Surgical consent,  Pre-op evaluation,  At surgeon's request and post-op pain management  Laterality: Left  Prep: chloraprep       Needles:  Injection technique: Single-shot  Needle Type: Echogenic Needle     Needle Length: 10cm  Needle Gauge: 21     Additional Needles:   Narrative:  Start time: 01/29/2018 10:39 AM End time: 01/29/2018 10:44 AM Injection made incrementally with aspirations every 5 mL.  Performed by: Personally  Anesthesiologist: Audry Pili, MD  Additional Notes: No pain on injection. No increased resistance to injection. Injection made in 5cc increments. Good needle visualization. Patient tolerated the procedure well.

## 2018-01-29 NOTE — Anesthesia Preprocedure Evaluation (Addendum)
Anesthesia Evaluation  Patient identified by MRN, date of birth, ID band Patient awake    Reviewed: Allergy & Precautions, NPO status , Patient's Chart, lab work & pertinent test results  History of Anesthesia Complications Negative for: history of anesthetic complications  Airway Mallampati: II  TM Distance: >3 FB Neck ROM: Full    Dental  (+) Dental Advisory Given   Pulmonary former smoker,   2L continuous home O2    breath sounds clear to auscultation       Cardiovascular hypertension, Pt. on medications + CAD, + Past MI, + CABG, + Peripheral Vascular Disease and +CHF   Rhythm:Regular Rate:Normal   '19 TTE - Moderate to severe LVH. EF 60% to 65%. LA was severely dilated. The ventricular septum is flattend in diastole suggesting RV volume overload. The cavity size was moderately dilated. Systolic function was mildly reduced. RA was mildly dilated. Moderate TR, which was eccentric and may be underestimated. PASP was moderately increased. PA peak pressure: 55 mm Hg    Neuro/Psych  Headaches, PSYCHIATRIC DISORDERS (hx suicide attempt(s)) Depression TIACVA, No Residual Symptoms    GI/Hepatic negative GI ROS, (+)     substance abuse  marijuana use,   Endo/Other  diabetes, Type 2, Insulin DependentHypothyroidism  Obesity   Renal/GU CRFRenal disease     Musculoskeletal  (+) Arthritis ,  TMJ syndrome    Abdominal   Peds  Hematology  (+) anemia ,   Anesthesia Other Findings   Reproductive/Obstetrics                            Anesthesia Physical Anesthesia Plan  ASA: IV  Anesthesia Plan: General   Post-op Pain Management:  Regional for Post-op pain   Induction: Intravenous  PONV Risk Score and Plan: 3 and Treatment may vary due to age or medical condition, Ondansetron and Dexamethasone  Airway Management Planned: LMA  Additional Equipment: None  Intra-op Plan:    Post-operative Plan: Extubation in OR  Informed Consent: I have reviewed the patients History and Physical, chart, labs and discussed the procedure including the risks, benefits and alternatives for the proposed anesthesia with the patient or authorized representative who has indicated his/her understanding and acceptance.   Patient has DNR.  Discussed DNR with patient and Suspend DNR.   Dental advisory given  Plan Discussed with: CRNA and Anesthesiologist  Anesthesia Plan Comments:       Anesthesia Quick Evaluation

## 2018-01-29 NOTE — Anesthesia Procedure Notes (Signed)
Anesthesia Regional Block: Adductor canal block   Pre-Anesthetic Checklist: ,, timeout performed, Correct Patient, Correct Site, Correct Laterality, Correct Procedure, Correct Position, site marked, Risks and benefits discussed,  Surgical consent,  Pre-op evaluation,  At surgeon's request and post-op pain management  Laterality: Left  Prep: chloraprep       Needles:  Injection technique: Single-shot  Needle Type: Echogenic Needle     Needle Length: 10cm  Needle Gauge: 21     Additional Needles:   Narrative:  Start time: 01/29/2018 10:32 AM End time: 01/29/2018 10:36 AM Injection made incrementally with aspirations every 5 mL.  Performed by: Personally  Anesthesiologist: Audry Pili, MD  Additional Notes: No pain on injection. No increased resistance to injection. Injection made in 5cc increments. Good needle visualization. Patient tolerated the procedure well.

## 2018-01-29 NOTE — Progress Notes (Signed)
[facility] PROGRESS NOTE    Progress Note  [name] [name]  HDQ:[id number] DOB: December 10, 1945 DOA: 01/25/2018 PCP: Glenda Chroman, MD     Brief Narrative:   [name] [name] is an 73 y.o. female past medical history of peripheral vascular disease status post left femoral-popliteal bypass, Peripheral Vascular Atherectomy 11/04/15,CAD status post CABG, chronic diastolic heart failure, chronic kidney disease stage IV hypothyroidism insulin-dependent diabetes mellitus presents to the emergency room for evaluation of discoloration of her left foot and nonhealing left shin ulcer  Assessment/Plan:   Critical ischemia of extremity with history of revascularization of same extremity/left toe gangrene: With a history of PAD status post femoropopliteal on the left. Continue empiric antibiotics. Surgery was consulted they recommended above the knee amputation as leg is not salvageable scheduled for 01/29/2018 He has remained afebrile, with no leukocytosis.  Chronic diastolic heart failure: Appears to be compensated. KVO IV fluids patient on a diet resume, torsemide and potassium.  CAD: Denies any chest pain.  EKG was unrevealing. Continue statins, aspirin and Plavix have been held for anticipated amputation resume after surgical intervention. She had an episode of chest pain which resolved after 15 minutes likely positional cardiac biomarkers have remained flat, EKG unreaveling.  Insulin-dependent diabetes mellitus type 2: With an A1c of 6.2 back in March 2019. He is currently n.p.o. continue sliding scale and CBGs every 4.  Hypothyroidism: Continue Synthroid.  Chronic kidney disease stage IV: Her baseline creatinine is around 1.5-2.0 Creatinine is down from her baseline we will resume her torsemide. Restrict her fluids recheck a basic metabolic panel in the morning.  Sacral decubitus ulcer, stage II ([place]) Consulted wound care nurse.  RN Pressure Injury Documentation: Pressure Injury  03/31/17 Stage I -  Intact skin with non-blanchable redness of a localized area usually over a bony prominence. (Active)  03/31/17 1637  Location: Buttocks  Location Orientation: Right  Staging: Stage I -  Intact skin with non-blanchable redness of a localized area usually over a bony prominence.  Wound Description (Comments):   Present on Admission: Yes     Pressure Injury 01/25/18 Stage I -  Intact skin with non-blanchable redness of a localized area usually over a bony prominence. (Active)  01/25/18 2300  Location: Sacrum  Location Orientation:   Staging: Stage I -  Intact skin with non-blanchable redness of a localized area usually over a bony prominence.  Wound Description (Comments):   Present on Admission: Yes     DVT prophylaxis: lovexno Family Communication:none Disposition Plan/Barrier to D/C: unable to determine Code Status:     Code Status Orders  (From admission, onward)         Start     Ordered   01/25/18 2110  Do not attempt resuscitation (DNR)  Continuous    Question Answer Comment  In the event of cardiac or respiratory ARREST Do not call a "code blue"   In the event of cardiac or respiratory ARREST Do not perform Intubation, CPR, defibrillation or ACLS   In the event of cardiac or respiratory ARREST Use medication by any route, position, wound care, and other measures to relive pain and suffering. May use oxygen, suction and manual treatment of airway obstruction as needed for comfort.      01/25/18 2111        Code Status History    Date Active Date Inactive Code Status Order ID Comments User Context   03/31/2017 1748 04/04/2017 1508 Full Code [id number]  Isaac Bliss, Rayford Halsted, MD  Inpatient   07/28/2016 1538 08/03/2016 2104 Full Code [id number]  Leanor Kail, [place] Inpatient   11/04/2015 1459 11/05/2015 1629 Full Code [id number]  [name], MD Inpatient   09/14/2015 1627 09/21/2015 1852 Full Code [id number]  Conrad , NP Inpatient    07/23/2015 2135 08/01/2015 1716 DNR [id number]  Phillips Grout, MD Inpatient   10/11/2014 0431 10/19/2014 1635 DNR [id number]  Phillips Grout, MD Inpatient   09/03/2014 2334 09/12/2014 1701 DNR [id number]  Etta Quill, DO Inpatient   09/03/2014 2334 09/03/2014 2334 Full Code [id number]  Etta Quill, DO Inpatient   10/07/2013 1349 10/10/2013 1323 Full Code [id number]  Ulyses Amor, PA-C Inpatient   10/06/2013 1252 10/07/2013 1349 Full Code [id number]  [name], MD Inpatient   09/08/2013 1133 09/08/2013 1943 Full Code [id number]  [name], MD Inpatient   03/27/2013 1558 03/29/2013 1449 Full Code [id number]  Burnell Blanks, MD Inpatient   03/18/2013 1626 03/27/2013 1558 Full Code [id number]  Rogelia Mire, NP Inpatient   11/24/2010 1354 11/29/2010 1732 Full Code [id number]  Leandra Kern, RN Inpatient        IV Access:    Peripheral IV   Procedures and diagnostic studies:   No results found.   Medical Consultants:    None.  Anti-Infectives:   IV vancomycin and cefepime.  Subjective:    [name] [name] relates her pain is not controlled, otherwise she has no new complaints. Slightly nervous  Objective:    Vitals:   01/28/18 1409 01/28/18 1409 01/28/18 2000 01/29/18 0434  BP: (!) 146/86 (!) 146/86 (!) 164/94 (!) 169/44  Pulse: (!) 36 (!) 55 63 (!) 49  Resp: _0 Temp: 97.6 F (36.4 C) 97.6 F (36.4 C) 98.1 F (36.7 C) 98.9 F (37.2 C)  TempSrc: Oral Oral Oral Oral  SpO2: 99% 98% 95% 98%    Intake/Output Summary (Last 24 hours) at 01/29/2018 5436 Last data filed at 01/29/2018 0700 Gross per 24 hour  Intake 600 ml  Output 3900 ml  Net -3300 ml   There were no vitals filed for this visit.  Exam: General exam: Chronically ill-appearing Respiratory system: Good air movement and clear to auscultation. Cardiovascular system: S1 & S2 heard, RRR. No JVD. Gastrointestinal system: Abdomen is nondistended, soft and nontender.    Central nervous system: Alert and oriented. No focal neurological deficits. Extremities: No pedal edema. Skin: Atrophic and darted left great toe, with discoloration of her legs bilaterally with bleeding on the left more than on the right.,  Appears cyanotic cyanotic Psychiatry: Judgement and insight appear normal. Mood & affect appropriate.    Data Reviewed:    Labs: Basic Metabolic Panel: Recent Labs  Lab 01/25/18 1552 01/26/18 0418 01/28/18 0333 01/29/18 0246  NA 136 137 137 135  K 4.6 4.7 4.4 3.7  CL 99 106 105 97*  CO2 25 21* 23 26  GLUCOSE 185* 144* 119* 146*  BUN 44* 42* 36* 39*  CREATININE 2.02* 1.59* 1.14* 1.12*  CALCIUM 8.8* 8.5* 8.4* 8.8*   GFR Estimated Creatinine Clearance: 52.4 mL/min (A) (by C-G formula based on SCr of 1.12 mg/dL (H)). Liver Function Tests: Recent Labs  Lab 01/25/18 1552  AST 27  ALT 19  ALKPHOS 83  BILITOT 0.5  PROT 8.0  ALBUMIN 2.9*   Recent Labs  Lab 01/25/18 1552  LIPASE 31   No results for input(s): AMMONIA in the last 168 hours. Coagulation profile No  results for input(s): INR, PROTIME in the last 168 hours.  CBC: Recent Labs  Lab 01/25/18 1552 01/26/18 0418 01/28/18 0333 01/29/18 0246  WBC 10.6* 6.6 6.7 7.8  NEUTROABS 9.0* 5.3  --   --   HGB 10.6* 9.3* 9.0* 10.0*  HCT 36.3 33.0* 30.6* 32.7*  MCV 85.0 84.8 85.0 81.3  PLT 275 182 190 197   Cardiac Enzymes: Recent Labs  Lab 01/25/18 1552 01/25/18 1806 01/28/18 2055 01/29/18 0246  CKTOTAL 51  --   --   --   TROPONINI  --  <0.03 0.04* 0.04*   BNP (last 3 results) No results for input(s): PROBNP in the last 8760 hours. CBG: Recent Labs  Lab 01/28/18 0417 01/28/18 1121 01/28/18 1627 01/28/18 2159 01/29/18 0635  GLUCAP 112* 172* 173* 150* 131*   D-Dimer: No results for input(s): DDIMER in the last 72 hours. Hgb A1c: No results for input(s): HGBA1C in the last 72 hours. Lipid Profile: No results for input(s): CHOL, HDL, LDLCALC, TRIG, CHOLHDL,  LDLDIRECT in the last 72 hours. Thyroid function studies: No results for input(s): TSH, T4TOTAL, T3FREE, THYROIDAB in the last 72 hours.  Invalid input(s): FREET3 Anemia work up: No results for input(s): VITAMINB12, FOLATE, FERRITIN, TIBC, IRON, RETICCTPCT in the last 72 hours. Sepsis Labs: Recent Labs  Lab 01/25/18 1552 01/25/18 1559 01/25/18 1816 01/26/18 0418 01/28/18 0333 01/29/18 0246  WBC 10.6*  --   --  6.6 6.7 7.8  LATICACIDVEN  --  1.67 <0.30*  --   --   --    Microbiology Recent Results (from the past 240 hour(s))  Blood Culture (routine x 2)     Status: None (Preliminary result)   Collection Time: 01/25/18  3:40 PM  Result Value Ref Range Status   Specimen Description BLOOD RIGHT WRIST  Final   Special Requests   Final    BOTTLES DRAWN AEROBIC AND ANAEROBIC Blood Culture adequate volume   Culture   Final    NO GROWTH 3 DAYS Performed at [facility], [place] [address] [place], [place] [address]    Report Status PENDING  Incomplete  Blood Culture (routine x 2)     Status: None (Preliminary result)   Collection Time: 01/25/18  3:45 PM  Result Value Ref Range Status   Specimen Description BLOOD LEFT FOREARM  Final   Special Requests   Final    BOTTLES DRAWN AEROBIC AND ANAEROBIC Blood Culture adequate volume   Culture   Final    NO GROWTH 3 DAYS Performed at [facility], [place] [address] [place], [place] [address]    Report Status PENDING  Incomplete  MRSA PCR Screening     Status: None   Collection Time: 01/26/18  2:50 AM  Result Value Ref Range Status   MRSA by PCR NEGATIVE NEGATIVE Final    Comment:        The GeneXpert MRSA Assay (FDA approved for NASAL specimens only), is one component of a comprehensive MRSA colonization surveillance program. It is not intended to diagnose MRSA infection nor to guide or monitor treatment for MRSA infections. Performed at [facility], [place] [address], [place], [place] [address]   Surgical pcr  screen     Status: Abnormal   Collection Time: 01/29/18  1:44 AM  Result Value Ref Range Status   MRSA, PCR NEGATIVE NEGATIVE Final   Staphylococcus aureus POSITIVE (A) NEGATIVE Final    Comment: (NOTE) The Xpert SA Assay (FDA approved  for NASAL specimens in patients 62 years of age and older), is one component of a comprehensive surveillance program. It is not intended to diagnose infection nor to guide or monitor treatment.      Medications:   . atorvastatin  40 mg Oral q1800  . citalopram  20 mg Oral Daily  . feeding supplement (PRO-STAT SUGAR FREE 64)  30 mL Oral BID  . gabapentin  200 mg Oral Q12H  . insulin aspart  0-5 Units Subcutaneous QHS  . insulin aspart  0-9 Units Subcutaneous TID WC  . insulin aspart  3 Units Subcutaneous TID WC  . isosorbide mononitrate  30 mg Oral Daily  . levothyroxine  75 mcg Oral QAC breakfast  . multivitamin with minerals  1 tablet Oral Daily  . potassium chloride SA  20 mEq Oral BID  . sodium chloride flush  3 mL Intravenous Q12H  . sodium chloride flush  3 mL Intravenous Q12H  . torsemide  80 mg Oral BID   Continuous Infusions: . sodium chloride 250 mL (01/28/18 0251)  . ceFEPime (MAXIPIME) IV 2 g (01/29/18 0430)  . vancomycin 1,000 mg (01/28/18 1729)     LOS: 4 days   Charlynne Cousins  [facility]   *Please refer to [email or web address], password [email or web address] to get updated schedule on who will round on this patient, as hospitalists switch teams weekly. If 7PM-7AM, please contact night-coverage at [email or web address], password [email or web address] for any overnight needs.  01/29/2018, 9:22 AM

## 2018-01-29 NOTE — Interval H&P Note (Signed)
History and Physical Interval Note:  01/29/2018 10:29 AM  [name] [name]  has presented today for surgery, with the diagnosis of gangrene left lower extremity  The various methods of treatment have been discussed with the patient and family. After consideration of risks, benefits and other options for treatment, the patient has consented to  Procedure(s): AMPUTATION ABOVE KNEE (Left) as a surgical intervention .  The patient's history has been reviewed, patient examined, no change in status, stable for surgery.  I have reviewed the patient's chart and labs.  Questions were answered to the patient's satisfaction.     Deitra Mayo

## 2018-01-29 NOTE — Transfer of Care (Signed)
Immediate Anesthesia Transfer of Care Note  Patient: [name] [name]  Procedure(s) Performed: AMPUTATION ABOVE KNEE (Left )  Patient Location: PACU  Anesthesia Type:General  Level of Consciousness: awake, alert  and patient cooperative  Airway & Oxygen Therapy: Patient Spontanous Breathing and Patient connected to face mask oxygen  Post-op Assessment: Report given to RN and Post -op Vital signs reviewed and stable  Post vital signs: Reviewed and stable  Last Vitals:  Vitals Value Taken Time  BP 131/70 01/29/2018 12:48 PM  Temp    Pulse 53 01/29/2018 12:51 PM  Resp 6 01/29/2018 12:51 PM  SpO2 99 % 01/29/2018 12:51 PM  Vitals shown include unvalidated device data.  Last Pain:  Vitals:   01/29/18 0434  TempSrc: Oral  PainSc:       Patients Stated Pain Goal: 0 (19/47/12 5271)  Complications: No apparent anesthesia complications

## 2018-01-29 NOTE — Care Management Important Message (Signed)
Important Message  Patient Details  Name: [name] [name] MRN: [id number] Date of Birth: [birth year]   Medicare Important Message Given:  Yes    Orbie Pyo 01/29/2018, 10:21 AM

## 2018-01-29 NOTE — Op Note (Signed)
    NAME: [name] [name]    MRN: [id number] DOB: 1945/07/05    DATE OF OPERATION: 01/29/2018  PREOP DIAGNOSIS:    Gangrene left lower extremity  POSTOP DIAGNOSIS:    Same  PROCEDURE:    Left above-the-knee amputation  SURGEON: Judeth Cornfield. Scot Dock, MD, FACS  ASSIST: Luanne Bras, RNFA  ANESTHESIA: Regional block  EBL: 100 cc  INDICATIONS:    [name] [name] is a 73 y.o. female who had nonsalvageable wounds of her left leg and left above-the-knee amputation was recommended.  FINDINGS:   There was excellent bleeding at this level.  TECHNIQUE:   The patient was taken to the operating room and sedated by anesthesia.  The patient had received a regional block.  The left leg was prepped and draped in usual sterile fashion.  A fishmouth incision was marked above the patella.  A tourniquet had been placed on the upper thigh.  The leg was exsanguinated with an Esmarch bandage and the tourniquet inflated to 300 mmHg.  Under tourniquet control, the incision was carried down to the skin, subcutaneous tissue, fascia and muscle to the femur which was dissected free circumferentially.  The periosteum was elevated and then the bone divided proximal to the level of skin division.  The femoral artery and vein were individually suture ligated with 2-0 silk ties.  The tourniquet was then released.  Additional hemostasis was obtained using 2-0 silk ties and electrocautery.  The edges of the bone were rasped.  Bone wax was placed because of bleeding from the bone marrow.  The wound was irrigated.  There was excellent bleeding noted.  The fascia was closed with interrupted 2-0 Vicryl's.  The skin was closed with staples.  A sterile dressing was applied.  The patient tolerated the procedure well and was transferred to the recovery room in stable condition.  All needle and sponge counts were correct.  Deitra Mayo, MD, FACS [facility]  DATE OF DICTATION:    01/29/2018

## 2018-01-29 NOTE — Anesthesia Procedure Notes (Signed)
Procedure Name: LMA Insertion Date/Time: 01/29/2018 11:49 AM Performed by: Teressa Lower., CRNA Pre-anesthesia Checklist: Patient identified, Emergency Drugs available, Suction available and Patient being monitored Patient Re-evaluated:Patient Re-evaluated prior to induction Oxygen Delivery Method: Circle system utilized Preoxygenation: Pre-oxygenation with 100% oxygen Induction Type: IV induction LMA: LMA inserted LMA Size: 4.0 Number of attempts: 1 Placement Confirmation: positive ETCO2 and breath sounds checked- equal and bilateral Tube secured with: Tape Dental Injury: Teeth and Oropharynx as per pre-operative assessment

## 2018-01-30 ENCOUNTER — Encounter (HOSPITAL_COMMUNITY): Payer: Self-pay | Admitting: Vascular Surgery

## 2018-01-30 DIAGNOSIS — E1151 Type 2 diabetes mellitus with diabetic peripheral angiopathy without gangrene: Secondary | ICD-10-CM

## 2018-01-30 DIAGNOSIS — Z89612 Acquired absence of left leg above knee: Secondary | ICD-10-CM

## 2018-01-30 DIAGNOSIS — E1149 Type 2 diabetes mellitus with other diabetic neurological complication: Secondary | ICD-10-CM

## 2018-01-30 DIAGNOSIS — Z959 Presence of cardiac and vascular implant and graft, unspecified: Secondary | ICD-10-CM

## 2018-01-30 DIAGNOSIS — I998 Other disorder of circulatory system: Secondary | ICD-10-CM

## 2018-01-30 LAB — BASIC METABOLIC PANEL
Anion gap: 10 (ref 5–15)
BUN: 46 mg/dL — ABNORMAL HIGH (ref 8–23)
CALCIUM: 8.4 mg/dL — AB (ref 8.9–10.3)
CO2: 26 mmol/L (ref 22–32)
Chloride: 96 mmol/L — ABNORMAL LOW (ref 98–111)
Creatinine, Ser: 1.5 mg/dL — ABNORMAL HIGH (ref 0.44–1.00)
GFR calc Af Amer: 40 mL/min — ABNORMAL LOW (ref >60)
GFR calc non Af Amer: 35 mL/min — ABNORMAL LOW (ref >60)
Glucose, Bld: 272 mg/dL — ABNORMAL HIGH (ref 70–99)
Potassium: 4.7 mmol/L (ref 3.5–5.1)
Sodium: 132 mmol/L — ABNORMAL LOW (ref 135–145)

## 2018-01-30 LAB — CBC
HCT: 28.7 % — ABNORMAL LOW (ref 36.0–46.0)
Hemoglobin: 8.6 g/dL — ABNORMAL LOW (ref 12.0–15.0)
MCH: 24.6 pg — ABNORMAL LOW (ref 26.0–34.0)
MCHC: 30 g/dL (ref 30.0–36.0)
MCV: 82.2 fL (ref 80.0–100.0)
PLATELETS: 183 10*3/uL (ref 150–400)
RBC: 3.49 MIL/uL — ABNORMAL LOW (ref 3.87–5.11)
RDW: 19.7 % — ABNORMAL HIGH (ref 11.5–15.5)
WBC: 10.2 10*3/uL (ref 4.0–10.5)
nRBC: 0 % (ref 0.0–0.2)

## 2018-01-30 LAB — CULTURE, BLOOD (ROUTINE X 2)
CULTURE: NO GROWTH
Culture: NO GROWTH
SPECIAL REQUESTS: ADEQUATE
Special Requests: ADEQUATE

## 2018-01-30 LAB — GLUCOSE, CAPILLARY
Glucose-Capillary: 182 mg/dL — ABNORMAL HIGH (ref 70–99)
Glucose-Capillary: 152 mg/dL — ABNORMAL HIGH (ref 70–99)
Glucose-Capillary: 159 mg/dL — ABNORMAL HIGH (ref 70–99)
Glucose-Capillary: 115 mg/dL — ABNORMAL HIGH (ref 70–99)

## 2018-01-30 MED ORDER — SODIUM CHLORIDE 0.9 % IV SOLN
2.0000 g | INTRAVENOUS | Status: AC
  Administered 2018-01-31: 2 g via INTRAVENOUS
  Filled 2018-01-30: qty 2

## 2018-01-30 NOTE — Progress Notes (Signed)
Pharmacy Antibiotic Note  [name] [name] is a 73 y.o. female admitted on 01/25/2018 with cellulitis/foot gangrene.  Pharmacy has been consulted for vancomycin and cefepime dosing.  Patient is now s/p amputation with plan to stop antibiotics 48 hours post-op per Dr. Scot Dock.  Renal function is worsening again, afebrile, WBC WNL.  Plan: Continue vanc 1g IV Q24H for AUC 451 Reduce cefepime to 2gm IV Q24H Monitor renal fxn, clinical progress Add stop date 01/31/18 at 1300 to antibiotics order   Height: 5' 6" (167.6 cm) IBW/kg (Calculated) : 59.3  Temp (24hrs), Avg:97.6 F (36.4 C), Min:97.3 F (36.3 C), Max:98.1 F (36.7 C)  Recent Labs  Lab 01/25/18 1552 01/25/18 1559 01/25/18 1816 01/26/18 0418 01/28/18 0333 01/29/18 0246 01/30/18 0202  WBC 10.6*  --   --  6.6 6.7 7.8 10.2  CREATININE 2.02*  --   --  1.59* 1.14* 1.12* 1.50*  LATICACIDVEN  --  1.67 <0.30*  --   --   --   --     Estimated Creatinine Clearance: 39.1 mL/min (A) (by C-G formula based on SCr of 1.5 mg/dL (H)).    Allergies  Allergen Reactions  . Other Shortness Of Breath, Rash and Other (See Comments)    CANNOT EAT ANY BERRIES!!  . Strawberry Extract Hives, Shortness Of Breath and Rash  . Sulfa Antibiotics Swelling and Other (See Comments)    Bodily Swelling  . Coffee Bean Extract [Coffea Arabica] Itching and Rash  . Tape Other (See Comments)    Tears skin.  Please use "paper" tape only.    Vanc 1/17>> Cefepime 1/17>> Flagyl 1/17>>1/19  1/18 MRSA PCR - negative 1/17 BCx - negative  Edson Deridder D. Mina Marble, PharmD, BCPS, [place] 01/30/2018, 9:37 AM

## 2018-01-30 NOTE — Progress Notes (Signed)
Received call from Dr. Aileen Fass regarding telemetry. Ok to discontinue.

## 2018-01-30 NOTE — Evaluation (Signed)
Physical Therapy Evaluation Patient Details Name: COURTNAY PETRILLA MRN: [id number] DOB: 1945-08-27 Today's Date: 01/30/2018   History of Present Illness  73 y.o. female with medical history significant for peripheral arterial disease status post left femoral-popliteal bypass, CAD status post CABG, chronic diastolic CHF, chronic kidney disease stage IV, hypothyroidism, insulin-dependent diabetes mellitus, and chronic back pain.  She presented to the emergency department for evaluation of discoloration of her left foot and a nonhealing wound on the left shin.  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     Clinical Impression  Pt admitted with above diagnosis. Pt currently with functional limitations due to the deficits listed below (see PT Problem List). On eval, pt required total assist bed mobility. Mobility significantly limited by pain. Pt only able to tolerate minimal ROM/exercise BLE. Pt will benefit from skilled PT to increase their independence and safety with mobility to allow discharge to the venue listed below.       Follow Up Recommendations SNF    Equipment Recommendations  Other (comment)(TBD at next venue)    Recommendations for Other Services       Precautions / Restrictions Precautions Precautions: Fall      Mobility  Bed Mobility Overal bed mobility: Needs Assistance Bed Mobility: Rolling Rolling: Total assist         General bed mobility comments: use of bed pads for rolling in bed. Pt unable to tolerate sup to sit due to pain.  Transfers                 General transfer comment: unable  Ambulation/Gait             General Gait Details: unable  Stairs            Wheelchair Mobility    Modified Rankin (Stroke Patients Only)       Balance                                             Pertinent Vitals/Pain Pain Assessment: 0-10 Pain Score: 8  Pain Location: LLE Pain Descriptors / Indicators:  Grimacing;Guarding;Aching;Sore Pain Intervention(s): Limited activity within patient's tolerance;Patient requesting pain meds-RN notified    Home Living Family/patient expects to be discharged to:: Private residence Living Arrangements: Other relatives(granddaughter) Available Help at Discharge: Family;Available 24 hours/day Type of Home: House Home Access: Stairs to enter Entrance Stairs-Rails: None Entrance Stairs-Number of Steps: 3 Home Layout: One level Home Equipment: Walker - 4 wheels;Shower seat;Bedside commode;Cane - single point;Walker - 2 wheels      Prior Function Level of Independence: Needs assistance   Gait / Transfers Assistance Needed: Ambulates household distances with rollator  ADL's / Homemaking Assistance Needed: assisted by family        Hand Dominance   Dominant Hand: Right    Extremity/Trunk Assessment   Upper Extremity Assessment Upper Extremity Assessment: Defer to OT evaluation    Lower Extremity Assessment Lower Extremity Assessment: LLE deficits/detail;RLE deficits/detail RLE Deficits / Details: limited ROM  RLE: Unable to fully assess due to pain LLE Deficits / Details: s/p AKA    Cervical / Trunk Assessment Cervical / Trunk Assessment: Kyphotic  Communication   Communication: No difficulties  Cognition Arousal/Alertness: Awake/alert Behavior During Therapy: WFL for tasks assessed/performed Overall Cognitive Status: No family/caregiver present to determine baseline cognitive functioning  General Comments: Pt A&Ox 3. Follows commands and answers questions appropriately.      General Comments      Exercises     Assessment/Plan    PT Assessment Patient needs continued PT services  PT Problem List Decreased strength;Decreased balance;Decreased mobility;Decreased activity tolerance;Pain;Decreased skin integrity       PT Treatment Interventions DME instruction;Functional mobility  training;Balance training;Patient/family education;Therapeutic activities;Therapeutic exercise    PT Goals (Current goals can be found in the Care Plan section)  Acute Rehab PT Goals Patient Stated Goal: decrease pain PT Goal Formulation: With patient Time For Goal Achievement: 02/13/18 Potential to Achieve Goals: Fair    Frequency Min 2X/week   Barriers to discharge        Co-evaluation               AM-PAC PT "6 Clicks" Mobility  Outcome Measure Help needed turning from your back to your side while in a flat bed without using bedrails?: Total Help needed moving from lying on your back to sitting on the side of a flat bed without using bedrails?: Total Help needed moving to and from a bed to a chair (including a wheelchair)?: Total Help needed standing up from a chair using your arms (e.g., wheelchair or bedside chair)?: Total Help needed to walk in hospital room?: Total Help needed climbing 3-5 steps with a railing? : Total 6 Click Score: 6    End of Session   Activity Tolerance: Patient limited by pain Patient left: in bed;with call bell/phone within reach Nurse Communication: Mobility status;Patient requests pain meds PT Visit Diagnosis: Other abnormalities of gait and mobility (R26.89);Pain Pain - Right/Left: Left Pain - part of body: Leg    Time: 4114-6431 PT Time Calculation (min) (ACUTE ONLY): 14 min   Charges:   PT Evaluation $PT Eval Moderate Complexity: 1 Mod          Lorrin Goodell, PT  Office # [phone] Pager [phone]   Lorriane Shire 01/30/2018, 1:39 PM

## 2018-01-30 NOTE — Progress Notes (Addendum)
CSW lvm with daughter Apolonio Schneiders due to patient's lack of full orientation.   CSW will continue to follow up with family for SNF facility placement choice.   [place], [place]

## 2018-01-30 NOTE — Progress Notes (Signed)
Inpatient Diabetes Program Recommendations  AACE/ADA: New Consensus Statement on Inpatient Glycemic Control   Target Ranges:  Prepandial:   less than 140 mg/dL      Peak postprandial:   less than 180 mg/dL (1-2 hours)      Critically ill patients:  140 - 180 mg/dL   Results for [name] [name], [name] [name] (MRN [id number]) as of 01/30/2018 07:40  Ref. Range 01/29/2018 06:35 01/29/2018 09:22 01/29/2018 12:48 01/29/2018 15:53 01/29/2018 21:43 01/30/2018 05:39  Glucose-Capillary Latest Ref Range: 70 - 99 mg/dL 131 (H) 145 (H) 184 (H) 260 (H) 321 (H) 182 (H)  Results for [name] [name], [name] [name] (MRN [id number]) as of 01/30/2018 07:40  Ref. Range 03/31/2017 13:33  Hemoglobin A1C Latest Ref Range: 4.8 - 5.6 % 6.2 (H)   Review of Glycemic Control  Diabetes history: DM2 Outpatient Diabetes medications: Levemir 10 units QAM, Januvia 25 mg daily Current orders for Inpatient glycemic control: Novolog 0-9 units TID with meals, Novolog 0-5 units QHS, Novolog 3 units TID with meals for meal coverage   Inpatient Diabetes Program Recommendations:  HbgA1C: Please consider ordering an A1C to evaluate glycemic control over the past 2-3 months  NOTE: In reviewing chart, noted patient received Decadron 10 mg x 1 on 01/29/18 at 11:15 which is contributing to hyperglycemia. Anticipate glucose to improve within the next 24 hours since no other steroids are ordered.  Thanks, Barnie Alderman, RN, MSN, CDE Diabetes Coordinator Inpatient Diabetes Program [phone] (Team Pager from 8am to 5pm)

## 2018-01-30 NOTE — Progress Notes (Signed)
   VASCULAR SURGERY ASSESSMENT & PLAN:   1 Day Post-Op s/p: Left above-the-knee amputation.  She had some bloody drainage on her dressing so I changed her dressing this morning.  This is healing adequately.  Currently no active bleeding.  Change dressing daily and as needed.   SUBJECTIVE:   Requesting pain medicine this morning.  PHYSICAL EXAM:   Vitals:   01/29/18 1700 01/29/18 2022 01/30/18 0002 01/30/18 0437  BP:  (!) 123/57 (!) 128/52 (!) 125/59  Pulse:  (!) 57 (!) 57 (!) 59  Resp:  _0 Temp:  97.8 F (36.6 C) 97.6 F (36.4 C) (!) 97.4 F (36.3 C)  TempSrc:  Oral Oral Oral  SpO2:  95% 97% 92%  Height: _1  (1.676 m)      I changed her dressing this morning.  She had some bloody drainage on it but the wound looks good with some mild oozing.    LABS:   Lab Results  Component Value Date   WBC 10.2 01/30/2018   HGB 8.6 (L) 01/30/2018   HCT 28.7 (L) 01/30/2018   MCV 82.2 01/30/2018   PLT 183 01/30/2018   Lab Results  Component Value Date   CREATININE 1.50 (H) 01/30/2018   CBG (last 3)  Recent Labs    01/29/18 1553 01/29/18 2143 01/30/18 0539  GLUCAP 260* 321* 182*    PROBLEM LIST:    Principal Problem:   Critical ischemia of extremity with history of revascularization of same extremity Active Problems:   Carotid artery disease (HCC)   CKD (chronic kidney disease), stage IV (HCC)   Acquired hypothyroidism   Chronic diastolic CHF (congestive heart failure) ([place])   Uncontrolled type 2 diabetes mellitus with hyperglycemia (HCC)   Sacral decubitus ulcer, stage II (HCC)   CURRENT MEDS:   . atorvastatin  40 mg Oral q1800  . citalopram  20 mg Oral Daily  . docusate sodium  100 mg Oral Daily  . enoxaparin (LOVENOX) injection  40 mg Subcutaneous Q24H  . feeding supplement (PRO-STAT SUGAR FREE 64)  30 mL Oral BID  . gabapentin  200 mg Oral Q12H  . insulin aspart  0-5 Units Subcutaneous QHS  . insulin aspart  0-9 Units Subcutaneous TID WC  .  insulin aspart  3 Units Subcutaneous TID WC  . isosorbide mononitrate  30 mg Oral Daily  . levothyroxine  75 mcg Oral QAC breakfast  . multivitamin with minerals  1 tablet Oral Daily  . pantoprazole  40 mg Oral Daily  . potassium chloride SA  20 mEq Oral BID  . sodium chloride flush  3 mL Intravenous Q12H  . sodium chloride flush  3 mL Intravenous Q12H  . torsemide  80 mg Oral BID    Deitra Mayo Beeper: [phone] Office: [phone] 01/30/2018

## 2018-01-30 NOTE — Progress Notes (Signed)
PROGRESS NOTE  [name] [name] ZOX:[id number] DOB: [birth year] DOA: 01/25/2018 PCP: Glenda Chroman, MD   LOS: 5 days   Brief Narrative / Interim history: [name] [name] is an 73 y.o. female past medical history of peripheral vascular disease status post left femoral-popliteal bypass, Peripheral Vascular Atherectomy10/26/17,CAD status post CABG, chronic diastolic heart failure, chronic kidney disease stage IV hypothyroidism insulin-dependent diabetes mellitus presents to the emergency room for evaluation of discoloration of her left foot and nonhealing left shin ulcer  Subjective: -Complains of pain 8/10, no nausea or vomiting, no fevers  Assessment & Plan: Principal Problem:   Critical ischemia of extremity with history of revascularization of same extremity Active Problems:   Carotid artery disease (HCC)   CKD (chronic kidney disease), stage IV (HCC)   Acquired hypothyroidism   Chronic diastolic CHF (congestive heart failure) ([place])   Uncontrolled type 2 diabetes mellitus with hyperglycemia ([place])   Sacral decubitus ulcer, stage II ([place])   Principal Problem Critical ischemia of extremity with history of revascularization of same extremity/left toe gangrene -With a history of PAD status post femoropopliteal on the left. -Continue empiric antibiotics.  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  Active Problems Chronic diastolic heart failure -Appears to be compensated.  Creatinine slightly up but still within her baseline, hold diuretics today  CAD -Denies any chest pain.  EKG was unrevealing. -Continue statins, aspirin and Plavix have been held for anticipated amputation resume after surgery per vascular -She had an episode of chest pain which resolved after 15 minutes likely positional cardiac biomarkers have remained flat, EKG unreaveling.  Insulin-dependent diabetes mellitus type 2 -With an A1c of 6.2 back in March  2019. -Continue sliding scale, fasting this morning 182  Hypothyroidism -Continue Synthroid  Chronic kidney disease stage IV -Her baseline creatinine is around 1.5-2.0 -Creatinine 1.2 in the last couple of days, down from her baseline, 1.5 this morning.  She looks euvolemic, will stop diuretics postop.  Resume in 1 to 2 days  Sacral decubitus ulcer, stage II ([place]) -Consulted wound care nurse.  RN Pressure Injury Documentation: Pressure Injury 03/31/17 Stage I -  Intact skin with non-blanchable redness of a localized area usually over a bony prominence. (Active)  03/31/17 1637  Location: Buttocks  Location Orientation: Right  Staging: Stage I -  Intact skin with non-blanchable redness of a localized area usually over a bony prominence.  Wound Description (Comments):   Present on Admission: Yes     Pressure Injury 01/25/18 Stage I -  Intact skin with non-blanchable redness of a localized area usually over a bony prominence. (Active)  01/25/18 2300  Location: Sacrum  Location Orientation:   Staging: Stage I -  Intact skin with non-blanchable redness of a localized area usually over a bony prominence.  Wound Description (Comments):   Present on Admission: Yes     Scheduled Meds: . atorvastatin  40 mg Oral q1800  . citalopram  20 mg Oral Daily  . docusate sodium  100 mg Oral Daily  . enoxaparin (LOVENOX) injection  40 mg Subcutaneous Q24H  . feeding supplement (PRO-STAT SUGAR FREE 64)  30 mL Oral BID  . gabapentin  200 mg Oral Q12H  . insulin aspart  0-5 Units Subcutaneous QHS  . insulin aspart  0-9 Units Subcutaneous TID WC  . insulin aspart  3 Units Subcutaneous TID WC  . isosorbide mononitrate  30 mg Oral Daily  . levothyroxine  75 mcg Oral  QAC breakfast  . multivitamin with minerals  1 tablet Oral Daily  . pantoprazole  40 mg Oral Daily  . potassium chloride SA  20 mEq Oral BID  . sodium chloride flush  3 mL Intravenous Q12H  . sodium chloride flush  3 mL Intravenous  Q12H  . torsemide  80 mg Oral BID   Continuous Infusions: . sodium chloride Stopped (01/29/18 0912)  . sodium chloride Stopped (01/29/18 1701)  . [START ON 01/31/2018] ceFEPime (MAXIPIME) IV    . lactated ringers 10 mL/hr at 01/29/18 1002  . magnesium sulfate 1 - 4 g bolus IVPB    . vancomycin Stopped (01/29/18 1801)   PRN Meds:.sodium chloride, acetaminophen **OR** acetaminophen, alum & mag hydroxide-simeth, fentaNYL (SUBLIMAZE) injection, guaiFENesin-dextromethorphan, hydrALAZINE, HYDROmorphone (DILAUDID) injection, labetalol, magnesium sulfate 1 - 4 g bolus IVPB, metoprolol tartrate, nitroGLYCERIN, ondansetron **OR** ondansetron (ZOFRAN) IV, oxyCODONE-acetaminophen, phenol, polyethylene glycol, potassium chloride, sodium chloride flush, tiZANidine  DVT prophylaxis: Lovenox Code Status: DNR Family Communication: son at bedside Disposition Plan: TBD, PT eval pending  Consultants:   Vascular surgery   Procedures:   AKA left 1/21  Antimicrobials:  Vancomycin   Cefepime    Objective: Vitals:   01/29/18 1700 01/29/18 2022 01/30/18 0002 01/30/18 0437  BP:  (!) 123/57 (!) 128/52 (!) 125/59  Pulse:  (!) 57 (!) 57 (!) 59  Resp:  _0 Temp:  97.8 F (36.6 C) 97.6 F (36.4 C) (!) 97.4 F (36.3 C)  TempSrc:  Oral Oral Oral  SpO2:  95% 97% 92%  Height: _1  (1.676 m)       Intake/Output Summary (Last 24 hours) at 01/30/2018 1115 Last data filed at 01/30/2018 0900 Gross per 24 hour  Intake 3865.04 ml  Output 1350 ml  Net 2515.04 ml   There were no vitals filed for this visit.  Examination:  Constitutional: NAD Eyes: lids and conjunctivae normal ENMT: Mucous membranes are moist. No oropharyngeal exudates Neck: normal, supple Respiratory: clear to auscultation bilaterally, no wheezing, no crackles.  Cardiovascular: Regular rate and rhythm, no murmurs / rubs / gallops. No LE edema. 2 Abdomen: no tenderness. Bowel sounds positive.  Musculoskeletal: no clubbing /  cyanosis. Skin: no rashes Neurologic: CN 2-12 grossly intact. Strength 5/5 in all 4.  Psychiatric: Normal judgment and insight. Alert and oriented x 3. Normal mood.    Data Reviewed: I have independently reviewed following labs and imaging studies   CBC: Recent Labs  Lab 01/25/18 1552 01/26/18 0418 01/28/18 0333 01/29/18 0246 01/30/18 0202  WBC 10.6* 6.6 6.7 7.8 10.2  NEUTROABS 9.0* 5.3  --   --   --   HGB 10.6* 9.3* 9.0* 10.0* 8.6*  HCT 36.3 33.0* 30.6* 32.7* 28.7*  MCV 85.0 84.8 85.0 81.3 82.2  PLT 275 182 190 197 397   Basic Metabolic Panel: Recent Labs  Lab 01/25/18 1552 01/26/18 0418 01/28/18 0333 01/29/18 0246 01/30/18 0202  NA 136 137 137 135 132*  K 4.6 4.7 4.4 3.7 4.7  CL 99 106 105 97* 96*  CO2 25 21* _2 GLUCOSE 185* 144* 119* 146* 272*  BUN 44* 42* 36* 39* 46*  CREATININE 2.02* 1.59* 1.14* 1.12* 1.50*  CALCIUM 8.8* 8.5* 8.4* 8.8* 8.4*   GFR: Estimated Creatinine Clearance: 39.1 mL/min (A) (by C-G formula based on SCr of 1.5 mg/dL (H)). Liver Function Tests: Recent Labs  Lab 01/25/18 1552  AST 27  ALT 19  ALKPHOS 83  BILITOT 0.5  PROT 8.0  ALBUMIN 2.9*   Recent Labs  Lab 01/25/18 1552  LIPASE 31   No results for input(s): AMMONIA in the last 168 hours. Coagulation Profile: No results for input(s): INR, PROTIME in the last 168 hours. Cardiac Enzymes: Recent Labs  Lab 01/25/18 1552 01/25/18 1806 01/28/18 2055 01/29/18 0246  CKTOTAL 51  --   --   --   TROPONINI  --  <0.03 0.04* 0.04*   BNP (last 3 results) No results for input(s): PROBNP in the last 8760 hours. HbA1C: No results for input(s): HGBA1C in the last 72 hours. CBG: Recent Labs  Lab 01/29/18 0922 01/29/18 1248 01/29/18 1553 01/29/18 2143 01/30/18 0539  GLUCAP 145* 184* 260* 321* 182*   Lipid Profile: No results for input(s): CHOL, HDL, LDLCALC, TRIG, CHOLHDL, LDLDIRECT in the last 72 hours. Thyroid Function Tests: No results for input(s): TSH, T4TOTAL,  FREET4, T3FREE, THYROIDAB in the last 72 hours. Anemia Panel: No results for input(s): VITAMINB12, FOLATE, FERRITIN, TIBC, IRON, RETICCTPCT in the last 72 hours. Urine analysis:    Component Value Date/Time   COLORURINE YELLOW 01/25/2018 2015   APPEARANCEUR HAZY (A) 01/25/2018 2015   LABSPEC 1.012 01/25/2018 2015   PHURINE 5.0 01/25/2018 2015   GLUCOSEU NEGATIVE 01/25/2018 2015   HGBUR MODERATE (A) 01/25/2018 2015   BILIRUBINUR NEGATIVE 01/25/2018 2015   KETONESUR NEGATIVE 01/25/2018 2015   PROTEINUR NEGATIVE 01/25/2018 2015   UROBILINOGEN 0.2 10/10/2014 2345   NITRITE NEGATIVE 01/25/2018 2015   LEUKOCYTESUR NEGATIVE 01/25/2018 2015   Sepsis Labs: Invalid input(s): PROCALCITONIN, LACTICIDVEN  Recent Results (from the past 240 hour(s))  Blood Culture (routine x 2)     Status: None   Collection Time: 01/25/18  3:40 PM  Result Value Ref Range Status   Specimen Description BLOOD RIGHT WRIST  Final   Special Requests   Final    BOTTLES DRAWN AEROBIC AND ANAEROBIC Blood Culture adequate volume   Culture   Final    NO GROWTH 5 DAYS Performed at [facility], [address], [place], [place] [address]    Report Status 01/30/2018 FINAL  Final  Blood Culture (routine x 2)     Status: None   Collection Time: 01/25/18  3:45 PM  Result Value Ref Range Status   Specimen Description BLOOD LEFT FOREARM  Final   Special Requests   Final    BOTTLES DRAWN AEROBIC AND ANAEROBIC Blood Culture adequate volume   Culture   Final    NO GROWTH 5 DAYS Performed at [facility], [place] [address] [place], [place] [address]    Report Status 01/30/2018 FINAL  Final  MRSA PCR Screening     Status: None   Collection Time: 01/26/18  2:50 AM  Result Value Ref Range Status   MRSA by PCR NEGATIVE NEGATIVE Final    Comment:        The GeneXpert MRSA Assay (FDA approved for NASAL specimens only), is one component of a comprehensive MRSA colonization surveillance program. It is not intended  to diagnose MRSA infection nor to guide or monitor treatment for MRSA infections. Performed at [facility], [place] [address], [place], [place] [address]   Surgical pcr screen     Status: Abnormal   Collection Time: 01/29/18  1:44 AM  Result Value Ref Range Status   MRSA, PCR NEGATIVE NEGATIVE Final   Staphylococcus aureus POSITIVE (A) NEGATIVE Final    Comment: (NOTE) The Xpert SA Assay (FDA approved for NASAL specimens in  patients 64 years of age and older), is one component of a comprehensive surveillance program. It is not intended to diagnose infection nor to guide or monitor treatment.       Radiology Studies: No results found.   Marzetta Board, MD, PhD [facility]  Contact via  [email or web address]  [place] P: [phone]  F: [phone]

## 2018-01-30 NOTE — Plan of Care (Signed)
  Problem: Clinical Measurements: Goal: Ability to maintain clinical measurements within normal limits will improve Outcome: Progressing Goal: Will remain free from infection Outcome: Progressing Goal: Diagnostic test results will improve Outcome: Progressing Goal: Cardiovascular complication will be avoided Outcome: Progressing

## 2018-01-30 NOTE — Evaluation (Signed)
Occupational Therapy Evaluation Patient Details Name: [name] [name] MRN: [id number] DOB: 06/13/1945 Today's Date: 01/30/2018    History of Present Illness 73 y.o. female with medical history significant for peripheral arterial disease status post left femoral-popliteal bypass, CAD status post CABG, chronic diastolic CHF, chronic kidney disease stage IV, hypothyroidism, insulin-dependent diabetes mellitus, and chronic back pain.  She presented to the emergency department for evaluation of discoloration of her left foot and a nonhealing wound on the left shin.  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    Clinical Impression   PTA Pt required assist for ADL and used rollator for mobility in home. Pt is currently max A +2 for bed mobility and able to sit EOB for approx 15 min to complete grooming with set up. Max A to wash back. Pt will benefit from skilled OT in the acute setting as well as afterwards at the SNF level. Next session work on [place]  Will need drop arm recliner or will need to do AP transfer to recliner.     Follow Up Recommendations  SNF;Supervision/Assistance - 24 hour    Equipment Recommendations  Other (comment)(defer to next venue of care)    Recommendations for Other Services       Precautions / Restrictions Precautions Precautions: Fall Restrictions Weight Bearing Restrictions: Yes LLE Weight Bearing: Non weight bearing      Mobility Bed Mobility Overal bed mobility: Needs Assistance Bed Mobility: Rolling;Sidelying to Sit;Sit to Supine Rolling: Max assist;+2 for physical assistance;+2 for safety/equipment Sidelying to sit: Max assist;+2 for physical assistance;+2 for safety/equipment;HOB elevated   Sit to supine: Total assist;+2 for physical assistance;+2 for safety/equipment   General bed mobility comments: use of pads to assist, Pt eagerly assisting with arms as able. Excellent grasp  Transfers                 General transfer comment: NT this session     Balance Overall balance assessment: Needs assistance Sitting-balance support: Bilateral upper extremity supported;Feet supported(foot*) Sitting balance-Leahy Scale: Poor Sitting balance - Comments: initially max A able to progress to min guard seated balance Postural control: Posterior lean                                 ADL either performed or assessed with clinical judgement   ADL Overall ADL's : Needs assistance/impaired Eating/Feeding: Set up;Sitting   Grooming: Wash/dry face;Brushing hair;Minimal assistance;Sitting Grooming Details (indicate cue type and reason): EOB, assist for balance, and back of head Upper Body Bathing: Moderate assistance   Lower Body Bathing: Maximal assistance   Upper Body Dressing : Moderate assistance   Lower Body Dressing: Total assistance   Toilet Transfer: Total assistance   Toileting- Clothing Manipulation and Hygiene: Maximal assistance;Bed level         General ADL Comments: new AKA, decreased activity tolerance, decreased balance, increased pain     Vision Patient Visual Report: No change from baseline       Perception     Praxis      Pertinent Vitals/Pain Pain Assessment: 0-10 Pain Score: 8  Pain Location: LLE Pain Descriptors / Indicators: Grimacing;Guarding;Aching;Sore Pain Intervention(s): Monitored during session;Repositioned     Hand Dominance Right   Extremity/Trunk Assessment Upper Extremity Assessment Upper Extremity Assessment: Overall WFL for tasks assessed   Lower Extremity Assessment Lower Extremity Assessment: Defer to PT evaluation RLE Deficits / Details: limited ROM  RLE: Unable to fully assess due to  pain LLE Deficits / Details: s/p AKA   Cervical / Trunk Assessment Cervical / Trunk Assessment: Kyphotic   Communication Communication Communication: No difficulties(speech takes extra effort)   Cognition Arousal/Alertness: Awake/alert Behavior During Therapy: WFL for tasks  assessed/performed Overall Cognitive Status: No family/caregiver present to determine baseline cognitive functioning                                 General Comments: Pt A&Ox 3. Follows commands and answers questions appropriately.   General Comments  Pt eager to participate with therapy and maximize independence    Exercises     Shoulder Instructions      Home Living Family/patient expects to be discharged to:: Private residence Living Arrangements: Other relatives(granddaiughter) Available Help at Discharge: Family;Available 24 hours/day Type of Home: House Home Access: Stairs to enter [employer] of Steps: 3 Entrance Stairs-Rails: None Home Layout: One level     Bathroom Shower/Tub: Teacher, early years/pre: Standard     Home Equipment: Environmental consultant - 4 wheels;Shower seat;Bedside commode;Cane - single point;Walker - 2 wheels          Prior Functioning/Environment Level of Independence: Needs assistance  Gait / Transfers Assistance Needed: Ambulates household distances with rollator ADL's / Homemaking Assistance Needed: assisted by family for bathing; dressed herself            OT Problem List: Decreased activity tolerance;Impaired balance (sitting and/or standing);Decreased knowledge of use of DME or AE;Decreased knowledge of precautions;Pain      OT Treatment/Interventions: Self-care/ADL training;DME and/or AE instruction;Therapeutic activities;Patient/family education;Balance training    OT Goals(Current goals can be found in the care plan section) Acute Rehab OT Goals Patient Stated Goal: decrease pain OT Goal Formulation: With patient Time For Goal Achievement: 02/13/18 Potential to Achieve Goals: Good ADL Goals Pt Will Perform Grooming: with modified independence;sitting Pt Will Transfer to Toilet: with max assist;squat pivot transfer Pt Will Perform Toileting - Clothing Manipulation and hygiene: with mod  assist;sitting/lateral leans Additional ADL Goal #1: Pt perform bed mobility at min A prior to engaging in ADL  OT Frequency: Min 2X/week   Barriers to D/C:            Co-evaluation              AM-PAC OT "6 Clicks" Daily Activity     Outcome Measure Help from another person eating meals?: None Help from another person taking care of personal grooming?: A Lot Help from another person toileting, which includes using toliet, bedpan, or urinal?: Total Help from another person bathing (including washing, rinsing, drying)?: A Lot Help from another person to put on and taking off regular upper body clothing?: A Little Help from another person to put on and taking off regular lower body clothing?: Total 6 Click Score: 13   End of Session Equipment Utilized During Treatment: Oxygen(2.5 L) Nurse Communication: Mobility status;Precautions;Weight bearing status  Activity Tolerance: Patient tolerated treatment well Patient left: in bed;with call bell/phone within reach  OT Visit Diagnosis: Other abnormalities of gait and mobility (R26.89);Pain Pain - Right/Left: Left Pain - part of body: Leg                Time: 1354-1426 OT Time Calculation (min): 32 min Charges:  OT General Charges $OT Visit: 1 Visit OT Evaluation $OT Eval Moderate Complexity: 1 Mod OT Treatments $Self Care/Home Management : 8-22 mins  Hulda Humphrey OTR/L Acute Rehabilitation  Services Pager: [phone] Office: [place] 01/30/2018, 3:44 PM

## 2018-01-30 NOTE — NC FL2 (Signed)
[place] LEVEL OF CARE SCREENING TOOL     IDENTIFICATION  Patient Name: [name] [name] Birthdate: 1945-05-04 Sex: female Admission Date (Current Location): 01/25/2018  [facility] and [place] Number:  [employer] and Address:  [facility] [place] [place] [address] [place], [place] [address]      Provider Number: [id number]  Attending Physician Name and Address:  Caren Griffins, MD  Relative Name and Phone Number:  [place]andy ([email or web address])[phone]    Current Level of Care: Hospital Recommended Level of Care: [place] Prior Approval Number:    Date Approved/Denied: 10/17/14 PASRR Number: [id number]  Discharge Plan: SNF    Current Diagnoses: Patient Active Problem List   Diagnosis Date Noted  . Sacral decubitus ulcer, stage II ([place]) 01/26/2018  . 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 Occlusion of femoropopliteal bypass graft (HCC)   . Toe gangrene ([place])   . Pressure injury of skin 04/04/2017  . Acute on chronic diastolic CHF (congestive heart failure) ([place]) 03/31/2017  . Uncontrolled type 2 diabetes mellitus with hyperglycemia ([place]) 10/27/2016  . Class 1 obesity due to excess calories with serious comorbidity and body mass index (BMI) of 31.0 to 31.9 in adult 10/27/2016  . Chronic diastolic CHF (congestive heart failure) ([place]) 07/28/2016  . Acquired hypothyroidism 03/17/2016  . Subacute thyroiditis 11/26/2015  . Claudication ([place]) 11/04/2015  . Acute diastolic (congestive) heart failure ([place]) 09/14/2015  . CKD (chronic kidney disease), stage IV ([place]) 07/27/2015  . Pleural effusion   . Recurrent pleural effusion on right   . Bradycardia, sinus 10/13/2014  . Palliative care encounter 10/13/2014  . Hypoglycemia 10/12/2014  . Anemia 10/12/2014  . Toe ulcer ([place]) 10/12/2014  . Unresponsive episode 10/12/2014  . Pulmonary hypertension ([place])   . Chest pain   .  Pleural effusion, right 09/04/2014  . Pressure ulcer 09/04/2014  . CHF (congestive heart failure) ([place]) 09/04/2014  . Hypertensive urgency 09/03/2014  . PVD (peripheral vascular disease) ([place]) 11/10/2013  . Critical lower limb ischemia 10/06/2013  . Chronic combined systolic and diastolic heart failure ([place]) 03/18/2013  . Diabetes mellitus type II, uncontrolled ([place]) 01/13/2011  . Hx of CABG 2012   . PAD (peripheral artery disease) ([place])   . Carotid artery disease ([place])   . Essential hypertension 01/14/2010  . Mixed hyperlipidemia 11/11/2009  . TOBACCO ABUSE 11/11/2009    Orientation RESPIRATION BLADDER Height & Weight     Self, Place  O2(nasal cannula 2L/min) Incontinent, External catheter Weight:   Height:  _0  (167.6 cm)  BEHAVIORAL SYMPTOMS/MOOD NEUROLOGICAL BOWEL NUTRITION STATUS      Continent Diet(see discharge summary)  AMBULATORY STATUS COMMUNICATION OF NEEDS Skin   Extensive Assist Verbally Other (Comment)(skin tear left buttocks, left leg amputation, pressure injury sacrum, left thigh closed surgical incision)                       Personal Care Assistance Level of Assistance  Bathing, Feeding, Dressing, Total care Bathing Assistance: Maximum assistance Feeding assistance: Independent Dressing Assistance: Maximum assistance Total Care Assistance: Maximum assistance   Functional Limitations Info  Sight, Hearing, Speech Sight Info: Impaired Hearing Info: Impaired(hearing aids for both ears) Speech Info: Adequate    SPECIAL CARE FACTORS FREQUENCY  PT (By licensed PT), OT (By licensed OT)     PT Frequency: min 5x weekly OT Frequency: min 5x weekly  Contractures Contractures Info: Not present    Additional Factors Info  Code Status, Allergies Code Status Info: DNR Allergies Info: Berries, strawberry extract, Sulfa Antibiotics, Coffee Bean Extract (coffea arabica), tape           Current Medications (01/30/2018):  This is the current  hospital active medication list Current Facility-Administered Medications  Medication Dose Route Frequency Provider Last Rate Last Dose  . 0.9 %  sodium chloride infusion  250 mL Intravenous PRN Angelia Mould, MD   Stopped at 01/29/18 0912  . acetaminophen (TYLENOL) tablet 650 mg  650 mg Oral Q6H PRN Angelia Mould, MD   650 mg at 01/26/18 6222   Or  . acetaminophen (TYLENOL) suppository 650 mg  650 mg Rectal Q6H PRN Angelia Mould, MD      . alum & mag hydroxide-simeth (MAALOX/MYLANTA) 200-200-20 MG/5ML suspension 15-30 mL  15-30 mL Oral Q2H PRN Angelia Mould, MD      . atorvastatin (LIPITOR) tablet 40 mg  40 mg Oral q1800 Angelia Mould, MD   40 mg at 01/29/18 1704  . [START ON 01/31/2018] ceFEPIme (MAXIPIME) 2 g in sodium chloride 0.9 % 100 mL IVPB  2 g Intravenous Q24H Angelia Mould, MD      . citalopram (CELEXA) tablet 20 mg  20 mg Oral Daily Angelia Mould, MD   20 mg at 01/30/18 0816  . docusate sodium (COLACE) capsule 100 mg  100 mg Oral Daily Angelia Mould, MD   100 mg at 01/30/18 0816  . enoxaparin (LOVENOX) injection 40 mg  40 mg Subcutaneous Q24H Angelia Mould, MD   40 mg at 01/30/18 0813  . feeding supplement (PRO-STAT SUGAR FREE 64) liquid 30 mL  30 mL Oral BID Angelia Mould, MD   30 mL at 01/30/18 9798  . fentaNYL (SUBLIMAZE) injection 25-50 mcg  25-50 mcg Intravenous Q2H PRN Angelia Mould, MD   50 mcg at 01/30/18 1059  . gabapentin (NEURONTIN) capsule 200 mg  200 mg Oral Q12H Angelia Mould, MD   200 mg at 01/30/18 0817  . guaiFENesin-dextromethorphan (ROBITUSSIN DM) 100-10 MG/5ML syrup 15 mL  15 mL Oral Q4H PRN Angelia Mould, MD      . hydrALAZINE (APRESOLINE) injection 5 mg  5 mg Intravenous Q20 Min PRN Angelia Mould, MD      . HYDROmorphone (DILAUDID) injection 0.5-1 mg  0.5-1 mg Intravenous Q2H PRN Angelia Mould, MD   1 mg at 01/30/18 0810  . insulin  aspart (novoLOG) injection 0-5 Units  0-5 Units Subcutaneous QHS Angelia Mould, MD   4 Units at 01/29/18 2200  . insulin aspart (novoLOG) injection 0-9 Units  0-9 Units Subcutaneous TID WC Angelia Mould, MD   2 Units at 01/30/18 1301  . insulin aspart (novoLOG) injection 3 Units  3 Units Subcutaneous TID WC Angelia Mould, MD   3 Units at 01/30/18 1301  . isosorbide mononitrate (IMDUR) 24 hr tablet 30 mg  30 mg Oral Daily Angelia Mould, MD   30 mg at 01/30/18 9211  . labetalol (NORMODYNE,TRANDATE) injection 10 mg  10 mg Intravenous Q10 min PRN Angelia Mould, MD      . lactated ringers infusion   Intravenous Continuous Angelia Mould, MD 10 mL/hr at 01/29/18 1002    . levothyroxine (SYNTHROID, LEVOTHROID) tablet 75 mcg  75 mcg Oral QAC breakfast Angelia Mould, MD   75 mcg at 01/30/18 0610  .  magnesium sulfate IVPB 2 g 50 mL  2 g Intravenous Once PRN Angelia Mould, MD      . metoprolol tartrate (LOPRESSOR) injection 2-5 mg  2-5 mg Intravenous Q2H PRN Angelia Mould, MD      . multivitamin with minerals tablet 1 tablet  1 tablet Oral Daily Angelia Mould, MD   1 tablet at 01/30/18 0809  . nitroGLYCERIN (NITROSTAT) SL tablet 0.4 mg  0.4 mg Sublingual Q5 min PRN Angelia Mould, MD   0.4 mg at 01/28/18 2032  . ondansetron (ZOFRAN) tablet 4 mg  4 mg Oral Q6H PRN Angelia Mould, MD       Or  . ondansetron [facility]) injection 4 mg  4 mg Intravenous Q6H PRN Angelia Mould, MD      . oxyCODONE-acetaminophen (PERCOCET/ROXICET) 5-325 MG per tablet 1-2 tablet  1-2 tablet Oral Q4H PRN Angelia Mould, MD   2 tablet at 01/30/18 1302  . pantoprazole (PROTONIX) EC tablet 40 mg  40 mg Oral Daily Angelia Mould, MD   40 mg at 01/30/18 0820  . phenol (CHLORASEPTIC) mouth spray 1 spray  1 spray Mouth/Throat PRN Angelia Mould, MD      . polyethylene glycol (MIRALAX / GLYCOLAX) packet 17 g   17 g Oral Daily PRN Angelia Mould, MD      . potassium chloride SA (K-DUR,KLOR-CON) CR tablet 20-40 mEq  20-40 mEq Oral Once PRN Angelia Mould, MD      . sodium chloride flush (NS) 0.9 % injection 3 mL  3 mL Intravenous Q12H Angelia Mould, MD   3 mL at 01/28/18 1101  . sodium chloride flush (NS) 0.9 % injection 3 mL  3 mL Intravenous Q12H Angelia Mould, MD   3 mL at 01/29/18 2200  . sodium chloride flush (NS) 0.9 % injection 3 mL  3 mL Intravenous PRN Angelia Mould, MD      . tiZANidine (ZANAFLEX) tablet 2 mg  2 mg Oral Q8H PRN Angelia Mould, MD   2 mg at 01/30/18 1102  . vancomycin (VANCOCIN) IVPB 1000 mg/200 mL premix  1,000 mg Intravenous Q24H Angelia Mould, MD   Stopped at 01/29/18 1801     Discharge Medications: Please see discharge summary for a list of discharge medications.  Relevant Imaging Results:  Relevant Lab Results:   Additional Information SSN: [id number]  Alberteen Sam, LCSW

## 2018-01-30 NOTE — Consult Note (Signed)
Physical Medicine and Rehabilitation Consult Reason for Consult:  Decreased functional mobility Referring Physician:  [facility]   HPI: [name] [name] is a 73 y.o.right handed female with history of diastolic congestive heart failure, CAD with CABG, chronic kidney disease stage IV, insulin-dependent diabetes mellitus, remote tobacco abuse, peripheral vascular disease with multiple revascularization procedures. Per chart review patient lives with granddaughter in [place]. Used a walker prior to admission. One level home with 5 steps to entry. Granddaughter and local daughter and son-in-law work.Presented on 01/25/2018 with nonhealing left shin wound and ischemic changes, she was orthostatic 89/56  As well as bradycardia 40s to 50s.Chest x-ray negative for active disease. Troponin negative. CT abdomen pelvis negative for acute changes. Vascular surgery consulted for nonsalvageable wounds of left leg. 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 Deitra Mayo. Hospital course pain management. Acute blood loss anemia 8.6. maintained on Lovenox for DVT prophylaxis. Therapy evaluations pending. M.D. has requested physical medicine rehabilitation consult.  Patient also noted to have tremor she states that she was going to see a doctor about this but then was in the hospital.  She also says that she never had this before the hospital. Spoke with patient's nurse patient received fentanyl as well as Dilaudid this morning.  Patient still having episodes of pain  Review of Systems  Constitutional: Negative for chills and fever.  HENT: Negative for hearing loss.   Eyes: Negative for blurred vision and double vision.  Respiratory: Positive for shortness of breath.   Cardiovascular: Positive for leg swelling. Negative for chest pain and palpitations.  Gastrointestinal: Positive for constipation. Negative for nausea and vomiting.  Genitourinary: Negative for dysuria, flank pain and hematuria.    Musculoskeletal: Positive for back pain, joint pain and myalgias.  Skin: Negative for rash.  Psychiatric/Behavioral: Positive for depression.  All other systems reviewed and are negative.  Past Medical History:  Diagnosis Date  . Arthritis   . Carotid artery disease (HCC)    a. Bilateral CEA; 50-60% bilateral ICA stenosis, 11/12  . Cellulitis    a. Recurrent, bilateral legs  . CHF (congestive heart failure) ([place])   . Chronic back pain   . Chronic diastolic heart failure ([place])   . Coronary atherosclerosis of native coronary artery    a. 11/2010 NSTEMI/CABG x 4: L-LAD; S-PL; S-OM; S-DX, by PVT.  [name] Critical lower limb ischemia   . Depression   . Essential hypertension, benign   . Herniated disc   . History of blood transfusion   . Hypothyroidism   . Ischemic cardiomyopathy    a. 11/2010 TEE: EF 40-45%.  . Migraine   . Mixed hyperlipidemia   . NSTEMI (non-ST elevated myocardial infarction) ([place]) 11/2010  . On home oxygen therapy   . PAD (peripheral artery disease) ([place])   . Pleural effusion   . Pneumonia 2000's X 2  . Stroke ([place]) 08/2009  . Suicide attempt ([place]) 08/2002  . TIA (transient ischemic attack)   . TMJ syndrome   . Type 2 diabetes mellitus ([place])   . Venous insufficiency    Past Surgical History:  Procedure Laterality Date  . ABDOMINAL HYSTERECTOMY    . APPENDECTOMY    . CARDIAC CATHETERIZATION  11/2010  . CARDIAC CATHETERIZATION N/A 09/16/2015   Procedure: Right Heart Cath;  Surgeon: Larey Dresser, MD;  Location: [facility];  Service: Cardiovascular;  Laterality: N/A;  . CAROTID ENDARTERECTOMY Bilateral 2011   Bilateral - Dr. Kellie Simmering  .  CHOLECYSTECTOMY    . COLONOSCOPY W/ BIOPSIES AND POLYPECTOMY    . CORONARY ANGIOPLASTY WITH STENT PLACEMENT  03/2013   "1"  . CORONARY ARTERY BYPASS GRAFT  11/24/2010   Procedure: CORONARY ARTERY BYPASS GRAFTING (CABG);  Surgeon: Tharon Aquas Adelene Idler, MD;  Location: [facility];  Service: Open Heart Surgery;  Laterality: N/A;   Coronary Artery Bypass Graft times four on pump utilizing left internal mammary artery and bilateral greater saphenous veins harvested endoscopically, transesophageal echocardiogram   . DEBRIDEMENT TOE Left    "nonhealing wound; 3rd digit  . DILATION AND CURETTAGE OF UTERUS    . ENDARTERECTOMY POPLITEAL Left 10/07/2013   Procedure: LEFT POPLITEAL ENDARTERECTOMY ;  Surgeon: Angelia Mould, MD;  Location: [facility];  Service: Vascular;  Laterality: Left;  . FEMORAL-POPLITEAL BYPASS GRAFT Left 10/07/2013   Procedure: LEFT FEMORAL-POPLITEAL ARTERY BYPASS GRAFT;  Surgeon: Angelia Mould, MD;  Location: [facility];  Service: Vascular;  Laterality: Left;  . FRACTURE SURGERY    . INTRAOPERATIVE ARTERIOGRAM Left 10/07/2013   Procedure: INTRA OPERATIVE ARTERIOGRAM LEFT LEG;  Surgeon: Angelia Mould, MD;  Location: [facility];  Service: Vascular;  Laterality: Left;  . IR GENERIC HISTORICAL  09/21/2015   IR REMOVAL OF PLURAL CATH W/CUFF 09/21/2015 Marybelle Killings, MD [facility]-INTERV RAD  . LEFT AND RIGHT HEART CATHETERIZATION WITH CORONARY/GRAFT ANGIOGRAM N/A 03/27/2013   Procedure: LEFT AND RIGHT HEART CATHETERIZATION WITH Beatrix Fetters;  Surgeon: Burnell Blanks, MD;  Location[facility];  Service: Cardiovascular;  Laterality: N/A;  . LOWER EXTREMITY ANGIOGRAM  09/08/2012; 10/06/2013   "found 100% blockage; unsuccessful attempt at crossing a chronic total occlusion of the left SFA in the setting of critical limb ischemia  . LOWER EXTREMITY ANGIOGRAM Bilateral 09/08/2013   Procedure: LOWER EXTREMITY ANGIOGRAM;  Surgeon: [name], MD;  Location: [facility] CATH LAB;  Service: Cardiovascular;  Laterality: Bilateral;  . PERIPHERAL VASCULAR CATHETERIZATION  11/04/2015   Procedure: Peripheral Vascular Atherectomy;  Surgeon: [name], MD;  Location: [facility];  Service: Cardiovascular;;  RT. SFA  . PERIPHERAL VASCULAR CATHETERIZATION Bilateral 11/04/2015   Procedure: Lower Extremity  Intervention;  Surgeon: [name], MD;  Location: [facility];  Service: Cardiovascular;  Laterality: Bilateral;  . PERIPHERAL VASCULAR CATHETERIZATION  11/04/2015   Procedure: Peripheral Vascular Balloon Angioplasty;  Surgeon: [name], MD;  Location: [facility];  Service: Cardiovascular;;  TP Trunk  . TOE SURGERY Left    "put pin in 2nd toe"  . TUBAL LIGATION    . WRIST FRACTURE SURGERY Left    "grafted bone from hip to wrist"   Family History  Problem Relation Age of Onset  . Coronary artery disease Father        Died with MI age 8  . Heart disease Father        before age 78  . Heart attack Father   . Diabetes type II Mother   . Hypertension Mother   . Diabetes Mother   . Heart disease Mother   . Hyperlipidemia Mother   . Heart failure Sister   . Cancer Sister   . Cancer Brother        Lung cancer  . Diabetes Daughter   . Hyperlipidemia Daughter   . Diabetes Son   . Hyperlipidemia Son    Social History:  reports that she quit smoking about 8 years ago. Her smoking use included cigarettes. She started smoking about 62 years ago.  She has a 150.00 pack-year smoking history. She has never used smokeless tobacco. She reports current alcohol use. She reports current drug use. Drug: Marijuana. Allergies:  Allergies  Allergen Reactions  . Other Shortness Of Breath, Rash and Other (See Comments)    CANNOT EAT ANY BERRIES!!  . Strawberry Extract Hives, Shortness Of Breath and Rash  . Sulfa Antibiotics Swelling and Other (See Comments)    Bodily Swelling  . Coffee Bean Extract [Coffea Arabica] Itching and Rash  . Tape Other (See Comments)    Tears skin.  Please use "paper" tape only.   Medications Prior to Admission  Medication Sig Dispense Refill  . aspirin 81 MG EC tablet Take 81 mg by mouth daily.    [name] atorvastatin (LIPITOR) 40 MG tablet TAKE 1 TABLET BY MOUTH ONCE DAILY AT  6  PM (Patient taking differently: Take 40 mg by mouth daily. ) 90  tablet 0  . citalopram (CELEXA) 20 MG tablet Take 20 mg by mouth daily.     . clopidogrel (PLAVIX) 75 MG tablet TAKE 1 TABLET BY MOUTH ONCE DAILY WITH BREAKFAST (Patient taking differently: Take 75 mg by mouth daily. ) 90 tablet 0  . docusate sodium (COLACE) 100 MG capsule Take 200 mg by mouth at bedtime as needed for mild constipation.    . ferrous sulfate 325 (65 FE) MG tablet Take 325 mg by mouth 2 (two) times daily.    [name] gabapentin (NEURONTIN) 100 MG capsule Take 200 mg by mouth every 12 (twelve) hours.    . insulin detemir (LEVEMIR) 100 UNIT/ML injection Inject 10 Units into the skin daily with breakfast.    . isosorbide mononitrate (IMDUR) 30 MG 24 hr tablet TAKE 1 TABLET BY MOUTH ONCE DAILY (Patient taking differently: Take 30 mg by mouth daily. ) 90 tablet 1  . JANUVIA 25 MG tablet TAKE 1 TABLET BY MOUTH ONCE DAILY 30 tablet 3  . levothyroxine (SYNTHROID, LEVOTHROID) 75 MCG tablet Take 1 tablet (75 mcg total) by mouth daily before breakfast.    . Multiple Vitamin (MULTIVITAMIN WITH MINERALS) TABS tablet Take 1 tablet by mouth daily.    . nitroGLYCERIN (NITROSTAT) 0.4 MG SL tablet Place 1 tablet (0.4 mg total) under the tongue every 5 (five) minutes as needed for chest pain. Up to 3 doses. If no relief after 3rd dose, proceed to the ED for an evaluation 25 tablet 3  . nystatin cream (MYCOSTATIN) Apply 1 application topically 2 (two) times daily.    [name] oxyCODONE (OXY IR/ROXICODONE) 5 MG immediate release tablet Take 5 mg by mouth 4 (four) times daily as needed for severe pain.    . potassium chloride SA (K-DUR,KLOR-CON) 20 MEQ tablet TAKE 1 TABLET BY MOUTH TWICE DAILY (Patient taking differently: Take 20 mEq by mouth 2 (two) times daily. ) 180 tablet 0  . tiZANidine (ZANAFLEX) 2 MG tablet Take 2 mg by mouth every 8 (eight) hours as needed for muscle spasms.     [name] torsemide (DEMADEX) 20 MG tablet Take 4 tablets (80 mg total) by mouth 2 (two) times daily. 720 tablet 3  . traMADol (ULTRAM) 50 MG  tablet Take 50 mg by mouth 3 (three) times daily.     [name] acetaminophen (TYLENOL) 500 MG tablet Take 500 mg by mouth every 6 (six) hours as needed for mild pain or moderate pain.       Home: Home Living Family/patient expects to be discharged to:: Private residence Living Arrangements: Other relatives  Functional History:  Functional Status:  Mobility:          ADL:    Cognition: Cognition Orientation Level: Oriented to person, Oriented to place, Oriented to situation    Blood pressure (!) 125/59, pulse (!) 59, temperature (!) 97.4 F (36.3 C), temperature source Oral, resp. rate 14, height _0  (1.676 m), SpO2 92 %. Physical Exam  Vitals reviewed. HENT:  Head: Normocephalic.  Neck: Normal range of motion. Neck supple. No thyromegaly present.  Cardiovascular: Normal rate and regular rhythm.  Respiratory: Effort normal and breath sounds normal.  GI: Soft. Bowel sounds are normal. She exhibits no distension.  Musculoskeletal:     Comments: No pain with upper extremity range of motion no pain with right lower extremity range of motion.  Neurological: She is alert.  Patient is alert and pleasant. Followed commands. Provides her name and age.  Skin:  Left AKA is dressed, appropriately tender  Right pretibial area has stasis dermatitis changes as well as abrasion  Strength is 4/5 in both deltoid bicep tricep grip 4/5 in the right hip flexor knee extensor and 3 at the right ankle dorsiflexor. Sensation reduced to light touch in the right foot. Patient has intention tremor in bilateral upper extremities no tremor noted in the right lower extremity Patient has pain with even small movements of the left residual limb.  Results for orders placed or performed during the hospital encounter of 01/25/18 (from the past 24 hour(s))  Glucose, capillary     Status: Abnormal   Collection Time: 01/29/18  6:35 AM  Result Value Ref Range   Glucose-Capillary 131 (H) 70 - 99 mg/dL    Glucose, capillary     Status: Abnormal   Collection Time: 01/29/18  9:22 AM  Result Value Ref Range   Glucose-Capillary 145 (H) 70 - 99 mg/dL  Glucose, capillary     Status: Abnormal   Collection Time: 01/29/18 12:48 PM  Result Value Ref Range   Glucose-Capillary 184 (H) 70 - 99 mg/dL   Comment 1 Notify RN    Comment 2 Document in Chart   Glucose, capillary     Status: Abnormal   Collection Time: 01/29/18  3:53 PM  Result Value Ref Range   Glucose-Capillary 260 (H) 70 - 99 mg/dL  Glucose, capillary     Status: Abnormal   Collection Time: 01/29/18  9:43 PM  Result Value Ref Range   Glucose-Capillary 321 (H) 70 - 99 mg/dL   Comment 1 Notify RN   Basic metabolic panel     Status: Abnormal   Collection Time: 01/30/18  2:02 AM  Result Value Ref Range   Sodium 132 (L) 135 - 145 mmol/L   Potassium 4.7 3.5 - 5.1 mmol/L   Chloride 96 (L) 98 - 111 mmol/L   CO2 26 22 - 32 mmol/L   Glucose, Bld 272 (H) 70 - 99 mg/dL   BUN 46 (H) 8 - 23 mg/dL   Creatinine, Ser 1.50 (H) 0.44 - 1.00 mg/dL   Calcium 8.4 (L) 8.9 - 10.3 mg/dL   GFR calc non Af Amer 35 (L) >60 mL/min   GFR calc Af Amer 40 (L) >60 mL/min   Anion gap 10 5 - 15  CBC     Status: Abnormal   Collection Time: 01/30/18  2:02 AM  Result Value Ref Range   WBC 10.2 4.0 - 10.5 K/uL   RBC 3.49 (L) 3.87 - 5.11 MIL/uL   Hemoglobin 8.6 (L) 12.0 - 15.0 g/dL  HCT 28.7 (L) 36.0 - 46.0 %   MCV 82.2 80.0 - 100.0 fL   MCH 24.6 (L) 26.0 - 34.0 pg   MCHC 30.0 30.0 - 36.0 g/dL   RDW 19.7 (H) 11.5 - 15.5 %   Platelets 183 150 - 400 K/uL   nRBC 0.0 0.0 - 0.2 %  Glucose, capillary     Status: Abnormal   Collection Time: 01/30/18  5:39 AM  Result Value Ref Range   Glucose-Capillary 182 (H) 70 - 99 mg/dL   Comment 1 Notify RN    No results found.   Assessment/Plan: Diagnosis: Left above-knee amputation postoperative day #1 1. Does the need for close, 24 hr/day medical supervision in concert with the patient's rehab needs make it  unreasonable for this patient to be served in a less intensive setting? Potentially 2. Co-Morbidities requiring supervision/potential complications: Diastolic CHF, moderate, CKD 4, insulin-dependent diabetes mellitus with peripheral neuropathy, peripheral vascular disease associated with diabetes, history of coronary artery disease, tremor 3. Due to bladder management, bowel management, safety, skin/wound care, disease management, medication administration, pain management and patient education, does the patient require 24 hr/day rehab nursing? Yes 4. Does the patient require coordinated care of a physician, rehab nurse, PT (1-2 hrs/day, 5 days/week) and OT (1-2 hrs/day, 5 days/week) to address physical and functional deficits in the context of the above medical diagnosis(es)? Yes Addressing deficits in the following areas: balance, endurance, locomotion, strength, transferring, bowel/bladder control, bathing, dressing, feeding, grooming, toileting, cognition and psychosocial support 5. Can the patient actively participate in an intensive therapy program of at least 3 hrs of therapy per day at least 5 days per week? Potentially 6. The potential for patient to make measurable gains while on inpatient rehab is Guarded, awaiting PT OT evaluations 7. Anticipated functional outcomes upon discharge from inpatient rehab are n/a  with PT, n/a with OT, n/a with SLP. 8. Estimated rehab length of stay to reach the above functional goals is: Deferred pending further evaluation 9. Anticipated D/C setting: Home versus SNF 10. Anticipated post D/C treatments: N/A 11. Overall Rehab/Functional Prognosis: fair  RECOMMENDATIONS: This patient's condition is appropriate for continued rehabilitative care in the following setting: If patient requiring total assist will likely need SNF, multiple comorbidities may interfere with her ability to participate in a more intensive program. Patient has agreed to participate in  recommended program. Potentially Note that insurance prior authorization may be required for reimbursement for recommended care.  Comment:  "I have personally performed a face to face diagnostic evaluation of this patient.  Additionally, I have reviewed and concur with the physician assistant's documentation above." Charlett Blake M.D. [facility] FAAPM&R (Sports Med, Neuromuscular Med) Diplomate Am Board of Electrodiagnostic Med  Elizabeth Sauer 01/30/2018

## 2018-01-31 LAB — CBC
HCT: 26.3 % — ABNORMAL LOW (ref 36.0–46.0)
Hemoglobin: 7.8 g/dL — ABNORMAL LOW (ref 12.0–15.0)
MCH: 24.8 pg — ABNORMAL LOW (ref 26.0–34.0)
MCHC: 29.7 g/dL — ABNORMAL LOW (ref 30.0–36.0)
MCV: 83.8 fL (ref 80.0–100.0)
Platelets: 164 10*3/uL (ref 150–400)
RBC: 3.14 MIL/uL — ABNORMAL LOW (ref 3.87–5.11)
RDW: 20.2 % — ABNORMAL HIGH (ref 11.5–15.5)
WBC: 7.6 10*3/uL (ref 4.0–10.5)
nRBC: 0 % (ref 0.0–0.2)

## 2018-01-31 LAB — BASIC METABOLIC PANEL
Anion gap: 9 (ref 5–15)
BUN: 52 mg/dL — AB (ref 8–23)
CO2: 26 mmol/L (ref 22–32)
CREATININE: 1.62 mg/dL — AB (ref 0.44–1.00)
Calcium: 8 mg/dL — ABNORMAL LOW (ref 8.9–10.3)
Chloride: 99 mmol/L (ref 98–111)
GFR calc Af Amer: 37 mL/min — ABNORMAL LOW (ref >60)
GFR, EST NON AFRICAN AMERICAN: 32 mL/min — AB (ref >60)
Glucose, Bld: 139 mg/dL — ABNORMAL HIGH (ref 70–99)
Potassium: 3.8 mmol/L (ref 3.5–5.1)
Sodium: 134 mmol/L — ABNORMAL LOW (ref 135–145)

## 2018-01-31 LAB — GLUCOSE, CAPILLARY
Glucose-Capillary: 127 mg/dL — ABNORMAL HIGH (ref 70–99)
Glucose-Capillary: 159 mg/dL — ABNORMAL HIGH (ref 70–99)
Glucose-Capillary: 145 mg/dL — ABNORMAL HIGH (ref 70–99)
Glucose-Capillary: 128 mg/dL — ABNORMAL HIGH (ref 70–99)

## 2018-01-31 LAB — PREPARE RBC (CROSSMATCH)

## 2018-01-31 MED ORDER — ENSURE ENLIVE PO LIQD
237.0000 mL | Freq: Two times a day (BID) | ORAL | Status: DC
  Administered 2018-02-01 (×2): 237 mL via ORAL

## 2018-01-31 MED ORDER — SODIUM CHLORIDE 0.9% IV SOLUTION: Freq: Once | INTRAVENOUS | Status: DC

## 2018-01-31 NOTE — Clinical Social Work Note (Signed)
Clinical Social Work Assessment  Patient Details  Name: [name] [name] MRN: [id number] Date of Birth: 11-15-1945  Date of referral:  01/31/18               Reason for consult:  Discharge Planning                Permission sought to share information with:  Facility Sport and exercise psychologist, Case Manager, Family Supports Permission granted to share information::  Yes, Release of Information Signed  Name::     Financial planner::  SNFs  Relationship::  daughter  Contact Information:  [phone]  Housing/Transportation Living arrangements for the past 2 months:  [place] of Information:  Patient Patient Interpreter Needed:  None Criminal Activity/Legal Involvement Pertinent to Current Situation/Hospitalization:  No - Comment as needed Significant Relationships:  Adult Children Lives with:  Self Do you feel safe going back to the place where you live?  No Need for family participation in patient care:  Yes (Comment)  Care giving concerns:  CSW received referral for possible SNF placement at time of discharge. Spoke with patient regarding possibility of SNF placement . Patient's  Daughter [name] [name]  is currently unable to care for her at their home given patient's current needs and fall risk.  Patient and  Daughter expressed understanding of PT recommendation and are agreeable to SNF placement at time of discharge. CSW to continue to follow and assist with discharge planning needs.     Social Worker assessment / plan:  Spoke with patient and daughter [name] [name] concerning possibility of rehab at [facility] before returning home.    Employment status:  Retired Forensic scientist:  Medicare PT Recommendations:  [place] / Referral to community resources:  [place]  Patient/Family's Response to care:  Patient and daughter [name] [name] recognize need for rehab before returning home and are agreeable to a SNF in [place].  They report preference for [facility]  . CSW explained insurance authorization process. Patient's family reported that they want patient to get stronger to be able to come back home.    Patient/Family's Understanding of and Emotional Response to Diagnosis, Current Treatment, and Prognosis:  Patient/family is realistic regarding therapy needs and expressed being hopeful for SNF placement. Patient expressed understanding of CSW role and discharge process as well as medical condition. No questions/concerns about plan or treatment.    Emotional Assessment Appearance:  Appears stated age Attitude/Demeanor/Rapport:    Affect (typically observed):  Accepting Orientation:  Oriented to Self, Oriented to Place, Oriented to  Time, Oriented to Situation Alcohol / Substance use:  Not Applicable Psych involvement (Current and /or in the community):  No (Comment)  Discharge Needs  Concerns to be addressed:  Discharge Planning Concerns Readmission within the last 30 days:  No Current discharge risk:  Dependent with Mobility Barriers to Discharge:  Continued Medical Work up   [employer], LCSW 01/31/2018, 1:46 PM

## 2018-01-31 NOTE — Progress Notes (Signed)
PROGRESS NOTE  [name] [name] WGY:[id number] DOB: 06/17/1945 DOA: 01/25/2018 PCP: Glenda Chroman, MD   LOS: 6 days   Brief Narrative / Interim history: [name] [name] is an 73 y.o. female past medical history of peripheral vascular disease status post left femoral-popliteal bypass, Peripheral Vascular Atherectomy10/26/17,CAD status post CABG, chronic diastolic heart failure, chronic kidney disease stage IV hypothyroidism insulin-dependent diabetes mellitus presents to the emergency room for evaluation of discoloration of her left foot and nonhealing left shin ulcer  Subjective: -Complains of "soreness" to left lower extremity.  No chest pain, no shortness of breath.  No abdominal pain, no nausea or vomiting  Assessment & Plan: Principal Problem:   Critical ischemia of extremity with history of revascularization of same extremity Active Problems:   Carotid artery disease (HCC)   CKD (chronic kidney disease), stage IV (HCC)   Acquired hypothyroidism   Chronic diastolic CHF (congestive heart failure) ([place])   Uncontrolled type 2 diabetes mellitus with hyperglycemia (HCC)   Sacral decubitus ulcer, stage II (HCC)   Principal Problem Critical ischemia of extremity with history of revascularization of same extremity/left toe gangrene -With a history of PAD status post femoropopliteal on the left. -Continue empiric antibiotics.  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 -Antibiotics to be done today at 48 hours per vascular surgery initial recommendations  Active Problems Chronic diastolic heart failure -Appears to be compensated.  Creatinine slightly up but still within her baseline continue to hold diuretics again today,  CAD -Denies any chest pain.  EKG was unrevealing. -Continue statins, aspirin and Plavix have been held for anticipated amputation resume after surgery per vascular -She had an episode of chest pain which  resolved after 15 minutes likely positional cardiac biomarkers have remained flat, EKG unreaveling. -No further chest pain  Acute blood loss anemia -Hemoglobin dropped to 7.8 from 10 couple of days ago.  Given rate of drop, noted wheezing from the surgical site as well as underlying coronary artery disease will transfuse a unit of packed red blood cells  Insulin-dependent diabetes mellitus type 2 -With an A1c of 6.2 back in March 2019. -Fasting CBG is 120, continue current regimen  Hypothyroidism -Continue Synthroid  Chronic kidney disease stage IV -Her baseline creatinine is around 1.5-2.0 -Creatinine 1.2 in the last couple of days, down from her baseline, increased yesterday to 1.5 and overall stable today at 1.6.  Sacral decubitus ulcer, stage II ([place]) -Consulted wound care nurse.  RN Pressure Injury Documentation: Pressure Injury 03/31/17 Stage I -  Intact skin with non-blanchable redness of a localized area usually over a bony prominence. (Active)  03/31/17 1637  Location: Buttocks  Location Orientation: Right  Staging: Stage I -  Intact skin with non-blanchable redness of a localized area usually over a bony prominence.  Wound Description (Comments):   Present on Admission: Yes     Pressure Injury 01/25/18 Stage I -  Intact skin with non-blanchable redness of a localized area usually over a bony prominence. (Active)  01/25/18 2300  Location: Sacrum  Location Orientation:   Staging: Stage I -  Intact skin with non-blanchable redness of a localized area usually over a bony prominence.  Wound Description (Comments):   Present on Admission: Yes     Scheduled Meds: . sodium chloride   Intravenous Once  . atorvastatin  40 mg Oral q1800  . citalopram  20 mg Oral Daily  . docusate sodium  100 mg Oral Daily  .  enoxaparin (LOVENOX) injection  40 mg Subcutaneous Q24H  . feeding supplement (PRO-STAT SUGAR FREE 64)  30 mL Oral BID  . gabapentin  200 mg Oral Q12H  . insulin  aspart  0-5 Units Subcutaneous QHS  . insulin aspart  0-9 Units Subcutaneous TID WC  . insulin aspart  3 Units Subcutaneous TID WC  . isosorbide mononitrate  30 mg Oral Daily  . levothyroxine  75 mcg Oral QAC breakfast  . multivitamin with minerals  1 tablet Oral Daily  . pantoprazole  40 mg Oral Daily  . sodium chloride flush  3 mL Intravenous Q12H  . sodium chloride flush  3 mL Intravenous Q12H   Continuous Infusions: . sodium chloride 50 mL/hr at 01/30/18 1855  . lactated ringers 10 mL/hr at 01/29/18 1002  . magnesium sulfate 1 - 4 g bolus IVPB     PRN Meds:.sodium chloride, acetaminophen **OR** acetaminophen, alum & mag hydroxide-simeth, fentaNYL (SUBLIMAZE) injection, guaiFENesin-dextromethorphan, hydrALAZINE, HYDROmorphone (DILAUDID) injection, labetalol, magnesium sulfate 1 - 4 g bolus IVPB, metoprolol tartrate, nitroGLYCERIN, ondansetron **OR** ondansetron (ZOFRAN) IV, oxyCODONE-acetaminophen, phenol, polyethylene glycol, potassium chloride, sodium chloride flush, tiZANidine  DVT prophylaxis: Lovenox Code Status: DNR Family Communication: No family at bedside this morning Disposition Plan: SNF likely 24 hours  Consultants:   Vascular surgery   Procedures:   AKA left 1/21  Antimicrobials:  Vancomycin   Cefepime    Objective: Vitals:   01/30/18 0437 01/30/18 1329 01/30/18 1942 01/31/18 0337  BP: (!) 125/59 (!) 79/35 102/72 (!) 129/58  Pulse: (!) 59 (!) 49 (!) 51 (!) 56  Resp: 14 (!) _0 Temp: (!) 97.4 F (36.3 C) 98 F (36.7 C) 97.7 F (36.5 C) 98 F (36.7 C)  TempSrc: Oral Oral Oral Oral  SpO2: 92% 100% 98% 96%  Height:        Intake/Output Summary (Last 24 hours) at 01/31/2018 0902 Last data filed at 01/31/2018 1157 Gross per 24 hour  Intake 920.14 ml  Output 1600 ml  Net -679.86 ml   There were no vitals filed for this visit.  Examination:  Constitutional: NAD Eyes: No scleral icterus ENMT: Moist mucous membranes Neck: normal,  supple Respiratory: Clear to auscultation bilaterally without wheezing or crackles heard Cardiovascular: Regular rate and rhythm, no murmurs appreciated.  No peripheral edema Abdomen: Soft, nontender, nondistended, positive bowel sounds Musculoskeletal: no clubbing / cyanosis.  Left AKA wrapped Skin: No rashes appreciated Neurologic: No focal deficits  Data Reviewed: I have independently reviewed following labs and imaging studies   CBC: Recent Labs  Lab 01/25/18 1552 01/26/18 0418 01/28/18 0333 01/29/18 0246 01/30/18 0202 01/31/18 0324  WBC 10.6* 6.6 6.7 7.8 10.2 7.6  NEUTROABS 9.0* 5.3  --   --   --   --   HGB 10.6* 9.3* 9.0* 10.0* 8.6* 7.8*  HCT 36.3 33.0* 30.6* 32.7* 28.7* 26.3*  MCV 85.0 84.8 85.0 81.3 82.2 83.8  PLT 275 182 190 197 183 262   Basic Metabolic Panel: Recent Labs  Lab 01/26/18 0418 01/28/18 0333 01/29/18 0246 01/30/18 0202 01/31/18 0324  NA 137 137 135 132* 134*  K 4.7 4.4 3.7 4.7 3.8  CL 106 105 97* 96* 99  CO2 21* _1 GLUCOSE 144* 119* 146* 272* 139*  BUN 42* 36* 39* 46* 52*  CREATININE 1.59* 1.14* 1.12* 1.50* 1.62*  CALCIUM 8.5* 8.4* 8.8* 8.4* 8.0*   GFR: Estimated Creatinine Clearance: 36.2 mL/min (A) (by C-G formula based on SCr of 1.62  mg/dL (H)). Liver Function Tests: Recent Labs  Lab 01/25/18 1552  AST 27  ALT 19  ALKPHOS 83  BILITOT 0.5  PROT 8.0  ALBUMIN 2.9*   Recent Labs  Lab 01/25/18 1552  LIPASE 31   No results for input(s): AMMONIA in the last 168 hours. Coagulation Profile: No results for input(s): INR, PROTIME in the last 168 hours. Cardiac Enzymes: Recent Labs  Lab 01/25/18 1552 01/25/18 1806 01/28/18 2055 01/29/18 0246  CKTOTAL 51  --   --   --   TROPONINI  --  <0.03 0.04* 0.04*   BNP (last 3 results) No results for input(s): PROBNP in the last 8760 hours. HbA1C: No results for input(s): HGBA1C in the last 72 hours. CBG: Recent Labs  Lab 01/30/18 0539 01/30/18 1105 01/30/18 1638  01/30/18 2127 01/31/18 0612  GLUCAP 182* 152* 159* 115* 127*   Lipid Profile: No results for input(s): CHOL, HDL, LDLCALC, TRIG, CHOLHDL, LDLDIRECT in the last 72 hours. Thyroid Function Tests: No results for input(s): TSH, T4TOTAL, FREET4, T3FREE, THYROIDAB in the last 72 hours. Anemia Panel: No results for input(s): VITAMINB12, FOLATE, FERRITIN, TIBC, IRON, RETICCTPCT in the last 72 hours. Urine analysis:    Component Value Date/Time   COLORURINE YELLOW 01/25/2018 2015   APPEARANCEUR HAZY (A) 01/25/2018 2015   LABSPEC 1.012 01/25/2018 2015   PHURINE 5.0 01/25/2018 2015   GLUCOSEU NEGATIVE 01/25/2018 2015   HGBUR MODERATE (A) 01/25/2018 2015   BILIRUBINUR NEGATIVE 01/25/2018 2015   KETONESUR NEGATIVE 01/25/2018 2015   PROTEINUR NEGATIVE 01/25/2018 2015   UROBILINOGEN 0.2 10/10/2014 2345   NITRITE NEGATIVE 01/25/2018 2015   LEUKOCYTESUR NEGATIVE 01/25/2018 2015   Sepsis Labs: Invalid input(s): PROCALCITONIN, LACTICIDVEN  Recent Results (from the past 240 hour(s))  Blood Culture (routine x 2)     Status: None   Collection Time: 01/25/18  3:40 PM  Result Value Ref Range Status   Specimen Description BLOOD RIGHT WRIST  Final   Special Requests   Final    BOTTLES DRAWN AEROBIC AND ANAEROBIC Blood Culture adequate volume   Culture   Final    NO GROWTH 5 DAYS Performed at [facility], [address] [place], [place] [address]    Report Status 01/30/2018 FINAL  Final  Blood Culture (routine x 2)     Status: None   Collection Time: 01/25/18  3:45 PM  Result Value Ref Range Status   Specimen Description BLOOD LEFT FOREARM  Final   Special Requests   Final    BOTTLES DRAWN AEROBIC AND ANAEROBIC Blood Culture adequate volume   Culture   Final    NO GROWTH 5 DAYS Performed at [facility], [place] [address] [place], [place] [address]    Report Status 01/30/2018 FINAL  Final  MRSA PCR Screening     Status: None   Collection Time: 01/26/18  2:50 AM  Result Value  Ref Range Status   MRSA by PCR NEGATIVE NEGATIVE Final    Comment:        The GeneXpert MRSA Assay (FDA approved for NASAL specimens only), is one component of a comprehensive MRSA colonization surveillance program. It is not intended to diagnose MRSA infection nor to guide or monitor treatment for MRSA infections. Performed at [facility], [place] [address], [place], [place] [address]   Surgical pcr screen     Status: Abnormal   Collection Time: 01/29/18  1:44 AM  Result Value Ref Range Status   MRSA,  PCR NEGATIVE NEGATIVE Final   Staphylococcus aureus POSITIVE (A) NEGATIVE Final    Comment: (NOTE) The Xpert SA Assay (FDA approved for NASAL specimens in patients 68 years of age and older), is one component of a comprehensive surveillance program. It is not intended to diagnose infection nor to guide or monitor treatment.       Radiology Studies: No results found.   Marzetta Board, MD, PhD [facility]  Contact via  [email or web address]  [place] P: [phone]  F: [phone]

## 2018-01-31 NOTE — Progress Notes (Signed)
Inpatient Rehabilitation Admissions Coordinator  Patient appropriate for SNF level rehab at this time. RN CM and SW are aware. We will sign off at this time.  Danne Baxter, RN, MSN Rehab Admissions Coordinator [phone] 01/31/2018 10:37 AM

## 2018-01-31 NOTE — Progress Notes (Signed)
Nutrition Follow-up  DOCUMENTATION CODES:   Obesity unspecified  INTERVENTION:   Add Ensure Enlive po BID, each supplement provides 350 kcal and 20 grams of protein  D/C Pro-stat  Continue MVI with minerals daily  NUTRITION DIAGNOSIS:   Increased nutrient needs related to wound healing as evidenced by estimated needs.  Ongoing   GOAL:   Patient will meet greater than or equal to 90% of their needs  Progressing  MONITOR:   PO intake, Supplement acceptance, Skin, Labs, Weight trends  ASSESSMENT:   73 year old female who presented to the ED on 1/17 from her vein specialist for evaluation of gangrene to L foot and chest pain. PMH significant for T2DM, dementia, left femoral popliteal bypass in September 2019, CKD stage IV, CAD s/p CABG, CHF, HTN.  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 Incision healing well.   Patient reports that she is eating well. Nursing staff has been feeding her meals. She doesn't remember receiving Pro-stat. She consumed 100% of all meals yesterday, 100% of breakfast today, and 25% of lunch today.   Labs and medications reviewed.  Plans for d/c to SNF soon.  Diet Order:   Diet Order            Diet Carb Modified Fluid consistency: Thin; Room service appropriate? Yes  Diet effective now              EDUCATION NEEDS:   No education needs have been identified at this time  Skin:  Skin Assessment: Skin Integrity Issues: Skin Integrity Issues:: Stage I, Other (Comment) Stage I: sacrum, R buttocks Other: L leg AKA 1/21  Last BM:  1/21  Height:   Ht Readings from Last 1 Encounters:  01/29/18 _0  (1.676 m)    Weight:   Wt Readings from Last 1 Encounters:  01/25/18 88.5 kg    Ideal Body Weight:  59.1 kg  BMI:  Body mass index is 31.47 kg/m.  Estimated Nutritional Needs:   Kcal:  1600-1800  Protein:  95-110 grams  Fluid:  1.6-1.8 L    Molli Barrows, RD, LDN, [place] Pager [phone] After Hours Pager [phone]

## 2018-01-31 NOTE — Progress Notes (Signed)
   VASCULAR SURGERY ASSESSMENT & PLAN:   2 Days Post-Op s/p: Left AKA.  Her incision is healing well.  On the lateral aspect there is 1 small area that has a small amount of bloody drainage.  Continue daily dressing changes.  SUBJECTIVE:   Pain well controlled.  PHYSICAL EXAM:   Vitals:   01/30/18 0437 01/30/18 1329 01/30/18 1942 01/31/18 0337  BP: (!) 125/59 (!) 79/35 102/72 (!) 129/58  Pulse: (!) 59 (!) 49 (!) 51 (!) 56  Resp: 14 (!) _0 Temp: (!) 97.4 F (36.3 C) 98 F (36.7 C) 97.7 F (36.5 C) 98 F (36.7 C)  TempSrc: Oral Oral Oral Oral  SpO2: 92% 100% 98% 96%  Height:       Her incision was inspected this morning and looks fine.  There is 1 small area of the lateral aspect that has a small amount of oozing.  LABS:   Lab Results  Component Value Date   WBC 7.6 01/31/2018   HGB 7.8 (L) 01/31/2018   HCT 26.3 (L) 01/31/2018   MCV 83.8 01/31/2018   PLT 164 01/31/2018   CBG (last 3)  Recent Labs    01/30/18 1638 01/30/18 2127 01/31/18 0612  GLUCAP 159* 115* 127*    PROBLEM LIST:    Principal Problem:   Critical ischemia of extremity with history of revascularization of same extremity Active Problems:   Carotid artery disease (HCC)   CKD (chronic kidney disease), stage IV (HCC)   Acquired hypothyroidism   Chronic diastolic CHF (congestive heart failure) ([place])   Uncontrolled type 2 diabetes mellitus with hyperglycemia (HCC)   Sacral decubitus ulcer, stage II (HCC)   CURRENT MEDS:   . atorvastatin  40 mg Oral q1800  . citalopram  20 mg Oral Daily  . docusate sodium  100 mg Oral Daily  . enoxaparin (LOVENOX) injection  40 mg Subcutaneous Q24H  . feeding supplement (PRO-STAT SUGAR FREE 64)  30 mL Oral BID  . gabapentin  200 mg Oral Q12H  . insulin aspart  0-5 Units Subcutaneous QHS  . insulin aspart  0-9 Units Subcutaneous TID WC  . insulin aspart  3 Units Subcutaneous TID WC  . isosorbide mononitrate  30 mg Oral Daily  . levothyroxine  75 mcg  Oral QAC breakfast  . multivitamin with minerals  1 tablet Oral Daily  . pantoprazole  40 mg Oral Daily  . sodium chloride flush  3 mL Intravenous Q12H  . sodium chloride flush  3 mL Intravenous Q12H   Deitra Mayo Beeper: [phone] Office: [phone] 01/31/2018

## 2018-02-01 DIAGNOSIS — I739 Peripheral vascular disease, unspecified: Secondary | ICD-10-CM | POA: Diagnosis not present

## 2018-02-01 DIAGNOSIS — M2550 Pain in unspecified joint: Secondary | ICD-10-CM | POA: Diagnosis not present

## 2018-02-01 DIAGNOSIS — M6281 Muscle weakness (generalized): Secondary | ICD-10-CM | POA: Diagnosis not present

## 2018-02-01 DIAGNOSIS — M79605 Pain in left leg: Secondary | ICD-10-CM | POA: Diagnosis not present

## 2018-02-01 DIAGNOSIS — L89152 Pressure ulcer of sacral region, stage 2: Secondary | ICD-10-CM | POA: Diagnosis not present

## 2018-02-01 DIAGNOSIS — I2510 Atherosclerotic heart disease of native coronary artery without angina pectoris: Secondary | ICD-10-CM | POA: Diagnosis not present

## 2018-02-01 DIAGNOSIS — I10 Essential (primary) hypertension: Secondary | ICD-10-CM | POA: Diagnosis not present

## 2018-02-01 DIAGNOSIS — G629 Polyneuropathy, unspecified: Secondary | ICD-10-CM | POA: Diagnosis not present

## 2018-02-01 DIAGNOSIS — I5032 Chronic diastolic (congestive) heart failure: Secondary | ICD-10-CM | POA: Diagnosis not present

## 2018-02-01 DIAGNOSIS — F5105 Insomnia due to other mental disorder: Secondary | ICD-10-CM | POA: Diagnosis not present

## 2018-02-01 DIAGNOSIS — I69311 Memory deficit following cerebral infarction: Secondary | ICD-10-CM | POA: Diagnosis not present

## 2018-02-01 DIAGNOSIS — M7989 Other specified soft tissue disorders: Secondary | ICD-10-CM | POA: Diagnosis not present

## 2018-02-01 DIAGNOSIS — N184 Chronic kidney disease, stage 4 (severe): Secondary | ICD-10-CM | POA: Diagnosis not present

## 2018-02-01 DIAGNOSIS — G546 Phantom limb syndrome with pain: Secondary | ICD-10-CM | POA: Diagnosis not present

## 2018-02-01 DIAGNOSIS — L03116 Cellulitis of left lower limb: Secondary | ICD-10-CM | POA: Diagnosis not present

## 2018-02-01 DIAGNOSIS — Z4781 Encounter for orthopedic aftercare following surgical amputation: Secondary | ICD-10-CM | POA: Diagnosis not present

## 2018-02-01 DIAGNOSIS — E1165 Type 2 diabetes mellitus with hyperglycemia: Secondary | ICD-10-CM | POA: Diagnosis not present

## 2018-02-01 DIAGNOSIS — Z7401 Bed confinement status: Secondary | ICD-10-CM | POA: Diagnosis not present

## 2018-02-01 DIAGNOSIS — Z89612 Acquired absence of left leg above knee: Secondary | ICD-10-CM | POA: Diagnosis not present

## 2018-02-01 DIAGNOSIS — F339 Major depressive disorder, recurrent, unspecified: Secondary | ICD-10-CM | POA: Diagnosis not present

## 2018-02-01 DIAGNOSIS — E1122 Type 2 diabetes mellitus with diabetic chronic kidney disease: Secondary | ICD-10-CM | POA: Diagnosis not present

## 2018-02-01 DIAGNOSIS — M545 Low back pain: Secondary | ICD-10-CM | POA: Diagnosis not present

## 2018-02-01 DIAGNOSIS — I998 Other disorder of circulatory system: Secondary | ICD-10-CM | POA: Diagnosis not present

## 2018-02-01 DIAGNOSIS — Z23 Encounter for immunization: Secondary | ICD-10-CM | POA: Diagnosis not present

## 2018-02-01 DIAGNOSIS — N183 Chronic kidney disease, stage 3 (moderate): Secondary | ICD-10-CM | POA: Diagnosis not present

## 2018-02-01 DIAGNOSIS — I779 Disorder of arteries and arterioles, unspecified: Secondary | ICD-10-CM | POA: Diagnosis not present

## 2018-02-01 DIAGNOSIS — E1169 Type 2 diabetes mellitus with other specified complication: Secondary | ICD-10-CM | POA: Diagnosis not present

## 2018-02-01 DIAGNOSIS — F331 Major depressive disorder, recurrent, moderate: Secondary | ICD-10-CM | POA: Diagnosis not present

## 2018-02-01 DIAGNOSIS — E039 Hypothyroidism, unspecified: Secondary | ICD-10-CM | POA: Diagnosis not present

## 2018-02-01 DIAGNOSIS — D62 Acute posthemorrhagic anemia: Secondary | ICD-10-CM | POA: Diagnosis not present

## 2018-02-01 DIAGNOSIS — Z959 Presence of cardiac and vascular implant and graft, unspecified: Secondary | ICD-10-CM | POA: Diagnosis not present

## 2018-02-01 DIAGNOSIS — Z741 Need for assistance with personal care: Secondary | ICD-10-CM | POA: Diagnosis not present

## 2018-02-01 DIAGNOSIS — I70262 Atherosclerosis of native arteries of extremities with gangrene, left leg: Secondary | ICD-10-CM | POA: Diagnosis not present

## 2018-02-01 LAB — TYPE AND SCREEN
ABO/RH(D): O POS
Antibody Screen: NEGATIVE
Unit division: 0

## 2018-02-01 LAB — BASIC METABOLIC PANEL
Anion gap: 6 (ref 5–15)
BUN: 47 mg/dL — AB (ref 8–23)
CO2: 27 mmol/L (ref 22–32)
Calcium: 8.3 mg/dL — ABNORMAL LOW (ref 8.9–10.3)
Chloride: 104 mmol/L (ref 98–111)
Creatinine, Ser: 1.31 mg/dL — ABNORMAL HIGH (ref 0.44–1.00)
GFR calc Af Amer: 48 mL/min — ABNORMAL LOW (ref >60)
GFR calc non Af Amer: 41 mL/min — ABNORMAL LOW (ref >60)
Glucose, Bld: 155 mg/dL — ABNORMAL HIGH (ref 70–99)
Potassium: 3.9 mmol/L (ref 3.5–5.1)
Sodium: 137 mmol/L (ref 135–145)

## 2018-02-01 LAB — CBC
HEMATOCRIT: 30 % — AB (ref 36.0–46.0)
Hemoglobin: 9.3 g/dL — ABNORMAL LOW (ref 12.0–15.0)
MCH: 26.3 pg (ref 26.0–34.0)
MCHC: 31 g/dL (ref 30.0–36.0)
MCV: 84.7 fL (ref 80.0–100.0)
Platelets: 147 10*3/uL — ABNORMAL LOW (ref 150–400)
RBC: 3.54 MIL/uL — ABNORMAL LOW (ref 3.87–5.11)
RDW: 19 % — ABNORMAL HIGH (ref 11.5–15.5)
WBC: 7.3 10*3/uL (ref 4.0–10.5)
nRBC: 0 % (ref 0.0–0.2)

## 2018-02-01 LAB — BPAM RBC
Blood Product Expiration Date: 202002232359
ISSUE DATE / TIME: 202001231223
Unit Type and Rh: 5100

## 2018-02-01 LAB — GLUCOSE, CAPILLARY
Glucose-Capillary: 148 mg/dL — ABNORMAL HIGH (ref 70–99)
Glucose-Capillary: 186 mg/dL — ABNORMAL HIGH (ref 70–99)
Glucose-Capillary: 195 mg/dL — ABNORMAL HIGH (ref 70–99)

## 2018-02-01 MED ORDER — OXYCODONE HCL 5 MG PO TABS: 5.0000 mg | ORAL_TABLET | Freq: Four times a day (QID) | ORAL | 0 refills | Status: AC | PRN

## 2018-02-01 MED ORDER — MUPIROCIN 2 % EX OINT
1.0000 "application " | TOPICAL_OINTMENT | Freq: Two times a day (BID) | CUTANEOUS | Status: DC
  Administered 2018-02-01: 1 via NASAL
  Filled 2018-02-01: qty 22

## 2018-02-01 MED ORDER — SENNOSIDES-DOCUSATE SODIUM 8.6-50 MG PO TABS
2.0000 | ORAL_TABLET | Freq: Once | ORAL | Status: AC
  Administered 2018-02-01: 2 via ORAL
  Filled 2018-02-01: qty 2

## 2018-02-01 MED ORDER — CHLORHEXIDINE GLUCONATE CLOTH 2 % EX PADS
6.0000 | MEDICATED_PAD | Freq: Every day | CUTANEOUS | Status: DC
  Administered 2018-02-01: 6 via TOPICAL

## 2018-02-01 MED ORDER — TORSEMIDE 20 MG PO TABS: 40.0000 mg | ORAL_TABLET | Freq: Two times a day (BID) | ORAL | 3 refills | Status: DC

## 2018-02-01 MED ORDER — TRAMADOL HCL 50 MG PO TABS: 50.0000 mg | ORAL_TABLET | Freq: Three times a day (TID) | ORAL | 0 refills | Status: DC

## 2018-02-01 NOTE — Discharge Summary (Signed)
Physician Discharge Summary  [name] [name] WPY:[id number] DOB: January 18, 1945 DOA: 01/25/2018  PCP: Glenda Chroman, MD  Admit date: 01/25/2018 Discharge date: 02/01/2018  Admitted From: home Disposition:  SNF  Recommendations for Outpatient Follow-up:  1. Follow up with Dr. Scot Dock in office in 1 week 2. Please obtain BMP/CBC in one week  Home Health: none  Equipment/Devices: none  Discharge Condition: stable CODE STATUS: DNR Diet recommendation: Heart healthy  HPI: Per admitting MD, [name] [name] is a 73 y.o. female with medical history significant for peripheral arterial disease status post left femoral-popliteal bypass, CAD status post CABG, chronic diastolic CHF, chronic kidney disease stage IV, hypothyroidism, insulin-dependent diabetes mellitus, and chronic back pain, now presenting to the emergency department for evaluation of discoloration of her left foot and a nonhealing wound on the left shin.  Patient is accompanied by her daughter who assists with the history.  Patient slid out of bed in early December 2019, had some abrasions to both shins, and has noted worsening discoloration of the lower left leg since that time, and the wound on the left shin has failed to heal.  Over the past week or so, toe discoloration on the left has worsened significantly, she has had increasing fatigue and malaise, and has persistent bloody drainage from her left shin wound.  She has complained of chest pain intermittently for the past few days, localized to the central chest, with no alleviating or exacerbating factors identified, without diaphoresis or nausea, and with no interventions attempted at home.  She denies any chest pain today.  She has not noted any fevers or chills.  She was seen in the vascular clinic today, had ultrasound performed with no color-flow or Doppler signal in the femoral to popliteal graft on the left, concerning for occlusion.  She was directed to the ED.  Hospital  Course:  Principal Problem Critical ischemia of extremity with history of revascularization of same extremity/left toe gangrene -With a history of PAD status post femoropopliteal on the left.  Vascular surgery was consulted and followed patient while hospitalized.  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  Postop she was maintained on antibiotics for 48 hours per vascular surgery recommendations, she is remained afebrile, no leukocytosis.  Surgical site is healing well, discussed with Dr. Scot Dock, he will follow as an outpatient  Active Problems Chronic diastolic heart failure -Appears to be compensated.  Resume torsemide CAD -Denies any chest pain. EKG was unrevealing.  Continue home regimen on discharge.  Of note, she had an episode of chest pain while hospitalized which resolved after 15 minutes, was felt to be likely positional cardiac biomarkers have remained flat, EKG unreaveling.  She has not had any further chest pains. Acute blood loss anemia -Hemoglobin dropped to 7.8 from 10 couple of days ago.  Given rate of drop, noted oozing from the surgical site as well as underlying coronary artery disease will transfuse a unit of packed red blood cells.  Hemoglobin responded appropriately, wound is healing nicely and she does not have any bleeding. Insulin-dependent diabetes mellitus type 2 -With an A1c of 6.2 back in March 2019.  Continue home regimen Hypothyroidism -Continue Synthroid Chronic kidney disease stage IV -Her baseline creatinine is around 1.5-2.0, and has remained at baseline while hospitalized.  She was resumed on her torsemide 80 mg twice daily followed by a small creatinine bump, this was held for 2 days and her creatinine is stabilized and is 1.3 on discharge.  Resume torsemide at  a lower dose at 40 twice daily, closely monitor fluid status as well as BMP in 3 to 4 days.  May need to go back to her home dose in a few days Sacral decubitus ulcer, stage II ([place])  -Consulted wound care nurse. RN Pressure Injury Documentation: Pressure Injury 03/31/17 Stage I - Intact skin with non-blanchable redness of a localized area usually over a bony prominence. (Active)  03/31/17 1637  Location: Buttocks  Location Orientation: Right  Staging: Stage I - Intact skin with non-blanchable redness of a localized area usually over a bony prominence.  Wound Description (Comments):   Present on Admission: Yes    Pressure Injury 01/25/18 Stage I - Intact skin with non-blanchable redness of a localized area usually over a bony prominence. (Active)  01/25/18 2300  Location: Sacrum  Location Orientation:   Staging: Stage I - Intact skin with non-blanchable redness of a localized area usually over a bony prominence.  Wound Description (Comments):   Present on Admission: Yes     Discharge Diagnoses:  Principal Problem:   Critical ischemia of extremity with history of revascularization of same extremity Active Problems:   Carotid artery disease (HCC)   CKD (chronic kidney disease), stage IV (HCC)   Acquired hypothyroidism   Chronic diastolic CHF (congestive heart failure) ([place])   Uncontrolled type 2 diabetes mellitus with hyperglycemia ([place])   Sacral decubitus ulcer, stage II ([place])     Discharge Instructions   Allergies as of 02/01/2018      Reactions   Other Shortness Of Breath, Rash, Other (See Comments)   CANNOT EAT ANY BERRIES!!   Strawberry Extract Hives, Shortness Of Breath, Rash   Sulfa Antibiotics Swelling, Other (See Comments)   Bodily Swelling   Coffee Bean Extract [coffea Arabica] Itching, Rash   Tape Other (See Comments)   Tears skin.  Please use "paper" tape only.      Medication List    TAKE these medications   acetaminophen 500 MG tablet Commonly known as:  TYLENOL Take 500 mg by mouth every 6 (six) hours as needed for mild pain or moderate pain.   aspirin 81 MG EC tablet Take 81 mg by mouth daily.   atorvastatin 40 MG  tablet Commonly known as:  LIPITOR TAKE 1 TABLET BY MOUTH ONCE DAILY AT  6  PM What changed:  See the new instructions.   citalopram 20 MG tablet Commonly known as:  CELEXA Take 20 mg by mouth daily.   clopidogrel 75 MG tablet Commonly known as:  PLAVIX TAKE 1 TABLET BY MOUTH ONCE DAILY WITH BREAKFAST What changed:    how much to take  how to take this  when to take this  additional instructions   COLACE 100 MG capsule Generic drug:  docusate sodium Take 200 mg by mouth at bedtime as needed for mild constipation.   ferrous sulfate 325 (65 FE) MG tablet Take 325 mg by mouth 2 (two) times daily.   gabapentin 100 MG capsule Commonly known as:  NEURONTIN Take 200 mg by mouth every 12 (twelve) hours.   isosorbide mononitrate 30 MG 24 hr tablet Commonly known as:  IMDUR TAKE 1 TABLET BY MOUTH ONCE DAILY   JANUVIA 25 MG tablet Generic drug:  sitaGLIPtin TAKE 1 TABLET BY MOUTH ONCE DAILY   LEVEMIR 100 UNIT/ML injection Generic drug:  insulin detemir Inject 10 Units into the skin daily with breakfast.   levothyroxine 75 MCG tablet Commonly known as:  SYNTHROID, LEVOTHROID Take 1 tablet (  75 mcg total) by mouth daily before breakfast.   multivitamin with minerals Tabs tablet Take 1 tablet by mouth daily.   nitroGLYCERIN 0.4 MG SL tablet Commonly known as:  NITROSTAT Place 1 tablet (0.4 mg total) under the tongue every 5 (five) minutes as needed for chest pain. Up to 3 doses. If no relief after 3rd dose, proceed to the ED for an evaluation   nystatin cream Commonly known as:  MYCOSTATIN Apply 1 application topically 2 (two) times daily.   oxyCODONE 5 MG immediate release tablet Commonly known as:  Oxy IR/ROXICODONE Take 1 tablet (5 mg total) by mouth 4 (four) times daily as needed for severe pain.   potassium chloride SA 20 MEQ tablet Commonly known as:  K-DUR,KLOR-CON TAKE 1 TABLET BY MOUTH TWICE DAILY   tiZANidine 2 MG tablet Commonly known as:   ZANAFLEX Take 2 mg by mouth every 8 (eight) hours as needed for muscle spasms.   torsemide 20 MG tablet Commonly known as:  DEMADEX Take 2 tablets (40 mg total) by mouth 2 (two) times daily. What changed:  how much to take   traMADol 50 MG tablet Commonly known as:  ULTRAM Take 1 tablet (50 mg total) by mouth 3 (three) times daily.       Consultations:  Vascular surgery   Procedures/Studies:  Ct Abdomen Pelvis Wo Contrast  Result Date: 01/25/2018 CLINICAL DATA:  Abdomen pain for 3 days. EXAM: CT ABDOMEN AND PELVIS WITHOUT CONTRAST TECHNIQUE: Multidetector CT imaging of the abdomen and pelvis was performed following the standard protocol without IV contrast. COMPARISON:  May 16, 2014 FINDINGS: Lower chest: Mild atelectasis of bilateral lung bases are noted. The heart size is enlarged. Hepatobiliary: No focal liver abnormality is seen. Status post cholecystectomy. No biliary dilatation. Pancreas: Unremarkable. No pancreatic ductal dilatation or surrounding inflammatory changes. Spleen: Normal in size without focal abnormality. Adrenals/Urinary Tract: Adrenal glands are unremarkable. Kidneys are normal, without renal calculi, focal lesion, or hydronephrosis. Bladder is unremarkable. Stomach/Bowel: Small hiatal hernia is identified. The stomach is otherwise normal. Prior appendectomy. No evidence of bowel wall thickening, distention, or inflammatory changes. Vascular/Lymphatic: Aortic atherosclerosis. No enlarged abdominal or pelvic lymph nodes. Reproductive: Status post hysterectomy. No adnexal masses. Other: Left inguinal herniation of mesenteric fat is identified. Minimal umbilical hernia of mesenteric fat is noted. Musculoskeletal: Degenerative joint changes of the spine are identified. IMPRESSION: No acute abnormality identified in the abdomen and pelvis. Moderate bowel content is identified throughout colon. Electronically Signed   By: Abelardo Diesel M.D.   On: 01/25/2018 19:42   Dg Chest  Port 1 View  Result Date: 01/25/2018 CLINICAL DATA:  73 year old female with weakness. Chest pain for 1 week. EXAM: PORTABLE CHEST 1 VIEW COMPARISON:  03/31/2017 and earlier. FINDINGS: Portable AP semi upright view at 1553 hours. Stable cardiomegaly and mediastinal contours. Prior CABG. Allowing for portable technique the lungs are clear. No pneumothorax. Visualized tracheal air column is within normal limits. IMPRESSION: No acute cardiopulmonary abnormality. Electronically Signed   By: Genevie Ann M.D.   On: 01/25/2018 16:11   Vas [place] Lower Extremity Arterial Duplex  Result Date: 01/25/2018 LOWER EXTREMITY ARTERIAL DUPLEX STUDY Indications: Ulceration of the left lower extremity calf., gangrene / discharge              of left toes.  Vascular Interventions: Left femoral to popliteal bypass graft 09/2013. Current ABI:            Not obtained Limitations: Patient unable to be properly  positioned. Recent release from              nursing home. Comparison Study: [place] since prior exam of 02/23/2016 Performing Technologist: Alvia Grove RVT  Examination Guidelines: A complete evaluation includes B-mode imaging, spectral Doppler, color Doppler, and power Doppler as needed of all accessible portions of each vessel. Bilateral testing is considered an integral part of a complete examination. Limited examinations for reoccurring indications may be performed as noted.  Left Graft #1: +--------------------+-------------+--------+----------+----------------------+                     PSV cm/s     StenosisWaveform  Comments               +--------------------+-------------+--------+----------+----------------------+ Inflow              137                  biphasic                         +--------------------+-------------+--------+----------+----------------------+ Proximal Anastomosis106                  biphasic                          +--------------------+-------------+--------+----------+----------------------+ Proximal Graft      40 / occludedoccluded          Prestenotic / occluded +--------------------+-------------+--------+----------+----------------------+ Mid Graft                        occluded                                 +--------------------+-------------+--------+----------+----------------------+ Distal Graft                     occluded                                 +--------------------+-------------+--------+----------+----------------------+ Distal Anastamosis               occluded                                 +--------------------+-------------+--------+----------+----------------------+ Outflow             37                   monophasicdampened               +--------------------+-------------+--------+----------+----------------------+  Summary: Left: No color flow or Doppler signal in the femoral to popliteal graft consistent with occlusion.  See table(s) above for measurements and observations. Electronically signed by Servando Snare MD on 01/25/2018 at 3:14:46 PM.    Final       Subjective: -Complains of "soreness" at the left amputation site.  No chest pain, no shortness of breath.  No abdominal pain, no nausea or vomiting  Discharge Exam: Vitals:   01/31/18 2115 02/01/18 0451  BP:  (!) 155/64  Pulse: 67 63  Resp:  14  Temp:  97.7 F (36.5 C)  SpO2:  96%    General: Pt is alert, awake, not in acute distress Cardiovascular: RRR, S1/S2 +, no rubs, no gallops Respiratory: CTA bilaterally, no wheezing, no rhonchi  Abdominal: Soft, NT, ND, bowel sounds + Extremities: no edema, no cyanosis    The results of significant diagnostics from this hospitalization (including imaging, microbiology, ancillary and laboratory) are listed below for reference.     Microbiology: Recent Results (from the past 240 hour(s))  Blood Culture (routine x 2)     Status: None    Collection Time: 01/25/18  3:40 PM  Result Value Ref Range Status   Specimen Description BLOOD RIGHT WRIST  Final   Special Requests   Final    BOTTLES DRAWN AEROBIC AND ANAEROBIC Blood Culture adequate volume   Culture   Final    NO GROWTH 5 DAYS Performed at [facility], [address] [place], [place] [address]    Report Status 01/30/2018 FINAL  Final  Blood Culture (routine x 2)     Status: None   Collection Time: 01/25/18  3:45 PM  Result Value Ref Range Status   Specimen Description BLOOD LEFT FOREARM  Final   Special Requests   Final    BOTTLES DRAWN AEROBIC AND ANAEROBIC Blood Culture adequate volume   Culture   Final    NO GROWTH 5 DAYS Performed at [facility], [place] [address] [place], [place] [address]    Report Status 01/30/2018 FINAL  Final  MRSA PCR Screening     Status: None   Collection Time: 01/26/18  2:50 AM  Result Value Ref Range Status   MRSA by PCR NEGATIVE NEGATIVE Final    Comment:        The GeneXpert MRSA Assay (FDA approved for NASAL specimens only), is one component of a comprehensive MRSA colonization surveillance program. It is not intended to diagnose MRSA infection nor to guide or monitor treatment for MRSA infections. Performed at [facility], [place] [address], [place], [place] [address]   Surgical pcr screen     Status: Abnormal   Collection Time: 01/29/18  1:44 AM  Result Value Ref Range Status   MRSA, PCR NEGATIVE NEGATIVE Final   Staphylococcus aureus POSITIVE (A) NEGATIVE Final    Comment: (NOTE) The Xpert SA Assay (FDA approved for NASAL specimens in patients 50 years of age and older), is one component of a comprehensive surveillance program. It is not intended to diagnose infection nor to guide or monitor treatment.      Labs: BNP (last 3 results) Recent Labs    03/31/17 1333  BNP 761.9*   Basic Metabolic Panel: Recent Labs  Lab 01/28/18 0333 01/29/18 0246 01/30/18 0202 01/31/18 0324  02/01/18 0330  NA 137 135 132* 134* 137  K 4.4 3.7 4.7 3.8 3.9  CL 105 97* 96* 99 104  CO2 _0 GLUCOSE 119* 146* 272* 139* 155*  BUN 36* 39* 46* 52* 47*  CREATININE 1.14* 1.12* 1.50* 1.62* 1.31*  CALCIUM 8.4* 8.8* 8.4* 8.0* 8.3*   Liver Function Tests: Recent Labs  Lab 01/25/18 1552  AST 27  ALT 19  ALKPHOS 83  BILITOT 0.5  PROT 8.0  ALBUMIN 2.9*   Recent Labs  Lab 01/25/18 1552  LIPASE 31   No results for input(s): AMMONIA in the last 168 hours. CBC: Recent Labs  Lab 01/25/18 1552 01/26/18 0418 01/28/18 0333 01/29/18 0246 01/30/18 0202 01/31/18 0324 02/01/18 0330  WBC 10.6* 6.6 6.7 7.8 10.2 7.6 7.3  NEUTROABS 9.0* 5.3  --   --   --   --   --   HGB 10.6* 9.3*  9.0* 10.0* 8.6* 7.8* 9.3*  HCT 36.3 33.0* 30.6* 32.7* 28.7* 26.3* 30.0*  MCV 85.0 84.8 85.0 81.3 82.2 83.8 84.7  PLT 275 182 190 197 183 164 147*   Cardiac Enzymes: Recent Labs  Lab 01/25/18 1552 01/25/18 1806 01/28/18 2055 01/29/18 0246  CKTOTAL 51  --   --   --   TROPONINI  --  <0.03 0.04* 0.04*   BNP: Invalid input(s): POCBNP CBG: Recent Labs  Lab 01/31/18 0612 01/31/18 1128 01/31/18 1628 01/31/18 2108 02/01/18 0621  GLUCAP 127* 159* 145* 128* 148*   D-Dimer No results for input(s): DDIMER in the last 72 hours. Hgb A1c No results for input(s): HGBA1C in the last 72 hours. Lipid Profile No results for input(s): CHOL, HDL, LDLCALC, TRIG, CHOLHDL, LDLDIRECT in the last 72 hours. Thyroid function studies No results for input(s): TSH, T4TOTAL, T3FREE, THYROIDAB in the last 72 hours.  Invalid input(s): FREET3 Anemia work up No results for input(s): VITAMINB12, FOLATE, FERRITIN, TIBC, IRON, RETICCTPCT in the last 72 hours. Urinalysis    Component Value Date/Time   COLORURINE YELLOW 01/25/2018 2015   APPEARANCEUR HAZY (A) 01/25/2018 2015   LABSPEC 1.012 01/25/2018 2015   PHURINE 5.0 01/25/2018 2015   GLUCOSEU NEGATIVE 01/25/2018 2015   HGBUR MODERATE (A) 01/25/2018  2015   BILIRUBINUR NEGATIVE 01/25/2018 2015   KETONESUR NEGATIVE 01/25/2018 2015   PROTEINUR NEGATIVE 01/25/2018 2015   UROBILINOGEN 0.2 10/10/2014 2345   NITRITE NEGATIVE 01/25/2018 2015   LEUKOCYTESUR NEGATIVE 01/25/2018 2015   Sepsis Labs Invalid input(s): PROCALCITONIN,  WBC,  LACTICIDVEN   Time coordinating discharge: 35 minutes  SIGNED:  Marzetta Board, MD  [facility] 02/01/2018, 10:10 AM

## 2018-02-01 NOTE — Progress Notes (Signed)
Patient will DC GE:[facility] Anticipated DC date:02/01/2018 Family notified: Vaughan Basta Transport TC:CEQF  Per MD patient ready for DC to [facility]. RN, patient, patient's family, and facility notified of DC. Discharge Summary sent to facility. RN given number for report [phone]. DC packet on chart. Ambulance transport requested for patient for 3:00pm to ensure BM before transport.  Call CSW if this does not occur before 3:00 pm for CSW to reschedule PTAR thank you.  CSW signing off.  Cherry Monika [address], [place]

## 2018-02-01 NOTE — Progress Notes (Signed)
   VASCULAR SURGERY ASSESSMENT & PLAN:   3 Days Post-Op s/p: Left above-the-knee amputation.  The amputation site is healing nicely.  I will check back Monday.  If there are any problems over the weekend Dr. Donzetta Matters is on call.   SUBJECTIVE:   Pain has been getting better each day.  PHYSICAL EXAM:   Vitals:   01/31/18 1625 01/31/18 2107 01/31/18 2115 02/01/18 0451  BP: (!) 108/54 (!) 143/80  (!) 155/64  Pulse: (!) 49 (!) 56 67 63  Resp:  18  14  Temp: 98.2 F (36.8 C) 98.2 F (36.8 C)  97.7 F (36.5 C)  TempSrc: Oral Oral  Oral  SpO2:  100%  96%  Height:       Right above-the-knee amputation site inspected and looks fine.  LABS:   Lab Results  Component Value Date   WBC 7.3 02/01/2018   HGB 9.3 (L) 02/01/2018   HCT 30.0 (L) 02/01/2018   MCV 84.7 02/01/2018   PLT 147 (L) 02/01/2018   Lab Results  Component Value Date   CREATININE 1.31 (H) 02/01/2018   CBG (last 3)  Recent Labs    01/31/18 1628 01/31/18 2108 02/01/18 0621  GLUCAP 145* 128* 148*    PROBLEM LIST:    Principal Problem:   Critical ischemia of extremity with history of revascularization of same extremity Active Problems:   Carotid artery disease (HCC)   CKD (chronic kidney disease), stage IV (HCC)   Acquired hypothyroidism   Chronic diastolic CHF (congestive heart failure) ([place])   Uncontrolled type 2 diabetes mellitus with hyperglycemia (HCC)   Sacral decubitus ulcer, stage II (HCC)   CURRENT MEDS:   . sodium chloride   Intravenous Once  . atorvastatin  40 mg Oral q1800  . citalopram  20 mg Oral Daily  . docusate sodium  100 mg Oral Daily  . enoxaparin (LOVENOX) injection  40 mg Subcutaneous Q24H  . feeding supplement (ENSURE ENLIVE)  237 mL Oral BID BM  . gabapentin  200 mg Oral Q12H  . insulin aspart  0-5 Units Subcutaneous QHS  . insulin aspart  0-9 Units Subcutaneous TID WC  . insulin aspart  3 Units Subcutaneous TID WC  . isosorbide mononitrate  30 mg Oral Daily  .  levothyroxine  75 mcg Oral QAC breakfast  . multivitamin with minerals  1 tablet Oral Daily  . pantoprazole  40 mg Oral Daily  . sodium chloride flush  3 mL Intravenous Q12H  . sodium chloride flush  3 mL Intravenous Q12H    Deitra Mayo Beeper: [phone] Office: [phone] 02/01/2018

## 2018-02-01 NOTE — Plan of Care (Signed)
  Problem: Pain Managment: Goal: General experience of comfort will improve Outcome: Progressing   Problem: Coping: Goal: Level of anxiety will decrease Outcome: Progressing   Problem: Nutrition: Goal: Adequate nutrition will be maintained Outcome: Progressing   Problem: Education: Goal: Knowledge of General Education information will improve Description Including pain rating scale, medication(s)/side effects and non-pharmacologic comfort measures Outcome: Progressing   Problem: Safety: Goal: Ability to remain free from injury will improve Outcome: Progressing

## 2018-02-01 NOTE — Clinical Social Work Placement (Signed)
   CLINICAL SOCIAL WORK PLACEMENT  NOTE  Date:  02/01/2018  Patient Details  Name: [name] [name] MRN: [id number] Date of Birth: 10/01/1945  Clinical Social Work is seeking post-discharge placement for this patient at the [place] level of care (*CSW will initial, date and re-position this form in  chart as items are completed):      Patient/family provided with [facility] Work Department's list of facilities offering this level of care within the geographic area requested by the patient (or if unable, by the patient's family).  Yes   Patient/family informed of their freedom to choose among providers that offer the needed level of care, that participate in Medicare, Medicaid or managed care program needed by the patient, have an available bed and are willing to accept the patient.      Patient/family informed of [facility]'s ownership interest in [facility] and [facility], as well as of the fact that they are under no obligation to receive care at these facilities.  PASRR submitted to EDS on       PASRR number received on 01/30/18     Existing PASRR number confirmed on       FL2 transmitted to all facilities in geographic area requested by pt/family on 01/30/18     FL2 transmitted to all facilities within larger geographic area on       Patient informed that his/her managed care company has contracts with or will negotiate with certain facilities, including the following:        Yes   Patient/family informed of bed offers received.  Patient chooses bed at [facility]     Physician recommends and patient chooses bed at      Patient to be transferred to [facility] on 02/01/18.  Patient to be transferred to facility by PTAR     Patient family notified on 02/01/18 of transfer.  Name of family member notified:  Nicoletta (daughter)     PHYSICIAN Please sign DNR     Additional Comment:     _______________________________________________ Alberteen Sam, LCSW 02/01/2018, 10:24 AM

## 2018-02-01 NOTE — Progress Notes (Signed)
Physical Therapy Treatment Patient Details Name: [name] [name] MRN: [id number] DOB: 03/30/1945 Today's Date: 02/01/2018    History of Present Illness 73 y.o. female with medical history significant for peripheral arterial disease status post left femoral-popliteal bypass, CAD status post CABG, chronic diastolic CHF, chronic kidney disease stage IV, hypothyroidism, insulin-dependent diabetes mellitus, and chronic back pain.  She presented to the emergency department for evaluation of discoloration of her left foot and a nonhealing wound on the left shin.  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     PT Comments    Pt received in bed agreeable to participation in therapy. She required +1-2 max assist bed mobility. She tolerated sitting EOB x 5 minutes min guard assist, performed dynamic seated activities. Pt required return to supine due to pain. Current POC remains appropriate.    Follow Up Recommendations  SNF     Equipment Recommendations  Other (comment)(TBD at next venue)    Recommendations for Other Services       Precautions / Restrictions Precautions Precautions: Fall Restrictions Weight Bearing Restrictions: Yes LLE Weight Bearing: Non weight bearing    Mobility  Bed Mobility Overal bed mobility: Needs Assistance Bed Mobility: Supine to Sit     Supine to sit: Max assist;HOB elevated Sit to supine: +2 for physical assistance;Max assist   General bed mobility comments: assist to elevate trunk, use of bed pad to scoot to EOB  Transfers                 General transfer comment: unable due to pain  Ambulation/Gait             General Gait Details: unable   Stairs             Wheelchair Mobility    Modified Rankin (Stroke Patients Only)       Balance Overall balance assessment: Needs assistance Sitting-balance support: Feet unsupported;Single extremity supported Sitting balance-Leahy Scale: Fair Sitting balance - Comments: min guard assist to sit  EOB x 5 minutes. Able to perform reaching activities outside BOS without LOB.                                    Cognition Arousal/Alertness: Awake/alert Behavior During Therapy: WFL for tasks assessed/performed Overall Cognitive Status: No family/caregiver present to determine baseline cognitive functioning                                        Exercises General Exercises - Lower Extremity Long Arc Quad: AROM;Right;10 reps;Seated    General Comments        Pertinent Vitals/Pain Pain Assessment: 0-10 Pain Score: 9  Pain Location: LLE Pain Descriptors / Indicators: Grimacing;Guarding;Aching;Sore Pain Intervention(s): Monitored during session;Limited activity within patient's tolerance;Repositioned    Home Living                      Prior Function            PT Goals (current goals can now be found in the care plan section) Acute Rehab PT Goals Patient Stated Goal: decrease pain PT Goal Formulation: With patient Time For Goal Achievement: 02/13/18 Potential to Achieve Goals: Fair Progress towards PT goals: Progressing toward goals    Frequency    Min 2X/week      PT Plan Current plan  remains appropriate    Co-evaluation              AM-PAC PT "6 Clicks" Mobility   Outcome Measure  Help needed turning from your back to your side while in a flat bed without using bedrails?: A Lot Help needed moving from lying on your back to sitting on the side of a flat bed without using bedrails?: A Lot Help needed moving to and from a bed to a chair (including a wheelchair)?: Total Help needed standing up from a chair using your arms (e.g., wheelchair or bedside chair)?: Total Help needed to walk in hospital room?: Total Help needed climbing 3-5 steps with a railing? : Total 6 Click Score: 8    End of Session   Activity Tolerance: Patient limited by pain Patient left: in bed;with call bell/phone within reach;with bed alarm  set Nurse Communication: Mobility status PT Visit Diagnosis: Other abnormalities of gait and mobility (R26.89);Pain Pain - Right/Left: Left Pain - part of body: Leg     Time: 4235-3614 PT Time Calculation (min) (ACUTE ONLY): 14 min  Charges:  $Therapeutic Activity: 8-22 mins                     Lorrin Goodell, PT  Office # [phone] Pager [phone]    Lorriane Shire 02/01/2018, 11:16 AM

## 2018-02-01 NOTE — Care Management Important Message (Signed)
Important Message  Patient Details  Name: [name] [name] MRN: [id number] Date of Birth: Dec 01, 1945   Medicare Important Message Given:  Yes    Hermenia Fritcher P [place] 02/01/2018, 11:24 AM

## 2018-02-01 NOTE — Progress Notes (Signed)
RN called report to [facility] in [place]. All questions answered. All belongings gathered to be with pt. Pt waiting on PTAR. Bandage changed and pt tolerated well.

## 2018-02-15 DIAGNOSIS — I739 Peripheral vascular disease, unspecified: Secondary | ICD-10-CM | POA: Diagnosis not present

## 2018-02-15 DIAGNOSIS — E1169 Type 2 diabetes mellitus with other specified complication: Secondary | ICD-10-CM | POA: Diagnosis not present

## 2018-02-15 DIAGNOSIS — I2510 Atherosclerotic heart disease of native coronary artery without angina pectoris: Secondary | ICD-10-CM | POA: Diagnosis not present

## 2018-02-15 DIAGNOSIS — L03116 Cellulitis of left lower limb: Secondary | ICD-10-CM | POA: Diagnosis not present

## 2018-02-18 DIAGNOSIS — G629 Polyneuropathy, unspecified: Secondary | ICD-10-CM | POA: Diagnosis not present

## 2018-02-18 DIAGNOSIS — G546 Phantom limb syndrome with pain: Secondary | ICD-10-CM | POA: Diagnosis not present

## 2018-02-18 DIAGNOSIS — I739 Peripheral vascular disease, unspecified: Secondary | ICD-10-CM | POA: Diagnosis not present

## 2018-02-18 DIAGNOSIS — E1169 Type 2 diabetes mellitus with other specified complication: Secondary | ICD-10-CM | POA: Diagnosis not present

## 2018-02-20 ENCOUNTER — Ambulatory Visit (INDEPENDENT_AMBULATORY_CARE_PROVIDER_SITE_OTHER): Payer: Medicare Other | Admitting: Vascular Surgery

## 2018-02-20 ENCOUNTER — Encounter: Payer: Self-pay | Admitting: Vascular Surgery

## 2018-02-20 ENCOUNTER — Other Ambulatory Visit: Payer: Self-pay

## 2018-02-20 VITALS — BP 126/56 | HR 52 | Temp 97.2°F | Resp 20 | Ht 66.0 in | Wt 195.0 lb

## 2018-02-20 DIAGNOSIS — I739 Peripheral vascular disease, unspecified: Secondary | ICD-10-CM

## 2018-02-20 DIAGNOSIS — Z48812 Encounter for surgical aftercare following surgery on the circulatory system: Secondary | ICD-10-CM

## 2018-02-20 NOTE — Progress Notes (Signed)
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  HPI:   [name] [name] is a pleasant 73 y.o. female who had presented with a nonsalvageable left lower extremity secondary to multiple wounds.  Left above-the-knee amputation was recommended.  This was performed on 01/29/2018.  She comes in for her first outpatient visit.  She has no specific complaints today.  She is at the skilled nursing facility and has just a little bit of phantom pain in the left foot.  She has no wounds on the right leg.  Current Outpatient Medications  Medication Sig Dispense Refill  . acetaminophen (TYLENOL) 500 MG tablet Take 500 mg by mouth every 6 (six) hours as needed for mild pain or moderate pain.     [name] aspirin 81 MG EC tablet Take 81 mg by mouth daily.    [name] atorvastatin (LIPITOR) 40 MG tablet TAKE 1 TABLET BY MOUTH ONCE DAILY AT  6  PM (Patient taking differently: Take 40 mg by mouth daily. ) 90 tablet 0  . citalopram (CELEXA) 20 MG tablet Take 20 mg by mouth daily.     . clopidogrel (PLAVIX) 75 MG tablet TAKE 1 TABLET BY MOUTH ONCE DAILY WITH BREAKFAST (Patient taking differently: Take 75 mg by mouth daily. ) 90 tablet 0  . docusate sodium (COLACE) 100 MG capsule Take 200 mg by mouth at bedtime as needed for mild constipation.    . ferrous sulfate 325 (65 FE) MG tablet Take 325 mg by mouth 2 (two) times daily.    [name] gabapentin (NEURONTIN) 100 MG capsule Take 200 mg by mouth every 12 (twelve) hours.    . insulin detemir (LEVEMIR) 100 UNIT/ML injection Inject 10 Units into the skin daily with breakfast.    . isosorbide mononitrate (IMDUR) 30 MG 24 hr tablet TAKE 1 TABLET BY MOUTH ONCE DAILY (Patient taking differently: Take 30 mg by mouth daily. ) 90 tablet 1  . JANUVIA 25 MG tablet TAKE 1 TABLET BY MOUTH ONCE DAILY 30 tablet 3  . levothyroxine (SYNTHROID, LEVOTHROID) 75 MCG tablet Take 1 tablet (75 mcg total) by mouth daily  before breakfast.    . Multiple Vitamin (MULTIVITAMIN WITH MINERALS) TABS tablet Take 1 tablet by mouth daily.    . nitroGLYCERIN (NITROSTAT) 0.4 MG SL tablet Place 1 tablet (0.4 mg total) under the tongue every 5 (five) minutes as needed for chest pain. Up to 3 doses. If no relief after 3rd dose, proceed to the ED for an evaluation 25 tablet 3  . nystatin cream (MYCOSTATIN) Apply 1 application topically 2 (two) times daily.    [name] oxyCODONE (OXY IR/ROXICODONE) 5 MG immediate release tablet Take 1 tablet (5 mg total) by mouth 4 (four) times daily as needed for severe pain. 12 tablet 0  . potassium chloride SA (K-DUR,KLOR-CON) 20 MEQ tablet TAKE 1 TABLET BY MOUTH TWICE DAILY (Patient taking differently: Take 20 mEq by mouth 2 (two) times daily. ) 180 tablet 0  . tiZANidine (ZANAFLEX) 2 MG tablet Take 2 mg by mouth every 8 (eight) hours as needed for muscle spasms.     [name] torsemide (DEMADEX) 20 MG tablet Take 2 tablets (40 mg total) by mouth 2 (two) times daily. 720 tablet 3  . traMADol (ULTRAM) 50 MG tablet Take 1 tablet (50 mg total) by mouth 3 (three) times daily. 12 tablet 0   No current facility-administered medications for this visit.  REVIEW OF SYSTEMS:  _0  denotes positive finding, _1  denotes negative finding Vascular    Leg swelling    Cardiac    Chest pain or chest pressure:    Shortness of breath upon exertion:    Short of breath when lying flat:    Irregular heart rhythm:    Constitutional    Fever or chills:     PHYSICAL EXAM:   Vitals:   02/20/18 1520  BP: (!) 126/56  Pulse: (!) 52  Resp: 20  Temp: (!) 97.2 F (36.2 C)  SpO2: 97%  Weight: 195 lb (88.5 kg)  Height: 5' 6" (1.676 m)    GENERAL: The patient is a well-nourished female, in no acute distress. The vital signs are documented above. CARDIOVASCULAR: There is a regular rate and rhythm. PULMONARY: There is good air exchange bilaterally without wheezing or rales. Her right foot appears adequately  perfused. Her left above-the-knee amputation is healed.  DATA:   No new data  MEDICAL ISSUES:   STATUS POST LEFT ABOVE-THE-KNEE AMPUTATION: Her left above-the-knee amputation site is healing nicely.  We removed her staples in the office today and applied Steri-Strips.  I will see her back in 6 months we can do ABIs on the right side and follow her peripheral vascular disease carefully on the right.  She knows to call sooner if she has problems.  Deitra Mayo [facility] [phone]

## 2018-02-26 ENCOUNTER — Encounter: Payer: Self-pay | Admitting: Cardiology

## 2018-02-26 ENCOUNTER — Ambulatory Visit (INDEPENDENT_AMBULATORY_CARE_PROVIDER_SITE_OTHER): Payer: Medicare Other | Admitting: Cardiology

## 2018-02-26 VITALS — BP 142/58 | HR 51 | Ht 66.0 in | Wt 159.0 lb

## 2018-02-26 DIAGNOSIS — N183 Chronic kidney disease, stage 3 unspecified: Secondary | ICD-10-CM

## 2018-02-26 DIAGNOSIS — I5032 Chronic diastolic (congestive) heart failure: Secondary | ICD-10-CM | POA: Diagnosis not present

## 2018-02-26 DIAGNOSIS — I10 Essential (primary) hypertension: Secondary | ICD-10-CM

## 2018-02-26 DIAGNOSIS — I779 Disorder of arteries and arterioles, unspecified: Secondary | ICD-10-CM

## 2018-02-26 DIAGNOSIS — I2510 Atherosclerotic heart disease of native coronary artery without angina pectoris: Secondary | ICD-10-CM | POA: Diagnosis not present

## 2018-02-26 NOTE — Patient Instructions (Addendum)
Medication Instructions:   Your physician recommends that you continue on your current medications as directed. Please refer to the Current Medication list given to you today.  Labwork:  NONE  Testing/Procedures:   NONE  Follow-Up:  Your physician recommends that you schedule a follow-up appointment in: 3 months.  Any Other Special Instructions Will Be Listed Below (If Applicable).  If you need a refill on your cardiac medications before your next appointment, please call your pharmacy.

## 2018-02-26 NOTE — Progress Notes (Signed)
Cardiology Office Note  Date: 02/26/2018   ID: [name] [name], DOB 01-16-1945, MRN [id number]  PCP: Glenda Chroman, MD  Primary Cardiologist: Rozann Lesches, MD   Chief Complaint  Patient presents with  . Diastolic heart failure    History of Present Illness: [name] [name] is a 73 y.o. female last seen in November 2019.  She is here with her daughter today.  Currently residing in the [facility] undergoing rehabilitation. She is following with Dr. Scot Dock, seen recently after left above-the-knee amputation in the setting of severe PAD.  She is in a wheelchair today, seems to be in reasonably good spirits.  No substantial phantom limb pain.  She has had her staples removed.  Undergoing the process of eventually being fitted for a prosthesis.  She does not report any chest pain, has intermittent shortness of breath, continues to use supplemental oxygen.  Her weight is down significantly, although part of this is due to the amputation.  Right leg shows no significant swelling.  Most recent lab work is outlined below from late January.  Past Medical History:  Diagnosis Date  . Arthritis   . Carotid artery disease (HCC)    a. Bilateral CEA; 50-60% bilateral ICA stenosis, 11/12  . Cellulitis    a. Recurrent, bilateral legs  . CHF (congestive heart failure) ([place])   . Chronic back pain   . Chronic diastolic heart failure ([place])   . Coronary atherosclerosis of native coronary artery    a. 11/2010 NSTEMI/CABG x 4: L-LAD; S-PL; S-OM; S-DX, by PVT.  [name] Critical lower limb ischemia   . Depression   . Essential hypertension   . Herniated disc   . History of blood transfusion   . Hypothyroidism   . Ischemic cardiomyopathy    a. 11/2010 TEE: EF 40-45%.  . Migraine   . Mixed hyperlipidemia   . NSTEMI (non-ST elevated myocardial infarction) ([place]) 11/2010  . On home oxygen therapy   . PAD (peripheral artery disease) ([place])   . Pleural effusion   . Pneumonia 2000's X 2  . Stroke ([place])  08/2009  . Suicide attempt ([place]) 08/2002  . TIA (transient ischemic attack)   . TMJ syndrome   . Type 2 diabetes mellitus ([place])   . Venous insufficiency     Past Surgical History:  Procedure Laterality Date  . ABDOMINAL HYSTERECTOMY    . AMPUTATION Left 01/29/2018   Procedure: AMPUTATION ABOVE KNEE;  Surgeon: Angelia Mould, MD;  Location: [place];  Service: Vascular;  Laterality: Left;  . APPENDECTOMY    . CARDIAC CATHETERIZATION  11/2010  . CARDIAC CATHETERIZATION N/A 09/16/2015   Procedure: Right Heart Cath;  Surgeon: Larey Dresser, MD;  Location: [place] CV LAB;  Service: Cardiovascular;  Laterality: N/A;  . CAROTID ENDARTERECTOMY Bilateral 2011   Bilateral - Dr. Kellie Simmering  . CHOLECYSTECTOMY    . COLONOSCOPY W/ BIOPSIES AND POLYPECTOMY    . CORONARY ANGIOPLASTY WITH STENT PLACEMENT  03/2013   "1"  . CORONARY ARTERY BYPASS GRAFT  11/24/2010   Procedure: CORONARY ARTERY BYPASS GRAFTING (CABG);  Surgeon: Tharon Aquas Adelene Idler, MD;  Location: [place];  Service: Open Heart Surgery;  Laterality: N/A;  Coronary Artery Bypass Graft times four on pump utilizing left internal mammary artery and bilateral greater saphenous veins harvested endoscopically, transesophageal echocardiogram   . DEBRIDEMENT TOE Left    "nonhealing wound; 3rd digit  . DILATION AND CURETTAGE OF UTERUS    . ENDARTERECTOMY POPLITEAL  Left 10/07/2013   Procedure: LEFT POPLITEAL ENDARTERECTOMY ;  Surgeon: Angelia Mould, MD;  Location: [place];  Service: Vascular;  Laterality: Left;  . FEMORAL-POPLITEAL BYPASS GRAFT Left 10/07/2013   Procedure: LEFT FEMORAL-POPLITEAL ARTERY BYPASS GRAFT;  Surgeon: Angelia Mould, MD;  Location: [place];  Service: Vascular;  Laterality: Left;  . FRACTURE SURGERY    . INTRAOPERATIVE ARTERIOGRAM Left 10/07/2013   Procedure: INTRA OPERATIVE ARTERIOGRAM LEFT LEG;  Surgeon: Angelia Mould, MD;  Location: [place];  Service: Vascular;  Laterality: Left;  . IR GENERIC HISTORICAL   09/21/2015   IR REMOVAL OF PLURAL CATH W/CUFF 09/21/2015 Marybelle Killings, MD [facility]-INTERV RAD  . LEFT AND RIGHT HEART CATHETERIZATION WITH CORONARY/GRAFT ANGIOGRAM N/A 03/27/2013   Procedure: LEFT AND RIGHT HEART CATHETERIZATION WITH Beatrix Fetters;  Surgeon: Burnell Blanks, MD;  Location: [facility] CATH LAB;  Service: Cardiovascular;  Laterality: N/A;  . LOWER EXTREMITY ANGIOGRAM  09/08/2012; 10/06/2013   "found 100% blockage; unsuccessful attempt at crossing a chronic total occlusion of the left SFA in the setting of critical limb ischemia  . LOWER EXTREMITY ANGIOGRAM Bilateral 09/08/2013   Procedure: LOWER EXTREMITY ANGIOGRAM;  Surgeon: [name], MD;  Location: [facility] CATH LAB;  Service: Cardiovascular;  Laterality: Bilateral;  . PERIPHERAL VASCULAR CATHETERIZATION  11/04/2015   Procedure: Peripheral Vascular Atherectomy;  Surgeon: [name], MD;  Location: [place] CV LAB;  Service: Cardiovascular;;  RT. SFA  . PERIPHERAL VASCULAR CATHETERIZATION Bilateral 11/04/2015   Procedure: Lower Extremity Intervention;  Surgeon: [name], MD;  Location: [place] CV LAB;  Service: Cardiovascular;  Laterality: Bilateral;  . PERIPHERAL VASCULAR CATHETERIZATION  11/04/2015   Procedure: Peripheral Vascular Balloon Angioplasty;  Surgeon: [name], MD;  Location: [place] CV LAB;  Service: Cardiovascular;;  TP Trunk  . TOE SURGERY Left    "put pin in 2nd toe"  . TUBAL LIGATION    . WRIST FRACTURE SURGERY Left    "grafted bone from hip to wrist"    Current Outpatient Medications  Medication Sig Dispense Refill  . acetaminophen (TYLENOL) 500 MG tablet Take 500 mg by mouth every 6 (six) hours as needed for mild pain or moderate pain.     [name] aspirin 81 MG EC tablet Take 81 mg by mouth daily.    [name] atorvastatin (LIPITOR) 40 MG tablet TAKE 1 TABLET BY MOUTH ONCE DAILY AT  6  PM (Patient taking differently: Take 40 mg by mouth daily. ) 90 tablet 0  . citalopram (CELEXA) 20 MG  tablet Take 20 mg by mouth daily.     . clopidogrel (PLAVIX) 75 MG tablet TAKE 1 TABLET BY MOUTH ONCE DAILY WITH BREAKFAST (Patient taking differently: Take 75 mg by mouth daily. ) 90 tablet 0  . docusate sodium (COLACE) 100 MG capsule Take 200 mg by mouth at bedtime as needed for mild constipation.    . ferrous sulfate 325 (65 FE) MG tablet Take 325 mg by mouth 2 (two) times daily.    [name] gabapentin (NEURONTIN) 100 MG capsule Take 200 mg by mouth every 12 (twelve) hours.    . insulin detemir (LEVEMIR) 100 UNIT/ML injection Inject 10 Units into the skin daily with breakfast.    . isosorbide mononitrate (IMDUR) 30 MG 24 hr tablet TAKE 1 TABLET BY MOUTH ONCE DAILY (Patient taking differently: Take 30 mg by mouth daily. ) 90 tablet 1  . JANUVIA 25 MG tablet TAKE 1 TABLET BY MOUTH ONCE DAILY 30 tablet 3  .  levothyroxine (SYNTHROID, LEVOTHROID) 75 MCG tablet Take 1 tablet (75 mcg total) by mouth daily before breakfast.    . Multiple Vitamin (MULTIVITAMIN WITH MINERALS) TABS tablet Take 1 tablet by mouth daily.    . nitroGLYCERIN (NITROSTAT) 0.4 MG SL tablet Place 1 tablet (0.4 mg total) under the tongue every 5 (five) minutes as needed for chest pain. Up to 3 doses. If no relief after 3rd dose, proceed to the ED for an evaluation 25 tablet 3  . nystatin cream (MYCOSTATIN) Apply 1 application topically 2 (two) times daily.    [name] oxyCODONE (OXY IR/ROXICODONE) 5 MG immediate release tablet Take 1 tablet (5 mg total) by mouth 4 (four) times daily as needed for severe pain. 12 tablet 0  . potassium chloride SA (K-DUR,KLOR-CON) 20 MEQ tablet TAKE 1 TABLET BY MOUTH TWICE DAILY (Patient taking differently: Take 20 mEq by mouth 2 (two) times daily. ) 180 tablet 0  . tiZANidine (ZANAFLEX) 2 MG tablet Take 2 mg by mouth every 8 (eight) hours as needed for muscle spasms.     [name] torsemide (DEMADEX) 20 MG tablet Take 2 tablets (40 mg total) by mouth 2 (two) times daily. 720 tablet 3  . traMADol (ULTRAM) 50 MG tablet Take 1  tablet (50 mg total) by mouth 3 (three) times daily. 12 tablet 0   No current facility-administered medications for this visit.    Allergies:  Other; Strawberry extract; Sulfa antibiotics; Coffee bean extract [coffea arabica]; and Tape   Social History: The patient  reports that she quit smoking about 8 years ago. Her smoking use included cigarettes. She started smoking about 62 years ago. She has a 150.00 pack-year smoking history. She has never used smokeless tobacco. She reports current alcohol use. She reports current drug use. Drug: Marijuana.   ROS:  Please see the history of present illness. Otherwise, complete review of systems is positive for chronic shortness of breath.  All other systems are reviewed and negative.   Physical Exam: VS:  BP (!) 142/58   Pulse (!) 51   Ht _0  (1.676 m)   Wt 159 lb (72.1 kg)   SpO2 98%   BMI 25.66 kg/m , BMI Body mass index is 25.66 kg/m.  Wt Readings from Last 3 Encounters:  02/26/18 159 lb (72.1 kg)  02/20/18 195 lb (88.5 kg)  01/25/18 195 lb (88.5 kg)    General: Chronically ill-appearing woman in wheelchair.  Wearing oxygen via nasal cannula. HEENT: Conjunctiva and lids normal, oropharynx clear. Neck: Supple, no elevated JVP, bilateral CEA scars, no thyromegaly. Lungs: Decreased breath sounds without wheezing, nonlabored breathing at rest. Cardiac: Regular rate and rhythm with ectopy, no S3, 2/6 systolic murmur. Abdomen: Soft, nontender, bowel sounds present. Extremities: Status post left AKA.  Right leg without pitting edema. Skin: Warm and dry. Musculoskeletal: No kyphosis. Neuropsychiatric: Alert and oriented x3, affect grossly appropriate.  ECG: I personally reviewed the tracing from 01/28/2018 which showed sinus bradycardia with PVC and nonspecific ST-T changes.  Recent Labwork: 03/31/2017: B Natriuretic Peptide 475.0; TSH 3.573 01/25/2018: ALT 19; AST 27 02/01/2018: BUN 47; Creatinine, Ser 1.31; Hemoglobin 9.3; Platelets 147;  Potassium 3.9; Sodium 137     Component Value Date/Time   CHOL 122 11/21/2016 1510   TRIG 113 11/21/2016 1510   HDL 41 11/21/2016 1510   CHOLHDL 3.0 11/21/2016 1510   VLDL 23 11/21/2016 1510   LDLCALC 58 11/21/2016 1510    Other Studies Reviewed Today:  Echocardiogram 08/21/2017: Study Conclusions  -  Left ventricle: The cavity size was normal. Wall thickness was   increased in a pattern of moderate to severe LVH. Systolic   function was normal. The estimated ejection fraction was in the   range of 60% to 65%. Wall motion was normal; there were no   regional wall motion abnormalities. Doppler parameters are   consistent with restrictive physiology, indicative of decreased   left ventricular diastolic compliance and/or increased left   atrial pressure. Doppler parameters are consistent with high   ventricular filling pressure. - Aortic valve: Valve area (VTI): 1.95 cm^2. Valve area (Vmax):   1.73 cm^2. Valve area (Vmean): 1.83 cm^2. - Mitral valve: Mildly calcified annulus. Mildly thickened leaflets. - Left atrium: The atrium was severely dilated. - Right ventricle: The ventricular septum is flattend in diastole   suggesting RV volume overload. The cavity size was moderately   dilated. Systolic function was mildly reduced. - Right atrium: The atrium was mildly dilated. - Tricuspid valve: There was moderate regurgitation. The TR is   eccentric and may be underestimated - Pulmonary arteries: Systolic pressure was moderately increased.   PA peak pressure: 55 mm Hg (S). - Technically adequate study.  Assessment and Plan:  1.  Chronic diastolic heart failure and right ventricular dysfunction with pulmonary hypertension.  Most recent echocardiogram as detailed above.  Fluid status looks to be adequate today on Demadex 40 mg twice daily and potassium supplements.  I reviewed her most recent lab work as detailed above.  2.  CKD stage III, most recent creatinine 1.31.  3.   Multivessel CAD status post CABG and subsequent graft intervention.  She reports no active angina symptoms.  Continue aspirin, Plavix, and statin.  4.  Essential hypertension, no changes made to current regimen.  Current medicines were reviewed with the patient today.  Disposition: Follow-up in 3 months.  Signed, Satira Sark, MD, [facility] 02/26/2018 3:16 PM    [facility] [place], [place], [place] [address] Phone: [phone]; Fax: [phone]

## 2018-02-27 DIAGNOSIS — G546 Phantom limb syndrome with pain: Secondary | ICD-10-CM | POA: Diagnosis not present

## 2018-02-27 DIAGNOSIS — E1169 Type 2 diabetes mellitus with other specified complication: Secondary | ICD-10-CM | POA: Diagnosis not present

## 2018-02-27 DIAGNOSIS — G629 Polyneuropathy, unspecified: Secondary | ICD-10-CM | POA: Diagnosis not present

## 2018-02-27 DIAGNOSIS — I739 Peripheral vascular disease, unspecified: Secondary | ICD-10-CM | POA: Diagnosis not present

## 2018-03-04 DIAGNOSIS — G546 Phantom limb syndrome with pain: Secondary | ICD-10-CM | POA: Diagnosis not present

## 2018-03-04 DIAGNOSIS — E1169 Type 2 diabetes mellitus with other specified complication: Secondary | ICD-10-CM | POA: Diagnosis not present

## 2018-03-04 DIAGNOSIS — G629 Polyneuropathy, unspecified: Secondary | ICD-10-CM | POA: Diagnosis not present

## 2018-03-04 DIAGNOSIS — I739 Peripheral vascular disease, unspecified: Secondary | ICD-10-CM | POA: Diagnosis not present

## 2018-03-06 DIAGNOSIS — G546 Phantom limb syndrome with pain: Secondary | ICD-10-CM | POA: Diagnosis not present

## 2018-03-06 DIAGNOSIS — I69311 Memory deficit following cerebral infarction: Secondary | ICD-10-CM | POA: Diagnosis not present

## 2018-03-06 DIAGNOSIS — F331 Major depressive disorder, recurrent, moderate: Secondary | ICD-10-CM | POA: Diagnosis not present

## 2018-03-06 DIAGNOSIS — G629 Polyneuropathy, unspecified: Secondary | ICD-10-CM | POA: Diagnosis not present

## 2018-03-06 DIAGNOSIS — I739 Peripheral vascular disease, unspecified: Secondary | ICD-10-CM | POA: Diagnosis not present

## 2018-03-06 DIAGNOSIS — E1169 Type 2 diabetes mellitus with other specified complication: Secondary | ICD-10-CM | POA: Diagnosis not present

## 2018-03-06 DIAGNOSIS — F5105 Insomnia due to other mental disorder: Secondary | ICD-10-CM | POA: Diagnosis not present

## 2018-03-11 DIAGNOSIS — G629 Polyneuropathy, unspecified: Secondary | ICD-10-CM | POA: Diagnosis not present

## 2018-03-11 DIAGNOSIS — E1169 Type 2 diabetes mellitus with other specified complication: Secondary | ICD-10-CM | POA: Diagnosis not present

## 2018-03-11 DIAGNOSIS — G546 Phantom limb syndrome with pain: Secondary | ICD-10-CM | POA: Diagnosis not present

## 2018-03-20 DIAGNOSIS — I10 Essential (primary) hypertension: Secondary | ICD-10-CM | POA: Diagnosis not present

## 2018-03-20 DIAGNOSIS — E1169 Type 2 diabetes mellitus with other specified complication: Secondary | ICD-10-CM | POA: Diagnosis not present

## 2018-03-20 DIAGNOSIS — G546 Phantom limb syndrome with pain: Secondary | ICD-10-CM | POA: Diagnosis not present

## 2018-03-20 DIAGNOSIS — F331 Major depressive disorder, recurrent, moderate: Secondary | ICD-10-CM | POA: Diagnosis not present

## 2018-03-20 DIAGNOSIS — F5105 Insomnia due to other mental disorder: Secondary | ICD-10-CM | POA: Diagnosis not present

## 2018-03-20 DIAGNOSIS — G629 Polyneuropathy, unspecified: Secondary | ICD-10-CM | POA: Diagnosis not present

## 2018-03-20 DIAGNOSIS — I69311 Memory deficit following cerebral infarction: Secondary | ICD-10-CM | POA: Diagnosis not present

## 2018-03-25 DIAGNOSIS — Z4781 Encounter for orthopedic aftercare following surgical amputation: Secondary | ICD-10-CM | POA: Diagnosis not present

## 2018-03-25 DIAGNOSIS — Z741 Need for assistance with personal care: Secondary | ICD-10-CM | POA: Diagnosis not present

## 2018-03-25 DIAGNOSIS — M6281 Muscle weakness (generalized): Secondary | ICD-10-CM | POA: Diagnosis not present

## 2018-04-01 DIAGNOSIS — F5105 Insomnia due to other mental disorder: Secondary | ICD-10-CM | POA: Diagnosis not present

## 2018-04-01 DIAGNOSIS — I69311 Memory deficit following cerebral infarction: Secondary | ICD-10-CM | POA: Diagnosis not present

## 2018-04-01 DIAGNOSIS — F331 Major depressive disorder, recurrent, moderate: Secondary | ICD-10-CM | POA: Diagnosis not present

## 2018-04-02 DIAGNOSIS — Z4781 Encounter for orthopedic aftercare following surgical amputation: Secondary | ICD-10-CM | POA: Diagnosis not present

## 2018-04-02 DIAGNOSIS — Z741 Need for assistance with personal care: Secondary | ICD-10-CM | POA: Diagnosis not present

## 2018-04-02 DIAGNOSIS — M6281 Muscle weakness (generalized): Secondary | ICD-10-CM | POA: Diagnosis not present

## 2018-04-03 DIAGNOSIS — Z4781 Encounter for orthopedic aftercare following surgical amputation: Secondary | ICD-10-CM | POA: Diagnosis not present

## 2018-04-03 DIAGNOSIS — Z741 Need for assistance with personal care: Secondary | ICD-10-CM | POA: Diagnosis not present

## 2018-04-03 DIAGNOSIS — M6281 Muscle weakness (generalized): Secondary | ICD-10-CM | POA: Diagnosis not present

## 2018-04-04 DIAGNOSIS — M6281 Muscle weakness (generalized): Secondary | ICD-10-CM | POA: Diagnosis not present

## 2018-04-04 DIAGNOSIS — Z741 Need for assistance with personal care: Secondary | ICD-10-CM | POA: Diagnosis not present

## 2018-04-04 DIAGNOSIS — Z4781 Encounter for orthopedic aftercare following surgical amputation: Secondary | ICD-10-CM | POA: Diagnosis not present

## 2018-04-05 DIAGNOSIS — Z4781 Encounter for orthopedic aftercare following surgical amputation: Secondary | ICD-10-CM | POA: Diagnosis not present

## 2018-04-05 DIAGNOSIS — M6281 Muscle weakness (generalized): Secondary | ICD-10-CM | POA: Diagnosis not present

## 2018-04-05 DIAGNOSIS — Z741 Need for assistance with personal care: Secondary | ICD-10-CM | POA: Diagnosis not present

## 2018-04-08 DIAGNOSIS — I10 Essential (primary) hypertension: Secondary | ICD-10-CM | POA: Diagnosis not present

## 2018-04-08 DIAGNOSIS — R0989 Other specified symptoms and signs involving the circulatory and respiratory systems: Secondary | ICD-10-CM | POA: Diagnosis not present

## 2018-04-08 DIAGNOSIS — R079 Chest pain, unspecified: Secondary | ICD-10-CM | POA: Diagnosis not present

## 2018-04-08 DIAGNOSIS — Z741 Need for assistance with personal care: Secondary | ICD-10-CM | POA: Diagnosis not present

## 2018-04-08 DIAGNOSIS — Z4781 Encounter for orthopedic aftercare following surgical amputation: Secondary | ICD-10-CM | POA: Diagnosis not present

## 2018-04-08 DIAGNOSIS — R9431 Abnormal electrocardiogram [ECG] [EKG]: Secondary | ICD-10-CM | POA: Diagnosis not present

## 2018-04-08 DIAGNOSIS — M6281 Muscle weakness (generalized): Secondary | ICD-10-CM | POA: Diagnosis not present

## 2018-04-08 DIAGNOSIS — R0781 Pleurodynia: Secondary | ICD-10-CM | POA: Diagnosis not present

## 2018-04-08 DIAGNOSIS — R0789 Other chest pain: Secondary | ICD-10-CM | POA: Diagnosis not present

## 2018-04-08 DIAGNOSIS — G546 Phantom limb syndrome with pain: Secondary | ICD-10-CM | POA: Diagnosis not present

## 2018-04-09 DIAGNOSIS — I69311 Memory deficit following cerebral infarction: Secondary | ICD-10-CM | POA: Diagnosis not present

## 2018-04-09 DIAGNOSIS — F331 Major depressive disorder, recurrent, moderate: Secondary | ICD-10-CM | POA: Diagnosis not present

## 2018-04-09 DIAGNOSIS — Z4781 Encounter for orthopedic aftercare following surgical amputation: Secondary | ICD-10-CM | POA: Diagnosis not present

## 2018-04-09 DIAGNOSIS — M6281 Muscle weakness (generalized): Secondary | ICD-10-CM | POA: Diagnosis not present

## 2018-04-09 DIAGNOSIS — Z741 Need for assistance with personal care: Secondary | ICD-10-CM | POA: Diagnosis not present

## 2018-04-09 DIAGNOSIS — R0789 Other chest pain: Secondary | ICD-10-CM | POA: Diagnosis not present

## 2018-04-10 DIAGNOSIS — Z4781 Encounter for orthopedic aftercare following surgical amputation: Secondary | ICD-10-CM | POA: Diagnosis not present

## 2018-04-10 DIAGNOSIS — I10 Essential (primary) hypertension: Secondary | ICD-10-CM | POA: Diagnosis not present

## 2018-04-10 DIAGNOSIS — M6281 Muscle weakness (generalized): Secondary | ICD-10-CM | POA: Diagnosis not present

## 2018-04-10 DIAGNOSIS — I2510 Atherosclerotic heart disease of native coronary artery without angina pectoris: Secondary | ICD-10-CM | POA: Diagnosis not present

## 2018-04-10 DIAGNOSIS — N189 Chronic kidney disease, unspecified: Secondary | ICD-10-CM | POA: Diagnosis not present

## 2018-04-10 DIAGNOSIS — D508 Other iron deficiency anemias: Secondary | ICD-10-CM | POA: Diagnosis not present

## 2018-04-10 DIAGNOSIS — Z741 Need for assistance with personal care: Secondary | ICD-10-CM | POA: Diagnosis not present

## 2018-04-11 DIAGNOSIS — M6281 Muscle weakness (generalized): Secondary | ICD-10-CM | POA: Diagnosis not present

## 2018-04-11 DIAGNOSIS — Z741 Need for assistance with personal care: Secondary | ICD-10-CM | POA: Diagnosis not present

## 2018-04-11 DIAGNOSIS — Z4781 Encounter for orthopedic aftercare following surgical amputation: Secondary | ICD-10-CM | POA: Diagnosis not present

## 2018-04-12 DIAGNOSIS — Z741 Need for assistance with personal care: Secondary | ICD-10-CM | POA: Diagnosis not present

## 2018-04-12 DIAGNOSIS — M6281 Muscle weakness (generalized): Secondary | ICD-10-CM | POA: Diagnosis not present

## 2018-04-12 DIAGNOSIS — I10 Essential (primary) hypertension: Secondary | ICD-10-CM | POA: Diagnosis not present

## 2018-04-12 DIAGNOSIS — Z4781 Encounter for orthopedic aftercare following surgical amputation: Secondary | ICD-10-CM | POA: Diagnosis not present

## 2018-04-15 DIAGNOSIS — M6281 Muscle weakness (generalized): Secondary | ICD-10-CM | POA: Diagnosis not present

## 2018-04-15 DIAGNOSIS — Z741 Need for assistance with personal care: Secondary | ICD-10-CM | POA: Diagnosis not present

## 2018-04-15 DIAGNOSIS — Z4781 Encounter for orthopedic aftercare following surgical amputation: Secondary | ICD-10-CM | POA: Diagnosis not present

## 2018-04-16 DIAGNOSIS — M6281 Muscle weakness (generalized): Secondary | ICD-10-CM | POA: Diagnosis not present

## 2018-04-16 DIAGNOSIS — Z741 Need for assistance with personal care: Secondary | ICD-10-CM | POA: Diagnosis not present

## 2018-04-16 DIAGNOSIS — Z4781 Encounter for orthopedic aftercare following surgical amputation: Secondary | ICD-10-CM | POA: Diagnosis not present

## 2018-04-17 DIAGNOSIS — M6281 Muscle weakness (generalized): Secondary | ICD-10-CM | POA: Diagnosis not present

## 2018-04-17 DIAGNOSIS — Z4781 Encounter for orthopedic aftercare following surgical amputation: Secondary | ICD-10-CM | POA: Diagnosis not present

## 2018-04-17 DIAGNOSIS — Z741 Need for assistance with personal care: Secondary | ICD-10-CM | POA: Diagnosis not present

## 2018-04-18 DIAGNOSIS — F331 Major depressive disorder, recurrent, moderate: Secondary | ICD-10-CM | POA: Diagnosis not present

## 2018-04-18 DIAGNOSIS — M940 Chondrocostal junction syndrome [Tietze]: Secondary | ICD-10-CM | POA: Diagnosis not present

## 2018-04-18 DIAGNOSIS — M6281 Muscle weakness (generalized): Secondary | ICD-10-CM | POA: Diagnosis not present

## 2018-04-18 DIAGNOSIS — Z741 Need for assistance with personal care: Secondary | ICD-10-CM | POA: Diagnosis not present

## 2018-04-18 DIAGNOSIS — Z4781 Encounter for orthopedic aftercare following surgical amputation: Secondary | ICD-10-CM | POA: Diagnosis not present

## 2018-04-19 DIAGNOSIS — Z4781 Encounter for orthopedic aftercare following surgical amputation: Secondary | ICD-10-CM | POA: Diagnosis not present

## 2018-04-19 DIAGNOSIS — M6281 Muscle weakness (generalized): Secondary | ICD-10-CM | POA: Diagnosis not present

## 2018-04-19 DIAGNOSIS — Z741 Need for assistance with personal care: Secondary | ICD-10-CM | POA: Diagnosis not present

## 2018-04-22 DIAGNOSIS — Z741 Need for assistance with personal care: Secondary | ICD-10-CM | POA: Diagnosis not present

## 2018-04-22 DIAGNOSIS — Z4781 Encounter for orthopedic aftercare following surgical amputation: Secondary | ICD-10-CM | POA: Diagnosis not present

## 2018-04-22 DIAGNOSIS — M6281 Muscle weakness (generalized): Secondary | ICD-10-CM | POA: Diagnosis not present

## 2018-04-23 DIAGNOSIS — Z741 Need for assistance with personal care: Secondary | ICD-10-CM | POA: Diagnosis not present

## 2018-04-23 DIAGNOSIS — M6281 Muscle weakness (generalized): Secondary | ICD-10-CM | POA: Diagnosis not present

## 2018-04-23 DIAGNOSIS — Z4781 Encounter for orthopedic aftercare following surgical amputation: Secondary | ICD-10-CM | POA: Diagnosis not present

## 2018-04-24 DIAGNOSIS — Z4781 Encounter for orthopedic aftercare following surgical amputation: Secondary | ICD-10-CM | POA: Diagnosis not present

## 2018-04-24 DIAGNOSIS — Z741 Need for assistance with personal care: Secondary | ICD-10-CM | POA: Diagnosis not present

## 2018-04-24 DIAGNOSIS — M6281 Muscle weakness (generalized): Secondary | ICD-10-CM | POA: Diagnosis not present

## 2018-04-25 DIAGNOSIS — M6281 Muscle weakness (generalized): Secondary | ICD-10-CM | POA: Diagnosis not present

## 2018-04-25 DIAGNOSIS — Z4781 Encounter for orthopedic aftercare following surgical amputation: Secondary | ICD-10-CM | POA: Diagnosis not present

## 2018-04-25 DIAGNOSIS — Z741 Need for assistance with personal care: Secondary | ICD-10-CM | POA: Diagnosis not present

## 2018-04-26 DIAGNOSIS — M6281 Muscle weakness (generalized): Secondary | ICD-10-CM | POA: Diagnosis not present

## 2018-04-26 DIAGNOSIS — Z741 Need for assistance with personal care: Secondary | ICD-10-CM | POA: Diagnosis not present

## 2018-04-26 DIAGNOSIS — Z4781 Encounter for orthopedic aftercare following surgical amputation: Secondary | ICD-10-CM | POA: Diagnosis not present

## 2018-04-29 DIAGNOSIS — R040 Epistaxis: Secondary | ICD-10-CM | POA: Diagnosis not present

## 2018-04-29 DIAGNOSIS — H6120 Impacted cerumen, unspecified ear: Secondary | ICD-10-CM | POA: Diagnosis not present

## 2018-04-29 DIAGNOSIS — F331 Major depressive disorder, recurrent, moderate: Secondary | ICD-10-CM | POA: Diagnosis not present

## 2018-04-29 DIAGNOSIS — M6281 Muscle weakness (generalized): Secondary | ICD-10-CM | POA: Diagnosis not present

## 2018-04-29 DIAGNOSIS — Z4781 Encounter for orthopedic aftercare following surgical amputation: Secondary | ICD-10-CM | POA: Diagnosis not present

## 2018-04-29 DIAGNOSIS — I69311 Memory deficit following cerebral infarction: Secondary | ICD-10-CM | POA: Diagnosis not present

## 2018-04-29 DIAGNOSIS — E785 Hyperlipidemia, unspecified: Secondary | ICD-10-CM | POA: Diagnosis not present

## 2018-04-29 DIAGNOSIS — Z741 Need for assistance with personal care: Secondary | ICD-10-CM | POA: Diagnosis not present

## 2018-04-29 DIAGNOSIS — G629 Polyneuropathy, unspecified: Secondary | ICD-10-CM | POA: Diagnosis not present

## 2018-04-29 DIAGNOSIS — F5105 Insomnia due to other mental disorder: Secondary | ICD-10-CM | POA: Diagnosis not present

## 2018-04-30 DIAGNOSIS — M6281 Muscle weakness (generalized): Secondary | ICD-10-CM | POA: Diagnosis not present

## 2018-04-30 DIAGNOSIS — Z4781 Encounter for orthopedic aftercare following surgical amputation: Secondary | ICD-10-CM | POA: Diagnosis not present

## 2018-04-30 DIAGNOSIS — Z741 Need for assistance with personal care: Secondary | ICD-10-CM | POA: Diagnosis not present

## 2018-05-01 DIAGNOSIS — I10 Essential (primary) hypertension: Secondary | ICD-10-CM | POA: Diagnosis not present

## 2018-05-01 DIAGNOSIS — R1013 Epigastric pain: Secondary | ICD-10-CM | POA: Diagnosis not present

## 2018-05-01 DIAGNOSIS — I2510 Atherosclerotic heart disease of native coronary artery without angina pectoris: Secondary | ICD-10-CM | POA: Diagnosis not present

## 2018-05-01 DIAGNOSIS — M6281 Muscle weakness (generalized): Secondary | ICD-10-CM | POA: Diagnosis not present

## 2018-05-01 DIAGNOSIS — Z741 Need for assistance with personal care: Secondary | ICD-10-CM | POA: Diagnosis not present

## 2018-05-01 DIAGNOSIS — E039 Hypothyroidism, unspecified: Secondary | ICD-10-CM | POA: Diagnosis not present

## 2018-05-01 DIAGNOSIS — Z4781 Encounter for orthopedic aftercare following surgical amputation: Secondary | ICD-10-CM | POA: Diagnosis not present

## 2018-05-02 DIAGNOSIS — I10 Essential (primary) hypertension: Secondary | ICD-10-CM | POA: Diagnosis not present

## 2018-05-02 DIAGNOSIS — Z741 Need for assistance with personal care: Secondary | ICD-10-CM | POA: Diagnosis not present

## 2018-05-02 DIAGNOSIS — M6281 Muscle weakness (generalized): Secondary | ICD-10-CM | POA: Diagnosis not present

## 2018-05-02 DIAGNOSIS — Z4781 Encounter for orthopedic aftercare following surgical amputation: Secondary | ICD-10-CM | POA: Diagnosis not present

## 2018-05-03 DIAGNOSIS — M6281 Muscle weakness (generalized): Secondary | ICD-10-CM | POA: Diagnosis not present

## 2018-05-03 DIAGNOSIS — Z741 Need for assistance with personal care: Secondary | ICD-10-CM | POA: Diagnosis not present

## 2018-05-03 DIAGNOSIS — Z4781 Encounter for orthopedic aftercare following surgical amputation: Secondary | ICD-10-CM | POA: Diagnosis not present

## 2018-05-06 DIAGNOSIS — Z4781 Encounter for orthopedic aftercare following surgical amputation: Secondary | ICD-10-CM | POA: Diagnosis not present

## 2018-05-06 DIAGNOSIS — R55 Syncope and collapse: Secondary | ICD-10-CM | POA: Diagnosis not present

## 2018-05-06 DIAGNOSIS — Z741 Need for assistance with personal care: Secondary | ICD-10-CM | POA: Diagnosis not present

## 2018-05-06 DIAGNOSIS — M940 Chondrocostal junction syndrome [Tietze]: Secondary | ICD-10-CM | POA: Diagnosis not present

## 2018-05-06 DIAGNOSIS — K5900 Constipation, unspecified: Secondary | ICD-10-CM | POA: Diagnosis not present

## 2018-05-06 DIAGNOSIS — M6281 Muscle weakness (generalized): Secondary | ICD-10-CM | POA: Diagnosis not present

## 2018-05-07 DIAGNOSIS — M6281 Muscle weakness (generalized): Secondary | ICD-10-CM | POA: Diagnosis not present

## 2018-05-07 DIAGNOSIS — D649 Anemia, unspecified: Secondary | ICD-10-CM | POA: Diagnosis not present

## 2018-05-07 DIAGNOSIS — I10 Essential (primary) hypertension: Secondary | ICD-10-CM | POA: Diagnosis not present

## 2018-05-07 DIAGNOSIS — Z4781 Encounter for orthopedic aftercare following surgical amputation: Secondary | ICD-10-CM | POA: Diagnosis not present

## 2018-05-07 DIAGNOSIS — Z741 Need for assistance with personal care: Secondary | ICD-10-CM | POA: Diagnosis not present

## 2018-05-08 DIAGNOSIS — M6281 Muscle weakness (generalized): Secondary | ICD-10-CM | POA: Diagnosis not present

## 2018-05-08 DIAGNOSIS — Z4781 Encounter for orthopedic aftercare following surgical amputation: Secondary | ICD-10-CM | POA: Diagnosis not present

## 2018-05-08 DIAGNOSIS — Z741 Need for assistance with personal care: Secondary | ICD-10-CM | POA: Diagnosis not present

## 2018-05-09 DIAGNOSIS — Z741 Need for assistance with personal care: Secondary | ICD-10-CM | POA: Diagnosis not present

## 2018-05-09 DIAGNOSIS — Z4781 Encounter for orthopedic aftercare following surgical amputation: Secondary | ICD-10-CM | POA: Diagnosis not present

## 2018-05-09 DIAGNOSIS — M6281 Muscle weakness (generalized): Secondary | ICD-10-CM | POA: Diagnosis not present

## 2018-05-10 DIAGNOSIS — Z741 Need for assistance with personal care: Secondary | ICD-10-CM | POA: Diagnosis not present

## 2018-05-10 DIAGNOSIS — Z4781 Encounter for orthopedic aftercare following surgical amputation: Secondary | ICD-10-CM | POA: Diagnosis not present

## 2018-05-10 DIAGNOSIS — M6281 Muscle weakness (generalized): Secondary | ICD-10-CM | POA: Diagnosis not present

## 2018-05-13 DIAGNOSIS — D649 Anemia, unspecified: Secondary | ICD-10-CM | POA: Diagnosis not present

## 2018-05-13 DIAGNOSIS — Z4781 Encounter for orthopedic aftercare following surgical amputation: Secondary | ICD-10-CM | POA: Diagnosis not present

## 2018-05-13 DIAGNOSIS — I10 Essential (primary) hypertension: Secondary | ICD-10-CM | POA: Diagnosis not present

## 2018-05-13 DIAGNOSIS — Z741 Need for assistance with personal care: Secondary | ICD-10-CM | POA: Diagnosis not present

## 2018-05-13 DIAGNOSIS — M6281 Muscle weakness (generalized): Secondary | ICD-10-CM | POA: Diagnosis not present

## 2018-05-13 DIAGNOSIS — E039 Hypothyroidism, unspecified: Secondary | ICD-10-CM | POA: Diagnosis not present

## 2018-05-14 DIAGNOSIS — Z741 Need for assistance with personal care: Secondary | ICD-10-CM | POA: Diagnosis not present

## 2018-05-14 DIAGNOSIS — M6281 Muscle weakness (generalized): Secondary | ICD-10-CM | POA: Diagnosis not present

## 2018-05-14 DIAGNOSIS — Z4781 Encounter for orthopedic aftercare following surgical amputation: Secondary | ICD-10-CM | POA: Diagnosis not present

## 2018-05-15 DIAGNOSIS — Z741 Need for assistance with personal care: Secondary | ICD-10-CM | POA: Diagnosis not present

## 2018-05-15 DIAGNOSIS — R4189 Other symptoms and signs involving cognitive functions and awareness: Secondary | ICD-10-CM | POA: Diagnosis not present

## 2018-05-15 DIAGNOSIS — M6281 Muscle weakness (generalized): Secondary | ICD-10-CM | POA: Diagnosis not present

## 2018-05-15 DIAGNOSIS — G8929 Other chronic pain: Secondary | ICD-10-CM | POA: Diagnosis not present

## 2018-05-15 DIAGNOSIS — M940 Chondrocostal junction syndrome [Tietze]: Secondary | ICD-10-CM | POA: Diagnosis not present

## 2018-05-15 DIAGNOSIS — G629 Polyneuropathy, unspecified: Secondary | ICD-10-CM | POA: Diagnosis not present

## 2018-05-15 DIAGNOSIS — Z4781 Encounter for orthopedic aftercare following surgical amputation: Secondary | ICD-10-CM | POA: Diagnosis not present

## 2018-05-15 DIAGNOSIS — F331 Major depressive disorder, recurrent, moderate: Secondary | ICD-10-CM | POA: Diagnosis not present

## 2018-05-16 DIAGNOSIS — Z4781 Encounter for orthopedic aftercare following surgical amputation: Secondary | ICD-10-CM | POA: Diagnosis not present

## 2018-05-16 DIAGNOSIS — M6281 Muscle weakness (generalized): Secondary | ICD-10-CM | POA: Diagnosis not present

## 2018-05-16 DIAGNOSIS — E1169 Type 2 diabetes mellitus with other specified complication: Secondary | ICD-10-CM | POA: Diagnosis not present

## 2018-05-16 DIAGNOSIS — G546 Phantom limb syndrome with pain: Secondary | ICD-10-CM | POA: Diagnosis not present

## 2018-05-16 DIAGNOSIS — Z741 Need for assistance with personal care: Secondary | ICD-10-CM | POA: Diagnosis not present

## 2018-05-16 DIAGNOSIS — F339 Major depressive disorder, recurrent, unspecified: Secondary | ICD-10-CM | POA: Diagnosis not present

## 2018-05-17 DIAGNOSIS — Z741 Need for assistance with personal care: Secondary | ICD-10-CM | POA: Diagnosis not present

## 2018-05-17 DIAGNOSIS — M6281 Muscle weakness (generalized): Secondary | ICD-10-CM | POA: Diagnosis not present

## 2018-05-17 DIAGNOSIS — Z4781 Encounter for orthopedic aftercare following surgical amputation: Secondary | ICD-10-CM | POA: Diagnosis not present

## 2018-05-20 DIAGNOSIS — Z741 Need for assistance with personal care: Secondary | ICD-10-CM | POA: Diagnosis not present

## 2018-05-20 DIAGNOSIS — Z4781 Encounter for orthopedic aftercare following surgical amputation: Secondary | ICD-10-CM | POA: Diagnosis not present

## 2018-05-20 DIAGNOSIS — M6281 Muscle weakness (generalized): Secondary | ICD-10-CM | POA: Diagnosis not present

## 2018-05-21 DIAGNOSIS — I10 Essential (primary) hypertension: Secondary | ICD-10-CM | POA: Diagnosis not present

## 2018-05-21 DIAGNOSIS — D649 Anemia, unspecified: Secondary | ICD-10-CM | POA: Diagnosis not present

## 2018-05-21 DIAGNOSIS — Z741 Need for assistance with personal care: Secondary | ICD-10-CM | POA: Diagnosis not present

## 2018-05-21 DIAGNOSIS — Z4781 Encounter for orthopedic aftercare following surgical amputation: Secondary | ICD-10-CM | POA: Diagnosis not present

## 2018-05-21 DIAGNOSIS — M6281 Muscle weakness (generalized): Secondary | ICD-10-CM | POA: Diagnosis not present

## 2018-05-22 DIAGNOSIS — R040 Epistaxis: Secondary | ICD-10-CM | POA: Diagnosis not present

## 2018-05-22 DIAGNOSIS — Z741 Need for assistance with personal care: Secondary | ICD-10-CM | POA: Diagnosis not present

## 2018-05-22 DIAGNOSIS — M6281 Muscle weakness (generalized): Secondary | ICD-10-CM | POA: Diagnosis not present

## 2018-05-22 DIAGNOSIS — Z4781 Encounter for orthopedic aftercare following surgical amputation: Secondary | ICD-10-CM | POA: Diagnosis not present

## 2018-05-23 DIAGNOSIS — R040 Epistaxis: Secondary | ICD-10-CM | POA: Diagnosis not present

## 2018-05-23 DIAGNOSIS — Z741 Need for assistance with personal care: Secondary | ICD-10-CM | POA: Diagnosis not present

## 2018-05-23 DIAGNOSIS — Z4781 Encounter for orthopedic aftercare following surgical amputation: Secondary | ICD-10-CM | POA: Diagnosis not present

## 2018-05-23 DIAGNOSIS — M6281 Muscle weakness (generalized): Secondary | ICD-10-CM | POA: Diagnosis not present

## 2018-05-24 DIAGNOSIS — M6281 Muscle weakness (generalized): Secondary | ICD-10-CM | POA: Diagnosis not present

## 2018-05-24 DIAGNOSIS — Z4781 Encounter for orthopedic aftercare following surgical amputation: Secondary | ICD-10-CM | POA: Diagnosis not present

## 2018-05-24 DIAGNOSIS — Z741 Need for assistance with personal care: Secondary | ICD-10-CM | POA: Diagnosis not present

## 2018-05-27 DIAGNOSIS — F331 Major depressive disorder, recurrent, moderate: Secondary | ICD-10-CM | POA: Diagnosis not present

## 2018-05-27 DIAGNOSIS — M6281 Muscle weakness (generalized): Secondary | ICD-10-CM | POA: Diagnosis not present

## 2018-05-27 DIAGNOSIS — I69311 Memory deficit following cerebral infarction: Secondary | ICD-10-CM | POA: Diagnosis not present

## 2018-05-27 DIAGNOSIS — Z4781 Encounter for orthopedic aftercare following surgical amputation: Secondary | ICD-10-CM | POA: Diagnosis not present

## 2018-05-27 DIAGNOSIS — Z741 Need for assistance with personal care: Secondary | ICD-10-CM | POA: Diagnosis not present

## 2018-05-27 DIAGNOSIS — F5105 Insomnia due to other mental disorder: Secondary | ICD-10-CM | POA: Diagnosis not present

## 2018-05-28 DIAGNOSIS — Z741 Need for assistance with personal care: Secondary | ICD-10-CM | POA: Diagnosis not present

## 2018-05-28 DIAGNOSIS — M6281 Muscle weakness (generalized): Secondary | ICD-10-CM | POA: Diagnosis not present

## 2018-05-28 DIAGNOSIS — Z4781 Encounter for orthopedic aftercare following surgical amputation: Secondary | ICD-10-CM | POA: Diagnosis not present

## 2018-05-29 DIAGNOSIS — E1169 Type 2 diabetes mellitus with other specified complication: Secondary | ICD-10-CM | POA: Diagnosis not present

## 2018-05-29 DIAGNOSIS — G629 Polyneuropathy, unspecified: Secondary | ICD-10-CM | POA: Diagnosis not present

## 2018-05-29 DIAGNOSIS — Z4781 Encounter for orthopedic aftercare following surgical amputation: Secondary | ICD-10-CM | POA: Diagnosis not present

## 2018-05-29 DIAGNOSIS — Z741 Need for assistance with personal care: Secondary | ICD-10-CM | POA: Diagnosis not present

## 2018-05-29 DIAGNOSIS — I10 Essential (primary) hypertension: Secondary | ICD-10-CM | POA: Diagnosis not present

## 2018-05-29 DIAGNOSIS — G546 Phantom limb syndrome with pain: Secondary | ICD-10-CM | POA: Diagnosis not present

## 2018-05-29 DIAGNOSIS — F331 Major depressive disorder, recurrent, moderate: Secondary | ICD-10-CM | POA: Diagnosis not present

## 2018-05-29 DIAGNOSIS — M6281 Muscle weakness (generalized): Secondary | ICD-10-CM | POA: Diagnosis not present

## 2018-05-30 DIAGNOSIS — Z741 Need for assistance with personal care: Secondary | ICD-10-CM | POA: Diagnosis not present

## 2018-05-30 DIAGNOSIS — Z4781 Encounter for orthopedic aftercare following surgical amputation: Secondary | ICD-10-CM | POA: Diagnosis not present

## 2018-05-30 DIAGNOSIS — M6281 Muscle weakness (generalized): Secondary | ICD-10-CM | POA: Diagnosis not present

## 2018-05-31 ENCOUNTER — Ambulatory Visit: Payer: Medicare Other | Admitting: Cardiology

## 2018-05-31 DIAGNOSIS — Z741 Need for assistance with personal care: Secondary | ICD-10-CM | POA: Diagnosis not present

## 2018-05-31 DIAGNOSIS — Z4781 Encounter for orthopedic aftercare following surgical amputation: Secondary | ICD-10-CM | POA: Diagnosis not present

## 2018-05-31 DIAGNOSIS — M6281 Muscle weakness (generalized): Secondary | ICD-10-CM | POA: Diagnosis not present

## 2018-06-03 DIAGNOSIS — Z4781 Encounter for orthopedic aftercare following surgical amputation: Secondary | ICD-10-CM | POA: Diagnosis not present

## 2018-06-03 DIAGNOSIS — M6281 Muscle weakness (generalized): Secondary | ICD-10-CM | POA: Diagnosis not present

## 2018-06-03 DIAGNOSIS — Z741 Need for assistance with personal care: Secondary | ICD-10-CM | POA: Diagnosis not present

## 2018-06-04 DIAGNOSIS — Z741 Need for assistance with personal care: Secondary | ICD-10-CM | POA: Diagnosis not present

## 2018-06-04 DIAGNOSIS — Z4781 Encounter for orthopedic aftercare following surgical amputation: Secondary | ICD-10-CM | POA: Diagnosis not present

## 2018-06-04 DIAGNOSIS — M6281 Muscle weakness (generalized): Secondary | ICD-10-CM | POA: Diagnosis not present

## 2018-06-05 DIAGNOSIS — Z4781 Encounter for orthopedic aftercare following surgical amputation: Secondary | ICD-10-CM | POA: Diagnosis not present

## 2018-06-05 DIAGNOSIS — M6281 Muscle weakness (generalized): Secondary | ICD-10-CM | POA: Diagnosis not present

## 2018-06-05 DIAGNOSIS — Z741 Need for assistance with personal care: Secondary | ICD-10-CM | POA: Diagnosis not present

## 2018-06-07 DIAGNOSIS — M6281 Muscle weakness (generalized): Secondary | ICD-10-CM | POA: Diagnosis not present

## 2018-06-07 DIAGNOSIS — Z4781 Encounter for orthopedic aftercare following surgical amputation: Secondary | ICD-10-CM | POA: Diagnosis not present

## 2018-06-07 DIAGNOSIS — Z741 Need for assistance with personal care: Secondary | ICD-10-CM | POA: Diagnosis not present

## 2018-06-10 DIAGNOSIS — M6281 Muscle weakness (generalized): Secondary | ICD-10-CM | POA: Diagnosis not present

## 2018-06-10 DIAGNOSIS — Z741 Need for assistance with personal care: Secondary | ICD-10-CM | POA: Diagnosis not present

## 2018-06-10 DIAGNOSIS — Z4781 Encounter for orthopedic aftercare following surgical amputation: Secondary | ICD-10-CM | POA: Diagnosis not present

## 2018-06-11 DIAGNOSIS — M6281 Muscle weakness (generalized): Secondary | ICD-10-CM | POA: Diagnosis not present

## 2018-06-11 DIAGNOSIS — Z741 Need for assistance with personal care: Secondary | ICD-10-CM | POA: Diagnosis not present

## 2018-06-11 DIAGNOSIS — Z4781 Encounter for orthopedic aftercare following surgical amputation: Secondary | ICD-10-CM | POA: Diagnosis not present

## 2018-06-12 DIAGNOSIS — Z4781 Encounter for orthopedic aftercare following surgical amputation: Secondary | ICD-10-CM | POA: Diagnosis not present

## 2018-06-12 DIAGNOSIS — Z741 Need for assistance with personal care: Secondary | ICD-10-CM | POA: Diagnosis not present

## 2018-06-12 DIAGNOSIS — F331 Major depressive disorder, recurrent, moderate: Secondary | ICD-10-CM | POA: Diagnosis not present

## 2018-06-12 DIAGNOSIS — M6281 Muscle weakness (generalized): Secondary | ICD-10-CM | POA: Diagnosis not present

## 2018-06-14 DIAGNOSIS — M6281 Muscle weakness (generalized): Secondary | ICD-10-CM | POA: Diagnosis not present

## 2018-06-14 DIAGNOSIS — Z741 Need for assistance with personal care: Secondary | ICD-10-CM | POA: Diagnosis not present

## 2018-06-14 DIAGNOSIS — Z4781 Encounter for orthopedic aftercare following surgical amputation: Secondary | ICD-10-CM | POA: Diagnosis not present

## 2018-06-15 DIAGNOSIS — Z4781 Encounter for orthopedic aftercare following surgical amputation: Secondary | ICD-10-CM | POA: Diagnosis not present

## 2018-06-15 DIAGNOSIS — M6281 Muscle weakness (generalized): Secondary | ICD-10-CM | POA: Diagnosis not present

## 2018-06-15 DIAGNOSIS — Z741 Need for assistance with personal care: Secondary | ICD-10-CM | POA: Diagnosis not present

## 2018-06-17 DIAGNOSIS — M6281 Muscle weakness (generalized): Secondary | ICD-10-CM | POA: Diagnosis not present

## 2018-06-17 DIAGNOSIS — Z741 Need for assistance with personal care: Secondary | ICD-10-CM | POA: Diagnosis not present

## 2018-06-17 DIAGNOSIS — Z4781 Encounter for orthopedic aftercare following surgical amputation: Secondary | ICD-10-CM | POA: Diagnosis not present

## 2018-06-18 DIAGNOSIS — G546 Phantom limb syndrome with pain: Secondary | ICD-10-CM | POA: Diagnosis not present

## 2018-06-24 DIAGNOSIS — I69311 Memory deficit following cerebral infarction: Secondary | ICD-10-CM | POA: Diagnosis not present

## 2018-06-24 DIAGNOSIS — F331 Major depressive disorder, recurrent, moderate: Secondary | ICD-10-CM | POA: Diagnosis not present

## 2018-06-24 DIAGNOSIS — F5105 Insomnia due to other mental disorder: Secondary | ICD-10-CM | POA: Diagnosis not present

## 2018-06-25 DIAGNOSIS — F331 Major depressive disorder, recurrent, moderate: Secondary | ICD-10-CM | POA: Diagnosis not present

## 2018-06-26 DIAGNOSIS — R609 Edema, unspecified: Secondary | ICD-10-CM | POA: Diagnosis not present

## 2018-06-26 DIAGNOSIS — R100 Acute abdomen: Secondary | ICD-10-CM | POA: Diagnosis not present

## 2018-06-26 DIAGNOSIS — E1169 Type 2 diabetes mellitus with other specified complication: Secondary | ICD-10-CM | POA: Diagnosis not present

## 2018-06-26 DIAGNOSIS — R143 Flatulence: Secondary | ICD-10-CM | POA: Diagnosis not present

## 2018-06-27 DIAGNOSIS — R609 Edema, unspecified: Secondary | ICD-10-CM | POA: Diagnosis not present

## 2018-06-28 DIAGNOSIS — I10 Essential (primary) hypertension: Secondary | ICD-10-CM | POA: Diagnosis not present

## 2018-06-28 DIAGNOSIS — E1169 Type 2 diabetes mellitus with other specified complication: Secondary | ICD-10-CM | POA: Diagnosis not present

## 2018-07-09 DIAGNOSIS — R0602 Shortness of breath: Secondary | ICD-10-CM | POA: Diagnosis not present

## 2018-07-09 DIAGNOSIS — F331 Major depressive disorder, recurrent, moderate: Secondary | ICD-10-CM | POA: Diagnosis not present

## 2018-07-10 DIAGNOSIS — J441 Chronic obstructive pulmonary disease with (acute) exacerbation: Secondary | ICD-10-CM | POA: Diagnosis not present

## 2018-07-10 DIAGNOSIS — I10 Essential (primary) hypertension: Secondary | ICD-10-CM | POA: Diagnosis not present

## 2018-07-22 DIAGNOSIS — F331 Major depressive disorder, recurrent, moderate: Secondary | ICD-10-CM | POA: Diagnosis not present

## 2018-07-22 DIAGNOSIS — I69311 Memory deficit following cerebral infarction: Secondary | ICD-10-CM | POA: Diagnosis not present

## 2018-07-22 DIAGNOSIS — F5105 Insomnia due to other mental disorder: Secondary | ICD-10-CM | POA: Diagnosis not present

## 2018-07-23 DIAGNOSIS — F331 Major depressive disorder, recurrent, moderate: Secondary | ICD-10-CM | POA: Diagnosis not present

## 2018-07-23 DIAGNOSIS — J811 Chronic pulmonary edema: Secondary | ICD-10-CM | POA: Diagnosis not present

## 2018-07-23 DIAGNOSIS — I10 Essential (primary) hypertension: Secondary | ICD-10-CM | POA: Diagnosis not present

## 2018-07-23 DIAGNOSIS — I517 Cardiomegaly: Secondary | ICD-10-CM | POA: Diagnosis not present

## 2018-07-23 DIAGNOSIS — D649 Anemia, unspecified: Secondary | ICD-10-CM | POA: Diagnosis not present

## 2018-07-30 DIAGNOSIS — I2510 Atherosclerotic heart disease of native coronary artery without angina pectoris: Secondary | ICD-10-CM | POA: Diagnosis not present

## 2018-07-30 DIAGNOSIS — E1169 Type 2 diabetes mellitus with other specified complication: Secondary | ICD-10-CM | POA: Diagnosis not present

## 2018-07-30 DIAGNOSIS — J449 Chronic obstructive pulmonary disease, unspecified: Secondary | ICD-10-CM | POA: Diagnosis not present

## 2018-07-30 DIAGNOSIS — E039 Hypothyroidism, unspecified: Secondary | ICD-10-CM | POA: Diagnosis not present

## 2018-07-31 DIAGNOSIS — E1169 Type 2 diabetes mellitus with other specified complication: Secondary | ICD-10-CM | POA: Diagnosis not present

## 2018-07-31 DIAGNOSIS — I10 Essential (primary) hypertension: Secondary | ICD-10-CM | POA: Diagnosis not present

## 2018-08-06 DIAGNOSIS — I69311 Memory deficit following cerebral infarction: Secondary | ICD-10-CM | POA: Diagnosis not present

## 2018-08-06 DIAGNOSIS — F331 Major depressive disorder, recurrent, moderate: Secondary | ICD-10-CM | POA: Diagnosis not present

## 2018-08-06 DIAGNOSIS — F5105 Insomnia due to other mental disorder: Secondary | ICD-10-CM | POA: Diagnosis not present

## 2018-08-14 ENCOUNTER — Ambulatory Visit: Payer: Medicare Other | Admitting: Cardiology

## 2018-08-16 DIAGNOSIS — Z20828 Contact with and (suspected) exposure to other viral communicable diseases: Secondary | ICD-10-CM | POA: Diagnosis not present

## 2018-08-20 DIAGNOSIS — F331 Major depressive disorder, recurrent, moderate: Secondary | ICD-10-CM | POA: Diagnosis not present

## 2018-08-30 DIAGNOSIS — Z20828 Contact with and (suspected) exposure to other viral communicable diseases: Secondary | ICD-10-CM | POA: Diagnosis not present

## 2018-08-30 DIAGNOSIS — F5105 Insomnia due to other mental disorder: Secondary | ICD-10-CM | POA: Diagnosis not present

## 2018-08-30 DIAGNOSIS — F331 Major depressive disorder, recurrent, moderate: Secondary | ICD-10-CM | POA: Diagnosis not present

## 2018-08-30 DIAGNOSIS — I69311 Memory deficit following cerebral infarction: Secondary | ICD-10-CM | POA: Diagnosis not present

## 2018-09-02 DIAGNOSIS — I10 Essential (primary) hypertension: Secondary | ICD-10-CM | POA: Diagnosis not present

## 2018-09-02 DIAGNOSIS — E1169 Type 2 diabetes mellitus with other specified complication: Secondary | ICD-10-CM | POA: Diagnosis not present

## 2018-09-03 DIAGNOSIS — F331 Major depressive disorder, recurrent, moderate: Secondary | ICD-10-CM | POA: Diagnosis not present

## 2018-09-03 DIAGNOSIS — I69311 Memory deficit following cerebral infarction: Secondary | ICD-10-CM | POA: Diagnosis not present

## 2018-09-03 DIAGNOSIS — F5105 Insomnia due to other mental disorder: Secondary | ICD-10-CM | POA: Diagnosis not present

## 2018-09-07 DIAGNOSIS — Z20828 Contact with and (suspected) exposure to other viral communicable diseases: Secondary | ICD-10-CM | POA: Diagnosis not present

## 2018-09-12 DIAGNOSIS — R9431 Abnormal electrocardiogram [ECG] [EKG]: Secondary | ICD-10-CM | POA: Diagnosis not present

## 2018-09-12 DIAGNOSIS — R000 Tachycardia, unspecified: Secondary | ICD-10-CM | POA: Diagnosis not present

## 2018-09-13 DIAGNOSIS — F5105 Insomnia due to other mental disorder: Secondary | ICD-10-CM | POA: Diagnosis not present

## 2018-09-13 DIAGNOSIS — F331 Major depressive disorder, recurrent, moderate: Secondary | ICD-10-CM | POA: Diagnosis not present

## 2018-09-15 DIAGNOSIS — Z20828 Contact with and (suspected) exposure to other viral communicable diseases: Secondary | ICD-10-CM | POA: Diagnosis not present

## 2018-09-19 DIAGNOSIS — I2510 Atherosclerotic heart disease of native coronary artery without angina pectoris: Secondary | ICD-10-CM | POA: Diagnosis not present

## 2018-09-19 DIAGNOSIS — E039 Hypothyroidism, unspecified: Secondary | ICD-10-CM | POA: Diagnosis not present

## 2018-09-19 DIAGNOSIS — Z741 Need for assistance with personal care: Secondary | ICD-10-CM | POA: Diagnosis not present

## 2018-09-19 DIAGNOSIS — E1169 Type 2 diabetes mellitus with other specified complication: Secondary | ICD-10-CM | POA: Diagnosis not present

## 2018-09-19 DIAGNOSIS — M545 Low back pain: Secondary | ICD-10-CM | POA: Diagnosis not present

## 2018-09-19 DIAGNOSIS — M6281 Muscle weakness (generalized): Secondary | ICD-10-CM | POA: Diagnosis not present

## 2018-09-19 DIAGNOSIS — G629 Polyneuropathy, unspecified: Secondary | ICD-10-CM | POA: Diagnosis not present

## 2018-09-20 DIAGNOSIS — Z20828 Contact with and (suspected) exposure to other viral communicable diseases: Secondary | ICD-10-CM | POA: Diagnosis not present

## 2018-09-20 DIAGNOSIS — M545 Low back pain: Secondary | ICD-10-CM | POA: Diagnosis not present

## 2018-09-20 DIAGNOSIS — M6281 Muscle weakness (generalized): Secondary | ICD-10-CM | POA: Diagnosis not present

## 2018-09-20 DIAGNOSIS — Z741 Need for assistance with personal care: Secondary | ICD-10-CM | POA: Diagnosis not present

## 2018-09-23 DIAGNOSIS — M6281 Muscle weakness (generalized): Secondary | ICD-10-CM | POA: Diagnosis not present

## 2018-09-23 DIAGNOSIS — Z741 Need for assistance with personal care: Secondary | ICD-10-CM | POA: Diagnosis not present

## 2018-09-23 DIAGNOSIS — M545 Low back pain: Secondary | ICD-10-CM | POA: Diagnosis not present

## 2018-09-24 DIAGNOSIS — I69311 Memory deficit following cerebral infarction: Secondary | ICD-10-CM | POA: Diagnosis not present

## 2018-09-24 DIAGNOSIS — M6281 Muscle weakness (generalized): Secondary | ICD-10-CM | POA: Diagnosis not present

## 2018-09-24 DIAGNOSIS — M545 Low back pain: Secondary | ICD-10-CM | POA: Diagnosis not present

## 2018-09-24 DIAGNOSIS — F331 Major depressive disorder, recurrent, moderate: Secondary | ICD-10-CM | POA: Diagnosis not present

## 2018-09-24 DIAGNOSIS — Z741 Need for assistance with personal care: Secondary | ICD-10-CM | POA: Diagnosis not present

## 2018-09-24 DIAGNOSIS — F5105 Insomnia due to other mental disorder: Secondary | ICD-10-CM | POA: Diagnosis not present

## 2018-09-25 DIAGNOSIS — Z741 Need for assistance with personal care: Secondary | ICD-10-CM | POA: Diagnosis not present

## 2018-09-25 DIAGNOSIS — M545 Low back pain: Secondary | ICD-10-CM | POA: Diagnosis not present

## 2018-09-25 DIAGNOSIS — M6281 Muscle weakness (generalized): Secondary | ICD-10-CM | POA: Diagnosis not present

## 2018-09-26 ENCOUNTER — Other Ambulatory Visit: Payer: Self-pay

## 2018-09-26 ENCOUNTER — Encounter: Payer: Self-pay | Admitting: Cardiology

## 2018-09-26 ENCOUNTER — Telehealth (INDEPENDENT_AMBULATORY_CARE_PROVIDER_SITE_OTHER): Payer: Medicare Other | Admitting: Cardiology

## 2018-09-26 VITALS — BP 142/74 | HR 82 | Temp 97.9°F | Resp 22 | Ht 66.0 in | Wt 197.5 lb

## 2018-09-26 DIAGNOSIS — N183 Chronic kidney disease, stage 3 unspecified: Secondary | ICD-10-CM

## 2018-09-26 DIAGNOSIS — I5032 Chronic diastolic (congestive) heart failure: Secondary | ICD-10-CM

## 2018-09-26 DIAGNOSIS — I10 Essential (primary) hypertension: Secondary | ICD-10-CM

## 2018-09-26 DIAGNOSIS — M545 Low back pain: Secondary | ICD-10-CM | POA: Diagnosis not present

## 2018-09-26 DIAGNOSIS — Z741 Need for assistance with personal care: Secondary | ICD-10-CM | POA: Diagnosis not present

## 2018-09-26 DIAGNOSIS — I25119 Atherosclerotic heart disease of native coronary artery with unspecified angina pectoris: Secondary | ICD-10-CM

## 2018-09-26 DIAGNOSIS — I739 Peripheral vascular disease, unspecified: Secondary | ICD-10-CM

## 2018-09-26 DIAGNOSIS — M6281 Muscle weakness (generalized): Secondary | ICD-10-CM | POA: Diagnosis not present

## 2018-09-26 NOTE — Progress Notes (Signed)
Virtual Visit via Video Note   This visit type was conducted due to national recommendations for restrictions regarding the COVID-19 Pandemic (e.g. social distancing) in an effort to limit this patient's exposure and mitigate transmission in our community.  Due to her co-morbid illnesses, this patient is at least at moderate risk for complications without adequate follow up.  This format is felt to be most appropriate for this patient at this time.  All issues noted in this document were discussed and addressed.  A limited physical exam was performed with this format.  Please refer to the patient's chart for her consent to telehealth for [facility].   Date:  09/26/2018   ID:  [name] [name], DOB 1945/11/26, MRN [id number]  Patient Location: [place] Provider Location: Office  PCP:  Glenda Chroman, MD  Cardiologist:  Rozann Lesches, MD Electrophysiologist:  None   Evaluation Performed:  Follow-Up Visit  Chief Complaint:   Cardiac follow-up  History of Present Illness:    [name] [name] is a 73 y.o. female last seen in February.  She continues to reside at the [facility].  We communicated by video conferencing today.  She appeared comfortable lying in bed, wearing nasal cannula.  She did not provide very much history, had difficulty finding words and carrying on a conversation.  I went over her medications which we reviewed compared to the [facility].  She continues on stable dose of Demadex and reportedly her weight is only fluctuated by a few pounds.  She did have lab work done in July which showed hemoglobin of 12.3 and creatinine of 1.43 with normal potassium.  She also had a recent ECG which reportedly showed sinus tachycardia with IVCD and occasional PVCs, actual tracing is not available for review.  SARS coronavirus 2 test was negative in August.   Past Medical History:  Diagnosis Date   Arthritis    Carotid artery disease ([place])    a. Bilateral CEA; 50-60%  bilateral ICA stenosis, 11/12   Cellulitis    a. Recurrent, bilateral legs   CHF (congestive heart failure) (HCC)    Chronic back pain    Chronic diastolic heart failure (HCC)    Coronary atherosclerosis of native coronary artery    a. 11/2010 NSTEMI/CABG x 4: L-LAD; S-PL; S-OM; S-DX, by PVT.   Critical lower limb ischemia    Depression    Essential hypertension    Herniated disc    History of blood transfusion    Hypothyroidism    Ischemic cardiomyopathy    a. 11/2010 TEE: EF 40-45%.   Migraine    Mixed hyperlipidemia    NSTEMI (non-ST elevated myocardial infarction) ([place]) 11/2010   On home oxygen therapy    PAD (peripheral artery disease) (HCC)    Pleural effusion    Pneumonia 2000's X 2   Stroke ([place]) 08/2009   Suicide attempt ([place]) 08/2002   TIA (transient ischemic attack)    TMJ syndrome    Type 2 diabetes mellitus ([place])    Venous insufficiency    Past Surgical History:  Procedure Laterality Date   ABDOMINAL HYSTERECTOMY     AMPUTATION Left 01/29/2018   Procedure: AMPUTATION ABOVE KNEE;  Surgeon: Angelia Mould, MD;  Location: [place];  Service: Vascular;  Laterality: Left;   APPENDECTOMY     CARDIAC CATHETERIZATION  11/2010   CARDIAC CATHETERIZATION N/A 09/16/2015   Procedure: Right Heart Cath;  Surgeon: Larey Dresser, MD;  Location: [place] [facility];  Service: Cardiovascular;  Laterality: N/A;   CAROTID ENDARTERECTOMY Bilateral 2011   Bilateral - Dr. Kellie Simmering   CHOLECYSTECTOMY     COLONOSCOPY W/ BIOPSIES AND POLYPECTOMY     CORONARY ANGIOPLASTY WITH STENT PLACEMENT  03/2013   "1"   CORONARY ARTERY BYPASS GRAFT  11/24/2010   Procedure: CORONARY ARTERY BYPASS GRAFTING (CABG);  Surgeon: Tharon Aquas Adelene Idler, MD;  Location: [place];  Service: Open Heart Surgery;  Laterality: N/A;  Coronary Artery Bypass Graft times four on pump utilizing left internal mammary artery and bilateral greater saphenous veins harvested endoscopically,  transesophageal echocardiogram    DEBRIDEMENT TOE Left    "nonhealing wound; 3rd digit   DILATION AND CURETTAGE OF UTERUS     ENDARTERECTOMY POPLITEAL Left 10/07/2013   Procedure: LEFT POPLITEAL ENDARTERECTOMY ;  Surgeon: Angelia Mould, MD;  Location: [place];  Service: Vascular;  Laterality: Left;   FEMORAL-POPLITEAL BYPASS GRAFT Left 10/07/2013   Procedure: LEFT FEMORAL-POPLITEAL ARTERY BYPASS GRAFT;  Surgeon: Angelia Mould, MD;  Location: [place];  Service: Vascular;  Laterality: Left;   FRACTURE SURGERY     INTRAOPERATIVE ARTERIOGRAM Left 10/07/2013   Procedure: INTRA OPERATIVE ARTERIOGRAM LEFT LEG;  Surgeon: Angelia Mould, MD;  Location: [place];  Service: Vascular;  Laterality: Left;   IR GENERIC HISTORICAL  09/21/2015   IR REMOVAL OF PLURAL CATH W/CUFF 09/21/2015 Marybelle Killings, MD [facility]-INTERV RAD   LEFT AND RIGHT HEART CATHETERIZATION WITH CORONARY/GRAFT ANGIOGRAM N/A 03/27/2013   Procedure: LEFT AND RIGHT HEART CATHETERIZATION WITH Beatrix Fetters;  Surgeon: Burnell Blanks, MD;  Location: [facility] CATH LAB;  Service: Cardiovascular;  Laterality: N/A;   LOWER EXTREMITY ANGIOGRAM  09/08/2012; 10/06/2013   "found 100% blockage; unsuccessful attempt at crossing a chronic total occlusion of the left SFA in the setting of critical limb ischemia   LOWER EXTREMITY ANGIOGRAM Bilateral 09/08/2013   Procedure: LOWER EXTREMITY ANGIOGRAM;  Surgeon: [name], MD;  Location: [facility] CATH LAB;  Service: Cardiovascular;  Laterality: Bilateral;   PERIPHERAL VASCULAR CATHETERIZATION  11/04/2015   Procedure: Peripheral Vascular Atherectomy;  Surgeon: [name], MD;  Location: [place] [facility];  Service: Cardiovascular;;  RT. SFA   PERIPHERAL VASCULAR CATHETERIZATION Bilateral 11/04/2015   Procedure: Lower Extremity Intervention;  Surgeon: [name], MD;  Location: [place] [facility];  Service: Cardiovascular;  Laterality: Bilateral;   PERIPHERAL VASCULAR  CATHETERIZATION  11/04/2015   Procedure: Peripheral Vascular Balloon Angioplasty;  Surgeon: [name], MD;  Location: [place] [facility];  Service: Cardiovascular;;  TP Trunk   TOE SURGERY Left    "put pin in 2nd toe"   TUBAL LIGATION     WRIST FRACTURE SURGERY Left    "grafted bone from hip to wrist"     Current Meds  Medication Sig   acetaminophen (TYLENOL) 500 MG tablet Take 500 mg by mouth every 6 (six) hours as needed for mild pain or moderate pain.    aspirin 81 MG EC tablet Take 81 mg by mouth daily.   atorvastatin (LIPITOR) 40 MG tablet TAKE 1 TABLET BY MOUTH ONCE DAILY AT  6  PM (Patient taking differently: Take 40 mg by mouth daily. )   citalopram (CELEXA) 20 MG tablet Take 20 mg by mouth daily.    clopidogrel (PLAVIX) 75 MG tablet TAKE 1 TABLET BY MOUTH ONCE DAILY WITH BREAKFAST (Patient taking differently: Take 75 mg by mouth daily. )   docusate sodium (COLACE) 100 MG capsule Take 200 mg by  mouth at bedtime as needed for mild constipation.   Dulaglutide (TRULICITY) 1.22 QM/2.5OI SOPN Inject into the skin once a week.   DULoxetine (CYMBALTA) 60 MG capsule    ferrous sulfate 325 (65 FE) MG tablet Take 325 mg by mouth 2 (two) times daily.   gabapentin (NEURONTIN) 100 MG capsule Take 400 mg by mouth 3 (three) times daily.    insulin detemir (LEVEMIR) 100 UNIT/ML injection Inject 14 Units into the skin 2 (two) times daily.    levothyroxine (SYNTHROID, LEVOTHROID) 75 MCG tablet Take 1 tablet (75 mcg total) by mouth daily before breakfast.   linagliptin (TRADJENTA) 5 MG TABS tablet Take 5 mg by mouth daily.   mirtazapine (REMERON) 15 MG tablet Take 15 mg by mouth at bedtime.   Multiple Vitamin (MULTIVITAMIN WITH MINERALS) TABS tablet Take 1 tablet by mouth daily.   nitroGLYCERIN (NITROSTAT) 0.4 MG SL tablet Place 1 tablet (0.4 mg total) under the tongue every 5 (five) minutes as needed for chest pain. Up to 3 doses. If no relief after 3rd dose, proceed to the  ED for an evaluation   nystatin cream (MYCOSTATIN) Apply 1 application topically 2 (two) times daily.   oxyCODONE (OXY IR/ROXICODONE) 5 MG immediate release tablet Take 1 tablet (5 mg total) by mouth 4 (four) times daily as needed for severe pain.   pantoprazole (PROTONIX) 40 MG tablet Take 40 mg by mouth daily.   potassium chloride SA (K-DUR,KLOR-CON) 20 MEQ tablet TAKE 1 TABLET BY MOUTH TWICE DAILY (Patient taking differently: Take 20 mEq by mouth 2 (two) times daily. )   tiZANidine (ZANAFLEX) 2 MG tablet Take 2 mg by mouth every 8 (eight) hours as needed for muscle spasms.    torsemide (DEMADEX) 20 MG tablet Take 2 tablets (40 mg total) by mouth 2 (two) times daily.     Allergies:   Other, Strawberry extract, Sulfa antibiotics, Coffee bean extract [coffea arabica], and Tape   Social History   Tobacco Use   Smoking status: Former Smoker    Packs/day: 3.00    Years: 50.00    Pack years: 150.00    Types: Cigarettes    Start date: 01/24/1956    Quit date: 10/09/2009    Years since quitting: 8.9   Smokeless tobacco: Never Used  Substance Use Topics   Alcohol use: Yes    Alcohol/week: 0.0 standard drinks    Comment: 10/06/2013 "might have a drink q 2-3 months"   Drug use: Yes    Types: Marijuana    Comment: "smoked pot in my teens"     Family Hx: The patient's family history includes Cancer in her brother and sister; Coronary artery disease in her father; Diabetes in her daughter, mother, and son; Diabetes type II in her mother; Heart attack in her father; Heart disease in her father and mother; Heart failure in her sister; Hyperlipidemia in her daughter, mother, and son; Hypertension in her mother.  ROS:   Please see the history of present illness. All other systems reviewed and are negative.   Prior CV studies:   The following studies were reviewed today:  Echocardiogram 08/21/2017: Study Conclusions  - Left ventricle: The cavity size was normal. Wall thickness  was increased in a pattern of moderate to severe LVH. Systolic function was normal. The estimated ejection fraction was in the range of 60% to 65%. Wall motion was normal; there were no regional wall motion abnormalities. Doppler parameters are consistent with restrictive physiology, indicative of decreased left ventricular  diastolic compliance and/or increased left atrial pressure. Doppler parameters are consistent with high ventricular filling pressure. - Aortic valve: Valve area (VTI): 1.95 cm^2. Valve area (Vmax): 1.73 cm^2. Valve area (Vmean): 1.83 cm^2. - Mitral valve: Mildly calcified annulus. Mildly thickened leaflets. - Left atrium: The atrium was severely dilated. - Right ventricle: The ventricular septum is flattend in diastole suggesting RV volume overload. The cavity size was moderately dilated. Systolic function was mildly reduced. - Right atrium: The atrium was mildly dilated. - Tricuspid valve: There was moderate regurgitation. The TR is eccentric and may be underestimated - Pulmonary arteries: Systolic pressure was moderately increased. PA peak pressure: 55 mm Hg (S). - Technically adequate study.  Labs/Other Tests and Data Reviewed:    EKG:  An ECG dated 01/28/2018 was personally reviewed today and demonstrated:  Sinus rhythm with PVC and nonspecific ST-T changes.  Recent Labs: 01/25/2018: ALT 19 02/01/2018: BUN 47; Creatinine, Ser 1.31; Hemoglobin 9.3; Platelets 147; Potassium 3.9; Sodium 137  July 2020: Hemoglobin 12.3, platelets 123, BUN 58, creatinine 1.43, potassium 4.1  Wt Readings from Last 3 Encounters:  09/26/18 197 lb 8 oz (89.6 kg)  02/26/18 159 lb (72.1 kg)  02/20/18 195 lb (88.5 kg)     Objective:    Vital Signs:  BP (!) 142/74    Pulse 82    Temp 97.9 F (36.6 C) (Oral)    Resp (!) 22    Ht _0  (1.676 m)    Wt 197 lb 8 oz (89.6 kg)    SpO2 95%    BMI 31.88 kg/m    General: Chronically ill-appearing woman, wearing  nasal cannula lying in bed. HEENT: Conjunctiva and lids normal. Skin: Pale. Lungs: Patient spoke in short sentences, not obviously short of breath, no coughing. Neuropsychiatric: Patient alert to name and place.  She spoke in short sentences, appeared to have difficulty with word finding.  ASSESSMENT & PLAN:    1.  Chronic diastolic heart failure with right ventricular dysfunction and pulmonary hypertension.  She continues to reside in the [facility].  Her weight is up by outside scale and in similar range to prior weight in February prior to her amputation.  She remains on Demadex 40 mg twice daily and recent creatinine was relatively stable at 1.43.  2.  CKD stage III, creatinine 1.43.  3.  Multivessel CAD status post CABG with subsequent graft intervention.  No obvious angina symptoms reported on medical therapy which includes dual antiplatelet regimen and statin.  No recent nitroglycerin use.  4.  Essential hypertension, systolic in the 235T today.  COVID-19 Education: The signs and symptoms of COVID-19 were discussed with the patient and how to seek care for testing (follow up with PCP or arrange E-visit).  The importance of social distancing was discussed today.  Time:   Today, I have spent 8 minutes with the patient with telehealth technology discussing the above problems.     Medication Adjustments/Labs and Tests Ordered: Current medicines are reviewed at length with the patient today.  Concerns regarding medicines are outlined above.   Tests Ordered: No orders of the defined types were placed in this encounter.   Medication Changes: No orders of the defined types were placed in this encounter.   Follow Up:  Virtual Visit 3 months.  Signed, Rozann Lesches, MD  09/26/2018 1:11 PM    [place]

## 2018-09-26 NOTE — Patient Instructions (Signed)
Medication Instructions: Your physician recommends that you continue on your current medications as directed. Please refer to the Current Medication list given to you today.   Labwork: None today  Procedures/Testing: None today  Follow-Up: 3 months "Virtual Phone Visit" with Dr.McDowell  Any Additional Special Instructions Will Be Listed Below (If Applicable).     If you need a refill on your cardiac medications before your next appointment, please call your pharmacy.     Thank you for choosing [facility] !

## 2018-09-27 ENCOUNTER — Ambulatory Visit: Payer: Medicare Other | Admitting: Cardiology

## 2018-09-27 DIAGNOSIS — M6281 Muscle weakness (generalized): Secondary | ICD-10-CM | POA: Diagnosis not present

## 2018-09-27 DIAGNOSIS — Z741 Need for assistance with personal care: Secondary | ICD-10-CM | POA: Diagnosis not present

## 2018-09-27 DIAGNOSIS — M545 Low back pain: Secondary | ICD-10-CM | POA: Diagnosis not present

## 2018-09-27 DIAGNOSIS — Z20828 Contact with and (suspected) exposure to other viral communicable diseases: Secondary | ICD-10-CM | POA: Diagnosis not present

## 2018-09-30 DIAGNOSIS — M545 Low back pain: Secondary | ICD-10-CM | POA: Diagnosis not present

## 2018-09-30 DIAGNOSIS — Z741 Need for assistance with personal care: Secondary | ICD-10-CM | POA: Diagnosis not present

## 2018-09-30 DIAGNOSIS — M6281 Muscle weakness (generalized): Secondary | ICD-10-CM | POA: Diagnosis not present

## 2018-10-01 DIAGNOSIS — M6281 Muscle weakness (generalized): Secondary | ICD-10-CM | POA: Diagnosis not present

## 2018-10-01 DIAGNOSIS — M545 Low back pain: Secondary | ICD-10-CM | POA: Diagnosis not present

## 2018-10-01 DIAGNOSIS — Z741 Need for assistance with personal care: Secondary | ICD-10-CM | POA: Diagnosis not present

## 2018-10-02 DIAGNOSIS — Z741 Need for assistance with personal care: Secondary | ICD-10-CM | POA: Diagnosis not present

## 2018-10-02 DIAGNOSIS — I10 Essential (primary) hypertension: Secondary | ICD-10-CM | POA: Diagnosis not present

## 2018-10-02 DIAGNOSIS — E1169 Type 2 diabetes mellitus with other specified complication: Secondary | ICD-10-CM | POA: Diagnosis not present

## 2018-10-02 DIAGNOSIS — M6281 Muscle weakness (generalized): Secondary | ICD-10-CM | POA: Diagnosis not present

## 2018-10-02 DIAGNOSIS — M545 Low back pain: Secondary | ICD-10-CM | POA: Diagnosis not present

## 2018-10-03 DIAGNOSIS — M545 Low back pain: Secondary | ICD-10-CM | POA: Diagnosis not present

## 2018-10-03 DIAGNOSIS — Z741 Need for assistance with personal care: Secondary | ICD-10-CM | POA: Diagnosis not present

## 2018-10-03 DIAGNOSIS — M6281 Muscle weakness (generalized): Secondary | ICD-10-CM | POA: Diagnosis not present

## 2018-10-04 DIAGNOSIS — M6281 Muscle weakness (generalized): Secondary | ICD-10-CM | POA: Diagnosis not present

## 2018-10-04 DIAGNOSIS — Z741 Need for assistance with personal care: Secondary | ICD-10-CM | POA: Diagnosis not present

## 2018-10-04 DIAGNOSIS — M545 Low back pain: Secondary | ICD-10-CM | POA: Diagnosis not present

## 2018-10-05 DIAGNOSIS — Z20828 Contact with and (suspected) exposure to other viral communicable diseases: Secondary | ICD-10-CM | POA: Diagnosis not present

## 2018-10-07 DIAGNOSIS — Z741 Need for assistance with personal care: Secondary | ICD-10-CM | POA: Diagnosis not present

## 2018-10-07 DIAGNOSIS — M545 Low back pain: Secondary | ICD-10-CM | POA: Diagnosis not present

## 2018-10-07 DIAGNOSIS — M6281 Muscle weakness (generalized): Secondary | ICD-10-CM | POA: Diagnosis not present

## 2018-10-08 DIAGNOSIS — M6281 Muscle weakness (generalized): Secondary | ICD-10-CM | POA: Diagnosis not present

## 2018-10-08 DIAGNOSIS — F331 Major depressive disorder, recurrent, moderate: Secondary | ICD-10-CM | POA: Diagnosis not present

## 2018-10-08 DIAGNOSIS — I69311 Memory deficit following cerebral infarction: Secondary | ICD-10-CM | POA: Diagnosis not present

## 2018-10-08 DIAGNOSIS — Z741 Need for assistance with personal care: Secondary | ICD-10-CM | POA: Diagnosis not present

## 2018-10-08 DIAGNOSIS — F5105 Insomnia due to other mental disorder: Secondary | ICD-10-CM | POA: Diagnosis not present

## 2018-10-08 DIAGNOSIS — M545 Low back pain: Secondary | ICD-10-CM | POA: Diagnosis not present

## 2018-10-09 DIAGNOSIS — Z741 Need for assistance with personal care: Secondary | ICD-10-CM | POA: Diagnosis not present

## 2018-10-09 DIAGNOSIS — M545 Low back pain: Secondary | ICD-10-CM | POA: Diagnosis not present

## 2018-10-09 DIAGNOSIS — M6281 Muscle weakness (generalized): Secondary | ICD-10-CM | POA: Diagnosis not present

## 2018-10-10 DIAGNOSIS — I2510 Atherosclerotic heart disease of native coronary artery without angina pectoris: Secondary | ICD-10-CM | POA: Diagnosis not present

## 2018-10-10 DIAGNOSIS — E1169 Type 2 diabetes mellitus with other specified complication: Secondary | ICD-10-CM | POA: Diagnosis not present

## 2018-10-10 DIAGNOSIS — I739 Peripheral vascular disease, unspecified: Secondary | ICD-10-CM | POA: Diagnosis not present

## 2018-10-10 DIAGNOSIS — I10 Essential (primary) hypertension: Secondary | ICD-10-CM | POA: Diagnosis not present

## 2018-10-18 DIAGNOSIS — Z6830 Body mass index (BMI) 30.0-30.9, adult: Secondary | ICD-10-CM | POA: Diagnosis not present

## 2018-10-18 DIAGNOSIS — I509 Heart failure, unspecified: Secondary | ICD-10-CM | POA: Diagnosis not present

## 2018-10-18 DIAGNOSIS — Z299 Encounter for prophylactic measures, unspecified: Secondary | ICD-10-CM | POA: Diagnosis not present

## 2018-10-18 DIAGNOSIS — F329 Major depressive disorder, single episode, unspecified: Secondary | ICD-10-CM | POA: Diagnosis not present

## 2018-10-18 DIAGNOSIS — K219 Gastro-esophageal reflux disease without esophagitis: Secondary | ICD-10-CM | POA: Diagnosis not present

## 2018-10-18 DIAGNOSIS — E1165 Type 2 diabetes mellitus with hyperglycemia: Secondary | ICD-10-CM | POA: Diagnosis not present

## 2018-11-27 DIAGNOSIS — E1165 Type 2 diabetes mellitus with hyperglycemia: Secondary | ICD-10-CM | POA: Diagnosis not present

## 2018-11-27 DIAGNOSIS — E1142 Type 2 diabetes mellitus with diabetic polyneuropathy: Secondary | ICD-10-CM | POA: Diagnosis not present

## 2018-11-27 DIAGNOSIS — Z299 Encounter for prophylactic measures, unspecified: Secondary | ICD-10-CM | POA: Diagnosis not present

## 2018-11-27 DIAGNOSIS — Z6830 Body mass index (BMI) 30.0-30.9, adult: Secondary | ICD-10-CM | POA: Diagnosis not present

## 2018-11-27 DIAGNOSIS — I509 Heart failure, unspecified: Secondary | ICD-10-CM | POA: Diagnosis not present

## 2018-11-27 DIAGNOSIS — J069 Acute upper respiratory infection, unspecified: Secondary | ICD-10-CM | POA: Diagnosis not present

## 2018-12-17 DIAGNOSIS — I2510 Atherosclerotic heart disease of native coronary artery without angina pectoris: Secondary | ICD-10-CM | POA: Diagnosis not present

## 2018-12-17 DIAGNOSIS — I509 Heart failure, unspecified: Secondary | ICD-10-CM | POA: Diagnosis not present

## 2018-12-17 DIAGNOSIS — E119 Type 2 diabetes mellitus without complications: Secondary | ICD-10-CM | POA: Diagnosis not present

## 2019-01-05 DIAGNOSIS — L89152 Pressure ulcer of sacral region, stage 2: Secondary | ICD-10-CM | POA: Diagnosis not present

## 2019-01-06 ENCOUNTER — Telehealth (INDEPENDENT_AMBULATORY_CARE_PROVIDER_SITE_OTHER): Payer: Medicare Other | Admitting: Cardiology

## 2019-01-06 ENCOUNTER — Encounter: Payer: Self-pay | Admitting: Cardiology

## 2019-01-06 DIAGNOSIS — I10 Essential (primary) hypertension: Secondary | ICD-10-CM

## 2019-01-06 DIAGNOSIS — I130 Hypertensive heart and chronic kidney disease with heart failure and stage 1 through stage 4 chronic kidney disease, or unspecified chronic kidney disease: Secondary | ICD-10-CM | POA: Diagnosis not present

## 2019-01-06 DIAGNOSIS — I5032 Chronic diastolic (congestive) heart failure: Secondary | ICD-10-CM

## 2019-01-06 DIAGNOSIS — I25119 Atherosclerotic heart disease of native coronary artery with unspecified angina pectoris: Secondary | ICD-10-CM

## 2019-01-06 DIAGNOSIS — N1832 Chronic kidney disease, stage 3b: Secondary | ICD-10-CM

## 2019-01-06 MED ORDER — TORSEMIDE 20 MG PO TABS: 80.0000 mg | ORAL_TABLET | Freq: Two times a day (BID) | ORAL | 6 refills | Status: AC

## 2019-01-06 MED ORDER — POTASSIUM CHLORIDE CRYS ER 20 MEQ PO TBCR: 20.0000 meq | EXTENDED_RELEASE_TABLET | Freq: Two times a day (BID) | ORAL | 3 refills | Status: AC

## 2019-01-06 NOTE — Patient Instructions (Signed)
Medication Instructions:  Continue all current medications.  Labwork:  CBC, BMET - orders enclosed.  Home health may draw & fax results to office  Office will contact with results via phone or letter.    Testing/Procedures: none  Follow-Up: 2 months   Any Other Special Instructions Will Be Listed Below (If Applicable).  If you need a refill on your cardiac medications before your next appointment, please call your pharmacy.

## 2019-01-06 NOTE — Progress Notes (Signed)
Virtual Visit via Telephone Note   This visit type was conducted due to national recommendations for restrictions regarding the COVID-19 Pandemic (e.g. social distancing) in an effort to limit this patient's exposure and mitigate transmission in our community.  Due to her co-morbid illnesses, this patient is at least at moderate risk for complications without adequate follow up.  This format is felt to be most appropriate for this patient at this time.  The patient did not have access to video technology/had technical difficulties with video requiring transitioning to audio format only (telephone).  All issues noted in this document were discussed and addressed.  No physical exam could be performed with this format.  Please refer to the patient's chart for her  consent to telehealth for [facility].   Date:  01/06/2019   ID:  [name] [name], DOB [birth year], MRN [id number]  Patient Location: Home Provider Location: Office  PCP:  Glenda Chroman, MD  Cardiologist:  Rozann Lesches, MD Electrophysiologist:  None    Evaluation Performed:  Follow-Up Visit  Chief Complaint:  Cardiac follow-up  History of Present Illness:    [name] [name] is a 73 y.o. female last assessed via telehealth encounter in September.  We spoke by phone today, I mainly communicated with her daughter.  She was discharged from the [facility] back in early October, has been home since then.  Patient's daughter indicates that her mother was substantially fluid overloaded when she first came home, Demadex has been increased back to 80 mg twice daily since then and she appears better.  She is not able to stand up or get a weight however.  She does have home health nursing, no recent lab work for review.  I reviewed her medications which are listed below.  At some point along the way she was taken off Plavix, details not clear.  Patient's daughter states that her mother has not had any obvious bleeding or changes in stools.   She was previously on an iron supplement.  Also no longer on hydralazine.  The patient does not have symptoms concerning for COVID-19 infection (fever, chills, cough, or new shortness of breath).    Past Medical History:  Diagnosis Date  . Arthritis   . Carotid artery disease (HCC)    a. Bilateral CEA; 50-60% bilateral ICA stenosis, 11/12  . Cellulitis    a. Recurrent, bilateral legs  . Chronic back pain   . Chronic diastolic heart failure ([place])   . Coronary atherosclerosis of native coronary artery    a. 11/2010 NSTEMI/CABG x 4: L-LAD; S-PL; S-OM; S-DX, by PVT.  [name] Critical lower limb ischemia   . Depression   . Essential hypertension   . Herniated disc   . History of blood transfusion   . Hypothyroidism   . Ischemic cardiomyopathy    a. 11/2010 TEE: EF 40-45%.  . Migraine   . Mixed hyperlipidemia   . NSTEMI (non-ST elevated myocardial infarction) ([place]) 11/2010  . On home oxygen therapy   . PAD (peripheral artery disease) ([place])   . Pleural effusion   . Pneumonia 2000's X 2  . Stroke ([place]) 08/2009  . Suicide attempt ([place]) 08/2002  . TIA (transient ischemic attack)   . TMJ syndrome   . Type 2 diabetes mellitus ([place])   . Venous insufficiency    Past Surgical History:  Procedure Laterality Date  . ABDOMINAL HYSTERECTOMY    . AMPUTATION Left 01/29/2018   Procedure: AMPUTATION ABOVE KNEE;  Surgeon: Scot Dock,  Judeth Cornfield, MD;  Location: [place];  Service: Vascular;  Laterality: Left;  . APPENDECTOMY    . CARDIAC CATHETERIZATION  11/2010  . CARDIAC CATHETERIZATION N/A 09/16/2015   Procedure: Right Heart Cath;  Surgeon: Larey Dresser, MD;  Location: [place] CV LAB;  Service: Cardiovascular;  Laterality: N/A;  . CAROTID ENDARTERECTOMY Bilateral 2011   Bilateral - Dr. Kellie Simmering  . CHOLECYSTECTOMY    . COLONOSCOPY W/ BIOPSIES AND POLYPECTOMY    . CORONARY ANGIOPLASTY WITH STENT PLACEMENT  03/2013   "1"  . CORONARY ARTERY BYPASS GRAFT  11/24/2010   Procedure: CORONARY ARTERY  BYPASS GRAFTING (CABG);  Surgeon: Tharon Aquas Adelene Idler, MD;  Location: [place];  Service: Open Heart Surgery;  Laterality: N/A;  Coronary Artery Bypass Graft times four on pump utilizing left internal mammary artery and bilateral greater saphenous veins harvested endoscopically, transesophageal echocardiogram   . DEBRIDEMENT TOE Left    "nonhealing wound; 3rd digit  . DILATION AND CURETTAGE OF UTERUS    . ENDARTERECTOMY POPLITEAL Left 10/07/2013   Procedure: LEFT POPLITEAL ENDARTERECTOMY ;  Surgeon: Angelia Mould, MD;  Location: [name];  Service: Vascular;  Laterality: Left;  . FEMORAL-POPLITEAL BYPASS GRAFT Left 10/07/2013   Procedure: LEFT FEMORAL-POPLITEAL ARTERY BYPASS GRAFT;  Surgeon: Angelia Mould, MD;  Location: [place];  Service: Vascular;  Laterality: Left;  . FRACTURE SURGERY    . INTRAOPERATIVE ARTERIOGRAM Left 10/07/2013   Procedure: INTRA OPERATIVE ARTERIOGRAM LEFT LEG;  Surgeon: Angelia Mould, MD;  Location: [place];  Service: Vascular;  Laterality: Left;  . IR GENERIC HISTORICAL  09/21/2015   IR REMOVAL OF PLURAL CATH W/CUFF 09/21/2015 Marybelle Killings, MD [facility]-INTERV RAD  . LEFT AND RIGHT HEART CATHETERIZATION WITH CORONARY/GRAFT ANGIOGRAM N/A 03/27/2013   Procedure: LEFT AND RIGHT HEART CATHETERIZATION WITH Beatrix Fetters;  Surgeon: Burnell Blanks, MD;  Location: [facility] CATH LAB;  Service: Cardiovascular;  Laterality: N/A;  . LOWER EXTREMITY ANGIOGRAM  09/08/2012; 10/06/2013   "found 100% blockage; unsuccessful attempt at crossing a chronic total occlusion of the left SFA in the setting of critical limb ischemia  . LOWER EXTREMITY ANGIOGRAM Bilateral 09/08/2013   Procedure: LOWER EXTREMITY ANGIOGRAM;  Surgeon: [name], MD;  Location: [facility] CATH LAB;  Service: Cardiovascular;  Laterality: Bilateral;  . PERIPHERAL VASCULAR CATHETERIZATION  11/04/2015   Procedure: Peripheral Vascular Atherectomy;  Surgeon: [name], MD;  Location: [place] CV LAB;   Service: Cardiovascular;;  RT. SFA  . PERIPHERAL VASCULAR CATHETERIZATION Bilateral 11/04/2015   Procedure: Lower Extremity Intervention;  Surgeon: [name], MD;  Location: [place] CV LAB;  Service: Cardiovascular;  Laterality: Bilateral;  . PERIPHERAL VASCULAR CATHETERIZATION  11/04/2015   Procedure: Peripheral Vascular Balloon Angioplasty;  Surgeon: [name], MD;  Location: [place] CV LAB;  Service: Cardiovascular;;  TP Trunk  . TOE SURGERY Left    "put pin in 2nd toe"  . TUBAL LIGATION    . WRIST FRACTURE SURGERY Left    "grafted bone from hip to wrist"     Current Meds  Medication Sig  . acetaminophen (TYLENOL) 500 MG tablet Take 500 mg by mouth every 6 (six) hours as needed for mild pain or moderate pain.   [name] aspirin 81 MG EC tablet Take 81 mg by mouth daily.  [name] atorvastatin (LIPITOR) 40 MG tablet TAKE 1 TABLET BY MOUTH ONCE DAILY AT  6  PM (Patient taking differently: Take 40 mg by mouth daily. )  . docusate  sodium (COLACE) 100 MG capsule Take 200 mg by mouth at bedtime as needed for mild constipation.  . Dulaglutide (TRULICITY) 8.52 DP/8.2UM SOPN Inject into the skin once a week.  . DULoxetine (CYMBALTA) 60 MG capsule Take 60 mg by mouth daily.   [name] gabapentin (NEURONTIN) 400 MG capsule Take 400 mg by mouth 3 (three) times daily.  . insulin detemir (LEVEMIR) 100 UNIT/ML injection 18 units every morning & 14 units every evening  . ipratropium-albuterol (DUONEB) 0.5-2.5 (3) MG/3ML SOLN Take 3 mLs by nebulization as needed.  [name] levothyroxine (SYNTHROID, LEVOTHROID) 75 MCG tablet Take 1 tablet (75 mcg total) by mouth daily before breakfast.  . linagliptin (TRADJENTA) 5 MG TABS tablet Take 5 mg by mouth daily.  . mirtazapine (REMERON) 15 MG tablet Take 15 mg by mouth at bedtime.  . Multiple Vitamin (MULTIVITAMIN WITH MINERALS) TABS tablet Take 1 tablet by mouth daily.  . nitroGLYCERIN (NITROSTAT) 0.4 MG SL tablet Place 1 tablet (0.4 mg total) under the tongue every  5 (five) minutes as needed for chest pain. Up to 3 doses. If no relief after 3rd dose, proceed to the ED for an evaluation  . nystatin cream (MYCOSTATIN) Apply 1 application topically 2 (two) times daily.  [name] oxyCODONE (OXY IR/ROXICODONE) 5 MG immediate release tablet Take 1 tablet (5 mg total) by mouth 4 (four) times daily as needed for severe pain.  . pantoprazole (PROTONIX) 40 MG tablet Take 40 mg by mouth daily.  . potassium chloride SA (KLOR-CON) 20 MEQ tablet Take 1 tablet (20 mEq total) by mouth 2 (two) times daily.  [name] torsemide (DEMADEX) 20 MG tablet Take 4 tablets (80 mg total) by mouth 2 (two) times daily.  . [DISCONTINUED] potassium chloride SA (K-DUR,KLOR-CON) 20 MEQ tablet TAKE 1 TABLET BY MOUTH TWICE DAILY (Patient taking differently: Take 20 mEq by mouth daily. )  . [DISCONTINUED] torsemide (DEMADEX) 20 MG tablet Take 80 mg by mouth 2 (two) times daily.     Allergies:   Other, Strawberry extract, Sulfa antibiotics, Coffee bean extract [coffea arabica], and Tape   Social History   Tobacco Use  . Smoking status: Former Smoker    Packs/day: 3.00    Years: 50.00    Pack years: 150.00    Types: Cigarettes    Start date: 01/24/1956    Quit date: 10/09/2009    Years since quitting: 9.2  . Smokeless tobacco: Never Used  Substance Use Topics  . Alcohol use: Yes    Alcohol/week: 0.0 standard drinks    Comment: 10/06/2013 "might have a drink q 2-3 months"  . Drug use: Yes    Types: Marijuana    Comment: "smoked pot in my teens"     Family Hx: The patient's family history includes Cancer in her sister; Coronary artery disease in her father; Diabetes in her daughter, mother, and son; Diabetes type II in her mother; Heart attack in her father; Heart disease in her father and mother; Heart failure in her sister; Hyperlipidemia in her daughter, mother, and son; Hypertension in her mother; Lung cancer in her brother.  ROS:   Please see the history of present illness. All other systems  reviewed and are negative.   Prior CV studies:   The following studies were reviewed today:  Echocardiogram 08/21/2017: Study Conclusions  - Left ventricle: The cavity size was normal. Wall thickness was increased in a pattern of moderate to severe LVH. Systolic function was normal. The estimated ejection fraction was in the range of  60% to 65%. Wall motion was normal; there were no regional wall motion abnormalities. Doppler parameters are consistent with restrictive physiology, indicative of decreased left ventricular diastolic compliance and/or increased left atrial pressure. Doppler parameters are consistent with high ventricular filling pressure. - Aortic valve: Valve area (VTI): 1.95 cm^2. Valve area (Vmax): 1.73 cm^2. Valve area (Vmean): 1.83 cm^2. - Mitral valve: Mildly calcified annulus. Mildly thickened leaflets. - Left atrium: The atrium was severely dilated. - Right ventricle: The ventricular septum is flattend in diastole suggesting RV volume overload. The cavity size was moderately dilated. Systolic function was mildly reduced. - Right atrium: The atrium was mildly dilated. - Tricuspid valve: There was moderate regurgitation. The TR is eccentric and may be underestimated - Pulmonary arteries: Systolic pressure was moderately increased. PA peak pressure: 55 mm Hg (S). - Technically adequate study.  Labs/Other Tests and Data Reviewed:    EKG:  An ECG dated 01/28/2018 was personally reviewed today and demonstrated:  Sinus rhythm with PVC and nonspecific ST-T changes.  Recent Labs: 01/25/2018: ALT 19 02/01/2018: BUN 47; Creatinine, Ser 1.31; Hemoglobin 9.3; Platelets 147; Potassium 3.9; Sodium 137  July 2020: Hemoglobin 12.3, platelets 123, creatinine 1.43, BUN 58, potassium 4.1  Wt Readings from Last 3 Encounters:  09/26/18 197 lb 8 oz (89.6 kg)  02/26/18 159 lb (72.1 kg)  02/20/18 195 lb (88.5 kg)     Objective:    Vital Signs:   There were no vitals taken for this visit.   Unable to obtain weight or vital signs today. Patient did not voice any concerns over the phone, I largely communicated with her daughter present.  ASSESSMENT & PLAN:    1.  Chronic diastolic heart failure with right ventricular dysfunction and pulmonary hypertension.  She is back home from the [facility] and daughter is primary caregiver.  Demadex is back up to 80 mg twice daily with prior potassium supplements.  Fluid status has reportedly improved significantly, although she is not able to weigh regularly as noted above.  Check BMET to follow-up on potassium and renal function.  2.  Multivessel CAD status post CABG and subsequent graft intervention.  No obvious angina symptoms reported.  She remains on aspirin and Lipitor.  No longer on Plavix.  3.  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  Follow-up lab work to be obtained.  4.  Essential hypertension by history.  She is no longer on hydralazine.  We will ask for the last few blood pressure recordings from [facility].  COVID-19 Education: The signs and symptoms of COVID-19 were discussed with the patient and how to seek care for testing (follow up with PCP or arrange E-visit).  The importance of social distancing was discussed today.  Time:   Today, I have spent 10 minutes with the patient with telehealth technology discussing the above problems.     Medication Adjustments/Labs and Tests Ordered: Current medicines are reviewed at length with the patient today.  Concerns regarding medicines are outlined above.   Tests Ordered: Orders Placed This Encounter  Procedures  . CBC  . Basic metabolic panel    Medication Changes: Meds ordered this encounter  Medications  . torsemide (DEMADEX) 20 MG tablet    Sig: Take 4 tablets (80 mg total) by mouth 2 (two) times daily.    Dispense:  240 tablet    Refill:  6  . potassium chloride SA (KLOR-CON) 20 MEQ tablet    Sig: Take 1 tablet (20  mEq total)  by mouth 2 (two) times daily.    Dispense:  180 tablet    Refill:  3    Follow Up:  Virtual Visit  2 months.  Signed, Rozann Lesches, MD  01/06/2019 12:32 PM    [place]

## 2019-01-08 ENCOUNTER — Encounter: Payer: Self-pay | Admitting: *Deleted

## 2019-01-15 DIAGNOSIS — I509 Heart failure, unspecified: Secondary | ICD-10-CM | POA: Diagnosis not present

## 2019-01-17 ENCOUNTER — Telehealth: Payer: Self-pay | Admitting: *Deleted

## 2019-01-17 NOTE — Telephone Encounter (Signed)
Daughter informed. Copy sen to PCP

## 2019-01-17 NOTE — Telephone Encounter (Signed)
-----  Message from Satira Sark, MD sent at 01/16/2019  9:53 AM EST ----- Results reviewed.  Hemoglobin normal.  Renal function and potassium also stable.  Continue with current diuretic regimen.

## 2019-01-21 DIAGNOSIS — N390 Urinary tract infection, site not specified: Secondary | ICD-10-CM | POA: Diagnosis not present

## 2019-01-29 DIAGNOSIS — B373 Candidiasis of vulva and vagina: Secondary | ICD-10-CM | POA: Diagnosis not present

## 2019-01-29 DIAGNOSIS — Z299 Encounter for prophylactic measures, unspecified: Secondary | ICD-10-CM | POA: Diagnosis not present

## 2019-01-29 DIAGNOSIS — Z6830 Body mass index (BMI) 30.0-30.9, adult: Secondary | ICD-10-CM | POA: Diagnosis not present

## 2019-01-29 DIAGNOSIS — I639 Cerebral infarction, unspecified: Secondary | ICD-10-CM | POA: Diagnosis not present

## 2019-01-29 DIAGNOSIS — I509 Heart failure, unspecified: Secondary | ICD-10-CM | POA: Diagnosis not present

## 2019-01-29 DIAGNOSIS — N390 Urinary tract infection, site not specified: Secondary | ICD-10-CM | POA: Diagnosis not present

## 2019-02-08 DIAGNOSIS — N184 Chronic kidney disease, stage 4 (severe): Secondary | ICD-10-CM | POA: Diagnosis not present

## 2019-02-08 DIAGNOSIS — L97819 Non-pressure chronic ulcer of other part of right lower leg with unspecified severity: Secondary | ICD-10-CM | POA: Diagnosis not present

## 2019-02-08 DIAGNOSIS — D509 Iron deficiency anemia, unspecified: Secondary | ICD-10-CM | POA: Diagnosis not present

## 2019-02-08 DIAGNOSIS — G20 Parkinson's disease: Secondary | ICD-10-CM | POA: Diagnosis not present

## 2019-02-08 DIAGNOSIS — M545 Low back pain: Secondary | ICD-10-CM | POA: Diagnosis not present

## 2019-02-08 DIAGNOSIS — E1151 Type 2 diabetes mellitus with diabetic peripheral angiopathy without gangrene: Secondary | ICD-10-CM | POA: Diagnosis not present

## 2019-02-08 DIAGNOSIS — I130 Hypertensive heart and chronic kidney disease with heart failure and stage 1 through stage 4 chronic kidney disease, or unspecified chronic kidney disease: Secondary | ICD-10-CM | POA: Diagnosis not present

## 2019-02-08 DIAGNOSIS — J449 Chronic obstructive pulmonary disease, unspecified: Secondary | ICD-10-CM | POA: Diagnosis not present

## 2019-02-08 DIAGNOSIS — Z4800 Encounter for change or removal of nonsurgical wound dressing: Secondary | ICD-10-CM | POA: Diagnosis not present

## 2019-02-08 DIAGNOSIS — I872 Venous insufficiency (chronic) (peripheral): Secondary | ICD-10-CM | POA: Diagnosis not present

## 2019-02-08 DIAGNOSIS — G546 Phantom limb syndrome with pain: Secondary | ICD-10-CM | POA: Diagnosis not present

## 2019-02-08 DIAGNOSIS — J069 Acute upper respiratory infection, unspecified: Secondary | ICD-10-CM | POA: Diagnosis not present

## 2019-02-08 DIAGNOSIS — E1122 Type 2 diabetes mellitus with diabetic chronic kidney disease: Secondary | ICD-10-CM | POA: Diagnosis not present

## 2019-02-08 DIAGNOSIS — E1165 Type 2 diabetes mellitus with hyperglycemia: Secondary | ICD-10-CM | POA: Diagnosis not present

## 2019-02-08 DIAGNOSIS — I5030 Unspecified diastolic (congestive) heart failure: Secondary | ICD-10-CM | POA: Diagnosis not present

## 2019-02-08 DIAGNOSIS — I2510 Atherosclerotic heart disease of native coronary artery without angina pectoris: Secondary | ICD-10-CM | POA: Diagnosis not present

## 2019-02-08 DIAGNOSIS — G8929 Other chronic pain: Secondary | ICD-10-CM | POA: Diagnosis not present

## 2019-02-08 DIAGNOSIS — L03115 Cellulitis of right lower limb: Secondary | ICD-10-CM | POA: Diagnosis not present

## 2019-02-08 DIAGNOSIS — S30850D Superficial foreign body of lower back and pelvis, subsequent encounter: Secondary | ICD-10-CM | POA: Diagnosis not present

## 2019-02-08 DIAGNOSIS — F329 Major depressive disorder, single episode, unspecified: Secondary | ICD-10-CM | POA: Diagnosis not present

## 2019-02-08 DIAGNOSIS — L89152 Pressure ulcer of sacral region, stage 2: Secondary | ICD-10-CM | POA: Diagnosis not present

## 2019-02-08 DIAGNOSIS — E039 Hypothyroidism, unspecified: Secondary | ICD-10-CM | POA: Diagnosis not present

## 2019-02-08 DIAGNOSIS — E1142 Type 2 diabetes mellitus with diabetic polyneuropathy: Secondary | ICD-10-CM | POA: Diagnosis not present

## 2019-02-08 DIAGNOSIS — E7800 Pure hypercholesterolemia, unspecified: Secondary | ICD-10-CM | POA: Diagnosis not present

## 2019-02-08 DIAGNOSIS — K219 Gastro-esophageal reflux disease without esophagitis: Secondary | ICD-10-CM | POA: Diagnosis not present

## 2019-02-20 DIAGNOSIS — L89313 Pressure ulcer of right buttock, stage 3: Secondary | ICD-10-CM | POA: Diagnosis not present

## 2019-03-04 DIAGNOSIS — Z6838 Body mass index (BMI) 38.0-38.9, adult: Secondary | ICD-10-CM | POA: Diagnosis not present

## 2019-03-04 DIAGNOSIS — L97909 Non-pressure chronic ulcer of unspecified part of unspecified lower leg with unspecified severity: Secondary | ICD-10-CM | POA: Diagnosis not present

## 2019-03-04 DIAGNOSIS — Z299 Encounter for prophylactic measures, unspecified: Secondary | ICD-10-CM | POA: Diagnosis not present

## 2019-03-04 DIAGNOSIS — M79604 Pain in right leg: Secondary | ICD-10-CM | POA: Diagnosis not present

## 2019-03-11 DIAGNOSIS — L97909 Non-pressure chronic ulcer of unspecified part of unspecified lower leg with unspecified severity: Secondary | ICD-10-CM | POA: Diagnosis not present

## 2019-03-11 DIAGNOSIS — Z6830 Body mass index (BMI) 30.0-30.9, adult: Secondary | ICD-10-CM | POA: Diagnosis not present

## 2019-03-11 DIAGNOSIS — I509 Heart failure, unspecified: Secondary | ICD-10-CM | POA: Diagnosis not present

## 2019-03-11 DIAGNOSIS — E1165 Type 2 diabetes mellitus with hyperglycemia: Secondary | ICD-10-CM | POA: Diagnosis not present

## 2019-03-11 DIAGNOSIS — Z299 Encounter for prophylactic measures, unspecified: Secondary | ICD-10-CM | POA: Diagnosis not present

## 2019-03-11 DIAGNOSIS — J449 Chronic obstructive pulmonary disease, unspecified: Secondary | ICD-10-CM | POA: Diagnosis not present

## 2019-03-11 NOTE — Progress Notes (Signed)
Virtual Visit via Telephone Note   This visit type was conducted due to national recommendations for restrictions regarding the COVID-19 Pandemic (e.g. social distancing) in an effort to limit this patient's exposure and mitigate transmission in our community.  Due to her co-morbid illnesses, this patient is at least at moderate risk for complications without adequate follow up.  This format is felt to be most appropriate for this patient at this time.  The patient did not have access to video technology/had technical difficulties with video requiring transitioning to audio format only (telephone).  All issues noted in this document were discussed and addressed.  No physical exam could be performed with this format.  Please refer to the patient's chart for her  consent to telehealth for [facility].   Date:  03/12/2019   ID:  [name] [name], DOB 12/24/1945, MRN [id number]  Patient Location: Home Provider Location: Office  PCP:  Glenda Chroman, MD  Cardiologist:  Rozann Lesches, MD Electrophysiologist:  None   Evaluation Performed:  Follow-Up Visit  Chief Complaint:  Cardiac follow-up  History of Present Illness:    [name] [name] is a 74 y.o. female last assessed via telehealth encounter in December 2020.  We spoke by phone today, I also talked with her daughter.  Overall no major change from a cardiac perspective, weight has fluctuated somewhat, but she has generally done well on Demadex 80 mg twice daily with intermittent intensification of dosing.  I reviewed her most recent lab work from January.  She does have reported cellulitis at this time and is on antibiotics per Dr. Woody Seller.  Home health nursing is checking in twice weekly.  I reviewed her medications which are outlined below.   Past Medical History:  Diagnosis Date  . Arthritis   . Carotid artery disease (HCC)    a. Bilateral CEA; 50-60% bilateral ICA stenosis, 11/12  . Cellulitis    a. Recurrent, bilateral legs  . Chronic  back pain   . Chronic diastolic heart failure ([place])   . Coronary atherosclerosis of native coronary artery    a. 11/2010 NSTEMI/CABG x 4: L-LAD; S-PL; S-OM; S-DX, by PVT.  [name] Critical lower limb ischemia   . Depression   . Essential hypertension   . Herniated disc   . History of blood transfusion   . Hypothyroidism   . Ischemic cardiomyopathy    a. 11/2010 TEE: EF 40-45%.  . Migraine   . Mixed hyperlipidemia   . NSTEMI (non-ST elevated myocardial infarction) ([place]) 11/2010  . On home oxygen therapy   . PAD (peripheral artery disease) ([place])   . Pleural effusion   . Pneumonia 2000's X 2  . Stroke ([place]) 08/2009  . Suicide attempt ([place]) 08/2002  . TIA (transient ischemic attack)   . TMJ syndrome   . Type 2 diabetes mellitus ([place])   . Venous insufficiency    Past Surgical History:  Procedure Laterality Date  . ABDOMINAL HYSTERECTOMY    . AMPUTATION Left 01/29/2018   Procedure: AMPUTATION ABOVE KNEE;  Surgeon: Angelia Mould, MD;  Location: [place];  Service: Vascular;  Laterality: Left;  . APPENDECTOMY    . CARDIAC CATHETERIZATION  11/2010  . CARDIAC CATHETERIZATION N/A 09/16/2015   Procedure: Right Heart Cath;  Surgeon: Larey Dresser, MD;  Location: [place] [facility];  Service: Cardiovascular;  Laterality: N/A;  . CAROTID ENDARTERECTOMY Bilateral 2011   Bilateral - Dr. Kellie Simmering  . CHOLECYSTECTOMY    . COLONOSCOPY W/ BIOPSIES  AND POLYPECTOMY    . CORONARY ANGIOPLASTY WITH STENT PLACEMENT  03/2013   "1"  . CORONARY ARTERY BYPASS GRAFT  11/24/2010   Procedure: CORONARY ARTERY BYPASS GRAFTING (CABG);  Surgeon: Tharon Aquas Adelene Idler, MD;  Location: [place];  Service: Open Heart Surgery;  Laterality: N/A;  Coronary Artery Bypass Graft times four on pump utilizing left internal mammary artery and bilateral greater saphenous veins harvested endoscopically, transesophageal echocardiogram   . DEBRIDEMENT TOE Left    "nonhealing wound; 3rd digit  . DILATION AND CURETTAGE OF UTERUS    .  ENDARTERECTOMY POPLITEAL Left 10/07/2013   Procedure: LEFT POPLITEAL ENDARTERECTOMY ;  Surgeon: Angelia Mould, MD;  Location: [place];  Service: Vascular;  Laterality: Left;  . FEMORAL-POPLITEAL BYPASS GRAFT Left 10/07/2013   Procedure: LEFT FEMORAL-POPLITEAL ARTERY BYPASS GRAFT;  Surgeon: Angelia Mould, MD;  Location: [place];  Service: Vascular;  Laterality: Left;  . FRACTURE SURGERY    . INTRAOPERATIVE ARTERIOGRAM Left 10/07/2013   Procedure: INTRA OPERATIVE ARTERIOGRAM LEFT LEG;  Surgeon: Angelia Mould, MD;  Location: [place];  Service: Vascular;  Laterality: Left;  . IR GENERIC HISTORICAL  09/21/2015   IR REMOVAL OF PLURAL CATH W/CUFF 09/21/2015 Marybelle Killings, MD [facility]-INTERV RAD  . LEFT AND RIGHT HEART CATHETERIZATION WITH CORONARY/GRAFT ANGIOGRAM N/A 03/27/2013   Procedure: LEFT AND RIGHT HEART CATHETERIZATION WITH Beatrix Fetters;  Surgeon: Burnell Blanks, MD;  Location: [facility] CATH LAB;  Service: Cardiovascular;  Laterality: N/A;  . LOWER EXTREMITY ANGIOGRAM  09/08/2012; 10/06/2013   "found 100% blockage; unsuccessful attempt at crossing a chronic total occlusion of the left SFA in the setting of critical limb ischemia  . LOWER EXTREMITY ANGIOGRAM Bilateral 09/08/2013   Procedure: LOWER EXTREMITY ANGIOGRAM;  Surgeon: [name], MD;  Location: [facility] CATH LAB;  Service: Cardiovascular;  Laterality: Bilateral;  . PERIPHERAL VASCULAR CATHETERIZATION  11/04/2015   Procedure: Peripheral Vascular Atherectomy;  Surgeon: [name], MD;  Location: [place] [facility];  Service: Cardiovascular;;  RT. SFA  . PERIPHERAL VASCULAR CATHETERIZATION Bilateral 11/04/2015   Procedure: Lower Extremity Intervention;  Surgeon: [name], MD;  Location: [place] [facility];  Service: Cardiovascular;  Laterality: Bilateral;  . PERIPHERAL VASCULAR CATHETERIZATION  11/04/2015   Procedure: Peripheral Vascular Balloon Angioplasty;  Surgeon: [name], MD;  Location: [place] [facility];  Service: Cardiovascular;;  TP Trunk  . TOE SURGERY Left    "put pin in 2nd toe"  . TUBAL LIGATION    . WRIST FRACTURE SURGERY Left    "grafted bone from hip to wrist"     Current Meds  Medication Sig  . acetaminophen (TYLENOL) 500 MG tablet Take 500 mg by mouth every 6 (six) hours as needed for mild pain or moderate pain.   [name] amoxicillin-clavulanate (AUGMENTIN) 875-125 MG tablet Take 1 tablet by mouth 2 (two) times daily.  [name] aspirin 81 MG EC tablet Take 81 mg by mouth daily.  [name] atorvastatin (LIPITOR) 40 MG tablet TAKE 1 TABLET BY MOUTH ONCE DAILY AT  6  PM (Patient taking differently: Take 40 mg by mouth daily. )  . docusate sodium (COLACE) 100 MG capsule Take 200 mg by mouth at bedtime as needed for mild constipation.  . Dulaglutide (TRULICITY) 0.37 CW/8.8QB SOPN Inject into the skin once a week.  . DULoxetine (CYMBALTA) 60 MG capsule Take 60 mg by mouth daily.   [name] gabapentin (NEURONTIN) 400 MG capsule Take 400 mg by mouth 3 (three) times  daily.  . insulin detemir (LEVEMIR) 100 UNIT/ML injection 18 units every morning & 14 units every evening  . ipratropium-albuterol (DUONEB) 0.5-2.5 (3) MG/3ML SOLN Take 3 mLs by nebulization as needed.  . Lactobacillus (PROBIOTIC ACIDOPHILUS PO) Take 1 capsule by mouth daily.  [name] levothyroxine (SYNTHROID, LEVOTHROID) 75 MCG tablet Take 1 tablet (75 mcg total) by mouth daily before breakfast.  . linagliptin (TRADJENTA) 5 MG TABS tablet Take 5 mg by mouth daily.  . mirtazapine (REMERON) 15 MG tablet Take 15 mg by mouth at bedtime.  . Multiple Vitamin (MULTIVITAMIN WITH MINERALS) TABS tablet Take 1 tablet by mouth daily.  . nitroGLYCERIN (NITROSTAT) 0.4 MG SL tablet Place 1 tablet (0.4 mg total) under the tongue every 5 (five) minutes as needed for chest pain. Up to 3 doses. If no relief after 3rd dose, proceed to the ED for an evaluation  . nystatin cream (MYCOSTATIN) Apply 1 application topically 2 (two) times daily as needed.   [name]  oxyCODONE (OXY IR/ROXICODONE) 5 MG immediate release tablet Take 1 tablet (5 mg total) by mouth 4 (four) times daily as needed for severe pain.  . pantoprazole (PROTONIX) 40 MG tablet Take 40 mg by mouth daily.  . potassium chloride SA (KLOR-CON) 20 MEQ tablet Take 1 tablet (20 mEq total) by mouth 2 (two) times daily.  [name] torsemide (DEMADEX) 20 MG tablet Take 4 tablets (80 mg total) by mouth 2 (two) times daily.     Allergies:   Other, Strawberry extract, Sulfa antibiotics, Coffee bean extract [coffea arabica], and Tape   ROS:   No new symptoms reported.  Prior CV studies:   The following studies were reviewed today:  Echocardiogram 08/21/2017: Study Conclusions  - Left ventricle: The cavity size was normal. Wall thickness was increased in a pattern of moderate to severe LVH. Systolic function was normal. The estimated ejection fraction was in the range of 60% to 65%. Wall motion was normal; there were no regional wall motion abnormalities. Doppler parameters are consistent with restrictive physiology, indicative of decreased left ventricular diastolic compliance and/or increased left atrial pressure. Doppler parameters are consistent with high ventricular filling pressure. - Aortic valve: Valve area (VTI): 1.95 cm^2. Valve area (Vmax): 1.73 cm^2. Valve area (Vmean): 1.83 cm^2. - Mitral valve: Mildly calcified annulus. Mildly thickened leaflets. - Left atrium: The atrium was severely dilated. - Right ventricle: The ventricular septum is flattend in diastole suggesting RV volume overload. The cavity size was moderately dilated. Systolic function was mildly reduced. - Right atrium: The atrium was mildly dilated. - Tricuspid valve: There was moderate regurgitation. The TR is eccentric and may be underestimated - Pulmonary arteries: Systolic pressure was moderately increased. PA peak pressure: 55 mm Hg (S). - Technically adequate study.  Labs/Other Tests  and Data Reviewed:    EKG:  An ECG dated 01/28/2018 was personally reviewed today and demonstrated:  Sinus rhythm with PVC and nonspecific ST-T changes.  Recent Labs:  January 2021: BUN 51, creatinine 1.07, AST 28, ALT 11, potassium 4.2, hemoglobin 11.5, platelets 151  Wt Readings from Last 3 Encounters:  09/26/18 197 lb 8 oz (89.6 kg)  02/26/18 159 lb (72.1 kg)  02/20/18 195 lb (88.5 kg)     Objective:    Vital Signs:  Ht _0  (1.676 m)   BMI 31.88 kg/m    Patient not able to get up and weigh regularly. I spoke predominantly with the patient's daughter [name] [name]. Ms. Bowler did not voice any concerns or new questions.  ASSESSMENT & PLAN:    1.  Chronic diastolic heart failure with RV dysfunction and pulmonary hypertension.  She has been relatively stable on current dose of Demadex 80 mg twice daily with potassium supplements.  We will follow up BMET prior to her next encounter.  2.  Multivessel CAD status post CABG and graft intervention.  She continues on aspirin and Lipitor.  No angina reported with sedentary status.  3.  CKD stage IIIb by history.  Follow-up lab work most recently showed creatinine 1.07 and normal potassium.   Time:   Today, I have spent 5 minutes with the patient with telehealth technology discussing the above problems.     Medication Adjustments/Labs and Tests Ordered: Current medicines are reviewed at length with the patient today.  Concerns regarding medicines are outlined above.   Tests Ordered: Orders Placed This Encounter  Procedures  . Basic metabolic panel    Medication Changes: No orders of the defined types were placed in this encounter.   Follow Up:  Virtual Visit  2 months.  Signed, Rozann Lesches, MD  03/12/2019 12:24 PM    [name] [name]

## 2019-03-12 ENCOUNTER — Telehealth (INDEPENDENT_AMBULATORY_CARE_PROVIDER_SITE_OTHER): Payer: Medicare Other | Admitting: Cardiology

## 2019-03-12 ENCOUNTER — Encounter: Payer: Self-pay | Admitting: Cardiology

## 2019-03-12 VITALS — Ht 66.0 in

## 2019-03-12 DIAGNOSIS — N1832 Chronic kidney disease, stage 3b: Secondary | ICD-10-CM

## 2019-03-12 DIAGNOSIS — I5032 Chronic diastolic (congestive) heart failure: Secondary | ICD-10-CM | POA: Diagnosis not present

## 2019-03-12 DIAGNOSIS — I25119 Atherosclerotic heart disease of native coronary artery with unspecified angina pectoris: Secondary | ICD-10-CM | POA: Diagnosis not present

## 2019-03-12 NOTE — Patient Instructions (Addendum)
Medication Instructions:   Your physician recommends that you continue on your current medications as directed. Please refer to the Current Medication list given to you today.  Labwork:  Your physician recommends that you return for lab work in: 2 months to check your BMET. Please have home health to draw.  Testing/Procedures:  NONE  Follow-Up:  Your physician recommends that you schedule a follow-up appointment in: 2 months (virtual)  Any Other Special Instructions Will Be Listed Below (If Applicable).  If you need a refill on your cardiac medications before your next appointment, please call your pharmacy.

## 2019-03-31 DIAGNOSIS — I272 Pulmonary hypertension, unspecified: Secondary | ICD-10-CM | POA: Diagnosis present

## 2019-03-31 DIAGNOSIS — E11649 Type 2 diabetes mellitus with hypoglycemia without coma: Secondary | ICD-10-CM | POA: Diagnosis present

## 2019-03-31 DIAGNOSIS — Z66 Do not resuscitate: Secondary | ICD-10-CM | POA: Diagnosis not present

## 2019-03-31 DIAGNOSIS — F330 Major depressive disorder, recurrent, mild: Secondary | ICD-10-CM | POA: Diagnosis not present

## 2019-03-31 DIAGNOSIS — R0602 Shortness of breath: Secondary | ICD-10-CM | POA: Diagnosis not present

## 2019-03-31 DIAGNOSIS — E785 Hyperlipidemia, unspecified: Secondary | ICD-10-CM | POA: Diagnosis not present

## 2019-03-31 DIAGNOSIS — Z299 Encounter for prophylactic measures, unspecified: Secondary | ICD-10-CM | POA: Diagnosis not present

## 2019-03-31 DIAGNOSIS — S81801A Unspecified open wound, right lower leg, initial encounter: Secondary | ICD-10-CM | POA: Diagnosis present

## 2019-03-31 DIAGNOSIS — N179 Acute kidney failure, unspecified: Secondary | ICD-10-CM | POA: Diagnosis present

## 2019-03-31 DIAGNOSIS — A0471 Enterocolitis due to Clostridium difficile, recurrent: Secondary | ICD-10-CM | POA: Diagnosis not present

## 2019-03-31 DIAGNOSIS — R404 Transient alteration of awareness: Secondary | ICD-10-CM | POA: Diagnosis not present

## 2019-03-31 DIAGNOSIS — I2510 Atherosclerotic heart disease of native coronary artery without angina pectoris: Secondary | ICD-10-CM | POA: Diagnosis present

## 2019-03-31 DIAGNOSIS — I509 Heart failure, unspecified: Secondary | ICD-10-CM | POA: Diagnosis not present

## 2019-03-31 DIAGNOSIS — A419 Sepsis, unspecified organism: Secondary | ICD-10-CM | POA: Diagnosis not present

## 2019-03-31 DIAGNOSIS — R4020 Unspecified coma: Secondary | ICD-10-CM | POA: Diagnosis not present

## 2019-03-31 DIAGNOSIS — Z20822 Contact with and (suspected) exposure to covid-19: Secondary | ICD-10-CM | POA: Diagnosis not present

## 2019-03-31 DIAGNOSIS — R069 Unspecified abnormalities of breathing: Secondary | ICD-10-CM | POA: Diagnosis not present

## 2019-03-31 DIAGNOSIS — R4182 Altered mental status, unspecified: Secondary | ICD-10-CM | POA: Diagnosis not present

## 2019-03-31 DIAGNOSIS — I2723 Pulmonary hypertension due to lung diseases and hypoxia: Secondary | ICD-10-CM | POA: Diagnosis not present

## 2019-03-31 DIAGNOSIS — Z7401 Bed confinement status: Secondary | ICD-10-CM | POA: Diagnosis not present

## 2019-03-31 DIAGNOSIS — Z951 Presence of aortocoronary bypass graft: Secondary | ICD-10-CM | POA: Diagnosis not present

## 2019-03-31 DIAGNOSIS — E875 Hyperkalemia: Secondary | ICD-10-CM | POA: Diagnosis not present

## 2019-03-31 DIAGNOSIS — R0689 Other abnormalities of breathing: Secondary | ICD-10-CM | POA: Diagnosis not present

## 2019-03-31 DIAGNOSIS — I252 Old myocardial infarction: Secondary | ICD-10-CM | POA: Diagnosis not present

## 2019-03-31 DIAGNOSIS — E118 Type 2 diabetes mellitus with unspecified complications: Secondary | ICD-10-CM | POA: Diagnosis not present

## 2019-03-31 DIAGNOSIS — Z8673 Personal history of transient ischemic attack (TIA), and cerebral infarction without residual deficits: Secondary | ICD-10-CM | POA: Diagnosis not present

## 2019-03-31 DIAGNOSIS — N390 Urinary tract infection, site not specified: Secondary | ICD-10-CM | POA: Diagnosis not present

## 2019-03-31 DIAGNOSIS — N189 Chronic kidney disease, unspecified: Secondary | ICD-10-CM | POA: Diagnosis not present

## 2019-03-31 DIAGNOSIS — Z8616 Personal history of COVID-19: Secondary | ICD-10-CM | POA: Diagnosis not present

## 2019-03-31 DIAGNOSIS — R569 Unspecified convulsions: Secondary | ICD-10-CM | POA: Diagnosis present

## 2019-03-31 DIAGNOSIS — D649 Anemia, unspecified: Secondary | ICD-10-CM | POA: Diagnosis present

## 2019-03-31 DIAGNOSIS — J9601 Acute respiratory failure with hypoxia: Secondary | ICD-10-CM | POA: Diagnosis not present

## 2019-03-31 DIAGNOSIS — Z87891 Personal history of nicotine dependence: Secondary | ICD-10-CM | POA: Diagnosis not present

## 2019-03-31 DIAGNOSIS — I2101 ST elevation (STEMI) myocardial infarction involving left main coronary artery: Secondary | ICD-10-CM | POA: Diagnosis not present

## 2019-03-31 DIAGNOSIS — J90 Pleural effusion, not elsewhere classified: Secondary | ICD-10-CM | POA: Diagnosis not present

## 2019-03-31 DIAGNOSIS — R0902 Hypoxemia: Secondary | ICD-10-CM | POA: Diagnosis not present

## 2019-03-31 DIAGNOSIS — R402432 Glasgow coma scale score 3-8, at arrival to emergency department: Secondary | ICD-10-CM | POA: Diagnosis present

## 2019-03-31 DIAGNOSIS — Z794 Long term (current) use of insulin: Secondary | ICD-10-CM | POA: Diagnosis not present

## 2019-03-31 DIAGNOSIS — I639 Cerebral infarction, unspecified: Secondary | ICD-10-CM | POA: Diagnosis not present

## 2019-03-31 DIAGNOSIS — Z7902 Long term (current) use of antithrombotics/antiplatelets: Secondary | ICD-10-CM | POA: Diagnosis not present

## 2019-03-31 DIAGNOSIS — Z7982 Long term (current) use of aspirin: Secondary | ICD-10-CM | POA: Diagnosis not present

## 2019-03-31 DIAGNOSIS — J80 Acute respiratory distress syndrome: Secondary | ICD-10-CM | POA: Diagnosis not present

## 2019-04-10 DEATH — deceased

## 2019-05-19 ENCOUNTER — Telehealth: Payer: Medicare Other | Admitting: Cardiology
